# Patient Record
Sex: Male | Born: 1945 | Race: Black or African American | Hispanic: No
Health system: Western US, Academic
[De-identification: ages and names within clinical notes are randomized; demographics above are authoritative.]

## PROBLEM LIST (undated history)

## (undated) DIAGNOSIS — R319 Hematuria, unspecified: Secondary | ICD-10-CM

## (undated) DIAGNOSIS — G935 Compression of brain: Secondary | ICD-10-CM

## (undated) DIAGNOSIS — M19042 Primary osteoarthritis, left hand: Secondary | ICD-10-CM

## (undated) DIAGNOSIS — I10 Essential (primary) hypertension: Secondary | ICD-10-CM

## (undated) DIAGNOSIS — J45909 Unspecified asthma, uncomplicated: Secondary | ICD-10-CM

## (undated) DIAGNOSIS — M19041 Primary osteoarthritis, right hand: Secondary | ICD-10-CM

## (undated) DIAGNOSIS — J301 Allergic rhinitis due to pollen: Secondary | ICD-10-CM

## (undated) HISTORY — PX: PR TYMPANOSTOMY GENERAL ANESTHESIA: 69436

## (undated) HISTORY — DX: Primary osteoarthritis, right hand: M19.041

## (undated) HISTORY — PX: ROTATOR CUFF REPAIR: SHX139

## (undated) HISTORY — DX: Primary osteoarthritis, left hand: M19.042

## (undated) HISTORY — PX: BACK SURGERY: SHX140

## (undated) HISTORY — DX: Allergic rhinitis due to pollen: J30.1

## (undated) HISTORY — PX: SPINE SURGERY: SHX786

## (undated) HISTORY — DX: Compression of brain: G93.5

## (undated) NOTE — *Deleted (*Deleted)
Denies pain assisted to BR x3 with staff and the assistance of a gait belt, unsteady gait noted, Patient verbalize frustration with a system that takes "all his 'dignity away even when he doesn't want them to" His thoughts are confusing and rambling on current and pass occurrences that he experience throughout life,then replies no one will listen to him".Reassurance provided and support. . Patient informed me that he has nor had a bowel movement in 8-9 days and request something for this concern,writer did inform this patient that it is recorded a bowel moment on 04/08/20, but he denies this and po prune juice provided, He was eventually assisted back to bed with the assistance of staff x2( assigned nurse and NT),HOB elevated for his comfort, touch call bell placed on his chest per request, and TV turned on per his request, SR up x3,  0150 Patient assisted x2 back to BR

---

## 2008-09-30 ENCOUNTER — Observation Stay (HOSPITAL_COMMUNITY): Admission: EM | Admit: 2008-09-30 | Discharge: 2008-10-02 | Payer: Self-pay | Admitting: Emergency Medicine

## 2008-09-30 IMAGING — CR DG HAND COMPLETE 3+V*L*
3 series · 3 of 3 positions shown · non-contrast
Comparison: None.

CLINICAL DATA: Injury to hand while using lawnmower.  Fever.

LEFT HAND - COMPLETE 3+ VIEW

[view not recorded (1 of 3)]
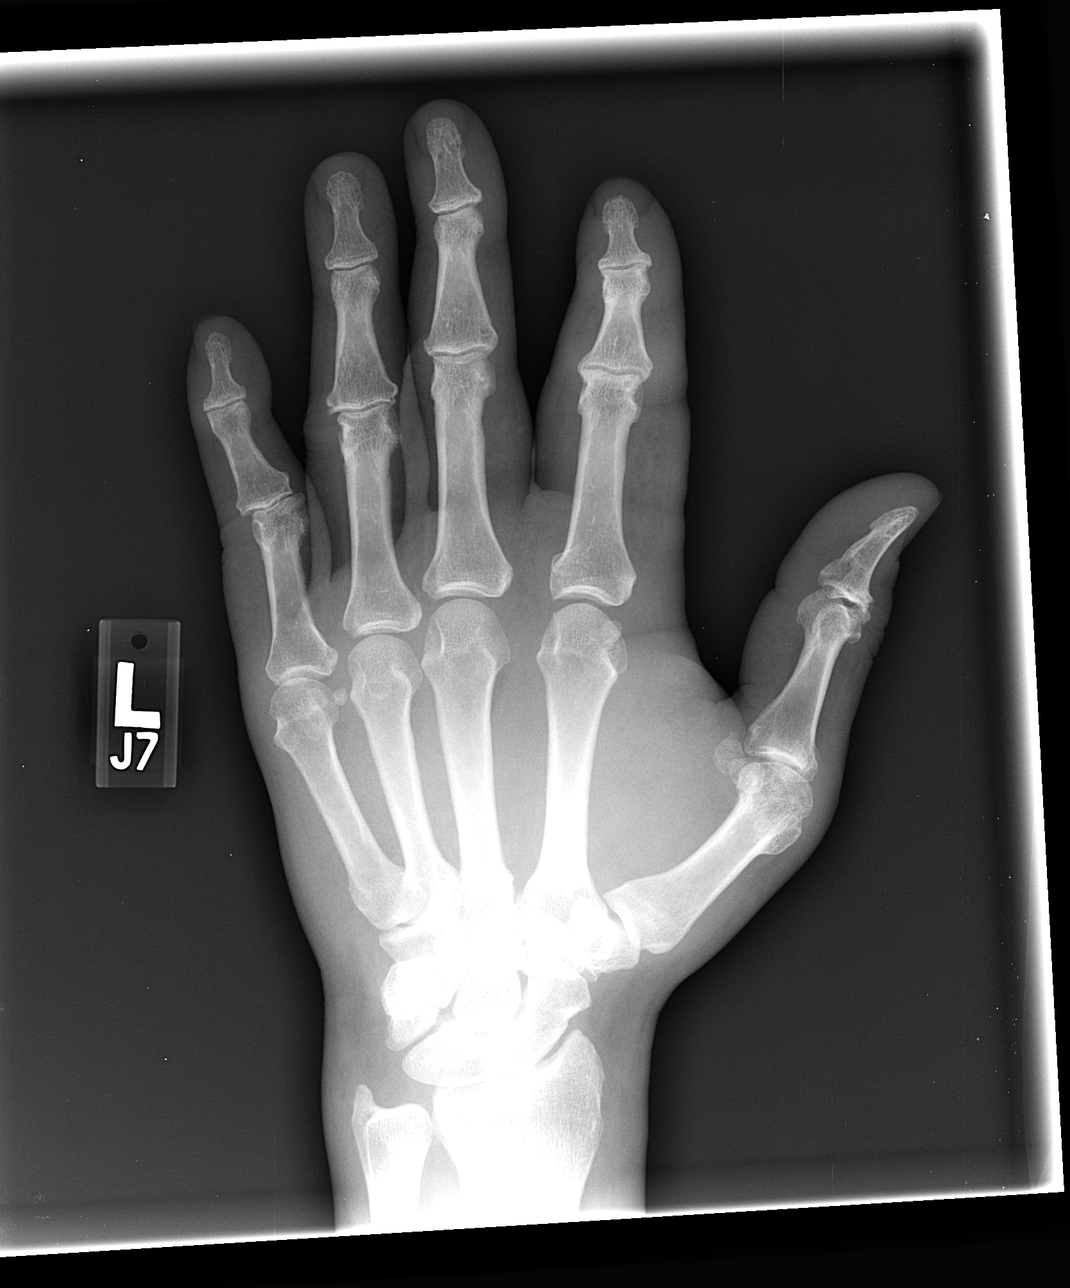

[view not recorded (2 of 3)]
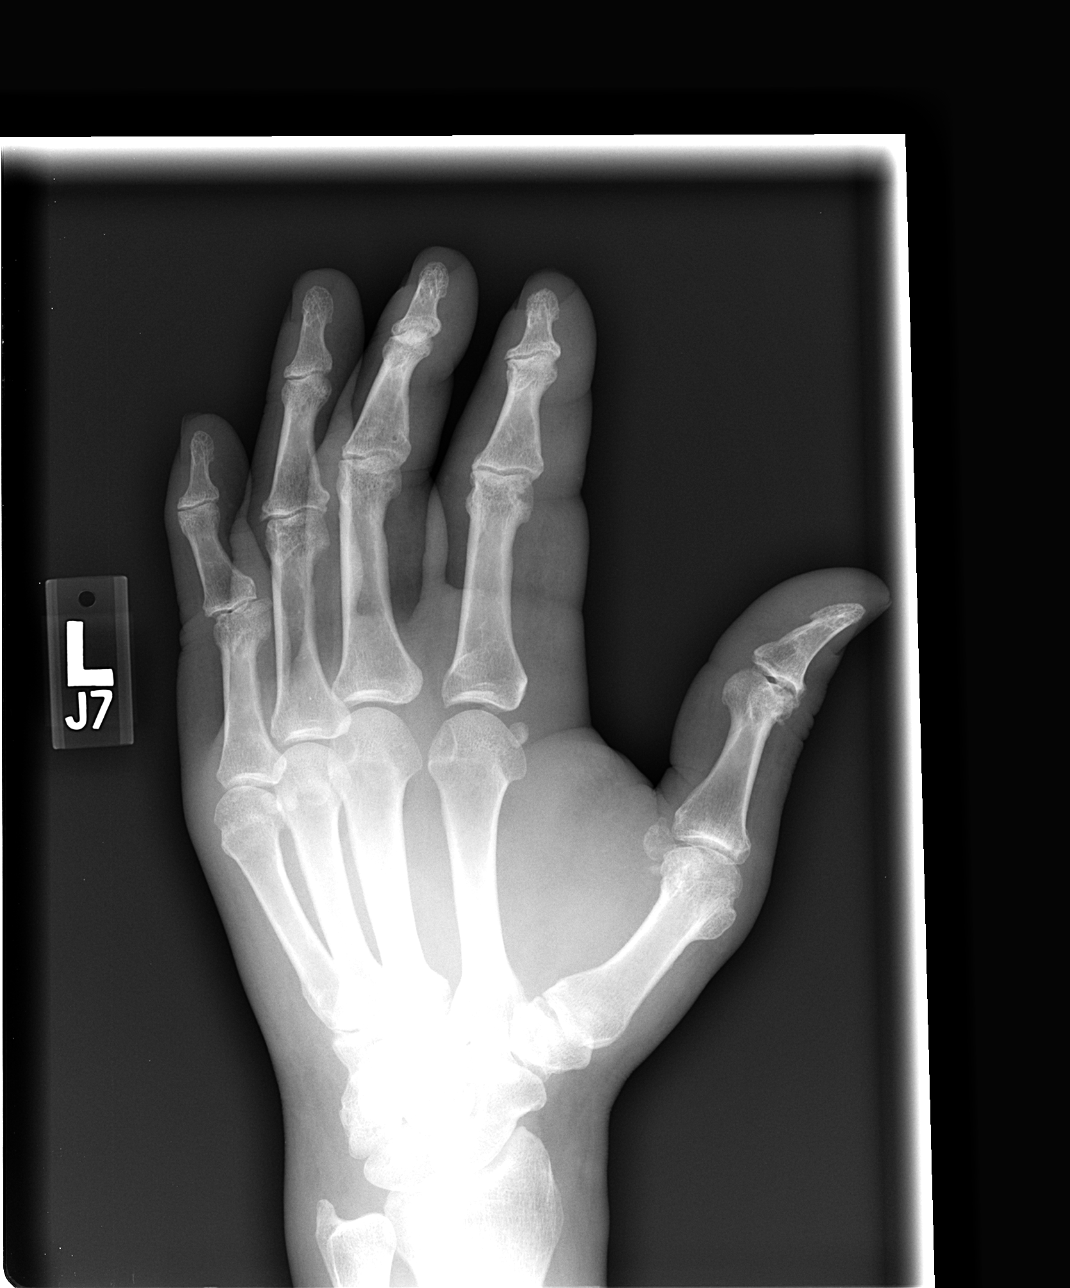

[view not recorded (3 of 3)]
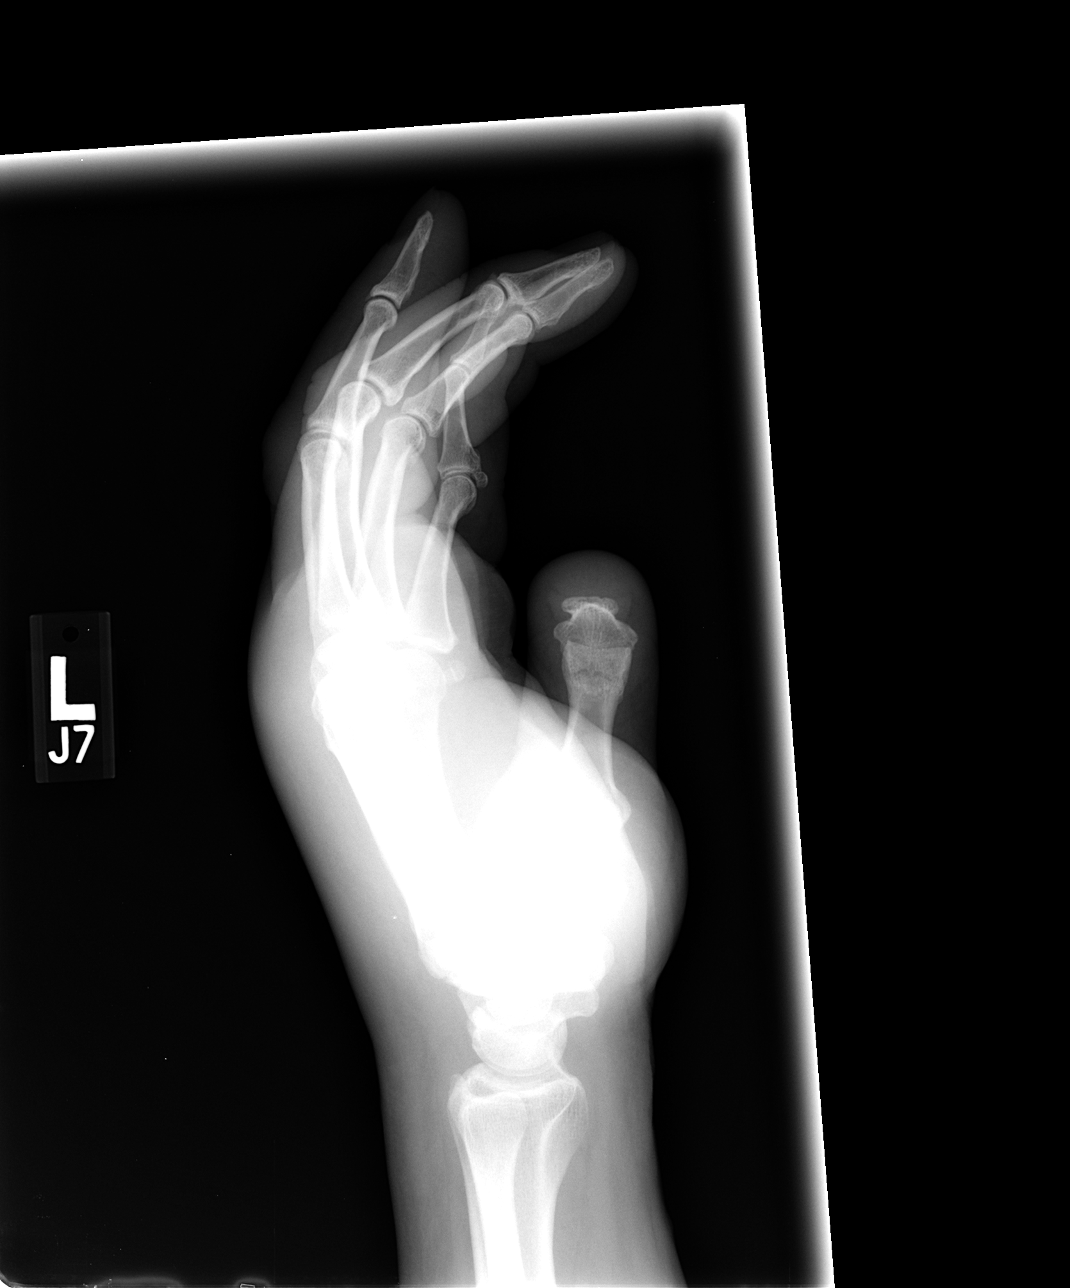

[3 of 3 positions shown; findings below may reference images not displayed]

FINDINGS: Prominent soft tissue swelling dorsum of the hand and
left second digit.  No radiopaque foreign body (radiopaque
structures on various films appear to be artifact and did not
persist on the old films).  Minimal cortical irregularity at the
base of the left second proximal phalanx may represent bony spur
rather than fracture.  If the patient were tender here, a subtle
fracture could not be excluded. Scattered degenerative changes.
IMPRESSION: Prominent soft tissue swelling consistent with cellulitis without
plain film evidence of osteomyelitis.

Subtle cortical irregularity base of the left second proximal
phalanx may represent bony spur rather than fracture.  Question the
patient is tender here?

## 2008-09-30 IMAGING — CR DG CHEST 1V
1 series · 1 of 1 positions shown · non-contrast
Comparison: None.

CLINICAL DATA: Preoperative exam. Hand injury.

CHEST - 1 VIEW

[view not recorded]
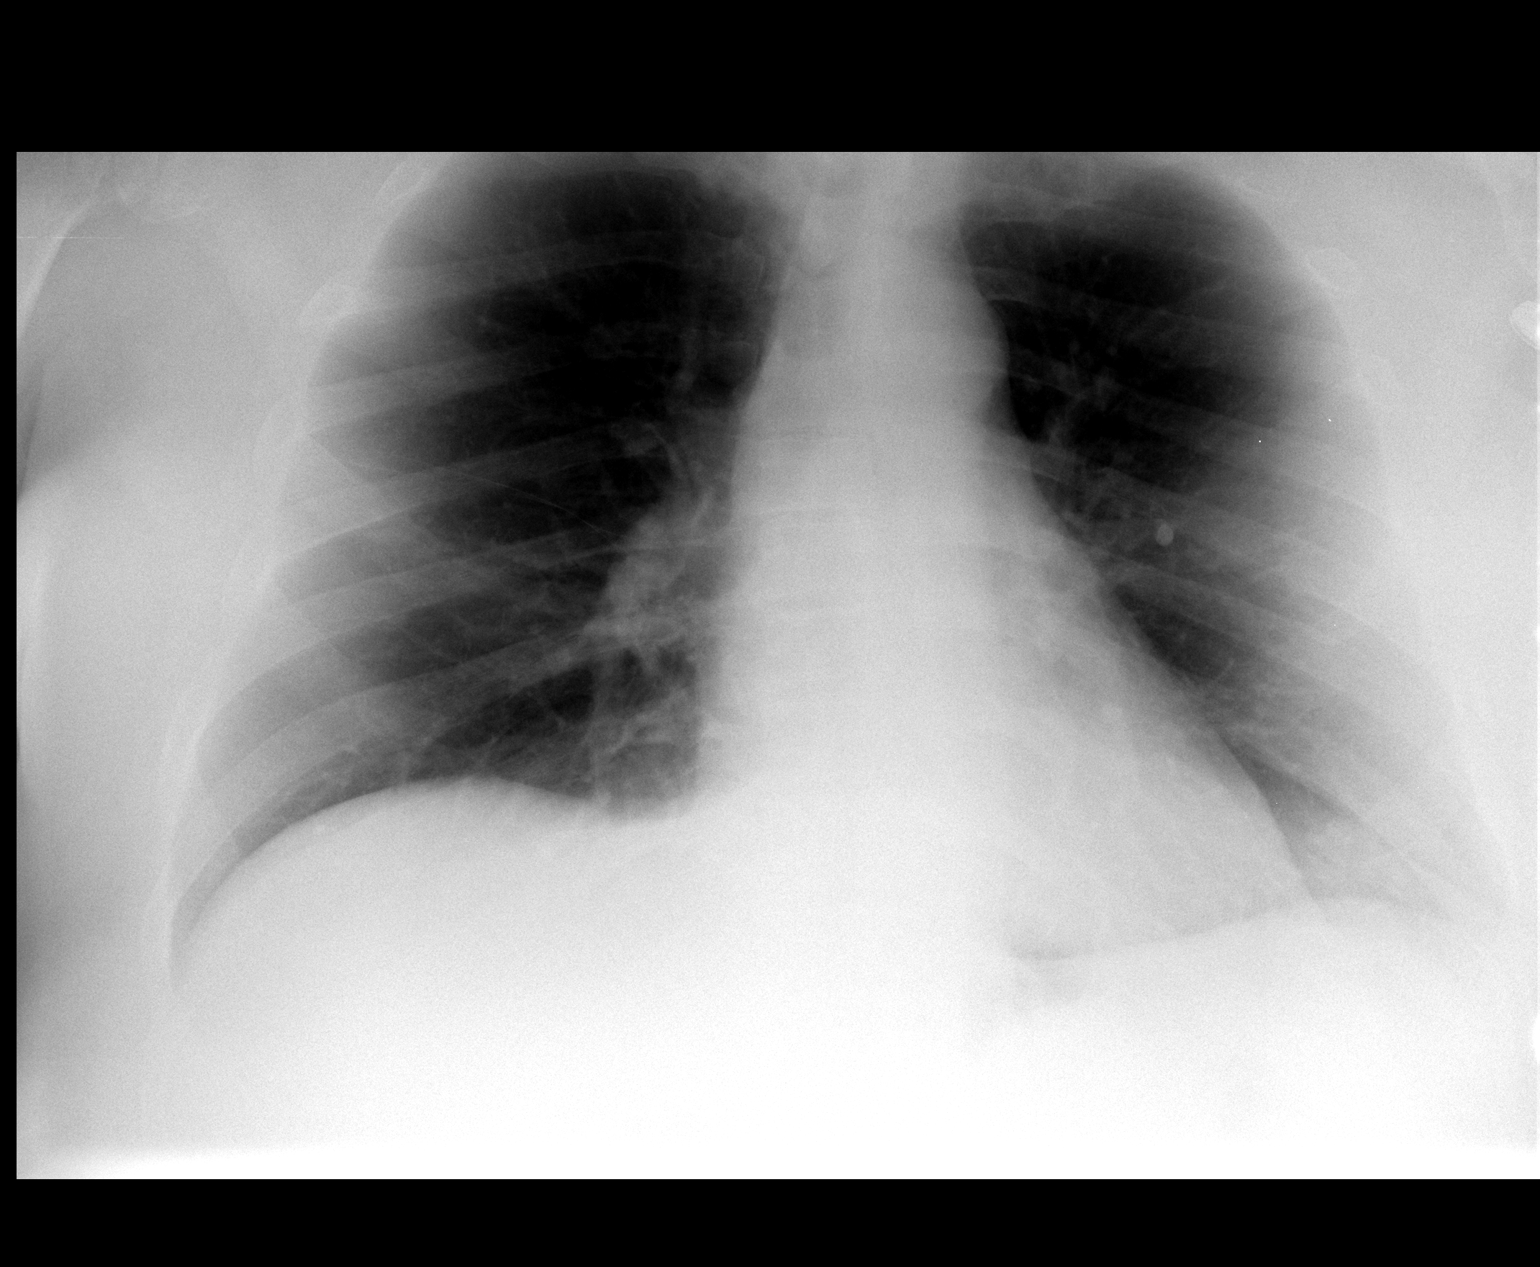

[1 of 1 positions shown; findings below may reference images not displayed]

FINDINGS: Exam limited by portable technique and patient's habitus.
Left base opacity may represent atelectasis or prominent fat pad
and can be evaluated on follow-up to exclude underlying infiltrate
or mass.  Otherwise no infiltrate, congestive heart failure or
pneumothorax.  Heart size within normal limits.
IMPRESSION: Opacity left base.  It is possible this represents confluence of
shadows, atelectasis or prominent fat pad but will require follow-
up to exclude underlying mass or infiltrate.

## 2010-06-24 HISTORY — PX: INCISION AND DRAINAGE: SHX5863

## 2010-10-03 LAB — DIFFERENTIAL
Basophils Absolute: 0 10*3/uL (ref 0.0–0.1)
Basophils Relative: 0 % (ref 0–1)
Eosinophils Absolute: 0 10*3/uL (ref 0.0–0.7)
Eosinophils Relative: 0 % (ref 0–5)
Lymphocytes Relative: 5 % — ABNORMAL LOW (ref 12–46)
Lymphs Abs: 0.7 10*3/uL (ref 0.7–4.0)
Monocytes Absolute: 1.5 10*3/uL — ABNORMAL HIGH (ref 0.1–1.0)
Monocytes Relative: 11 % (ref 3–12)
Neutro Abs: 11.8 10*3/uL — ABNORMAL HIGH (ref 1.7–7.7)
Neutrophils Relative %: 84 % — ABNORMAL HIGH (ref 43–77)

## 2010-10-03 LAB — BASIC METABOLIC PANEL
BUN: 12 mg/dL (ref 6–23)
CO2: 18 mEq/L — ABNORMAL LOW (ref 19–32)
Calcium: 8.6 mg/dL (ref 8.4–10.5)
Chloride: 106 mEq/L (ref 96–112)
Creatinine, Ser: 1.2 mg/dL (ref 0.4–1.5)
GFR calc Af Amer: >60 mL/min (ref >60)
GFR calc non Af Amer: >60 mL/min (ref >60)
Glucose, Bld: 145 mg/dL — ABNORMAL HIGH (ref 70–99)
Potassium: 3.9 mEq/L (ref 3.5–5.1)
Sodium: 134 mEq/L — ABNORMAL LOW (ref 135–145)

## 2010-10-03 LAB — WOUND CULTURE

## 2010-10-03 LAB — GRAM STAIN

## 2010-10-03 LAB — ANAEROBIC CULTURE

## 2010-10-03 LAB — CULTURE, BLOOD (ROUTINE X 2)
Culture: NO GROWTH
Culture: NO GROWTH

## 2010-10-03 LAB — CBC
HCT: 45.1 % (ref 39.0–52.0)
Hemoglobin: 15.4 g/dL (ref 13.0–17.0)
MCHC: 34.1 g/dL (ref 30.0–36.0)
MCV: 83.2 fL (ref 78.0–100.0)
Platelets: 147 10*3/uL — ABNORMAL LOW (ref 150–400)
RBC: 5.43 MIL/uL (ref 4.22–5.81)
RDW: 13.3 % (ref 11.5–15.5)
WBC: 13.9 10*3/uL — ABNORMAL HIGH (ref 4.0–10.5)

## 2010-11-06 NOTE — Discharge Summary (Signed)
Shannon Shannon [name], Shannon [name]                ACCOUNT NO.:  [id number]   MEDICAL RECORD NO.:  [id number]          PATIENT TYPE:  INP   LOCATION:  5118                         FACILITY:  [facility]   PHYSICIAN:  Johnette Abraham, MD    DATE OF BIRTH:  Sep 09, 1945   DATE OF ADMISSION:  09/30/2008  DATE OF DISCHARGE:  10/02/2008                               DISCHARGE SUMMARY   ADMITTING DIAGNOSIS:  Infection of the left index finger and left hand.   TREATMENT:  On September 30, 2008, the patient underwent incision and  drainage of infection of the left index finger and left hand.   DISCHARGE CONDITION:  Stable.   DISCHARGE MEDICATIONS:  Bactrim double strength 1 tablet p.o. b.i.d. for  7 days.   REASON FOR HOSPITALIZATION AND HOSPITAL COURSE:  Shannon Shannon [name] is a  pleasant gentleman who cut his left index finger on the lawn mower on  Wednesday of the current week.  He presented to the emergency department  on Friday with signs and symptoms of severe swollen hand and evidence of  infection of the left index finger and hand.  He was taken to the  operating room where incision and drainage was performed.  Please see  the operative note for details.  Postoperatively, he was admitted to the  floor for IV antibiotics and wound care.  He underwent dressing changes  with irrigation of the wound and packing with iodoform gauze.  On the  first postoperative day, the hand was still quite edematous with  evidence of continued cellulitis and some purulent drainage.  Dressing  changes were continued.  Over the second postoperative day, he remained  afebrile.  His swelling and erythema had markedly regressed with  elevation, IV antibiotics, and wound care.  Of note, he was placed on  Zosyn for broad-spectrum coverage until cultures had been clearly  isolated and identified.  Initial culture result showed Gram-positive  cocci in pairs and clusters.  Subsequent microbiology showed staph  aureus.  At the time of discharge,  it was unclear whether this was  sensitive or resistant staph aureus.  On the day of discharge, he was  afebrile.  His vitals were stable.  He had complained of no pain in the  left hand and index finger.  Upon evaluating the wound, the distal  incision was nearly closed with no evidence of cellulitis.  The incision  over the volar PIP area still showed some signs of drainage and some  erythema.  The more proximal incision overlying the palm showed  regression of erythema and swelling with minimal drainage.  All wounds  were cleansed and irrigated and repacked with iodoform gauze.  The  patient was given explicit directions for wound care to allow him to be  discharged.   DISCHARGE INFORMATION:  The patient will irrigate the wound with normal  saline solution and use half strength hydrogen peroxide at least twice a  day.  He will then pack each wound with a very small amount of iodoform  gauze and cover the wound and keep it covered.  He should also  elevate  the extremity to help with the swelling.  He is to maintain vigilance  when evaluating the wound.  Further erythema, warmth, increase and  drainage should warrant a prompt call to my office.  He is given a  prescription for Bactrim double strength 1 tablet p.o. b.i.d. to take.  He will be discharged after his second dose of IV Zosyn today.  He is to  continue a heart healthy diet.  He states that he has essentially no  hand pain and therefore will continue his Aleve for pain control as he  was on  previously.  He is not currently taking any other medications.  Activity  is as tolerated with elevation of the left upper extremity and wound  care.  I will see him in the office the first of next week.  He is given  instructions to call for a specific time.      Johnette Abraham, MD  Electronically Signed     HCC/MEDQ  D:  10/02/2008  T:  10/03/2008  Job:  [id number]

## 2010-11-06 NOTE — Op Note (Signed)
NAMEJORGELUIS, Shannon [name]                ACCOUNT NO.:  [id number]   MEDICAL RECORD NO.:  [id number]          PATIENT TYPE:  INP   LOCATION:  2550                         FACILITY:  [facility]   PHYSICIAN:  Johnette Abraham, MD    DATE OF BIRTH:  Apr 01, 1946   DATE OF PROCEDURE:  09/30/2008  DATE OF DISCHARGE:                               OPERATIVE REPORT   PREOPERATIVE DIAGNOSIS:  Infection, left index finger and left hand,  flexor tenosynovitis.   POSTOPERATIVE DIAGNOSIS:  Infection, left index finger and left hand,  flexor tenosynovitis.   PROCEDURE:  Incision and drainage of a complex infection of the left  index finger and left palm, packing with iodoform gauze.   ANESTHESIA:  General.   ESTIMATED BLOOD LOSS:  Minimal.   TOURNIQUET TIME:  Approximately 30 minutes.  No acute complications.   INDICATIONS:  [name] is a 65 year old gentleman who lacerated his  hand on a lawn mower on Wednesday.  His hand became aggressively more  painful, swollen.  He presented to the emergency department today with  signs and symptoms of an acute severe infection of his left index finger  and hand.  I was consulted for definitive treatment.  On evaluation, he  had evidence of flexor tenosynovitis with an acutely severely swollen  left index finger extending down the thenar eminence and to the palm and  over the dorsal aspect of the index finger, left hand.  Risks, benefits,  and alternatives of surgery were discussed with the patient including  further infection, loss of digit and extremity, altered sensation, and  altered range of motion.  He agreed to proceed.  Consent was obtained.   PROCEDURE:  The patient was taken to the operating room, placed supine  on the operating room table.  General anesthesia was administered.  The  left hand which was the correct hand was prepped and draped in normal  sterile fashion.  A tourniquet was used.  The arm was elevated for  approximately 2 minutes.   Tourniquet was inflated to 250 mmHg.  A zigzag  incision was made overlying a laceration that was in the PIP flexion  crease, and there was gross purulence prior to incision.  Once this  incision was made, the wound was cultured with aerobic and anaerobic  cultures.  The wound was down to the flexor tendon sheath.  No tendon  laceration was evident; however, there was a degree of purulence.  A  more proximal incision overlying the A1 pulley was then made as well as  a distal incision at the flexor tendon insertion on the distal phalanx  with an angiocatheter of the flexor tendon sheath and subcutaneous  tissues were undermined and irrigated thoroughly.  The carpal tunnel was  milked to see if there was any evidence of extension into the carpal  tunnel and there was no pus with this maneuver.  Further irrigation was  then performed.  There was a small bulla on the dorsal aspect of the  first phalanx of the left index finger.  An incision was made over this  and  this was just a inflamed tissue.  No gross purulence.  Afterwards,  the dorsal lateral incision on the index finger was closed with  interrupted 5-0 nylon sutures.  Iodoform gauze 4-inch was used to pack  both the distal, the  middle, and the proximal incisions.  The tourniquet was released.  The  fingertip returned to nice pink color.  There was still some bruising  along the entire aspect in the middle phalanx proximally.  A sterile  dressing was then placed.  The patient awoke from anesthesia, tolerated  the procedure well.      Johnette Abraham, MD  Electronically Signed     HCC/MEDQ  D:  09/30/2008  T:  10/01/2008  Job:  [id number]

## 2010-11-09 NOTE — Discharge Summary (Signed)
NAME:  Shannon [name], KRONE                ACCOUNT NO.:  [id number]   MEDICAL RECORD NO.:  [id number]          PATIENT TYPE:  OBV   LOCATION:  5118                         FACILITY:  [facility]   PHYSICIAN:  Johnette Abraham, MD    DATE OF BIRTH:  05-26-1946   DATE OF ADMISSION:  09/30/2008  DATE OF DISCHARGE:  10/02/2008                               DISCHARGE SUMMARY   ADDENDUM   FINAL DISCHARGE DIAGNOSIS:  Flexor tenosynovitis of the left index  finger and deep infection of the left hand.      Johnette Abraham, MD  Electronically Signed     HCC/MEDQ  D:  11/08/2008  T:  11/08/2008  Job:  [id number]

## 2012-07-01 ENCOUNTER — Ambulatory Visit: Payer: Medicare Other | Attending: Otolaryngology | Admitting: Otolaryngology

## 2012-07-01 ENCOUNTER — Ambulatory Visit: Payer: Medicare Other | Attending: Otolaryngology | Admitting: Speech-Language Pathologist

## 2012-07-01 DIAGNOSIS — J3801 Paralysis of vocal cords and larynx, unilateral: Secondary | ICD-10-CM | POA: Insufficient documentation

## 2012-07-01 DIAGNOSIS — J384 Edema of larynx: Secondary | ICD-10-CM | POA: Insufficient documentation

## 2012-07-01 DIAGNOSIS — R1313 Dysphagia, pharyngeal phase: Secondary | ICD-10-CM | POA: Insufficient documentation

## 2012-07-01 DIAGNOSIS — R490 Dysphonia: Secondary | ICD-10-CM | POA: Insufficient documentation

## 2012-07-01 DIAGNOSIS — IMO0001 Reserved for inherently not codable concepts without codable children: Secondary | ICD-10-CM | POA: Insufficient documentation

## 2012-07-02 ENCOUNTER — Encounter (HOSPITAL_BASED_OUTPATIENT_CLINIC_OR_DEPARTMENT_OTHER): Payer: TRICARE For Life (TFL) | Admitting: Otolaryngology

## 2012-08-04 ENCOUNTER — Ambulatory Visit: Payer: Medicare Other | Attending: Otolaryngology | Admitting: Speech-Language Pathologist

## 2012-08-04 ENCOUNTER — Ambulatory Visit (HOSPITAL_BASED_OUTPATIENT_CLINIC_OR_DEPARTMENT_OTHER): Payer: Medicare Other | Admitting: Speech-Language Pathologist

## 2012-08-04 ENCOUNTER — Other Ambulatory Visit: Payer: Self-pay | Admitting: Otolaryngology

## 2012-08-04 ENCOUNTER — Ambulatory Visit: Payer: Medicare Other | Attending: Otolaryngology

## 2012-08-04 DIAGNOSIS — R490 Dysphonia: Secondary | ICD-10-CM | POA: Insufficient documentation

## 2012-08-04 DIAGNOSIS — I69991 Dysphagia following unspecified cerebrovascular disease: Secondary | ICD-10-CM | POA: Insufficient documentation

## 2012-08-04 DIAGNOSIS — J384 Edema of larynx: Secondary | ICD-10-CM | POA: Insufficient documentation

## 2012-08-04 DIAGNOSIS — J3801 Paralysis of vocal cords and larynx, unilateral: Secondary | ICD-10-CM | POA: Insufficient documentation

## 2012-08-04 DIAGNOSIS — K219 Gastro-esophageal reflux disease without esophagitis: Secondary | ICD-10-CM | POA: Insufficient documentation

## 2012-08-04 DIAGNOSIS — IMO0001 Reserved for inherently not codable concepts without codable children: Secondary | ICD-10-CM | POA: Insufficient documentation

## 2012-08-04 DIAGNOSIS — R1314 Dysphagia, pharyngoesophageal phase: Secondary | ICD-10-CM | POA: Insufficient documentation

## 2012-08-04 LAB — PR RADIOLOGIC EXAM SWALLOW FUNCTION CONTRAST STUDY

## 2012-08-12 ENCOUNTER — Ambulatory Visit (HOSPITAL_BASED_OUTPATIENT_CLINIC_OR_DEPARTMENT_OTHER): Payer: Medicare Other | Admitting: Speech-Language Pathologist

## 2012-08-25 ENCOUNTER — Ambulatory Visit: Payer: Medicare Other | Attending: Otolaryngology | Admitting: Speech-Language Pathologist

## 2012-08-25 DIAGNOSIS — R490 Dysphonia: Secondary | ICD-10-CM | POA: Insufficient documentation

## 2012-08-25 DIAGNOSIS — IMO0001 Reserved for inherently not codable concepts without codable children: Secondary | ICD-10-CM | POA: Insufficient documentation

## 2012-08-25 DIAGNOSIS — J3801 Paralysis of vocal cords and larynx, unilateral: Secondary | ICD-10-CM | POA: Insufficient documentation

## 2012-08-25 DIAGNOSIS — R1313 Dysphagia, pharyngeal phase: Secondary | ICD-10-CM | POA: Insufficient documentation

## 2012-08-25 DIAGNOSIS — J384 Edema of larynx: Secondary | ICD-10-CM | POA: Insufficient documentation

## 2012-10-15 ENCOUNTER — Ambulatory Visit: Payer: Medicare Other | Attending: Otolaryngology | Admitting: Speech-Language Pathologist

## 2012-10-15 ENCOUNTER — Ambulatory Visit: Payer: Medicare Other | Attending: Otolaryngology | Admitting: Otolaryngology

## 2012-10-15 DIAGNOSIS — J3801 Paralysis of vocal cords and larynx, unilateral: Secondary | ICD-10-CM | POA: Insufficient documentation

## 2012-10-15 DIAGNOSIS — R1314 Dysphagia, pharyngoesophageal phase: Secondary | ICD-10-CM | POA: Insufficient documentation

## 2012-10-15 DIAGNOSIS — J3802 Paralysis of vocal cords and larynx, bilateral: Secondary | ICD-10-CM | POA: Insufficient documentation

## 2012-10-15 DIAGNOSIS — IMO0001 Reserved for inherently not codable concepts without codable children: Secondary | ICD-10-CM | POA: Insufficient documentation

## 2012-10-15 DIAGNOSIS — R490 Dysphonia: Secondary | ICD-10-CM | POA: Insufficient documentation

## 2012-10-15 DIAGNOSIS — J384 Edema of larynx: Secondary | ICD-10-CM | POA: Insufficient documentation

## 2012-10-15 DIAGNOSIS — J383 Other diseases of vocal cords: Secondary | ICD-10-CM | POA: Insufficient documentation

## 2013-01-01 ENCOUNTER — Ambulatory Visit: Payer: Medicare Other | Attending: Otolaryngology | Admitting: Otolaryngology

## 2013-01-01 ENCOUNTER — Encounter (HOSPITAL_BASED_OUTPATIENT_CLINIC_OR_DEPARTMENT_OTHER): Payer: Self-pay | Admitting: Otolaryngology

## 2013-01-01 ENCOUNTER — Encounter (HOSPITAL_BASED_OUTPATIENT_CLINIC_OR_DEPARTMENT_OTHER): Payer: Medicare Other | Admitting: Speech-Language Pathologist

## 2013-01-01 VITALS — BP 135/97 | HR 64 | Ht 73.0 in | Wt 226.0 lb

## 2013-01-01 DIAGNOSIS — J387 Other diseases of larynx: Secondary | ICD-10-CM | POA: Insufficient documentation

## 2013-01-01 DIAGNOSIS — J383 Other diseases of vocal cords: Secondary | ICD-10-CM | POA: Insufficient documentation

## 2013-01-01 NOTE — Progress Notes (Signed)
CHIEF COMPLAINT     Chief Complaint   Patient presents with   . Laser Treatment     [facility] per [facility]       SUBJECTIVE     [name] returns to clinic today for follow-up of the white lesion on his epiglottis as well as his hoarseness.  He reports doing well, with no significant changes since last visit.  He has had not changes to his voice, breathing or swallowing function.  His wife states he has some difficulty eating popcorn.  Otherwise, he has no problems with a regular diet.  He denies any symptoms of heartburn and is not on any acid reflux medication.      OBJECTIVE    VITAL SIGNS: BP 135/97  Pulse 64  Ht 6\' 1"  (1.854 m)  Wt 226 lb (102.513 kg)  BMI 29.82 kg/m2  GENERAL:  Very pleasant, no acute distress.  RESP: no stridor, normal respirations.  VOICE: low, rapsy quality to voice.  No pitch breaks with normal phonation.  Oral cavity negative.  Neck symmetric without lymph node salivary masses.     PROCEDURE     Flexible laryngoscopy with stroboscopy    SURGEON     Joel Gibney, MD  Joel Heckle, MD    FINDINGS     1. 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 Subtle paresis of L TVC with 2mm glottic gap with anteriorly impairing mucosal wave.  No vibratory lesions per se.  3. No lesions in valeculla or pyriform sinuses    INDICATIONS     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     DESCRIPTION OF PROCEDURE     After verbal consent was obtained, the right nare was sprayed with Afrin and lidocaine.  The flexible endoscope was then used for visualization and passed from the nostrils into the nasopharynx and larynx.  The above-stated findings were seen.  He tolerated the procedure well.    ASSESSMENT AND PLAN     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  Today, it appears more flat and  similar in size to his last visit.  While these characteristics are less concerning for malignancy, the lesion warrants a biopsy.      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  This would benefit from vocal cord augmention.    We discussed performing this procedure in-office versus in the operating room.  Given patient comfort and ease of performing procedures, the patient opted to undergo the procedures in the operating room under sedation, or general anesthesia as needed.         [facility] ATTENDING NOTE  I, Joel Parker, saw and evaluated this patient the day of their clinic visit. I have read and reviewed Dr. Merian Parker note and agree.  I was present for the entire endoscopic procedure.    I have personally reviewed the history with the patient as well as going over the working diagnoses and management options.  I have answered the patient's questions to the best of my ability.

## 2013-01-05 ENCOUNTER — Ambulatory Visit (HOSPITAL_BASED_OUTPATIENT_CLINIC_OR_DEPARTMENT_OTHER): Payer: Medicare Other

## 2013-01-05 ENCOUNTER — Ambulatory Visit: Payer: Medicare Other | Attending: Physician Assistant | Admitting: Physician Assistant

## 2013-01-05 VITALS — BP 118/76 | HR 76 | Wt 226.0 lb

## 2013-01-05 DIAGNOSIS — R220 Localized swelling, mass and lump, head: Secondary | ICD-10-CM | POA: Insufficient documentation

## 2013-01-05 DIAGNOSIS — R221 Localized swelling, mass and lump, neck: Secondary | ICD-10-CM

## 2013-01-07 ENCOUNTER — Encounter (HOSPITAL_BASED_OUTPATIENT_CLINIC_OR_DEPARTMENT_OTHER): Payer: Self-pay | Admitting: Physician Assistant

## 2013-01-07 DIAGNOSIS — I639 Cerebral infarction, unspecified: Secondary | ICD-10-CM | POA: Insufficient documentation

## 2013-01-07 NOTE — Progress Notes (Signed)
Patient Referred By: No ref. 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 Erasmo Score clinic note of January 01, 2013.  Per patient's wife he has voice fatigue and it is harder to understand his speech later in the day.    Review of Systems   Constitutional: Negative.    HENT: Positive for voice change.    Eyes: Negative for pain.   Respiratory: Positive for apnea. Negative for shortness of breath.    Cardiovascular: Negative for chest pain.   Gastrointestinal: Negative for abdominal pain.   Endocrine: Negative.    Genitourinary: Negative for dysuria.   Musculoskeletal: Negative.    Skin: Negative.    Neurological: Negative.    Psychiatric/Behavioral: Negative.          Objective:  Physical Exam   Vitals reviewed.  Constitutional: He is oriented to person, place, and time. He appears well-developed and well-nourished. No distress.   HENT:   Head: Normocephalic and atraumatic.   Mouth/Throat: Oropharynx is clear and moist.   Eyes: Conjunctivae and EOM are normal. Pupils are equal, round, and reactive to light.   Neck: Normal range of motion. Neck supple.   Cardiovascular: Normal rate and regular rhythm.    Pulmonary/Chest: Effort normal and breath sounds normal.   Abdominal: Soft. Bowel sounds are normal.   Musculoskeletal: He exhibits no edema.   Neurological: He is alert and oriented to person, place, and time. No cranial nerve deficit.   Skin: Skin is warm and dry.   Psychiatric: He has a normal mood and affect.        Assessment and Plan:  Vocal cord paresis  1) Flexible laryngoscopy, possible direct laryngoscopy with biopsy  2) Laser treatment  3) Bilateral vocal cord augmentation

## 2013-01-08 ENCOUNTER — Ambulatory Visit
Admission: RE | Admit: 2013-01-08 | Discharge: 2013-01-08 | Disposition: A | Discharge status: Home / Self Care | Payer: Medicare Other | Attending: Otolaryngology | Admitting: Otolaryngology

## 2013-01-08 ENCOUNTER — Other Ambulatory Visit (HOSPITAL_BASED_OUTPATIENT_CLINIC_OR_DEPARTMENT_OTHER): Payer: Self-pay | Admitting: Otolaryngology

## 2013-01-08 ENCOUNTER — Telehealth (HOSPITAL_BASED_OUTPATIENT_CLINIC_OR_DEPARTMENT_OTHER): Payer: Self-pay | Admitting: Otolaryngology

## 2013-01-08 ENCOUNTER — Ambulatory Visit (HOSPITAL_BASED_OUTPATIENT_CLINIC_OR_DEPARTMENT_OTHER): Payer: Medicare Other | Admitting: Otolaryngology

## 2013-01-08 DIAGNOSIS — I69928 Other speech and language deficits following unspecified cerebrovascular disease: Secondary | ICD-10-CM | POA: Insufficient documentation

## 2013-01-08 DIAGNOSIS — D141 Benign neoplasm of larynx: Secondary | ICD-10-CM | POA: Insufficient documentation

## 2013-01-08 DIAGNOSIS — J3801 Paralysis of vocal cords and larynx, unilateral: Secondary | ICD-10-CM | POA: Insufficient documentation

## 2013-01-08 NOTE — Telephone Encounter (Signed)
CONFIRMED PHONE NUMBER: [phone]  CALLERS FIRST AND LAST NAME: Raonall Stonerock  FACILITY NAME: n/a TITLE: n/a  CALLERS RELATIONSHIP:ChildSon  RETURN CALL: General message OK     SUBJECT: Letter/Form Request   REASON FOR REQUEST: Needs letter for employer that patient needs to be on voice rest for 1 week     WHAT IS THE LETTER/FORM FOR: Return to work; date out had surgery 7/18  And note needs to be for 7/21-7/28 date back 7/28  WHO SHOULD FILL IT OUT: Dr Uvaldo Bristle  WHERE SHOULD IT BE SENT: Please send to patient's home address [id number] [place] [address]  DATE NEEDED BY: Needs to be sent out today

## 2013-01-08 NOTE — Telephone Encounter (Signed)
I have routed this to [employer] for assistance.

## 2013-01-08 NOTE — Telephone Encounter (Signed)
CONFIRMED PHONE NUMBER: [phone]  CALLERS FIRST AND LAST NAME: Marguerit Katrinka Blazing  FACILITY NAME: na TITLE: na  CALLERS RELATIONSHIP:Spouse  RETURN CALL: Detailed message on voicemail only     SUBJECT: General Message   REASON FOR REQUEST: Wife calling with questions about patient's blood pressure medication. Stopped Valsartan (DIOVAN) 80 MG Oral Tab before the surgery, which was today 7/18. Wants to know if he can start taking that again tomorrow, one day after the surgery, or if he needs to wait 7 days after the surgery to start taking that again. Please call patient or wife to advise. Thank you.     MESSAGE: above.

## 2013-01-08 NOTE — Telephone Encounter (Signed)
I have routed this to Heather RN

## 2013-01-11 NOTE — Telephone Encounter (Signed)
rn called pt re their med concern on 01/08/13.  rn spoke to md weiss who states pt should take his bp w/ his home bp machine, if his bp is under 120/80 then pt should hold med and restart on 01/09/13, but if bp above 120/80 then pt should restart med as sched tonight.  rn relayed this info to pt's wife, Vernona Rieger, who states she understands and will act accordingly.

## 2013-01-12 NOTE — Telephone Encounter (Signed)
Spoke to the pts wife and explained to her that the letter for work was sent to them today in the postal mail.  I also informed her that the bx results are not back as of today and once they are finalized, I would let Dr. Uvaldo Bristle know that they would like a call with the results.  She said that the doctor told her that they would be back by Tuesday (today) and I explained to her that I checked both systems ([facility] and EPIC) and the results were not back from pathology.  As soon as the results come, I told the pts wife that we will have him review them and we will contact them with the results.  She said that would be fine and thanked me for the return call.

## 2013-01-12 NOTE — Telephone Encounter (Signed)
Patients wife is following up on patients letter for work and would like to follow up on patients biopsy results.

## 2013-01-13 ENCOUNTER — Telehealth (HOSPITAL_BASED_OUTPATIENT_CLINIC_OR_DEPARTMENT_OTHER): Payer: Self-pay | Admitting: Otolaryngology

## 2013-01-13 LAB — PATHOLOGY, SURGICAL

## 2013-01-13 NOTE — Telephone Encounter (Signed)
Please review the final results of the bx which are now available in EPIC.  The pt's wife would like a call back regarding the results today and/or tomorrow.  Thank you!

## 2013-01-13 NOTE — Telephone Encounter (Signed)
CONFIRMED PHONE NUMBER: [phone]  CALLERS FIRST AND LAST NAME: Maren Reamer   FACILITY NAME: na TITLE: na  CALLERS RELATIONSHIP:Spouse  RETURN CALL: Detailed message on voicemail only     SUBJECT: Results Request   REASON FOR REQUEST: Patient's wife would like to know the result of the biopsy.  Patient was told it would be ready and would like to receive a call today even if the results are not in to let her know.     WHO ORDERED THE TEST: Dr. Uvaldo Bristle, [place]  TYPE OF TEST? Other Biopsy  WHERE WAS THE TEST DONE: n/a  WHEN WAS THE TEST DONE: n/a

## 2013-01-13 NOTE — Telephone Encounter (Signed)
I checked the EPIC to see if the results are finalized and they were completed today at 1200pm.  I called the pts wife and informed her that we just received the final report and that Dr. Uvaldo Bristle is currently in surgery today.  I will forward her message to him so that he can take a look at the results.  I explained to her that he will have one of the RN's give her a call back with the result information and that he may not be able to review them until tomorrow when he is in clinic.  She thanked me and said she would be awaiting a phone call.

## 2013-01-15 ENCOUNTER — Telehealth (HOSPITAL_BASED_OUTPATIENT_CLINIC_OR_DEPARTMENT_OTHER): Payer: Self-pay

## 2013-01-15 NOTE — Telephone Encounter (Signed)
I have given this to Dr. Uvaldo Bristle for review

## 2013-01-15 NOTE — Telephone Encounter (Signed)
Patient called and given results of his biopsy.

## 2013-01-15 NOTE — Telephone Encounter (Signed)
Patient called back requesting the results. Please return call to [phone]. Detailed message on voicemail ok.     Thank you

## 2013-02-18 ENCOUNTER — Encounter (HOSPITAL_BASED_OUTPATIENT_CLINIC_OR_DEPARTMENT_OTHER): Payer: Medicare Other

## 2013-02-18 ENCOUNTER — Ambulatory Visit: Payer: Medicare Other | Attending: Otolaryngology | Admitting: Otolaryngology

## 2013-02-18 VITALS — BP 123/84 | HR 66 | Ht 73.0 in | Wt 232.0 lb

## 2013-02-18 DIAGNOSIS — R490 Dysphonia: Secondary | ICD-10-CM | POA: Insufficient documentation

## 2013-02-18 DIAGNOSIS — D141 Benign neoplasm of larynx: Secondary | ICD-10-CM | POA: Insufficient documentation

## 2013-02-18 DIAGNOSIS — J3801 Paralysis of vocal cords and larynx, unilateral: Secondary | ICD-10-CM | POA: Insufficient documentation

## 2013-02-18 DIAGNOSIS — J384 Edema of larynx: Secondary | ICD-10-CM | POA: Insufficient documentation

## 2013-02-18 NOTE — Progress Notes (Signed)
See full dictated note.

## 2013-02-19 NOTE — Progress Notes (Signed)
Joel Parker Parker, Joel Parker          [id number]          02/18/2013      OTOLARYNGOLOGY CLINIC NOTE       DATE OF SERVICE     February 18, 2013.    [name] Joel Parker Parker came in today for follow up of these two issues.  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  He is doing well since surgery. At first I thought his voice sounded stronger, but the family felt that indeed his voice was a little bit more raspy after the injection.  Nonetheless, he tolerated the he surgery well with no problems.  He denies any dysphagia, odynophagia, hemoptysis, or weight loss. He has nothing to report to me from a GI, pulmonary, cardiac, or neurologic review of systems.    PHYSICAL EXAMINATION     He is in no acute distress. The voice sounds good when he puts a little breath behind it.  Otherwise, it can sound raspy at times. He has no pain or stridor. On examination, the patient is in no acute distress. Face is symmetric. The patient is alert and oriented x3. Skin tone is good. Cranial nerves II-XII are intact except as noted below. Ears are negative; nose is negative; lips, teeth, and gums are negative. Oral cavity and oropharynx are negative. The neck is symmetric without any lymph node, salivary, or thyroid gland masses.     PROCEDURE     Flexible fiberoptic laryngoscopy was performed with videostroboscopy.  This was done transnasally and following topical nasal anesthesia and decongestion.  The nose was traversed.  The nasopharynx, oropharynx, and hypopharynx are clear.  The larynx was notable for no evidence of any epiglottic papilloma. The area where it was is clear and bare and looks nicely healed.  His cords look fine. He does have a little bit of trouble closing with some thinning in the left cord. I really cannot see much of an injection on the  left side.  The limitation of vibration on closure is modest, but I do not see any epithelial or subepithelial disease on videostroboscopy per se and there are lesions or masses.    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 We will keep an eye on it and check it in about 6 months to see if anything recurs.  From a voice standpoint, I think he would benefit from voice therapy.  I have encouraged him to get back in with Joel Parker Joel Parker Parker on our team who has seen him in the past.  If he does feel like he has had some benefit from the injection, it may show more once some time has passed.  I will see him back at that point and certainly he may benefit from medialization if he feels like the injections did indeed help him. In the meanwhile, thank you again for allowing me to participate in this patient's care. If you have any questions regarding this case, or any others that you would like to discuss, please do not hesitate to contact me here at [facility], [phone].

## 2013-04-23 ENCOUNTER — Encounter (HOSPITAL_BASED_OUTPATIENT_CLINIC_OR_DEPARTMENT_OTHER): Payer: Medicare Other | Admitting: Speech-Language Pathologist

## 2013-04-30 ENCOUNTER — Encounter (HOSPITAL_BASED_OUTPATIENT_CLINIC_OR_DEPARTMENT_OTHER): Payer: Medicare Other | Admitting: Speech-Language Pathologist

## 2013-05-07 ENCOUNTER — Encounter (HOSPITAL_BASED_OUTPATIENT_CLINIC_OR_DEPARTMENT_OTHER): Payer: Medicare Other | Admitting: Speech-Language Pathologist

## 2013-08-19 ENCOUNTER — Ambulatory Visit: Payer: Medicare Other | Attending: Otolaryngology | Admitting: Speech-Language Pathologist

## 2013-08-19 ENCOUNTER — Ambulatory Visit: Payer: Medicare Other | Attending: Otolaryngology | Admitting: Otolaryngology

## 2013-08-19 VITALS — BP 133/83 | HR 63 | Ht 73.0 in | Wt 225.0 lb

## 2013-08-19 DIAGNOSIS — I10 Essential (primary) hypertension: Secondary | ICD-10-CM | POA: Insufficient documentation

## 2013-08-19 DIAGNOSIS — IMO0001 Reserved for inherently not codable concepts without codable children: Secondary | ICD-10-CM | POA: Insufficient documentation

## 2013-08-19 DIAGNOSIS — D141 Benign neoplasm of larynx: Secondary | ICD-10-CM

## 2013-08-19 DIAGNOSIS — R490 Dysphonia: Secondary | ICD-10-CM | POA: Insufficient documentation

## 2013-08-19 NOTE — Progress Notes (Signed)
[facility] NOTE      DATE OF SERVICE    08/19/2013    HPI  Mr. 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  These were both done on January 08, 2013.  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 He is doing well since surgery.     He feels that his voice is stronger and more clear most of the time. He does not have to repeat himself as much. His VHI is 9 today.     He denies any dysphagia, odynophagia or dyspnea. He is eating well and in fact gaining weight. He has nothing to report to from a GI, pulmonary, cardiac, or neurologic review of systems.    PHYSICAL EXAMINATION    He is in no acute distress. The voice sounds good and even stronger when he puts a little breath behind it. He has no pain or stridor. On examination, the patient is in no acute distress. Face is symmetric. The patient is alert and oriented x3. Skin tone is good. Cranial nerves II-XII are intact except as noted below. Ears are negative; nose is negative; lips, teeth, and gums are negative. Oral cavity and oropharynx are negative. The neck is symmetric without any lymph node, salivary, or thyroid gland masses.     PROCEDURE    Flexible fiberoptic laryngoscopy was performed with videostroboscopy. This was done transnasally and following topical nasal anesthesia and decongestion. The nose was traversed. The nasopharynx, oropharynx, and hypopharynx are clear. The larynx was notable for no evidence of any epiglottic papilloma. The area where it was is clear and bare and looks nicely healed. His cords look fine. He does have a little bit of trouble closing with some thinning in the left cord and intermittent anterior slit-like incomplete closure. Vibration is symmetric. There are no lesions or masses.    ASSESSMENT AND PLAN    My  impression is that he has done nicely with his papilloma.  His voice quality is good with a VHI 9 in addition to his own perception of his voice and he does not desire any speech therapy at this time. Given how well he has done we would like to see him back in clinic in 6 months and look forward to his return.     In the meanwhile, thank you again for allowing me to participate in this patient's care. If you have any questions regarding this case, or any others that you would like to discuss, please do not hesitate to contact me here at [facility], [phone].       [facility] ATTENDING NOTE  This was a shared/split visit with Dr Kristine Royal, [place] Fellow in Laryngology.  Darcus Austin, saw and evaluated this patient the day of their clinic visit. I have read and reviewed the note from Dr. Lynelle Smoke, Fellow in Laryngology, and agree.  I personally performed videostroboscopy on the pt during this clinic exam.    I have reviewed the history with the patient as well as going over the working diagnoses and management options.  I have answered the patient's questions to the best of my ability.

## 2013-08-19 NOTE — Progress Notes (Signed)
SPEECH LANGUAGE PATHOLOGY/LARYNGOLOGY EVALUATION   AND INITIAL PLAN OF CARE     The following patient identifiers were confirmed today name and date of birth.    Time in: 9:40  Duration: 45 min   Onset Date: 04/03/2012  Start of Care Date: 08/19/2013  Visits from Start of Care:  1  Referring Provider:  [x]  Dr. Freddi Starr,  []  Dr. Cristina Gong  []   Dr. Bjorn Loser  Treatment/Therapy Diagnosis:   Problem List Items Addressed This Visit     Care involving speech-language therapy - Primary     Dysphonia             PERTINENT MEDICAL/SURGICAL HISTORY:   No past medical history on file.    Past Surgical History   Procedure Laterality Date   . 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     [x]  Laryngeal Surgery   []  MBSS [x]  Dysphagia []  Weight loss [] PNA      []  SOB []  Asthma []  Inhalers  []  Reflux symptoms  []  PPIs    Previous interventions: Prior speech therapy with Deland Pretty, SLP, in 2014 prior to surgery and vocal fold augmentation.    Prior Level of Function: Before the onset of the current condition, the patient was able to speak normally.    SUBJECTIVE:     Patient Statement: Pt. reported improved voice since the injection. He reported stronger voice and no vocal fatigue. He uses his voice quite a bit for work and reported that his voice hold up well. He denied SOB and dysphagia. He consumes a regular diet with thin liquid.                      Barriers to learning: none                   Preferred Learning style: demonstration, written and verbal        Cultural Practices that Influence Care: none      Pain Rating/Action Taken/Fall report:  See Patient Self Assessment of Pain and falls in medical record in OTO section from today.    VHI-10 Score 8/40 EAT-10  Score 3/40   RSI Score 3/45     DI Score -/40 MLCQ Score -/40 SVHI Score -/40       Voice Usage:  []  Undemanding  []  Intermittent  []  Routine  [x]  Extensive  []  Extraordinary      OBJECTIVE:  Pt. was:    [x]   Alert  [x]   Ready to participate  []   Unable to participate  []   Confused []   Lethargic    CURRENT IMPAIRMENT/LEVEL OF FUNCTION:  Observations:   Resonance:    [] hypernasal  [] hyponasal   []  WNL  Focus:  []  forward focus []  posterior focus        Respiration: []  breath holding   [] stridor inspiratory/expiratory  [x] poor coordination of phonation/resp.      [] WNL     Articulation: []  limited orality []  imprecise  []  overarticulation      [x] WNL     Other:  []  pitch breaks []  hard glottal attacks  []  throat clear   []  cough     []   glottal fry    []  tremor    [x]  none     [x]  maximum phonation time 7 sec       Perceptual Rating Of Vocal Quality:  CAPE-V  The following parameters of voice quality will be rated upon completion of the following tasks:  1. Sustained vowels 2. Sentence production 3. Spontaneous speech    Clinician's Perceptual Assessment using Consensus Auditory Perceptual Evaluation of Voice (CAPE-V)    Overall Severity  20/100  [] Consistent [] Intermittent  Roughness   25/100  [] Consistent [] Intermittent  Breathiness   15/100  [] Consistent [] Intermittent  Strain    15/100  [] Consistent [] Intermittent  Pitch    -/100  [] Consistent [] Intermittent   Nature of abnormality:   Loudness   -/100  [] Consistent [] Intermittent   Nature of abnormality:       Swallow Evaluation:  Clinical Observations: -  Murray Secretion Rating Scale: [x]  Normal -0 []  Mild -1  [] Moderate -2   []  Severe -3   Oral Mechanism Examination: -    Pharyngeal Squeeze  [x] Present  []  Absent    Modified Functional Oral Intake Scale:     []  1 nothing by mouth       []  2 occasional attempts at food/liquid  []  3 consistent intake of food/liquid (towards meeting nutritional goals)  []  4 Total oral diet of 1 consistency      []  5 Total oral  diet, but requires modifications, compensations or adaptations  []  6 Total oral diet, but avoids a particular texture due to difficulty swallowing  [x]  7 No restrictions          3 oz water swallow: [] pass   [] fail    [x] not tested         Videostroboscopic Findings:    Patient was then seen with:  []  Dr. Freddi Starr,  []  Dr. Cristina Gong  []   Dr. Bjorn Loser  laryngologist, who performed flexible videostroboscopy which revealed the following:    VELOPHARYNGEAL FUNCTION: During production of pressure consonants, counting and alternating nasal/non-nasal CVCV productions:  [x] Complete [] Incomplete  [] Weak/slow coordination [] "bubbling" : location      MUCOSA: No obvious lesions, tacky mucous which displaced easily  VOCAL FOLD MOVEMENT:  Right:  [x]   normal   []  sluggish  []  decreased   []   immobile in ___ position  Left:  [x]   normal   []  sluggish  []  decreased   []   immobile in ___ position    GLOTTIC CLOSURE:   []   Complete intermittently      [x]   Incomplete:  [x]  anterior slit  [x]   posterior slit []  spindle []  hourglass   []  full length  []  other:   MUCOSAL WAVE:  Right:  [x]   present []   absent []   reduced    Left:   [x]   present    []   absent    []   reduced    Phase   []  symmetrical [x] asymmetrical     Periodicity:  [x]    periodic    []    aperiodic   []    intermittent Other:  Amplitude:  Right:  [x]   normal    []   decreased    []   increased  Left:  [x]   normal    []   decreased    []   increased    MUSCLE TENSION PATTERN:  [x]   Lateral: right/left (Type II) []   Ant/Post (Type III) []   Sphincteric (Type IV)  []   Splinting    []   BOT  recruitment   []  Other:      [x]  mild  []  mod  []  severe   Supraglottis: No epiglottic lesions noted today; bulging from L posterior pharyngeal wall unchanged since last exam  Subglottis:    []   Tremor:    []    Breaks:   Observations with Pitch Change: lengthens   Observations with Changes in Loudness: -    Observations with Treatment Probes: improved closure with better breath support     TREATMENT  AND EDUCATION:    Education:  Plan/recommendations were reviewed with patient/caregiver.  The following were: [x]   taught to patient/caregiver []   given in a written handout.   [x]  Voice  []  reflux  []  swallow   []  breathing/cough management strategies    [x]  Contact information was provided for further questions.    Response: [x]  Patient/caregiver verbalized understanding,   []  demonstrated strategies,      []  may need further instruction,    []  was unable to learn at this time.      Skilled Services Provided:   [x]   Beh. & qualitative analysis of voice & resonance   []   Speech language voice treatment  []   Swallowing evaluation   []   Swallow treatment  []   Acoustic analysis of Voice      ASSESSMENT:  Pt. 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 No lesions were noted on the epiglottis during exam today. Patient is satisfied with his voice at this time. Patient was encouraged to contact this clinic in the future if he would like refresher voice therapy. Will follow up in 6 months.       Rehabilitation potential is: Good  Potential Barriers to achieve rehab goals: none  These goals were discussed and the patient agrees with them.    DISCHARGE GOALS: Functional Discharge Goals are numbered and Short Term Goals are lettered (Time Frame for completion of goals will be determined at first treatment session):   [x]  Evaluation Only  1. Maintain best quality voice for functional communication and participation in community activities.   []  1a. Evaluate status of vocal folds.   []  1b. Patient will participate in education of basic anatomy and physiology of vocal fold vibration.   []  1c. Patient will understand and verbalize    []  1d. Patient will express comprehension of good vocal hygiene.   []  1e. Patient will express comprehension of reflux management.   []  24f. Patient will decrease laryngeal tension  through laryngeal manipulation/massage.   []  1g. During 2 minutes of spontaneous speech without cues, patient will demonstrate:   []  easy onset/flow phonation   []  forward focus   []  coordinated phonation and respiration   []  increased orality   []  Other:   []  1h. Other:    []  2.  Patient will tolerate least restrictive diet with no signs/symptoms of aspiration.   []  2a. Evaluate swallow status with Modified Barium Swallow Study (MBSS)   []  2b. Patient will utilize swallow management techniques with minimal cues.   []  2c. Pt. will carry out swallowing exercise program as developed by treating SLP.   []  Other:  []  3. Patient will use relaxed throat breathing and/or quick release breathing to interrupt incidents of chronic cough or stridor independently at desired level of activity.     Patient/Family Input to Goals: Pt. wishes to:  []  improve swallow  []  improve voice quality  []   decrease SOB/Stridor   [] decrease cough [x]  know status of vocal folds []  decrease pain []  other    PLAN OF CARE:   Frequency/Duration of Treatment:   [x] Evaluation only  [] MBSS  [] Pt. will be seen 1x/week for - weeks for   [] Voice tx    [] Swallowing evaluation with MBSS    [] Swallowing tx   [] Behavioral cough reduction    [] Breathing retraining   [] Other:    This patient will recheck with the referring provider per MD recs.  in 6 months.        The plan outlined above was formulated, reviewed, and agreed upon with the patient and/or family/caregiver.  Patient/caregiver verbalized understanding and restated information in plan.  Lyn Records, SLP    FUNCTIONAL REPORTING (Medicare only):  Reporting on Goal #1; Assessed at level 5 today using:    [x]  NOMS   []  Modified FOIS  []  Modified [place] Cough Questionnaire  []  Dyspnea Index   []  Other:    Current evaluation:     [] Swallow: H4742     [x] Voice G9171     [] other SLP goal G9174 CJ      Goal Status:           [] Swallow: V9563     [x] Voice G9172     [] other SLP goal G9175 CJ                  Discharge Status:  [] Swallow: O7564     [x] Voice G9173     [] other SLP goal G9176 CJ                Referring Physician/Practitioner Certification Statement:    I Certify the need for these services furnished under this plan of treatment and while under my care.

## 2013-11-23 ENCOUNTER — Ambulatory Visit: Payer: Medicare Other | Attending: Nephrology | Admitting: Nephrology

## 2013-11-23 VITALS — BP 121/77 | HR 62 | Temp 97.0°F | Ht 73.0 in | Wt 223.5 lb

## 2013-11-23 DIAGNOSIS — N183 Chronic kidney disease, stage 3 unspecified: Secondary | ICD-10-CM | POA: Insufficient documentation

## 2013-11-23 DIAGNOSIS — N189 Chronic kidney disease, unspecified: Secondary | ICD-10-CM | POA: Insufficient documentation

## 2013-11-23 DIAGNOSIS — I10 Essential (primary) hypertension: Secondary | ICD-10-CM | POA: Insufficient documentation

## 2013-11-23 LAB — URINALYSIS COMPLETE, URN
Bacteria, URN: NONE SEEN
Bilirubin (Qual), URN: NEGATIVE
Cast_Hyaline, URN: <1 /LPF — AB
Epith Cells_Renal/Trans,URN: NEGATIVE /HPF
Epith Cells_Squamous, URN: NEGATIVE /LPF
Glucose Qual, URN: NEGATIVE mg/dL
Ketones, URN: NEGATIVE mg/dL
Leukocyte Esterase, URN: NEGATIVE
Nitrite, URN: NEGATIVE
Occult Blood, URN: NEGATIVE
Protein (Alb Semiquant), URN: NEGATIVE mg/dL
RBC, URN: NEGATIVE /HPF
Specific Gravity, URN: 1.011 g/mL (ref 1.002–1.027)
WBC, URN: NEGATIVE /HPF

## 2013-11-23 NOTE — Patient Instructions (Signed)
1) chronic kidney disease most likely on the basis of underlying hypertension and known vascular disease (stroke). Over-all picture is stable. It will be important to monitor kidney function periodically (every 4 to 6 months) to monitor for progression.    2) blood pressure control is excellent! No changes to current anti-hypertensive regimen    3) continue to moderate salt intake as you are doing

## 2013-11-23 NOTE — Progress Notes (Signed)
CORDAI, LITTERER          [id number]          11/23/2013      RENAL CLINIC       IDENTIFICATION     Joel Parker is a 68 year old, African-American gentleman, with known chronic kidney disease, who is interested in transferring his care to the [facility]  He presents today to establish care and discuss additional workup and management for his underlying renal disorder.    HISTORY OF PRESENT ILLNESS     Joel Parker prior medical history is notable for long-standing hypertension and dyslipidemia.  In October 2013, he came to clinical attention when he had a stroke with resultant partial vocal cord paralysis.  Workup at that time revealed an elevated creatinine (ranging from 1.5 to 1.7), though in hindsight, this had likely been going on for some time.    The patient denies any known prior clinical history of kidney problems through childhood, adolescence, or early adulthood.  Specifically, he has not had any issues with kidney stones or infections of the genitourinary system.  Likewise, he has not had any problems with his prostate per se.  He denies any known history of blood or protein in his urine.  He reports that he is able to void well without difficulty, specifically denying hesitancy, weak stream, or post-void dribbling.  He denies having seen any blood or discoloration of the urine.  He denies any change in the amount of urine he has been producing.  He has occasionally had some lower extremity swelling, though he feels this has been well managed with moderating salt intake, as well as diuretic therapy.    PROBLEM LIST     1. 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  He is followed by Dr. Corliss Parish here at the [facility].  2. Gastroesophageal reflux disease.  3. Dyslipidemia.  4. Hypertension.  Diagnosed 10 years ago, though compliance with medications has been an issue in the past.    PAST MEDICAL HISTORY     1. Chronic  kidney disease, stage III.  2. Obstructive sleep apnea, initiated on CPAP.  3. Questionable history of coronary artery disease.  Pharmacological stress test in 2014 was negative for inducible ischemia.    Of note, the patient does not have any history of diabetes or underlying liver disease.    FAMILY HISTORY     No known family history of kidney disease.  He had a brother who had a heart attack.  There is also a family history of lung and brain cancer.    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  He is married with 2 children and 1 grandchild, all of whom live locally.  Significant smoking history, though he reports that he has essentially stopped since his diagnosis of a stroke.  He does drink alcohol socially (couple of beers weekly).    REVIEW OF SYSTEMS     The patient endorses occasional pain of his right knee ever since the stroke, associated with mild swelling, and tends to resolve with occasional Motrin.  He denies any other focally swollen, tender, or inflamed joints.  He denies any systemic rashes or swollen glands. He does endorse a recent syncopal episode for which he was taken to the emergency room at [facility].  Workup was reportedly unrevealing for any sort of acute  cardiac process.  He denies any history of exertional chest pain or palpitations.  No nausea or vomiting.  No unexplained fevers, chills, sweats, or flu-like symptoms.  His appetite remains robust.  Regarding exercise, he reports walking at work, but otherwise is not engaged in any formal exercise program per se.    The remainder of a complete review of systems is otherwise negative.    PHYSICAL EXAMINATION     GENERAL APPEARANCE:  Pleasant, well-appearing male, sitting comfortably, in no distress.  He is accompanied by his wife.  VITAL SIGNS:  Temperature 36.1 degrees centigrade, blood pressure 121/77, heart rate 62, oxygen saturations 100% on room air.  HEENT:  Eyes - No scleral  icterus is noted.  Pupils are round and reactive to light and accommodation.  Oropharynx notable for moist mucous membranes.  No oral lesions or ulcerations are noted.  CARDIAC:  Regular rate and rhythm.  Normal S1, S2.  No S3 or S4.  No murmurs or rubs.  No jugular venous distention.  No peripheral edema.  LUNGS:  Clear to auscultation bilaterally, without rales, rhonchi, or wheezes.  ABDOMEN:  Soft, nontender, nondistended.  No masses or organomegaly.  The patient is mildly obese.  EXTREMITIES:  No clubbing is noted.  No pitting edema is noted.  SKIN:  Distal extremities are well perfused.  No systemic rashes are identified.  LYMPHOID:  No generalized adenopathy.  MUSCULOSKELETAL:  No focally swollen, tender, or inflamed joints are noted.    LABORATORIES     Labs from December 2014 - Sodium is 142, potassium is 5.0, chloride is 104, bicarbonate is 24, BUN is 44, creatinine is 2.2 (increased from 2.1 in June 2014).  Spot urine protein-to-creatinine ratio is 0.1.  Calcium is 9.2, magnesium is 1.8, phosphorus is 3.7.  Intact PTH is 95.  Ferritin is 585.  Iron is 77, transferrin saturation is 27%.  Cholesterol total is 154 with an LDL of 82, HDL of 68, triglycerides are 56, hemoglobin is 10.4.      IMAGING     A renal ultrasound from February 2014 - Left kidney is 12 cm, right kidney is 11 cm.  Simple cyst is identified.  No evidence of hydronephrosis.    IMPRESSION AND PLAN     A 68 year old, African American male, with known chronic kidney disease in the setting of long-standing hypertension and vascular disease.  Issues as follows:    1. Chronic kidney disease.  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  I think this is most likely and certainly fits with his clustering of risk factors (race, long-standing hypertension, known vascular disease), his temporal course, and reportedly bland urine sediment.  I would like to repeat a urinalysis for completeness, but my suspicion that  he has an occult active glomerulonephritis is quite low.  Otherwise, I do not think further diagnostic workup is warranted at this point, and our focus should be on modifying potential risk factors for disease progression.  2. Hypertension.  It is important that the patient maintains optimal blood pressure control as he is currently doing (goal being less than 140/90 from a renal perspective).  He is on an excellent antihypertensive regimen, including angiotensin receptor blocker as first-line therapy.  I would not advocate any changes to his current regimen.  I encouraged him to continue to monitor his home blood pressure periodically and moderate salt intake.  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  Calcium and phosphate are both within  the normal range, not requiring binder therapy.  Intact PTH is borderline elevated, though in keeping with the current stage of chronic kidney disease.  The patient is actually receiving vitamin D supplementation.  4. Anemia of chronic kidney disease.  Hemoglobin remains greater than 10; therefore not at a range that erythrocyte stimulating agents would be indicated.  I do note that his iron stores are replete.    FOLLOWUP     No further issues addressed at this time.  I anticipate routine followup in Nephrology Clinic in 6 months or sooner should questions or problems arise.  The patient is agreeable with plan as outlined.     ADDENDUM:  U/A today is completely bland with no reported RBC, WBC, or cellular casts on microscopy. Accordingly no further renal workup is indicated at this. Followup plan as outlined above.

## 2014-02-17 ENCOUNTER — Ambulatory Visit: Payer: Medicare Other | Attending: Otolaryngology | Admitting: Otolaryngology

## 2014-02-17 ENCOUNTER — Ambulatory Visit: Payer: Medicare Other | Attending: Otolaryngology | Admitting: Speech-Language Pathologist

## 2014-02-17 VITALS — BP 114/71 | HR 63 | Ht 72.84 in | Wt 227.0 lb

## 2014-02-17 DIAGNOSIS — R490 Dysphonia: Secondary | ICD-10-CM

## 2014-02-17 DIAGNOSIS — J3801 Paralysis of vocal cords and larynx, unilateral: Secondary | ICD-10-CM | POA: Insufficient documentation

## 2014-02-17 DIAGNOSIS — IMO0001 Reserved for inherently not codable concepts without codable children: Secondary | ICD-10-CM | POA: Insufficient documentation

## 2014-02-17 DIAGNOSIS — J384 Edema of larynx: Secondary | ICD-10-CM | POA: Insufficient documentation

## 2014-02-17 NOTE — Progress Notes (Signed)
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 He states that his voice is significantly improved since his last visit and has no complaints. He denies difficulty talking, breathing and swallowing and states that he is doing well overall. He denies heartburn or regurgitation.    PHYSICAL EXAMINATION     VITAL SIGNS: BP 114/71 mmHg  Pulse 63  Ht 6' 0.83" (1.85 m)  Wt 227 lb (102.967 kg)  BMI 30.09 kg/m2  GENERAL: sitting comfortably in the examination chair, in no apparent pain or distress.   VOICE: hoarse but with no strain.  HEAD: normocephalic, no lesions.   FACE: symmetric.   EARS: normal external auditory canals and tympanic membranes.   NOSE: no significant masses, drainage, or major septal deviation.   MOUTH: oral cavity and oropharynx without masses or lesions.   NECK: soft and flat, with no palpable masses or lymphadenopathy. Larynx and trachea are midline and mobile and elevate appropriately with swallowing.   RESPIRATORY: no wheezing sounds or stridor. Moves air well throughout, breathes easily through nose and mouth.   NEUROLOGIC: A&Ox4. CN grossly intact.     PROCEDURE  Flexible fiberoptic laryngoscopy was performed with videostroboscopy. The nasal cavity was anesthetized with 4% lidocaine and decongested with Afrin. Laryngoscope was inserted without complication. The nasopharynx, oropharynx, and hypopharynx were inspected. Vocal fold mobility was assessed. Mucosal waves were generated with sustained phonatory tasking. The scope was removed from the nasal cavity without complications. The patient tolerated the procedure well.     FINDINGS  1. Mucosa: true vocal  cords were nonerythematous, nonedematous, and not thickened. Nasopharynx, oropharynx, base of tongue, and larynx are clear without lesions or masses. There is a slight shadow in the left ventricle.  2. Vocal fold closure: complete at modal pitch and slight gap at high pitch.  3. Vocal fold mobility: abduction/adduction are normal on the right, slight sluggishness is present on the left.  4. Mucosal wave: asymmetric, periodic     ASSESSMENT AND PLAN     Joel Parker is a 68 year old man who presents for vocal fold weakness s/p L vocal fold augmentation in July of 2014. He states that he is doing well overall today and has no complaints. He is very happy with his voice at present. There was no sign on exam today of papilloma and the site of his previous papilloma is clear today when compared to the prior study on January 01, 2013. While he still has some roughness to his voice, Joel Parker is happy currently. Options for further correction, including implant placement, were discussed and agreed that he was happy with his voice and to wait and see. Joel Parker was told to contact the clinic if any changes or appearance of symptoms occurs, otherwise he will follow-up in clinic in 6 months. We will continue to monitor for any signs of recurrence of the papilloma.          Thank you for allowing me to participate in this patient's care. If you have any questions regarding this case, or any others that you would like to  discuss, please do not hesitate to contact [facility] at [phone].          [place] ATTENDING NOTE  I, Ranae Pila, saw and evaluated this patient the day of their clinic visit. I have read and reviewed Dr. Johney Maine' note and agree.  I was present for the entire endoscopic procedure.    I have personally reviewed the history with the patient as well as going over the working diagnoses and management options.  I have answered the patient's questions to the best of my ability.

## 2014-02-17 NOTE — Progress Notes (Deleted)
[facility] FOLLOW-UP NOTE    Patient: Joel Parker    Date of Service: 02/17/2014    CC      HISTORY OF PRESENT ILLNESS     Joel Parker is a 68 year old man who presents today for       REVIEW OF SYSTEMS     A complete review of systems was performed and was negative except as mentioned in HPI. Patient reports no new symptoms from a GI, pulmonary, cardiac, or neurological standpoint.      PHYSICAL EXAMINATION     VITAL SIGNS: BP 114/71 mmHg  Pulse 63  Ht 6' 0.83" (1.85 m)  Wt 227 lb (102.967 kg)  BMI 30.09 kg/m2  GENERAL: sitting comfortably in the examination chair, in no apparent pain or distress.   VOICE: mild hoarseness. No strain  HEAD: normocephalic, no lesions.   FACE: symmetric.   EARS: normal external auditory canals and tympanic membranes.   NOSE: no significant masses, drainage, or major septal deviation.   MOUTH: oral cavity and oropharynx without masses or lesions.   NECK: soft and flat, with no palpable masses or lymphadenopathy. Larynx and trachea are midline and mobile and elevate appropriately with swallowing.   RESPIRATORY: no wheezing sounds or stridor. Moves air well throughout, breathes easily through nose and mouth.   NEUROLOGIC: A&Ox4. CN grossly intact.     PROCEDURE  Flexible fiberoptic laryngoscopy was performed with videostroboscopy. The nasal cavity was anesthetized with 4% lidocaine and decongested with Afrin. Laryngoscope was inserted without complication. The nasopharynx, oropharynx, and hypopharynx were inspected. Vocal fold mobility was assessed. Mucosal waves were generated with sustained phonatory tasking. The scope was removed from the nasal cavity without complications. The patient tolerated the procedure well.     FINDINGS  1. Mucosa: true vocal cords were nonerythematous, nonedematous, and not thickened. Nasopharynx, oropharynx, base of tongue, and larynx are clear without lesions or masses.  2. Vocal fold closure: complete.  3. Vocal fold mobility:  abduction/adduction are normal bilaterally.  4. Mucosal wave: symmetric, periodic, with normal amplitude.     ASSESSMENT AND PLAN     Joel Parker is a 68 year old *** who presents for ***      Thank you for allowing me to participate in this patient's care. If you have any questions regarding this case, or any others that you would like to discuss, please do not hesitate to contact [facility] at [phone].

## 2014-02-17 NOTE — Progress Notes (Signed)
SPEECH LANGUAGE PATHOLOGY/LARYNGOLOGY EVALUATION   AND INITIAL PLAN OF CARE     The following patient identifiers were confirmed today name and date of birth.    Time in: 10:08 AM Duration: 10:32 min   Onset Date: 04/03/2012  Start of Care Date: 07-11-12  Visits from Start of Care:  2  Referring Provider:  [x]  Dr. Freddi Starr,  []  Dr. Cristina Gong  []   Dr. 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    Past Surgical History   Procedure Laterality Date   . Tympanostomy general anesthesia       Mr. Piascik has a history of left vocal cord weakness which was treated in the operating room with vocal fold augmentation as well as having had a biopsy and laser ablation of his laryngeal surface of the epiglottis papilloma on January 08, 2013. 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     [x]  Laryngeal Surgery   []  MBSS [x]  Dysphagia []  Weight loss [] PNA      []  SOB []  Asthma []  Inhalers  []  Reflux symptoms  []  PPIs    Previous interventions: Prior speech therapy with Deland Pretty, SLP, in 2014 prior to surgery and vocal fold augmentation, July 2014.    Prior Level of Function: Before the onset of the current condition, the patient was able to speak normally.    SUBJECTIVE:     Patient Statement: Pt. reported improved voice since the injection. He reported stronger voice and no vocal fatigue. He uses his voice quite a bit for work and reported that his voice hold up well. He denied SOB and dysphagia. He consumes a regular diet with thin liquid.                      Barriers to learning: none                   Preferred Learning style: demonstration, written and verbal        Cultural Practices that Influence Care: none      Pain Rating/Action Taken/Fall report:  See Patient Self Assessment of  Pain and falls in medical record in OTO section from today.    VHI-10 Score 7/40 EAT-10 Score 2/40   RSI Score 2/45       Voice Usage:  []  Undemanding  []  Intermittent  [x]  Routine  []  Extensive  []  Extraordinary      OBJECTIVE:  Pt. was:    [x]   Alert  [x]   Ready to participate  []   Unable to participate  []   Confused []   Lethargic    CURRENT IMPAIRMENT/LEVEL OF FUNCTION:  Observations:   Resonance:    [] hypernasal  [] hyponasal   []  WNL  Focus:  []  forward focus [x]  posterior focus        Respiration: []  breath holding   [] stridor inspiratory/expiratory  [] poor coordination of phonation/resp.      [] WNL     Articulation: []  limited orality []  imprecise  []  overarticulation      [x] WNL     Other:  []  pitch breaks []  hard glottal attacks  []  throat clear   []  cough     []   glottal fry    []  tremor    [x]  none     []  maximum phonation time       Perceptual Rating Of Vocal Quality:  CAPE-V  The following parameters of voice quality will be rated upon completion of the following tasks:  1. Sustained vowels 2. Sentence production 3. Spontaneous speech    Clinician's Perceptual Assessment using Consensus Auditory Perceptual Evaluation of Voice (CAPE-V)    Overall Severity  24/100  [] Consistent [x] Intermittent  Roughness   27/100  [] Consistent [x] Intermittent  Breathiness   12/100  [] Consistent [x] Intermittent  Strain    14/100  [] Consistent [x] Intermittent  Pitch    11/100  [] Consistent [x] Intermittent   Nature of abnormality:   Loudness   15/100  [] Consistent [x] Intermittent   Nature of abnormality:          Videostroboscopic Findings:    Patient was then seen with:  [x]  Dr. Freddi Starr,  []  Dr. Cristina Gong  []   Dr. Bjorn Loser  laryngologist, who performed flexible videostroboscopy which revealed the following:    VELOPHARYNGEAL FUNCTION: During production of pressure consonants, counting and alternating nasal/non-nasal CVCV productions:  [x] Complete [] Incomplete  [] Weak/slow coordination [] "bubbling" : location      MUCOSA: Bilateral  edema  VOCAL FOLD MOVEMENT:  Right:  [x]   normal   []  sluggish  []  decreased   []   immobile in ___ position  Left:  []   normal   [x]  sluggish  []  decreased   []   immobile in ___ position    GLOTTIC CLOSURE:   []   Complete intermittently      [x]   Incomplete:  [x]  anterior slit slight at higher pitch []   posterior slit []  spindle []  hourglass   []  full length  []  other:   MUCOSAL WAVE:  Right:  [x]   present []   absent []   reduced    Left:   [x]   present    []   absent    []   reduced    Phase   []  symmetrical [x] asymmetrical     Periodicity:  [x]    periodic    []    aperiodic   []    intermittent Other:  Amplitude:  Right:  []   normal    []   decreased    [x]   increased  Left:  []   normal    []   decreased    [x]   increased    MUSCLE TENSION PATTERN:  [x]   Lateral: right/left (Type II) []   Ant/Post (Type III) [x]   Sphincteric (Type IV)  []   Splinting    []   BOT recruitment   []  Other:      [x]  mild  [x]  mod  []  severe   Supraglottis: Left posterior ventricle "shadow"     Observations with Pitch Change: lengthens   Observations with Changes in Loudness: -    Observations with Treatment Probes: -    TREATMENT AND EDUCATION:    Education:  Plan/recommendations were reviewed with patient/caregiver.  The following were: [x]   taught to patient/caregiver []   given in a written handout.   [x]  Voice  []  reflux  []  swallow   []  breathing/cough management strategies    [x]  Contact information was provided for further questions.    Response: [x]  Patient/caregiver verbalized understanding,   []  demonstrated strategies,      []  may need further instruction,    []  was unable to learn at this time.      Skilled Services Provided:   [x]   Beh. & qualitative  analysis of voice & resonance   []   Speech language voice treatment  []   Swallowing evaluation   []   Swallow treatment  []   Acoustic analysis of Voice      ASSESSMENT:  Pt. 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 No lesions were noted on the epiglottis during exam today. Patient is satisfied with his voice at this time. Patient was encouraged to contact this clinic in the future if he would like refresher voice therapy.        Rehabilitation potential is: Good  Potential Barriers to achieve rehab goals: none  These goals were discussed and the patient agrees with them.    DISCHARGE GOALS: Functional Discharge Goals are numbered and Short Term Goals are lettered (Time Frame for completion of goals will be determined at first treatment session):   [x]  Evaluation Only  1. Maintain best quality voice for functional communication and participation in community activities.   [x]  1a. Evaluate status of vocal folds.   []  1b. Patient will participate in education of basic anatomy and physiology of vocal fold vibration.   []  1c. Patient will understand and verbalize    []  1d. Patient will express comprehension of good vocal hygiene.   []  1e. Patient will express comprehension of reflux management.   []  97f. Patient will decrease laryngeal tension through laryngeal manipulation/massage.   []  1g. During 2 minutes of spontaneous speech without cues, patient will demonstrate:   []  easy onset/flow phonation   []  forward focus   []  coordinated phonation and respiration   []  increased orality    Patient/Family Input to Goals: Pt. wishes to:  []  improve swallow  []  improve voice quality  [] decrease SOB/Stridor   [] decrease cough [x]  know status of vocal folds []  decrease pain []  other    PLAN OF CARE:   Frequency/Duration of Treatment:   [x] Evaluation only  [] MBSS  [] Pt. will be seen 1x/week for - weeks for   [] Voice tx    [] Swallowing evaluation with MBSS    [] Swallowing tx   [] Behavioral cough reduction    [] Breathing retraining   [] Other:    This patient will recheck with the referring provider per MD recs.          The plan outlined above was formulated, reviewed, and agreed upon with the patient and/or  family/caregiver.  Patient/caregiver verbalized understanding and restated information in plan.  Steffon Gladu E Alicia Ackert, SLP      FUNCTIONAL REPORTING (Medicare only):  Reporting on Goal #1; Assessed at level 5 today using:    [x]  NOMS   []  Modified FOIS  []  Modified [place] Cough Questionnaire  []  Dyspnea Index   []  Other:    Current evaluation:     [] Swallow: G8996     [x] Voice G9171     [] other SLP goal G9174 CJ      Goal Status:           [] Swallow: F0932     [x] Voice G9172     [] other SLP goal G9175 CJ                 Discharge Status:  [] Swallow: T5573     [x] Voice G9173     [] other SLP goal G9176 CJ                Referring Physician/Practitioner Certification Statement:    I Certify the need for these services furnished  under this plan of treatment and while under my care.

## 2014-05-24 ENCOUNTER — Encounter (HOSPITAL_BASED_OUTPATIENT_CLINIC_OR_DEPARTMENT_OTHER): Payer: Medicare Other | Admitting: Nephrology

## 2014-08-22 ENCOUNTER — Ambulatory Visit: Payer: Medicare Other | Attending: Otolaryngology | Admitting: Speech-Language Pathologist

## 2014-08-22 ENCOUNTER — Ambulatory Visit: Payer: Medicare Other | Attending: Otolaryngology | Admitting: Otolaryngology

## 2014-08-22 VITALS — BP 128/88 | HR 62

## 2014-08-22 DIAGNOSIS — J3800 Paralysis of vocal cords and larynx, unspecified: Secondary | ICD-10-CM

## 2014-08-22 DIAGNOSIS — J3801 Paralysis of vocal cords and larynx, unilateral: Secondary | ICD-10-CM

## 2014-08-22 DIAGNOSIS — R490 Dysphonia: Secondary | ICD-10-CM | POA: Insufficient documentation

## 2014-08-22 DIAGNOSIS — J384 Edema of larynx: Secondary | ICD-10-CM | POA: Insufficient documentation

## 2014-08-22 DIAGNOSIS — J387 Other diseases of larynx: Secondary | ICD-10-CM | POA: Insufficient documentation

## 2014-08-22 NOTE — Progress Notes (Signed)
SPEECH LANGUAGE PATHOLOGY/LARYNGOLOGY EVALUATION   AND INITIAL PLAN OF CARE     The following patient identifiers were confirmed today name and date of birth.    Time in: 9:45 Duration: 45   Onset Date: 04/03/2012  Start of Care Date: 07-11-12  Visits from Start of Care:  1  Referring Provider:  [x]  Dr. Freddi Starr,  []  Dr. Cristina Gong  []   Dr. 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    Past Surgical History   Procedure Laterality Date   . 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     [x]  Laryngeal Surgery   []  MBSS [x]  Dysphagia []  Weight loss [] PNA      []  SOB []  Asthma []  Inhalers  []  Reflux symptoms  []  PPIs    Previous interventions: Prior speech therapy with Joel Parker, SLP, in 2014 prior to surgery and vocal fold augmentation, July 2014.    Prior Level of Function: Before the onset of the current condition, the patient was able to speak normally.    SUBJECTIVE:     Patient Statement: Patient reported that his voice has been stable. He believes that the vocal fold injection has worn off by now but he is satisfied with his voice at this time. He does notice vocal fatigue in the evenings after speaking all day. No SOB. Liquids intermittently go the wrong way when he swallows which causes him to cough. He reported this occurs infrequently (less than once a week). No reported symptoms of reflux and does not take reflux medications. He denied recent pneumonia.                       Barriers to  learning: none                   Preferred Learning style: demonstration, written and verbal        Cultural Practices that Influence Care: none      Pain Rating/Action Taken/Fall report:  See Patient Self Assessment of Pain and falls in medical record in OTO section from today.    VHI-10 Score 8/40 EAT-10 Score 5/40   RSI Score 5/45        Voice Usage:  []  Undemanding  []  Intermittent  [x]  Routine  []  Extensive  []  Extraordinary      OBJECTIVE:  Pt. was:    [x]   Alert  [x]   Ready to participate  []   Unable to participate  []   Confused []   Lethargic    CURRENT IMPAIRMENT/LEVEL OF FUNCTION:  Observations:   Resonance:    [] hypernasal  [] hyponasal   []  WNL  Focus:  []  forward focus [x]  posterior focus        Respiration: []  breath holding   [] stridor inspiratory/expiratory  [] poor coordination of phonation/resp.      [] WNL  Articulation: []  limited orality []  imprecise  []  overarticulation      [x] WNL     Other:  []  pitch breaks []  hard glottal attacks  []  throat clear   []  cough     []  glottal fry    []  tremor    [x]  none     [x]  maximum phonation time - 6 sec       Perceptual Rating Of Vocal Quality:  CAPE-V  The following parameters of voice quality will be rated upon completion of the following tasks:  1. Sustained vowels 2. Sentence production 3. Spontaneous speech    Clinician's Perceptual Assessment using Consensus Auditory Perceptual Evaluation of Voice (CAPE-V)    Overall Severity  34/100  [] Consistent [x] Intermittent  Roughness   36/100  [] Consistent [x] Intermittent  Breathiness   26/100  [] Consistent [x] Intermittent  Strain    32/100  [] Consistent [x] Intermittent  Pitch    16/100  [] Consistent [x] Intermittent   Nature of abnormality: low  Loudness   5/100  [] Consistent [x] Intermittent   Nature of abnormality: low          Videostroboscopic Findings:    Patient was then seen with:  [x]  Dr. Freddi Starr,  []  Dr. Cristina Gong  []   Dr. Bjorn Loser  laryngologist, who performed flexible videostroboscopy which revealed the  following:    VELOPHARYNGEAL FUNCTION: During production of pressure consonants, counting and alternating nasal/non-nasal CVCV productions:  [x] Complete [] Incomplete  [] Weak/slow coordination [] "bubbling" : location      MUCOSA: Bilateral edema and irregular edges  VOCAL FOLD MOVEMENT:  Right:  [x]   normal   []  sluggish  []  decreased   []   immobile in ___ position  Left:  []   normal   [x]  ?sluggish  []  decreased   []   immobile in ___ position    GLOTTIC CLOSURE:   []   Complete intermittently      [x]   Incomplete:  [x]  anterior gap  []   posterior slit []  spindle []  hourglass   []  full length  []  other:   MUCOSAL WAVE:  Right:  [x]   present []   absent []   reduced    Left:   [x]   present    []   absent    []   reduced    Phase   []  symmetrical [x] asymmetrical     Periodicity:  [x]    periodic    []    aperiodic   []    intermittent Other:  Amplitude:  Right:  []   normal    []   decreased    [x]   increased  Left:  []   normal    []   decreased    [x]   increased    MUSCLE TENSION PATTERN:  []   Lateral: right/left (Type II) []   Ant/Post (Type III) [x]   Sphincteric (Type IV)  []   Splinting    []   BOT recruitment   []  Other:      [x]  mild  []  mod  []  severe   Supraglottis: B posterior ventricle "shadow" L>R, bulky tissue at posterior pharyngeal wall unchanged since last exam (likely due to osteophyte)   Observations with Pitch Change: lengthens   Observations with Changes in Loudness: -    Observations with Treatment Probes: -    TREATMENT AND EDUCATION:    Education:  Plan/recommendations were reviewed with patient/caregiver.  The following were: [x]   taught to patient/caregiver []   given in a written handout.   [x]  Voice  []  reflux  []  swallow   []  breathing/cough management strategies    [  x] Contact information was provided for further questions.    Response: [x]  Patient/caregiver verbalized understanding,   []  demonstrated strategies,      []  may need further instruction,    []  was unable to learn at this time.      Skilled  Services Provided:   [x]   Beh. & qualitative analysis of voice & resonance   []   Speech language voice treatment  []   Swallowing evaluation   []   Swallow treatment  []   Acoustic analysis of Voice      ASSESSMENT:  Pt. 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 Patient is satisfied with his voice at this time. Patient was encouraged to contact this clinic in the future if he would like refresher voice therapy.        Rehabilitation potential is: Good  Potential Barriers to achieve rehab goals: none  These goals were discussed and the patient agrees with them.    DISCHARGE GOALS: Functional Discharge Goals are numbered and Short Term Goals are lettered (Time Frame for completion of goals will be determined at first treatment session):   [x]  Evaluation Only  1. Maintain best quality voice for functional communication and participation in community activities.   [x]  1a. Evaluate status of vocal folds.   []  1b. Patient will participate in education of basic anatomy and physiology of vocal fold vibration.   []  1c. Patient will understand and verbalize    []  1d. Patient will express comprehension of good vocal hygiene.   []  1e. Patient will express comprehension of reflux management.   []  71f. Patient will decrease laryngeal tension through laryngeal manipulation/massage.   []  1g. During 2 minutes of spontaneous speech without cues, patient will demonstrate:   []  easy onset/flow phonation   []  forward focus   []  coordinated phonation and respiration   []  increased orality    Patient/Family Input to Goals: Pt. wishes to:  []  improve swallow  []  improve voice quality  [] decrease SOB/Stridor   [] decrease cough [x]  know status of vocal folds []  decrease pain []  other    PLAN OF CARE:   Frequency/Duration of Treatment:   [x] Evaluation only  [] MBSS  [] Pt. will be seen 1x/week for - weeks for   [] Voice tx    [] Swallowing  evaluation with MBSS    [] Swallowing tx   [] Behavioral cough reduction    [] Breathing retraining   [] Other:    This patient will recheck with the referring provider per MD recs.          The plan outlined above was formulated, reviewed, and agreed upon with the patient and/or family/caregiver.  Patient/caregiver verbalized understanding and restated information in plan.    Shaquala Broeker A. D'Oyley, MS, [facility]  Speech Pathology  [phone]      FUNCTIONAL REPORTING (Medicare only):  Reporting on Goal #1; Assessed at level 5 today using:    [x]  NOMS   []  Modified FOIS  []  Modified [place] Cough Questionnaire  []  Dyspnea Index   []  Other:    Current evaluation:     [] Swallow: J1884     [x] Voice G9171     [] other SLP goal G9174 CJ      Goal Status:           [] Swallow: Z6606     [x] Voice G9172     [] other SLP goal G9175 CJ  Discharge Status:  [] Swallow: K3276     [x] Voice G9173     [] other SLP goal G9176 CJ                Referring Physician/Practitioner Certification Statement:    I Certify the need for these services furnished under this plan of treatment and while under my care.

## 2014-08-22 NOTE — Progress Notes (Signed)
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 He was last seen in August, 2015, at which point, he was content with his voice and did not show any evidence of papilloma recurrence. Today, he states that his voice is unchanged from prior, but his wife endorses that his voice has gotten "deeper in the last couple of months." This is not constant, and depends if he is talking faster. He experiences voice fatigue at the end of the day. He denies "heartburn", and denies coughing, or clearing secretions.  He states he has intermittent globus sensation. He denies dyspnea, or any stridor. He is tolerating a regular diet. He denies any trouble with solid foods. He is able to tolerate liquids, but intermittently has liquids "go down the wrong way" intermittently. He was recently worked up for syncope in October 2015 for dehydration, which required a 2 day hospitalization. He has not had any further episodes since.  Denies dysphagia, odynphagia, weight loss, or otalgia.       Medications:  Outpatient Prescriptions Prior to Visit   Medication Sig Dispense Refill   . AmLODIPine Besylate 10 MG Oral Tab 10 mg PO Daily     . Aspirin 81 MG Oral Tab clarify dose/directions PO     . Atorvastatin Calcium (LIPITOR) 10 MG Oral Tab 10 mg PO QHS     . Chlorthalidone 25 MG Oral Tab 1 TABLET EVERY MORNING     . Ergocalciferol (VITAMIN D2 OR) Take 50,000 Units by mouth every 7 days.     . Gabapentin, PHN, 300 MG Oral Tab take 2 caps at bedtime     . Ibuprofen 200 MG Oral Cap Take 200 mg by mouth daily as needed.     . Triamcinolone Acetonide 0.1 % External Cream apply to affected area for dermatitis     . Valsartan (DIOVAN) 80 MG Oral Tab take 1  tab po qd       No facility-administered medications prior to visit.         Physical examination:  General: No apparent distress  Voice: hoarse, raspy, strained  HEENT:    Neck: flat, no LAD  OP clear without masses or lesions  Neuro: CN2-12 grossly in tact       PROCEDURE :    Flex Videostroboscopy   INDICATION:   Hoarsenss /dysphonia   OPERATORS:      Drs. Stevenson Clinch  SLP: D'Oyley   FINDINGS:     Nasopharynx clear   Palate elevation and closure:  Complete  Base of tongue, vallecula, epiglottis, and piriform sinuses clear.  Subglottis: clear  Compression/ hyperfunction: None   Motion:Right vocal cord mobile and left vocal cord mobile  On Stroboscopy:   Mucosa: anterior left ventricle shadow that is stable from prior examation; posterior pharyngeal "bulge" that is stable from prior examination    Mucosal Wave: normal   Glottic Closure: posterior glottic gap   DESCRIPTION OF PROCEDURE:   The procedure was explained to the patient who verbally agreed to proceed.  Afrin and 4% lidocaine was used as topical mucosal anesthetic. The flexible endoscope was passed through the R nostril with the findings as described above. The scope was gently withdrawn. The patient  tolerated the procedure well without complications.  The video was reviewed with the patient and their questions were answered.     Questionnaires:  1. VHI-10: 8/40  2. EAT-10: 5/40  3. RSI 5/45       Assessment and plan:  In summary, 68M with prior history of stroke with left vocal paresis s/p injection in July 2014 along with biopsy of epiglottic lesion that returned as papilloma. From a voice perspective, he is still hoarse but is content with his voice. We discussed possible options of in office vocal cord injections, but Mr. Pope is not interested in pursing this option right now. Further, exam today did not show any evidence of papilloma recurrence. There was a bulge located in the posterior pharynx that upon review from prior radiological exam  represents an osteophyte protrusion. He will return to clinic in 6 months for follow-up visit.            [place] ATTENDING NOTE  I, Ranae Pila, saw and evaluated this patient the day of their clinic visit. I have read and reviewed Dr. Juanetta Gosling note and agree.  I was present for the entire endoscopic procedure.    I have personally reviewed the history with the patient as well as going over the working diagnoses and management options.  I have answered the patient's questions to the best of my ability.

## 2015-02-20 ENCOUNTER — Ambulatory Visit: Payer: Medicare Other | Attending: Otolaryngology | Admitting: Speech-Language Pathologist

## 2015-02-20 ENCOUNTER — Ambulatory Visit: Payer: Medicare Other | Attending: Otolaryngology | Admitting: Otolaryngology

## 2015-02-20 VITALS — BP 132/84 | HR 65 | Temp 97.9°F | Ht 72.84 in

## 2015-02-20 DIAGNOSIS — R490 Dysphonia: Secondary | ICD-10-CM | POA: Insufficient documentation

## 2015-02-20 NOTE — Progress Notes (Signed)
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 He was last seen in February 2016 and at that time had no specific complaints. Today, he states that his voice is unchanged from prior and remains somewhat deeper than his baseline voice, at least per his wife. He does experience vocal fatigue at the end of the day. These symptoms are not particularly bothersome to him. Endorses infrequent dysphagia to solids when not paying attention to eating. This results in some mild coughing and is unchanged from his last visit. He maintains a regular diet. He denies dyspnea at rest, with exercise or during sleep. He denies traditional GERD symptoms, pharyngeal regurgitation, sour taste in back of throat, or nocturnal reflux episodes and is not on any acid-suppressing medications. He remains a non-smoker after having quit approximately 1 year ago.       P/E:     Vitals:  BP 132/84 mmHg  Pulse 65  Temp(Src) 97.9 F (36.6 C) (Temporal)  Ht 6' 0.83" (1.85 m)  SpO2 96%    General: Well nourished male, in no acute distress.   Voice: normal volume, mild hoarseness    Head:  NCAT, EOMI, facial motion and sensation symmetric,   Ears:  external auditory canals clear TM with normal bony landmarks.    Nose:  Nares patent, external nose without deformity,   Oral Cavity:  dentition healthy, gingiva healthy, tongue mobile and midline, palate raises symmetrically,  Neuro:  Facial nerve HB Gr I bilaterally. CN 2-12 intact.    Psych: Normal Affect.   Neck:  Neck without LAD, thyroid non-palpable, laryngotracheal structures  midline.  Chest:  Normal work of breathing  CV: Carotid pulses normal, no palpable thrills  Extremities:  extremities warm and without edema or tremor.   Skin: Visual examination of scalp, face and neck, reveals no rashes, ulcerations, nodules or lesions.        PROCEDURE :    Flex Videostroboscopy   INDICATION:   Hoarsenss /dysphonia   OPERATORS:      Dr. Guerry Minors and  Dr. Freddi Starr  SLP: Joel Parker   FINDINGS:     Nasopharynx: prominent lymphoid tissue scattered throughout oropharynx  Palate elevation and closure:  Complete  Base of tongue, vallecula, epiglottis, and piriform sinuses clear. Small area of scarring at site of previous epigottic biopsy  Subglottis: clear  Compression/ hyperfunction: None   Motion: True vocal folds were bilaterally mobile  On Stroboscopy:   Mucosa: cords are thin bilaterally; anterior left ventricle shadow that is stable from last exam; left anterior cord scarring   Mucosal Wave: Normal amplitude, and in phase    Glottic Closure: bowing of cords   DESCRIPTION OF PROCEDURE:   The procedure was explained to the patient who verbally agreed to proceed.  Afrin and 4% lidocaine was used as topical mucosal anesthetic. The flexible endoscope was passed through the RIGHT nostril with the findings as described above. The scope was gently withdrawn. The patient tolerated the procedure well without complications.  The video was  reviewed with the patient and their questions were answered.       Assessment/Plan:  This is a 69 year old with prior history of stroke complicated by left vocal cord paresis s/p injection July 2014 as well as biopsy of epiglottic lesion which returned as a benign papilloma. His exam is stable from prior and without evidence of recurrent papilloma. We discussed options to further bolster the left true vocal cord and improve the mild hoarseness in his voice but he is not interested in this at this time.     Our plan is to see him back in 6 months for routine surveillance.      In the meanwhile, thank you again for allowing me to participate in this patient's care. If you have any questions regarding this case, or any others that you would like to discuss, please do not hesitate to contact me here at [facility], [phone]        Joel Parker, Joel Parker, saw and evaluated this patient the day of their clinic visit. I have read and reviewed Dr. Lucienne Capers note and agree.  I was present for the entire endoscopic procedure.    I have personally reviewed the history with the patient as well as going over the working diagnoses and management options.  I have answered the patient's questions to the best of my ability.

## 2015-02-20 NOTE — Progress Notes (Signed)
SPEECH LANGUAGE PATHOLOGY/LARYNGOLOGY EVALUATION     The following patient identifiers were confirmed today name and date of birth.    Time in: 9:10 Duration: 60   Onset Date: 04/03/2012  Start of Care Date: 07-11-12  Visits from Start of Care:  1   Referring Provider:  [x]  Dr. Freddi Starr,  []  Dr. Cristina Gong  []   Dr. Bjorn Loser  Treatment/Therapy Diagnosis:   Problem List Items Addressed This Visit     Dysphonia - Primary        CLINICAL IMPRESSION: Pt. 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 Patient continues to be satisfied with his voice at this time. Patient was encouraged to contact this clinic in the future should he wish to pursue refresher voice therapy. He will return for f/u with MD in 6 months.     PERTINENT MEDICAL/SURGICAL HISTORY:      Past Surgical History   Procedure Laterality Date   . Tympanostomy general anesthesia       Mr. Holeman has a history of left vocal cord weakness which was treated in the operating room with vocal fold augmentation in the operating room on January 08, 2013. 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     [x]  Laryngeal Surgery    []  MBSS [x]  Dysphagia []  Weight loss [] PNA      []  SOB []  Asthma []  Inhalers  []  Reflux symptoms  []  PPIs    Previous interventions: Prior speech therapy with Deland Pretty, SLP, in 2014 prior to surgery and vocal fold augmentation, July 2014.    Prior Level of Function: Before the onset of the current condition, the patient was able to speak normally.    SUBJECTIVE:     Patient Statement: Patient reported that his voice remains stable since the time of his last exam in February, 2016. He continues to experience vocal fatigue in the evenings after speaking all day but this has not worsened. He is currently not interested in any intervention for his voice;  rather he is here for routine f/u. No SOB. Solid foods intermittently "get stuck" in his throat if he is not paying attention to eating. This will sometimes cause him to cough. Patient takes a liquid wash to manage. Patient denied heartburn, indigestion and sour taste in his mouth. No reflux medication.                      Barriers to learning: none                   Preferred Learning style: demonstration, written and verbal        Cultural Practices that Influence Care: none      Pain Rating/Action Taken/Fall report:  See Patient Self Assessment of Pain and falls in medical record in OTO section from today.    VHI-10 Score 4/40 EAT-10 Score 1/40   RSI Score 4/45        Voice Usage:  []  Undemanding  []  Intermittent  [x]  Routine  []  Extensive  []  Extraordinary      OBJECTIVE:  Pt. was:    [x]   Alert  [x]   Ready to participate  []   Unable to participate  []   Confused []   Lethargic    CURRENT IMPAIRMENT/LEVEL OF FUNCTION:  Observations:   Resonance:    []   hypernasal  [] hyponasal   []  WNL  Focus:  []  forward focus [x]  posterior focus        Respiration: []  breath holding   [] stridor inspiratory/expiratory  [] poor coordination of phonation/resp.      [] WNL     Articulation: []  limited orality []  imprecise  []  overarticulation      [x] WNL     Other:  []  pitch breaks []  hard glottal attacks  []  throat clear   []  cough     []  glottal fry    []  tremor    [x]  none     [x]  maximum phonation time - 6 sec       Perceptual Rating Of Vocal Quality:  CAPE-V  The following parameters of voice quality will be rated upon completion of the following tasks:  1. Sustained vowels 2. Sentence production 3. Spontaneous speech    Clinician's Perceptual Assessment using Consensus Auditory Perceptual Evaluation of Voice (CAPE-V)    Overall Severity  29/100  [] Consistent [x] Intermittent  Roughness   20/100  [] Consistent [x] Intermittent  Breathiness   18/100  [] Consistent [x] Intermittent  Strain    23/100  [] Consistent [x] Intermittent  Pitch     15/100  [] Consistent [x] Intermittent   Nature of abnormality: low  Loudness   15/100  [] Consistent [x] Intermittent   Nature of abnormality: low          Videostroboscopic Findings:    Patient was then seen with:  [x]  Dr. Freddi Starr,  []  Dr. Cristina Gong  []   Dr. Bjorn Loser  laryngologist, who performed flexible videostroboscopy which revealed the following:    VELOPHARYNGEAL FUNCTION: During production of pressure consonants, counting and alternating nasal/non-nasal CVCV productions:  [x] Complete [] Incomplete  [] Weak/slow coordination [] "bubbling" : location      MUCOSA: Bilateral edema / irregular TVF edge; mid-to-anterior L TVF scar    VOCAL FOLD MOVEMENT:  Right:  [x]   normal   []  sluggish  []  decreased   []   immobile in ___ position  Left:  []   normal   [x]  ?sluggish  []  decreased   []   immobile in ___ position    GLOTTIC CLOSURE:   []   Complete intermittently      [x]   Incomplete:  [x]  anterior gap  []   posterior slit []  spindle []  hourglass   []  full length  []  other:     MUCOSAL WAVE:  Right:  [x]   present []   absent []   reduced    Left:   [x]   present    []   absent    []   reduced    Phase   []  symmetrical [x] asymmetrical     Periodicity:  [x]    periodic    []    aperiodic   []    intermittent Other:  Amplitude:  Right:  []   normal    []   decreased    [x]   increased  Left:  []   normal    []   decreased    [x]   increased    MUSCLE TENSION PATTERN:  [x]   Lateral: right/left (Type II) []   Ant/Post (Type III) []   Sphincteric (Type IV)  []   Splinting    []   BOT recruitment   []  Other:      [x]  mild  []  mod  []  severe   Supraglottis: B posterior ventricle "shadow" L>R, bulky tissue at posterior pharyngeal wall unchanged since last exam (likely due to osteophyte)   Observations with Pitch Change: lengthens   Observations with Changes in Loudness: -  Observations with Treatment Probes: -    TREATMENT AND EDUCATION:    Education:  Plan/recommendations were reviewed with patient/caregiver.  The following were: [x]   taught to  patient/caregiver []   given in a written handout.   [x]  Voice  []  reflux  []  swallow   []  breathing/cough management strategies    [x]  Contact information was provided for further questions.    Response: [x]  Patient/caregiver verbalized understanding,   []  demonstrated strategies,      []  may need further instruction,    []  was unable to learn at this time.      Skilled Services Provided:   [x]   Beh. & qualitative analysis of voice & resonance   []   Speech language voice treatment  []   Swallowing evaluation   []   Swallow treatment  []   Acoustic analysis of Voice      ASSESSMENT:  See clinical impression above.       Rehabilitation potential is: Good  Potential Barriers to achieve rehab goals: none  These goals were discussed and the patient agrees with them.    DISCHARGE GOALS: Functional Discharge Goals are numbered and Short Term Goals are lettered (Time Frame for completion of goals will be determined at first treatment session):   [x]  Evaluation Only  1. Maintain best quality voice for functional communication and participation in community activities.   [x]  1a. Evaluate status of vocal folds.   []  1b. Patient will participate in education of basic anatomy and physiology of vocal fold vibration.   []  1c. Patient will understand and verbalize    []  1d. Patient will express comprehension of good vocal hygiene.   []  1e. Patient will express comprehension of reflux management.   []  59f. Patient will decrease laryngeal tension through laryngeal manipulation/massage.   []  1g. During 2 minutes of spontaneous speech without cues, patient will demonstrate:   []  easy onset/flow phonation   []  forward focus   []  coordinated phonation and respiration   []  increased orality    Patient/Family Input to Goals: Pt. wishes to:  []  improve swallow  []  improve voice quality  [] decrease SOB/Stridor   [] decrease cough [x]  know status of vocal folds []  decrease pain []  other    PLAN OF CARE:   Frequency/Duration of Treatment:    [x] Evaluation only  [] MBSS  [] Pt. will be seen 1x/week for - weeks for   [] Voice tx    [] Swallowing evaluation with MBSS    [] Swallowing tx   [] Behavioral cough reduction    [] Breathing retraining   [] Other:    This patient will recheck with the referring provider per MD recs in 6 months.       The plan outlined above was formulated, reviewed, and agreed upon with the patient and/or family/caregiver.  Patient/caregiver verbalized understanding and restated information in plan.    Hilario Quarry, MS, [place] [facility]  Voicemail: [phone]  Pager: [phone]      FUNCTIONAL REPORTING (Medicare only):  Reporting on Goal #1; Assessed at level 5 today using:    [x]  NOMS   []  Modified FOIS  []  Modified [place] Cough Questionnaire  []  Dyspnea Index   []  Other:    Current evaluation:     [] Swallow: G8996     [x] Voice G9171     [] other SLP goal G9174 CJ      Goal Status:           [] Swallow: [place].Bores     [  x]Voice G9172     [] other SLP goal G9175 CJ                 Discharge Status:  [] Swallow: K4401     [x] Voice G9173     [] other SLP goal G9176 CJ                Referring Physician/Practitioner Certification Statement:    I Certify the need for these services furnished under this plan of treatment and while under my care.

## 2015-08-24 ENCOUNTER — Ambulatory Visit: Payer: Medicare Other | Attending: Otolaryngology | Admitting: Speech-Language Pathologist

## 2015-08-24 ENCOUNTER — Ambulatory Visit: Payer: Medicare Other | Attending: Otolaryngology | Admitting: Otolaryngology

## 2015-08-24 VITALS — BP 135/88 | HR 77 | Temp 97.2°F

## 2015-08-24 DIAGNOSIS — R1313 Dysphagia, pharyngeal phase: Secondary | ICD-10-CM | POA: Insufficient documentation

## 2015-08-24 DIAGNOSIS — R490 Dysphonia: Secondary | ICD-10-CM | POA: Insufficient documentation

## 2015-08-24 DIAGNOSIS — J3800 Paralysis of vocal cords and larynx, unspecified: Secondary | ICD-10-CM

## 2015-08-24 DIAGNOSIS — J3801 Paralysis of vocal cords and larynx, unilateral: Secondary | ICD-10-CM | POA: Insufficient documentation

## 2015-08-24 NOTE — Progress Notes (Signed)
[name] - HEAD AND NECK SURGERY   LARYNGOLOGY H&P/CONSULTATION NOTE    August 24, 2015    CHIEF COMPLAINT     Dysphonia, laryngeal lesion, surveillance     HISTORY OF PRESENT ILLNESS     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 He was last evaluated 01/2015 and was doing well at the time. 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   In the interim he reports no new symptoms. He reports that his voice is stable, serviceable, and reliable. His spouse who accompanies him does report that his voice seems somewhat softer, having to ask him to repeat himself. Patient denies vocal limitation socially or professionally. Denies vocal fatigue or throat discomfort.   He reports rare dysphagia to liquids if he is not careful. Otherwise he is on a regular diet. His weight has been stable. He denies traditional GERD symptoms.   He has a long history of smoking and quit 2013 since his stroke.   He denies difficulty breathing.     MEDICATIONS:  Outpatient Prescriptions Prior to Visit   Medication Sig Dispense Refill   . AmLODIPine Besylate 10 MG Oral Tab 10 mg PO Daily     . Aspirin 81 MG Oral Tab clarify dose/directions PO     . Atorvastatin Calcium (LIPITOR) 10 MG Oral Tab 10 mg PO QHS     . Chlorthalidone 25 MG Oral Tab 1 TABLET EVERY MORNING     . CRESTOR 10 MG Oral Tab      . Ergocalciferol (VITAMIN D2 OR) Take 50,000 Units by mouth every 7 days.     . Gabapentin, PHN, 300 MG Oral Tab take 2 caps at bedtime     . Ibuprofen 200 MG Oral Cap Take 200 mg by mouth daily as needed.     . IRON OR      . Triamcinolone Acetonide 0.1 % External Cream apply to affected area for dermatitis     . Valsartan (DIOVAN) 80 MG Oral Tab take 1 tab po qd       No  facility-administered medications prior to visit.       ALLERGIES:  Review of patient's allergies indicates:  Allergies   Allergen Reactions   . Penicillins Rash     TX FROM [facility]CA         PAST MEDICAL HISTORY:  Stroke 2013  HLD  HTN  CKD  OSA    FAMILY HISTORY:  Family history of head and neck cancer? No    SocialHx:   Occupation: [employer]   Smoking history: Quit 2013    ROS:  A complete review of systems was performed and was negative except as mentioned in HPI, as documented in patient intake form.    P/E:     Vitals:  BP 135/88 mmHg  Pulse 77  Temp(Src) 97.2 F (36.2 C) (Temporal)  SpO2 98%    General: Well nourished male, in no acute distress.   Voice: mildly hypophonic, rough and strained  Head:  NCAT, EOMI, facial motion and sensation symmetric,   Ears:  external auditory canals clear TM with normal bony landmarks.    Nose:  Nares patent, external nose without deformity,   Oral Cavity:  dentition  healthy, gingiva healthy, tongue mobile and midline, palate raises symmetrically  Neuro:  Facial nerve HB Gr I bilaterally.  CN 2-12 intact.    Psych: Normal Affect.   Neck:  Neck without LAD, thyroid non-palpable, laryngotracheal structures midline   Chest:  normal chest rise bilaterally, Breathing pattern:normal  CV: Carotid pulses normal, no palpable thrills  Extremities:  extremities warm and without edema or tremor.     PROCEDURE :    Flexible Videostroboscopy   INDICATION:   [x]    Hoarseness /dysphonia  [x]  Dysphagia     OPERATORS:      Dr. Shawna Orleans, Dr. Freddi Starr   SLPs:  Lyn Records     FINDINGS:     Nasopharynx clear   Palate elevation and closure:  Complete  Base of tongue, vallecula, epiglottis clear, no evidence of papilloma. Few scattered secretions pharyngeal wall and piriform sinus   Subglottis: clear   Compression/ hyperfunction: Moderate lateral compression  Motion: the left true cord is sluggish, the right true cord is mobile   On Stroboscopy:   Mucosa: there is bilateral thinning L>R    Mucosal Wave: increased amplitude, asymmetric    Glottic Closure: small anterior gap, closure with increased pitch and hyperfunction   DESCRIPTION OF PROCEDURE:   The procedure was explained to the patient who verbally agreed to proceed.  Afrin and 4% lidocaine was used as topical mucosal anesthetic. The flexible endoscope was passed through the right nostril with the findings as described above. The scope was gently withdrawn. The patient tolerated the procedure well without complications.  The video was reviewed with the patient and their questions were answered.       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 We reviewed examination findings including no evidence of papilloma, left weakness, bilateral thinning L>R, moderate hyperfunction. Overall patient reports that his voice and swallowing is serviceable without limitations. We discussed management including observation, speech therapy, and surgical intervention including possible bilateral thyroplasty. At this time patient elects for continued observation.   He will follow up in one year, sooner with questions or concerns.       In the meanwhile, thank you again for allowing me to participate in this patient's care. If you have any questions regarding this case, or any others that you would like to discuss, please do not hesitate to contact me here at [facility], [phone]        [place], Ranae Pila, saw and evaluated this patient the day of their clinic visit. I have read and reviewed the note from Dr. Shawna Orleans, Fellow in Laryngology, and agree. I personally performed videostroboscopy on the pt during this clinic exam.  I have reviewed the history with the patient as well as going over the working diagnoses and management  options. I have answered the patient's questions to the best of my ability.

## 2015-08-24 NOTE — Progress Notes (Signed)
SPEECH LANGUAGE PATHOLOGY/LARYNGOLOGY EVALUATION     The following patient identifiers were confirmed today name and date of birth.    General Assessment  Certification From: [id number]  Certification To: [id number]  Date of Symptom Onset : 04/03/12  Start of Care Date: 08/24/15    Visits from start of care: 1    Referring Provider:  [x]  Dr. Freddi Starr,  []  Dr. Cristina Gong  []   Dr. 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 Patient continues to be satisfied with his voice at this time. Patient was encouraged to contact this clinic in the future should he wish to pursue refresher voice therapy. He will return for f/u with MD in 12 months.     PERTINENT MEDICAL/SURGICAL HISTORY:      Past Surgical History   Procedure Laterality Date   . Tympanostomy general anesthesia       Joel Parker has a history of left vocal cord weakness which was treated in the operating room with vocal fold augmentation in the operating room on January 08, 2013. 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     [x]  Laryngeal Surgery    []  MBSS [x]  Dysphagia []  Weight loss [] PNA      []  SOB []  Asthma []  Inhalers  []  Reflux symptoms  []  PPIs    Previous interventions: Prior speech therapy with Deland Pretty, SLP, in 2014 prior to surgery and vocal fold augmentation, July 2014.    Prior Level of Function: Before the onset of the current condition, the patient was able to speak normally.    SUBJECTIVE:     Patient Statement: Patient reported "nothing new". He reported no complaints with voice; however, wife thinks that his voice is less clear.  He reported voice is not limiting and he is able to work without issues. He denied vocal fatigue or worsening voice throughout the day. He reported swallowing is "fine" but he will sometimes choke when drinking if he is swallowing too fast and not paying attention. Weight has been stable. Denied reflux. Denied shortness of breath. He was a smoker in the past.                     Barriers to learning: none                   Preferred Learning style: demonstration, written and verbal        Cultural Practices that Influence Care: none      Pain Rating/Action Taken/Fall report:  See Patient Self Assessment of Pain and falls in medical record in OTO section from today.    VHI-10 Score 10/40 EAT-10 Score 4/40          Voice Usage:  []  Undemanding  []  Intermittent  [x]  Routine  []  Extensive  []  Extraordinary      OBJECTIVE:  Pt. was:    [x]   Alert  [x]   Ready to participate  []   Unable to participate  []   Confused []   Lethargic    CURRENT IMPAIRMENT/LEVEL OF  FUNCTION:  Observations:   Resonance:    [] hypernasal  [] hyponasal   []  WNL  Focus:  []  forward focus [x]  posterior focus        Respiration: []  breath holding   [] stridor inspiratory/expiratory  [] poor coordination of phonation/resp.      [] WNL     Articulation: []  limited orality []  imprecise  []  overarticulation      [x] WNL     Other:  []  pitch breaks []  hard glottal attacks  []  throat clear   []  cough     []  glottal fry    []  tremor    [x]  none     [x]  maximum phonation time - 7 sec       Perceptual Rating Of Vocal Quality:  CAPE-V  The following parameters of voice quality will be rated upon completion of the following tasks:  1. Sustained vowels 2. Sentence production 3. Spontaneous speech    Clinician's Perceptual Assessment using Consensus Auditory Perceptual Evaluation of Voice (CAPE-V)    Overall Severity  26/100  [x] Consistent [] Intermittent  Roughness   26/100  [x] Consistent [] Intermittent  Breathiness   9/100  [x] Consistent [] Intermittent  Strain     14/100  [x] Consistent [] Intermittent  Pitch    15/100  [x] Consistent [] Intermittent   Nature of abnormality: low  Loudness   0/100  [x] Consistent [] Intermittent   Nature of abnormality:          Videostroboscopic Findings:    Patient was then seen with:  [x]  Dr. Freddi Starr,  []  Dr. Cristina Gong  []   Dr. Bjorn Loser  laryngologist, who performed flexible videostroboscopy which revealed the following:    VELOPHARYNGEAL FUNCTION: During production of pressure consonants, counting and alternating nasal/non-nasal CVCV productions:  [x] Complete [] Incomplete  [] Weak/slow coordination [] "bubbling" : location      MUCOSA: L>R TVF thinning. Irregular L TVF edge particularly at anterior-mid cord, tacky mucous which displaced during exam    VOCAL FOLD MOVEMENT:  Right:  [x]   normal   []  sluggish  []  decreased   []   immobile in ___ position  Left:  []   normal   [x]  ?sluggish  []  decreased   []   immobile in ___ position    GLOTTIC CLOSURE:   []   Complete intermittently      [x]   Incomplete:  [x]  anterior gap  []   posterior slit []  spindle []  hourglass   []  full length  []  other:     MUCOSAL WAVE:  Right:  [x]   present []   absent []   reduced    Left:   [x]   present    []   absent    []   reduced    Phase   []  symmetrical [x] asymmetrical     Periodicity:  [x]    periodic    []    aperiodic   []    intermittent Other:  Amplitude:  Right:  []   normal    []   decreased    [x]   increased  Left:  []   normal    []   decreased    [x]   increased    MUSCLE TENSION PATTERN:  [x]   Lateral: L>R (Type II) []   Ant/Post (Type III) []   Sphincteric (Type IV)  []   Splinting    []   BOT recruitment   []  Other:      [x]  mild  []  mod  []  severe   Supraglottis: mild pooling secretions   Observations with Pitch Change: lengthens   Observations with Changes in Loudness: -    Observations  with Treatment Probes: -    TREATMENT AND EDUCATION:    Education:  Plan/recommendations were reviewed with patient/caregiver.  The following were: [x]   taught to patient/caregiver []   given  in a written handout.   [x]  Voice  []  reflux  []  swallow   []  breathing/cough management strategies    [x]  Contact information was provided for further questions.    Response: [x]  Patient/caregiver verbalized understanding,   []  demonstrated strategies,      []  may need further instruction,    []  was unable to learn at this time.      Skilled Services Provided:   [x]   Beh. & qualitative analysis of voice & resonance   []   Speech language voice treatment  []   Swallowing evaluation   []   Swallow treatment  []   Acoustic analysis of Voice      ASSESSMENT:  See clinical impression above.       Rehabilitation potential is: Good  Potential Barriers to achieve rehab goals: none  These goals were discussed and the patient agrees with them.    DISCHARGE GOALS: Functional Discharge Goals are numbered and Short Term Goals are lettered (Time Frame for completion of goals will be determined at first treatment session):   [x]  Evaluation Only  1. Maintain best quality voice for functional communication and participation in community activities.   [x]  1a. Evaluate status of vocal folds.   []  1b. Patient will participate in education of basic anatomy and physiology of vocal fold vibration.   []  1c. Patient will understand and verbalize    []  1d. Patient will express comprehension of good vocal hygiene.   []  1e. Patient will express comprehension of reflux management.   []  34f. Patient will decrease laryngeal tension through laryngeal manipulation/massage.   []  1g. During 2 minutes of spontaneous speech without cues, patient will demonstrate:   []  easy onset/flow phonation   []  forward focus   []  coordinated phonation and respiration   []  increased orality    Patient/Family Input to Goals: Pt. wishes to:  []  improve swallow  []  improve voice quality  [] decrease SOB/Stridor   [] decrease cough [x]  know status of vocal folds []  decrease pain []  other    PLAN OF CARE:   Frequency/Duration of Treatment:   [x] Evaluation only  [] MBSS  [] Pt.  will be seen 1x/week for - weeks for   [] Voice tx    [] Swallowing evaluation with MBSS    [] Swallowing tx   [] Behavioral cough reduction    [] Breathing retraining   [] Other:    This patient will recheck with the referring provider per MD recs in 12 months.       The plan outlined above was formulated, reviewed, and agreed upon with the patient and/or family/caregiver.  Patient/caregiver verbalized understanding and restated information in plan.    Shaul Trautman A. D'Oyley, MS, [place] [facility]  Speech Pathology  [phone]      FUNCTIONAL REPORTING (Medicare only):  Reporting on Goal #1; Assessed at level 4 today using:    [x]  NOMS   []  Modified FOIS  []  Modified [place] Cough Questionnaire  []  Dyspnea Index   []  Other:    Current evaluation:     [] Swallow: G8996     [x] Voice G9171     [] other SLP goal G9174 CK      Goal Status:           [] Swallow: [id number]     [x] Voice G9172     []   other SLP goal G9175 CK                 Discharge Status:  [] Swallow: [id number]     [x] Voice [phone]     [] other SLP goal G9176 CK                Referring Physician/Practitioner Certification Statement:    I Certify the need for these services furnished under this plan of treatment and while under my care.

## 2015-10-01 ENCOUNTER — Inpatient Hospital Stay: Payer: Self-pay

## 2016-09-05 ENCOUNTER — Ambulatory Visit: Payer: Medicare Other | Attending: Otolaryngology | Admitting: Otolaryngology

## 2016-09-05 ENCOUNTER — Ambulatory Visit: Payer: Medicare Other | Attending: Otolaryngology | Admitting: Speech-Language Pathologist

## 2016-09-05 VITALS — BP 137/91 | HR 61 | Wt 230.1 lb

## 2016-09-05 DIAGNOSIS — Z6830 Body mass index (BMI) 30.0-30.9, adult: Secondary | ICD-10-CM

## 2016-09-05 DIAGNOSIS — R490 Dysphonia: Secondary | ICD-10-CM

## 2016-09-05 DIAGNOSIS — I69991 Dysphagia following unspecified cerebrovascular disease: Secondary | ICD-10-CM

## 2016-09-05 DIAGNOSIS — J3801 Paralysis of vocal cords and larynx, unilateral: Secondary | ICD-10-CM | POA: Insufficient documentation

## 2016-09-05 NOTE — Progress Notes (Signed)
[facility]   LARYNGOLOGY OUTPATIENT PROGRESS NOTE    September 05, 2016    CHIEF COMPLAINT     Dysphonia, laryngeal lesion, surveillance     HISTORY OF PRESENT ILLNESS     Mr. 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 He was last evaluated in our clinic on 08/2015, at which time he was doing well and continues to do so, denying any new laryngeal symptoms. He reports that his voice is stable,  and reliable without any limitations. His spouse who accompanies him does report that his voice seems somewhat softer but it isn't causing significant issue at home. He recently retired and doesn't need to use his voice as much as previously. He also denies vocal fatigue or throat discomfort. As before, he reports rare dysphagia of liquids, more so than solids; mostly when he is not mindful of his intake. He has no diet restrictions and his weight has been stable.    Despite how well he continues to do from a laryngeal perspective, he was unfortunately recently diagnosed with stage 1 non-small cell lung cancer in January and is planning on beginning radiation therapy in the near future. Per his wife, the plan is treat with radiation as he "performed poorly" on a post-diagnosis PFT and thus was not the most appropriate surgical candidate. As a reminder, he has a long history of smoking and quit in 2013 since his stroke.      P/E:     Vitals:  BP (!) 137/91   Pulse 61   Wt (!) 230 lb 2 oz (104.4 kg)   BMI 30.50 kg/m     General: Well nourished male, in no acute distress.   Voice: Somewhat rough, but otherwise strong; seemingly similar to last year.      Head:  NCAT, EOMI, facial motion and sensation  symmetric,   Nose:  Nares patent, external nose without deformity,   Oral Cavity:  dentition healthy, gingiva healthy, tongue mobile and midline, palate raises symmetrically, symmetric, FOM clear and soft to palpation, tonsillar fossa and BOT soft to palpation.   Neuro: CN 2-12 intact.    Psych: Normal Affect.   Neck: Neck without LAD, thyroid non-palpable, laryngotracheal structures midline.  Chest:  Normal chest rise bilaterally, breathing pattern: regular.  CV: Carotid pulses normal, no palpable thrills.  Extremities:  extremities warm and without edema or tremor.   Skin: Visual examination of scalp, face and neck, reveals no rashes, ulcerations, nodules or lesions.        Procedure:  PROCEDURE :    Flex Videostroboscopy   INDICATION:   Hoarseness /dysphonia, laryngeal lesion   OPERATORS:      Dr. Freddi Starr and Dr. Carollee Leitz  SLP: Deland Pretty   FINDINGS:     Nasopharynx: Clear   Palate elevation and closure: Complete  Base of tongue, vallecula, epiglottis, and piriform sinuses clear.  Subglottis: Cear  Compression/ hyperfunction: None   Motion: Decreased on right, full but mildly sluggish on left  On Stroboscopy:  Mucosa: Bowing: R > L, left posterior ventricular fold lesion.  Mucosal Wave: Amplitude increased on right, normal on left; phase asymmetric  Glottic Closure: Incomplete: mild, moderate spindle and mild  full length intermittently.   DESCRIPTION OF PROCEDURE:   The procedure was explained to the patient who verbally agreed to proceed.  Afrin and 4% lidocaine was used as topical mucosal anesthetic. The flexible endoscope was passed through the right nostril with the findings as described above. The scope was gently withdrawn. The patient tolerated the procedure well without complications. The video was reviewed with the patient and their questions were answered.       Assessment/Plan:  Mr. 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 Despite his unfortunate diagnosis of lung carcinoma, he is in good spirits and doing well all things considered. We reviewed examination findings including no evidence of a new papilloma and stable vocal cord weakness. He continues to be pleased with his voice and swallowing. He is also not reporting any respiratory symptoms. We will plan to continue with observation of his larynx annually, but did reassure him and his wife that we are here for him as they begin this difficult time in their lives and encouraged them to not hesitate to contact us with any issues, whether or not they may be related to his recent cancer diagnosis. We wished them the best.    In the meanwhile, thank you again for allowing me to participate in this patient's care. If you have any questions regarding this case, or any others that you would like to discuss, please do not hesitate to contact me here at [facility], [phone]        [place], Ranae Pila, saw and evaluated this patient the day of their clinic visit. I have read and reviewed Dr. Zenovia Jarred' note and agree.  I was present for the entire endoscopic procedure.    I have personally reviewed the history with the patient as well as going over the working diagnoses and management options.  I have answered the patient's questions to the best of my ability.

## 2016-09-05 NOTE — Progress Notes (Addendum)
SPEECH LANGUAGE PATHOLOGY/LARYNGOLOGY EVALUATION     The following patient identifiers were confirmed today name and date of birth.  Date: 09/05/2016  Time: 14:30  Duration: 15 min    General Assessment  Certification From: 55/73/22  Certification To: 02/54/27  Date of Symptom Onset : 04/03/12  Start of Care Date: 09/05/16    Visits from start of care: 1    Referring Provider: Dr. Freddi Parker  Treatment/Therapy Diagnosis:   Problem List Items Addressed This Visit     Dysphonia - Primary        CLINICAL IMPRESSION: Pt. is a 71 year old male with moderate rough/strained dysphonia but overall stable laryngeal exam since the time of patient's last visit. Patient continues to be satisfied with his voice at this time. Patient was encouraged to contact this clinic in the future should he wish to pursue refresher voice therapy. He will return for f/u with MD in 12 months.     PERTINENT MEDICAL/SURGICAL HISTORY:      Past Surgical History   Procedure Laterality Date   . Tympanostomy general anesthesia       [name] has a history of left vocal cord weakness which was treated in the operating room with vocal fold augmentation in the operating room on January 08, 2013. 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     Previous interventions: Prior speech therapy with Joel Parker, SLP, in 2014 prior to surgery and vocal fold augmentation, July 2014.    Prior Level of Function: Before the onset of the current condition, the patient was able to speak normally.    SUBJECTIVE:     Patient Statement: Patient reported That he has early lung cancer which will be treated soon with radiation.  He has noticed that his voice has returned to low pitched posterior tone focus, however, he is not currently concerned with his voice.  He is mainly here to follow up on epiglottic lesion and ensure that there have been no changes to his laryngeal  exam.                     Barriers to learning: none                   Preferred Learning style: demonstration, written and verbal        Cultural Practices that Influence Care: none      Pain Rating/Action Taken/Fall report:  See Patient Self Assessment of Pain and falls in medical record in OTO section from today.    VHI-10 Score 8/40 EAT-10 Score 4/40   RSI: 7/45  DI: 3/40        Voice Usage:   Routine    OBJECTIVE:  Pt. was:   Alert and Ready to participate     CURRENT IMPAIRMENT/LEVEL OF FUNCTION:  Observations:   Resonance:    WNL  Focus:  posterior focus        Respiration:   poor coordination of phonation/resp.          Articulation: WNL     Other: none      Perceptual Rating Of Vocal Quality:  CAPE-V  The following parameters of voice quality will be rated upon completion of the following tasks:  1. Sustained vowels 2. Sentence production 3. Spontaneous speech    Clinician's Perceptual Assessment using Consensus Auditory Perceptual Evaluation of Voice (CAPE-V)  Overall Severity  37/100  Consistent   Roughness   27/100  Consistent   Breathiness   0/100  Consistent   Strain    18/100  Consistent   Pitch    37/100  Consistent   Nature of abnormality: low  Loudness   10/100  Consistent   Nature of abnormality:decreased          Videostroboscopic Findings:    Patient was then seen with: Dr. Freddi Parker, laryngologist, who performed flexible videostroboscopy which revealed the following:    MUCOSA: Bowing: R > L, left posterior ventricular fold lesion.    VOCAL FOLD MOVEMENT:  Right: decreased  Left: sluggish     GLOTTIC CLOSURE: Incomplete: mild, moderate spindle and mild full length intermittently    VIBRATION:   Periodicity: periodic     Phase: asymmetrical  Mucosal Wave:  Right: present  Left: present   Amplitude:  Right: increased  Left: normal     MUSCLE TENSION PATTERN: Lateral (Type II): bilateral; severe  and Sphincteric (Type IV): moderate  intermittent false cord phonation  Supraglottis:  WNL  Subglottis: Not visualized   Tremor: No  Breaks: No  Observations with Pitch Change: Vocal folds lengthen appropriately with increased pitch.  Observations with Changes in Loudness: -    Observations with Treatment Probes: No probes administered     TREATMENT AND EDUCATION:    Education:  Plan/recommendations were reviewed with patient/caregiver.  The following were: Taught to patient/caregiver; voice    Contact information was provided for further questions.     Response: patient/caregiver verbalized understanding and may need further instruction    Skilled Services Provided: Beh. & qualitative analysis of voice & resonance    ASSESSMENT:  See clinical impression above.     Rehabilitation potential is: Fair  Potential Barriers to achieve rehab goals: Currently awaiting treatment for lung cancer.  These goals were discussed and the patient agrees with them.    DISCHARGE GOALS:  1. Maintain best quality voice for functional communication and participation in community activities:    1a. Evaluate status of vocal folds    Goals to be met by 09/05/2016      Patient/Family Input to Goals: Pt. wishes to: improve voice quality and know status of vocal folds    PLAN OF CARE:   Frequency/Duration of Treatment: Evaluation only     This patient will recheck with the referring provider per MD recs.  in 12 months.    The plan outlined above was formulated, reviewed, and agreed upon with the patient and/or family/caregiver.  Patient/caregiver verbalized understanding and restated information in plan.    Joel Pretty, MS, [place] of [facility]  Voicemail: [phone]  Pager: [phone]      FUNCTIONAL REPORTING (Medicare only):  Reporting on Goal # 1; Assessed at level 4 today using NOMS.   Current evaluation: Voice: G9171 C K  Goal Status: Voice: G9172 C K                  Discharge Status: Voice: [id number] C K              Referring Physician/Practitioner  Certification Statement:    I Certify the need for these services furnished under this plan of treatment and while under my care.

## 2016-09-26 ENCOUNTER — Inpatient Hospital Stay: Payer: Self-pay

## 2017-03-07 ENCOUNTER — Ambulatory Visit: Payer: Medicare Other | Attending: Physician Assistant | Admitting: Physician Assistant

## 2017-03-07 ENCOUNTER — Encounter (INDEPENDENT_AMBULATORY_CARE_PROVIDER_SITE_OTHER): Payer: Self-pay | Admitting: Physician Assistant

## 2017-03-07 VITALS — Wt 241.0 lb

## 2017-03-07 DIAGNOSIS — M25561 Pain in right knee: Secondary | ICD-10-CM | POA: Insufficient documentation

## 2017-03-07 NOTE — Progress Notes (Signed)
Primary Care Physician: Pcp, Outside    Referring Physician: Self referred      Joel Parker is a 71 year old male with a 1 year long history of right knee pain.  The pain has been progressive.  In the past he was able to manage his pain on his own with conservative options.  He no longer feels he is able to do so.  He presents today with his spouse.  He describes the pain as a deep constant ache with sharp pains while weightbearing.  He has been told he has arthritis and presents today to discuss his options.    He is recovering from a left lower lobectomy for cancer.  He is also post CVA for uncontrolled HTN.    The pain began gradually; there was not history of trauma or discrete injury.     The pain is located along the medial joint line. It is worse with getting up from seated positions after prolonged sitting. As well as going up and down stairs or inclines, and relieved by non-weightbearing. It keeps him from walking for exercise.    Joel Parker principal limitation is right knee pain, and so he presents for evaluation.    Functional history reveals that he can walk 2-3 blocks before needing to stop or rest.  Joel Parker can walk without using assistive devices.  Joel Parker does stairs needing a banister, one step at a time, and can sit in any chair, at least 1 hour.  Joel Parker arises from a chair needing arm assistance, and can reach socks and shoes easily.    On a visual-analogue pain scale, he rates the pain as a 0-1 out of 10 in severity at rest, and as a 4-5 out of 10 in severity with weight bearing; he has tried activity modifications, intra-articular corticosteroid injections, synvisc injections and APAP to relieve the pain with limited success.    Joel Parker has the following drug allergies: Penicillins    Joel Parker past medical history is significant for sleep apnea (uses CPAP), lung cancer, COPD, HTN and stroke/TIA    Joel Parker has a current medication list which includes the following prescription(s):  amlodipine besylate, aspirin, atorvastatin calcium, chlorthalidone, crestor, ergocalciferol, gabapentin (once-daily), ibuprofen, iron, rosuvastatin calcium, tiotropium bromide-olodaterol, triamcinolone acetonide, and valsartan.    Joel Parker family history is significant for arthritis, lung and brain cancer and high blood pressure.    Joel Parker past surgical history includes: left lower lobectomy in 2018, throat in 2016 and tonsillectomy in 1959.    Social history: Employment - Retired Copywriter, advertising . Marital status - married. Children - 2. Tobacco - Quit 06/2016.  Has 50 year history. Alcohol - 3 per week. Stairs into house - 3. Stairs to bedroom - 10.    Comprehensive ROS is significant for right knee pain and glasses.  Joel Parker denies shortness of breath, chest pain or difficulty breathing.  The remaining systems out of a total of 14 (see standard form) are negative.    __________________________  (no height taken for this visit)  Wt 241 lb (109.317 kg)  Body mass index is 31.94 kg/m.  __________________________    On physical exam, Joel Parker looks his stated age, and he is oriented to time, place, and person.  He demonstrates no excessive respiratory effort.  His gait is moderately antalgic. The overall limb alignment is varus with weight bearing on the right, and varus on the left.  Exam of the right hip:  No visible atrophy.  No pain with active straight-leg raise, no pain or crepitus with passive rotation. Greater trochanter is non-tender to palpation.  Quad strength 5/5 against resistance.  Full, supple, pain-free passive ROM.     Exam of the left hip:  No visible atrophy.  No pain with active straight-leg raise, no pain or crepitus with passive rotation. Greater trochanter is non-tender to palpation.  Quad strength 5/5 against resistance.  Full, supple, pain-free passive ROM.     Exam of the right knee:  Varus.  Quads are well developed.  Large effusion.  No atrophy.  Patella tracks  centrally.  Passive full ROM, without pain.  Moderater tenderness to palpation of his lateral patellar facets.  Mild tenderness along his medial joint line.  Mild pain with deep forced flexion, quad strength 5/5 against resistance.  Ligaments are stable in the coronal and sagittal planes, without pain.  Patellar grind test is negative.     Exam of the left knee:  Varus.  Quads are well developed.  No effusion or atrophy.  Patella tracks centrally.  Passive full ROM, without pain.  No tenderness to palpation of patellar facets or joint lines.  No pain with deep forced flexion, quad strength 5/5 against resistance.  Ligaments are stable in the coronal and sagittal planes, without pain.  Patellar grind test is negative.     Leg length: Equal      Distal neurocirculatory exam: Pulses intact (2+ PT and DP) bilaterally, sensation intact to light touch bilaterally, EHL motor strength 5/5 bilaterally.    Skin temperature and turgor are normal throughout the lower extremities, without cyanosis, swelling, varicosities, or lymphadenopathy.    __________________________      Independent visualization of the radiographic exam: Bilateral knees how severe medial compartment degenerative changes.    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    We reviewed the natural history and treatment options for the non-operative treatment of degenerative joint disease of the knee. Non-operative options we discussed included activity modification, use of a cane, a walker, an arthritis brace, neutraceuticals, oral analgesics, non-steroidal anti-inflammatory medications, bursal injections with corticosteroids and intra-articular injections (corticosteroids and hylan-based products). We discussed the risks and benefits of each treatment option, and I spent some time answering Joel Parker questions. Following this discussion, Joel Parker decided to try activity modification, weight  loss, a cane, neutraceuticals, acetaminophen and an intra-articular corticosteroid injection. We discussed that, should non-operative treatment fail to provide Joel Parker with lasting relief, surgical options might include knee replacement.    Following our conversation, Joel Parker decided to try an intra-articular corticosteroid injection into his right knee. I confirmed his identity and the site of the injection.  We discussed the risks of this procedure, including the risk of infection (perhaps requiring surgical treatment), increased symptoms (steroid flare), and skin depigmentation. We discussed that the shot may be repeated at a subsequent time, but that excessive frequency of these injections is unlikely to be beneficial. I answered Joel Parker questions, and marked the site before performing the injection with 59ml-lidocaine 1% and 40ml of triamcinolone 40mg /ml. This was performed under sterile conditions with some relief.    We discussed that his medical risk may be higher than average because of his history of CVA, sleep apnea and lung cancer. We discussed that this can increase the risk of a severe or even life-threatening medical complication  after surgery. We had a good talk about this, and he understands.    We discussed that although the literature is not consistent on this, I believe that morbid obesity may be a risk factor for perioperative medical complications, iatrogenic ligament injury, joint infection, and premature failure of the reconstruction.    We discussed that patients who have obstructive sleep apnea are at risk for life-threatening respiratory complications after surgery, and such patients need to use CPAP to try to mitigate this risk.    He is going to try and use a cane and see how much benefit he receives from the injection.  If his relief is only short lived,he will RTC on a day that Dr. Desiree Lucy here as well.    Thank you again for allowing me to participate in the care of this  very pleasant patient. I will keep you posted when he returns for follow-up.    Warmest regards,      Ledora Bottcher, PA-C  Teaching Associate  Adult Joint Reconstruction   [facility] [place], [place]

## 2017-10-27 ENCOUNTER — Encounter (HOSPITAL_BASED_OUTPATIENT_CLINIC_OR_DEPARTMENT_OTHER): Payer: Medicare Other

## 2017-10-27 ENCOUNTER — Ambulatory Visit: Payer: Medicare Other | Attending: Otolaryngology | Admitting: Otolaryngology

## 2017-10-27 VITALS — BP 130/80 | HR 65 | Temp 97.5°F | Ht 73.0 in | Wt 234.4 lb

## 2017-10-27 DIAGNOSIS — J387 Other diseases of larynx: Secondary | ICD-10-CM | POA: Insufficient documentation

## 2017-10-27 DIAGNOSIS — Z87891 Personal history of nicotine dependence: Secondary | ICD-10-CM

## 2017-10-27 DIAGNOSIS — R490 Dysphonia: Secondary | ICD-10-CM | POA: Insufficient documentation

## 2017-10-27 NOTE — Progress Notes (Signed)
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 He was last evaluated in our clinic on 08/2016, at which time he was doing well.    He continues to do well and have no new laryngeal symptoms. Of note, he underwent removal of his left lower lobe for a nonsmall cell lung cancer; he did not require any adjunct chemo or radiation. He continues to follow up with thoracic Parker. He had no changes to his voice, airway or swallowing perioperatively.     His voice remains serviceable and reliable. It is not as loud as it once was and occasionally sounds "gobbly" but he is not bothered by this at all. He denies any throat discomfort. He also denies dyspnea or new swallowing difficulties. He remains careful with thin liquids (does not drink too quickly) and perhaps once a month liquids go down the wrong pipe. He denies any pneumonias. He denies any significant weight loss.        P/E:     Vitals:    10/27/17 1352   BP: 130/80   BP Cuff Size: Large   BP Site: Right Arm   BP Position: Sitting   Pulse: 65   Temp: 97.5 F (36.4 C)   TempSrc: Temporal   SpO2: 100%   Weight: (!) 234 lb 6.4 oz (106.3 kg)   Height: 6\' 1"  (1.854 m)           General: Well nourished male, in no acute distress.   Voice: Rough, breathy  Head:  NCAT, EOMI, facial motion and sensation symmetric,   Nose:  Nares patent, external nose without deformity,   Oral Cavity:  dentition healthy, gingiva healthy, tongue mobile and midline,  palate raises symmetrically, symmetric; upper and lower lip tremor seen intermittently  Neuro: CN 2-12 intact.    Psych: Normal Affect.   Neck: Neck without LAD, thyroid non-palpable, laryngotracheal structures midline. Low cricoid.  Chest:  Normal chest rise bilaterally, breathing pattern: regular.  CV: Carotid pulses normal, no palpable thrills.  Extremities:  extremities warm and without edema or tremor.   Skin: Visual examination of scalp, face and neck, reveals no rashes, ulcerations, nodules or lesions.        Procedure:  PROCEDURE :    Flex Videostroboscopy   INDICATION:   Hoarseness /dysphonia, laryngeal lesion   OPERATORS:      Dr. Donnelly Angelica and Merati  none   FINDINGS:     Nasopharynx: Clear   Palate elevation and closure: Complete  Base of tongue, vallecula, epiglottis, and piriform sinuses clear. No papillomas seen.  Subglottis: Clear  Compression/ hyperfunction: False cord squeeze, L>R  Motion: bilaterally mobile, more sluggish on the left  On Stroboscopy:  Mucosa: Bowing bilaterally, left posterior ventricular fold bulge noted (does not appear papillomatous)  Mucosal Wave: Amplitude increased on right, normal on left; phase asymmetric  Glottic Closure: Incomplete: anterior gap noted    DESCRIPTION OF PROCEDURE:   The procedure was explained to the patient who  verbally agreed to proceed.  Afrin and 4% lidocaine was used as topical mucosal anesthetic. The flexible endoscope was passed through the right nostril with the findings as described above. The scope was gently withdrawn. The patient tolerated the procedure well without complications. The video was reviewed with the patient and their questions were answered.       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 He is stable with respect to his voice symptoms and does not want any further intervention at  this point in time (he knows that injection and more permanent implant remain options for vocal fold bowing/paresis). There is no further evidence of papilloma on exam today. He will continue to follow up with thoracic Parker colleagues regarding his lung cancer.    He will follow up again in 12 months for further surveillance.    In the meanwhile, thank you again for allowing me to participate in this patient's care. If you have any questions regarding this case, or any others that you would like to discuss, pleasedo nothesitateto contact me here at [facility] Otolaryngology, [phone]      [place], Ranae Pila, saw and evaluated this patient the day of their clinic visit. I have read and reviewed the note from Dr. Donnelly Angelica, Fellow in Laryngology, and agree. I personally performed videostroboscopy on the pt during this clinic exam.  I have reviewed the history with the patient as well as going over the working diagnoses and management options. I have answered the patient's questions to the best of my ability.

## 2017-11-10 ENCOUNTER — Encounter (HOSPITAL_BASED_OUTPATIENT_CLINIC_OR_DEPARTMENT_OTHER): Payer: Medicare Other | Admitting: Otolaryngology

## 2018-06-02 ENCOUNTER — Telehealth (HOSPITAL_BASED_OUTPATIENT_CLINIC_OR_DEPARTMENT_OTHER): Payer: Self-pay | Admitting: Otolaryngology

## 2018-06-02 NOTE — Telephone Encounter (Signed)
RETURN CALL: Voicemail - Detailed Message      SUBJECT:  Appointment Request     REASON FOR VISIT: Yearly follow up  PREFERRED DATE/TIME: Any time in May 2020  ADDITIONAL INFORMATION: Cannot schedule in Epic, no times show available.

## 2018-06-03 NOTE — Telephone Encounter (Signed)
I have left the patient a voicemail to schedule with Dr. Freddi Starr, as the patient requested and left a good call back number.

## 2018-10-29 ENCOUNTER — Encounter (HOSPITAL_BASED_OUTPATIENT_CLINIC_OR_DEPARTMENT_OTHER): Payer: Medicare Other | Admitting: Otolaryngology

## 2018-11-09 ENCOUNTER — Ambulatory Visit: Payer: Medicare Other | Attending: Otolaryngology | Admitting: Otolaryngology

## 2018-11-09 ENCOUNTER — Ambulatory Visit: Payer: Medicare Other | Attending: Otolaryngology | Admitting: Speech-Language Pathologist

## 2018-11-09 VITALS — BP 114/74 | HR 71 | Temp 97.5°F | Ht 74.0 in | Wt 242.0 lb

## 2018-11-09 DIAGNOSIS — R490 Dysphonia: Secondary | ICD-10-CM

## 2018-11-09 DIAGNOSIS — I639 Cerebral infarction, unspecified: Secondary | ICD-10-CM

## 2018-11-09 DIAGNOSIS — J3801 Paralysis of vocal cords and larynx, unilateral: Secondary | ICD-10-CM | POA: Insufficient documentation

## 2018-11-09 NOTE — Progress Notes (Signed)
SPEECH LANGUAGE PATHOLOGY/LARYNGOLOGY EVALUATION   AND INITIAL PLAN OF CARE     The following patient identifiers were confirmed today: name and date of birth.    Time in: 1200 Duration: 15 min   General Assessment  Certification From*: 12/45/80  Certification To*: 99/83/38  Date of Symptom Onset*: 11/09/18  Start of Care Date*: 11/09/18    Visits from start of care: 1  Referring Provider: No ref. 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 He is known to this clinic with L TVF paresis and epiglottic papilloma, for which he has undergone Restylane injection and excision, respectively, in 2014. He presents with mild < moderately rough/strained dysphonia in the setting of largely stable laryngeal exam with L TVF hypomobility/volume loss, incomplete glottic closure, and compensatory hyperfunction. Though he would likely benefit from speech pathology intervention to optimize phonatory efficiency, the patient is not interested at this time as he is meeting functional communicative needs. Should he decline functionally, we would be more than happy to re-evaluate.       PERTINENT MEDICAL/SURGICAL HISTORY:   No past medical history on file.    Past Surgical History:   Procedure Laterality Date   . TYMPANOSTOMY GENERAL ANESTHESIA         HISTORY:  The patient is a 73 year old male known to this clinic for management of L TVF paresis 2/2 stroke as well as epiglottic papilloma, for which he has undergone Restylane injection and CO2 laser excision in 12/2012. He does carry a h/o NSCLC for which he has undergone primary LLL lobectomy. He continues to follow up with thoracic surgery.    SUBJECTIVE:   Pt. was  alert and ready to participate     The patient reports stable voice, breathing, and swallowing. He reports functional use of his voice and is without complaints in spite of deterioration of vocal quality with extensive use. He denies dyspnea. He reports stable but persistent postprandial coughing with liquids (once/week) if he is "not careful." This is mitigated with mindful swallowing. He tolerates a regular diet without weight loss or PNA. No reflux.     OBJECTIVE:    Observations:   Resonance: WNL  Focus: posterior tone focus       Respiration: Poor coordination of phonation with respiration    Articulation: WNL    Other: none     Maximum phonation time -     Perceptual Rating Of Vocal Quality:  CAPE-V  The following parameters of voice quality will be rated upon completion of the following tasks:  1. Sustained vowels 2. Sentence production 3. Spontaneous speech    Clinician's Perceptual Assessment using Consensus Auditory Perceptual Evaluation of Voice (CAPE-V)    Overall Severity  25/100  Constant  Roughness   25/100  Constant   Breathiness   11/100  Constant  Strain    25/100  Constant  Pitch    -/100  Constant  Nature of abnormality: -  Loudness   -/100  Constant  Nature of abnormality: -          Videostroboscopic Findings:  Patient was then seen with: Dr. Ranae Pila, laryngologist, who performed flexible videostroboscopy which revealed the following:    MUCOSA: Bowing: Mild, Moderate and L > R    VOCAL FOLD MOVEMENT:  Right: normal  Left: decreased     GLOTTIC CLOSURE: Incomplete: mild, moderate spindle    VIBRATION:   Periodicity: periodic     Phase: asymmetrical  Mucosal Wave:  Right: present  Left: present   Amplitude:  Right: normal   Left: normal     MUSCLE TENSION PATTERN: Lateral (Type II): left; mild and moderate    Supraglottis: papilloma  Subglottis: Not visualized   Tremor: No  Breaks: No  Observations with Pitch Change: Vocal folds lengthen appropriately with increased pitch.  Observations  with Changes in Loudness: -    Observations with Treatment Probes: No probes administered     TREATMENT AND EDUCATION:    Education:  Plan/recommendations were reviewed with patient/caregiver.  The following were: Taught to patient/caregiver; voice    Contact information was provided for further questions.     Response: patient/caregiver verbalized understanding    Skilled Services Provided: Beh. & qualitative analysis of voice & resonance    ASSESSMENT:  See clinical impression above.     Rehabilitation potential is: Good  Potential Barriers to achieve rehab goals: none  Preferred Learning style: verbal  Cultural Practices that Influence Care: none  Patient/Family Input to Goals: Pt. wishes to: know status of vocal folds  These goals were discussed and the patient agrees with them.    DISCHARGE GOALS:  1. Maintain best quality voice for functional communication and participation in community activities:   1a. Evaluate status of vocal folds  1b. Patient will participate in education of basic anatomy and physiology of vocal fold vibration       Goals to be met by today.    PLAN OF CARE:   Frequency/Duration of Treatment: Evaluation only     This patient will recheck with the referring provider per MD recs.    The plan outlined above was formulated, reviewed, and agreed upon with the patient and/or family/caregiver. Patient/caregiver verbalized understanding and restated information in plan.    Referring Physician/Practitioner Certification Statement:     I Certify the need for these services furnished under this plan of treatment and while under my care.    Docia Chuck, MS, CCC-SLP  Speech Pathologist 2  [facility]  Department of Otolaryngology  Speech Pathology  Voicemail: [phone]  Pager: [phone]

## 2018-11-19 NOTE — Progress Notes (Signed)
[facility]   DEPARTMENT OF OTOLARYNGOLOGY - HEAD AND NECK SURGERY   LARYNGOLOGY OUTPATIENT PROGRESS NOTE        CHIEF COMPLAINT   Dysphonia    HISTORY OF PRESENT ILLNESS   Mr. 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  Very little change - no change in his baseline hoarseness.  Swallowing is stable.   He was last evaluated in our clinic on 01-Oct-2017, at which time he was doing well.    He continues to do well and have no new laryngeal symptoms. PLease recall that he also underwent removal of his left lower lobe for a nonsmall cell lung cancer; he did not require any adjunct chemo or radiation. He continues to follow up with thoracic surgery. He had no changes to his voice, airway or swallowing perioperatively.     Marland Kitchen He denies any pneumonias. He denies any significant weight loss.    EXAM    General: Well nourished male, in no acute distress.   Voice: Rough, breathy  Head:  NCAT, EOMI, facial motion and sensation symmetric,   Nose:  Nares patent, external nose without deformity,   Oral Cavity:  dentition healthy, gingiva healthy, tongue mobile and midline, palate raises symmetrically, symmetric;  Psych: Normal Affect.   Neck: Neck without LAD, thyroid non-palpable, laryngotracheal structures midline. Low cricoid.  Chest:  Normal chest rise bilaterally, breathing pattern: regular.  CV: Carotid pulses normal, no palpable thrills.  Extremities:  extremities warm and without edema or tremor.   Skin: Visual examination of scalp, face and neck, reveals no rashes, ulcerations, nodules or lesions.        Procedure:  PROCEDURE :    Flex Videostroboscopy   INDICATION:   Hoarseness /dysphonia, laryngeal lesion   OPERATORS:      Dr. Freddi Starr  none   FINDINGS:     Nasopharynx: Clear   Palate elevation and closure: Complete  Base of tongue, vallecula, epiglottis, and  piriform sinuses clear. No papillomas seen.  Subglottis: Clear  Compression/ hyperfunction: False cord squeeze, L>R - less pronounced than 10-01-2017 exam  Motion: bilaterally mobile, more sluggish on the left  On Stroboscopy:  Mucosa: Bowing bilaterally, left posterior ventricular fold bulge noted (does not appear papillomatous - still no masses.  Mucosal Wave: Amplitude increased on right, normal on left; phase asymmetric  Glottic Closure: Incomplete: anterior gap noted    DESCRIPTION OF PROCEDURE:   The procedure was explained to the patient who verbally agreed to proceed.  Afrin and 4% lidocaine was used as topical mucosal anesthetic. The flexible endoscope was passed through the right nostril with the findings as described above. The scope was gently withdrawn. The patient tolerated the procedure well without complications. The video was reviewed with the patient and their questions were answered.       Assessment/Plan:  Mr. 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 He is stable with respect to his voice symptoms and does not want any further intervention at this point in time (he knows that injection and more permanent implant remain options for vocal fold bowing/paresis). There is no further evidence of papilloma on exam today. He will follow up again in 12 months for further surveillance.    In the meanwhile, thank you  again for allowing me to participate in this patient's care. If you have any questions regarding this case, or any others that you would like to discuss, pleasedo nothesitateto contact me here at [facility] Otolaryngology, [phone]

## 2019-01-26 ENCOUNTER — Inpatient Hospital Stay: Payer: Self-pay

## 2019-09-28 ENCOUNTER — Inpatient Hospital Stay: Payer: Self-pay

## 2019-12-14 ENCOUNTER — Telehealth (HOSPITAL_BASED_OUTPATIENT_CLINIC_OR_DEPARTMENT_OTHER): Payer: Self-pay | Admitting: Otolaryngology

## 2019-12-14 NOTE — Telephone Encounter (Signed)
RETURN CALL: Voicemail - Detailed Message      SUBJECT:  Appointment Request     REASON FOR VISIT: follow up   PREFERRED DATE/TIME: any day / before noon  ADDITIONAL INFORMATION: provider schedule unavailable. Thank you

## 2019-12-15 NOTE — Telephone Encounter (Signed)
Patient returning call he missed from clinic. [facility] attempted to reach clinic but they were busy assisting other patients. Please reach out to patient and assist, thank you.

## 2019-12-15 NOTE — Telephone Encounter (Signed)
I returned call & LVM with the pt to call back/gaa

## 2019-12-15 NOTE — Telephone Encounter (Signed)
Schedule 8/12 Champ Mungo

## 2020-01-09 ENCOUNTER — Other Ambulatory Visit: Payer: Self-pay

## 2020-01-09 ENCOUNTER — Emergency Department (HOSPITAL_BASED_OUTPATIENT_CLINIC_OR_DEPARTMENT_OTHER)
Admission: EM | Admit: 2020-01-09 | Discharge: 2020-01-09 | Disposition: A | Discharge status: Home / Self Care | Payer: Medicare Other | Attending: Emergency Medicine | Admitting: Emergency Medicine

## 2020-01-09 ENCOUNTER — Encounter (HOSPITAL_BASED_OUTPATIENT_CLINIC_OR_DEPARTMENT_OTHER): Payer: Self-pay | Admitting: Emergency Medicine

## 2020-01-09 ENCOUNTER — Emergency Department (HOSPITAL_BASED_OUTPATIENT_CLINIC_OR_DEPARTMENT_OTHER): Payer: Medicare Other

## 2020-01-09 DIAGNOSIS — Z87891 Personal history of nicotine dependence: Secondary | ICD-10-CM | POA: Diagnosis not present

## 2020-01-09 DIAGNOSIS — M79602 Pain in left arm: Secondary | ICD-10-CM | POA: Diagnosis not present

## 2020-01-09 DIAGNOSIS — I10 Essential (primary) hypertension: Secondary | ICD-10-CM | POA: Insufficient documentation

## 2020-01-09 DIAGNOSIS — R0789 Other chest pain: Secondary | ICD-10-CM | POA: Diagnosis present

## 2020-01-09 DIAGNOSIS — M79645 Pain in left finger(s): Secondary | ICD-10-CM | POA: Diagnosis not present

## 2020-01-09 DIAGNOSIS — J45909 Unspecified asthma, uncomplicated: Secondary | ICD-10-CM | POA: Insufficient documentation

## 2020-01-09 HISTORY — DX: Essential (primary) hypertension: I10

## 2020-01-09 HISTORY — DX: Unspecified asthma, uncomplicated: J45.909

## 2020-01-09 LAB — CBC WITH DIFFERENTIAL/PLATELET
Abs Immature Granulocytes: 0.02 10*3/uL (ref 0.00–0.07)
Basophils Absolute: 0.1 10*3/uL (ref 0.0–0.1)
Basophils Relative: 1 %
Eosinophils Absolute: 0.2 10*3/uL (ref 0.0–0.5)
Eosinophils Relative: 4 %
HCT: 45.5 % (ref 39.0–52.0)
Hemoglobin: 14.3 g/dL (ref 13.0–17.0)
Immature Granulocytes: 0 %
Lymphocytes Relative: 32 %
Lymphs Abs: 1.5 10*3/uL (ref 0.7–4.0)
MCH: 27.1 pg (ref 26.0–34.0)
MCHC: 31.4 g/dL (ref 30.0–36.0)
MCV: 86.2 fL (ref 80.0–100.0)
Monocytes Absolute: 0.7 10*3/uL (ref 0.1–1.0)
Monocytes Relative: 15 %
Neutro Abs: 2.3 10*3/uL (ref 1.7–7.7)
Neutrophils Relative %: 48 %
Platelets: 152 10*3/uL (ref 150–400)
RBC: 5.28 MIL/uL (ref 4.22–5.81)
RDW: 13.4 % (ref 11.5–15.5)
WBC: 4.8 10*3/uL (ref 4.0–10.5)
nRBC: 0 % (ref 0.0–0.2)

## 2020-01-09 LAB — BASIC METABOLIC PANEL
Anion gap: 9 (ref 5–15)
BUN: 12 mg/dL (ref 8–23)
CO2: 25 mmol/L (ref 22–32)
Calcium: 8.6 mg/dL — ABNORMAL LOW (ref 8.9–10.3)
Chloride: 106 mmol/L (ref 98–111)
Creatinine, Ser: 1.1 mg/dL (ref 0.61–1.24)
GFR calc Af Amer: >60 mL/min (ref >60)
GFR calc non Af Amer: >60 mL/min (ref >60)
Glucose, Bld: 100 mg/dL — ABNORMAL HIGH (ref 70–99)
Potassium: 3.8 mmol/L (ref 3.5–5.1)
Sodium: 140 mmol/L (ref 135–145)

## 2020-01-09 LAB — TROPONIN I (HIGH SENSITIVITY): Troponin I (High Sensitivity): 8 ng/L (ref <18)

## 2020-01-09 IMAGING — DX DG CHEST 2V
2 series · 2 of 2 positions shown · non-contrast
Comparison: Chest radiograph [DATE]

CLINICAL DATA: Patient states he has had left arm pain X 2 days

EXAM:
CHEST - 2 VIEW

[chest pa]
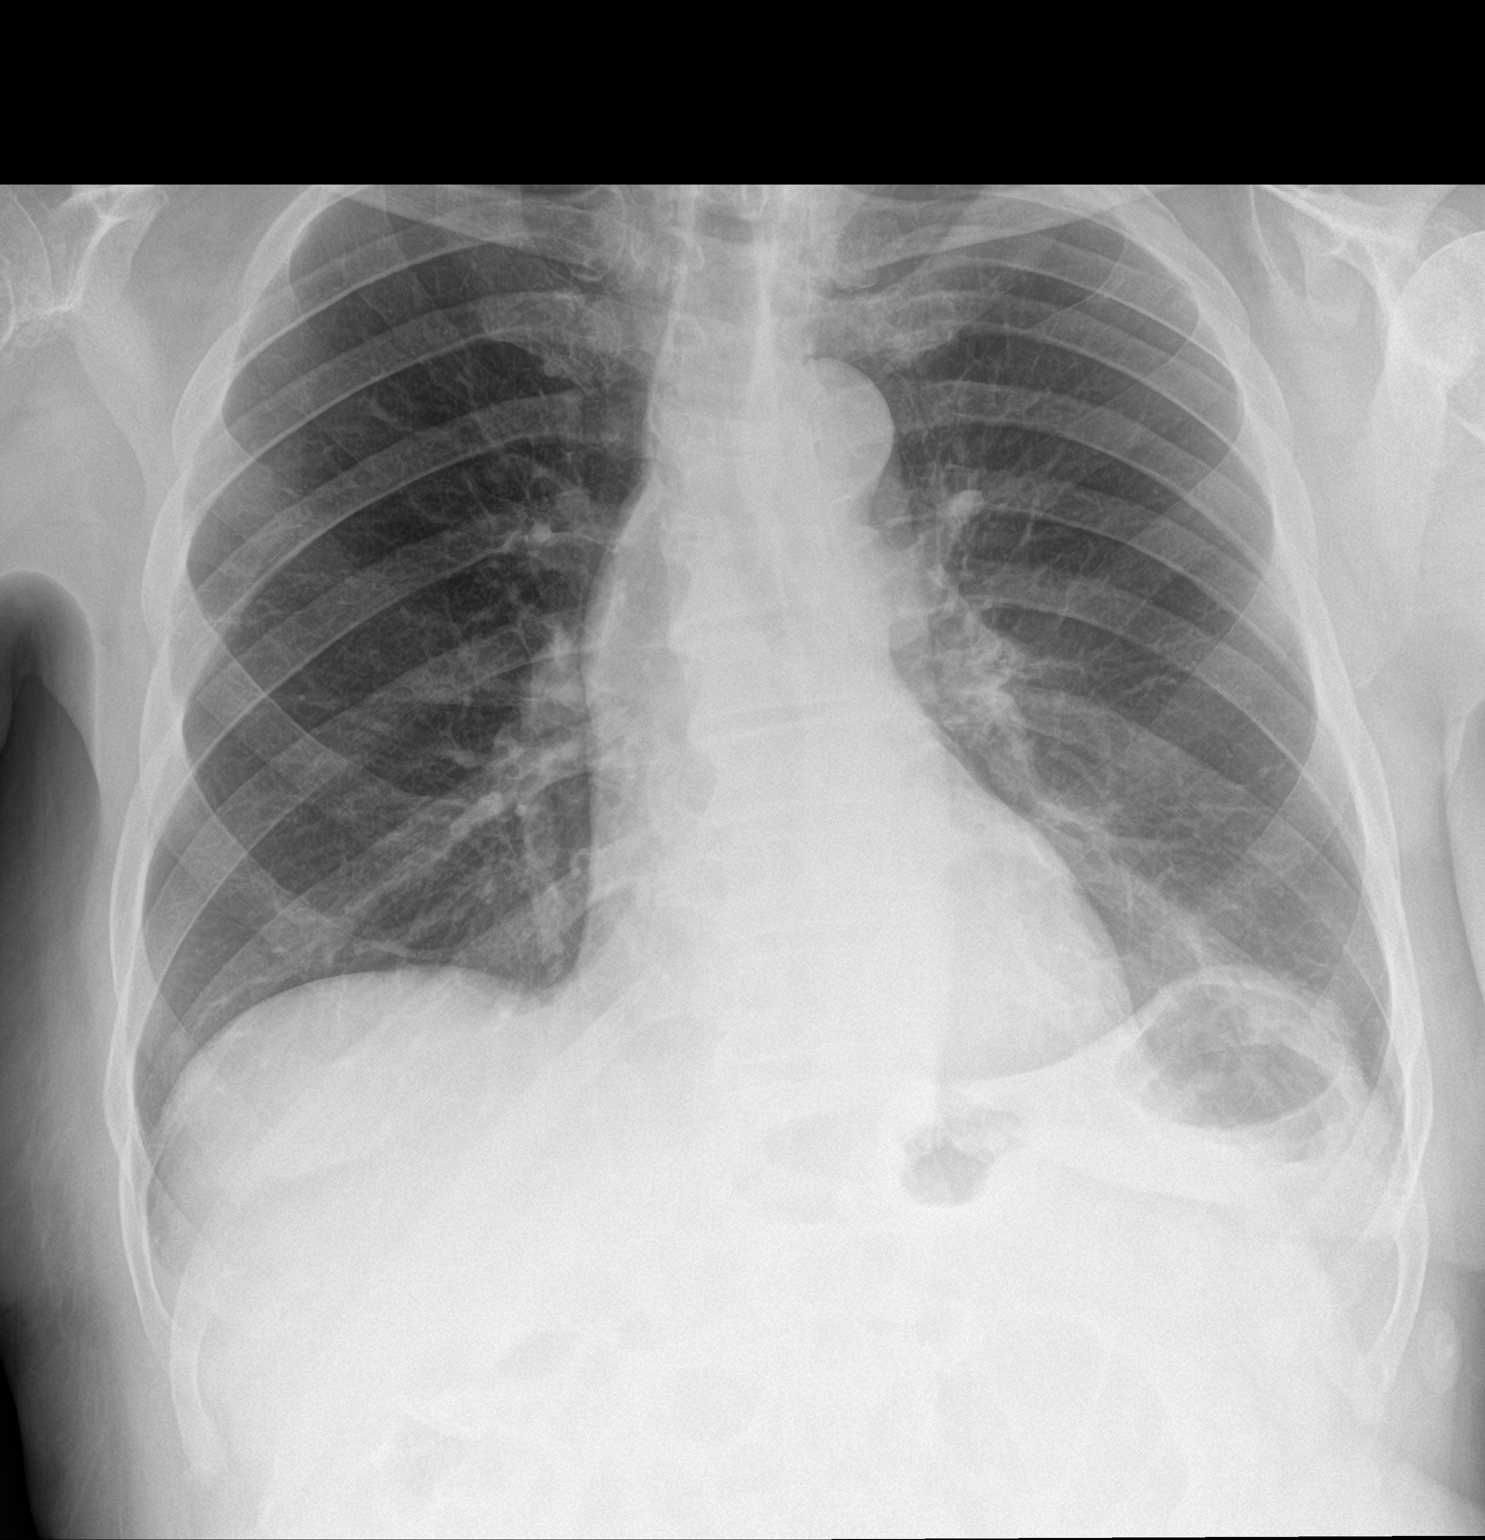

[chest lat]
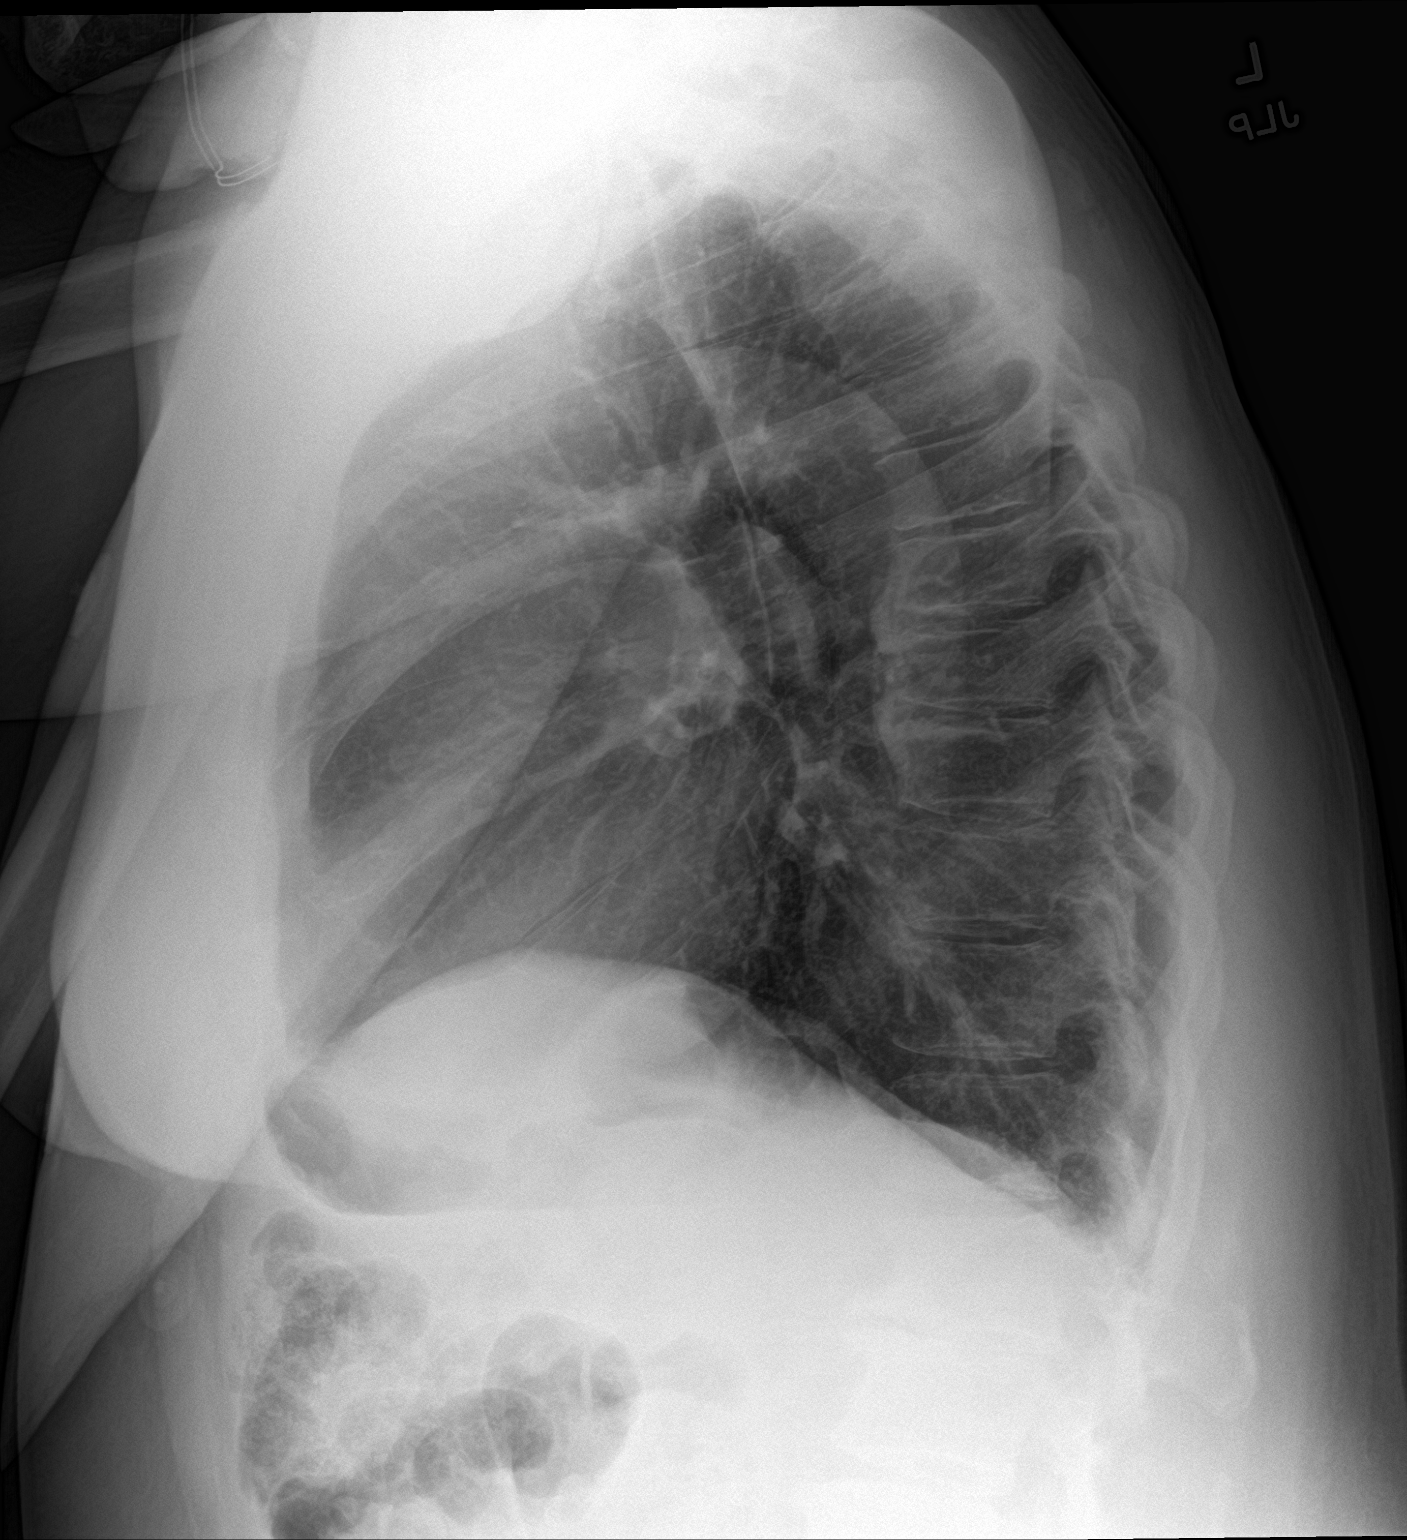

[2 of 2 positions shown; findings below may reference images not displayed]

FINDINGS: The heart size and mediastinal contours are within normal limits.
There are mild bandlike opacities in the bilateral lung bases likely
reflecting atelectasis. No pneumothorax or pleural effusion. No
acute finding in the visualized skeleton.
IMPRESSION: Mild bibasilar atelectasis.

## 2020-01-09 NOTE — ED Provider Notes (Addendum)
[facility] EMERGENCY DEPARTMENT Provider Note   CSN: [id number] Arrival date & time: 01/09/20  1232     History Chief Complaint  Patient presents with  . Arm Pain    Shannon [name] is a 74 y.o. male.  Patient with history of asthma, hypertension who presents the ED with left arm pain.  Patient concerned about possible cardiac process but has been having some left thumb pain and left bicep area pain for the last several days.  Maybe has some brief episode of chest discomfort during that time.  None currently.  No shortness of breath.  No cough, no fever, no sputum production.  History of arthritis in his hands.  Rotator cuff repair on the right.  Denies any true weakness or numbness in his arms chest has difficulty with moving sometimes due to pain.  The history is provided by the patient.  Illness Severity:  Mild Onset quality:  Gradual Timing:  Intermittent Progression:  Waxing and waning Chronicity:  New Associated symptoms: chest pain   Associated symptoms: no abdominal pain, no cough, no diarrhea, no ear pain, no fatigue, no fever, no headaches, no loss of consciousness, no rash, no rhinorrhea, no shortness of breath, no sore throat and no vomiting        Past Medical History:  Diagnosis Date  . Asthma   . Hypertension     There are no problems to display for this patient.   Past Surgical History:  Procedure Laterality Date  . ROTATOR CUFF REPAIR Right   . SPINE SURGERY         No family history on file.  Social History   Tobacco Use  . Smoking status: Former Games developer  . Smokeless tobacco: Never Used  Vaping Use  . Vaping Use: Never used  Substance Use Topics  . Alcohol use: Not Currently  . Drug use: Never    Home Medications Prior to Admission medications   Not on File    Allergies    Iodine and Penicillins  Review of Systems   Review of Systems  Constitutional: Negative for chills, fatigue and fever.  HENT: Negative for ear  pain, rhinorrhea and sore throat.   Eyes: Negative for pain and visual disturbance.  Respiratory: Negative for cough and shortness of breath.   Cardiovascular: Positive for chest pain. Negative for palpitations.  Gastrointestinal: Negative for abdominal pain, diarrhea and vomiting.  Genitourinary: Negative for dysuria and hematuria.  Musculoskeletal: Positive for arthralgias. Negative for back pain.  Skin: Negative for color change and rash.  Neurological: Negative for dizziness, tremors, seizures, loss of consciousness, syncope, facial asymmetry, speech difficulty, weakness, light-headedness, numbness and headaches.  All other systems reviewed and are negative.   Physical Exam Updated Vital Signs BP (!) 156/137 (BP Location: Right Arm)   Pulse 90   Temp 99.7 F (37.6 C) (Oral)   Resp 16   Ht 5\' 11"  (1.803 m)   Wt 102.5 kg   SpO2 99%   BMI 31.52 kg/m   Physical Exam Vitals and nursing note reviewed.  Constitutional:      General: He is not in acute distress.    Appearance: He is well-developed. He is not ill-appearing.  HENT:     Head: Normocephalic and atraumatic.     Nose: Nose normal.     Mouth/Throat:     Mouth: Mucous membranes are moist.  Eyes:     Extraocular Movements: Extraocular movements intact.     Conjunctiva/sclera: Conjunctivae normal.  Pupils: Pupils are equal, round, and reactive to light.  Cardiovascular:     Rate and Rhythm: Normal rate and regular rhythm.     Pulses: Normal pulses.     Heart sounds: Normal heart sounds. No murmur heard.   Pulmonary:     Effort: Pulmonary effort is normal. No respiratory distress.     Breath sounds: Normal breath sounds.  Abdominal:     General: Abdomen is flat.     Palpations: Abdomen is soft.     Tenderness: There is no abdominal tenderness.  Musculoskeletal:        General: Tenderness present. Normal range of motion.     Cervical back: Normal range of motion and neck supple.     Comments: Some tenderness  to palpation within the left bicipital area, left shoulder area  Skin:    General: Skin is warm and dry.     Capillary Refill: Capillary refill takes less than 2 seconds.  Neurological:     General: No focal deficit present.     Mental Status: He is alert.  Psychiatric:        Mood and Affect: Mood normal.     ED Results / Procedures / Treatments   Labs (all labs ordered are listed, but only abnormal results are displayed) Labs Reviewed  BASIC METABOLIC PANEL - Abnormal; Notable for the following components:      Result Value   Glucose, Bld 100 (*)    Calcium 8.6 (*)    All other components within normal limits  CBC WITH DIFFERENTIAL/PLATELET  TROPONIN I (HIGH SENSITIVITY)    EKG EKG Interpretation  Date/Time:  Sunday January 09 2020 12:55:13 EDT Ventricular Rate:  97 PR Interval:  172 QRS Duration: 92 QT Interval:  346 QTC Calculation: 439 R Axis:   -59 Text Interpretation: Normal sinus rhythm Left anterior fascicular block Abnormal ECG Confirmed by Virgina Norfolk [phone]) on 01/09/2020 1:09:06 PM   Radiology DG Chest 2 View  Result Date: 01/09/2020 CLINICAL DATA:  Patient states he has had left arm pain X 2 days that radiates into the chest. Patient denies SOB or cough. EXAM: CHEST - 2 VIEW COMPARISON:  Chest radiograph 09/30/2008 FINDINGS: The heart size and mediastinal contours are within normal limits. There are mild bandlike opacities in the bilateral lung bases likely reflecting atelectasis. No pneumothorax or pleural effusion. No acute finding in the visualized skeleton. IMPRESSION: Mild bibasilar atelectasis. Electronically Signed   By: Emmaline Kluver M.D.   On: 01/09/2020 14:00    Procedures Procedures (including critical care time)  Medications Ordered in ED Medications - No data to display  ED Course  I have reviewed the triage vital signs and the nursing notes.  Pertinent labs & imaging results that were available during my care of the patient were  reviewed by me and considered in my medical decision making (see chart for details).    MDM Rules/Calculators/A&P                          Shannon [name] is a 74 year old male with history of asthma, hypertension who presents to the ED with left arm pain.  Patient with unremarkable vitals.  No fever.  States that he has been having some pain in his left thumb and hand.  Felt some paresthesias.  Has some chronic weakness in the left index finger but may be noticed some in his thumb.  Having some pain when he moves his  left arm up and down at the bicep area and shoulder area.  Denies any trauma.  Patient thought he was having some chest pain with this as well several days ago but none currently.  EKG shows sinus rhythm.  No ischemic changes.  Troponin normal.  Doubt cardiac process.  History and physical not consistent with a stroke or TIA.  Has some reproducible tenderness in the left arm.  Suspect MSK type pain/arthritis.  No significant anemia, electrolyte abnormality, kidney injury.  Chest x-ray shows no signs of infection.  Given reassurance and understands return precautions.  Discharged from the ED in good condition.  This chart was dictated using voice recognition software.  Despite best efforts to proofread,  errors can occur which can change the documentation meaning.     Final Clinical Impression(s) / ED Diagnoses Final diagnoses:  Left arm pain    Rx / DC Orders ED Discharge Orders    None       Virgina Norfolk, DO 01/09/20 1416    Virgina Norfolk, DO 01/09/20 1416

## 2020-01-09 NOTE — ED Triage Notes (Signed)
Pt reports about a week ago had trouble using his left thumb due to numbness and weakness.  Pt reports after that he has been experiencing left arm aches and pains. Pt reports feeling "gassy pain" in his chest as well.

## 2020-01-09 NOTE — Discharge Instructions (Addendum)
Please return to the ED if symptoms worsen.

## 2020-01-09 NOTE — ED Notes (Addendum)
ED Provider at bedside. EKG given to Dr. Lockie Mola to review

## 2020-01-26 ENCOUNTER — Other Ambulatory Visit: Payer: Self-pay

## 2020-01-26 ENCOUNTER — Encounter: Payer: Self-pay | Admitting: Family Medicine

## 2020-01-26 ENCOUNTER — Ambulatory Visit (INDEPENDENT_AMBULATORY_CARE_PROVIDER_SITE_OTHER): Payer: Medicare Other | Admitting: Family Medicine

## 2020-01-26 VITALS — BP 159/90 | HR 92 | Temp 98.5°F | Resp 16 | Ht 70.0 in | Wt 222.6 lb

## 2020-01-26 DIAGNOSIS — R29898 Other symptoms and signs involving the musculoskeletal system: Secondary | ICD-10-CM

## 2020-01-26 DIAGNOSIS — Z9889 Other specified postprocedural states: Secondary | ICD-10-CM | POA: Diagnosis not present

## 2020-01-26 DIAGNOSIS — Z8669 Personal history of other diseases of the nervous system and sense organs: Secondary | ICD-10-CM

## 2020-01-26 MED ORDER — AMLODIPINE BESYLATE 10 MG PO TABS: 10.0000 mg | ORAL_TABLET | Freq: Every day | ORAL | 0 refills | Status: DC

## 2020-01-26 NOTE — Progress Notes (Signed)
See my note in EMR from same date. Signed:  Santiago Bumpers, MD           01/26/2020

## 2020-01-26 NOTE — Progress Notes (Signed)
Office Note 01/26/2020  CC:  Chief Complaint  Patient presents with  . Establish Care    Last PCP was at least 20 years ago   HPI:  Shannon [name] is a 74 y.o. White male who is here to establish care and discuss recent L arm problems. Patient's most recent primary MD: see above. Old records in EPIC/[facility] EMR reviewed prior to or during today's visit.  Pt went to [facility] ED 01/09/20.   I reviewed all records from that encounter today.  Presented with L arm pain, some CP, atypical for cardiac etiology. Reproducible pain upon palpation and arm ROM, some other findings c/w musculoskeletal etiology, cardiac eval including EKG, troponins, and CXR all unremarkable.  Lytes/cr, CBC all normal. BP was up at 161/90, HR 71. No meds were rx'd.  Since the ED visit he still has some L arm pain intermittent from ant left shoulder down flexor aspect of arm to level of mid forearm.  Sounds like chronic. No meds taken.  Denies weakness in legs.  No weakness in R arm or L arm,  but still with intermittent R shoulder pain particularly when lying on it. Says left arm and hand weakness has been progressing for the last couple years.  .    Past Medical History:  Diagnosis Date  . Asthma   . 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 Newell Coral, 1992).  . Hay fever   . Hypertension   . Osteoarthritis of both hands     Past Surgical History:  Procedure Laterality Date  . INCISION AND DRAINAGE Left 2012   L hand infection  . ROTATOR CUFF REPAIR Right    x 3  . SPINE SURGERY      Family History  Problem Relation Age of Onset  . Heart attack Mother   . Heart disease Mother   . High blood pressure Mother   . Alcohol abuse Father   . Cancer Father   . Diabetes Father   . High blood pressure Father     Social History   Socioeconomic History  . Marital status: Divorced    Spouse name: Not on file  . Number  of children: Not on file  . Years of education: Not on file  . Highest education level: Not on file  Occupational History  . Not on file  Tobacco Use  . Smoking status: Former Games developer  . Smokeless tobacco: Never Used  Vaping Use  . Vaping Use: Never used  Substance and Sexual Activity  . Alcohol use: Not Currently  . Drug use: Never  . Sexual activity: Not on file  Other Topics Concern  . Not on file  Social History Narrative   Divorced, one daughter.   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   Veteran: Electronics engineer.  Last went to [place] [facility].   Tob: former.   Alc: no   Social Determinants of Corporate investment banker Strain:   . Difficulty of Paying Living Expenses:   Food Insecurity:   . Worried About Programme researcher, broadcasting/film/video in the Last Year:   . Barista in the Last Year:   Transportation Needs:   . Freight forwarder (Medical):   Marland Kitchen Lack of Transportation (Non-Medical):   Physical Activity:   . Days of Exercise per Week:   . Minutes of Exercise per Session:  Stress:   . Feeling of Stress :   Social Connections:   . Frequency of Communication with Friends and Family:   . Frequency of Social Gatherings with Friends and Family:   . Attends Religious Services:   . Active Member of Clubs or Organizations:   . Attends Banker Meetings:   Marland Kitchen Marital Status:   Intimate Partner Violence:   . Fear of Current or Ex-Partner:   . Emotionally Abused:   Marland Kitchen Physically Abused:   . Sexually Abused:     Outpatient Encounter Medications as of 01/26/2020  Medication Sig  . amLODipine (NORVASC) 10 MG tablet Take 1 tablet (10 mg total) by mouth daily.   No facility-administered encounter medications on file as of 01/26/2020.    Allergies  Allergen Reactions  . Iodine Swelling  . Penicillins     ROS Review of Systems  Constitutional: Negative for appetite change, chills, fatigue and fever.  HENT: Negative for congestion, dental problem, ear pain and sore  throat.   Eyes: Negative for discharge, redness and visual disturbance.  Respiratory: Negative for cough, chest tightness, shortness of breath and wheezing.   Cardiovascular: Negative for chest pain, palpitations and leg swelling.  Gastrointestinal: Negative for abdominal pain, blood in stool, diarrhea, nausea and vomiting.  Genitourinary: Negative for difficulty urinating, dysuria, flank pain, frequency, hematuria and urgency.  Musculoskeletal: Positive for arthralgias (shoulders; generalized joint stiffness). Negative for back pain, joint swelling, myalgias and neck stiffness.  Skin: Negative for pallor and rash.  Neurological: Negative for dizziness, speech difficulty, weakness and headaches.  Hematological: Negative for adenopathy. Does not bruise/bleed easily.  Psychiatric/Behavioral: Negative for confusion and sleep disturbance. The patient is not nervous/anxious.     PE; Vitals with BMI 01/26/2020 01/09/2020 01/09/2020  Height 5\' 10"  - -  Weight 222 lbs 10 oz - -  BMI 31.94 - -  Systolic 159 161 -  Diastolic 90 90 -  Pulse 92 77 84  O2 sat on RA today is 95%  Gen: Alert, well appearing.  Patient is oriented to person, place, time, and situation. Moves slowly, obvious generalized weakness. : no injection, icteris, swelling, or exudate.  EOMI, PERRLA. Mouth: lips without lesion/swelling.  Oral mucosa pink and moist. Oropharynx without erythema, exudate, or swelling.  Neck - No masses or thyromegaly or limitation in range of motion.  Carotids 1+,no bruit. CV: RRR, no m/r/g.   LUNGS: CTA bilat, nonlabored resps, good aeration in all lung fields. ABD: soft, NT, ND, BS normal.  No hepatospenomegaly or mass.  No bruits. EXT: no clubbing or cyanosis.  no edema.  SKIN: no jaundic, rash, or pallor. Hands: contraction deformity of fingers of R>L hand.   Neuro: CN 2-12 intact bilaterally, strength 4/5 in proximal and distal upper extremities and lower extremities bilaterally but  question of slightly more weakness in grip of L hand compared to R. No tremor.  No disdiadochokinesis.  No ataxia.  Upper extremity and lower extremity DTRs symmetric.  No pronator drift. Decreased ROM of both shoulders, particularly aBduction  Pertinent labs:  No results found for: TSH Lab Results  Component Value Date   WBC 4.8 01/09/2020   HGB 14.3 01/09/2020   HCT 45.5 01/09/2020   MCV 86.2 01/09/2020   PLT 152 01/09/2020   Lab Results  Component Value Date   CREATININE 1.10 01/09/2020   BUN 12 01/09/2020   NA 140 01/09/2020   K 3.8 01/09/2020   CL 106 01/09/2020   CO2 25 01/09/2020  ASSESSMENT AND PLAN:   1) HTN; untreated. Start amlodipine 10mg  qd.  2) Left arm/hand weakness: hx of chiari I with syringomyelia. Remote hx of surgery for this problem. Slowly progressive L hand weakness, chronic L arm pain.  Not sure if this has to do with his original problems or not but will ask neurosurgery to see him.  3) Osteoarthritis: hands with a bit of deformity, with significant stiffness and only minimal pain. Shoulders with signif dec ROM.  Hx of RC probs and surgery for same. 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 No w/u or treatment at this time but will address this problem further in near future when we recheck him for his bp.  Spent 45 min with pt today reviewing HPI, reviewing relevant past history, doing exam, reviewing and discussing lab and imaging data, and formulating plans.  An After Visit Summary was printed and given to the patient.  Return in about 2 weeks (around 02/09/2020) for f/u HTN/weakness.  Signed:  02/11/2020, MD           01/26/2020

## 2020-02-03 ENCOUNTER — Ambulatory Visit: Payer: Medicare Other | Attending: Otolaryngology | Admitting: Otolaryngology

## 2020-02-03 ENCOUNTER — Encounter (HOSPITAL_BASED_OUTPATIENT_CLINIC_OR_DEPARTMENT_OTHER): Payer: Medicare Other

## 2020-02-03 VITALS — BP 142/82 | HR 62 | Temp 97.7°F

## 2020-02-03 DIAGNOSIS — J384 Edema of larynx: Secondary | ICD-10-CM

## 2020-02-03 DIAGNOSIS — R490 Dysphonia: Secondary | ICD-10-CM

## 2020-02-03 NOTE — Progress Notes (Signed)
[facility] - PROGRESS NOTE    February 03, 2020    CHIEF COMPLAINT   Dysphonia    INTERVAL HISTORY  Mr. 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     As in previous encounters at our clinic, Mr. Beaulieu remains in good spirits pleasant and optimistic.  Has been a year since his last visit in the intervening time he reports that he has had bilateral hip replacements though no other changes in medical status.  He adds that with respect to voice swallowing and breathing there have been no significant change in his condition.    States that he continues to tolerate solids and pills very well, as well as most liquids.  Over the past year he reports a couple incidents of choking on thinner liquids like water though this is not a regular event and of no concern to him.    States that he does have intermittent postnasal drip though does not cause any coughing spells.    He denies any hospitalizations for pneumonia of the past year.    His only hospitalization for or for bilateral hip replacement.  He reports that both of these procedures were carried out under general anesthesia, and the intubation extubation were uneventful.  He anticipates having to have both knees replaced in the coming years, however he is putting this off as long as possible.      REVIEW OF SYSTEMS     General, pulmonary, ENT, and GI systems were reviewed and were negative except as noted in the HPI and here. Patient denies, fevers, chills, weight loss SOB, hemoptysis, abdominal pain, nausea, vomiting.    PHYSICAL EXAMINATION     VITAL SIGNS: BP (!) 142/82   Pulse 62   Temp 36.5 C (Temporal)   SpO2 95%   GENERAL:  The patient is sitting comfortably in the examination chair, in no apparent pain or distress.  HEAD: normocephalic, no lesions  FACE: symmetric  EARS: normal external auditory canals and  tympanic membranes  NOSE: no significant masses, drainage, or major septal deviation  MOUTH: oral cavity and oropharynx without masses or lesions  NECK:  Soft and flat, with no palpable masses or lymphadenopathy.  Larynx and trachea are midline and mobile and elevate appropriately with swallowing.  Thyroid is not palpable.  RESPIRATORY:  No wheezing sounds or stridor.  Moves air well throughout, breathes easily through nose and mouth.  NEUROLOGIC:  Alert and oriented.  Extraocular muscles intact.  Facial sensation intact bilaterally. Facial motion symmetric. Swallow and tongue protrusion is symmetric.    PROCEDURE :    Flex Videostroboscopy   INDICATION:   Hoarseness Micah Flesher   OPERATORS:      Dr. Rolm Bookbinder and  Dr. Freddi Starr  SLP: None present    FINDINGS:     Nasopharynx clear   Palate elevation and closure:  Complete  Base of tongue, vallecula, epiglottis, with mild mucous pooling in right piriform sinus.  Subglottis: clear  Compression/ hyperfunction: Mild hypercompression.   Motion: Bilaterally mobile  On Stroboscopy:   Mucosa: Modest thinning bilaterally   Mucosal Wave: Normal amplitude, though asymmetric phase   Glottic Closure: Incomplete   DESCRIPTION OF PROCEDURE:   The procedure was explained to the patient who verbally agreed to proceed.  Viscous lidocaine was used as topical mucosal anesthetic. The flexible endoscope  was passed through the right nostril with the findings as described above. The scope was gently withdrawn. The patient tolerated the procedure well without complications.  The video was reviewed with the patient and their questions were answered.         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  Today he presented for 1 year checkup for continued management of his dysphonia.  As is his recent history he continues to be stable with respect to voice, breathing, swallowing.   Stroboscopy exam was significant for modest hyper compression, and stroboscopy was significant for vocal fold thinning though no evidence of any new lesions or masses.      Our plan is to continue with yearly surveillance, patient was scheduled for continuing management visit in 1 year.    Miles Costain, M.D. Ph.D.  Anesthesthiology Intern (R1)   Pager: [phone]      In the meanwhile, thank you again for allowing me to participate in this patient's care. If you have any questions regarding this case, or any others that you would like to discuss, please do not hesitate to contact me here at [facility], [phone]          [place], Ranae Pila, saw and evaluated this patient the day of their clinic visit. I have read and reviewed Dr. Bennye Alm note and agree.  I was present for the entire endoscopic procedure.    I have personally reviewed the history with the patient as well as going over the working diagnoses and management options.  I have answered the patient's questions to the best of my ability.

## 2020-02-11 ENCOUNTER — Other Ambulatory Visit: Payer: Self-pay | Admitting: Neurosurgery

## 2020-02-11 DIAGNOSIS — M62541 Muscle wasting and atrophy, not elsewhere classified, right hand: Secondary | ICD-10-CM

## 2020-02-16 ENCOUNTER — Other Ambulatory Visit: Payer: Self-pay

## 2020-02-16 ENCOUNTER — Ambulatory Visit (INDEPENDENT_AMBULATORY_CARE_PROVIDER_SITE_OTHER): Payer: Medicare Other | Admitting: Family Medicine

## 2020-02-16 ENCOUNTER — Encounter: Payer: Self-pay | Admitting: Family Medicine

## 2020-02-16 VITALS — BP 150/79 | HR 100 | Temp 98.3°F | Resp 16 | Ht 70.0 in | Wt 213.0 lb

## 2020-02-16 DIAGNOSIS — I10 Essential (primary) hypertension: Secondary | ICD-10-CM | POA: Diagnosis not present

## 2020-02-16 MED ORDER — AMLODIPINE BESYLATE 10 MG PO TABS: 10.0000 mg | ORAL_TABLET | Freq: Every day | ORAL | 3 refills | Status: DC

## 2020-02-16 NOTE — Progress Notes (Signed)
OFFICE VISIT  02/16/2020   CC:  Chief Complaint  Patient presents with  . Follow-up    HTN, weakness   HPI:    Patient is a 74 y.o. male who presents for 3 wk f/u HTN. A/P as of last visit: "1) HTN; untreated. Start amlodipine 10mg  qd.  2) Left arm/hand weakness: hx of chiari I with syringomyelia. Remote hx of surgery for this problem. Slowly progressive L hand weakness, chronic L arm pain.  Not sure if this has to do with his original problems or not but will ask neurosurgery to see him.  3) Osteoarthritis: hands with a bit of deformity, with significant stiffness and only minimal pain. Shoulders with signif dec ROM.  Hx of RC probs and surgery for same. 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 No w/u or treatment at this time but will address this problem further in near future when we recheck him for his bp."  INTERIM HX: No new complaints. Home bp check: 1 check after getting on med 120/83.  Subsequent checks have shown "error" message.  Occ episode of feeling mild dizziness upon standing.  NO presyncope, no falls. He saw neurosurg, Dr. 09-20-1997, has MR C spine scheduled for September.   Past Medical History:  Diagnosis Date  . Asthma   . 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 October, 1992).  . Hay fever   . Hypertension   . Osteoarthritis of both hands     Past Surgical History:  Procedure Laterality Date  . INCISION AND DRAINAGE Left 2012   L hand infection  . ROTATOR CUFF REPAIR Right    x 3  . SPINE SURGERY      Outpatient Medications Prior to Visit  Medication Sig Dispense Refill  . amLODipine (NORVASC) 10 MG tablet Take 1 tablet (10 mg total) by mouth daily. 30 tablet 0  . diazepam (VALIUM) 5 MG tablet Take 5 mg by mouth 2 (two) times daily as needed. (Patient not taking: Reported on 02/16/2020)     No facility-administered  medications prior to visit.    Allergies  Allergen Reactions  . Iodine Swelling  . Penicillins    ROS As per HPI  PE: Vitals with BMI 02/16/2020 01/26/2020 01/09/2020  Height 5\' 10"  5\' 10"  -  Weight 213 lbs 222 lbs 10 oz -  BMI 30.56 31.94 -  Systolic 150 159 01/11/2020  Diastolic 79 90 90  Pulse 100 92 77  O2 sat on RA today is 97% Repeat manual bp 120/82.  Gen: Alert, well appearing.  Patient is oriented to person, place, time, and situation. AFFECT: pleasant, lucid thought and speech. CV: Regular rhythm, rate about 90 by me, no m/r/g.   LUNGS: CTA bilat, nonlabored resps, good aeration in all lung fields. EXT: no clubbing or cyanosis.  no edema.    LABS:    Chemistry      Component Value Date/Time   NA 140 01/09/2020 1317   K 3.8 01/09/2020 1317   CL 106 01/09/2020 1317   CO2 25 01/09/2020 1317   BUN 12 01/09/2020 1317   CREATININE 1.10 01/09/2020 1317      Component Value Date/Time   CALCIUM 8.6 (L) 01/09/2020 1317      IMPRESSION AND PLAN:  1) Uncontrolled HTN: question of. BPs good here on his repeat check but initial check with elev systolic. His bp machine  readings are higher than ours but his cuff is signif smaller. Encouraged him to get larger cuff and resume home monitoring and we'll recheck/review them in 1 mo.  2)  Left arm/hand weakness: hx of chiari I with syringomyelia. He has seen neurosurgeon recently , is awaiting MRI to be done mid next mo. He wants to hold off on any imaging of his shoulders (chronic bilat shoulder pain, frozen shoulders, hx of shoulder surgery) until after c spine MRI/neurosurg eval. Consider PT in near future for this prob.  An After Visit Summary was printed and given to the patient.  FOLLOW UP: Return in about 4 weeks (around 03/15/2020) for f/u HTN.  Signed:  Santiago Bumpers, MD           02/16/2020

## 2020-03-06 ENCOUNTER — Other Ambulatory Visit: Payer: Self-pay | Admitting: Neurosurgery

## 2020-03-08 ENCOUNTER — Ambulatory Visit
Admission: RE | Admit: 2020-03-08 | Discharge: 2020-03-08 | Disposition: A | Discharge status: Home / Self Care | Payer: Medicare Other | Source: Ambulatory Visit | Attending: Neurosurgery | Admitting: Neurosurgery

## 2020-03-08 DIAGNOSIS — M62541 Muscle wasting and atrophy, not elsewhere classified, right hand: Secondary | ICD-10-CM

## 2020-03-08 IMAGING — MR MR CERVICAL SPINE W/O CM
5 series · 30 of 48 positions shown · non-contrast
Comparison: None.

CLINICAL DATA: Atrophy of right hand muscles. Right arm and hand
pain and weakness.

EXAM:
MRI CERVICAL SPINE WITHOUT CONTRAST
TECHNIQUE: Multiplanar, multisequence MR imaging of the cervical spine was
performed. No intravenous contrast was administered.

[Series 5: T2 · sagittal · 3.0mm · 0.82mm/px · 6 of 15 slices shown (1 of 2)]
[im 1/15]
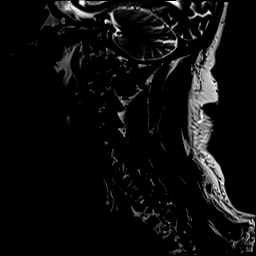
[im 3/15]
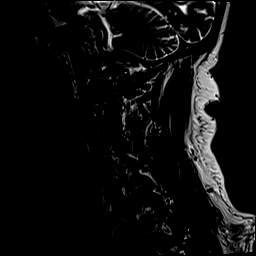
[im 6/15]
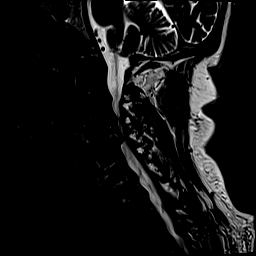
[im 9/15]
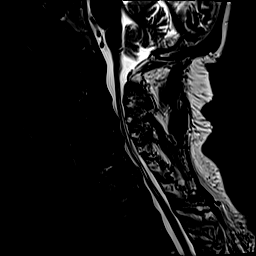
[im 12/15]
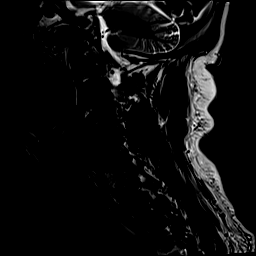
[im 15/15]
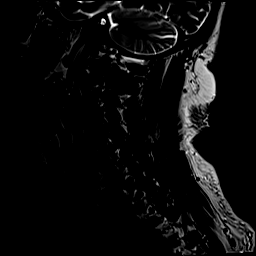

[Series 6: T1 · sagittal · 3.0mm · 0.82mm/px · 7 of 15 slices shown]
[im 1/15]
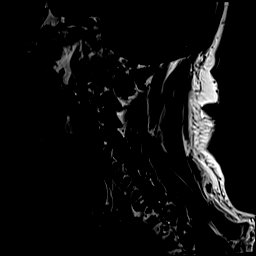
[im 3/15]
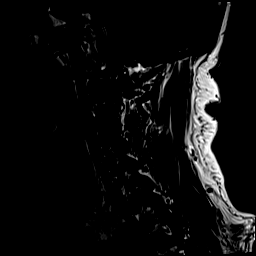
[im 5/15]
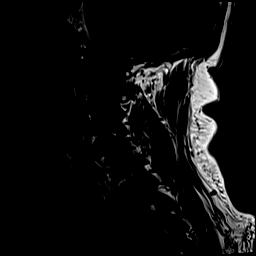
[im 8/15]
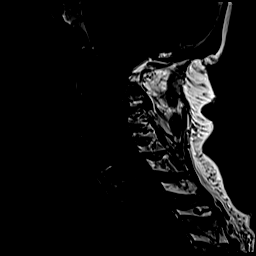
[im 10/15]
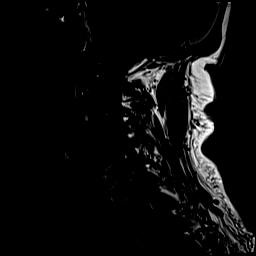
[im 12/15]
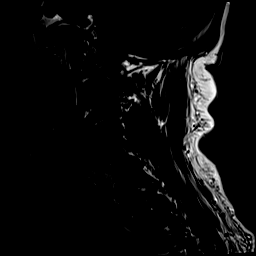
[im 15/15]
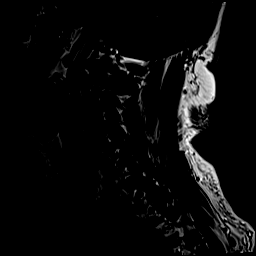

[Series 7: STIR · sagittal · 3.0mm · 0.33mm/px · 7 of 15 slices shown]
[im 1/15]
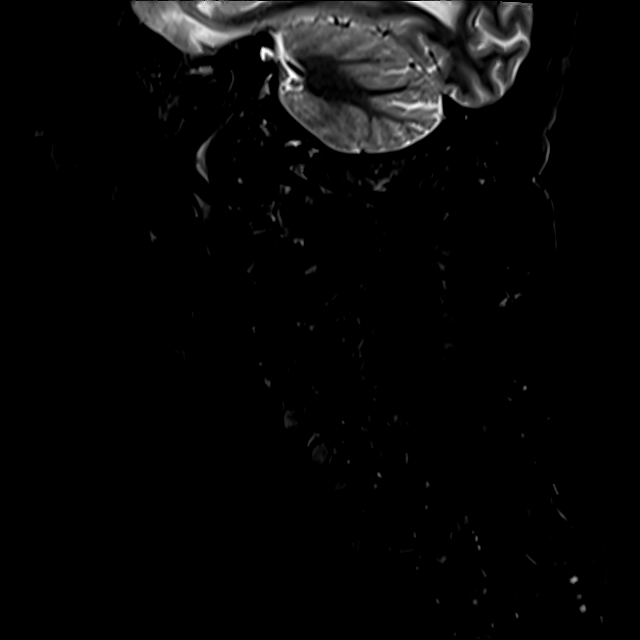
[im 3/15]
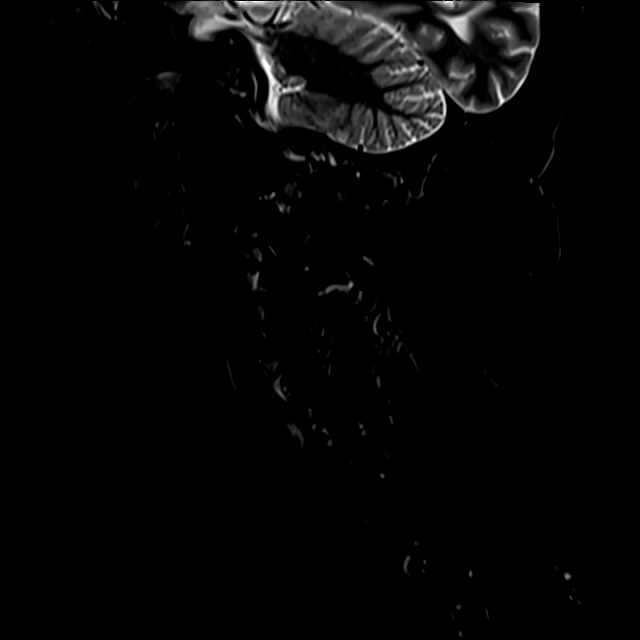
[im 5/15]
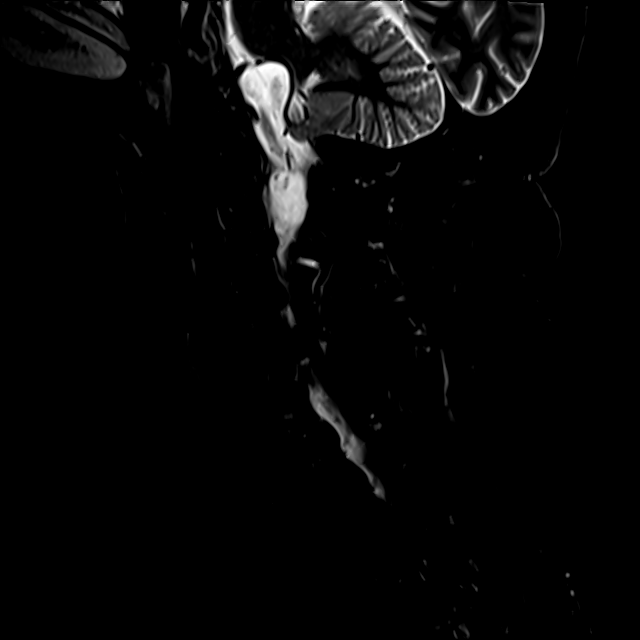
[im 8/15]
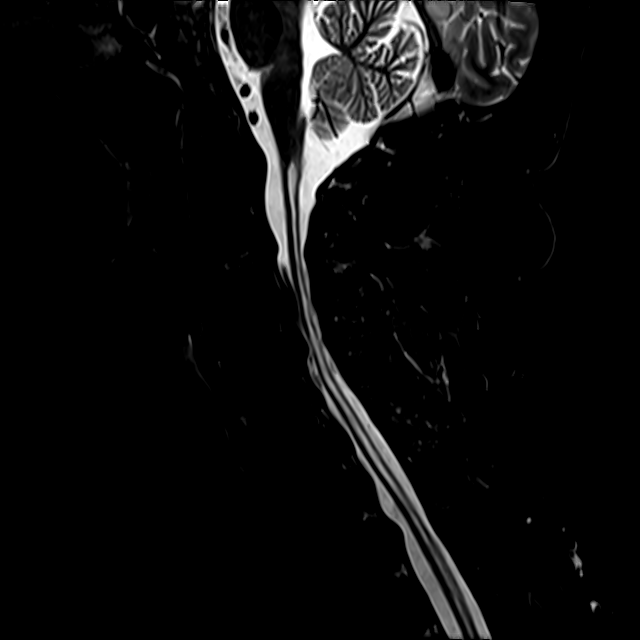
[im 10/15]
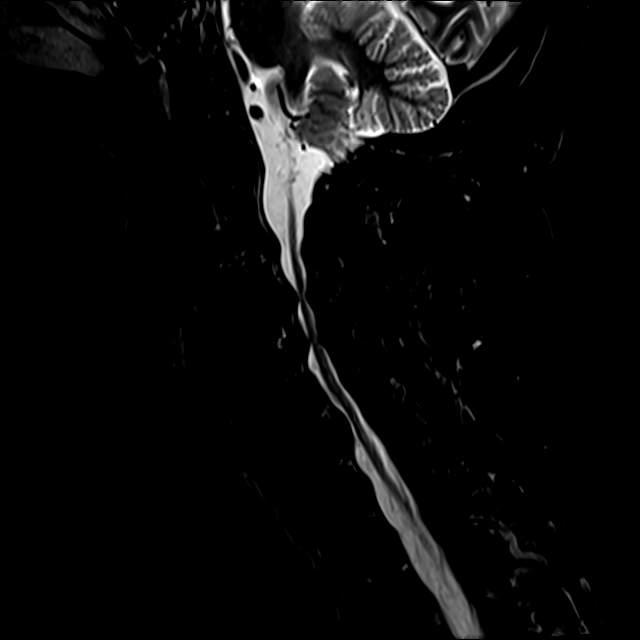
[im 12/15]
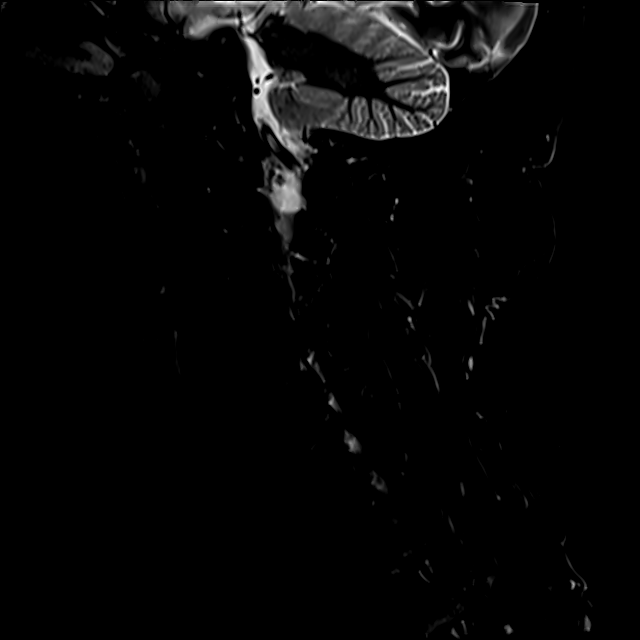
[im 15/15]
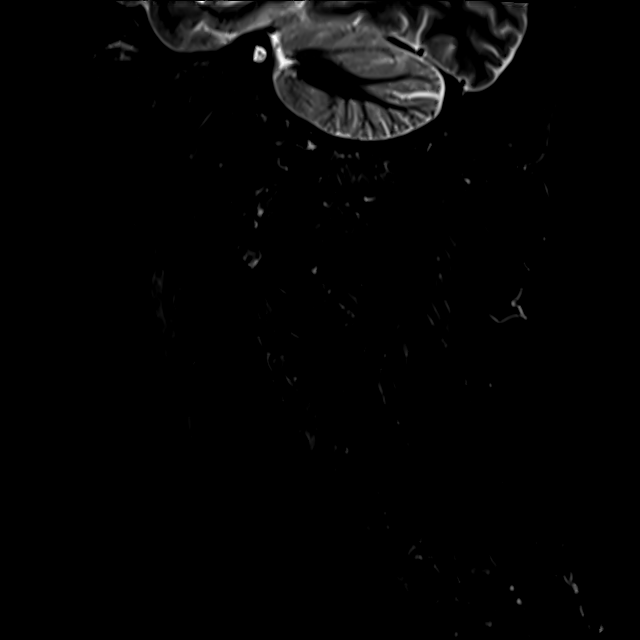

[Series 8: T2 · axial · 3.0mm · 0.50mm/px · z∈[-44,+49]mm · 8 of 30 slices shown (2 of 2)]
[im 1/30]
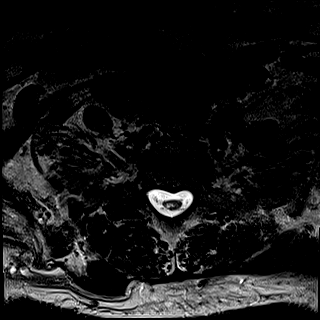
[im 5/30]
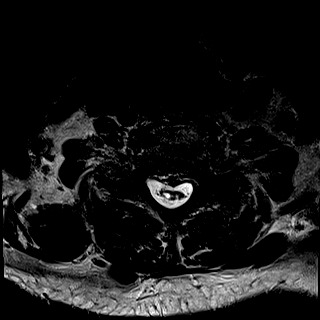
[im 9/30]
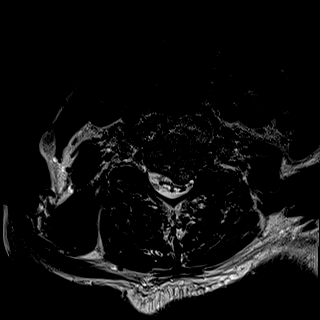
[im 14/30]
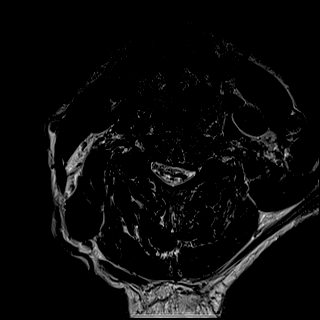
[im 16/30]
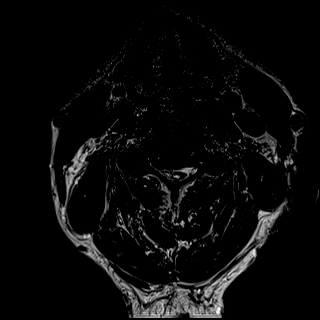
[im 21/30]
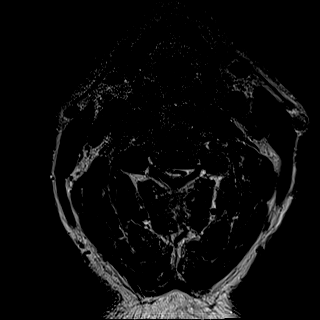
[im 25/30]
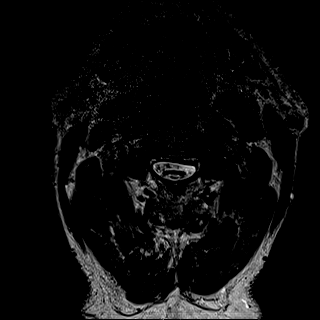
[im 30/30]
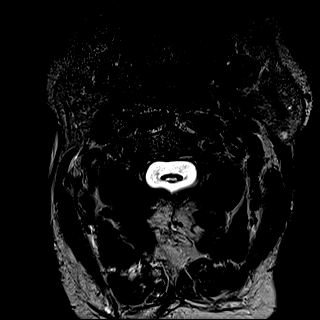

[Series 9: GRE · axial · 3.0mm · 0.42mm/px · z∈[-44,-31]mm · 2 of 30 slices shown]
[im 1/30]
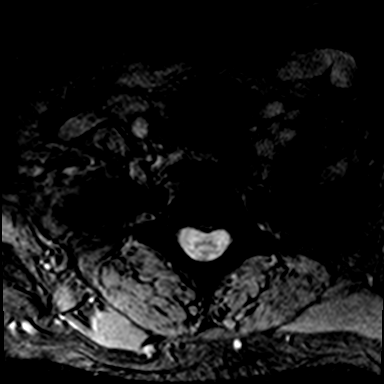
[im 5/30]
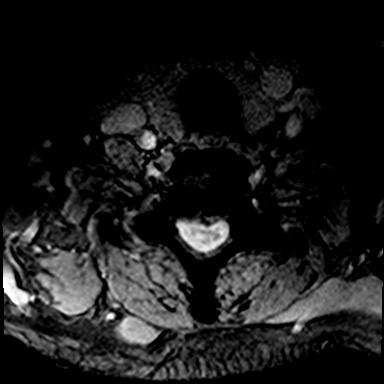

[30 of 48 positions shown; findings below may reference images not displayed]

FINDINGS: Alignment: Mild retrolisthesis C2-3.  Otherwise normal alignment

Vertebrae: Negative for fracture or mass. No evidence of spinal
infection.

Cord: Severe atrophy of the cervical spinal cord with extensive cord
hyperintensity bilaterally beginning at C2 and extending to T1.
Multilevel severe spinal stenosis. No mass lesion in the cord.

Posterior Fossa, vertebral arteries, paraspinal tissues: Negative

Disc levels:

C2-3: Disc degeneration and spondylosis. Moderate foraminal
narrowing bilaterally. Spinal canal adequate in size.

C3-4: Advanced disc degeneration and spondylosis with prominent
diffuse uncinate spurring. Severe spinal stenosis with cord
compression. Severe foraminal encroachment bilaterally.

C4-5: Advanced disc degeneration and spondylosis. Extensive diffuse
uncinate spurring. Severe spinal stenosis with cord compression.
Severe foraminal encroachment bilaterally

C5-6: Disc degeneration and spondylosis left greater than right.
Severe left foraminal encroachment due to spurring. Mild spinal
stenosis. Mild right foraminal stenosis.

C6-7: Disc degeneration with prominent uncinate spurring left
greater than right. Moderate left foraminal encroachment. Cord
flattening due to osteophyte with mild spinal stenosis. Mild right
foraminal narrowing.

C7-T1: Disc degeneration and spondylosis. Mild facet degeneration.
Mild foraminal narrowing bilaterally. Spinal canal adequate in size

T1-2: Central disc protrusion. Sagittal images only through this
level.
IMPRESSION: Extensive multilevel severe cord atrophy and cord hyperintensity due
to chronic compressive cord myelopathy.

Advanced multilevel spondylosis. Severe spinal stenosis at C3-4 and
C4-5. Multilevel severe foraminal encroachment.

## 2020-03-13 ENCOUNTER — Encounter: Payer: Self-pay | Admitting: Family Medicine

## 2020-03-14 ENCOUNTER — Other Ambulatory Visit: Payer: Self-pay

## 2020-03-14 ENCOUNTER — Ambulatory Visit (INDEPENDENT_AMBULATORY_CARE_PROVIDER_SITE_OTHER): Payer: Medicare Other | Admitting: Family Medicine

## 2020-03-14 ENCOUNTER — Encounter: Payer: Self-pay | Admitting: Family Medicine

## 2020-03-14 VITALS — Temp 98.5°F | Resp 17 | Ht 70.0 in | Wt 207.0 lb

## 2020-03-14 DIAGNOSIS — I10 Essential (primary) hypertension: Secondary | ICD-10-CM | POA: Diagnosis not present

## 2020-03-14 DIAGNOSIS — R42 Dizziness and giddiness: Secondary | ICD-10-CM

## 2020-03-14 DIAGNOSIS — R000 Tachycardia, unspecified: Secondary | ICD-10-CM

## 2020-03-14 NOTE — Patient Instructions (Signed)
Your goal fluid intake should be 65 ounces every day. Gradually work your way up to this amount over the next 1 month.

## 2020-03-14 NOTE — Progress Notes (Signed)
OFFICE VISIT  03/14/2020  CC:  Chief Complaint  Patient presents with  . Hypertension    waking up feeling dizzy some days, worse when getting up from lying down  . Constipation    LBM approx 5 days ago    HPI:    Patient is a 74 y.o. Caucasian male who presents for 4 week f/u HTN. A/P as of last visit: "1) Uncontrolled HTN: question of. BPs good here on his repeat check but initial check with elev systolic. His bp machine readings are higher than ours but his cuff is signif smaller. Encouraged him to get larger cuff and resume home monitoring and we'll recheck/review them in 1 mo.  2)  Left arm/hand weakness: hx of chiari I with syringomyelia. He has seen neurosurgeon recently , is awaiting MRI to be done mid next mo. He wants to hold off on any imaging of his shoulders (chronic bilat shoulder pain, frozen shoulders, hx of shoulder surgery) until after c spine MRI/neurosurg eval. Consider PT in near future for this prob."  INTERIM HX: NO home bp readings b/c actually not possible due to dexterity/weakness issues.  Intermittently gets some orthostatic dizziness: after wakes up from a nap or gets up in morning and starts to get moving he has brief feeling of this.  No falls or presyncope.  After this initial feeling resolves he has not further episodes throughout his day.    PO intake not that good, has infrequent BMs.   Drinks approx 16 oz of fluids TOTAL in avg day.  He saw Dr. Lovell Sheehan in neurosurg->see info in [facility] section below.  Past Medical History:  Diagnosis Date  . Asthma   . 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 Newell [place]al, 1992-->surg at [facility].  Summer 2021->Cervicalgia,arm pain, hand atrophy RUE wkness, hyperreflex (Dr. 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  . Hay fever   . Hypertension   . Osteoarthritis of both hands     Past Surgical  History:  Procedure Laterality Date  . INCISION AND DRAINAGE Left 2012   L hand infection  . ROTATOR CUFF REPAIR Right    x 3  . SPINE SURGERY      Outpatient Medications Prior to Visit  Medication Sig Dispense Refill  . amLODipine (NORVASC) 10 MG tablet Take 1 tablet (10 mg total) by mouth daily. 90 tablet 3  . diazepam (VALIUM) 5 MG tablet Take 5 mg by mouth 2 (two) times daily as needed. (Patient not taking: Reported on 02/16/2020)     No facility-administered medications prior to visit.    Allergies  Allergen Reactions  . Iodine Swelling  . Penicillins     ROS As per HPI  PE: Vitals with BMI 03/14/2020 02/16/2020 01/26/2020  Height 5\' 10"  5\' 10"  5\' 10"   Weight 207 lbs 213 lbs 222 lbs 10 oz  BMI 29.7 30.56 31.94  Systolic - 150 159  Diastolic - 79 90  Pulse - 100 92     Gen: Alert, well appearing.  Patient is oriented to person, place, time, and situation. AFFECT: pleasant, lucid thought and speech. CV: Reg, tachy to about 105, no m/r/g.   LUNGS: CTA bilat, nonlabored resps, good aeration in all lung fields.   LABS:  No results found for: TSH Lab Results  Component Value Date   WBC 4.8 01/09/2020   HGB 14.3 01/09/2020   HCT 45.5 01/09/2020  MCV 86.2 01/09/2020   PLT 152 01/09/2020   Lab Results  Component Value Date   CREATININE 1.10 01/09/2020   BUN 12 01/09/2020   NA 140 01/09/2020   K 3.8 01/09/2020   CL 106 01/09/2020   CO2 25 01/09/2020    IMPRESSION AND PLAN:  1) HTN: good control here. He doesn't have the arms/hands strength and dexterity to check bp with cuff at home. No med changes or labs today.  2) Orthostatic dizziness: this has been brought out a bit more since getting bp better controlled. He does not drink NEARLY enough fluids. Instructions: Your goal fluid intake should be 65 ounces every day. Gradually work your way up to this amount over the next 1 month.  An After Visit Summary was printed and given to the patient.  FOLLOW  UP: Return in about 1 month (around 04/13/2020) for fasting f/u HTN and dizziness.  Signed:  Santiago Bumpers, MD           03/14/2020

## 2020-03-24 ENCOUNTER — Other Ambulatory Visit: Payer: Self-pay | Admitting: Neurosurgery

## 2020-03-28 ENCOUNTER — Other Ambulatory Visit: Payer: Self-pay | Admitting: Neurosurgery

## 2020-04-03 NOTE — Progress Notes (Signed)
[id number] - [place], [place] - [address] [place] [address] Phone: [phone] Fax: [phone]      Your procedure is scheduled on 04/05/20.  Report to [facility] Main Entrance "A" at 1:00 P.M., and check in at the Admitting office.  Call this number if you have problems the morning of surgery:  [phone]  Call [phone] if you have any questions prior to your surgery date Monday-Friday 8am-4pm    Remember:  Do not eat or drink after midnight the night before your surgery    Take these medicines the morning of surgery with A SIP OF WATER: amLODipine (NORVASC)   As of today, STOP taking any Aspirin (unless otherwise instructed by your surgeon) Aleve, Naproxen, Ibuprofen, Motrin, Advil, Goody's, BC's, all herbal medications, fish oil, and all vitamins.                      Do not wear jewelry            Do not wear lotions, powders, colognes, or deodorant.            Do not shave 48 hours prior to surgery.  Men may shave face and neck.            Do not bring valuables to the hospital.            [facility] is not responsible for any belongings or valuables.  Do NOT Smoke (Tobacco/Vaping) or drink Alcohol 24 hours prior to your procedure If you use a CPAP at night, you may bring all equipment for your overnight stay.   Contacts, glasses, dentures or bridgework may not be worn into surgery.      For patients admitted to the hospital, discharge time will be determined by your treatment team.   Patients discharged the day of surgery will not be allowed to drive home, and someone needs to stay with them for 24 hours.    Special instructions:   [place]- Preparing For Surgery  Before surgery, you can play an important role. Because skin is not sterile, your skin needs to be as free of germs as possible. You can reduce the number of germs on your skin by washing with CHG (chlorahexidine gluconate) Soap before  surgery.  CHG is an antiseptic cleaner which kills germs and bonds with the skin to continue killing germs even after washing.    Oral Hygiene is also important to reduce your risk of infection.  Remember - BRUSH YOUR TEETH THE MORNING OF SURGERY WITH YOUR REGULAR TOOTHPASTE  Please do not use if you have an allergy to CHG or antibacterial soaps. If your skin becomes reddened/irritated stop using the CHG.  Do not shave (including legs and underarms) for at least 48 hours prior to first CHG shower. It is OK to shave your face.  Please follow these instructions carefully.   1. Shower the NIGHT BEFORE SURGERY and the MORNING OF SURGERY with CHG Soap.   2. If you chose to wash your hair, wash your hair first as usual with your normal shampoo.  3. After you shampoo, rinse your hair and body thoroughly to remove the shampoo.  4. Use CHG as you would any other liquid soap. You can apply CHG directly to the skin and wash gently with a scrungie or a clean washcloth.   5. Apply the CHG Soap to your body ONLY FROM THE NECK  DOWN.  Do not use on open wounds or open sores. Avoid contact with your eyes, ears, mouth and genitals (private parts). Wash Face and genitals (private parts)  with your normal soap.   6. Wash thoroughly, paying special attention to the area where your surgery will be performed.  7. Thoroughly rinse your body with warm water from the neck down.  8. DO NOT shower/wash with your normal soap after using and rinsing off the CHG Soap.  9. Pat yourself dry with a CLEAN TOWEL.  10. Wear CLEAN PAJAMAS to bed the night before surgery  11. Place CLEAN SHEETS on your bed the night of your first shower and DO NOT SLEEP WITH PETS.   Day of Surgery: Wear Clean/Comfortable clothing the morning of surgery Do not apply any deodorants/lotions.   Remember to brush your teeth WITH YOUR REGULAR TOOTHPASTE.   Please read over the following fact sheets that you were given.

## 2020-04-04 ENCOUNTER — Encounter (HOSPITAL_COMMUNITY): Payer: Self-pay

## 2020-04-04 ENCOUNTER — Other Ambulatory Visit (HOSPITAL_COMMUNITY)
Admission: RE | Admit: 2020-04-04 | Discharge: 2020-04-04 | Disposition: A | Discharge status: Home / Self Care | Payer: Medicare Other | Source: Ambulatory Visit | Attending: Neurosurgery | Admitting: Neurosurgery

## 2020-04-04 ENCOUNTER — Other Ambulatory Visit: Payer: Self-pay

## 2020-04-04 ENCOUNTER — Encounter (HOSPITAL_COMMUNITY)
Admission: RE | Admit: 2020-04-04 | Discharge: 2020-04-04 | Disposition: A | Discharge status: Home / Self Care | Payer: Medicare Other | Source: Ambulatory Visit | Attending: Neurosurgery | Admitting: Neurosurgery

## 2020-04-04 ENCOUNTER — Telehealth: Payer: Self-pay

## 2020-04-04 DIAGNOSIS — Z01812 Encounter for preprocedural laboratory examination: Secondary | ICD-10-CM | POA: Insufficient documentation

## 2020-04-04 DIAGNOSIS — Z20822 Contact with and (suspected) exposure to covid-19: Secondary | ICD-10-CM | POA: Insufficient documentation

## 2020-04-04 LAB — BASIC METABOLIC PANEL
Anion gap: 14 (ref 5–15)
BUN: 10 mg/dL (ref 8–23)
CO2: 20 mmol/L — ABNORMAL LOW (ref 22–32)
Calcium: 9.4 mg/dL (ref 8.9–10.3)
Chloride: 107 mmol/L (ref 98–111)
Creatinine, Ser: 1.13 mg/dL (ref 0.61–1.24)
GFR, Estimated: >60 mL/min (ref >60)
Glucose, Bld: 120 mg/dL — ABNORMAL HIGH (ref 70–99)
Potassium: 3.7 mmol/L (ref 3.5–5.1)
Sodium: 141 mmol/L (ref 135–145)

## 2020-04-04 LAB — CBC
HCT: 46.7 % (ref 39.0–52.0)
Hemoglobin: 14.9 g/dL (ref 13.0–17.0)
MCH: 28.3 pg (ref 26.0–34.0)
MCHC: 31.9 g/dL (ref 30.0–36.0)
MCV: 88.6 fL (ref 80.0–100.0)
Platelets: 175 10*3/uL (ref 150–400)
RBC: 5.27 MIL/uL (ref 4.22–5.81)
RDW: 14 % (ref 11.5–15.5)
WBC: 5.7 10*3/uL (ref 4.0–10.5)
nRBC: 0 % (ref 0.0–0.2)

## 2020-04-04 LAB — TYPE AND SCREEN
ABO/RH(D): A POS
Antibody Screen: NEGATIVE

## 2020-04-04 LAB — SURGICAL PCR SCREEN
MRSA, PCR: NEGATIVE
Staphylococcus aureus: POSITIVE — AB

## 2020-04-04 LAB — SARS CORONAVIRUS 2 (TAT 6-24 HRS): SARS Coronavirus 2: NEGATIVE

## 2020-04-04 MED ORDER — CHLORHEXIDINE GLUCONATE CLOTH 2 % EX PADS: 6.0000 | MEDICATED_PAD | Freq: Once | CUTANEOUS | Status: DC

## 2020-04-04 NOTE — Telephone Encounter (Signed)
FYI -   Patient called to say he is having cervical surgery tomorrow - Dr. Lovell Sheehan.  He confirmed appt with Dr. Milinda Cave on 04/12/20.   No returned call needed.

## 2020-04-04 NOTE — Telephone Encounter (Signed)
FYI  Please see below

## 2020-04-04 NOTE — Progress Notes (Signed)
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 PAT was on 04/04/20. He was seen by Dr. Lovell Sheehan on 02/11/20 with weakness, atrophy, hyperreflexia RUE > LUE. MRI showed extensive multi-level severe cord atrophy and cord hypersensitivity due to chronic compressive cord myelopathy and severe spinal stenosis at C3-4 and C4-5 levels.  History includes former smoker, asthma, HTN, right rotator cuff repair, Chari 1 malformation, spinal surgery. Regarding Chair 1 malformation, additional details include: "with assoc syringomyelia. 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   He was recently started on amlodipine in August for HTN (~ 160/90). BP 139/85. HR 107 with PAT vitals. HR 97 bpm with 01/09/20 EKG. He denied chest pain and SOB at PAT RN visit.   04/04/2020 presurgical COVID-19 test is in process.  Anesthesia team to further evaluate on the day of surgery.   VS: BP 139/85   Pulse (!) 107   Temp 36.8 C (Oral)   Resp 18   Ht 5\' 10"  (1.778 m)   Wt 90.9 kg   SpO2 98%   BMI 28.75 kg/m     PROVIDERS: , MD is PCP, established 01/26/20 and referred to neurosurgery following ED visit for left arm pain with left thumb and hand numbness, felt likely related to musculoskeletal etiology.    LABS: Labs reviewed: Acceptable for surgery. (all labs ordered are listed, but only abnormal results are displayed)  Labs Reviewed  SURGICAL PCR SCREEN - Abnormal; Notable for the following components:      Result Value   Staphylococcus aureus POSITIVE (*)     All other components within normal limits  BASIC METABOLIC PANEL - Abnormal; Notable for the following components:   CO2 20 (*)    Glucose, Bld 120 (*)    All other components within normal limits  CBC  TYPE AND SCREEN    IMAGES: MRI C-spine 03/08/20: IMPRESSION: - Extensive multilevel severe cord atrophy and cord hyperintensity due to chronic compressive cord myelopathy. - Advanced multilevel spondylosis. Severe spinal stenosis at C3-4 and C4-5. Multilevel severe foraminal encroachment.  CXR 01/09/20: FINDINGS: The heart size and mediastinal contours are within normal limits. There are mild bandlike opacities in the bilateral lung bases likely reflecting atelectasis. No pneumothorax or pleural effusion. No acute finding in the visualized skeleton. IMPRESSION: Mild bibasilar atelectasis.   EKG: 01/09/20: NSR, LAFB - Currently no comparison EKGs available--none in [facility] or in [place].    CV: N/A   Past Medical History:  Diagnosis Date  . Asthma   . 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 [place], 1992-->surg at [facility].  Summer 2021->Cervicalgia,arm pain, hand atrophy RUE wkness, hyperreflex (Dr. 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  . Hay fever   . Hypertension   . Osteoarthritis of both hands     Past Surgical History:  Procedure Laterality Date  . BACK SURGERY    . INCISION AND DRAINAGE Left 2012   L hand infection  . ROTATOR CUFF REPAIR  Right    x 3  . SPINE SURGERY      MEDICATIONS: . amLODipine (NORVASC) 10 MG tablet   . Chlorhexidine Gluconate Cloth 2 % PADS 6 each   And  . Chlorhexidine Gluconate Cloth 2 % PADS 6 each    Shonna Chock, PA-C Surgical Short Stay/Anesthesiology [facility] Phone [phone] [facility] Phone [phone] 04/04/2020 3:49 PM

## 2020-04-04 NOTE — Anesthesia Preprocedure Evaluation (Addendum)
Anesthesia Evaluation  Patient identified by MRN, date of birth, ID band Patient awake    Reviewed: Allergy & Precautions, NPO status , Patient's Chart, lab work & pertinent test results  Airway Mallampati: II  TM Distance: >3 FB     Dental  (+) Dental Advisory Given   Pulmonary asthma , former smoker,    breath sounds clear to auscultation       Cardiovascular hypertension, Pt. on medications  Rhythm:Regular Rate:Normal     Neuro/Psych Chiari malformation Cervical myelopathy    GI/Hepatic negative GI ROS, Neg liver ROS,   Endo/Other    Renal/GU negative Renal ROS     Musculoskeletal  (+) Arthritis ,   Abdominal   Peds  Hematology negative hematology ROS (+)   Anesthesia Other Findings   Reproductive/Obstetrics                             Anesthesia Physical Anesthesia Plan  ASA: III  Anesthesia Plan: General   Post-op Pain Management:    Induction: Intravenous  PONV Risk Score and Plan: 2 and Dexamethasone, Ondansetron and Treatment may vary due to age or medical condition  Airway Management Planned: Oral ETT and Video Laryngoscope Planned  Additional Equipment: None  Intra-op Plan:   Post-operative Plan: Extubation in OR  Informed Consent: I have reviewed the patients History and Physical, chart, labs and discussed the procedure including the risks, benefits and alternatives for the proposed anesthesia with the patient or authorized representative who has indicated his/her understanding and acceptance.     Dental advisory given  Plan Discussed with: CRNA  Anesthesia Plan Comments: (PAT note written 04/04/2020 by Shonna Chock, PA-C. )       Anesthesia Quick Evaluation

## 2020-04-04 NOTE — Progress Notes (Signed)
PCP - DR Orland Dec      AWARE OF SURGERY   -  Chest x-ray - 7/21 EKG -7/21    Sleep Study - NA CPAP -NA    Aspirin Instructions:TO STOP FOR COVID TEST TODAY  Anesthesia review:  ABN EKG Patient denies shortness of breath, fever, cough and chest pain at PAT appointment   All instructions explained to the patient, with a verbal understanding of the material. Patient agrees to go over the instructions while at home for a better understanding. Patient also instructed to self quarantine after being tested for COVID-19. The opportunity to ask questions was provided.

## 2020-04-05 ENCOUNTER — Inpatient Hospital Stay (HOSPITAL_COMMUNITY)
Admission: RE | Admit: 2020-04-05 | Discharge: 2020-04-07 | DRG: 471 | Disposition: A | Discharge status: Rehabilitation Facility | Payer: Medicare Other | Attending: Neurosurgery | Admitting: Neurosurgery

## 2020-04-05 ENCOUNTER — Ambulatory Visit (HOSPITAL_COMMUNITY): Payer: Medicare Other | Admitting: Vascular Surgery

## 2020-04-05 ENCOUNTER — Ambulatory Visit (HOSPITAL_COMMUNITY): Payer: Medicare Other

## 2020-04-05 ENCOUNTER — Ambulatory Visit (HOSPITAL_COMMUNITY)
Admission: RE | Disposition: A | Discharge status: Rehabilitation Facility | Payer: Self-pay | Source: Home / Self Care | Attending: Neurosurgery

## 2020-04-05 ENCOUNTER — Encounter (HOSPITAL_COMMUNITY): Payer: Self-pay | Admitting: Neurosurgery

## 2020-04-05 DIAGNOSIS — M4712 Other spondylosis with myelopathy, cervical region: Principal | ICD-10-CM | POA: Diagnosis present

## 2020-04-05 DIAGNOSIS — Z419 Encounter for procedure for purposes other than remedying health state, unspecified: Secondary | ICD-10-CM

## 2020-04-05 DIAGNOSIS — Z811 Family history of alcohol abuse and dependence: Secondary | ICD-10-CM

## 2020-04-05 DIAGNOSIS — M4722 Other spondylosis with radiculopathy, cervical region: Secondary | ICD-10-CM | POA: Diagnosis present

## 2020-04-05 DIAGNOSIS — G8918 Other acute postprocedural pain: Secondary | ICD-10-CM

## 2020-04-05 DIAGNOSIS — Z880 Allergy status to penicillin: Secondary | ICD-10-CM

## 2020-04-05 DIAGNOSIS — Z87891 Personal history of nicotine dependence: Secondary | ICD-10-CM

## 2020-04-05 DIAGNOSIS — M19041 Primary osteoarthritis, right hand: Secondary | ICD-10-CM | POA: Diagnosis present

## 2020-04-05 DIAGNOSIS — I10 Essential (primary) hypertension: Secondary | ICD-10-CM

## 2020-04-05 DIAGNOSIS — R490 Dysphonia: Secondary | ICD-10-CM | POA: Diagnosis present

## 2020-04-05 DIAGNOSIS — J45909 Unspecified asthma, uncomplicated: Secondary | ICD-10-CM | POA: Diagnosis present

## 2020-04-05 DIAGNOSIS — R2681 Unsteadiness on feet: Secondary | ICD-10-CM | POA: Diagnosis present

## 2020-04-05 DIAGNOSIS — Z91048 Other nonmedicinal substance allergy status: Secondary | ICD-10-CM

## 2020-04-05 DIAGNOSIS — Z833 Family history of diabetes mellitus: Secondary | ICD-10-CM

## 2020-04-05 DIAGNOSIS — M19042 Primary osteoarthritis, left hand: Secondary | ICD-10-CM | POA: Diagnosis present

## 2020-04-05 DIAGNOSIS — Z79899 Other long term (current) drug therapy: Secondary | ICD-10-CM

## 2020-04-05 DIAGNOSIS — R400 Somnolence: Secondary | ICD-10-CM | POA: Diagnosis present

## 2020-04-05 DIAGNOSIS — G935 Compression of brain: Secondary | ICD-10-CM | POA: Diagnosis present

## 2020-04-05 DIAGNOSIS — M792 Neuralgia and neuritis, unspecified: Secondary | ICD-10-CM

## 2020-04-05 DIAGNOSIS — Z20822 Contact with and (suspected) exposure to covid-19: Secondary | ICD-10-CM | POA: Diagnosis present

## 2020-04-05 DIAGNOSIS — Z8249 Family history of ischemic heart disease and other diseases of the circulatory system: Secondary | ICD-10-CM

## 2020-04-05 DIAGNOSIS — G8250 Quadriplegia, unspecified: Secondary | ICD-10-CM

## 2020-04-05 HISTORY — PX: ANTERIOR CERVICAL DECOMP/DISCECTOMY FUSION: SHX1161

## 2020-04-05 LAB — ABO/RH: ABO/RH(D): A POS

## 2020-04-05 IMAGING — CR DG CERVICAL SPINE 2 OR 3 VIEWS
3 series · 3 of 3 positions shown · non-contrast
Comparison: MRI [DATE]

CLINICAL DATA: Intraoperative localization images of the cervical
spine

EXAM:
CERVICAL SPINE - 2-3 VIEW

[AP (1 of 3)]
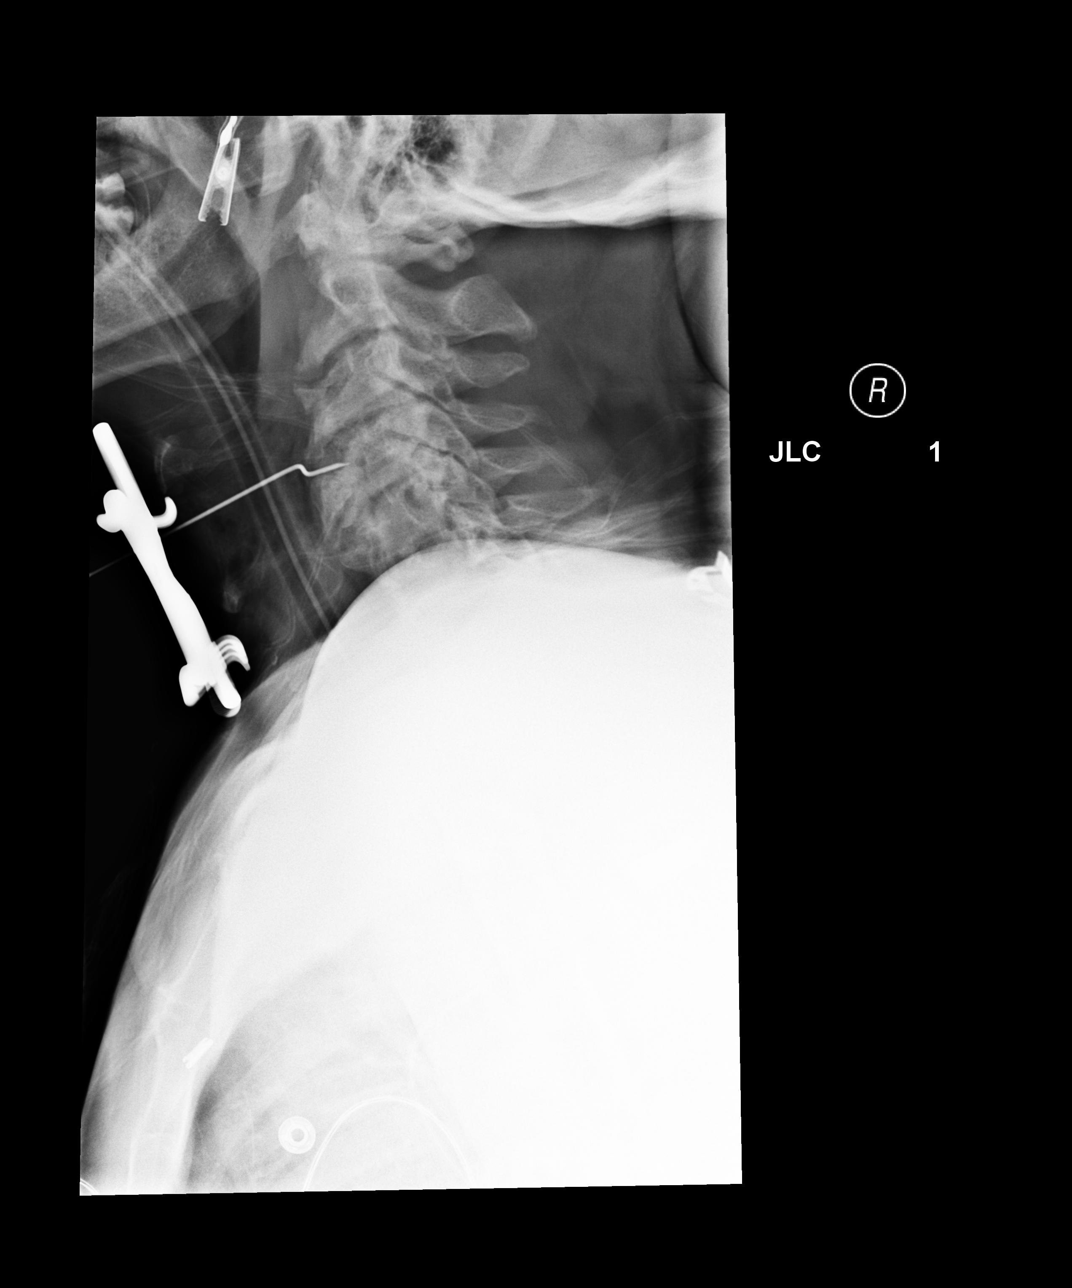

[AP (2 of 3)]
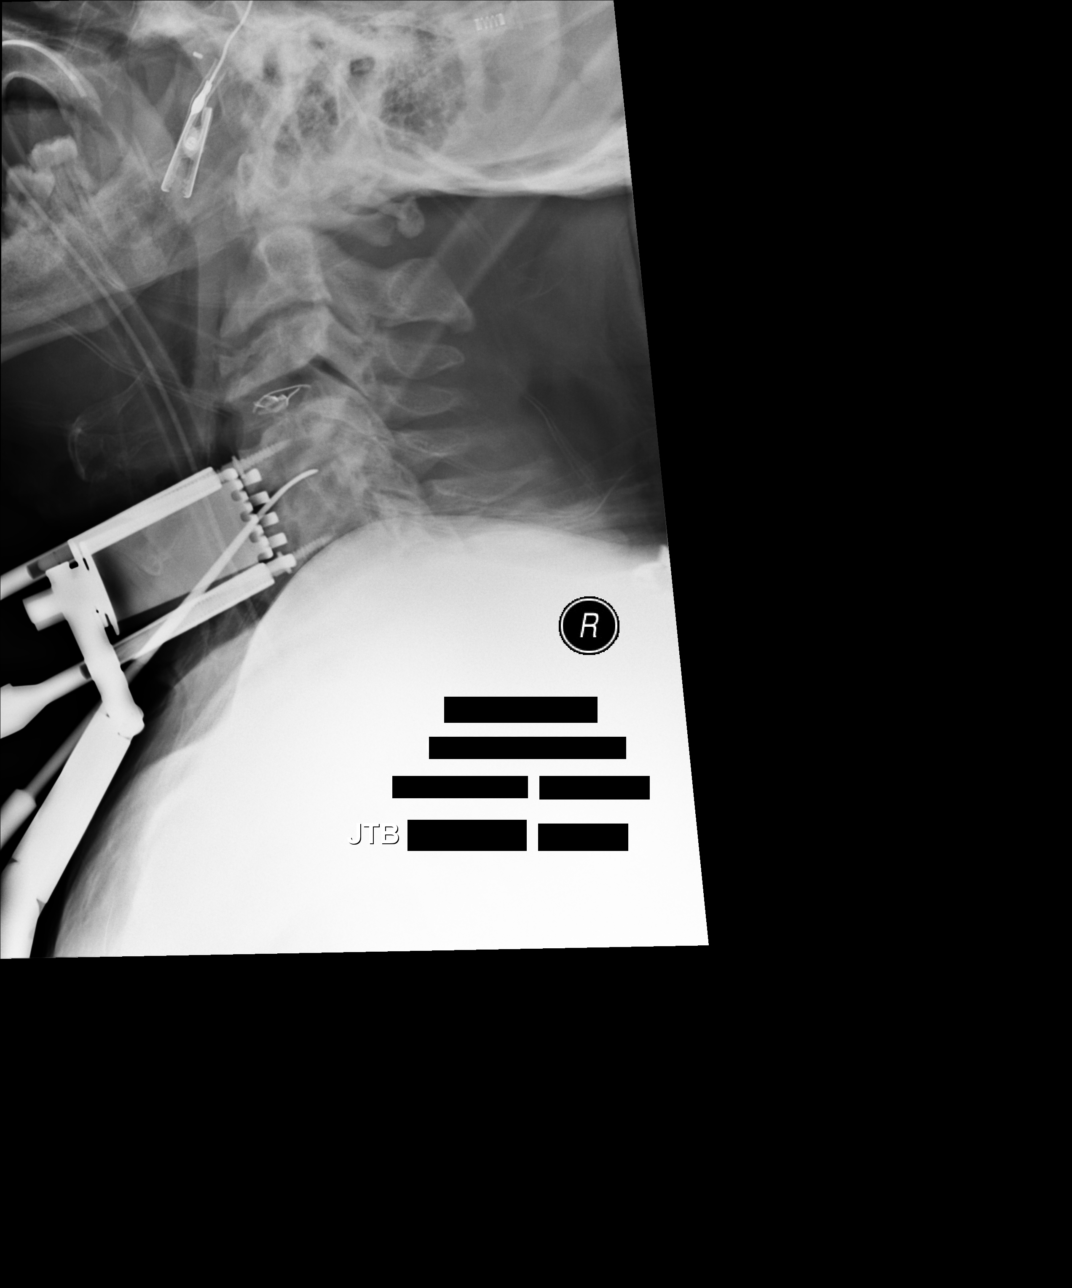

[AP (3 of 3)]
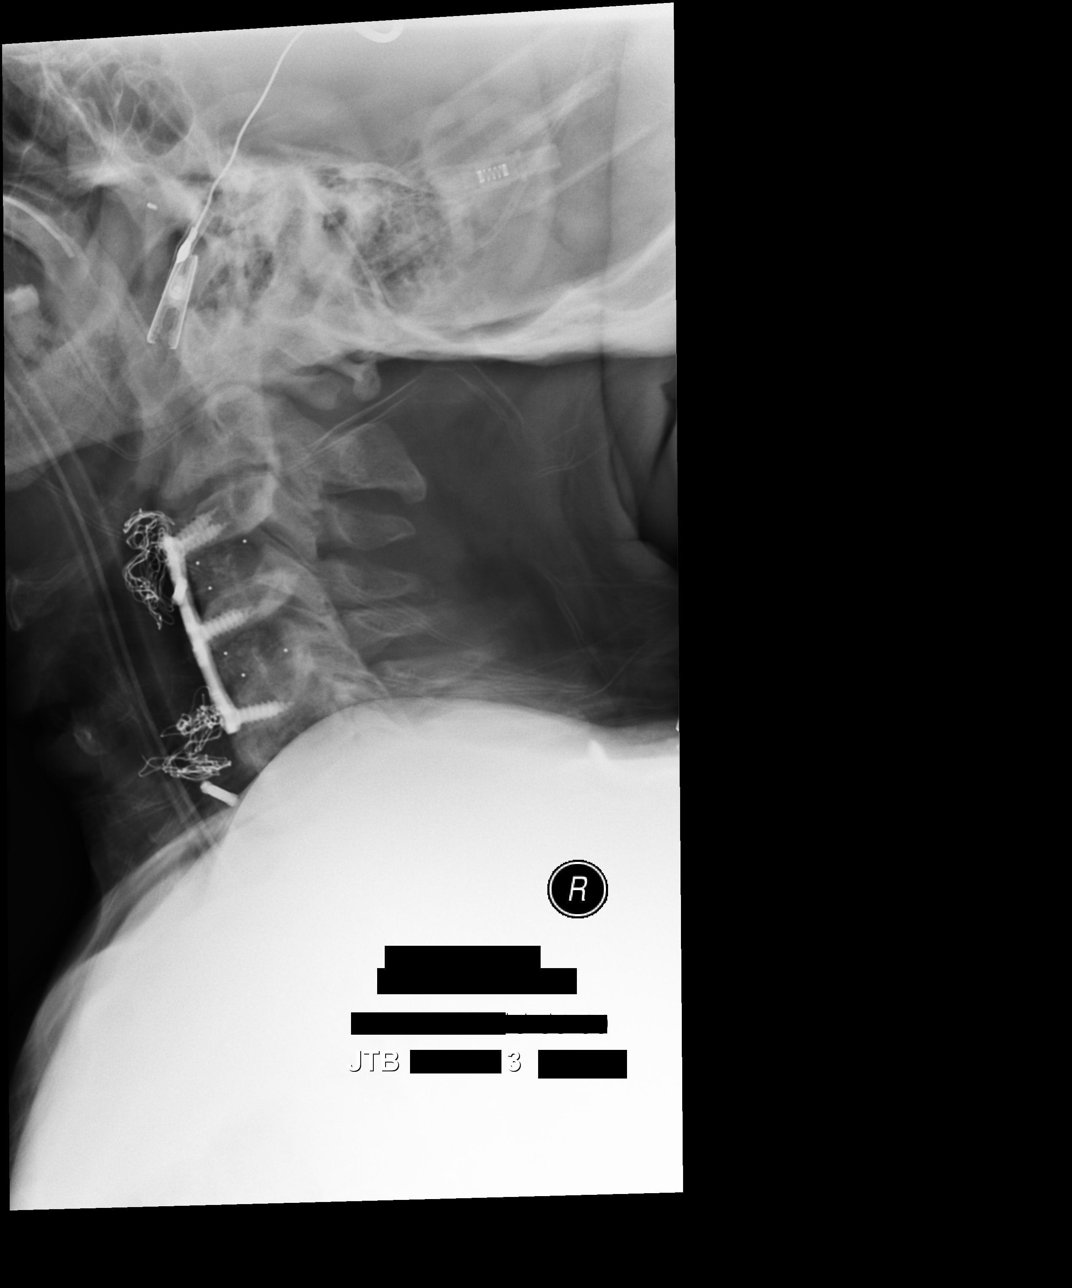

[3 of 3 positions shown; findings below may reference images not displayed]

FINDINGS: Three sequential cross-table lateral images of the cervical spine
were obtained.

[DATE] Radiograph: Demonstrates a pointed surgical implement at the
anterior disc space C3-4 with 3 tractor anteriorly. Intubation.
Electrode grounding lead projecting over the head.

[DATE] Radiograph: Pointed surgical implement positioned at the C4-5
disc space. Surgical retractor anterior to these levels with screws
positioned in the anterior cortices of the C4 and 5 vertebral
bodies. Surgical material seen within the intervertebral disc space
C3-4 as well.

[DATE] Radiograph: Interval placement of interbody spacers at C3-4,
C4-5 with anterior plate and screw fixation construct with bilateral
securing screws at the C3-C5 vertebral bodies anteriorly. Anterior
laparotomy sponge markers present. Intubated. Grounding lead
remains.
IMPRESSION: 1. Intraoperative localization and subsequent anterior cervical
discectomy and fusion at C3-5 as described above.
2. Final image depicts at least 2 small laparotomy sponge markers in
the anterior soft tissue.

## 2020-04-05 SURGERY — ANTERIOR CERVICAL DECOMPRESSION/DISCECTOMY FUSION 2 LEVELS
Anesthesia: General

## 2020-04-05 MED ORDER — FENTANYL CITRATE (PF) 250 MCG/5ML IJ SOLN
INTRAMUSCULAR | Status: AC
  Filled 2020-04-05: qty 5

## 2020-04-05 MED ORDER — DEXAMETHASONE SODIUM PHOSPHATE 10 MG/ML IJ SOLN
INTRAMUSCULAR | Status: DC | PRN
  Administered 2020-04-05: 10 mg via INTRAVENOUS

## 2020-04-05 MED ORDER — CYCLOBENZAPRINE HCL 10 MG PO TABS: 10.0000 mg | ORAL_TABLET | Freq: Three times a day (TID) | ORAL | Status: DC | PRN

## 2020-04-05 MED ORDER — MENTHOL 3 MG MT LOZG: 1.0000 | LOZENGE | OROMUCOSAL | Status: DC | PRN

## 2020-04-05 MED ORDER — DEXAMETHASONE SODIUM PHOSPHATE 4 MG/ML IJ SOLN: 4.0000 mg | Freq: Four times a day (QID) | INTRAMUSCULAR | Status: AC

## 2020-04-05 MED ORDER — THROMBIN 5000 UNITS EX SOLR
OROMUCOSAL | Status: DC | PRN
  Administered 2020-04-05: 5 mL via TOPICAL
  Administered 2020-04-05: 5 mL

## 2020-04-05 MED ORDER — DOCUSATE SODIUM 100 MG PO CAPS
100.0000 mg | ORAL_CAPSULE | Freq: Two times a day (BID) | ORAL | Status: DC
  Administered 2020-04-05 – 2020-04-07 (×4): 100 mg via ORAL
  Filled 2020-04-05 (×4): qty 1

## 2020-04-05 MED ORDER — ONDANSETRON HCL 4 MG PO TABS
4.0000 mg | ORAL_TABLET | Freq: Four times a day (QID) | ORAL | Status: DC | PRN
  Filled 2020-04-05: qty 1

## 2020-04-05 MED ORDER — ORAL CARE MOUTH RINSE: 15.0000 mL | Freq: Once | OROMUCOSAL | Status: AC

## 2020-04-05 MED ORDER — ALUM & MAG HYDROXIDE-SIMETH 200-200-20 MG/5ML PO SUSP: 30.0000 mL | Freq: Four times a day (QID) | ORAL | Status: DC | PRN

## 2020-04-05 MED ORDER — OXYCODONE HCL 5 MG PO TABS
5.0000 mg | ORAL_TABLET | ORAL | Status: DC | PRN
  Filled 2020-04-05: qty 1

## 2020-04-05 MED ORDER — SUGAMMADEX SODIUM 200 MG/2ML IV SOLN
INTRAVENOUS | Status: DC | PRN
  Administered 2020-04-05: 200 mg via INTRAVENOUS

## 2020-04-05 MED ORDER — BUPIVACAINE-EPINEPHRINE 0.5% -1:200000 IJ SOLN
INTRAMUSCULAR | Status: AC
  Filled 2020-04-05: qty 1

## 2020-04-05 MED ORDER — PANTOPRAZOLE SODIUM 40 MG PO TBEC
40.0000 mg | DELAYED_RELEASE_TABLET | Freq: Every day | ORAL | Status: DC
  Administered 2020-04-05 – 2020-04-06 (×2): 40 mg via ORAL
  Filled 2020-04-05 (×2): qty 1

## 2020-04-05 MED ORDER — THROMBIN 5000 UNITS EX SOLR
CUTANEOUS | Status: AC
  Filled 2020-04-05: qty 5000

## 2020-04-05 MED ORDER — ACETAMINOPHEN 325 MG PO TABS
650.0000 mg | ORAL_TABLET | ORAL | Status: DC | PRN
  Filled 2020-04-05: qty 2

## 2020-04-05 MED ORDER — BISACODYL 10 MG RE SUPP: 10.0000 mg | Freq: Every day | RECTAL | Status: DC | PRN

## 2020-04-05 MED ORDER — VANCOMYCIN HCL IN DEXTROSE 1-5 GM/200ML-% IV SOLN
1000.0000 mg | Freq: Two times a day (BID) | INTRAVENOUS | Status: DC
  Administered 2020-04-05 – 2020-04-06 (×3): 1000 mg via INTRAVENOUS
  Filled 2020-04-05 (×4): qty 200

## 2020-04-05 MED ORDER — EPHEDRINE 5 MG/ML INJ
INTRAVENOUS | Status: AC
  Filled 2020-04-05: qty 10

## 2020-04-05 MED ORDER — ROCURONIUM BROMIDE 10 MG/ML (PF) SYRINGE
PREFILLED_SYRINGE | INTRAVENOUS | Status: AC
  Filled 2020-04-05: qty 10

## 2020-04-05 MED ORDER — PHENYLEPHRINE 40 MCG/ML (10ML) SYRINGE FOR IV PUSH (FOR BLOOD PRESSURE SUPPORT)
PREFILLED_SYRINGE | INTRAVENOUS | Status: AC
  Filled 2020-04-05: qty 10

## 2020-04-05 MED ORDER — ACETAMINOPHEN 650 MG RE SUPP: 650.0000 mg | RECTAL | Status: DC | PRN

## 2020-04-05 MED ORDER — BACITRACIN ZINC 500 UNIT/GM EX OINT
TOPICAL_OINTMENT | CUTANEOUS | Status: DC | PRN
  Administered 2020-04-05: 1 via TOPICAL

## 2020-04-05 MED ORDER — ACETAMINOPHEN 500 MG PO TABS
1000.0000 mg | ORAL_TABLET | Freq: Once | ORAL | Status: AC
  Administered 2020-04-05: 1000 mg via ORAL
  Filled 2020-04-05: qty 2

## 2020-04-05 MED ORDER — LIDOCAINE 2% (20 MG/ML) 5 ML SYRINGE
INTRAMUSCULAR | Status: AC
  Filled 2020-04-05: qty 5

## 2020-04-05 MED ORDER — FENTANYL CITRATE (PF) 100 MCG/2ML IJ SOLN: 25.0000 ug | INTRAMUSCULAR | Status: DC | PRN

## 2020-04-05 MED ORDER — BUPIVACAINE-EPINEPHRINE (PF) 0.5% -1:200000 IJ SOLN
INTRAMUSCULAR | Status: DC | PRN
  Administered 2020-04-05: 10 mL via PERINEURAL

## 2020-04-05 MED ORDER — CHLORHEXIDINE GLUCONATE 0.12 % MT SOLN: 15.0000 mL | Freq: Once | OROMUCOSAL | Status: AC

## 2020-04-05 MED ORDER — PHENYLEPHRINE HCL-NACL 10-0.9 MG/250ML-% IV SOLN
INTRAVENOUS | Status: DC | PRN
  Administered 2020-04-05: 40 ug/min via INTRAVENOUS

## 2020-04-05 MED ORDER — SUCCINYLCHOLINE CHLORIDE 200 MG/10ML IV SOSY
PREFILLED_SYRINGE | INTRAVENOUS | Status: AC
  Filled 2020-04-05: qty 10

## 2020-04-05 MED ORDER — ONDANSETRON HCL 4 MG/2ML IJ SOLN: 4.0000 mg | Freq: Four times a day (QID) | INTRAMUSCULAR | Status: DC | PRN

## 2020-04-05 MED ORDER — LACTATED RINGERS IV SOLN: INTRAVENOUS | Status: DC

## 2020-04-05 MED ORDER — ROCURONIUM BROMIDE 100 MG/10ML IV SOLN
INTRAVENOUS | Status: DC | PRN
  Administered 2020-04-05: 20 mg via INTRAVENOUS
  Administered 2020-04-05: 50 mg via INTRAVENOUS
  Administered 2020-04-05: 10 mg via INTRAVENOUS

## 2020-04-05 MED ORDER — DEXAMETHASONE 4 MG PO TABS
4.0000 mg | ORAL_TABLET | Freq: Four times a day (QID) | ORAL | Status: AC
  Administered 2020-04-05 (×2): 4 mg via ORAL
  Filled 2020-04-05 (×2): qty 1

## 2020-04-05 MED ORDER — ONDANSETRON HCL 4 MG/2ML IJ SOLN
INTRAMUSCULAR | Status: AC
  Filled 2020-04-05: qty 2

## 2020-04-05 MED ORDER — PROPOFOL 10 MG/ML IV BOLUS
INTRAVENOUS | Status: DC | PRN
  Administered 2020-04-05: 160 mg via INTRAVENOUS

## 2020-04-05 MED ORDER — PHENOL 1.4 % MT LIQD: 1.0000 | OROMUCOSAL | Status: DC | PRN

## 2020-04-05 MED ORDER — 0.9 % SODIUM CHLORIDE (POUR BTL) OPTIME
TOPICAL | Status: DC | PRN
  Administered 2020-04-05: 1000 mL

## 2020-04-05 MED ORDER — PANTOPRAZOLE SODIUM 40 MG IV SOLR: 40.0000 mg | Freq: Every day | INTRAVENOUS | Status: DC

## 2020-04-05 MED ORDER — BACITRACIN ZINC 500 UNIT/GM EX OINT
TOPICAL_OINTMENT | CUTANEOUS | Status: AC
  Filled 2020-04-05: qty 28.35

## 2020-04-05 MED ORDER — AMLODIPINE BESYLATE 5 MG PO TABS
10.0000 mg | ORAL_TABLET | Freq: Every day | ORAL | Status: DC
  Administered 2020-04-06 – 2020-04-07 (×2): 10 mg via ORAL
  Filled 2020-04-05 (×2): qty 2

## 2020-04-05 MED ORDER — LIDOCAINE HCL (CARDIAC) PF 100 MG/5ML IV SOSY
PREFILLED_SYRINGE | INTRAVENOUS | Status: DC | PRN
  Administered 2020-04-05: 60 mg via INTRAVENOUS

## 2020-04-05 MED ORDER — VANCOMYCIN HCL IN DEXTROSE 1-5 GM/200ML-% IV SOLN
1000.0000 mg | INTRAVENOUS | Status: AC
  Administered 2020-04-05: 1000 mg via INTRAVENOUS
  Filled 2020-04-05: qty 200

## 2020-04-05 MED ORDER — MORPHINE SULFATE (PF) 4 MG/ML IV SOLN: 4.0000 mg | INTRAVENOUS | Status: DC | PRN

## 2020-04-05 MED ORDER — FENTANYL CITRATE (PF) 100 MCG/2ML IJ SOLN
INTRAMUSCULAR | Status: DC | PRN
  Administered 2020-04-05 (×4): 50 ug via INTRAVENOUS

## 2020-04-05 MED ORDER — AMISULPRIDE (ANTIEMETIC) 5 MG/2ML IV SOLN: 10.0000 mg | Freq: Once | INTRAVENOUS | Status: DC | PRN

## 2020-04-05 MED ORDER — CHLORHEXIDINE GLUCONATE 0.12 % MT SOLN
OROMUCOSAL | Status: AC
  Administered 2020-04-05: 15 mL via OROMUCOSAL
  Filled 2020-04-05: qty 15

## 2020-04-05 MED ORDER — DEXAMETHASONE SODIUM PHOSPHATE 10 MG/ML IJ SOLN
INTRAMUSCULAR | Status: AC
  Filled 2020-04-05: qty 1

## 2020-04-05 MED ORDER — OXYCODONE HCL 5 MG PO TABS: 10.0000 mg | ORAL_TABLET | ORAL | Status: DC | PRN

## 2020-04-05 MED ORDER — ACETAMINOPHEN 500 MG PO TABS
1000.0000 mg | ORAL_TABLET | Freq: Four times a day (QID) | ORAL | Status: AC
  Administered 2020-04-05 – 2020-04-06 (×3): 1000 mg via ORAL
  Filled 2020-04-05 (×3): qty 2

## 2020-04-05 SURGICAL SUPPLY — 67 items
APL SKNCLS STERI-STRIP NONHPOA (GAUZE/BANDAGES/DRESSINGS) ×1
BAND INSRT 18 STRL LF DISP RB (MISCELLANEOUS)
BAND RUBBER #18 3X1/16 STRL (MISCELLANEOUS) IMPLANT
BENZOIN TINCTURE PRP APPL 2/3 (GAUZE/BANDAGES/DRESSINGS) ×3 IMPLANT
BIT DRILL NEURO 2X3.1 SFT TUCH (MISCELLANEOUS) ×1 IMPLANT
BLADE SURG 15 STRL LF DISP TIS (BLADE) ×1 IMPLANT
BLADE SURG 15 STRL SS (BLADE) ×2
BLADE ULTRA TIP 2M (BLADE) ×2 IMPLANT
BUR BARREL STRAIGHT FLUTE 4.0 (BURR) ×2 IMPLANT
BUR MATCHSTICK NEURO 3.0 LAGG (BURR) ×3 IMPLANT
CANISTER SUCT 3000ML PPV (MISCELLANEOUS) ×2 IMPLANT
CARTRIDGE OIL MAESTRO DRILL (MISCELLANEOUS) ×1 IMPLANT
COVER MAYO STAND STRL (DRAPES) ×2 IMPLANT
COVER WAND RF STERILE (DRAPES) ×2 IMPLANT
DECANTER SPIKE VIAL GLASS SM (MISCELLANEOUS) ×2 IMPLANT
DIFFUSER DRILL AIR PNEUMATIC (MISCELLANEOUS) ×2 IMPLANT
DRAIN JACKSON PRATT 10MM FLAT (MISCELLANEOUS) ×1 IMPLANT
DRAPE LAPAROTOMY 100X72 PEDS (DRAPES) ×2 IMPLANT
DRAPE MICROSCOPE LEICA (MISCELLANEOUS) IMPLANT
DRAPE SURG 17X23 STRL (DRAPES) ×4 IMPLANT
DRILL NEURO 2X3.1 SOFT TOUCH (MISCELLANEOUS) ×2
DRSG OPSITE POSTOP 3X4 (GAUZE/BANDAGES/DRESSINGS) ×2 IMPLANT
DRSG OPSITE POSTOP 4X6 (GAUZE/BANDAGES/DRESSINGS) ×1 IMPLANT
ELECT BLADE 4.0 EZ CLEAN MEGAD (MISCELLANEOUS) ×2
ELECT REM PT RETURN 9FT ADLT (ELECTROSURGICAL) ×2
ELECTRODE BLDE 4.0 EZ CLN MEGD (MISCELLANEOUS) IMPLANT
ELECTRODE REM PT RTRN 9FT ADLT (ELECTROSURGICAL) ×1 IMPLANT
EVACUATOR SILICONE 100CC (DRAIN) ×1 IMPLANT
GAUZE 4X4 16PLY RFD (DISPOSABLE) IMPLANT
GLOVE BIO SURGEON STRL SZ8 (GLOVE) ×2 IMPLANT
GLOVE BIO SURGEON STRL SZ8.5 (GLOVE) ×2 IMPLANT
GLOVE BIOGEL PI IND STRL 7.5 (GLOVE) IMPLANT
GLOVE BIOGEL PI INDICATOR 7.5 (GLOVE) ×2
GLOVE EXAM NITRILE XL STR (GLOVE) IMPLANT
GLOVE SS BIOGEL STRL SZ 7 (GLOVE) IMPLANT
GLOVE SUPERSENSE BIOGEL SZ 7 (GLOVE) ×2
GOWN STRL REUS W/ TWL LRG LVL3 (GOWN DISPOSABLE) IMPLANT
GOWN STRL REUS W/ TWL XL LVL3 (GOWN DISPOSABLE) IMPLANT
GOWN STRL REUS W/TWL LRG LVL3 (GOWN DISPOSABLE) ×6
GOWN STRL REUS W/TWL XL LVL3 (GOWN DISPOSABLE) ×2
HEMOSTAT POWDER KIT SURGIFOAM (HEMOSTASIS) ×3 IMPLANT
KIT BASIN OR (CUSTOM PROCEDURE TRAY) ×2 IMPLANT
KIT TURNOVER KIT B (KITS) ×2 IMPLANT
MARKER SKIN DUAL TIP RULER LAB (MISCELLANEOUS) ×2 IMPLANT
NDL SPNL 18GX3.5 QUINCKE PK (NEEDLE) ×1 IMPLANT
NEEDLE HYPO 22GX1.5 SAFETY (NEEDLE) ×2 IMPLANT
NEEDLE SPNL 18GX3.5 QUINCKE PK (NEEDLE) ×2 IMPLANT
NS IRRIG 1000ML POUR BTL (IV SOLUTION) ×2 IMPLANT
OIL CARTRIDGE MAESTRO DRILL (MISCELLANEOUS) ×2
PACK LAMINECTOMY NEURO (CUSTOM PROCEDURE TRAY) ×2 IMPLANT
PATTIES SURGICAL .5 X.5 (GAUZE/BANDAGES/DRESSINGS) ×1 IMPLANT
PATTIES SURGICAL 1X1 (DISPOSABLE) ×1 IMPLANT
PEEK OPTIMA 14X14X10 (Peek) ×2 IMPLANT
PENCIL BUTTON HOLSTER BLD 10FT (ELECTRODE) ×1 IMPLANT
PIN DISTRACTION 14MM (PIN) ×4 IMPLANT
PLATE ANT CERV XTEND 2 LV 40 (Plate) ×1 IMPLANT
PUTTY DBM 2CC CALC GRAN (Putty) ×2 IMPLANT
SCREW VAR 4.2 XD SELF DRILL 14 (Screw) ×6 IMPLANT
SPONGE INTESTINAL PEANUT (DISPOSABLE) ×3 IMPLANT
SPONGE SURGIFOAM ABS GEL SZ50 (HEMOSTASIS) IMPLANT
STRIP CLOSURE SKIN 1/2X4 (GAUZE/BANDAGES/DRESSINGS) ×2 IMPLANT
SUT VIC AB 0 CT1 27 (SUTURE) ×2
SUT VIC AB 0 CT1 27XBRD ANTBC (SUTURE) ×1 IMPLANT
SUT VIC AB 3-0 SH 8-18 (SUTURE) ×2 IMPLANT
TOWEL GREEN STERILE (TOWEL DISPOSABLE) ×2 IMPLANT
TOWEL GREEN STERILE FF (TOWEL DISPOSABLE) ×2 IMPLANT
WATER STERILE IRR 1000ML POUR (IV SOLUTION) ×2 IMPLANT

## 2020-04-05 NOTE — Anesthesia Procedure Notes (Signed)
Procedure Name: Intubation Date/Time: 04/05/2020 12:40 PM Performed by: Marena Chancy, CRNA Pre-anesthesia Checklist: Patient identified, Emergency Drugs available, Suction available and Patient being monitored Patient Re-evaluated:Patient Re-evaluated prior to induction Oxygen Delivery Method: Circle System Utilized and Circle system utilized Preoxygenation: Pre-oxygenation with 100% oxygen Induction Type: IV induction Ventilation: Mask ventilation without difficulty Laryngoscope Size: Glidescope and 4 Grade View: Grade I Tube type: Oral Tube size: 7.5 mm Number of attempts: 1 Airway Equipment and Method: Oral airway,  Rigid stylet and Video-laryngoscopy Placement Confirmation: ETT inserted through vocal cords under direct vision,  positive ETCO2 and breath sounds checked- equal and bilateral Secured at: 24 cm Tube secured with: Tape Dental Injury: Teeth and Oropharynx as per pre-operative assessment  Comments: ETT inserted by Leonard Downing SRNA

## 2020-04-05 NOTE — Progress Notes (Signed)
Orthopedic Tech Progress Note Patient Details:  Shannon [name] [birth year] [id number] Called in order to [facility] for an [facility] CERVICAL COLLAR Patient ID: [name], male   DOB: January 08, 1946, 74 y.o.   MRN: [id number]   [name] 04/05/2020, 4:43 PM

## 2020-04-05 NOTE — Op Note (Signed)
Brief history: The patient is a 74 year old black male with a history of Chiari I malformation and syringomyelia who presented with progressive neck pain, Quadraparesis, hand weakness, unsteady gait, etc. 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  I discussed the various treatment options with him including surgery.  He has weighed the risk, benefits and alternatives and decided proceed with a C3-4 and C4-5 anterior cervicectomy, fusion and plating.  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 Delma Officer  Asst.: Dr. Coletta Memos  Anesthesia: Gen. endotracheal  Estimated blood loss: 200 cc  Drains: 1 flat Jackson-Pratt drain in the prevertebral space  Complications: None  Description of procedure: The patient was brought to the operating room by the anesthesia team. General endotracheal anesthesia was induced. A roll was placed under the patient's shoulders to keep the neck in the neutral position. The patient's anterior cervical region was then prepared with Betadine scrub and Betadine solution. Sterile drapes were applied.  The area to be incised was then injected with Marcaine with epinephrine solution. I then used a scalpel to make a transverse incision in the patient's left anterior neck. I used the Metzenbaum scissors to divide the platysmal muscle and then to dissect medial to the sternocleidomastoid muscle, jugular vein, and carotid artery. I carefully dissected down towards the anterior  cervical spine identifying the esophagus and retracting it medially. Then using Kitner swabs to clear soft tissue from the anterior cervical spine. We then inserted a bent spinal needle into the upper exposed intervertebral disc space. We then obtained intraoperative radiographs confirm our location.  I then used electrocautery to detach the medial border of the longus colli muscle bilaterally from the C3-4 and C4-5 intervertebral disc spaces. I then inserted the Caspar self-retaining retractor underneath the longus colli muscle bilaterally to provide exposure.  The patient had huge ventral osteophytes which required quite a bit of drilling just to get down to the cervical spine.  We then incised the intervertebral disc at C3-4. We then performed a partial intervertebral discectomy with a pituitary forceps and the Karlin curettes. I then inserted distraction screws into the vertebral bodies at C3 and C4. We then distracted the interspace. We then used the high-speed drill to decorticate the vertebral endplates at C3-4, to drill away the remainder of the intervertebral disc, to drill away some posterior spondylosis, and to thin out the posterior longitudinal ligament. I then incised ligament with the arachnoid knife. We then removed the ligament with a Kerrison punches undercutting the vertebral endplates and decompressing the thecal sac. We then performed foraminotomies about the bilateral C4 nerve roots. This completed the decompression at this level.  We then repeated this procedure in an analogous fashion at C4-5 decompressing the thecal sac and the bilateral C5 nerve roots.  We now turned our to attention to the interbody fusion. We used the trial spacers to determine the appropriate size for the interbody prosthesis. We then pre-filled prosthesis with a combination of local morcellized autograft bone that we obtained during decompression as well as Zimmer DBM. We then inserted the prosthesis into the  distracted interspace at C3-4  and C4-5. We then removed the distraction screws. There was a good snug fit of the prosthesis in the interspace.  Having completed the fusion we now turned attention to the anterior spinal instrumentation. We used the high-speed drill to drill away some anterior spondylosis at the disc spaces so that the plate lay down flat. We selected the appropriate length titanium anterior cervical plate. We laid it along the anterior aspect of the vertebral bodies from C3-C5. We then drilled 14 mm holes at C3, C4 and C5. We then secured the plate to the vertebral bodies by placing two 14 mm self-drilling screws at C3, C4 and C5. We then obtained intraoperative radiograph. The demonstrating good position of the instrumentation. We therefore secured the screws the plate the locking each cam. This completed the instrumentation.  We then obtained hemostasis using bipolar electrocautery. We irrigated the wound out with bacitracin solution. We then removed the retractor. We inspected the esophagus for any damage. There was none apparent.  We then placed a 10 mm flat Jackson-Pratt drain in the prevertebral space and tunneled it out through a separate stab wound.  We then reapproximated patient's platysmal muscle with interrupted 3-0 Vicryl suture. We then reapproximated the subcutaneous tissue with interrupted 3-0 Vicryl suture. The skin was reapproximated with Steri-Strips and benzoin. The wound was then covered with bacitracin ointment. A sterile dressing was applied. The drapes were removed.  All sponge instrument and needle counts were reportedly correct at the end of this case.

## 2020-04-05 NOTE — H&P (Signed)
Subjective: The patient is a 74 year old black male with a complicated history.  He has a history of Chiari malformation, syringomyelia, and surgery.  He has complained of new left arm pain and numbness chronic right arm pain numbness tingling weakness, unsteady gait, difficulty using his hands, etc.  He was worked up with a cervical MRI which demonstrated significant stenosis at C3-4 and C4-5 as well was diffuse cervical spinal cord atrophy.  I discussed the various treatment options with him.  He has decided to proceed with surgery.  Past Medical History:  Diagnosis Date  . Asthma   . 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 Newell Coral, 1992-->surg at [facility].  Summer 2021->Cervicalgia,arm pain, hand atrophy RUE wkness, hyperreflex (Dr. 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 Hay fever   . Hypertension   . Osteoarthritis of both hands     Past Surgical History:  Procedure Laterality Date  . BACK SURGERY    . INCISION AND DRAINAGE Left 2012   L hand infection  . ROTATOR CUFF REPAIR Right    x 3  . SPINE SURGERY      Allergies  Allergen Reactions  . Iodine Swelling  . Penicillins Swelling    Facial swelling, itchy throat    Social History   Tobacco Use  . Smoking status: Former Games developer  . Smokeless tobacco: Never Used  Substance Use Topics  . Alcohol use: Not Currently    Family History  Problem Relation Age of Onset  . Heart attack Mother   . Heart disease Mother   . High blood pressure Mother   . Alcohol abuse Father   . Cancer Father   . Diabetes Father   . High blood pressure Father    Prior to Admission medications   Medication Sig Start Date End Date Taking? Authorizing Provider  amLODipine (NORVASC) 10 MG tablet Take 1 tablet (10 mg total) by mouth daily. 02/16/20  Yes McGowen, Maryjean Morn, MD     Review of Systems  Positive  ROS: As above  All other systems have been reviewed and were otherwise negative with the exception of those mentioned in the HPI and as above.  Objective: Vital signs in last 24 hours: Temp:  [99.4 F (37.4 C)] 99.4 F (37.4 C) (10/13 1011) Pulse Rate:  [86-103] 86 (10/13 1043) Resp:  [18] 18 (10/13 1011) BP: (140)/(74) 140/74 (10/13 1011) SpO2:  [98 %] 98 % (10/13 1011) Weight:  [90.9 kg] 90.9 kg (10/13 1011) Estimated body mass index is 28.75 kg/m as calculated from the following:   Height as of this encounter: 5\' 10"  (1.778 m).   Weight as of this encounter: 90.9 kg.   General Appearance: Alert Head: Normocephalic, without obvious abnormality, atraumatic Eyes: PERRL, conjunctiva/corneas clear, EOM's intact,    Ears: Normal  Throat: Normal  Neck: Supple, Back: unremarkable Lungs: Clear to auscultation bilaterally, respirations unlabored Heart: Regular rate and rhythm, no murmur, rub or gallop Abdomen: Soft, non-tender Extremities: Extremities normal, atraumatic, no cyanosis or edema Skin: unremarkable  NEUROLOGIC:   Mental status: alert and oriented,Motor Exam -the patient has 0/5 right deltoid strength, 4/5 right bicep and tricep strength, 2/5 right wrist extensor strength and 4-/5 left wrist extensor strength.  He has weakened grips bilaterally. Sensory Exam - grossly normal Reflexes: Increased Coordination - grossly normal Gait -unsteady Balance - grossly normal Cranial Nerves: I: smell  Not tested  II: visual acuity  OS: Normal  OD: Normal   II: visual fields Full to confrontation  II: pupils Equal, round, reactive to light  III,VII: ptosis None  III,IV,VI: extraocular muscles  Full ROM  V: mastication Normal  V: facial light touch sensation  Normal  V,VII: corneal reflex  Present  VII: facial muscle function - upper  Normal  VII: facial muscle function - lower Normal  VIII: hearing Not tested  IX: soft palate elevation  Normal  IX,X: gag reflex Present   XI: trapezius strength  5/5  XI: sternocleidomastoid strength 5/5  XI: neck flexion strength  5/5  XII: tongue strength  Normal    Data Review Lab Results  Component Value Date   WBC 5.7 04/04/2020   HGB 14.9 04/04/2020   HCT 46.7 04/04/2020   MCV 88.6 04/04/2020   PLT 175 04/04/2020   Lab Results  Component Value Date   NA 141 04/04/2020   K 3.7 04/04/2020   CL 107 04/04/2020   CO2 20 (L) 04/04/2020   BUN 10 04/04/2020   CREATININE 1.13 04/04/2020   GLUCOSE 120 (H) 04/04/2020   No results found for: INR, PROTIME  Assessment/Plan: 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  I reviewed his imaging studies with him and pointed out the abnormalities.  We have discussed the various treatment options including surgery.  I have described the surgical treatment option of a C3-4 and C4-5 anterior cervical discectomy, fusion and plating to decompress the spinal cord.  I have shown him surgical models.  We have discussed the risk, benefits, alternatives, expected postoperative course, and likelihood of achieving our goals with surgery.  I have answered all his questions.  He has decided to proceed with surgery.   Cristi Loron 04/05/2020 11:50 AM

## 2020-04-05 NOTE — Progress Notes (Signed)
Pharmacy Antibiotic Note  Shannon [name] is a 74 y.o. 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 Pt is S/P cervical discectomy/decompression and insertion of interbody prosthesis this afternoon. Pharmacy has been consulted for vancomycin dosing for post-operative surgical prophylaxis.  WBC 5.7, afebrile, Scr 1.13, CrCl 66 ml/min (renal function stable over past yr, per Scr values in Epic)  Pt rec'd vancomycin 1 gm IV X 1 pre op at 11:29 AM today. Per surgeon note, pt has drain in place post op.  Plan: Vancomycin 1 gram IV Q 12 hrs, per [place] vancomycin protocol, first dose at 1930 PM this evening (goal vancomycin trough: 10-15 mg/L for surgical prophylaxis) Monitor WBC, temp, clinical improvement, renal function  Height: 5\' 10"  (177.8 cm) Weight: 90.9 kg (200 lb 6.4 oz) IBW/kg (Calculated) : 73  Temp (24hrs), Avg:98.6 F (37 C), Min:97.7 F (36.5 C), Max:99.4 F (37.4 C)  Recent Labs  Lab 04/04/20 1037  WBC 5.7  CREATININE 1.13    Estimated Creatinine Clearance: 66 mL/min (by C-G formula based on SCr of 1.13 mg/dL).    Allergies  Allergen Reactions  . Iodine Swelling  . Penicillins Swelling    Facial swelling, itchy throat    Microbiology results: 10/12 MRSA PCR: positive 10/12 COVID: negative  Thank you for allowing pharmacy to be a part of this patient's care.  12/12, PharmD, BCPS, [facility] Clinical Pharmacist 04/05/2020 6:28 PM

## 2020-04-05 NOTE — Progress Notes (Signed)
Subjective: The patient is somnolent but arousable.  He is in no apparent distress.  Objective: Vital signs in last 24 hours: Temp:  [97.7 F (36.5 C)-99.4 F (37.4 C)] 97.7 F (36.5 C) (10/13 1630) Pulse Rate:  [86-103] 86 (10/13 1043) Resp:  [18] 18 (10/13 1011) BP: (140)/(74) 140/74 (10/13 1011) SpO2:  [98 %] 98 % (10/13 1011) Weight:  [90.9 kg] 90.9 kg (10/13 1011) Estimated body mass index is 28.75 kg/m as calculated from the following:   Height as of this encounter: 5\' 10"  (1.778 m).   Weight as of this encounter: 90.9 kg.   Intake/Output from previous day: No intake/output data recorded. Intake/Output this shift: Total I/O In: 2000 [I.V.:2000] Out: 400 [Blood:400]  Physical exam the patient is somnolent but arousable.  He is moving all 4 extremities without change from his preoperative status.  He has weakness predominantly in his right arm.  His dressing is clean and dry.  There is no hematoma or shift.  Lab Results: Recent Labs    04/04/20 1037  WBC 5.7  HGB 14.9  HCT 46.7  PLT 175   BMET Recent Labs    04/04/20 1037  NA 141  K 3.7  CL 107  CO2 20*  GLUCOSE 120*  BUN 10  CREATININE 1.13  CALCIUM 9.4    Studies/Results: No results found.  Assessment/Plan: The patient is doing well.  I spoke with his daughter.  LOS: 0 days     06/04/20 04/05/2020, 4:44 PM

## 2020-04-05 NOTE — Transfer of Care (Signed)
Immediate Anesthesia Transfer of Care Note  Patient: Shannon [name]  Procedure(s) Performed: ANTERIOR CERVICAL DECOMPRESSION/DISCECTOMY FUSION, INTERBODY PROSTHESIS, PLATE/SCREWS CERVICAL THREE-CERVICAL FOUR, CERVICAL FOUR- CERVICAL FIVE (N/A )  Patient Location: PACU  Anesthesia Type:General  Level of Consciousness: awake and alert   Airway & Oxygen Therapy: Patient Spontanous Breathing  Post-op Assessment: Report given to RN and Post -op Vital signs reviewed and stable  Post vital signs: Reviewed and stable  Last Vitals:  Vitals Value Taken Time  BP 110/64 04/05/20 1630  Temp    Pulse 84 04/05/20 1630  Resp 16 04/05/20 1630  SpO2 99 % 04/05/20 1630  Vitals shown include unvalidated device data.  Last Pain:  Vitals:   04/05/20 1043  TempSrc:   PainSc: 3       Patients Stated Pain Goal: 3 (04/05/20 1043)  Complications: No complications documented.

## 2020-04-06 DIAGNOSIS — M25511 Pain in right shoulder: Secondary | ICD-10-CM | POA: Diagnosis present

## 2020-04-06 DIAGNOSIS — G629 Polyneuropathy, unspecified: Secondary | ICD-10-CM | POA: Diagnosis present

## 2020-04-06 DIAGNOSIS — G935 Compression of brain: Secondary | ICD-10-CM | POA: Diagnosis present

## 2020-04-06 DIAGNOSIS — S14129D Central cord syndrome at unspecified level of cervical spinal cord, subsequent encounter: Secondary | ICD-10-CM | POA: Diagnosis not present

## 2020-04-06 DIAGNOSIS — R400 Somnolence: Secondary | ICD-10-CM | POA: Diagnosis present

## 2020-04-06 DIAGNOSIS — M792 Neuralgia and neuritis, unspecified: Secondary | ICD-10-CM | POA: Diagnosis not present

## 2020-04-06 DIAGNOSIS — K5901 Slow transit constipation: Secondary | ICD-10-CM | POA: Diagnosis present

## 2020-04-06 DIAGNOSIS — Z91048 Other nonmedicinal substance allergy status: Secondary | ICD-10-CM | POA: Diagnosis not present

## 2020-04-06 DIAGNOSIS — Z811 Family history of alcohol abuse and dependence: Secondary | ICD-10-CM | POA: Diagnosis not present

## 2020-04-06 DIAGNOSIS — M4722 Other spondylosis with radiculopathy, cervical region: Secondary | ICD-10-CM | POA: Diagnosis present

## 2020-04-06 DIAGNOSIS — Z20822 Contact with and (suspected) exposure to covid-19: Secondary | ICD-10-CM | POA: Diagnosis present

## 2020-04-06 DIAGNOSIS — Z833 Family history of diabetes mellitus: Secondary | ICD-10-CM | POA: Diagnosis not present

## 2020-04-06 DIAGNOSIS — M19042 Primary osteoarthritis, left hand: Secondary | ICD-10-CM | POA: Diagnosis present

## 2020-04-06 DIAGNOSIS — G950 Syringomyelia and syringobulbia: Secondary | ICD-10-CM | POA: Diagnosis present

## 2020-04-06 DIAGNOSIS — Z981 Arthrodesis status: Secondary | ICD-10-CM | POA: Diagnosis not present

## 2020-04-06 DIAGNOSIS — D62 Acute posthemorrhagic anemia: Secondary | ICD-10-CM | POA: Diagnosis present

## 2020-04-06 DIAGNOSIS — G959 Disease of spinal cord, unspecified: Secondary | ICD-10-CM | POA: Diagnosis present

## 2020-04-06 DIAGNOSIS — G8250 Quadriplegia, unspecified: Secondary | ICD-10-CM | POA: Diagnosis present

## 2020-04-06 DIAGNOSIS — Z888 Allergy status to other drugs, medicaments and biological substances status: Secondary | ICD-10-CM | POA: Diagnosis not present

## 2020-04-06 DIAGNOSIS — Z4789 Encounter for other orthopedic aftercare: Secondary | ICD-10-CM | POA: Diagnosis present

## 2020-04-06 DIAGNOSIS — M4712 Other spondylosis with myelopathy, cervical region: Secondary | ICD-10-CM | POA: Diagnosis present

## 2020-04-06 DIAGNOSIS — M5412 Radiculopathy, cervical region: Secondary | ICD-10-CM | POA: Diagnosis present

## 2020-04-06 DIAGNOSIS — G8929 Other chronic pain: Secondary | ICD-10-CM | POA: Diagnosis present

## 2020-04-06 DIAGNOSIS — M19041 Primary osteoarthritis, right hand: Secondary | ICD-10-CM | POA: Diagnosis present

## 2020-04-06 DIAGNOSIS — W06XXXA Fall from bed, initial encounter: Secondary | ICD-10-CM | POA: Diagnosis not present

## 2020-04-06 DIAGNOSIS — G8918 Other acute postprocedural pain: Secondary | ICD-10-CM | POA: Diagnosis not present

## 2020-04-06 DIAGNOSIS — Z87891 Personal history of nicotine dependence: Secondary | ICD-10-CM | POA: Diagnosis not present

## 2020-04-06 DIAGNOSIS — J45909 Unspecified asthma, uncomplicated: Secondary | ICD-10-CM | POA: Diagnosis present

## 2020-04-06 DIAGNOSIS — R2681 Unsteadiness on feet: Secondary | ICD-10-CM | POA: Diagnosis present

## 2020-04-06 DIAGNOSIS — Z79899 Other long term (current) drug therapy: Secondary | ICD-10-CM | POA: Diagnosis not present

## 2020-04-06 DIAGNOSIS — R490 Dysphonia: Secondary | ICD-10-CM | POA: Diagnosis present

## 2020-04-06 DIAGNOSIS — X58XXXD Exposure to other specified factors, subsequent encounter: Secondary | ICD-10-CM | POA: Diagnosis present

## 2020-04-06 DIAGNOSIS — Z8249 Family history of ischemic heart disease and other diseases of the circulatory system: Secondary | ICD-10-CM | POA: Diagnosis not present

## 2020-04-06 DIAGNOSIS — Z880 Allergy status to penicillin: Secondary | ICD-10-CM | POA: Diagnosis not present

## 2020-04-06 DIAGNOSIS — I10 Essential (primary) hypertension: Secondary | ICD-10-CM | POA: Diagnosis present

## 2020-04-06 MED FILL — Thrombin For Soln 5000 Unit: CUTANEOUS | Qty: 5000 | Status: AC

## 2020-04-06 NOTE — Progress Notes (Signed)
Patient ID: Shannon [name], male   DOB: 1945-09-08, 74 y.o.   MRN: [id number] BP 124/69 (BP Location: Left Arm)   Pulse 86   Temp 98.4 F (36.9 C) (Oral)   Resp 18   Ht 5\' 10"  (1.778 m)   Wt 90.9 kg   SpO2 94%   BMI 28.75 kg/m  Alert and oriented x 4 3/5 upper extremities Wound is clean,dry, no signs of infection Will remove drain tomorrow Change hospital status to admission Continue pt/ot

## 2020-04-06 NOTE — Evaluation (Addendum)
Physical Therapy Evaluation Patient Details Name: Shannon [name] MRN: [id number] DOB: [birth year] Today's Date: 04/06/2020   History of Present Illness  Pt is 74 y.o. male presenting with progressive neck pain, quadraparesis, hand weakness, and unsteady gait. 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 Patient s/p anterior cervical discectomy/decompression, interbody arthrodesis, and plating on 10/13 by Dr. Lovell Sheehan. PMHx significatn for Chiari I malformation, syringomyelia, asthma, HNT, and OA.  Clinical Impression   Patient is s/p C3-4 and C4-5 ACDF due to stenosis with progressive UE weakness.  Pt presents with decreased safety awareness, balance, mobility, and UE strength/ROM/coordination. With ambulation pt had multiple LOB requiring assist to correct, also required cues for RW use.  Pt had difficulty maintaining grip on RW due to UE weakness, but without RW had increased instability and decreased support of L UE (fear of subluxation as arm would just dangle). Pt had difficulty with transfers due to decreased strength in bil UE and significant difficulties with ADLs (PT had to assist with washing hands, turning on/off water, etc).  Pt lives alone but daughter is able to assist but for limited time (initially planned on Sunday, but states could do more if needed).  However, with significant UE weakness and decreased safety recommend further rehab prior to returning home.  Patient will benefit from skilled PT to increase their independence and safety with mobility (while adhering to their precautions) to allow discharge to the venue listed below.  Do question if there may be insurance barriers for further rehab due to outpatient surgery status - may need further assessment if insurance denies coverage.      Follow Up Recommendations CIR    Equipment Recommendations  Rolling walker with 5" wheels    Recommendations for Other Services Rehab consult     Precautions / Restrictions  Precautions Precautions: Cervical Precaution Booklet Issued: Yes (comment) Required Braces or Orthoses: Cervical Brace Cervical Brace: Hard collar;Other (comment) (when OOB - applied in sitting) Restrictions Weight Bearing Restrictions: No      Mobility  Bed Mobility Overal bed mobility: Needs Assistance Bed Mobility: Rolling;Sidelying to Sit;Sit to Sidelying Rolling: Min assist Sidelying to sit: Min assist   Sit to supine: Min assist Sit to sidelying: Min assist General bed mobility comments: Performed all above x 2; cues for safety and log roll technique  Transfers Overall transfer level: Needs assistance Equipment used: 1 person hand held assist;Rolling walker (2 wheeled) Transfers: Sit to/from Stand Sit to Stand: Min guard;Min assist         General transfer comment: Performed STS from elevated bed (min G) and from toilet (min A).  Cues for safety and increased time and effort.  Performed x 3  Ambulation/Gait Ambulation/Gait assistance: Min assist Gait Distance (Feet): 200 Feet Assistive device: Rolling walker (2 wheeled);1 person hand held assist Gait Pattern/deviations: Step-through pattern;Narrow base of support;Scissoring Gait velocity: decreased   General Gait Details: Pt with occasional scissoring and frequent narrow BOS.  He had 3 LOB requiring min A to recover.  Started with RW - required assist to place hands on walker, frequent cues for proximity, and how to use in turns and assistance to turn.  Then tried HHA as pt having difficulty holding RW, provided HHA on L (to support shoulder), continued to need cues for safety  Stairs            Wheelchair Mobility    Modified Rankin (Stroke Patients Only)       Balance Overall balance assessment: Needs assistance  Sitting-balance support: No upper extremity supported Sitting balance-Leahy Scale: Fair     Standing balance support: Single extremity supported;Bilateral upper extremity supported;No upper  extremity supported Standing balance-Leahy Scale: Poor Standing balance comment: Required at least single UE support or leaning on counter                             Pertinent Vitals/Pain Pain Assessment: 0-10 Pain Score: 5  Pain Location: Neck and R UE Pain Descriptors / Indicators: Dull;Aching Pain Intervention(s): Limited activity within patient's tolerance;Monitored during session;Repositioned;Relaxation;Other (comment) (noted pt declining meds per his discussion with nurse)    Home Living Family/patient expects to be discharged to:: Private residence Living Arrangements: Alone Available Help at Discharge: Family;Friend(s);Available PRN/intermittently Type of Home: House Home Access: Stairs to enter Entrance Stairs-Rails: Right Entrance Stairs-Number of Steps: 5 Home Layout: One level Home Equipment: None      Prior Function Level of Independence: Independent   Gait / Transfers Assistance Needed: Patient reports ambulating without use of AD. Questionable safety given patients deficits prior to and after surgery.   ADL's / Homemaking Assistance Needed: Patient reports being able to don clothing with increased time (noting 20 minutes to don footwear alone).  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 Pt with weakness in L UE that began around 7/21.  He went to ED thinking possible cardiac issues but determined just weakness, eventually saw neurosurgeon and now had surgery.  Reports L UE weakness was gradually worsening, but reports seems worse after this surgery.     Hand Dominance   Dominant Hand: Right    Extremity/Trunk Assessment   Upper Extremity Assessment Upper Extremity Assessment: Defer to OT evaluation (Defer to OT for details but demonstrating significant weaknesses in L UE (particularly shoulder and elbow); also R UE weakness but not as severe) RUE Deficits / Details: Activation of scapular  musculature. Weak shoulder protraction/retraction. Patient also able to make weak fist. Patient is unable to actively flex shoulder, elbow, or wrist.  RUE Sensation: decreased light touch RUE Coordination: decreased fine motor;decreased gross motor LUE Deficits / Details: Activation of scapular musculature. Weak shoulder protraction/retraction. Patient also able to make weak fist. Patient is unable to actively flex shoulder, elbow, or wrist.  LUE Sensation: decreased light touch LUE Coordination: decreased fine motor;decreased gross motor    Lower Extremity Assessment Lower Extremity Assessment: LLE deficits/detail;RLE deficits/detail RLE Deficits / Details: ROM WFL; MMT 5/5 RLE Sensation: WNL LLE Deficits / Details: ROM WFL; MMT 5/5 LLE Sensation: WNL    Cervical / Trunk Assessment Cervical / Trunk Assessment: Other exceptions Cervical / Trunk Exceptions: s/p cervical surgery  Communication   Communication: No difficulties  Cognition Arousal/Alertness: Awake/alert Behavior During Therapy: WFL for tasks assessed/performed Overall Cognitive Status: Within Functional Limits for tasks assessed                                 General Comments: Pt follows commands and is oriented.  He often required redirecting as would go on tangents.  But also demonstrates decreased safety awareness and awareness of deficits.  Pt reports he was caught going to the bathroom independently earlier by staff and did not understand why it was a problem.  States "If you believe you can do it then you will be able to"  General Comments General comments (skin integrity, edema, etc.):   Pt's daughter present throughout session.  Educated on cervical precautions and brace -when to wear and how to don/doff.   Also, discussed therapy recommendations for further rehab at discharge due to decreased safety and ability to care for self.  Daughter reports could provide support for  a while but unsure  how long.  Discussed unable to determine how long it would take pt to recover - so recommend rehab to progress.  Daughter asked how long before we know if he can go to rehab - notified would put in notes and case managers would be in touch to determine availability and insurance coverage    Exercises     Assessment/Plan    PT Assessment Patient needs continued PT services  PT Problem List Decreased strength;Decreased mobility;Decreased safety awareness;Decreased range of motion;Decreased coordination;Decreased knowledge of precautions;Decreased activity tolerance;Decreased balance;Decreased knowledge of use of DME       PT Treatment Interventions DME instruction;Therapeutic activities;Gait training;Therapeutic exercise;Patient/family education;Modalities;Balance training;Functional mobility training;Stair training;Neuromuscular re-education    PT Goals (Current goals can be found in the Care Plan section)  Acute Rehab PT Goals Patient Stated Goal: To be independent again PT Goal Formulation: With patient/family Time For Goal Achievement: 04/20/20 Potential to Achieve Goals: Good    Frequency Min 5X/week   Barriers to discharge Decreased caregiver support      Co-evaluation               AM-PAC PT "6 Clicks" Mobility  Outcome Measure Help needed turning from your back to your side while in a flat bed without using bedrails?: A Little Help needed moving from lying on your back to sitting on the side of a flat bed without using bedrails?: A Little Help needed moving to and from a bed to a chair (including a wheelchair)?: A Little Help needed standing up from a chair using your arms (e.g., wheelchair or bedside chair)?: A Little Help needed to walk in hospital room?: A Little Help needed climbing 3-5 steps with a railing? : A Lot 6 Click Score: 17    End of Session Equipment Utilized During Treatment: Gait belt Activity Tolerance: Patient tolerated treatment well Patient  left: in bed;with call bell/phone within reach;with bed alarm set;with family/visitor present Nurse Communication: Mobility status PT Visit Diagnosis: Unsteadiness on feet (R26.81);Muscle weakness (generalized) (M62.81)    Time: 0737-1062 PT Time Calculation (min) (ACUTE ONLY): 49 min   Charges:   PT Evaluation $PT Eval Moderate Complexity: 1 Mod PT Treatments $Gait Training: 8-22 mins $Therapeutic Activity: 8-22 mins        Anise Salvo, PT Acute Rehab Services Pager [phone] [facility] [phone]    Rayetta Humphrey 04/06/2020, 1:52 PM

## 2020-04-06 NOTE — Anesthesia Postprocedure Evaluation (Signed)
Anesthesia Post Note  Patient: Shannon [name]  Procedure(s) Performed: ANTERIOR CERVICAL DECOMPRESSION/DISCECTOMY FUSION, INTERBODY PROSTHESIS, PLATE/SCREWS CERVICAL THREE-CERVICAL FOUR, CERVICAL FOUR- CERVICAL FIVE (N/A )     Patient location during evaluation: PACU Anesthesia Type: General Level of consciousness: awake and alert Pain management: pain level controlled Vital Signs Assessment: post-procedure vital signs reviewed and stable Respiratory status: spontaneous breathing, nonlabored ventilation, respiratory function stable and patient connected to nasal cannula oxygen Cardiovascular status: blood pressure returned to baseline and stable Postop Assessment: no apparent nausea or vomiting Anesthetic complications: no   No complications documented.  Last Vitals:  Vitals:   04/06/20 0416 04/06/20 0758  BP: (!) 143/71 (!) 134/91  Pulse: 79 (!) 108  Resp: 20 18  Temp: 36.9 C 36.8 C  SpO2: 98% 100%    Last Pain:  Vitals:   04/06/20 0758  TempSrc: Oral  PainSc:                  [name]

## 2020-04-06 NOTE — Progress Notes (Signed)
Occupational Therapy Evaluation Patient Details Name: Shannon [name] MRN: [id number] DOB: 02-07-1946 Today's Date: 04/06/2020    History of Present Illness 74 y.o. male presenting with progressive neck pain, quadraparesis, hand weakness, and unsteady gait. 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 Patient s/p anterior cervical discectomy/decompression, interbody arthrodesis, and plating on 10/13 by Dr. Arnoldo Morale. PMHx significatn for Chiari I malformation, syringomyelia, asthma, HNT, and OA.   Clinical Impression   PTA patient was living alone in a single-level private residence with 5 STE and R rail ascending. Patient reports recent increased difficulty with BADLs including LB bathing/dressing requiring 20 minutes to don socks, self-feeding and IADLs including meal prep. Patient notes weakness greater in RUE than in LUE prior to surgical intervention. Patient currently presents below baseline level of function requiring hand over hand assist to wash hands, manage faucet on sink and doff/don footwear seated EOB. Patient able to complete toilet transfer with Min guard without functional use of BUE but would likely require Max A for hygiene/clothing management 2/2 BUE deficits. Patient would benefit from intensive therapy in the setting of CIR to maximize safety and independence with self-care tasks in prep for safe return home.     Follow Up Recommendations  CIR    Equipment Recommendations  Other (comment) (Defer to next level of care)    Recommendations for Other Services Rehab consult     Precautions / Restrictions Precautions Precautions: Cervical Precaution Booklet Issued: Yes (comment) Required Braces or Orthoses: Cervical Brace Cervical Brace: Hard collar Restrictions Weight Bearing Restrictions: No      Mobility Bed Mobility Overal bed mobility: Needs Assistance Bed Mobility: Sit to Supine       Sit to supine: Min assist   General bed mobility comments: Min A for  sit to supine to assist BLE from EOB to bed level.   Transfers Overall transfer level: Needs assistance Equipment used: None Transfers: Sit to/from Stand Sit to Stand: Min guard;Min assist         General transfer comment: Min guard to Min A for STS transfers. Patient unable to use BUE to assist with boost.     Balance Overall balance assessment: Mild deficits observed, not formally tested                                         ADL either performed or assessed with clinical judgement   ADL Overall ADL's : Needs assistance/impaired Eating/Feeding: Maximal assistance Eating/Feeding Details (indicate cue type and reason): Hand over hand assist to bring cup to mouth 2/2 deficits.  Grooming: Maximal assistance Grooming Details (indicate cue type and reason): Patient required Max A to wash hands standing at sink level with need for hand over hand assist to manage faucet and lather soap between hands. Patient also required assist to grab paper towels to dry hands              Lower Body Dressing: Maximal assistance;Sitting/lateral leans;Sit to/from stand Lower Body Dressing Details (indicate cue type and reason): Max A to doff/don footwear seated EOB 2/2 BUE deficits. Patient notes prior to surgery it took him ~20 minutes to don footwear.  Toilet Transfer: Loss adjuster, chartered Details (indicate cue type and reason): Patient met seated on commode. Min guard for sit to stand from low commode. Patient unable to functionally use BUE to assist. Dependent on rocking technique to boost  up from sitting.   Toileting- Clothing Manipulation and Hygiene: Maximal assistance Toileting - Clothing Manipulation Details (indicate cue type and reason): Patient would likely require Max A for hygiene/clothing management.      Functional mobility during ADLs: Min guard (Hand held assist for safety)       Vision   Vision Assessment?: No apparent visual deficits      Perception     Praxis      Pertinent Vitals/Pain Pain Assessment: 0-10 Pain Score: 5  Pain Location: R shoulder and low back Pain Descriptors / Indicators: Dull;Sharp Pain Intervention(s): Monitored during session;Repositioned     Hand Dominance Right   Extremity/Trunk Assessment Upper Extremity Assessment Upper Extremity Assessment: RUE deficits/detail;LUE deficits/detail RUE Deficits / Details: Activation of scapular musculature. Weak shoulder protraction/retraction. Patient also able to make weak fist. Patient is unable to actively flex shoulder, elbow, or wrist.  RUE Sensation: decreased light touch RUE Coordination: decreased fine motor;decreased gross motor LUE Deficits / Details: Activation of scapular musculature. Weak shoulder protraction/retraction. Patient also able to make weak fist. Patient is unable to actively flex shoulder, elbow, or wrist.  LUE Sensation: decreased light touch LUE Coordination: decreased fine motor;decreased gross motor   Lower Extremity Assessment Lower Extremity Assessment: Defer to PT evaluation   Cervical / Trunk Assessment Cervical / Trunk Assessment: Other exceptions Cervical / Trunk Exceptions: s/p cervical surgery   Communication Communication Communication: No difficulties   Cognition Arousal/Alertness: Awake/alert Behavior During Therapy: WFL for tasks assessed/performed Overall Cognitive Status: Within Functional Limits for tasks assessed                                     General Comments       Exercises     Shoulder Instructions      Home Living Family/patient expects to be discharged to:: Private residence Living Arrangements: Alone Available Help at Discharge: Family;Friend(s);Available PRN/intermittently Type of Home: House Home Access: Stairs to enter [employer] of Steps: 5 Entrance Stairs-Rails: Right Home Layout: One level     Bathroom Shower/Tub: Engineer, site: Standard                Prior Functioning/Environment Level of Independence: Independent  Gait / Transfers Assistance Needed: Patient reports ambulating without use of AD. Questionable safety given patients deficits prior to and after surgery.  ADL's / Homemaking Assistance Needed: Patient repots being able to don clothing with more than reasonable amount of time noting 20 minutes to don footwear alone.             OT Problem List: Decreased strength;Decreased range of motion;Impaired balance (sitting and/or standing);Decreased coordination;Decreased safety awareness;Decreased knowledge of use of DME or AE;Decreased knowledge of precautions;Impaired sensation;Impaired UE functional use      OT Treatment/Interventions: Self-care/ADL training;Therapeutic exercise;Neuromuscular education;Energy conservation;DME and/or AE instruction;Splinting;Therapeutic activities;Patient/family education;Balance training    OT Goals(Current goals can be found in the care plan section) Acute Rehab OT Goals Patient Stated Goal: To be independent again OT Goal Formulation: With patient Time For Goal Achievement: 04/20/20 Potential to Achieve Goals: Fair ADL Goals Pt Will Perform Eating: with modified independence;with adaptive utensils Pt Will Perform Grooming: with modified independence;with adaptive equipment;standing;sitting Pt Will Perform Upper Body Dressing: sitting;with modified independence Pt Will Perform Lower Body Dressing: sitting/lateral leans;sit to/from stand;with modified independence Pt Will Transfer to Toilet: with modified independence;regular height toilet Pt Will Perform Toileting -  Clothing Manipulation and hygiene: with modified independence;with adaptive equipment;sitting/lateral leans;sit to/from stand Additional ADL Goal #1: Patient will recall and demonstrate adhrence to cervical precautions in prep for completion of ADLs.  OT Frequency: Min 2X/week   Barriers to  D/C: Inaccessible home environment;Decreased caregiver support  Patient lives alone       Co-evaluation              AM-PAC OT "6 Clicks" Daily Activity     Outcome Measure Help from another person eating meals?: A Lot Help from another person taking care of personal grooming?: A Lot Help from another person toileting, which includes using toliet, bedpan, or urinal?: A Lot Help from another person bathing (including washing, rinsing, drying)?: A Lot Help from another person to put on and taking off regular upper body clothing?: A Lot Help from another person to put on and taking off regular lower body clothing?: A Lot 6 Click Score: 12   End of Session Equipment Utilized During Treatment: Gait belt  Activity Tolerance: Patient tolerated treatment well Patient left: in bed;with call bell/phone within reach  OT Visit Diagnosis: Unsteadiness on feet (R26.81);Muscle weakness (generalized) (M62.81);Other symptoms and signs involving the nervous system (R29.898)                Time: 6285-4965 OT Time Calculation (min): 33 min Charges:  OT General Charges $OT Visit: 1 Visit OT Evaluation $OT Eval Moderate Complexity: 1 Mod OT Treatments $Therapeutic Activity: 8-22 mins  Rosalyn Archambault H. OTR/L Supplemental OT, Department of rehab services [phone]  Samer Dutton R H. 04/06/2020, 12:00 PM

## 2020-04-06 NOTE — Progress Notes (Signed)
Inpatient Rehab Admissions Coordinator Note:   Per therapy recommendations, pt was screened for CIR candidacy by Megan Salon, MS CCC-SLP. At this time, Pt. Appears to have functional decline; however, he does not demonstrate medical necessity for an inpatient stay. SNF may be a more appropriate disposition for Pt. If he is unable to return home safely.  Please contact me with questions.   Megan Salon, MS, CCC-SLP Rehab Admissions Coordinator  [phone] (celll) [phone] (office)

## 2020-04-07 ENCOUNTER — Other Ambulatory Visit: Payer: Self-pay

## 2020-04-07 ENCOUNTER — Inpatient Hospital Stay (HOSPITAL_COMMUNITY)
Admission: RE | Admit: 2020-04-07 | Discharge: 2020-04-19 | DRG: 559 | Disposition: A | Discharge status: Home Health | Payer: Medicare Other | Source: Intra-hospital | Attending: Physical Medicine and Rehabilitation | Admitting: Physical Medicine and Rehabilitation

## 2020-04-07 ENCOUNTER — Encounter (HOSPITAL_COMMUNITY): Payer: Self-pay | Admitting: Physical Medicine and Rehabilitation

## 2020-04-07 DIAGNOSIS — G8250 Quadriplegia, unspecified: Secondary | ICD-10-CM

## 2020-04-07 DIAGNOSIS — Z4789 Encounter for other orthopedic aftercare: Secondary | ICD-10-CM | POA: Diagnosis present

## 2020-04-07 DIAGNOSIS — G629 Polyneuropathy, unspecified: Secondary | ICD-10-CM | POA: Diagnosis present

## 2020-04-07 DIAGNOSIS — M19041 Primary osteoarthritis, right hand: Secondary | ICD-10-CM | POA: Diagnosis present

## 2020-04-07 DIAGNOSIS — X58XXXD Exposure to other specified factors, subsequent encounter: Secondary | ICD-10-CM | POA: Diagnosis present

## 2020-04-07 DIAGNOSIS — M19042 Primary osteoarthritis, left hand: Secondary | ICD-10-CM | POA: Diagnosis present

## 2020-04-07 DIAGNOSIS — Z8249 Family history of ischemic heart disease and other diseases of the circulatory system: Secondary | ICD-10-CM | POA: Diagnosis not present

## 2020-04-07 DIAGNOSIS — K5901 Slow transit constipation: Secondary | ICD-10-CM

## 2020-04-07 DIAGNOSIS — M792 Neuralgia and neuritis, unspecified: Secondary | ICD-10-CM

## 2020-04-07 DIAGNOSIS — M5412 Radiculopathy, cervical region: Secondary | ICD-10-CM | POA: Diagnosis present

## 2020-04-07 DIAGNOSIS — Z79899 Other long term (current) drug therapy: Secondary | ICD-10-CM

## 2020-04-07 DIAGNOSIS — G959 Disease of spinal cord, unspecified: Secondary | ICD-10-CM | POA: Diagnosis present

## 2020-04-07 DIAGNOSIS — M4722 Other spondylosis with radiculopathy, cervical region: Secondary | ICD-10-CM

## 2020-04-07 DIAGNOSIS — Z87891 Personal history of nicotine dependence: Secondary | ICD-10-CM | POA: Diagnosis not present

## 2020-04-07 DIAGNOSIS — G950 Syringomyelia and syringobulbia: Secondary | ICD-10-CM | POA: Diagnosis present

## 2020-04-07 DIAGNOSIS — Z811 Family history of alcohol abuse and dependence: Secondary | ICD-10-CM | POA: Diagnosis not present

## 2020-04-07 DIAGNOSIS — Z888 Allergy status to other drugs, medicaments and biological substances status: Secondary | ICD-10-CM

## 2020-04-07 DIAGNOSIS — G8918 Other acute postprocedural pain: Secondary | ICD-10-CM

## 2020-04-07 DIAGNOSIS — Z981 Arthrodesis status: Secondary | ICD-10-CM

## 2020-04-07 DIAGNOSIS — W06XXXA Fall from bed, initial encounter: Secondary | ICD-10-CM | POA: Diagnosis not present

## 2020-04-07 DIAGNOSIS — Z833 Family history of diabetes mellitus: Secondary | ICD-10-CM

## 2020-04-07 DIAGNOSIS — J45909 Unspecified asthma, uncomplicated: Secondary | ICD-10-CM | POA: Diagnosis present

## 2020-04-07 DIAGNOSIS — I10 Essential (primary) hypertension: Secondary | ICD-10-CM | POA: Diagnosis present

## 2020-04-07 DIAGNOSIS — M25511 Pain in right shoulder: Secondary | ICD-10-CM | POA: Diagnosis present

## 2020-04-07 DIAGNOSIS — Z880 Allergy status to penicillin: Secondary | ICD-10-CM

## 2020-04-07 DIAGNOSIS — G8929 Other chronic pain: Secondary | ICD-10-CM | POA: Diagnosis present

## 2020-04-07 DIAGNOSIS — G935 Compression of brain: Secondary | ICD-10-CM | POA: Diagnosis present

## 2020-04-07 DIAGNOSIS — D62 Acute posthemorrhagic anemia: Secondary | ICD-10-CM | POA: Diagnosis present

## 2020-04-07 DIAGNOSIS — M4712 Other spondylosis with myelopathy, cervical region: Principal | ICD-10-CM

## 2020-04-07 DIAGNOSIS — S14129D Central cord syndrome at unspecified level of cervical spinal cord, subsequent encounter: Secondary | ICD-10-CM

## 2020-04-07 MED ORDER — PANTOPRAZOLE SODIUM 40 MG PO TBEC
40.0000 mg | DELAYED_RELEASE_TABLET | Freq: Every day | ORAL | Status: DC
  Administered 2020-04-07 – 2020-04-18 (×12): 40 mg via ORAL
  Filled 2020-04-07 (×12): qty 1

## 2020-04-07 MED ORDER — ONDANSETRON HCL 4 MG/2ML IJ SOLN: 4.0000 mg | Freq: Four times a day (QID) | INTRAMUSCULAR | Status: DC | PRN

## 2020-04-07 MED ORDER — OXYCODONE HCL 5 MG PO TABS: 5.0000 mg | ORAL_TABLET | ORAL | Status: DC | PRN

## 2020-04-07 MED ORDER — ONDANSETRON HCL 4 MG PO TABS: 4.0000 mg | ORAL_TABLET | Freq: Four times a day (QID) | ORAL | Status: DC | PRN

## 2020-04-07 MED ORDER — AMLODIPINE BESYLATE 10 MG PO TABS
10.0000 mg | ORAL_TABLET | Freq: Every day | ORAL | Status: DC
  Administered 2020-04-08 – 2020-04-19 (×12): 10 mg via ORAL
  Filled 2020-04-07 (×13): qty 1

## 2020-04-07 MED ORDER — CYCLOBENZAPRINE HCL 10 MG PO TABS: 10.0000 mg | ORAL_TABLET | Freq: Three times a day (TID) | ORAL | Status: DC | PRN

## 2020-04-07 MED ORDER — OXYCODONE HCL 5 MG PO TABS: 10.0000 mg | ORAL_TABLET | ORAL | Status: DC | PRN

## 2020-04-07 MED ORDER — DOCUSATE SODIUM 100 MG PO CAPS
100.0000 mg | ORAL_CAPSULE | Freq: Two times a day (BID) | ORAL | Status: DC
  Administered 2020-04-07 – 2020-04-19 (×23): 100 mg via ORAL
  Filled 2020-04-07 (×25): qty 1

## 2020-04-07 NOTE — Progress Notes (Signed)
Patient discharged  from room 3C11 and transferred to unit [id number] at this time. Alert and in stable condition. Report given to receiving nurse Pamala Duffel, RN with all questions answered.Transfered via wheelchair with all belongings at side.

## 2020-04-07 NOTE — H&P (Signed)
Physical Medicine and Rehabilitation Admission H&P     HPI: Shannon [name] is a 74 year old right-handed male with history of hypertension, asthma as well as Chiari I malformation,syringomyelia.  History taken from chart review, patient, and daughter. Patient lives alone.  1 level home 5 steps to entry.  Reports ambulating without assistive device.  He presented on 04/05/20 with complaints of new left arm pain, numbness as well as chronic arm pain on the right with numbness tingling weakness as well as unsteady gait difficulty using his hands with decreased coordination.  Admission chemistries unremarkable except glucose of 120, hemoglobin 14.9.  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  Underwent C3-4 C4-5 anterior cervical discectomy decompression interbody arthrodesis on 04/05/20 per Dr. Newman Pies.  Hospital course further complicated by post operative pain. Cervical brace as directed.  Tolerating a regular diet.  Therapy evaluations completed and patient was admitted for a comprehensive rehab program. Prolonged discussion with patient and daughter regarding grievances and process for inpatient rehab. Please see preadmission assessment from earlier today as well.   Review of Systems  Constitutional: Negative for chills and fever.  HENT: Negative for hearing loss.   Eyes: Negative for blurred vision and double vision.  Respiratory: Negative for cough and shortness of breath.   Cardiovascular: Negative for chest pain and leg swelling.  Gastrointestinal: Positive for constipation. Negative for heartburn, nausea and vomiting.  Genitourinary: Negative for dysuria, flank pain and hematuria.  Musculoskeletal: Positive for myalgias and neck pain.  Skin: Negative for rash.  Neurological: Positive for sensory change, focal weakness and weakness. Negative for speech change.       Decreased coordination with gait instability   All other systems reviewed and are negative.  Past Medical History:  Diagnosis Date  . Asthma   . 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 Sherwood Gambler, 1992-->surg at [facility].  Summer 2021->Cervicalgia,arm pain, hand atrophy RUE wkness, hyperreflex (Dr. 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 Hay fever   . Hypertension   . Osteoarthritis of both hands    Past Surgical History:  Procedure Laterality Date  . BACK SURGERY    . INCISION AND DRAINAGE Left 2012   L hand infection  . ROTATOR CUFF REPAIR Right    x 3  . SPINE SURGERY     Family History  Problem Relation Age of Onset  . Heart attack Mother   . Heart disease Mother   . High blood pressure Mother   . Alcohol abuse Father   . Cancer Father   . Diabetes Father   . High blood pressure Father    Social History:  reports that he has quit smoking. He has never used smokeless tobacco. He reports previous alcohol use. He reports that he does not use drugs. Allergies:  Allergies  Allergen Reactions  . Iodine Swelling  . Penicillins Swelling    Facial swelling, itchy throat   Medications Prior to Admission  Medication Sig Dispense Refill  . amLODipine (NORVASC) 10 MG tablet Take 1 tablet (10 mg total) by mouth daily. 90 tablet 3    Drug Regimen Review  Drug regimen was reviewed and remains appropriate with no significant issues identified  Home: Home Living Family/patient expects to be discharged to:: Private residence Living Arrangements: Alone Available Help at Discharge: Family, Friend(s), Available PRN/intermittently Type of  Home: House Home Access: Stairs to enter [employer] of Steps: 5 Entrance Stairs-Rails: Right Home Layout: One level Bathroom Shower/Tub: Chiropodist: Standard Home Equipment: None   Functional History: Prior  Function Level of Independence: Independent Gait / Transfers Assistance Needed: Patient reports ambulating without use of AD. Questionable safety given patients deficits prior to and after surgery.  ADL's / Homemaking Assistance Needed: Patient reports being able to don clothing with increased time (noting 20 minutes to don footwear alone). 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 Pt with weakness in L UE that began around 7/21.  He went to ED thinking possible cardiac issues but determined just weakness, eventually saw neurosurgeon and now had surgery.  Reports L UE weakness was gradually worsening, but reports seems worse after this surgery.  Functional Status:  Mobility: Bed Mobility Overal bed mobility: Needs Assistance Bed Mobility: Rolling, Sidelying to Sit, Sit to Sidelying Rolling: Min assist Sidelying to sit: Min assist Sit to supine: Min assist Sit to sidelying: Min assist General bed mobility comments: Performed all above x 2; cues for safety and log roll technique Transfers Overall transfer level: Needs assistance Equipment used: 1 person hand held assist, Rolling walker (2 wheeled) Transfers: Sit to/from Stand Sit to Stand: Min guard, Min assist General transfer comment: Performed STS from elevated bed (min G) and from toilet (min A).  Cues for safety and increased time and effort.  Performed x 3 Ambulation/Gait Ambulation/Gait assistance: Min assist Gait Distance (Feet): 200 Feet Assistive device: Rolling walker (2 wheeled), 1 person hand held assist Gait Pattern/deviations: Step-through pattern, Narrow base of support, Scissoring General Gait Details: Pt with occasional scissoring and frequent narrow BOS.  He had 3 LOB requiring min A to recover.  Started with RW - required assist to place hands on walker, frequent cues for proximity, and how to use in turns and assistance to turn.  Then tried HHA as pt having  difficulty holding RW, provided HHA on L (to support shoulder), continued to need cues for safety Gait velocity: decreased    ADL: ADL Overall ADL's : Needs assistance/impaired Eating/Feeding: Maximal assistance Eating/Feeding Details (indicate cue type and reason): Hand over hand assist to bring cup to mouth 2/2 deficits.  Grooming: Maximal assistance Grooming Details (indicate cue type and reason): Patient required Max A to wash hands standing at sink level with need for hand over hand assist to manage faucet and lather soap between hands. Patient also required assist to grab paper towels to dry hands  Lower Body Dressing: Maximal assistance, Sitting/lateral leans, Sit to/from stand Lower Body Dressing Details (indicate cue type and reason): Max A to doff/don footwear seated EOB 2/2 BUE deficits. Patient notes prior to surgery it took him ~20 minutes to don footwear.  Toilet Transfer: Min guard, Regular Glass blower/designer Details (indicate cue type and reason): Patient met seated on commode. Min guard for sit to stand from low commode. Patient unable to functionally use BUE to assist. Dependent on rocking technique to boost up from sitting.   Toileting- Clothing Manipulation and Hygiene: Maximal assistance Toileting - Clothing Manipulation Details (indicate cue type and reason): Patient would likely require Max A for hygiene/clothing management.  Functional mobility during ADLs: Min guard (Hand held assist for safety)  Cognition: Cognition Overall Cognitive Status: Within Functional Limits for tasks assessed Orientation Level: Oriented X4 Cognition Arousal/Alertness: Awake/alert Behavior During Therapy: WFL for tasks assessed/performed Overall Cognitive  Status: Within Functional Limits for tasks assessed General Comments: Pt follows commands and is oriented.  He often required redirecting as would go on tangents.  But also demonstrates decreased safety awareness and awareness of  deficits.  Pt reports he was caught going to the bathroom independently earlier by staff and did not understand why it was a problem.  States "If you believe you can do it then you will be able to"  Physical Exam: Blood pressure 116/68, pulse 79, temperature 98.1 F (36.7 C), temperature source Oral, resp. rate 18, height _0  (1.778 m), weight 90.9 kg, SpO2 98 %. Physical Exam Vitals reviewed.  Constitutional:      General: He is not in acute distress.    Appearance: He is normal weight. He is not ill-appearing.  HENT:     Head: Normocephalic and atraumatic.     Right Ear: External ear normal.     Left Ear: External ear normal.     Nose: Nose normal.  Eyes:     General:        Right eye: No discharge.        Left eye: No discharge.     Extraocular Movements: Extraocular movements intact.  Neck:     Comments: +C- Collar in place Cardiovascular:     Rate and Rhythm: Normal rate and regular rhythm.  Pulmonary:     Effort: Pulmonary effort is normal. No respiratory distress.     Breath sounds: Normal breath sounds. No stridor.  Abdominal:     General: Abdomen is flat. Bowel sounds are normal. There is no distension.  Musculoskeletal:     Comments: UE edema  Skin:    General: Skin is warm and dry.     Comments: Neck incision with dressing CDI  Neurological:     Mental Status: He is alert.     Comments: Alert Oriented x3 Follows commands. Motor: RUE: shoulder abduction 2/5, elbow flex/ext 3+/5, hand grip 4+/5 LUE: Shoulder abduction: 3/5, elbow flex/ect 3-/5, hand grip 3/5 B/l LE: 4-4+/5 proximal to distal B/l UE ataxia  Psychiatric:        Speech: Speech normal.     Comments: Agitated about recent events    Results for orders placed or performed during the hospital encounter of 04/05/20 (from the past 48 hour(s))  ABO/Rh     Status: None   Collection Time: 04/05/20 10:30 AM  Result Value Ref Range   ABO/RH(D)      A POS Performed at [facility], [place]  [address] [place], [place] [address]    DG Cervical Spine 2-3 Views  Result Date: 04/05/2020 CLINICAL DATA:  Intraoperative localization images of the cervical spine EXAM: CERVICAL SPINE - 2-3 VIEW COMPARISON:  MRI 03/08/2020 FINDINGS: Three sequential cross-table lateral images of the cervical spine were obtained. 13:14 Radiograph: Demonstrates a pointed surgical implement at the anterior disc space C3-4 with 3 tractor anteriorly. Intubation. Electrode grounding lead projecting over the head. 14:41 Radiograph: Pointed surgical implement positioned at the C4-5 disc space. Surgical retractor anterior to these levels with screws positioned in the anterior cortices of the C4 and 5 vertebral bodies. Surgical material seen within the intervertebral disc space C3-4 as well. 15:58 Radiograph: Interval placement of interbody spacers at C3-4, C4-5 with anterior plate and screw fixation construct with bilateral securing screws at the C3-C5 vertebral bodies anteriorly. Anterior laparotomy sponge markers present. Intubated. Grounding lead remains. IMPRESSION: 1. Intraoperative localization and subsequent anterior cervical discectomy and fusion at  C3-5 as described above. 2. Final image depicts at least 2 small laparotomy sponge markers in the anterior soft tissue. Electronically Signed   By: Lovena Le M.D.   On: 04/05/2020 18:00       Medical Problem List and Plan: 1.  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  Antithrombotics: -DVT/anticoagulation:  SCDs  -antiplatelet therapy: N/A 3. Pain Management: Flexeril and oxycodone as needed  Monitor for post-op and neuropathic pain with increased exertion 4. Mood: Provide emotional support  -antipsychotic agents: N/A 5. Neuropsych: This  patient is capable of making decisions on his own behalf. 6. Skin/Wound Care: Routine skin checks 7. Fluids/Electrolytes/Nutrition: Routine in and outs. CMP ordered. 8.  Hypertension.  Norvasc 10 mg daily.    Monitor with increased mobility 9. Asthma  Monitor respiratory states with activity  Cathlyn Parsons, PA-C 04/07/2020  I have personally performed a face to face diagnostic evaluation, including, but not limited to relevant history and physical exam findings, of this patient and developed relevant assessment and plan.  Additionally, I have reviewed and concur with the physician assistant's documentation above.  Delice Lesch, MD, ABPMR

## 2020-04-07 NOTE — Discharge Summary (Signed)
Physician Discharge Summary  Patient ID: Shannon [name] MRN: [id number] DOB/AGE: 02/25/1946 74 y.o.  Admit date: 04/05/2020 Discharge date: 04/07/2020  Admission Diagnoses:Cervical spondylosis with myelopathy  Discharge Diagnoses: C3-4 and C4-5 disc degeneration, spondylosis, stenosis, cervical myelopathy, cervical radiculopathy, cervicalgia Active Problems:   Cervical spondylosis with myelopathy and radiculopathy   Discharged Condition: fair  Hospital Course: Mr. Giammarino was admitted and taken to the operating room for an uncomplicated cervical decompression and arthrodesis at C3/4, and 4/5. He was seen and evaluated by physical therapy and occupational therapy and felt to be a candidate for CIR. His wound is clean, dry, and without signs of infection. Speaking voice is hoarse, moving extremities, known weakness in the upper extremities, poor balance and coordination. This is his base line neurological examination.   Treatments: surgery: C3-4 and C4-5 anterior cervical discectomy/decompression; C3-4 and C4-5 interbody arthrodesis with local morcellized autograft bone and Zimmer DBM; insertion of interbody prosthesis at C3-4 and C4-5 (Zimmer peek interbody prosthesis); anterior cervical plating from C3-C5 with globus titanium plate  Discharge Exam: Blood pressure 116/68, pulse 79, temperature 98.1 F (36.7 C), temperature source Oral, resp. rate 18, height 5\' 10"  (1.778 m), weight 90.9 kg, SpO2 98 %. General appearance: alert, cooperative, appears stated age, fatigued and mild distress  Disposition: Discharge disposition: [facility]-Another [facility] Care Institution Not Defined      CERVICAL SPONDYLOSIS WITH MYELOPATHY AND RADICULOPATHY  Allergies as of 04/07/2020      Reactions   Iodine Swelling   Penicillins Swelling   Facial swelling, itchy throat      Medication List    TAKE these medications   amLODipine 10 MG tablet Commonly known as: NORVASC Take 1 tablet (10 mg total) by  mouth daily.       Follow-up Information    04/09/2020, MD. Call today.   Specialty: Neurosurgery Why: please call the office to make a follow up appointment Contact information: [address] [place] [phone]               Signed: [phone] 04/07/2020, 2:00 PM

## 2020-04-07 NOTE — PMR Pre-admission (Signed)
PMR Admission Coordinator Pre-Admission Assessment  Patient: Shannon [name] is an 74 y.o., male MRN: [id number] DOB: Oct 26, 1945 Height: 5\' 10"  (177.8 cm) Weight: 90.9 kg  Insurance Information HMO:    PPO:      PCP:      IPA:      80/20:      OTHER:  PRIMARY: Medicare A and B      Policy#: [id number]      Subscriber: patient CM Name:        Phone#:       Fax#:   Pre-Cert#:        Employer: Retired from post office Benefits:  Phone #:       Name:  Checked in Dentist and called secondary insurance to verify [place]. Date: A=03/25/11 and B=06/25/11     Deduct: $1484      Out of Pocket Max: None      Life Max: N/A CIR: 100%      SNF: 100 days Outpatient: 80%     Co-Pay: 20% Home Health: 100%      Co-Pay: none DME: 80%     Co-Pay: 20% Providers: patient's choice  SECONDARY: GEHA      Policy#: [id number] geha     Phone#: [phone]  Financial Counselor:        Phone#:    The "Data Collection Information Summary" for patients in Inpatient Rehabilitation Facilities with attached "Privacy Act [place] Records" was provided and verbally reviewed with: Patient and Family  Emergency Contact Information Contact Information    Name Relation Home Work Mobile   [name],Shannon Daughter   [phone]   Shannon [name] [phone]  [phone]   Shannon [name] Niece   [phone]      Current Medical History  Patient Admitting Diagnosis: Cervical myelopathy  History of Present Illness: a 74 year old right-handed male with history of hypertension, asthma as well as Chiari I malformation,syringomyelia.  Per chart review patient lives alone.  1 level home 5 steps to entry.  Reports ambulating without assistive device.  Presented 04/05/2020 with complaints of new left arm pain and numbness chronic arm pain on the right with numbness tingling weakness as well as unsteady gait difficulty using his hands with decreased coordination.  Admission chemistries unremarkable except glucose of 120,  hemoglobin 14.9.  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  Underwent C3-4 C4-5 anterior cervical discectomy decompression interbody arthrodesis 04/05/2020 per Dr. Newman Pies.  Cervical brace as directed.  Tolerating a regular diet.  Therapy evaluations completed and patient was admitted for a comprehensive rehab program.  Patient's medical record from [facility] has been reviewed by the rehabilitation admission coordinator and physician.  Past Medical History  Past Medical History:  Diagnosis Date  . Asthma   . 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 Sherwood Gambler, 1992-->surg at [facility].  Summer 2021->Cervicalgia,arm pain, hand atrophy RUE wkness, hyperreflex (Dr. 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 Hay fever   . Hypertension   . Osteoarthritis of both hands     Family History   family history includes Alcohol abuse in his father; Cancer in his father; Diabetes in his father; Heart attack in his mother; Heart disease in his mother; High blood pressure in his father and mother.  Prior Rehab/Hospitalizations Has the patient had prior rehab or hospitalizations prior to admission? No  Has  the patient had major surgery during 100 days prior to admission? Yes   Current Medications  Current Facility-Administered Medications:  .  acetaminophen (TYLENOL) tablet 650 mg, 650 mg, Oral, Q4H PRN **OR** acetaminophen (TYLENOL) suppository 650 mg, 650 mg, Rectal, Q4H PRN, Newman Pies, MD .  alum & mag hydroxide-simeth (MAALOX/MYLANTA) 200-200-20 MG/5ML suspension 30 mL, 30 mL, Oral, Q6H PRN, Newman Pies, MD .  amLODipine (NORVASC) tablet 10 mg, 10 mg, Oral, Daily, Newman Pies, MD, 10 mg at 04/07/20 1054 .  bisacodyl (DULCOLAX) suppository 10 mg, 10 mg, Rectal, Daily PRN,  Newman Pies, MD .  cyclobenzaprine (FLEXERIL) tablet 10 mg, 10 mg, Oral, TID PRN, Newman Pies, MD .  docusate sodium (COLACE) capsule 100 mg, 100 mg, Oral, BID, Newman Pies, MD, 100 mg at 04/07/20 1054 .  lactated ringers infusion, , Intravenous, Continuous, Newman Pies, MD .  menthol-cetylpyridinium (CEPACOL) lozenge 3 mg, 1 lozenge, Oral, PRN **OR** phenol (CHLORASEPTIC) mouth spray 1 spray, 1 spray, Mouth/Throat, PRN, Newman Pies, MD .  morphine 4 MG/ML injection 4 mg, 4 mg, Intravenous, Q2H PRN, Newman Pies, MD .  ondansetron [facility]) tablet 4 mg, 4 mg, Oral, Q6H PRN **OR** ondansetron (ZOFRAN) injection 4 mg, 4 mg, Intravenous, Q6H PRN, Newman Pies, MD .  oxyCODONE (Oxy IR/ROXICODONE) immediate release tablet 10 mg, 10 mg, Oral, Q3H PRN, Newman Pies, MD .  oxyCODONE (Oxy IR/ROXICODONE) immediate release tablet 5 mg, 5 mg, Oral, Q3H PRN, Newman Pies, MD .  pantoprazole (PROTONIX) EC tablet 40 mg, 40 mg, Oral, QHS, Newman Pies, MD, 40 mg at 04/06/20 2013  Patients Current Diet:  Diet Order            Diet regular Room service appropriate? Yes; Fluid consistency: Thin  Diet effective now                 Precautions / Restrictions Precautions Precautions: Cervical Precaution Booklet Issued: Yes (comment) Cervical Brace: Hard collar, Other (comment) Restrictions Weight Bearing Restrictions: No   Has the patient had 2 or more falls or a fall with injury in the past year? Yes  Prior Activity Level Community (5-7x/wk): Went out daily, was independent and driving.  Prior Functional Level Self Care: Did the patient need help bathing, dressing, using the toilet or eating? Independent  Indoor Mobility: Did the patient need assistance with walking from room to room (with or without device)? Independent  Stairs: Did the patient need assistance with internal or external stairs (with or without device)? Independent  Functional Cognition:  Did the patient need help planning regular tasks such as shopping or remembering to take medications? Independent  Home Assistive Devices / Equipment Home Assistive Devices/Equipment: None, Cane (specify quad or straight) Home Equipment: None  Prior Device Use: Indicate devices/aids used by the patient prior to current illness, exacerbation or injury? None of the above  Current Functional Level Cognition  Overall Cognitive Status: Impaired/Different from baseline Orientation Level: Oriented X4 Safety/Judgement: Decreased awareness of safety, Decreased awareness of deficits General Comments: Pt discussing his deficits as they were prior to surgery and does not seem to be fully understanding his current level of function.     Extremity Assessment (includes Sensation/Coordination)  Upper Extremity Assessment: Defer to OT evaluation (Defer to OT for details but demonstrating significant weaknesses in L UE (particularly shoulder and elbow); also R UE weakness but not as severe) RUE Deficits / Details: Activation of scapular musculature. Weak shoulder protraction/retraction. Patient also able to make weak fist. Patient is unable to  actively flex shoulder, elbow, or wrist.  RUE Sensation: decreased light touch RUE Coordination: decreased fine motor, decreased gross motor LUE Deficits / Details: Activation of scapular musculature. Weak shoulder protraction/retraction. Patient also able to make weak fist. Patient is unable to actively flex shoulder, elbow, or wrist.  LUE Sensation: decreased light touch LUE Coordination: decreased fine motor, decreased gross motor  Lower Extremity Assessment: LLE deficits/detail, RLE deficits/detail RLE Deficits / Details: ROM WFL; MMT 5/5 RLE Sensation: WNL LLE Deficits / Details: ROM WFL; MMT 5/5 LLE Sensation: WNL    ADLs  Overall ADL's : Needs assistance/impaired Eating/Feeding: Maximal assistance Eating/Feeding Details (indicate cue type and reason):  reports the foam over utensil handles has been very helpful for grasp however, rom to bring ue to mouth remains a great challenge Grooming: Maximal assistance Grooming Details (indicate cue type and reason): Patient required Max A to wash hands standing at sink level with need for hand over hand assist to manage faucet and lather soap between hands. Patient also required assist to grab paper towels to dry hands  Lower Body Dressing: Maximal assistance, Sitting/lateral leans, Sit to/from stand Lower Body Dressing Details (indicate cue type and reason): Max A to doff/don socks. reports he uses pliers at home to aide in pulling due to grasp issues Toilet Transfer: Min guard, Regular Glass blower/designer Details (indicate cue type and reason): Patient met seated on commode. Min guard for sit to stand from low commode. Patient unable to functionally use BUE to assist. Dependent on rocking technique to boost up from sitting.   Toileting- Clothing Manipulation and Hygiene: Maximal assistance Toileting - Clothing Manipulation Details (indicate cue type and reason): Patient would likely require Max A for hygiene/clothing management.  Functional mobility during ADLs: Min guard General ADL Comments: pt. is typically R hand dominant but now relies on use of L hand.  states he can not bring lue to mouth or head.  had to hold the phone for him when someone called him.  at home states he utilizes speaker function because he can not hold the phone.    Mobility  Overal bed mobility: Needs Assistance Bed Mobility: Rolling, Sidelying to Sit, Sit to Sidelying Rolling: Min assist Sidelying to sit: Min assist Sit to supine: Min assist Sit to sidelying: Min assist General bed mobility comments: sitting in chair and beginning and end of session    Transfers  Overall transfer level: Needs assistance Equipment used: 1 person hand held assist Transfers: Sit to/from Stand, Stand Pivot Transfers Sit to Stand: Min  guard, Min assist General transfer comment: cues to maintain precautions. pt. verbalizes understanding but had difficulty transitioniong from sit/stand. reviewed with him PT reported concerns with speed and gait, fall risk. he states it wasnt something with him it was that the socks were too big and moving around and he was over cautious about tripping. also states he has trouble with "what do you call that..oh yeah multi taksing". cued not to talk during ambulation or during tasks so he can focus    Ambulation / Gait / Stairs / Wheelchair Mobility  Ambulation/Gait Ambulation/Gait assistance: Herbalist (Feet): 200 Feet Assistive device: 1 person hand held assist, None Gait Pattern/deviations: Step-through pattern, Narrow base of support, Scissoring, Decreased step length - right General Gait Details: Pt with occasional scissoring and frequent narrow BOS. Pt declined RW use as he states it is hard to grip and maneuver. Forward momentum noted throughout gait training and quick gait, concerning for anterior  LOB. Occasional assist required throughout for unsteadiness and lateral LOB.  Gait velocity: decreased Gait velocity interpretation: 1.31 - 2.62 ft/sec, indicative of limited community ambulator Stairs: Yes Stairs assistance: Min assist Stair Management: One rail Right, Step to pattern, Forwards Number of Stairs: 6 General stair comments: Pt held to R railing with both hands - LUE supinated and holding to rail from underneath. Pt having difficulty gripping the rail for descent as he had to hold railing in a pronated position. negotiation appeared more unsteady in descent due to this.     Posture / Balance Balance Overall balance assessment: Needs assistance Sitting-balance support: No upper extremity supported Sitting balance-Leahy Scale: Fair Standing balance support: Single extremity supported, Bilateral upper extremity supported, No upper extremity supported Standing  balance-Leahy Scale: Poor Standing balance comment: Required at least single UE support or leaning on counter    Special needs/care consideration Wound Care: Neck dressing in place and C-collar in place and Designated visitor Daughter today   Previous Home Environment (from acute therapy documentation) Living Arrangements: Alone Available Help at Discharge: Family, Friend(s), Available PRN/intermittently Type of Home: House Home Layout: One level Home Access: Stairs to enter Entrance Stairs-Rails: Right Entrance Stairs-Number of Steps: 5 Bathroom Shower/Tub: Chiropodist: [place]: No  Discharge Living Setting Plans for Discharge Living Setting: Patient's home, Alone, House (Dtr plans to stay with patient after DC up to 21 days) Type of Home at Discharge: House Discharge Home Layout: One level, Able to live on main level with bedroom/bathroom Discharge Home Access: Stairs to enter Entrance Stairs-Rails: Right Entrance Stairs-Number of Steps: 5-6 Discharge Bathroom Shower/Tub: Tub/shower unit, Curtain Discharge Bathroom Toilet: Handicapped height Discharge Bathroom Accessibility: Yes How Accessible: Accessible via walker Does the patient have any problems obtaining your medications?: No  Social/Family/Support Systems Patient Roles: Parent (Has a daughter and friends.) Contact Information: Derec Mozingo - daughter - [phone] Anticipated Caregiver: Daughter Ability/Limitations of Caregiver: Dtr works but can take time off as needed up to a total of 21 days Caregiver Availability: 24/7 Discharge Plan Discussed with Primary Caregiver: Yes (Met with dtr and patient) Is Caregiver In Agreement with Plan?: Yes Does Caregiver/Family have Issues with Lodging/Transportation while Pt is in Rehab?: No  Goals Patient/Family Goal for Rehab: PT/OT mod I and S goals Expected length of stay: 5-7 days Cultural Considerations: None Pt/Family Agrees to  Admission and willing to participate: Yes Program Orientation Provided & Reviewed with Pt/Caregiver Including Roles  & Responsibilities: Yes  Decrease burden of Care through IP rehab admission: N/A  Possible need for SNF placement upon discharge: Not anticipated  Patient Condition: I have reviewed medical records from [facility], spoken with assigned RN on unit and CM, and patient and daughter. I met with patient at the bedside for inpatient rehabilitation assessment.  Patient will benefit from ongoing PT and OT, can actively participate in 3 hours of therapy a day 5 days of the week, and can make measurable gains during the admission.  Patient will also benefit from the coordinated team approach during an Inpatient Acute Rehabilitation admission.  The patient will receive intensive therapy as well as Rehabilitation physician, nursing, social worker, and care management interventions.  Due to bladder management, bowel management, safety, skin/wound care, disease management, medication administration, pain management and patient education the patient requires 24 hour a day rehabilitation nursing.  The patient is currently Min assist with mobility and max assist with basic ADLs.  Discharge setting and therapy post discharge at home  with home health is anticipated.  Patient has agreed to participate in the Acute Inpatient Rehabilitation Program and will admit today.  Preadmission Screen Completed By:  Retta Diones, 04/07/2020 12:19 PM ______________________________________________________________________   Discussed status with Dr. Posey Pronto and Dr. Ranell Patrick on 04/07/20 at 10:00 and received approval for admission today.  Admission Coordinator:  Retta Diones, RN, time 12:32/Date 04/07/20   Assessment/Plan: Diagnosis: Cervical myelopathy with quadraparesis  1. Does the need for close, 24 hr/day Medical supervision in concert with the patient's rehab needs make it unreasonable for this patient to be  served in a less intensive setting? Yes 2. Co-Morbidities requiring supervision/potential complications: HTN (monitor and provide prns in accordance with increased physical exertion and pain), asthma, Chiari I malformation, syringomyelia, post-op pain (Biofeedback training with therapies to help reduce reliance on opiate pain medications, monitor pain control during therapies, and sedation at rest and titrate to maximum efficacy to ensure participation and gains in therapies) 3. Due to bladder management, bowel management, safety, skin/wound care, disease management, pain management and patient education, does the patient require 24 hr/day rehab nursing? Yes 4. Does the patient require coordinated care of a physician, rehab nurse, PT, OT to address physical and functional deficits in the context of the above medical diagnosis(es)? Yes Addressing deficits in the following areas: balance, endurance, locomotion, strength, transferring, bathing, dressing, grooming, toileting and psychosocial support 5. Can the patient actively participate in an intensive therapy program of at least 3 hrs of therapy 5 days a week? Yes 6. The potential for patient to make measurable gains while on inpatient rehab is excellent 7. Anticipated functional outcomes upon discharge from inpatient rehab: modified independent and supervision PT, modified independent and supervision OT, n/a SLP 8. Estimated rehab length of stay to reach the above functional goals is: 12-16 days. 9. Anticipated discharge destination: Home 10. Overall Rehab/Functional Prognosis: excellent   MD Signature: Delice Lesch, MD, ABPMR

## 2020-04-07 NOTE — Progress Notes (Signed)
Patient arrived on unit, oriented to unit. Reviewed medications, therapy schedule, rehab routine and plan of care. States an understanding of information reviewed. No complications noted at this time. Patient reports no pain and is AX4 Shannon [name] [name]  

## 2020-04-07 NOTE — Progress Notes (Signed)
Physical Therapy Treatment Patient Details Name: Shannon [name] MRN: [id number] DOB: 02-Dec-1945 Today's Date: 04/07/2020    History of Present Illness Pt is 74 y.o. male presenting with progressive neck pain, quadraparesis, hand weakness, and unsteady gait. 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 Patient s/p anterior cervical discectomy/decompression, interbody arthrodesis, and plating on 10/13 by Dr. Lovell Sheehan. PMHx significatn for Chiari I malformation, syringomyelia, asthma, HNT, and OA.    PT Comments    Pt progressing with post-op mobility. Overall he continues to demonstrate a functional decline concerning for safe management of mobility without 24 hour support at home. He is understandably concerned about the risk of COVID transmission at a SNF, and would prefer CIR level rehab if at all possible. I do feel this pt would benefit from the increased intensity of multidisciplinary rehab available at [facility] as this functional decline has been since surgery. This session, he was able to demonstrate transfers and ambulation with gross min assist and no AD, as his decreased grip and UE function makes it difficult to manage a RW safely. Education was reinforced on precautions, brace application/wearing schedule, appropriate activity progression, and car transfer. Will continue to follow.      Follow Up Recommendations  CIR     Equipment Recommendations  None recommended by PT    Recommendations for Other Services Rehab consult     Precautions / Restrictions Precautions Precautions: Cervical Precaution Booklet Issued: Yes (comment) Required Braces or Orthoses: Cervical Brace Cervical Brace: Hard collar;Other (comment) (when OOB - applied in sitting) Restrictions Weight Bearing Restrictions: No    Mobility  Bed Mobility               General bed mobility comments: Pt was received sitting up EOB.   Transfers Overall transfer level: Needs assistance Equipment used: 1  person hand held assist Transfers: Sit to/from Stand Sit to Stand: Min guard;Min assist         General transfer comment: Hands on guarding/assist for power-up to full standing position. Initially requiring min assist and then progressed to min guard by end of session. Pt declining use of RW as he states he does better without it.   Ambulation/Gait Ambulation/Gait assistance: Min assist Gait Distance (Feet): 200 Feet Assistive device: 1 person hand held assist;None Gait Pattern/deviations: Step-through pattern;Narrow base of support;Scissoring;Decreased step length - right Gait velocity: decreased Gait velocity interpretation: 1.31 - 2.62 ft/sec, indicative of limited community ambulator General Gait Details: Pt with occasional scissoring and frequent narrow BOS. Pt declined RW use as he states it is hard to grip and maneuver. Forward momentum noted throughout gait training and quick gait, concerning for anterior LOB. Occasional assist required throughout for unsteadiness and lateral LOB.    Stairs Stairs: Yes Stairs assistance: Min assist Stair Management: One rail Right;Step to pattern;Forwards Number of Stairs: 6 General stair comments: Pt held to R railing with both hands - LUE supinated and holding to rail from underneath. Pt having difficulty gripping the rail for descent as he had to hold railing in a pronated position. negotiation appeared more unsteady in descent due to this.    Wheelchair Mobility    Modified Rankin (Stroke Patients Only)       Balance Overall balance assessment: Needs assistance Sitting-balance support: No upper extremity supported Sitting balance-Leahy Scale: Fair     Standing balance support: Single extremity supported;Bilateral upper extremity supported;No upper extremity supported Standing balance-Leahy Scale: Poor Standing balance comment: Required at least single UE support or  leaning on counter                             Cognition Arousal/Alertness: Awake/alert Behavior During Therapy: WFL for tasks assessed/performed Overall Cognitive Status: Impaired/Different from baseline Area of Impairment: Safety/judgement                         Safety/Judgement: Decreased awareness of safety;Decreased awareness of deficits     General Comments: Pt discussing his deficits as they were prior to surgery and does not seem to be fully understanding his current level of function.       Exercises      General Comments        Pertinent Vitals/Pain Pain Assessment: Faces Faces Pain Scale: Hurts little more Pain Location: Neck and R UE Pain Descriptors / Indicators: Dull;Aching Pain Intervention(s): Limited activity within patient's tolerance;Monitored during session;Repositioned    Home Living                      Prior Function            PT Goals (current goals can now be found in the care plan section) Acute Rehab PT Goals Patient Stated Goal: Avoid a SNF placement due to covid concerns in SNF at this time.  PT Goal Formulation: With patient/family Time For Goal Achievement: 04/20/20 Potential to Achieve Goals: Good Progress towards PT goals: Progressing toward goals    Frequency    Min 5X/week      PT Plan Current plan remains appropriate    Co-evaluation              AM-PAC PT "6 Clicks" Mobility   Outcome Measure  Help needed turning from your back to your side while in a flat bed without using bedrails?: A Little Help needed moving from lying on your back to sitting on the side of a flat bed without using bedrails?: A Little Help needed moving to and from a bed to a chair (including a wheelchair)?: A Little Help needed standing up from a chair using your arms (e.g., wheelchair or bedside chair)?: A Little Help needed to walk in hospital room?: A Little Help needed climbing 3-5 steps with a railing? : A Little 6 Click Score: 18    End of Session Equipment  Utilized During Treatment: Gait belt Activity Tolerance: Patient tolerated treatment well Patient left: in bed;with call bell/phone within reach;with bed alarm set;with family/visitor present Nurse Communication: Mobility status PT Visit Diagnosis: Unsteadiness on feet (R26.81);Muscle weakness (generalized) (M62.81)     Time: 2694-8546 PT Time Calculation (min) (ACUTE ONLY): 24 min  Charges:  $Gait Training: 23-37 mins                     Conni Slipper, PT, DPT Acute Rehabilitation Services Pager: [phone] Office: [phone]    Marylynn Pearson 04/07/2020, 10:17 AM

## 2020-04-07 NOTE — Progress Notes (Signed)
Inpatient Rehabilitation Medication Review by a Pharmacist  A complete drug regimen review was completed for this patient to identify any potential clinically significant medication issues.  Clinically significant medication issues were identified:  no  Check AMION for pharmacist assigned to patient if future medication questions/issues arise during this admission.  Pharmacist comments:   Time spent performing this drug regimen review (minutes):  5   Shannon [name] 04/07/2020 7:46 PM

## 2020-04-07 NOTE — H&P (Signed)
Physical Medicine and Rehabilitation Admission H&P     HPI: Shannon [name] is a 74 year old right-handed male with history of hypertension, asthma as well as Chiari I malformation,syringomyelia.  History taken from chart review, patient, and daughter. Patient lives alone.  1 level home 5 steps to entry.  Reports ambulating without assistive device.  He presented on 04/05/20 with complaints of new left arm pain, numbness as well as chronic arm pain on the right with numbness tingling weakness as well as unsteady gait difficulty using his hands with decreased coordination.  Admission chemistries unremarkable except glucose of 120, hemoglobin 14.9.  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  Underwent C3-4 C4-5 anterior cervical discectomy decompression interbody arthrodesis on 04/05/20 per Dr. Newman Pies.  Hospital course further complicated by post operative pain. Cervical brace as directed.  Tolerating a regular diet.  Therapy evaluations completed and patient was admitted for a comprehensive rehab program. Prolonged discussion with patient and daughter regarding grievances and process for inpatient rehab. Please see preadmission assessment from earlier today as well.   Review of Systems  Constitutional: Negative for chills and fever.  HENT: Negative for hearing loss.   Eyes: Negative for blurred vision and double vision.  Respiratory: Negative for cough and shortness of breath.   Cardiovascular: Negative for chest pain and leg swelling.  Gastrointestinal: Positive for constipation. Negative for heartburn, nausea and vomiting.  Genitourinary: Negative for dysuria, flank pain and hematuria.  Musculoskeletal: Positive for myalgias and neck pain.  Skin: Negative for rash.  Neurological: Positive for sensory change, focal weakness and weakness. Negative for speech change.       Decreased coordination with gait instability   All other systems reviewed and are negative.  Past Medical History:  Diagnosis Date  . Asthma   . 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 Sherwood Gambler, 1992-->surg at [facility].  Summer 2021->Cervicalgia,arm pain, hand atrophy RUE wkness, hyperreflex (Dr. 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 Hay fever   . Hypertension   . Osteoarthritis of both hands    Past Surgical History:  Procedure Laterality Date  . BACK SURGERY    . INCISION AND DRAINAGE Left 2012   L hand infection  . ROTATOR CUFF REPAIR Right    x 3  . SPINE SURGERY     Family History  Problem Relation Age of Onset  . Heart attack Mother   . Heart disease Mother   . High blood pressure Mother   . Alcohol abuse Father   . Cancer Father   . Diabetes Father   . High blood pressure Father    Social History:  reports that he has quit smoking. He has never used smokeless tobacco. He reports previous alcohol use. He reports that he does not use drugs. Allergies:  Allergies  Allergen Reactions  . Iodine Swelling  . Penicillins Swelling    Facial swelling, itchy throat   Medications Prior to Admission  Medication Sig Dispense Refill  . amLODipine (NORVASC) 10 MG tablet Take 1 tablet (10 mg total) by mouth daily. 90 tablet 3    Drug Regimen Review  Drug regimen was reviewed and remains appropriate with no significant issues identified  Home: Home Living Family/patient expects to be discharged to:: Private residence Living Arrangements: Alone Available Help at Discharge: Family, Friend(s), Available PRN/intermittently Type of  Home: House Home Access: Stairs to enter [employer] of Steps: 5 Entrance Stairs-Rails: Right Home Layout: One level Bathroom Shower/Tub: Chiropodist: Standard Home Equipment: None   Functional History: Prior  Function Level of Independence: Independent Gait / Transfers Assistance Needed: Patient reports ambulating without use of AD. Questionable safety given patients deficits prior to and after surgery.  ADL's / Homemaking Assistance Needed: Patient reports being able to don clothing with increased time (noting 20 minutes to don footwear alone). 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 Pt with weakness in L UE that began around 7/21.  He went to ED thinking possible cardiac issues but determined just weakness, eventually saw neurosurgeon and now had surgery.  Reports L UE weakness was gradually worsening, but reports seems worse after this surgery.  Functional Status:  Mobility: Bed Mobility Overal bed mobility: Needs Assistance Bed Mobility: Rolling, Sidelying to Sit, Sit to Sidelying Rolling: Min assist Sidelying to sit: Min assist Sit to supine: Min assist Sit to sidelying: Min assist General bed mobility comments: Performed all above x 2; cues for safety and log roll technique Transfers Overall transfer level: Needs assistance Equipment used: 1 person hand held assist, Rolling walker (2 wheeled) Transfers: Sit to/from Stand Sit to Stand: Min guard, Min assist General transfer comment: Performed STS from elevated bed (min G) and from toilet (min A).  Cues for safety and increased time and effort.  Performed x 3 Ambulation/Gait Ambulation/Gait assistance: Min assist Gait Distance (Feet): 200 Feet Assistive device: Rolling walker (2 wheeled), 1 person hand held assist Gait Pattern/deviations: Step-through pattern, Narrow base of support, Scissoring General Gait Details: Pt with occasional scissoring and frequent narrow BOS.  He had 3 LOB requiring min A to recover.  Started with RW - required assist to place hands on walker, frequent cues for proximity, and how to use in turns and assistance to turn.  Then tried HHA as pt having  difficulty holding RW, provided HHA on L (to support shoulder), continued to need cues for safety Gait velocity: decreased    ADL: ADL Overall ADL's : Needs assistance/impaired Eating/Feeding: Maximal assistance Eating/Feeding Details (indicate cue type and reason): Hand over hand assist to bring cup to mouth 2/2 deficits.  Grooming: Maximal assistance Grooming Details (indicate cue type and reason): Patient required Max A to wash hands standing at sink level with need for hand over hand assist to manage faucet and lather soap between hands. Patient also required assist to grab paper towels to dry hands  Lower Body Dressing: Maximal assistance, Sitting/lateral leans, Sit to/from stand Lower Body Dressing Details (indicate cue type and reason): Max A to doff/don footwear seated EOB 2/2 BUE deficits. Patient notes prior to surgery it took him ~20 minutes to don footwear.  Toilet Transfer: Min guard, Regular Glass blower/designer Details (indicate cue type and reason): Patient met seated on commode. Min guard for sit to stand from low commode. Patient unable to functionally use BUE to assist. Dependent on rocking technique to boost up from sitting.   Toileting- Clothing Manipulation and Hygiene: Maximal assistance Toileting - Clothing Manipulation Details (indicate cue type and reason): Patient would likely require Max A for hygiene/clothing management.  Functional mobility during ADLs: Min guard (Hand held assist for safety)  Cognition: Cognition Overall Cognitive Status: Within Functional Limits for tasks assessed Orientation Level: Oriented X4 Cognition Arousal/Alertness: Awake/alert Behavior During Therapy: WFL for tasks assessed/performed Overall Cognitive  Status: Within Functional Limits for tasks assessed General Comments: Pt follows commands and is oriented.  He often required redirecting as would go on tangents.  But also demonstrates decreased safety awareness and awareness of  deficits.  Pt reports he was caught going to the bathroom independently earlier by staff and did not understand why it was a problem.  States "If you believe you can do it then you will be able to"  Physical Exam: Blood pressure 116/68, pulse 79, temperature 98.1 F (36.7 C), temperature source Oral, resp. rate 18, height _0  (1.778 m), weight 90.9 kg, SpO2 98 %. Physical Exam Vitals reviewed.  Constitutional:      General: He is not in acute distress.    Appearance: He is normal weight. He is not ill-appearing.  HENT:     Head: Normocephalic and atraumatic.     Right Ear: External ear normal.     Left Ear: External ear normal.     Nose: Nose normal.  Eyes:     General:        Right eye: No discharge.        Left eye: No discharge.     Extraocular Movements: Extraocular movements intact.  Neck:     Comments: +C- Collar in place Cardiovascular:     Rate and Rhythm: Normal rate and regular rhythm.  Pulmonary:     Effort: Pulmonary effort is normal. No respiratory distress.     Breath sounds: Normal breath sounds. No stridor.  Abdominal:     General: Abdomen is flat. Bowel sounds are normal. There is no distension.  Musculoskeletal:     Comments: UE edema  Skin:    General: Skin is warm and dry.     Comments: Neck incision with dressing CDI  Neurological:     Mental Status: He is alert.     Comments: Alert Oriented x3 Follows commands. Motor: RUE: shoulder abduction 2/5, elbow flex/ext 3+/5, hand grip 4+/5 LUE: Shoulder abduction: 3/5, elbow flex/ect 3-/5, hand grip 3/5 B/l LE: 4-4+/5 proximal to distal B/l UE ataxia  Psychiatric:        Speech: Speech normal.     Comments: Agitated about recent events    Results for orders placed or performed during the hospital encounter of 04/05/20 (from the past 48 hour(s))  ABO/Rh     Status: None   Collection Time: 04/05/20 10:30 AM  Result Value Ref Range   ABO/RH(D)      A POS Performed at [facility], [place]  [address] [place], [place] [address]    DG Cervical Spine 2-3 Views  Result Date: 04/05/2020 CLINICAL DATA:  Intraoperative localization images of the cervical spine EXAM: CERVICAL SPINE - 2-3 VIEW COMPARISON:  MRI 03/08/2020 FINDINGS: Three sequential cross-table lateral images of the cervical spine were obtained. 13:14 Radiograph: Demonstrates a pointed surgical implement at the anterior disc space C3-4 with 3 tractor anteriorly. Intubation. Electrode grounding lead projecting over the head. 14:41 Radiograph: Pointed surgical implement positioned at the C4-5 disc space. Surgical retractor anterior to these levels with screws positioned in the anterior cortices of the C4 and 5 vertebral bodies. Surgical material seen within the intervertebral disc space C3-4 as well. 15:58 Radiograph: Interval placement of interbody spacers at C3-4, C4-5 with anterior plate and screw fixation construct with bilateral securing screws at the C3-C5 vertebral bodies anteriorly. Anterior laparotomy sponge markers present. Intubated. Grounding lead remains. IMPRESSION: 1. Intraoperative localization and subsequent anterior cervical discectomy and fusion at  C3-5 as described above. 2. Final image depicts at least 2 small laparotomy sponge markers in the anterior soft tissue. Electronically Signed   By: Lovena Le M.D.   On: 04/05/2020 18:00       Medical Problem List and Plan: 1.  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  Antithrombotics: -DVT/anticoagulation:  SCDs  -antiplatelet therapy: N/A 3. Pain Management: Flexeril and oxycodone as needed  Monitor for post-op and neuropathic pain with increased exertion 4. Mood: Provide emotional support  -antipsychotic agents: N/A 5. Neuropsych: This  patient is capable of making decisions on his own behalf. 6. Skin/Wound Care: Routine skin checks 7. Fluids/Electrolytes/Nutrition: Routine in and outs. CMP ordered. 8.  Hypertension.  Norvasc 10 mg daily.    Monitor with increased mobility 9. Asthma  Monitor respiratory states with activity  Cathlyn Parsons, PA-C 04/07/2020  I have personally performed a face to face diagnostic evaluation, including, but not limited to relevant history and physical exam findings, of this patient and developed relevant assessment and plan.  Additionally, I have reviewed and concur with the physician assistant's documentation above.  Delice Lesch, MD, ABPMR  The patient's status has not changed. Any changes from the pre-admission screening or documentation from the acute chart are noted above.   Delice Lesch, MD, ABPMR

## 2020-04-07 NOTE — Progress Notes (Signed)
Occupational Therapy Treatment Patient Details Name: Shannon [name] MRN: [id number] DOB: 05-16-1946 Today's Date: 04/07/2020    History of present illness Pt is 74 y.o. male presenting with progressive neck pain, quadraparesis, hand weakness, and unsteady gait. 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 Patient s/p anterior cervical discectomy/decompression, interbody arthrodesis, and plating on 10/13 by Dr. Lovell Sheehan. PMHx significatn for Chiari I malformation, syringomyelia, asthma, HNT, and OA.   OT comments  Pt. Seen for skilled OT treatment session.  Requires max a for all grooming and adl tasks secondary to limited grasp and rom for functional use of BUES.  Remains an excellent candidate for CIR level therapies to continue with strengthening and integration of modifications for increasing safety and independence with adls and functional mobilty.   Follow Up Recommendations  CIR    Equipment Recommendations       Recommendations for Other Services Rehab consult    Precautions / Restrictions Precautions Precautions: Cervical Precaution Booklet Issued: Yes (comment) Required Braces or Orthoses: Cervical Brace Cervical Brace: Hard collar;Other (comment) Restrictions Weight Bearing Restrictions: No       Mobility Bed Mobility               General bed mobility comments: sitting in chair and beginning and end of session  Transfers Overall transfer level: Needs assistance Equipment used: 1 person hand held assist Transfers: Sit to/from Stand;Stand Pivot Transfers Sit to Stand: Min guard;Min assist         General transfer comment: cues to maintain precautions. pt. verbalizes understanding but had difficulty transitioniong from sit/stand. reviewed with him PT reported concerns with speed and gait, fall risk. he states it wasnt something with him it was that the socks were too big and moving around and he was over cautious about tripping. also states he has trouble  with "what do you call that..oh yeah multi taksing". cued not to talk during ambulation or during tasks so he can focus    Balance Overall balance assessment: Needs assistance Sitting-balance support: No upper extremity supported Sitting balance-Leahy Scale: Fair     Standing balance support: Single extremity supported;Bilateral upper extremity supported;No upper extremity supported Standing balance-Leahy Scale: Poor Standing balance comment: Required at least single UE support or leaning on counter                           ADL either performed or assessed with clinical judgement   ADL Overall ADL's : Needs assistance/impaired   Eating/Feeding Details (indicate cue type and reason): reports the foam over utensil handles has been very helpful for grasp however, rom to bring ue to mouth remains a great challenge                   Lower Body Dressing Details (indicate cue type and reason): Max A to doff/don socks. reports he uses pliers at home to aide in pulling due to grasp issues             Functional mobility during ADLs: Min guard General ADL Comments: pt. is typically R hand dominant but now relies on use of L hand.  states he can not bring lue to mouth or head.  had to hold the phone for him when someone called him.  at home states he utilizes speaker function because he can not hold the phone.     Vision       Perception     Praxis  Cognition Arousal/Alertness: Awake/alert Behavior During Therapy: WFL for tasks assessed/performed Overall Cognitive Status: Impaired/Different from baseline Area of Impairment: Safety/judgement                         Safety/Judgement: Decreased awareness of safety;Decreased awareness of deficits     General Comments: Pt discussing his deficits as they were prior to surgery and does not seem to be fully understanding his current level of function.         Exercises     Shoulder Instructions        General Comments      Pertinent Vitals/ Pain       Pain Assessment: Faces Faces Pain Scale: Hurts a little bit Pain Location: Neck and R UE Pain Descriptors / Indicators: Dull;Aching Pain Intervention(s): Limited activity within patient's tolerance;Monitored during session;Repositioned  Home Living                                          Prior Functioning/Environment              Frequency  Min 2X/week        Progress Toward Goals  OT Goals(current goals can now be found in the care plan section)  Progress towards OT goals: Progressing toward goals  Acute Rehab OT Goals Patient Stated Goal: Avoid a SNF placement due to covid concerns in SNF at this time.   Plan      Co-evaluation                 AM-PAC OT "6 Clicks" Daily Activity     Outcome Measure   Help from another person eating meals?: A Lot Help from another person taking care of personal grooming?: A Lot Help from another person toileting, which includes using toliet, bedpan, or urinal?: A Lot Help from another person bathing (including washing, rinsing, drying)?: A Lot Help from another person to put on and taking off regular upper body clothing?: A Lot Help from another person to put on and taking off regular lower body clothing?: A Lot 6 Click Score: 12    End of Session Equipment Utilized During Treatment: Gait belt;Cervical collar  OT Visit Diagnosis: Unsteadiness on feet (R26.81);Muscle weakness (generalized) (M62.81);Other symptoms and signs involving the nervous system (R29.898)   Activity Tolerance Patient tolerated treatment well   Patient Left in chair;with call bell/phone within reach   Nurse Communication          Time: 9233-0076 OT Time Calculation (min): 15 min  Charges: OT General Charges $OT Visit: 1 Visit OT Treatments $Self Care/Home Management : 8-22 mins  Boneta Lucks, COTA/L Acute Rehabilitation [phone]   Robet Leu 04/07/2020, 12:22 PM

## 2020-04-07 NOTE — Discharge Instructions (Addendum)

## 2020-04-08 ENCOUNTER — Inpatient Hospital Stay (HOSPITAL_COMMUNITY): Payer: Medicare Other | Admitting: Occupational Therapy

## 2020-04-08 ENCOUNTER — Inpatient Hospital Stay (HOSPITAL_COMMUNITY): Payer: Medicare Other | Admitting: Physical Therapy

## 2020-04-08 DIAGNOSIS — G959 Disease of spinal cord, unspecified: Secondary | ICD-10-CM

## 2020-04-08 NOTE — Progress Notes (Signed)
Occupational Therapy Session Note  Patient Details  Name: Shannon [name] MRN: [id number] Date of Birth: 11-Oct-1945  Today's Date: 04/08/2020 OT Individual Time: 6712-4580 OT Individual Time Calculation (min): 60 min    Short Term Goals: Week 1:  OT Short Term Goal 1 (Week 1): STGs = LTGs  Skilled Therapeutic Interventions/Progress Updates:   Pt seen for 2nd session today to problem solve how to facilitate self feeding as pt has very limited proximal movement of L shoulder, limited dexterity of L hand.  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  Obtained a mobile arm support (MAS) and set up in pt's room and place chair in front of it.  Pt transferred from bed to chair with min A and therapist adjusted his L arm in MAS.  Pt worked on a/arom using MAS for horizontal abd/add and elb flex/ext.  Needed to use 7 weights to enable pt to lift arm enough.  Pt had not eaten his lunch as he stated he was not hungry but he was willing to try some food to practice self feeding with use of the support.   He was able to grasp his french fries and sandwich to bring to mouth with extra effort. Eating the pudding was very challenging even with the built up spoon due to limited dexterity and wrist control.  At this time a curved spoon was not available but was able to adapt and extend spoon to angle to the right and added foam handle with coban for increased grip traction.  Pt needs A to scoop food onto spoon but then he can bring to his mouth.    Demonstrated this to his NT and informed his RN.  Encouraged pt to practice this at every meal.  Pt returned to sitting EOB and talking with NT at end of the session.   Therapy Documentation Precautions:  Precautions Precautions: Cervical Precaution Booklet Issued: Yes (comment) Required Braces or Orthoses: Cervical Brace Cervical Brace: Hard collar, Other (comment) Restrictions Weight Bearing Restrictions: No   Pain: Pain  Assessment Pain Scale: 0-10 (reports baseline R shoulder pain that is present 24/7 from prior RTC repair) Pain Score: 0-No pain Pain Type: Surgical pain ADL: ADL Eating: Dependent Grooming: Dependent Upper Body Bathing: Moderate assistance Where Assessed-Upper Body Bathing: Edge of bed Lower Body Bathing: Moderate assistance Where Assessed-Lower Body Bathing: Edge of bed Upper Body Dressing: Moderate assistance Where Assessed-Upper Body Dressing: Edge of bed Lower Body Dressing: Maximal assistance Where Assessed-Lower Body Dressing: Edge of bed Toileting: Maximal assistance Where Assessed-Toileting: Teacher, adult education: Curator Method: Proofreader: Raised toilet seat   Therapy/Group: Individual Therapy  Ronold Hardgrove 04/08/2020, 1:12 PM

## 2020-04-08 NOTE — Evaluation (Signed)
Occupational Therapy Assessment and Plan  Patient Details  Name: Shannon [name] MRN: [id number] Date of Birth: 1945/07/09  OT Diagnosis: abnormal posture, muscular wasting and disuse atrophy, muscle weakness (generalized) and partial paralysis of LUE Rehab Potential: Rehab Potential (ACUTE ONLY): Good ELOS: 7-9 days   Today's Date: 04/08/2020 OT Individual Time: 0623-7628 OT Individual Time Calculation (min): 60 min     Hospital Problem: Principal Problem:   Cervical myelopathy ([place])   Past Medical History:  Past Medical History:  Diagnosis Date  . Asthma   . 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 Sherwood Gambler, 1992-->surg at [facility].  Summer 2021->Cervicalgia,arm pain, hand atrophy RUE wkness, hyperreflex (Dr. 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 Hay fever   . Hypertension   . Osteoarthritis of both hands    Past Surgical History:  Past Surgical History:  Procedure Laterality Date  . ANTERIOR CERVICAL DECOMP/DISCECTOMY FUSION N/A 04/05/2020   Procedure: ANTERIOR CERVICAL DECOMPRESSION/DISCECTOMY FUSION, INTERBODY PROSTHESIS, PLATE/SCREWS CERVICAL THREE-CERVICAL FOUR, CERVICAL FOUR- CERVICAL FIVE;  Surgeon: Newman Pies, MD;  Location: [facility];  Service: Neurosurgery;  Laterality: N/A;  . BACK SURGERY    . INCISION AND DRAINAGE Left 2012   L hand infection  . ROTATOR CUFF REPAIR Right    x 3  . SPINE SURGERY      Assessment & Plan Clinical Impression: Shannon [name] is a 74 year old right-handed male with history of hypertension, asthma as well as Chiari I malformation,syringomyelia.  History taken from chart review, patient, and daughter. Patient lives alone.  1 level home 5 steps to entry.  Reports ambulating without assistive device.  He presented on 04/05/20 with complaints of new left arm pain, numbness as well as chronic arm  pain on the right with numbness tingling weakness as well as unsteady gait difficulty using his hands with decreased coordination.  Admission chemistries unremarkable except glucose of 120, hemoglobin 14.9.  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  Underwent C3-4 C4-5 anterior cervical discectomy decompression interbody arthrodesis on 04/05/20 per Dr. Newman Pies.  Hospital course further complicated by post operative pain. Cervical brace as directed.  Tolerating a regular diet.  Therapy evaluations completed and patient was admitted for a comprehensive rehab program. Prolonged discussion with patient and daughter regarding grievances and process for inpatient rehab. Please see preadmission assessment from earlier today as well.    Patient transferred to CIR on 04/07/2020 .    Patient currently requires max with basic self-care skills secondary to muscle weakness and muscle paralysis, decreased cardiorespiratoy endurance, decreased coordination and decreased sitting balance, decreased standing balance, decreased postural control and decreased balance strategies.  Prior to hospitalization, patient could complete self care with modified independent .  Patient will benefit from skilled intervention to increase independence with basic self-care skills prior to discharge home with care partner.  Anticipate patient will require moderate physical assistance with his self care and S with his mobility and follow up home health.  OT - End of Session Activity Tolerance: Tolerates 10 - 20 min activity with multiple rests Endurance Deficit: Yes OT Assessment Rehab Potential (ACUTE ONLY): Good OT Barriers to Discharge: Decreased caregiver support OT Patient demonstrates impairments in the following area(s): Balance;Endurance;Motor OT Basic ADL's Functional Problem(s): Eating;Grooming;Bathing;Dressing;Toileting OT Transfers Functional  Problem(s): Toilet OT Additional Impairment(s): Fuctional Use of Upper Extremity OT Plan OT Intensity:  Minimum of 1-2 x/day, 45 to 90 minutes OT Frequency: 5 out of 7 days OT Duration/Estimated Length of Stay: 7-9 days OT Treatment/Interventions: Balance/vestibular training;Discharge planning;Functional mobility training;DME/adaptive equipment instruction;Neuromuscular re-education;Patient/family education;Psychosocial support;Therapeutic Activities;Self Care/advanced ADL retraining;Therapeutic Exercise;UE/LE Strength taining/ROM;UE/LE Coordination activities OT Self Feeding Anticipated Outcome(s): mod A OT Basic Self-Care Anticipated Outcome(s): mod A OT Toileting Anticipated Outcome(s): min A OT Bathroom Transfers Anticipated Outcome(s): supervision to toilet OT Recommendation Patient destination: Home Follow Up Recommendations: Home health OT Equipment Recommended: Tub/shower seat;3 in 1 bedside comode   OT Evaluation Precautions/Restrictions  Precautions Precautions: Cervical Precaution Booklet Issued: Yes (comment) Required Braces or Orthoses: Cervical Brace Cervical Brace: Hard collar;Other (comment) Restrictions Weight Bearing Restrictions: No  Pain Pain Assessment Pain Scale: 0-10 (reports baseline R shoulder pain that is present 24/7 from prior RTC repair) Pain Score: 0-No pain Pain Type: Surgical pain Home Living/Prior Functioning Home Living Family/patient expects to be discharged to:: Private residence Living Arrangements: Alone Available Help at Discharge: Family, Friend(s), Available PRN/intermittently Type of Home: House Home Access: Stairs to enter Technical brewer of Steps: 5-6 Entrance Stairs-Rails: Right Home Layout: One level Bathroom Shower/Tub: Tub/shower unit, Architectural technologist: Standard  Lives With: Alone Prior Function Level of Independence: Independent with gait, Independent with transfers, Independent with homemaking with ambulation  (states "I didn't look steady but I did it")  Able to Take Stairs?: Yes Driving: Yes Vocation: Retired Comments: Pt reports deficits in R UE baseline due to arthritis in hand and hx of rotator cuff sx. Pt with weakness in L UE that began around 7/21.  He went to ED thinking possible cardiac issues but determined just weakness, eventually saw neurosurgeon and now had surgery.  Reports L UE weakness was gradually worsening, but reports seems worse after this surgery. Reports only fall in last 6 months was last night. Vision  [facility] - wears glasses for reading only Perception  Perception: Within Functional Limits Praxis Praxis: Intact Cognition Overall Cognitive Status: Within Functional Limits for tasks assessed Arousal/Alertness: Awake/alert Orientation Level: Person;Place;Situation Person: Oriented Place: Oriented Situation: Oriented Year: 2021 Month: October Day of Week: Correct Memory: Appears intact Immediate Memory Recall: Sock;Blue;Bed Memory Recall Sock: Without Cue Memory Recall Blue: Without Cue Memory Recall Bed: Without Cue Attention: Focused;Sustained Focused Attention: Appears intact Sustained Attention: Appears intact Safety/Judgment: Impaired Sensation Sensation Light Touch: Impaired Detail Peripheral sensation comments: decreased distal sensation Light Touch Impaired Details: Impaired RLE;Impaired LLE Hot/Cold: Not tested Proprioception: Impaired Detail Stereognosis: Impaired by gross assessment Coordination Gross Motor Movements are Fluid and Coordinated: No Coordination and Movement Description: severely limited in BUE Finger Nose Finger Test: unable to reach hands forward or reach towards nose Motor  Motor Motor - Skilled Clinical Observations: severely limited mobility in B UE (R due to prior condition, L from nerve impingement); His BUE are fairly non functional for self feeding and grooming unless his arm is supported proximally  Trunk/Postural  Assessment  Cervical Assessment Cervical Assessment: Exceptions to [facility] (in cervical collar) Thoracic Assessment Thoracic Assessment: Exceptions to [facility] (very rounded shoulders even with standing, kyphosis) Lumbar Assessment Lumbar Assessment: Exceptions to [facility] (posterior pelvic tilt) Postural Control Postural Control: Deficits on evaluation Trunk Control: scoliosis impacts postural control  Balance Standardized Balance Assessment Standardized Balance Assessment: Berg Balance Test Berg Balance Test Sit to Stand: Needs minimal aid to stand or to stabilize Standing Unsupported: Able to stand 2 minutes with supervision Sitting with Back Unsupported but Feet Supported on Floor or Stool: Able to sit 2 minutes under supervision  Stand to Sit: Uses backs of legs against chair to control descent Transfers: Able to transfer with verbal cueing and /or supervision Standing Unsupported with Eyes Closed: Able to stand 3 seconds (has R posterior LOB at 10 seconds requiring assist to maintain balance) Standing Ubsupported with Feet Together: Able to place feet together independently and stand for 1 minute with supervision From Standing, Reach Forward with Outstretched Arm: Reaches forward but needs supervision (unable to perform due to B UE weakness) From Standing Position, Pick up Object from Floor: Unable to try/needs assist to keep balance (does not stand to pick up items prior - sits down) From Standing Position, Turn to Look Behind Over each Shoulder: Needs supervision when turning (demos limited hip rotation during this task) Turn 360 Degrees: Needs close supervision or verbal cueing Standing Unsupported, Alternately Place Feet on Step/Stool: Needs assistance to keep from falling or unable to try (requires heavy min assist - pt unable to complete due to fear of falling and declining to continue participating) Standing Unsupported, One Foot in Front: Needs help to step but can hold 15 seconds Standing  on One Leg: Unable to try or needs assist to prevent fall (declines participation due to fear of falling) Total Score: 20 Static Sitting Balance Static Sitting - Level of Assistance: 5: Stand by assistance Dynamic Sitting Balance Dynamic Sitting - Level of Assistance: 4: Min assist Static Standing Balance Static Standing - Level of Assistance: 5: Stand by assistance Dynamic Standing Balance Dynamic Standing - Level of Assistance: 3: Mod assist Extremity/Trunk Assessment   LUE Assessment LUE Assessment: Exceptions to [facility] Passive Range of Motion (PROM) Comments: sh flexion to 90, elbow WFL, Active Range of Motion (AROM) Comments: 10 degrees sh flexion, 60 degrees elbow flexion, full finger and wrist extension General Strength Comments: 4/ 5 grasp strength  Care Tool Care Tool Self Care Eating   Eating Assist Level: Total Assistance - Patient < 25%    Oral Care    Oral Care Assist Level: Total assistance - Patient < 25%    Bathing   Body parts bathed by patient: Chest;Abdomen;Front perineal area;Right upper leg;Left upper leg Body parts bathed by helper: Right lower leg;Left lower leg;Buttocks;Right arm;Left arm   Assist Level: Moderate Assistance - Patient 50 - 74%    Upper Body Dressing(including orthotics)   What is the patient wearing?: Pull over shirt   Assist Level: Moderate Assistance - Patient 50 - 74%    Lower Body Dressing (excluding footwear)   What is the patient wearing?: Underwear/pull up;Pants Assist for lower body dressing: Maximal Assistance - Patient 25 - 49%    Putting on/Taking off footwear   What is the patient wearing?: Non-skid slipper socks Assist for footwear: Maximal Assistance - Patient 25 - 49%       Care Tool Toileting Toileting activity    max A     Care Tool Bed Mobility Roll left and right activity    S    Sit to lying activity    min A    Lying to sitting edge of bed activity   Lying to sitting edge of bed assist level:  Supervision/Verbal cueing     Care Tool Transfers Sit to stand transfer   Sit to stand assist level: Minimal Assistance - Patient > 75%    Chair/bed transfer   Chair/bed transfer assist level: Minimal Assistance - Patient > 75%     Toilet transfer   Assist Level: Minimal Assistance - Patient > 75%  Care Tool Cognition Expression of Ideas and Wants Expression of Ideas and Wants: Without difficulty (complex and basic) - expresses complex messages without difficulty and with speech that is clear and easy to understand   Understanding Verbal and Non-Verbal Content Understanding Verbal and Non-Verbal Content: Understands (complex and basic) - clear comprehension without cues or repetitions   Memory/Recall Ability *first 3 days only Memory/Recall Ability *first 3 days only: Current season;Location of own room;Staff names and faces;That he or she is in a hospital/hospital unit    Refer to Care Plan for [place] 1 OT Short Term Goal 1 (Week 1): STGs = LTGs  Recommendations for other services: None    Skilled Therapeutic Intervention ADL ADL Eating: Dependent Grooming: Dependent Upper Body Bathing: Moderate assistance Where Assessed-Upper Body Bathing: Edge of bed Lower Body Bathing: Moderate assistance Where Assessed-Lower Body Bathing: Edge of bed Upper Body Dressing: Moderate assistance Where Assessed-Upper Body Dressing: Edge of bed Lower Body Dressing: Maximal assistance Where Assessed-Lower Body Dressing: Edge of bed Toileting: Maximal assistance Where Assessed-Toileting: Glass blower/designer: Psychiatric nurse Method: Counselling psychologist: Raised toilet seat Mobility  Transfers Sit to Stand: Contact Guard/Touching assist Stand to Sit: Contact Guard/Touching assist   Pt seen for initial evaluation and ADL training.  Discussed pt's PLOF, current status and his goals for therapy.  Pt is quite adamant  that he only wants to be here 1 week. Suggested 2 based on his function this AM, but pt feels that will be too long.  He has had VERY limited mobility of BUE for considerable time but also stated that he was able to do all of his self care and lived alone.  He did state that he needs a great deal of time to get things accomplished and it does things in a modified way.    Pt sat to EOB to engage in B/d needing mod - max A due to limited arm function. His legs are strong enough to allow him to sit to stand and ambulate with CGA to min A.  Discussed focusing treatment on adaptive approaches as it may take numerous weeks to regain shoulder/elbow strength.   Pt needed to toilet at end of session, ambulated to bathroom with min A, max to adjust clothing. Pt sat on elevated toilet seat.  Pt with call pull in his hand, RN and NT aware of pt in bathroom.     Discharge Criteria: Patient will be discharged from OT if patient refuses treatment 3 consecutive times without medical reason, if treatment goals not met, if there is a change in medical status, if patient makes no progress towards goals or if patient is discharged from hospital.  The above assessment, treatment plan, treatment alternatives and goals were discussed and mutually agreed upon: by patient  [facility] 04/08/2020, 12:51 PM

## 2020-04-08 NOTE — Evaluation (Signed)
Physical Therapy Assessment and Plan  Patient Details  Name: Shannon [name] MRN: [id number] Date of Birth: April 11, 1946  PT Diagnosis: Abnormal posture, Abnormality of gait, Difficulty walking, Hypotonia, Impaired sensation, Muscle weakness, Paralysis and Quadriplegia Rehab Potential: Good ELOS: 7-10 days   Today's Date: 04/08/2020 PT Individual Time: 0802-2336 PT Individual Time Calculation (min): 54 min    Hospital Problem: Principal Problem:   Cervical myelopathy ([place])   Past Medical History:  Past Medical History:  Diagnosis Date  . Asthma   . 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 Sherwood Gambler, 1992-->surg at [facility].  Summer 2021->Cervicalgia,arm pain, hand atrophy RUE wkness, hyperreflex (Dr. 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 Hay fever   . Hypertension   . Osteoarthritis of both hands    Past Surgical History:  Past Surgical History:  Procedure Laterality Date  . ANTERIOR CERVICAL DECOMP/DISCECTOMY FUSION N/A 04/05/2020   Procedure: ANTERIOR CERVICAL DECOMPRESSION/DISCECTOMY FUSION, INTERBODY PROSTHESIS, PLATE/SCREWS CERVICAL THREE-CERVICAL FOUR, CERVICAL FOUR- CERVICAL FIVE;  Surgeon: Newman Pies, MD;  Location: [facility];  Service: Neurosurgery;  Laterality: N/A;  . BACK SURGERY    . INCISION AND DRAINAGE Left 2012   L hand infection  . ROTATOR CUFF REPAIR Right    x 3  . SPINE SURGERY      Assessment & Plan Clinical Impression: Patient is a 74 y.o. year old right-handed male with history of hypertension, asthma as well as Chiari I malformation,syringomyelia.  History taken from chart review, patient, and daughter. Patient lives alone.  1 level home 5 steps to entry.  Reports ambulating without assistive device.  He presented on 04/05/20 with complaints of new left arm pain, numbness as well as chronic arm pain on the right  with numbness tingling weakness as well as unsteady gait difficulty using his hands with decreased coordination.  Admission chemistries unremarkable except glucose of 120, hemoglobin 14.9.  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  Underwent C3-4 C4-5 anterior cervical discectomy decompression interbody arthrodesis on 04/05/20 per Dr. Newman Pies.  Hospital course further complicated by post operative pain. Cervical brace as directed.  Tolerating a regular diet.  Therapy evaluations completed and patient was admitted for a comprehensive rehab program. Prolonged discussion with patient and daughter regarding grievances and process for inpatient rehab. Patient transferred to CIR on 04/07/2020 .   Patient currently requires min assist with mobility secondary to muscle weakness, muscle joint tightness and muscle paralysis, decreased cardiorespiratoy endurance, impaired timing and sequencing, abnormal tone, unbalanced muscle activation and decreased coordination and decreased standing balance, decreased postural control, decreased balance strategies and quadriparesis.  Prior to hospitalization, patient was modified independent  with mobility and lived with Alone in a House home.  Home access is 5-6Stairs to enter.  Patient will benefit from skilled PT intervention to maximize safe functional mobility, minimize fall risk and decrease caregiver burden for planned discharge home with 24 hour supervision.  Anticipate patient will benefit from follow up [place] at discharge.  PT - End of Session Activity Tolerance: Tolerates 30+ min activity with multiple rests Endurance Deficit: Yes PT Assessment Rehab Potential (ACUTE/IP ONLY): Good PT Barriers to Discharge: [place] home environment;Decreased caregiver support;Home environment access/layout;Lack of/limited family support PT Patient demonstrates impairments in the following area(s):  Balance;Perception;Behavior;Safety;Edema;Sensory;Endurance;Skin Integrity;Motor;Nutrition;Pain PT Transfers Functional Problem(s): Bed Mobility;Bed to Chair;Car;Furniture;Floor PT Locomotion Functional Problem(s): Ambulation;Stairs PT Plan  PT Intensity: Minimum of 1-2 x/day ,45 to 90 minutes PT Frequency: 5 out of 7 days PT Duration Estimated Length of Stay: 7-10 days PT Treatment/Interventions: Ambulation/gait training;Community reintegration;DME/adaptive equipment instruction;Psychosocial support;Neuromuscular re-education;Stair training;UE/LE Strength taining/ROM;Balance/vestibular training;Discharge planning;Functional electrical stimulation;Pain management;Skin care/wound management;Therapeutic Activities;UE/LE Coordination activities;Cognitive remediation/compensation;Disease management/prevention;Functional mobility training;Patient/family education;Splinting/orthotics;Therapeutic Exercise;Visual/perceptual remediation/compensation PT Transfers Anticipated Outcome(s): supervision PT Locomotion Anticipated Outcome(s): supervision PT Recommendation Follow Up Recommendations: Home health PT;24 hour supervision/assistance Patient destination: Home Equipment Recommended: To be determined   PT Evaluation Precautions/Restrictions Precautions Precautions: Cervical Precaution Booklet Issued: Yes (comment) Required Braces or Orthoses: Cervical Brace Cervical Brace: Hard collar;Other (comment) (don in sitting) Restrictions Weight Bearing Restrictions: No Pain Pain Assessment Pain Scale: 0-10 (reports baseline R shoulder pain that is present 24/7 from prior RTC repair) Pain Score: 0-No pain Pain Type: Surgical pain Home Living/Prior Functioning Home Living Available Help at Discharge: Family;Friend(s);Available PRN/intermittently Type of Home: House Home Access: Stairs to enter [employer] of Steps: 5-6 Entrance Stairs-Rails: Right Home Layout: One level Bathroom  Shower/Tub: Tub/shower unit;Curtain Bathroom Toilet: Standard  Lives With: Alone Prior Function Level of Independence: Independent with gait;Independent with transfers;Independent with homemaking with ambulation (states "I didn't look steady but I did it")  Able to Take Stairs?: Yes Driving: Yes Vocation: Retired Comments: Pt reports deficits in R UE baseline due to arthritis in hand and hx of rotator cuff sx. Pt with weakness in L UE that began around 7/21.  He went to ED thinking possible cardiac issues but determined just weakness, eventually saw neurosurgeon and now had surgery.  Reports L UE weakness was gradually worsening, but reports seems worse after this surgery. Reports only fall in last 6 months was last night. Perception  Perception Perception: Within Functional Limits Praxis Praxis: Intact  Cognition Overall Cognitive Status: Within Functional Limits for tasks assessed Arousal/Alertness: Awake/alert Orientation Level: Oriented X4 Attention: Focused;Sustained Focused Attention: Appears intact Sustained Attention: Appears intact Safety/Judgment: Impaired  Sensation Sensation Light Touch: Impaired Detail Peripheral sensation comments: decreased distal sensation bilaterally but more impaired R LE sensation Light Touch Impaired Details: Impaired RLE;Impaired LLE Hot/Cold: Not tested Proprioception: Impaired Detail Proprioception Impaired Details: Impaired RLE;Impaired LLE Stereognosis: Not tested Coordination Gross Motor Movements are Fluid and Coordinated: No Coordination and Movement Description: impaired due to quadriparesis Finger Nose Finger Test: unable to reach hands forward or reach towards nose Motor  Motor Motor: Abnormal postural alignment and control;Tetraplegia;Abnormal tone Motor - Skilled Clinical Observations: severely limited mobility in B UE (R due to prior condition, L from nerve impingement); His BUEs are fairly non functional for self feeding and  grooming unless his arm is supported proximally   Trunk/Postural Assessment  Cervical Assessment Cervical Assessment: Exceptions to [facility] (in hard cervical collar) Thoracic Assessment Thoracic Assessment: Exceptions to [facility] (rounded shoulders with kyphosis and likely scoliotic curvature noted) Lumbar Assessment Lumbar Assessment: Exceptions to [facility] (significant posterior pelvic tilt in sitting) Postural Control Postural Control: Deficits on evaluation Trunk Control: scoliosis impacts postural control  Balance Balance Balance Assessed: Yes Standardized Balance Assessment Standardized Balance Assessment: Berg Balance Test Berg Balance Test Sit to Stand: Needs minimal aid to stand or to stabilize Standing Unsupported: Able to stand 2 minutes with supervision Sitting with Back Unsupported but Feet Supported on Floor or Stool: Able to sit 2 minutes under supervision Stand to Sit: Uses backs of legs against chair to control descent Transfers: Able to transfer with verbal cueing and /or supervision Standing Unsupported with Eyes Closed: Able to stand 3 seconds (has R posterior  LOB at 10 seconds requiring assist to maintain balance) Standing Ubsupported with Feet Together: Able to place feet together independently and stand for 1 minute with supervision From Standing, Reach Forward with Outstretched Arm: Reaches forward but needs supervision (unable to perform due to B UE weakness) From Standing Position, Pick up Object from Floor: Unable to try/needs assist to keep balance (does not stand to pick up items prior - sits down) From Standing Position, Turn to Look Behind Over each Shoulder: Needs supervision when turning (demos limited hip rotation during this task) Turn 360 Degrees: Needs close supervision or verbal cueing Standing Unsupported, Alternately Place Feet on Step/Stool: Needs assistance to keep from falling or unable to try (requires heavy min assist - pt unable to complete due to fear of  falling and declining to continue participating) Standing Unsupported, One Foot in Front: Needs help to step but can hold 15 seconds Standing on One Leg: Unable to try or needs assist to prevent fall (declines participation due to fear of falling) Total Score: 20 Static Sitting Balance Static Sitting - Balance Support: Feet supported Static Sitting - Level of Assistance: 5: Stand by assistance Dynamic Sitting Balance Dynamic Sitting - Level of Assistance: 4: Min assist Static Standing Balance Static Standing - Level of Assistance: 5: Stand by assistance Dynamic Standing Balance Dynamic Standing - Balance Support: During functional activity Dynamic Standing - Level of Assistance: 4: Min assist;3: Mod assist Extremity Assessment      RLE Assessment RLE Assessment: Exceptions to [facility] RLE Strength Right Hip Flexion: 3+/5 Right Knee Flexion: 3-/5 Right Knee Extension: 4-/5 Right Ankle Dorsiflexion: 4/5 Right Ankle Plantar Flexion: 4/5 LLE Assessment LLE Assessment: Exceptions to [facility] LLE Strength Left Hip Flexion: 4-/5 Left Knee Flexion: 4-/5 Left Knee Extension: 4/5 Left Ankle Dorsiflexion: 4/5 Left Ankle Plantar Flexion: 4/5  Care Tool Care Tool Bed Mobility Roll left and right activity   Roll left and right assist level: Supervision/Verbal cueing    Sit to lying activity   Sit to lying assist level: Moderate Assistance - Patient 50 - 74%    Lying to sitting edge of bed activity   Lying to sitting edge of bed assist level: Supervision/Verbal cueing     Care Tool Transfers Sit to stand transfer   Sit to stand assist level: Minimal Assistance - Patient > 75%    Chair/bed transfer   Chair/bed transfer assist level: Minimal Assistance - Patient > 75%     Physiological scientist transfer assist level: Minimal Assistance - Patient > 75%      Care Tool Locomotion Ambulation   Assist level: Minimal Assistance - Patient > 75% Assistive device: No  Device Max distance: 236f  Walk 10 feet activity   Assist level: Minimal Assistance - Patient > 75% Assistive device: No Device   Walk 50 feet with 2 turns activity   Assist level: Minimal Assistance - Patient > 75% Assistive device: No Device  Walk 150 feet activity   Assist level: Minimal Assistance - Patient > 75% Assistive device: No Device  Walk 10 feet on uneven surfaces activity   Assist level: Minimal Assistance - Patient > 75% Assistive device: Other (comment) (none)  Stairs   Assist level: Minimal Assistance - Patient > 75% Stairs assistive device: 1 hand rail Max number of stairs: 8  Walk up/down 1 step activity   Walk up/down 1 step (curb) assist level: Minimal Assistance - Patient > 75%  Walk up/down 1 step or curb assistive device: 1 hand rail    Walk up/down 4 steps activity Walk up/down 4 steps assist level: Minimal Assistance - Patient > 75% Walk up/down 4 steps assistive device: 1 hand rail  Walk up/down 12 steps activity Walk up/down 12 steps activity did not occur: Safety/medical concerns      Pick up small objects from floor Pick up small object from the floor (from standing position) activity did not occur: Safety/medical concerns (at baseline tries to avoid this task & if he has to he sits down and uses a device)      Wheelchair            Wheel 50 feet with 2 turns activity      Wheel 150 feet activity        Refer to Care Plan for Long Term Goals  SHORT TERM GOAL WEEK 1 PT Short Term Goal 1 (Week 1): Pt will perform supine<>sit with CGA PT Short Term Goal 2 (Week 1): Pt will perform sit<>stands with CGA PT Short Term Goal 3 (Week 1): Pt will perforrm bed<>chair transfers with CGA PT Short Term Goal 4 (Week 1): Pt will ambulate at least 164f using LRAD with CGA PT Short Term Goal 5 (Week 1): Pt will ascend/descend 8 steps using R HR with CGA   Recommendations for other services: None   Skilled Therapeutic Intervention Evaluation completed  (see details above) with patient education regarding purpose of PT evaluation, PT POC and goals, therapy schedule, weekly team meetings, and other CIR information including safety plan and fall risk safety. Pt received sitting EOB with his daughter present and pt agreeable to therapy session. Pt wearing hard cervical collar throughout session. Sit<>stands with varying CGA/min assist for steadying/balance due to posterior lean when coming to stand with pt leaning backs of legs onto seat. Gait training ~1524f no AD, with min assist for steadying/balance - did not use AD at this time due to pt's significant B UE paresis with impaired ability to manage AD - pt demos R LE adducted with decreased hip/knee flexion and poor foot clearance during swing, increased R/L lateral trunk lean during stance phases and decreased R stance time with pt stating he has a "hip hop" walk that has been present for years - intermittently will have minor R anterior LOB. Ascended/descended 8 steps using UE support on R HR via step-to pattern - pt leads with L LE on descent reporting that is what he does at home despite some increased stability noted leading with that LE compared to the R. Simulated car transfer (sedan height) with min assist for balance and for lifting into standing from lower seat height. Ambulated ~1542fp/down ramp x2 with min assist for balance - ambulating up the ramp made his impaired R LE swing phase impairments more prominent causing R foot drag. Pt ambulated to/from therapy gyms and back to his room all without AD with min assist and demoing above gait impairments. [facility] demonstrates increased fall risk as noted by score of   20/56.  (<36= high risk for falls, close to 100%; 37-45 significant >80%; 46-51 moderate >50%; 52-55 lower >25%). Pt reports that baseline he would sit in a chair in order to pick an object up from the floor and due to pt's B UE paresis unable to  participate in the reaching forward task. Once returned to room pt requests to lie down performing sit>supine with mod assist  for B LE management into the bed - reinforced prior education regarding logroll technique with pt demoing minimal carryover. Pt left supine in bed with needs in reach, hard collar on, telesitter in place, and bed alarm on.  Mobility Bed Mobility Bed Mobility: Supine to Sit;Sit to Supine Supine to Sit: Supervision/Verbal cueing Sit to Supine: Moderate Assistance - Patient 50-74%;Minimal Assistance - Patient > 75% Transfers Transfers: Sit to Stand;Stand to Sit;Stand Pivot Transfers Sit to Stand: Minimal Assistance - Patient > 75% (posterior lean) Stand to Sit: Minimal Assistance - Patient > 75% Stand Pivot Transfers: Minimal Assistance - Patient > 75% Stand Pivot Transfer Details: Tactile cues for initiation;Tactile cues for sequencing;Tactile cues for posture;Tactile cues for weight shifting;Verbal cues for technique;Verbal cues for sequencing;Manual facilitation for weight shifting Transfer (Assistive device): None Locomotion  Gait Ambulation: Yes Gait Assistance: Minimal Assistance - Patient > 75% Gait Distance (Feet): 200 Feet Assistive device: None Gait Assistance Details: Tactile cues for sequencing;Tactile cues for posture;Tactile cues for weight shifting;Verbal cues for gait pattern;Verbal cues for technique;Verbal cues for precautions/safety;Verbal cues for sequencing Gait Gait: Yes Gait Pattern: Impaired Gait Pattern: Step-through pattern;Decreased stance time - right;Decreased step length - left;Decreased step length - right;Decreased stride length;Decreased hip/knee flexion - right;Decreased dorsiflexion - right;Poor foot clearance - right Gait velocity: decreased Stairs / Additional Locomotion Stairs: Yes Stairs Assistance: Minimal Assistance - Patient > 75% Stair Management Technique: One rail Right Number of Stairs: 8 Height of Stairs: 6 Ramp:  Minimal Assistance - Patient >75% Curb: Moderate Assistance - Patient 50 - 74% Wheelchair Mobility Wheelchair Mobility: No   Discharge Criteria: Patient will be discharged from PT if patient refuses treatment 3 consecutive times without medical reason, if treatment goals not met, if there is a change in medical status, if patient makes no progress towards goals or if patient is discharged from hospital.  The above assessment, treatment plan, treatment alternatives and goals were discussed and mutually agreed upon: by patient  Tawana Scale , PT, DPT, CSRS  04/08/2020, 8:00 AM

## 2020-04-08 NOTE — Progress Notes (Signed)
Pt stated he was trying to get out of bed and slid from bed to floor landing on his buttocks. Pt stated "I didn't hit my head, just fell trying to get to bathroom, I'm not experiencing any pain."

## 2020-04-08 NOTE — Progress Notes (Signed)
   04/08/20 0302  What Happened  Was fall witnessed? No  Was patient injured? No  Patient found on floor  Found by Staff-comment Irving Burton, NTA)  Stated prior activity ambulating-unassisted  Follow Up  MD notified Dr.Kirsteins  Time MD notified [phone]  Family notified Yes - comment Oswell Say)  Time family notified [phone]  Additional tests No  Progress note created (see row info) Yes  Adult Fall Risk Assessment  Risk Factor Category (scoring not indicated) Not Applicable  Age 74  Fall History: Fall within 6 months prior to admission 0  Elimination; Bowel and/or Urine Incontinence 2  Elimination; Bowel and/or Urine Urgency/Frequency 2  Medications: includes PCA/Opiates, Anti-convulsants, Anti-hypertensives, Diuretics, Hypnotics, Laxatives, Sedatives, and Psychotropics 5  Patient Care Equipment 2  Mobility-Assistance 2  Mobility-Gait 2  Mobility-Sensory Deficit 0  Altered awareness of immediate physical environment 0  Impulsiveness 2  Lack of understanding of one's physical/cognitive limitations 4  Total Score 23  Patient Fall Risk Level High fall risk  Adult Fall Risk Interventions  Required Bundle Interventions *See Row Information* High fall risk - low, moderate, and high requirements implemented  Additional Interventions Room near nurses station;Use of appropriate toileting equipment (bedpan, BSC, etc.)  Screening for Fall Injury Risk (To be completed on HIGH fall risk patients) - Assessing Need for Low Bed  Risk For Fall Injury- Low Bed Criteria Previous fall this admission  Screening for Fall Injury Risk (To be completed on HIGH fall risk patients who do not meet crieteria for Low Bed) - Assessing Need for Floor Mats Only  Risk For Fall Injury- Criteria for Floor Mats Noncompliant with safety precautions  Will Implement Floor Mats Yes  Vitals  Temp 98.3 F (36.8 C)  BP 132/83  MAP (mmHg) 96  BP Location Right Arm  BP Method Automatic  Patient Position (if appropriate)  Sitting  Pulse Rate 97  Resp 16  Oxygen Therapy  SpO2 96 %  O2 Device Room Air  Pain Assessment  Pain Scale 0-10  Pain Score 0

## 2020-04-08 NOTE — Progress Notes (Signed)
PHYSICAL MEDICINE & REHABILITATION PROGRESS NOTE   Subjective/Complaints:  No issues overnite, except fall on to buttocks states he was reaching for call bell at EOB and lost balance .  No pains or increased weakness  ROS- neg CP, SOB, N/V/D, + phlegm in throat post op    Objective:   No results found. No results for input(s): WBC, HGB, HCT, PLT in the last 72 hours. No results for input(s): NA, K, CL, CO2, GLUCOSE, BUN, CREATININE, CALCIUM in the last 72 hours. No intake or output data in the 24 hours ending 04/08/20 0626      Physical Exam: Vital Signs Blood pressure 114/67, pulse 94, temperature 98.6 F (37 C), temperature source Oral, resp. rate 16, height 5\' 10"  (1.778 m), weight 91 kg, SpO2 94 %.  General: No acute distress Mood and affect are appropriate Heart: Regular rate and rhythm no rubs murmurs or extra sounds Lungs: Clear to auscultation, breathing unlabored, no rales or wheezes Abdomen: Positive bowel sounds, soft nontender to palpation, nondistended Extremities: No clubbing, cyanosis, or edema Skin: No evidence of breakdown, no evidence of rash Neurologic: motor strength is 3-/5 in bilateral deltoid,4- bicep, 3- tricep,3- grip,4- hip flexor, 4-knee extensors, 3-ankle dorsiflexor and plantar flexor, + ataxia in LEs  Musculoskeletal: no pain with ROM  range of motion in all 4 extremities. No joint swelling    Assessment/Plan: 1. 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  Physiatrist is providing close team supervision and 24 hour management of active medical problems listed below.  Physiatrist and rehab team continue to assess barriers to discharge/monitor patient progress toward functional and medical goals  Care Tool:  Bathing              Bathing assist       Upper Body Dressing/Undressing Upper body dressing    What is the patient wearing?: Hospital gown only    Upper body assist Assist Level: Minimal Assistance - Patient > 75%    Lower Body Dressing/Undressing Lower body dressing      What is the patient wearing?: Hospital gown only     Lower body assist Assist for lower body dressing: Minimal Assistance - Patient > 75%     Toileting Toileting    Toileting assist Assist for toileting: Contact Guard/Touching assist     Transfers Chair/bed transfer  Transfers assist     Chair/bed transfer assist level: Contact Guard/Touching assist     Locomotion Ambulation   Ambulation assist              Walk 10 feet activity   Assist           Walk 50 feet activity   Assist           Walk 150 feet activity   Assist           Walk 10 feet on uneven surface  activity   Assist           Wheelchair     Assist               Wheelchair 50 feet with 2 turns activity    Assist            Wheelchair 150 feet activity     Assist          Blood pressure 114/67, pulse 94, temperature 98.6 F (37 C), temperature source  Oral, resp. rate 16, height 5\' 10"  (1.778 m), weight 91 kg, SpO2 94 %. Medical Problem List and Plan: 1.  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  Antithrombotics: -DVT/anticoagulation:  SCDs             -antiplatelet therapy: N/A  3. Pain Management: Flexeril and oxycodone as needed             Monitor for post-op and neuropathic pain with increased exertion 4. Mood: Provide emotional support             -antipsychotic agents: N/A 5. Neuropsych: This patient is capable of making decisions on his own behalf. 6. Skin/Wound Care: Routine skin  checks 7. Fluids/Electrolytes/Nutrition: Routine in and outs. CMP ordered. 8.  Hypertension.  Norvasc 10 mg daily.               Monitor with increased mobility 9. Asthma             Monitor respiratory states with activity 10.  Safety- pt without confusion, is not aware of current limitations, should improve with PT, OT  Will use telesitter x 24-48h then reassess    LOS: 1 days A FACE TO FACE EVALUATION WAS PERFORMED  04/07/2020 04/08/2020, 6:26 AM

## 2020-04-09 DIAGNOSIS — G959 Disease of spinal cord, unspecified: Secondary | ICD-10-CM | POA: Diagnosis not present

## 2020-04-09 NOTE — Progress Notes (Signed)
[facility] PHYSICAL MEDICINE & REHABILITATION PROGRESS NOTE   Subjective/Complaints:  No further falls , per RN still got up without ringing call bell  ROS- neg CP, SOB, N/V/D,     Objective:   No results found. No results for input(s): WBC, HGB, HCT, PLT in the last 72 hours. No results for input(s): NA, K, CL, CO2, GLUCOSE, BUN, CREATININE, CALCIUM in the last 72 hours.  Intake/Output Summary (Last 24 hours) at 04/09/2020 0649 Last data filed at 04/08/2020 2006 Gross per 24 hour  Intake 120 ml  Output --  Net 120 ml        Physical Exam: Vital Signs Blood pressure 133/85, pulse 97, temperature 98.3 F (36.8 C), temperature source Oral, resp. rate 18, height 5\' 10"  (1.778 m), weight 91 kg, SpO2 93 %.  General: No acute distress Mood and affect are appropriate Heart: Regular rate and rhythm no rubs murmurs or extra sounds Lungs: Clear to auscultation, breathing unlabored, no rales or wheezes Abdomen: Positive bowel sounds, soft nontender to palpation, nondistended Extremities: No clubbing, cyanosis, or edema Skin: No evidence of breakdown, no evidence of rash  Neurologic: motor strength is 3-/5 in bilateral deltoid,4- bicep, 3- tricep,3- grip,4- hip flexor, 4-knee extensors, 3-ankle dorsiflexor and plantar flexor, + ataxia in LEs  Musculoskeletal: no pain with ROM  range of motion in all 4 extremities. No joint swelling    Assessment/Plan: 1. 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  Physiatrist is providing close team supervision and 24 hour management of active medical problems listed below.  Physiatrist and rehab team continue to assess barriers to discharge/monitor patient progress toward functional and medical goals  Care Tool:  Bathing    Body parts bathed by patient: Chest, Abdomen, Front perineal area, Right upper leg, Left upper leg    Body parts bathed by helper: Right lower leg, Left lower leg, Buttocks, Right arm, Left arm     Bathing assist Assist Level: Moderate Assistance - Patient 50 - 74%     Upper Body Dressing/Undressing Upper body dressing   What is the patient wearing?: Pull over shirt    Upper body assist Assist Level: Moderate Assistance - Patient 50 - 74%    Lower Body Dressing/Undressing Lower body dressing      What is the patient wearing?: Pants, Underwear/pull up     Lower body assist Assist for lower body dressing: Total Assistance - Patient < 25%     Toileting Toileting    Toileting assist Assist for toileting: Maximal Assistance - Patient 25 - 49%     Transfers Chair/bed transfer  Transfers assist     Chair/bed transfer assist level: Minimal Assistance - Patient > 75%     Locomotion Ambulation   Ambulation assist      Assist level: Minimal Assistance - Patient > 75% Assistive device: No Device Max distance: 252ft   Walk 10 feet activity   Assist     Assist level: Minimal Assistance - Patient > 75% Assistive device: No Device   Walk 50 feet activity   Assist    Assist level: Minimal Assistance - Patient > 75% Assistive device: No Device    Walk 150 feet activity   Assist    Assist level: Minimal Assistance - Patient > 75% Assistive device: No Device    Walk 10 feet on uneven surface  activity   Assist  Assist level: Minimal Assistance - Patient > 75% Assistive device: Other (comment) (none)   Wheelchair     Assist Will patient use wheelchair at discharge?: No             Wheelchair 50 feet with 2 turns activity    Assist            Wheelchair 150 feet activity     Assist          Blood pressure 133/85, pulse 97, temperature 98.3 F (36.8 C), temperature source Oral, resp. rate 18, height 5\' 10"  (1.778 m), weight 91 kg, SpO2 93 %. Medical Problem List and Plan: 1.  Quadriparesis secondary to  cervical radiculopathy with myelopathy as well as history of Chiari malformation status post C3-4 4-5 anterior cervical discectomy and decompression 04/05/2020             -patient may not shower             -ELOS/Goals: 12-16 days/Supervision/Mod I             CIR PT, OT, evals 2.  Antithrombotics: -DVT/anticoagulation:  SCDs             -antiplatelet therapy: N/A  3. Pain Management: Flexeril and oxycodone as needed             Monitor for post-op and neuropathic pain with increased exertion 4. Mood: Provide emotional support             -antipsychotic agents: N/A 5. Neuropsych: This patient is capable of making decisions on his own behalf. 6. Skin/Wound Care: Routine skin checks 7. Fluids/Electrolytes/Nutrition: Routine in and outs. CMP ordered. 8.  Hypertension.  Norvasc 10 mg daily.                Vitals:   04/08/20 1909 04/09/20 0601  BP: 126/82 133/85  Pulse: 91 97  Resp: 17 18  Temp: 98.8 F (37.1 C) 98.3 F (36.8 C)  SpO2: 99% 93%  controlled 10/16 9. Asthma             Monitor respiratory states with activity 10.  Safety- pt without confusion, is not aware of current limitations, should improve with PT, OT  Will use telesitter until pt using call bell consistently, now has "pancake"    LOS: 2 days A FACE TO FACE EVALUATION WAS PERFORMED  11/16 04/09/2020, 6:49 AM

## 2020-04-09 NOTE — Progress Notes (Signed)
No complaints or issues today. Pt noted with occasional attempts to get up unassisted when needing to go to bathroom. Able to be quickly redirected. No prn meds given today.

## 2020-04-09 NOTE — Plan of Care (Signed)
  Problem: Consults Goal: RH SPINAL CORD INJURY PATIENT EDUCATION Description:  See Patient Education module for education specifics.  Outcome: Progressing   Problem: RH SKIN INTEGRITY Goal: RH STG SKIN FREE OF INFECTION/BREAKDOWN Description: Maintain incision site free of infection with mod I asssist Outcome: Progressing   Problem: RH SAFETY Goal: RH STG ADHERE TO SAFETY PRECAUTIONS W/ASSISTANCE/DEVICE Description: STG Adhere to Safety Precautions With mod I assist. Outcome: Progressing

## 2020-04-10 ENCOUNTER — Inpatient Hospital Stay (HOSPITAL_COMMUNITY): Payer: Medicare Other | Admitting: Occupational Therapy

## 2020-04-10 ENCOUNTER — Inpatient Hospital Stay (HOSPITAL_COMMUNITY): Payer: Medicare Other

## 2020-04-10 DIAGNOSIS — G959 Disease of spinal cord, unspecified: Secondary | ICD-10-CM | POA: Diagnosis not present

## 2020-04-10 LAB — COMPREHENSIVE METABOLIC PANEL
ALT: 19 U/L (ref 0–44)
AST: 29 U/L (ref 15–41)
Albumin: 3.3 g/dL — ABNORMAL LOW (ref 3.5–5.0)
Alkaline Phosphatase: 57 U/L (ref 38–126)
Anion gap: 11 (ref 5–15)
BUN: 5 mg/dL — ABNORMAL LOW (ref 8–23)
CO2: 28 mmol/L (ref 22–32)
Calcium: 9.2 mg/dL (ref 8.9–10.3)
Chloride: 103 mmol/L (ref 98–111)
Creatinine, Ser: 0.94 mg/dL (ref 0.61–1.24)
GFR, Estimated: >60 mL/min (ref >60)
Glucose, Bld: 109 mg/dL — ABNORMAL HIGH (ref 70–99)
Potassium: 3.6 mmol/L (ref 3.5–5.1)
Sodium: 142 mmol/L (ref 135–145)
Total Bilirubin: 1.7 mg/dL — ABNORMAL HIGH (ref 0.3–1.2)
Total Protein: 7.1 g/dL (ref 6.5–8.1)

## 2020-04-10 LAB — CBC WITH DIFFERENTIAL/PLATELET
Abs Immature Granulocytes: 0.02 10*3/uL (ref 0.00–0.07)
Basophils Absolute: 0.1 10*3/uL (ref 0.0–0.1)
Basophils Relative: 1 %
Eosinophils Absolute: 0.4 10*3/uL (ref 0.0–0.5)
Eosinophils Relative: 6 %
HCT: 40 % (ref 39.0–52.0)
Hemoglobin: 12.9 g/dL — ABNORMAL LOW (ref 13.0–17.0)
Immature Granulocytes: 0 %
Lymphocytes Relative: 31 %
Lymphs Abs: 1.9 10*3/uL (ref 0.7–4.0)
MCH: 27.9 pg (ref 26.0–34.0)
MCHC: 32.3 g/dL (ref 30.0–36.0)
MCV: 86.6 fL (ref 80.0–100.0)
Monocytes Absolute: 0.7 10*3/uL (ref 0.1–1.0)
Monocytes Relative: 12 %
Neutro Abs: 3.1 10*3/uL (ref 1.7–7.7)
Neutrophils Relative %: 50 %
Platelets: 166 10*3/uL (ref 150–400)
RBC: 4.62 MIL/uL (ref 4.22–5.81)
RDW: 13.9 % (ref 11.5–15.5)
WBC: 6.1 10*3/uL (ref 4.0–10.5)
nRBC: 0 % (ref 0.0–0.2)

## 2020-04-10 MED ORDER — POLYETHYLENE GLYCOL 3350 17 G PO PACK
17.0000 g | PACK | Freq: Every day | ORAL | Status: DC
  Administered 2020-04-10 – 2020-04-13 (×4): 17 g via ORAL
  Filled 2020-04-10 (×9): qty 1

## 2020-04-10 NOTE — Progress Notes (Addendum)
PHYSICAL MEDICINE & REHABILITATION PROGRESS NOTE   Subjective/Complaints: Was told he had a BM over the weekend but did not feel he did. Nursing students reported good BM to RN. [birth year] today! Labs stable this morning  ROS- denies CP, SOB, N/V/D,     Objective:   No results found. Recent Labs    04/10/20 0709  WBC 6.1  HGB 12.9*  HCT 40.0  PLT 166   Recent Labs    04/10/20 0709  NA 142  K 3.6  CL 103  CO2 28  GLUCOSE 109*  BUN 5*  CREATININE 0.94  CALCIUM 9.2    Intake/Output Summary (Last 24 hours) at 04/10/2020 0854 Last data filed at 04/09/2020 2000 Gross per 24 hour  Intake 460 ml  Output --  Net 460 ml        Physical Exam: Vital Signs Blood pressure 139/68, pulse 89, temperature 98.3 F (36.8 C), resp. rate 17, height 5\' 10"  (1.778 m), weight 91 kg, SpO2 93 %. General: Alert and oriented x 3, No apparent distress HEENT: Head is normocephalic, atraumatic, PERRLA, EOMI, sclera anicteric, oral mucosa pink and moist, dentition intact, ext ear canals clear,  Neck: Supple without JVD or lymphadenopathy Heart: Reg rate and rhythm. No murmurs rubs or gallops Chest: CTA bilaterally without wheezes, rales, or rhonchi; no distress Abdomen: Soft, non-tender, non-distended, bowel sounds positive. Extremities: No clubbing, cyanosis, or edema. Pulses are 2+ Skin: Clean and intact without signs of breakdown Neurologic: motor strength is 3-/5 in bilateral deltoid,4- bicep, 3- tricep,3- grip,4- hip flexor, 4-knee extensors, 3-ankle dorsiflexor and plantar flexor, + ataxia in LEs  Musculoskeletal: no pain with ROM  range of motion in all 4 extremities. No joint swelling     Assessment/Plan: 1. 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  Physiatrist is providing close team supervision and 24 hour management of active medical  problems listed below.  Physiatrist and rehab team continue to assess barriers to discharge/monitor patient progress toward functional and medical goals  Care Tool:  Bathing    Body parts bathed by patient: Chest, Abdomen, Front perineal area, Right upper leg, Left upper leg   Body parts bathed by helper: Right lower leg, Left lower leg, Buttocks, Right arm, Left arm     Bathing assist Assist Level: Moderate Assistance - Patient 50 - 74%     Upper Body Dressing/Undressing Upper body dressing   What is the patient wearing?: Pull over shirt    Upper body assist Assist Level: Moderate Assistance - Patient 50 - 74%    Lower Body Dressing/Undressing Lower body dressing      What is the patient wearing?: Underwear/pull up, Pants     Lower body assist Assist for lower body dressing: Total Assistance - Patient < 25%     Toileting Toileting    Toileting assist Assist for toileting: Moderate Assistance - Patient 50 - 74%     Transfers Chair/bed transfer  Transfers assist     Chair/bed transfer assist level: Minimal Assistance - Patient > 75%     Locomotion Ambulation   Ambulation assist      Assist level: Moderate Assistance - Patient 50 - 74% Assistive device: Other (comment) (gait velt) Max distance: 262ft   Walk 10 feet activity   Assist     Assist level: 2 helpers Assistive device: No Device   Walk 50 feet  activity   Assist    Assist level: Minimal Assistance - Patient > 75% Assistive device: No Device    Walk 150 feet activity   Assist    Assist level: Minimal Assistance - Patient > 75% Assistive device: No Device    Walk 10 feet on uneven surface  activity   Assist     Assist level: Minimal Assistance - Patient > 75% Assistive device: Other (comment) (none)   Wheelchair     Assist Will patient use wheelchair at discharge?: No             Wheelchair 50 feet with 2 turns activity    Assist             Wheelchair 150 feet activity     Assist          Blood pressure 139/68, pulse 89, temperature 98.3 F (36.8 C), resp. rate 17, height 5\' 10"  (1.778 m), weight 91 kg, SpO2 93 %. Medical Problem List and Plan: 1.  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  2.  Antithrombotics: -DVT/anticoagulation:  SCDs             -antiplatelet therapy: N/A  3. Pain Management: Flexeril and oxycodone as needed             Monitor for post-op and neuropathic pain with increased exertion 4. Mood: Provide emotional support             -antipsychotic agents: N/A 5. Neuropsych: This patient is capable of making decisions on his own behalf. 6. Skin/Wound Care: Routine skin checks 7. Fluids/Electrolytes/Nutrition: Routine in and outs. CMP ordered. 8.  Hypertension.  Norvasc 10 mg daily.                Vitals:   04/09/20 1922 04/10/20 0412  BP: 117/72 139/68  Pulse: 91 89  Resp: 16 17  Temp: 98.3 F (36.8 C) 98.3 F (36.8 C)  SpO2: 94% 93%  controlled 10/18 9. Asthma             Monitor respiratory states with activity 10.  Safety- pt without confusion, is not aware of current limitations, should improve with PT, OT  Will use telesitter until pt using call bell consistently, now has "pancake" 11. Constipation: started daily Miralax 12. Acute blood loss anemia: Hgb 12.9 on 10/18 13. Disposition: lives alone. Daughter will be staying with him a little when he leaves here.   >35 minutes spent in discussion of autonomic dysreflexia, acute blood loss anemia, labs, disposition, blood pressure, prior rotator cuff surgery.     LOS: 3 days A  FACE TO FACE EVALUATION WAS PERFORMED  11/18 Phyllicia Dudek 04/10/2020, 8:54 AM

## 2020-04-10 NOTE — Progress Notes (Signed)
Physical Therapy Session Note  Patient Details  Name: Shannon [name] MRN: [id number] Date of Birth: Oct 05, 1945  Today's Date: 04/10/2020 PT Individual Time:Session1: 1015-1040; Session2: 1340-1400 PT Individual Time Calculation (min): 25 min   Short Term Goals: Week 1:  PT Short Term Goal 1 (Week 1): Pt will perform supine<>sit with CGA PT Short Term Goal 2 (Week 1): Pt will perform sit<>stands with CGA PT Short Term Goal 3 (Week 1): Pt will perforrm bed<>chair transfers with CGA PT Short Term Goal 4 (Week 1): Pt will ambulate at least 138ft using LRAD with CGA PT Short Term Goal 5 (Week 1): Pt will ascend/descend 8 steps using R HR with CGA  Skilled Therapeutic Interventions/Progress Updates:    Session1:  Patient in straight back chair by the bedside reports needed to don t-shirt so assisted with mod A to don t-shirt and put on deodorant.  Patient sit to stand with min A and ambulated to sink with min to mod A and stood to complete oral care with min A for L UE held up from shoulder.  Pt unable to hold cup for rinsing so assisted for rinsing.  Combing hair with max A.  Patient assisted to sit on EOB to change to recliner, but would not fit under set up to allow for self feeding so replaced in straight back chair with alarm belt and all needs in reach.  Session2:  Patient in chair in room. Donned shoes max A.  Ambulated in hallway 120' with mod A due to L foot catching on floor and max cues for keeping upright posture and forward gaze.  Patient performed sit to supine on mat min A for technique and lifting one leg.  Supine therex including bridging, lateral trunk rotation, hip abduction with green t-band, and marching in hooklying.  Handoff to another PT in gym end of session.   Therapy Documentation Precautions:  Precautions Precautions: Cervical Precaution Booklet Issued: Yes (comment) Required Braces or Orthoses: Cervical Brace Cervical Brace: Hard collar, Other (comment) (don in  sitting) Restrictions Weight Bearing Restrictions: No Pain: Pain Assessment Pain Score: 4  Pain Type: Chronic pain Pain Location: Shoulder Pain Orientation: Right Pain Descriptors / Indicators: Guarding Pain Onset: On-going Pain Intervention(s): Repositioned       Therapy/Group: Individual Therapy  Elray Mcgregor  Sheran Lawless, PT 04/10/2020, 4:33 PM

## 2020-04-10 NOTE — Progress Notes (Signed)
Patient Details  Name: Shannon [name] MRN: [id number] Date of Birth: 02/10/1946  Today's Date: 04/10/2020  Hospital Problems: Principal Problem:   Cervical myelopathy [facility])  Past Medical History:  Past Medical History:  Diagnosis Date  . Asthma   . 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 Newell Coral, 1992-->surg at [facility].  Summer 2021->Cervicalgia,arm pain, hand atrophy RUE wkness, hyperreflex (Dr. 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 Hay fever   . Hypertension   . Osteoarthritis of both hands    Past Surgical History:  Past Surgical History:  Procedure Laterality Date  . ANTERIOR CERVICAL DECOMP/DISCECTOMY FUSION N/A 04/05/2020   Procedure: ANTERIOR CERVICAL DECOMPRESSION/DISCECTOMY FUSION, INTERBODY PROSTHESIS, PLATE/SCREWS CERVICAL THREE-CERVICAL FOUR, CERVICAL FOUR- CERVICAL FIVE;  Surgeon: Tressie Stalker, MD;  Location: [facility] OR;  Service: Neurosurgery;  Laterality: N/A;  . BACK SURGERY    . INCISION AND DRAINAGE Left 2012   L hand infection  . ROTATOR CUFF REPAIR Right    x 3  . SPINE SURGERY     Social History:  reports that he has quit smoking. He has never used smokeless tobacco. He reports previous alcohol use. He reports that he does not use drugs.  Family / Support Systems Marital Status: Divorced Patient Roles: Parent Children: [name]-daughter [phone] Other Supports: Shannon-friend [phone] Anticipated Caregiver: Daughter Ability/Limitations of Caregiver: Daughter has taken off work to be there with Dad to [employer] by Louann Sjogren can take off as much time is needed Caregiver Availability: 24/7 Family Dynamics: Close knit with his daughter and his two grandhildren-18 & 34 yo. Daughter lives in [place] with her children. Pt has friends who are local and will check on him but will not provide physical  care  Social History Preferred language: English Religion: Non-Denominational Cultural Background: No issues Education: HS Read: Yes Write: Yes Employment Status: Retired Date Retired/Disabled/Unemployed: Magazine features editor Issues: No issues Guardian/Conservator: None-according to MD pt is capable of making his own decisions while here   Abuse/Neglect Abuse/Neglect Assessment Can Be Completed: Yes Physical Abuse: Denies Verbal Abuse: Denies Sexual Abuse: Denies Exploitation of patient/patient's resources: Denies Self-Neglect: Denies  Emotional Status Pt's affect, behavior and adjustment status: Pt is motivated to do well and improve. he has a great attitude and is starting where he is now after surgery. He had hoped he would be better after surgery. But states: " It is what it is and will go from here." Recent Psychosocial Issues: other health issues-balance issues Psychiatric History: No issues/history deferred depression screen due to coping appropriately but do feel would benefit from seeing neuro-psych while here. Substance Abuse History: No issues  Patient / Family Perceptions, Expectations & Goals Pt/Family understanding of illness & functional limitations: Pt and daughte rhave a good understanding of his surgery and deficits from the surgery. He is glad to be here on CIR and hopes he gets better. He talks with the MD daily and feels he has a good understanding of his treatment plan going forwrd. 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, children's, grandfather, retiree, friend, church member, etc Anticipated changes in roles/activities/participation: resume Pt/family expectations/goals: Pt states: " I will hope for the best but will be realistic too."  Daughter states: " I hope he can get his movement back he was so independent before this."  [employer]: None Premorbid Home Care/DME Agencies: None Transportation available at discharge:  Self and now  will rely on daughter and freinds Resource referrals recommended: Neuropsychology  Discharge Planning Living Arrangements: Alone Support Systems: Children, Other relatives, Friends/neighbors, Church/faith community Type of Residence: Private residence Insurance Resources: [employer], Media planner (specify) Psychologist, clinical) Financial Resources: Social Security, Other (Comment) (pension) Financial Screen Referred: No Living Expenses: Own Money Management: Patient Does the patient have any problems obtaining your medications?: No Home Management: Patient will need assistance now until able to recover and do again Patient/Family Preliminary Plans: Return home with daughter assisting for a short time. Both are hopeful he will improve and get his movement back. Pt is willing to adapt and make adjustments to be independent again. Awar eof the process and team conference tomorrrow. Will work on safe discharge plan. Care Coordinator Anticipated Follow Up Needs: HH/OP  Clinical Impression Very pleasant gentleman who is motivated and ready to work hard in therapies. His daughter has taken time off work to be able to assist him in his care. Will work on discharge needs for pt and provide support. Neuro-psych to see while here.  Lucy Chris 04/10/2020, 9:51 AM

## 2020-04-10 NOTE — Progress Notes (Signed)
Jamse Arn, MD  Physician  Physical Medicine and Rehabilitation  PMR Pre-admission      Signed  Date of Service:  04/07/2020 12:19 PM      Related encounter: Admission (Discharged) from 04/05/2020 in [place]      Signed       Show:Clear all _0 Manual_1 Template_2 Copied  Added by: _3 Retta Diones, RN_4 Jamse Arn, MD  _5 Hover for details PMR Admission Coordinator Pre-Admission Assessment   Patient: Shannon [name] is an 74 y.o., male MRN: [id number] DOB: [birth year] Height: _6  (177.8 cm) Weight: 90.9 kg   Insurance Information HMO:    PPO:      PCP:      IPA:      80/20:      OTHER:  PRIMARY: Medicare A and B      Policy#: [id number]      Subscriber: patient CM Name:        Phone#:       Fax#:   Pre-Cert#:        Employer: Retired from post office Benefits:  Phone #:       Name:  Checked in Dentist and called secondary insurance to verify [place]. Date: A=03/25/11 and B=06/25/11     Deduct: $1484      Out of Pocket Max: None      Life Max: N/A CIR: 100%      SNF: 100 days Outpatient: 80%     Co-Pay: 20% Home Health: 100%      Co-Pay: none DME: 80%     Co-Pay: 20% Providers: patient's choice  SECONDARY: GEHA      Policy#: [id number] geha     Phone#: [phone]   Financial Counselor:        Phone#:     The "Data Collection Information Summary" for patients in Inpatient Rehabilitation Facilities with attached "Privacy Act [place] Records" was provided and verbally reviewed with: Patient and Family   Emergency Contact Information         Contact Information     Name Relation Home Work Mobile    Darden,[name] Daughter     [phone]    [name] [phone]   [phone]    [name] Niece     [phone]         Current Medical History  Patient Admitting Diagnosis: Cervical myelopathy   History of Present Illness: a 74 year old right-handed male with history of hypertension,  asthma as well as Chiari I malformation,syringomyelia.  Per chart review patient lives alone.  1 level home 5 steps to entry.  Reports ambulating without assistive device.  Presented 04/05/2020 with complaints of new left arm pain and numbness chronic arm pain on the right with numbness tingling weakness as well as unsteady gait difficulty using his hands with decreased coordination.  Admission chemistries unremarkable except glucose of 120, hemoglobin 14.9.  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  Underwent C3-4 C4-5 anterior cervical discectomy decompression interbody arthrodesis 04/05/2020 per Dr. Newman Pies.  Cervical brace as directed.  Tolerating a regular diet.  Therapy evaluations completed and patient was admitted for a comprehensive rehab program.   Patient's medical record from [facility] has been reviewed by the rehabilitation admission coordinator and physician.   Past Medical History      Past Medical History:  Diagnosis Date  . Asthma    . Chiari I malformation ([place])      with assoc syringomyelia.  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 Sherwood Gambler, 1992-->[facility].  Summer 2021->Cervicalgia,arm pain, hand atrophy RUE wkness, hyperreflex (Dr. 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 Hay fever    . Hypertension    . Osteoarthritis of both hands        Family History   family history includes Alcohol abuse in his father; Cancer in his father; Diabetes in his father; Heart attack in his mother; Heart disease in his mother; High blood pressure in his father and mother.   Prior Rehab/Hospitalizations Has the patient had prior rehab or hospitalizations prior to admission? No   Has the patient had major surgery during 100 days prior to admission? Yes              Current Medications   Current Facility-Administered  Medications:  .  acetaminophen (TYLENOL) tablet 650 mg, 650 mg, Oral, Q4H PRN **OR** acetaminophen (TYLENOL) suppository 650 mg, 650 mg, Rectal, Q4H PRN, Newman Pies, MD .  alum & mag hydroxide-simeth (MAALOX/MYLANTA) 200-200-20 MG/5ML suspension 30 mL, 30 mL, Oral, Q6H PRN, Newman Pies, MD .  amLODipine (NORVASC) tablet 10 mg, 10 mg, Oral, Daily, Newman Pies, MD, 10 mg at 04/07/20 1054 .  bisacodyl (DULCOLAX) suppository 10 mg, 10 mg, Rectal, Daily PRN, Newman Pies, MD .  cyclobenzaprine (FLEXERIL) tablet 10 mg, 10 mg, Oral, TID PRN, Newman Pies, MD .  docusate sodium (COLACE) capsule 100 mg, 100 mg, Oral, BID, Newman Pies, MD, 100 mg at 04/07/20 1054 .  lactated ringers infusion, , Intravenous, Continuous, Newman Pies, MD .  menthol-cetylpyridinium (CEPACOL) lozenge 3 mg, 1 lozenge, Oral, PRN **OR** phenol (CHLORASEPTIC) mouth spray 1 spray, 1 spray, Mouth/Throat, PRN, Newman Pies, MD .  morphine 4 MG/ML injection 4 mg, 4 mg, Intravenous, Q2H PRN, Newman Pies, MD .  ondansetron [facility]) tablet 4 mg, 4 mg, Oral, Q6H PRN **OR** ondansetron (ZOFRAN) injection 4 mg, 4 mg, Intravenous, Q6H PRN, Newman Pies, MD .  oxyCODONE (Oxy IR/ROXICODONE) immediate release tablet 10 mg, 10 mg, Oral, Q3H PRN, Newman Pies, MD .  oxyCODONE (Oxy IR/ROXICODONE) immediate release tablet 5 mg, 5 mg, Oral, Q3H PRN, Newman Pies, MD .  pantoprazole (PROTONIX) EC tablet 40 mg, 40 mg, Oral, QHS, Newman Pies, MD, 40 mg at 04/06/20 2013   Patients Current Diet:     Diet Order                      Diet regular Room service appropriate? Yes; Fluid consistency: Thin  Diet effective now                      Precautions / Restrictions Precautions Precautions: Cervical Precaution Booklet Issued: Yes (comment) Cervical Brace: Hard collar, Other (comment) Restrictions Weight Bearing Restrictions: No    Has the patient had 2 or more falls or a fall with  injury in the past year? Yes   Prior Activity Level Community (5-7x/wk): Went out daily, was independent and driving.   Prior Functional Level Self Care: Did the patient need help bathing, dressing, using the toilet or eating? Independent   Indoor Mobility: Did the patient need assistance with walking from room to room (with or without device)? Independent   Stairs: Did the patient need assistance with internal or external stairs (with or without device)? Independent   Functional Cognition: Did the patient need help planning regular tasks such as shopping or remembering  to take medications? Independent   Home Assistive Devices / Equipment Home Assistive Devices/Equipment: None, Cane (specify quad or straight) Home Equipment: None   Prior Device Use: Indicate devices/aids used by the patient prior to current illness, exacerbation or injury? None of the above   Current Functional Level Cognition   Overall Cognitive Status: Impaired/Different from baseline Orientation Level: Oriented X4 Safety/Judgement: Decreased awareness of safety, Decreased awareness of deficits General Comments: Pt discussing his deficits as they were prior to surgery and does not seem to be fully understanding his current level of function.     Extremity Assessment (includes Sensation/Coordination)   Upper Extremity Assessment: Defer to OT evaluation (Defer to OT for details but demonstrating significant weaknesses in L UE (particularly shoulder and elbow); also R UE weakness but not as severe) RUE Deficits / Details: Activation of scapular musculature. Weak shoulder protraction/retraction. Patient also able to make weak fist. Patient is unable to actively flex shoulder, elbow, or wrist.  RUE Sensation: decreased light touch RUE Coordination: decreased fine motor, decreased gross motor LUE Deficits / Details: Activation of scapular musculature. Weak shoulder protraction/retraction. Patient also able to make weak  fist. Patient is unable to actively flex shoulder, elbow, or wrist.  LUE Sensation: decreased light touch LUE Coordination: decreased fine motor, decreased gross motor  Lower Extremity Assessment: LLE deficits/detail, RLE deficits/detail RLE Deficits / Details: ROM WFL; MMT 5/5 RLE Sensation: WNL LLE Deficits / Details: ROM WFL; MMT 5/5 LLE Sensation: WNL     ADLs   Overall ADL's : Needs assistance/impaired Eating/Feeding: Maximal assistance Eating/Feeding Details (indicate cue type and reason): reports the foam over utensil handles has been very helpful for grasp however, rom to bring ue to mouth remains a great challenge Grooming: Maximal assistance Grooming Details (indicate cue type and reason): Patient required Max A to wash hands standing at sink level with need for hand over hand assist to manage faucet and lather soap between hands. Patient also required assist to grab paper towels to dry hands  Lower Body Dressing: Maximal assistance, Sitting/lateral leans, Sit to/from stand Lower Body Dressing Details (indicate cue type and reason): Max A to doff/don socks. reports he uses pliers at home to aide in pulling due to grasp issues Toilet Transfer: Min guard, Regular Glass blower/designer Details (indicate cue type and reason): Patient met seated on commode. Min guard for sit to stand from low commode. Patient unable to functionally use BUE to assist. Dependent on rocking technique to boost up from sitting.   Toileting- Clothing Manipulation and Hygiene: Maximal assistance Toileting - Clothing Manipulation Details (indicate cue type and reason): Patient would likely require Max A for hygiene/clothing management.  Functional mobility during ADLs: Min guard General ADL Comments: pt. is typically R hand dominant but now relies on use of L hand.  states he can not bring lue to mouth or head.  had to hold the phone for him when someone called him.  at home states he utilizes speaker function  because he can not hold the phone.     Mobility   Overal bed mobility: Needs Assistance Bed Mobility: Rolling, Sidelying to Sit, Sit to Sidelying Rolling: Min assist Sidelying to sit: Min assist Sit to supine: Min assist Sit to sidelying: Min assist General bed mobility comments: sitting in chair and beginning and end of session     Transfers   Overall transfer level: Needs assistance Equipment used: 1 person hand held assist Transfers: Sit to/from Stand, Stand Pivot Transfers Sit to  Stand: Min guard, Min assist General transfer comment: cues to maintain precautions. pt. verbalizes understanding but had difficulty transitioniong from sit/stand. reviewed with him PT reported concerns with speed and gait, fall risk. he states it wasnt something with him it was that the socks were too big and moving around and he was over cautious about tripping. also states he has trouble with "what do you call that..oh yeah multi taksing". cued not to talk during ambulation or during tasks so he can focus     Ambulation / Gait / Stairs / Wheelchair Mobility   Ambulation/Gait Ambulation/Gait assistance: Herbalist (Feet): 200 Feet Assistive device: 1 person hand held assist, None Gait Pattern/deviations: Step-through pattern, Narrow base of support, Scissoring, Decreased step length - right General Gait Details: Pt with occasional scissoring and frequent narrow BOS. Pt declined RW use as he states it is hard to grip and maneuver. Forward momentum noted throughout gait training and quick gait, concerning for anterior LOB. Occasional assist required throughout for unsteadiness and lateral LOB.  Gait velocity: decreased Gait velocity interpretation: 1.31 - 2.62 ft/sec, indicative of limited community ambulator Stairs: Yes Stairs assistance: Min assist Stair Management: One rail Right, Step to pattern, Forwards Number of Stairs: 6 General stair comments: Pt held to R railing with both hands -  LUE supinated and holding to rail from underneath. Pt having difficulty gripping the rail for descent as he had to hold railing in a pronated position. negotiation appeared more unsteady in descent due to this.      Posture / Balance Balance Overall balance assessment: Needs assistance Sitting-balance support: No upper extremity supported Sitting balance-Leahy Scale: Fair Standing balance support: Single extremity supported, Bilateral upper extremity supported, No upper extremity supported Standing balance-Leahy Scale: Poor Standing balance comment: Required at least single UE support or leaning on counter     Special needs/care consideration Wound Care: Neck dressing in place and C-collar in place and Designated visitor Daughter today    Previous Home Environment (from acute therapy documentation) Living Arrangements: Alone Available Help at Discharge: Family, Friend(s), Available PRN/intermittently Type of Home: House Home Layout: One level Home Access: Stairs to enter Entrance Stairs-Rails: Right Entrance Stairs-Number of Steps: 5 Bathroom Shower/Tub: Chiropodist: [place]: No   Discharge Living Setting Plans for Discharge Living Setting: Patient's home, Alone, House (Dtr plans to stay with patient after DC up to 21 days) Type of Home at Discharge: House Discharge Home Layout: One level, Able to live on main level with bedroom/bathroom Discharge Home Access: Stairs to enter Entrance Stairs-Rails: Right Entrance Stairs-Number of Steps: 5-6 Discharge Bathroom Shower/Tub: Tub/shower unit, Curtain Discharge Bathroom Toilet: Handicapped height Discharge Bathroom Accessibility: Yes How Accessible: Accessible via walker Does the patient have any problems obtaining your medications?: No   Social/Family/Support Systems Patient Roles: Parent (Has a daughter and friends.) Contact Information: Zeb Rawl - daughter - [phone] Anticipated  Caregiver: Daughter Ability/Limitations of Caregiver: Dtr works but can take time off as needed up to a total of 21 days Caregiver Availability: 24/7 Discharge Plan Discussed with Primary Caregiver: Yes (Met with dtr and patient) Is Caregiver In Agreement with Plan?: Yes Does Caregiver/Family have Issues with Lodging/Transportation while Pt is in Rehab?: No   Goals Patient/Family Goal for Rehab: PT/OT mod I and S goals Expected length of stay: 5-7 days Cultural Considerations: None Pt/Family Agrees to Admission and willing to participate: Yes Program Orientation Provided & Reviewed with Pt/Caregiver Including Roles  & Responsibilities:  Yes   Decrease burden of Care through IP rehab admission: N/A   Possible need for SNF placement upon discharge: Not anticipated   Patient Condition: I have reviewed medical records from [facility], spoken with assigned RN on unit and CM, and patient and daughter. I met with patient at the bedside for inpatient rehabilitation assessment.  Patient will benefit from ongoing PT and OT, can actively participate in 3 hours of therapy a day 5 days of the week, and can make measurable gains during the admission.  Patient will also benefit from the coordinated team approach during an Inpatient Acute Rehabilitation admission.  The patient will receive intensive therapy as well as Rehabilitation physician, nursing, social worker, and care management interventions.  Due to bladder management, bowel management, safety, skin/wound care, disease management, medication administration, pain management and patient education the patient requires 24 hour a day rehabilitation nursing.  The patient is currently Min assist with mobility and max assist with basic ADLs.  Discharge setting and therapy post discharge at home with home health is anticipated.  Patient has agreed to participate in the Acute Inpatient Rehabilitation Program and will admit today.   Preadmission Screen Completed  By:  Retta Diones, 04/07/2020 12:19 PM ______________________________________________________________________   Discussed status with Dr. Posey Pronto and Dr. Ranell Patrick on 04/07/20 at 10:00 and received approval for admission today.   Admission Coordinator:  Retta Diones, RN, time 12:32/Date 04/07/20    Assessment/Plan: Diagnosis: Cervical myelopathy with quadraparesis   1. Does the need for close, 24 hr/day Medical supervision in concert with the patient's rehab needs make it unreasonable for this patient to be served in a less intensive setting? Yes 2. Co-Morbidities requiring supervision/potential complications: HTN (monitor and provide prns in accordance with increased physical exertion and pain), asthma, Chiari I malformation, syringomyelia, post-op pain (Biofeedback training with therapies to help reduce reliance on opiate pain medications, monitor pain control during therapies, and sedation at rest and titrate to maximum efficacy to ensure participation and gains in therapies) 3. Due to bladder management, bowel management, safety, skin/wound care, disease management, pain management and patient education, does the patient require 24 hr/day rehab nursing? Yes 4. Does the patient require coordinated care of a physician, rehab nurse, PT, OT to address physical and functional deficits in the context of the above medical diagnosis(es)? Yes Addressing deficits in the following areas: balance, endurance, locomotion, strength, transferring, bathing, dressing, grooming, toileting and psychosocial support 5. Can the patient actively participate in an intensive therapy program of at least 3 hrs of therapy 5 days a week? Yes 6. The potential for patient to make measurable gains while on inpatient rehab is excellent 7. Anticipated functional outcomes upon discharge from inpatient rehab: modified independent and supervision PT, modified independent and supervision OT, n/a SLP 8. Estimated rehab length of  stay to reach the above functional goals is: 12-16 days. 9. Anticipated discharge destination: Home 10. Overall Rehab/Functional Prognosis: excellent     MD Signature: Delice Lesch, MD, ABPMR        Revision History                     Note Details  Author Jamse Arn, MD File Time 04/07/2020 12:51 PM  Author Type Physician Status Signed  Last Editor Jamse Arn, MD Service Physical Medicine and [place] # [id number] Admit Date 04/07/2020

## 2020-04-10 NOTE — Progress Notes (Signed)
Physical Therapy Session Note  Patient Details  Name: Shannon [name] MRN: [id number] Date of Birth: [birth year]  Today's Date: 04/10/2020 PT Individual Time: 8372-9021 PT Individual Time Calculation (min): 40 min   Short Term Goals: Week 1:  PT Short Term Goal 1 (Week 1): Pt will perform supine<>sit with CGA PT Short Term Goal 2 (Week 1): Pt will perform sit<>stands with CGA PT Short Term Goal 3 (Week 1): Pt will perforrm bed<>chair transfers with CGA PT Short Term Goal 4 (Week 1): Pt will ambulate at least 12ft using LRAD with CGA PT Short Term Goal 5 (Week 1): Pt will ascend/descend 8 steps using R HR with CGA  Skilled Therapeutic Interventions/Progress Updates:    PAIn 5/10 mid thoracic region,  Rest as needed  Pt initially supine on mat, handed off from last PT.  Pt wearing Aspen collar.   Supine to sit w/supervision, additional time.  staic sit w/supervision.  STS w/CGA. Static stand w/cga, mild ant lean which pt can correct w/cues but difficulty maintaining upright posture. Static stand w/feet together cga and progressively increased flexion, ant l lean equiring min assist.  Used mirror for feedback, verbal cues To work on Scientist, product/process development stand w/feet in semitandem, progressive ant/L lean, cues to correct, min assist Sidestepping L/R length of mat x 2 w/cga to min assist, cues for upright Step up to foam mat w/mod assist, pt w/increased L/ant lean, cga initially but quickly progressed to min/light mod.  Backing off of foam max assist due to post LOB, assisted safely to sitting on mat.   Partial sqats x 8 for LE strengthening  SPT mat to wc w/cga wc propulsion w/bilat LEs x 168ft for general strengthening.  Pt left oob in wc w/alarm belt set and needs in reach  Therapy Documentation Precautions:  Precautions Precautions: Cervical Precaution Booklet Issued: Yes (comment) Required Braces or Orthoses: Cervical Brace Cervical Brace: Hard collar, Other  (comment) (don in sitting) Restrictions Weight Bearing Restrictions: No    Therapy/Group: Individual Therapy  Rada Hay, PT   [name] 04/10/2020, 3:04 PM

## 2020-04-10 NOTE — IPOC Note (Signed)
Overall Plan of Care [facility]) Patient Details Name: Shannon [name] MRN: [id number] DOB: Sep 18, 1945  Admitting Diagnosis: Cervical myelopathy [facility])  Hospital Problems: Principal Problem:   Cervical myelopathy (HCC)     Functional Problem List: Nursing Motor, Endurance, Skin Integrity, Safety  PT Balance, Perception, Behavior, Safety, Edema, Sensory, Endurance, Skin Integrity, Motor, Nutrition, Pain  OT Balance, Endurance, Motor  SLP    TR         Basic ADL's: OT Eating, Grooming, Bathing, Dressing, Toileting     Advanced  ADL's: OT       Transfers: PT Bed Mobility, Bed to Chair, Car, [employer], Floor  OT Toilet     Locomotion: PT Ambulation, Stairs     Additional Impairments: OT Fuctional Use of Upper Extremity  SLP        TR      Anticipated Outcomes Item Anticipated Outcome  Self Feeding mod A  Swallowing      Basic self-care  mod A  Toileting  min A   Bathroom Transfers supervision to toilet  Bowel/Bladder  remain continent of bowel and bladder with mod i assist  Transfers  supervision  Locomotion  supervision  Communication     Cognition     Pain  remain free of pain  Safety/Judgment  remain free of injury, prevent falls with mod i assist   Therapy Plan: PT Intensity: Minimum of 1-2 x/day ,45 to 90 minutes PT Frequency: 5 out of 7 days PT Duration Estimated Length of Stay: 7-10 days OT Intensity: Minimum of 1-2 x/day, 45 to 90 minutes OT Frequency: 5 out of 7 days OT Duration/Estimated Length of Stay: 7-9 days     Due to the current state of emergency, patients may not be receiving their 3-hours of Medicare-mandated therapy.   Team Interventions: Nursing Interventions Patient/Family Education, Skin Care/Wound Management, Discharge Planning, Pain Management, Medication Management, Psychosocial Support, Disease Management/Prevention  PT interventions Ambulation/gait training, Community reintegration, DME/adaptive equipment instruction,  Psychosocial support, Neuromuscular re-education, Stair training, UE/LE Strength taining/ROM, Warden/ranger, Discharge planning, Functional electrical stimulation, Pain management, Skin care/wound management, Therapeutic Activities, UE/LE Coordination activities, Cognitive remediation/compensation, Disease management/prevention, Functional mobility training, Patient/family education, Splinting/orthotics, Therapeutic Exercise, Visual/perceptual remediation/compensation  OT Interventions Balance/vestibular training, Discharge planning, Functional mobility training, DME/adaptive equipment instruction, Neuromuscular re-education, Patient/family education, Psychosocial support, Therapeutic Activities, Self Care/advanced ADL retraining, Therapeutic Exercise, UE/LE Strength taining/ROM, UE/LE Coordination activities  SLP Interventions    TR Interventions    SW/CM Interventions Discharge Planning, Psychosocial Support, Patient/Family Education   Barriers to Discharge MD  Medical stability  Nursing      PT Inaccessible home environment, Decreased caregiver support, Home environment access/layout, Lack of/limited family support    OT Decreased caregiver support    SLP      SW       Team Discharge Planning: Destination: PT-Home ,OT- Home , SLP-  Projected Follow-up: PT-Home health PT, 24 hour supervision/assistance, OT-  Home health OT, SLP-  Projected Equipment Needs: PT-To be determined, OT- Tub/shower seat, 3 in 1 bedside comode, SLP-  Equipment Details: PT- , OT-  Patient/family involved in discharge planning: PT- Patient,  OT-Patient, SLP-   MD ELOS: 12-16 days S-ModI Medical Rehab Prognosis:  Excellent Assessment: [name] is a 74 year old man who is admitted to CIR with quadriparesis secondary to cervical radiculopathy with myelopathy as well as history of Chiari malformation status post C3-4 4-5 anterior cervical discectomy and decompression 04/05/2020. Pain has been well  controlled with Oxycodone and  Flexeril prn. BP has been well controlled on Norvasc. 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 He is on a bowel program for constipation. Labs are being monitored weekly given acute blood loss anemia.     See Team Conference Notes for weekly updates to the plan of care

## 2020-04-10 NOTE — Progress Notes (Signed)
Inpatient Rehabilitation Center Individual Statement of Services  Patient Name:  Shannon [name]  Date:  04/10/2020  Welcome to the Inpatient Rehabilitation Center.  Our goal is to provide you with an individualized program based on your diagnosis and situation, designed to meet your specific needs.  With this comprehensive rehabilitation program, you will be expected to participate in at least 3 hours of rehabilitation therapies Monday-Friday, with modified therapy programming on the weekends.  Your rehabilitation program will include the following services:  Physical Therapy (PT), Occupational Therapy (OT), 24 hour per day rehabilitation nursing, Neuropsychology, Care Coordinator, Rehabilitation Medicine, Nutrition Services and Pharmacy Services  Weekly team conferences will be held on Tuesday to discuss your progress.  Your Inpatient Rehabilitation Care Coordinator will talk with you frequently to get your input and to update you on team discussions.  Team conferences with you and your family in attendance may also be held.  Expected length of stay: 7-9 days  Overall anticipated outcome: Min-Mod-OT and Supervision-PT  Depending on your progress and recovery, your program may change. Your Inpatient Rehabilitation Care Coordinator will coordinate services and will keep you informed of any changes. Your Inpatient Rehabilitation Care Coordinator's name and contact numbers are listed  below.  The following services may also be recommended but are not provided by the Inpatient Rehabilitation Center:   Driving Evaluations  Home Health Rehabiltiation Services  Outpatient Rehabilitation Services    Arrangements will be made to provide these services after discharge if needed.  Arrangements include referral to agencies that provide these services.  Your insurance has been verified to be:  Medicare & GEHA Your primary doctor is:  Earley Favor  Pertinent information will be shared with your  doctor and your insurance company.  Inpatient Rehabilitation Care Coordinator:  Dossie Der, [place] [phone] or [phone]  Information discussed with and copy given to patient by: Lucy Chris, 04/10/2020, 9:30 AM

## 2020-04-10 NOTE — Progress Notes (Signed)
Inpatient Rehabilitation  Patient information reviewed and entered into eRehab system by Aamari Strawderman M. Markeise Mathews, M.A., CCC/SLP, PPS Coordinator.  Information including medical coding, functional ability and quality indicators will be reviewed and updated through discharge.    

## 2020-04-10 NOTE — Progress Notes (Addendum)
Occupational Therapy Session Note  Patient Details  Name: Shannon [name] MRN: [id number] Date of Birth: 1946-06-22  Today's Date: 04/10/2020 OT Individual Time: 1100-1201 and 1450-1533 OT Individual Time Calculation (min): 61 min and 43 min  Short Term Goals: Week 1:  OT Short Term Goal 1 (Week 1): STGs = LTGs  Skilled Therapeutic Interventions/Progress Updates:    Pt greeted in the recliner with c/o chronic pain however he reported this was manageable for tx without additional interventions. Since ADL needs were met, started session with NMR for the UEs. He exhibits proximal>distal weakness. Worked on weightbearing through palms when standing at elevated bed, weight shifting forward and returning to neutral x15 reps 3 sets. Transitioning from elbows to palms Lt side first and also modified towel slides using the chuck pad with mod facilitation. CGA for standing balance. Pt then requested to sit down to rest. Continued working on UE NMR with focus on the Lt UE via peg board task. Pt requiring mod facilitation at elbow and wrist. Increased time to meet FM demands of task, pt requiring 4 rest breaks in order to complete 2 puzzle designs. At end of session pt remained sitting in the recliner with all needs within reach and safety belt fastened.   2nd Session 1:1 tx (43 min) Pt greeted via PT handoff. ADL needs met, agreeable to go outside for his birthday treat. Pt escorted via w/c to the outdoor patio for some sunshine. He then completed a short distance ambulatory transfer without AD and Min A to a bench. Worked on A/AAROM of the UEs while sitting, gentle external rotation stretches completed bilaterally. He completed another ambulatory transfer to an adjacent bench ~25 ft away in the manner as written above, and then he returned to his w/c, placed ~25 ft away beside the first bench. When he was escorted back to the room, pt ambulated to EOB and then reported he needed to urinate. Ambulatory transfer  to toilet completed without AD and CGA-Min A. Pt left in care of NT to resume void.   Therapy Documentation Precautions:  Precautions Precautions: Cervical Precaution Booklet Issued: Yes (comment) Required Braces or Orthoses: Cervical Brace Cervical Brace: Hard collar, Other (comment) (don in sitting) Restrictions Weight Bearing Restrictions: No ADL: ADL Eating: Dependent Grooming: Dependent Upper Body Bathing: Moderate assistance Where Assessed-Upper Body Bathing: Edge of bed Lower Body Bathing: Moderate assistance Where Assessed-Lower Body Bathing: Edge of bed Upper Body Dressing: Moderate assistance Where Assessed-Upper Body Dressing: Edge of bed Lower Body Dressing: Maximal assistance Where Assessed-Lower Body Dressing: Edge of bed Toileting: Maximal assistance Where Assessed-Toileting: Glass blower/designer: Psychiatric nurse Method: Counselling psychologist: Raised toilet seat      Therapy/Group: Individual Therapy  Ramal Eckhardt A Anyssa Sharpless 04/10/2020, 12:19 PM

## 2020-04-11 ENCOUNTER — Inpatient Hospital Stay (HOSPITAL_COMMUNITY): Payer: Medicare Other | Admitting: Occupational Therapy

## 2020-04-11 ENCOUNTER — Inpatient Hospital Stay (HOSPITAL_COMMUNITY): Payer: Medicare Other

## 2020-04-11 ENCOUNTER — Encounter (HOSPITAL_COMMUNITY): Payer: Medicare Other | Admitting: Psychology

## 2020-04-11 DIAGNOSIS — G959 Disease of spinal cord, unspecified: Secondary | ICD-10-CM | POA: Diagnosis not present

## 2020-04-11 NOTE — Progress Notes (Signed)
Patel, Ankit Anil, MD  Physician  Physical Medicine and Rehabilitation  PMR Pre-admission      Signed  Date of Service:  04/07/2020 12:19 PM      Related encounter: Admission (Discharged) from 04/05/2020 in [facility]      Signed       Show:Clear all [x]Manual[x]Template[x]Copied  Added by: [x]Rejoice Heatwole M, RN[x]Patel, Ankit Anil, MD  []Hover for details PMR Admission Coordinator Pre-Admission Assessment   Patient: Shannon [name] is an 73 y.o., male MRN: [id number] DOB: 06/16/1946 Height: 5' 10" (177.8 cm) Weight: 90.9 kg   Insurance Information HMO:    PPO:      PCP:      IPA:      80/20:      OTHER:  PRIMARY: Medicare A and B      Policy#: [id number]      Subscriber: patient CM Name:        Phone#:       Fax#:   Pre-Cert#:        Employer: Retired from post office Benefits:  Phone #:       Name:  Checked in Epic and called secondary insurance to verify Eff. Date: A=03/25/11 and B=06/25/11     Deduct: $1484      Out of Pocket Max: None      Life Max: N/A CIR: 100%      SNF: 100 days Outpatient: 80%     Co-Pay: 20% Home Health: 100%      Co-Pay: none DME: 80%     Co-Pay: 20% Providers: patient's choice  SECONDARY: GEHA      Policy#: [id number]     Phone#: [phone]   Financial Counselor:        Phone#:     The "Data Collection Information Summary" for patients in Inpatient Rehabilitation Facilities with attached "Privacy Act Statement-Health Care Records" was provided and verbally reviewed with: Patient and Family   Emergency Contact Information         Contact Information     Name Relation Home Work Mobile    Werts,[name] Daughter     [phone]    Neal,Shannon Friend [phone]   [phone]    agard, gloria Niece     [phone]         Current Medical History  Patient Admitting Diagnosis: Cervical myelopathy   History of Present Illness: a 73-year-old right-handed male with history of hypertension,  asthma as well as Chiari I malformation,syringomyelia.  Per chart review patient lives alone.  1 level home 5 steps to entry.  Reports ambulating without assistive device.  Presented 04/05/2020 with complaints of new left arm pain and numbness chronic arm pain on the right with numbness tingling weakness as well as unsteady gait difficulty using his hands with decreased coordination.  Admission chemistries unremarkable except glucose of 120, hemoglobin 14.9.  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  Underwent C3-4 C4-5 anterior cervical discectomy decompression interbody arthrodesis 04/05/2020 per Dr. Jeffrey Jenkins.  Cervical brace as directed.  Tolerating a regular diet.  Therapy evaluations completed and patient was admitted for a comprehensive rehab program.   Patient's medical record from [place] has been reviewed by the rehabilitation admission coordinator and physician.   Past Medical History      Past Medical History:  Diagnosis Date  . Asthma    . 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 Nudelman, 1992-->[facility].  Summer 2021->Cervicalgia,arm pain, hand atrophy RUE wkness, hyperreflex (Dr. Jenkins)-cerv MRI: extensive cord atrophy and spinal and foraminal stenosis->to get surgery 03/2020  . Hay fever    . Hypertension    . Osteoarthritis of both hands        Family History   family history includes Alcohol abuse in his father; Cancer in his father; Diabetes in his father; Heart attack in his mother; Heart disease in his mother; High blood pressure in his father and mother.   Prior Rehab/Hospitalizations Has the patient had prior rehab or hospitalizations prior to admission? No   Has the patient had major surgery during 100 days prior to admission? Yes              Current Medications   Current Facility-Administered  Medications:  .  acetaminophen (TYLENOL) tablet 650 mg, 650 mg, Oral, Q4H PRN **OR** acetaminophen (TYLENOL) suppository 650 mg, 650 mg, Rectal, Q4H PRN, Jenkins, Jeffrey, MD .  alum & mag hydroxide-simeth (MAALOX/MYLANTA) 200-200-20 MG/5ML suspension 30 mL, 30 mL, Oral, Q6H PRN, Jenkins, Jeffrey, MD .  amLODipine (NORVASC) tablet 10 mg, 10 mg, Oral, Daily, Jenkins, Jeffrey, MD, 10 mg at 04/07/20 1054 .  bisacodyl (DULCOLAX) suppository 10 mg, 10 mg, Rectal, Daily PRN, Jenkins, Jeffrey, MD .  cyclobenzaprine (FLEXERIL) tablet 10 mg, 10 mg, Oral, TID PRN, Jenkins, Jeffrey, MD .  docusate sodium (COLACE) capsule 100 mg, 100 mg, Oral, BID, Jenkins, Jeffrey, MD, 100 mg at 04/07/20 1054 .  lactated ringers infusion, , Intravenous, Continuous, Jenkins, Jeffrey, MD .  menthol-cetylpyridinium (CEPACOL) lozenge 3 mg, 1 lozenge, Oral, PRN **OR** phenol (CHLORASEPTIC) mouth spray 1 spray, 1 spray, Mouth/Throat, PRN, Jenkins, Jeffrey, MD .  morphine 4 MG/ML injection 4 mg, 4 mg, Intravenous, Q2H PRN, Jenkins, Jeffrey, MD .  ondansetron (ZOFRAN) tablet 4 mg, 4 mg, Oral, Q6H PRN **OR** ondansetron (ZOFRAN) injection 4 mg, 4 mg, Intravenous, Q6H PRN, Jenkins, Jeffrey, MD .  oxyCODONE (Oxy IR/ROXICODONE) immediate release tablet 10 mg, 10 mg, Oral, Q3H PRN, Jenkins, Jeffrey, MD .  oxyCODONE (Oxy IR/ROXICODONE) immediate release tablet 5 mg, 5 mg, Oral, Q3H PRN, Jenkins, Jeffrey, MD .  pantoprazole (PROTONIX) EC tablet 40 mg, 40 mg, Oral, QHS, Jenkins, Jeffrey, MD, 40 mg at 04/06/20 2013   Patients Current Diet:     Diet Order                      Diet regular Room service appropriate? Yes; Fluid consistency: Thin  Diet effective now                      Precautions / Restrictions Precautions Precautions: Cervical Precaution Booklet Issued: Yes (comment) Cervical Brace: Hard collar, Other (comment) Restrictions Weight Bearing Restrictions: No    Has the patient had 2 or more falls or a fall with  injury in the past year? Yes   Prior Activity Level Community (5-7x/wk): Went out daily, was independent and driving.   Prior Functional Level Self Care: Did the patient need help bathing, dressing, using the toilet or eating? Independent   Indoor Mobility: Did the patient need assistance with walking from room to room (with or without device)? Independent   Stairs: Did the patient need assistance with internal or external stairs (with or without device)? Independent   Functional Cognition: Did the patient need help planning regular tasks such as shopping or remembering   to take medications? Independent   Home Assistive Devices / Equipment Home Assistive Devices/Equipment: None, Cane (specify quad or straight) Home Equipment: None   Prior Device Use: Indicate devices/aids used by the patient prior to current illness, exacerbation or injury? None of the above   Current Functional Level Cognition   Overall Cognitive Status: Impaired/Different from baseline Orientation Level: Oriented X4 Safety/Judgement: Decreased awareness of safety, Decreased awareness of deficits General Comments: Pt discussing his deficits as they were prior to surgery and does not seem to be fully understanding his current level of function.     Extremity Assessment (includes Sensation/Coordination)   Upper Extremity Assessment: Defer to OT evaluation (Defer to OT for details but demonstrating significant weaknesses in L UE (particularly shoulder and elbow); also R UE weakness but not as severe) RUE Deficits / Details: Activation of scapular musculature. Weak shoulder protraction/retraction. Patient also able to make weak fist. Patient is unable to actively flex shoulder, elbow, or wrist.  RUE Sensation: decreased light touch RUE Coordination: decreased fine motor, decreased gross motor LUE Deficits / Details: Activation of scapular musculature. Weak shoulder protraction/retraction. Patient also able to make weak  fist. Patient is unable to actively flex shoulder, elbow, or wrist.  LUE Sensation: decreased light touch LUE Coordination: decreased fine motor, decreased gross motor  Lower Extremity Assessment: LLE deficits/detail, RLE deficits/detail RLE Deficits / Details: ROM WFL; MMT 5/5 RLE Sensation: WNL LLE Deficits / Details: ROM WFL; MMT 5/5 LLE Sensation: WNL     ADLs   Overall ADL's : Needs assistance/impaired Eating/Feeding: Maximal assistance Eating/Feeding Details (indicate cue type and reason): reports the foam over utensil handles has been very helpful for grasp however, rom to bring ue to mouth remains a great challenge Grooming: Maximal assistance Grooming Details (indicate cue type and reason): Patient required Max A to wash hands standing at sink level with need for hand over hand assist to manage faucet and lather soap between hands. Patient also required assist to grab paper towels to dry hands  Lower Body Dressing: Maximal assistance, Sitting/lateral leans, Sit to/from stand Lower Body Dressing Details (indicate cue type and reason): Max A to doff/don socks. reports he uses pliers at home to aide in pulling due to grasp issues Toilet Transfer: Min guard, Regular Toilet Toilet Transfer Details (indicate cue type and reason): Patient met seated on commode. Min guard for sit to stand from low commode. Patient unable to functionally use BUE to assist. Dependent on rocking technique to boost up from sitting.   Toileting- Clothing Manipulation and Hygiene: Maximal assistance Toileting - Clothing Manipulation Details (indicate cue type and reason): Patient would likely require Max A for hygiene/clothing management.  Functional mobility during ADLs: Min guard General ADL Comments: pt. is typically R hand dominant but now relies on use of L hand.  states he can not bring lue to mouth or head.  had to hold the phone for him when someone called him.  at home states he utilizes speaker function  because he can not hold the phone.     Mobility   Overal bed mobility: Needs Assistance Bed Mobility: Rolling, Sidelying to Sit, Sit to Sidelying Rolling: Min assist Sidelying to sit: Min assist Sit to supine: Min assist Sit to sidelying: Min assist General bed mobility comments: sitting in chair and beginning and end of session     Transfers   Overall transfer level: Needs assistance Equipment used: 1 person hand held assist Transfers: Sit to/from Stand, Stand Pivot Transfers Sit to   Stand: Min guard, Min assist General transfer comment: cues to maintain precautions. pt. verbalizes understanding but had difficulty transitioniong from sit/stand. reviewed with him PT reported concerns with speed and gait, fall risk. he states it wasnt something with him it was that the socks were too big and moving around and he was over cautious about tripping. also states he has trouble with "what do you call that..oh yeah multi taksing". cued not to talk during ambulation or during tasks so he can focus     Ambulation / Gait / Stairs / Wheelchair Mobility   Ambulation/Gait Ambulation/Gait assistance: Min assist Gait Distance (Feet): 200 Feet Assistive device: 1 person hand held assist, None Gait Pattern/deviations: Step-through pattern, Narrow base of support, Scissoring, Decreased step length - right General Gait Details: Pt with occasional scissoring and frequent narrow BOS. Pt declined RW use as he states it is hard to grip and maneuver. Forward momentum noted throughout gait training and quick gait, concerning for anterior LOB. Occasional assist required throughout for unsteadiness and lateral LOB.  Gait velocity: decreased Gait velocity interpretation: 1.31 - 2.62 ft/sec, indicative of limited community ambulator Stairs: Yes Stairs assistance: Min assist Stair Management: One rail Right, Step to pattern, Forwards Number of Stairs: 6 General stair comments: Pt held to R railing with both hands -  LUE supinated and holding to rail from underneath. Pt having difficulty gripping the rail for descent as he had to hold railing in a pronated position. negotiation appeared more unsteady in descent due to this.      Posture / Balance Balance Overall balance assessment: Needs assistance Sitting-balance support: No upper extremity supported Sitting balance-Leahy Scale: Fair Standing balance support: Single extremity supported, Bilateral upper extremity supported, No upper extremity supported Standing balance-Leahy Scale: Poor Standing balance comment: Required at least single UE support or leaning on counter     Special needs/care consideration Wound Care: Neck dressing in place and C-collar in place and Designated visitor Daughter today    Previous Home Environment (from acute therapy documentation) Living Arrangements: Alone Available Help at Discharge: Family, Friend(s), Available PRN/intermittently Type of Home: House Home Layout: One level Home Access: Stairs to enter Entrance Stairs-Rails: Right Entrance Stairs-Number of Steps: 5 Bathroom Shower/Tub: Tub/shower unit Bathroom Toilet: Standard Home Care Services: No   Discharge Living Setting Plans for Discharge Living Setting: Patient's home, Alone, House (Dtr plans to stay with patient after DC up to 21 days) Type of Home at Discharge: House Discharge Home Layout: One level, Able to live on main level with bedroom/bathroom Discharge Home Access: Stairs to enter Entrance Stairs-Rails: Right Entrance Stairs-Number of Steps: 5-6 Discharge Bathroom Shower/Tub: Tub/shower unit, Curtain Discharge Bathroom Toilet: Handicapped height Discharge Bathroom Accessibility: Yes How Accessible: Accessible via walker Does the patient have any problems obtaining your medications?: No   Social/Family/Support Systems Patient Roles: Parent (Has a daughter and friends.) Contact Information: [name] Harmening - daughter - [phone] Anticipated  Caregiver: Daughter Ability/Limitations of Caregiver: Dtr works but can take time off as needed up to a total of 21 days Caregiver Availability: 24/7 Discharge Plan Discussed with Primary Caregiver: Yes (Met with dtr and patient) Is Caregiver In Agreement with Plan?: Yes Does Caregiver/Family have Issues with Lodging/Transportation while Pt is in Rehab?: No   Goals Patient/Family Goal for Rehab: PT/OT mod I and S goals Expected length of stay: 5-7 days Cultural Considerations: None Pt/Family Agrees to Admission and willing to participate: Yes Program Orientation Provided & Reviewed with Pt/Caregiver Including Roles  & Responsibilities:   Yes   Decrease burden of Care through IP rehab admission: N/A   Possible need for SNF placement upon discharge: Not anticipated   Patient Condition: I have reviewed medical records from [place], spoken with assigned RN on unit and CM, and patient and daughter. I met with patient at the bedside for inpatient rehabilitation assessment.  Patient will benefit from ongoing PT and OT, can actively participate in 3 hours of therapy a day 5 days of the week, and can make measurable gains during the admission.  Patient will also benefit from the coordinated team approach during an Inpatient Acute Rehabilitation admission.  The patient will receive intensive therapy as well as Rehabilitation physician, nursing, social worker, and care management interventions.  Due to bladder management, bowel management, safety, skin/wound care, disease management, medication administration, pain management and patient education the patient requires 24 hour a day rehabilitation nursing.  The patient is currently Min assist with mobility and max assist with basic ADLs.  Discharge setting and therapy post discharge at home with home health is anticipated.  Patient has agreed to participate in the Acute Inpatient Rehabilitation Program and will admit today.   Preadmission Screen Completed  By:  Vyla Pint M Marshaun Lortie, 04/07/2020 12:19 PM ______________________________________________________________________   Discussed status with Dr. Patel and Dr. Raulkar on 04/07/20 at 10:00 and received approval for admission today.   Admission Coordinator:  Ermon Sagan M Kylena Mole, RN, time 12:32/Date 04/07/20    Assessment/Plan: Diagnosis: Cervical myelopathy with quadraparesis   1. Does the need for close, 24 hr/day Medical supervision in concert with the patient's rehab needs make it unreasonable for this patient to be served in a less intensive setting? Yes 2. Co-Morbidities requiring supervision/potential complications: HTN (monitor and provide prns in accordance with increased physical exertion and pain), asthma, Chiari I malformation, syringomyelia, post-op pain (Biofeedback training with therapies to help reduce reliance on opiate pain medications, monitor pain control during therapies, and sedation at rest and titrate to maximum efficacy to ensure participation and gains in therapies) 3. Due to bladder management, bowel management, safety, skin/wound care, disease management, pain management and patient education, does the patient require 24 hr/day rehab nursing? Yes 4. Does the patient require coordinated care of a physician, rehab nurse, PT, OT to address physical and functional deficits in the context of the above medical diagnosis(es)? Yes Addressing deficits in the following areas: balance, endurance, locomotion, strength, transferring, bathing, dressing, grooming, toileting and psychosocial support 5. Can the patient actively participate in an intensive therapy program of at least 3 hrs of therapy 5 days a week? Yes 6. The potential for patient to make measurable gains while on inpatient rehab is excellent 7. Anticipated functional outcomes upon discharge from inpatient rehab: modified independent and supervision PT, modified independent and supervision OT, n/a SLP 8. Estimated rehab length of  stay to reach the above functional goals is: 12-16 days. 9. Anticipated discharge destination: Home 10. Overall Rehab/Functional Prognosis: excellent     MD Signature: Ankit Patel, MD, ABPMR        Revision History                     Note Details  Author Patel, Ankit Anil, MD File Time 04/07/2020 12:51 PM  Author Type Physician Status Signed  Last Editor Patel, Ankit Anil, MD Service Physical Medicine and Rehabilitation  Hospital Acct # [id number] Admit Date 04/07/2020    

## 2020-04-11 NOTE — Progress Notes (Addendum)
Patient ID: Shannon [name], male   DOB: Jan 30, 1946, 74 y.o.   MRN: [id number]  Met with pt to discuss team conference goals supervision-mobility and mod assist for ADLs'-bathing dressing tolieting, eating. [employer] discharge date is 10/27. Discussed with pt the amount of care he will require and if daughter can do this and if he is comfortable with her doing this. He feels fine with it and aware will need daughter in for education next week-Monday and Tuesday to see what he requires. Have left a message for daughter awaiting return call. She is not coming in today to allow his friend to come instead. Await daughter's return call.  2:30 PM Spoke with daughter who called back to discuss team conference goals and the amount of care pt will need and if she is comfortable with this. She voiced: " I am taking him home and will do what is needed." Unsure if daughter realizes the care pt requires and for how long he will need this. She states: " How long does he need someone with him?" Discussed however long he requires care. Informed until he can do for himself or will need to hire assist to do what he needs once she goes back to work and [employer]. Will see on Monday and make sure daughter does hands on care with therapy team.

## 2020-04-11 NOTE — Consult Note (Signed)
Neuropsychological Consultation   Patient:   Shannon [name]   DOB:   1946/03/06  MR Number:  [id number]  Location:  [facility] [address] CENTER B [address] [id number] [place] [address] Dept: [phone] Loc: [phone]           Date of Service:   04/11/2020  Start Time:   2 PM End Time:   3 PM  Provider/Observer:  Arley Phenix, Psy.D.       Clinical Neuropsychologist       Billing Code/Service: 74081  Chief Complaint:    Shannon [name] is a 74 year old male with history of hypertension, asthma as well as Chiari malformation, syringomyelia.  Patient presented on 04/05/2020 with complaints of new left arm pain, numbness as well as chronic arm pain on right with numbness, tingling, and weakness as well as unsteady gait difficulty using his hands with decreased coordination.  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  Patient underwent C3-4 C4-5 anterior cervical discectomy decompression surgery.  Hospital course was complicated by postoperative pain.  Therapy evaluations were done to assess appropriateness and the patient was admitted to the comprehensive rehabilitation program.  The patient had a lot of grievances and issues with the process of being transferred from outpatient to an inpatient facility and has continued to struggle with intrusive thinking and frustration/grievance overlying his recovery from recent cervical surgery.  Reason for Service:  Patient was referred for neuropsychological consultation due to adjustment issues and frustration with the patient prior to and during his rehabilitative efforts.  Below is the HPI for the current admission.  HPI: Shannon [name] is a 74 year old right-handed male with history of hypertension, asthma as well as Chiari I malformation,syringomyelia.  History taken from chart review, patient, and  daughter. Patient lives alone.  1 level home 5 steps to entry.  Reports ambulating without assistive device.  He presented on 04/05/20 with complaints of new left arm pain, numbness as well as chronic arm pain on the right with numbness tingling weakness as well as unsteady gait difficulty using his hands with decreased coordination.  Admission chemistries unremarkable except glucose of 120, hemoglobin 14.9.  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  Underwent C3-4 C4-5 anterior cervical discectomy decompression interbody arthrodesis on 04/05/20 per Dr. Tressie Stalker.  Hospital course further complicated by post operative pain. Cervical brace as directed.  Tolerating a regular diet.  Therapy evaluations completed and patient was admitted for a comprehensive rehab program. Prolonged discussion with patient and daughter regarding grievances and process for inpatient rehab. Please see preadmission assessment from earlier today as well.   Current Status:  The patient was sitting in his chair with safety straps in place as well as video monitoring in place.  Patient admitted to falling out of his bed on first night on the unit after his legs became tangled in his gown and he was frustrated and agitated.  Patient continues to be hyper focused on past perceived grievances and has an underlying personality style likely being exacerbated by his current medical status.  The patient reports that he is understanding the need for safety and compliance to avoid any secondary problems while he is working on recovering from his recent cervical surgery but also frustrated by limitations and constraints.  Patient perceives that he has not always been given all of the information available and is hypervigilant.  This is  likely a longstanding style that given the circumstances are likely being highlighted by his current stay.  The patient reports that he  understands the need for therapies and is seeing improvement in his overall status.  He tends to think in a very contrary way to current lines of discussion and it is difficult to redirect him back into focus discussions around his recovery.  Patient denies any psychosis related to hallucinations or delusions and his hypervigilance does not rise to the status of being paranoid per se.  The patient is just in a situation that he has little control over and his coping strategies in general are not particularly effective for his current situation and status.  Behavioral Observation: Shannon [name]  presents as a 74 y.o.-year-old Right African American Male who appeared his stated age. his dress was Appropriate and he was Well Groomed and his manners were Appropriate, inappropriate to the situation.  his participation was indicative of Intrusive and Monopolizing behaviors.  There were any physical disabilities noted.  he displayed an inappropriate level of cooperation and motivation.     Interactions:    Active Monopolizing  Attention:   abnormal and patient was distracted by internal preoccupations for much of the visit.  Memory:   within normal limits; recent and remote memory intact  Visuo-spatial:  not examined  Speech (Volume):  normal  Speech:   normal; normal  Thought Process:  Coherent and Tangential  Though Content:  Rumination; not suicidal and not homicidal  Orientation:   person, place, time/date and situation  Judgment:   Fair  Planning:   Fair  Affect:    Angry, Excited and Irritable  Mood:    Dysphoric and Irritable  Insight:   Fair  Intelligence:   normal  Medical History:   Past Medical History:  Diagnosis Date  . Asthma   . 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 Newell Coral, 1992-->surg at [facility].  Summer 2021->Cervicalgia,arm pain, hand atrophy RUE wkness, hyperreflex (Dr.  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 Hay fever   . Hypertension   . Osteoarthritis of both hands    Psychiatric History:  No prior psychiatric history  Family Med/Psych History:  Family History  Problem Relation Age of Onset  . Heart attack Mother   . Heart disease Mother   . High blood pressure Mother   . Alcohol abuse Father   . Cancer Father   . Diabetes Father   . High blood pressure Father     Risk of Suicide/Violence: low patient denies any suicidal or homicidal ideation and made no impression or indications that he held such views or motivations  Impression/DX:  Shannon [name] is a 74 year old male with history of hypertension, asthma as well as Chiari malformation, syringomyelia.  Patient presented on 04/05/2020 with complaints of new left arm pain, numbness as well as chronic arm pain on right with numbness, tingling, and weakness as well as unsteady gait difficulty using his hands with decreased coordination.  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  Patient underwent C3-4 C4-5 anterior cervical discectomy decompression surgery.  Hospital course was complicated by postoperative pain.  Therapy evaluations were done to assess appropriateness and the patient was admitted to the comprehensive rehabilitation program.  The patient had a lot of grievances and issues with the process of being transferred  from outpatient to an inpatient facility and has continued to struggle with intrusive thinking and frustration/grievance overlying his recovery from recent cervical surgery.  The patient was sitting in his chair with safety straps in place as well as video monitoring in place.  Patient admitted to falling out of his bed on first night on the unit after his legs became tangled in his gown and he was frustrated and agitated.  Patient continues to be hyper  focused on past perceived grievances and has an underlying personality style likely being exacerbated by his current medical status.  The patient reports that he is understanding the need for safety and compliance to avoid any secondary problems while he is working on recovering from his recent cervical surgery but also frustrated by limitations and constraints.  Patient perceives that he has not always been given all of the information available and is hypervigilant.  This is likely a longstanding style that given the circumstances are likely being highlighted by his current stay.  The patient reports that he understands the need for therapies and is seeing improvement in his overall status.  He tends to think in a very contrary way to current lines of discussion and it is difficult to redirect him back into focus discussions around his recovery.  Patient denies any psychosis related to hallucinations or delusions and his hypervigilance does not rise to the status of being paranoid per se.  The patient is just in a situation that he has little control over and his coping strategies in general are not particularly effective for his current situation and status.  Disposition/Plan:  I will follow up with the patient later in his stay to continue to assess his frustration and hope to provide further guidance about focusing on his efforts for recovery post surgery with a view of his status post discharge.  Diagnosis:    Cervical myelopathy (HCC) - Plan: Ambulatory referral to Physical Medicine Rehab         Electronically Signed   _______________________ Arley Phenix, Psy.D.

## 2020-04-11 NOTE — Progress Notes (Signed)
[place] PHYSICAL MEDICINE & REHABILITATION PROGRESS NOTE   Subjective/Complaints: Ascending and descending stairs well with therapy! Makes good eye contact Sleeping well at night.  Has some pain in right shoulder but prefers no medications  ROS- denies CP, SOB, N/V/D,     Objective:   No results found. Recent Labs    04/10/20 0709  WBC 6.1  HGB 12.9*  HCT 40.0  PLT 166   Recent Labs    04/10/20 0709  NA 142  K 3.6  CL 103  CO2 28  GLUCOSE 109*  BUN 5*  CREATININE 0.94  CALCIUM 9.2    Intake/Output Summary (Last 24 hours) at 04/11/2020 1058 Last data filed at 04/11/2020 0800 Gross per 24 hour  Intake 440 ml  Output --  Net 440 ml        Physical Exam: Vital Signs Blood pressure (!) 141/74, pulse 91, temperature 98.6 F (37 C), resp. rate 17, height 5\' 10"  (1.778 m), weight 91 kg, SpO2 93 %.  General: Alert and oriented x 3, No apparent distress HEENT: Head is normocephalic, atraumatic, PERRLA, EOMI, sclera anicteric, oral mucosa pink and moist, dentition intact, ext ear canals clear,  Neck: Supple without JVD or lymphadenopathy Heart: Reg rate and rhythm. No murmurs rubs or gallops Chest: CTA bilaterally without wheezes, rales, or rhonchi; no distress Abdomen: Soft, non-tender, non-distended, bowel sounds positive.  Extremities: No clubbing, cyanosis, or edema. Pulses are 2+ Skin: Clean and intact without signs of breakdown Neurologic: motor strength is 3-/5 in bilateral deltoid,4- bicep, 3- tricep,3- grip,4- hip flexor, 4-knee extensors, 3-ankle dorsiflexor and plantar flexor, + ataxia in LEs  Musculoskeletal: no pain with ROM  range of motion in all 4 extremities. No joint swelling     Assessment/Plan: 1. 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  Physiatrist is providing close team supervision and 24 hour management of  active medical problems listed below.  Physiatrist and rehab team continue to assess barriers to discharge/monitor patient progress toward functional and medical goals  Care Tool:  Bathing    Body parts bathed by patient: Chest, Abdomen, Front perineal area, Right upper leg, Left upper leg   Body parts bathed by helper: Right lower leg, Left lower leg, Buttocks, Right arm, Left arm     Bathing assist Assist Level: Moderate Assistance - Patient 50 - 74%     Upper Body Dressing/Undressing Upper body dressing   What is the patient wearing?: Pull over shirt    Upper body assist Assist Level: Maximal Assistance - Patient 25 - 49%    Lower Body Dressing/Undressing Lower body dressing      What is the patient wearing?: Underwear/pull up, Pants     Lower body assist Assist for lower body dressing: Total Assistance - Patient < 25%     Toileting Toileting    Toileting assist Assist for toileting: Moderate Assistance - Patient 50 - 74%     Transfers Chair/bed transfer  Transfers assist     Chair/bed transfer assist level: Minimal Assistance - Patient > 75%     Locomotion Ambulation   Ambulation assist      Assist level: Moderate Assistance - Patient 50 - 74% Assistive device: Other (comment) (gait velt) Max distance: 140   Walk 10 feet activity   Assist     Assist level: Minimal Assistance - Patient > 75% Assistive device: No Device   Walk  50 feet activity   Assist    Assist level: Moderate Assistance - Patient - 50 - 74% Assistive device: No Device    Walk 150 feet activity   Assist    Assist level: Moderate Assistance - Patient - 50 - 74% Assistive device: No Device    Walk 10 feet on uneven surface  activity   Assist     Assist level: Minimal Assistance - Patient > 75% Assistive device: Other (comment) (none)   Wheelchair     Assist Will patient use wheelchair at discharge?: No             Wheelchair 50 feet with 2  turns activity    Assist            Wheelchair 150 feet activity     Assist          Blood pressure (!) 141/74, pulse 91, temperature 98.6 F (37 C), resp. rate 17, height 5\' 10"  (1.778 m), weight 91 kg, SpO2 93 %. Medical Problem List and Plan: 1.  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  2.  Antithrombotics: -DVT/anticoagulation:  SCDs             -antiplatelet therapy: N/A  3. Pain Management: Flexeril and oxycodone as needed- not using, will discontinue.              Monitor for post-op and neuropathic pain with increased exertion. Has some chronic right shoulder pain- prefers no pain medications.  4. Mood: Provide emotional support             -antipsychotic agents: N/A 5. Neuropsych: This patient is capable of making decisions on his own behalf. 6. Skin/Wound Care: Routine skin checks 7. Fluids/Electrolytes/Nutrition: Routine in and outs. CMP ordered. 8.  Hypertension.  Norvasc 10 mg daily.                Vitals:   04/10/20 1948 04/11/20 0441  BP: 121/73 (!) 141/74  Pulse: 93 91  Resp: 17 17  Temp: 98.5 F (36.9 C) 98.6 F (37 C)  SpO2: 95% 93%  controlled 10/19 9. Asthma             Monitor respiratory states with activity 10.  Safety- pt without confusion, is not aware of current limitations, should improve with PT, OT  Will use telesitter until pt using call bell consistently, now has "pancake" 11. Constipation: started daily Miralax- has not yet helped. Advised to let 11/19 know if she wants more stool softeners.  12. Acute blood loss anemia: Hgb 12.9 on 10/18 13. Disposition: lives alone. Daughter  will be staying with him a little when he leaves here.      LOS: 4 days A FACE TO FACE EVALUATION WAS PERFORMED  11/18 [name] 04/11/2020, 10:58 AM

## 2020-04-11 NOTE — Progress Notes (Signed)
Physical Therapy Session Note  Patient Details  Name: Shannon [name] MRN: [id number] Date of Birth: Feb 15, 1946  Today's Date: 04/11/2020 PT Individual Time: 1030-1130 and 800-900 PT Individual Time Calculation (min): 60 min and 60 min  Short Term Goals: Week 1:  PT Short Term Goal 1 (Week 1): Pt will perform supine<>sit with CGA PT Short Term Goal 2 (Week 1): Pt will perform sit<>stands with CGA PT Short Term Goal 3 (Week 1): Pt will perforrm bed<>chair transfers with CGA PT Short Term Goal 4 (Week 1): Pt will ambulate at least 130ft using LRAD with CGA PT Short Term Goal 5 (Week 1): Pt will ascend/descend 8 steps using R HR with CGA Week 2:    Week 3:     Skilled Therapeutic Interventions/Progress Updates:    PAIN R shoulder 4/5, rest and repositioning as needed.    Pt initially oob in chair and requesting to use BR.  UE removed from mobile arm support by therapist, pt directs therapist in process. STS from armchair w/cga.  Gait 15ft around bed to bathroom including negotiating threshold w/min assist, cues for posture.   Turn/sit to commode w/min assist.  Pt requires mod to max assist for donning clean underwear in shorts while seated on commode, dependent for teds and socks.  Pt not able to void or pass BM STS and gait to wc w/min assist.   Pt transported to gym for continued session. Standing therex:   Sidestepping w/single UE support on bars 82ft x 6 w/cga , cues for upright posture and clearance Marching in place w/limited ROM x 10 w/min assist, single ue support, wobbles on stance knee Trunk flexion/extension w/bilat UE support x 10 reps w/cues to return to full upright  Stairs: Ascended/descended 4 steps w/single rail on R w/min assist.  Gait 127ft w/min assist, cues for posture, cues for step length/increasing clearance/increasing BOS. SPT to armchair w/min assist. Pt left oob in chair w/alarm belt set and needs in reach    SECOND SESSION PAIN 4/10 shoulder, support  provided w/mobility, rest as needed  Pt initially oob in chair as left prior session. SPT to wc w/min assist, cues for safety. Transported to gym for energy efficiency.  STEP UPS ON 6INSTEP USING TWO RAILS w/assist for hand placement, cga and cues for upright posture x 20reps  Seated hip abd w/green TB resistance 2 x15 Seated ankle DF x 15 Supine bridge x 15 Bridge w/ball squeeze adduction x 15 Clamshells w/focus on coordinated movement x 20 Resisted clamshells w/green TB resistance x 15 Rolling theraball under feet for HS activation x 20 w/focus on maintaining midline/controlling    Gait/Retrogait Gait x 23ft w/min assist and cues for base of support/clearance/upright posture followed by Retrogait using single handrail and min assist, cues for upright posture x 86feet  Repeated x 2, significant trunk flexion tendency w/retrogait w/fatigue.  Transported to room at end of session.  SPT to armchair w/min assist/mild post tendency due to decreased control of transition from sit to stand.  Pt left oob in armchair w/alarm belt set and needs in reach   Pt w/significant variability w/gait/dynamic balance w/fatigue.  Moves w/cga at time, but also has experienced balance loss requiring max assist w/fatigue.    Therapy Documentation Precautions:  Precautions Precautions: Cervical Precaution Booklet Issued: Yes (comment) Required Braces or Orthoses: Cervical Brace Cervical Brace: Hard collar, Other (comment) (don in sitting) Restrictions Weight Bearing Restrictions: No    Therapy/Group: Individual Therapy  Rada Hay, PT   Britta Mccreedy  Ardeth Perfect 04/11/2020, 12:40 PM

## 2020-04-11 NOTE — Patient Care Conference (Signed)
Inpatient RehabilitationTeam Conference and Plan of Care Update Date: 04/11/2020   Time: 11:45 AM    Patient Name: Shannon [name]      Medical Record Number: [id number]  Date of Birth: Dec 24, 1945 Sex: Male         Room/Bed: 4M01C/4M01C-01 Payor Info: Payor: MEDICARE / Plan: MEDICARE PART A AND B / Product Type: *No Product type* /    Admit Date/Time:  04/07/2020  4:57 PM  Primary Diagnosis:  Cervical myelopathy [facility])  Hospital Problems: Principal Problem:   Cervical myelopathy [facility])    Expected Discharge Date: Expected Discharge Date: 04/19/20  Team Members Present: Physician leading conference: Dr. Sula Soda Care Coodinator Present: Kennyth Arnold, RN, BSN, CRRN;Becky Dupree, LCSW Nurse Present: Other (comment) Delene Loll, RN) PT Present: Casimiro Needle, PT OT Present: Earleen Newport, OT PPS Coordinator present : Fae Pippin, [place], PT     Current Status/Progress Goal Weekly Team Focus  Bowel/Bladder             Swallow/Nutrition/ Hydration             ADL's   total A for eating/grooming, Mod-Max bathing, Max toileting, very limited use of BUEs but Min for transfers  Mod bathe/dress/toilet/eating  AE for self feeding, BUE NMR, ADL retraining   Mobility   min to mod gait no AD/cannot use due to UE weakness.  min to cga transfers but lower surfaces mod assist/decreased safety, min stairs w/single rail x 4.  Focus on balance, safety w/transfers and gait, gait pattern, improving clearance  S level ambulation, stairs and transfers  upright posture, core stability, gait/postural endurance   Communication             Safety/Cognition/ Behavioral Observations            Pain             Skin               Discharge Planning:  HOme with daughter assisting for a short time-on leave from work. Pt is very motivated to do well and make progress here   Team Discussion: Continent B/B, makes needs known. OT total assist for feeding, max assist for  bathing and dressing. Mod assist goals BUE has very limited function, min assist for transfers. PT can do 4 steps to enter home with supervision goals. Daughter plans to take some time from work to be with patient at time of discharge. Patient on target to meet rehab goals: yes  *See Care Plan and progress notes for long and short-term goals.   Revisions to Treatment Plan:  None at this time.  Teaching Needs: Continue family education with daughter.  Current Barriers to Discharge: Neurogenic bowel and bladder and pain and BP control.  Possible Resolutions to Barriers: Continue bowel program, Continue current medication regimen for pain and BP management.     Medical Summary Current Status: pain is well controlled, constipation, BP has been well controlled  Barriers to Discharge: Medical stability  Barriers to Discharge Comments: chronic right shoulder pain, constipation, HTN Possible Resolutions to [employer] Focus: discontinue pain medications as he does not like them, continue bowel program, monitor BP TID and continue amlodipine   Continued Need for Acute Rehabilitation Level of Care: The patient requires daily medical management by a physician with specialized training in physical medicine and rehabilitation for the following reasons: Direction of a multidisciplinary physical rehabilitation program to maximize functional independence : Yes Medical management of patient stability for increased  activity during participation in an intensive rehabilitation regime.: Yes Analysis of laboratory values and/or radiology reports with any subsequent need for medication adjustment and/or medical intervention. : Yes   I attest that I was present, lead the team conference, and concur with the assessment and plan of the team.   Tennis Must 04/11/2020, 5:38 PM

## 2020-04-11 NOTE — Progress Notes (Signed)
Occupational Therapy Session Note  Patient Details  Name: Shannon [name] MRN: [id number] Date of Birth: [birth year]  Today's Date: 04/11/2020 OT Individual Time: 1258-1411 OT Individual Time Calculation (min): 73 min    Short Term Goals: Week 1:  OT Short Term Goal 1 (Week 1): STGs = LTGs   Skilled Therapeutic Interventions/Progress Updates:    Pt greeted at time of session sitting up in chair with lunch in front of him, appearing that he barely ate and when prompted to eat more lunch, pt stating he was not hungry multiple times and declining his lunch. MAS present in room, briefly used with pt to increase AROM and functional use of LUE. Pt very hyperverbal and discussed at length his frustrations with his condition, situation regarding going home, etc. And required frequent and firm verbal cues to redirect to tasks, with little success. Emotional support provided as needed. Sit to stand from chair with Min A with mild retropulsion noted initially, ambulated to/from bathroom with Min A, no AD, became frustrated when asked to take larger less shuffling steps. SPT to commode with 3-in-1 over toilet with Min A, Max for clothing management and total A for hygiene with pt able to have some AROM in BUEs with L>R for gripping top of pants/underwear. LB bathing while seated on commode with hand over hand assist to guide LUE to wash thighs. Pt ambualted back to chair Min A, set up with soft call bell in reach and alarm on, all needs met. Discussed at length his need for care at home for all ADLs including bathing, dressing, toileting, and mobility d/t decreased safety and decreased functional use of his BUEs.   Therapy Documentation Precautions:  Precautions Precautions: Cervical Precaution Booklet Issued: Yes (comment) Required Braces or Orthoses: Cervical Brace Cervical Brace: Hard collar, Other (comment) (don in sitting) Restrictions Weight Bearing Restrictions: No     Therapy/Group:  Individual Therapy  Viona Gilmore 04/11/2020, 3:56 PM

## 2020-04-12 ENCOUNTER — Inpatient Hospital Stay (HOSPITAL_COMMUNITY): Payer: Medicare Other

## 2020-04-12 ENCOUNTER — Inpatient Hospital Stay (HOSPITAL_COMMUNITY): Payer: Medicare Other | Admitting: Occupational Therapy

## 2020-04-12 ENCOUNTER — Ambulatory Visit: Payer: Medicare Other | Admitting: Family Medicine

## 2020-04-12 DIAGNOSIS — G959 Disease of spinal cord, unspecified: Secondary | ICD-10-CM | POA: Diagnosis not present

## 2020-04-12 MED ORDER — MAGNESIUM CITRATE PO SOLN
1.0000 | Freq: Once | ORAL | Status: AC
  Administered 2020-04-12: 1 via ORAL
  Filled 2020-04-12: qty 296

## 2020-04-12 NOTE — Progress Notes (Signed)
Physical Therapy Session Note  Patient Details  Name: RAMONE GANDER MRN: [id number] Date of Birth: [birth year]  Today's Date: 04/12/2020 PT Individual Time: 0830-0915 PT Individual Time Calculation (min): 45 min   Short Term Goals: Week 1:  PT Short Term Goal 1 (Week 1): Pt will perform supine<>sit with CGA PT Short Term Goal 2 (Week 1): Pt will perform sit<>stands with CGA PT Short Term Goal 3 (Week 1): Pt will perforrm bed<>chair transfers with CGA PT Short Term Goal 4 (Week 1): Pt will ambulate at least 162ft using LRAD with CGA PT Short Term Goal 5 (Week 1): Pt will ascend/descend 8 steps using R HR with CGA  Skilled Therapeutic Interventions/Progress Updates:    Patient in chair at bedside wearing device to help with self feeding.  Reports called 25 minutes ago to go to bathroom.  Assisted out of chair min A with use of LE's bracing on edge of chair and ambulated to bathroom with min A flexed posture, toileted with mod A for clothing and assisted to chair to don TED's and shoes with total A.  Noted dressing on neck coming off so removed and RN aware.  Patient ambulated x 200' with min A wearing shoes, slow pace, min cues for head up and shoulders back, increased time on turns.  Patient performed sit<>stand x 5 mod cues and min A for increased anterior weight shift to limit bracing of legs on chair, two times legs bracing on chair with chair hitting the wall behind.  Education on safety and need for mechanics for safety.  Discussed use of RW vs PFRW and pt reports feels it is too restrictive for him as his feet may hit the walker and he cannot adjust for that.  Agreed PFRW more restrictive, but pt willing to try it to see.  Patient transferred to bed min A and sit to supine with min A effortful and across the bed so mod cues and A for repositioning.  Educated on rolling for bed mobility and side to sit with min A.  Patient transferred to chair and belt alarm reapplied left with needs in reach.    Therapy Documentation Precautions:  Precautions Precautions: Cervical Precaution Booklet Issued: Yes (comment) Required Braces or Orthoses: Cervical Brace Cervical Brace: Hard collar, Other (comment) (don in sitting) Restrictions Weight Bearing Restrictions: No Pain: Pain Assessment Pain Score: 4  Pain Type: Chronic pain Pain Location: Shoulder Pain Orientation: Right Pain Descriptors / Indicators: Guarding Pain Onset: On-going Pain Intervention(s): Repositioned    Therapy/Group: Individual Therapy  Elray Mcgregor  Sheran Lawless, PT 04/12/2020, 8:38 AM

## 2020-04-12 NOTE — Progress Notes (Signed)
Physical Therapy Session Note  Patient Details  Name: Shannon [name] MRN: [id number] Date of Birth: 1945-11-03  Today's Date: 04/12/2020 PT Individual Time: 1100-1200 and 1445-1530 PT Individual Time Calculation (min): 60 min and 45 min  Short Term Goals: Week 1:  PT Short Term Goal 1 (Week 1): Pt will perform supine<>sit with CGA PT Short Term Goal 2 (Week 1): Pt will perform sit<>stands with CGA PT Short Term Goal 3 (Week 1): Pt will perforrm bed<>chair transfers with CGA PT Short Term Goal 4 (Week 1): Pt will ambulate at least 121ft using LRAD with CGA PT Short Term Goal 5 (Week 1): Pt will ascend/descend 8 steps using R HR with CGA Week 2:     Skilled Therapeutic Interventions/Progress Updates:    AM SESSION PAIN  Rates overall as 4/10, gives lengthy explanation of premorbid conditions and pain levels associated w/this.  Also describes areas without pain at this time.  Treatment to tolerance, support of UEs provided to decrease shoulder strain/discomfort.  Pt seen for treatment session w/emphasis on gait endurance and gait pattern. SPT armchair to wc w/min assist due to post tendency w/transition from STS. Pt transported to dayroom and educated re use of Litegait/procedure. STS from wc w/cga to Litegait. Pt stands w/UE support and supervision for several min while  Therapist donned harness and adjusted Litegait.  Gait in Litegait harness x 316ft w/bilat hands on grips of litegait, minimal bodywt support. W/cues for increased step length on r, equalizing step length.  Pt w/wobbles bilat knees w/loading but no buckling. Much improved cadence, posture and RLE clearance and bilat step length in Litegait w/vey minimal support.  Pt rested in sitting x 5-6 min, discussed posture, gait deviations. Repeated gait as above x 249ft , w/fatigue pt demonstrates decreased RLE clearance, increased ant lean, increased wobbles, decreased step length.  Pt stands w/supervision for removal of  harness. transferred to wc w/cues, cga.  Pt transported back to room.  SPT wc to armchair w/cga. UEs supported using pillows. Pt left oob in armchair w/alarm belt set and needs in reach   PM SESSION PAIN 3-4/10 R shoulder, rest as needed, treatment to tolerance, support provided w/mobility  Pt initially oob in armchair.  SPT to wc w/min assist. Transported to gym for general strengthening and balance.  Step ups on 6in step using bilat rails leading L x 20, R x 10  Stairs:  Ascends/descends 4 steps w/2 rails w/min assist, cues for safe sequencing.  wc propulsion w/bilat LEs x 27ft for HS strengthening.  STS w/min assist to cga from wc. Gait/Retrogait x 30ft each direction w/min assist forwards, cues for posture, decreased step length RLE, single lob to R w/mod assist to stabilize. Uses rail on R w/retrogait, cues for posture, cga  Repeated x 2. Transported to room at end of session SPT wc to armchair w/min assist. Repositioned w/UE support. Pt left oob in chair w/alarm belt set and needs in reach.     Therapy Documentation Precautions:  Precautions Precautions: Cervical Precaution Booklet Issued: Yes (comment) Required Braces or Orthoses: Cervical Brace Cervical Brace: Hard collar, Other (comment) (don in sitting) Restrictions Weight Bearing Restrictions: No    Therapy/Group: Individual Therapy  Rada Hay, PT   [name] 04/12/2020, 12:24 PM

## 2020-04-12 NOTE — Progress Notes (Signed)
Occupational Therapy Session Note  Patient Details  Name: Shannon [name] MRN: [id number] Date of Birth: 05-08-1946  Today's Date: 04/12/2020 OT Individual Time: 1304-1402 OT Individual Time Calculation (min): 58 min    Short Term Goals: Week 1:  OT Short Term Goal 1 (Week 1): STGs = LTGs  Skilled Therapeutic Interventions/Progress Updates:    Treatment session with focus on functional use of BUE during bathing and dressing.  Pt received upright in chair, reporting some difficulty with eating but that he "did what he could".  Mobile arm support set up behind pt, but pt did not provide direct answer when asked if he used it to assist with feeding.  Pt completed sit > stand and stand pivot transfers without session with CGA.  Engaged in bathing and dressing at sink with focus on increased functional use of BUE and setup and AE to increase active participation.  Therapist assisted with pulling shirt over head, pt then able to pull off arms and remove shirt.  Engaged in UB bathing with pt able to wash stomach and distal RUE, therapist provided support at elbow to facilitate further reach to allow for pt to wash Rt underarm.  Pt with decreased use of RUE despite therapist assist at elbow as with LUE, therefore requiring increased assistance when washing LUE.  Pt able to doff shorts and wash perineal area with increased time but required assistance to wash buttocks.  Pt unable to don underwear despite multiple attempts.  Therapist educated pt on use of reacher for LB dressing, with pt requiring setup assist and assistance to advance clothing over heel.  Pt reports not liking reacher, desire to continue attempts without AE during future sessions.  Pt able to pull pants over thighs but required assistance over hips and buttocks.  Engaged in UB dressing with pt able to participate in threading sleeves over arms, but ultimately requiring max-total assist to don shirt.  Pt able to complete oral care with min  assist with BUE propped on counter, requiring setup to apply toothpaste and then to bring cup to mouth to allow pt to rinse.  Pt ambulated back to upright chair and left upright with seat belt alarm on, call bell in reach, and pillows under BUE for support and increased positioning.  Therapy Documentation Precautions:  Precautions Precautions: Cervical Precaution Booklet Issued: Yes (comment) Required Braces or Orthoses: Cervical Brace Cervical Brace: Hard collar, Other (comment) (don in sitting) Restrictions Weight Bearing Restrictions: No General:   Vital Signs: Therapy Vitals Temp: 98 F (36.7 C) Pulse Rate: 99 Resp: 16 BP: 131/83 Patient Position (if appropriate): Sitting Oxygen Therapy SpO2: 100 % O2 Device: Room Air Pain:  Pt with no c/o pain   Therapy/Group: Individual Therapy  Rosalio Loud 04/12/2020, 2:11 PM

## 2020-04-12 NOTE — Progress Notes (Signed)
[place] PHYSICAL MEDICINE & REHABILITATION PROGRESS NOTE   Subjective/Complaints: Therapy notes reviewed.  He is very agreeable to therapy Not eating much Hard cervical collar in place  ROS- denies CP, SOB, N/V/D,     Objective:   No results found. Recent Labs    04/10/20 0709  WBC 6.1  HGB 12.9*  HCT 40.0  PLT 166   Recent Labs    04/10/20 0709  NA 142  K 3.6  CL 103  CO2 28  GLUCOSE 109*  BUN 5*  CREATININE 0.94  CALCIUM 9.2    Intake/Output Summary (Last 24 hours) at 04/12/2020 0828 Last data filed at 04/11/2020 1833 Gross per 24 hour  Intake 360 ml  Output --  Net 360 ml        Physical Exam: Vital Signs Blood pressure 138/84, pulse 97, temperature 98.3 F (36.8 C), resp. rate 16, height 5\' 10"  (1.778 m), weight 91 kg, SpO2 97 %.  General: Alert and oriented x 3, No apparent distress HEENT: Head is normocephalic, atraumatic, PERRLA, EOMI, sclera anicteric, oral mucosa pink and moist, dentition intact, ext ear canals clear,  Neck: Supple without JVD or lymphadenopathy Heart: Reg rate and rhythm. No murmurs rubs or gallops Chest: CTA bilaterally without wheezes, rales, or rhonchi; no distress Abdomen: Soft, non-tender, non-distended, bowel sounds positive. Extremities: No clubbing, cyanosis, or edema. Pulses are 2+ Skin: Clean and intact without signs of breakdown Neurologic: motor strength is 3-/5 in bilateral deltoid,4- bicep, 3- tricep,3- grip,4- hip flexor, 4-knee extensors, 3-ankle dorsiflexor and plantar flexor, + ataxia in LEs  Musculoskeletal: no pain with ROM  range of motion in all 4 extremities. No joint swelling      Assessment/Plan: 1. 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  Physiatrist is providing close team supervision and 24 hour management of active medical problems listed below.  Physiatrist and rehab  team continue to assess barriers to discharge/monitor patient progress toward functional and medical goals  Care Tool:  Bathing    Body parts bathed by patient: Chest, Abdomen, Front perineal area, Right upper leg, Left upper leg   Body parts bathed by helper: Right lower leg, Left lower leg, Buttocks, Right arm, Left arm     Bathing assist Assist Level: Moderate Assistance - Patient 50 - 74%     Upper Body Dressing/Undressing Upper body dressing   What is the patient wearing?: Pull over shirt    Upper body assist Assist Level: Maximal Assistance - Patient 25 - 49%    Lower Body Dressing/Undressing Lower body dressing      What is the patient wearing?: Underwear/pull up, Pants     Lower body assist Assist for lower body dressing: Total Assistance - Patient < 25%     Toileting Toileting    Toileting assist Assist for toileting: Maximal Assistance - Patient 25 - 49%     Transfers Chair/bed transfer  Transfers assist     Chair/bed transfer assist level: Minimal Assistance - Patient > 75%     Locomotion Ambulation   Ambulation assist      Assist level: Moderate Assistance - Patient 50 - 74% Assistive device: Other (comment) (gait velt) Max distance: 140   Walk 10 feet activity   Assist     Assist level: Minimal Assistance - Patient > 75% Assistive device: No Device   Walk 50 feet activity   Assist  Assist level: Moderate Assistance - Patient - 50 - 74% Assistive device: No Device    Walk 150 feet activity   Assist    Assist level: Moderate Assistance - Patient - 50 - 74% Assistive device: No Device    Walk 10 feet on uneven surface  activity   Assist     Assist level: Minimal Assistance - Patient > 75% Assistive device: Other (comment) (none)   Wheelchair     Assist Will patient use wheelchair at discharge?: No             Wheelchair 50 feet with 2 turns activity    Assist            Wheelchair 150  feet activity     Assist          Blood pressure 138/84, pulse 97, temperature 98.3 F (36.8 C), resp. rate 16, height 5\' 10"  (1.778 m), weight 91 kg, SpO2 97 %. Medical Problem List and Plan: 1.  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  2.  Antithrombotics: -DVT/anticoagulation:  SCDs             -antiplatelet therapy: N/A  3. Pain Management: Flexeril and oxycodone as needed- not using, will discontinue.              Monitor for post-op and neuropathic pain with increased exertion. Has some chronic right shoulder pain- prefers no pain medications.  4. Mood: Provide emotional support             -antipsychotic agents: N/A 5. Neuropsych: This patient is capable of making decisions on his own behalf. 6. Skin/Wound Care: Routine skin checks 7. Fluids/Electrolytes/Nutrition: Routine in and outs. CMP ordered. 8.  Hypertension.  Norvasc 10 mg daily.                Vitals:   04/11/20 1949 04/12/20 0352  BP: 120/74 138/84  Pulse: 89 97  Resp: 16 16  Temp: 98 F (36.7 C) 98.3 F (36.8 C)  SpO2: 94% 97%  controlled 10/20 9. Asthma             Monitor respiratory states with activity 10.  Safety- pt without confusion, is not aware of current limitations, should improve with PT, OT  Will use telesitter until pt using call bell consistently, now has "pancake" 11. Constipation: started daily Miralax- has not yet helped. Advised to let 11/20 know if she wants more stool softeners.   10/20: magnesium citrate at 4pm today.  12. Acute blood loss anemia: Hgb 12.9 on 10/18 13. Disposition: lives alone. Daughter will be staying with him a  little when he leaves here. She called on 10/20 to discuss plan with her father. She is coming for caregiver training on Monday and Tuesday.      LOS: 5 days A FACE TO FACE EVALUATION WAS PERFORMED  Monday Shannon [name] 04/12/2020, 8:28 AM

## 2020-04-13 ENCOUNTER — Inpatient Hospital Stay (HOSPITAL_COMMUNITY): Payer: Medicare Other | Admitting: Occupational Therapy

## 2020-04-13 ENCOUNTER — Inpatient Hospital Stay (HOSPITAL_COMMUNITY): Payer: Medicare Other

## 2020-04-13 DIAGNOSIS — G959 Disease of spinal cord, unspecified: Secondary | ICD-10-CM | POA: Diagnosis not present

## 2020-04-13 NOTE — Progress Notes (Signed)
Physical Therapy Session Note  Patient Details  Name: Shannon [name] MRN: [id number] Date of Birth: 1945/12/14  Today's Date: 04/13/2020 PT Individual Time: 1045-1200 PT Individual Time Calculation (min): 75 min   Short Term Goals: Week 1:  PT Short Term Goal 1 (Week 1): Pt will perform supine<>sit with CGA PT Short Term Goal 2 (Week 1): Pt will perform sit<>stands with CGA PT Short Term Goal 3 (Week 1): Pt will perforrm bed<>chair transfers with CGA PT Short Term Goal 4 (Week 1): Pt will ambulate at least 153ft using LRAD with CGA PT Short Term Goal 5 (Week 1): Pt will ascend/descend 8 steps using R HR with CGA Week 2:     Skilled Therapeutic Interventions/Progress Updates:    PAIN  States 3/5 R shoulder, denies intervention, support of UEs provided w/mobility/gait.  Pt initially oob in armchair. SPT>3-4 min while harness donned, device adjustments made.  Assist required for UE positioning and occasionally for repositioning of RUE.  Gait Litegait 520ft x 2 w/seated rest x between efforts w/minimal bodywt support, occasional decreased R clarance, corrects w/cues, improved upright posture maintained today, overall much improved gait pattern/efficiency, decreased wobbles at knees.  Discussed significant improvements in gait stability/pattern/cadence/and efficiency in Litegait and encouraged trial w/RW. Pt argues (calmly and matter of factly) w/therapist that there is no difference in these factors either with or without device despite clear reality that there is.  Attempts to educate pt are entirely ineffective basically falling on deaf ears.  Balance:  Floor clock performed standing no AD 1/4 clock w/each LE, mult passes, mild lob posteriorly w/wbing on L  Reverse step/semilunge performed bialt no AD, min assist for balance, mild balance loss w/activity.   Gait without AD x 174ft w/min assist, decreased clearance RLE thru swing, decreased step length, mild L balance loss  w/min assist to recover, mild increased trunk flexion, decreased cadence.   Spt to bedside armchair w/cga. Pt left oob in armchair w/alarm belt set and needs in reach    Therapy Documentation Precautions:  Precautions Precautions: Cervical Precaution Booklet Issued: Yes (comment) Required Braces or Orthoses: Cervical Brace Cervical Brace: Hard collar, Other (comment) (don in sitting) Restrictions Weight Bearing Restrictions: No    Therapy/Group: Individual Therapy  Rada Hay, PT   [name] 04/13/2020, 12:34 PM

## 2020-04-13 NOTE — Progress Notes (Signed)
Patient ID: Shannon [name], male   DOB: 07/12/1945, 74 y.o.   MRN: [id number] Met with pt and daughter who was in his room to give her the private duty list. Discussed preference regarding HH agency he had none. Will see how family education goes on Monday with daughter, encouraged daughter to attend therapy with Dad this afternoon since she is here.  

## 2020-04-13 NOTE — Progress Notes (Signed)
[facility] PHYSICAL MEDICINE & REHABILITATION PROGRESS NOTE   Subjective/Complaints: Therapy notes reviewed Still has not had a BM but does not want additional laxatives. Strength is improving Does not want Tylenol- d/ced  ROS- denies CP, SOB, N/V/D,     Objective:   No results found. No results for input(s): WBC, HGB, HCT, PLT in the last 72 hours. No results for input(s): NA, K, CL, CO2, GLUCOSE, BUN, CREATININE, CALCIUM in the last 72 hours.  Intake/Output Summary (Last 24 hours) at 04/13/2020 0823 Last data filed at 04/13/2020 0700 Gross per 24 hour  Intake 680 ml  Output --  Net 680 ml        Physical Exam: Vital Signs Blood pressure 123/79, pulse 98, temperature 98.8 F (37.1 C), resp. rate 18, height 5\' 10"  (1.778 m), weight 91 kg, SpO2 94 %.   General: Alert and oriented x 3, No apparent distress HEENT: Head is normocephalic, atraumatic, PERRLA, EOMI, sclera anicteric, oral mucosa pink and moist, dentition intact, ext ear canals clear,  Neck: Supple without JVD or lymphadenopathy Heart: Reg rate and rhythm. No murmurs rubs or gallops Chest: CTA bilaterally without wheezes, rales, or rhonchi; no distress Abdomen: Soft, non-tender, non-distended, bowel sounds positive. Extremities: No clubbing, cyanosis, or edema. Pulses are 2+ Skin: Clean and intact without signs of breakdown Neurologic: motor strength is 3-/5 in bilateral deltoid,4- bicep, 3- tricep,3- grip,4- hip flexor, 4-knee extensors, 3-ankle dorsiflexor and plantar flexor, + ataxia in LEs  Musculoskeletal: no pain with ROM  range of motion in all 4 extremities. No joint swelling      Assessment/Plan: 1. 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  Physiatrist is providing close team supervision and 24 hour management of active medical problems listed below.  Physiatrist and rehab  team continue to assess barriers to discharge/monitor patient progress toward functional and medical goals  Care Tool:  Bathing    Body parts bathed by patient: Chest, Abdomen, Front perineal area, Right upper leg, Left upper leg   Body parts bathed by helper: Right lower leg, Left lower leg, Buttocks, Right arm, Left arm     Bathing assist Assist Level: Moderate Assistance - Patient 50 - 74%     Upper Body Dressing/Undressing Upper body dressing   What is the patient wearing?: Pull over shirt    Upper body assist Assist Level: Maximal Assistance - Patient 25 - 49%    Lower Body Dressing/Undressing Lower body dressing      What is the patient wearing?: Underwear/pull up, Pants     Lower body assist Assist for lower body dressing: Maximal Assistance - Patient 25 - 49%     Toileting Toileting    Toileting assist Assist for toileting: Maximal Assistance - Patient 25 - 49%     Transfers Chair/bed transfer  Transfers assist     Chair/bed transfer assist level: Minimal Assistance - Patient > 75%     Locomotion Ambulation   Ambulation assist      Assist level: Minimal Assistance - Patient > 75% Assistive device: No Device Max distance: 200   Walk 10 feet activity   Assist     Assist level: Minimal Assistance - Patient > 75% Assistive device: No Device   Walk 50 feet activity   Assist    Assist level: Minimal Assistance - Patient > 75% Assistive device: No Device    Walk 150 feet activity  Assist    Assist level: Minimal Assistance - Patient > 75% Assistive device: No Device    Walk 10 feet on uneven surface  activity   Assist     Assist level: Minimal Assistance - Patient > 75% Assistive device: Other (comment) (none)   Wheelchair     Assist Will patient use wheelchair at discharge?: No             Wheelchair 50 feet with 2 turns activity    Assist            Wheelchair 150 feet activity     Assist           Blood pressure 123/79, pulse 98, temperature 98.8 F (37.1 C), resp. rate 18, height 5\' 10"  (1.778 m), weight 91 kg, SpO2 94 %. Medical Problem List and Plan: 1.  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  2.  Antithrombotics: -DVT/anticoagulation:  SCDs             -antiplatelet therapy: N/A  3. Pain Management: Flexeril and oxycodone as needed- not using, will discontinue.              Monitor for post-op and neuropathic pain with increased exertion. Has some chronic right shoulder pain- prefers no pain medications. Well controlled without medications 4. Mood: Provide emotional support             -antipsychotic agents: N/A 5. Neuropsych: This patient is capable of making decisions on his own behalf. 6. Skin/Wound Care: Routine skin checks 7. Fluids/Electrolytes/Nutrition: Routine in and outs. CMP ordered. 8.  Hypertension.  Norvasc 10 mg daily.                Vitals:   04/12/20 1908 04/13/20 0318  BP: 125/89 123/79  Pulse: 90 98  Resp: 16 18  Temp: 98.5 F (36.9 C) 98.8 F (37.1 C)  SpO2: 99% 94%  controlled 10/21. 9. Asthma             Monitor respiratory states with activity 10.  Safety- pt without confusion, is not aware of current limitations, should improve with PT, OT  Will use telesitter until pt using call bell consistently, now has "pancake" 11. Constipation: started daily Miralax- has not yet helped. Advised to let 11/21 know if she wants more stool softeners.   10/21: still with no BM. He prefers no additional laxatives.  12. Acute blood loss anemia: Hgb 12.9 on 10/18 13. Disposition: lives alone. Daughter  will be staying with him a little when he leaves here. She called on 10/20 to discuss plan with her father. She is coming for caregiver training on Monday and Tuesday.      LOS: 6 days A FACE TO FACE EVALUATION WAS PERFORMED  [name] 04/13/2020, 8:23 AM

## 2020-04-13 NOTE — Progress Notes (Signed)
Occupational Therapy Weekly Progress Note  Patient Details  Name: [facility] [name] MRN: [id number] Date of Birth: [birth year]  Beginning of progress report period: April 08, 2020 End of progress report period: April 13, 2020  Today's Date: 04/13/2020 OT Individual Time: 4403-4742 and 1421-1502 OT Individual Time Calculation (min): 72 min and 41 min   The pt is now staying longer than originally expected, as the pt  at time of OT evaluation stated that he would not be staying at [name] for longer than a week, despite his deficits. The pt had no STGs. However, d/t his functional limitations and decreased functional use of his BUEs, the pt is now expected to have a longer stay to maximize independence, increase functional use of BUEs, and decrease caregiver burden through compensatory techniques and adaptations.   Patient continues to demonstrate the following deficits: muscle weakness, decreased cardiorespiratoy endurance, impaired timing and sequencing, unbalanced muscle activation, decreased coordination and decreased motor planning and decreased standing balance and decreased balance strategies and therefore will continue to benefit from skilled OT intervention to enhance overall performance with BADL and Reduce care partner burden.  Patient progressing toward long term goals..  Continue plan of care.  OT Short Term Goals Week 1:  OT Short Term Goal 1 (Week 1): STGs = LTGs OT Short Term Goal 1 - Progress (Week 1): Progressing toward goal Week 2:  OT Short Term Goal 1 (Week 2): STGs = LTGs d/t ELOS  Skilled Therapeutic Interventions/Progress Updates:    Pt greeted at time of session sitting up in chair agreeable to OT session, no c/o pain. Pt with new swivel spoon in room, therapist provided built up handle and coban wrapping for additional grip to compensate for decreased strength in L hand and increase I with self feeding. NMR activities for LUE including shoulder flexion/extension,  horizontal abd/add, elbow flexion/extension, forearm supination/pronation, and wrist flexion/extension using mobile arm support for shoulder AROM for 2x15 all planes/directions. Rest breaks provided as needed. Weight bearing through BUEs with push/pulls for 2x15. Note pt declined ADL, stating he washed up and changed clothes yesterday. Pt very hyperverbal and required frequent redirecting throughout session. Pt left up in chair with alarm on, call bell in reach.   Session 2: Pt greeted at time of session sitting up in chair with daughter present, remained throughout session. Discussed with daughter amount of assist the pt will need at home for hygiene during toileting, bathing, dressing, grooming, and eating given his current functional limitations in his BUEs. Unclear daughter's level of acceptance. Pt ambulated to/from bathroom with Min A and performed toilet transfer in the same manner, demonstrated clothing management for daughter to see, pt required Max A for donning doffing LB clothing. Pt transported via wheelchair to tub/shower room and therapist demonstrated TTB transfer for pt and daughter to see, unclear if pt/daughter do want one in the future, mild resistance at this time. Pt taken to gym via wheelchair and performed [facility] activities for LUE placing pegs in board. Once back in room, ambulatory transfer with Min A to bed, sit to supine Supervision. Positioned for comfort and call bell in reach, alarm on.   Therapy Documentation Precautions:  Precautions Precautions: Cervical Precaution Booklet Issued: Yes (comment) Required Braces or Orthoses: Cervical Brace Cervical Brace: Hard collar, Other (comment) (don in sitting) Restrictions Weight Bearing Restrictions: No     Therapy/Group: Individual Therapy  Erasmo Score 04/13/2020, 10:14 AM

## 2020-04-14 ENCOUNTER — Inpatient Hospital Stay (HOSPITAL_COMMUNITY): Payer: Medicare Other

## 2020-04-14 ENCOUNTER — Inpatient Hospital Stay (HOSPITAL_COMMUNITY): Payer: Medicare Other | Admitting: Occupational Therapy

## 2020-04-14 DIAGNOSIS — G959 Disease of spinal cord, unspecified: Secondary | ICD-10-CM | POA: Diagnosis not present

## 2020-04-14 NOTE — Progress Notes (Signed)
Occupational Therapy Session Note  Patient Details  Name: [name] MRN: [id number] Date of Birth: 1945/10/25  Today's Date: 04/14/2020 OT Individual Time: 1000-1102 OT Individual Time Calculation (min): 62 min   Short Term Goals: Week 2:  OT Short Term Goal 1 (Week 2): STGs = LTGs d/t ELOS  Skilled Therapeutic Interventions/Progress Updates:    Pt greeted sitting up, reporting that he hasn't had a BM in a few days, says that he isn't hungry so has low PO intake. OT suggested having crackers and peanut butter in the room and asking staff to assist as needed, as he reports he liked this snack PTA, also suggested having dtr bring in food that is more appetizing for him. Pt reported these interventions would not help him, also declined for OT to set him up with prune juice during session for promoting BM. He wanted to instead engage in bathing/dressing tasks sit<stand at the sink, declining shower. CGA for ambulatory transfer to w/c placed in front of sink without AD. Pt required Mod HOH to wash UB, used the sink counter for proximal support so he could wash face with setup and also wash most of Rt forearm unassisted. Max A for donning over head shirt and also managing his c-collar. Educated pt to wear button up shirts at home however he declined. CGA for sit<stands for LB bathing/dressing tasks. Pt able to wash upper thighs bilaterally with Mod A, Total A to wash feet, thread pants, and don Teds + shoes. Pt was able to pull underwear and pants up from lower to upper thighs with cuing while seated. Able to minimally assist with elevating pants in standing with CGA due to proximal weakness. At end of session pt ambulated back to the chair at beside with CGA and no AD. Left him with safety belt fastened and all needs within reach.   Pt very verbose throughout session, requiring cues for redirection to therapeutic tasks, minimally receptive to OT education.   Therapy Documentation Precautions:   Precautions Precautions: Cervical Precaution Booklet Issued: Yes (comment) Required Braces or Orthoses: Cervical Brace Cervical Brace: Hard collar, Other (comment) (don in sitting) Restrictions Weight Bearing Restrictions: No Pain: Pain Assessment Pain Score: 3  Pain Type: Chronic pain Pain Location: Shoulder Pain Orientation: Left Pain Descriptors / Indicators: Aching Pain Onset: On-going Pain Intervention(s): Rest;Repositioned ADL: ADL Eating: Dependent Grooming: Dependent Upper Body Bathing: Moderate assistance Where Assessed-Upper Body Bathing: Edge of bed Lower Body Bathing: Moderate assistance Where Assessed-Lower Body Bathing: Edge of bed Upper Body Dressing: Moderate assistance Where Assessed-Upper Body Dressing: Edge of bed Lower Body Dressing: Maximal assistance Where Assessed-Lower Body Dressing: Edge of bed Toileting: Maximal assistance Where Assessed-Toileting: Teacher, adult education: Curator Method: Proofreader: Raised toilet seat      Therapy/Group: Individual Therapy  Shannon [name] A Shannon [name] 04/14/2020, 12:36 PM

## 2020-04-14 NOTE — Progress Notes (Signed)
Patient refused miralax; explained importance of moving bowel regularly but still declined laxative.

## 2020-04-14 NOTE — Progress Notes (Signed)
Physical Therapy Session Note  Patient Details  Name: [facility] [name] MRN: [id number] Date of Birth: 1946/01/26  Today's Date: 04/14/2020 PT Individual Time: 1500-1525 PT Individual Time Calculation (min): 25 min   Short Term Goals: Week 1:  PT Short Term Goal 1 (Week 1): Pt will perform supine<>sit with CGA PT Short Term Goal 2 (Week 1): Pt will perform sit<>stands with CGA PT Short Term Goal 3 (Week 1): Pt will perforrm bed<>chair transfers with CGA PT Short Term Goal 4 (Week 1): Pt will ambulate at least 181ft using LRAD with CGA PT Short Term Goal 5 (Week 1): Pt will ascend/descend 8 steps using R HR with CGA  Skilled Therapeutic Interventions/Progress Updates:    Pt received sitting in chair, agreeable to PT session without c/o pain. Stand<>pivot transfer with CGA and no AD from chair to manual w/c. WC transport for time management from his room to dayroom gym. Stand<>pivot with CGA to mat table. Sit<>stand with CGA and performed standing balance tasks with BUE involvement, focusing on reaching with LUE to grasp and place cones. Pt deferring to trial with RUE 2/2 chronic rotator cuff injury(?). After seated rest, performed unsupported standing toe taps on 4 inch block requiring minA guard for balance, x2 LOB to the L while performing this. He then ambulated ~142ft with minA and no AD from day room gym back to his room. During this ambulation, x1 LOB forward due to R toe drag requiring modA for correction. Once he sat in chair, asked if he realized his LOB and pt reported he would likely not have fallen even if therapist wasn't there to catch him. Also reports increased unsteadiness with distractions. Question insight into deficits. He remained seated in chair with safety belt alarm on, needs in reach, soft call in reach.  Therapy Documentation Precautions:  Precautions Precautions: Cervical Precaution Booklet Issued: Yes (comment) Required Braces or Orthoses: Cervical Brace Cervical  Brace: Hard collar, Other (comment) (don in sitting) Restrictions Weight Bearing Restrictions: No  Therapy/Group: Individual Therapy  Kristyana Notte P Cordera Stineman PT 04/14/2020, 3:27 PM

## 2020-04-14 NOTE — Progress Notes (Signed)
[facility] PHYSICAL MEDICINE & REHABILITATION PROGRESS NOTE   Subjective/Complaints: No complaints this morning. Pain is stable- prefers no interventions Still without BM, but prefers no interventions Working well with therapy  ROS- denies CP, SOB, N/V/D,     Objective:   No results found. No results for input(s): WBC, HGB, HCT, PLT in the last 72 hours. No results for input(s): NA, K, CL, CO2, GLUCOSE, BUN, CREATININE, CALCIUM in the last 72 hours.  Intake/Output Summary (Last 24 hours) at 04/14/2020 1357 Last data filed at 04/14/2020 0900 Gross per 24 hour  Intake 240 ml  Output --  Net 240 ml        Physical Exam: Vital Signs Blood pressure 131/70, pulse (!) 102, temperature 98.5 F (36.9 C), temperature source Oral, resp. rate 18, height 5\' 10"  (1.778 m), weight 91 kg, SpO2 94 %. General: Alert and oriented x 3, No apparent distress HEENT: Head is normocephalic, atraumatic, PERRLA, EOMI, sclera anicteric, oral mucosa pink and moist, dentition intact, ext ear canals clear,  Neck: Supple without JVD or lymphadenopathy Heart: Tachycardic. No murmurs rubs or gallops Chest: CTA bilaterally without wheezes, rales, or rhonchi; no distress Abdomen: Soft, non-tender, non-distended, bowel sounds positive. Extremities: No clubbing, cyanosis, or edema. Pulses are 2+ Skin: Clean and intact without signs of breakdown  Neurologic: motor strength is 3-/5 in bilateral deltoid,4- bicep, 3- tricep,3- grip,4- hip flexor, 4-knee extensors, 3-ankle dorsiflexor and plantar flexor, + ataxia in LEs  Musculoskeletal: no pain with ROM  range of motion in all 4 extremities. No joint swelling      Assessment/Plan: 1. 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  Physiatrist is providing close team supervision and 24 hour management of active medical problems listed  below.  Physiatrist and rehab team continue to assess barriers to discharge/monitor patient progress toward functional and medical goals  Care Tool:  Bathing    Body parts bathed by patient: Chest, Abdomen, Front perineal area, Face, Right upper leg, Left upper leg   Body parts bathed by helper: Right lower leg, Left lower leg, Buttocks, Right arm, Left arm     Bathing assist Assist Level: Moderate Assistance - Patient 50 - 74%     Upper Body Dressing/Undressing Upper body dressing   What is the patient wearing?: Pull over shirt, Orthosis (c-collar) Orthosis activity level: Performed by patient  Upper body assist Assist Level: Maximal Assistance - Patient 25 - 49%    Lower Body Dressing/Undressing Lower body dressing      What is the patient wearing?: Underwear/pull up, Pants     Lower body assist Assist for lower body dressing: Maximal Assistance - Patient 25 - 49%     Toileting Toileting    Toileting assist Assist for toileting: Maximal Assistance - Patient 25 - 49%     Transfers Chair/bed transfer  Transfers assist     Chair/bed transfer assist level: Minimal Assistance - Patient > 75%     Locomotion Ambulation   Ambulation assist      Assist level: Minimal Assistance - Patient > 75% Assistive device: No Device Max distance: 100   Walk 10 feet activity   Assist     Assist level: Minimal Assistance - Patient > 75% Assistive device: No Device   Walk 50 feet activity   Assist    Assist level: Minimal Assistance - Patient > 75% Assistive device: No Device  Walk 150 feet activity   Assist    Assist level: Minimal Assistance - Patient > 75% Assistive device: No Device    Walk 10 feet on uneven surface  activity   Assist     Assist level: Minimal Assistance - Patient > 75% Assistive device: Other (comment) (none)   Wheelchair     Assist Will patient use wheelchair at discharge?: No             Wheelchair 50  feet with 2 turns activity    Assist            Wheelchair 150 feet activity     Assist          Blood pressure 131/70, pulse (!) 102, temperature 98.5 F (36.9 C), temperature source Oral, resp. rate 18, height 5\' 10"  (1.778 m), weight 91 kg, SpO2 94 %. Medical Problem List and Plan: 1.  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  2.  Antithrombotics: -DVT/anticoagulation:  SCDs             -antiplatelet therapy: N/A  3. Pain Management: Flexeril and oxycodone as needed- not using, will discontinue.              Monitor for post-op and neuropathic pain with increased exertion. Has some chronic right shoulder pain- prefers no pain medications. Stable without medications.  4. Mood: Provide emotional support             -antipsychotic agents: N/A 5. Neuropsych: This patient is capable of making decisions on his own behalf. 6. Skin/Wound Care: Routine skin checks 7. Fluids/Electrolytes/Nutrition: Routine in and outs. CMP ordered. 8.  Hypertension.  Norvasc 10 mg daily.                Vitals:   04/13/20 1926 04/14/20 0315  BP: 111/70 131/70  Pulse: 87 (!) 102  Resp: 18 18  Temp: 98.5 F (36.9 C) 98.5 F (36.9 C)  SpO2: 96% 94%  Controlled 10/22.  9. Asthma             Monitor respiratory states with activity 10.  Safety- pt without confusion, is not aware of current limitations, should improve with PT, OT  Will use telesitter until pt using call bell consistently, now has "pancake" 11. Constipation: started daily Miralax- has not yet helped. Advised to let 11/22 know if she wants more stool softeners.   10/22: still  with no BM. He prefers no additional laxatives.  12. Acute blood loss anemia: Hgb 12.9 on 10/18 13. Disposition: lives alone. Daughter will be staying with him a little when he leaves here. She called on 10/20 to discuss plan with her father. She is coming for caregiver training on Monday and Tuesday.      LOS: 7 days A FACE TO FACE EVALUATION WAS PERFORMED  Thursday Shaka Zech 04/14/2020, 1:57 PM

## 2020-04-14 NOTE — Progress Notes (Signed)
Physical Therapy Session Note  Patient Details  Name: Shannon [name] MRN: [id number] Date of Birth: March 03, 1946  Today's Date: 04/14/2020 PT Individual Time: 0830-0930 PT Individual Time Calculation (min): 60 min   Short Term Goals: Week 1:  PT Short Term Goal 1 (Week 1): Pt will perform supine<>sit with CGA PT Short Term Goal 2 (Week 1): Pt will perform sit<>stands with CGA PT Short Term Goal 3 (Week 1): Pt will perforrm bed<>chair transfers with CGA PT Short Term Goal 4 (Week 1): Pt will ambulate at least 17ft using LRAD with CGA PT Short Term Goal 5 (Week 1): Pt will ascend/descend 8 steps using R HR with CGA  Skilled Therapeutic Interventions/Progress Updates:    Patient in bathroom initially, NT assisted for hygiene and pt ambulated to chair in room with min A.  Assist to don TED's, shorts and shoes.  Patient sit to stand CGA pt bracing with legs against the chair as coming up to stand.  Patient ambulated to ortho gym x 100' with min A.  Performed car transfer with min A.  Negotiated ramp with min A curb with rail min A with one UE for ascent and two UE's for descent.  Patient sit to sidelying min A with assist for positioning for safety performed clamshell hip abduction 10x 2 sets, in supine bridging x 10 w/ 3 sec hold x 2 sets and clamshell on other side.  Performed supine to sit min A.  Sit <>stand x 10 slight elevation to seat for hips above knees with CGA cues for anterior weight shift and controlled lowering to sit.  Patient standing balance alternating taps to 4" step x 10 with min A and assist to stabilize step x 2 sets.  Ambulated to room with CGA to min A no device one LOB catching L foot min A recovery.  Left in chair in room with seat belt alarm activated and needs in reach.  Therapy Documentation Precautions:  Precautions Precautions: Cervical Precaution Booklet Issued: Yes (comment) Required Braces or Orthoses: Cervical Brace Cervical Brace: Hard collar, Other (comment)  (don in sitting) Restrictions Weight Bearing Restrictions: No Pain: Pain Assessment Pain Score: 3  Pain Type: Chronic pain Pain Location: Shoulder Pain Orientation: Left Pain Descriptors / Indicators: Aching Pain Onset: On-going Pain Intervention(s): Rest;Repositioned    Therapy/Group: Individual Therapy  Elray Mcgregor  Hardy, PT 04/14/2020, 8:23 AM

## 2020-04-15 ENCOUNTER — Inpatient Hospital Stay (HOSPITAL_COMMUNITY): Payer: Medicare Other | Admitting: Occupational Therapy

## 2020-04-15 DIAGNOSIS — I10 Essential (primary) hypertension: Secondary | ICD-10-CM

## 2020-04-15 DIAGNOSIS — K5901 Slow transit constipation: Secondary | ICD-10-CM

## 2020-04-15 DIAGNOSIS — D62 Acute posthemorrhagic anemia: Secondary | ICD-10-CM

## 2020-04-15 DIAGNOSIS — G8250 Quadriplegia, unspecified: Secondary | ICD-10-CM

## 2020-04-15 NOTE — Plan of Care (Signed)
  Problem: RH Dressing Goal: LTG Patient will perform lower body dressing w/assist (OT) Description: LTG: Patient will perform lower body dressing with assist, with/without cues in positioning using equipment (OT) Flowsheets (Taken 04/15/2020 1620) LTG: Pt will perform lower body dressing with assistance level of: Maximal Assistance - Patient 25 - 49% Note: Downgraded due to slow progress   Problem: RH Toileting Goal: LTG Patient will perform toileting task (3/3 steps) with assistance level (OT) Description: LTG: Patient will perform toileting task (3/3 steps) with assistance level (OT)  Flowsheets (Taken 04/15/2020 1620) LTG: Pt will perform toileting task (3/3 steps) with assistance level: Maximal Assistance - Patient 25 - 49% Note: Downgraded due to slow progress

## 2020-04-15 NOTE — Progress Notes (Signed)
[place] PHYSICAL MEDICINE & REHABILITATION PROGRESS NOTE   Subjective/Complaints: Patient seen sitting up in a chair in breakfast this morning.  He states he slept well overnight.  He denies complaints.  ROS: Denies CP, SOB, N/V/D,   Objective:   No results found. No results for input(s): WBC, HGB, HCT, PLT in the last 72 hours. No results for input(s): NA, K, CL, CO2, GLUCOSE, BUN, CREATININE, CALCIUM in the last 72 hours.  Intake/Output Summary (Last 24 hours) at 04/15/2020 1503 Last data filed at 04/15/2020 1355 Gross per 24 hour  Intake 600 ml  Output --  Net 600 ml        Physical Exam: Vital Signs Blood pressure 123/82, pulse 99, temperature 98.4 F (36.9 C), resp. rate 17, height 5\' 10"  (1.778 m), weight 91 kg, SpO2 94 %. Constitutional: No distress . Vital signs reviewed. HENT: Normocephalic.  Atraumatic. Eyes: EOMI. No discharge. Cardiovascular: No JVD.  RRR. Respiratory: Normal effort.  No stridor.  Bilateral clear to auscultation. GI: Non-distended.  BS +. Skin: Warm and dry.  Intact. Psych: Normal mood.  Normal behavior. Musc: No edema in extremities.  No tenderness in extremities. Neurologic: Alert RUE: Shoulder abduction 2/5, elbow flexion/extension 3+/5, handgrip 4+/5 LUE: Shoulder abduction 3/5, elbow flexion/extension 3 -/5, handgrip 3/5, some improvement Bilateral lower extremity: Hip flexion, knee extension 4/5, ankle dorsiflexion 4 -/5   Assessment/Plan: 1. 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  Physiatrist is providing close team supervision and 24 hour management of active medical problems listed below.  Physiatrist and rehab team continue to assess barriers to discharge/monitor patient progress toward functional and medical goals  Care Tool:  Bathing    Body parts bathed by patient: Chest, Abdomen, Front perineal  area, Face, Right upper leg, Left upper leg   Body parts bathed by helper: Right lower leg, Left lower leg, Buttocks, Right arm, Left arm     Bathing assist Assist Level: Moderate Assistance - Patient 50 - 74%     Upper Body Dressing/Undressing Upper body dressing   What is the patient wearing?: Pull over shirt, Orthosis (c-collar) Orthosis activity level: Performed by patient  Upper body assist Assist Level: Maximal Assistance - Patient 25 - 49%    Lower Body Dressing/Undressing Lower body dressing      What is the patient wearing?: Underwear/pull up, Pants     Lower body assist Assist for lower body dressing: Maximal Assistance - Patient 25 - 49%     Toileting Toileting    Toileting assist Assist for toileting: Maximal Assistance - Patient 25 - 49%     Transfers Chair/bed transfer  Transfers assist     Chair/bed transfer assist level: Minimal Assistance - Patient > 75%     Locomotion Ambulation   Ambulation assist      Assist level: Minimal Assistance - Patient > 75% Assistive device: No Device Max distance: 100   Walk 10 feet activity   Assist     Assist level: Minimal Assistance - Patient > 75% Assistive device: No Device   Walk 50 feet activity   Assist    Assist level: Minimal Assistance - Patient > 75% Assistive device: No Device    Walk 150 feet activity   Assist    Assist level: Minimal Assistance - Patient > 75% Assistive device: No Device    Walk 10 feet on uneven surface  activity  Assist     Assist level: Minimal Assistance - Patient > 75% Assistive device: Other (comment) (none)   Wheelchair     Assist Will patient use wheelchair at discharge?: No             Wheelchair 50 feet with 2 turns activity    Assist            Wheelchair 150 feet activity     Assist          Medical Problem List and Plan: 1.  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  Antithrombotics: -DVT/anticoagulation:  SCDs             -antiplatelet therapy: N/A  3. Pain Management: Flexeril and oxycodone DC'd             Monitor for post-op and neuropathic pain with increased exertion. Has some chronic right shoulder pain- prefers no pain medications.   Controlled on 10/23 4. Mood: Provide emotional support             -antipsychotic agents: N/A 5. Neuropsych: This patient is capable of making decisions on his own behalf. 6. Skin/Wound Care: Routine skin checks 7. Fluids/Electrolytes/Nutrition: Routine in and outs.  8.  Hypertension.  Norvasc 10 mg daily.                Vitals:   04/14/20 1935 04/15/20 0353  BP: 123/86 123/82  Pulse: 98 99  Resp: 15 17  Temp: 98.3 F (36.8 C) 98.4 F (36.9 C)  SpO2: 97% 94%   Controlled on 10/23 9. Asthma             Monitor respiratory states with activity  Controlled on 10/23 10.  Safety- pt without confusion, is not aware of current limitations, should improve with PT, OT  Will use telesitter until pt using call bell consistently, now has "pancake" 11. Constipation: started daily Miralax, does not want the medication adjustment  Improving 12.  Mild acute blood loss anemia:   Hemoglobin 12.9 on 10/18 13. Disposition: lives alone. Daughter will be staying with him a little when he leaves here. She called on 10/20 to discuss plan with her father. She is coming for caregiver training on Monday and Tuesday.      LOS: 8 days A FACE TO FACE EVALUATION WAS PERFORMED  Puanani Gene Karis Juba 04/15/2020, 3:03 PM

## 2020-04-15 NOTE — Progress Notes (Signed)
Occupational Therapy Session Note  Patient Details  Name: Shannon [name] MRN: [id number] Date of Birth: [birth year]  Today's Date: 04/15/2020 OT Individual Time: 1449-1531 OT Individual Time Calculation (min): 42 min   Short Term Goals: Week 1:  OT Short Term Goal 1 (Week 1): STGs = LTGs OT Short Term Goal 1 - Progress (Week 1): Progressing toward goal  Skilled Therapeutic Interventions/Progress Updates:    Pt greeted in bed with no c/o pain, agreeable to session and ADL needs met. Supine<sit from flat bed without bedrails completed with close supervision assistance as pt used momentum to anterior weight shift with trunk. He reports his bed has this setup at home. Pt wanted to try using the RW today, CGA for ambulatory transfer into the bathroom using device and then pt transferred to the toilet, vcs for decreasing his speed for safety. While sitting on the elevated toilet, pt reported that he didn't like using the RW in the past because he perceived the cues he was receiving from therapists to be increasing his overall fall risk. OT advised him to just "walk in a way that feels safe," and for remainder of session pt ambulated with close supervision using the RW, able to lift each extremity up onto the handles as needed. We discussed benefits of using RW for increasing proximal strength. He transferred to the w/c parked beside sink and we problem solved oral care. Pt able to dispense toothpaste into his mouth by leaning trunk forward and then lift each UE onto the sink to use the toothbrush while he bent forward to brush/spit. OT assisted him with managing faucet levers/rinse cup/mouth wash, requiring overall Mod A to meet demands of task. To rinse mouth after, OT placed a damp cloth on the sink counter and pt bent forward to wipe mouth. We discussed having pt direct his own care in regards to placement of ADL items to increase his functional independence at home. Pt then used the RW with supervision  assist to transfer back into bed, height of bed elevated quite a bit to simulate his bed at home. Pt able to transfer into bed with setup assistance after we doffed in c-collar. Pt remained in bed at close of session with all needs within reach and bed alarm set.   Therapy Documentation Precautions:  Precautions Precautions: Cervical Precaution Booklet Issued: Yes (comment) Required Braces or Orthoses: Cervical Brace Cervical Brace: Hard collar, Other (comment) (don in sitting) Restrictions Weight Bearing Restrictions: No Vital Signs: Therapy Vitals Temp: 100 F (37.8 C) Temp Source: Oral Pulse Rate: 87 Resp: 14 BP: 110/70 Patient Position (if appropriate): Lying Oxygen Therapy SpO2: 98 % O2 Device: Room Air ADL: ADL Eating: Dependent Grooming: Dependent Upper Body Bathing: Moderate assistance Where Assessed-Upper Body Bathing: Edge of bed Lower Body Bathing: Moderate assistance Where Assessed-Lower Body Bathing: Edge of bed Upper Body Dressing: Moderate assistance Where Assessed-Upper Body Dressing: Edge of bed Lower Body Dressing: Maximal assistance Where Assessed-Lower Body Dressing: Edge of bed Toileting: Maximal assistance Where Assessed-Toileting: Glass blower/designer: Psychiatric nurse Method: Counselling psychologist: Raised toilet seat   Therapy/Group: Individual Therapy  Anaijah Augsburger A Sherin Murdoch 04/15/2020, 4:09 PM

## 2020-04-16 ENCOUNTER — Inpatient Hospital Stay (HOSPITAL_COMMUNITY): Payer: Medicare Other | Admitting: Physical Therapy

## 2020-04-16 DIAGNOSIS — J45909 Unspecified asthma, uncomplicated: Secondary | ICD-10-CM

## 2020-04-16 DIAGNOSIS — G8918 Other acute postprocedural pain: Secondary | ICD-10-CM

## 2020-04-16 NOTE — Plan of Care (Signed)
  Problem: RH SKIN INTEGRITY Goal: RH STG SKIN FREE OF INFECTION/BREAKDOWN Description: Maintain incision site free of infection with mod I asssist Outcome: Progressing   Problem: RH SAFETY Goal: RH STG ADHERE TO SAFETY PRECAUTIONS W/ASSISTANCE/DEVICE Description: STG Adhere to Safety Precautions With mod I assist. Outcome: Progressing

## 2020-04-16 NOTE — Progress Notes (Addendum)
[place] PHYSICAL MEDICINE & REHABILITATION PROGRESS NOTE   Subjective/Complaints: Patient seen sitting up this AM.  He states he slept well overnight.  He denies complaints. He states that he was told that he would have the day off on Sunday, explained that therapies would be less than the weekday, but has a session scheduled.  ROS: Denies CP, SOB, N/V/D,   Objective:   No results found. No results for input(s): WBC, HGB, HCT, PLT in the last 72 hours. No results for input(s): NA, K, CL, CO2, GLUCOSE, BUN, CREATININE, CALCIUM in the last 72 hours.  Intake/Output Summary (Last 24 hours) at 04/16/2020 1301 Last data filed at 04/16/2020 0700 Gross per 24 hour  Intake 541 ml  Output --  Net 541 ml       Physical Exam: Vital Signs Blood pressure 119/82, pulse 93, temperature 98 F (36.7 C), resp. rate 16, height 5\' 10"  (1.778 m), weight 91 kg, SpO2 95 %. Constitutional: No distress . Vital signs reviewed. HENT: Normocephalic.  Atraumatic. Eyes: EOMI. No discharge. Cardiovascular: No JVD.  RRR. Respiratory: Normal effort.  No stridor.  Bilateral clear to auscultation. GI: Non-distended.  BS +. Skin: Warm and dry.  Intact. Psych: Normal mood.  Normal behavior. Musc: No edema in extremities.  No tenderness in extremities. Neurologic: Alert RUE: Shoulder abduction 2/5, elbow flexion/extension 3+/5, handgrip 4+/5, appears unchanged LUE: Shoulder abduction 3/5, elbow flexion/extension 3 -/5, handgrip 3/5, some improvement Bilateral lower extremity: Hip flexion, knee extension 4/5, ankle dorsiflexion 4 -/5   Assessment/Plan: 1. 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  Physiatrist is providing close team supervision and 24 hour management of active medical problems listed below.  Physiatrist and rehab team continue to assess barriers to discharge/monitor patient  progress toward functional and medical goals  Care Tool:  Bathing    Body parts bathed by patient: Chest, Abdomen, Front perineal area, Face, Right upper leg, Left upper leg   Body parts bathed by helper: Right lower leg, Left lower leg, Buttocks, Right arm, Left arm     Bathing assist Assist Level: Moderate Assistance - Patient 50 - 74%     Upper Body Dressing/Undressing Upper body dressing   What is the patient wearing?: Pull over shirt, Orthosis (c-collar) Orthosis activity level: Performed by patient  Upper body assist Assist Level: Maximal Assistance - Patient 25 - 49%    Lower Body Dressing/Undressing Lower body dressing      What is the patient wearing?: Underwear/pull up, Pants     Lower body assist Assist for lower body dressing: Maximal Assistance - Patient 25 - 49%     Toileting Toileting    Toileting assist Assist for toileting: Maximal Assistance - Patient 25 - 49%     Transfers Chair/bed transfer  Transfers assist     Chair/bed transfer assist level: Minimal Assistance - Patient > 75%     Locomotion Ambulation   Ambulation assist      Assist level: Contact Guard/Touching assist Assistive device: No Device Max distance: 150   Walk 10 feet activity   Assist     Assist level: Contact Guard/Touching assist Assistive device: No Device   Walk 50 feet activity   Assist    Assist level: Contact Guard/Touching assist Assistive device: No Device    Walk 150 feet activity   Assist    Assist level: Contact Guard/Touching assist Assistive device: No Device  Walk 10 feet on uneven surface  activity   Assist     Assist level: Minimal Assistance - Patient > 75% Assistive device: Other (comment) (none)   Wheelchair     Assist Will patient use wheelchair at discharge?: No             Wheelchair 50 feet with 2 turns activity    Assist            Wheelchair 150 feet activity     Assist           Medical Problem List and Plan: 1.  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  Antithrombotics: -DVT/anticoagulation:  SCDs             -antiplatelet therapy: N/A  3. Pain Management: Flexeril and oxycodone DC'd             Monitor for post-op and neuropathic pain with increased exertion. Has some chronic right shoulder pain- prefers no pain medications.   Controlled on 10/24 4. Mood: Provide emotional support             -antipsychotic agents: N/A 5. Neuropsych: This patient is capable of making decisions on his own behalf. 6. Skin/Wound Care: Routine skin checks 7. Fluids/Electrolytes/Nutrition: Routine in and outs.  8.  Hypertension.  Norvasc 10 mg daily.                Vitals:   04/15/20 1908 04/16/20 0307  BP: 116/80 119/82  Pulse: 98 93  Resp: 17 16  Temp: 99.5 F (37.5 C) 98 F (36.7 C)  SpO2: 95% 95%   Controlled on 10/24 9. Asthma             Monitor respiratory states with activity  Controlled on 10/24 10.  Safety- pt without confusion, is not aware of current limitations, should improve with PT, OT  Will use telesitter until pt using call bell consistently, now has "pancake" 11. Constipation: started daily Miralax, does not want the medication adjustment  Improving 12.  Mild acute blood loss anemia:   Hemoglobin 12.9 on 10/18 13. Disposition: lives alone. Daughter will be staying with him a little when he leaves here. She called on 10/20 to discuss plan with her father. She is coming for caregiver training on Monday and Tuesday.      LOS: 9 days A FACE TO FACE EVALUATION WAS PERFORMED  Shannon [name] Karis Juba 04/16/2020, 1:01 PM

## 2020-04-16 NOTE — Progress Notes (Signed)
Physical Therapy Session Note  Patient Details  Name: Shannon [name] MRN: [id number] Date of Birth: February 28, 1946  Today's Date: 04/16/2020 PT Individual Time: 0946-1100 PT Individual Time Calculation (min): 74 min   Short Term Goals: Week 1:  PT Short Term Goal 1 (Week 1): Pt will perform supine<>sit with CGA PT Short Term Goal 2 (Week 1): Pt will perform sit<>stands with CGA PT Short Term Goal 3 (Week 1): Pt will perforrm bed<>chair transfers with CGA PT Short Term Goal 4 (Week 1): Pt will ambulate at least 182ft using LRAD with CGA PT Short Term Goal 5 (Week 1): Pt will ascend/descend 8 steps using R HR with CGA  Skilled Therapeutic Interventions/Progress Updates:  Pt was seen bedside in the am. Pt transferred supine to edge of bed with min A and verbal cues. Donned collar at edge of bed. Pt performed stand pivot to w/c with min A and verbal cues. Pt transported to rehab gym. Pt performed multiple sit to stand transfers with c/g and stand pivot transfers with c/g to min A. Pt ambulated 150 feet without assistive device and c/g with verbal cues. Pt ambulated 125 and 25 with st cane and c/g to min A. Pt demonstrated improved base of support with cane. Pt returned to room following treatment. Pt transferred w/c to edge of bed with min A and verbal cues. Doffed collar on edge of bed. Pt transferred edge of bed to supine with min A. Pt left sitting up in bed with bed alarm and all needs within reach.   Therapy Documentation Precautions:  Precautions Precautions: Cervical Precaution Booklet Issued: Yes (comment) Required Braces or Orthoses: Cervical Brace Cervical Brace: Hard collar, Other (comment) (don in sitting) Restrictions Weight Bearing Restrictions: No General:   Pain: Pain Assessment Pain Scale: 0-10 Pain Score: 0-No pain  Therapy/Group: Individual Therapy  Rayford Halsted 04/16/2020, 12:35 PM

## 2020-04-17 ENCOUNTER — Encounter (HOSPITAL_COMMUNITY): Payer: Medicare Other | Admitting: Occupational Therapy

## 2020-04-17 ENCOUNTER — Inpatient Hospital Stay (HOSPITAL_COMMUNITY): Payer: Medicare Other | Admitting: Occupational Therapy

## 2020-04-17 ENCOUNTER — Ambulatory Visit (HOSPITAL_COMMUNITY): Payer: Medicare Other

## 2020-04-17 MED ORDER — POLYETHYLENE GLYCOL 3350 17 G PO PACK: 17.0000 g | PACK | Freq: Every day | ORAL | Status: DC | PRN

## 2020-04-17 NOTE — Progress Notes (Signed)
Patient ID: Shannon [name], male   DOB: 21-Mar-1946, 74 y.o.   MRN: [id number]  Met with pt and daughter who is here for education, both expressed it is going well and he is doing better today. Discussed getting a rollator rolling walker for home and both were in agreement with this. They wanted to wait on a tub bench undecided about this. OT will make sure has information on this if decide to get at a later date. Have set up [facility] for home health services. Daughter to come back tomorrow for more education prior to discharge home

## 2020-04-17 NOTE — Progress Notes (Signed)
Occupational Therapy Session Note  Patient Details  Name: Shannon [name] MRN: [id number] Date of Birth: Jul 13, 1945  Today's Date: 04/17/2020 OT Individual Time: 1300-1413 OT Individual Time Calculation (min): 73 min   Short Term Goals: Week 2:  OT Short Term Goal 1 (Week 2): STGs = LTGs d/t ELOS  Skilled Therapeutic Interventions/Progress Updates:    Pt greeted sitting up in the chair, reporting pain to be manageable for tx without additional interventions. He stated he was done eating, appeared he ate just a few bites at most. Per pt, he still has no appetite. OT encouraged him to increase PO intake however he refused at this time. OT then donned c-collar prior to mobility. Started with reviewing his UE HEP that had just been printed. Pt engaged in pendulum, modified isometric, and A/AROM exercises with guided instruction, ambulating in room as needed without AD with Min A. Discussed ways he could engage in HEP exercises at home (I.e. standing near elevated counter in kitchen for pendulums, or using a supportive dresser or windowsill for isometric downward pushes). Pt somewhat receptive to education, states that he has a boflex at home and other pieces of exercise equipment that he plans to use, modifying exercises using equipment as he sees appropriate. He then reported needing to use the restroom. Ambulatory transfer to toilet completed using rollator with CGA-CS, vcs for locking brakes at appropriate times. Max A for toileting tasks with pt having continent void of bowels, pt able to assist a little with clothing mgt. Pt then transferred to bed and was left with all needs within reach and bed alarm set.  Therapy Documentation Precautions:  Precautions Precautions: Cervical Precaution Booklet Issued: Yes (comment) Required Braces or Orthoses: Cervical Brace Cervical Brace: Hard collar, Other (comment) (don in sitting) Restrictions Weight Bearing Restrictions: No Vital Signs: Therapy  Vitals Temp: 98.3 F (36.8 C) Pulse Rate: 90 Resp: 17 BP: 116/68 Patient Position (if appropriate): Lying Oxygen Therapy SpO2: 94 % ADL:      Therapy/Group: Individual Therapy  Margues Filippini A Aureliano Oshields 04/17/2020, 3:46 PM

## 2020-04-17 NOTE — Progress Notes (Signed)
[place] PHYSICAL MEDICINE & REHABILITATION PROGRESS NOTE   Subjective/Complaints:  Pt reports pain controlled- now using bathroom, having BMs- wants laxative prn- has been refusing them; taking stool softeners- voiding well/ using deltoid aid to feed self.   Sitting up in bedside chair   ROS:  Pt denies SOB, abd pain, CP, N/V/C/D, and vision changes   Objective:   No results found. No results for input(s): WBC, HGB, HCT, PLT in the last 72 hours. No results for input(s): NA, K, CL, CO2, GLUCOSE, BUN, CREATININE, CALCIUM in the last 72 hours.  Intake/Output Summary (Last 24 hours) at 04/17/2020 0848 Last data filed at 04/17/2020 0815 Gross per 24 hour  Intake 600 ml  Output -  Net 600 ml       Physical Exam: Vital Signs Blood pressure 127/73, pulse 96, temperature 98.3 F (36.8 C), resp. rate 16, height 5\' 10"  (1.778 m), weight 91 kg, SpO2 96 %. Constitutional: sitting up in bedside chair, appropriate, using deltoid aid to feed self with good use, NAD HENT: Normocephalic.  Atraumatic. Eyes: EOMI. No discharge. Cardiovascular: RRR Respiratory: CTA B/L- no W/R/R- good air movement GI: Soft, NT, ND, (+)BS  Skin: Warm and dry.  Intact. Psych: appropriate Musc: No edema in extremities.  No tenderness in extremities. Neurologic: Alert RUE: Shoulder abduction 2/5, elbow flexion/extension 3+/5, handgrip 4+/5, appears unchanged LUE: Shoulder abduction 3/5, elbow flexion/extension 3 -/5, handgrip 3/5, some improvement Bilateral lower extremity: Hip flexion, knee extension 4/5, ankle dorsiflexion 4 -/5   Assessment/Plan: 1. 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  Physiatrist is providing close team supervision and 24 hour management of active medical problems listed below.  Physiatrist and rehab team continue to assess barriers to discharge/monitor  patient progress toward functional and medical goals  Care Tool:  Bathing    Body parts bathed by patient: Chest, Abdomen, Front perineal area, Face, Right upper leg, Left upper leg   Body parts bathed by helper: Right lower leg, Left lower leg, Buttocks, Right arm, Left arm     Bathing assist Assist Level: Moderate Assistance - Patient 50 - 74%     Upper Body Dressing/Undressing Upper body dressing   What is the patient wearing?: Pull over shirt, Orthosis (c-collar) Orthosis activity level: Performed by patient  Upper body assist Assist Level: Maximal Assistance - Patient 25 - 49%    Lower Body Dressing/Undressing Lower body dressing      What is the patient wearing?: Underwear/pull up, Pants     Lower body assist Assist for lower body dressing: Maximal Assistance - Patient 25 - 49%     Toileting Toileting    Toileting assist Assist for toileting: Maximal Assistance - Patient 25 - 49%     Transfers Chair/bed transfer  Transfers assist     Chair/bed transfer assist level: Minimal Assistance - Patient > 75%     Locomotion Ambulation   Ambulation assist      Assist level: Contact Guard/Touching assist Assistive device: No Device Max distance: 150   Walk 10 feet activity   Assist     Assist level: Contact Guard/Touching assist Assistive device: No Device   Walk 50 feet activity   Assist    Assist level: Contact Guard/Touching assist Assistive device: No Device    Walk 150 feet activity   Assist    Assist level: Contact Guard/Touching assist Assistive device: No Device  Walk 10 feet on uneven surface  activity   Assist     Assist level: Minimal Assistance - Patient > 75% Assistive device: Other (comment) (none)   Wheelchair     Assist Will patient use wheelchair at discharge?: No             Wheelchair 50 feet with 2 turns activity    Assist            Wheelchair 150 feet activity     Assist           Medical Problem List and Plan: 1.  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  Antithrombotics: -DVT/anticoagulation:  SCDs             -antiplatelet therapy: N/A  3. Pain Management: Flexeril and oxycodone DC'd             Monitor for post-op and neuropathic pain with increased exertion. Has some chronic right shoulder pain- prefers no pain medications.   10/25- controlled with no meds- con't regimen 4. Mood: Provide emotional support             -antipsychotic agents: N/A 5. Neuropsych: This patient is capable of making decisions on his own behalf. 6. Skin/Wound Care: Routine skin checks 7. Fluids/Electrolytes/Nutrition: Routine in and outs.  8.  Hypertension.  Norvasc 10 mg daily.                Vitals:   04/16/20 1939 04/17/20 0450  BP: (!) 106/58 127/73  Pulse: 91 96  Resp: 18 16  Temp: 98.6 F (37 C) 98.3 F (36.8 C)  SpO2: 99% 96%   10/25- BP controlled- con't regimen 9. Asthma             Monitor respiratory states with activity  Controlled on 10/24 10.  Safety- pt without confusion, is not aware of current limitations, should improve with PT, OT  Will use telesitter until pt using call bell consistently, now has "pancake" 11. Constipation: started daily Miralax, does not want the medication adjustment  Improving  10/25- will con't stool softeners- refusing laxatives-  12.  Mild acute blood loss anemia:   Hemoglobin 12.9 on 10/18 13. Disposition: lives alone. Daughter will be staying with him a little when he leaves here. She called on 10/20 to discuss plan with her father. She is coming for caregiver training on Monday and Tuesday.      LOS: 10 days A FACE TO FACE EVALUATION WAS PERFORMED  Diyana Starrett 04/17/2020, 8:48 AM

## 2020-04-17 NOTE — Progress Notes (Signed)
Occupational Therapy Session Note  Patient Details  Name: Shannon [name] MRN: [id number] Date of Birth: 1945-09-11  Today's Date: 04/17/2020 OT Individual Time: 6270-3500 OT Individual Time Calculation (min): 61 min    Short Term Goals: Week 2:  OT Short Term Goal 1 (Week 2): STGs = LTGs d/t ELOS   Skilled Therapeutic Interventions/Progress Updates:    Pt greeted at time of session reclined in bed, waking up and agreeable to OT session. Daughter Shannon [name] not present at beginning of session but did join later on. Supine to sit EOB Min A with HOB elevated, donned collar dependently as pt has limited shoulder ROM. Stand pivot to wheelchair with RW CGA, set up at sink level and doffed clothes with assist to maneuver around collar. UB bathing Mod A, pt able to wash with adaptive technique using washcloth like a glove wrapped around hand, LB bathing Mod A as well pt able to wash upper thigh and periarea in standing, assit for buttocks and past knee level, also assist for thoroughness. Donned underwear/brief with Max A, pt able to have some grip with digits to help pull up to thigh level. Daughter entered at this time, trained to don/doff c collar which she performed with supervision, also reviewed how to wash and replace pads. Daughter assisted with donning shirt, max A. LB dressing with daughter assist also Max A in same manner as brief. Pt performed oral care with adaptive technique of propping LUE on counter surface, Min-Mod. Pt up in wheelchair with alarm on, call bell in reach and daughter present.   Therapy Documentation Precautions:  Precautions Precautions: Cervical Precaution Booklet Issued: Yes (comment) Required Braces or Orthoses: Cervical Brace Cervical Brace: Hard collar, Other (comment) (don in sitting) Restrictions Weight Bearing Restrictions: No    Therapy/Group: Individual Therapy  Erasmo Score 04/17/2020, 10:34 AM

## 2020-04-17 NOTE — Progress Notes (Signed)
Physical Therapy Session Note  Patient Details  Name: Shannon [name] MRN: [id number] Date of Birth: 1946-02-12  Today's Date: 04/17/2020 PT Individual Time: 1015-1115 PT Individual Time Calculation (min): 60 min   Short Term Goals: Week 1:  PT Short Term Goal 1 (Week 1): Pt will perform supine<>sit with CGA PT Short Term Goal 2 (Week 1): Pt will perform sit<>stands with CGA PT Short Term Goal 3 (Week 1): Pt will perforrm bed<>chair transfers with CGA PT Short Term Goal 4 (Week 1): Pt will ambulate at least 140ft using LRAD with CGA PT Short Term Goal 5 (Week 1): Pt will ascend/descend 8 steps using R HR with CGA  Skilled Therapeutic Interventions/Progress Updates:    Patient seen with daughter present for caregiver education with mobility skills.  Discussed options for walking device and need for assistance anyway due to UE weakness and that pt had preferred not to use a device due to it felt too restrictive.  Initiated conversation about trialing rollator since less restrictive and has seat if needed with fatigue.  Patient willing and brought to room.  Assist to don TEDs and shoes seated edge of chair with total assist, discussed with daughter getting a shoe horn for ease of helping with shoes.  Set up and education for locking brakes, pt sit to stand min A cues for foot placement, anterior weight shift.  Patient ambulated to ortho gym x 100' with rollator min A cues for safety.  Demonstrated car transfer and ramp and daughter able to return demonstrate appropriately, though noted increased level of assistance than needed.  Discussed okay to give too much right now, but may back off some as pt may not need or allow her to help so much.  Patient ambulated with rollator to general gym with daughter supporting and providing better amount of assistance.  Demonstrated to daughter method for stair negotiation and pt performed with daughter assistance with cues for hand placement on hip for ascending and  descending with side method one handrail on R.  Pt negotiated 8 steps cues for keeping feet wide.  Patient ambulated to room x 150' with min A with rollator.  Practiced sit<>supine with min A for lifting trunk from supine and mod A for lifting feet to supine.  Educated daughter to assist with legs while pt lying down not to lift from "dead lift" position. Assisted pt to bathroom no device min A and max A for clothing management.  Left in bed with needs in reach and daughter in the room.   Therapy Documentation Precautions:  Precautions Precautions: Cervical Precaution Booklet Issued: Yes (comment) Required Braces or Orthoses: Cervical Brace Cervical Brace: Hard collar, Other (comment) (don in sitting) Restrictions Weight Bearing Restrictions: No Pain: Pain Assessment Pain Score: 4  Pain Type: Chronic pain Pain Location: Shoulder Pain Orientation: Left Pain Descriptors / Indicators: Aching Pain Onset: On-going    Therapy/Group: Individual Therapy  Elray Mcgregor  Sheran Lawless, PT 04/17/2020, 4:57 PM

## 2020-04-18 ENCOUNTER — Encounter (HOSPITAL_COMMUNITY): Payer: Medicare Other | Admitting: Occupational Therapy

## 2020-04-18 ENCOUNTER — Inpatient Hospital Stay (HOSPITAL_COMMUNITY): Payer: Medicare Other

## 2020-04-18 ENCOUNTER — Inpatient Hospital Stay (HOSPITAL_COMMUNITY): Payer: Medicare Other | Admitting: Occupational Therapy

## 2020-04-18 ENCOUNTER — Ambulatory Visit (HOSPITAL_COMMUNITY): Payer: Medicare Other

## 2020-04-18 LAB — CBC WITH DIFFERENTIAL/PLATELET
Abs Immature Granulocytes: 0.02 10*3/uL (ref 0.00–0.07)
Basophils Absolute: 0.1 10*3/uL (ref 0.0–0.1)
Basophils Relative: 1 %
Eosinophils Absolute: 0.3 10*3/uL (ref 0.0–0.5)
Eosinophils Relative: 5 %
HCT: 43.5 % (ref 39.0–52.0)
Hemoglobin: 13.9 g/dL (ref 13.0–17.0)
Immature Granulocytes: 0 %
Lymphocytes Relative: 27 %
Lymphs Abs: 1.6 10*3/uL (ref 0.7–4.0)
MCH: 28.3 pg (ref 26.0–34.0)
MCHC: 32 g/dL (ref 30.0–36.0)
MCV: 88.4 fL (ref 80.0–100.0)
Monocytes Absolute: 0.7 10*3/uL (ref 0.1–1.0)
Monocytes Relative: 12 %
Neutro Abs: 3.3 10*3/uL (ref 1.7–7.7)
Neutrophils Relative %: 55 %
Platelets: 211 10*3/uL (ref 150–400)
RBC: 4.92 MIL/uL (ref 4.22–5.81)
RDW: 14.4 % (ref 11.5–15.5)
WBC: 6.1 10*3/uL (ref 4.0–10.5)
nRBC: 0 % (ref 0.0–0.2)

## 2020-04-18 LAB — BASIC METABOLIC PANEL
Anion gap: 9 (ref 5–15)
BUN: <5 mg/dL — ABNORMAL LOW (ref 8–23)
CO2: 27 mmol/L (ref 22–32)
Calcium: 9.5 mg/dL (ref 8.9–10.3)
Chloride: 100 mmol/L (ref 98–111)
Creatinine, Ser: 1.08 mg/dL (ref 0.61–1.24)
GFR, Estimated: >60 mL/min (ref >60)
Glucose, Bld: 120 mg/dL — ABNORMAL HIGH (ref 70–99)
Potassium: 4 mmol/L (ref 3.5–5.1)
Sodium: 136 mmol/L (ref 135–145)

## 2020-04-18 MED ORDER — ENOXAPARIN SODIUM 40 MG/0.4ML ~~LOC~~ SOLN: 40.0000 mg | SUBCUTANEOUS | Status: DC

## 2020-04-18 MED ORDER — APIXABAN 2.5 MG PO TABS
2.5000 mg | ORAL_TABLET | Freq: Two times a day (BID) | ORAL | Status: DC
  Administered 2020-04-18 – 2020-04-19 (×2): 2.5 mg via ORAL
  Filled 2020-04-18 (×2): qty 1

## 2020-04-18 MED ORDER — BACITRACIN ZINC 500 UNIT/GM EX OINT
TOPICAL_OINTMENT | CUTANEOUS | Status: AC
  Filled 2020-04-18: qty 28.35

## 2020-04-18 NOTE — Progress Notes (Signed)
Physical Therapy Discharge Summary  Patient Details  Name: Shannon [name] MRN: [id number] Date of Birth: 1945/12/21  Today's Date: 04/18/2020  Patient has met 0 of 10 long term goals due to UE/LE weakness and decreased postural control as well as patient limiting LOS on inpatient rehab.  Patient to discharge at an ambulatory level [place].   Patient's care partner is independent to provide the necessary physical assistance at discharge.  Reasons goals not met: Patient with decreased postural control and ongoing UE/LE weakness.   Recommendation:  Patient will benefit from ongoing skilled PT services in home health setting to continue to advance safe functional mobility, address ongoing impairments in strength, balance, coordination and safety, and minimize fall risk.  Equipment: rollator walker  Reasons for discharge: discharge from hospital  Patient/family agrees with progress made and goals achieved: Yes  PT Discharge Precautions/Restrictions Precautions Precautions: Cervical Required Braces or Orthoses: Cervical Brace Cervical Brace: Hard collar;Other (comment) (when sitting up and OOB) Restrictions Weight Bearing Restrictions: No Vital Signs Therapy Vitals Pulse Rate: 99 BP: 103/77 Pain Pain Assessment Pain Scale: 0-10 Pain Score: 0-No pain Vision/Perception  Perception Perception: Within Functional Limits Praxis Praxis: Intact  Cognition Overall Cognitive Status: Within Functional Limits for tasks assessed Arousal/Alertness: Awake/alert Orientation Level: Oriented X4 Attention: Selective Safety/Judgment: Impaired Sensation Sensation Light Touch: Impaired Detail Proprioception: Impaired Detail Coordination Gross Motor Movements are Fluid and Coordinated: No Fine Motor Movements are Fluid and Coordinated: No Coordination and Movement Description: impaired, limited BUE AROM with more movement distal > proximal Motor  Motor Motor: Abnormal postural  alignment and control;Tetraplegia;Abnormal tone Motor - Skilled Clinical Observations: severely limited mobility in BUE, more movement distal > proximal Motor - Discharge Observations: Rounded shoulders, forward head, arms hanging and low tone, Post pelvic tilt and hip flexion  Mobility Bed Mobility Bed Mobility: Rolling Left;Sit to Sidelying Left;Left Sidelying to Sit Rolling Left: Minimal Assistance - Patient > 75% Left Sidelying to Sit: Minimal Assistance - Patient >75% Supine to Sit: Supervision/Verbal cueing Sit to Sidelying Left: Minimal Assistance - Patient > 75% Transfers Transfers: Sit to Stand;Stand to Sit Sit to Stand: Minimal Assistance - Patient > 75% Stand to Sit: Contact Guard/Touching assist Stand Pivot Transfers: Minimal Assistance - Patient > 75% Stand Pivot Transfer Details: Verbal cues for technique;Verbal cues for sequencing;Verbal cues for precautions/safety;Tactile cues for posture Transfer (Assistive device): None Locomotion  Gait Ambulation: Yes Gait Assistance:Contact Guard/Touching assist Gait Distance (Feet): 200 Feet Assistive device: 4-wheeled walker Gait Assistance Details:  in home environment no device with min A with flexed posture, forwad head, anterior bias Gait Gait: Yes Gait Pattern: Impaired Gait Pattern: Step-through pattern;Decreased stride length;Decreased dorsiflexion - right;Decreased dorsiflexion - left;Decreased hip/knee flexion - left;Decreased hip/knee flexion - right;Trunk flexed;Lateral hip instability;Poor foot clearance - right;Poor foot clearance - left Stairs / Additional Locomotion Stairs: Yes Stairs Assistance: Minimal Assistance - Patient > 75% Stair Management Technique: Two rails;Step to pattern;Forwards Number of Stairs: 12 Ramp: Minimal Assistance - Patient >75% Wheelchair Mobility Wheelchair Mobility: No  Trunk/Postural Assessment  Cervical Assessment Cervical Assessment: Exceptions to [facility] (C-collar in  place) Thoracic Assessment Thoracic Assessment: Exceptions to [facility] (rounded shoulders in sitting/standing) Lumbar Assessment Lumbar Assessment: Exceptions to [facility] (poseterior pelvic tilt in sitting) Postural Control Postural Control: Within Functional Limits  Balance Balance Balance Assessed: Yes Standardized Balance Assessment Standardized Balance Assessment: Berg Balance Test Berg Balance Test Sit to Stand: Needs minimal aid to stand or to stabilize Standing Unsupported: Able to stand 2 minutes with supervision Sitting  with Back Unsupported but Feet Supported on Floor or Stool: Able to sit safely and securely 2 minutes Stand to Sit: Uses backs of legs against chair to control descent Transfers: Needs one person to assist Standing Unsupported with Eyes Closed: Able to stand 10 seconds with supervision Standing Ubsupported with Feet Together: Able to place feet together independently and stand for 1 minute with supervision From Standing, Reach Forward with Outstretched Arm: Reaches forward but needs supervision From Standing Position, Pick up Object from Floor: Unable to try/needs assist to keep balance From Standing Position, Turn to Look Behind Over each Shoulder: Needs supervision when turning Turn 360 Degrees: Needs assistance while turning Standing Unsupported, Alternately Place Feet on Step/Stool: Needs assistance to keep from falling or unable to try Standing Unsupported, One Foot in Front: Needs help to step but can hold 15 seconds Standing on One Leg: Unable to try or needs assist to prevent fall Total Score: 20 Static Sitting Balance Static Sitting - Balance Support: Feet supported Static Sitting - Level of Assistance: 6: Modified independent (Device/Increase time) Dynamic Sitting Balance Dynamic Sitting - Balance Support: Feet supported Dynamic Sitting - Level of Assistance: 5: Stand by assistance Dynamic Sitting - Balance Activities: Lateral lean/weight shifting;Forward  lean/weight shifting Sitting balance - Comments: dynamic sitting during seated ADL tasks Static Standing Balance Static Standing - Balance Support: No upper extremity supported Static Standing - Level of Assistance: 5: Stand by assistance Dynamic Standing Balance Dynamic Standing - Balance Support: During functional activity Dynamic Standing - Level of Assistance: 4: Min assist Dynamic Standing - Balance Activities: Forward lean/weight shifting;Lateral lean/weight shifting Extremity Assessment  RUE Assessment RUE Assessment: Exceptions to [facility] Active Range of Motion (AROM) Comments: limited mobility for years from RTC injury/ surgery - unable to functionally lift arm,  limited finger extension and functional use General Strength Comments: 4/5 grasp strength (only functional movement on R is grasp) LUE Assessment LUE Assessment: Exceptions to [facility] Passive Range of Motion (PROM) Comments: PROM shoulder flexion 90, elbow WFL, 1st digit does not flex Active Range of Motion (AROM) Comments: 10-20 AROM shoulder flexion, functional elbow flexion with distal assist, functional grip in L hand but limited by elbow/shoulder General Strength Comments: 4/ 5 grasp strength RLE Assessment RLE Assessment: Exceptions to [facility] Active Range of Motion (AROM) Comments: WFL RLE Strength Right Hip Flexion: 3+/5 Right Knee Flexion: 3/5 Right Knee Extension: 4/5 Right Ankle Dorsiflexion: 4+/5 LLE Assessment LLE Assessment: Exceptions to [facility] Active Range of Motion (AROM) Comments: WFL LLE Strength Left Hip Flexion: 4-/5 Left Knee Flexion: 4-/5 Left Knee Extension: 4+/5 Left Ankle Dorsiflexion: 4+/5    Reginia Naas  Magda Kiel, PT 04/18/2020, 11:31 AM

## 2020-04-18 NOTE — Progress Notes (Signed)
Physical Therapy Session Note  Patient Details  Name: Shannon [name] MRN: [id number] Date of Birth: 1945-12-10  Today's Date: 04/18/2020 PT Individual Time: 1030-1130 PT Individual Time Calculation (min): 60 min   Short Term Goals: Week 1:  PT Short Term Goal 1 (Week 1): Pt will perform supine<>sit with CGA PT Short Term Goal 2 (Week 1): Pt will perform sit<>stands with CGA PT Short Term Goal 3 (Week 1): Pt will perforrm bed<>chair transfers with CGA PT Short Term Goal 4 (Week 1): Pt will ambulate at least 124ft using LRAD with CGA PT Short Term Goal 5 (Week 1): Pt will ascend/descend 8 steps using R HR with CGA  Skilled Therapeutic Interventions/Progress Updates: Patient seated at bedside with daughter present.  Discussed caregiver education items PT wanted to review and asked daughter if there were other items she felt she needed to revisit.  Patient performed sit to stand and stand pivot to bed with CGA.  Sit to sidelying with cues and min A for 1 LE assist onto bed.  Educated daughter in technique for improved patient independence and safety.  Rolling L with min A to place pt's hand on his trunk and then S, side to sit with min A for trunk support.  Patient sit to stand with daughter assist and ambulated into general gym with rollator x 150.  Seated for rest prior to review of stairs with daughter.  Patient negotiated 4 steps with R railing both hands to rail with daughter assist and cues for feet apart with improved foot positioning.  Daughter excused to continue preparations for d/c.  Patient negotiated 12 steps with bilateral rails and min A.  Patient ambulated to ortho gym with rollator and CGA and negotiated ramp with min A.  Patient performed standing balance tasks reaching to pick up item from floor min A, standing turning to look over shoulder CGA, turning in circle min A and static standing 2 minutes with S.  Patient c/o fatigue when attempting step taps to 6" step.  Ambulated to room x  120' with CGA with rollator.  Left up in chair with belt alarm active and needs in reach.       Therapy Documentation Precautions:  Precautions Precautions: Cervical Precaution Booklet Issued: Yes (comment) Required Braces or Orthoses: Cervical Brace Cervical Brace: Hard collar, Other (comment) (when sitting up and OOB) Restrictions Weight Bearing Restrictions: No  Pain: Pain Assessment Pain Score: 0-No pain    Therapy/Group: Individual Therapy  Elray Mcgregor  [name], [place] 04/18/2020, 5:24 PM

## 2020-04-18 NOTE — Progress Notes (Signed)
Inpatient Rehabilitation Care Coordinator  Discharge Note  The overall goal for the admission was met for:   Discharge location: [place] TO RETURN BACK TO WORK  Length of Stay: Yes-12 DAYS  Discharge activity level: Yes-MIN-MOD-ADL'S AND SUPERVISION-CUES-PT  Home/community participation: Yes  Services provided included: MD, RD, PT, OT, RN, CM, TR, Pharmacy, Neuropsych and SW  Financial Services: Medicare and Private Insurance: [employer]  Follow-up services arranged: Home Health: [facility]-PT,OT, AIDE and Patient/Family has no preference for HH/DME agencies  Comments (or additional information):DAUGHTER WAS HERE FOR TWO DAYS PRIOR TO DISCHARGE AND DID HANDS ON CARE AND BOTH COMFORTABLE WITH HIS CARE. PRIVATE DUTY LIST GIVEN TO DAUGHTER TO FOLLOW UP WITH ONCE NEEDS TO GO BACK TO WORK  Patient/Family verbalized understanding of follow-up arrangements: Yes  Individual responsible for coordination of the follow-up plan: SELF [phone] OR [name]-DAUGHTER [phone]  Confirmed correct DME delivered: Elease Hashimoto 04/18/2020    Elease Hashimoto

## 2020-04-18 NOTE — Progress Notes (Signed)
Physical Therapy Session Note  Patient Details  Name: Shannon [name] MRN: [id number] Date of Birth: 1946-02-23  Today's Date: 04/18/2020 PT Individual Time: 1349-1414 PT Individual Time Calculation (min): 25 min   Short Term Goals: Week 1:  PT Short Term Goal 1 (Week 1): Pt will perform supine<>sit with CGA PT Short Term Goal 2 (Week 1): Pt will perform sit<>stands with CGA PT Short Term Goal 3 (Week 1): Pt will perforrm bed<>chair transfers with CGA PT Short Term Goal 4 (Week 1): Pt will ambulate at least 159ft using LRAD with CGA PT Short Term Goal 5 (Week 1): Pt will ascend/descend 8 steps using R HR with CGA  Skilled Therapeutic Interventions/Progress Updates:     Pt received seated in hard backed chair and agrees to therapy. No complaint of pain. Stand pivot transfer to [facility] with close supervision. WC transport to therapy gym for time management. Pt performs multiple bouts of ambulation with PT providing CGA. Sit to stand with CGA and pt ambulates 40' x5 with seated rest breaks. PT provides cuing for spontaneous stopping to challenge pt's dynamic balance, and pt also performs backward ambulation for dynamic gait challenge. Pt performs stand pivot transfer back to chair with CGA. Left seated with alarm intact and all needs within reach.  Therapy Documentation Precautions:  Precautions Precautions: Cervical Precaution Booklet Issued: Yes (comment) Required Braces or Orthoses: Cervical Brace Cervical Brace: Hard collar, Other (comment) (when sitting up and OOB) Restrictions Weight Bearing Restrictions: No  Therapy/Group: Individual Therapy  Beau Fanny, PT, DPT 04/18/2020, 4:16 PM

## 2020-04-18 NOTE — Plan of Care (Signed)
Goals unmet, see below.  Problem: RH Balance Goal: LTG Patient will maintain dynamic sitting balance (PT) Description: LTG:  Patient will maintain dynamic sitting balance with assistance during mobility activities (PT) 04/18/2020 1659 by Max Sane, PT Outcome: Not Met (add Reason) Note: UE weakness, decreased postural control   Problem: RH Balance Goal: LTG Patient will maintain dynamic standing balance (PT) Description: LTG:  Patient will maintain dynamic standing balance with assistance during mobility activities (PT) 04/18/2020 1659 by Max Sane, PT Outcome: Not Met (add Reason) Note: CGA for safety due to UE/trunk weakness  Problem: Sit to Stand Goal: LTG:  Patient will perform sit to stand with assistance level (PT) Description: LTG:  Patient will perform sit to stand with assistance level (PT) Outcome: Not Met (add Reason) Note: Decreased postural control, needs CGA to min A   Problem: RH Bed Mobility Goal: LTG Patient will perform bed mobility with assist (PT) Description: LTG: Patient will perform bed mobility with assistance, with/without cues (PT). Outcome: Not Met (add Reason) Note: Min A needed due to trunk weakness   Problem: RH Bed to Chair Transfers Goal: LTG Patient will perform bed/chair transfers w/assist (PT) Description: LTG: Patient will perform bed to chair transfers with assistance (PT). Outcome: Not Met (add Reason) Note: Needs min A for safety due to decreased postural control   Problem: RH Car Transfers Goal: LTG Patient will perform car transfers with assist (PT) Description: LTG: Patient will perform car transfers with assistance (PT). Outcome: Not Met (add Reason) Note: Min A needed due to weakness   Problem: RH Furniture Transfers Goal: LTG Patient will perform furniture transfers w/assist (OT/PT) Description: LTG: Patient will perform furniture transfers  with assistance (OT/PT). Outcome: Not Met (add Reason) Note: Inconsistent, at  times needs min A   Problem: RH Ambulation Goal: LTG Patient will ambulate in controlled environment (PT) Description: LTG: Patient will ambulate in a controlled environment, # of feet with assistance (PT). Outcome: Not Met (add Reason) Note: Needs min A for safety due to decreased postural control   Problem: RH Ambulation Goal: LTG Patient will ambulate in home environment (PT) Description: LTG: Patient will ambulate in home environment, # of feet with assistance (PT). Outcome: Not Applicable Note: No assistance goal set   Problem: RH Stairs Goal: LTG Patient will ambulate up and down stairs w/assist (PT) Description: LTG: Patient will ambulate up and down # of stairs with assistance (PT) Outcome: Not Met (add Reason) Note: Min A for safety due to decreased postural control and LE weakness  Magda Kiel, PT 04/18/2020

## 2020-04-18 NOTE — Patient Care Conference (Signed)
Inpatient RehabilitationTeam Conference and Plan of Care Update Date: 04/18/2020   Time: 11:35 AM    Patient Name: Shannon [name]      Medical Record Number: [id number]  Date of Birth: 1945-12-16 Sex: Male         Room/Bed: 4M01C/4M01C-01 Payor Info: Payor: MEDICARE / Plan: MEDICARE PART A AND B / Product Type: *No Product type* /    Admit Date/Time:  04/07/2020  4:57 PM  Primary Diagnosis:  Cervical myelopathy [facility])  Hospital Problems: Principal Problem:   Cervical myelopathy (HCC) Active Problems:   Quadriplegia and quadriparesis (HCC)   Acute blood loss anemia   Slow transit constipation   Essential hypertension    Expected Discharge Date: Expected Discharge Date: 04/19/20  Team Members Present: Physician leading conference: Dr. Genice Rouge Care Coodinator Present: Dossie Der, LCSW;Kaevion Sinclair Marlyne Beards, RN, BSN, CRRN Nurse Present: Despina Hidden, RN PT Present: Sheran Lawless, PT OT Present: Earleen Newport, OT PPS Coordinator present : Edson Snowball, PT     Current Status/Progress Goal Weekly Team Focus  Bowel/Bladder   pt continent of b and b lbm-04/16/20  remain cont of b and b  Assess q shift and prn   Swallow/Nutrition/ Hydration             ADL's   Max for eating with MAS, total without. Mod UB bathe/Max LB bathe, Max UB/LB dressing, Min toilet transfers  Mod-Max bathe/dress/toilet/eating, goals downgraded  adaptive techniques, family ed with daughter, BUE NMR, ADL transfers/retraining   Mobility   min A with rollator vs no device, CGA transfers, min A stairs single rail, min to mod A bed mobility  S level ambulation, stairs and transfers  caregiver education   Communication             Safety/Cognition/ Behavioral Observations            Pain   pt has no current complaints of pain  remain pain free  Assess q shift and prn   Skin   pt has incion antier part on neck with steri strips  remain free of skin breakdown and infection  assess q shift and prn      Discharge Planning:  On-going family education with daughter yesterday and today. Both still somewhat unrealistic regarding his progress and recovery. Have private duty list to hire assist once daughter goes back to work   Team Discussion: Moderate pain but refuses pain medication, continent B/B, incision looks good. OT reports patient has not made much progress with arms, daughter will care for him. PT reports patient will not meet his goals, using rollator for community up to 200' but fatigues. MD reports patient does not want any chemicals in his body so he will not take any unnecessary medications. SW reports the patient does not want to give out his SS# to insurance so he will buy any equipment needed on his own.  Patient on target to meet rehab goals: no, but he will have the assistance of his daughter at discharge.  *See Care Plan and progress notes for long and short-term goals.   Revisions to Treatment Plan:  None at this time.  Teaching Needs: Family education complete.  Current Barriers to Discharge: Home enviroment access/layout, Wound care, Weight and Medication compliance  Possible Resolutions to Barriers: Educate daughter on wound care and dressing changes, educate daughter on medication management and compliance.     Medical Summary Current Status: doesn't want "chemicals" in body- no meds if possible- pain 2-3/10- refuses  all meds; continent- taking colace, not laxative; incision good- no skin issues  Barriers to Discharge: Home enviroment access/layout;Wound care;Weight;Medication compliance  Barriers to Discharge Comments: Mod A UB (due to UE weakness); Max A LB; HHA for gait; Possible Resolutions to [employer] Focus: see if can get pt to take Lovenox?- min A-CGA- PT- using rollator-212ft; refuses all meds except colace and BP meds- d/c tomorrow   Continued Need for Acute Rehabilitation Level of Care: The patient requires daily medical management by a  physician with specialized training in physical medicine and rehabilitation for the following reasons: Direction of a multidisciplinary physical rehabilitation program to maximize functional independence : Yes Medical management of patient stability for increased activity during participation in an intensive rehabilitation regime.: Yes Analysis of laboratory values and/or radiology reports with any subsequent need for medication adjustment and/or medical intervention. : Yes   I attest that I was present, lead the team conference, and concur with the assessment and plan of the team.   Tennis Must 04/18/2020, 5:38 PM

## 2020-04-18 NOTE — Progress Notes (Signed)
Occupational Therapy Discharge Summary  Patient Details  Name: Shannon [name] MRN: [id number] Date of Birth: 18-May-1946    Patient has met 9 of 12 long term goals due to improved activity tolerance, improved balance, postural control, ability to compensate for deficits, functional use of  LEFT upper extremity and improved awareness.  Patient to discharge at [facility] Max Assist level.  Patient's care partner is independent to provide the necessary physical assistance at discharge.  The pt is at an overall mod - max assist level with ADL tasks including grooming, dressing, bathing, toileting, and self feeding d/t severe functional limitations in his BUEs with L having more function than R. The pt has been educated on numerous adaptive and compensatory techniques to maximize his independence and decrease caregiver burden for his daughter who will be his caregiver, family ed completed 10/25-10/26. However, pt is able to perform functional mobility and transfers with Min - CGA with and without rollator.   Reasons goals not met: Pt is Max A for self feeding, CGA for furniture transfers, and Max A for grooming d/t lack of functional use in [place].   Recommendation:  Patient will benefit from ongoing skilled OT services in home health setting to continue to advance functional skills in the area of BADL and Reduce care partner burden.  Equipment: No equipment provided Pt already purchased TTB.   Reasons for discharge: discharge from hospital and pt is at max potential d/t functional limitations in BUEs - family/caregiver education completed  Patient/family agrees with progress made and goals achieved: Yes  OT Discharge Precautions/Restrictions  Precautions Precautions: Cervical Required Braces or Orthoses: Cervical Brace Cervical Brace: Hard collar;Other (comment) (when sitting up and OOB) Restrictions Weight Bearing Restrictions: No Vital Signs Therapy Vitals Pulse Rate: 99 BP:  103/77 Pain Pain Assessment Pain Scale: 0-10 Pain Score: 0-No pain ADL ADL Eating: Maximal assistance Grooming: Dependent Upper Body Bathing: Moderate assistance Where Assessed-Upper Body Bathing: Edge of bed Lower Body Bathing: Moderate assistance Where Assessed-Lower Body Bathing: Edge of bed Upper Body Dressing: Moderate assistance Where Assessed-Upper Body Dressing: Edge of bed Lower Body Dressing: Maximal assistance Where Assessed-Lower Body Dressing: Edge of bed Toileting: Maximal assistance Where Assessed-Toileting: Glass blower/designer: Psychiatric nurse Method: Counselling psychologist: Raised toilet seat Tub/Shower Transfer: Minimal assistance Vision Baseline Vision/History: Wears glasses Wears Glasses: Reading only Patient Visual Report: No change from baseline Perception  Perception: Within Functional Limits Praxis Praxis: Intact Cognition Overall Cognitive Status: Within Functional Limits for tasks assessed Arousal/Alertness: Awake/alert Orientation Level: Oriented X4 Attention: Selective Safety/Judgment: Impaired Sensation Sensation Light Touch: Impaired Detail Proprioception: Impaired Detail Coordination Gross Motor Movements are Fluid and Coordinated: No Fine Motor Movements are Fluid and Coordinated: No Coordination and Movement Description: impaired, limited BUE AROM with more movement distal > proximal Motor  Motor Motor: Abnormal postural alignment and control;Tetraplegia;Abnormal tone Motor - Skilled Clinical Observations: severely limited mobility in BUE, more movement distal > proximal Motor - Discharge Observations: Rounded shoulders, forward head, arms hanging and low tone, Post pelvic tilt and hip flexion Mobility  Bed Mobility Bed Mobility: Rolling Left;Sit to Sidelying Left;Left Sidelying to Sit Rolling Left: Minimal Assistance - Patient > 75% Left Sidelying to Sit: Minimal Assistance - Patient  >75% Supine to Sit: Supervision/Verbal cueing Sit to Sidelying Left: Minimal Assistance - Patient > 75% Transfers Sit to Stand: Minimal Assistance - Patient > 75% Stand to Sit: Contact Guard/Touching assist  Trunk/Postural Assessment  Cervical Assessment Cervical Assessment: Exceptions to [facility] (C-collar in place)  Thoracic Assessment Thoracic Assessment: Exceptions to [facility] (rounded shoulders in sitting/standing) Lumbar Assessment Lumbar Assessment: Exceptions to [facility] (poseterior pelvic tilt in sitting) Postural Control Postural Control: Within Functional Limits  Balance Balance Balance Assessed: Yes Static Sitting Balance Static Sitting - Balance Support: Feet supported Static Sitting - Level of Assistance: 6: Modified independent (Device/Increase time) Dynamic Sitting Balance Dynamic Sitting - Balance Support: Feet supported Dynamic Sitting - Level of Assistance: 5: Stand by assistance Dynamic Sitting - Balance Activities: Lateral lean/weight shifting;Forward lean/weight shifting Sitting balance - Comments: dynamic sitting during seated ADL tasks Static Standing Balance Static Standing - Balance Support: No upper extremity supported Static Standing - Level of Assistance: 5: Stand by assistance Dynamic Standing Balance Dynamic Standing - Balance Support: During functional activity Dynamic Standing - Level of Assistance: 4: Min assist Dynamic Standing - Balance Activities: Forward lean/weight shifting;Lateral lean/weight shifting Extremity/Trunk Assessment RUE Assessment RUE Assessment: Exceptions to [facility] Active Range of Motion (AROM) Comments: limited mobility for years from RTC injury/ surgery - unable to functionally lift arm,  limited finger extension and functional use General Strength Comments: 4/5 grasp strength (only functional movement on R is grasp) LUE Assessment LUE Assessment: Exceptions to [facility] Passive Range of Motion (PROM) Comments: PROM shoulder flexion 90, elbow  WFL, 1st digit does not flex Active Range of Motion (AROM) Comments: 10-20 AROM shoulder flexion, functional elbow flexion with distal assist, functional grip in L hand but limited by elbow/shoulder General Strength Comments: 4/ 5 grasp strength   Viona Gilmore 04/18/2020, 11:26 AM

## 2020-04-18 NOTE — Discharge Summary (Signed)
Physician Discharge Summary  Patient ID: Shannon [name] MRN: [id number] DOB/AGE: 1946-04-08 74 y.o.  Admit date: 04/07/2020 Discharge date: 04/19/2020  Discharge Diagnoses:  Principal Problem:   Cervical myelopathy (HCC) Active Problems:   Quadriplegia and quadriparesis (HCC)   Acute blood loss anemia   Slow transit constipation   Essential hypertension Pain management Asthma  Discharged Condition: Stable  Significant Diagnostic Studies: DG Cervical Spine 2-3 Views  Result Date: 04/05/2020 CLINICAL DATA:  Intraoperative localization images of the cervical spine EXAM: CERVICAL SPINE - 2-3 VIEW COMPARISON:  MRI 03/08/2020 FINDINGS: Three sequential cross-table lateral images of the cervical spine were obtained. 13:14 Radiograph: Demonstrates a pointed surgical implement at the anterior disc space C3-4 with 3 tractor anteriorly. Intubation. Electrode grounding lead projecting over the head. 14:41 Radiograph: Pointed surgical implement positioned at the C4-5 disc space. Surgical retractor anterior to these levels with screws positioned in the anterior cortices of the C4 and 5 vertebral bodies. Surgical material seen within the intervertebral disc space C3-4 as well. 15:58 Radiograph: Interval placement of interbody spacers at C3-4, C4-5 with anterior plate and screw fixation construct with bilateral securing screws at the C3-C5 vertebral bodies anteriorly. Anterior laparotomy sponge markers present. Intubated. Grounding lead remains. IMPRESSION: 1. Intraoperative localization and subsequent anterior cervical discectomy and fusion at C3-5 as described above. 2. Final image depicts at least 2 small laparotomy sponge markers in the anterior soft tissue. Electronically Signed   By: Kreg Shropshire M.D.   On: 04/05/2020 18:00    Labs:  Basic Metabolic Panel: Recent Labs  Lab 04/18/20 1001  NA 136  K 4.0  CL 100  CO2 27  GLUCOSE 120*  BUN <5*  CREATININE 1.08  CALCIUM 9.5    CBC:  Recent Labs  Lab 04/18/20 1001  WBC 6.1  NEUTROABS 3.3  HGB 13.9  HCT 43.5  MCV 88.4  PLT 211    CBG: No results for input(s): GLUCAP in the last 168 hours.  Family history.  Mother with myocardial infarction as well as CAD and hypertension.  Father with diabetes and hypertension.  Denies any colon cancer esophageal cancer or rectal cancer  Brief HPI:   Shannon [name] is a 74 y.o. right-handed male with history of hypertension asthma as well as Chiari malformation,ssyringomyelia.  Patient lives alone 1 level home 5 steps to entry.  Ambulating without assistive device.  Presented 04/05/2020 with complaints of new left arm pain numbness as well as chronic arm pain on the right with numbness tingling weakness as well as unsteady gait difficulty using his hands with decreased coordination.  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  Underwent C3-4 4-5 anterior cervical discectomy decompression interbody arthrodesis 04/05/2020 per Dr. Tressie Stalker.  Cervical brace as directed.  Tolerating a regular diet.  Therapy evaluations completed and patient was admitted for a comprehensive rehab program.   Hospital Course: Shannon [name] was admitted to rehab 04/07/2020 for inpatient therapies to consist of PT, ST and OT at least three hours five days a week. Past admission physiatrist, therapy team and rehab RN have worked together to provide customized collaborative inpatient rehab.  Pertaining to patient's cervical radiculopathy with myelopathy as well as Chiari malformation status post C3-4 4-5 anterior cervical discectomy and decompression 04/05/2020.  Surgical site healing nicely patient would follow-up with neurosurgery.  Cervical collar as directed.  Pain management initially on Flexeril and oxycodone has been  discontinued.  Monitor for any neuropathic pain.   SCDs for DVT prophylaxis as well as the addition of Eliquis.  Blood pressure controlled with Norvasc.  Bouts of constipation maintained on MiraLAX.   Blood pressures were monitored on TID basis and controlled     Rehab course: During patient's stay in rehab weekly team conferences were held to monitor patient's progress, set goals and discuss barriers to discharge. At admission, patient required minimal assist 200 feet rolling walker minimal assist sit to stand.  Max assist lower body dressing max assist grooming minimal guard toilet transfers  Physical exam.  Blood pressure 116/68 pulse 79 temperature 98.1 respirations 18 oxygen saturation 90% room air Constitutional.  No acute distress HEENT Head.  Normocephalic and atraumatic Neck.  Cervical collar in place Eyes.  Pupils round and reactive to light no discharge.nystagmus Cardiac regular rate rhythm without any extra sounds or murmur heard Abdomen.  Soft nontender positive bowel sounds without rebound Respiratory effort normal no respiratory distress without wheeze  Neurologic.  Alert oriented follows commands Motor.  Right upper extremity shoulder abduction 2/5 elbow flexion extension 3+/5 handgrip 4+/5 Left upper extremity shoulder abduction 3/5 elbow flexion extension 3 -/5 handgrip 3/5 About lower extremities 4 - 4+/5 proximal distal   He/  has had improvement in activity tolerance, balance, postural control as well as ability to compensate for deficits. He/ has had improvement in functional use RUE/LUE  and RLE/LLE as well as improvement in awareness.  Stand pivot transfer to wheelchair with close supervision.  Perform multiple bouts of ambulation with PT providing contact-guard assist.  Perform stand pivot transfers back to chair with contact-guard assist.  Max assist level for ADLs with full family teaching completed to assist with ADLs.  The patient is an overall mod max assist level with ADL task including grooming dressing  bathing toileting and self-feeding.  Full family teaching completed plan discharge to home       Disposition: Discharged to home    Diet: Regular  Special Instructions: No driving smoking or alcohol  Cervical collar as directed  Medications at discharge 1.  Norvasc 10 mg p.o. daily 2.  Eliquis 2.5 mg p.o. twice daily 3.  Colace 100 mg p.o. twice daily 4.  Protonix 40 mg p.o. nightly 5.  MiraLAX daily as needed mild constipation  30-35 minutes were spent completing discharge summary discharge planning  Discharge Instructions    Ambulatory referral to Physical Medicine Rehab   Complete by: As directed    Moderate complexity follow-up 1 to 2 weeks cervical myelopathy       Follow-up Information    Lovorn, Aundra Millet, MD Follow up.   Specialty: Physical Medicine and Rehabilitation Why: Office to call for appointment Contact information: [address] [place] [address] [phone]        Tressie Stalker, MD Follow up.   Specialty: Neurosurgery Why: Call for appointment Contact information: [address] [place] [address] [phone]        [name], MD. Call.   Specialty: Family Medicine Why: for post hospital follow up Contact information: [address] [place] [address] [phone]               Signed: Charlton Amor 04/18/2020, 5:20 PM

## 2020-04-18 NOTE — Progress Notes (Signed)
Occupational Therapy Session Note  Patient Details  Name: Shannon [name] MRN: [id number] Date of Birth: February 08, 1946  Today's Date: 04/18/2020 OT Individual Time: 3557-3220  And 2542-7062 OT Individual Time Calculation (min): 75 min and 39 min   Short Term Goals: Week 1:  OT Short Term Goal 1 (Week 1): STGs = LTGs OT Short Term Goal 1 - Progress (Week 1): Progressing toward goal Week 2:  OT Short Term Goal 1 (Week 2): STGs = LTGs d/t ELOS   Skilled Therapeutic Interventions/Progress Updates:    Pt greeted at time of session sitting up in chair with no brace, agreeable to OT session, no c/o pain, daughter Shannon [name] present for family ed. Daughter donned brace with supervision, good carryover from previous session. Pt ambulated with Min A with daughter to sink level, daughter assisted with doffing clothing Max-total A managing around brace. UB bathing Mod A with family ed for daughter for compensatory techniques, stabilizing UEs on surfaces to decrease caregiver burden, and technique for wrapping washcloth around his hand for the pt to wash as much as possible including periarea in standing. LB bathing Max A for BLEs past knee level, pt able to wash thighs and periarea with technique, assist for washing buttocks performed by daughter. Oral hygiene Min-Mod after set up, propping LUE on surface and placement of toothepaste, pt able to brush teeth. UB dress via daughter with Max A d/t circle neck shirt  And not V-neck as the pt did better with this in previous sessions. Also recommended button up shirts if able, daughter to assist with buttons. LB dress Max to thread, pt able to assist minimally with pulling up over hips. Ambulated with rollator to ADL apartment, TTB transfer Min A with daughter assisting. Ambulated back to room Min/CGA with rollator, set up in chair with alarm on call bell in reach.   Session 2: Pt greeted at time of session sitting up in chair agreeable to OT session, no c/o pain.  Ambulated to/from room <> gym with Min A for power up and Min-CGA for ambulation with rollator occassionally pt would need cues for feet clearance. Stand pivot to mat Min guard. NMR for BUEs with weight bearing through large therapy ball in sets of 20, forward weight shift/lean into therapy ball with BUE stretch with brief hold at end range in sets of 15. Discussion with the pt regarding DC home, use of rollator for community mobility, and having daughter assist with ADLs, pt verbalized understanding saying "you do what you have to do." After ambulating back to room with rollator min guard, stand pivot chair > bed CGA without AD, sit to supine Min A for BLE management, collar removed, alarm on and call bell in reach.   Therapy Documentation Precautions:  Precautions Precautions: Cervical Precaution Booklet Issued: Yes (comment) Required Braces or Orthoses: Cervical Brace Cervical Brace: Hard collar, Other (comment) (when up OOB) Restrictions Weight Bearing Restrictions: No    Therapy/Group: Individual Therapy  Erasmo Score 04/18/2020, 11:07 AM

## 2020-04-18 NOTE — Progress Notes (Addendum)
Patient ID: Shannon [name], male   DOB: [birth year], 74 y.o.   MRN: [id number] Daughter here and family education going well, daughter is getting in there and assisting with tolieting and bathing. Issue with obtaining rollator due to Medicare number is either wrong or birthday wrong. Pt has his medicare card here and was not willing to give [employer] rep is SSN. He voiced will order on [place] if feels needed due to feel will be using outside of the home and not inside. Daughter was present the whole time and in agreement with Dad. If he changes his mind will pursue rollator. Both feel comfortable with discharge tomorrow.  12:57 PM Met with pt to confirm after team conference all feel ready for discharge tomorrow. He is ready but wished he had more movement in his arms but feels it is what it is. He will focus on the positive. He can see small progress in his hands and will continue to hope for the best.  2:13 pm MD reports spoke with pt and he told her his daughter ws not going to be staying with him at discharge. Have left message for daughter regarding this issue. She did do well with education today.

## 2020-04-18 NOTE — Progress Notes (Signed)
[place] PHYSICAL MEDICINE & REHABILITATION PROGRESS NOTE   Subjective/Complaints:  Pt sitting in bedside chair.  Reports he's gassy after eating his meals, however doesn't want any medicine, even Gas-x. Doesn't want chemical sin body.   Discussed using big bore straws or less gulping or less carbonation as the only other treatments.    ROS:   Pt denies SOB, abd pain, CP, N/V/C/D, and vision changes   Objective:   No results found. No results for input(s): WBC, HGB, HCT, PLT in the last 72 hours. No results for input(s): NA, K, CL, CO2, GLUCOSE, BUN, CREATININE, CALCIUM in the last 72 hours.  Intake/Output Summary (Last 24 hours) at 04/18/2020 0859 Last data filed at 04/17/2020 2024 Gross per 24 hour  Intake 240 ml  Output --  Net 240 ml       Physical Exam: Vital Signs Blood pressure 103/77, pulse 99, temperature 99.5 F (37.5 C), temperature source Oral, resp. rate 17, height 5\' 10"  (1.778 m), weight 91 kg, SpO2 93 %. Constitutional: sitting in bedside chair, appropriate, NAD- done with breakfast HENT: Normocephalic.  Atraumatic. Eyes: EOMI. No discharge. Cardiovascular: RRR Respiratory: CTA B/L- no W/R/R- good air movement GI: Soft, NT, ND, (+)BS  Skin: Warm and dry.  Intact. Psych: appropriate Musc: No edema in extremities.  No tenderness in extremities. Neurologic: Alert RUE: Shoulder abduction 2/5, elbow flexion/extension 3+/5, handgrip 4+/5, no change on exam this AM LUE: Shoulder abduction 3/5, elbow flexion/extension 3 -/5, handgrip 3/5, no change this AM Bilateral lower extremity: Hip flexion, knee extension 4/5, ankle dorsiflexion 4 -/5   Assessment/Plan: 1. 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  Physiatrist is providing close team supervision and 24 hour management of active medical problems listed below.  Physiatrist and rehab  team continue to assess barriers to discharge/monitor patient progress toward functional and medical goals  Care Tool:  Bathing    Body parts bathed by patient: Chest, Abdomen, Front perineal area, Face, Right upper leg, Left upper leg   Body parts bathed by helper: Right lower leg, Left lower leg, Buttocks, Right arm, Left arm     Bathing assist Assist Level: Moderate Assistance - Patient 50 - 74%     Upper Body Dressing/Undressing Upper body dressing   What is the patient wearing?: Pull over shirt, Orthosis Orthosis activity level: Performed by helper  Upper body assist Assist Level: Maximal Assistance - Patient 25 - 49%    Lower Body Dressing/Undressing Lower body dressing      What is the patient wearing?: Underwear/pull up, Pants     Lower body assist Assist for lower body dressing: Maximal Assistance - Patient 25 - 49%     Toileting Toileting    Toileting assist Assist for toileting: Maximal Assistance - Patient 25 - 49%     Transfers Chair/bed transfer  Transfers assist     Chair/bed transfer assist level: Minimal Assistance - Patient > 75%     Locomotion Ambulation   Ambulation assist      Assist level: Contact Guard/Touching assist Assistive device: Walker-rolling (4 wheel walker/rollator) Max distance: 150   Walk 10 feet activity   Assist     Assist level: Contact Guard/Touching assist Assistive device: Walker-rolling (rollator)   Walk 50 feet activity   Assist    Assist level: Contact Guard/Touching assist Assistive device: Walker-rolling (rollator)    Walk 150 feet activity   Assist  Assist level: Contact Guard/Touching assist Assistive device: Walker-rolling (rollator)    Walk 10 feet on uneven surface  activity   Assist     Assist level: Minimal Assistance - Patient > 75% Assistive device: Walker-rolling (rollator)   Wheelchair     Assist Will patient use wheelchair at discharge?: No              Wheelchair 50 feet with 2 turns activity    Assist            Wheelchair 150 feet activity     Assist          Medical Problem List and Plan: 1.  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  Antithrombotics: -DVT/anticoagulation:  SCDs 10/26- will see if [facility] will allow [place] to start DVT prophylaxis- pt doesn't like meds so might refuse, but don't want pt to get DVT/PE.              -antiplatelet therapy: N/A  3. Pain Management: Flexeril and oxycodone DC'd             Monitor for post-op and neuropathic pain with increased exertion. Has some chronic right shoulder pain- prefers no pain medications.   10/25- controlled with no meds- con't regimen 4. Mood: Provide emotional support             -antipsychotic agents: N/A 5. Neuropsych: This patient is capable of making decisions on his own behalf. 6. Skin/Wound Care: Routine skin checks 7. Fluids/Electrolytes/Nutrition: Routine in and outs.  8.  Hypertension.  Norvasc 10 mg daily.                Vitals:   04/18/20 0450 04/18/20 0821  BP: 123/68 103/77  Pulse: 96 99  Resp: 17   Temp: 99.5 F (37.5 C)   SpO2: 93%    10/26- BP controlled- con't regimen 9. Asthma             Monitor respiratory states with activity  Controlled on 10/24 10.  Safety- pt without confusion, is not aware of current limitations, should improve with PT, OT  Will use telesitter until pt using call bell consistently, now has "pancake" 11. Constipation: started daily Miralax, does not want the medication adjustment  Improving  10/26- made Miralax prn- pt refusing "chemicals in body".   12.  Mild acute blood loss anemia:   Hemoglobin 12.9 on 10/18 13. Disposition: lives alone. Daughter will be staying with him a little when he leaves here. She called on 10/20 to discuss plan with her  father. She is coming for caregiver training on Monday and Tuesday.      LOS: 11 days A FACE TO FACE EVALUATION WAS PERFORMED  Madeline Bebout 04/18/2020, 8:59 AM

## 2020-04-19 ENCOUNTER — Telehealth: Payer: Self-pay | Admitting: Physical Medicine and Rehabilitation

## 2020-04-19 MED ORDER — PANTOPRAZOLE SODIUM 40 MG PO TBEC: 40.0000 mg | DELAYED_RELEASE_TABLET | Freq: Every day | ORAL | Status: DC

## 2020-04-19 MED ORDER — DOCUSATE SODIUM 100 MG PO CAPS: 100.0000 mg | ORAL_CAPSULE | Freq: Two times a day (BID) | ORAL | 0 refills | Status: DC

## 2020-04-19 MED ORDER — APIXABAN 2.5 MG PO TABS: 2.5000 mg | ORAL_TABLET | Freq: Two times a day (BID) | ORAL | Status: DC

## 2020-04-19 NOTE — Progress Notes (Signed)
Patient d/c home with wife. A&O x4. Denied pain or discomfort at the time of d/c. All personal belongings were taken home by wife. Patient transported to the lobby by nursing staff. All questions and concerns addressed.

## 2020-04-19 NOTE — Progress Notes (Signed)
Patient and family left without d/c papers. This RN contacted daughter and she agreed to come and pick the d/c papers up. This RN have called the daughter twice  to come and pick the d/c papers but she is not answering her phone voice messages were left with both phone calls.

## 2020-04-19 NOTE — Telephone Encounter (Signed)
Patient and family left without D/c papers or Rx. Daughter contacted by RN and recommended sending Rx to [facility] and she will be by to pick up discharge papers

## 2020-04-19 NOTE — Discharge Instructions (Signed)
Inpatient Rehab Discharge Instructions  TRINDON DORTON Discharge date and time:  04/19/20  Activities/Precautions/ Functional Status: Activity: Cervical collar as directed Diet: regular diet Wound Care: Routine skin checks   Functional status:  ___ No restrictions     ___ Walk up steps independently _X__ 24/7 supervision/assistance   ___ Walk up steps with assistance ___ Intermittent supervision/assistance  ___ Bathe/dress independently ___ Walk with walker     _x__ Bathe/dress with assistance ___ Walk Independently    ___ Shower independently ___ Walk with assistance    ___ Shower with assistance _X__ No alcohol     ___ Return to work/school ________   Special Instructions: 1. No driving, smoking or alcohol use. 2. Hard collar has to be on when at edge of bed or out of bed.    COMMUNITY REFERRALS UPON DISCHARGE:    Home Health:   PT, OT, AIDE                 Agency:[facility] Phone:[phone]   Medical Equipment/Items Ordered: PATIENT TO GET ROLLATOR ROLLING WALKER ON HIS OWN-DAUGHTER AWARE                                                 Agency/Supplier:   GAVE PRIVATE DUTY LIST TO PATIENT AND DAUGHTER TO FOLLOW UP WITH   My questions have been answered and I understand these instructions. I will adhere to these goals and the provided educational materials after my discharge from the hospital.  Patient/Caregiver Signature _______________________________ Date __________  Clinician Signature _______________________________________ Date __________  Please bring this form and your medication list with you to all your follow-up doctor's appointments.

## 2020-04-20 ENCOUNTER — Telehealth: Payer: Self-pay

## 2020-04-20 NOTE — Telephone Encounter (Signed)
2nd TCM call attempt. Left message for pt to call back

## 2020-04-21 ENCOUNTER — Emergency Department (HOSPITAL_COMMUNITY): Payer: Medicare Other

## 2020-04-21 ENCOUNTER — Telehealth: Payer: Self-pay | Admitting: Registered Nurse

## 2020-04-21 ENCOUNTER — Telehealth: Payer: Self-pay

## 2020-04-21 ENCOUNTER — Inpatient Hospital Stay (HOSPITAL_COMMUNITY)
Admission: EM | Admit: 2020-04-21 | Discharge: 2020-04-28 | DRG: 028 | Disposition: A | Discharge status: Skilled Nursing Facility | Payer: Medicare Other | Attending: Neurosurgery | Admitting: Neurosurgery

## 2020-04-21 DIAGNOSIS — G935 Compression of brain: Secondary | ICD-10-CM | POA: Diagnosis present

## 2020-04-21 DIAGNOSIS — T84226A Displacement of internal fixation device of vertebrae, initial encounter: Secondary | ICD-10-CM | POA: Diagnosis present

## 2020-04-21 DIAGNOSIS — Z8249 Family history of ischemic heart disease and other diseases of the circulatory system: Secondary | ICD-10-CM

## 2020-04-21 DIAGNOSIS — S14125A Central cord syndrome at C5 level of cervical spinal cord, initial encounter: Principal | ICD-10-CM | POA: Diagnosis present

## 2020-04-21 DIAGNOSIS — Z880 Allergy status to penicillin: Secondary | ICD-10-CM

## 2020-04-21 DIAGNOSIS — W19XXXA Unspecified fall, initial encounter: Secondary | ICD-10-CM | POA: Diagnosis present

## 2020-04-21 DIAGNOSIS — Z79899 Other long term (current) drug therapy: Secondary | ICD-10-CM

## 2020-04-21 DIAGNOSIS — M19041 Primary osteoarthritis, right hand: Secondary | ICD-10-CM | POA: Diagnosis present

## 2020-04-21 DIAGNOSIS — S129XXA Fracture of neck, unspecified, initial encounter: Secondary | ICD-10-CM | POA: Diagnosis present

## 2020-04-21 DIAGNOSIS — F0390 Unspecified dementia without behavioral disturbance: Secondary | ICD-10-CM | POA: Diagnosis present

## 2020-04-21 DIAGNOSIS — G8250 Quadriplegia, unspecified: Secondary | ICD-10-CM | POA: Diagnosis present

## 2020-04-21 DIAGNOSIS — R531 Weakness: Secondary | ICD-10-CM | POA: Diagnosis not present

## 2020-04-21 DIAGNOSIS — Z87891 Personal history of nicotine dependence: Secondary | ICD-10-CM

## 2020-04-21 DIAGNOSIS — M19042 Primary osteoarthritis, left hand: Secondary | ICD-10-CM | POA: Diagnosis present

## 2020-04-21 DIAGNOSIS — S14104A Unspecified injury at C4 level of cervical spinal cord, initial encounter: Secondary | ICD-10-CM | POA: Diagnosis present

## 2020-04-21 DIAGNOSIS — J45909 Unspecified asthma, uncomplicated: Secondary | ICD-10-CM | POA: Diagnosis present

## 2020-04-21 DIAGNOSIS — I10 Essential (primary) hypertension: Secondary | ICD-10-CM | POA: Diagnosis present

## 2020-04-21 DIAGNOSIS — M4802 Spinal stenosis, cervical region: Secondary | ICD-10-CM | POA: Diagnosis present

## 2020-04-21 DIAGNOSIS — Z419 Encounter for procedure for purposes other than remedying health state, unspecified: Secondary | ICD-10-CM

## 2020-04-21 DIAGNOSIS — Z888 Allergy status to other drugs, medicaments and biological substances status: Secondary | ICD-10-CM

## 2020-04-21 DIAGNOSIS — Z981 Arthrodesis status: Secondary | ICD-10-CM

## 2020-04-21 DIAGNOSIS — Z7901 Long term (current) use of anticoagulants: Secondary | ICD-10-CM

## 2020-04-21 DIAGNOSIS — Z20822 Contact with and (suspected) exposure to covid-19: Secondary | ICD-10-CM | POA: Diagnosis present

## 2020-04-21 DIAGNOSIS — G959 Disease of spinal cord, unspecified: Secondary | ICD-10-CM | POA: Diagnosis present

## 2020-04-21 LAB — CBC WITH DIFFERENTIAL/PLATELET
Abs Immature Granulocytes: 0.04 10*3/uL (ref 0.00–0.07)
Basophils Absolute: 0.1 10*3/uL (ref 0.0–0.1)
Basophils Relative: 1 %
Eosinophils Absolute: 0 10*3/uL (ref 0.0–0.5)
Eosinophils Relative: 0 %
HCT: 47.4 % (ref 39.0–52.0)
Hemoglobin: 14.9 g/dL (ref 13.0–17.0)
Immature Granulocytes: 0 %
Lymphocytes Relative: 7 %
Lymphs Abs: 0.8 10*3/uL (ref 0.7–4.0)
MCH: 28.3 pg (ref 26.0–34.0)
MCHC: 31.4 g/dL (ref 30.0–36.0)
MCV: 89.9 fL (ref 80.0–100.0)
Monocytes Absolute: 0.7 10*3/uL (ref 0.1–1.0)
Monocytes Relative: 7 %
Neutro Abs: 8.8 10*3/uL — ABNORMAL HIGH (ref 1.7–7.7)
Neutrophils Relative %: 85 %
Platelets: 209 10*3/uL (ref 150–400)
RBC: 5.27 MIL/uL (ref 4.22–5.81)
RDW: 14.2 % (ref 11.5–15.5)
WBC: 10.3 10*3/uL (ref 4.0–10.5)
nRBC: 0 % (ref 0.0–0.2)

## 2020-04-21 LAB — BASIC METABOLIC PANEL
Anion gap: 16 — ABNORMAL HIGH (ref 5–15)
BUN: 7 mg/dL — ABNORMAL LOW (ref 8–23)
CO2: 18 mmol/L — ABNORMAL LOW (ref 22–32)
Calcium: 9.5 mg/dL (ref 8.9–10.3)
Chloride: 104 mmol/L (ref 98–111)
Creatinine, Ser: 1.16 mg/dL (ref 0.61–1.24)
GFR, Estimated: >60 mL/min (ref >60)
Glucose, Bld: 141 mg/dL — ABNORMAL HIGH (ref 70–99)
Potassium: 4.3 mmol/L (ref 3.5–5.1)
Sodium: 138 mmol/L (ref 135–145)

## 2020-04-21 LAB — URINALYSIS, ROUTINE W REFLEX MICROSCOPIC
Bilirubin Urine: NEGATIVE
Glucose, UA: NEGATIVE mg/dL
Ketones, ur: 20 mg/dL — AB
Nitrite: NEGATIVE
Protein, ur: NEGATIVE mg/dL
Specific Gravity, Urine: 1.011 (ref 1.005–1.030)
pH: 5 (ref 5.0–8.0)

## 2020-04-21 IMAGING — CT CT HEAD W/O CM
3 of 4 series · 13 of 47 positions shown, 15 images · non-contrast
Comparison: Cervical spine MRI dated [DATE] and radiograph
dated [DATE].

CLINICAL DATA: 74-year-old male with head trauma.

EXAM:
CT HEAD WITHOUT CONTRAST
CT CERVICAL SPINE WITHOUT CONTRAST
TECHNIQUE: Multidetector CT imaging of the head and cervical spine was
performed following the standard protocol without intravenous
contrast. Multiplanar CT image reconstructions of the cervical spine
were also generated.

[Series 5: head without cor · coronal · non-contrast · 0.32mm/px · 3 of 67 slices shown]
[im 23/67  brain]
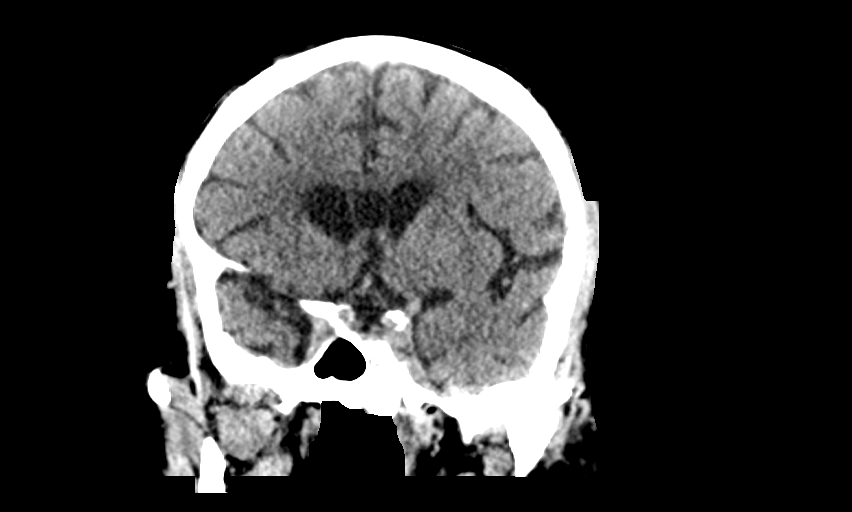
[im 30/67  brain]
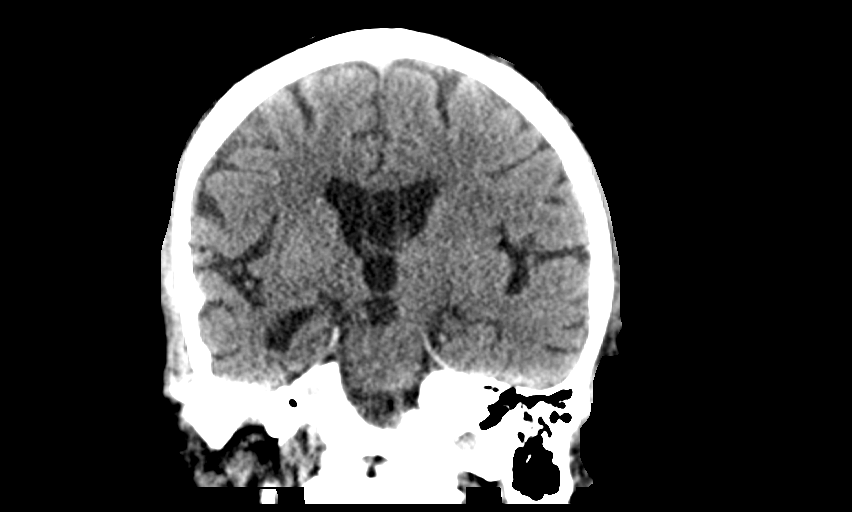
[im 37/67  brain]
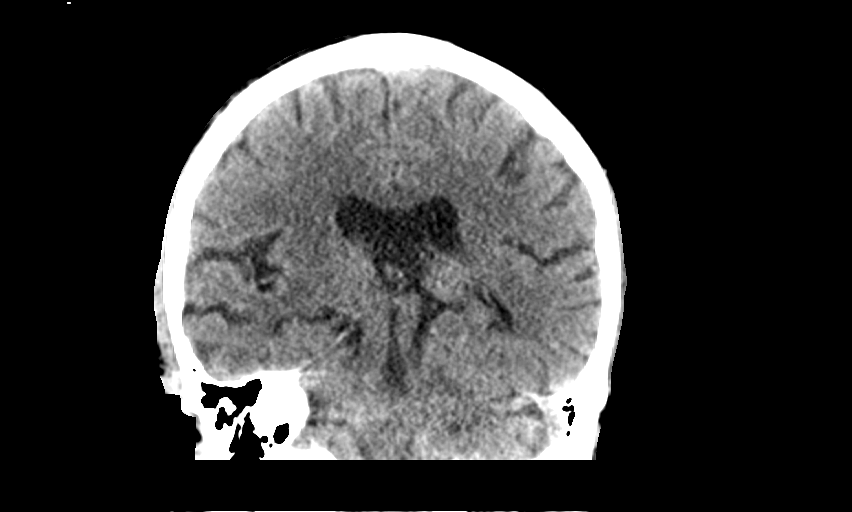

[Series 8: head without · axial · non-contrast · 0.43mm/px · z∈[-128,-8]mm · 7 of 33 slices shown, 9 images]
[im 5/33  brain]
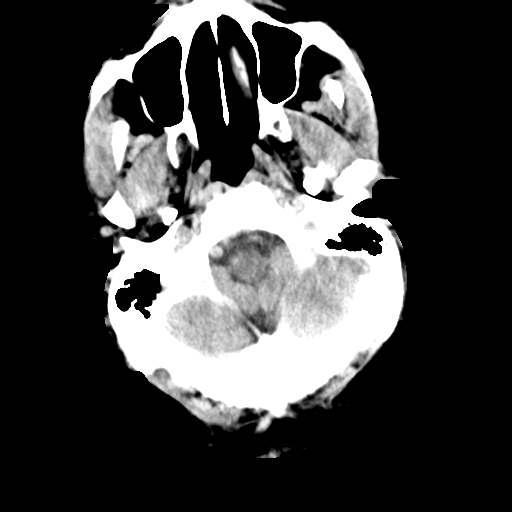
[im 5/33  bone]
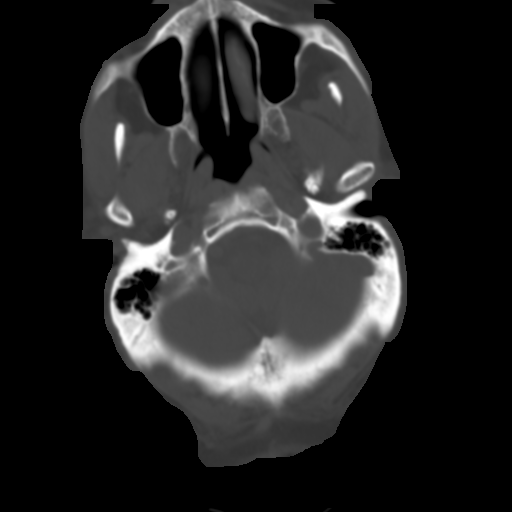
[im 9/33  brain]
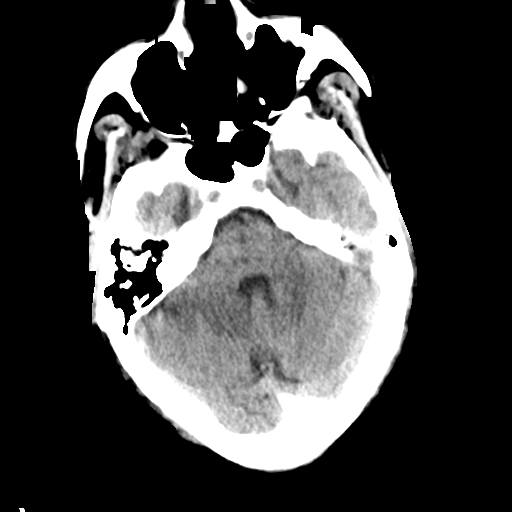
[im 13/33  brain]
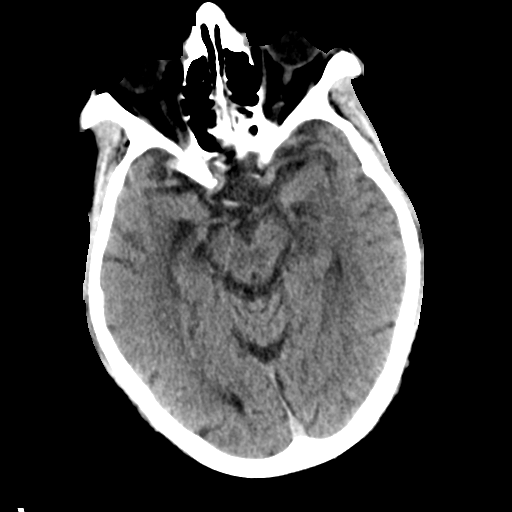
[im 17/33  brain]
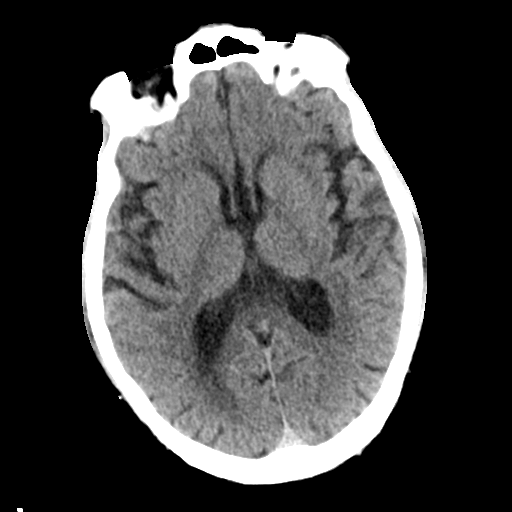
[im 21/33  brain]
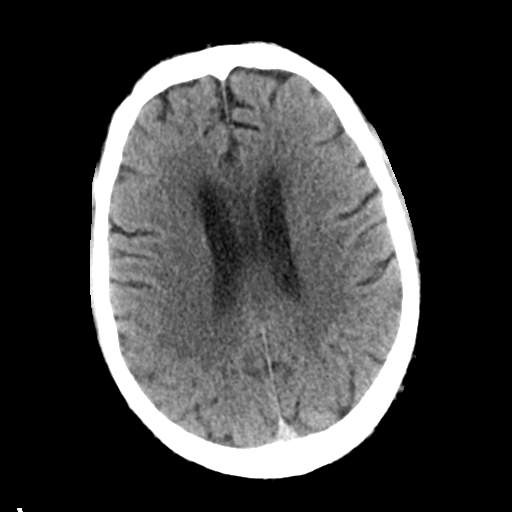
[im 21/33  bone]
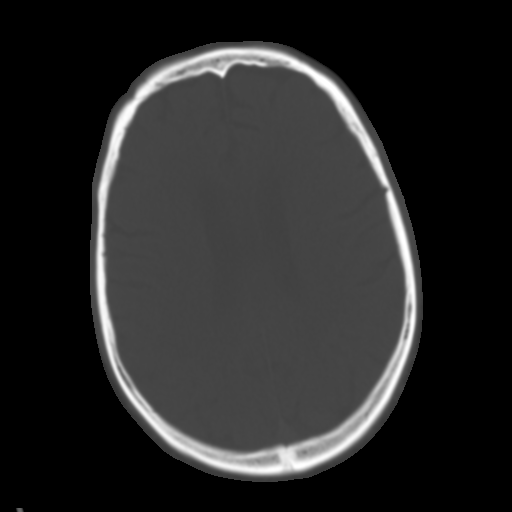
[im 25/33  brain]
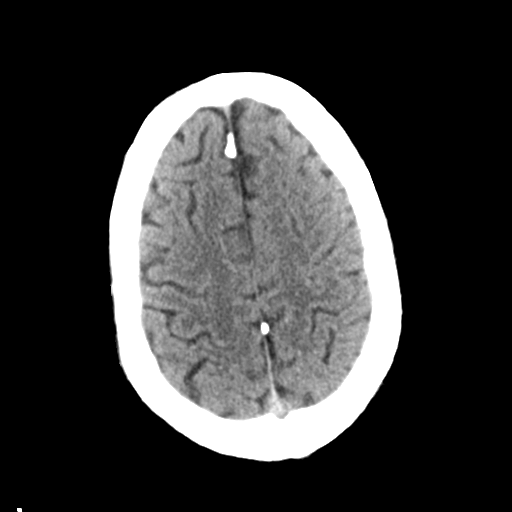
[im 29/33  brain]
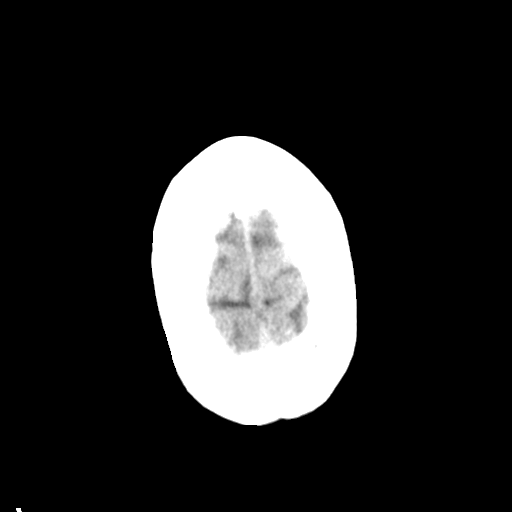

[Series 9: head without sag · sagittal · non-contrast · 0.32mm/px · 3 of 66 slices shown]
[im 22/66  brain]
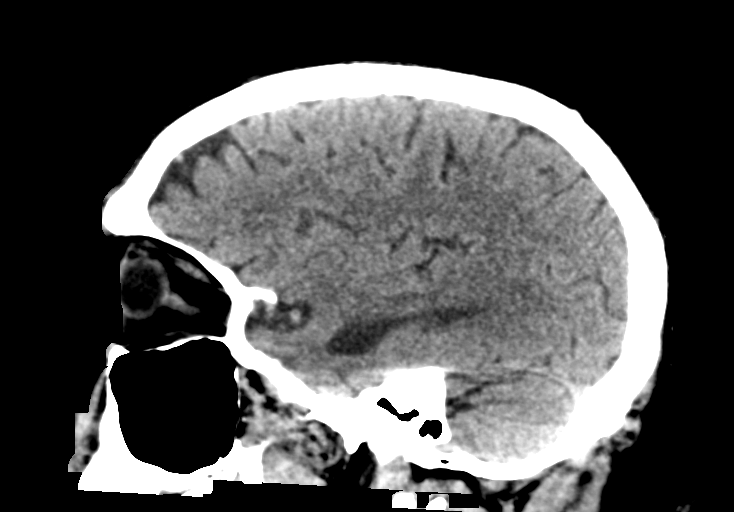
[im 33/66  brain]
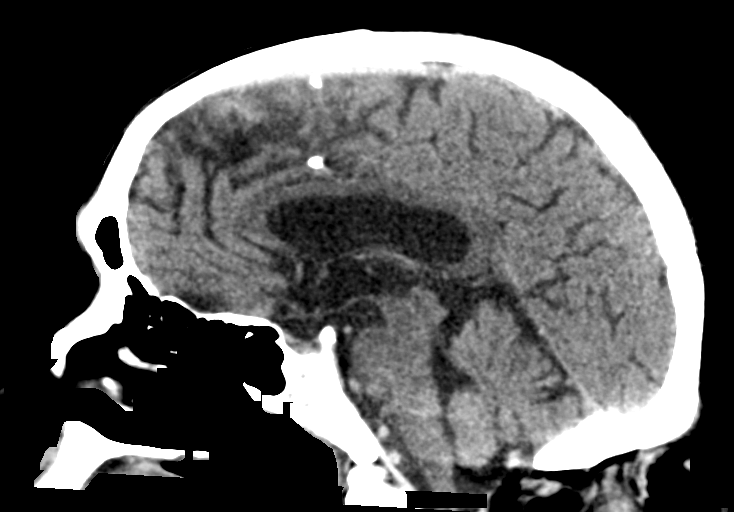
[im 44/66  brain]
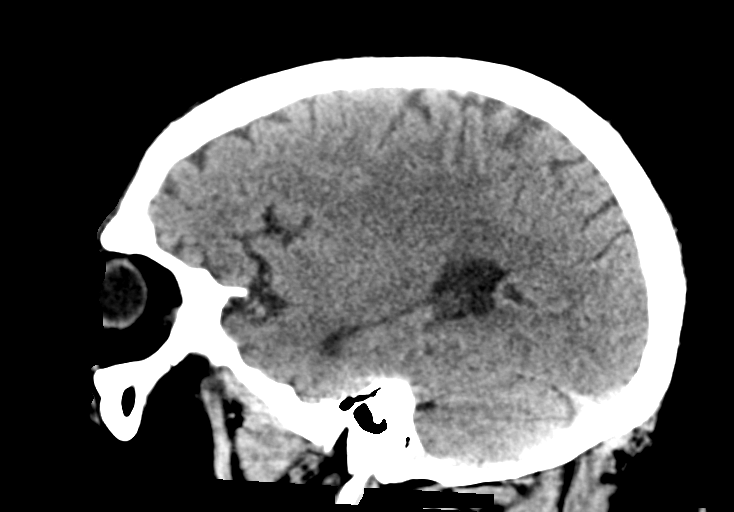

[13 of 47 positions shown; findings below may reference images not displayed]

FINDINGS: CT HEAD FINDINGS

Brain: There is mild age-related atrophy and chronic microvascular
ischemic changes. There is no acute intracranial hemorrhage. No mass
effect or midline shift. No extra-axial fluid collection.

Vascular: No hyperdense vessel or unexpected calcification.

Skull: No acute calvarial pathology. Postsurgical changes of the
suboccipital decompression.

Sinuses/Orbits: No acute finding.

Other: None

CT CERVICAL SPINE FINDINGS

Alignment: No acute subluxation.

Skull base and vertebrae: No acute fracture. Postsurgical changes
versus incomplete bony fusion of the posterior ring of C1.

Soft tissues and spinal canal: No prevertebral fluid or swelling. No
visible canal hematoma.

Disc levels: Postsurgical changes of C3-C5 ACDF. The fusion hardware
is intact. There are multilevel degenerative changes with
osteophyte.

Upper chest: Negative.

Other: None
IMPRESSION: 1. No acute intracranial pathology. Mild age-related atrophy and
chronic microvascular ischemic changes.
2. No acute/traumatic cervical spine pathology.
3. Postsurgical changes of C3-C5 ACDF.

## 2020-04-21 IMAGING — CT CT CERVICAL SPINE W/O CM
3 of 4 series · 12 of 33 positions shown, 14 images · non-contrast
Comparison: Cervical spine MRI dated [DATE] and radiograph
dated [DATE].

CLINICAL DATA: 74-year-old male with head trauma.

EXAM:
CT HEAD WITHOUT CONTRAST
CT CERVICAL SPINE WITHOUT CONTRAST
TECHNIQUE: Multidetector CT imaging of the head and cervical spine was
performed following the standard protocol without intravenous
contrast. Multiplanar CT image reconstructions of the cervical spine
were also generated.

[Series 6: c_spine 2.0 st · axial · 0.39mm/px · z∈[-280,-152]mm · 4 of 96 slices shown, 5 images]
[im 16/96  soft-tissue]
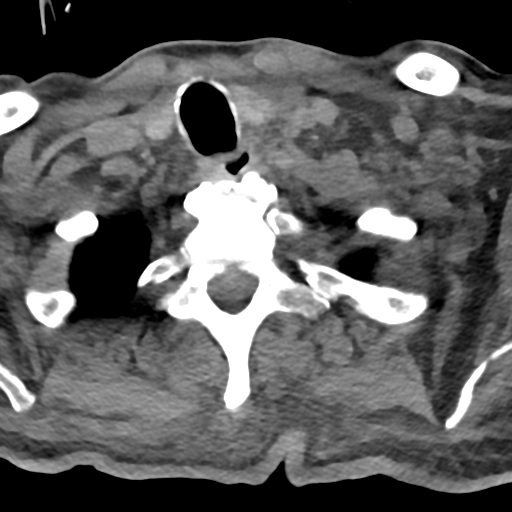
[im 16/96  bone]
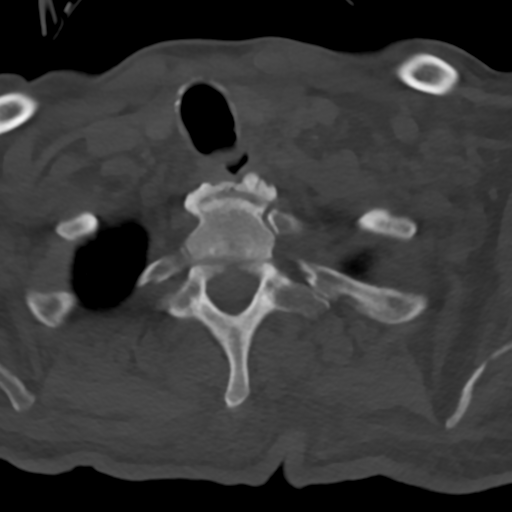
[im 32/96  bone]
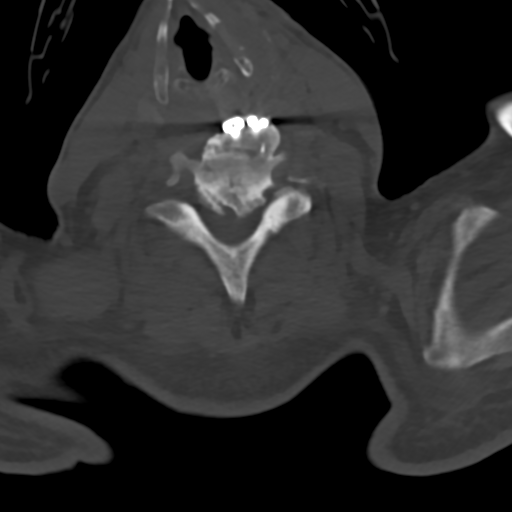
[im 64/96  bone]
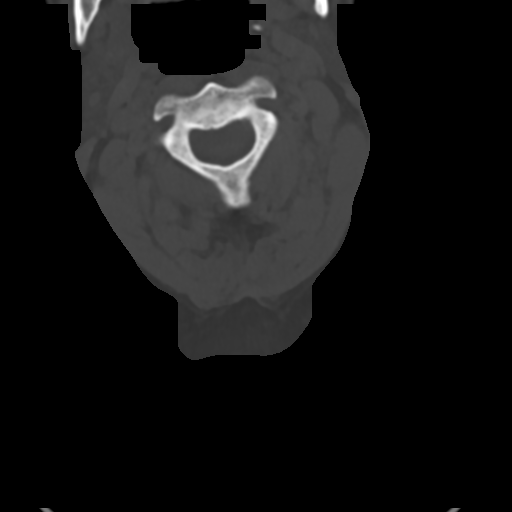
[im 80/96  bone]
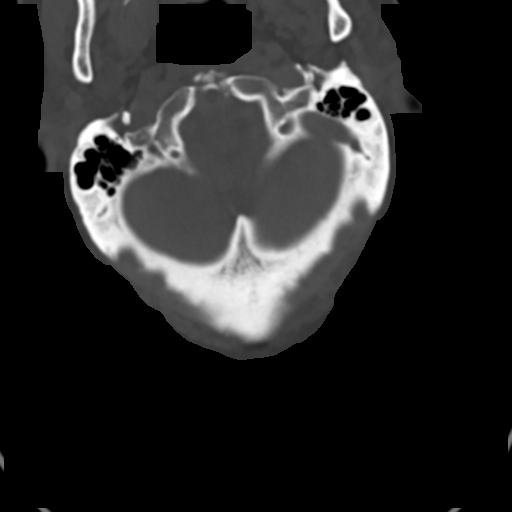

[Series 12: c_spine 2.0 sag bone · sagittal · 0.29mm/px · 5 of 61 slices shown, 6 images]
[im 21/61  bone]
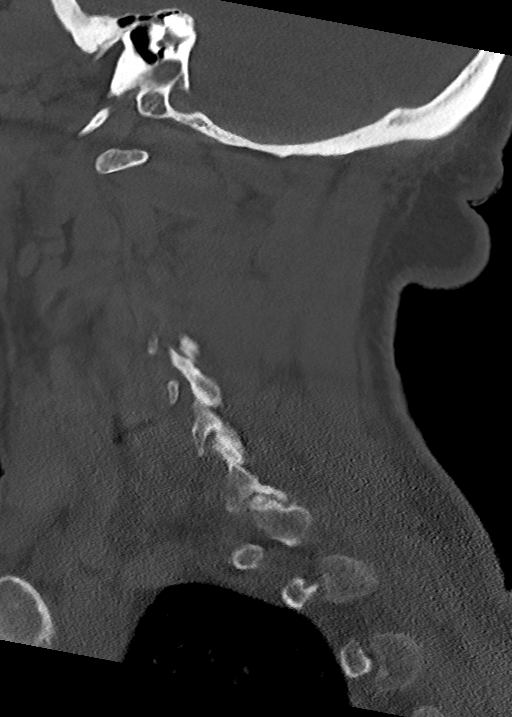
[im 26/61  bone]
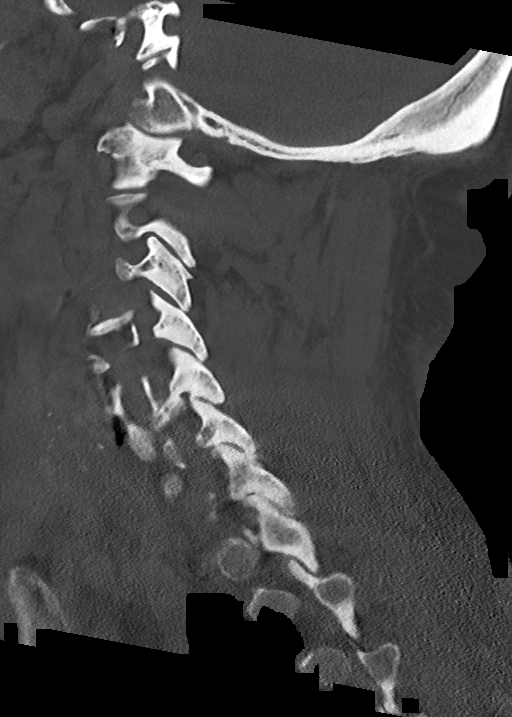
[im 31/61  soft-tissue]
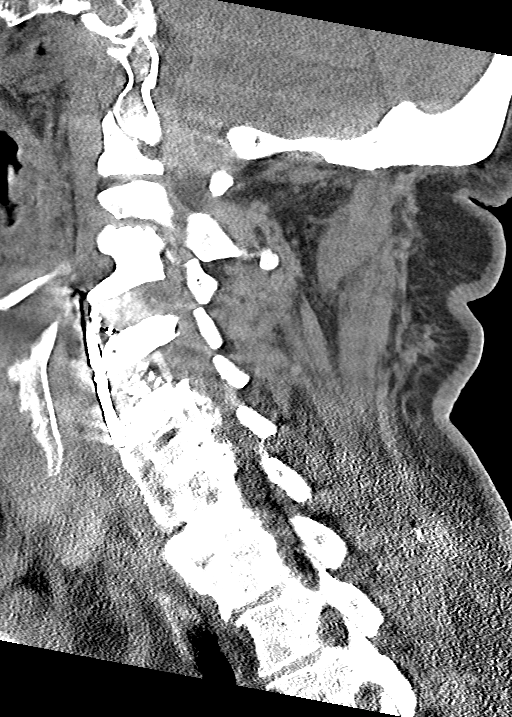
[im 31/61  bone]
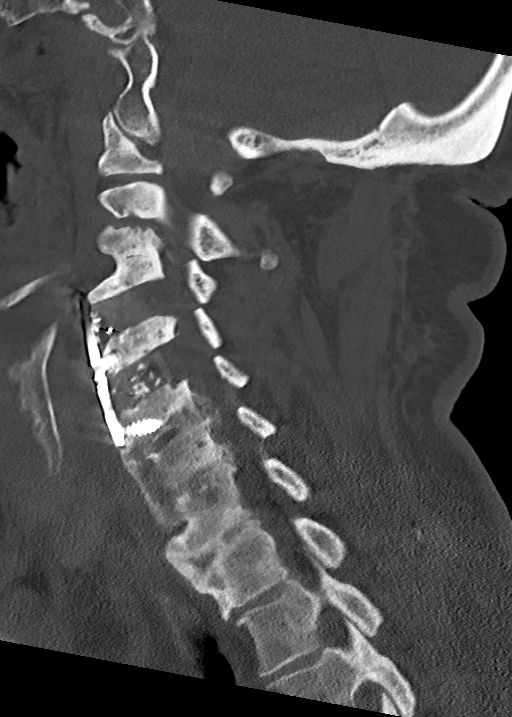
[im 36/61  bone]
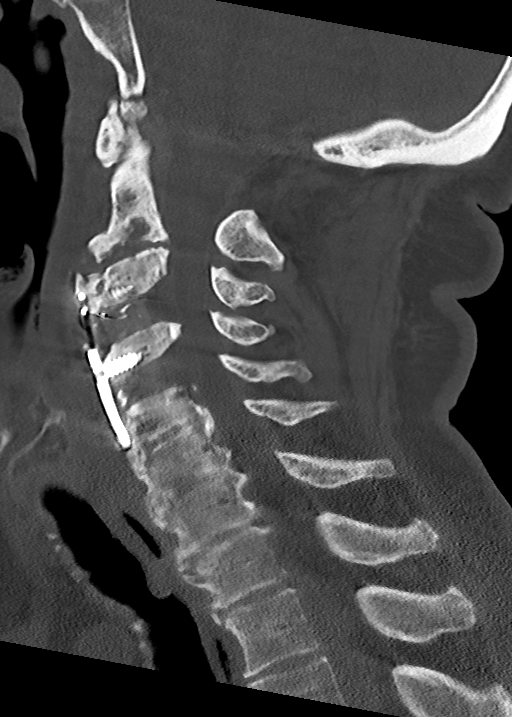
[im 41/61  bone]
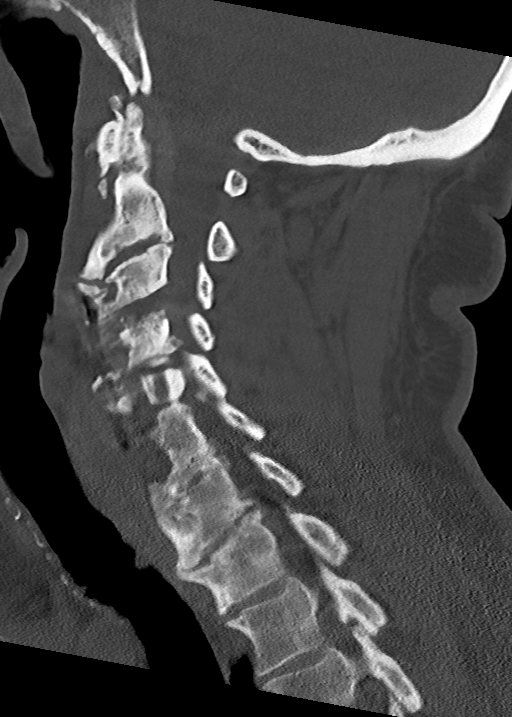

[Series 13: c_spine 2.0 cor bone · coronal · 0.27mm/px · 3 of 61 slices shown]
[im 13/61  bone]
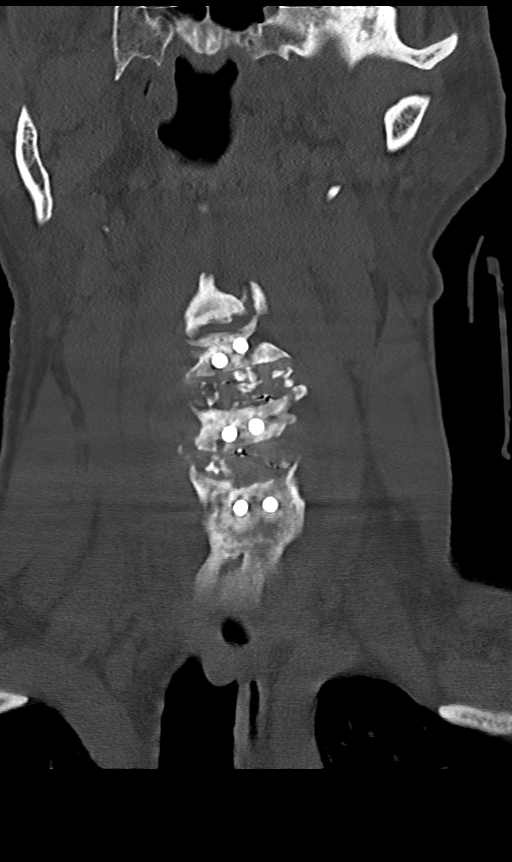
[im 25/61  bone]
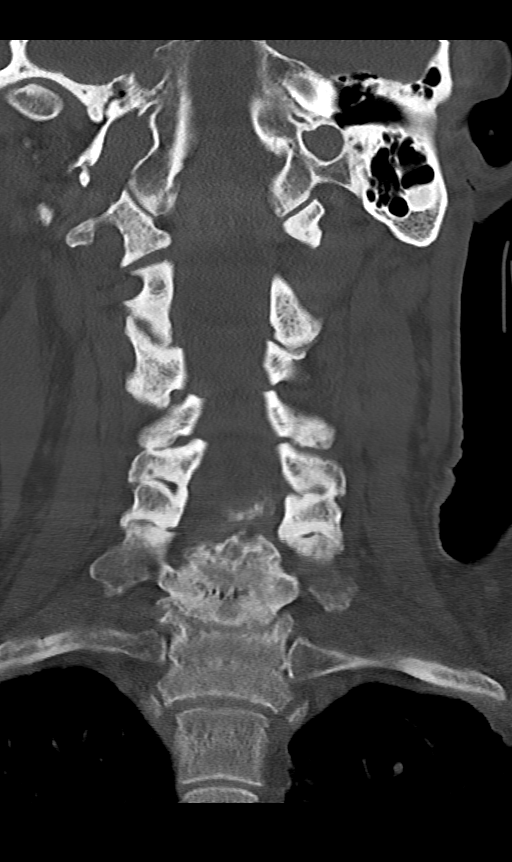
[im 37/61  bone]
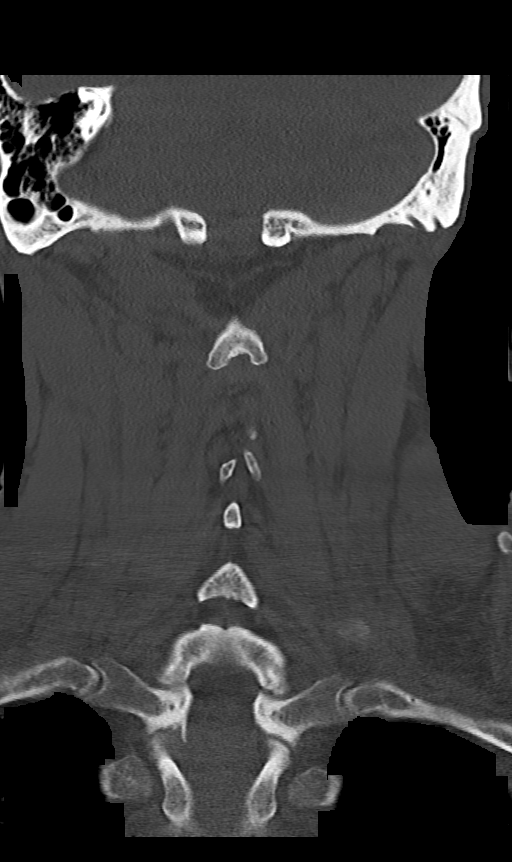

[12 of 33 positions shown; findings below may reference images not displayed]

FINDINGS: CT HEAD FINDINGS

Brain: There is mild age-related atrophy and chronic microvascular
ischemic changes. There is no acute intracranial hemorrhage. No mass
effect or midline shift. No extra-axial fluid collection.

Vascular: No hyperdense vessel or unexpected calcification.

Skull: No acute calvarial pathology. Postsurgical changes of the
suboccipital decompression.

Sinuses/Orbits: No acute finding.

Other: None

CT CERVICAL SPINE FINDINGS

Alignment: No acute subluxation.

Skull base and vertebrae: No acute fracture. Postsurgical changes
versus incomplete bony fusion of the posterior ring of C1.

Soft tissues and spinal canal: No prevertebral fluid or swelling. No
visible canal hematoma.

Disc levels: Postsurgical changes of C3-C5 ACDF. The fusion hardware
is intact. There are multilevel degenerative changes with
osteophyte.

Upper chest: Negative.

Other: None
IMPRESSION: 1. No acute intracranial pathology. Mild age-related atrophy and
chronic microvascular ischemic changes.
2. No acute/traumatic cervical spine pathology.
3. Postsurgical changes of C3-C5 ACDF.

## 2020-04-21 IMAGING — MR MR CERVICAL SPINE WO/W CM
6 of 8 series · 29 of 48 positions shown · IV contrast (gadavist)
Comparison: [DATE] CT cervical spine. [DATE] MRI cervical
spine.

CLINICAL DATA: Spinal cord injury, follow-up.

EXAM:
MRI CERVICAL SPINE WITHOUT AND WITH CONTRAST
TECHNIQUE: Multiplanar and multiecho pulse sequences of the cervical spine, to
include the craniocervical junction and cervicothoracic junction,
were obtained without and with intravenous contrast.
CONTRAST:  9mL GADAVIST GADOBUTROL 1 MMOL/ML IV SOLN

[Series 5: T2 · sagittal · 3.0mm · 0.69mm/px · 3 of 15 slices shown (1 of 2)]
[im 1/15]
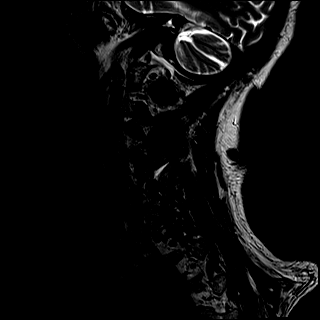
[im 8/15]
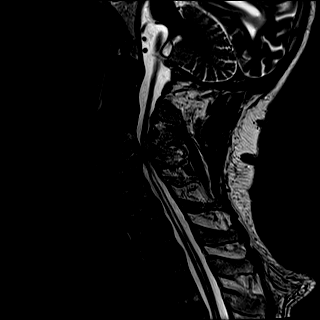
[im 15/15]
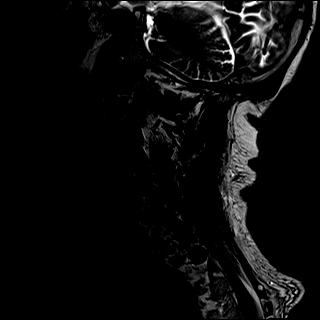

[Series 6: T1 · sagittal · 3.0mm · 0.69mm/px · 3 of 15 slices shown (1 of 2)]
[im 1/15]
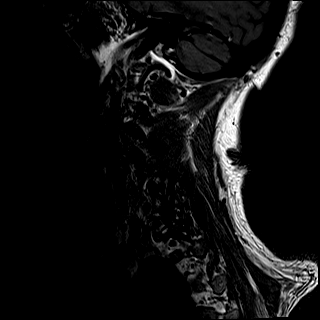
[im 8/15]
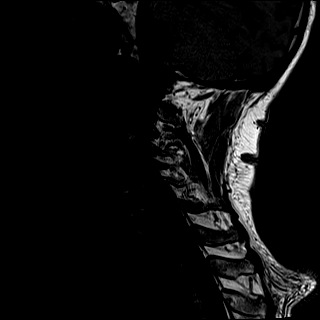
[im 15/15]
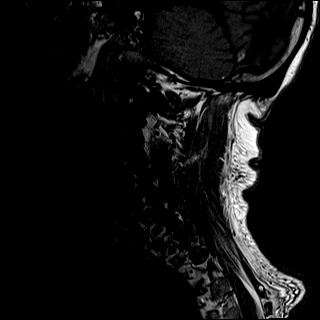

[Series 7: STIR · sagittal · 3.0mm · 0.86mm/px · 2 of 15 slices shown]
[im 1/15]
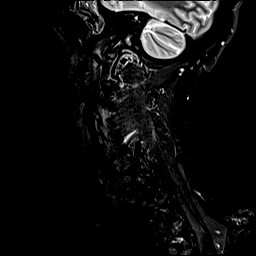
[im 8/15]
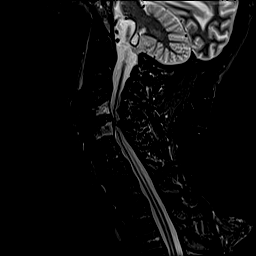

[Series 8: T2 · axial · 3.0mm · 0.66mm/px · z∈[-161,-47]mm · 9 of 40 slices shown (2 of 2)]
[im 1/40]
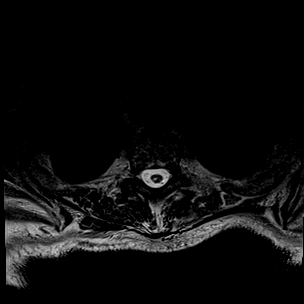
[im 5/40]
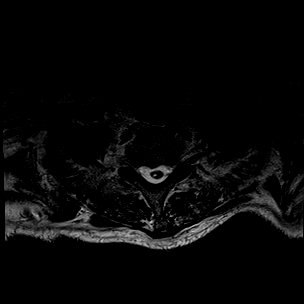
[im 10/40]
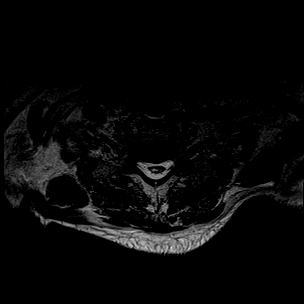
[im 15/40]
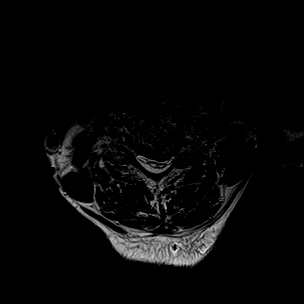
[im 20/40]
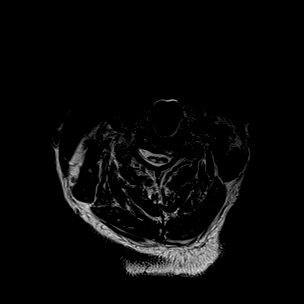
[im 25/40]
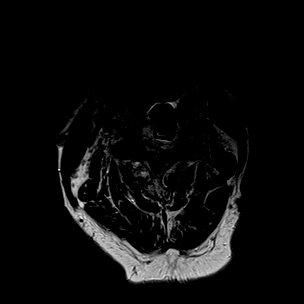
[im 30/40]
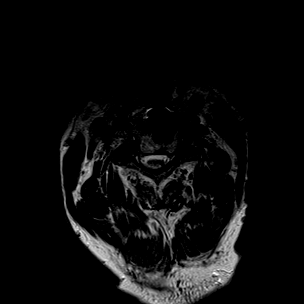
[im 35/40]
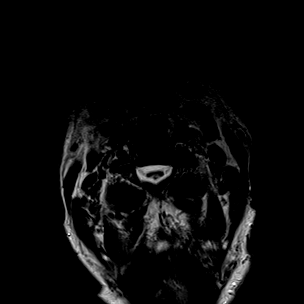
[im 40/40]
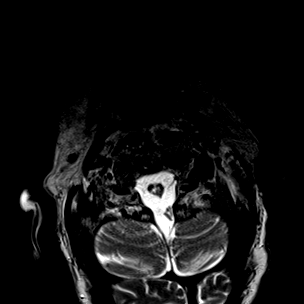

[Series 10: T1 · axial · 3.0mm · 0.39mm/px · z∈[-161,-47]mm · 9 of 40 slices shown (2 of 2)]
[im 1/40]
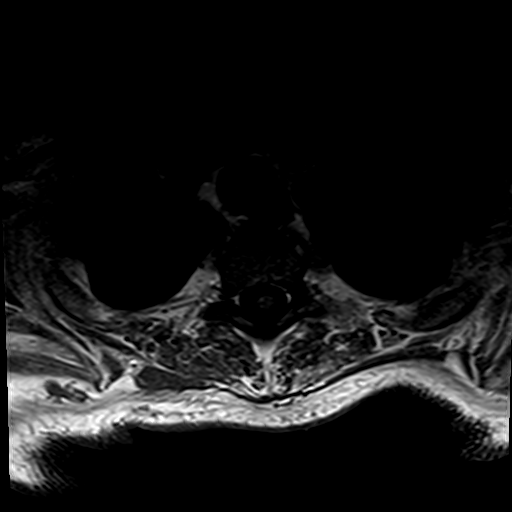
[im 5/40]
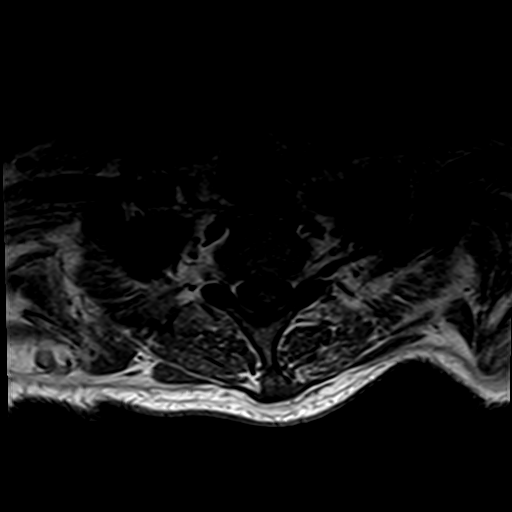
[im 10/40]
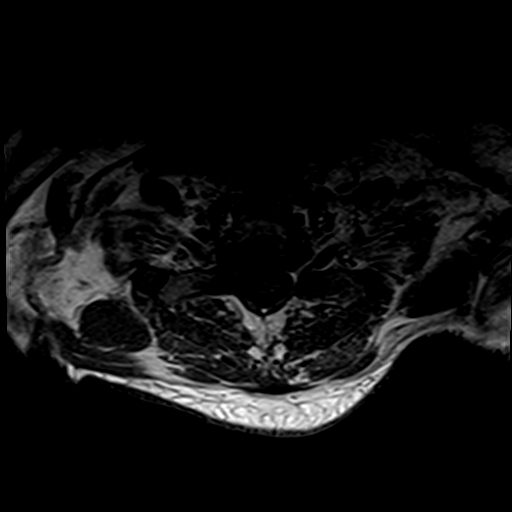
[im 15/40]
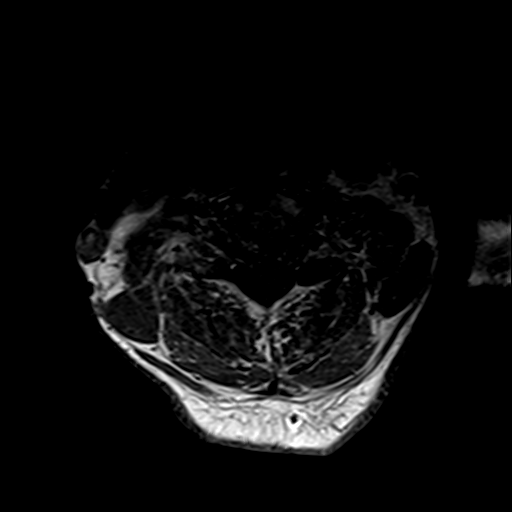
[im 20/40]
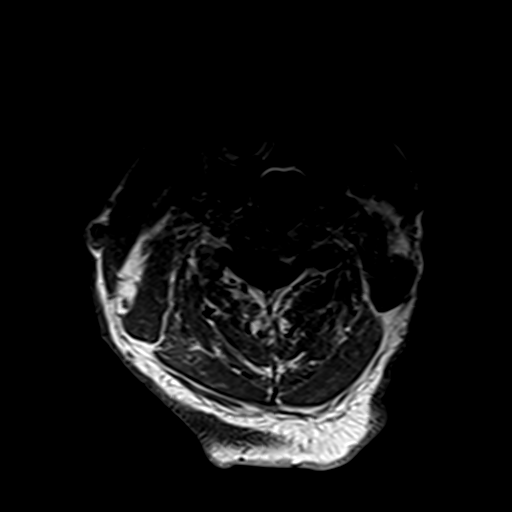
[im 25/40]
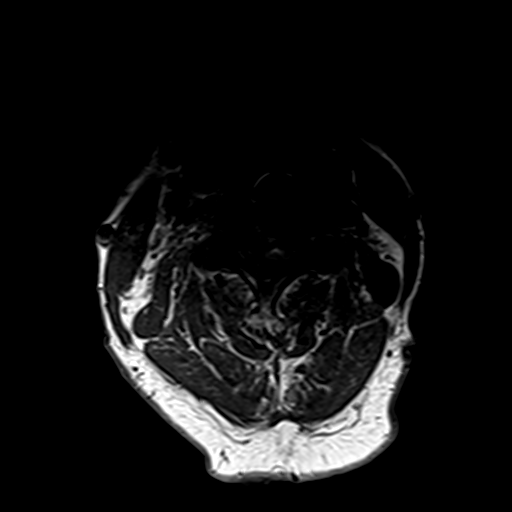
[im 30/40]
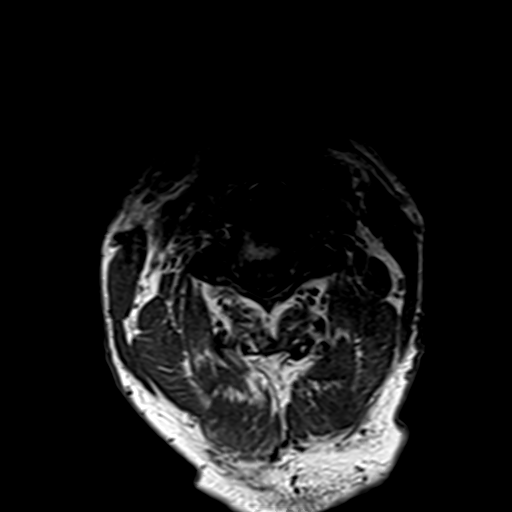
[im 35/40]
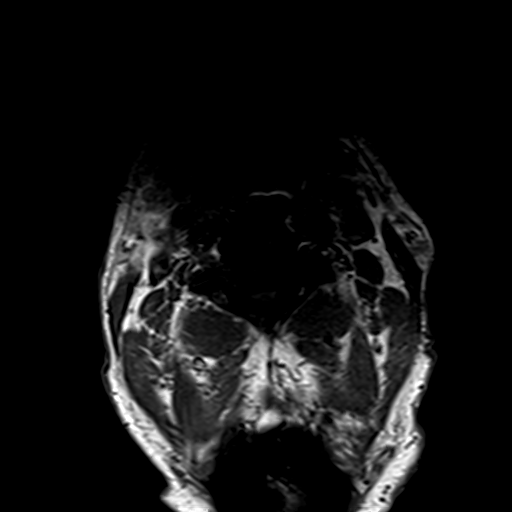
[im 40/40]
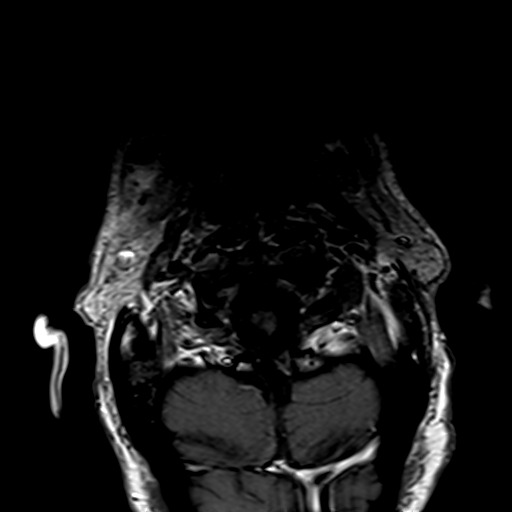

[Series 11: T1 fat-sat post-contrast · sagittal · 3.0mm · 0.43mm/px · 3 of 15 slices shown]
[im 1/15]
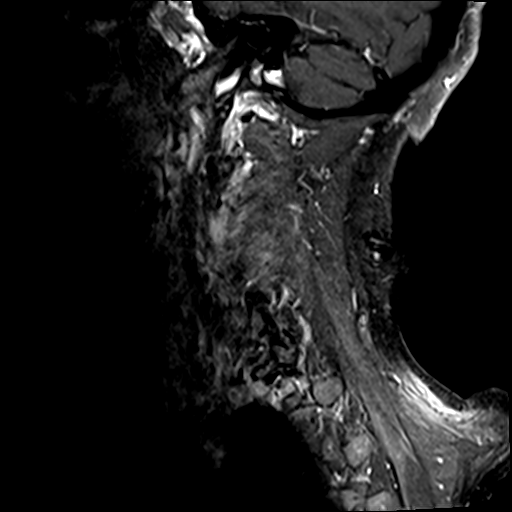
[im 8/15]
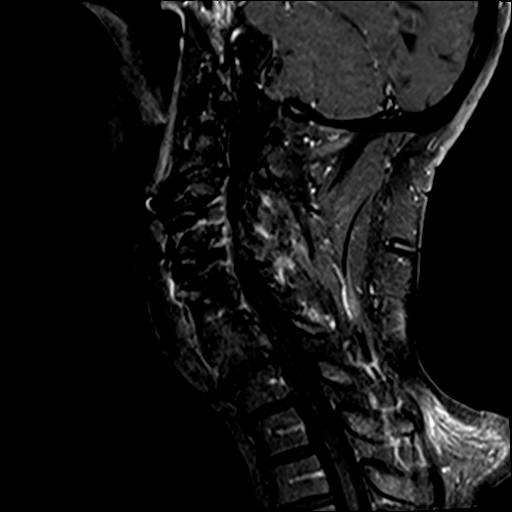
[im 15/15]
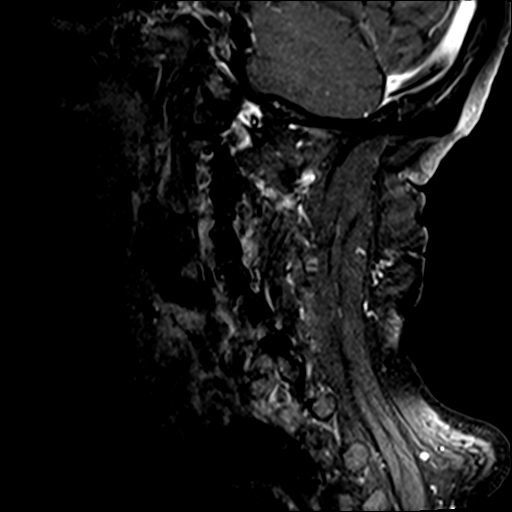

[29 of 48 positions shown; findings below may reference images not displayed]

FINDINGS: Alignment: Straightening of lordosis. Sequela of interval C3-5 ACDF.
Grade 1 C2-3 retrolisthesis.

Vertebrae: Diffuse bone marrow heterogeneity. No fracture or focal
osseous lesion.

Cord: Severe cord atrophy and T2 hyperintensity spanning the C2-T1
levels is unchanged. No abnormal cord enhancement.

Posterior Fossa, vertebral arteries: No acute finding.

Disc levels: Interbody spacers at the C3-5 levels. Multilevel
desiccation and disc space loss at the remaining cervical levels.

C2-3: Grade 1 retrolisthesis. Disc osteophyte complex partially
effacing the ventral CSF containing spaces. Uncovertebral and facet
hypertrophy. Patent spinal canal. Moderate to severe bilateral
neural foraminal narrowing.

C3-4: Sequela of fusion. Moderate to severe spinal canal narrowing.
Mild-to-moderate bilateral neural foraminal narrowing.

C4-5: Sequela of fusion. Mild spinal canal narrowing. Moderate left
and mild right neural foraminal narrowing.

C5-6: Disc osteophyte complex with prominent left paracentral
osteophyte abutting the ventral cord. Uncovertebral and facet
hypertrophy. Mild spinal canal and right neural foraminal narrowing.
Severe left neural foraminal narrowing.

C6-7: Disc osteophyte complex with uncovertebral and facet
hypertrophy. Dorsal osseous prominence partially effacing the
ventral CSF containing spaces. Mild spinal canal and moderate to
severe bilateral neural foraminal narrowing.

C7-T1: Disc osteophyte complex with right predominant uncovertebral
and facet hypertrophy. Central protrusion partially effacing the
ventral CSF containing spaces. Bilateral facet hypertrophy. Mild
spinal canal, severe right and mild-to-moderate left neural
foraminal narrowing.

Paraspinal tissues: Postsurgical appearance of the paraspinal soft
tissues. No focal fluid collection.
IMPRESSION: Sequela of C3-5 ACDF.  No acute fracture.

Advanced multilevel spondylosis. Moderate to severe spinal canal
narrowing most prominent at the C3-4 level is increased since prior
exam.

Cord atrophy and myelomalacia, unchanged. No focal cord edema or
fluid collection.

## 2020-04-21 MED ORDER — GADOBUTROL 1 MMOL/ML IV SOLN
9.0000 mL | Freq: Once | INTRAVENOUS | Status: AC | PRN
  Administered 2020-04-21: 9 mL via INTRAVENOUS

## 2020-04-21 MED ORDER — DEXAMETHASONE SODIUM PHOSPHATE 10 MG/ML IJ SOLN
10.0000 mg | Freq: Once | INTRAMUSCULAR | Status: AC
  Administered 2020-04-21: 10 mg via INTRAVENOUS
  Filled 2020-04-21: qty 1

## 2020-04-21 MED ORDER — DEXTROSE 50 % IV SOLN: 50.0000 mL | Freq: Once | INTRAVENOUS | Status: DC

## 2020-04-21 NOTE — Telephone Encounter (Signed)
Transition Care Management Unsuccessful Follow-up Telephone Call  Date of discharge and from where:  04/19/2020-[facility]  Attempts:  3rd Attempt  Reason for unsuccessful TCM follow-up call:  Left voice message

## 2020-04-21 NOTE — Telephone Encounter (Signed)
Noted: nurse unable to make phone contact with patient for TCM despite 3 attempts.

## 2020-04-21 NOTE — ED Notes (Signed)
 via Bladder scan

## 2020-04-21 NOTE — ED Notes (Signed)
Dr Rubin Payor made aware of post void residual

## 2020-04-21 NOTE — ED Notes (Signed)
Pt placed on bedpan

## 2020-04-21 NOTE — ED Triage Notes (Signed)
Pt bib ems from home with reports of increasing weakness since going home after spinal surgery earlier this month. Home aid states pt declining. Pt alert and oriented. Arrives in [place] collar.

## 2020-04-21 NOTE — Telephone Encounter (Signed)
Transitional Care call Daughter [name] daughter answered the transitional questions.   Patient name: Shannon [name]  DOB: 29-Mar-1946  1. Are you/is patient experiencing any problems since coming home? No a. Are there any questions regarding any aspect of care? No 2. Are there any questions regarding medications administration/dosing? No a. Are meds being taken as prescribed? Yes b. "Patient should review meds with caller to confirm" Medication List Reviewed. 3. Have there been any falls? Yes, he slipped out of bed this morning, daughter called EMS and he was evaluated. When the physical Therapist came to the home today, he encouraged family to have him evaluated at the hospital. At this time Mr. Cuartas is at [facility] ED.  4. Has Home Health been to the house and/or have they contacted you? Yes: [facility] a. If not, have you tried to contact them? NA b. Can we help you contact them? NA 5. Are bowels and bladder emptying properly? (                        ) a. Are there any unexpected incontinence issues? (                                  ) b. If applicable, is patient following bowel/bladder programs? (                          ) 6. Any fevers, problems with breathing, unexpected pain? No 7. Are there any skin problems or new areas of breakdown? No 8. Has the patient/family member arranged specialty MD follow up (ie cardiology/neurology/renal/surgical/etc.)?  Ms. [name] is aware of scheduling HFU appointments she states.  a. Can we help arrange? No 9. Does the patient need any other services or support that we can help arrange? NA/At the hospital at this time. 10. Are caregivers following through as expected in assisting the patient? Yes 11. Has the patient quit smoking, drinking alcohol, or using drugs as recommended? (                        )  Appointment date/time 05/02/2020  arrival time 10:40 for 11:00 Jones Bales  and who it is with here (                                        ) [phone]

## 2020-04-21 NOTE — ED Provider Notes (Signed)
[name] EMERGENCY DEPARTMENT Provider Note   CSN: [id number] Arrival date & time: 04/21/20  1448     History Chief Complaint  Patient presents with  . Weakness    Shannon [name] is a 74 y.o. male.  The history is provided by the patient.  Fall This is a new problem. The current episode started 6 to 12 hours ago. The problem has not changed since onset.Pertinent negatives include no chest pain, no abdominal pain, no headaches and no shortness of breath.  Extremity Weakness This is a recurrent problem. The current episode started 2 days ago (since dc home from rehab on 10/27). The problem occurs constantly. The problem has been gradually worsening. Pertinent negatives include no chest pain, no abdominal pain, no headaches and no shortness of breath. He has tried nothing for the symptoms.       Past Medical History:  Diagnosis Date  . Asthma   . 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 Newell Coral, 1992-->surg at [facility].  Summer 2021->Cervicalgia,arm pain, hand atrophy RUE wkness, hyperreflex (Dr. 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 Hay fever   . Hypertension   . Osteoarthritis of both hands     Patient Active Problem List   Diagnosis Date Noted  . Quadriplegia and quadriparesis (HCC)   . Acute blood loss anemia   . Slow transit constipation   . Essential hypertension   . Cervical myelopathy (HCC) 04/07/2020  . Quadriparesis (HCC)   . Benign essential HTN   . Post-operative pain   . Neuropathic pain   . Asthma   . 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 ANTERIOR CERVICAL DECOMP/DISCECTOMY FUSION N/A 04/05/2020   Procedure: ANTERIOR CERVICAL DECOMPRESSION/DISCECTOMY FUSION, INTERBODY PROSTHESIS, PLATE/SCREWS CERVICAL  THREE-CERVICAL FOUR, CERVICAL FOUR- CERVICAL FIVE;  Surgeon: Tressie Stalker, MD;  Location: [facility] OR;  Service: Neurosurgery;  Laterality: N/A;  . BACK SURGERY    . INCISION AND DRAINAGE Left 2012   L hand infection  . ROTATOR CUFF REPAIR Right    x 3  . SPINE SURGERY         Family History  Problem Relation Age of Onset  . Heart attack Mother   . Heart disease Mother   . High blood pressure Mother   . Alcohol abuse Father   . Cancer Father   . Diabetes Father   . High blood pressure Father     Social History   Tobacco Use  . Smoking status: Former Games developer  . Smokeless tobacco: Never Used  Vaping Use  . Vaping Use: Never used  Substance Use Topics  . Alcohol use: Not Currently  . Drug use: Never    Home Medications Prior to Admission medications   Medication Sig Start Date End Date Taking? Authorizing Provider  amLODipine (NORVASC) 10 MG tablet Take 1 tablet (10 mg total) by mouth daily. 02/16/20   McGowen, Maryjean Morn, MD  apixaban (ELIQUIS) 2.5 MG TABS tablet Take 1 tablet (2.5 mg total) by mouth 2 (two) times daily. 04/19/20   Love, [name], PA-C  docusate sodium (COLACE) 100 MG capsule Take 1 capsule (100 mg total) by mouth 2 (two) times daily. 04/19/20   Love, [name], PA-C  pantoprazole (PROTONIX) 40 MG tablet Take 1 tablet (40 mg total) by mouth at  bedtime. 04/19/20   Love, [name], PA-C    Allergies    Iodine and Penicillins  Review of Systems   Review of Systems  Constitutional: Negative for chills and fever.  HENT: Negative for congestion and sore throat.   Respiratory: Negative for cough and shortness of breath.   Cardiovascular: Negative for chest pain.  Gastrointestinal: Negative for abdominal pain and vomiting.  Genitourinary: Positive for decreased urine volume and difficulty urinating.  Musculoskeletal: Positive for extremity weakness. Negative for neck pain.  Neurological: Positive for weakness. Negative for numbness and headaches.  All other  systems reviewed and are negative.   Physical Exam Updated Vital Signs BP 119/69   Pulse (!) 111   Temp 98.6 F (37 C)   Resp 19   SpO2 95%   Physical Exam Vitals reviewed.  Constitutional:      General: He is not in acute distress.    Appearance: Normal appearance.  HENT:     Head: Normocephalic and atraumatic.     Nose: Nose normal.     Mouth/Throat:     Mouth: Mucous membranes are moist.     Pharynx: Oropharynx is clear.  Eyes:     Conjunctiva/sclera: Conjunctivae normal.  Neck:     Comments: c-collar in place Cardiovascular:     Rate and Rhythm: Normal rate.     Heart sounds: Normal heart sounds.  Pulmonary:     Effort: Pulmonary effort is normal.     Breath sounds: Normal breath sounds.  Abdominal:     General: Abdomen is flat.     Palpations: Abdomen is soft.     Tenderness: There is abdominal tenderness.     Comments: Suprapubic tenderness  Musculoskeletal:     Right lower leg: No edema.     Left lower leg: No edema.  Skin:    General: Skin is warm and dry.  Neurological:     Mental Status: He is alert.     Motor: Weakness present.     Comments: Cannot lift R or L arm against gravity. Grip strength L 2/5 and 3/5 R hand. Cannot lift either leg against gravity, ankle extension and flexion 4/5 bilaterally      ED Results / Procedures / Treatments   Labs (all labs ordered are listed, but only abnormal results are displayed) Labs Reviewed  CBC WITH DIFFERENTIAL/PLATELET - Abnormal; Notable for the following components:      Result Value   Neutro Abs 8.8 (*)    All other components within normal limits  BASIC METABOLIC PANEL - Abnormal; Notable for the following components:   CO2 18 (*)    Glucose, Bld 141 (*)    BUN 7 (*)    Anion gap 16 (*)    All other components within normal limits  URINALYSIS, ROUTINE W REFLEX MICROSCOPIC - Abnormal; Notable for the following components:   APPearance HAZY (*)    Hgb urine dipstick SMALL (*)    Ketones, ur 20  (*)    Leukocytes,Ua LARGE (*)    Bacteria, UA RARE (*)    All other components within normal limits  RESPIRATORY PANEL BY RT PCR (FLU A&B, COVID)    EKG EKG Interpretation  Date/Time:  Friday April 21 2020 16:13:23 EDT Ventricular Rate:  117 PR Interval:    QRS Duration: 96 QT Interval:  327 QTC Calculation: 457 R Axis:   -49 Text Interpretation: Sinus tachycardia Left anterior fascicular block Low voltage, extremity leads Abnormal R-wave progression, late transition  Confirmed by Benjiman Core [phone]) on 04/21/2020 4:25:20 PM   Radiology CT Head Wo Contrast  Result Date: 04/21/2020 CLINICAL DATA:  74 year old male with head trauma. EXAM: CT HEAD WITHOUT CONTRAST CT CERVICAL SPINE WITHOUT CONTRAST TECHNIQUE: Multidetector CT imaging of the head and cervical spine was performed following the standard protocol without intravenous contrast. Multiplanar CT image reconstructions of the cervical spine were also generated. COMPARISON:  Cervical spine MRI dated 03/08/2020 and radiograph dated 04/05/2020. FINDINGS: CT HEAD FINDINGS Brain: There is mild age-related atrophy and chronic microvascular ischemic changes. There is no acute intracranial hemorrhage. No mass effect or midline shift. No extra-axial fluid collection. Vascular: No hyperdense vessel or unexpected calcification. Skull: No acute calvarial pathology. Postsurgical changes of the suboccipital decompression. Sinuses/Orbits: No acute finding. Other: None CT CERVICAL SPINE FINDINGS Alignment: No acute subluxation. Skull base and vertebrae: No acute fracture. Postsurgical changes versus incomplete bony fusion of the posterior ring of C1. Soft tissues and spinal canal: No prevertebral fluid or swelling. No visible canal hematoma. Disc levels: Postsurgical changes of C3-C5 ACDF. The fusion hardware is intact. There are multilevel degenerative changes with osteophyte. Upper chest: Negative. Other: None IMPRESSION: 1. No acute  intracranial pathology. Mild age-related atrophy and chronic microvascular ischemic changes. 2. No acute/traumatic cervical spine pathology. 3. Postsurgical changes of C3-C5 ACDF. Electronically Signed   By: Elgie Collard M.D.   On: 04/21/2020 18:17   CT Cervical Spine Wo Contrast  Result Date: 04/21/2020 CLINICAL DATA:  74 year old male with head trauma. EXAM: CT HEAD WITHOUT CONTRAST CT CERVICAL SPINE WITHOUT CONTRAST TECHNIQUE: Multidetector CT imaging of the head and cervical spine was performed following the standard protocol without intravenous contrast. Multiplanar CT image reconstructions of the cervical spine were also generated. COMPARISON:  Cervical spine MRI dated 03/08/2020 and radiograph dated 04/05/2020. FINDINGS: CT HEAD FINDINGS Brain: There is mild age-related atrophy and chronic microvascular ischemic changes. There is no acute intracranial hemorrhage. No mass effect or midline shift. No extra-axial fluid collection. Vascular: No hyperdense vessel or unexpected calcification. Skull: No acute calvarial pathology. Postsurgical changes of the suboccipital decompression. Sinuses/Orbits: No acute finding. Other: None CT CERVICAL SPINE FINDINGS Alignment: No acute subluxation. Skull base and vertebrae: No acute fracture. Postsurgical changes versus incomplete bony fusion of the posterior ring of C1. Soft tissues and spinal canal: No prevertebral fluid or swelling. No visible canal hematoma. Disc levels: Postsurgical changes of C3-C5 ACDF. The fusion hardware is intact. There are multilevel degenerative changes with osteophyte. Upper chest: Negative. Other: None IMPRESSION: 1. No acute intracranial pathology. Mild age-related atrophy and chronic microvascular ischemic changes. 2. No acute/traumatic cervical spine pathology. 3. Postsurgical changes of C3-C5 ACDF. Electronically Signed   By: Elgie Collard M.D.   On: 04/21/2020 18:17   MR Cervical Spine W or Wo Contrast  Result Date:  04/21/2020 CLINICAL DATA:  Spinal cord injury, follow-up. EXAM: MRI CERVICAL SPINE WITHOUT AND WITH CONTRAST TECHNIQUE: Multiplanar and multiecho pulse sequences of the cervical spine, to include the craniocervical junction and cervicothoracic junction, were obtained without and with intravenous contrast. CONTRAST:  11mL GADAVIST GADOBUTROL 1 MMOL/ML IV SOLN COMPARISON:  04/21/2020 CT cervical spine. 03/08/2020 MRI cervical spine. FINDINGS: Alignment: Straightening of lordosis. Sequela of interval C3-5 ACDF. Grade 1 C2-3 retrolisthesis. Vertebrae: Diffuse bone marrow heterogeneity. No fracture or focal osseous lesion. Cord: Severe cord atrophy and T2 hyperintensity spanning the C2-T1 levels is unchanged. No abnormal cord enhancement. Posterior Fossa, vertebral arteries: No acute finding. Disc levels: Interbody spacers at the C3-5 levels.  Multilevel desiccation and disc space loss at the remaining cervical levels. C2-3: Grade 1 retrolisthesis. Disc osteophyte complex partially effacing the ventral CSF containing spaces. Uncovertebral and facet hypertrophy. Patent spinal canal. Moderate to severe bilateral neural foraminal narrowing. C3-4: Sequela of fusion. Moderate to severe spinal canal narrowing. Mild-to-moderate bilateral neural foraminal narrowing. C4-5: Sequela of fusion. Mild spinal canal narrowing. Moderate left and mild right neural foraminal narrowing. C5-6: Disc osteophyte complex with prominent left paracentral osteophyte abutting the ventral cord. Uncovertebral and facet hypertrophy. Mild spinal canal and right neural foraminal narrowing. Severe left neural foraminal narrowing. C6-7: Disc osteophyte complex with uncovertebral and facet hypertrophy. Dorsal osseous prominence partially effacing the ventral CSF containing spaces. Mild spinal canal and moderate to severe bilateral neural foraminal narrowing. C7-T1: Disc osteophyte complex with right predominant uncovertebral and facet hypertrophy. Central  protrusion partially effacing the ventral CSF containing spaces. Bilateral facet hypertrophy. Mild spinal canal, severe right and mild-to-moderate left neural foraminal narrowing. Paraspinal tissues: Postsurgical appearance of the paraspinal soft tissues. No focal fluid collection. IMPRESSION: Sequela of C3-5 ACDF.  No acute fracture. Advanced multilevel spondylosis. Moderate to severe spinal canal narrowing most prominent at the C3-4 level is increased since prior exam. Cord atrophy and myelomalacia, unchanged. No focal cord edema or fluid collection. Electronically Signed   By: Stana Bunting M.D.   On: 04/21/2020 21:37    Procedures Procedures (including critical care time)  Medications Ordered in ED Medications  dexamethasone (DECADRON) injection 10 mg (has no administration in time range)  gadobutrol (GADAVIST) 1 MMOL/ML injection 9 mL (9 mLs Intravenous Contrast Given 04/21/20 2059)    ED Course  I have reviewed the triage vital signs and the nursing notes.  Pertinent labs & imaging results that were available during my care of the patient were reviewed by me and considered in my medical decision making (see chart for details).    MDM Rules/Calculators/A&P                           Medical Decision Making: Shannon [name] is a 74 y.o. male who presented to the ED today with worsening bilateral upper and lower extremity weakness. Pt reports sx worsening since 10/27. Pt reports two fall sin last 24 hours, occurring at home, while trying to get to and from bathroom, pt denies LOC or head trauma.   Past medical history significant for asthma, htn, chiari malformation (w syringomyelia, quadraparesis L>R)  Recent surgery 04/05/20- ANTERIOR CERVICAL DECOMPRESSION/DISCECTOMY FUSION, INTERBODY PROSTHESIS, PLATE/SCREWS Z3-A0, C4- C5  Pt was d/c home with daughter on 04/18/20.  Reviewed and confirmed nursing documentation for past medical history, family history, social history.  On my  initial exam, the pt was in NAD, in C-collar, tachycardic, suprapubic tenderness, weak in bilateral UE and LE proximally   Consults: Neurosurgery  EKG shows sinus tachycardia, 117 bpm, abnormal R wave progression, with left anterior fascicular block. No acute ischemic changes, no STEMI.   Bladder scan 700, foley placed.   MRI shows advanced multilevel spondylosis. Moderate to severe spinal canal  narrowing most prominent at the C3-4 level is increased since prior  exam.   All radiology and laboratory studies reviewed independently and with my attending physician, agree with reading provided by radiologist unless otherwise noted.   [facility] planning to take pt to OR. Pt given decadron.    Based on the above findings, I believe patient requires admission.   PLAN; to OR   The above  care was discussed with and agreed upon by my attending physician.     Emergency Department Medication Summary:  Medications  dexamethasone (DECADRON) injection 10 mg (has no administration in time range)  gadobutrol (GADAVIST) 1 MMOL/ML injection 9 mL (9 mLs Intravenous Contrast Given 04/21/20 2059)       Final Clinical Impression(s) / ED Diagnoses Final diagnoses:  Weakness    Rx / DC Orders ED Discharge Orders    None       Brantley FlingLyons, Carlie Corpus, MD 04/21/20 2322    Benjiman CorePickering, Nathan, MD 04/21/20 2357

## 2020-04-21 NOTE — H&P (Signed)
Shannon [name] is an 74 y.o. male.   Chief Complaint: Weakness HPI: 74 year old underwent two-level ACDF 2 weeks ago went to rehab has history of MS.  Presented to ER with weakness of unknown duration of progression patient is unable to tell me how long this has been going on or when it really got bad form however now he feels numb and weak in his arms and legs.  Past Medical History:  Diagnosis Date  . Asthma   . 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 Newell Coral, 1992-->surg at [facility].  Summer 2021->Cervicalgia,arm pain, hand atrophy RUE wkness, hyperreflex (Dr. 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 Hay fever   . Hypertension   . Osteoarthritis of both hands     Past Surgical History:  Procedure Laterality Date  . ANTERIOR CERVICAL DECOMP/DISCECTOMY FUSION N/A 04/05/2020   Procedure: ANTERIOR CERVICAL DECOMPRESSION/DISCECTOMY FUSION, INTERBODY PROSTHESIS, PLATE/SCREWS CERVICAL THREE-CERVICAL FOUR, CERVICAL FOUR- CERVICAL FIVE;  Surgeon: Tressie Stalker, MD;  Location: [facility] OR;  Service: Neurosurgery;  Laterality: N/A;  . BACK SURGERY    . INCISION AND DRAINAGE Left 2012   L hand infection  . ROTATOR CUFF REPAIR Right    x 3  . SPINE SURGERY      Family History  Problem Relation Age of Onset  . Heart attack Mother   . Heart disease Mother   . High blood pressure Mother   . Alcohol abuse Father   . Cancer Father   . Diabetes Father   . High blood pressure Father    Social History:  reports that he has quit smoking. He has never used smokeless tobacco. He reports previous alcohol use. He reports that he does not use drugs.  Allergies:  Allergies  Allergen Reactions  . Iodine Swelling  . Penicillins Swelling    Facial swelling, itchy throat    (Not in a hospital admission)   Results for orders placed or performed during the  hospital encounter of 04/21/20 (from the past 48 hour(s))  CBC with Differential     Status: Abnormal   Collection Time: 04/21/20  3:23 PM  Result Value Ref Range   WBC 10.3 4.0 - 10.5 K/uL   RBC 5.27 4.22 - 5.81 MIL/uL   Hemoglobin 14.9 13.0 - 17.0 g/dL   HCT 40.3 39 - 52 %   MCV 89.9 80.0 - 100.0 fL   MCH 28.3 26.0 - 34.0 pg   MCHC 31.4 30.0 - 36.0 g/dL   RDW 47.4 25.9 - 56.3 %   Platelets 209 150 - 400 K/uL   nRBC 0.0 0.0 - 0.2 %   Neutrophils Relative % 85 %   Neutro Abs 8.8 (H) 1.7 - 7.7 K/uL   Lymphocytes Relative 7 %   Lymphs Abs 0.8 0.7 - 4.0 K/uL   Monocytes Relative 7 %   Monocytes Absolute 0.7 0.1 - 1.0 K/uL   Eosinophils Relative 0 %   Eosinophils Absolute 0.0 0.0 - 0.5 K/uL   Basophils Relative 1 %   Basophils Absolute 0.1 0.0 - 0.1 K/uL   Immature Granulocytes 0 %   Abs Immature Granulocytes 0.04 0.00 - 0.07 K/uL    Comment: Performed at [facility], [address], [place], [place] [address]  Basic metabolic panel     Status: Abnormal   Collection Time: 04/21/20  3:23 PM  Result Value  Ref Range   Sodium 138 135 - 145 mmol/L   Potassium 4.3 3.5 - 5.1 mmol/L   Chloride 104 98 - 111 mmol/L   CO2 18 (L) 22 - 32 mmol/L   Glucose, Bld 141 (H) 70 - 99 mg/dL    Comment: Glucose reference range applies only to samples taken after fasting for at least 8 hours.   BUN 7 (L) 8 - 23 mg/dL   Creatinine, Ser 1.611.16 0.61 - 1.24 mg/dL   Calcium 9.5 8.9 - 09.610.3 mg/dL   GFR, Estimated >04>60 >54>60 mL/min    Comment: (NOTE) Calculated using the CKD-EPI Creatinine Equation (2021)    Anion gap 16 (H) 5 - 15    Comment: Performed at [facility], [address] [place], [place] [address]  Urinalysis, Routine w reflex microscopic     Status: Abnormal   Collection Time: 04/21/20  3:23 PM  Result Value Ref Range   Color, Urine YELLOW YELLOW   APPearance HAZY (A) CLEAR   Specific Gravity, Urine 1.011 1.005 - 1.030   pH 5.0 5.0 - 8.0   Glucose, UA NEGATIVE NEGATIVE mg/dL    Hgb urine dipstick SMALL (A) NEGATIVE   Bilirubin Urine NEGATIVE NEGATIVE   Ketones, ur 20 (A) NEGATIVE mg/dL   Protein, ur NEGATIVE NEGATIVE mg/dL   Nitrite NEGATIVE NEGATIVE   Leukocytes,Ua LARGE (A) NEGATIVE   RBC / HPF 0-5 0 - 5 RBC/hpf   WBC, UA 21-50 0 - 5 WBC/hpf   Bacteria, UA RARE (A) NONE SEEN   Squamous Epithelial / LPF 0-5 0 - 5   Mucus PRESENT     Comment: Performed at [facility], [address] [place], [place] [address]   CT Head Wo Contrast  Result Date: 04/21/2020 CLINICAL DATA:  74 year old male with head trauma. EXAM: CT HEAD WITHOUT CONTRAST CT CERVICAL SPINE WITHOUT CONTRAST TECHNIQUE: Multidetector CT imaging of the head and cervical spine was performed following the standard protocol without intravenous contrast. Multiplanar CT image reconstructions of the cervical spine were also generated. COMPARISON:  Cervical spine MRI dated 03/08/2020 and radiograph dated 04/05/2020. FINDINGS: CT HEAD FINDINGS Brain: There is mild age-related atrophy and chronic microvascular ischemic changes. There is no acute intracranial hemorrhage. No mass effect or midline shift. No extra-axial fluid collection. Vascular: No hyperdense vessel or unexpected calcification. Skull: No acute calvarial pathology. Postsurgical changes of the suboccipital decompression. Sinuses/Orbits: No acute finding. Other: None CT CERVICAL SPINE FINDINGS Alignment: No acute subluxation. Skull base and vertebrae: No acute fracture. Postsurgical changes versus incomplete bony fusion of the posterior ring of C1. Soft tissues and spinal canal: No prevertebral fluid or swelling. No visible canal hematoma. Disc levels: Postsurgical changes of C3-C5 ACDF. The fusion hardware is intact. There are multilevel degenerative changes with osteophyte. Upper chest: Negative. Other: None IMPRESSION: 1. No acute intracranial pathology. Mild age-related atrophy and chronic microvascular ischemic changes. 2. No acute/traumatic  cervical spine pathology. 3. Postsurgical changes of C3-C5 ACDF. Electronically Signed   By: Elgie CollardArash  Radparvar M.D.   On: 04/21/2020 18:17   CT Cervical Spine Wo Contrast  Result Date: 04/21/2020 CLINICAL DATA:  74 year old male with head trauma. EXAM: CT HEAD WITHOUT CONTRAST CT CERVICAL SPINE WITHOUT CONTRAST TECHNIQUE: Multidetector CT imaging of the head and cervical spine was performed following the standard protocol without intravenous contrast. Multiplanar CT image reconstructions of the cervical spine were also generated. COMPARISON:  Cervical spine MRI dated 03/08/2020 and radiograph dated 04/05/2020. FINDINGS: CT HEAD FINDINGS  Brain: There is mild age-related atrophy and chronic microvascular ischemic changes. There is no acute intracranial hemorrhage. No mass effect or midline shift. No extra-axial fluid collection. Vascular: No hyperdense vessel or unexpected calcification. Skull: No acute calvarial pathology. Postsurgical changes of the suboccipital decompression. Sinuses/Orbits: No acute finding. Other: None CT CERVICAL SPINE FINDINGS Alignment: No acute subluxation. Skull base and vertebrae: No acute fracture. Postsurgical changes versus incomplete bony fusion of the posterior ring of C1. Soft tissues and spinal canal: No prevertebral fluid or swelling. No visible canal hematoma. Disc levels: Postsurgical changes of C3-C5 ACDF. The fusion hardware is intact. There are multilevel degenerative changes with osteophyte. Upper chest: Negative. Other: None IMPRESSION: 1. No acute intracranial pathology. Mild age-related atrophy and chronic microvascular ischemic changes. 2. No acute/traumatic cervical spine pathology. 3. Postsurgical changes of C3-C5 ACDF. Electronically Signed   By: Elgie Collard M.D.   On: 04/21/2020 18:17   MR Cervical Spine W or Wo Contrast  Result Date: 04/21/2020 CLINICAL DATA:  Spinal cord injury, follow-up. EXAM: MRI CERVICAL SPINE WITHOUT AND WITH CONTRAST TECHNIQUE:  Multiplanar and multiecho pulse sequences of the cervical spine, to include the craniocervical junction and cervicothoracic junction, were obtained without and with intravenous contrast. CONTRAST:  40mL GADAVIST GADOBUTROL 1 MMOL/ML IV SOLN COMPARISON:  04/21/2020 CT cervical spine. 03/08/2020 MRI cervical spine. FINDINGS: Alignment: Straightening of lordosis. Sequela of interval C3-5 ACDF. Grade 1 C2-3 retrolisthesis. Vertebrae: Diffuse bone marrow heterogeneity. No fracture or focal osseous lesion. Cord: Severe cord atrophy and T2 hyperintensity spanning the C2-T1 levels is unchanged. No abnormal cord enhancement. Posterior Fossa, vertebral arteries: No acute finding. Disc levels: Interbody spacers at the C3-5 levels. Multilevel desiccation and disc space loss at the remaining cervical levels. C2-3: Grade 1 retrolisthesis. Disc osteophyte complex partially effacing the ventral CSF containing spaces. Uncovertebral and facet hypertrophy. Patent spinal canal. Moderate to severe bilateral neural foraminal narrowing. C3-4: Sequela of fusion. Moderate to severe spinal canal narrowing. Mild-to-moderate bilateral neural foraminal narrowing. C4-5: Sequela of fusion. Mild spinal canal narrowing. Moderate left and mild right neural foraminal narrowing. C5-6: Disc osteophyte complex with prominent left paracentral osteophyte abutting the ventral cord. Uncovertebral and facet hypertrophy. Mild spinal canal and right neural foraminal narrowing. Severe left neural foraminal narrowing. C6-7: Disc osteophyte complex with uncovertebral and facet hypertrophy. Dorsal osseous prominence partially effacing the ventral CSF containing spaces. Mild spinal canal and moderate to severe bilateral neural foraminal narrowing. C7-T1: Disc osteophyte complex with right predominant uncovertebral and facet hypertrophy. Central protrusion partially effacing the ventral CSF containing spaces. Bilateral facet hypertrophy. Mild spinal canal, severe  right and mild-to-moderate left neural foraminal narrowing. Paraspinal tissues: Postsurgical appearance of the paraspinal soft tissues. No focal fluid collection. IMPRESSION: Sequela of C3-5 ACDF.  No acute fracture. Advanced multilevel spondylosis. Moderate to severe spinal canal narrowing most prominent at the C3-4 level is increased since prior exam. Cord atrophy and myelomalacia, unchanged. No focal cord edema or fluid collection. Electronically Signed   By: Stana Bunting M.D.   On: 04/21/2020 21:37    Review of Systems  Unable to perform ROS: Dementia    Blood pressure 119/69, pulse (!) 111, temperature 98.6 F (37 C), resp. rate 19, SpO2 95 %. Physical Exam HENT:     Head: Normocephalic.     Nose: Nose normal.  Eyes:     Pupils: Pupils are equal, round, and reactive to light.  Cardiovascular:     Rate and Rhythm: Normal rate.  Pulmonary:     Effort:  Pulmonary effort is normal.  Abdominal:     General: Abdomen is flat.  Musculoskeletal:        General: Normal range of motion.  Skin:    General: Skin is warm.  Neurological:     Mental Status: He is alert.     Comments: Dense quadriparesis has 1-2 out of 5 grip strength left and right hand has toe flexors bilaterally has 0-1 bicep tricep bilaterally and 0-1 iliopsoas quads hamstrings      Assessment/Plan 74 year old acute onset dense quadriparesis MRI scan CT scan looks like more than likely epidural hematoma behind the C4-5 interbody implant we have recommended reexploration of fusion removal of hardware removal of the 4 5 implant evacuation hematoma in room revision.  I explained this to the patient as well as his daughter and they have agreed to proceed forward.  Gone over perioperative course expectations of outcome alternatives to surgery.  Mariam Dollar, MD 04/21/2020, 11:33 PM

## 2020-04-22 ENCOUNTER — Encounter (HOSPITAL_COMMUNITY): Payer: Self-pay | Admitting: Neurosurgery

## 2020-04-22 ENCOUNTER — Inpatient Hospital Stay (HOSPITAL_COMMUNITY): Payer: Medicare Other | Admitting: Anesthesiology

## 2020-04-22 ENCOUNTER — Encounter (HOSPITAL_COMMUNITY)
Admission: EM | Disposition: A | Discharge status: Skilled Nursing Facility | Payer: Self-pay | Source: Home / Self Care | Attending: Neurosurgery

## 2020-04-22 ENCOUNTER — Inpatient Hospital Stay (HOSPITAL_COMMUNITY): Payer: Medicare Other

## 2020-04-22 ENCOUNTER — Other Ambulatory Visit: Payer: Self-pay

## 2020-04-22 DIAGNOSIS — R531 Weakness: Secondary | ICD-10-CM | POA: Diagnosis present

## 2020-04-22 DIAGNOSIS — Z8249 Family history of ischemic heart disease and other diseases of the circulatory system: Secondary | ICD-10-CM | POA: Diagnosis not present

## 2020-04-22 DIAGNOSIS — Z79899 Other long term (current) drug therapy: Secondary | ICD-10-CM | POA: Diagnosis not present

## 2020-04-22 DIAGNOSIS — G935 Compression of brain: Secondary | ICD-10-CM | POA: Diagnosis present

## 2020-04-22 DIAGNOSIS — S129XXA Fracture of neck, unspecified, initial encounter: Secondary | ICD-10-CM | POA: Diagnosis present

## 2020-04-22 DIAGNOSIS — Z87891 Personal history of nicotine dependence: Secondary | ICD-10-CM | POA: Diagnosis not present

## 2020-04-22 DIAGNOSIS — M4802 Spinal stenosis, cervical region: Secondary | ICD-10-CM | POA: Diagnosis present

## 2020-04-22 DIAGNOSIS — S14125A Central cord syndrome at C5 level of cervical spinal cord, initial encounter: Secondary | ICD-10-CM | POA: Diagnosis present

## 2020-04-22 DIAGNOSIS — J45909 Unspecified asthma, uncomplicated: Secondary | ICD-10-CM | POA: Diagnosis present

## 2020-04-22 DIAGNOSIS — Z7901 Long term (current) use of anticoagulants: Secondary | ICD-10-CM | POA: Diagnosis not present

## 2020-04-22 DIAGNOSIS — M19042 Primary osteoarthritis, left hand: Secondary | ICD-10-CM | POA: Diagnosis present

## 2020-04-22 DIAGNOSIS — W19XXXA Unspecified fall, initial encounter: Secondary | ICD-10-CM | POA: Diagnosis present

## 2020-04-22 DIAGNOSIS — S14104A Unspecified injury at C4 level of cervical spinal cord, initial encounter: Secondary | ICD-10-CM | POA: Diagnosis present

## 2020-04-22 DIAGNOSIS — Z981 Arthrodesis status: Secondary | ICD-10-CM | POA: Diagnosis not present

## 2020-04-22 DIAGNOSIS — Z20822 Contact with and (suspected) exposure to covid-19: Secondary | ICD-10-CM | POA: Diagnosis present

## 2020-04-22 DIAGNOSIS — M19041 Primary osteoarthritis, right hand: Secondary | ICD-10-CM | POA: Diagnosis present

## 2020-04-22 DIAGNOSIS — F0390 Unspecified dementia without behavioral disturbance: Secondary | ICD-10-CM | POA: Diagnosis present

## 2020-04-22 DIAGNOSIS — G8250 Quadriplegia, unspecified: Secondary | ICD-10-CM | POA: Diagnosis present

## 2020-04-22 DIAGNOSIS — Z880 Allergy status to penicillin: Secondary | ICD-10-CM | POA: Diagnosis not present

## 2020-04-22 DIAGNOSIS — G959 Disease of spinal cord, unspecified: Secondary | ICD-10-CM | POA: Diagnosis present

## 2020-04-22 DIAGNOSIS — Z888 Allergy status to other drugs, medicaments and biological substances status: Secondary | ICD-10-CM | POA: Diagnosis not present

## 2020-04-22 DIAGNOSIS — T84226A Displacement of internal fixation device of vertebrae, initial encounter: Secondary | ICD-10-CM | POA: Diagnosis present

## 2020-04-22 DIAGNOSIS — I10 Essential (primary) hypertension: Secondary | ICD-10-CM | POA: Diagnosis present

## 2020-04-22 HISTORY — PX: ANTERIOR CERVICAL DECOMP/DISCECTOMY FUSION: SHX1161

## 2020-04-22 LAB — MRSA PCR SCREENING: MRSA by PCR: NEGATIVE

## 2020-04-22 LAB — RESPIRATORY PANEL BY RT PCR (FLU A&B, COVID)
Influenza A by PCR: NEGATIVE
Influenza B by PCR: NEGATIVE
SARS Coronavirus 2 by RT PCR: NEGATIVE

## 2020-04-22 IMAGING — CR DG CERVICAL SPINE 1V
1 series · 1 of 1 positions shown · non-contrast
Comparison: [DATE]

CLINICAL DATA: Epidural hematoma, hardware revision

EXAM:
DG CERVICAL SPINE - 1 VIEW

[xtable]
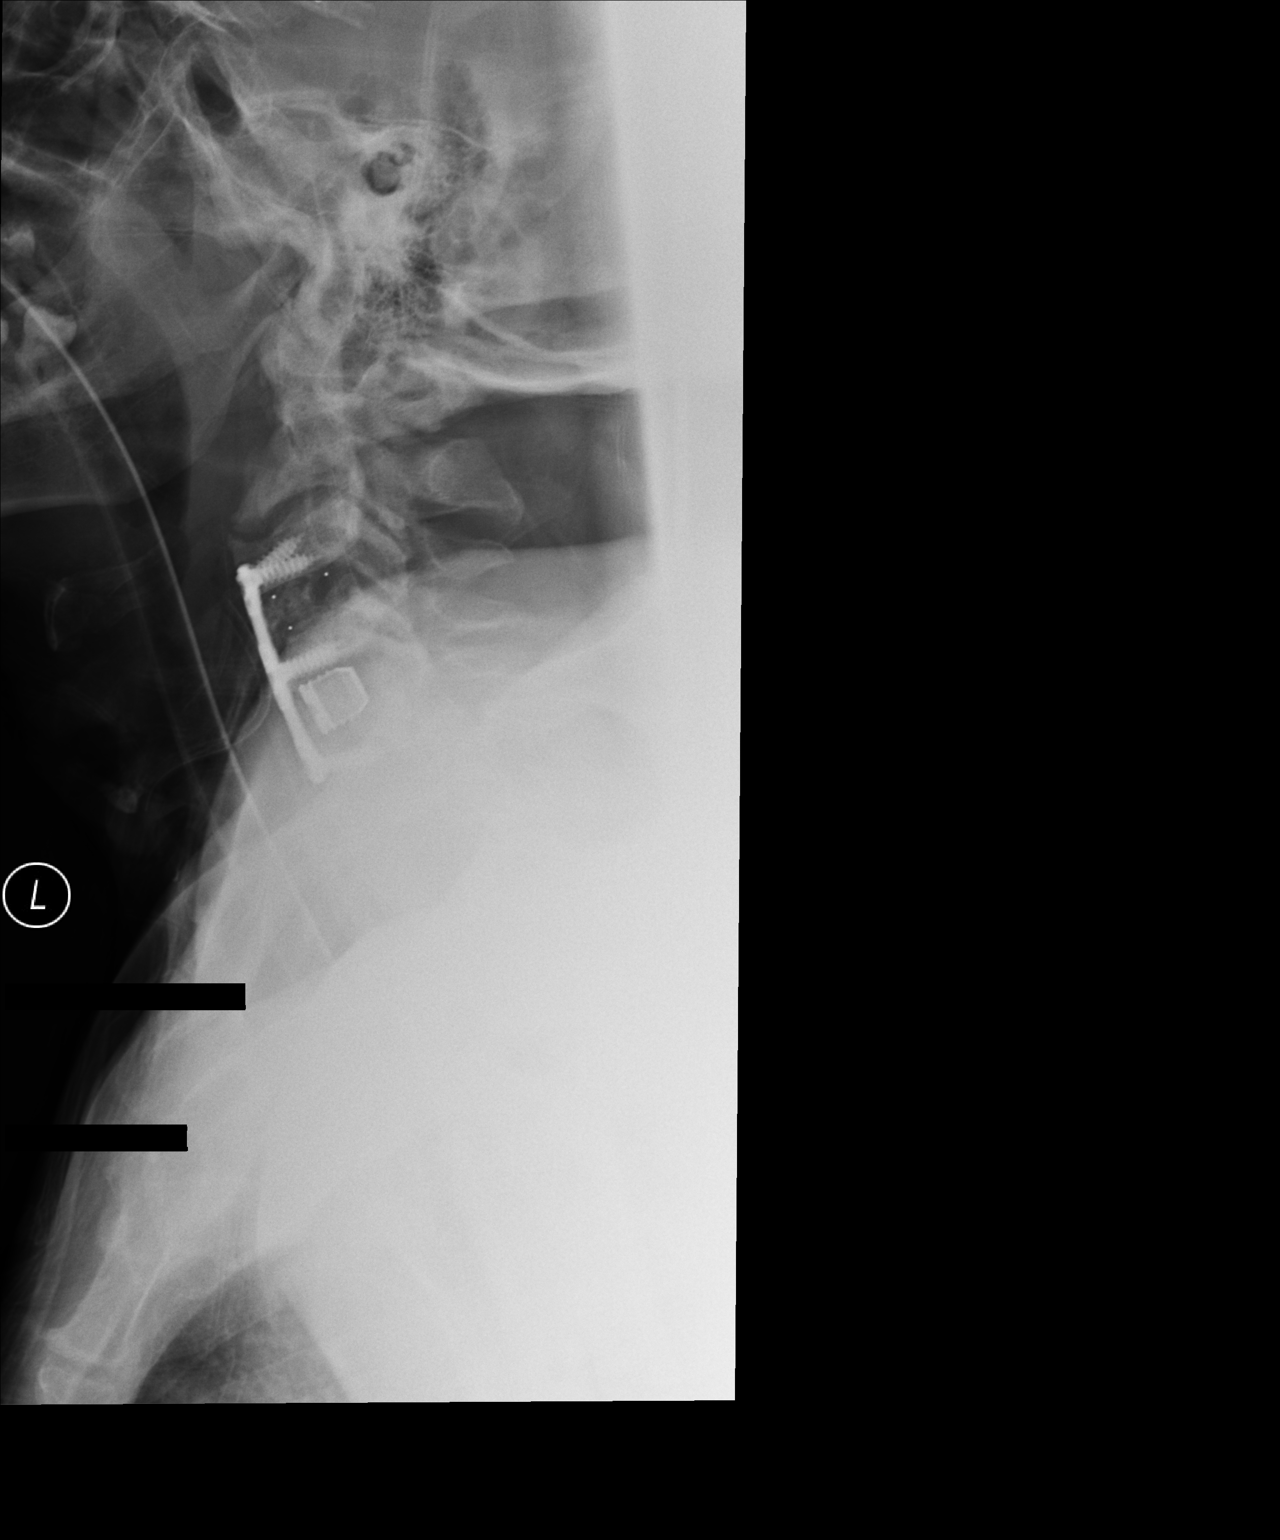

[1 of 1 positions shown; findings below may reference images not displayed]

FINDINGS: Lateral intraoperative view again demonstrates anterior fusion at
C3-C5 with plate screw fixation. The C4-C5 implant has been
replaced.
IMPRESSION: Intraoperative view of anterior fusion. C4-C5 interbody implant has
been replaced.

## 2020-04-22 SURGERY — ANTERIOR CERVICAL DECOMPRESSION/DISCECTOMY FUSION 1 LEVEL/HARDWARE REMOVAL
Anesthesia: General | Site: Neck

## 2020-04-22 MED ORDER — DEXAMETHASONE SODIUM PHOSPHATE 10 MG/ML IJ SOLN
INTRAMUSCULAR | Status: DC | PRN
  Administered 2020-04-22: 10 mg via INTRAVENOUS

## 2020-04-22 MED ORDER — MEPERIDINE HCL 25 MG/ML IJ SOLN: 6.2500 mg | INTRAMUSCULAR | Status: DC | PRN

## 2020-04-22 MED ORDER — ACETAMINOPHEN 650 MG RE SUPP: 650.0000 mg | RECTAL | Status: DC | PRN

## 2020-04-22 MED ORDER — ROCURONIUM BROMIDE 100 MG/10ML IV SOLN
INTRAVENOUS | Status: DC | PRN
  Administered 2020-04-22: 50 mg via INTRAVENOUS

## 2020-04-22 MED ORDER — SODIUM CHLORIDE 0.9% FLUSH: 3.0000 mL | INTRAVENOUS | Status: DC | PRN

## 2020-04-22 MED ORDER — 0.9 % SODIUM CHLORIDE (POUR BTL) OPTIME
TOPICAL | Status: DC | PRN
  Administered 2020-04-22: 1000 mL

## 2020-04-22 MED ORDER — SODIUM CHLORIDE 0.9 % IV SOLN: 250.0000 mL | INTRAVENOUS | Status: DC

## 2020-04-22 MED ORDER — PANTOPRAZOLE SODIUM 40 MG IV SOLR: 40.0000 mg | Freq: Every day | INTRAVENOUS | Status: DC

## 2020-04-22 MED ORDER — PANTOPRAZOLE SODIUM 40 MG PO TBEC: 40.0000 mg | DELAYED_RELEASE_TABLET | Freq: Every day | ORAL | Status: DC

## 2020-04-22 MED ORDER — PROPOFOL 10 MG/ML IV BOLUS
INTRAVENOUS | Status: DC | PRN
  Administered 2020-04-22: 120 mg via INTRAVENOUS

## 2020-04-22 MED ORDER — PROPOFOL 10 MG/ML IV BOLUS
INTRAVENOUS | Status: AC
  Filled 2020-04-22: qty 20

## 2020-04-22 MED ORDER — ACETAMINOPHEN 325 MG PO TABS
650.0000 mg | ORAL_TABLET | ORAL | Status: DC | PRN
  Administered 2020-04-27 – 2020-04-28 (×2): 650 mg via ORAL
  Filled 2020-04-22 (×2): qty 2

## 2020-04-22 MED ORDER — THROMBIN 5000 UNITS EX SOLR
CUTANEOUS | Status: AC
  Filled 2020-04-22: qty 5000

## 2020-04-22 MED ORDER — FENTANYL CITRATE (PF) 250 MCG/5ML IJ SOLN
INTRAMUSCULAR | Status: AC
  Filled 2020-04-22: qty 5

## 2020-04-22 MED ORDER — THROMBIN 20000 UNITS EX SOLR
OROMUCOSAL | Status: DC | PRN
  Administered 2020-04-22: 20 mL via TOPICAL

## 2020-04-22 MED ORDER — DEXAMETHASONE SODIUM PHOSPHATE 10 MG/ML IJ SOLN
10.0000 mg | Freq: Four times a day (QID) | INTRAMUSCULAR | Status: DC
  Administered 2020-04-22 – 2020-04-28 (×25): 10 mg via INTRAVENOUS
  Filled 2020-04-22 (×26): qty 1

## 2020-04-22 MED ORDER — ONDANSETRON HCL 4 MG/2ML IJ SOLN: 4.0000 mg | Freq: Four times a day (QID) | INTRAMUSCULAR | Status: DC | PRN

## 2020-04-22 MED ORDER — VANCOMYCIN HCL IN DEXTROSE 1-5 GM/200ML-% IV SOLN
1000.0000 mg | Freq: Two times a day (BID) | INTRAVENOUS | Status: DC
  Administered 2020-04-22 – 2020-04-24 (×5): 1000 mg via INTRAVENOUS
  Filled 2020-04-22 (×4): qty 200

## 2020-04-22 MED ORDER — CEFAZOLIN SODIUM 1 G IJ SOLR
INTRAMUSCULAR | Status: AC
  Filled 2020-04-22: qty 30

## 2020-04-22 MED ORDER — CHLORHEXIDINE GLUCONATE CLOTH 2 % EX PADS
6.0000 | MEDICATED_PAD | Freq: Every day | CUTANEOUS | Status: DC
  Administered 2020-04-22 – 2020-04-28 (×7): 6 via TOPICAL

## 2020-04-22 MED ORDER — PHENYLEPHRINE HCL-NACL 10-0.9 MG/250ML-% IV SOLN
INTRAVENOUS | Status: DC | PRN
  Administered 2020-04-22: 50 ug/min via INTRAVENOUS

## 2020-04-22 MED ORDER — ONDANSETRON HCL 4 MG/2ML IJ SOLN: 4.0000 mg | Freq: Once | INTRAMUSCULAR | Status: DC | PRN

## 2020-04-22 MED ORDER — SUCCINYLCHOLINE CHLORIDE 200 MG/10ML IV SOSY
PREFILLED_SYRINGE | INTRAVENOUS | Status: AC
  Filled 2020-04-22: qty 10

## 2020-04-22 MED ORDER — ALUM & MAG HYDROXIDE-SIMETH 200-200-20 MG/5ML PO SUSP: 30.0000 mL | Freq: Four times a day (QID) | ORAL | Status: DC | PRN

## 2020-04-22 MED ORDER — PHENYLEPHRINE HCL (PRESSORS) 10 MG/ML IV SOLN
INTRAVENOUS | Status: DC | PRN
  Administered 2020-04-22: 80 ug via INTRAVENOUS

## 2020-04-22 MED ORDER — MIDAZOLAM HCL 2 MG/2ML IJ SOLN
INTRAMUSCULAR | Status: AC
  Filled 2020-04-22: qty 2

## 2020-04-22 MED ORDER — CYCLOBENZAPRINE HCL 10 MG PO TABS: 10.0000 mg | ORAL_TABLET | Freq: Three times a day (TID) | ORAL | Status: DC | PRN

## 2020-04-22 MED ORDER — DOCUSATE SODIUM 100 MG PO CAPS
100.0000 mg | ORAL_CAPSULE | Freq: Two times a day (BID) | ORAL | Status: DC
  Administered 2020-04-22 – 2020-04-28 (×12): 100 mg via ORAL
  Filled 2020-04-22 (×14): qty 1

## 2020-04-22 MED ORDER — VANCOMYCIN HCL IN DEXTROSE 1-5 GM/200ML-% IV SOLN
INTRAVENOUS | Status: AC
  Filled 2020-04-22: qty 200

## 2020-04-22 MED ORDER — ONDANSETRON HCL 4 MG/2ML IJ SOLN
INTRAMUSCULAR | Status: DC | PRN
  Administered 2020-04-22: 4 mg via INTRAVENOUS

## 2020-04-22 MED ORDER — THROMBIN (RECOMBINANT) 5000 UNITS EX SOLR
CUTANEOUS | Status: AC
  Filled 2020-04-22: qty 10000

## 2020-04-22 MED ORDER — OXYCODONE HCL 5 MG PO TABS
10.0000 mg | ORAL_TABLET | ORAL | Status: DC | PRN
  Administered 2020-04-28: 10 mg via ORAL
  Filled 2020-04-22 (×2): qty 2

## 2020-04-22 MED ORDER — FENTANYL CITRATE (PF) 250 MCG/5ML IJ SOLN
INTRAMUSCULAR | Status: DC | PRN
  Administered 2020-04-22 (×2): 50 ug via INTRAVENOUS
  Administered 2020-04-22: 100 ug via INTRAVENOUS
  Administered 2020-04-22: 50 ug via INTRAVENOUS

## 2020-04-22 MED ORDER — AMLODIPINE BESYLATE 10 MG PO TABS
10.0000 mg | ORAL_TABLET | Freq: Every day | ORAL | Status: DC
  Administered 2020-04-22 – 2020-04-28 (×7): 10 mg via ORAL
  Filled 2020-04-22 (×7): qty 1

## 2020-04-22 MED ORDER — VANCOMYCIN HCL 1000 MG IV SOLR
INTRAVENOUS | Status: DC | PRN
  Administered 2020-04-22: 1000 mg via INTRAVENOUS

## 2020-04-22 MED ORDER — HYDROMORPHONE HCL 1 MG/ML IJ SOLN: 0.5000 mg | INTRAMUSCULAR | Status: DC | PRN

## 2020-04-22 MED ORDER — SUCCINYLCHOLINE CHLORIDE 20 MG/ML IJ SOLN
INTRAMUSCULAR | Status: DC | PRN
  Administered 2020-04-22: 100 mg via INTRAVENOUS

## 2020-04-22 MED ORDER — PHENYLEPHRINE 40 MCG/ML (10ML) SYRINGE FOR IV PUSH (FOR BLOOD PRESSURE SUPPORT)
PREFILLED_SYRINGE | INTRAVENOUS | Status: AC
  Filled 2020-04-22: qty 10

## 2020-04-22 MED ORDER — SODIUM CHLORIDE 0.9 % IV SOLN: INTRAVENOUS | Status: DC | PRN

## 2020-04-22 MED ORDER — WHITE PETROLATUM EX OINT
TOPICAL_OINTMENT | CUTANEOUS | Status: AC
  Administered 2020-04-22: 0.2
  Filled 2020-04-22: qty 28.35

## 2020-04-22 MED ORDER — PANTOPRAZOLE SODIUM 40 MG PO TBEC
40.0000 mg | DELAYED_RELEASE_TABLET | Freq: Two times a day (BID) | ORAL | Status: DC
  Administered 2020-04-22 – 2020-04-28 (×13): 40 mg via ORAL
  Filled 2020-04-22 (×13): qty 1

## 2020-04-22 MED ORDER — THROMBIN 20000 UNITS EX SOLR
CUTANEOUS | Status: AC
  Filled 2020-04-22: qty 20000

## 2020-04-22 MED ORDER — PHENOL 1.4 % MT LIQD: 1.0000 | OROMUCOSAL | Status: DC | PRN

## 2020-04-22 MED ORDER — LIDOCAINE 2% (20 MG/ML) 5 ML SYRINGE
INTRAMUSCULAR | Status: AC
  Filled 2020-04-22: qty 5

## 2020-04-22 MED ORDER — HYDROMORPHONE HCL 1 MG/ML IJ SOLN: 0.2500 mg | INTRAMUSCULAR | Status: DC | PRN

## 2020-04-22 MED ORDER — LIDOCAINE HCL (CARDIAC) PF 100 MG/5ML IV SOSY
PREFILLED_SYRINGE | INTRAVENOUS | Status: DC | PRN
  Administered 2020-04-22: 100 mg via INTRATRACHEAL

## 2020-04-22 MED ORDER — ONDANSETRON HCL 4 MG PO TABS: 4.0000 mg | ORAL_TABLET | Freq: Four times a day (QID) | ORAL | Status: DC | PRN

## 2020-04-22 MED ORDER — MENTHOL 3 MG MT LOZG: 1.0000 | LOZENGE | OROMUCOSAL | Status: DC | PRN

## 2020-04-22 MED ORDER — SODIUM CHLORIDE 0.9% FLUSH
3.0000 mL | Freq: Two times a day (BID) | INTRAVENOUS | Status: DC
  Administered 2020-04-22 – 2020-04-28 (×12): 3 mL via INTRAVENOUS

## 2020-04-22 MED ORDER — THROMBIN 5000 UNITS EX SOLR
OROMUCOSAL | Status: DC | PRN
  Administered 2020-04-22: 5 mL via TOPICAL

## 2020-04-22 MED ORDER — DEXAMETHASONE SODIUM PHOSPHATE 10 MG/ML IJ SOLN
INTRAMUSCULAR | Status: AC
  Filled 2020-04-22: qty 2

## 2020-04-22 SURGICAL SUPPLY — 67 items
ADH SKN CLS APL DERMABOND .7 (GAUZE/BANDAGES/DRESSINGS) ×1
APL SKNCLS STERI-STRIP NONHPOA (GAUZE/BANDAGES/DRESSINGS) ×1
BAND INSRT 18 STRL LF DISP RB (MISCELLANEOUS) ×2
BAND RUBBER #18 3X1/16 STRL (MISCELLANEOUS) ×4 IMPLANT
BASKET BONE COLLECTION (BASKET) ×2 IMPLANT
BENZOIN TINCTURE PRP APPL 2/3 (GAUZE/BANDAGES/DRESSINGS) ×2 IMPLANT
BIT DRILL NEURO 2X3.1 SFT TUCH (MISCELLANEOUS) ×1 IMPLANT
BONE VIVIGEN FORMABLE 1.3CC (Bone Implant) ×2 IMPLANT
BUR MATCHSTICK NEURO 3.0 LAGG (BURR) ×2 IMPLANT
CANISTER SUCT 3000ML PPV (MISCELLANEOUS) ×2 IMPLANT
CARTRIDGE OIL MAESTRO DRILL (MISCELLANEOUS) ×1 IMPLANT
CLSR STERI-STRIP ANTIMIC 1/2X4 (GAUZE/BANDAGES/DRESSINGS) ×1 IMPLANT
COVER WAND RF STERILE (DRAPES) ×2 IMPLANT
DECANTER SPIKE VIAL GLASS SM (MISCELLANEOUS) ×2 IMPLANT
DERMABOND ADVANCED (GAUZE/BANDAGES/DRESSINGS) ×1
DERMABOND ADVANCED .7 DNX12 (GAUZE/BANDAGES/DRESSINGS) IMPLANT
DIFFUSER DRILL AIR PNEUMATIC (MISCELLANEOUS) ×2 IMPLANT
DRAPE C-ARM 42X72 X-RAY (DRAPES) ×4 IMPLANT
DRAPE LAPAROTOMY 100X72 PEDS (DRAPES) ×2 IMPLANT
DRAPE MICROSCOPE LEICA (MISCELLANEOUS) ×2 IMPLANT
DRILL NEURO 2X3.1 SOFT TOUCH (MISCELLANEOUS) ×2
DRSG OPSITE POSTOP 4X6 (GAUZE/BANDAGES/DRESSINGS) ×1 IMPLANT
DURAPREP 6ML APPLICATOR 50/CS (WOUND CARE) ×2 IMPLANT
ELECT COATED BLADE 2.86 ST (ELECTRODE) ×2 IMPLANT
ELECT REM PT RETURN 9FT ADLT (ELECTROSURGICAL) ×2
ELECTRODE REM PT RTRN 9FT ADLT (ELECTROSURGICAL) ×1 IMPLANT
GAUZE 4X4 16PLY RFD (DISPOSABLE) IMPLANT
GAUZE SPONGE 4X4 12PLY STRL (GAUZE/BANDAGES/DRESSINGS) ×2 IMPLANT
GLOVE BIO SURGEON STRL SZ7 (GLOVE) ×1 IMPLANT
GLOVE BIO SURGEON STRL SZ8 (GLOVE) ×2 IMPLANT
GLOVE BIOGEL PI IND STRL 7.0 (GLOVE) IMPLANT
GLOVE BIOGEL PI IND STRL 7.5 (GLOVE) IMPLANT
GLOVE BIOGEL PI INDICATOR 7.0 (GLOVE)
GLOVE BIOGEL PI INDICATOR 7.5 (GLOVE) ×1
GLOVE ECLIPSE 8.5 STRL (GLOVE) ×1 IMPLANT
GLOVE EXAM NITRILE XL STR (GLOVE) IMPLANT
GLOVE INDICATOR 8.5 STRL (GLOVE) ×2 IMPLANT
GOWN STRL REUS W/ TWL LRG LVL3 (GOWN DISPOSABLE) ×1 IMPLANT
GOWN STRL REUS W/ TWL XL LVL3 (GOWN DISPOSABLE) ×1 IMPLANT
GOWN STRL REUS W/TWL 2XL LVL3 (GOWN DISPOSABLE) ×2 IMPLANT
GOWN STRL REUS W/TWL LRG LVL3 (GOWN DISPOSABLE) ×2
GOWN STRL REUS W/TWL XL LVL3 (GOWN DISPOSABLE) ×2
GRAFT BNE MATRIX VG FRMBL SM 1 (Bone Implant) IMPLANT
HALTER HD/CHIN CERV TRACTION D (MISCELLANEOUS) ×2 IMPLANT
HEMOSTAT POWDER KIT SURGIFOAM (HEMOSTASIS) ×2 IMPLANT
KIT BASIN OR (CUSTOM PROCEDURE TRAY) ×2 IMPLANT
KIT TURNOVER KIT B (KITS) ×2 IMPLANT
NDL HYPO 18GX1.5 BLUNT FILL (NEEDLE) ×1 IMPLANT
NDL SPNL 20GX3.5 QUINCKE YW (NEEDLE) ×1 IMPLANT
NEEDLE HYPO 18GX1.5 BLUNT FILL (NEEDLE) ×2 IMPLANT
NEEDLE SPNL 20GX3.5 QUINCKE YW (NEEDLE) ×2 IMPLANT
NS IRRIG 1000ML POUR BTL (IV SOLUTION) ×2 IMPLANT
OIL CARTRIDGE MAESTRO DRILL (MISCELLANEOUS) ×2
PACK LAMINECTOMY NEURO (CUSTOM PROCEDURE TRAY) ×2 IMPLANT
PAD ARMBOARD 7.5X6 YLW CONV (MISCELLANEOUS) ×6 IMPLANT
PIN DISTRACTION 14MM (PIN) IMPLANT
SCREW XTEND SELF DRILL 4.6X16 (Screw) ×6 IMPLANT
SPACER HEDRON C 12X14X11 0D (Spacer) ×1 IMPLANT
SPONGE INTESTINAL PEANUT (DISPOSABLE) ×2 IMPLANT
SPONGE SURGIFOAM ABS GEL SZ50 (HEMOSTASIS) ×2 IMPLANT
STRIP CLOSURE SKIN 1/2X4 (GAUZE/BANDAGES/DRESSINGS) ×2 IMPLANT
SUT VIC AB 3-0 SH 8-18 (SUTURE) ×2 IMPLANT
SUT VICRYL 4-0 PS2 18IN ABS (SUTURE) ×2 IMPLANT
TAPE CLOTH 4X10 WHT NS (GAUZE/BANDAGES/DRESSINGS) IMPLANT
TOWEL GREEN STERILE (TOWEL DISPOSABLE) ×2 IMPLANT
TOWEL GREEN STERILE FF (TOWEL DISPOSABLE) ×2 IMPLANT
WATER STERILE IRR 1000ML POUR (IV SOLUTION) ×2 IMPLANT

## 2020-04-22 NOTE — Progress Notes (Signed)
Pharmacy Antibiotic Note  Shannon [name] is a 74 y.o. male admitted on 04/21/2020 with cervical myelopathy and progressive quadriparesis s/p fusion hardware removal, evacuation epidural hematoma.  Pharmacy has been consulted for vancomycin dosing - hemovac drain in place. Vancomycin 1000 mg IV pre-op given ~05:30. SCr 1.16 on 10/26 and around baseline. Surgical PCR from 10/12 was (+) Staph aureus and (-) MRSA.  Plan: Vancomycin 1000 mg IV every 12 hours.  Goal trough 10-15 mcg/mL.  Monitor renal function, clinical progress F/U LOT post-op Vancomycin levels as clinically indicated  Height: 5\' 10"  (177.8 cm) Weight: 91 kg (200 lb 9.9 oz) IBW/kg (Calculated) : 73  Temp (24hrs), Avg:98.4 F (36.9 C), Min:98.1 F (36.7 C), Max:98.6 F (37 C)  Recent Labs  Lab 04/18/20 1001 04/21/20 1523  WBC 6.1 10.3  CREATININE 1.08 1.16    Estimated Creatinine Clearance: 63.4 mL/min (by C-G formula based on SCr of 1.16 mg/dL).    Allergies  Allergen Reactions  . Iodine Swelling  . Penicillins Swelling    Facial swelling, itchy throat     Thank you for involving pharmacy in this patient's care.  04/23/20, PharmD, BCPS Clinical Pharmacist Clinical phone for 04/22/2020 until 3p is 04/24/2020 04/22/2020 10:03 AM  **Pharmacist phone directory can be found on [email or web address] listed under [facility]**

## 2020-04-22 NOTE — Op Note (Signed)
Preoperative diagnosis: Cervical myelopathy and progressive quadriparesis status post anterior cervical discectomies and fusion at C3-4 and C4-5  Postoperative diagnosis: Same from epidural hematoma behind the interbody implant at C4-5  Procedure reexploration of anterior cervical wound with removal of plate hardware and removal of C4-5 cage evacuation of epidural hematoma at C4-5 then placement of new globus Hebron titanium cage packed with vision and locally harvested autograft and then replacement of globus plate utilizing six 50mm variable angle screws  Surgeon: Jillyn Hidden Aracelly Tencza  Assistant: Julien Girt  Anesthesia: General  EBL: Minimal  HPI: 74 year old gentleman who presented the ER with inability to move his arms and legs unclear how long this been going on or how rapidly it progressed patient had undergone a two-level previous anterior cervical about 2 weeks ago.  Work-up revealed what looks like hematoma behind the implant at C4-5 with increased stenosis.  Due to patient progression of clinical syndrome imaging findings failed conservative treatment we recommended reexploration of anterior cervical removal of hardware removal of C4-5 implant and evacuation hematoma.  I extensively went over the risks with the patient as well as his daughter as well as perioperative course expectations of outcome and alternatives of surgery and they understood and agreed to proceed forward.  Operative procedure: Patient brought into the OR was due to general anesthesia positioned supine the neck in slight extension 5 pounds halter traction his old incision was identified neck was prepped and draped in routine sterile fashion his old incision was opened up and working through the scar tissue a plane was developed down to the plate removed the screws they were noted to be loose and the plate was loose I did remove the 4 5 implant which was also noted to be loose and there was a large epidural hematoma behind the  implant.  Evacuate all the hematoma identified the C5 nerve roots bilaterally took a nerve hook and removed some additional hematoma from behind the C4 vertebral body and behind the C5 vertebral body meticulous hemostasis was maintained at this point I selected a bigger cage I went from a 10 mm to an 11 mm Hebron titanium cage mixed it with vivigen and some of the remainder of the graft had been packed in the first cage.  I then utilized the same plate but placed screws that were longer I placed 35mm variable angle screws to try to get bicortical purchase.  All screws seem to have excellent purchase lock mechanisms were engaged then meticulous hemostasis was maintained I did place a drain and closed the wound in layers with Vicryl running 4 subcuticular Dermabond benzoin Steri-Strips and a sterile dressing was applied patient recovery in stable condition.  At the end the case all needle count sponge counts were correct.

## 2020-04-22 NOTE — Evaluation (Signed)
Physical Therapy Evaluation Patient Details Name: Shannon [name] MRN: [id number] DOB: 1945-08-04 Today's Date: 04/22/2020   History of Present Illness  74 y.o. 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  Pt in hard collar post op.  Pt with significant PMH of admission to hospital for cervical fusion from 10/13-10/15/21 and then d/c to inpatient rehab from 10/15-10/27/21.  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    Clinical Impression  Pt with significant weakness in all 4 extremities as well as sensation changes.  He is unable to lift either leg against gravity and is unable to localize to LT, especially in his feet.  He refused to let me reposition him from his current R side lying.  He will need significant help to mobilize EOB and will likely need a lift for OOB to chair or WC.  He will need significant post acute therapy.   PT to follow acutely for deficits listed below.    Follow Up Recommendations SNF    Equipment Recommendations  Wheelchair (measurements PT);Wheelchair cushion (measurements PT);Hospital bed;Other (comment) (hoyer lift)    Recommendations for Other Services       Precautions / Restrictions Precautions Precautions: Fall;Cervical Required Braces or Orthoses: Cervical Brace Cervical Brace: Hard collar;At all times      Mobility  Bed Mobility               General bed mobility comments: Pt refused repositioning in bed despite his reports he thinks he has been on his right side over 2 hours.  I educated re: the importance of turning for pressure relief.  "My bottom doesn't hurt".     Transfers                 General transfer comment: It will take at least two people to mobilize to EOB and he will likely be initial  lift OOB to chair or WC.   Ambulation/Gait                Stairs            Wheelchair Mobility    Modified Rankin (Stroke Patients Only)       Balance                                             Pertinent Vitals/Pain Pain Assessment: No/denies pain    Home Living Family/patient expects to be discharged to:: Private residence Living Arrangements: Alone Available Help at Discharge: Family;Available 24 hours/day (daughter was living with him post CIR) Type of Home: House Home Access: Stairs to enter Entrance Stairs-Rails: Right Entrance Stairs-Number of Steps: 5-6 Home Layout: One level Home Equipment: Walker - 4 wheels      Prior Function Level of Independence: Needs assistance   Gait / Transfers Assistance Needed: per rehab d/c notes he was walking with a 4 wheeled RW 200', not sure how he was doing at home, but had two falls since returning home (per RN report)  ADL's / Homemaking Assistance Needed: had an aide ordered after rehab.         Hand Dominance   Dominant Hand: Right    Extremity/Trunk Assessment  Upper Extremity Assessment Upper Extremity Assessment: Defer to OT evaluation    Lower Extremity Assessment Lower Extremity Assessment: RLE deficits/detail;LLE deficits/detail RLE Deficits / Details: bil LE with deminised sensation, worst distally to LT than proximally, trace ankle DF, knee flexion ext, hip flexion bil.  Seems similar Left to right from bed level assessment.  RLE Sensation: decreased light touch LLE Deficits / Details: bil LE with deminised sensation, worst distally to LT than proximally, trace ankle DF, knee flexion ext, hip flexion bil.  Seems similar Left to right from bed level assessment.  LLE Sensation: decreased light touch    Cervical / Trunk Assessment Cervical / Trunk Exceptions: s/p cervical surgery x2  Communication   Communication: No difficulties  Cognition Arousal/Alertness: Lethargic  (eyes closed throughout, but conversant) Behavior During Therapy: WFL for tasks assessed/performed Overall Cognitive Status: No family/caregiver present to determine baseline cognitive functioning Area of Impairment: Safety/judgement;Problem solving                         Safety/Judgement: Decreased awareness of safety;Decreased awareness of deficits   Problem Solving:  (difficulty with discussions IO:MBTDHR and strength) General Comments: Pt generally able to report his situation, home set up, living with daughter, etc.  When his food tray came in he started talking about how he cannot eat because he cannot exercise.  If he eats and doesn't exercise he will die.  He went around in circles about eating and exercising and even when I moved onto asking about repositioning for pressure relief he was still thinking I wanted him to eat.        General Comments General comments (skin integrity, edema, etc.): Pt VSS on RA     Exercises     Assessment/Plan    PT Assessment Patient needs continued PT services  PT Problem List Decreased strength;Decreased activity tolerance;Decreased balance;Decreased mobility;Decreased cognition;Decreased knowledge of use of DME;Decreased safety awareness;Decreased knowledge of precautions;Impaired sensation;Impaired tone;Pain       PT Treatment Interventions DME instruction;Functional mobility training;Therapeutic exercise;Therapeutic activities;Balance training;Neuromuscular re-education;Cognitive remediation;Patient/family education;Wheelchair mobility training;Stair training;Gait training    PT Goals (Current goals can be found in the Care Plan section)  Acute Rehab PT Goals Patient Stated Goal: Pt did not state, other than he did not want to eat Time For Goal Achievement: 05/06/20 Potential to Achieve Goals: Fair    Frequency Min 3X/week   Barriers to discharge        Co-evaluation               AM-PAC PT "6 Clicks" Mobility   Outcome Measure Help needed turning from your back to your side while in a flat bed without using bedrails?: Total Help needed moving from lying on your back to sitting on the side of a flat bed without using bedrails?: Total Help needed moving to and from a bed to a chair (including a wheelchair)?: Total Help needed standing up from a chair using your arms (e.g., wheelchair or bedside chair)?: Total Help needed to walk in hospital room?: Total Help needed climbing 3-5 steps with a railing? : Total 6 Click Score: 6    End of Session   Activity Tolerance: Patient limited by fatigue Patient left: in bed;with call bell/phone within reach Nurse Communication: Mobility status PT Visit Diagnosis: Muscle weakness (generalized) (M62.81);Difficulty in walking, not elsewhere classified (R26.2);Other symptoms and signs involving the nervous system (R29.898);Other (comment) (quadriparesis)    Time: 4163-8453 PT Time Calculation (min) (ACUTE ONLY):  26 min   Charges:   PT Evaluation $PT Eval Moderate Complexity: 1 Mod         Corinna Capra, PT, DPT  Acute Rehabilitation [phone] pager [phone]) [phone] office

## 2020-04-22 NOTE — Transfer of Care (Signed)
Immediate Anesthesia Transfer of Care Note  Patient: Shannon [name]  Procedure(s) Performed: Reexploration of anterior cervical wound for epidural hematoma (N/A Neck)  Patient Location: PACU  Anesthesia Type:General  Level of Consciousness: drowsy and patient cooperative  Airway & Oxygen Therapy: Patient Spontanous Breathing and Patient connected to face mask oxygen  Post-op Assessment: Report given to RN and Post -op Vital signs reviewed and stable  Post vital signs: Reviewed and stable  Last Vitals:  Vitals Value Taken Time  BP 129/74 04/22/20 0725  Temp    Pulse 100 04/22/20 0729  Resp 16 04/22/20 0729  SpO2 100 % 04/22/20 0729  Vitals shown include unvalidated device data.  Last Pain:  Vitals:   04/21/20 1802  PainSc: 3          Complications: No complications documented.

## 2020-04-22 NOTE — Anesthesia Preprocedure Evaluation (Signed)
Anesthesia Evaluation  Patient identified by MRN, date of birth, ID band Patient awake    Reviewed: Allergy & Precautions, NPO status , Patient's Chart, lab work & pertinent test results  Airway Mallampati: I  TM Distance: >3 FB Neck ROM: Full    Dental   Pulmonary former smoker,    Pulmonary exam normal        Cardiovascular hypertension, Pt. on medications Normal cardiovascular exam     Neuro/Psych Quadraparesis post ACDF 2 weeks ago    GI/Hepatic   Endo/Other    Renal/GU      Musculoskeletal   Abdominal   Peds  Hematology   Anesthesia Other Findings   Reproductive/Obstetrics                             Anesthesia Physical Anesthesia Plan  ASA: III and emergent  Anesthesia Plan: General   Post-op Pain Management:    Induction: Intravenous, Cricoid pressure planned and Rapid sequence  PONV Risk Score and Plan: 2 and Ondansetron and Midazolam  Airway Management Planned: Oral ETT  Additional Equipment:   Intra-op Plan:   Post-operative Plan: Extubation in OR  Informed Consent: I have reviewed the patients History and Physical, chart, labs and discussed the procedure including the risks, benefits and alternatives for the proposed anesthesia with the patient or authorized representative who has indicated his/her understanding and acceptance.       Plan Discussed with: CRNA and Surgeon  Anesthesia Plan Comments:         Anesthesia Quick Evaluation

## 2020-04-22 NOTE — Anesthesia Postprocedure Evaluation (Signed)
Anesthesia Post Note  Patient: Shannon [name]  Procedure(s) Performed: Reexploration of anterior cervical wound for epidural hematoma (N/A Neck)     Patient location during evaluation: PACU Anesthesia Type: General Level of consciousness: awake Pain management: pain level controlled Vital Signs Assessment: post-procedure vital signs reviewed and stable Respiratory status: spontaneous breathing, nonlabored ventilation, respiratory function stable and patient connected to nasal cannula oxygen Cardiovascular status: blood pressure returned to baseline and stable Postop Assessment: no apparent nausea or vomiting Anesthetic complications: no   No complications documented.  Last Vitals:  Vitals:   04/22/20 0838 04/22/20 1128  BP: 132/72   Pulse: 92 (!) 103  Resp: 16 15  Temp: 36.9 C 36.8 C  SpO2: 94% 93%    Last Pain:  Vitals:   04/22/20 1128  TempSrc: Oral  PainSc: 0-No pain                 George Haggart P Shelina Luo

## 2020-04-22 NOTE — Progress Notes (Signed)
Patient is a new admit s/p surgery. Upon arrival this am at [phone] patient was alert and oriented x 4 and continues to be oriented x 4 but has been noted throughout the shift to "reach for specific words" and refused assessment by PT. Patient refused pm meal and is resting quietly. Patient continues to deny pain. No family has called or visited this shift. Patient has spoken to RN a lot today about his faith in God and his beliefs of recovery but also about his feelings of inadequacy to be able to care for himself. Patient states he knows he has to get better so he can go home and care for himself.

## 2020-04-22 NOTE — Anesthesia Procedure Notes (Signed)
Procedure Name: Intubation Date/Time: 04/22/2020 5:37 AM Performed by: Claudina Lick, CRNA Pre-anesthesia Checklist: Patient identified, Emergency Drugs available, Suction available, Patient being monitored and Timeout performed Patient Re-evaluated:Patient Re-evaluated prior to induction Oxygen Delivery Method: Circle system utilized Preoxygenation: Pre-oxygenation with 100% oxygen Induction Type: IV induction, Rapid sequence and Cricoid Pressure applied Laryngoscope Size: Glidescope Grade View: Grade I Tube type: Oral Tube size: 7.5 mm Number of attempts: 1 Airway Equipment and Method: Stylet and Video-laryngoscopy Placement Confirmation: ETT inserted through vocal cords under direct vision,  positive ETCO2 and breath sounds checked- equal and bilateral Secured at: 23 cm Tube secured with: Tape Dental Injury: Teeth and Oropharynx as per pre-operative assessment

## 2020-04-23 LAB — BASIC METABOLIC PANEL
Anion gap: 11 (ref 5–15)
BUN: 14 mg/dL (ref 8–23)
CO2: 23 mmol/L (ref 22–32)
Calcium: 9 mg/dL (ref 8.9–10.3)
Chloride: 106 mmol/L (ref 98–111)
Creatinine, Ser: 1.08 mg/dL (ref 0.61–1.24)
GFR, Estimated: >60 mL/min (ref >60)
Glucose, Bld: 190 mg/dL — ABNORMAL HIGH (ref 70–99)
Potassium: 4 mmol/L (ref 3.5–5.1)
Sodium: 140 mmol/L (ref 135–145)

## 2020-04-23 NOTE — Progress Notes (Signed)
RN has attempted multiple times to discuss why patient will not eat and encourage him to eat. Patient states that God told him not to eat and therefore he will not eat. He also stated that he will eat AFTER he gets to the gym to work out and NOT until. RN was able to get the patient to drink water and drink one ensure. Patient stated "Satan didn't want me to drink that but I did." RN attempted to educate the patient on the need for food for energy, protein, and sugar but patient continues to refuse.

## 2020-04-23 NOTE — Evaluation (Signed)
Clinical/Bedside Swallow Evaluation Patient Details  Name: Shannon [name] MRN: [id number] Date of Birth: 08-Sep-1945  Today's Date: 04/23/2020 Time: SLP Start Time (ACUTE ONLY): 1525 SLP Stop Time (ACUTE ONLY): 1540 SLP Time Calculation (min) (ACUTE ONLY): 15 min  Past Medical History:  Past Medical History:  Diagnosis Date  . Asthma   . 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 Newell Coral, 1992-->surg at [facility].  Summer 2021->Cervicalgia,arm pain, hand atrophy RUE wkness, hyperreflex (Dr. 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 Hay fever   . Hypertension   . Osteoarthritis of both hands    Past Surgical History:  Past Surgical History:  Procedure Laterality Date  . ANTERIOR CERVICAL DECOMP/DISCECTOMY FUSION N/A 04/05/2020   Procedure: ANTERIOR CERVICAL DECOMPRESSION/DISCECTOMY FUSION, INTERBODY PROSTHESIS, PLATE/SCREWS CERVICAL THREE-CERVICAL FOUR, CERVICAL FOUR- CERVICAL FIVE;  Surgeon: Tressie Stalker, MD;  Location: [facility] OR;  Service: Neurosurgery;  Laterality: N/A;  . BACK SURGERY    . INCISION AND DRAINAGE Left 2012   L hand infection  . ROTATOR CUFF REPAIR Right    x 3  . SPINE SURGERY     HPI:  Pt is a 74 y.o. 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 Pt s/p evacuation of hemoatoma, removal of hardware and re-implantation of titanium cage packed with autograft bone on 04/22/20.  Pt in hard collar post op. CT head 10/29 negative for acute changes.    Assessment / Plan / Recommendation Clinical Impression  Pt was seen for bedside swallow evaluation. He reported that he must  shake liquids in his mouth prior to swallowing to "stop it from going into my throat and getting to the point when I get strangled". He also stated that he does this to "stop it from spilling over my tongue and choking me." Per the pt, he is asymptomatic of aspiration when he uses this strategy. The evaluation was limited to ice chips and water due to the pt's refusal of other consistencies. Pt stated that his goal is to get better so that he can go the gym and exercise to get back to baseline. He acknowledged that staff have told him that he has to eat to improve, but stated that he can only go back to eating after he starts exercising again because it is the exercise, and not the food, that will result in his improvement. Pt tolerated ice chips without overt s/sx of aspiration and mastication thereof was functional. Pt shook all liquids in his mouth prior to deglutition. He inconsistently exhibited throat clearing and a cough with thin liquids via cup and straw, suggesting aspiration. It is recommended that the current diet be continued and SLP will follow pt for further assessment of consistencies and further intervention as clinically indicated.  SLP Visit Diagnosis: Dysphagia, unspecified (R13.10)    Aspiration Risk  Mild aspiration risk    Diet Recommendation Thin liquid (Continue current diet)   Liquid Administration via: Cup;Straw Medication Administration: Whole meds with liquid Supervision: Comment (Staff to feed pt) Compensations: Slow rate;Small sips/bites Postural Changes: Seated upright at 90 degrees    Other  Recommendations Oral Care Recommendations: Oral care BID   Follow up  Recommendations Other (comment) (TBD)      Frequency and Duration min 2x/week  1 week       Prognosis Prognosis for Safe Diet Advancement: Good Barriers to Reach Goals: Cognitive deficits      Swallow Study   General Date of Onset: 04/22/20 HPI: Pt is a 74 y.o. male with significant PMH of OA in  bilateral hands, HTN, Chiari malformation admission to hospital for ACDF from 10/13-10/15/21 and then d/c to inpatient rehab from 10/15-10/27/21. 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 Pt s/p evacuation of hemoatoma, removal of hardware and re-implantation of titanium cage packed with autograft bone on 04/22/20.  Pt in hard collar post op. CT head 10/29 negative for acute changes.  Type of Study: Bedside Swallow Evaluation Previous Swallow Assessment: None Diet Prior to this Study: Regular;Thin liquids Temperature Spikes Noted: No Respiratory Status: Nasal cannula History of Recent Intubation: No Behavior/Cognition: Alert;Cooperative;Pleasant mood Oral Cavity Assessment: Within Functional Limits Oral Care Completed by SLP: No Oral Cavity - Dentition: Adequate natural dentition Vision: Functional for self-feeding Self-Feeding Abilities: Total assist Patient Positioning: Upright in bed;Postural control adequate for testing Baseline Vocal Quality: Normal Volitional Cough: Strong Volitional Swallow: Able to elicit    Oral/Motor/Sensory Function Overall Oral Motor/Sensory Function: Within functional limits   Ice Chips Ice chips: Within functional limits Presentation: Spoon   Thin Liquid Thin Liquid: Impaired Pharyngeal  Phase Impairments: Throat Clearing - Delayed    Nectar Thick Nectar Thick Liquid: Not tested   Honey Thick Honey Thick Liquid: Not tested   Puree Puree: Not tested ()Pt refused)   Solid     Solid: Not tested ()Pt refused)     Shannon [name] I. Vear Clock, MS, CCC-SLP Acute Rehabilitation Services Office number [phone] Pager [phone]  Scheryl Marten 04/23/2020,3:59 PM

## 2020-04-23 NOTE — TOC Progression Note (Signed)
Transition of Care [facility]) - Progression Note    Patient Details  Name: Shannon [name] MRN: [id number] Date of Birth: Oct 23, 1945  [facility]) CM/SW Contact  Vergie Living, LCSW Phone Number: 04/23/2020, 1:58 PM  Clinical Narrative:   CSW met with Pt at bedside to discuss SNF placement. Pt verbalized agreement with necessity of SNF placement.  CSW completed TOC initial assessment, obtained PASRR and FL2. CSW attempted to contact Pt's daughter Shannon [name] @ [phone] update no option to leave voicemail.     Expected Discharge Plan: [place] Barriers to Discharge: Continued Medical Work up  Expected Discharge Plan and Services Expected Discharge Plan: [place] arrangements for the past 2 months: Single Family Home                                       Social Determinants of Health (SDOH) Interventions    Readmission Risk Interventions No flowsheet data found.

## 2020-04-23 NOTE — NC FL2 (Signed)
[place] MEDICAID FL2 LEVEL OF CARE SCREENING TOOL     IDENTIFICATION  Patient Name: Shannon [name] Birthdate: [birth year] Sex: male Admission Date (Current Location): 04/21/2020  [facility] [place] Number:  Producer, television/film/video and Address:  [facility], [address] [place], [place] [address]      Provider Number: [id number]  Attending Physician Name and Address:  Tressie Stalker, MD  Relative Name and Phone Number:  [place],[name] Daughter   [phone]    Current Level of Care: Hospital Recommended Level of Care: Skilled Nursing Facility Prior Approval Number:    Date Approved/Denied:   PASRR Number: [id number]  Discharge Plan: SNF    Current Diagnoses: Patient Active Problem List   Diagnosis Date Noted  . 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 Myelopathy (HCC) 04/22/2020  . Quadriplegia and quadriparesis (HCC)   . Acute blood loss anemia   . Slow transit constipation   . Essential hypertension   . Cervical myelopathy (HCC) 04/07/2020  . Quadriparesis (HCC)   . Benign essential HTN   . Post-operative pain   . Neuropathic pain   . Asthma   . 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    Orientation RESPIRATION BLADDER Height & Weight     Self, Time, Situation, Place  O2 (nasal canula) Incontinent, External catheter Weight: 200 lb 9.9 oz (91 kg) Height:  5\' 10"  (177.8 cm)  BEHAVIORAL SYMPTOMS/MOOD NEUROLOGICAL BOWEL NUTRITION STATUS      Incontinent Diet (regular)  AMBULATORY STATUS COMMUNICATION OF NEEDS Skin   Extensive Assist Verbally Normal                       Personal Care Assistance Level of Assistance  Bathing, Feeding, Dressing Bathing Assistance: Maximum assistance Feeding assistance: Maximum assistance Dressing Assistance: Maximum assistance     Functional Limitations Info  Sight, Hearing, Speech Sight Info: Adequate Hearing Info: Adequate Speech Info: Adequate     SPECIAL CARE FACTORS FREQUENCY  PT (By licensed PT), OT (By licensed OT)     PT Frequency: 5x weekly OT Frequency: 5x weekly            Contractures Contractures Info: Not present    Additional Factors Info  Code Status, Allergies Code Status Info: Full Allergies Info: Iodine,Penicillins           Current Medications (04/23/2020):  This is the current hospital active medication list Current Facility-Administered Medications  Medication Dose Route Frequency Provider Last Rate Last Admin  . 0.9 %  sodium chloride infusion  250 mL Intravenous Continuous 04/25/2020, MD      . acetaminophen (TYLENOL) tablet 650 mg  650 mg Oral Q4H PRN Donalee Citrin, MD       Or  . acetaminophen (TYLENOL) suppository 650 mg  650 mg Rectal Q4H PRN Donalee Citrin, MD      . alum & mag hydroxide-simeth (MAALOX/MYLANTA) 200-200-20 MG/5ML suspension 30 mL  30 mL Oral Q6H PRN 12-31-2000, MD      . amLODipine (NORVASC) tablet 10 mg  10 mg Oral Daily Donalee Citrin, MD   10 mg at 04/23/20 1046  . Chlorhexidine Gluconate Cloth 2 % PADS 6 each  6 each Topical Daily 04/25/20, MD   6 each at 04/23/20 1046  . cyclobenzaprine (FLEXERIL) tablet 10 mg  10 mg Oral TID PRN 04/25/20, MD      . dexamethasone (DECADRON) injection 10 mg  10 mg  Intravenous Q6H Donalee Citrin, MD   10 mg at 04/23/20 1345  . docusate sodium (COLACE) capsule 100 mg  100 mg Oral BID Donalee Citrin, MD   100 mg at 04/23/20 1046  . HYDROmorphone (DILAUDID) injection 0.5 mg  0.5 mg Intravenous Q2H PRN Donalee Citrin, MD      . menthol-cetylpyridinium (CEPACOL) lozenge 3 mg  1 lozenge Oral PRN Donalee Citrin, MD       Or  . phenol (CHLORASEPTIC) mouth spray 1 spray  1 spray Mouth/Throat PRN Donalee Citrin, MD      . ondansetron [facility]) tablet 4 mg  4 mg Oral Q6H PRN Donalee Citrin, MD       Or  . ondansetron [facility]) injection 4 mg  4 mg Intravenous Q6H PRN Donalee Citrin, MD      . oxyCODONE (Oxy IR/ROXICODONE) immediate release tablet 10 mg  10 mg Oral Q3H PRN Donalee Citrin, MD      . pantoprazole (PROTONIX) EC tablet 40 mg  40 mg Oral BID Donalee Citrin, MD   40 mg at 04/23/20 1046  . sodium chloride flush (NS) 0.9 % injection 3 mL  3 mL Intravenous Q12H Donalee Citrin, MD   3 mL at 04/22/20 2143  . sodium chloride flush (NS) 0.9 % injection 3 mL  3 mL Intravenous PRN Donalee Citrin, MD      . vancomycin (VANCOCIN) IVPB 1000 mg/200 mL premix  1,000 mg Intravenous Q12H Ilda Basset, [place] 200 mL/hr at 04/23/20 0544 1,000 mg at 04/23/20 0544     Discharge Medications: Please see discharge summary for a list of discharge medications.  Relevant Imaging Results:  Relevant Lab Results:   Additional Information SSN#[id number]  Shella Spearing, LCSW

## 2020-04-23 NOTE — Evaluation (Signed)
Occupational Therapy Evaluation Patient Details Name: Shannon [name] MRN: [id number] DOB: October 03, 1945 Today's Date: 04/23/2020    History of Present Illness 74 y.o. 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  Pt in hard collar post op.  Pt with significant PMH of admission to hospital for cervical fusion from 10/13-10/15/21 and then d/c to inpatient rehab from 10/15-10/27/21.  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     Clinical Impression   PTA pt living alone with intermittent assist from aides and daughter for ADL/IADL. Pt had recently been d/c from CIR 10/27 and apparently had a fall that resulted in current presentation. At time of eval, pt is total A to reposition himself in bed. Pt with diffuse weakness in all extremities. Pt LUE appears stronger than RUE, while RLE appears weaker then LLE. Pt not currently able to perform functional hand <> mouth pattern with either extremity at this time (see below UE section for full assessment). He currently presents with bizarre cognitive presentation, talking about being in the ocean and sharks being virtual and not able to eat him. Pt required max redirection to stay on task. Overall has very poor awareness of current deficits. Given current status, recommend SNF at d/c for continued progression of ADL safety prior to return home. Will continue to follow per POC listed below.    Follow Up Recommendations  SNF;Supervision/Assistance - 24 hour    Equipment Recommendations  None recommended by OT    Recommendations for Other Services       Precautions / Restrictions Precautions Precautions: Fall;Cervical Precaution Comments: reviewed precautions throughout session Required Braces or Orthoses: Cervical  Brace Cervical Brace: Hard collar;At all times Restrictions Weight Bearing Restrictions: No      Mobility Bed Mobility Overal bed mobility: Needs Assistance Bed Mobility: Rolling Rolling: Total assist         General bed mobility comments: total A to reposition in bed. Had pt attempt to pull to sitting in long sitting position, requires total A and "hook" formulation between OT and pts arms due to hand weakness    Transfers                      Balance                                           ADL either performed or assessed with clinical judgement   ADL Overall ADL's : Needs assistance/impaired                                       General ADL Comments: Pt currently requires total A for all ADLs at this time. he not able to actively use UEs for ADL tasks.     Vision Patient Visual Report: No change from baseline       Perception     Praxis      Pertinent Vitals/Pain Pain Assessment: No/denies pain     Hand Dominance Right   Extremity/Trunk Assessment Upper Extremity Assessment Upper Extremity Assessment: RUE deficits/detail;LUE deficits/detail RUE Deficits / Details: activation of scapular musculature. Trace  movement in deltoid and bicep. Able to partially flex wrist in graavity eliminated plane. Very weak ability to move fingers. Intrinsic minus positon forming in hand. Unable to use extremity functionally. RUE Coordination: decreased fine motor;decreased gross motor LUE Deficits / Details: activation of scapula musculature. Trace contraction at deltoid and bicep. Able to flex wrist anti gravity, but weaker compared to the other side. Not able to use UE functionally. LUE Coordination: decreased fine motor;decreased gross motor   Lower Extremity Assessment Lower Extremity Assessment: Defer to PT evaluation       Communication Communication Communication: No difficulties   Cognition Arousal/Alertness:  Awake/alert Behavior During Therapy: WFL for tasks assessed/performed Overall Cognitive Status: No family/caregiver present to determine baseline cognitive functioning Area of Impairment: Attention;Memory;Following commands;Safety/judgement;Problem solving                   Current Attention Level: Sustained Memory: Decreased recall of precautions Following Commands: Follows one step commands inconsistently;Follows one step commands with increased time Safety/Judgement: Decreased awareness of safety;Decreased awareness of deficits   Problem Solving: Slow processing;Requires verbal cues;Requires tactile cues General Comments: Pt presenting with bizarre behavior and conversations, stating how "this is all virtual" and "we are in the ocean and cannot drink all the water, but the sharks cannot get [place] because they are virtual". Max cues needed to redirect. Pt having difficulty being realizstic about recovery process. Also refusing to eat due to eat unless he exercises   General Comments       Exercises     Shoulder Instructions      Home Living Family/patient expects to be discharged to:: Private residence Living Arrangements: Alone Available Help at Discharge: Family;Available 24 hours/day Type of Home: House Home Access: Stairs to enter [employer] of Steps: 5-6 Entrance Stairs-Rails: Right Home Layout: One level     Bathroom Shower/Tub: Tub/shower unit;Curtain   Firefighter: Standard     Home Equipment: Environmental consultant - 4 wheels          Prior Functioning/Environment Level of Independence: Needs assistance  Gait / Transfers Assistance Needed: per rehab d/c notes he was walking with a 4 wheeled RW 200', not sure how he was doing at home, but had two falls since returning home (per RN report) ADL's / Homemaking Assistance Needed: had an aide ordered after rehab, daughter was also staying with pt to assist as needed            OT Problem List: Decreased  strength;Decreased range of motion;Impaired balance (sitting and/or standing);Decreased coordination;Decreased safety awareness;Decreased knowledge of use of DME or AE;Decreased knowledge of precautions;Impaired sensation;Impaired UE functional use;Decreased activity tolerance      OT Treatment/Interventions: Self-care/ADL training;Therapeutic exercise;Neuromuscular education;Energy conservation;DME and/or AE instruction;Splinting;Therapeutic activities;Patient/family education;Balance training;Cognitive remediation/compensation    OT Goals(Current goals can be found in the care plan section) Acute Rehab OT Goals Patient Stated Goal: to go back home independent OT Goal Formulation: With patient Time For Goal Achievement: 05/07/20 Potential to Achieve Goals: Fair  OT Frequency: Min 2X/week   Barriers to D/C:            Co-evaluation              AM-PAC OT "6 Clicks" Daily Activity     Outcome Measure Help from another person eating meals?: Total Help from another person taking care of personal grooming?: Total Help from another person toileting, which includes using toliet, bedpan, or urinal?: Total Help from another person bathing (including washing, rinsing, drying)?: Total Help  from another person to put on and taking off regular upper body clothing?: Total Help from another person to put on and taking off regular lower body clothing?: Total 6 Click Score: 6   End of Session Equipment Utilized During Treatment: Cervical collar Nurse Communication: Mobility status;Need for lift equipment;Precautions  Activity Tolerance: Patient tolerated treatment well Patient left: with call bell/phone within reach;in bed  OT Visit Diagnosis: Unsteadiness on feet (R26.81);Muscle weakness (generalized) (M62.81);Other symptoms and signs involving the nervous system (R29.898)                Time: 5208-0223 OT Time Calculation (min): 23 min Charges:  OT General Charges $OT Visit: 1  Visit OT Evaluation $OT Eval Moderate Complexity: 1 Mod OT Treatments $Self Care/Home Management : 8-22 mins  Dalphine Handing, MSOT, OTR/L Acute Rehabilitation Services [facility] Office Number: [phone] Pager: [phone]  Dalphine Handing 04/23/2020, 2:12 PM

## 2020-04-23 NOTE — Progress Notes (Signed)
Patient is oriented to person,place,time, and situation but has been noted by PT,OT,CNA, and RNs to at times have bizarre conversations that do not fit the situation or topic at hand. Patient continues to deny pain, refuse to eat, and has unrealistic expectations about his current mobility. Patient states he will not eat until he is exercising so he will not gain weight. No other changes neurologically during assessments.

## 2020-04-23 NOTE — Progress Notes (Signed)
Orthopedic Tech Progress Note Patient Details:  Shannon [name] 02/09/1946 [id number] Confirmed with RN that pt is wearing cervical collar Patient ID: Shannon [name], male   DOB: [birth year], 74 y.o.   MRN: [id number]   Gerald Stabs 04/23/2020, 12:58 PM

## 2020-04-23 NOTE — Progress Notes (Signed)
NEUROSURGERY PROGRESS NOTE  Doing ok this morning, strength this morning is unchanged since preop, no effort against gravity in all 4 extremities, can wiggle fingers and toes.   Temp:  [97.8 F (36.6 C)-98.5 F (36.9 C)] 98.5 F (36.9 C) (10/31 0806) Pulse Rate:  [89-123] 92 (10/31 0806) Resp:  [15-22] 22 (10/31 0806) BP: (117-133)/(69-87) 129/72 (10/31 0806) SpO2:  [89 %-96 %] 96 % (10/31 0806)  Plan: Continue pain management, therapy today. Drain was not holding suction and was pulled halfway out so dc drain.  Sherryl Manges, NP 04/23/2020 9:20 AM

## 2020-04-24 ENCOUNTER — Encounter (HOSPITAL_COMMUNITY): Payer: Self-pay | Admitting: Neurosurgery

## 2020-04-24 NOTE — Progress Notes (Signed)
  Speech Language Pathology Treatment: Dysphagia  Patient Details Name: Shannon [name] MRN: [id number] DOB: July 02, 1945 Today's Date: 04/24/2020 Time: 4580-9983 SLP Time Calculation (min) (ACUTE ONLY): 13 min  Assessment / Plan / Recommendation Clinical Impression  Pt was seen for skilled ST targeting diagnostic treatment.  RN reported that pt continued to refuse all solid foods this morning and that he had intermittent difficulty with taking whole pills with thin liquid (he would not allow RN to crush them in puree).  Pt was encountered awake/alert and he was agreeable to one trial each of puree and regular solids (cracker) in addition to thin liquid via straw sip.  He tolerated the puree without difficulty, but mastication of regular solids was moderately prolonged, with oral residue noted.  Pt was sensate to residue and requested water as a liquid wash.  Cough was observed x2 when thin liquid was used in this context.  Pt reported that he has been having intermittent coughing with thin liquid in the absence of solids as well.  No additional s/sx of aspiration were observed on this date.  Recommend diet change to Dysphagia 1 (puree) solids and thin liquids with medications administered crushed in puree.  Additionally recommend an instrumental swallow study to further evaluate swallow function given intermittent clinical s/sx of aspiration with thin liquids.  Pt was agreeable to participate in a MBS tomorrow (11/2).  SLP will f/u per POC.     HPI HPI: Pt is a 74 y.o. 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 Pt s/p evacuation of hemoatoma, removal of hardware and re-implantation of titanium cage packed with autograft bone on 04/22/20.  Pt in  hard collar post op. CT head 10/29 negative for acute changes.       SLP Plan  MBS       Recommendations  Diet recommendations: Thin liquid;Dysphagia 1 (puree) Liquids provided via: Straw;Cup Medication Administration: Crushed with puree Supervision: Staff to assist with self feeding Compensations: Slow rate;Small sips/bites Postural Changes and/or Swallow Maneuvers: Seated upright 90 degrees                Oral Care Recommendations: Oral care BID Follow up Recommendations: Other (comment) (TBD) SLP Visit Diagnosis: Dysphagia, unspecified (R13.10) Plan: MBS       GO               Shannon [name]., M.S., CCC-SLP Acute Rehabilitation Services Office: [phone]  Shannon [name] [facility] 04/24/2020, 11:27 AM

## 2020-04-24 NOTE — Progress Notes (Signed)
Subjective: The patient is alert and pleasant.  He is in no apparent distress.  He denies pain.  Objective: Vital signs in last 24 hours: Temp:  [97.4 F (36.3 C)-98.2 F (36.8 C)] 98.2 F (36.8 C) (11/01 1110) Pulse Rate:  [76-95] 83 (11/01 1110) Resp:  [17-22] 20 (11/01 1110) BP: (98-125)/(70-79) 98/75 (11/01 1110) SpO2:  [94 %-99 %] 98 % (11/01 1110) Estimated body mass index is 28.79 kg/m as calculated from the following:   Height as of this encounter: 5\' 10"  (1.778 m).   Weight as of this encounter: 91 kg.   Intake/Output from previous day: 10/31 0701 - 11/01 0700 In: 2441.4 [P.O.:1860; IV Piggyback:581.4] Out: 1925 [Urine:1925] Intake/Output this shift: Total I/O In: 600 [P.O.:600] Out: 200 [Urine:200]  Physical exam the patient is alert and oriented.  His right grip and tricep strength is approximately 4-/5.  His left hand grip strength is approximately 2/5.  He has good distal lower extremity strength at at least 4+ or 5 as below gastrocnemius and dorsiflexors.  His dressing is clean and dry.  There is no hematoma or shift.  Lab Results: Recent Labs    04/21/20 1523  WBC 10.3  HGB 14.9  HCT 47.4  PLT 209   BMET Recent Labs    04/21/20 1523 04/23/20 0741  NA 138 140  K 4.3 4.0  CL 104 106  CO2 18* 23  GLUCOSE 141* 190*  BUN 7* 14  CREATININE 1.16 1.08  CALCIUM 9.5 9.0    Studies/Results: No results found.  Assessment/Plan: Postop day #2: The patient has improved neurologically status post his fall/fracture/hematoma and subsequent surgery.  Hopefully he will continue to improve.  We will get x-rays in a few days.  He may need posterior instrumentation and fusion as well.  I encouraged him to eat to aid in his recovery.  He states that God will provide what he needs.   e LOS: 2 days     04/25/20 04/24/2020, 12:55 PM

## 2020-04-25 ENCOUNTER — Inpatient Hospital Stay (HOSPITAL_COMMUNITY): Payer: Medicare Other

## 2020-04-25 NOTE — NC FL2 (Signed)
[place] MEDICAID FL2 LEVEL OF CARE SCREENING TOOL     IDENTIFICATION  Patient Name: Shannon [name] Birthdate: 05-31-1946 Sex: male Admission Date (Current Location): 04/21/2020  [facility] [place] and [place] Number:  Producer, television/film/video and Address:  [facility], [address], [place], [place] [address]      Provider Number: [id number]  Attending Physician Name and Address:  Tressie Stalker, MD  Relative Name and Phone Number:  Bruski,[name] Daughter   [phone]    Current Level of Care: Hospital Recommended Level of Care: Skilled Nursing Facility Prior Approval Number:    Date Approved/Denied:   PASRR Number: [id number]  Discharge Plan: SNF    Current Diagnoses: Patient Active Problem List   Diagnosis Date Noted  . 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 Myelopathy (HCC) 04/22/2020  . Quadriplegia and quadriparesis (HCC)   . Acute blood loss anemia   . Slow transit constipation   . Essential hypertension   . Cervical myelopathy (HCC) 04/07/2020  . Quadriparesis (HCC)   . Benign essential HTN   . Post-operative pain   . Neuropathic pain   . Asthma   . 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    Orientation RESPIRATION BLADDER Height & Weight     Self, Time, Situation, Place  O2 (nasal canula) Incontinent, External catheter Weight: 200 lb 9.9 oz (91 kg) Height:  5\' 10"  (177.8 cm)  BEHAVIORAL SYMPTOMS/MOOD NEUROLOGICAL BOWEL NUTRITION STATUS      Incontinent Diet (regular)  AMBULATORY STATUS COMMUNICATION OF NEEDS Skin   Extensive Assist Verbally Normal                       Personal Care Assistance Level of Assistance  Bathing, Feeding, Dressing Bathing Assistance: Maximum assistance Feeding assistance: Maximum assistance Dressing Assistance: Maximum assistance     Functional Limitations Info  Sight, Hearing, Speech Sight Info: Adequate Hearing Info: Adequate Speech Info: Adequate     SPECIAL CARE FACTORS FREQUENCY  PT (By licensed PT), OT (By licensed OT)     PT Frequency: 5x weekly OT Frequency: 5x weekly            Contractures Contractures Info: Not present    Additional Factors Info  Code Status, Allergies Code Status Info: Full Allergies Info: Iodine,Penicillins           Current Medications (04/25/2020):  This is the current hospital active medication list Current Facility-Administered Medications  Medication Dose Route Frequency Provider Last Rate Last Admin  . 0.9 %  sodium chloride infusion  250 mL Intravenous Continuous 13/07/2019, MD      . acetaminophen (TYLENOL) tablet 650 mg  650 mg Oral Q4H PRN Donalee Citrin, MD       Or  . acetaminophen (TYLENOL) suppository 650 mg  650 mg Rectal Q4H PRN Donalee Citrin, MD      . alum & mag hydroxide-simeth (MAALOX/MYLANTA) 200-200-20 MG/5ML suspension 30 mL  30 mL Oral Q6H PRN 12-31-2000, MD      . amLODipine (NORVASC) tablet 10 mg  10 mg Oral Daily Donalee Citrin, MD   10 mg at 04/25/20 1004  . Chlorhexidine Gluconate Cloth 2 % PADS 6 each  6 each Topical Daily 13/02/21, MD   6 each at 04/25/20 1004  . cyclobenzaprine (FLEXERIL) tablet 10 mg  10 mg Oral TID PRN 13/02/21, MD      . dexamethasone (DECADRON) injection 10 mg  10 mg  Intravenous Q6H Donalee Citrin, MD   10 mg at 04/25/20 0539  . docusate sodium (COLACE) capsule 100 mg  100 mg Oral BID Donalee Citrin, MD   100 mg at 04/25/20 1004  . HYDROmorphone (DILAUDID) injection 0.5 mg  0.5 mg Intravenous Q2H PRN Donalee Citrin, MD      . menthol-cetylpyridinium (CEPACOL) lozenge 3 mg  1 lozenge Oral PRN Donalee Citrin, MD       Or  . phenol (CHLORASEPTIC) mouth spray 1 spray  1 spray Mouth/Throat PRN Donalee Citrin, MD      . ondansetron [facility]) tablet 4 mg  4 mg Oral Q6H PRN Donalee Citrin, MD       Or  . ondansetron [facility]) injection 4 mg  4 mg Intravenous Q6H PRN Donalee Citrin, MD      . oxyCODONE (Oxy IR/ROXICODONE) immediate release tablet 10 mg  10 mg Oral Q3H PRN Donalee Citrin, MD      . pantoprazole (PROTONIX) EC tablet 40 mg  40 mg Oral BID Donalee Citrin, MD   40 mg at 04/25/20 1004  . sodium chloride flush (NS) 0.9 % injection 3 mL  3 mL Intravenous Q12H Donalee Citrin, MD   3 mL at 04/25/20 1012  . sodium chloride flush (NS) 0.9 % injection 3 mL  3 mL Intravenous PRN Donalee Citrin, MD         Discharge Medications: Please see discharge summary for a list of discharge medications.  Relevant Imaging Results:  Relevant Lab Results:   Additional Information SSN#[id number]  Eduard Roux, LCSWA

## 2020-04-25 NOTE — Progress Notes (Signed)
Physical Therapy Treatment Patient Details Name: Shannon [name] MRN: [id number] DOB: 1946-03-20 Today's Date: 04/25/2020    History of Present Illness 74 y.o. 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  Pt in hard collar post op.  Pt with significant PMH of admission to hospital for cervical fusion from 10/13-10/15/21 and then d/c to inpatient rehab from 10/15-10/27/21.  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      PT Comments    Pt with flat affect, but is cooperative and agreeable during mobility. Pt requires max-total +2 for bed mobility tasks this session, and max assist to maintain upright sitting balance. Pt with poor trunk control at EOB, along with significant extremity weakness. PT to continue to progress pt mobility as able.     Follow Up Recommendations  SNF     Equipment Recommendations  Wheelchair (measurements PT);Wheelchair cushion (measurements PT);Hospital bed;Other (comment) (hoyer lift)    Recommendations for Other Services       Precautions / Restrictions Precautions Precautions: Fall;Cervical Precaution Comments: log roll in and out of bed Required Braces or Orthoses: Cervical Brace Cervical Brace: Hard collar;At all times    Mobility  Bed Mobility Overal bed mobility: Needs Assistance Bed Mobility: Rolling;Sidelying to Sit;Sit to Sidelying Rolling: Max assist Sidelying to sit: +2 for physical assistance;+2 for safety/equipment;Total assist     Sit to sidelying: +2 for physical assistance;+2 for safety/equipment;Total assist;HOB elevated General bed mobility comments: max assist for roll to R for trunk and LE management, PT facilitating LUE reaching across body to reach for handrail. total +2 for sidelying<>sit for  trunk and LE management, scooting to and from EOB.  Transfers                 General transfer comment: unable  Ambulation/Gait             General Gait Details: unable   Stairs             Wheelchair Mobility    Modified Rankin (Stroke Patients Only)       Balance Overall balance assessment: Needs assistance Sitting-balance support: Feet supported;Bilateral upper extremity supported Sitting balance-Leahy Scale: Zero Sitting balance - Comments: max posterior assist to maintain upright sitting Postural control: Posterior lean     Standing balance comment: unable to attempt this day                            Cognition Arousal/Alertness: Awake/alert Behavior During Therapy: WFL for tasks assessed/performed;Flat affect Overall Cognitive Status: No family/caregiver present to determine baseline cognitive functioning Area of Impairment: Attention;Memory;Following commands;Safety/judgement;Problem solving                   Current Attention Level: Selective Memory: Decreased recall of precautions Following Commands: Follows one step commands with increased time Safety/Judgement: Decreased awareness of safety;Decreased awareness of deficits   Problem Solving: Slow processing;Requires verbal cues;Requires tactile cues General Comments: Flat affect and minimal verbalizations today, when asked why he isn't speaking he whispers "I don't feel like speaking much today". Follows commands with increased time and physical assist to complete.      Exercises General Exercises - Lower Extremity Long Arc Quad: AAROM;Both;5 reps;Seated Other Exercises Other Exercises: Lateral propping in sitting, each direction,  x5 sec hold, max assist to right balance    General Comments        Pertinent Vitals/Pain Pain Assessment: Faces Faces Pain Scale: Hurts a little bit Pain Location: neck, generalized Pain Descriptors / Indicators:  Sore;Discomfort Pain Intervention(s): Limited activity within patient's tolerance;Monitored during session;Repositioned    Home Living                      Prior Function            PT Goals (current goals can now be found in the care plan section) Acute Rehab PT Goals PT Goal Formulation: With patient Time For Goal Achievement: 05/06/20 Potential to Achieve Goals: Fair Progress towards PT goals: Progressing toward goals    Frequency    Min 3X/week      PT Plan Current plan remains appropriate    Co-evaluation              AM-PAC PT "6 Clicks" Mobility   Outcome Measure  Help needed turning from your back to your side while in a flat bed without using bedrails?: Total Help needed moving from lying on your back to sitting on the side of a flat bed without using bedrails?: Total Help needed moving to and from a bed to a chair (including a wheelchair)?: Total Help needed standing up from a chair using your arms (e.g., wheelchair or bedside chair)?: Total Help needed to walk in hospital room?: Total Help needed climbing 3-5 steps with a railing? : Total 6 Click Score: 6    End of Session Equipment Utilized During Treatment: Cervical collar Activity Tolerance: Patient limited by fatigue Patient left: in bed;with call bell/phone within reach Nurse Communication: Mobility status PT Visit Diagnosis: Muscle weakness (generalized) (M62.81);Difficulty in walking, not elsewhere classified (R26.2);Other symptoms and signs involving the nervous system (R29.898);Other (comment) (quadriparesis)     Time: 7893-8101 PT Time Calculation (min) (ACUTE ONLY): 28 min  Charges:  $Therapeutic Activity: 8-22 mins                     Abaigeal Moomaw E, PT Acute Rehabilitation Services Pager [phone]  Office [phone]     Tyrone Apple D Despina Hidden 04/25/2020, 5:27 PM

## 2020-04-25 NOTE — Progress Notes (Signed)
Modified Barium Swallow Progress Note  Patient Details  Name: Shannon [name] MRN: [id number] Date of Birth: [birth year]  Today's Date: 04/25/2020  Modified Barium Swallow completed.  Full report located under Chart Review in the Imaging Section.  Brief recommendations include the following:  Clinical Impression   Pt presents with a mild oropharyngeal swallow but with no aspiration or penetration observed. He has reduced posterior propulsion orally with mildly slow mastication and lingual rocking with solids. Lingual residue c/b a mild coating on the tongue is also noted with solids. His base of tongue retraction is mildly reduced, leading to mild vallecular residue with solids. His UES is opening also appears to be mildly reduced, which could be impacted by location of cervical hardware. Recommend continuing current diet with SLP f/u for advancement to more solid textures, which can be done clinically.    Swallow Evaluation Recommendations       SLP Diet Recommendations: Dysphagia 1 (Puree) solids;Thin liquid   Liquid Administration via: Cup;Straw   Medication Administration: Whole meds with puree   Supervision: Staff to assist with self feeding   Compensations: Slow rate;Small sips/bites   Postural Changes: Seated upright at 90 degrees   Oral Care Recommendations: Oral care BID        Mahala Menghini., M.A. CCC-SLP Acute Rehabilitation Services Pager [phone] Office [phone]  04/25/2020,4:04 PM

## 2020-04-25 NOTE — Progress Notes (Signed)
Subjective:  The patient is alert and pleasant.  He has no complaints.  He is in no apparent distress.  Objective: Vital signs in last 24 hours: Temp:  [97.8 F (36.6 C)-98.6 F (37 C)] 98.2 F (36.8 C) (11/02 0706) Pulse Rate:  [73-83] 73 (11/02 0706) Resp:  [15-20] 20 (11/02 0706) BP: (98-127)/(71-79) 127/73 (11/02 0706) SpO2:  [91 %-98 %] 96 % (11/02 0706) Estimated body mass index is 28.79 kg/m as calculated from the following:   Height as of this encounter: 5\' 10"  (1.778 m).   Weight as of this encounter: 91 kg.   Intake/Output from previous day: 11/01 0701 - 11/02 0700 In: 1047 [P.O.:987; I.V.:60] Out: 1750 [Urine:1750] Intake/Output this shift: Total I/O In: 240 [P.O.:240] Out: 400 [Urine:400]  Physical exam   The patient is alert and oriented.  His cervical incision is healing well.  There is no hematoma or shift.  His strength is unchanged from yesterday.  Lab Results: No results for input(s): WBC, HGB, HCT, PLT in the last 72 hours. BMET Recent Labs   04/23/20 0741 NA 140 K 4.0 CL 106 CO2 23 GLUCOSE 190* BUN 14 CREATININE 1.08 CALCIUM 9.0   Studies/Results: No results found.  Assessment/Plan:   Postop day 3.:  The patient is slowly progressing.  It looks like he will need rehab versus skilled nursing facility.  LOS: 3 days     04/25/20 04/25/2020, 9:17 AM

## 2020-04-25 NOTE — Progress Notes (Signed)
Pt was desating into the high 80s (87-89) on 2L [place] while sleeping due to patient being a mouth breather. This nurse turned oxygen up to 4L patient is sating at 93.

## 2020-04-25 NOTE — Care Management Important Message (Signed)
Important Message  Patient Details  Name: Shannon [name] MRN: [id number] Date of Birth: 04-Mar-1946   Medicare Important Message Given:  Yes     Dorena Bodo 04/25/2020, 3:53 PM

## 2020-04-25 NOTE — Progress Notes (Signed)
Occupational Therapy Treatment Patient Details Name: Shannon [name] MRN: [id number] DOB: November 29, 1945 Today's Date: 04/25/2020    History of present illness 74 y.o. 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  Pt in hard collar post op.  Pt with significant PMH of admission to hospital for cervical fusion from 10/13-10/15/21 and then d/c to inpatient rehab from 10/15-10/27/21.  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     OT comments  Pt making gradual progress towards OT goals, presents supine in bed agreeable to therapy session. Pt with increased muscle activation in bil UE and LEs, though he continues to have limitations due to significant weakness throughout. Pt tolerating EOB approx 10 min during session, requiring two person assist for bed mobility and up to maxA for static balance EOB. He continues to require overall totalAf for ADL, though did discuss having his daughter bring in self-feeding AE from previous hospital stay to increase independence with ADL task. VSS throughout. Will continue per POC at this time.    Follow Up Recommendations  SNF;Supervision/Assistance - 24 hour    Equipment Recommendations  None recommended by OT          Precautions / Restrictions Precautions Precautions: Fall;Cervical Precaution Comments: log roll in and out of bed Required Braces or Orthoses: Cervical Brace Cervical Brace: Hard collar;At all times Restrictions Weight Bearing Restrictions: No       Mobility Bed Mobility Overal bed mobility: Needs Assistance Bed Mobility: Rolling;Sidelying to Sit;Sit to Sidelying Rolling: Max assist Sidelying to sit: +2 for physical assistance;+2 for safety/equipment;Total assist     Sit to sidelying: +2 for physical  assistance;+2 for safety/equipment;Total assist;HOB elevated General bed mobility comments: max assist for roll to R for trunk and LE management, PT facilitating LUE reaching across body to reach for handrail. total +2 for sidelying<>sit for trunk and LE management, scooting to and from EOB.  Transfers                 General transfer comment: unable    Balance Overall balance assessment: Needs assistance Sitting-balance support: Feet supported;Bilateral upper extremity supported Sitting balance-Leahy Scale: Zero Sitting balance - Comments: max posterior assist to maintain upright sitting Postural control: Posterior lean     Standing balance comment: unable to attempt this day                           ADL either performed or assessed with clinical judgement   ADL Overall ADL's : Needs assistance/impaired Eating/Feeding: Total assistance Eating/Feeding Details (indicate cue type and reason): discussed having dtr bring in pt's AE from CIR stay to promote further independence wtih ADL - pt currently totalA  Grooming: Maximal assistance;Total assistance;Sitting;Wash/dry face Grooming Details (indicate cue type and reason): hand over hand assist for completion                              Functional mobility during ADLs: Total assistance;+2 for physical assistance;+2 for safety/equipment (bed mobility)                         Cognition Arousal/Alertness: Awake/alert Behavior During Therapy: WFL for tasks assessed/performed;Flat affect Overall Cognitive Status: No family/caregiver  present to determine baseline cognitive functioning Area of Impairment: Attention;Memory;Following commands;Safety/judgement;Problem solving                   Current Attention Level: Selective Memory: Decreased recall of precautions Following Commands: Follows one step commands with increased time Safety/Judgement: Decreased awareness of safety;Decreased  awareness of deficits   Problem Solving: Slow processing;Requires verbal cues;Requires tactile cues General Comments: Flat affect and minimal verbalizations today, when asked why he isn't speaking he whispers "I don't feel like speaking much today". Follows commands with increased time and physical assist to complete.        Exercises General Exercises - Lower Extremity Long Arc Quad: AAROM;Both;5 reps;Seated Other Exercises Other Exercises: Lateral propping in sitting, each direction, x5 sec hold, max assist to right balance   Shoulder Instructions       General Comments      Pertinent Vitals/ Pain       Pain Assessment: Faces Faces Pain Scale: Hurts a little bit Pain Location: neck, generalized Pain Descriptors / Indicators: Sore;Discomfort Pain Intervention(s): Limited activity within patient's tolerance;Monitored during session;Repositioned  Home Living                                          Prior Functioning/Environment              Frequency  Min 2X/week        Progress Toward Goals  OT Goals(current goals can now be found in the care plan section)  Progress towards OT goals: Progressing toward goals  Acute Rehab OT Goals Patient Stated Goal: to go back home independent OT Goal Formulation: With patient Time For Goal Achievement: 05/07/20 Potential to Achieve Goals: Fair ADL Goals Pt Will Perform Eating: with max assist;with adaptive utensils;with assist to don/doff brace/orthosis Pt Will Perform Grooming: with max assist;with adaptive equipment;sitting Additional ADL Goal #1: Pt will tolerate sitting OOB for 1 hour to progress ADL tolerance Additional ADL Goal #2: Pt will use UEs with hooking motion to assist in rolling for peri care at max A  Plan Discharge plan remains appropriate    Co-evaluation    PT/OT/SLP Co-Evaluation/Treatment: Yes Reason for Co-Treatment: Complexity of the patient's impairments (multi-system  involvement);For patient/therapist safety;To address functional/ADL transfers   OT goals addressed during session: ADL's and self-care      AM-PAC OT "6 Clicks" Daily Activity     Outcome Measure   Help from another person eating meals?: Total Help from another person taking care of personal grooming?: Total Help from another person toileting, which includes using toliet, bedpan, or urinal?: Total Help from another person bathing (including washing, rinsing, drying)?: Total Help from another person to put on and taking off regular upper body clothing?: Total Help from another person to put on and taking off regular lower body clothing?: Total 6 Click Score: 6    End of Session Equipment Utilized During Treatment: Cervical collar  OT Visit Diagnosis: Unsteadiness on feet (R26.81);Muscle weakness (generalized) (M62.81);Other symptoms and signs involving the nervous system (R29.898)   Activity Tolerance Patient tolerated treatment well   Patient Left in bed;with call bell/phone within reach   Nurse Communication Mobility status        Time: 0626-9485 OT Time Calculation (min): 26 min  Charges: OT General Charges $OT Visit: 1 Visit OT Treatments $Self Care/Home Management : 8-22 mins  Marcy Siren, OT Acute  Rehabilitation Services Pager [phone] Office [phone]    Orlando Penner 04/25/2020, 5:47 PM

## 2020-04-26 ENCOUNTER — Inpatient Hospital Stay (HOSPITAL_COMMUNITY): Payer: Medicare Other

## 2020-04-26 IMAGING — DX DG CERVICAL SPINE 1V
1 series · 1 of 1 positions shown · non-contrast
Comparison: Radiographs of the cervical spine [DATE].

CLINICAL DATA: Status post surgical spinal fusion.

EXAM:
DG CERVICAL SPINE - 1 VIEW

[c-spine lat]
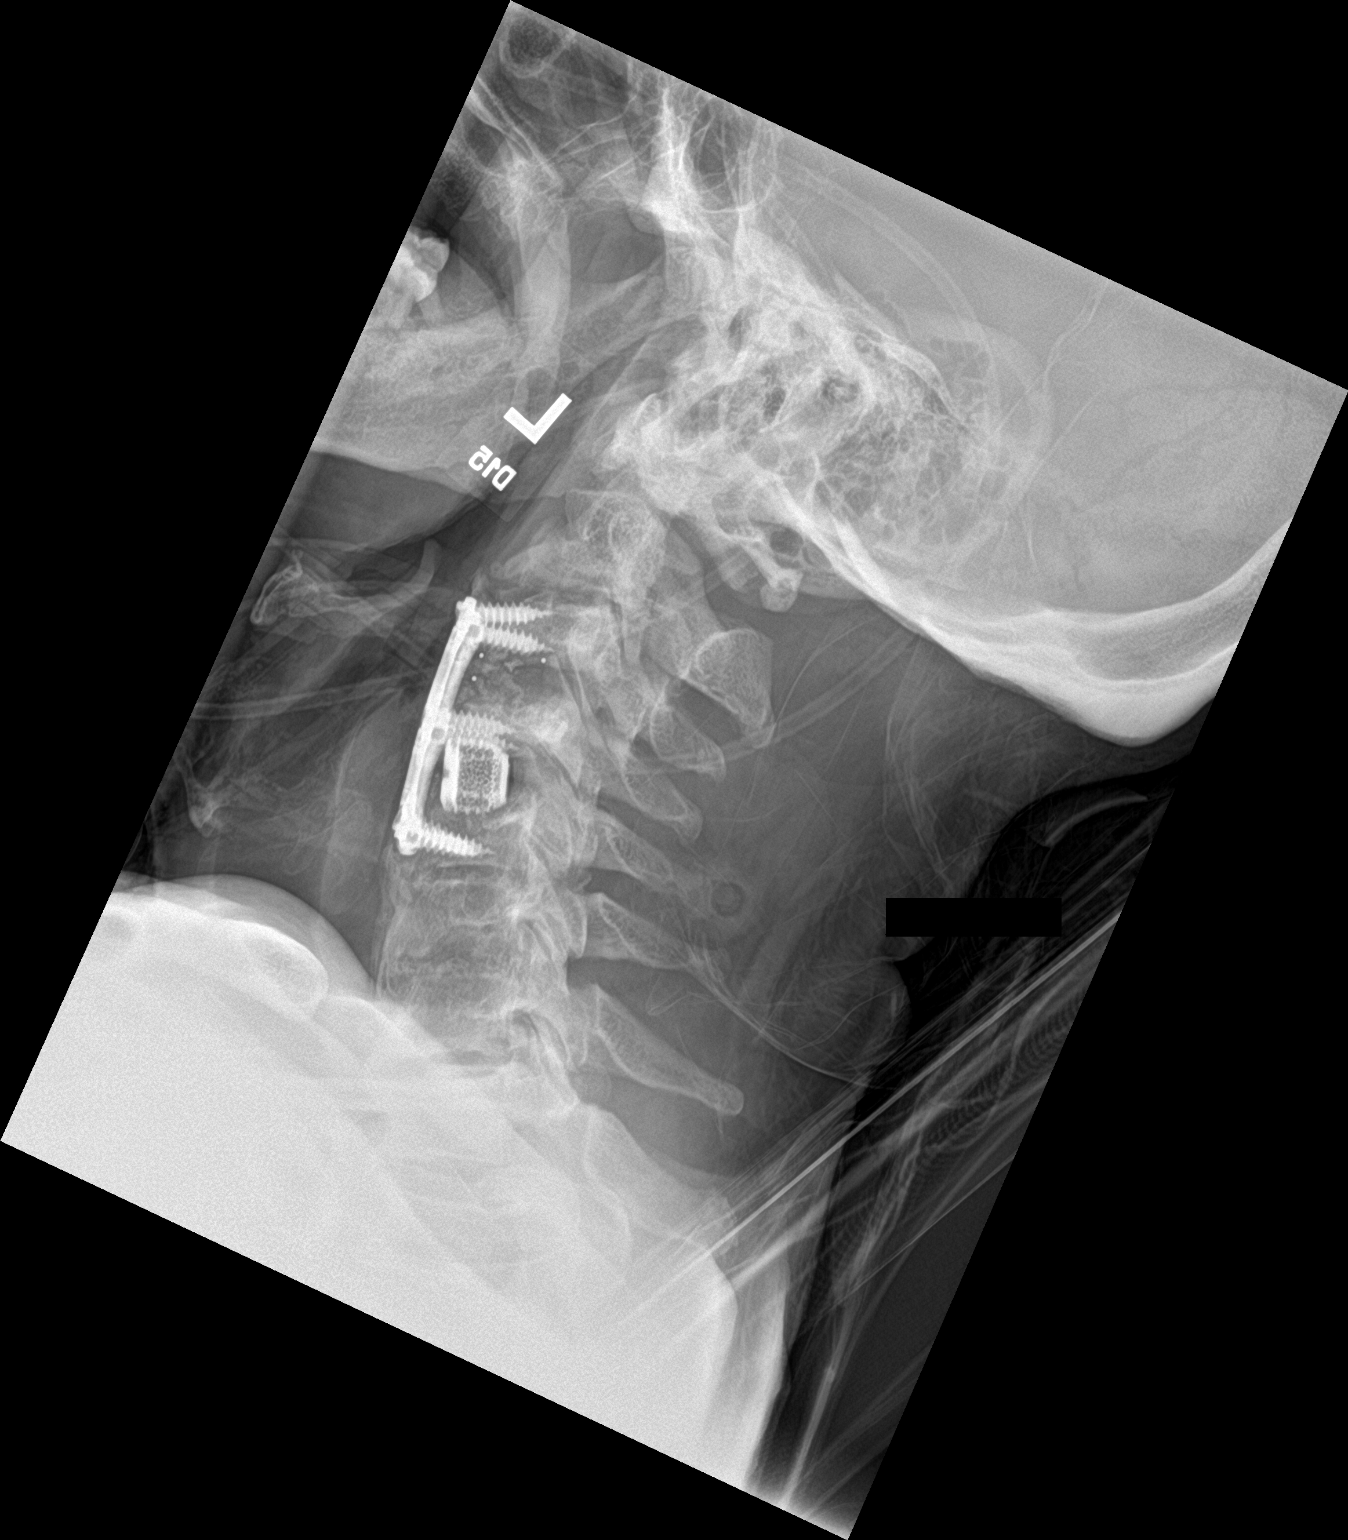

[1 of 1 positions shown; findings below may reference images not displayed]

FINDINGS: A single lateral view radiograph of the cervical spine is submitted.
Redemonstrated sequela of prior C3-C5 ACDF with ventral plate and
screw fixation. Redemonstrated interbody device at the C4-C5 level.
IMPRESSION: Single lateral view radiograph of the cervical spine as above.

## 2020-04-26 NOTE — Progress Notes (Addendum)
Providing Compassionate, Quality Care - Together   Subjective: Patient's niece is at the bedside during assessment. Patient reports weakness in his upper and lower extremities. He is inconsistent in describing his symptoms. Initially, he told me he was experiencing paresthesia in his upper extremities, but when reassessed, he denied numbness, tingling, or burning.  Objective: Vital signs in last 24 hours: Temp:  [97.5 F (36.4 C)-98.5 F (36.9 C)] 97.8 F (36.6 C) (11/03 1114) Pulse Rate:  [66-83] 66 (11/03 1114) Resp:  [2-19] 14 (11/03 1114) BP: (118-127)/(65-89) 124/65 (11/03 1114) SpO2:  [94 %-99 %] 97 % (11/03 1114)  Intake/Output from previous day: 11/02 0701 - 11/03 0700 In: 700 [P.O.:700] Out: 1925 [Urine:1925] Intake/Output this shift: Total I/O In: 635 [P.O.:635] Out: 400 [Urine:400]  Patient is alert. Appears to be oriented to person, place, and situation. PERRLA Right grip and tricep strength is approximately 4-/5. Left hand grip strength is approximately 2/5 Bilateral hip flexors 2/5, bilateral plantar flexion 4/5, bilateral dorsiflexion 4/5 Surgical wound covered with steri strips. Small amount of dried sanguinous drainage at surgical site   Lab Results: No results for input(s): WBC, HGB, HCT, PLT in the last 72 hours. BMET No results for input(s): NA, K, CL, CO2, GLUCOSE, BUN, CREATININE, CALCIUM in the last 72 hours.  Studies/Results: DG Swallowing Func-Speech Pathology  Result Date: 04/25/2020 Objective Swallowing Evaluation: Type of Study: MBS-Modified Barium Swallow Study  Patient Details Name: Shannon [name] MRN: [id number] Date of Birth: 1945/10/30 Today's Date: 04/25/2020 Time: SLP Start Time (ACUTE ONLY): 1043 -SLP Stop Time (ACUTE ONLY): 1057 SLP Time Calculation (min) (ACUTE ONLY): 14 min Past Medical History: Past Medical History: Diagnosis Date . Asthma  . 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  Quadraparesis, L>R, w/ cape-like  sensory deficit to pin prick (Dr. Newell Coral, 1992-->surg at [facility].  Summer 2021->Cervicalgia,arm pain, hand atrophy RUE wkness, hyperreflex (Dr. 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 Hay fever  . Hypertension  . Osteoarthritis of both hands  Past Surgical History: Past Surgical History: Procedure Laterality Date . ANTERIOR CERVICAL DECOMP/DISCECTOMY FUSION N/A 04/05/2020  Procedure: ANTERIOR CERVICAL DECOMPRESSION/DISCECTOMY FUSION, INTERBODY PROSTHESIS, PLATE/SCREWS CERVICAL THREE-CERVICAL FOUR, CERVICAL FOUR- CERVICAL FIVE;  Surgeon: Tressie Stalker, MD;  Location: [facility] OR;  Service: Neurosurgery;  Laterality: N/A; . ANTERIOR CERVICAL DECOMP/DISCECTOMY FUSION N/A 04/22/2020  Procedure: Reexploration of anterior cervical wound for epidural hematoma;  Surgeon: Donalee Citrin, MD;  Location: [facility] OR;  Service: Neurosurgery;  Laterality: N/A; . BACK SURGERY   . INCISION AND DRAINAGE Left 2012  L hand infection . ROTATOR CUFF REPAIR Right   x 3 . SPINE SURGERY   HPI: Pt is a 74 y.o. 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 Pt s/p evacuation of hemoatoma, removal of hardware and re-implantation of titanium cage packed with autograft bone on 04/22/20.  Pt in hard collar post op. CT head 10/29 negative for acute changes.  Subjective: alert, cooperative Assessment / Plan / Recommendation CHL IP CLINICAL IMPRESSIONS 04/25/2020 Clinical Impression Pt presents with a mild oropharyngeal swallow but with no aspiration or penetration observed. He has reduced posterior propulsion orally with mildly slow mastication and lingual rocking with solids. Lingual residue c/b a mild coating on the tongue  is also noted with  solids. His base of tongue retraction is mildly reduced, leading to mild vallecular residue with solids. His UES is opening also appears to be mildly reduced, which could be impacted by location of cervical hardware. Recommend continuing current diet with SLP f/u for advancement to more solid textures, which can be done clinically.  SLP Visit Diagnosis Dysphagia, oropharyngeal phase (R13.12) Attention and concentration deficit following -- Frontal lobe and executive function deficit following -- Impact on safety and function Mild aspiration risk   CHL IP TREATMENT RECOMMENDATION 04/25/2020 Treatment Recommendations Therapy as outlined in treatment plan below   Prognosis 04/25/2020 Prognosis for Safe Diet Advancement Good Barriers to Reach Goals Cognitive deficits Barriers/Prognosis Comment -- CHL IP DIET RECOMMENDATION 04/25/2020 SLP Diet Recommendations Dysphagia 1 (Puree) solids;Thin liquid Liquid Administration via Cup;Straw Medication Administration Whole meds with puree Compensations Slow rate;Small sips/bites Postural Changes Seated upright at 90 degrees   CHL IP OTHER RECOMMENDATIONS 04/25/2020 Recommended Consults -- Oral Care Recommendations Oral care BID Other Recommendations --   CHL IP FOLLOW UP RECOMMENDATIONS 04/25/2020 Follow up Recommendations Skilled Nursing facility   [facility] IP FREQUENCY AND DURATION 04/25/2020 Speech Therapy Frequency (ACUTE ONLY) min 2x/week Treatment Duration 2 weeks      CHL IP ORAL PHASE 04/25/2020 Oral Phase Impaired Oral - Pudding Teaspoon -- Oral - Pudding Cup -- Oral - Honey Teaspoon -- Oral - Honey Cup -- Oral - Nectar Teaspoon -- Oral - Nectar Cup -- Oral - Nectar Straw -- Oral - Thin Teaspoon -- Oral - Thin Cup Reduced posterior propulsion Oral - Thin Straw Reduced posterior propulsion Oral - Puree Reduced posterior propulsion;Delayed oral transit;Lingual/palatal residue Oral - Mech Soft Reduced posterior propulsion;Delayed oral transit;Lingual/palatal residue;Impaired  mastication Oral - Regular -- Oral - Multi-Consistency -- Oral - Pill Reduced posterior propulsion Oral Phase - Comment --  CHL IP PHARYNGEAL PHASE 04/25/2020 Pharyngeal Phase Impaired Pharyngeal- Pudding Teaspoon -- Pharyngeal -- Pharyngeal- Pudding Cup -- Pharyngeal -- Pharyngeal- Honey Teaspoon -- Pharyngeal -- Pharyngeal- Honey Cup -- Pharyngeal -- Pharyngeal- Nectar Teaspoon -- Pharyngeal -- Pharyngeal- Nectar Cup -- Pharyngeal -- Pharyngeal- Nectar Straw -- Pharyngeal -- Pharyngeal- Thin Teaspoon -- Pharyngeal -- Pharyngeal- Thin Cup Reduced tongue base retraction;Pharyngeal residue - valleculae Pharyngeal -- Pharyngeal- Thin Straw Reduced tongue base retraction;Pharyngeal residue - valleculae Pharyngeal -- Pharyngeal- Puree Reduced tongue base retraction;Pharyngeal residue - valleculae Pharyngeal -- Pharyngeal- Mechanical Soft Reduced tongue base retraction;Pharyngeal residue - valleculae Pharyngeal -- Pharyngeal- Regular -- Pharyngeal -- Pharyngeal- Multi-consistency -- Pharyngeal -- Pharyngeal- Pill Reduced tongue base retraction;Pharyngeal residue - valleculae Pharyngeal -- Pharyngeal Comment --  CHL IP CERVICAL ESOPHAGEAL PHASE 04/25/2020 Cervical Esophageal Phase Impaired Pudding Teaspoon -- Pudding Cup -- Honey Teaspoon -- Honey Cup -- Nectar Teaspoon -- Nectar Cup -- Nectar Straw -- Thin Teaspoon -- Thin Cup Reduced cricopharyngeal relaxation Thin Straw Reduced cricopharyngeal relaxation Puree Reduced cricopharyngeal relaxation Mechanical Soft Reduced cricopharyngeal relaxation Regular -- Multi-consistency -- Pill Reduced cricopharyngeal relaxation Cervical Esophageal Comment -- Mahala Menghini., M.A. CCC-SLP Acute Rehabilitation Services Pager [phone] Office [phone] 04/25/2020, 4:13 PM               Assessment/Plan: Patient is four days status post exploration of anterior cervical wound with hardware removal and new hardware placement due to damage caused by a fall. Patient's neurologic exam  appears to be slowly improving. Patient's situation is complicated due to dementia.   LOS: 4 days    -Continue therapies -Will get new lateral c-spine x-ray -I answered patient's niece's  questions while at the bedside   Val Eagle, DNP, AGNP-C Nurse Practitioner  [facility] [address], [address], [place], [place] [address] P: [phone]    F: [phone]  04/26/2020, 12:49 PM

## 2020-04-26 NOTE — TOC Progression Note (Signed)
Transition of Care [facility]) - Progression Note    Patient Details  Name: Shannon [name] MRN: [id number] Date of Birth: 03-05-1946  [facility]) CM/SW Contact  Eduard Roux, [place] Phone Number: 04/26/2020, 11:57 AM  Clinical Narrative:     CSW spoke with patient's daughter, [name],- informed of bed offers - she will call CSW back with choice.  CSW will continue to follow and assist with discharge planning.  Antony Blackbird, MSW, LCSWA Clinical Social Worker   Expected Discharge Plan: Skilled Nursing Facility Barriers to Discharge: Continued Medical Work up  Expected Discharge Plan and Services Expected Discharge Plan: Skilled Nursing Facility       Living arrangements for the past 2 months: Single Family Home                                       Social Determinants of Health (SDOH) Interventions    Readmission Risk Interventions No flowsheet data found.

## 2020-04-27 LAB — SARS CORONAVIRUS 2 BY RT PCR (HOSPITAL ORDER, PERFORMED IN ~~LOC~~ HOSPITAL LAB): SARS Coronavirus 2: NEGATIVE

## 2020-04-27 MED ORDER — TAMSULOSIN HCL 0.4 MG PO CAPS
0.4000 mg | ORAL_CAPSULE | Freq: Every day | ORAL | Status: DC
  Administered 2020-04-27 – 2020-04-28 (×2): 0.4 mg via ORAL
  Filled 2020-04-27 (×2): qty 1

## 2020-04-27 NOTE — TOC Progression Note (Signed)
Transition of Care [facility]) - Progression Note    Patient Details  Name: Shannon [name] MRN: [id number] Date of Birth: 1945/09/28  Transition of Care [facility]) CM/SW Contact  Eduard Roux, [place] Phone Number: 04/27/2020, 12:38 PM  Clinical Narrative:     [facility] confirmed bed offer and availability- SNF can admit patient tomorrow if medically stable.   RN updated and covid test requested.  Antony Blackbird, MSW, LCSWA Clinical Social Worker   Expected Discharge Plan: Skilled Nursing Facility Barriers to Discharge: Continued Medical Work up  Expected Discharge Plan and Services Expected Discharge Plan: Skilled Nursing Facility       Living arrangements for the past 2 months: Single Family Home                                       Social Determinants of Health (SDOH) Interventions    Readmission Risk Interventions No flowsheet data found.

## 2020-04-27 NOTE — Progress Notes (Signed)
Physical Therapy Treatment Patient Details Name: Shannon [name] MRN: [id number] DOB: 01/01/1946 Today's Date: 04/27/2020    History of Present Illness 74 y.o. 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  Pt in hard collar post op.  Pt with significant PMH of admission to hospital for cervical fusion from 10/13-10/15/21 and then d/c to inpatient rehab from 10/15-10/27/21.  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      PT Comments    The pt is progressing well with mobility tolerance and PT goals at this time. He was able to tolerate sitting EOB for ~20 min and completed LE exercises during that time to improve seated core stability and LE strength. The pt does continue to require maxA to maintain seated balance despite propping of UE for support at this time. The pt will continue to benefit from skilled PT to progress OOB tolerance and continued LE and core strengthening.     Follow Up Recommendations  SNF     Equipment Recommendations  Wheelchair (measurements PT);Wheelchair cushion (measurements PT);Hospital bed;Other (comment) (hoyer lift)    Recommendations for Other Services       Precautions / Restrictions Precautions Precautions: Fall;Cervical Precaution Booklet Issued: Yes (comment) Precaution Comments: log roll in and out of bed Required Braces or Orthoses: Cervical Brace Cervical Brace: Hard collar;At all times Restrictions Weight Bearing Restrictions: No    Mobility  Bed Mobility Overal bed mobility: Needs Assistance Bed Mobility: Rolling;Sidelying to Sit;Sit to Sidelying Rolling: Max assist Sidelying to sit: Total assist;+2 for safety/equipment;HOB elevated   Sit to supine: Total assist;+2 for safety/equipment;+2 for physical  assistance   General bed mobility comments: max- total A +2 for bed mobility  Transfers                 General transfer comment: unable      Balance Overall balance assessment: Needs assistance Sitting-balance support: Feet supported;Bilateral upper extremity supported Sitting balance-Leahy Scale: Zero Sitting balance - Comments: max posterior assist to maintain upright sitting despite placement of BUE on bed to facilitate UE support Postural control: Posterior lean                                  Cognition Arousal/Alertness: Awake/alert (initially sleepy, pt answering questions) Behavior During Therapy: WFL for tasks assessed/performed;Flat affect Overall Cognitive Status: No family/caregiver present to determine baseline cognitive functioning Area of Impairment: Attention;Memory;Following commands;Safety/judgement;Problem solving                   Current Attention Level: Selective Memory: Decreased recall of precautions;Decreased short-term memory Following Commands: Follows one step commands with increased time Safety/Judgement: Decreased awareness of deficits;Decreased awareness of safety   Problem Solving: Slow processing;Requires verbal cues;Requires tactile cues General Comments: Flat affect and minimal verbalizations today, pt friend present and stating this lack of talking is abnormal for the pt. Pt remained pleasant and cooperative through sesion      Exercises General Exercises - Lower Extremity Ankle Circles/Pumps: AROM;Both;10 reps;Seated Long Arc Quad: AAROM;Both;5 reps;Seated Heel Raises: AROM;Both;5 reps;Supine    General Comments General comments (skin integrity, edema, etc.): VSS on RA, pt reports mild dizziness with no change in BP      Pertinent Vitals/Pain Pain Assessment:  Faces Faces Pain Scale: Hurts little more Pain Location: neck, generalized at end of session Pain Descriptors / Indicators: Sore;Discomfort Pain  Intervention(s): Limited activity within patient's tolerance;Monitored during session;Patient requesting pain meds-RN notified           PT Goals (current goals can now be found in the care plan section) Acute Rehab PT Goals Patient Stated Goal: not stated this session PT Goal Formulation: With patient Time For Goal Achievement: 05/06/20 Potential to Achieve Goals: Fair Progress towards PT goals: Progressing toward goals (slowly)    Frequency    Min 3X/week      PT Plan Current plan remains appropriate    Co-evaluation PT/OT/SLP Co-Evaluation/Treatment: Yes Reason for Co-Treatment: Complexity of the patient's impairments (multi-system involvement);Necessary to address cognition/behavior during functional activity;For patient/therapist safety;To address functional/ADL transfers PT goals addressed during session: Mobility/safety with mobility;Balance;Strengthening/ROM OT goals addressed during session: ADL's and self-care      AM-PAC PT "6 Clicks" Mobility   Outcome Measure  Help needed turning from your back to your side while in a flat bed without using bedrails?: Total Help needed moving from lying on your back to sitting on the side of a flat bed without using bedrails?: Total Help needed moving to and from a bed to a chair (including a wheelchair)?: Total Help needed standing up from a chair using your arms (e.g., wheelchair or bedside chair)?: Total Help needed to walk in hospital room?: Total Help needed climbing 3-5 steps with a railing? : Total 6 Click Score: 6    End of Session Equipment Utilized During Treatment: Cervical collar Activity Tolerance: Patient limited by fatigue Patient left: in bed;with call bell/phone within reach;with family/visitor present Nurse Communication: Mobility status PT Visit Diagnosis: Muscle weakness (generalized) (M62.81);Difficulty in walking, not elsewhere classified (R26.2);Other symptoms and signs involving the nervous system  (R29.898);Other (comment)     Time: 8469-6295 PT Time Calculation (min) (ACUTE ONLY): 34 min  Charges:  $Therapeutic Exercise: 8-22 mins                     Rolm Baptise, PT, DPT   Acute Rehabilitation Department Pager #: [phone]   Gaetana Michaelis 04/27/2020, 3:34 PM

## 2020-04-27 NOTE — Progress Notes (Signed)
Occupational Therapy Treatment Patient Details Name: Shannon [name] MRN: [id number] DOB: 01-02-1946 Today's Date: 04/27/2020    History of present illness 74 y.o. 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  Pt in hard collar post op.  Pt with significant PMH of admission to hospital for cervical fusion from 10/13-10/15/21 and then d/c to inpatient rehab from 10/15-10/27/21.  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     OT comments  Pt progressing slow but steady towards acute OT goals. Motivated to work with therapy. Tolerated sitting EOB with total assist for around 10 minutes with LB exercises incorporated. Pt left with bed in chair position, bed alarm on, and friend present. D/c plan remains appropriate.    Follow Up Recommendations  SNF    Equipment Recommendations  Other (comment) (defer to next venue)    Recommendations for Other Services      Precautions / Restrictions Precautions Precautions: Fall;Cervical Precaution Booklet Issued: Yes (comment) Required Braces or Orthoses: Cervical Brace Cervical Brace: Hard collar;At all times Restrictions Weight Bearing Restrictions: No       Mobility Bed Mobility Overal bed mobility: Needs Assistance             General bed mobility comments: max- total A +2 for bed mobility  Transfers                 General transfer comment: unable    Balance Overall balance assessment: Needs assistance Sitting-balance support: Feet supported;Bilateral upper extremity supported Sitting balance-Leahy Scale: Zero                                     ADL either performed or assessed with clinical judgement   ADL Overall ADL's : Needs assistance/impaired                                        General ADL Comments: Appears to have better muscle strength/range in BLE than BUE though remains at a level of needing max - total A with all ADLs. Zero sitting balance but able to tolerate sitting with total A EOB for 5+ minutes with BLE excercises incoporated.     Vision       Perception     Praxis      Cognition Arousal/Alertness: Awake/alert (a little sleepy, eyes closed but answering questions in bed) Behavior During Therapy: WFL for tasks assessed/performed;Flat affect Overall Cognitive Status: No family/caregiver present to determine baseline cognitive functioning Area of Impairment: Attention;Memory;Following commands;Safety/judgement;Problem solving                   Current Attention Level: Selective Memory: Decreased recall of precautions;Decreased short-term memory Following Commands: Follows one step commands with increased time Safety/Judgement: Decreased awareness of deficits;Decreased awareness of safety   Problem Solving: Slow processing;Requires verbal cues;Requires tactile cues          Exercises     Shoulder Instructions       General Comments Friend present throughout session and providing encouragement to pt.     Pertinent Vitals/ Pain       Pain Assessment: Faces Faces Pain Scale:  Hurts even more Pain Location: neck, generalized at end of session Pain Descriptors / Indicators: Sore;Discomfort Pain Intervention(s): Patient requesting pain meds-RN notified;Repositioned;Monitored during session;Limited activity within patient's tolerance  Home Living                                          Prior Functioning/Environment              Frequency  Min 2X/week        Progress Toward Goals  OT Goals(current goals can now be found in the care plan section)  Progress towards OT goals: Progressing toward goals  Acute Rehab OT Goals Patient Stated Goal: not stated this session OT  Goal Formulation: With patient Time For Goal Achievement: 05/07/20 Potential to Achieve Goals: Fair ADL Goals Pt Will Perform Eating: with max assist;with adaptive utensils;with assist to don/doff brace/orthosis Pt Will Perform Grooming: with max assist;with adaptive equipment;sitting Pt Will Perform Upper Body Dressing: sitting;with modified independence Pt Will Perform Lower Body Dressing: sitting/lateral leans;sit to/from stand;with modified independence Pt Will Transfer to Toilet: with modified independence;regular height toilet Pt Will Perform Toileting - Clothing Manipulation and hygiene: with modified independence;with adaptive equipment;sitting/lateral leans;sit to/from stand Additional ADL Goal #1: Pt will tolerate sitting OOB for 1 hour to progress ADL tolerance Additional ADL Goal #2: Pt will use UEs with hooking motion to assist in rolling for peri care at max A  Plan Discharge plan remains appropriate    Co-evaluation    PT/OT/SLP Co-Evaluation/Treatment: Yes Reason for Co-Treatment: Complexity of the patient's impairments (multi-system involvement);Necessary to address cognition/behavior during functional activity;For patient/therapist safety;To address functional/ADL transfers   OT goals addressed during session: ADL's and self-care      AM-PAC OT "6 Clicks" Daily Activity     Outcome Measure   Help from another person eating meals?: Total Help from another person taking care of personal grooming?: Total Help from another person toileting, which includes using toliet, bedpan, or urinal?: Total Help from another person bathing (including washing, rinsing, drying)?: Total Help from another person to put on and taking off regular upper body clothing?: Total Help from another person to put on and taking off regular lower body clothing?: Total 6 Click Score: 6    End of Session Equipment Utilized During Treatment: Cervical collar  OT Visit Diagnosis: Unsteadiness on  feet (R26.81);Muscle weakness (generalized) (M62.81);Other symptoms and signs involving the nervous system (R29.898)   Activity Tolerance Patient tolerated treatment well   Patient Left in bed;with call bell/phone within reach   Nurse Communication Patient requests pain meds        Time: 8299-3716 OT Time Calculation (min): 27 min  Charges: OT General Charges $OT Visit: 1 Visit OT Treatments $Self Care/Home Management : 8-22 mins  Raynald Kemp, OT Acute Rehabilitation Services Pager: [phone] Office: [phone]   Pilar Grammes 04/27/2020, 2:23 PM

## 2020-04-27 NOTE — TOC Progression Note (Signed)
Transition of Care [facility]) - Progression Note    Patient Details  Name: Shannon [name] MRN: [id number] Date of Birth: May 29, 1946  Transition of Care [facility]) CM/SW Contact  Eduard Roux, [place] Phone Number: 04/27/2020, 11:16 AM  Clinical Narrative:     CSW spoke with patient's daughter,[name]- she selected [facility].   CSW contacted [facility] to confirm bed offer and availability-CSW waiting on response.  Antony Blackbird, MSW, LCSWA Clinical Social Worker   Expected Discharge Plan: Skilled Nursing Facility Barriers to Discharge: Continued Medical Work up  Expected Discharge Plan and Services Expected Discharge Plan: Skilled Nursing Facility       Living arrangements for the past 2 months: Single Family Home                                       Social Determinants of Health (SDOH) Interventions    Readmission Risk Interventions No flowsheet data found.

## 2020-04-27 NOTE — Progress Notes (Signed)
Providing Compassionate, Quality Care - Together   Subjective: Patient reports no issues. He denies pain. He appears to be resting comfortably.   Objective: Vital signs in last 24 hours: Temp:  [97.5 F (36.4 C)-98.2 F (36.8 C)] 97.5 F (36.4 C) (11/04 0730) Pulse Rate:  [66-93] 71 (11/04 0730) Resp:  [14-22] 22 (11/04 0730) BP: (123-134)/(65-79) 134/79 (11/04 0730) SpO2:  [92 %-97 %] 94 % (11/04 0730)  Intake/Output from previous day: 11/03 0701 - 11/04 0700 In: 835 [P.O.:835] Out: 1650 [Urine:1650] Intake/Output this shift: No intake/output data recorded.  Patient is alert. Appears to be oriented to person, place, and situation. PERRLA Right grip and tricep strength is approximately 4-/5. Left hand grip strength is approximately 2/5 Bilateral hip flexors 2/5, bilateral plantar flexion 4/5, bilateral dorsiflexion 4/5 Surgical wound covered with steri strips. Small amount of dried sanguinous drainage at surgical site  Lab Results: No results for input(s): WBC, HGB, HCT, PLT in the last 72 hours. BMET No results for input(s): NA, K, CL, CO2, GLUCOSE, BUN, CREATININE, CALCIUM in the last 72 hours.  Studies/Results: DG Cervical Spine 1 View  Result Date: 04/26/2020 CLINICAL DATA:  Status post surgical spinal fusion. EXAM: DG CERVICAL SPINE - 1 VIEW COMPARISON:  Radiographs of the cervical spine 04/22/2020. FINDINGS: A single lateral view radiograph of the cervical spine is submitted. Redemonstrated sequela of prior C3-C5 ACDF with ventral plate and screw fixation. Redemonstrated interbody device at the C4-C5 level. IMPRESSION: Single lateral view radiograph of the cervical spine as above. Electronically Signed   By: Jackey Loge DO   On: 04/26/2020 14:58   DG Swallowing Func-Speech Pathology  Result Date: 04/25/2020 Objective Swallowing Evaluation: Type of Study: MBS-Modified Barium Swallow Study  Patient Details Name: LUISFERNANDO BRIGHTWELL MRN: [id number] Date of Birth:  1945/08/23 Today's Date: 04/25/2020 Time: SLP Start Time (ACUTE ONLY): 1043 -SLP Stop Time (ACUTE ONLY): 1057 SLP Time Calculation (min) (ACUTE ONLY): 14 min Past Medical History: Past Medical History: Diagnosis Date . Asthma  . 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 Newell Coral, 1992-->surg at [facility].  Summer 2021->Cervicalgia,arm pain, hand atrophy RUE wkness, hyperreflex (Dr. 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 Hay fever  . Hypertension  . Osteoarthritis of both hands  Past Surgical History: Past Surgical History: Procedure Laterality Date . ANTERIOR CERVICAL DECOMP/DISCECTOMY FUSION N/A 04/05/2020  Procedure: ANTERIOR CERVICAL DECOMPRESSION/DISCECTOMY FUSION, INTERBODY PROSTHESIS, PLATE/SCREWS CERVICAL THREE-CERVICAL FOUR, CERVICAL FOUR- CERVICAL FIVE;  Surgeon: Tressie Stalker, MD;  Location: [facility] OR;  Service: Neurosurgery;  Laterality: N/A; . ANTERIOR CERVICAL DECOMP/DISCECTOMY FUSION N/A 04/22/2020  Procedure: Reexploration of anterior cervical wound for epidural hematoma;  Surgeon: Donalee Citrin, MD;  Location: [facility] OR;  Service: Neurosurgery;  Laterality: N/A; . BACK SURGERY   . INCISION AND DRAINAGE Left 2012  L hand infection . ROTATOR CUFF REPAIR Right   x 3 . SPINE SURGERY   HPI: Pt is a 74 y.o. 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 Pt s/p evacuation of hemoatoma, removal of hardware and re-implantation of titanium cage packed with autograft bone on 04/22/20.  Pt in hard collar post op. CT head 10/29 negative for acute changes.  Subjective: alert, cooperative Assessment / Plan / Recommendation CHL IP  CLINICAL IMPRESSIONS 04/25/2020 Clinical Impression Pt presents with a mild oropharyngeal swallow but with no aspiration or penetration observed. He has reduced posterior propulsion orally with mildly slow mastication and lingual rocking with solids. Lingual residue c/b a mild coating on the tongue is also noted with solids. His base of tongue retraction is mildly reduced, leading to mild vallecular residue with solids. His UES is opening also appears to be mildly reduced, which could be impacted by location of cervical hardware. Recommend continuing current diet with SLP f/u for advancement to more solid textures, which can be done clinically.  SLP Visit Diagnosis Dysphagia, oropharyngeal phase (R13.12) Attention and concentration deficit following -- Frontal lobe and executive function deficit following -- Impact on safety and function Mild aspiration risk   CHL IP TREATMENT RECOMMENDATION 04/25/2020 Treatment Recommendations Therapy as outlined in treatment plan below   Prognosis 04/25/2020 Prognosis for Safe Diet Advancement Good Barriers to Reach Goals Cognitive deficits Barriers/Prognosis Comment -- CHL IP DIET RECOMMENDATION 04/25/2020 SLP Diet Recommendations Dysphagia 1 (Puree) solids;Thin liquid Liquid Administration via Cup;Straw Medication Administration Whole meds with puree Compensations Slow rate;Small sips/bites Postural Changes Seated upright at 90 degrees   CHL IP OTHER RECOMMENDATIONS 04/25/2020 Recommended Consults -- Oral Care Recommendations Oral care BID Other Recommendations --   CHL IP FOLLOW UP RECOMMENDATIONS 04/25/2020 Follow up Recommendations Skilled Nursing facility   [facility] IP FREQUENCY AND DURATION 04/25/2020 Speech Therapy Frequency (ACUTE ONLY) min 2x/week Treatment Duration 2 weeks      CHL IP ORAL PHASE 04/25/2020 Oral Phase Impaired Oral - Pudding Teaspoon -- Oral - Pudding Cup -- Oral - Honey Teaspoon -- Oral - Honey Cup -- Oral - Nectar Teaspoon -- Oral - Nectar Cup -- Oral - Nectar  Straw -- Oral - Thin Teaspoon -- Oral - Thin Cup Reduced posterior propulsion Oral - Thin Straw Reduced posterior propulsion Oral - Puree Reduced posterior propulsion;Delayed oral transit;Lingual/palatal residue Oral - Mech Soft Reduced posterior propulsion;Delayed oral transit;Lingual/palatal residue;Impaired mastication Oral - Regular -- Oral - Multi-Consistency -- Oral - Pill Reduced posterior propulsion Oral Phase - Comment --  CHL IP PHARYNGEAL PHASE 04/25/2020 Pharyngeal Phase Impaired Pharyngeal- Pudding Teaspoon -- Pharyngeal -- Pharyngeal- Pudding Cup -- Pharyngeal -- Pharyngeal- Honey Teaspoon -- Pharyngeal -- Pharyngeal- Honey Cup -- Pharyngeal -- Pharyngeal- Nectar Teaspoon -- Pharyngeal -- Pharyngeal- Nectar Cup -- Pharyngeal -- Pharyngeal- Nectar Straw -- Pharyngeal -- Pharyngeal- Thin Teaspoon -- Pharyngeal -- Pharyngeal- Thin Cup Reduced tongue base retraction;Pharyngeal residue - valleculae Pharyngeal -- Pharyngeal- Thin Straw Reduced tongue base retraction;Pharyngeal residue - valleculae Pharyngeal -- Pharyngeal- Puree Reduced tongue base retraction;Pharyngeal residue - valleculae Pharyngeal -- Pharyngeal- Mechanical Soft Reduced tongue base retraction;Pharyngeal residue - valleculae Pharyngeal -- Pharyngeal- Regular -- Pharyngeal -- Pharyngeal- Multi-consistency -- Pharyngeal -- Pharyngeal- Pill Reduced tongue base retraction;Pharyngeal residue - valleculae Pharyngeal -- Pharyngeal Comment --  CHL IP CERVICAL ESOPHAGEAL PHASE 04/25/2020 Cervical Esophageal Phase Impaired Pudding Teaspoon -- Pudding Cup -- Honey Teaspoon -- Honey Cup -- Nectar Teaspoon -- Nectar Cup -- Nectar Straw -- Thin Teaspoon -- Thin Cup Reduced cricopharyngeal relaxation Thin Straw Reduced cricopharyngeal relaxation Puree Reduced cricopharyngeal relaxation Mechanical Soft Reduced cricopharyngeal relaxation Regular -- Multi-consistency -- Pill Reduced cricopharyngeal relaxation Cervical Esophageal Comment -- Mahala Menghini., M.A.  CCC-SLP Acute Rehabilitation Services Pager [phone] Office [phone] 04/25/2020, 4:13 PM               Assessment/Plan: Patient is four days status post exploration  of anterior cervical wound with hardware removal and new hardware placement due to damage caused by a fall. Patient's neurologic exam appears to be slowly improving. Patient's situation is complicated due to dementia. Lateral c-spine x-ray completed on 04/26/2020 shows stable appearance of C3-5 ACDF. Will continue cervical hard collar. No posterior fixation planned at this time. Continue to mobilize with therapies. Plan is for SNF at discharge.   LOS: 5 days     Val Eagle, DNP, AGNP-C Nurse Practitioner  [facility] [address], [place], [place] [address] P: [phone]    F: [phone]  04/27/2020, 9:57 AM

## 2020-04-27 NOTE — Progress Notes (Signed)
  Speech Language Pathology Treatment: Dysphagia  Patient Details Name: Shannon [name] MRN: [id number] DOB: 10/03/1945 Today's Date: 04/27/2020 Time: 0921-0936 SLP Time Calculation (min) (ACUTE ONLY): 15 min  Assessment / Plan / Recommendation Clinical Impression  Pt shows no overt s/s of aspiration or dysphagia with current diet level, but is still not eating much of his meals. He seems to drink more than he eats, drinking an entire Boost fairly quickly. SLP provided advanced trials of regular solids as pt requested graham crackers wtih peanut butter. SLP provided small bites at a time and although pt took extra time to [facility], he was able to clear his mouth appropriately and he initiated requests for liquid washes PRN. It may take some time for him to get through meals, but he also may have more foods that he will eat if we advance his diet. Will upgrade to Dys 3 solids and thin liquids, still with full assist during meals.   HPI HPI: Pt is a 74 y.o. 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 Pt s/p evacuation of hemoatoma, removal of hardware and re-implantation of titanium cage packed with autograft bone on 04/22/20.  Pt in hard collar post op. CT head 10/29 negative for acute changes.       SLP Plan  Continue with current plan of care       Recommendations  Diet recommendations: Dysphagia 3 (mechanical soft);Thin liquid Liquids provided via: Straw;Cup Medication Administration: Crushed with puree Supervision: Staff to assist with self feeding Compensations: Slow rate;Small sips/bites Postural Changes and/or Swallow Maneuvers: Seated upright 90 degrees                Oral Care Recommendations: Oral care BID Follow  up Recommendations: Skilled Nursing facility SLP Visit Diagnosis: Dysphagia, oropharyngeal phase (R13.12) Plan: Continue with current plan of care       GO                Shannon [name]., M.A. CCC-SLP Acute Rehabilitation Services Pager [phone] Office [phone]  04/27/2020, 9:36 AM

## 2020-04-28 MED ORDER — TAMSULOSIN HCL 0.4 MG PO CAPS: 0.4000 mg | ORAL_CAPSULE | Freq: Every day | ORAL | 1 refills | Status: AC

## 2020-04-28 NOTE — Progress Notes (Signed)
PTAR at bedside, IV taken out of pt, AVS and medication instructions in packet for facility, pt belongings on stretcher

## 2020-04-28 NOTE — TOC Transition Note (Signed)
Transition of Care [facility]) - CM/SW Discharge Note   Patient Details  Name: Shannon [name] MRN: [id number] Date of Birth: [birth year]  [facility] of [facility]) CM/SW Contact:  Shannon [name], LCSWA Phone Number: 04/28/2020, 11:00 AM   Clinical Narrative:     Patient will DC to: [facility] DC Date: 04/28/2020 Family Notified:[name],daughter Transport By: Sharin Mons   Per MD patient is ready for discharge. RN, patient, and facility notified of DC. Discharge Summary sent to facility. RN given number for [phone],room [phone]. Ambulance transport requested for patient.   Clinical Social Worker signing off.  Antony Blackbird, MSW, LCSWA Clinical Social Worker   Final next level of care: Skilled Nursing Facility Barriers to Discharge: Barriers Resolved   Patient Goals and CMS Choice Patient states their goals for this hospitalization and ongoing recovery are:: To regain ability to live/care for self CMS Medicare.gov Compare Post Acute Care list provided to:: Patient Choice offered to / list presented to : Patient, Adult Children (Belli,[name] Daughter   [phone])  Discharge Placement              Patient chooses bed at: [facility] Patient to be transferred to facility by: PTAR Name of family member notified: [name],daughter Patient and family notified of of transfer: 04/28/20  Discharge Plan and Services                                     Social Determinants of Health (SDOH) Interventions     Readmission Risk Interventions No flowsheet data found.

## 2020-04-28 NOTE — Progress Notes (Signed)
Report called to Francesca Jewett at [facility]

## 2020-04-28 NOTE — Discharge Summary (Signed)
Physician Discharge Summary  Patient ID: Shannon [name] MRN: [id number] DOB/AGE: 1945-08-11 74 y.o.  Admit date: 04/21/2020 Discharge date: 04/28/2020  Admission Diagnoses: Cervical fracture, cervical epidural hematoma, Quadraparesis, central cord syndrome  Discharge Diagnoses: The same Active Problems:   Spinal stenosis in cervical region   Myelopathy [facility])   Discharged Condition: fair  Hospital Course: The patient was readmitted 13 days after a two-level anterior cervical discectomy fusion and plating with dense Quadraparesis.  He is on Eliquis and took a fall suffering a cervical fracture and cervical epidural hematoma.  Dr. Wynetta Emery performed a revision of his fusion and evacuation of epidural hematoma.  The patient's postoperative course was relatively unremarkable.  He has worked with PT and OT.  He has slowly regained strength in his legs.  His arms remain weak consistent with a central cord syndrome.  Arrangements were made for skilled nursing facility placement.  He was transferred on 04/28/2020.  He is to continue PT and OT and likely have a voiding trial at the skilled nursing facility.  He should follow-up with me in 3 weeks if possible.  Consults: PT, OT, care management Significant Diagnostic Studies: Cervical CT, cervical MRI Treatments: Evacuation of cervical hematoma, revision of anterior cervical fusion Discharge Exam: Blood pressure 120/66, pulse 91, temperature 97.9 F (36.6 C), temperature source Oral, resp. rate 14, height 5\' 10"  (1.778 m), weight 91 kg, SpO2 96 %. The patient is alert and pleasant.  He denies pain.  His lower extremity strength is approximately 4/5 throughout.  His hand grip strength is approximately 2-3 over 5.  His wound is healing well without signs of infection, hematoma, shift, etc.   Disposition: Skilled nursing facility  Discharge Instructions    Call MD for:  difficulty breathing, headache or visual disturbances   Complete by: As directed     Call MD for:  extreme fatigue   Complete by: As directed    Call MD for:  hives   Complete by: As directed    Call MD for:  persistant dizziness or light-headedness   Complete by: As directed    Call MD for:  persistant nausea and vomiting   Complete by: As directed    Call MD for:  redness, tenderness, or signs of infection (pain, swelling, redness, odor or green/yellow discharge around incision site)   Complete by: As directed    Call MD for:  severe uncontrolled pain   Complete by: As directed    Call MD for:  temperature >100.4   Complete by: As directed    Diet - low sodium heart healthy   Complete by: As directed    Discharge instructions   Complete by: As directed    Call [phone] for a followup appointment. Take a stool softener while you are using pain medications.   Driving Restrictions   Complete by: As directed    Do not drive for 2 weeks.   Increase activity slowly   Complete by: As directed    Keep the cervical hard collar on at all times until I see him back in the office.  Continue PT and OT.   Lifting restrictions   Complete by: As directed    Do not lift more than 5 pounds. No excessive bending or twisting.   May shower / Bathe   Complete by: As directed    Remove the dressing for 3 days after surgery.  You may shower, but leave the incision alone.   No dressing needed  Complete by: As directed      Allergies as of 04/28/2020      Reactions   Iodine Swelling   Penicillins Swelling   Facial swelling, itchy throat      Medication List    TAKE these medications   amLODipine 10 MG tablet Commonly known as: NORVASC Take 1 tablet (10 mg total) by mouth daily.   apixaban 2.5 MG Tabs tablet Commonly known as: ELIQUIS Take 1 tablet (2.5 mg total) by mouth 2 (two) times daily.   docusate sodium 100 MG capsule Commonly known as: COLACE Take 1 capsule (100 mg total) by mouth 2 (two) times daily.   pantoprazole 40 MG tablet Commonly known as:  PROTONIX Take 1 tablet (40 mg total) by mouth at bedtime.   tamsulosin 0.4 MG Caps capsule Commonly known as: FLOMAX Take 1 capsule (0.4 mg total) by mouth daily.            Discharge Care Instructions  (From admission, onward)         Start     Ordered   04/28/20 0000  No dressing needed        04/28/20 5176           Signed: Cristi Loron 04/28/2020, 7:30 AM

## 2020-04-28 NOTE — Progress Notes (Signed)
Attempted to call [facility] 3 times with no answer. This RN left voicemail with name and phone number, will try to call report again

## 2020-05-02 ENCOUNTER — Encounter: Payer: Medicare Other | Admitting: Registered Nurse

## 2020-05-03 ENCOUNTER — Inpatient Hospital Stay (HOSPITAL_COMMUNITY)
Admission: EM | Admit: 2020-05-03 | Discharge: 2020-05-11 | DRG: 698 | Disposition: A | Discharge status: Skilled Nursing Facility | Payer: Medicare Other | Source: Skilled Nursing Facility | Attending: Internal Medicine | Admitting: Internal Medicine

## 2020-05-03 ENCOUNTER — Inpatient Hospital Stay (HOSPITAL_COMMUNITY): Payer: Medicare Other

## 2020-05-03 ENCOUNTER — Emergency Department (HOSPITAL_COMMUNITY): Payer: Medicare Other

## 2020-05-03 ENCOUNTER — Other Ambulatory Visit: Payer: Self-pay

## 2020-05-03 ENCOUNTER — Encounter (HOSPITAL_COMMUNITY): Payer: Self-pay

## 2020-05-03 DIAGNOSIS — Z6825 Body mass index (BMI) 25.0-25.9, adult: Secondary | ICD-10-CM

## 2020-05-03 DIAGNOSIS — N400 Enlarged prostate without lower urinary tract symptoms: Secondary | ICD-10-CM | POA: Diagnosis present

## 2020-05-03 DIAGNOSIS — W19XXXS Unspecified fall, sequela: Secondary | ICD-10-CM | POA: Diagnosis present

## 2020-05-03 DIAGNOSIS — M19042 Primary osteoarthritis, left hand: Secondary | ICD-10-CM | POA: Diagnosis present

## 2020-05-03 DIAGNOSIS — J690 Pneumonitis due to inhalation of food and vomit: Secondary | ICD-10-CM | POA: Insufficient documentation

## 2020-05-03 DIAGNOSIS — E870 Hyperosmolality and hypernatremia: Secondary | ICD-10-CM | POA: Diagnosis present

## 2020-05-03 DIAGNOSIS — E43 Unspecified severe protein-calorie malnutrition: Secondary | ICD-10-CM | POA: Diagnosis present

## 2020-05-03 DIAGNOSIS — Z7901 Long term (current) use of anticoagulants: Secondary | ICD-10-CM

## 2020-05-03 DIAGNOSIS — J45909 Unspecified asthma, uncomplicated: Secondary | ICD-10-CM | POA: Diagnosis present

## 2020-05-03 DIAGNOSIS — E860 Dehydration: Secondary | ICD-10-CM | POA: Diagnosis present

## 2020-05-03 DIAGNOSIS — Z880 Allergy status to penicillin: Secondary | ICD-10-CM

## 2020-05-03 DIAGNOSIS — R6520 Severe sepsis without septic shock: Secondary | ICD-10-CM | POA: Diagnosis present

## 2020-05-03 DIAGNOSIS — J189 Pneumonia, unspecified organism: Secondary | ICD-10-CM | POA: Insufficient documentation

## 2020-05-03 DIAGNOSIS — Z811 Family history of alcohol abuse and dependence: Secondary | ICD-10-CM

## 2020-05-03 DIAGNOSIS — D6959 Other secondary thrombocytopenia: Secondary | ICD-10-CM | POA: Diagnosis present

## 2020-05-03 DIAGNOSIS — M4722 Other spondylosis with radiculopathy, cervical region: Secondary | ICD-10-CM | POA: Diagnosis not present

## 2020-05-03 DIAGNOSIS — M19041 Primary osteoarthritis, right hand: Secondary | ICD-10-CM | POA: Diagnosis present

## 2020-05-03 DIAGNOSIS — G8250 Quadriplegia, unspecified: Secondary | ICD-10-CM | POA: Diagnosis present

## 2020-05-03 DIAGNOSIS — Z981 Arthrodesis status: Secondary | ICD-10-CM

## 2020-05-03 DIAGNOSIS — R7989 Other specified abnormal findings of blood chemistry: Secondary | ICD-10-CM

## 2020-05-03 DIAGNOSIS — Z20822 Contact with and (suspected) exposure to covid-19: Secondary | ICD-10-CM | POA: Diagnosis present

## 2020-05-03 DIAGNOSIS — G935 Compression of brain: Secondary | ICD-10-CM | POA: Diagnosis present

## 2020-05-03 DIAGNOSIS — M4712 Other spondylosis with myelopathy, cervical region: Secondary | ICD-10-CM | POA: Diagnosis not present

## 2020-05-03 DIAGNOSIS — L89152 Pressure ulcer of sacral region, stage 2: Secondary | ICD-10-CM | POA: Diagnosis present

## 2020-05-03 DIAGNOSIS — R0603 Acute respiratory distress: Secondary | ICD-10-CM | POA: Diagnosis not present

## 2020-05-03 DIAGNOSIS — Z978 Presence of other specified devices: Secondary | ICD-10-CM | POA: Diagnosis not present

## 2020-05-03 DIAGNOSIS — A4159 Other Gram-negative sepsis: Secondary | ICD-10-CM | POA: Diagnosis present

## 2020-05-03 DIAGNOSIS — G959 Disease of spinal cord, unspecified: Secondary | ICD-10-CM | POA: Diagnosis present

## 2020-05-03 DIAGNOSIS — T17500A Unspecified foreign body in bronchus causing asphyxiation, initial encounter: Secondary | ICD-10-CM

## 2020-05-03 DIAGNOSIS — Z833 Family history of diabetes mellitus: Secondary | ICD-10-CM

## 2020-05-03 DIAGNOSIS — Z79899 Other long term (current) drug therapy: Secondary | ICD-10-CM

## 2020-05-03 DIAGNOSIS — A419 Sepsis, unspecified organism: Secondary | ICD-10-CM | POA: Diagnosis present

## 2020-05-03 DIAGNOSIS — R0602 Shortness of breath: Secondary | ICD-10-CM | POA: Diagnosis present

## 2020-05-03 DIAGNOSIS — Z87891 Personal history of nicotine dependence: Secondary | ICD-10-CM

## 2020-05-03 DIAGNOSIS — Z888 Allergy status to other drugs, medicaments and biological substances status: Secondary | ICD-10-CM

## 2020-05-03 DIAGNOSIS — M1990 Unspecified osteoarthritis, unspecified site: Secondary | ICD-10-CM | POA: Diagnosis present

## 2020-05-03 DIAGNOSIS — Z8249 Family history of ischemic heart disease and other diseases of the circulatory system: Secondary | ICD-10-CM

## 2020-05-03 DIAGNOSIS — J9601 Acute respiratory failure with hypoxia: Secondary | ICD-10-CM | POA: Diagnosis present

## 2020-05-03 DIAGNOSIS — N390 Urinary tract infection, site not specified: Secondary | ICD-10-CM

## 2020-05-03 DIAGNOSIS — I10 Essential (primary) hypertension: Secondary | ICD-10-CM | POA: Diagnosis present

## 2020-05-03 DIAGNOSIS — R339 Retention of urine, unspecified: Secondary | ICD-10-CM

## 2020-05-03 DIAGNOSIS — L8990 Pressure ulcer of unspecified site, unspecified stage: Secondary | ICD-10-CM | POA: Insufficient documentation

## 2020-05-03 DIAGNOSIS — Z809 Family history of malignant neoplasm, unspecified: Secondary | ICD-10-CM

## 2020-05-03 DIAGNOSIS — S14129S Central cord syndrome at unspecified level of cervical spinal cord, sequela: Secondary | ICD-10-CM

## 2020-05-03 DIAGNOSIS — T83518A Infection and inflammatory reaction due to other urinary catheter, initial encounter: Principal | ICD-10-CM | POA: Diagnosis present

## 2020-05-03 DIAGNOSIS — R069 Unspecified abnormalities of breathing: Secondary | ICD-10-CM

## 2020-05-03 LAB — URINALYSIS, ROUTINE W REFLEX MICROSCOPIC
Bilirubin Urine: NEGATIVE
Glucose, UA: NEGATIVE mg/dL
Ketones, ur: NEGATIVE mg/dL
Nitrite: NEGATIVE
Protein, ur: 30 mg/dL — AB
Specific Gravity, Urine: 1.018 (ref 1.005–1.030)
WBC, UA: >50 WBC/hpf — ABNORMAL HIGH (ref 0–5)
pH: 6 (ref 5.0–8.0)

## 2020-05-03 LAB — CBC WITH DIFFERENTIAL/PLATELET
Abs Immature Granulocytes: 0.28 K/uL — ABNORMAL HIGH (ref 0.00–0.07)
Basophils Absolute: 0 K/uL (ref 0.0–0.1)
Basophils Relative: 0 %
Eosinophils Absolute: 0 K/uL (ref 0.0–0.5)
Eosinophils Relative: 0 %
HCT: 47.4 % (ref 39.0–52.0)
Hemoglobin: 14.8 g/dL (ref 13.0–17.0)
Immature Granulocytes: 1 %
Lymphocytes Relative: 5 %
Lymphs Abs: 0.9 K/uL (ref 0.7–4.0)
MCH: 28.8 pg (ref 26.0–34.0)
MCHC: 31.2 g/dL (ref 30.0–36.0)
MCV: 92.2 fL (ref 80.0–100.0)
Monocytes Absolute: 1.4 K/uL — ABNORMAL HIGH (ref 0.1–1.0)
Monocytes Relative: 7 %
Neutro Abs: 16.8 K/uL — ABNORMAL HIGH (ref 1.7–7.7)
Neutrophils Relative %: 87 %
Platelets: 133 K/uL — ABNORMAL LOW (ref 150–400)
RBC: 5.14 MIL/uL (ref 4.22–5.81)
RDW: 13.9 % (ref 11.5–15.5)
WBC: 19.4 K/uL — ABNORMAL HIGH (ref 4.0–10.5)
nRBC: 0 % (ref 0.0–0.2)

## 2020-05-03 LAB — I-STAT VENOUS BLOOD GAS, ED
Acid-Base Excess: 6 mmol/L — ABNORMAL HIGH (ref 0.0–2.0)
Bicarbonate: 32.1 mmol/L — ABNORMAL HIGH (ref 20.0–28.0)
Calcium, Ion: 1.11 mmol/L — ABNORMAL LOW (ref 1.15–1.40)
HCT: 65 % — ABNORMAL HIGH (ref 39.0–52.0)
Hemoglobin: 22.1 g/dL (ref 13.0–17.0)
O2 Saturation: 71 %
Potassium: 4.3 mmol/L (ref 3.5–5.1)
Sodium: 150 mmol/L — ABNORMAL HIGH (ref 135–145)
TCO2: 33 mmol/L — ABNORMAL HIGH (ref 22–32)
pCO2, Ven: 46.3 mmHg (ref 44.0–60.0)
pH, Ven: 7.449 — ABNORMAL HIGH (ref 7.250–7.430)
pO2, Ven: 36 mmHg (ref 32.0–45.0)

## 2020-05-03 LAB — COMPREHENSIVE METABOLIC PANEL WITH GFR
ALT: 157 U/L — ABNORMAL HIGH (ref 0–44)
AST: 81 U/L — ABNORMAL HIGH (ref 15–41)
Albumin: 2.2 g/dL — ABNORMAL LOW (ref 3.5–5.0)
Alkaline Phosphatase: 90 U/L (ref 38–126)
Anion gap: 9 (ref 5–15)
BUN: 20 mg/dL (ref 8–23)
CO2: 27 mmol/L (ref 22–32)
Calcium: 8.5 mg/dL — ABNORMAL LOW (ref 8.9–10.3)
Chloride: 111 mmol/L (ref 98–111)
Creatinine, Ser: 1.15 mg/dL (ref 0.61–1.24)
GFR, Estimated: >60 mL/min
Glucose, Bld: 158 mg/dL — ABNORMAL HIGH (ref 70–99)
Potassium: 4.2 mmol/L (ref 3.5–5.1)
Sodium: 147 mmol/L — ABNORMAL HIGH (ref 135–145)
Total Bilirubin: 1.7 mg/dL — ABNORMAL HIGH (ref 0.3–1.2)
Total Protein: 6.2 g/dL — ABNORMAL LOW (ref 6.5–8.1)

## 2020-05-03 LAB — BLOOD GAS, ARTERIAL
Acid-Base Excess: 3.7 mmol/L — ABNORMAL HIGH (ref 0.0–2.0)
Bicarbonate: 26.9 mmol/L (ref 20.0–28.0)
Drawn by: 59101
FIO2: 40
O2 Saturation: 94.3 %
Patient temperature: 36.5
pCO2 arterial: 34.2 mmHg (ref 32.0–48.0)
pH, Arterial: 7.504 — ABNORMAL HIGH (ref 7.350–7.450)
pO2, Arterial: 66.5 mmHg — ABNORMAL LOW (ref 83.0–108.0)

## 2020-05-03 LAB — APTT: aPTT: 32 s (ref 24–36)

## 2020-05-03 LAB — PROTIME-INR
INR: 1.7 — ABNORMAL HIGH (ref 0.8–1.2)
Prothrombin Time: 19.1 s — ABNORMAL HIGH (ref 11.4–15.2)

## 2020-05-03 LAB — LACTIC ACID, PLASMA
Lactic Acid, Venous: 2.7 mmol/L (ref 0.5–1.9)
Lactic Acid, Venous: 2.5 mmol/L (ref 0.5–1.9)

## 2020-05-03 IMAGING — DX DG CHEST 1V PORT
1 series · 1 of 1 positions shown · non-contrast
Comparison: [DATE]

CLINICAL DATA: Respiratory distress

EXAM:
PORTABLE CHEST 1 VIEW

[chest ap]
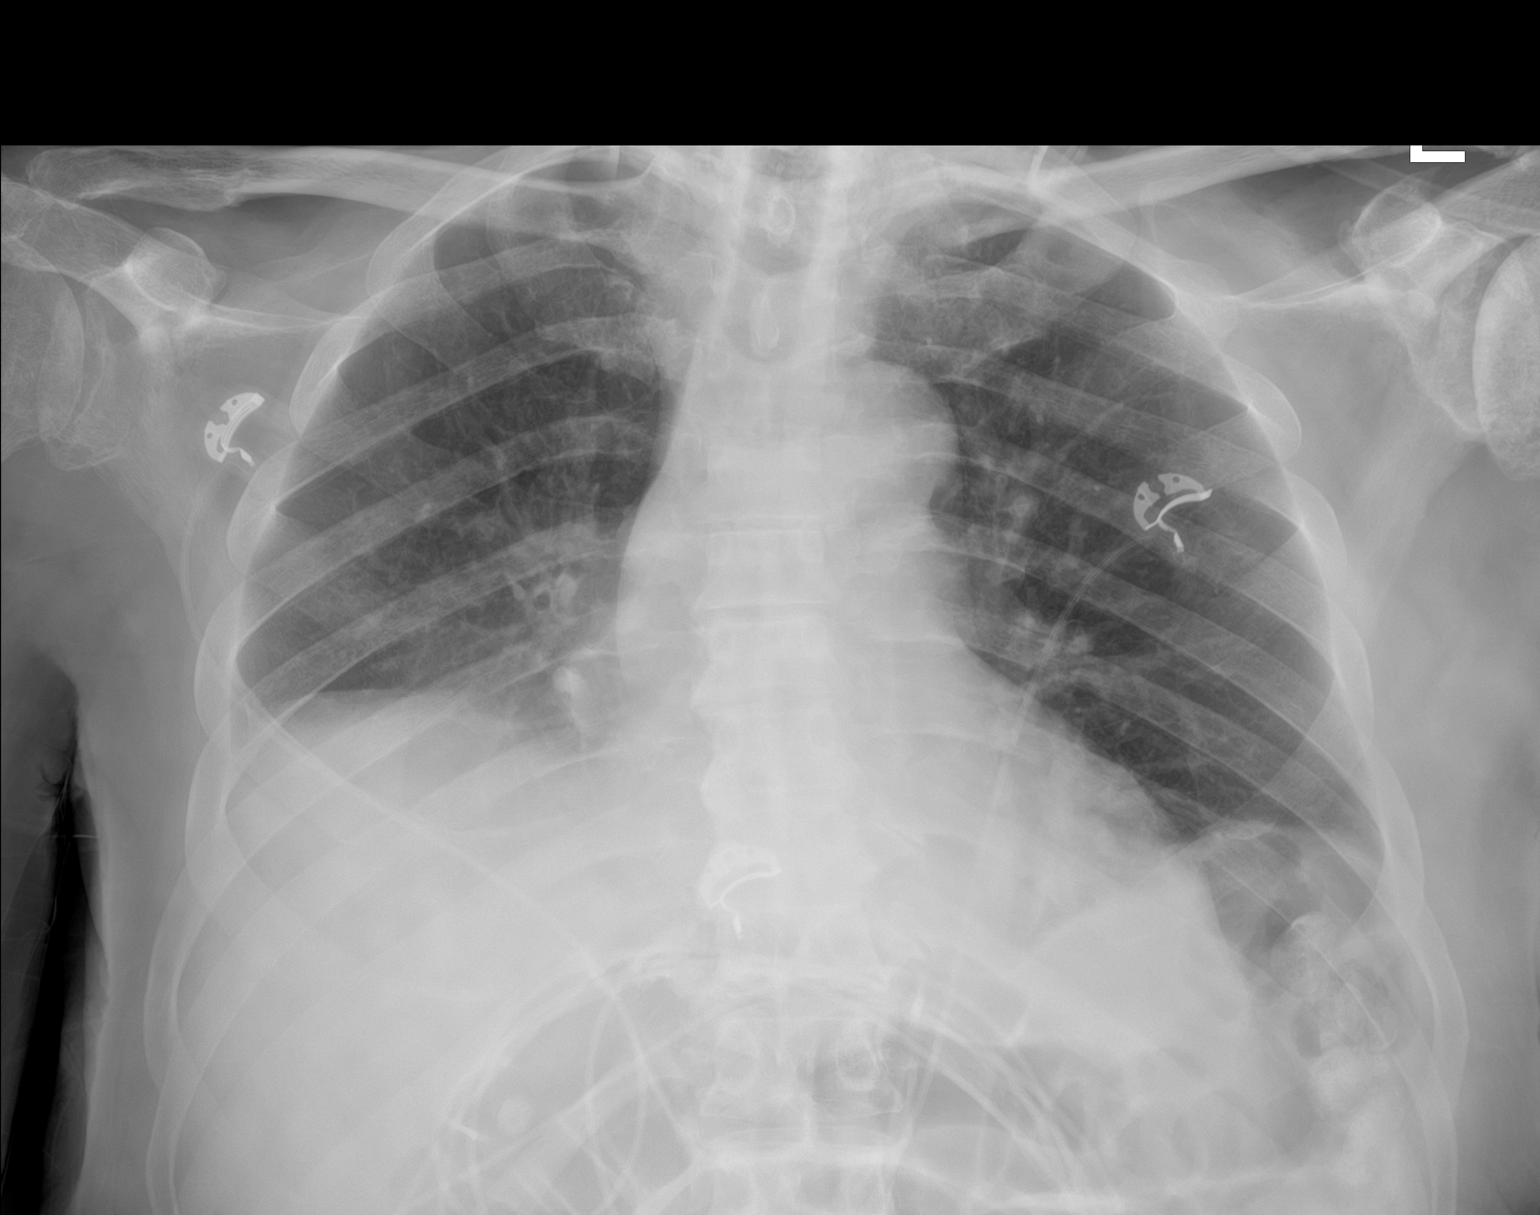

[1 of 1 positions shown; findings below may reference images not displayed]

FINDINGS: Unchanged right basilar consolidation. Cardiomediastinal contours
and pleural spaces are normal. Left lung is clear.
IMPRESSION: Unchanged right basilar consolidation.

## 2020-05-03 IMAGING — CT CT HEAD W/O CM
3 of 4 series · 15 of 47 positions shown, 18 images · non-contrast
Comparison: [DATE].

CLINICAL DATA: Facial trauma after multiple falls.

EXAM:
CT HEAD WITHOUT CONTRAST
CT CERVICAL SPINE WITHOUT CONTRAST
TECHNIQUE: Multidetector CT imaging of the head and cervical spine was
performed following the standard protocol without intravenous
contrast. Multiplanar CT image reconstructions of the cervical spine
were also generated.

[Series 3: head 5.0 h30s · axial · 0.43mm/px · z∈[-95,+50]mm · 9 of 35 slices shown, 12 images]
[im 3/35  brain]
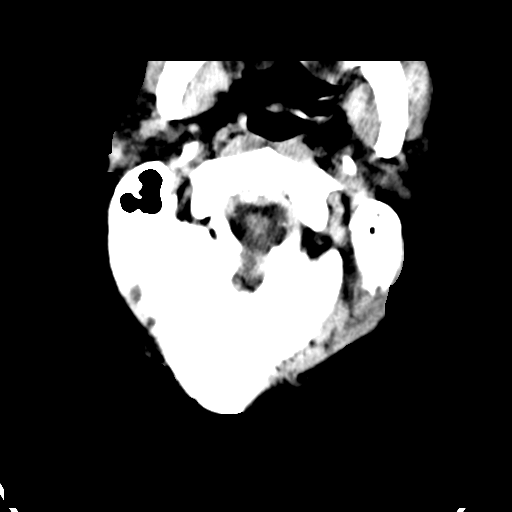
[im 3/35  bone]
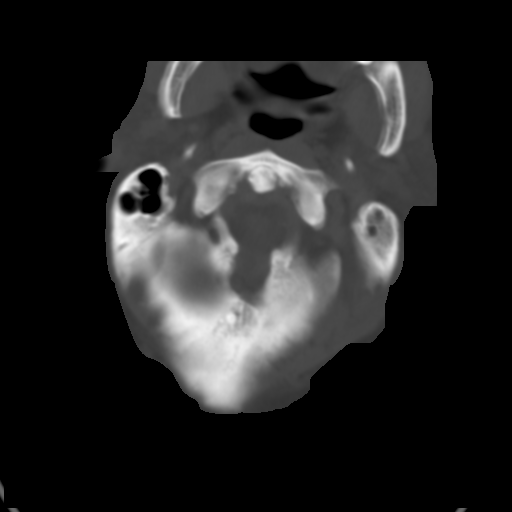
[im 8/35  brain]
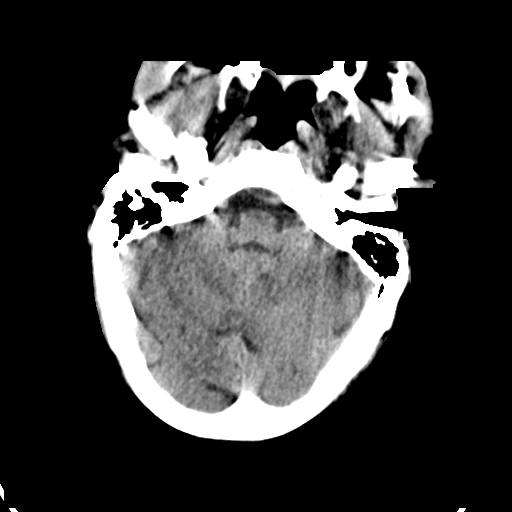
[im 10/35  brain]
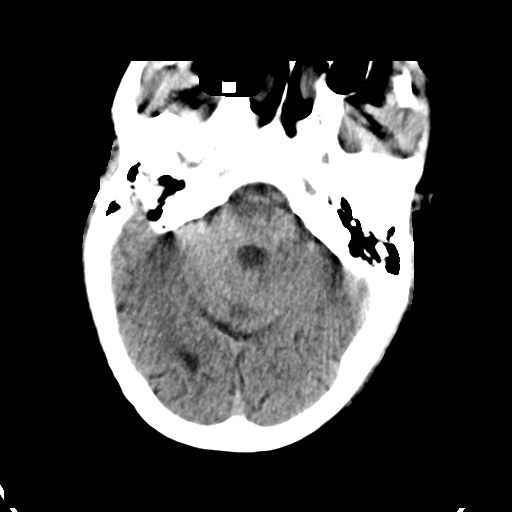
[im 15/35  brain]
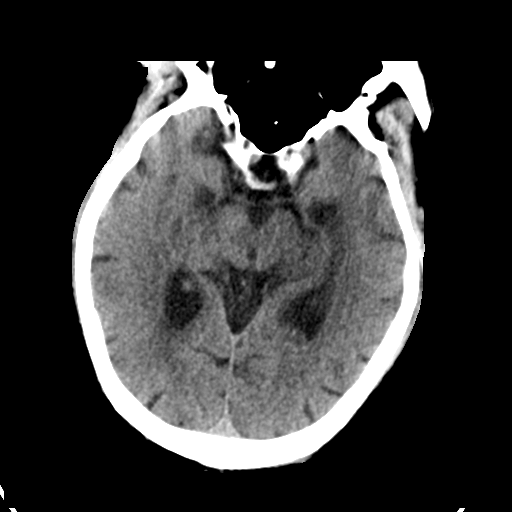
[im 18/35  brain]
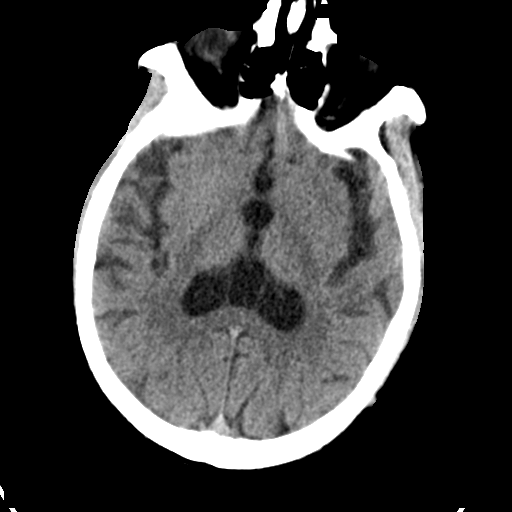
[im 18/35  bone]
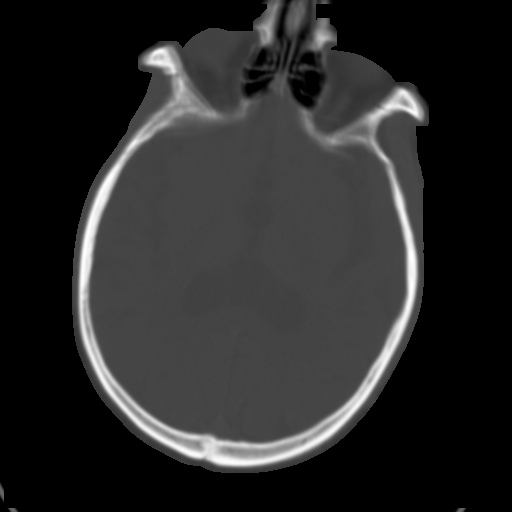
[im 20/35  brain]
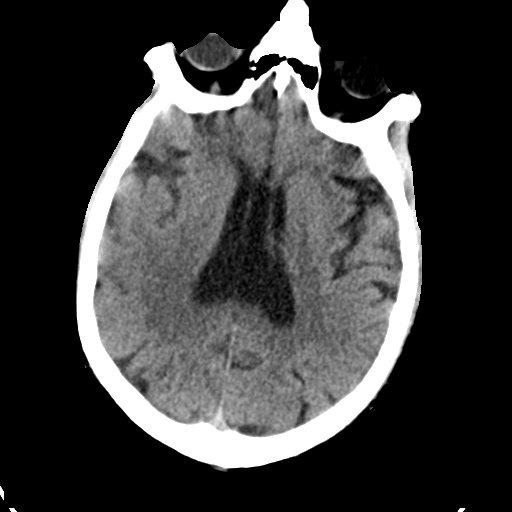
[im 25/35  brain]
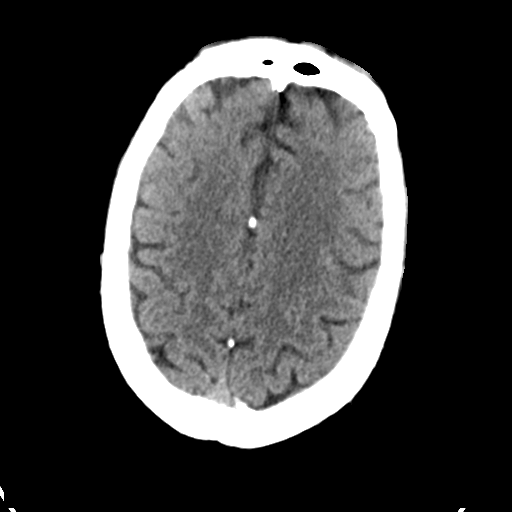
[im 27/35  brain]
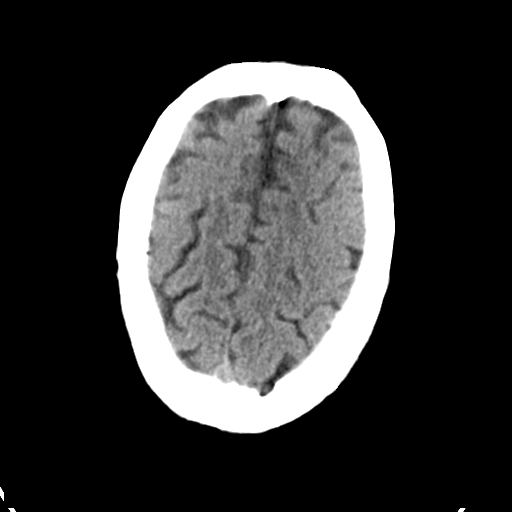
[im 32/35  brain]
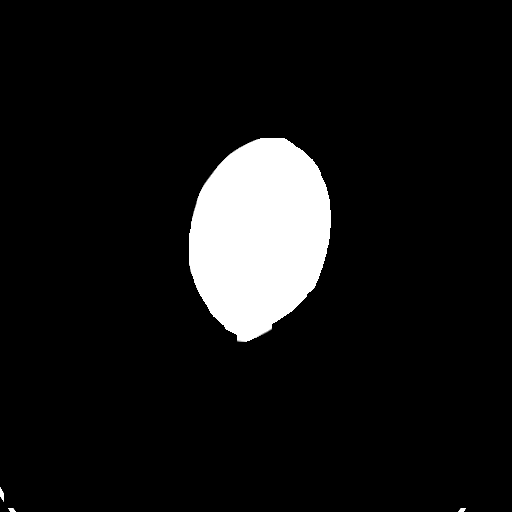
[im 32/35  bone]
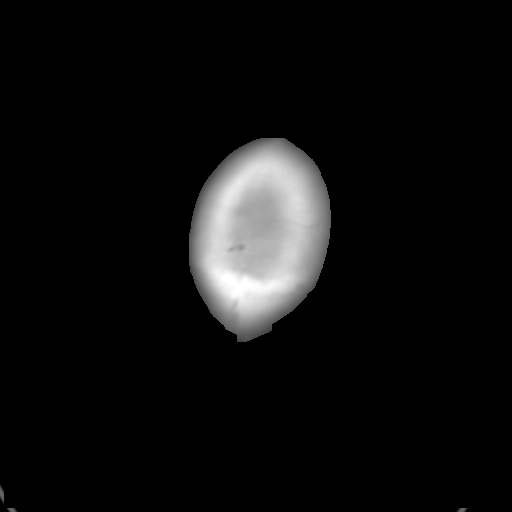

[Series 7: head 3.0 mpr cor · coronal · 0.33mm/px · 3 of 74 slices shown]
[im 29/74  brain]
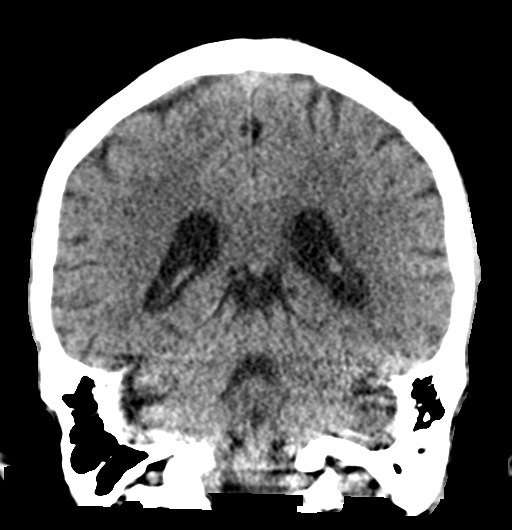
[im 34/74  brain]
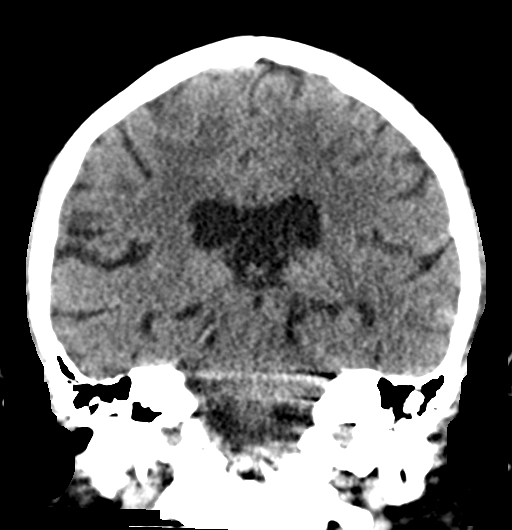
[im 40/74  brain]
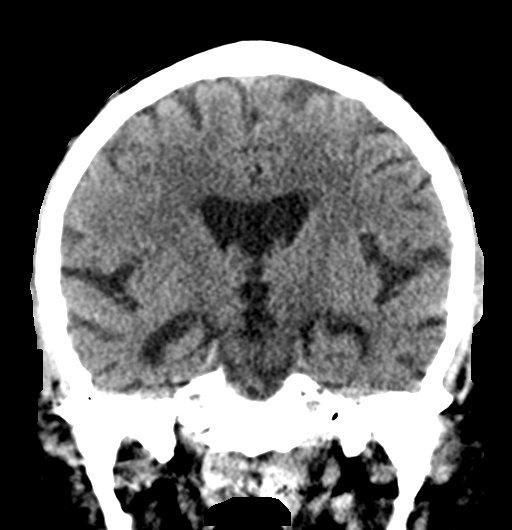

[Series 8: head 3.0 mpr sag · sagittal · 0.34mm/px · 3 of 56 slices shown]
[im 19/56  brain]
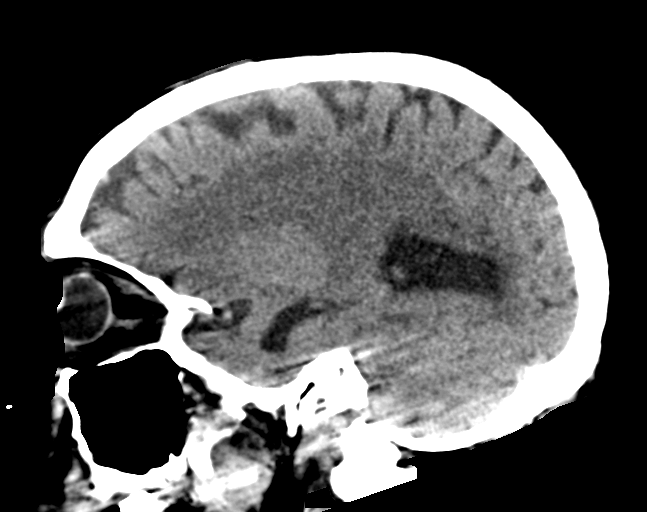
[im 28/56  brain]
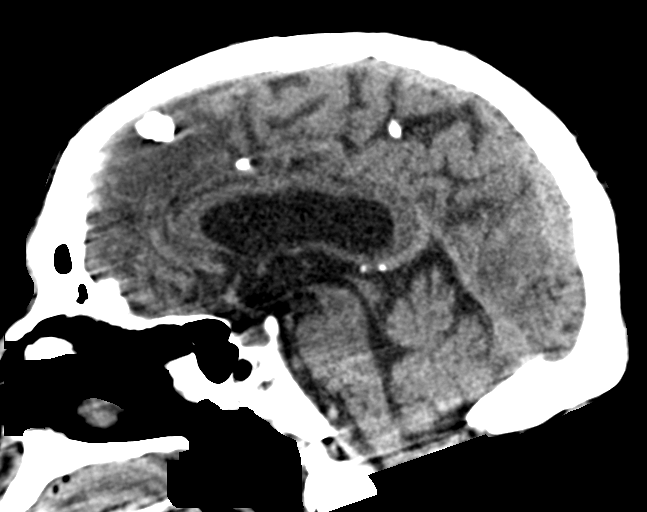
[im 37/56  brain]
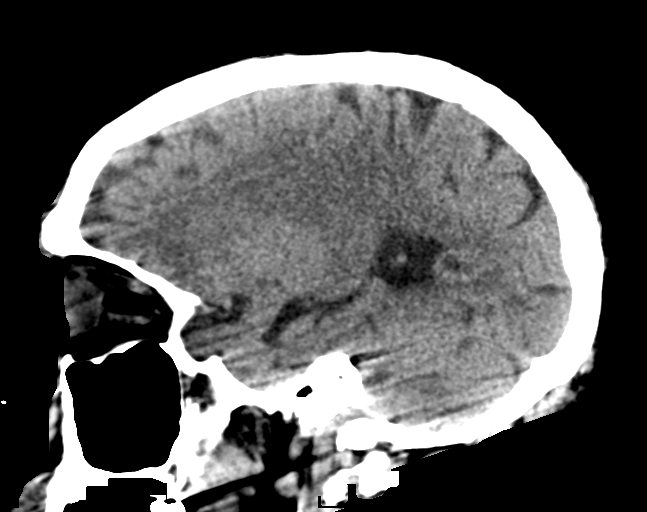

[15 of 47 positions shown; findings below may reference images not displayed]

FINDINGS: CT HEAD FINDINGS

Brain: Mild chronic ischemic white matter disease is noted. No mass
effect or midline shift is noted. Ventricular size is within normal
limits. There is no evidence of mass lesion, hemorrhage or acute
infarction.

Vascular: No hyperdense vessel or unexpected calcification.

Skull: Normal. Negative for fracture or focal lesion.

Sinuses/Orbits: No acute finding.

Other: None.

CT CERVICAL SPINE FINDINGS

Alignment: Normal.

Skull base and vertebrae: No acute fracture is noted. Postsurgical
changes are seen involving the C3, C4 and C5 vertebral bodies.

Soft tissues and spinal canal: No prevertebral fluid or swelling. No
visible canal hematoma.

Disc levels: Status post surgical anterior fusion of C3-4 and C4-5.
There is fusion of the C5-6 and C6-7 disc spaces secondary to
degenerative change.

Upper chest: Negative.

Other: None.
IMPRESSION: 1. Mild chronic ischemic white matter disease. No acute intracranial
abnormality seen.
2. Postsurgical and degenerative changes as described above. No
acute abnormality seen in the cervical spine.

## 2020-05-03 IMAGING — DX DG CHEST 1V PORT
1 series · 1 of 1 positions shown · non-contrast
Comparison: [DATE].

CLINICAL DATA: Questionable sepsis.

EXAM:
PORTABLE CHEST 1 VIEW

[chest ap]
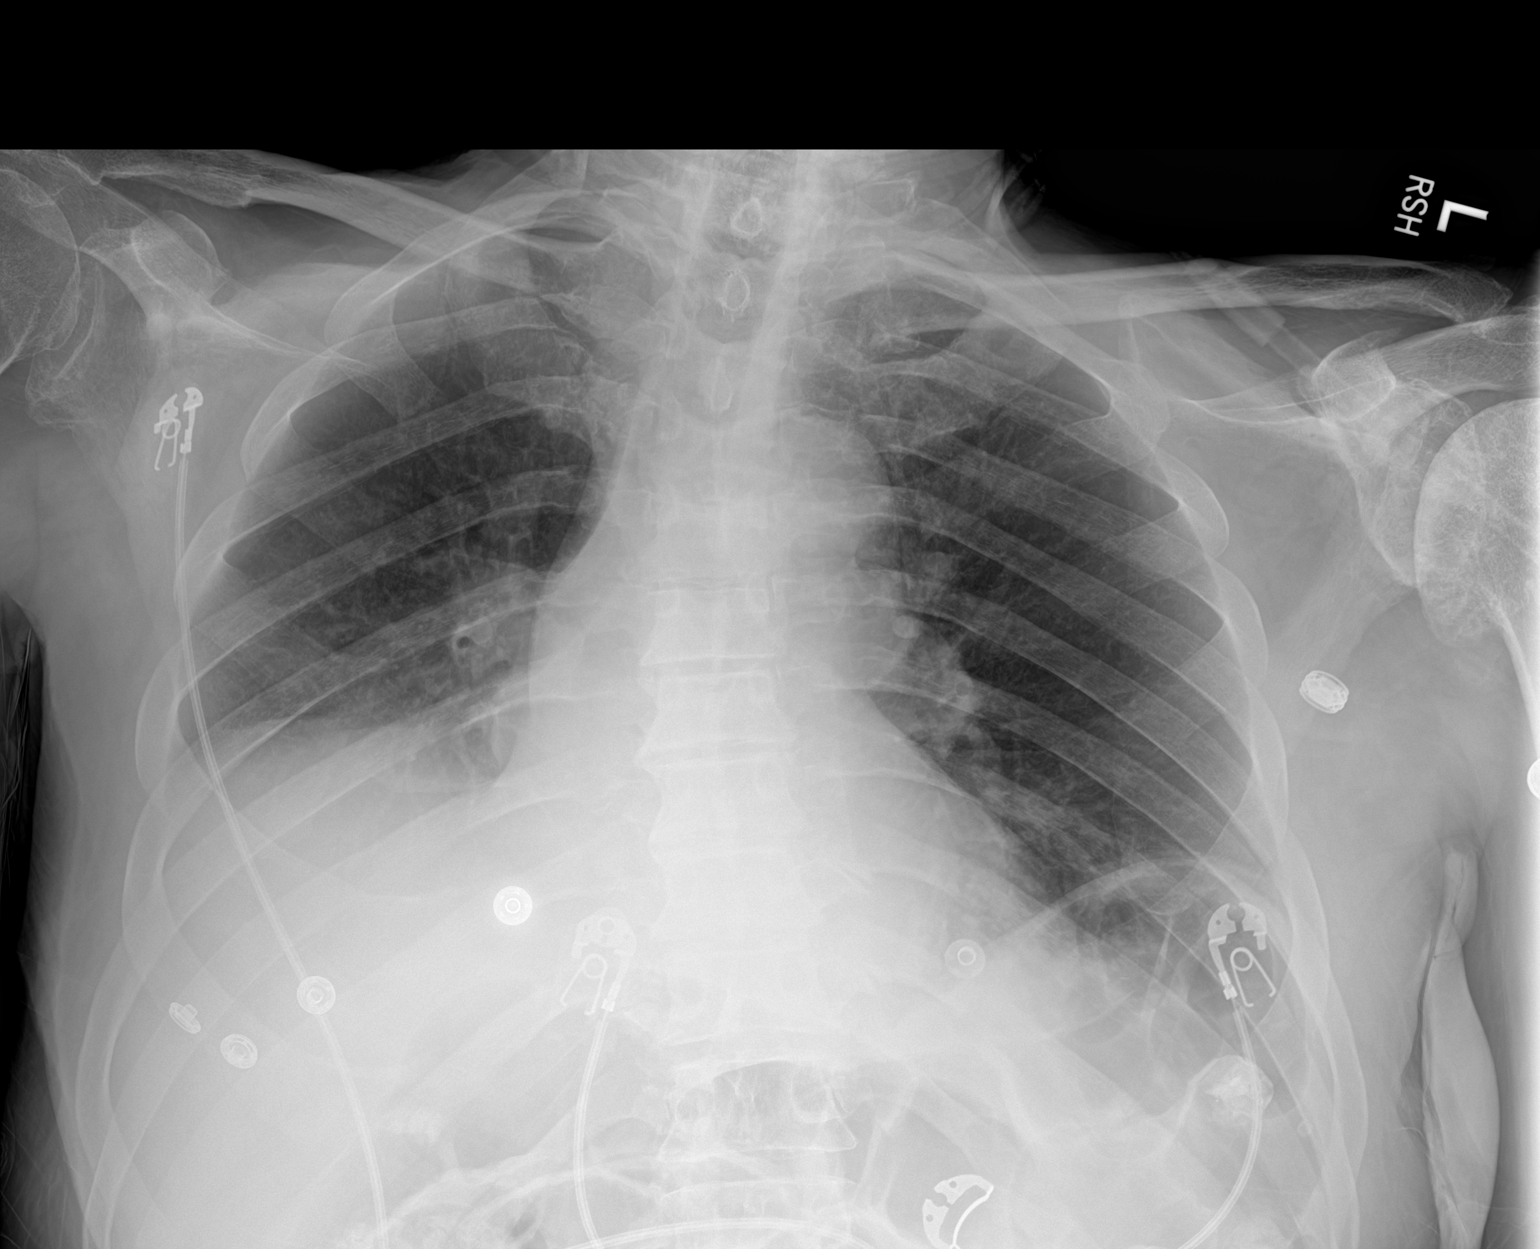

[1 of 1 positions shown; findings below may reference images not displayed]

FINDINGS: The heart size and mediastinal contours are within normal limits.
Low lung volumes. Left basilar opacities. No visible pleural
effusions or pneumothorax. Elevated right hemidiaphragm. No acute
osseous abnormality. Bilateral shoulder degenerative change.
Partially imaged cervical ACDF.
IMPRESSION: Low lung volumes with left basilar opacities, which may represent
atelectasis, aspiration and/or pneumonia. Dedicated PA and lateral
radiographs could better characterize if clinically indicated.

## 2020-05-03 IMAGING — CT CT CHEST W/O CM
2 of 4 series · 13 of 36 positions shown, 16 images · non-contrast
Comparison: [DATE]

CLINICAL DATA: Short of breath, fever, sepsis, multiple falls

EXAM:
CT CHEST WITHOUT CONTRAST
TECHNIQUE: Multidetector CT imaging of the chest was performed following the
standard protocol without IV contrast.

[Series 3: chest wo · axial · 0.91mm/px · z∈[+285,+511]mm · 10 of 135 slices shown, 13 images]
[im 11/135  mediastinal]
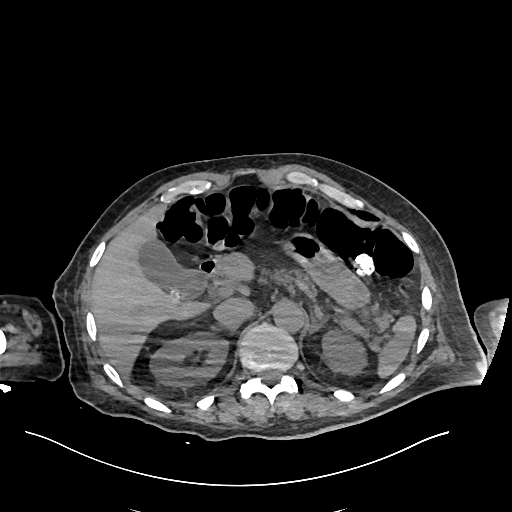
[im 11/135  lung]
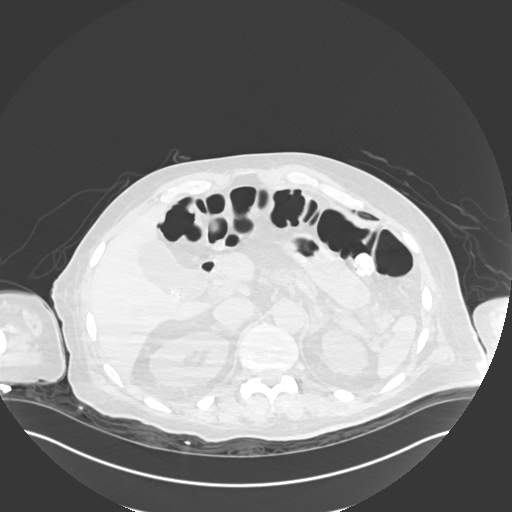
[im 21/135  lung]
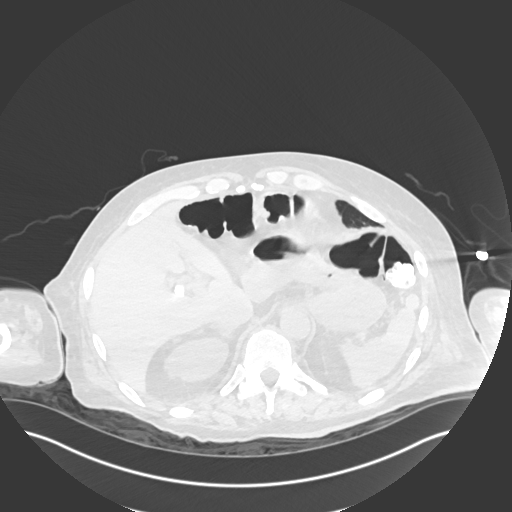
[im 42/135  lung]
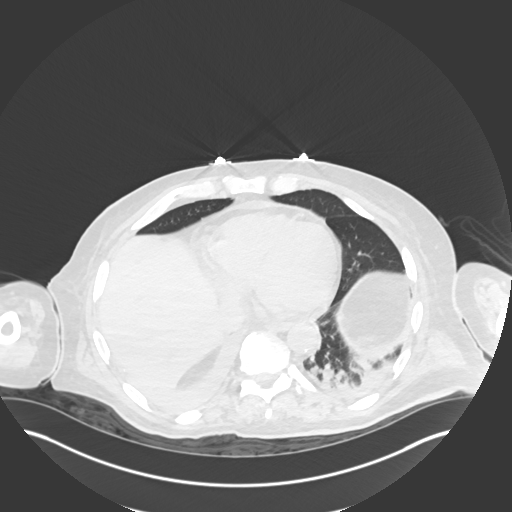
[im 52/135  lung]
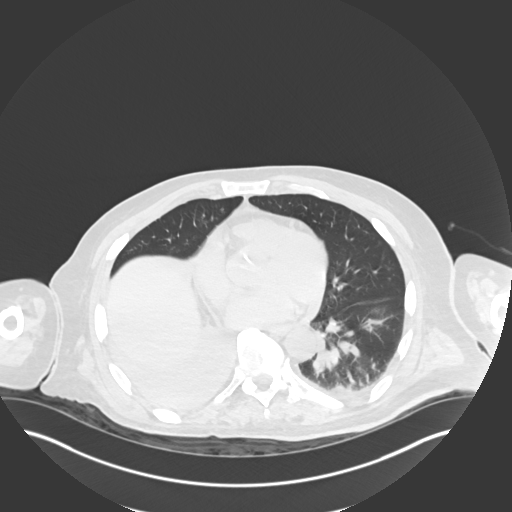
[im 62/135  mediastinal]
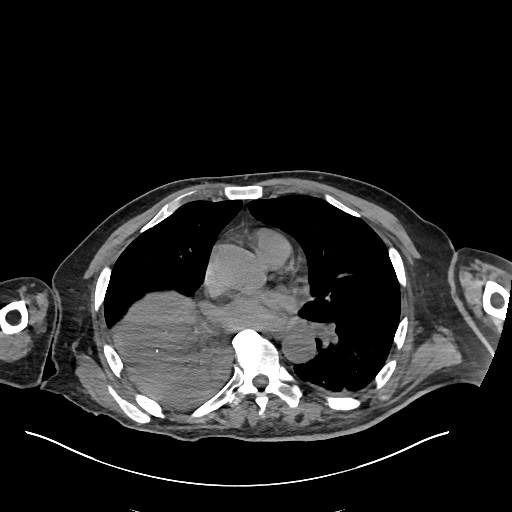
[im 62/135  lung]
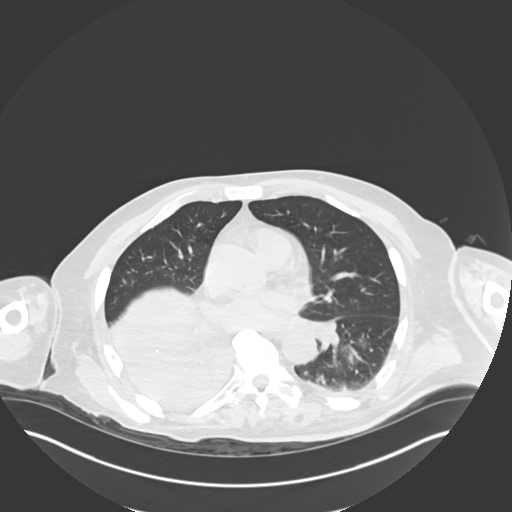
[im 73/135  lung]
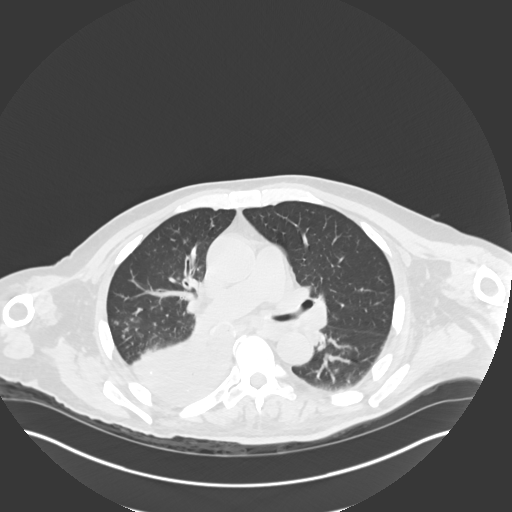
[im 83/135  lung]
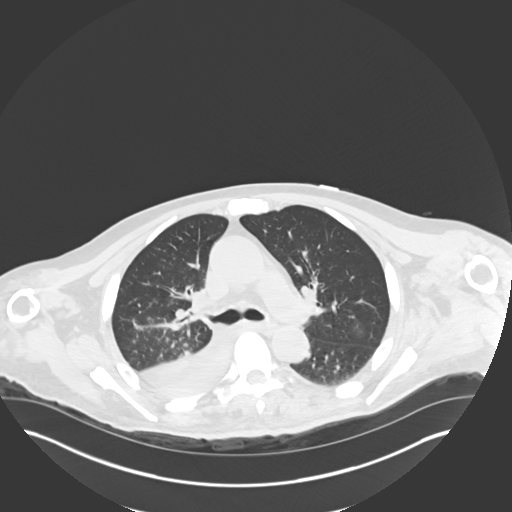
[im 104/135  lung]
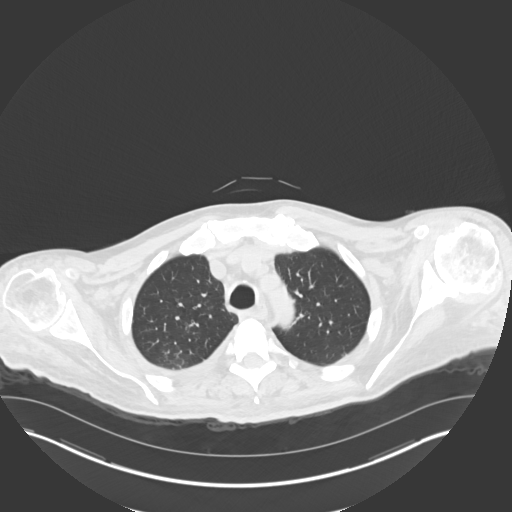
[im 114/135  mediastinal]
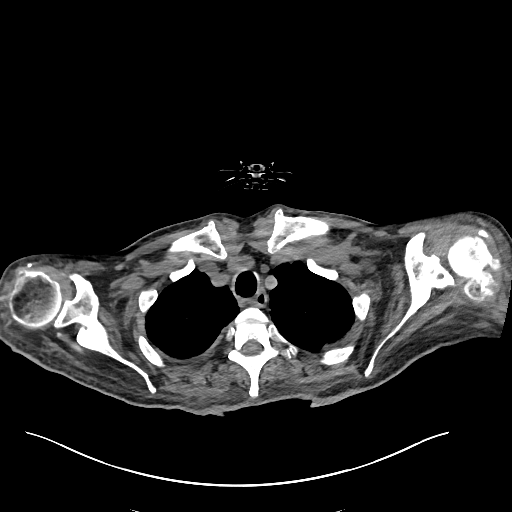
[im 114/135  lung]
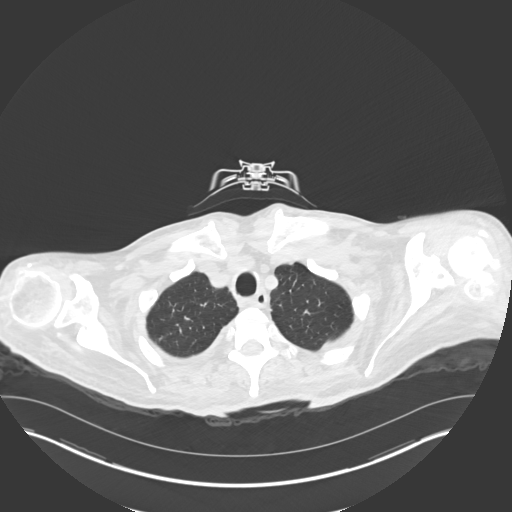
[im 124/135  lung]
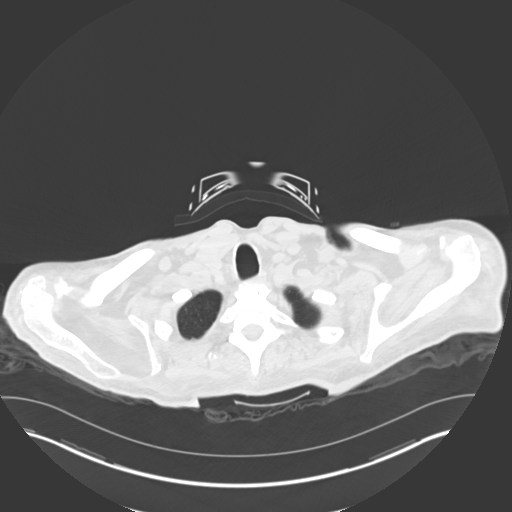

[Series 6: cor · coronal · 0.55mm/px · 3 of 133 slices shown]
[im 27/133  lung]
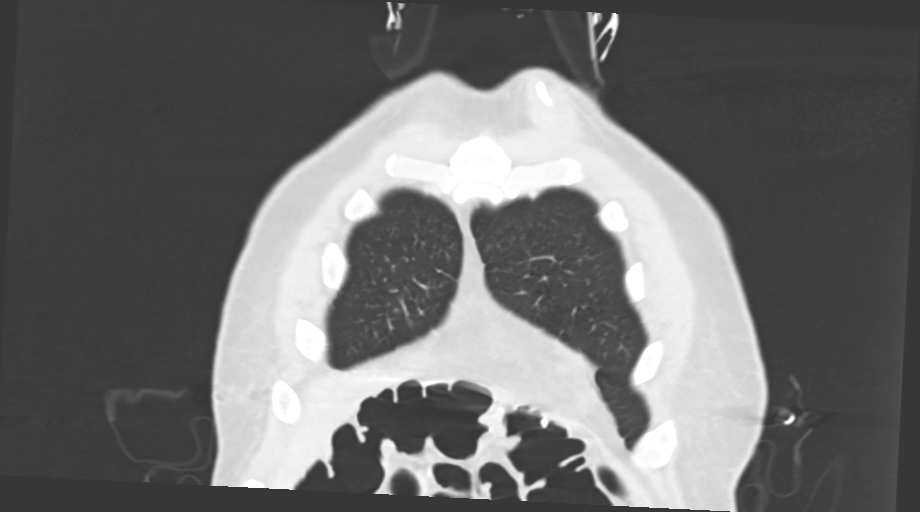
[im 53/133  lung]
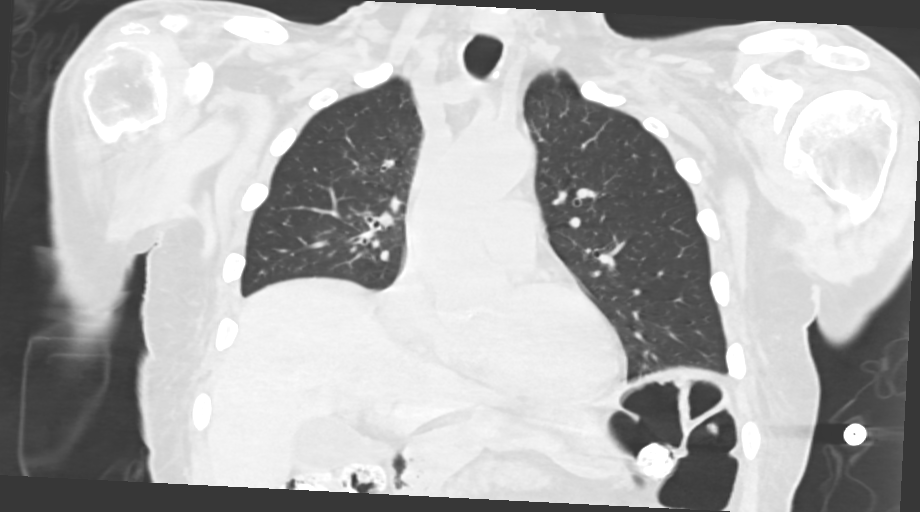
[im 80/133  lung]
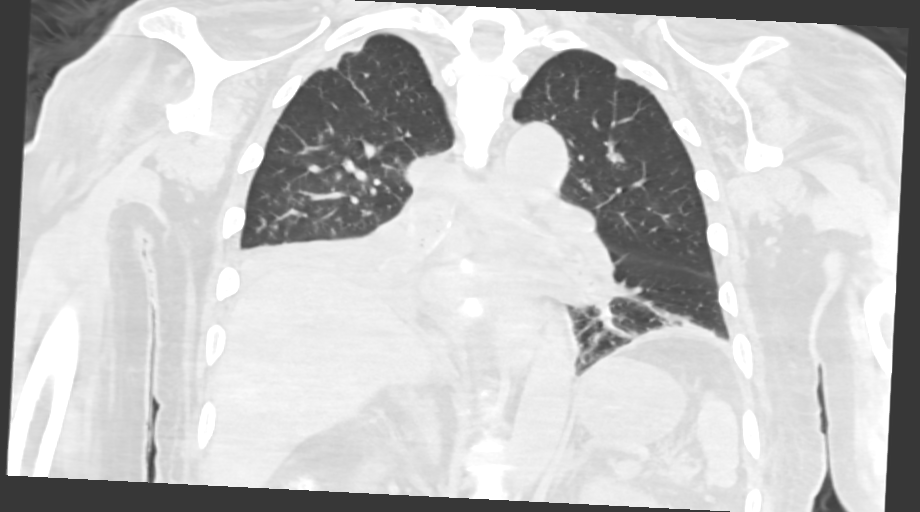

[13 of 36 positions shown; findings below may reference images not displayed]

FINDINGS: Cardiovascular: Unenhanced imaging of the heart and great vessels
demonstrates trace pericardial fluid. The heart is not enlarged.
Aneurysmal dilatation of the ascending thoracic aorta measuring up
to 4.4 cm. Evaluation of the vascular lumen is limited without
intravenous contrast.

Mediastinum/Nodes: No enlarged mediastinal or axillary lymph nodes.
Thyroid gland, trachea, and esophagus demonstrate no significant
findings.

Lungs/Pleura: There is diffuse bronchial wall thickening. Complete
opacification of the bronchus intermedius and right middle and right
lower lobe bronchi are seen, along with partial opacification of the
left lower lobe bronchus. There is patchy airspace disease within
the right upper and left lower lobes. Complete consolidation of the
right middle and right lower lobes with associated volume loss
consistent with atelectasis.

No effusion or pneumothorax.

Upper Abdomen: Calcified gallstone is identified. The remainder of
the upper abdomen is unremarkable.

Musculoskeletal: Severe left shoulder osteoarthritis. No acute or
destructive bony lesions. Reconstructed images demonstrate no
additional findings.
IMPRESSION: 1. Diffuse bronchial wall thickening, with patchy airspace disease
of the right upper and left lower lobes, consistent with
bronchopneumonia.
2. Opacification of the right middle, right lower, and left lower
lobe bronchi, with complete collapse of the right middle and right
lower lobes. This may be related to mucous plugging or underlying
infection.
3. Trace pericardial fluid.
4. Cholelithiasis without cholecystitis.
5. Aneurysmal dilatation of the ascending thoracic aorta measuring
4.4 cm. Recommend annual imaging followup by CTA or MRA. This
recommendation follows [1C]
ACCF/AHA/AATS/ACR/ASA/SCA/EDIT/EDIT/EDIT/EDIT Guidelines for the
Diagnosis and Management of Patients with Thoracic Aortic Disease.
Circulation. [1C]; 121: E266-e369. Aortic aneurysm NOS ([1C]-[1C])

## 2020-05-03 IMAGING — CT CT CERVICAL SPINE W/O CM
3 of 4 series · 13 of 33 positions shown, 16 images · non-contrast
Comparison: [DATE].

CLINICAL DATA: Facial trauma after multiple falls.

EXAM:
CT HEAD WITHOUT CONTRAST
CT CERVICAL SPINE WITHOUT CONTRAST
TECHNIQUE: Multidetector CT imaging of the head and cervical spine was
performed following the standard protocol without intravenous
contrast. Multiplanar CT image reconstructions of the cervical spine
were also generated.

[Series 5: c_spine 2.0 st · axial · 0.33mm/px · z∈[-226,-108]mm · 5 of 89 slices shown, 7 images]
[im 15/89  soft-tissue]
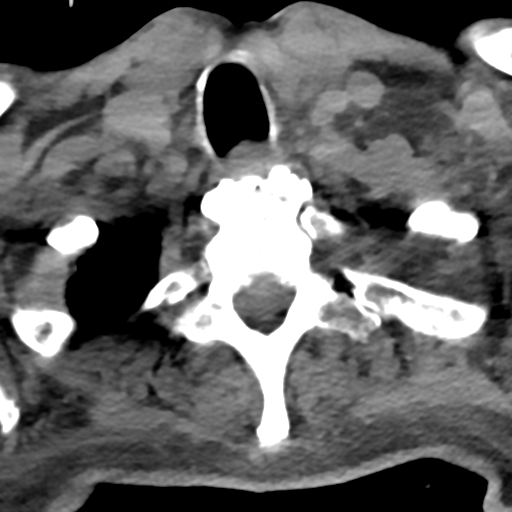
[im 15/89  bone]
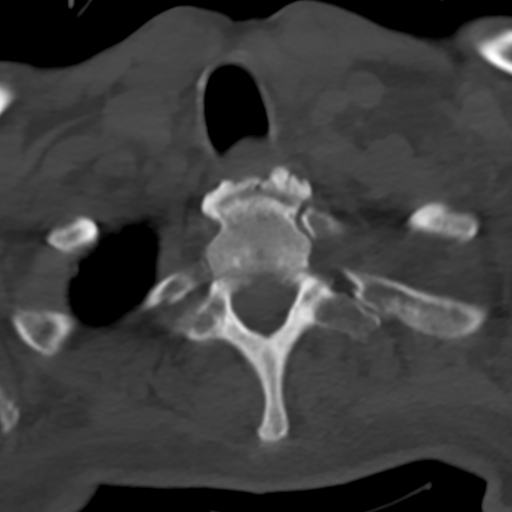
[im 30/89  bone]
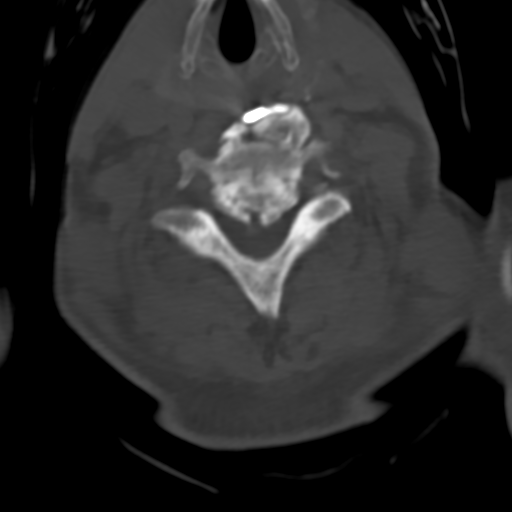
[im 45/89  bone]
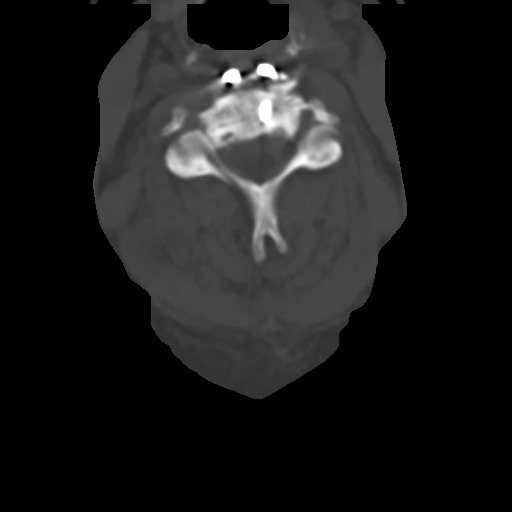
[im 59/89  bone]
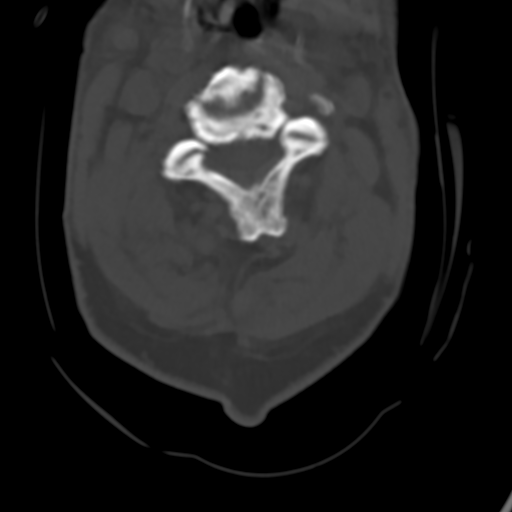
[im 74/89  soft-tissue]
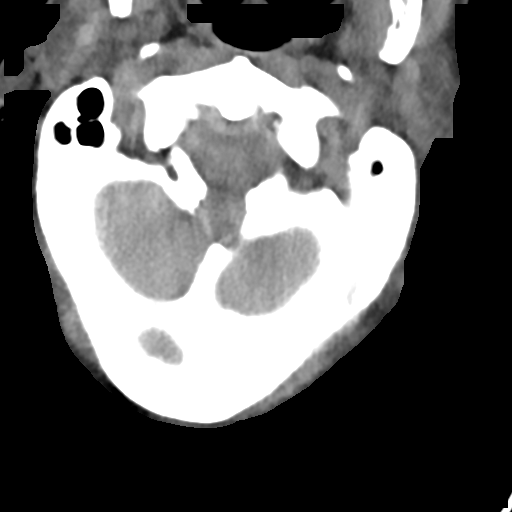
[im 74/89  bone]
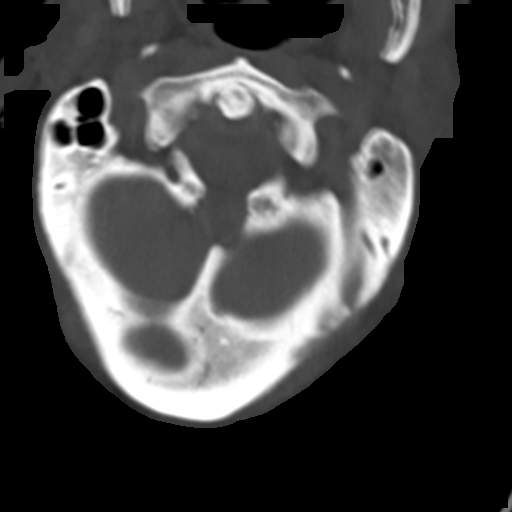

[Series 6: coronal bone · coronal · 0.26mm/px · 3 of 68 slices shown]
[im 14/68  bone]
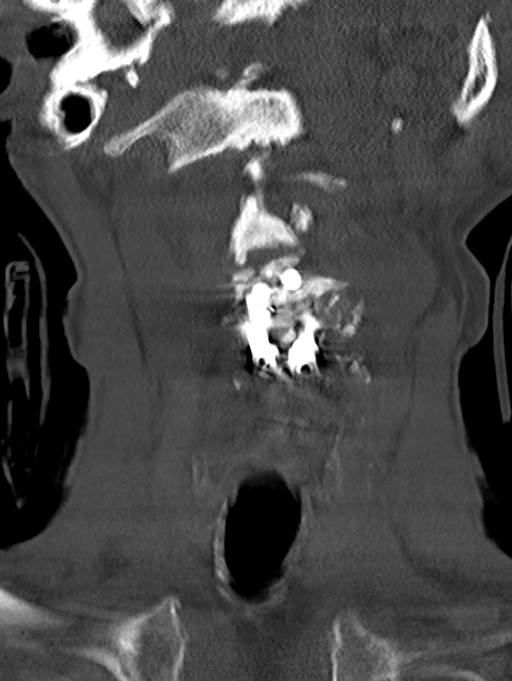
[im 27/68  bone]
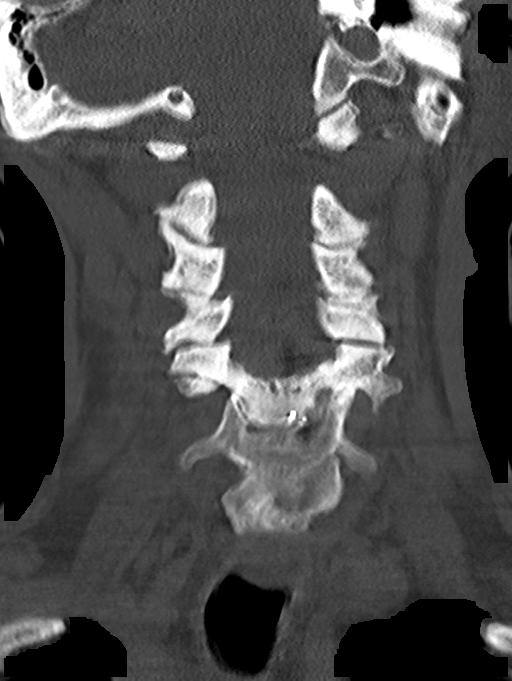
[im 41/68  bone]
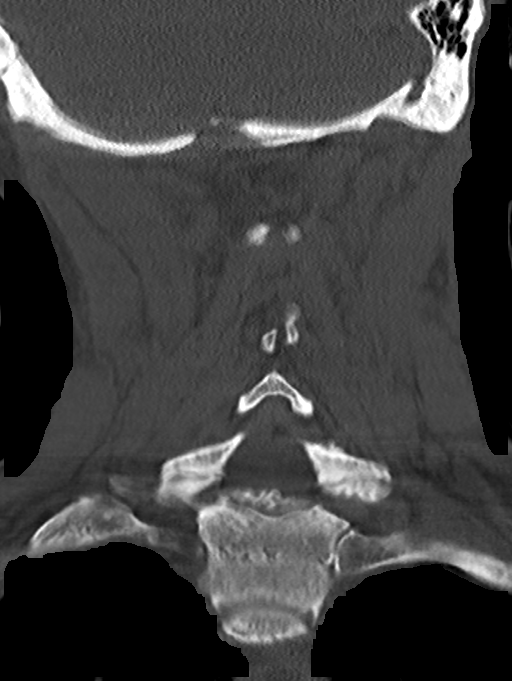

[Series 8: sagittal bone · sagittal · 0.31mm/px · 5 of 61 slices shown, 6 images]
[im 21/61  bone]
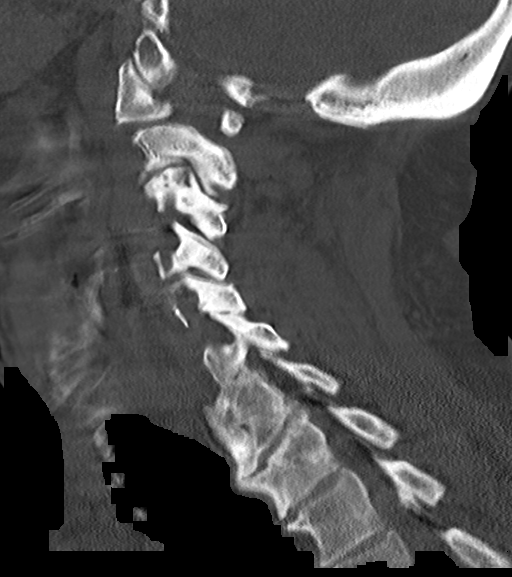
[im 26/61  bone]
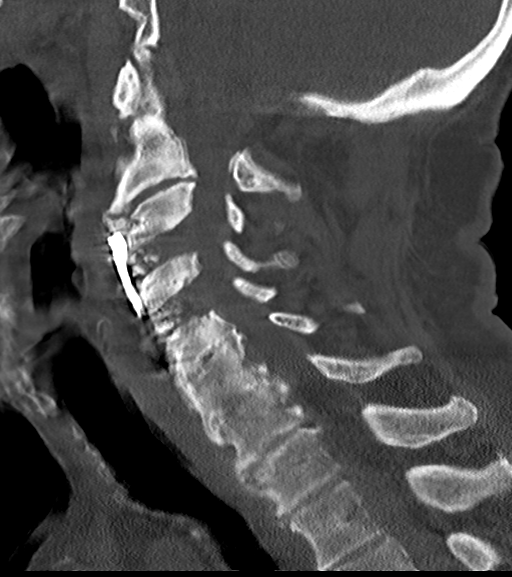
[im 31/61  soft-tissue]
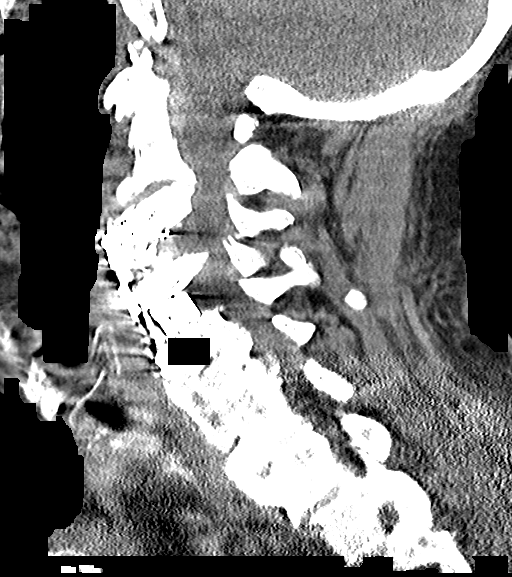
[im 31/61  bone]
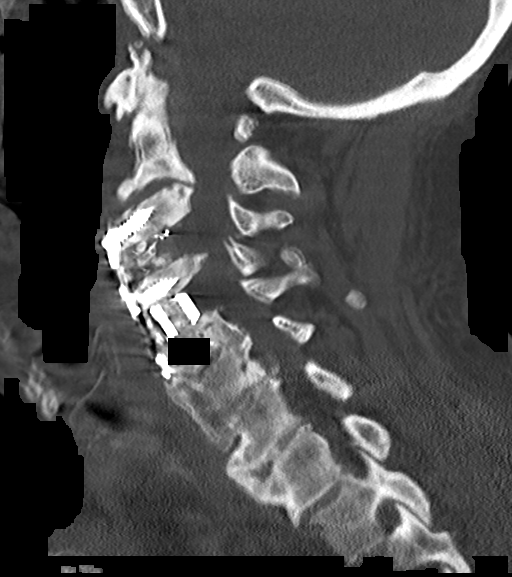
[im 36/61  bone]
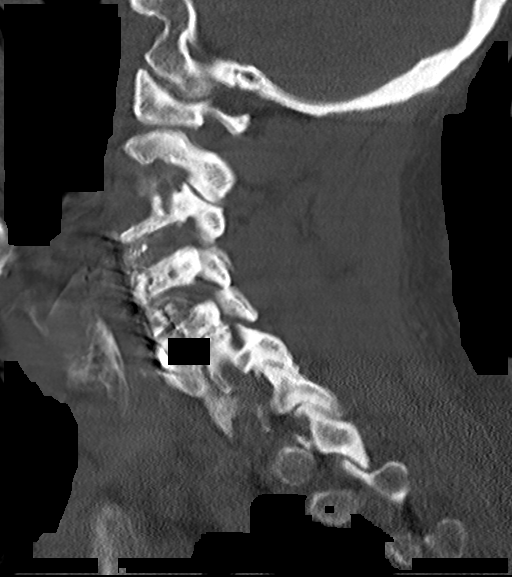
[im 41/61  bone]
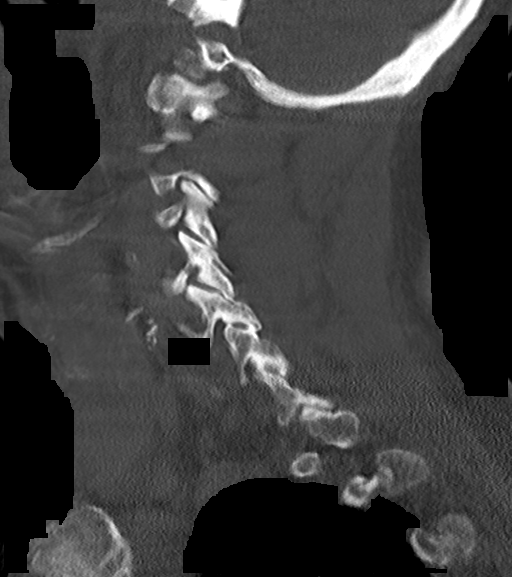

[13 of 33 positions shown; findings below may reference images not displayed]

FINDINGS: CT HEAD FINDINGS

Brain: Mild chronic ischemic white matter disease is noted. No mass
effect or midline shift is noted. Ventricular size is within normal
limits. There is no evidence of mass lesion, hemorrhage or acute
infarction.

Vascular: No hyperdense vessel or unexpected calcification.

Skull: Normal. Negative for fracture or focal lesion.

Sinuses/Orbits: No acute finding.

Other: None.

CT CERVICAL SPINE FINDINGS

Alignment: Normal.

Skull base and vertebrae: No acute fracture is noted. Postsurgical
changes are seen involving the C3, C4 and C5 vertebral bodies.

Soft tissues and spinal canal: No prevertebral fluid or swelling. No
visible canal hematoma.

Disc levels: Status post surgical anterior fusion of C3-4 and C4-5.
There is fusion of the C5-6 and C6-7 disc spaces secondary to
degenerative change.

Upper chest: Negative.

Other: None.
IMPRESSION: 1. Mild chronic ischemic white matter disease. No acute intracranial
abnormality seen.
2. Postsurgical and degenerative changes as described above. No
acute abnormality seen in the cervical spine.

## 2020-05-03 MED ORDER — LACTATED RINGERS IV BOLUS (SEPSIS)
1000.0000 mL | Freq: Once | INTRAVENOUS | Status: AC
  Administered 2020-05-03: 1000 mL via INTRAVENOUS

## 2020-05-03 MED ORDER — ONDANSETRON HCL 4 MG/2ML IJ SOLN: 4.0000 mg | Freq: Four times a day (QID) | INTRAMUSCULAR | Status: DC | PRN

## 2020-05-03 MED ORDER — APIXABAN 2.5 MG PO TABS
2.5000 mg | ORAL_TABLET | Freq: Two times a day (BID) | ORAL | Status: DC
  Filled 2020-05-03: qty 1

## 2020-05-03 MED ORDER — ACETAMINOPHEN 325 MG PO TABS
650.0000 mg | ORAL_TABLET | Freq: Four times a day (QID) | ORAL | Status: DC | PRN
  Administered 2020-05-04: 650 mg via ORAL
  Filled 2020-05-03: qty 2

## 2020-05-03 MED ORDER — POLYETHYLENE GLYCOL 3350 17 G PO PACK
17.0000 g | PACK | Freq: Every day | ORAL | Status: DC
  Administered 2020-05-05 – 2020-05-10 (×5): 17 g via ORAL
  Filled 2020-05-03 (×6): qty 1

## 2020-05-03 MED ORDER — SODIUM CHLORIDE 0.9 % IV SOLN
1.0000 g | INTRAVENOUS | Status: DC
  Administered 2020-05-03: 1 g via INTRAVENOUS
  Filled 2020-05-03: qty 10

## 2020-05-03 MED ORDER — ACETAMINOPHEN 650 MG RE SUPP: 650.0000 mg | Freq: Four times a day (QID) | RECTAL | Status: DC | PRN

## 2020-05-03 MED ORDER — ONDANSETRON HCL 4 MG PO TABS: 4.0000 mg | ORAL_TABLET | Freq: Four times a day (QID) | ORAL | Status: DC | PRN

## 2020-05-03 MED ORDER — LEVOFLOXACIN IN D5W 750 MG/150ML IV SOLN
750.0000 mg | Freq: Once | INTRAVENOUS | Status: DC
  Filled 2020-05-03: qty 150

## 2020-05-03 MED ORDER — SODIUM CHLORIDE 0.9 % IV SOLN
500.0000 mg | INTRAVENOUS | Status: DC
  Administered 2020-05-03: 500 mg via INTRAVENOUS
  Filled 2020-05-03: qty 500

## 2020-05-03 MED ORDER — PANTOPRAZOLE SODIUM 40 MG PO TBEC
40.0000 mg | DELAYED_RELEASE_TABLET | Freq: Every day | ORAL | Status: DC
  Administered 2020-05-03: 40 mg via ORAL
  Filled 2020-05-03: qty 1

## 2020-05-03 MED ORDER — DEXTROSE-NACL 5-0.45 % IV SOLN: INTRAVENOUS | Status: DC

## 2020-05-03 MED ORDER — TAMSULOSIN HCL 0.4 MG PO CAPS
0.4000 mg | ORAL_CAPSULE | Freq: Every day | ORAL | Status: DC
  Administered 2020-05-03 – 2020-05-11 (×9): 0.4 mg via ORAL
  Filled 2020-05-03 (×10): qty 1

## 2020-05-03 MED ORDER — SODIUM CHLORIDE 0.9 % IV SOLN
500.0000 mg | INTRAVENOUS | Status: DC
  Administered 2020-05-04 – 2020-05-07 (×3): 500 mg via INTRAVENOUS
  Filled 2020-05-03 (×6): qty 500

## 2020-05-03 MED ORDER — SODIUM CHLORIDE 0.9 % IV SOLN
2.0000 g | INTRAVENOUS | Status: DC
  Filled 2020-05-03: qty 20

## 2020-05-03 MED ORDER — LACTATED RINGERS IV SOLN: INTRAVENOUS | Status: DC

## 2020-05-03 NOTE — Plan of Care (Signed)

## 2020-05-03 NOTE — Sepsis Progress Note (Signed)
Sepsis protocol being followed by [facility] 

## 2020-05-03 NOTE — Progress Notes (Signed)
Patient was admitted this afternoon for sepsis due to UTI, related to chronic indwelling Foley catheter.  Chest x-ray done at the time of admission showing left basilar opacities concerning for atelectasis, aspiration, and/or pneumonia.  He was given IV ceftriaxone and azithromycin in the ED.  Given 3 L IV fluid boluses.  Patient has continued to be tachycardic and tachypneic since the time of admission.  Not hypotensive.  Arrived the unit on 3 L supplemental oxygen and was satting 87%, oxygen saturation improved to 93% with 5 L supplemental oxygen via nasal cannula.    Patient was seen and examined at bedside.  Somnolent but easily arousable.  He had no complaints.  Denied chest pain or shortness of breath.  Tachycardic with heart rate in the 110s.  Tachypneic with respiratory rate in the 30s.  Decreased breath sounds appreciated at the bases.  No wheezing.  No lower extremity edema appreciated.  CT chest done this afternoon showing diffuse bronchial wall thickening with patchy airspace disease of the right upper and left lower lobes consistent with bronchopneumonia.  Opacification of the right middle, right lower, and left lower lobe bronchi with complete collapse of the right middle and right lower lobes.  Findings may be related to mucous plugging or underlying infection.   He had a negative SARS-CoV-2 PCR test on 04/27/2020 but no repeat test done at the time of admission today.   -SARS-CoV-2 PCR test ordered, airborne and contact precautions at this time -Hold maintenance fluid at this time, check BNP -Repeat chest x-ray showing unchanged right basilar consolidation -Continue ceftriaxone and Azithromycin for CAP coverage -ABG showing pH 7.50, PCO2 34, and PO2 66 -Discussed with on-call PCCM MD who recommended holding off mucolytic agent and instead recommended trying CPAP. -Will continue to monitor very closely, low threshold to call PCCM again if no improvement in work of breathing with CPAP.

## 2020-05-03 NOTE — Progress Notes (Signed)
Called rapid response nurse for consult to see patient. Paged Provider with no response yet. Patient tachypneic with respiratory rate up to 35. Noted abdominal accessory muscle use. Lungs diminished with rhochi. Patient has very wet/gurgly cough. Patient denies any production of sputum with cough. Patient does not appear to be in distress, is resting with eyes closed. Chest CT done in emergency department with abnormal results. Unsure if provider aware of findings. Patient arrived to unit on 3L nasal cannula. Spo2 87%. Increased oxygen to 5L, with Spo2 now 93%.

## 2020-05-03 NOTE — Progress Notes (Signed)
   05/03/20 2009  Provider Notification  Provider Name/Title Mauricio  Date Provider Notified 05/03/20  Time Provider Notified 2009  Notification Type  (secure message)  Notification Reason Other (Comment)  Patient having very labored breathing with abdominal accessory muscle use. Respiratory rate 35. Spo2 87% on 3L. Increased oxygen via nasal canula to 5L and now Spo2 93%. lung sounds diminished with rhonchi. very weak, wet/gurgly  cough. chest CT abnormal.

## 2020-05-03 NOTE — ED Provider Notes (Addendum)
[facility] EMERGENCY DEPARTMENT Provider Note   CSN: [id number] Arrival date & time: 05/03/20  1220     History Chief Complaint  Patient presents with  . Shortness of Breath  . Fever    Shannon [name] is a 74 y.o. male.  HPI 74 year old male with a history of asthma, Chiari malformation, hypertension, quadriplegia secondary to fall, presents to the ER as a code sepsis via EMS.  Patient presents via from [facility] and rehab, has had cough since Fridays, overall feeling unwell.  No reported fevers.  Patient able to provide history.  He has had a nonproductive cough, oxygen saturations one-point dropped to 84% on 4LNC. Placed on 10L and was still satting high 80s.  He does not have any oxygen requirements at home.  Currently 93% on room air.  He has a history of tracheostomy, he does not report pain to the site of the incision.  He was admitted to [facility] on Friday as per discussion with staff, has been evaluated by speech therapy as he has poor swallowing function.  He was also noted to have some swelling to his upper extremities and was evaluated for DVTs which was negative.  It was reported by EMS that the patient had a fall approximately 3 days ago, arrived with c-collar in place.  Past Medical History:  Diagnosis Date  . Asthma   . 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 Newell Coral, 1992-->surg at [facility].  Summer 2021->Cervicalgia,arm pain, hand atrophy RUE wkness, hyperreflex (Dr. 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 Hay fever   . Hypertension   . Osteoarthritis of both hands     Patient Active Problem List   Diagnosis Date Noted  . Pneumonia 05/03/2020  . 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 Myelopathy (HCC) 04/22/2020  . Quadriplegia and quadriparesis (HCC)   . Acute blood loss anemia   .  Slow transit constipation   . Essential hypertension   . Cervical myelopathy (HCC) 04/07/2020  . Quadriparesis (HCC)   . Benign essential HTN   . Post-operative pain   . Neuropathic pain   . Asthma   . 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 ANTERIOR CERVICAL DECOMP/DISCECTOMY FUSION N/A 04/05/2020   Procedure: ANTERIOR CERVICAL DECOMPRESSION/DISCECTOMY FUSION, INTERBODY PROSTHESIS, PLATE/SCREWS CERVICAL THREE-CERVICAL FOUR, CERVICAL FOUR- CERVICAL FIVE;  Surgeon: Tressie Stalker, MD;  Location: [facility] OR;  Service: Neurosurgery;  Laterality: N/A;  . ANTERIOR CERVICAL DECOMP/DISCECTOMY FUSION N/A 04/22/2020   Procedure: Reexploration of anterior cervical wound for epidural hematoma;  Surgeon: Donalee Citrin, MD;  Location: [facility] OR;  Service: Neurosurgery;  Laterality: N/A;  . BACK SURGERY    . INCISION AND DRAINAGE Left 2012   L hand infection  . ROTATOR CUFF REPAIR Right    x 3  . SPINE SURGERY         Family History  Problem Relation Age of Onset  . Heart attack Mother   . Heart disease Mother   . High blood pressure Mother   . Alcohol abuse Father   . Cancer Father   . Diabetes Father   . High blood pressure Father     Social History   Tobacco Use  . Smoking status: Former Games developer  . Smokeless tobacco: Never Used  Vaping Use  .  Vaping Use: Never used  Substance Use Topics  . Alcohol use: Not Currently  . Drug use: Never    Home Medications Prior to Admission medications   Medication Sig Start Date End Date Taking? Authorizing Provider  acetaminophen (TYLENOL) 500 MG tablet Take 1,000 mg by mouth in the morning, at noon, and at bedtime.   Yes [provider]  amLODipine (NORVASC) 10 MG tablet Take 1 tablet (10 mg total) by mouth daily. 02/16/20  Yes McGowen, Maryjean Morn, MD  apixaban (ELIQUIS) 2.5 MG TABS tablet Take 1 tablet (2.5 mg total) by mouth 2 (two) times daily. 04/19/20  Yes  Love, [name], PA-C  docusate sodium (COLACE) 100 MG capsule Take 1 capsule (100 mg total) by mouth 2 (two) times daily. 04/19/20  Yes Love, [name], PA-C  oxyCODONE (OXY IR/ROXICODONE) 5 MG immediate release tablet Take 5 mg by mouth daily as needed for severe pain.   Yes [provider]  pantoprazole (PROTONIX) 40 MG tablet Take 1 tablet (40 mg total) by mouth at bedtime. 04/19/20  Yes Love, [name], PA-C  polyethylene glycol (MIRALAX / GLYCOLAX) 17 g packet Take 17 g by mouth daily.   Yes [provider]  tamsulosin (FLOMAX) 0.4 MG CAPS capsule Take 1 capsule (0.4 mg total) by mouth daily. 04/28/20  Yes Tressie Stalker, MD    Allergies    Iodine and Penicillins  Review of Systems   Review of Systems  Constitutional: Positive for chills, fatigue and fever.  HENT: Positive for trouble swallowing. Negative for ear pain and sore throat.   Eyes: Negative for pain and visual disturbance.  Respiratory: Positive for cough and shortness of breath.   Cardiovascular: Negative for chest pain and palpitations.  Gastrointestinal: Negative for abdominal pain and vomiting.  Genitourinary: Negative for dysuria and hematuria.  Musculoskeletal: Negative for arthralgias and back pain.  Skin: Negative for color change and rash.  Neurological: Negative for seizures and syncope.  All other systems reviewed and are negative.   Physical Exam Updated Vital Signs BP 125/87   Pulse (!) 119   Temp 100.3 F (37.9 C) (Rectal)   Resp (!) 21   SpO2 95%   Physical Exam Vitals and nursing note reviewed.  Constitutional:      Appearance: He is well-developed. He is ill-appearing and toxic-appearing. He is not diaphoretic.     Comments: Chronically ill appearing   HENT:     Head: Normocephalic and atraumatic.     Mouth/Throat:     Mouth: Mucous membranes are moist.     Pharynx: Oropharynx is clear.  Eyes:     Conjunctiva/sclera: Conjunctivae normal.     Pupils: Pupils are equal,  round, and reactive to light.  Neck:     Comments: C-collar in place, from the limited exam, no midline tenderness Cardiovascular:     Rate and Rhythm: Normal rate and regular rhythm.     Heart sounds: No murmur heard.   Pulmonary:     Effort: Pulmonary effort is normal. Tachypnea present. No respiratory distress.     Breath sounds: Decreased breath sounds, rhonchi and rales present.  Chest:     Chest wall: No tenderness.  Abdominal:     Palpations: Abdomen is soft.     Tenderness: There is no abdominal tenderness.  Musculoskeletal:     Cervical back: Normal range of motion and neck supple.     Right lower leg: No tenderness. No edema.     Left lower leg:  No tenderness. No edema.     Comments: Mild-moderate nonpitting UE edema  Skin:    General: Skin is warm and dry.     Findings: No ecchymosis or erythema.     Comments: No clear evidence of infection to the tracheostomy wound from the limited exam  Neurological:     General: No focal deficit present.     Comments: Quadriplegic      ED Results / Procedures / Treatments   Labs (all labs ordered are listed, but only abnormal results are displayed) Labs Reviewed  LACTIC ACID, PLASMA - Abnormal; Notable for the following components:      Result Value   Lactic Acid, Venous 2.7 (*)    All other components within normal limits  COMPREHENSIVE METABOLIC PANEL - Abnormal; Notable for the following components:   Sodium 147 (*)    Glucose, Bld 158 (*)    Calcium 8.5 (*)    Total Protein 6.2 (*)    Albumin 2.2 (*)    AST 81 (*)    ALT 157 (*)    Total Bilirubin 1.7 (*)    All other components within normal limits  CBC WITH DIFFERENTIAL/PLATELET - Abnormal; Notable for the following components:   WBC 19.4 (*)    Platelets 133 (*)    Neutro Abs 16.8 (*)    Monocytes Absolute 1.4 (*)    Abs Immature Granulocytes 0.28 (*)    All other components within normal limits  PROTIME-INR - Abnormal; Notable for the following components:    Prothrombin Time 19.1 (*)    INR 1.7 (*)    All other components within normal limits  URINALYSIS, ROUTINE W REFLEX MICROSCOPIC - Abnormal; Notable for the following components:   Color, Urine AMBER (*)    APPearance CLOUDY (*)    Hgb urine dipstick MODERATE (*)    Protein, ur 30 (*)    Leukocytes,Ua LARGE (*)    WBC, UA >50 (*)    Bacteria, UA MANY (*)    All other components within normal limits  I-STAT VENOUS BLOOD GAS, ED - Abnormal; Notable for the following components:   pH, Ven 7.449 (*)    Bicarbonate 32.1 (*)    TCO2 33 (*)    Acid-Base Excess 6.0 (*)    Sodium 150 (*)    Calcium, Ion 1.11 (*)    HCT 65.0 (*)    Hemoglobin 22.1 (*)    All other components within normal limits  CULTURE, BLOOD (ROUTINE X 2)  CULTURE, BLOOD (ROUTINE X 2)  URINE CULTURE  APTT  LACTIC ACID, PLASMA    EKG EKG Interpretation  Date/Time:  Wednesday May 03 2020 13:06:29 EST Ventricular Rate:  124 PR Interval:    QRS Duration: 88 QT Interval:  310 QTC Calculation: 446 R Axis:   136 Text Interpretation: Sinus tachycardia Multiple premature complexes, vent & supraven Consider right atrial enlargement Right axis deviation No significant change since prior 10/21 Confirmed by Meridee Score [phone]) on 05/03/2020 1:12:30 PM   Radiology CT Head Wo Contrast  Result Date: 05/03/2020 CLINICAL DATA:  Facial trauma after multiple falls. EXAM: CT HEAD WITHOUT CONTRAST CT CERVICAL SPINE WITHOUT CONTRAST TECHNIQUE: Multidetector CT imaging of the head and cervical spine was performed following the standard protocol without intravenous contrast. Multiplanar CT image reconstructions of the cervical spine were also generated. COMPARISON:  April 21, 2020. FINDINGS: CT HEAD FINDINGS Brain: Mild chronic ischemic white matter disease is noted. No mass effect or midline shift is  noted. Ventricular size is within normal limits. There is no evidence of mass lesion, hemorrhage or acute infarction.  Vascular: No hyperdense vessel or unexpected calcification. Skull: Normal. Negative for fracture or focal lesion. Sinuses/Orbits: No acute finding. Other: None. CT CERVICAL SPINE FINDINGS Alignment: Normal. Skull base and vertebrae: No acute fracture is noted. Postsurgical changes are seen involving the C3, C4 and C5 vertebral bodies. Soft tissues and spinal canal: No prevertebral fluid or swelling. No visible canal hematoma. Disc levels: Status post surgical anterior fusion of C3-4 and C4-5. There is fusion of the C5-6 and C6-7 disc spaces secondary to degenerative change. Upper chest: Negative. Other: None. IMPRESSION: 1. Mild chronic ischemic white matter disease. No acute intracranial abnormality seen. 2. Postsurgical and degenerative changes as described above. No acute abnormality seen in the cervical spine. Electronically Signed   By: Lupita Raider M.D.   On: 05/03/2020 14:41   CT Cervical Spine Wo Contrast  Result Date: 05/03/2020 CLINICAL DATA:  Facial trauma after multiple falls. EXAM: CT HEAD WITHOUT CONTRAST CT CERVICAL SPINE WITHOUT CONTRAST TECHNIQUE: Multidetector CT imaging of the head and cervical spine was performed following the standard protocol without intravenous contrast. Multiplanar CT image reconstructions of the cervical spine were also generated. COMPARISON:  April 21, 2020. FINDINGS: CT HEAD FINDINGS Brain: Mild chronic ischemic white matter disease is noted. No mass effect or midline shift is noted. Ventricular size is within normal limits. There is no evidence of mass lesion, hemorrhage or acute infarction. Vascular: No hyperdense vessel or unexpected calcification. Skull: Normal. Negative for fracture or focal lesion. Sinuses/Orbits: No acute finding. Other: None. CT CERVICAL SPINE FINDINGS Alignment: Normal. Skull base and vertebrae: No acute fracture is noted. Postsurgical changes are seen involving the C3, C4 and C5 vertebral bodies. Soft tissues and spinal canal: No  prevertebral fluid or swelling. No visible canal hematoma. Disc levels: Status post surgical anterior fusion of C3-4 and C4-5. There is fusion of the C5-6 and C6-7 disc spaces secondary to degenerative change. Upper chest: Negative. Other: None. IMPRESSION: 1. Mild chronic ischemic white matter disease. No acute intracranial abnormality seen. 2. Postsurgical and degenerative changes as described above. No acute abnormality seen in the cervical spine. Electronically Signed   By: Lupita Raider M.D.   On: 05/03/2020 14:41   DG Chest Port 1 View  Result Date: 05/03/2020 CLINICAL DATA:  Questionable sepsis. EXAM: PORTABLE CHEST 1 VIEW COMPARISON:  01/09/2020. FINDINGS: The heart size and mediastinal contours are within normal limits. Low lung volumes. Left basilar opacities. No visible pleural effusions or pneumothorax. Elevated right hemidiaphragm. No acute osseous abnormality. Bilateral shoulder degenerative change. Partially imaged cervical ACDF. IMPRESSION: Low lung volumes with left basilar opacities, which may represent atelectasis, aspiration and/or pneumonia. Dedicated PA and lateral radiographs could better characterize if clinically indicated. Electronically Signed   By: Feliberto Harts MD   On: 05/03/2020 13:12    Procedures .Critical Care Performed by: Mare Ferrari, PA-C Authorized by: Mare Ferrari, PA-C   Critical care provider statement:    Critical care time (minutes):  45   Critical care was necessary to treat or prevent imminent or life-threatening deterioration of the following conditions:  Respiratory failure and sepsis   Critical care was time spent personally by me on the following activities:  Discussions with consultants, evaluation of patient's response to treatment, examination of patient, ordering and performing treatments and interventions, ordering and review of laboratory studies, ordering and review of radiographic studies, pulse oximetry, re-evaluation  of patient's  condition, obtaining history from patient or surrogate and review of old charts   (including critical care time)  Medications Ordered in ED Medications  lactated ringers infusion ( Intravenous New Bag/Given 05/03/20 1305)  cefTRIAXone (ROCEPHIN) 1 g in sodium chloride 0.9 % 100 mL IVPB (0 g Intravenous Stopped 05/03/20 1347)  azithromycin (ZITHROMAX) 500 mg in sodium chloride 0.9 % 250 mL IVPB (0 mg Intravenous Stopped 05/03/20 1507)  pantoprazole (PROTONIX) EC tablet 40 mg (has no administration in time range)  polyethylene glycol (MIRALAX / GLYCOLAX) packet 17 g (has no administration in time range)  tamsulosin (FLOMAX) capsule 0.4 mg (has no administration in time range)  acetaminophen (TYLENOL) tablet 650 mg (has no administration in time range)    Or  acetaminophen (TYLENOL) suppository 650 mg (has no administration in time range)  ondansetron (ZOFRAN) tablet 4 mg (has no administration in time range)    Or  ondansetron (ZOFRAN) injection 4 mg (has no administration in time range)  lactated ringers bolus 1,000 mL (0 mLs Intravenous Stopped 05/03/20 1507)    And  lactated ringers bolus 1,000 mL (0 mLs Intravenous Stopped 05/03/20 1507)    And  lactated ringers bolus 1,000 mL (1,000 mLs Intravenous New Bag/Given 05/03/20 1442)    ED Course  I have reviewed the triage vital signs and the nursing notes.  Pertinent labs & imaging results that were available during my care of the patient were reviewed by me and considered in my medical decision making (see chart for details).  Clinical Course as of May 03 1516  Wed May 03, 2020  [id number] 74 year old transfer from rehab center for pneumonia by chest x-ray that they had performed.  History of paraplegia secondary to fall and neck injury.  He is tachypneic and requiring oxygen.  Getting labs IV fluids antibiotics.  Will need admission.   [MB]    Clinical Course User Index [MB] Terrilee FilesButler, Michael C, MD   MDM Rules/Calculators/A&P                          74 year old male who presents with a septic presentation.  He arrived tachypneic, borderline febrile at rectal temp of 100.3, tachycardic but not hypotensive.  His lungs sound rhonchorous.  He is a quadriplegic.  Suspect respiratory source as per reported by facility  Labs reviewed and interpreted by me His CBC shows a white count of 19.4, platelet count of 133.  His PT and INR are elevated.  CMP with mild hyponatremia of 147, glucose of 158, transaminitis of a AST of 81 and ALT of 157.  Normal renal function.  Normal anion gap.  His initial lactic was 2.7.  Venous blood gas shows a pH of 7.449, his hemoglobin of 22.1.  Imaging ordered, reviewed and interpreted by me CT of the head and C-spine were normal given the recent fall.  Chest x-ray with left lobar basilar opacity which may represent atelectasis, aspiration or pneumonia.  Will order CT of the chest, patient is allergic to iodine and as per CT cannot receive contrast.  This is still pending   MDM: Patient given tachycardia, hypoxia, fever and known source of infection, will a code sepsis with order set was initiated.  Fluids, respiratory to biotics started.  Patient will need admission, Dr. Ella JubileeArrien with the hospitalist team will, and evaluate the patient as per discussion.  He will follow up on the CT of the chest without contrast.  Patient was  seen and evaluated by Dr. Aubery Lapping who is agreeable to the above plan and disposition.  Final Clinical Impression(s) / ED Diagnoses Final diagnoses:  Sepsis with acute hypoxic respiratory failure without septic shock, due to unspecified organism [facility])  Elevated LFTs    Rx / DC Orders ED Discharge Orders    None       Leone Brand 05/03/20 1518    Terrilee Files, MD 05/03/20 1918    Mare Ferrari, PA-C 05/04/20 1247    Terrilee Files, MD 05/04/20 1546

## 2020-05-03 NOTE — Consult Note (Addendum)
WOC Nurse Consult Note: Reason for Consult: POA pressure injury, DTPI in evolution with discoloration between the gluteal cleft over coccygeal prominence. See photo taken by ED provider and uploaded into the EMR. 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  Wound type:Pressure, moisture (perspiration) Pressure Injury POA: Yes Measurement: To be obtained by bedside RN today prior to application of first dressing per order Wound EHO:[id number] tissues (more deeply pigmented) with small area of tissue sloughing at gluteal cleft. Dry. Drainage (amount, consistency, odor) None Periwound: intact, moist from perspiration Dressing procedure/placement/frequency: I will provide guidance for Nursing for placement of a mattress replacement with low air loss feature to mitigate pressure and moisture risk factors. Turning and repositioning per house protocol is in place and will continue. We will add bilateral pressure redistribution heel boots for floatation of heels and prevention of pressure injuries. Topical care will be with a single layer of xeroform gauze topped with dry gauze and covered with a silicone foam dressing for the sacrum. Time in the supine position is to be minimized and the [facility] at or below a 30 degree angle as tolerated for respiratory status.  WOC nursing team will not follow, but will remain available to this patient, the nursing and medical teams.  Please re-consult if needed. Thanks, Ladona Mow, MSN, RN, GNP, [place]  Pager# [phone]

## 2020-05-03 NOTE — ED Notes (Signed)
Patient transported to CT 

## 2020-05-03 NOTE — H&P (Addendum)
History and Physical    Shannon [name] IDP:[id number] DOB: 1946/05/02 DOA: 05/03/2020  PCP: [name], MD   Patient coming from: SNF   Chief Complaint: dyspnea.   HPI: Shannon [name] is a 74 y.o. 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  At the rehab patient sustained a mechanical fall, suffering cervical fracture and cervical epidural hematoma, he was admitted on 10/29 to 04/28/2020 with a working diagnosis of cervical fracture with surgical epidural hematoma, quadriparesis and central cord syndrome.  He underwent revision of his fusion and evacuation of epidural hematoma.  For the last 4 days patient has been not feeling well, with a generalized malaise, cough and congestion.  Per his niece at the bedside he had difficulty swallowing, no recurrent falls.  He does have a pressure wound at his sacrum which has been treated with local wound care. He does have a chronic indwelling Foley catheter.  Today patient was noted to have significant increased work of breathing and tachycardia, no associated pain, no improving or worsening factors.  Due to persistent symptoms he was referred to the hospital for further evaluation.   ED Course:   Patient was ill looking appearing, he had positive leukocytosis and possible pneumonia.  He received 3 L of lactated Ringer's intravenously, antibiotic therapy with ceftriaxone and azithromycin.  He was referred for further hospitalization.  Review of Systems:  1. General: subjective fevers and chills, no weight gain or weight loss 2. ENT: No runny nose or sore throat, no hearing disturbances 3. Pulmonary: No dyspnea, cough, wheezing, or hemoptysis 4. Cardiovascular: No angina, claudication, lower extremity edema, pnd or orthopnea 5. Gastrointestinal: No nausea or vomiting, no  diarrhea or constipation 6. Hematology: No easy bruisability or frequent infections 7. Urology: No dysuria, hematuria or increased urinary frequency. Foley catheter in place.  8. Dermatology: No rashes. 9. Neurology: no seizures, positive quadriparesis.  10. Musculoskeletal: No joint pain or deformities  Past Medical History:  Diagnosis Date  . Asthma   . 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 Newell Coral, 1992-->surg at [facility].  Summer 2021->Cervicalgia,arm pain, hand atrophy RUE wkness, hyperreflex (Dr. 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 Hay fever   . Hypertension   . Osteoarthritis of both hands     Past Surgical History:  Procedure Laterality Date  . ANTERIOR CERVICAL DECOMP/DISCECTOMY FUSION N/A 04/05/2020   Procedure: ANTERIOR CERVICAL DECOMPRESSION/DISCECTOMY FUSION, INTERBODY PROSTHESIS, PLATE/SCREWS CERVICAL THREE-CERVICAL FOUR, CERVICAL FOUR- CERVICAL FIVE;  Surgeon: Tressie Stalker, MD;  Location: [facility] OR;  Service: Neurosurgery;  Laterality: N/A;  . ANTERIOR CERVICAL DECOMP/DISCECTOMY FUSION N/A 04/22/2020   Procedure: Reexploration of anterior cervical wound for epidural hematoma;  Surgeon: Donalee Citrin, MD;  Location: [facility] OR;  Service: Neurosurgery;  Laterality: N/A;  . BACK SURGERY    . INCISION AND DRAINAGE Left 2012   L hand infection  . ROTATOR CUFF REPAIR Right    x 3  . SPINE SURGERY       reports that he has quit smoking. He has never used smokeless tobacco. He reports previous alcohol use. He reports that he does not use drugs.  Allergies  Allergen Reactions  . Iodine Swelling  . Penicillins Swelling    Facial swelling, itchy throat  Family History  Problem Relation Age of Onset  . Heart attack Mother   . Heart disease Mother   . High blood pressure Mother   . Alcohol abuse Father   . Cancer Father   .  Diabetes Father   . High blood pressure Father      Prior to Admission medications   Medication Sig Start Date End Date Taking? Authorizing Provider  acetaminophen (TYLENOL) 500 MG tablet Take 1,000 mg by mouth in the morning, at noon, and at bedtime.   Yes [provider]  amLODipine (NORVASC) 10 MG tablet Take 1 tablet (10 mg total) by mouth daily. 02/16/20  Yes McGowen, Maryjean Morn, MD  apixaban (ELIQUIS) 2.5 MG TABS tablet Take 1 tablet (2.5 mg total) by mouth 2 (two) times daily. 04/19/20  Yes Love, [name], PA-C  docusate sodium (COLACE) 100 MG capsule Take 1 capsule (100 mg total) by mouth 2 (two) times daily. 04/19/20  Yes Love, [name], PA-C  oxyCODONE (OXY IR/ROXICODONE) 5 MG immediate release tablet Take 5 mg by mouth daily as needed for severe pain.   Yes [provider]  pantoprazole (PROTONIX) 40 MG tablet Take 1 tablet (40 mg total) by mouth at bedtime. 04/19/20  Yes Love, [name], PA-C  polyethylene glycol (MIRALAX / GLYCOLAX) 17 g packet Take 17 g by mouth daily.   Yes [provider]  tamsulosin (FLOMAX) 0.4 MG CAPS capsule Take 1 capsule (0.4 mg total) by mouth daily. 04/28/20  Yes Tressie Stalker, MD    Physical Exam: Vitals:   05/03/20 1315 05/03/20 1330 05/03/20 1345 05/03/20 1400  BP: 118/80 118/79 115/79 125/87  Pulse: (!) 122 (!) 119 (!) 120 (!) 119  Resp: (!) 29 (!) 28 (!) 28 (!) 21  Temp:      TempSrc:      SpO2: 94% 94% 95% 95%    Vitals:   05/03/20 1315 05/03/20 1330 05/03/20 1345 05/03/20 1400  BP: 118/80 118/79 115/79 125/87  Pulse: (!) 122 (!) 119 (!) 120 (!) 119  Resp: (!) 29 (!) 28 (!) 28 (!) 21  Temp:      TempSrc:      SpO2: 94% 94% 95% 95%   General: deconditioned and ill looking appearing  Neurology: Awake and alert, non focal Head and Neck. Rigid cervical collar.  Neck supple with no adenopathy or thyromegaly.   E ENT: mild pallor, no icterus, oral mucosa moist Cardiovascular: No JVD. S1-S2 present, rhythmic, no  gallops, rubs, or murmurs. No lower extremity edema. Pulmonary: positive breath sounds bilaterally, positive rhonchi bilaterally, with scattered rales, no wheezing. Gastrointestinal. Abdomen soft and non tender Skin. Stage 1-2 linear 3 cm pressure ulcer, no purulence or erythema.  Musculoskeletal: no joint deformities Foley catheter in place with dark yellow urine, no sediment.        Labs on Admission: I have personally reviewed following labs and imaging studies  CBC: Recent Labs  Lab 05/03/20 1243 05/03/20 1321  WBC 19.4*  --   NEUTROABS 16.8*  --   HGB 14.8 22.1*  HCT 47.4 65.0*  MCV 92.2  --   PLT 133*  --    Basic Metabolic Panel: Recent Labs  Lab 05/03/20 1243 05/03/20 1321  NA 147* 150*  K 4.2 4.3  CL 111  --   CO2 27  --   GLUCOSE 158*  --   BUN 20  --   CREATININE 1.15  --   CALCIUM 8.5*  --  GFR: Estimated Creatinine Clearance: 63.9 mL/min (by C-G formula based on SCr of 1.15 mg/dL). Liver Function Tests: Recent Labs  Lab 05/03/20 1243  AST 81*  ALT 157*  ALKPHOS 90  BILITOT 1.7*  PROT 6.2*  ALBUMIN 2.2*   No results for input(s): LIPASE, AMYLASE in the last 168 hours. No results for input(s): AMMONIA in the last 168 hours. Coagulation Profile: Recent Labs  Lab 05/03/20 1243  INR 1.7*   Cardiac Enzymes: No results for input(s): CKTOTAL, CKMB, CKMBINDEX, TROPONINI in the last 168 hours. BNP (last 3 results) No results for input(s): PROBNP in the last 8760 hours. HbA1C: No results for input(s): HGBA1C in the last 72 hours. CBG: No results for input(s): GLUCAP in the last 168 hours. Lipid Profile: No results for input(s): CHOL, HDL, LDLCALC, TRIG, CHOLHDL, LDLDIRECT in the last 72 hours. Thyroid Function Tests: No results for input(s): TSH, T4TOTAL, FREET4, T3FREE, THYROIDAB in the last 72 hours. Anemia Panel: No results for input(s): VITAMINB12, FOLATE, FERRITIN, TIBC, IRON, RETICCTPCT in the last 72 hours. Urine analysis:     Component Value Date/Time   COLORURINE YELLOW 04/21/2020 1523   APPEARANCEUR HAZY (A) 04/21/2020 1523   LABSPEC 1.011 04/21/2020 1523   PHURINE 5.0 04/21/2020 1523   GLUCOSEU NEGATIVE 04/21/2020 1523   HGBUR SMALL (A) 04/21/2020 1523   BILIRUBINUR NEGATIVE 04/21/2020 1523   KETONESUR 20 (A) 04/21/2020 1523   PROTEINUR NEGATIVE 04/21/2020 1523   NITRITE NEGATIVE 04/21/2020 1523   LEUKOCYTESUR LARGE (A) 04/21/2020 1523    Radiological Exams on Admission: CT Head Wo Contrast  Result Date: 05/03/2020 CLINICAL DATA:  Facial trauma after multiple falls. EXAM: CT HEAD WITHOUT CONTRAST CT CERVICAL SPINE WITHOUT CONTRAST TECHNIQUE: Multidetector CT imaging of the head and cervical spine was performed following the standard protocol without intravenous contrast. Multiplanar CT image reconstructions of the cervical spine were also generated. COMPARISON:  April 21, 2020. FINDINGS: CT HEAD FINDINGS Brain: Mild chronic ischemic white matter disease is noted. No mass effect or midline shift is noted. Ventricular size is within normal limits. There is no evidence of mass lesion, hemorrhage or acute infarction. Vascular: No hyperdense vessel or unexpected calcification. Skull: Normal. Negative for fracture or focal lesion. Sinuses/Orbits: No acute finding. Other: None. CT CERVICAL SPINE FINDINGS Alignment: Normal. Skull base and vertebrae: No acute fracture is noted. Postsurgical changes are seen involving the C3, C4 and C5 vertebral bodies. Soft tissues and spinal canal: No prevertebral fluid or swelling. No visible canal hematoma. Disc levels: Status post surgical anterior fusion of C3-4 and C4-5. There is fusion of the C5-6 and C6-7 disc spaces secondary to degenerative change. Upper chest: Negative. Other: None. IMPRESSION: 1. Mild chronic ischemic white matter disease. No acute intracranial abnormality seen. 2. Postsurgical and degenerative changes as described above. No acute abnormality seen in the  cervical spine. Electronically Signed   By: Lupita RaiderJames  Green Jr M.D.   On: 05/03/2020 14:41   CT Cervical Spine Wo Contrast  Result Date: 05/03/2020 CLINICAL DATA:  Facial trauma after multiple falls. EXAM: CT HEAD WITHOUT CONTRAST CT CERVICAL SPINE WITHOUT CONTRAST TECHNIQUE: Multidetector CT imaging of the head and cervical spine was performed following the standard protocol without intravenous contrast. Multiplanar CT image reconstructions of the cervical spine were also generated. COMPARISON:  April 21, 2020. FINDINGS: CT HEAD FINDINGS Brain: Mild chronic ischemic white matter disease is noted. No mass effect or midline shift is noted. Ventricular size is within normal limits. There is no evidence of mass lesion,  hemorrhage or acute infarction. Vascular: No hyperdense vessel or unexpected calcification. Skull: Normal. Negative for fracture or focal lesion. Sinuses/Orbits: No acute finding. Other: None. CT CERVICAL SPINE FINDINGS Alignment: Normal. Skull base and vertebrae: No acute fracture is noted. Postsurgical changes are seen involving the C3, C4 and C5 vertebral bodies. Soft tissues and spinal canal: No prevertebral fluid or swelling. No visible canal hematoma. Disc levels: Status post surgical anterior fusion of C3-4 and C4-5. There is fusion of the C5-6 and C6-7 disc spaces secondary to degenerative change. Upper chest: Negative. Other: None. IMPRESSION: 1. Mild chronic ischemic white matter disease. No acute intracranial abnormality seen. 2. Postsurgical and degenerative changes as described above. No acute abnormality seen in the cervical spine. Electronically Signed   By: Lupita Raider M.D.   On: 05/03/2020 14:41   DG Chest Port 1 View  Result Date: 05/03/2020 CLINICAL DATA:  Questionable sepsis. EXAM: PORTABLE CHEST 1 VIEW COMPARISON:  01/09/2020. FINDINGS: The heart size and mediastinal contours are within normal limits. Low lung volumes. Left basilar opacities. No visible pleural effusions  or pneumothorax. Elevated right hemidiaphragm. No acute osseous abnormality. Bilateral shoulder degenerative change. Partially imaged cervical ACDF. IMPRESSION: Low lung volumes with left basilar opacities, which may represent atelectasis, aspiration and/or pneumonia. Dedicated PA and lateral radiographs could better characterize if clinically indicated. Electronically Signed   By: Feliberto Harts MD   On: 05/03/2020 13:12    EKG: Independently reviewed.  124 bpm, rightward axis, normal intervals, sinus rhythm with PACs and PVCs, no ST segment or T wave changes.  Assessment/Plan Principal Problem:   Sepsis secondary to UTI [facility]) Active Problems:   Cervical spondylosis with myelopathy and radiculopathy   Quadriparesis (HCC)   Benign essential HTN   Cervical myelopathy (HCC)   Urinary retention   Chronic indwelling Foley catheter   75 year old male with cervical injury s/p discectomy, decompression and hematoma evacuation over the last month.  Reports with 4 days of not feeling well, along with cough and congestion.  He has shown difficulty swallowing.  Today he was found in respiratory distress and transferred to the hospital.  On his initial physical examination his blood pressure 115/75, temperature 38.1 C, respiratory rate 25, heart rate 123, oxygen saturation 92% on room air.  He has a rigid cervical collar in place, his lungs had rhonchi bilaterally, heart S1-S2, present, tachycardic, soft abdomen, no lower extremity edema. Sodium 147, potassium 4.2, chloride 111, bicarb 27, glucose 158, BUN 20, creatinine 1.15, AST 81, ALT 157, lactic acid 2.7, white count 19.4, hemoglobin 14.8, hematocrit 47.8, platelets 133.  SARS COVID-19 pending.  Urinalysis more than 50 white cells, 21-50 red cells, specific gravity 1.018, 30 protein. Chest radiograph with right hemidiaphragm elevation, bibasilar atelectasis.  Head and cervical CT scan no acute changes.  Shannon [name] will be hospitalized with a  working diagnosis of sepsis due to urinary tract infection (tachycardia, fever and leukocytosis), related to chronic indwelling Foley catheter, present on admission.  1.  Sepsis due to urinary tract infection, present on admission, related to chronic indwelling Foley catheter.  Patient will be admitted to the medical telemetry ward, continue supportive medical therapy with intravenous fluids and broad-spectrum antibiotic therapy with IV ceftriaxone.  Follow-up on cultures, cell count and temperature curve.   2.  Hypernatremia, elevated liver enzymes due to dehydration/hypoperfusion.  Continue hydration with half normal saline with dextrose at 100 mL/h, close follow-up of urine output, kidney function and electrolytes.  3.  Quadriplegia.  Continue aspiration  and fall precautions.  Consult speech therapy, physical therapy, occupational therapy and nutrition.  4.  Stage I-II sacrum decubitus ulcer.  Present on admission, continue local wound care. Consult wound care team.   5. HTN. Hold on amlodipine due to risk of hypotension, continue close blood pressure monitoring.   6. BPH. Continue with tamsulosin. Foley in place.   Status is: Inpatient  Remains inpatient appropriate because:IV treatments appropriate due to intensity of illness or inability to take PO   Dispo: The patient is from: SNF              Anticipated d/c is to: SNF              Anticipated d/c date is: > 3 days              Patient currently is not medically stable to d/c.    DVT prophylaxis: Enoxaparin   Code Status:   full  Family Communication:  I spoke with patient's nice at the bedside, we talked in detail about patient's condition, plan of care and prognosis and all questions were addressed.     Consults called:  None   Admission status:   inpatient    Dawsen Krieger Annett Gula MD [facility]   05/03/2020, 3:06 PM

## 2020-05-03 NOTE — ED Notes (Signed)
This Rn notified MD of lactic 2.5 if any further treatments were needed

## 2020-05-03 NOTE — ED Triage Notes (Addendum)
Pt BIB EMS from [facility]. Pt has recurrent falls.Per EMS facility reports increase work of breathing and increase heart rate. Pt is A&ox4. . Pt has foley at baseline. Pt is tachy on arrive with productive cough.Pt reports ob on arrival. Pt is quadriplegic at baseline.

## 2020-05-04 ENCOUNTER — Inpatient Hospital Stay (HOSPITAL_COMMUNITY): Payer: Medicare Other

## 2020-05-04 DIAGNOSIS — L8990 Pressure ulcer of unspecified site, unspecified stage: Secondary | ICD-10-CM | POA: Insufficient documentation

## 2020-05-04 DIAGNOSIS — A419 Sepsis, unspecified organism: Secondary | ICD-10-CM

## 2020-05-04 DIAGNOSIS — N390 Urinary tract infection, site not specified: Secondary | ICD-10-CM | POA: Diagnosis not present

## 2020-05-04 LAB — BASIC METABOLIC PANEL
Anion gap: 7 (ref 5–15)
BUN: 19 mg/dL (ref 8–23)
CO2: 25 mmol/L (ref 22–32)
Calcium: 8 mg/dL — ABNORMAL LOW (ref 8.9–10.3)
Chloride: 113 mmol/L — ABNORMAL HIGH (ref 98–111)
Creatinine, Ser: 0.82 mg/dL (ref 0.61–1.24)
GFR, Estimated: >60 mL/min (ref >60)
Glucose, Bld: 167 mg/dL — ABNORMAL HIGH (ref 70–99)
Potassium: 3.8 mmol/L (ref 3.5–5.1)
Sodium: 145 mmol/L (ref 135–145)

## 2020-05-04 LAB — RESPIRATORY PANEL BY RT PCR (FLU A&B, COVID)
Influenza A by PCR: NEGATIVE
Influenza B by PCR: NEGATIVE
SARS Coronavirus 2 by RT PCR: NEGATIVE

## 2020-05-04 LAB — CBC
HCT: 40.5 % (ref 39.0–52.0)
Hemoglobin: 12.9 g/dL — ABNORMAL LOW (ref 13.0–17.0)
MCH: 28.4 pg (ref 26.0–34.0)
MCHC: 31.9 g/dL (ref 30.0–36.0)
MCV: 89 fL (ref 80.0–100.0)
Platelets: 117 10*3/uL — ABNORMAL LOW (ref 150–400)
RBC: 4.55 MIL/uL (ref 4.22–5.81)
RDW: 13.8 % (ref 11.5–15.5)
WBC: 20.5 10*3/uL — ABNORMAL HIGH (ref 4.0–10.5)
nRBC: 0 % (ref 0.0–0.2)

## 2020-05-04 LAB — GLUCOSE, CAPILLARY: Glucose-Capillary: 115 mg/dL — ABNORMAL HIGH (ref 70–99)

## 2020-05-04 LAB — LACTIC ACID, PLASMA
Lactic Acid, Venous: 1.4 mmol/L (ref 0.5–1.9)
Lactic Acid, Venous: 2.1 mmol/L (ref 0.5–1.9)

## 2020-05-04 LAB — BRAIN NATRIURETIC PEPTIDE: B Natriuretic Peptide: 345.1 pg/mL — ABNORMAL HIGH (ref 0.0–100.0)

## 2020-05-04 MED ORDER — PANTOPRAZOLE SODIUM 40 MG IV SOLR
40.0000 mg | INTRAVENOUS | Status: DC
  Administered 2020-05-04 – 2020-05-09 (×6): 40 mg via INTRAVENOUS
  Filled 2020-05-04 (×6): qty 40

## 2020-05-04 MED ORDER — SODIUM CHLORIDE 0.9 % IV SOLN
2.0000 g | Freq: Three times a day (TID) | INTRAVENOUS | Status: AC
  Administered 2020-05-04 – 2020-05-09 (×17): 2 g via INTRAVENOUS
  Filled 2020-05-04 (×17): qty 2

## 2020-05-04 MED ORDER — CHLORHEXIDINE GLUCONATE CLOTH 2 % EX PADS
6.0000 | MEDICATED_PAD | Freq: Every day | CUTANEOUS | Status: DC
  Administered 2020-05-04 – 2020-05-11 (×8): 6 via TOPICAL

## 2020-05-04 MED ORDER — METRONIDAZOLE 500 MG PO TABS
500.0000 mg | ORAL_TABLET | Freq: Three times a day (TID) | ORAL | Status: AC
  Administered 2020-05-04 – 2020-05-09 (×16): 500 mg via ORAL
  Filled 2020-05-04 (×19): qty 1

## 2020-05-04 NOTE — Evaluation (Signed)
Physical Therapy Evaluation Patient Details Name: Shannon [name] MRN: [id number] DOB: 17-Jan-1946 Today's Date: 05/04/2020   History of Present Illness  74 yo male with onset of increased numbness and weakness on extremities was admitted and per neurosurgeon, has new hematoma on spine after having anterior cervical decompression and discectomy fusion with interbody prosthesis and hardware C3-C4-C5.  PMHx:  quadriparesis, Chiari malformation with syringomyelia, RUE weakness, hay fever, HTN, OA hands, MS, rotator cuff repair,   Clinical Impression  Pt was seen for bed level eval due to lethargic appearance, limited communication and dense weakness.  Pt is willing to work, following commands for LE and UE movement but rapidly fatigues and loses feeling of the movement.  Will work toward his increased independence to move to sit up, to stand and to get up in a chair.  Pt was apparently in the past walking and will see if he can tolerate any of this if strength evolves with his stay.  SNF stay is appropriate for return to rehab, since he is not going to be able to do three hours of PT and OT for a stay in CIR.  If this changes, will reconsider DC recommendations.    Follow Up Recommendations SNF    Equipment Recommendations  Wheelchair (measurements PT);Wheelchair cushion (measurements PT);Hospital bed;Other (comment)    Recommendations for Other Services       Precautions / Restrictions Precautions Precautions: Fall;Cervical Precaution Booklet Issued: No Precaution Comments: log roll in and out of bed Required Braces or Orthoses: Cervical Brace Cervical Brace: Hard collar;At all times Restrictions Weight Bearing Restrictions: No      Mobility  Bed Mobility Overal bed mobility: Needs Assistance Bed Mobility: Rolling Rolling: Total assist Sidelying to sit: Total assist;+2 for physical assistance;+2 for safety/equipment       General bed mobility comments: pt requires two person  assist to sit up    Transfers                    Ambulation/Gait             General Gait Details: unable to attempt  Stairs            Wheelchair Mobility    Modified Rankin (Stroke Patients Only)       Balance                                             Pertinent Vitals/Pain Pain Assessment: Faces Faces Pain Scale: No hurt    Home Living Family/patient expects to be discharged to:: Skilled nursing facility Living Arrangements: Alone Available Help at Discharge: Family;Available 24 hours/day Type of Home: House Home Access: Stairs to enter Entrance Stairs-Rails: Right Entrance Stairs-Number of Steps: 5-6 Home Layout: One level Home Equipment: Walker - 4 wheels      Prior Function Level of Independence: Needs assistance   Gait / Transfers Assistance Needed: per prev notes was walking on RW which pt nods yes to question from PT  ADL's / Homemaking Assistance Needed: family and rehab aide        Hand Dominance   Dominant Hand: Right    Extremity/Trunk Assessment   Upper Extremity Assessment Upper Extremity Assessment: RUE deficits/detail RUE Deficits / Details: grossly grasps all fingers, has IR and shoulder ext, triceps LUE Deficits / Details: gross weaker grasp of long and ring finger, weak  triceps, IR shoulder and light wrist flexion    Lower Extremity Assessment Lower Extremity Assessment: RLE deficits/detail;LLE deficits/detail RLE Deficits / Details: weak trace hip ext, knee ext, flexion knee, and DF very light LLE Deficits / Details: active DF, active hip ext, active knee ext and flexion, mild hip adduction    Cervical / Trunk Assessment Cervical / Trunk Assessment: Other exceptions (open skin from recent revision of ACDf) Cervical / Trunk Exceptions: s/p cervical surgery x2  Communication   Communication: No difficulties  Cognition Arousal/Alertness: Lethargic Behavior During Therapy: Flat  affect Overall Cognitive Status: No family/caregiver present to determine baseline cognitive functioning Area of Impairment: Following commands;Awareness;Problem solving                   Current Attention Level: Selective   Following Commands: Follows one step commands with increased time;Follows one step commands inconsistently Safety/Judgement: Decreased awareness of deficits Awareness: Intellectual Problem Solving: Slow processing;Requires verbal cues;Requires tactile cues General Comments: pt is not verbal with PT but instead nods yes and no      General Comments General comments (skin integrity, edema, etc.): pt is lethargic and weak, unable to keep his eyes open to try to sit up on side of bed.  fully dependent to roll and move on bed    Exercises     Assessment/Plan    PT Assessment Patient needs continued PT services  PT Problem List Decreased strength;Decreased range of motion;Decreased activity tolerance;Decreased balance;Decreased mobility;Decreased coordination;Cardiopulmonary status limiting activity;Decreased skin integrity       PT Treatment Interventions DME instruction;Gait training;Functional mobility training;Therapeutic activities;Therapeutic exercise;Balance training;Neuromuscular re-education;Patient/family education    PT Goals (Current goals can be found in the Care Plan section)  Acute Rehab PT Goals Patient Stated Goal: not stated this session PT Goal Formulation: Patient unable to participate in goal setting Time For Goal Achievement: 05/18/20 Potential to Achieve Goals: Fair    Frequency Min 3X/week   Barriers to discharge Inaccessible home environment;Decreased caregiver support home with limited assistance    Co-evaluation               AM-PAC PT "6 Clicks" Mobility  Outcome Measure Help needed turning from your back to your side while in a flat bed without using bedrails?: Total Help needed moving from lying on your back to  sitting on the side of a flat bed without using bedrails?: Total Help needed moving to and from a bed to a chair (including a wheelchair)?: Total Help needed standing up from a chair using your arms (e.g., wheelchair or bedside chair)?: Total Help needed to walk in hospital room?: Total Help needed climbing 3-5 steps with a railing? : Total 6 Click Score: 6    End of Session Equipment Utilized During Treatment: Cervical collar Activity Tolerance: Patient limited by fatigue Patient left: in bed;with call bell/phone within reach;with family/visitor present Nurse Communication: Mobility status PT Visit Diagnosis: Muscle weakness (generalized) (M62.81);Difficulty in walking, not elsewhere classified (R26.2);Other symptoms and signs involving the nervous system (R29.898);Other (comment) (quadriparesis)    Time: 1130-1157 PT Time Calculation (min) (ACUTE ONLY): 27 min   Charges:   PT Evaluation $PT Eval Moderate Complexity: 1 Mod PT Treatments $Therapeutic Exercise: 8-22 mins       Ivar Drape 05/04/2020, 1:12 PM  Samul Dada, PT MS Acute Rehab Dept. Number: [facility] [phone] and [facility] [phone]

## 2020-05-04 NOTE — Therapy (Signed)
No cpap unit currently in room pt unable to wear at this time will reassess daily

## 2020-05-04 NOTE — Progress Notes (Signed)
Modified Barium Swallow Progress Note  Patient Details  Name: ZEBULUN DEMAN MRN: [id number] Date of Birth: 01/24/1946  Today's Date: 05/04/2020  Modified Barium Swallow completed.  Full report located under Chart Review in the Imaging Section.  Brief recommendations include the following:  Clinical Impression  Pt presents with moderate oropharyngeal dysphagia c/b decreased base of tongue retraction, delayed swallow inititation, incomplete laryngeal closure, reduced pharyngeal perstalsis, decreased UES opening, and diminished sensation. These deficits resulted in silent aspiration of thin liquid and nectar thick liquid by straw prior to the swallow. With nectar thick liquid by cup, only transient penetration was seen during swallow. There was significant pharyngeal residue with puree and solid consistencies which was reduced but not fully cleared with liquid wash. There is noticeable prevertebral edema in pharynx which appears to be impacting swallow function, but pt is able to acheive adequate epiglottic deflection. This study represents a marked decline in swallow function from MBSS on 04/25/20; however, from review of images in both studies, amount of edema appears comparable to naked eye without any accurate measurement, or comparing level of magnification during each study. Because of recent ACDF (11/13) and revision (10/29), and presence of cervical collar, SLP intervention options are limited outside of modification of diet. Pt cannot trial compensatory strategies requiring change to head/neck positioning, and many swallow exercises cannot be executed in this position. Pt may benefit from some tongue strengthening exercises to improve swallow drive and decrease vallecula residue. SLP to follow to address dysphagia; recommend follow up MBSS when indicated given that pt is not consistently sensate to penetration/aspiration.  Recommend initiating full liquid diet with nectar thick liquids, by cup  sip only. NO STRAWS. Please crush medications if permissible.   Called neice, [name], and shared results of today's study which she will pass on to pt's daughter who will be coming to town tomorrow.   Swallow Evaluation Recommendations       SLP Diet Recommendations: Nectar thick liquid   Liquid Administration via: No straw       Supervision: Staff to assist with self feeding   Compensations: Slow rate;Small sips/bites;Follow solids with liquid   Postural Changes: Seated upright at 90 degrees;Remain semi-upright after after feeds/meals (Comment)   Oral Care Recommendations: Oral care BID        Kerrie Pleasure, MA, CCC-SLP Acute Rehabilitation Services Office: [phone] 05/04/2020,11:56 AM

## 2020-05-04 NOTE — Evaluation (Signed)
Occupational Therapy Evaluation Patient Details Name: Shannon [name] MRN: [id number] DOB: 07-13-1945 Today's Date: 05/04/2020    History of Present Illness 74 year old white male who presented with right lower lobe pneumonia and known history of  Quadriparesis.  He originally had a C3-4 [place] 4-5 anterior cervical disc asked to me with decompression, C3 3-4 and C4-5 interbody arthrodesis with local autograft bone, anterior cervical plating of C3-C5 on 04/05/2020.  He then went to rehab and was discharged.  On 04/21/2020 the patient presented to the emergency room with progressive bilateral quadriplegia.  Patient went to the operating room and had decompression of a vertebral hematoma.  And been discharged on 11 10/2019 and readmitted on 05/03/2020 for increasing shortness of breath and productive cough.  Chest x-ray demonstrated a new right lower lobe infiltrate with compressive atelectasis.   Clinical Impression   PTA pt at [facility] place for rehab- was recently admitted for above with progressive quadriparesis and additional cervical sx. At time of eval, pt able to complete bed mobility with total A +2 rolling for peri care after being incontinent with BM. Pt very lethargic, limited verbalization and engagement despite max multimodal cueing. Pt mostly only nodding "yes" to all questions. Pt familiar to this writer from previous admission, and pt was usually very verbal and animated. Performed BUE exercise as listed below. Pt is currently total A for all ADLs. Continue to note no functional usage of BUEs. Difficult to fully assess due to level of arousal. Recommend he return to SNF for continued rehab. Will continue to follow as acutely admitted.    Follow Up Recommendations  SNF;Supervision/Assistance - 24 hour    Equipment Recommendations  None recommended by OT    Recommendations for Other Services       Precautions / Restrictions Precautions Precautions: Fall;Cervical Precaution Booklet  Issued: No Precaution Comments: pt not cognitively able to recieve instructions at this time. Practiced log roll Required Braces or Orthoses: Cervical Brace Cervical Brace: Hard collar;At all times Restrictions Weight Bearing Restrictions: No      Mobility Bed Mobility Overal bed mobility: Needs Assistance Bed Mobility: Rolling Rolling: Total assist;+2 for physical assistance;+2 for safety/equipment         General bed mobility comments: total A +2 for bil rolling in bed for peri care with OT and RN. Noted no automatic initiation of BUEs during rolling despite max multimodal cues    Transfers                 General transfer comment: unable    Balance                                           ADL either performed or assessed with clinical judgement   ADL                                         General ADL Comments: Pt is currently total A for all ADLs at this time. No functional use of UEs noted. he required total A +2 for rolling for peri care after incontinent of BM in bed. Limited alertness noted     Vision Patient Visual Report: No change from baseline       Perception     Praxis      Pertinent  Vitals/Pain Pain Assessment: Faces Faces Pain Scale: Hurts little more Pain Location: generalized with rolling Pain Descriptors / Indicators: Grimacing Pain Intervention(s): Limited activity within patient's tolerance;Monitored during session;Repositioned     Hand Dominance Right   Extremity/Trunk Assessment Upper Extremity Assessment Upper Extremity Assessment: RUE deficits/detail;Difficult to assess due to impaired cognition RUE Deficits / Details: weak grasp that is not functional for picking up items. Limited arousal to test all muscle groups this session. LUE Deficits / Details: decreased weakness compared to right. Minimal movement of fingers/wrist. Difficult to fully assess due to decreased alertness.   Lower  Extremity Assessment Lower Extremity Assessment: Defer to PT evaluation       Communication Communication Communication: No difficulties   Cognition Arousal/Alertness: Lethargic Behavior During Therapy: Flat affect Overall Cognitive Status: No family/caregiver present to determine baseline cognitive functioning                                 General Comments: Pt minimally verbal throughout session, mostly only nodding "yes" to all questions. Had only stated two questions verbally with max cueing to do so. Will continue to assess cognition as alertness improves   General Comments       Exercises General Exercises - Upper Extremity Shoulder Flexion: AAROM;Supine;5 reps;Both Elbow Flexion: AAROM;Both;5 reps;Supine Elbow Extension: AAROM;Both;5 reps;Supine Wrist Flexion: PROM;Both;5 reps;Supine Wrist Extension: AROM;Both;5 reps;Supine Digit Composite Flexion: AROM;Both;5 reps;Supine   Shoulder Instructions      Home Living Family/patient expects to be discharged to:: Skilled nursing facility                                 Additional Comments: Pt has been at [facility] place for rehab since last admission. Prior to that he was d/c from CIR and home a short period of time with falls      Prior Functioning/Environment Level of Independence: Needs assistance  Gait / Transfers Assistance Needed: Requiring max A from SNF staff PTA. Was walking after d/c from CIR, but many falls ADL's / Homemaking Assistance Needed: assist from SNF staff. pt still not able to functionally feed self            OT Problem List: Decreased strength;Decreased range of motion;Impaired balance (sitting and/or standing);Decreased coordination;Decreased safety awareness;Decreased knowledge of use of DME or AE;Decreased knowledge of precautions;Impaired sensation;Impaired UE functional use;Decreased activity tolerance      OT Treatment/Interventions: Self-care/ADL  training;Therapeutic exercise;Neuromuscular education;Energy conservation;DME and/or AE instruction;Splinting;Therapeutic activities;Patient/family education;Balance training;Cognitive remediation/compensation    OT Goals(Current goals can be found in the care plan section) Acute Rehab OT Goals Patient Stated Goal: not stated this session OT Goal Formulation: With patient Time For Goal Achievement: 05/18/20 Potential to Achieve Goals: Fair  OT Frequency: Min 2X/week   Barriers to D/C:            Co-evaluation              AM-PAC OT "6 Clicks" Daily Activity     Outcome Measure Help from another person eating meals?: Total Help from another person taking care of personal grooming?: Total Help from another person toileting, which includes using toliet, bedpan, or urinal?: Total Help from another person bathing (including washing, rinsing, drying)?: Total Help from another person to put on and taking off regular upper body clothing?: Total Help from another person to put on and taking off regular lower body  clothing?: Total 6 Click Score: 6   End of Session Equipment Utilized During Treatment: Cervical collar Nurse Communication: Mobility status  Activity Tolerance: Patient limited by fatigue;Patient limited by lethargy Patient left: in bed;with call bell/phone within reach  OT Visit Diagnosis: Unsteadiness on feet (R26.81);Muscle weakness (generalized) (M62.81);Other symptoms and signs involving the nervous system (R29.898)                Time: 5329-9242 OT Time Calculation (min): 39 min Charges:  OT General Charges $OT Visit: 1 Visit OT Evaluation $OT Eval Moderate Complexity: 1 Mod OT Treatments $Self Care/Home Management : 23-37 mins  Dalphine Handing, MSOT, OTR/L Acute Rehabilitation Services [facility] Office Number: [phone] Pager: [phone]  Dalphine Handing 05/04/2020, 6:43 PM

## 2020-05-04 NOTE — Evaluation (Signed)
Clinical/Bedside Swallow Evaluation Patient Details  Name: Shannon [name] MRN: [id number] Date of Birth: [birth year]  Today's Date: 05/04/2020 Time: SLP Start Time (ACUTE ONLY): 0914 SLP Stop Time (ACUTE ONLY): 0929 SLP Time Calculation (min) (ACUTE ONLY): 15 min  Past Medical History:  Past Medical History:  Diagnosis Date  . Asthma   . 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 Newell Coral, 1992-->surg at [facility].  Summer 2021->Cervicalgia,arm pain, hand atrophy RUE wkness, hyperreflex (Dr. 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 Hay fever   . Hypertension   . Osteoarthritis of both hands    Past Surgical History:  Past Surgical History:  Procedure Laterality Date  . ANTERIOR CERVICAL DECOMP/DISCECTOMY FUSION N/A 04/05/2020   Procedure: ANTERIOR CERVICAL DECOMPRESSION/DISCECTOMY FUSION, INTERBODY PROSTHESIS, PLATE/SCREWS CERVICAL THREE-CERVICAL FOUR, CERVICAL FOUR- CERVICAL FIVE;  Surgeon: Tressie Stalker, MD;  Location: [facility] OR;  Service: Neurosurgery;  Laterality: N/A;  . ANTERIOR CERVICAL DECOMP/DISCECTOMY FUSION N/A 04/22/2020   Procedure: Reexploration of anterior cervical wound for epidural hematoma;  Surgeon: Donalee Citrin, MD;  Location: [facility] OR;  Service: Neurosurgery;  Laterality: N/A;  . BACK SURGERY    . INCISION AND DRAINAGE Left 2012   L hand infection  . ROTATOR CUFF REPAIR Right    x 3  . SPINE SURGERY     HPI:  74 year old white male who presented with right lower lobe pneumonia and known history of  Quadriparesis.  He originally had a C3-4 [place] 4-5 anterior cervical disc asked to me with decompression, C3 3-4 and C4-5 interbody arthrodesis with local autograft bone, anterior cervical plating of C3-C5 on 04/05/2020.  He then went to rehab and was discharged.  On 04/21/2020 the patient presented to the emergency room with progressive  bilateral quadriplegia.  Patient went to the operating room and had decompression of a vertebral hematoma.  And been discharged on 11 10/2019 and readmitted on 05/03/2020 for increasing shortness of breath and productive cough.  Chest x-ray demonstrated a new right lower lobe infiltrate with compressive atelectasis, concerning for aspiration pneumonia.  Pt had MBSS during most recent admission on 04/25/2020 with recommendations for puree and thin liquids, advanced to Dys3 prior to discharge   Assessment / Plan / Recommendation Clinical Impression  Pt presents with clinical indictors of pharyngeal dysphagia.  There was immediate cough with all trials of thin liquid by cup and pt required 3 to 4 swallows per bolus.  With puree there was no cough and pt required 2 swallows per bolus.  Pt's niece reports increased coughing at facility.  Pt now has new pneumonia as well.  Recommend MBSS prior to initiation of PO diet, scheduled for later this date.  Pt may have priority medications crushed in puree in the interim. SLP Visit Diagnosis: Dysphagia, oropharyngeal phase (R13.12)    Aspiration Risk  Moderate aspiration risk    Diet Recommendation NPO   Medication Administration: Crushed with puree    Other  Recommendations     Follow up Recommendations  (TBD)      Frequency and Duration  (TBD)          Prognosis   Good     Swallow Study   General HPI: 74 year old white male who presented with right lower lobe pneumonia and known history of  Quadriparesis.  He originally had a C3-4 [place] 4-5 anterior cervical disc asked to me with decompression, C3  3-4 and C4-5 interbody arthrodesis with local autograft bone, anterior cervical plating of C3-C5 on 04/05/2020.  He then went to rehab and was discharged.  On 04/21/2020 the patient presented to the emergency room with progressive bilateral quadriplegia.  Patient went to the operating room and had decompression of a vertebral hematoma.  And been discharged on  11 10/2019 and readmitted on 05/03/2020 for increasing shortness of breath and productive cough.  Chest x-ray demonstrated a new right lower lobe infiltrate with compressive atelectasis, concerning for aspiration pneumonia.  Pt had MBSS during most recent admission on 04/25/2020 with recommendations for puree and thin liquids, advanced to Dys3 prior to discharge Type of Study: Bedside Swallow Evaluation Previous Swallow Assessment: MBSS 11/2 Diet Prior to this Study: NPO Temperature Spikes Noted: No Respiratory Status: Nasal cannula History of Recent Intubation: No Behavior/Cognition: Alert;Cooperative;Pleasant mood Oral Cavity Assessment: Dried secretions Oral Care Completed by SLP: Yes Oral Cavity - Dentition: Missing dentition;Dentures, not available Self-Feeding Abilities: Total assist Patient Positioning: Upright in bed Baseline Vocal Quality: Breathy;Low vocal intensity Volitional Cough: Weak Volitional Swallow: Able to elicit    Oral/Motor/Sensory Function Overall Oral Motor/Sensory Function: Mild impairment Facial ROM: Reduced right;Reduced left Facial Symmetry: Within Functional Limits Lingual ROM: Reduced right;Reduced left Lingual Symmetry: Within Functional Limits Lingual Strength: Reduced Velum: Within Functional Limits Mandible: Within Functional Limits   Ice Chips Ice chips: Not tested   Thin Liquid Thin Liquid: Impaired Pharyngeal  Phase Impairments: Cough - Immediate;Multiple swallows    Nectar Thick Nectar Thick Liquid: Not tested   Honey Thick Honey Thick Liquid: Not tested   Puree Puree: Impaired Pharyngeal Phase Impairments: Multiple swallows   Solid     Solid: Not tested      Kerrie Pleasure, MA, CCC-SLP Acute Rehabilitation Services Office: [phone]  05/04/2020,9:39 AM

## 2020-05-04 NOTE — Plan of Care (Signed)

## 2020-05-04 NOTE — Progress Notes (Signed)
Initial Nutrition Assessment  DOCUMENTATION CODES:   Severe malnutrition in context of chronic illness  INTERVENTION:  - Recommend Palliative Medicine consult for GOC discussions regarding artificial nutrition. If aggressive care is pursued, recommend considering Cortrak placement and initiation of enteral nutrition.  - Vital Cuisine Shake TID with meals, each supplement provides 520 kcal and 22 grams of protein  - Magic Cup TID with meals, each supplement provides 290 kcal and 9 grams of protein  NUTRITION DIAGNOSIS:   Severe Malnutrition related to chronic illness as evidenced by moderate fat depletion, severe muscle depletion, percent weight loss (17.4% weight loss in less than 4 months).  GOAL:   Patient will meet greater than or equal to 90% of their needs  MONITOR:   PO intake, Supplement acceptance, Diet advancement, Labs, Weight trends, Skin  REASON FOR ASSESSMENT:   Consult Assessment of nutrition requirement/status  ASSESSMENT:   74 year old male who presented to the ED from SNF on 11/10 with dyspnea. 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 Pt admitted with sepsis due to UTI, right lower lobe pneumonia.   11/11 - MBS, diet advanced to nectar-thick full liquids  Per CCM note, pt is at high risk for endotracheal intubation.  Attempted to speak with pt at bedside. Pt communicated by nodding but kept eyes closed during encounter. Pt nods his head when asked if he is hungry. He shakes his head "no" when asked if he has had anything to eat. Pt unable to provide diet and weight history at this time.  Spoke with RN who reports pt had some thickened water after MBS and aspirated. Pt asked RN not to give him any more water. RN reports pt has had some applesauce with meds but RN has not yet attempted to feed pt a meal. Noted untouched lunch meal tray at bedside (nectar-thick full liquid) that included  vanilla pudding, thickened tea, and thickened milk. RD to order appropriately thickened oral nutrition supplements to come with meals.  Reviewed weight history in chart. Pt with a 17.8 kg weight loss since 01/09/20. This is a 17.4% weight loss in less than 4 months which is severe and significant for timeframe. Pt meets criteria for severe malnutrition.  Medications reviewed and include: protonix, miralax, IV abx  Labs reviewed. CBGs: 115  UOP: 1000 ml x 24 hours  NUTRITION - FOCUSED PHYSICAL EXAM:    Most Recent Value  Orbital Region Moderate depletion  Upper Arm Region Moderate depletion  Thoracic and Lumbar Region Moderate depletion  Buccal Region Moderate depletion  Temple Region Severe depletion  Clavicle Bone Region Severe depletion  Clavicle and Acromion Bone Region Severe depletion  Scapular Bone Region Unable to assess  Dorsal Hand Moderate depletion  Patellar Region Moderate depletion  Anterior Thigh Region Moderate depletion  Posterior Calf Region Moderate depletion  Edema (RD Assessment) Mild  [BUE]  Hair Reviewed  Eyes Reviewed  Mouth Reviewed  Skin Reviewed  Nails Reviewed       Diet Order:   Diet Order            Diet full liquid Room service appropriate? Yes; Fluid consistency: Nectar Thick  Diet effective now                 EDUCATION NEEDS:   Not appropriate for education at this time  Skin:  Skin Assessment: Skin Integrity Issues: Stage II: bilateral sacrum Incisions: neck  Last BM:  05/03/20  Height:   Ht  Readings from Last 1 Encounters:  05/03/20 6' (1.829 m)    Weight:   Wt Readings from Last 1 Encounters:  05/03/20 84.7 kg    BMI:  Body mass index is 25.33 kg/m.  Estimated Nutritional Needs:   Kcal:  2000-2200  Protein:  110-130 grams  Fluid:  >/= 2.0 L    Earma Reading, MS, RD, LDN Inpatient Clinical Dietitian Please see AMiON for contact information.

## 2020-05-04 NOTE — Progress Notes (Signed)
PROGRESS NOTE    Shannon [name]  KZS:[id number] DOB: 1946-05-19 DOA: 05/03/2020 PCP: [name], MD   Brief Narrative:  HPI: Shannon [name] is a 74 y.o. 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  At the rehab patient sustained a mechanical fall, suffering cervical fracture and cervical epidural hematoma, he was admitted on 10/29 to 04/28/2020 with a working diagnosis of cervical fracture with surgical epidural hematoma, quadriparesis and central cord syndrome.  He underwent revision of his fusion and evacuation of epidural hematoma.  For the last 4 days patient has been not feeling well, with a generalized malaise, cough and congestion.  Per his niece at the bedside he had difficulty swallowing, no recurrent falls.  He does have a pressure wound at his sacrum which has been treated with local wound care. He does have a chronic indwelling Foley catheter.  Today patient was noted to have significant increased work of breathing and tachycardia, no associated pain, no improving or worsening factors.  Due to persistent symptoms he was referred to the hospital for further evaluation.   ED Course:   Patient was ill looking appearing, he had positive leukocytosis and possible pneumonia.  He received 3 L of lactated Ringer's intravenously, antibiotic therapy with ceftriaxone and azithromycin.  He was referred for further hospitalization  Assessment & Plan:   Principal Problem:   Sepsis secondary to UTI [facility]) Active Problems:   Cervical spondylosis with myelopathy and radiculopathy   Quadriparesis (HCC)   Benign essential HTN   Cervical myelopathy (HCC)   Urinary retention   Chronic indwelling Foley catheter   Pressure injury of skin   1. Severe sepsis due to urinary tract infection/aspiration pneumonia,  present on admission, related to chronic indwelling Foley catheter: Patient meets sepsis criteria due to fever, tachycardia, tachypnea and leukocytosis and severe sepsis due to hypoxia and lactic acid of 2.7. Currently requiring about 5 L of oxygen. PCCM on board. Aspiration pneumonia secondary to dysphagia due to recent cervical vertebrae surgery. Seen by SLP. Moderate aspiration risk, remains on n.p.o. further recommendations. SLP to assess every day. Lactic acidosis resolved. Meets criteria for healthcare associated pneumonia, will switch antibiotics to cefepime, Flagyl and azithromycin. Pulmonary toilet. We'll follow urine and blood culture.  2.  Hypernatremia: Resolved.  3.  Quadriplegia due to recent cervical vertebrae fracture.  Continue aspiration and fall precautions. PT/OT/SLP on board.  4.  Stage I-II sacrum decubitus ulcer.  Present on admission, continue local wound care. Consult wound care team.   5. HTN. Hold on amlodipine due to risk of hypotension, continue close blood pressure monitoring.   6. BPH. Continue with tamsulosin. Foley in place.   7. Thrombocytopenia: Likely sepsis induced. No signs of bleeding. Monitor. Hopefully will improve with treatment of sepsis.  DVT prophylaxis: SCDs Start: 05/03/20 1507   Code Status: Full Code  Family Communication: Niece present at bedside. Plan discussed.  Status is: Inpatient  Remains inpatient appropriate because:Hemodynamically unstable   Dispo: The patient is from: SNF              Anticipated d/c is to: SNF              Anticipated d/c date is: > 3 days              Patient currently is not medically stable to d/c.  Estimated body mass index is 25.33 kg/m as calculated from the following:   Height as of this encounter: 6' (1.829 m).   Weight as of this encounter: 84.7 kg.  Pressure Injury 05/03/20 Sacrum Right;Left;Medial Stage 2 -  Partial thickness loss of dermis presenting as a shallow open injury  with a red, pink wound bed without slough. (Active)  05/03/20 2023  Location: Sacrum  Location Orientation: Right;Left;Medial  Staging: Stage 2 -  Partial thickness loss of dermis presenting as a shallow open injury with a red, pink wound bed without slough.  Wound Description (Comments):   Present on Admission: Yes     Nutritional status:               Consultants:   PCCM  Procedures:   None  Antimicrobials:  Anti-infectives (From admission, onward)   Start     Dose/Rate Route Frequency Ordered Stop   05/04/20 1800  cefTRIAXone (ROCEPHIN) 2 g in sodium chloride 0.9 % 100 mL IVPB  Status:  Discontinued        2 g 200 mL/hr over 30 Minutes Intravenous Every 24 hours 05/03/20 1905 05/04/20 0814   05/04/20 1300  azithromycin (ZITHROMAX) 500 mg in sodium chloride 0.9 % 250 mL IVPB        500 mg 250 mL/hr over 60 Minutes Intravenous Every 24 hours 05/03/20 2348     05/04/20 1000  ceFEPIme (MAXIPIME) 2 g in sodium chloride 0.9 % 100 mL IVPB        2 g 200 mL/hr over 30 Minutes Intravenous Every 8 hours 05/04/20 0832     05/04/20 1000  metroNIDAZOLE (FLAGYL) tablet 500 mg        500 mg Oral Every 8 hours 05/04/20 0832     05/03/20 1800  cefTRIAXone (ROCEPHIN) 1 g in sodium chloride 0.9 % 100 mL IVPB  Status:  Discontinued        1 g 200 mL/hr over 30 Minutes Intravenous Every 24 hours 05/03/20 1754 05/03/20 1905   05/03/20 1300  cefTRIAXone (ROCEPHIN) 1 g in sodium chloride 0.9 % 100 mL IVPB  Status:  Discontinued        1 g 200 mL/hr over 30 Minutes Intravenous Every 24 hours 05/03/20 1250 05/03/20 1905   05/03/20 1300  azithromycin (ZITHROMAX) 500 mg in sodium chloride 0.9 % 250 mL IVPB  Status:  Discontinued        500 mg 250 mL/hr over 60 Minutes Intravenous Every 24 hours 05/03/20 1250 05/03/20 1754   05/03/20 1245  levofloxacin (LEVAQUIN) IVPB 750 mg  Status:  Discontinued        750 mg 100 mL/hr over 90 Minutes Intravenous  Once 05/03/20 1243 05/03/20 1250          Subjective: Patient seen and examined. Niece at the bedside. Patient has difficulty talking and he is communicating by nodding. Denies any shortness of breath or any other complaint. Looks comfortable.  Objective: Vitals:   05/03/20 2120 05/04/20 0349 05/04/20 0711 05/04/20 0759  BP: 115/83 108/70  124/74  Pulse: (!) 120 (!) 104  (!) 109  Resp: (!) 35 20  20  Temp: 97.7 F (36.5 C) 98.9 F (37.2 C)  98.7 F (37.1 C)  TempSrc: Oral Axillary    SpO2: 93% 95% 95% 98%  Weight: 84.7 kg     Height: 6' (1.829 m)       Intake/Output Summary (Last 24 hours) at 05/04/2020 1047 Last data filed at 05/04/2020 [phone]  Gross per 24 hour  Intake 508.46 ml  Output 1000 ml  Net -491.54 ml   Filed Weights   05/03/20 2120  Weight: 84.7 kg    Examination:  General exam: Appears calm and comfortable  Respiratory system: Diminished breath sounds. Respiratory effort normal. Cardiovascular system: S1 & S2 heard, RRR. No JVD, murmurs, rubs, gallops or clicks. No pedal edema. Gastrointestinal system: Abdomen is nondistended, soft and minimal generalized tenderness. No organomegaly or masses felt. Normal bowel sounds heard. Central nervous system: Alert and oriented. No focal neurological deficits. Extremities: Symmetric 5 x 5 power. Skin: No rashes, lesions or ulcers  Data Reviewed: I have personally reviewed following labs and imaging studies  CBC: Recent Labs  Lab 05/03/20 1243 05/03/20 1321 05/04/20 0001  WBC 19.4*  --  20.5*  NEUTROABS 16.8*  --   --   HGB 14.8 22.1* 12.9*  HCT 47.4 65.0* 40.5  MCV 92.2  --  89.0  PLT 133*  --  117*   Basic Metabolic Panel: Recent Labs  Lab 05/03/20 1243 05/03/20 1321 05/04/20 0001  NA 147* 150* 145  K 4.2 4.3 3.8  CL 111  --  113*  CO2 27  --  25  GLUCOSE 158*  --  167*  BUN 20  --  19  CREATININE 1.15  --  0.82  CALCIUM 8.5*  --  8.0*   GFR: Estimated Creatinine Clearance: 86.7 mL/min (by C-G formula based on SCr of 0.82  mg/dL). Liver Function Tests: Recent Labs  Lab 05/03/20 1243  AST 81*  ALT 157*  ALKPHOS 90  BILITOT 1.7*  PROT 6.2*  ALBUMIN 2.2*   No results for input(s): LIPASE, AMYLASE in the last 168 hours. No results for input(s): AMMONIA in the last 168 hours. Coagulation Profile: Recent Labs  Lab 05/03/20 1243  INR 1.7*   Cardiac Enzymes: No results for input(s): CKTOTAL, CKMB, CKMBINDEX, TROPONINI in the last 168 hours. BNP (last 3 results) No results for input(s): PROBNP in the last 8760 hours. HbA1C: No results for input(s): HGBA1C in the last 72 hours. CBG: Recent Labs  Lab 05/04/20 0755  GLUCAP 115*   Lipid Profile: No results for input(s): CHOL, HDL, LDLCALC, TRIG, CHOLHDL, LDLDIRECT in the last 72 hours. Thyroid Function Tests: No results for input(s): TSH, T4TOTAL, FREET4, T3FREE, THYROIDAB in the last 72 hours. Anemia Panel: No results for input(s): VITAMINB12, FOLATE, FERRITIN, TIBC, IRON, RETICCTPCT in the last 72 hours. Sepsis Labs: Recent Labs  Lab 05/03/20 1301 05/03/20 1538 05/04/20 0821  LATICACIDVEN 2.7* 2.5* 1.4    Recent Results (from the past 240 hour(s))  SARS Coronavirus 2 by RT PCR (hospital order, performed in [facility] hospital lab) Nasopharyngeal Nasopharyngeal Swab     Status: None   Collection Time: 04/27/20  2:35 PM   Specimen: Nasopharyngeal Swab  Result Value Ref Range Status   SARS Coronavirus 2 NEGATIVE NEGATIVE Final    Comment: (NOTE) SARS-CoV-2 target nucleic acids are NOT DETECTED.  The SARS-CoV-2 RNA is generally detectable in upper and lower respiratory specimens during the acute phase of infection. The lowest concentration of SARS-CoV-2 viral copies this assay can detect is 250 copies / mL. A negative result does not preclude SARS-CoV-2 infection and should not be used as the sole basis for treatment or other patient management decisions.  A negative result may occur with improper specimen collection / handling,  submission of specimen other than nasopharyngeal swab, presence of viral mutation(s) within the areas targeted by this  assay, and inadequate number of viral copies (<250 copies / mL). A negative result must be combined with clinical observations, patient history, and epidemiological information.  Fact Sheet for Patients:   BoilerBrush.com.cy  Fact Sheet for Healthcare Providers: https://pope.com/  This test is not yet approved or  cleared by the [place] FDA and has been authorized for detection and/or diagnosis of SARS-CoV-2 by FDA under an Emergency Use Authorization (EUA).  This EUA will remain in effect (meaning this test can be used) for the duration of the COVID-19 declaration under Section 564(b)(1) of the Act, 21 U.S.C. section 360bbb-3(b)(1), unless the authorization is terminated or revoked sooner.  Performed at [facility], [address], [place], [place] [address]   Blood Culture (routine x 2)     Status: None (Preliminary result)   Collection Time: 05/03/20  1:00 PM   Specimen: BLOOD  Result Value Ref Range Status   Specimen Description BLOOD SITE NOT SPECIFIED  Final   Special Requests   Final    BOTTLES DRAWN AEROBIC AND ANAEROBIC Blood Culture results may not be optimal due to an inadequate volume of blood received in culture bottles   Culture   Final    NO GROWTH < 24 HOURS Performed at [facility], [address], [place], [place] [address]    Report Status PENDING  Incomplete  Blood Culture (routine x 2)     Status: None (Preliminary result)   Collection Time: 05/03/20  1:16 PM   Specimen: BLOOD  Result Value Ref Range Status   Specimen Description BLOOD SITE NOT SPECIFIED  Final   Special Requests   Final    BOTTLES DRAWN AEROBIC AND ANAEROBIC Blood Culture results may not be optimal due to an inadequate volume of blood received in culture bottles   Culture   Final    NO GROWTH < 24  HOURS Performed at [facility], [address] [place], [place] [address]    Report Status PENDING  Incomplete  Respiratory Panel by RT PCR (Flu A&B, Covid) - Nasopharyngeal Swab     Status: None   Collection Time: 05/03/20 11:38 PM   Specimen: Nasopharyngeal Swab  Result Value Ref Range Status   SARS Coronavirus 2 by RT PCR NEGATIVE NEGATIVE Final    Comment: (NOTE) SARS-CoV-2 target nucleic acids are NOT DETECTED.  The SARS-CoV-2 RNA is generally detectable in upper respiratoy specimens during the acute phase of infection. The lowest concentration of SARS-CoV-2 viral copies this assay can detect is 131 copies/mL. A negative result does not preclude SARS-Cov-2 infection and should not be used as the sole basis for treatment or other patient management decisions. A negative result may occur with  improper specimen collection/handling, submission of specimen other than nasopharyngeal swab, presence of viral mutation(s) within the areas targeted by this assay, and inadequate number of viral copies (<131 copies/mL). A negative result must be combined with clinical observations, patient history, and epidemiological information. The expected result is Negative.  Fact Sheet for Patients:  https://www.moore.com/  Fact Sheet for Healthcare Providers:  https://www.young.biz/  This test is no t yet approved or cleared by the [place] FDA and  has been authorized for detection and/or diagnosis of SARS-CoV-2 by FDA under an Emergency Use Authorization (EUA). This EUA will remain  in effect (meaning this test can be used) for the duration of the COVID-19 declaration under Section 564(b)(1) of the Act, 21 U.S.C. section 360bbb-3(b)(1), unless the authorization is terminated  or revoked sooner.     Influenza A by PCR NEGATIVE NEGATIVE Final   Influenza B by PCR NEGATIVE NEGATIVE Final    Comment: (NOTE) The Xpert Xpress SARS-CoV-2/FLU/RSV  assay is intended as an aid in  the diagnosis of influenza from Nasopharyngeal swab specimens and  should not be used as a sole basis for treatment. Nasal washings and  aspirates are unacceptable for Xpert Xpress SARS-CoV-2/FLU/RSV  testing.  Fact Sheet for Patients: https://www.moore.com/  Fact Sheet for Healthcare Providers: https://www.young.biz/  This test is not yet approved or cleared by the [place] FDA and  has been authorized for detection and/or diagnosis of SARS-CoV-2 by  FDA under an Emergency Use Authorization (EUA). This EUA will remain  in effect (meaning this test can be used) for the duration of the  Covid-19 declaration under Section 564(b)(1) of the Act, 21  U.S.C. section 360bbb-3(b)(1), unless the authorization is  terminated or revoked. Performed at [facility], [address], [place], [place] [address]       Radiology Studies: CT Head Wo Contrast  Result Date: 05/03/2020 CLINICAL DATA:  Facial trauma after multiple falls. EXAM: CT HEAD WITHOUT CONTRAST CT CERVICAL SPINE WITHOUT CONTRAST TECHNIQUE: Multidetector CT imaging of the head and cervical spine was performed following the standard protocol without intravenous contrast. Multiplanar CT image reconstructions of the cervical spine were also generated. COMPARISON:  April 21, 2020. FINDINGS: CT HEAD FINDINGS Brain: Mild chronic ischemic white matter disease is noted. No mass effect or midline shift is noted. Ventricular size is within normal limits. There is no evidence of mass lesion, hemorrhage or acute infarction. Vascular: No hyperdense vessel or unexpected calcification. Skull: Normal. Negative for fracture or focal lesion. Sinuses/Orbits: No acute finding. Other: None. CT CERVICAL SPINE FINDINGS Alignment: Normal. Skull base and vertebrae: No acute fracture is noted. Postsurgical changes are seen involving the C3, C4 and C5 vertebral bodies. Soft tissues  and spinal canal: No prevertebral fluid or swelling. No visible canal hematoma. Disc levels: Status post surgical anterior fusion of C3-4 and C4-5. There is fusion of the C5-6 and C6-7 disc spaces secondary to degenerative change. Upper chest: Negative. Other: None. IMPRESSION: 1. Mild chronic ischemic white matter disease. No acute intracranial abnormality seen. 2. Postsurgical and degenerative changes as described above. No acute abnormality seen in the cervical spine. Electronically Signed   By: Lupita Raider M.D.   On: 05/03/2020 14:41   CT Chest Wo Contrast  Result Date: 05/03/2020 CLINICAL DATA:  Short of breath, fever, sepsis, multiple falls EXAM: CT CHEST WITHOUT CONTRAST TECHNIQUE: Multidetector CT imaging of the chest was performed following the standard protocol without IV contrast. COMPARISON:  05/03/2020 FINDINGS: Cardiovascular: Unenhanced imaging of the heart and great vessels demonstrates trace pericardial fluid. The heart is not enlarged. Aneurysmal dilatation of the ascending thoracic aorta measuring up to 4.4 cm. Evaluation of the vascular lumen is limited without intravenous contrast. Mediastinum/Nodes: No enlarged mediastinal or axillary lymph nodes. Thyroid gland, trachea, and esophagus demonstrate no significant findings. Lungs/Pleura: There is diffuse bronchial wall thickening. Complete opacification of the bronchus intermedius and right middle and right lower lobe bronchi are seen, along with partial opacification of the left lower lobe bronchus. There is patchy airspace disease within the right upper and left lower lobes. Complete consolidation of the right middle and right lower lobes with associated volume loss consistent with atelectasis. No effusion or pneumothorax. Upper Abdomen: Calcified gallstone is identified. The remainder of the upper abdomen is  unremarkable. Musculoskeletal: Severe left shoulder osteoarthritis. No acute or destructive bony lesions. Reconstructed images  demonstrate no additional findings. IMPRESSION: 1. Diffuse bronchial wall thickening, with patchy airspace disease of the right upper and left lower lobes, consistent with bronchopneumonia. 2. Opacification of the right middle, right lower, and left lower lobe bronchi, with complete collapse of the right middle and right lower lobes. This may be related to mucous plugging or underlying infection. 3. Trace pericardial fluid. 4. Cholelithiasis without cholecystitis. 5. 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 Recommend annual imaging followup by CTA or MRA. This recommendation follows 2010 ACCF/AHA/AATS/ACR/ASA/SCA/SCAI/SIR/STS/SVM Guidelines for the Diagnosis and Management of Patients with Thoracic Aortic Disease. Circulation. 2010; 121: Q469-G295: E266-e369. Aortic aneurysm NOS (ICD10-I71.9) Electronically Signed   By: Sharlet SalinaMichael  Brown M.D.   On: 05/03/2020 16:31   CT Cervical Spine Wo Contrast  Result Date: 05/03/2020 CLINICAL DATA:  Facial trauma after multiple falls. EXAM: CT HEAD WITHOUT CONTRAST CT CERVICAL SPINE WITHOUT CONTRAST TECHNIQUE: Multidetector CT imaging of the head and cervical spine was performed following the standard protocol without intravenous contrast. Multiplanar CT image reconstructions of the cervical spine were also generated. COMPARISON:  April 21, 2020. FINDINGS: CT HEAD FINDINGS Brain: Mild chronic ischemic white matter disease is noted. No mass effect or midline shift is noted. Ventricular size is within normal limits. There is no evidence of mass lesion, hemorrhage or acute infarction. Vascular: No hyperdense vessel or unexpected calcification. Skull: Normal. Negative for fracture or focal lesion. Sinuses/Orbits: No acute finding. Other: None. CT CERVICAL SPINE FINDINGS Alignment: Normal. Skull base and vertebrae: No acute fracture is noted. Postsurgical changes are seen involving the C3, C4 and C5 vertebral bodies. Soft tissues and spinal canal: No  prevertebral fluid or swelling. No visible canal hematoma. Disc levels: Status post surgical anterior fusion of C3-4 and C4-5. There is fusion of the C5-6 and C6-7 disc spaces secondary to degenerative change. Upper chest: Negative. Other: None. IMPRESSION: 1. Mild chronic ischemic white matter disease. No acute intracranial abnormality seen. 2. Postsurgical and degenerative changes as described above. No acute abnormality seen in the cervical spine. Electronically Signed   By: Lupita RaiderJames  Green Jr M.D.   On: 05/03/2020 14:41   DG CHEST PORT 1 VIEW  Result Date: 05/04/2020 CLINICAL DATA:  Respiratory distress EXAM: PORTABLE CHEST 1 VIEW COMPARISON:  05/03/2020 FINDINGS: Unchanged right basilar consolidation. Cardiomediastinal contours and pleural spaces are normal. Left lung is clear. IMPRESSION: Unchanged right basilar consolidation. Electronically Signed   By: Deatra RobinsonKevin  Herman M.D.   On: 05/04/2020 00:07   DG Chest Port 1 View  Result Date: 05/03/2020 CLINICAL DATA:  Questionable sepsis. EXAM: PORTABLE CHEST 1 VIEW COMPARISON:  01/09/2020. FINDINGS: The heart size and mediastinal contours are within normal limits. Low lung volumes. Left basilar opacities. No visible pleural effusions or pneumothorax. Elevated right hemidiaphragm. No acute osseous abnormality. Bilateral shoulder degenerative change. Partially imaged cervical ACDF. IMPRESSION: Low lung volumes with left basilar opacities, which may represent atelectasis, aspiration and/or pneumonia. Dedicated PA and lateral radiographs could better characterize if clinically indicated. Electronically Signed   By: Feliberto HartsFrederick S Jones MD   On: 05/03/2020 13:12    Scheduled Meds: . Chlorhexidine Gluconate Cloth  6 each Topical Daily  . metroNIDAZOLE  500 mg Oral Q8H  . pantoprazole  40 mg Oral QHS  . polyethylene glycol  17 g Oral Daily  . tamsulosin  0.4 mg Oral Daily   Continuous Infusions: . azithromycin    . ceFEPime (MAXIPIME) IV  LOS: 1 day    Time spent: 37 minutes   Hughie Closs, MD [facility]  05/04/2020, 10:47 AM   To contact the attending provider between 7A-7P or the covering provider during after hours 7P-7A, please log into the web site [email or web address].

## 2020-05-04 NOTE — Progress Notes (Addendum)
Pt from [place] per notes.  SNF recommended by PT, CSW LM with [facility] to confirm pt can return. Daleen Squibb, MSW, LCSW 11/11/20212:43 PM   [facility] confirms pt can return when ready for DC. Daleen Squibb, MSW, LCSW 11/11/20213:05 PM

## 2020-05-04 NOTE — Progress Notes (Signed)
RT called to place patient on CPAP.

## 2020-05-04 NOTE — Consult Note (Signed)
NAME:  Shannon [name], MRN:  [id number], DOB:  [birth year], LOS: 1 ADMISSION DATE:  05/03/2020, CONSULTATION DATE: 05/04/2020 REFERRING MD: Dr Loney Loh, CHIEF COMPLAINT:  SOB Called for consult at 03:22 Pt evaluated at 03:28  Brief History   74 year old white male with new right lower lobe pneumonia  History of present illness   74 year old white male who presented with right lower lobe pneumonia and known history of  Quadriparesis.  He originally had a C3-4 Aurora 4-5 anterior cervical disc asked to me with decompression, C3 3-4 and C4-5 interbody arthrodesis with local autograft bone, anterior cervical plating of C3-C5 on 04/05/2020.  He then went to rehab and was discharged.  On 04/21/2020 the patient presented to the emergency room with progressive bilateral quadriplegia.  Patient went to the operating room and had decompression of a vertebral hematoma.  And been discharged on 11 10/2019 and readmitted on 05/03/2020 for increasing shortness of breath and productive cough.  Chest x-ray demonstrated a new right lower lobe infiltrate with compressive atelectasis.  Critical care medicine was consulted because of increasing oxygen demand and concern for respiratory pattern.  Currently the patient will nod appropriately to questions has a better than expected cough on demand.  Does not currently appear to be in any distress.  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 rate (!) 35, height 6' (1.829 m), weight 84.7 kg, SpO2 93 %.         Intake/Output Summary (Last 24 hours) at 05/04/2020 0345 Last data filed at 05/04/2020 0043 Gross per 24 hour  Intake 508.46 ml  Output 1000 ml  Net -491.54 ml   Filed Weights   05/03/20 2120  Weight: 84.7 kg    Examination: General: No acute distress HENT: Atraumatic/normocephalic cervical spine collar in place mucous membranes are moist Lungs: Generally clear with scattered rhonchi at the right base.  No significant wheezing or rales. Cardiovascular: Regular rate no murmur rub or gallop appreciated Abdomen: Soft nondistended positive bowel sounds Extremities: Distal pulse intact x4. Neuro: Sleeping but quickly arouses to voice nods appropriately to questions    Assessment & Plan:  Right lower lobe HCAP Right lower lobe compressive atelectasis UTI Recent development of quadriplegia Recent cervical spine surgery as noted in HPI  Plan: Currently the patient appears to be comfortable.  He is on 5 L nasal cannula. He has a strong cough but may benefit from addition of quad cough exercises. At this time the patient does not require transfer to the intensive care unit but will continue to monitor Agree that BiPAP may not be the best device for this patient Antibiotics to be initiated Cultures are pending. Discussed the case with patient's nurse at bedside. Labs   CBC: Recent Labs  Lab 05/03/20 1243 05/03/20 1321 05/04/20 0001  WBC 19.4*  --  20.5*  NEUTROABS 16.8*  --   --   HGB 14.8 22.1* 12.9*  HCT 47.4 65.0* 40.5  MCV 92.2  --  89.0  PLT 133*  --  117*    Basic Metabolic Panel: Recent Labs  Lab 05/03/20 1243 05/03/20  1321 05/04/20 0001  NA 147* 150* 145  K 4.2 4.3 3.8  CL 111  --  113*  CO2 27  --  25  GLUCOSE 158*  --  167*  BUN 20  --  19  CREATININE 1.15  --  0.82  CALCIUM 8.5*  --  8.0*   GFR: Estimated Creatinine Clearance: 86.7 mL/min (by C-G formula based on SCr of 0.82 mg/dL). Recent Labs  Lab 05/03/20 1243 05/03/20 1301  05/03/20 1538 05/04/20 0001  WBC 19.4*  --   --  20.5*  LATICACIDVEN  --  2.7* 2.5*  --     Liver Function Tests: Recent Labs  Lab 05/03/20 1243  AST 81*  ALT 157*  ALKPHOS 90  BILITOT 1.7*  PROT 6.2*  ALBUMIN 2.2*   No results for input(s): LIPASE, AMYLASE in the last 168 hours. No results for input(s): AMMONIA in the last 168 hours.  ABG    Component Value Date/Time   PHART 7.504 (H) 05/03/2020 2303   PCO2ART 34.2 05/03/2020 2303   PO2ART 66.5 (L) 05/03/2020 2303   HCO3 26.9 05/03/2020 2303   TCO2 33 (H) 05/03/2020 1321   O2SAT 94.3 05/03/2020 2303     Coagulation Profile: Recent Labs  Lab 05/03/20 1243  INR 1.7*    Cardiac Enzymes: No results for input(s): CKTOTAL, CKMB, CKMBINDEX, TROPONINI in the last 168 hours.  HbA1C: No results found for: HGBA1C  CBG: No results for input(s): GLUCAP in the last 168 hours.  Review of Systems:   Limited by neurologic status  Past Medical History  He,  has a past medical history of Asthma, Chiari I malformation (HCC), Hay fever, Hypertension, and Osteoarthritis of both hands.   Surgical History    Past Surgical History:  Procedure Laterality Date  . ANTERIOR CERVICAL DECOMP/DISCECTOMY FUSION N/A 04/05/2020   Procedure: ANTERIOR CERVICAL DECOMPRESSION/DISCECTOMY FUSION, INTERBODY PROSTHESIS, PLATE/SCREWS CERVICAL THREE-CERVICAL FOUR, CERVICAL FOUR- CERVICAL FIVE;  Surgeon: Tressie Stalker, MD;  Location: [facility] OR;  Service: Neurosurgery;  Laterality: N/A;  . ANTERIOR CERVICAL DECOMP/DISCECTOMY FUSION N/A 04/22/2020   Procedure: Reexploration of anterior cervical wound for epidural hematoma;  Surgeon: Donalee Citrin, MD;  Location: [facility] OR;  Service: Neurosurgery;  Laterality: N/A;  . BACK SURGERY    . INCISION AND DRAINAGE Left 2012   L hand infection  . ROTATOR CUFF REPAIR Right    x 3  . SPINE SURGERY       Social History   reports that he has quit smoking. He has never used smokeless tobacco. He reports previous  alcohol use. He reports that he does not use drugs.   Family History   His family history includes Alcohol abuse in his father; Cancer in his father; Diabetes in his father; Heart attack in his mother; Heart disease in his mother; High blood pressure in his father and mother.   Allergies Allergies  Allergen Reactions  . Iodine Swelling  . Penicillins Swelling    Facial swelling, itchy throat     Home Medications  Prior to Admission medications   Medication Sig Start Date End Date Taking? Authorizing Provider  acetaminophen (TYLENOL) 500 MG tablet Take 1,000 mg by mouth in the morning, at noon, and at bedtime.   Yes [provider]  amLODipine (NORVASC) 10 MG tablet Take 1 tablet (10 mg total) by mouth daily. 02/16/20  Yes McGowen, Maryjean Morn, MD  apixaban (ELIQUIS) 2.5 MG TABS tablet Take 1 tablet (2.5 mg total) by mouth 2 (two) times  daily. 04/19/20  Yes Love, [name], PA-C  docusate sodium (COLACE) 100 MG capsule Take 1 capsule (100 mg total) by mouth 2 (two) times daily. 04/19/20  Yes Love, [name], PA-C  oxyCODONE (OXY IR/ROXICODONE) 5 MG immediate release tablet Take 5 mg by mouth daily as needed for severe pain.   Yes [provider]  pantoprazole (PROTONIX) 40 MG tablet Take 1 tablet (40 mg total) by mouth at bedtime. 04/19/20  Yes Love, [name], PA-C  polyethylene glycol (MIRALAX / GLYCOLAX) 17 g packet Take 17 g by mouth daily.   Yes [provider]  tamsulosin (FLOMAX) 0.4 MG CAPS capsule Take 1 capsule (0.4 mg total) by mouth daily. 04/28/20  Yes Tressie Stalker, MD     Critical care time: 

## 2020-05-04 NOTE — Progress Notes (Signed)
Provider at bedside to assess patient. Chart reviewed. Provider consulted with critical care. Ordered ABG, COVID-19 test panel, discontinued IV fluids, repeat chest x-ray, scheduled Zithromax IVPB, discontinued diet orders and consulted dietician, ordered serum BNP. Aspiration, airborne, and contact precautions initiated.

## 2020-05-04 NOTE — Progress Notes (Addendum)
NAME:  Shannon [name], MRN:  [id number], DOB:  [birth year], LOS: 1 ADMISSION DATE:  05/03/2020, CONSULTATION DATE: 05/04/2020 REFERRING MD: Dr Loney Loh, CHIEF COMPLAINT:  SOB Called for consult at 03:22 Pt evaluated at 03:28  Brief History   74 year old white male with new right lower lobe pneumonia  History of present illness   74 year old white male who presented with right lower lobe pneumonia and known history of  Quadriparesis.  He originally had a C3-4 Roe 4-5 anterior cervical disc asked to me with decompression, C3 3-4 and C4-5 interbody arthrodesis with local autograft bone, anterior cervical plating of C3-C5 on 04/05/2020.  He then went to rehab and was discharged.  On 04/21/2020 the patient presented to the emergency room with progressive bilateral quadriplegia.  Patient went to the operating room and had decompression of a vertebral hematoma.  And been discharged on 11 10/2019 and readmitted on 05/03/2020 for increasing shortness of breath and productive cough.  Chest x-ray demonstrated a new right lower lobe infiltrate with compressive atelectasis.  Critical care medicine was consulted because of increasing oxygen demand and concern for respiratory pattern.  Currently the patient will nod appropriately to questions has a better than expected cough on demand.  Does not currently appear to be in any distress.  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 rate 20, height 6' (1.829 m), weight 84.7 kg, SpO2 98 %.        Intake/Output Summary (Last  24 hours) at 05/04/2020 0952 Last data filed at 05/04/2020 0043 Gross per 24 hour  Intake 508.46 ml  Output 1000 ml  Net -491.54 ml   Filed Weights   05/03/20 2120  Weight: 84.7 kg    Examination: General: Elderly male who is able to communicate in cough frequently HEENT: Aspen cervical collar is in place.  Cervical surgery sites are intact Neuro: Noted to be quadriparetic weak movements of the arms and lower extremities.  Adequate cough CV: Sinus tach PULM: Decreased air movement use of accessory muscles Currently on supplemental oxygen  GI: soft, bsx4 active  GU: Indwelling Foley catheter Extremities: warm/dry, 2+ edema  Skin: no rashes or lesions     Assessment & Plan:   Respiratory distress in the setting of recent surgical surgeries x2 with a history of fall with epidural hematoma now with quadriparesis and suspected pneumonia with questionable aspiration component  O2 supplementation Pulmonary toilet Day 1 Zithromax Maxipime and Flagyl Continue careful monitoring in stepdown unit Would not use BiPAP for fear of worsening his aspiration  Quadriparesis in the setting of surgical interventions x2 per   Sacral decubitus  Per primary         Labs   CBC: Recent Labs  Lab 05/03/20 1243 05/03/20 1321 05/04/20 0001  WBC 19.4*  --  20.5*  NEUTROABS 16.8*  --   --   HGB 14.8 22.1* 12.9*  HCT 47.4 65.0* 40.5  MCV 92.2  --  89.0  PLT 133*  --  117*    Basic Metabolic Panel: Recent Labs  Lab 05/03/20 1243 05/03/20 1321 05/04/20 0001  NA 147* 150* 145  K 4.2 4.3 3.8  CL 111  --  113*  CO2 27  --  25  GLUCOSE 158*  --  167*  BUN 20  --  19  CREATININE 1.15  --  0.82  CALCIUM 8.5*  --  8.0*   GFR: Estimated Creatinine Clearance: 86.7 mL/min (by C-G formula based on SCr of 0.82 mg/dL). Recent Labs  Lab 05/03/20 1243 05/03/20 1301 05/03/20 1538 05/04/20 0001 05/04/20 0821  WBC 19.4*  --   --  20.5*  --   LATICACIDVEN  --  2.7* 2.5*  --   1.4    Liver Function Tests: Recent Labs  Lab 05/03/20 1243  AST 81*  ALT 157*  ALKPHOS 90  BILITOT 1.7*  PROT 6.2*  ALBUMIN 2.2*   No results for input(s): LIPASE, AMYLASE in the last 168 hours. No results for input(s): AMMONIA in the last 168 hours.  ABG    Component Value Date/Time   PHART 7.504 (H) 05/03/2020 2303   PCO2ART 34.2 05/03/2020 2303   PO2ART 66.5 (L) 05/03/2020 2303   HCO3 26.9 05/03/2020 2303   TCO2 33 (H) 05/03/2020 1321   O2SAT 94.3 05/03/2020 2303     Coagulation Profile: Recent Labs  Lab 05/03/20 1243  INR 1.7*    Cardiac Enzymes: No results for input(s): CKTOTAL, CKMB, CKMBINDEX, TROPONINI in the last 168 hours.  HbA1C: No results found for: HGBA1C  CBG: Recent Labs  Lab 05/04/20 0755  GLUCAP 115Brett Canales Alaura Schippers ACNP Acute Care Nurse Practitioner [facility] Pulmonary/Critical Care Please consult Amion 05/04/2020, 9:52 AM

## 2020-05-04 NOTE — Progress Notes (Signed)
RT note. Pt. Not being placed on CPAP at this time in regards to safety issues. Concerns for  if pt. Needs to take CPAP mask off pt. Is not capable at this time. RT will continue to monitor.

## 2020-05-04 NOTE — Progress Notes (Addendum)
   05/04/20 0259  Provider Notification  Provider Name/Title [place]  Date Provider Notified 05/04/20  Time Provider Notified 0300  Notification Type Page  Notification Reason Other (Comment)  RT states that they are unable to place pt on CPAP due to safety concerns. Patient is not able to take mask off.  0305: Provider recieved page and called back. Updated on patient status. Patient still tachypneic with accessory muscle use. Patient drowsy, but responds to voice and answering by nodding or simple verbal responses. Provider to consult with critical care team.   [phone]: Provider call back to inform that critical care team en route to assess patient at bedside.

## 2020-05-05 ENCOUNTER — Encounter (HOSPITAL_COMMUNITY)
Admission: EM | Disposition: A | Discharge status: Skilled Nursing Facility | Payer: Self-pay | Source: Skilled Nursing Facility | Attending: Family Medicine

## 2020-05-05 ENCOUNTER — Encounter (HOSPITAL_COMMUNITY): Payer: Self-pay | Admitting: Internal Medicine

## 2020-05-05 ENCOUNTER — Inpatient Hospital Stay (HOSPITAL_COMMUNITY): Payer: Medicare Other

## 2020-05-05 DIAGNOSIS — A419 Sepsis, unspecified organism: Secondary | ICD-10-CM

## 2020-05-05 DIAGNOSIS — E43 Unspecified severe protein-calorie malnutrition: Secondary | ICD-10-CM | POA: Insufficient documentation

## 2020-05-05 DIAGNOSIS — N390 Urinary tract infection, site not specified: Secondary | ICD-10-CM

## 2020-05-05 LAB — COMPREHENSIVE METABOLIC PANEL
ALT: 89 U/L — ABNORMAL HIGH (ref 0–44)
AST: 50 U/L — ABNORMAL HIGH (ref 15–41)
Albumin: 1.6 g/dL — ABNORMAL LOW (ref 3.5–5.0)
Alkaline Phosphatase: 81 U/L (ref 38–126)
Anion gap: 7 (ref 5–15)
BUN: 19 mg/dL (ref 8–23)
CO2: 26 mmol/L (ref 22–32)
Calcium: 8.2 mg/dL — ABNORMAL LOW (ref 8.9–10.3)
Chloride: 117 mmol/L — ABNORMAL HIGH (ref 98–111)
Creatinine, Ser: 0.96 mg/dL (ref 0.61–1.24)
GFR, Estimated: >60 mL/min (ref >60)
Glucose, Bld: 127 mg/dL — ABNORMAL HIGH (ref 70–99)
Potassium: 3.7 mmol/L (ref 3.5–5.1)
Sodium: 150 mmol/L — ABNORMAL HIGH (ref 135–145)
Total Bilirubin: 1 mg/dL (ref 0.3–1.2)
Total Protein: 5.3 g/dL — ABNORMAL LOW (ref 6.5–8.1)

## 2020-05-05 LAB — CBC WITH DIFFERENTIAL/PLATELET
Abs Immature Granulocytes: 0.16 10*3/uL — ABNORMAL HIGH (ref 0.00–0.07)
Basophils Absolute: 0 10*3/uL (ref 0.0–0.1)
Basophils Relative: 0 %
Eosinophils Absolute: 0 10*3/uL (ref 0.0–0.5)
Eosinophils Relative: 0 %
HCT: 39.9 % (ref 39.0–52.0)
Hemoglobin: 12.6 g/dL — ABNORMAL LOW (ref 13.0–17.0)
Immature Granulocytes: 1 %
Lymphocytes Relative: 5 %
Lymphs Abs: 0.9 10*3/uL (ref 0.7–4.0)
MCH: 28.8 pg (ref 26.0–34.0)
MCHC: 31.6 g/dL (ref 30.0–36.0)
MCV: 91.3 fL (ref 80.0–100.0)
Monocytes Absolute: 0.7 10*3/uL (ref 0.1–1.0)
Monocytes Relative: 4 %
Neutro Abs: 14.9 10*3/uL — ABNORMAL HIGH (ref 1.7–7.7)
Neutrophils Relative %: 90 %
Platelets: 127 10*3/uL — ABNORMAL LOW (ref 150–400)
RBC: 4.37 MIL/uL (ref 4.22–5.81)
RDW: 14.1 % (ref 11.5–15.5)
WBC: 16.6 10*3/uL — ABNORMAL HIGH (ref 4.0–10.5)
nRBC: 0 % (ref 0.0–0.2)

## 2020-05-05 LAB — MAGNESIUM: Magnesium: 2.1 mg/dL (ref 1.7–2.4)

## 2020-05-05 LAB — LACTIC ACID, PLASMA: Lactic Acid, Venous: 1.8 mmol/L (ref 0.5–1.9)

## 2020-05-05 IMAGING — DX DG CHEST 1V PORT
1 series · 1 of 1 positions shown · non-contrast
Comparison: [DATE]

CLINICAL DATA: Respiratory distress.

EXAM:
PORTABLE CHEST 1 VIEW

[chest ap]
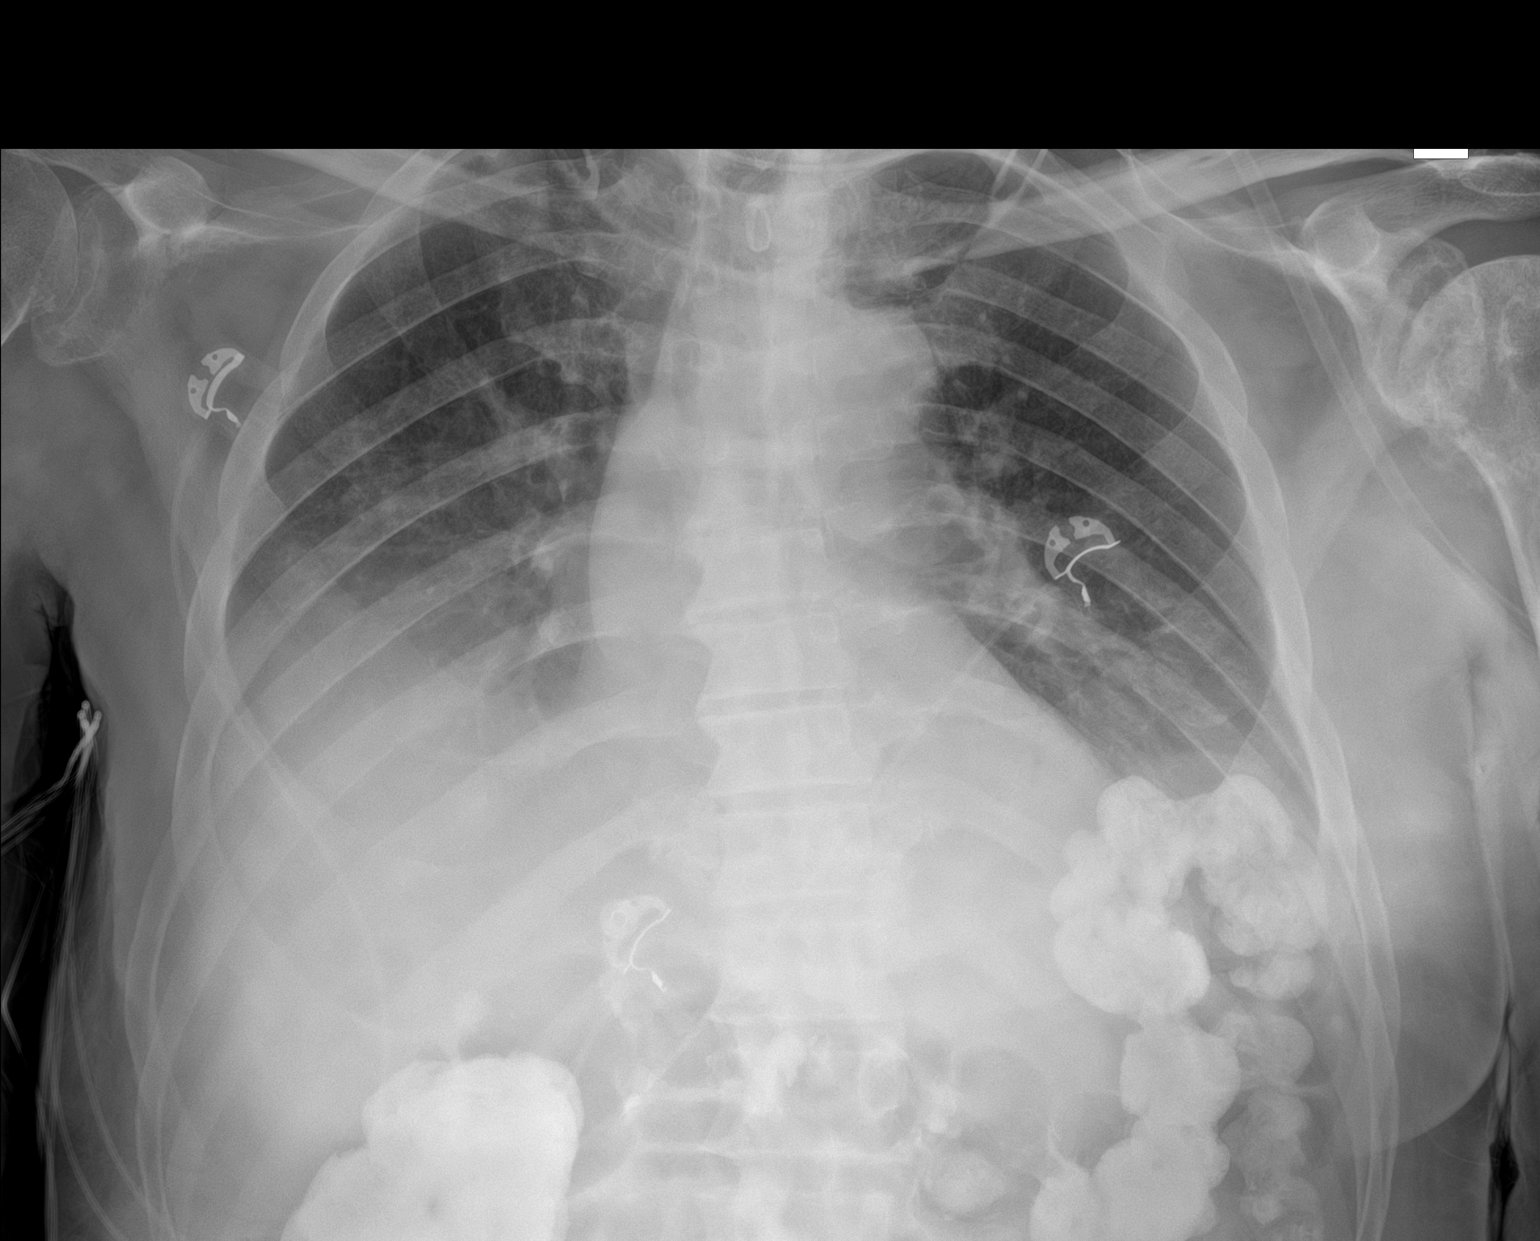

[1 of 1 positions shown; findings below may reference images not displayed]

FINDINGS: The cardiac silhouette, mediastinal and hilar contours are within
normal limits and stable.

Moderate-sized right pleural effusion with overlying atelectasis or
infiltrate.

Patchy left basilar infiltrate.
IMPRESSION: Moderate-sized right pleural effusion with overlying atelectasis or
infiltrate.

Patchy left basilar infiltrate.

## 2020-05-05 SURGERY — CANCELLED PROCEDURE

## 2020-05-05 MED ORDER — FUROSEMIDE 10 MG/ML IJ SOLN
40.0000 mg | Freq: Four times a day (QID) | INTRAMUSCULAR | Status: AC
  Administered 2020-05-05 (×2): 40 mg via INTRAVENOUS
  Filled 2020-05-05 (×2): qty 4

## 2020-05-05 MED ORDER — LIDOCAINE HCL (PF) 1 % IJ SOLN
INTRAMUSCULAR | Status: AC
  Filled 2020-05-05: qty 30

## 2020-05-05 NOTE — Care Management Important Message (Signed)
Important Message  Patient Details  Name: Shannon [name] MRN: [id number] Date of Birth: Dec 23, 1945   Medicare Important Message Given:  Yes     Dorena Bodo 05/05/2020, 3:12 PM

## 2020-05-05 NOTE — Progress Notes (Addendum)
PROGRESS NOTE    Shannon [name]  ZOX:[id number] DOB: 05-02-1946 DOA: 05/03/2020 PCP: [name], MD   Brief Narrative:  HPI: Shannon [name] is a 74 y.o. 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  At the rehab patient sustained a mechanical fall, suffering cervical fracture and cervical epidural hematoma, he was admitted on 10/29 to 04/28/2020 with a working diagnosis of cervical fracture with surgical epidural hematoma, quadriparesis and central cord syndrome.  He underwent revision of his fusion and evacuation of epidural hematoma.  For the last 4 days patient has been not feeling well, with a generalized malaise, cough and congestion.  Per his niece at the bedside he had difficulty swallowing, no recurrent falls.  He does have a pressure wound at his sacrum which has been treated with local wound care. He does have a chronic indwelling Foley catheter.  Today patient was noted to have significant increased work of breathing and tachycardia, no associated pain, no improving or worsening factors.  Due to persistent symptoms he was referred to the hospital for further evaluation.   ED Course:   Patient was ill looking appearing, he had positive leukocytosis and possible pneumonia.  He received 3 L of lactated Ringer's intravenously, antibiotic therapy with ceftriaxone and azithromycin.  He was referred for further hospitalization  Assessment & Plan:   Principal Problem:   Sepsis secondary to UTI [facility]) Active Problems:   Cervical spondylosis with myelopathy and radiculopathy   Quadriparesis (HCC)   Benign essential HTN   Cervical myelopathy (HCC)   Urinary retention   Chronic indwelling Foley catheter   Pressure injury of skin   Protein-calorie malnutrition, severe   1. Severe sepsis due to urinary  tract infection/aspiration pneumonia, present on admission, related to chronic indwelling Foley catheter: Patient meets sepsis criteria due to fever, tachycardia, tachypnea and leukocytosis and severe sepsis due to hypoxia and lactic acid of 2.7. Currently requiring about 5 L of oxygen. PCCM on board. Aspiration pneumonia secondary to dysphagia due to recent cervical vertebrae surgery. Seen by SLP. Moderate aspiration risk, remains n.p.o. until further recommendations. SLP to assess every day.  Mild lactic acidosis.  Repeat lactic acid.  Continue cefepime, Flagyl and azithromycin. Pulmonary toilet. We'll follow urine and blood culture which are negative thus far.  PCCM to come [name] for thoracentesis but he seemed to have consolidated/atelectatic lung.  Patient remains at very high risk of intubation.  He cannot control his secretions.  His daughter was approached by Dr. Merrily Pew of PCCM yesterday who explained all this to the daughter but daughter wishes to continue to keep him full code.  2.  Hypernatremia: Mild.  Monitor.  3.  Quadriplegia due to recent cervical vertebrae fracture.  Continue aspiration and fall precautions. PT/OT/SLP on board.  4.  Stage I-II sacrum decubitus ulcer.  Present on admission, continue local wound care. Consult wound care team.   5. HTN. Hold on amlodipine due to risk of hypotension as blood pressure remains on low normal side, continue close blood pressure monitoring.   6. BPH. Continue with tamsulosin. Foley in place.   7. Thrombocytopenia: Likely sepsis induced. No signs of bleeding. Monitor. Hopefully will improve with treatment of sepsis.  DVT prophylaxis: SCDs Start: 05/03/20 1507   Code Status: Full Code  Family Communication: None at bedside.  Tried calling daughter and left voicemail.  It appears that  PCCM had updated the daughter as well.  Status is: Inpatient  Remains inpatient appropriate because:Hemodynamically unstable   Dispo: The patient is  from: SNF              Anticipated d/c is to: SNF              Anticipated d/c date is: > 3 days              Patient currently is not medically stable to d/c.        Estimated body mass index is 25.33 kg/m as calculated from the following:   Height as of this encounter: 6' (1.829 m).   Weight as of this encounter: 84.7 kg.  Pressure Injury 05/03/20 Sacrum Right;Left;Medial Stage 2 -  Partial thickness loss of dermis presenting as a shallow open injury with a red, pink wound bed without slough. (Active)  05/03/20 2023  Location: Sacrum  Location Orientation: Right;Left;Medial  Staging: Stage 2 -  Partial thickness loss of dermis presenting as a shallow open injury with a red, pink wound bed without slough.  Wound Description (Comments):   Present on Admission: Yes     Nutritional status:  Nutrition Problem: Severe Malnutrition Etiology: chronic illness   Signs/Symptoms: moderate fat depletion, severe muscle depletion, percent weight loss (17.4% weight loss in less than 4 months) Percent weight loss: 17.4 %   Interventions: Magic cup, Hormel Shake    Consultants:   PCCM  Procedures:   None  Antimicrobials:  Anti-infectives (From admission, onward)   Start     Dose/Rate Route Frequency Ordered Stop   05/04/20 1800  cefTRIAXone (ROCEPHIN) 2 g in sodium chloride 0.9 % 100 mL IVPB  Status:  Discontinued        2 g 200 mL/hr over 30 Minutes Intravenous Every 24 hours 05/03/20 1905 05/04/20 0814   05/04/20 1300  azithromycin (ZITHROMAX) 500 mg in sodium chloride 0.9 % 250 mL IVPB        500 mg 250 mL/hr over 60 Minutes Intravenous Every 24 hours 05/03/20 2348     05/04/20 1000  ceFEPIme (MAXIPIME) 2 g in sodium chloride 0.9 % 100 mL IVPB        2 g 200 mL/hr over 30 Minutes Intravenous Every 8 hours 05/04/20 0832     05/04/20 1000  metroNIDAZOLE (FLAGYL) tablet 500 mg        500 mg Oral Every 8 hours 05/04/20 0832     05/03/20 1800  cefTRIAXone (ROCEPHIN) 1 g in  sodium chloride 0.9 % 100 mL IVPB  Status:  Discontinued        1 g 200 mL/hr over 30 Minutes Intravenous Every 24 hours 05/03/20 1754 05/03/20 1905   05/03/20 1300  cefTRIAXone (ROCEPHIN) 1 g in sodium chloride 0.9 % 100 mL IVPB  Status:  Discontinued        1 g 200 mL/hr over 30 Minutes Intravenous Every 24 hours 05/03/20 1250 05/03/20 1905   05/03/20 1300  azithromycin (ZITHROMAX) 500 mg in sodium chloride 0.9 % 250 mL IVPB  Status:  Discontinued        500 mg 250 mL/hr over 60 Minutes Intravenous Every 24 hours 05/03/20 1250 05/03/20 1754   05/03/20 1245  levofloxacin (LEVAQUIN) IVPB 750 mg  Status:  Discontinued        750 mg 100 mL/hr over 90 Minutes Intravenous  Once 05/03/20 1243 05/03/20 1250         Subjective: Patient seen and  examined this morning.  He was comfortable but was unable to speak anything.  Was able to open eyes with verbal command.  Objective: Vitals:   05/05/20 1335 05/05/20 1340 05/05/20 1345 05/05/20 1350  BP: 118/68 107/70  108/62  Pulse: (!) 115 (!) 115 (!) 111 (!) 108  Resp: (!) 32 (!) 29 (!) 26 18  Temp:      TempSrc:      SpO2: 100% 94% 95% 95%  Weight:      Height:        Intake/Output Summary (Last 24 hours) at 05/05/2020 1423 Last data filed at 05/05/2020 0824 Gross per 24 hour  Intake 1301.18 ml  Output 2400 ml  Net -1098.82 ml   Filed Weights   05/03/20 2120  Weight: 84.7 kg    Examination:  General exam: Appears calm and comfortable  Respiratory system: Diminished breath sounds bilaterally. Respiratory effort normal. Cardiovascular system: S1 & S2 heard, RRR. No JVD, murmurs, rubs, gallops or clicks. No pedal edema. Gastrointestinal system: Abdomen is nondistended, soft and nontender. No organomegaly or masses felt. Normal bowel sounds heard. Central nervous system: Awake.  Unable to assess orientation.  Quadriplegia Extremities: Symmetric 5 x 5 power.  Data Reviewed: I have personally reviewed following labs and imaging  studies  CBC: Recent Labs  Lab 05/03/20 1243 05/03/20 1321 05/04/20 0001 05/05/20 0158  WBC 19.4*  --  20.5* 16.6*  NEUTROABS 16.8*  --   --  14.9*  HGB 14.8 22.1* 12.9* 12.6*  HCT 47.4 65.0* 40.5 39.9  MCV 92.2  --  89.0 91.3  PLT 133*  --  117* 127*   Basic Metabolic Panel: Recent Labs  Lab 05/03/20 1243 05/03/20 1321 05/04/20 0001 05/05/20 0158  NA 147* 150* 145 150*  K 4.2 4.3 3.8 3.7  CL 111  --  113* 117*  CO2 27  --  25 26  GLUCOSE 158*  --  167* 127*  BUN 20  --  19 19  CREATININE 1.15  --  0.82 0.96  CALCIUM 8.5*  --  8.0* 8.2*  MG  --   --   --  2.1   GFR: Estimated Creatinine Clearance: 74.1 mL/min (by C-G formula based on SCr of 0.96 mg/dL). Liver Function Tests: Recent Labs  Lab 05/03/20 1243 05/05/20 0158  AST 81* 50*  ALT 157* 89*  ALKPHOS 90 81  BILITOT 1.7* 1.0  PROT 6.2* 5.3*  ALBUMIN 2.2* 1.6*   No results for input(s): LIPASE, AMYLASE in the last 168 hours. No results for input(s): AMMONIA in the last 168 hours. Coagulation Profile: Recent Labs  Lab 05/03/20 1243  INR 1.7*   Cardiac Enzymes: No results for input(s): CKTOTAL, CKMB, CKMBINDEX, TROPONINI in the last 168 hours. BNP (last 3 results) No results for input(s): PROBNP in the last 8760 hours. HbA1C: No results for input(s): HGBA1C in the last 72 hours. CBG: Recent Labs  Lab 05/04/20 0755  GLUCAP 115*   Lipid Profile: No results for input(s): CHOL, HDL, LDLCALC, TRIG, CHOLHDL, LDLDIRECT in the last 72 hours. Thyroid Function Tests: No results for input(s): TSH, T4TOTAL, FREET4, T3FREE, THYROIDAB in the last 72 hours. Anemia Panel: No results for input(s): VITAMINB12, FOLATE, FERRITIN, TIBC, IRON, RETICCTPCT in the last 72 hours. Sepsis Labs: Recent Labs  Lab 05/03/20 1301 05/03/20 1538 05/04/20 0821 05/04/20 1117  LATICACIDVEN 2.7* 2.5* 1.4 2.1*    Recent Results (from the past 240 hour(s))  SARS Coronavirus 2 by RT PCR (hospital order, performed  in [facility] hospital lab) Nasopharyngeal Nasopharyngeal Swab     Status: None   Collection Time: 04/27/20  2:35 PM   Specimen: Nasopharyngeal Swab  Result Value Ref Range Status   SARS Coronavirus 2 NEGATIVE NEGATIVE Final    Comment: (NOTE) SARS-CoV-2 target nucleic acids are NOT DETECTED.  The SARS-CoV-2 RNA is generally detectable in upper and lower respiratory specimens during the acute phase of infection. The lowest concentration of SARS-CoV-2 viral copies this assay can detect is 250 copies / mL. A negative result does not preclude SARS-CoV-2 infection and should not be used as the sole basis for treatment or other patient management decisions.  A negative result may occur with improper specimen collection / handling, submission of specimen other than nasopharyngeal swab, presence of viral mutation(s) within the areas targeted by this assay, and inadequate number of viral copies (<250 copies / mL). A negative result must be combined with clinical observations, patient history, and epidemiological information.  Fact Sheet for Patients:   BoilerBrush.com.cy  Fact Sheet for Healthcare Providers: https://pope.com/  This test is not yet approved or  cleared by the [place] FDA and has been authorized for detection and/or diagnosis of SARS-CoV-2 by FDA under an Emergency Use Authorization (EUA).  This EUA will remain in effect (meaning this test can be used) for the duration of the COVID-19 declaration under Section 564(b)(1) of the Act, 21 U.S.C. section 360bbb-3(b)(1), unless the authorization is terminated or revoked sooner.  Performed at [facility], [address] [place], [place] [address]   Urine culture     Status: Abnormal (Preliminary result)   Collection Time: 05/03/20 12:43 PM   Specimen: In/Out Cath Urine  Result Value Ref Range Status   Specimen Description IN/OUT CATH URINE  Final   Special Requests NONE   Final   Culture (A)  Final    >=100,000 COLONIES/mL KLEBSIELLA PNEUMONIAE >=100,000 COLONIES/mL ESCHERICHIA COLI SUSCEPTIBILITIES TO FOLLOW Performed at [facility], [address] [place], [place] [address]    Report Status PENDING  Incomplete  Blood Culture (routine x 2)     Status: None (Preliminary result)   Collection Time: 05/03/20  1:00 PM   Specimen: BLOOD  Result Value Ref Range Status   Specimen Description BLOOD SITE NOT SPECIFIED  Final   Special Requests   Final    BOTTLES DRAWN AEROBIC AND ANAEROBIC Blood Culture results may not be optimal due to an inadequate volume of blood received in culture bottles   Culture   Final    NO GROWTH 2 DAYS Performed at [facility], [address], [place], [place] [address]    Report Status PENDING  Incomplete  Blood Culture (routine x 2)     Status: None (Preliminary result)   Collection Time: 05/03/20  1:16 PM   Specimen: BLOOD  Result Value Ref Range Status   Specimen Description BLOOD SITE NOT SPECIFIED  Final   Special Requests   Final    BOTTLES DRAWN AEROBIC AND ANAEROBIC Blood Culture results may not be optimal due to an inadequate volume of blood received in culture bottles   Culture   Final    NO GROWTH 2 DAYS Performed at [facility], [address], [place], [place] [address]    Report Status PENDING  Incomplete  Respiratory Panel by RT PCR (Flu A&B, Covid) - Nasopharyngeal Swab     Status: None   Collection Time: 05/03/20 11:38 PM  Specimen: Nasopharyngeal Swab  Result Value Ref Range Status   SARS Coronavirus 2 by RT PCR NEGATIVE NEGATIVE Final    Comment: (NOTE) SARS-CoV-2 target nucleic acids are NOT DETECTED.  The SARS-CoV-2 RNA is generally detectable in upper respiratoy specimens during the acute phase of infection. The lowest concentration of SARS-CoV-2 viral copies this assay can detect is 131 copies/mL. A negative result does not preclude SARS-Cov-2 infection and should not be  used as the sole basis for treatment or other patient management decisions. A negative result may occur with  improper specimen collection/handling, submission of specimen other than nasopharyngeal swab, presence of viral mutation(s) within the areas targeted by this assay, and inadequate number of viral copies (<131 copies/mL). A negative result must be combined with clinical observations, patient history, and epidemiological information. The expected result is Negative.  Fact Sheet for Patients:  https://www.moore.com/https://www.fda.gov/media/142436/download  Fact Sheet for Healthcare Providers:  https://www.young.biz/https://www.fda.gov/media/142435/download  This test is no t yet approved or cleared by the [place] FDA and  has been authorized for detection and/or diagnosis of SARS-CoV-2 by FDA under an Emergency Use Authorization (EUA). This EUA will remain  in effect (meaning this test can be used) for the duration of the COVID-19 declaration under Section 564(b)(1) of the Act, 21 U.S.C. section 360bbb-3(b)(1), unless the authorization is terminated or revoked sooner.     Influenza A by PCR NEGATIVE NEGATIVE Final   Influenza B by PCR NEGATIVE NEGATIVE Final    Comment: (NOTE) The Xpert Xpress SARS-CoV-2/FLU/RSV assay is intended as an aid in  the diagnosis of influenza from Nasopharyngeal swab specimens and  should not be used as a sole basis for treatment. Nasal washings and  aspirates are unacceptable for Xpert Xpress SARS-CoV-2/FLU/RSV  testing.  Fact Sheet for Patients: https://www.moore.com/https://www.fda.gov/media/142436/download  Fact Sheet for Healthcare Providers: https://www.young.biz/https://www.fda.gov/media/142435/download  This test is not yet approved or cleared by the [place] FDA and  has been authorized for detection and/or diagnosis of SARS-CoV-2 by  FDA under an Emergency Use Authorization (EUA). This EUA will remain  in effect (meaning this test can be used) for the duration of the  Covid-19 declaration under Section  564(b)(1) of the Act, 21  U.S.C. section 360bbb-3(b)(1), unless the authorization is  terminated or revoked. Performed at [facility], [address] [place], [place] [id number]       Radiology Studies: CT Head Wo Contrast  Result Date: 05/03/2020 CLINICAL DATA:  Facial trauma after multiple falls. EXAM: CT HEAD WITHOUT CONTRAST CT CERVICAL SPINE WITHOUT CONTRAST TECHNIQUE: Multidetector CT imaging of the head and cervical spine was performed following the standard protocol without intravenous contrast. Multiplanar CT image reconstructions of the cervical spine were also generated. COMPARISON:  April 21, 2020. FINDINGS: CT HEAD FINDINGS Brain: Mild chronic ischemic white matter disease is noted. No mass effect or midline shift is noted. Ventricular size is within normal limits. There is no evidence of mass lesion, hemorrhage or acute infarction. Vascular: No hyperdense vessel or unexpected calcification. Skull: Normal. Negative for fracture or focal lesion. Sinuses/Orbits: No acute finding. Other: None. CT CERVICAL SPINE FINDINGS Alignment: Normal. Skull base and vertebrae: No acute fracture is noted. Postsurgical changes are seen involving the C3, C4 and C5 vertebral bodies. Soft tissues and spinal canal: No prevertebral fluid or swelling. No visible canal hematoma. Disc levels: Status post surgical anterior fusion of C3-4 and C4-5. There is fusion of the C5-6 and C6-7 disc spaces secondary to degenerative change. Upper chest: Negative. Other: None.  IMPRESSION: 1. Mild chronic ischemic white matter disease. No acute intracranial abnormality seen. 2. Postsurgical and degenerative changes as described above. No acute abnormality seen in the cervical spine. Electronically Signed   By: Lupita Raider M.D.   On: 05/03/2020 14:41   CT Chest Wo Contrast  Result Date: 05/03/2020 CLINICAL DATA:  Short of breath, fever, sepsis, multiple falls EXAM: CT CHEST WITHOUT CONTRAST TECHNIQUE:  Multidetector CT imaging of the chest was performed following the standard protocol without IV contrast. COMPARISON:  05/03/2020 FINDINGS: Cardiovascular: Unenhanced imaging of the heart and great vessels demonstrates trace pericardial fluid. The heart is not enlarged. Aneurysmal dilatation of the ascending thoracic aorta measuring up to 4.4 cm. Evaluation of the vascular lumen is limited without intravenous contrast. Mediastinum/Nodes: No enlarged mediastinal or axillary lymph nodes. Thyroid gland, trachea, and esophagus demonstrate no significant findings. Lungs/Pleura: There is diffuse bronchial wall thickening. Complete opacification of the bronchus intermedius and right middle and right lower lobe bronchi are seen, along with partial opacification of the left lower lobe bronchus. There is patchy airspace disease within the right upper and left lower lobes. Complete consolidation of the right middle and right lower lobes with associated volume loss consistent with atelectasis. No effusion or pneumothorax. Upper Abdomen: Calcified gallstone is identified. The remainder of the upper abdomen is unremarkable. Musculoskeletal: Severe left shoulder osteoarthritis. No acute or destructive bony lesions. Reconstructed images demonstrate no additional findings. IMPRESSION: 1. Diffuse bronchial wall thickening, with patchy airspace disease of the right upper and left lower lobes, consistent with bronchopneumonia. 2. Opacification of the right middle, right lower, and left lower lobe bronchi, with complete collapse of the right middle and right lower lobes. This may be related to mucous plugging or underlying infection. 3. Trace pericardial fluid. 4. Cholelithiasis without cholecystitis. 5. 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 Recommend annual imaging followup by CTA or MRA. This recommendation follows 2010 ACCF/AHA/AATS/ACR/ASA/SCA/SCAI/SIR/STS/SVM Guidelines for the Diagnosis and  Management of Patients with Thoracic Aortic Disease. Circulation. 2010; 121: Z610-R604. Aortic aneurysm NOS (ICD10-I71.9) Electronically Signed   By: Sharlet Salina M.D.   On: 05/03/2020 16:31   CT Cervical Spine Wo Contrast  Result Date: 05/03/2020 CLINICAL DATA:  Facial trauma after multiple falls. EXAM: CT HEAD WITHOUT CONTRAST CT CERVICAL SPINE WITHOUT CONTRAST TECHNIQUE: Multidetector CT imaging of the head and cervical spine was performed following the standard protocol without intravenous contrast. Multiplanar CT image reconstructions of the cervical spine were also generated. COMPARISON:  April 21, 2020. FINDINGS: CT HEAD FINDINGS Brain: Mild chronic ischemic white matter disease is noted. No mass effect or midline shift is noted. Ventricular size is within normal limits. There is no evidence of mass lesion, hemorrhage or acute infarction. Vascular: No hyperdense vessel or unexpected calcification. Skull: Normal. Negative for fracture or focal lesion. Sinuses/Orbits: No acute finding. Other: None. CT CERVICAL SPINE FINDINGS Alignment: Normal. Skull base and vertebrae: No acute fracture is noted. Postsurgical changes are seen involving the C3, C4 and C5 vertebral bodies. Soft tissues and spinal canal: No prevertebral fluid or swelling. No visible canal hematoma. Disc levels: Status post surgical anterior fusion of C3-4 and C4-5. There is fusion of the C5-6 and C6-7 disc spaces secondary to degenerative change. Upper chest: Negative. Other: None. IMPRESSION: 1. Mild chronic ischemic white matter disease. No acute intracranial abnormality seen. 2. Postsurgical and degenerative changes as described above. No acute abnormality seen in the cervical spine. Electronically Signed   By: Zenda Alpers.D.  On: 05/03/2020 14:41   DG Chest Port 1 View  Result Date: 05/05/2020 CLINICAL DATA:  Respiratory distress. EXAM: PORTABLE CHEST 1 VIEW COMPARISON:  05/03/2020 FINDINGS: The cardiac silhouette,  mediastinal and hilar contours are within normal limits and stable. Moderate-sized right pleural effusion with overlying atelectasis or infiltrate. Patchy left basilar infiltrate. IMPRESSION: Moderate-sized right pleural effusion with overlying atelectasis or infiltrate. Patchy left basilar infiltrate. Electronically Signed   By: Rudie Meyer M.D.   On: 05/05/2020 07:15   DG CHEST PORT 1 VIEW  Result Date: 05/04/2020 CLINICAL DATA:  Respiratory distress EXAM: PORTABLE CHEST 1 VIEW COMPARISON:  05/03/2020 FINDINGS: Unchanged right basilar consolidation. Cardiomediastinal contours and pleural spaces are normal. Left lung is clear. IMPRESSION: Unchanged right basilar consolidation. Electronically Signed   By: Deatra Robinson M.D.   On: 05/04/2020 00:07   DG Swallowing Func-Speech Pathology  Result Date: 05/04/2020 Objective Swallowing Evaluation: Type of Study: Bedside Swallow Evaluation  Patient Details Name: Shannon [name] MRN: [id number] Date of Birth: 01-Aug-1945 Today's Date: 05/04/2020 Time: SLP Start Time (ACUTE ONLY): 1044 -SLP Stop Time (ACUTE ONLY): 1103 SLP Time Calculation (min) (ACUTE ONLY): 19 min Past Medical History: Past Medical History: Diagnosis Date . Asthma  . 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 Newell Coral, 1992-->surg at [facility].  Summer 2021->Cervicalgia,arm pain, hand atrophy RUE wkness, hyperreflex (Dr. 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 Hay fever  . Hypertension  . Osteoarthritis of both hands  Past Surgical History: Past Surgical History: Procedure Laterality Date . ANTERIOR CERVICAL DECOMP/DISCECTOMY FUSION N/A 04/05/2020  Procedure: ANTERIOR CERVICAL DECOMPRESSION/DISCECTOMY FUSION, INTERBODY PROSTHESIS, PLATE/SCREWS CERVICAL THREE-CERVICAL FOUR, CERVICAL FOUR- CERVICAL FIVE;  Surgeon: Tressie Stalker, MD;  Location[facility] OR;  Service:  Neurosurgery;  Laterality: N/A; . ANTERIOR CERVICAL DECOMP/DISCECTOMY FUSION N/A 04/22/2020  Procedure: Reexploration of anterior cervical wound for epidural hematoma;  Surgeon: Donalee Citrin, MD;  Location: [facility] OR;  Service: Neurosurgery;  Laterality: N/A; . BACK SURGERY   . INCISION AND DRAINAGE Left 2012  L hand infection . ROTATOR CUFF REPAIR Right   x 3 . SPINE SURGERY   HPI: 74 year old white male who presented with right lower lobe pneumonia and known history of  Quadriparesis.  He originally had a C3-4 [place] 4-5 anterior cervical disc asked to me with decompression, C3 3-4 and C4-5 interbody arthrodesis with local autograft bone, anterior cervical plating of C3-C5 on 04/05/2020.  He then went to rehab and was discharged.  On 04/21/2020 the patient presented to the emergency room with progressive bilateral quadriplegia.  Patient went to the operating room and had decompression of a vertebral hematoma.  And been discharged on 11 10/2019 and readmitted on 05/03/2020 for increasing shortness of breath and productive cough.  Chest x-ray demonstrated a new right lower lobe infiltrate with compressive atelectasis, concerning for aspiration pneumonia.  Pt had MBSS during most recent admission on 04/25/2020 with recommendations for puree and thin liquids, advanced to Dys3 prior to discharge  Subjective: alert, cooperative, pleasant, participative. Assessment / Plan / Recommendation CHL IP CLINICAL IMPRESSIONS 05/04/2020 Clinical Impression Pt presents with moderate oropharyngeal dysphagia c/b decreased base of tongue retraction, delayed swallow inititation, incomplete laryngeal closure, reduced pharyngeal perstalsis, decreased UES opening, and diminished sensation. These deficits resulted in silent aspiration of thin liquid and nectar thick liquid by straw prior to the swallow. With nectar thick liquid by cup, only transient penetration was seen.  There was significant pharyngeal residue with puree and solid consistencies  which was reduced but not fully cleared with liquid wash. There is noticeable prevertebral edema in pharynx which appears to be impacting swallow function, but pt is able to acheive adequate epiglottic deflection. This study represents a marked decline in swallow function from MBSS on 04/25/20; however, from review of images in both studies, amount of edema appears comparable to naked eye without any accurate measurement, or comparing level of magnification during each study. Because of recent ACDF (11/13) and revision (10/29), and presence of cervical collar, SLP intervention options are limited outside of modification of diet. Pt cannot trial compensatory strategies requiring change to head/neck positioning, and many swallow exercises cannot be executed in this position. Pt may benefit from some tongue strengthening exercises to improve swallow drive and decrease vallecula residue. Recommend initiating full liquid diet with nectar thick liquids, by cup sip only. NO STRAWS. Please crush medications if permissible. Called neice, [name], and shared results of today's study which she will pass on to pt's daughter who will be coming to town tomorrow. SLP Visit Diagnosis -- Attention and concentration deficit following -- Frontal lobe and executive function deficit following -- Impact on safety and function --   CHL IP TREATMENT RECOMMENDATION 05/04/2020 Treatment Recommendations Therapy as outlined in treatment plan below   Prognosis 05/04/2020 Prognosis for Safe Diet Advancement Fair Barriers to Reach Goals Cognitive deficits Barriers/Prognosis Comment -- CHL IP DIET RECOMMENDATION 05/04/2020 SLP Diet Recommendations Nectar thick liquid Liquid Administration via No straw Medication Administration -- Compensations Slow rate;Small sips/bites;Follow solids with liquid Postural Changes Seated upright at 90 degrees;Remain semi-upright after after feeds/meals (Comment)   CHL IP OTHER RECOMMENDATIONS 05/04/2020 Recommended  Consults -- Oral Care Recommendations Oral care BID Other Recommendations --   CHL IP FOLLOW UP RECOMMENDATIONS 05/04/2020 Follow up Recommendations (No Data)   CHL IP FREQUENCY AND DURATION 05/04/2020 Speech Therapy Frequency (ACUTE ONLY) min 2x/week Treatment Duration 2 weeks      CHL IP ORAL PHASE 05/04/2020 Oral Phase -- Oral - Pudding Teaspoon -- Oral - Pudding Cup -- Oral - Honey Teaspoon -- Oral - Honey Cup -- Oral - Nectar Teaspoon -- Oral - Nectar Cup WFL Oral - Nectar Straw Premature spillage;Decreased bolus cohesion Oral - Thin Teaspoon -- Oral - Thin Cup Premature spillage Oral - Thin Straw Premature spillage Oral - Puree WFL Oral - Mech Soft WFL Oral - Regular -- Oral - Multi-Consistency -- Oral - Pill WFL Oral Phase - Comment --  CHL IP PHARYNGEAL PHASE 05/04/2020 Pharyngeal Phase Impaired Pharyngeal- Pudding Teaspoon -- Pharyngeal -- Pharyngeal- Pudding Cup -- Pharyngeal -- Pharyngeal- Honey Teaspoon -- Pharyngeal -- Pharyngeal- Honey Cup -- Pharyngeal -- Pharyngeal- Nectar Teaspoon -- Pharyngeal -- Pharyngeal- Nectar Cup -- Pharyngeal -- Pharyngeal- Nectar Straw -- Pharyngeal -- Pharyngeal- Thin Teaspoon -- Pharyngeal -- Pharyngeal- Thin Cup -- Pharyngeal -- Pharyngeal- Thin Straw -- Pharyngeal -- Pharyngeal- Puree -- Pharyngeal -- Pharyngeal- Mechanical Soft -- Pharyngeal -- Pharyngeal- Regular -- Pharyngeal -- Pharyngeal- Multi-consistency -- Pharyngeal -- Pharyngeal- Pill -- Pharyngeal -- Pharyngeal Comment --  CHL IP CERVICAL ESOPHAGEAL PHASE 05/04/2020 Cervical Esophageal Phase Impaired Pudding Teaspoon -- Pudding Cup -- Honey Teaspoon -- Honey Cup -- Nectar Teaspoon -- Nectar Cup Reduced cricopharyngeal relaxation Nectar Straw Reduced cricopharyngeal relaxation Thin Teaspoon -- Thin Cup Reduced cricopharyngeal relaxation Thin Straw Reduced cricopharyngeal relaxation Puree Reduced cricopharyngeal relaxation Mechanical Soft Reduced cricopharyngeal relaxation Regular -- Multi-consistency -- Pill  Reduced cricopharyngeal relaxation Cervical Esophageal Comment trace  esophageal retention of contrast Kerrie Pleasure, MA, CCC-SLP Acute Rehabilitation Services Office: [phone] 05/04/2020, 11:59 AM               Scheduled Meds: . Chlorhexidine Gluconate Cloth  6 each Topical Daily  . furosemide  40 mg Intravenous Q6H  . metroNIDAZOLE  500 mg Oral Q8H  . pantoprazole (PROTONIX) IV  40 mg Intravenous Q24H  . polyethylene glycol  17 g Oral Daily  . tamsulosin  0.4 mg Oral Daily   Continuous Infusions: . azithromycin 20 mL/hr at 05/04/20 1635  . ceFEPime (MAXIPIME) IV 2 g (05/05/20 0824)     LOS: 2 days   Time spent: 30 minutes  Hughie Closs, MD [facility]  05/05/2020, 2:23 PM   To contact the attending provider between 7A-7P or the covering provider during after hours 7P-7A, please log into the web site [email or web address].  Addendum: I was able to speak to his daughter.  I did explain to her the poor prognosis that her father has.  I reconfirmed with her and she wants to keep him full code and would like for him to be intubated if need be.

## 2020-05-05 NOTE — Progress Notes (Signed)
Dr Kendrick Fries and NP at bedside, thoracentesis cancelled, fluid in lung not in pleural space, Ultrasound completed, safety maintained, see flowsheet for VS

## 2020-05-05 NOTE — Progress Notes (Signed)
NAME:  Shannon [name], MRN:  [id number], DOB:  1945/10/23, LOS: 2 ADMISSION DATE:  05/03/2020, CONSULTATION DATE: 05/04/2020 REFERRING MD: Dr Loney Loh, CHIEF COMPLAINT:  SOB Called for consult at 03:22 Pt evaluated at 03:28  Brief History   74 year old white male with new right lower lobe pneumonia  History of present illness   74 year old white male who presented with right lower lobe pneumonia and known history of  Quadriparesis.  He originally had a C3-4 [place] 4-5 anterior cervical disc asked to me with decompression, C3 3-4 and C4-5 interbody arthrodesis with local autograft bone, anterior cervical plating of C3-C5 on 04/05/2020.  He then went to rehab and was discharged.  On 04/21/2020 the patient presented to the emergency room with progressive bilateral quadriplegia.  Patient went to the operating room and had decompression of a vertebral hematoma.  And been discharged on 11 10/2019 and readmitted on 05/03/2020 for increasing shortness of breath and productive cough.  Chest x-ray demonstrated a new right lower lobe infiltrate with compressive atelectasis.  Critical care medicine was consulted because of increasing oxygen demand and concern for respiratory pattern.  Currently the patient will nod appropriately to questions has a better than expected cough on demand.  Does not currently appear to be in any distress.  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 rate 18, height 6' (1.829 m), weight 84.7 kg, SpO2  91 %.    FiO2 (%):  [36 %] 36 %   Intake/Output Summary (Last 24 hours) at 05/05/2020 1038 Last data filed at 05/04/2020 2314 Gross per 24 hour  Intake 350 ml  Output 2400 ml  Net -2050 ml   Filed Weights   05/03/20 2120  Weight: 84.7 kg    Examination: General: Frail elderly male who is quadriparetic HEENT: MM pink/moist Aspen collar is in place surgical dressing is in place neck Neuro: Quadriparetic with some movement in feet and hands CV: Heart sounds are regular PULM: Abdominal chest wall paradoxus with poor air movement   GI: soft, bsx4 active  GU: Amber urine Extremities: warm/dry, 2+ edema documented sacral decubitus Skin: no rashes or lesions      Assessment & Plan:   Respiratory distress in the setting of recent surgical surgeries x2 with a history of fall with epidural hematoma now with quadriparesis and suspected pneumonia with questionable aspiration component  O2 supplementation Major concern for requirement for intubation Thoracentesis for moderate right pleural effusion   Pulmonary toilet Day 2 Zithromax, Maxipime Dysphagia diet I suspect he will end up intubated and transferred to the intensive care unit    Quadriparesis in the setting of surgical interventions x2 per   Consider NS to examine neck wound  Sacral decubitus  Per primary         Labs   CBC: Recent Labs  Lab 05/03/20 1243 05/03/20 1321 05/04/20 0001 05/05/20 0158  WBC 19.4*  --  20.5* 16.6*  NEUTROABS 16.8*  --   --  14.9*  HGB 14.8 22.1* 12.9* 12.6*  HCT 47.4 65.0* 40.5 39.9  MCV 92.2  --  89.0 91.3  PLT 133*  --  117* 127*    Basic Metabolic Panel: Recent Labs  Lab 05/03/20 1243 05/03/20 1321 05/04/20 0001 05/05/20 0158  NA 147* 150* 145 150*  K 4.2 4.3 3.8 3.7  CL 111  --  113* 117*  CO2 27  --  25 26  GLUCOSE 158*  --  167* 127*  BUN 20  --  19 19  CREATININE 1.15  --  0.82 0.96  CALCIUM 8.5*  --  8.0* 8.2*  MG  --   --   --  2.1    GFR: Estimated Creatinine Clearance: 74.1 mL/min (by C-G formula based on SCr of 0.96 mg/dL). Recent Labs  Lab 05/03/20 1243 05/03/20 1301 05/03/20 1538 05/04/20 0001 05/04/20 0821 05/04/20 1117 05/05/20 0158  WBC 19.4*  --   --  20.5*  --   --  16.6*  LATICACIDVEN  --  2.7* 2.5*  --  1.4 2.1*  --     Liver Function Tests: Recent Labs  Lab 05/03/20 1243 05/05/20 0158  AST 81* 50*  ALT 157* 89*  ALKPHOS 90 81  BILITOT 1.7* 1.0  PROT 6.2* 5.3*  ALBUMIN 2.2* 1.6*   No results for input(s): LIPASE, AMYLASE in the last 168 hours. No results for input(s): AMMONIA in the last 168 hours.  ABG    Component Value Date/Time   PHART 7.504 (H) 05/03/2020 2303   PCO2ART 34.2 05/03/2020 2303   PO2ART 66.5 (L) 05/03/2020 2303   HCO3 26.9 05/03/2020 2303   TCO2 33 (H) 05/03/2020 1321   O2SAT 94.3 05/03/2020 2303     Coagulation Profile: Recent Labs  Lab 05/03/20 1243  INR 1.7*    Cardiac Enzymes: No results for input(s): CKTOTAL, CKMB, CKMBINDEX, TROPONINI in the last 168 hours.  HbA1C: No results found for: HGBA1C  CBG: Recent Labs  Lab 05/04/20 0755  GLUCAP 115Brett Canales Kamrynn Melott ACNP Acute Care Nurse Practitioner [facility] Pul[facility]/Critical Care Please consult Amion 05/05/2020, 10:38 AM

## 2020-05-05 NOTE — Progress Notes (Signed)
[facility] PCCM  We brought Mr. Feggins down to the endoscopy suite for a thoracentesis but bedside ultrasound revealed no drainable fluid.  He had consolidated/atelectatic lung.    Plan  Lasix Close monitoring  Daughter updated  [name] [place]bury, MD [facility] Pager: [phone] Cell: [phone] If no response, call [phone]

## 2020-05-05 NOTE — Plan of Care (Signed)

## 2020-05-05 NOTE — Progress Notes (Signed)
SLP Cancellation Note  Patient Details Name: Shannon [name] MRN: [id number] DOB: 09-Jun-1946   Cancelled treatment:       Reason Eval/Treat Not Completed: Patient at procedure or test/unavailable   Yamile Roedl, Riley Nearing 05/05/2020, 2:07 PM

## 2020-05-05 NOTE — Progress Notes (Signed)
[facility] PCCM  Repeat exam/evening rounds  Breathing comfortably, no accessory muscle use, no paradoxical abdominal movement, oxygenation  He says he is breathing better Has been making lots of urine since receiving lasix Repeat lasix again over night as ordered  [name] Pontoosuc, MD  PCCM Pager: [phone] Cell: [phone] If no response, call [phone]

## 2020-05-06 ENCOUNTER — Inpatient Hospital Stay (HOSPITAL_COMMUNITY): Payer: Medicare Other

## 2020-05-06 DIAGNOSIS — J9601 Acute respiratory failure with hypoxia: Secondary | ICD-10-CM | POA: Diagnosis not present

## 2020-05-06 DIAGNOSIS — A419 Sepsis, unspecified organism: Secondary | ICD-10-CM | POA: Diagnosis not present

## 2020-05-06 DIAGNOSIS — J690 Pneumonitis due to inhalation of food and vomit: Secondary | ICD-10-CM

## 2020-05-06 DIAGNOSIS — N390 Urinary tract infection, site not specified: Secondary | ICD-10-CM | POA: Diagnosis not present

## 2020-05-06 LAB — CBC WITH DIFFERENTIAL/PLATELET
Abs Immature Granulocytes: 0.14 10*3/uL — ABNORMAL HIGH (ref 0.00–0.07)
Basophils Absolute: 0 10*3/uL (ref 0.0–0.1)
Basophils Relative: 0 %
Eosinophils Absolute: 0 10*3/uL (ref 0.0–0.5)
Eosinophils Relative: 0 %
HCT: 42.3 % (ref 39.0–52.0)
Hemoglobin: 13.3 g/dL (ref 13.0–17.0)
Immature Granulocytes: 1 %
Lymphocytes Relative: 5 %
Lymphs Abs: 0.9 10*3/uL (ref 0.7–4.0)
MCH: 28.4 pg (ref 26.0–34.0)
MCHC: 31.4 g/dL (ref 30.0–36.0)
MCV: 90.2 fL (ref 80.0–100.0)
Monocytes Absolute: 0.7 10*3/uL (ref 0.1–1.0)
Monocytes Relative: 4 %
Neutro Abs: 15.7 10*3/uL — ABNORMAL HIGH (ref 1.7–7.7)
Neutrophils Relative %: 90 %
Platelets: 152 10*3/uL (ref 150–400)
RBC: 4.69 MIL/uL (ref 4.22–5.81)
RDW: 14.3 % (ref 11.5–15.5)
WBC: 17.6 10*3/uL — ABNORMAL HIGH (ref 4.0–10.5)
nRBC: 0 % (ref 0.0–0.2)

## 2020-05-06 LAB — GLUCOSE, CAPILLARY: Glucose-Capillary: 128 mg/dL — ABNORMAL HIGH (ref 70–99)

## 2020-05-06 LAB — URINE CULTURE: Culture: >=100000 — AB

## 2020-05-06 LAB — BASIC METABOLIC PANEL
Anion gap: 10 (ref 5–15)
BUN: 22 mg/dL (ref 8–23)
CO2: 27 mmol/L (ref 22–32)
Calcium: 8.2 mg/dL — ABNORMAL LOW (ref 8.9–10.3)
Chloride: 114 mmol/L — ABNORMAL HIGH (ref 98–111)
Creatinine, Ser: 1.06 mg/dL (ref 0.61–1.24)
GFR, Estimated: >60 mL/min (ref >60)
Glucose, Bld: 144 mg/dL — ABNORMAL HIGH (ref 70–99)
Potassium: 3.5 mmol/L (ref 3.5–5.1)
Sodium: 151 mmol/L — ABNORMAL HIGH (ref 135–145)

## 2020-05-06 LAB — MAGNESIUM: Magnesium: 2.1 mg/dL (ref 1.7–2.4)

## 2020-05-06 LAB — PHOSPHORUS: Phosphorus: 3.3 mg/dL (ref 2.5–4.6)

## 2020-05-06 IMAGING — DX DG CHEST 1V PORT
1 series · 1 of 1 positions shown · non-contrast
Comparison: [DATE]

CLINICAL DATA: Shortness of breath

EXAM:
PORTABLE CHEST 1 VIEW

[chest ap]
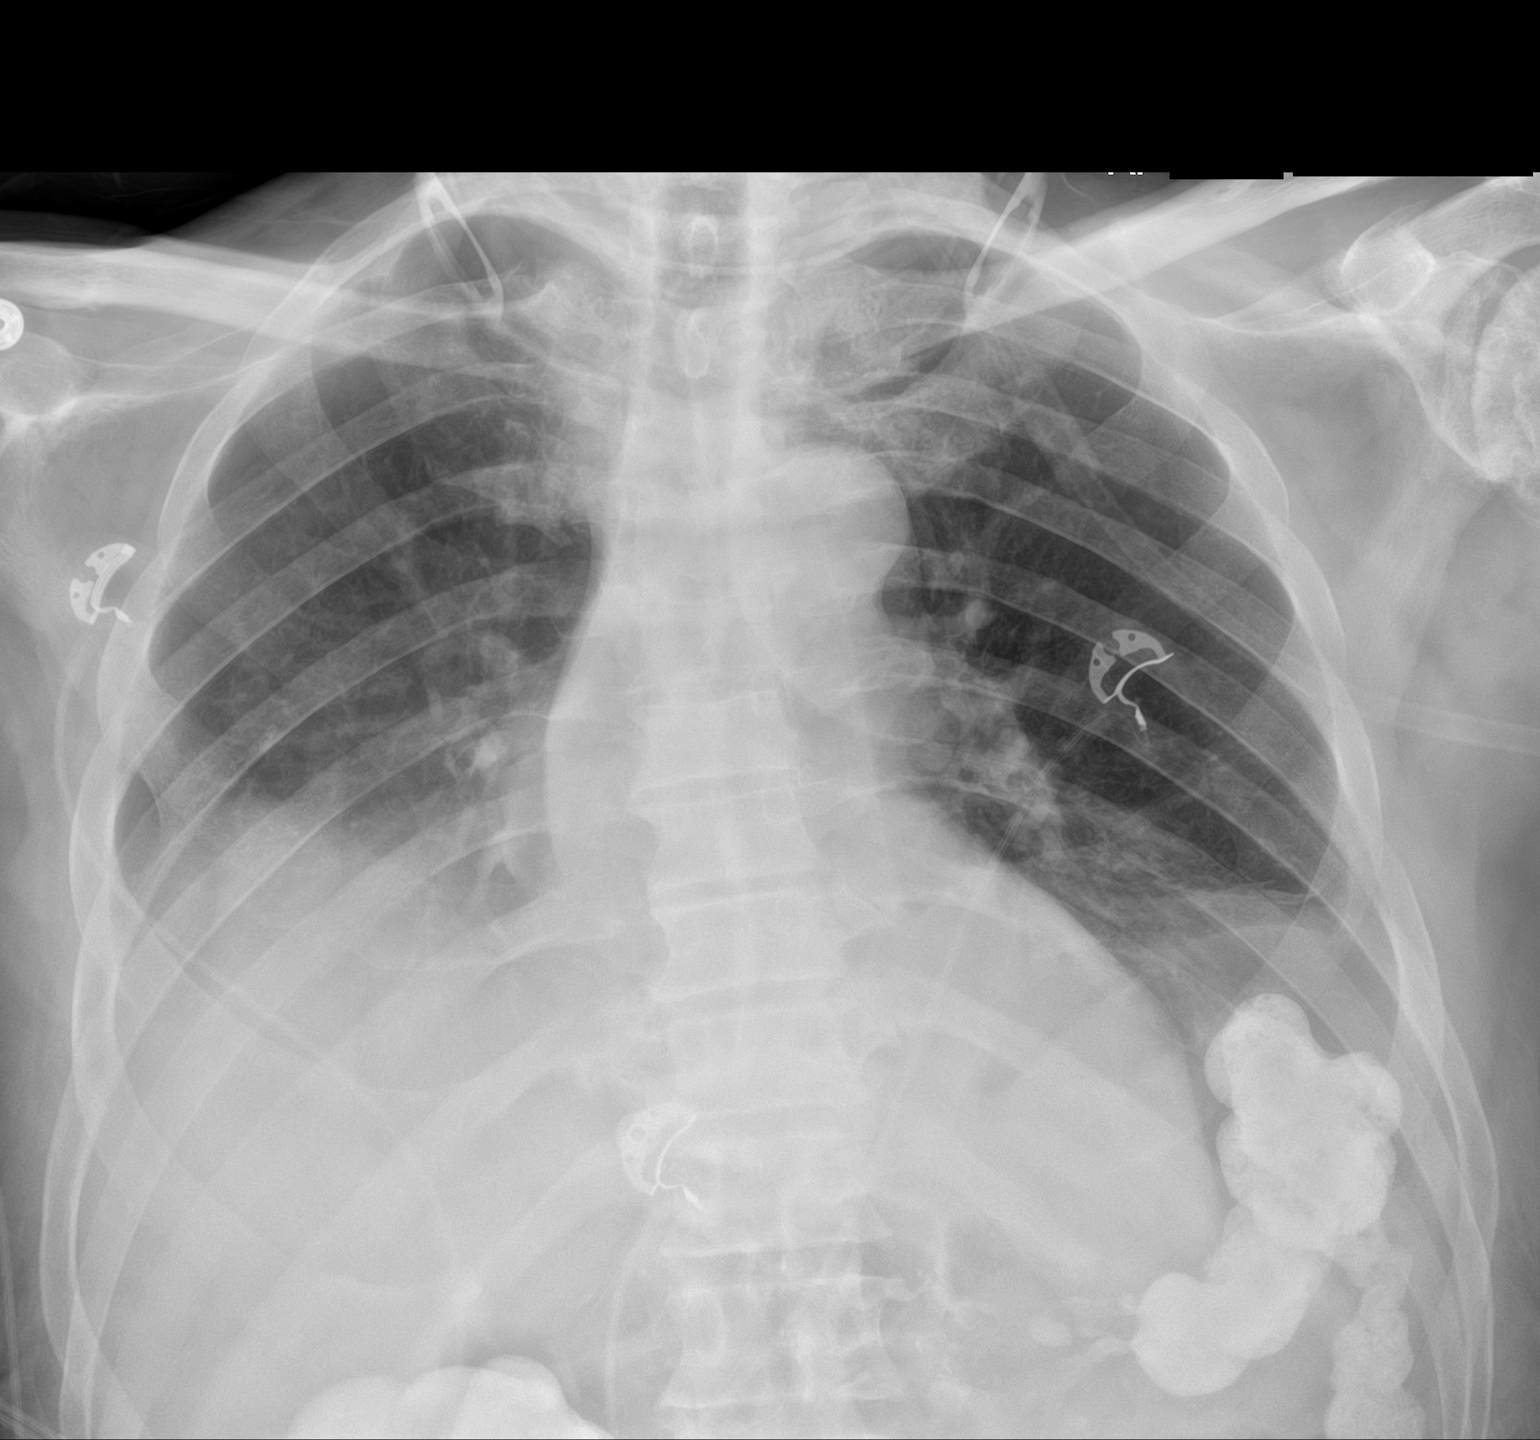

[1 of 1 positions shown; findings below may reference images not displayed]

FINDINGS: Persistent right pleural effusion with areas of atelectatic change
in the lung bases noted. Heart size and pulmonary vascularity are
normal. No adenopathy. No bone lesions.
IMPRESSION: Stable right pleural effusion with bibasilar atelectasis. A degree
of superimposed infiltrate in the right base cannot be excluded.
Stable cardiac silhouette.

## 2020-05-06 MED ORDER — ALBUTEROL SULFATE (2.5 MG/3ML) 0.083% IN NEBU
2.5000 mg | INHALATION_SOLUTION | Freq: Two times a day (BID) | RESPIRATORY_TRACT | Status: DC
  Administered 2020-05-06 – 2020-05-08 (×5): 2.5 mg via RESPIRATORY_TRACT
  Filled 2020-05-06 (×5): qty 3

## 2020-05-06 MED ORDER — IBUPROFEN 600 MG PO TABS
600.0000 mg | ORAL_TABLET | Freq: Four times a day (QID) | ORAL | Status: DC | PRN
  Administered 2020-05-07: 600 mg via ORAL
  Filled 2020-05-06 (×3): qty 1

## 2020-05-06 MED ORDER — ACETYLCYSTEINE 20 % IN SOLN
4.0000 mL | Freq: Two times a day (BID) | RESPIRATORY_TRACT | Status: DC
  Administered 2020-05-07 – 2020-05-08 (×3): 4 mL via RESPIRATORY_TRACT
  Filled 2020-05-06 (×5): qty 4

## 2020-05-06 MED ORDER — GUAIFENESIN ER 600 MG PO TB12
1200.0000 mg | ORAL_TABLET | Freq: Two times a day (BID) | ORAL | Status: DC
  Administered 2020-05-06 – 2020-05-07 (×3): 1200 mg via ORAL
  Filled 2020-05-06 (×3): qty 2

## 2020-05-06 NOTE — Progress Notes (Signed)
Pt here for a continuous EEG. No distress noted at this time. Will continue to monitor and will use bipap as needed.

## 2020-05-06 NOTE — Progress Notes (Signed)
NAME:  Shannon [name], MRN:  [id number], DOB:  [birth year], LOS: 3 ADMISSION DATE:  05/03/2020, CONSULTATION DATE: 05/04/2020 REFERRING MD: Dr Loney Loh, CHIEF COMPLAINT:  SOB Called for consult at 03:22 Pt evaluated at 03:28  Brief History   74 year old white male with new right lower lobe pneumonia  History of present illness   74 year old white male who presented with right lower lobe pneumonia and known history of  Quadriparesis.  He originally had a C3-4 [place] 4-5 anterior cervical discectomy with decompression, C3 3-4 and C4-5 interbody arthrodesis with local autograft bone, anterior cervical plating of C3-C5 on 04/05/2020.  He then went to rehab and was discharged.  On 04/21/2020 the patient presented to the emergency room with progressive bilateral quadriplegia.  Patient went to the operating room and had decompression of a vertebral hematoma.  And been discharged on 11 10/2019 and readmitted on 05/03/2020 for increasing shortness of breath and productive cough.  Chest x-ray demonstrated a new right lower lobe infiltrate with compressive atelectasis.  Critical care medicine was consulted because of increasing oxygen demand and concern for respiratory pattern.  Currently the patient will nod appropriately to questions has a better than expected cough on demand.  Does not currently appear to be in any distress.  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 rate 19, height 6' (1.829 m), weight 84.7 kg, SpO2 95 %.         Intake/Output Summary (Last 24 hours) at 05/06/2020 0746 Last data filed at 05/06/2020 0344 Gross per 24 hour  Intake 1051.18 ml  Output 2900 ml  Net -1848.82 ml   Filed Weights   05/03/20 2120  Weight: 84.7 kg    Examination: General: Chronically ill appearing frail elderly male lying in bed in NAD HEENT: [place]/AT, MM pink/moist, PERRL, [place] in place  Neuro: Will open eyes to verbal stimuli, attempts to answer question but unable to phonate due to weakness CV: s1s2 regular rate and rhythm, no murmur, rubs, or gallops,  PULM: Tachypnea with shallow respirations but no accessory muscle use seen, no added breath sounds, oxygen saturations 94-95% on 4L [place] GI: soft, bowel sounds active in all 4 quadrants, non-tender, non-distended Extremities: warm/dry, no edema  Skin:decubitus ulcer as below   Assessment & Plan:   Acute respiratory insufficiency  -In the setting quadriplegia post cervical discectomy and epidural hematoma.  Both venous and arterial ABG with no overt hypoxia or hypercapnia High suspicion for aspiration pneumonia -Given dysphagia secondary to quadriplegia Right pleural effusion -Seen on chest x-ray on admission, ultrasound performed in anticipation for thoracentesis with no identified fluid pocket effusion likely consolidated/atelectatic lung P: Has diuresed well with low dose lasix yesterday, hold on any further for now  Encourage frequent and adequate pulmonary hygiene May need CoughAssist to help clear secretions Mobilize as able, encouraged upright position as much as possible Continue empiric antibiotics, de-escalate as able per primary  SLP eval to determine safe diet Close monitoring of respiratory effort and potential respiratory fatigue, he remains at high risk for needing respiratory support including ventilation.  Daughter  indicated 11/12 that patient would want all aggressive measures available.  Sepsis -Secondary to urinary tract infection and  aspiration pneumonia P: Sepsis physiology appears to be resolving Management per primary  Quadriparesis in the setting of surgical interventions x2 per  P: Management per primary Consider neurosurgery consult for reevaluation Supportive care   Rest of acute and chronic conditions managed by primary team  Labs   CBC: Recent Labs  Lab 05/03/20 1243 05/03/20 1321 05/04/20 0001 05/05/20 0158 05/06/20 0444  WBC 19.4*  --  20.5* 16.6* 17.6*  NEUTROABS 16.8*  --   --  14.9* 15.7*  HGB 14.8 22.1* 12.9* 12.6* 13.3  HCT 47.4 65.0* 40.5 39.9 42.3  MCV 92.2  --  89.0 91.3 90.2  PLT 133*  --  117* 127* 152    Basic Metabolic Panel: Recent Labs  Lab 05/03/20 1243 05/03/20 1321 05/04/20 0001 05/05/20 0158 05/06/20 0444  NA 147* 150* 145 150* 151*  K 4.2 4.3 3.8 3.7 3.5  CL 111  --  113* 117* 114*  CO2 27  --  25 26 27   GLUCOSE 158*  --  167* 127* 144*  BUN 20  --  19 19 22   CREATININE 1.15  --  0.82 0.96 1.06  CALCIUM 8.5*  --  8.0* 8.2* 8.2*  MG  --   --   --  2.1 2.1  PHOS  --   --   --   --  3.3   GFR: Estimated Creatinine Clearance: 67.1 mL/min (by C-G formula based on SCr of 1.06 mg/dL). Recent Labs  Lab 05/03/20 1243 05/03/20 1301 05/03/20 1538 05/04/20 0001 05/04/20 0821 05/04/20 1117 05/05/20 0158 05/05/20 1442 05/06/20 0444  WBC 19.4*  --   --  20.5*  --   --  16.6*  --  17.6*  LATICACIDVEN  --    < > 2.5*  --  1.4 2.1*  --  1.8  --    < > = values in this interval not displayed.    Liver Function Tests: Recent Labs  Lab 05/03/20 1243 05/05/20 0158  AST 81* 50*  ALT 157* 89*  ALKPHOS 90 81  BILITOT 1.7* 1.0  PROT 6.2* 5.3*  ALBUMIN 2.2* 1.6*   No results for input(s): LIPASE, AMYLASE in the last 168 hours. No results for input(s): AMMONIA in the last 168 hours.  ABG    Component Value Date/Time   PHART 7.504 (H) 05/03/2020 2303   PCO2ART 34.2 05/03/2020 2303   PO2ART 66.5 (L) 05/03/2020 2303   HCO3 26.9 05/03/2020 2303   TCO2 33 (H)  05/03/2020 1321   O2SAT 94.3 05/03/2020 2303     Coagulation Profile: Recent Labs  Lab 05/03/20 1243  INR 1.7*    Cardiac Enzymes: No results for input(s): CKTOTAL, CKMB, CKMBINDEX, TROPONINI in the last 168 hours.  HbA1C: No results found for: HGBA1C  CBG: Recent Labs  Lab 05/04/20 0755  GLUCAP 115*    Signature 13/10/21, NP-C [facility] Contact / Pager information can be found on Amion  05/06/2020, 7:57 AM  \  .

## 2020-05-06 NOTE — Progress Notes (Signed)
Received patient for care around 1900 from off-going nurse. MAR, chart, and labs reviewed. Patient drowsy, arouses to verbal stimuli, and oriented x3. Re-oriented to time. Patient's oxygen saturation remained above 92% on 4L nasal cannula. Respirations easy with noted accessory muscle use. Skin assessed with no new skin issues noted. Patient turned and repositioned every 2 hours and as needed with 1 assist. Patient incontinent x2. Foley catheter in place and patent. Vitals stable this shift. Call light in reach, bed in low position, wheels locked. No acute events this shift. Will continue to monitor for signs and symptoms of deterioration and report as needed.

## 2020-05-06 NOTE — Progress Notes (Signed)
PROGRESS NOTE    CHOICE KLEINSASSER  WNU:[id number] DOB: 1945/07/22 DOA: 05/03/2020 PCP: [name], MD   Brief Narrative:  HPI: Shannon [name] is a 74 y.o. 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  At the rehab patient sustained a mechanical fall, suffering cervical fracture and cervical epidural hematoma, he was admitted on 10/29 to 04/28/2020 with a working diagnosis of cervical fracture with surgical epidural hematoma, quadriparesis and central cord syndrome.  He underwent revision of his fusion and evacuation of epidural hematoma.  For the last 4 days patient has been not feeling well, with a generalized malaise, cough and congestion.  Per his niece at the bedside he had difficulty swallowing, no recurrent falls.  He does have a pressure wound at his sacrum which has been treated with local wound care. He does have a chronic indwelling Foley catheter.  Today patient was noted to have significant increased work of breathing and tachycardia, no associated pain, no improving or worsening factors.  Due to persistent symptoms he was referred to the hospital for further evaluation.   ED Course:   Patient was ill looking appearing, he had positive leukocytosis and possible pneumonia.  He received 3 L of lactated Ringer's intravenously, antibiotic therapy with ceftriaxone and azithromycin.  He was referred for further hospitalization  Assessment & Plan:   Principal Problem:   Sepsis secondary to UTI [facility]) Active Problems:   Cervical spondylosis with myelopathy and radiculopathy   Quadriparesis (HCC)   Benign essential HTN   Cervical myelopathy (HCC)   Urinary retention   Chronic indwelling Foley catheter   Pressure injury of skin   Protein-calorie malnutrition, severe   1. Severe sepsis due to urinary  tract infection/aspiration pneumonia, present on admission, related to chronic indwelling Foley catheter: Patient meets sepsis criteria due to fever, tachycardia, tachypnea and leukocytosis and severe sepsis due to hypoxia and lactic acid of 2.7. Currently requiring about 5 L of oxygen. PCCM on board. Aspiration pneumonia secondary to dysphagia due to recent cervical vertebrae surgery. Seen by SLP. Moderate aspiration risk, remains on clear liquids. until further recommendations. SLP to assess every day.  Lactic acidosis resolved.  Continue cefepime, Flagyl and azithromycin. Pulmonary toilet. PCCM tried thoracentesis on 05/05/2020 but he had consolidated/atelectatic lung and no fluid.  Patient remains at very high risk of intubation.  He cannot control his secretions.  Discussed with daughter by myself and PCCM and she prefers to continue full code.  Urine culture growing Klebsiella pneumonia, mostly sensitive.  Continue cefepime, Flagyl and azithromycin.  Continues to require 4 L of oxygen.  2.  Hypernatremia: Mild.  Monitor.  3.  Quadriplegia due to recent cervical vertebrae fracture.  Continue aspiration and fall precautions. PT/OT/SLP on board.  4.  Stage I-II sacrum decubitus ulcer.  Present on admission, continue local wound care. Consult wound care team.   5. HTN. Hold on amlodipine due to risk of hypotension as blood pressure remains on low normal side, continue close blood pressure monitoring.   6. BPH. Continue with tamsulosin. Foley in place.   7. Thrombocytopenia: Likely sepsis induced.  Resolved.  8.  Severe protein calorie malnutrition: We will consult dietitian.  DVT prophylaxis: SCDs Start: 05/03/20 1507   Code Status: Full Code  Family Communication: None at bedside.  Discussed with daughter yesterday.  Status is: Inpatient  Remains inpatient appropriate because:Hemodynamically unstable  Dispo: The patient is from: SNF              Anticipated d/c is to: SNF               Anticipated d/c date is: > 3 days              Patient currently is not medically stable to d/c.        Estimated body mass index is 25.33 kg/m as calculated from the following:   Height as of this encounter: 6' (1.829 m).   Weight as of this encounter: 84.7 kg.  Pressure Injury 05/03/20 Sacrum Right;Left;Medial Stage 2 -  Partial thickness loss of dermis presenting as a shallow open injury with a red, pink wound bed without slough. (Active)  05/03/20 2023  Location: Sacrum  Location Orientation: Right;Left;Medial  Staging: Stage 2 -  Partial thickness loss of dermis presenting as a shallow open injury with a red, pink wound bed without slough.  Wound Description (Comments):   Present on Admission: Yes     Nutritional status:  Nutrition Problem: Severe Malnutrition Etiology: chronic illness   Signs/Symptoms: moderate fat depletion, severe muscle depletion, percent weight loss (17.4% weight loss in less than 4 months) Percent weight loss: 17.4 %   Interventions: Magic cup, Hormel Shake    Consultants:   PCCM  Procedures:   None  Antimicrobials:  Anti-infectives (From admission, onward)   Start     Dose/Rate Route Frequency Ordered Stop   05/04/20 1800  cefTRIAXone (ROCEPHIN) 2 g in sodium chloride 0.9 % 100 mL IVPB  Status:  Discontinued        2 g 200 mL/hr over 30 Minutes Intravenous Every 24 hours 05/03/20 1905 05/04/20 0814   05/04/20 1300  azithromycin (ZITHROMAX) 500 mg in sodium chloride 0.9 % 250 mL IVPB        500 mg 250 mL/hr over 60 Minutes Intravenous Every 24 hours 05/03/20 2348     05/04/20 1000  ceFEPIme (MAXIPIME) 2 g in sodium chloride 0.9 % 100 mL IVPB        2 g 200 mL/hr over 30 Minutes Intravenous Every 8 hours 05/04/20 0832     05/04/20 1000  metroNIDAZOLE (FLAGYL) tablet 500 mg        500 mg Oral Every 8 hours 05/04/20 0832     05/03/20 1800  cefTRIAXone (ROCEPHIN) 1 g in sodium chloride 0.9 % 100 mL IVPB  Status:  Discontinued         1 g 200 mL/hr over 30 Minutes Intravenous Every 24 hours 05/03/20 1754 05/03/20 1905   05/03/20 1300  cefTRIAXone (ROCEPHIN) 1 g in sodium chloride 0.9 % 100 mL IVPB  Status:  Discontinued        1 g 200 mL/hr over 30 Minutes Intravenous Every 24 hours 05/03/20 1250 05/03/20 1905   05/03/20 1300  azithromycin (ZITHROMAX) 500 mg in sodium chloride 0.9 % 250 mL IVPB  Status:  Discontinued        500 mg 250 mL/hr over 60 Minutes Intravenous Every 24 hours 05/03/20 1250 05/03/20 1754   05/03/20 1245  levofloxacin (LEVAQUIN) IVPB 750 mg  Status:  Discontinued        750 mg 100 mL/hr over 90 Minutes Intravenous  Once 05/03/20 1243 05/03/20 1250         Subjective: Seen and examined.  Able to speak little bit with husky voice.  Denies having any pain or shortness of breath or  any other complaint.  Objective: Vitals:   05/05/20 1953 05/05/20 2345 05/06/20 0341 05/06/20 0745  BP: 107/61 103/74 116/68 127/75  Pulse: (!) 115 (!) 109 (!) 106 (!) 107  Resp: Temp: 100.2 F (37.9 C) 99.3 F (37.4 C) 99.2 F (37.3 C) 100 F (37.8 C)  TempSrc: Oral Axillary Axillary   SpO2: 94% 95% 94% 95%  Weight:      Height:        Intake/Output Summary (Last 24 hours) at 05/06/2020 1042 Last data filed at 05/06/2020 0344 Gross per 24 hour  Intake --  Output 2900 ml  Net -2900 ml   Filed Weights   05/03/20 2120  Weight: 84.7 kg    Examination:  General exam: Appears calm and comfortable, neck collar in place Respiratory system: Diminished breath sounds bilaterally. Respiratory effort normal. Cardiovascular system: S1 & S2 heard, RRR. No JVD, murmurs, rubs, gallops or clicks. No pedal edema. Gastrointestinal system: Abdomen is nondistended, soft and nontender. No organomegaly or masses felt. Normal bowel sounds heard. Central nervous system: Slight lethargic and oriented x3.  1/5 power in all 4 extremities. Extremities: Symmetric 5 x 5 power. Skin: No rashes, lesions or  ulcers.    Data Reviewed: I have personally reviewed following labs and imaging studies  CBC: Recent Labs  Lab 05/03/20 1243 05/03/20 1321 05/04/20 0001 05/05/20 0158 05/06/20 0444  WBC 19.4*  --  20.5* 16.6* 17.6*  NEUTROABS 16.8*  --   --  14.9* 15.7*  HGB 14.8 22.1* 12.9* 12.6* 13.3  HCT 47.4 65.0* 40.5 39.9 42.3  MCV 92.2  --  89.0 91.3 90.2  PLT 133*  --  117* 127* 152   Basic Metabolic Panel: Recent Labs  Lab 05/03/20 1243 05/03/20 1321 05/04/20 0001 05/05/20 0158 05/06/20 0444  NA 147* 150* 145 150* 151*  K 4.2 4.3 3.8 3.7 3.5  CL 111  --  113* 117* 114*  CO2 27  --  GLUCOSE 158*  --  167* 127* 144*  BUN 20  --  CREATININE 1.15  --  0.82 0.96 1.06  CALCIUM 8.5*  --  8.0* 8.2* 8.2*  MG  --   --   --  2.1 2.1  PHOS  --   --   --   --  3.3   GFR: Estimated Creatinine Clearance: 67.1 mL/min (by C-G formula based on SCr of 1.06 mg/dL). Liver Function Tests: Recent Labs  Lab 05/03/20 1243 05/05/20 0158  AST 81* 50*  ALT 157* 89*  ALKPHOS 90 81  BILITOT 1.7* 1.0  PROT 6.2* 5.3*  ALBUMIN 2.2* 1.6*   No results for input(s): LIPASE, AMYLASE in the last 168 hours. No results for input(s): AMMONIA in the last 168 hours. Coagulation Profile: Recent Labs  Lab 05/03/20 1243  INR 1.7*   Cardiac Enzymes: No results for input(s): CKTOTAL, CKMB, CKMBINDEX, TROPONINI in the last 168 hours. BNP (last 3 results) No results for input(s): PROBNP in the last 8760 hours. HbA1C: No results for input(s): HGBA1C in the last 72 hours. CBG: Recent Labs  Lab 05/04/20 0755  GLUCAP 115*   Lipid Profile: No results for input(s): CHOL, HDL, LDLCALC, TRIG, CHOLHDL, LDLDIRECT in the last 72 hours. Thyroid Function Tests: No results for input(s): TSH, T4TOTAL, FREET4, T3FREE, THYROIDAB in the last 72 hours. Anemia Panel: No results for input(s): VITAMINB12, FOLATE, FERRITIN, TIBC, IRON, RETICCTPCT in the last 72 hours. Sepsis Labs:  Recent Labs   Lab 05/03/20 1538 05/04/20 0821 05/04/20 1117 05/05/20 1442  LATICACIDVEN 2.5* 1.4 2.1* 1.8    Recent Results (from the past 240 hour(s))  SARS Coronavirus 2 by RT PCR (hospital order, performed in [facility] hospital lab) Nasopharyngeal Nasopharyngeal Swab     Status: None   Collection Time: 04/27/20  2:35 PM   Specimen: Nasopharyngeal Swab  Result Value Ref Range Status   SARS Coronavirus 2 NEGATIVE NEGATIVE Final    Comment: (NOTE) SARS-CoV-2 target nucleic acids are NOT DETECTED.  The SARS-CoV-2 RNA is generally detectable in upper and lower respiratory specimens during the acute phase of infection. The lowest concentration of SARS-CoV-2 viral copies this assay can detect is 250 copies / mL. A negative result does not preclude SARS-CoV-2 infection and should not be used as the sole basis for treatment or other patient management decisions.  A negative result may occur with improper specimen collection / handling, submission of specimen other than nasopharyngeal swab, presence of viral mutation(s) within the areas targeted by this assay, and inadequate number of viral copies (<250 copies / mL). A negative result must be combined with clinical observations, patient history, and epidemiological information.  Fact Sheet for Patients:   BoilerBrush.com.cy  Fact Sheet for Healthcare Providers: https://pope.com/  This test is not yet approved or  cleared by the [place] FDA and has been authorized for detection and/or diagnosis of SARS-CoV-2 by FDA under an Emergency Use Authorization (EUA).  This EUA will remain in effect (meaning this test can be used) for the duration of the COVID-19 declaration under Section 564(b)(1) of the Act, 21 U.S.C. section 360bbb-3(b)(1), unless the authorization is terminated or revoked sooner.  Performed at [facility], [address] [place], [place] [address]   Urine culture      Status: Abnormal   Collection Time: 05/03/20 12:43 PM   Specimen: In/Out Cath Urine  Result Value Ref Range Status   Specimen Description IN/OUT CATH URINE  Final   Special Requests   Final    NONE Performed at [facility], [address], [place], [place] [address]    Culture (A)  Final    >=100,000 COLONIES/mL KLEBSIELLA PNEUMONIAE >=100,000 COLONIES/mL ESCHERICHIA COLI    Report Status 05/06/2020 FINAL  Final   Organism ID, Bacteria KLEBSIELLA PNEUMONIAE (A)  Final   Organism ID, Bacteria ESCHERICHIA COLI (A)  Final      Susceptibility   Escherichia coli - MIC*    AMPICILLIN <=2 SENSITIVE Sensitive     CEFAZOLIN <=4 SENSITIVE Sensitive     CEFEPIME <=0.12 SENSITIVE Sensitive     CEFTRIAXONE <=0.25 SENSITIVE Sensitive     CIPROFLOXACIN <=0.25 SENSITIVE Sensitive     GENTAMICIN <=1 SENSITIVE Sensitive     IMIPENEM <=0.25 SENSITIVE Sensitive     NITROFURANTOIN <=16 SENSITIVE Sensitive     TRIMETH/SULFA <=20 SENSITIVE Sensitive     AMPICILLIN/SULBACTAM <=2 SENSITIVE Sensitive     PIP/TAZO <=4 SENSITIVE Sensitive     * >=100,000 COLONIES/mL ESCHERICHIA COLI   Klebsiella pneumoniae - MIC*    AMPICILLIN >=32 RESISTANT Resistant     CEFAZOLIN <=4 SENSITIVE Sensitive     CEFEPIME <=0.12 SENSITIVE Sensitive     CEFTRIAXONE <=0.25 SENSITIVE Sensitive     CIPROFLOXACIN <=0.25 SENSITIVE Sensitive     GENTAMICIN <=1 SENSITIVE Sensitive     IMIPENEM <=0.25 SENSITIVE Sensitive     NITROFURANTOIN 64 INTERMEDIATE Intermediate     TRIMETH/SULFA <=20 SENSITIVE Sensitive  AMPICILLIN/SULBACTAM 4 SENSITIVE Sensitive     PIP/TAZO <=4 SENSITIVE Sensitive     * >=100,000 COLONIES/mL KLEBSIELLA PNEUMONIAE  Blood Culture (routine x 2)     Status: None (Preliminary result)   Collection Time: 05/03/20  1:00 PM   Specimen: BLOOD  Result Value Ref Range Status   Specimen Description BLOOD SITE NOT SPECIFIED  Final   Special Requests   Final    BOTTLES DRAWN AEROBIC AND ANAEROBIC Blood  Culture results may not be optimal due to an inadequate volume of blood received in culture bottles   Culture   Final    NO GROWTH 3 DAYS Performed at [facility], [address], [place], [place] [address]    Report Status PENDING  Incomplete  Blood Culture (routine x 2)     Status: None (Preliminary result)   Collection Time: 05/03/20  1:16 PM   Specimen: BLOOD  Result Value Ref Range Status   Specimen Description BLOOD SITE NOT SPECIFIED  Final   Special Requests   Final    BOTTLES DRAWN AEROBIC AND ANAEROBIC Blood Culture results may not be optimal due to an inadequate volume of blood received in culture bottles   Culture   Final    NO GROWTH 3 DAYS Performed at [facility], [address], [place], [place] [address]    Report Status PENDING  Incomplete  Respiratory Panel by RT PCR (Flu A&B, Covid) - Nasopharyngeal Swab     Status: None   Collection Time: 05/03/20 11:38 PM   Specimen: Nasopharyngeal Swab  Result Value Ref Range Status   SARS Coronavirus 2 by RT PCR NEGATIVE NEGATIVE Final    Comment: (NOTE) SARS-CoV-2 target nucleic acids are NOT DETECTED.  The SARS-CoV-2 RNA is generally detectable in upper respiratoy specimens during the acute phase of infection. The lowest concentration of SARS-CoV-2 viral copies this assay can detect is 131 copies/mL. A negative result does not preclude SARS-Cov-2 infection and should not be used as the sole basis for treatment or other patient management decisions. A negative result may occur with  improper specimen collection/handling, submission of specimen other than nasopharyngeal swab, presence of viral mutation(s) within the areas targeted by this assay, and inadequate number of viral copies (<131 copies/mL). A negative result must be combined with clinical observations, patient history, and epidemiological information. The expected result is Negative.  Fact Sheet for Patients:   https://www.moore.com/  Fact Sheet for Healthcare Providers:  https://www.young.biz/  This test is no t yet approved or cleared by the [place] FDA and  has been authorized for detection and/or diagnosis of SARS-CoV-2 by FDA under an Emergency Use Authorization (EUA). This EUA will remain  in effect (meaning this test can be used) for the duration of the COVID-19 declaration under Section 564(b)(1) of the Act, 21 U.S.C. section 360bbb-3(b)(1), unless the authorization is terminated or revoked sooner.     Influenza A by PCR NEGATIVE NEGATIVE Final   Influenza B by PCR NEGATIVE NEGATIVE Final    Comment: (NOTE) The Xpert Xpress SARS-CoV-2/FLU/RSV assay is intended as an aid in  the diagnosis of influenza from Nasopharyngeal swab specimens and  should not be used as a sole basis for treatment. Nasal washings and  aspirates are unacceptable for Xpert Xpress SARS-CoV-2/FLU/RSV  testing.  Fact Sheet for Patients: https://www.moore.com/  Fact Sheet for Healthcare Providers: https://www.young.biz/  This test is not yet approved or cleared by the [place] and  has been authorized for  detection and/or diagnosis of SARS-CoV-2 by  FDA under an Emergency Use Authorization (EUA). This EUA will remain  in effect (meaning this test can be used) for the duration of the  Covid-19 declaration under Section 564(b)(1) of the Act, 21  U.S.C. section 360bbb-3(b)(1), unless the authorization is  terminated or revoked. Performed at [facility], [address] [place], [place] [address]       Radiology Studies: DG Chest Port 1 View  Result Date: 05/06/2020 CLINICAL DATA:  Shortness of breath EXAM: PORTABLE CHEST 1 VIEW COMPARISON:  May 05, 2020 FINDINGS: Persistent right pleural effusion with areas of atelectatic change in the lung bases noted. Heart size and pulmonary vascularity are normal.  No adenopathy. No bone lesions. IMPRESSION: Stable right pleural effusion with bibasilar atelectasis. A degree of superimposed infiltrate in the right base cannot be excluded. Stable cardiac silhouette. Electronically Signed   By: Bretta Bang III M.D.   On: 05/06/2020 08:29   DG Chest Port 1 View  Result Date: 05/05/2020 CLINICAL DATA:  Respiratory distress. EXAM: PORTABLE CHEST 1 VIEW COMPARISON:  05/03/2020 FINDINGS: The cardiac silhouette, mediastinal and hilar contours are within normal limits and stable. Moderate-sized right pleural effusion with overlying atelectasis or infiltrate. Patchy left basilar infiltrate. IMPRESSION: Moderate-sized right pleural effusion with overlying atelectasis or infiltrate. Patchy left basilar infiltrate. Electronically Signed   By: Rudie Meyer M.D.   On: 05/05/2020 07:15   DG Swallowing Func-Speech Pathology  Result Date: 05/04/2020 Objective Swallowing Evaluation: Type of Study: Bedside Swallow Evaluation  Patient Details Name: CAIGE ALMEDA MRN: [id number] Date of Birth: 01-29-1946 Today's Date: 05/04/2020 Time: SLP Start Time (ACUTE ONLY): 1044 -SLP Stop Time (ACUTE ONLY): 1103 SLP Time Calculation (min) (ACUTE ONLY): 19 min Past Medical History: Past Medical History: Diagnosis Date . Asthma  . 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 Newell Coral, 1992-->surg at [facility].  Summer 2021->Cervicalgia,arm pain, hand atrophy RUE wkness, hyperreflex (Dr. 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 Hay fever  . Hypertension  . Osteoarthritis of both hands  Past Surgical History: Past Surgical History: Procedure Laterality Date . ANTERIOR CERVICAL DECOMP/DISCECTOMY FUSION N/A 04/05/2020  Procedure: ANTERIOR CERVICAL DECOMPRESSION/DISCECTOMY FUSION, INTERBODY PROSTHESIS, PLATE/SCREWS CERVICAL THREE-CERVICAL FOUR, CERVICAL FOUR- CERVICAL  FIVE;  Surgeon: Tressie Stalker, MD;  Location: [name] OR;  Service: Neurosurgery;  Laterality: N/A; . ANTERIOR CERVICAL DECOMP/DISCECTOMY FUSION N/A 04/22/2020  Procedure: Reexploration of anterior cervical wound for epidural hematoma;  Surgeon: Donalee Citrin, MD;  Location: [facility] OR;  Service: Neurosurgery;  Laterality: N/A; . BACK SURGERY   . INCISION AND DRAINAGE Left 2012  L hand infection . ROTATOR CUFF REPAIR Right   x 3 . SPINE SURGERY   HPI: 74 year old white male who presented with right lower lobe pneumonia and known history of  Quadriparesis.  He originally had a C3-4 [place] 4-5 anterior cervical disc asked to me with decompression, C3 3-4 and C4-5 interbody arthrodesis with local autograft bone, anterior cervical plating of C3-C5 on 04/05/2020.  He then went to rehab and was discharged.  On 04/21/2020 the patient presented to the emergency room with progressive bilateral quadriplegia.  Patient went to the operating room and had decompression of a vertebral hematoma.  And been discharged on 11 10/2019 and readmitted on 05/03/2020 for increasing shortness of breath and productive cough.  Chest x-ray demonstrated a new right lower lobe infiltrate with compressive  atelectasis, concerning for aspiration pneumonia.  Pt had MBSS during most recent admission on 04/25/2020 with recommendations for puree and thin liquids, advanced to Dys3 prior to discharge  Subjective: alert, cooperative, pleasant, participative. Assessment / Plan / Recommendation CHL IP CLINICAL IMPRESSIONS 05/04/2020 Clinical Impression Pt presents with moderate oropharyngeal dysphagia c/b decreased base of tongue retraction, delayed swallow inititation, incomplete laryngeal closure, reduced pharyngeal perstalsis, decreased UES opening, and diminished sensation. These deficits resulted in silent aspiration of thin liquid and nectar thick liquid by straw prior to the swallow. With nectar thick liquid by cup, only transient penetration was seen. There was  significant pharyngeal residue with puree and solid consistencies which was reduced but not fully cleared with liquid wash. There is noticeable prevertebral edema in pharynx which appears to be impacting swallow function, but pt is able to acheive adequate epiglottic deflection. This study represents a marked decline in swallow function from MBSS on 04/25/20; however, from review of images in both studies, amount of edema appears comparable to naked eye without any accurate measurement, or comparing level of magnification during each study. Because of recent ACDF (11/13) and revision (10/29), and presence of cervical collar, SLP intervention options are limited outside of modification of diet. Pt cannot trial compensatory strategies requiring change to head/neck positioning, and many swallow exercises cannot be executed in this position. Pt may benefit from some tongue strengthening exercises to improve swallow drive and decrease vallecula residue. Recommend initiating full liquid diet with nectar thick liquids, by cup sip only. NO STRAWS. Please crush medications if permissible. Called neice, [name], and shared results of today's study which she will pass on to pt's daughter who will be coming to town tomorrow. SLP Visit Diagnosis -- Attention and concentration deficit following -- Frontal lobe and executive function deficit following -- Impact on safety and function --   CHL IP TREATMENT RECOMMENDATION 05/04/2020 Treatment Recommendations Therapy as outlined in treatment plan below   Prognosis 05/04/2020 Prognosis for Safe Diet Advancement Fair Barriers to Reach Goals Cognitive deficits Barriers/Prognosis Comment -- CHL IP DIET RECOMMENDATION 05/04/2020 SLP Diet Recommendations Nectar thick liquid Liquid Administration via No straw Medication Administration -- Compensations Slow rate;Small sips/bites;Follow solids with liquid Postural Changes Seated upright at 90 degrees;Remain semi-upright after after feeds/meals  (Comment)   CHL IP OTHER RECOMMENDATIONS 05/04/2020 Recommended Consults -- Oral Care Recommendations Oral care BID Other Recommendations --   CHL IP FOLLOW UP RECOMMENDATIONS 05/04/2020 Follow up Recommendations (No Data)   CHL IP FREQUENCY AND DURATION 05/04/2020 Speech Therapy Frequency (ACUTE ONLY) min 2x/week Treatment Duration 2 weeks      CHL IP ORAL PHASE 05/04/2020 Oral Phase -- Oral - Pudding Teaspoon -- Oral - Pudding Cup -- Oral - Honey Teaspoon -- Oral - Honey Cup -- Oral - Nectar Teaspoon -- Oral - Nectar Cup WFL Oral - Nectar Straw Premature spillage;Decreased bolus cohesion Oral - Thin Teaspoon -- Oral - Thin Cup Premature spillage Oral - Thin Straw Premature spillage Oral - Puree WFL Oral - Mech Soft WFL Oral - Regular -- Oral - Multi-Consistency -- Oral - Pill WFL Oral Phase - Comment --  CHL IP PHARYNGEAL PHASE 05/04/2020 Pharyngeal Phase Impaired Pharyngeal- Pudding Teaspoon -- Pharyngeal -- Pharyngeal- Pudding Cup -- Pharyngeal -- Pharyngeal- Honey Teaspoon -- Pharyngeal -- Pharyngeal- Honey Cup -- Pharyngeal -- Pharyngeal- Nectar Teaspoon -- Pharyngeal -- Pharyngeal- Nectar Cup -- Pharyngeal -- Pharyngeal- Nectar Straw -- Pharyngeal -- Pharyngeal- Thin Teaspoon -- Pharyngeal -- Pharyngeal- Thin Cup -- Pharyngeal -- Pharyngeal- Thin Straw --  Pharyngeal -- Pharyngeal- Puree -- Pharyngeal -- Pharyngeal- Mechanical Soft -- Pharyngeal -- Pharyngeal- Regular -- Pharyngeal -- Pharyngeal- Multi-consistency -- Pharyngeal -- Pharyngeal- Pill -- Pharyngeal -- Pharyngeal Comment --  CHL IP CERVICAL ESOPHAGEAL PHASE 05/04/2020 Cervical Esophageal Phase Impaired Pudding Teaspoon -- Pudding Cup -- Honey Teaspoon -- Honey Cup -- Nectar Teaspoon -- Nectar Cup Reduced cricopharyngeal relaxation Nectar Straw Reduced cricopharyngeal relaxation Thin Teaspoon -- Thin Cup Reduced cricopharyngeal relaxation Thin Straw Reduced cricopharyngeal relaxation Puree Reduced cricopharyngeal relaxation Mechanical Soft Reduced  cricopharyngeal relaxation Regular -- Multi-consistency -- Pill Reduced cricopharyngeal relaxation Cervical Esophageal Comment trace esophageal retention of contrast Kerrie Pleasure, MA, CCC-SLP Acute Rehabilitation Services Office: [phone] 05/04/2020, 11:59 AM               Scheduled Meds: . acetylcysteine  4 mL Nebulization BID  . albuterol  2.5 mg Nebulization BID  . Chlorhexidine Gluconate Cloth  6 each Topical Daily  . guaiFENesin  1,200 mg Oral BID  . metroNIDAZOLE  500 mg Oral Q8H  . pantoprazole (PROTONIX) IV  40 mg Intravenous Q24H  . polyethylene glycol  17 g Oral Daily  . tamsulosin  0.4 mg Oral Daily   Continuous Infusions: . azithromycin 20 mL/hr at 05/04/20 1635  . ceFEPime (MAXIPIME) IV 2 g (05/06/20 0920)     LOS: 3 days   Time spent: 28 minutes  Hughie Closs, MD [facility]  05/06/2020, 10:42 AM   To contact the attending provider between 7A-7P or the covering provider during after hours 7P-7A, please log into the web site [email or web address].  Addendum: I was able to speak to his daughter.  I did explain to her the poor prognosis that her father has.  I reconfirmed with her and she wants to keep him full code and would like for him to be intubated if need be.

## 2020-05-07 DIAGNOSIS — A419 Sepsis, unspecified organism: Secondary | ICD-10-CM | POA: Diagnosis not present

## 2020-05-07 DIAGNOSIS — N390 Urinary tract infection, site not specified: Secondary | ICD-10-CM | POA: Diagnosis not present

## 2020-05-07 LAB — CBC WITH DIFFERENTIAL/PLATELET
Abs Immature Granulocytes: 0.14 10*3/uL — ABNORMAL HIGH (ref 0.00–0.07)
Basophils Absolute: 0 10*3/uL (ref 0.0–0.1)
Basophils Relative: 0 %
Eosinophils Absolute: 0 10*3/uL (ref 0.0–0.5)
Eosinophils Relative: 0 %
HCT: 42 % (ref 39.0–52.0)
Hemoglobin: 13.1 g/dL (ref 13.0–17.0)
Immature Granulocytes: 1 %
Lymphocytes Relative: 5 %
Lymphs Abs: 0.9 10*3/uL (ref 0.7–4.0)
MCH: 28.5 pg (ref 26.0–34.0)
MCHC: 31.2 g/dL (ref 30.0–36.0)
MCV: 91.5 fL (ref 80.0–100.0)
Monocytes Absolute: 0.6 10*3/uL (ref 0.1–1.0)
Monocytes Relative: 4 %
Neutro Abs: 14.4 10*3/uL — ABNORMAL HIGH (ref 1.7–7.7)
Neutrophils Relative %: 90 %
Platelets: 146 10*3/uL — ABNORMAL LOW (ref 150–400)
RBC: 4.59 MIL/uL (ref 4.22–5.81)
RDW: 14.5 % (ref 11.5–15.5)
WBC: 16 10*3/uL — ABNORMAL HIGH (ref 4.0–10.5)
nRBC: 0 % (ref 0.0–0.2)

## 2020-05-07 LAB — BASIC METABOLIC PANEL
Anion gap: 13 (ref 5–15)
BUN: 27 mg/dL — ABNORMAL HIGH (ref 8–23)
CO2: 28 mmol/L (ref 22–32)
Calcium: 8.5 mg/dL — ABNORMAL LOW (ref 8.9–10.3)
Chloride: 117 mmol/L — ABNORMAL HIGH (ref 98–111)
Creatinine, Ser: 1.1 mg/dL (ref 0.61–1.24)
GFR, Estimated: >60 mL/min (ref >60)
Glucose, Bld: 139 mg/dL — ABNORMAL HIGH (ref 70–99)
Potassium: 3.7 mmol/L (ref 3.5–5.1)
Sodium: 158 mmol/L — ABNORMAL HIGH (ref 135–145)

## 2020-05-07 LAB — SODIUM
Sodium: 156 mmol/L — ABNORMAL HIGH (ref 135–145)
Sodium: 150 mmol/L — ABNORMAL HIGH (ref 135–145)

## 2020-05-07 LAB — MAGNESIUM: Magnesium: 2.2 mg/dL (ref 1.7–2.4)

## 2020-05-07 MED ORDER — POTASSIUM CL IN DEXTROSE 5% 20 MEQ/L IV SOLN
20.0000 meq | INTRAVENOUS | Status: DC
  Administered 2020-05-07 (×2): 20 meq via INTRAVENOUS
  Filled 2020-05-07 (×3): qty 1000

## 2020-05-07 MED ORDER — GUAIFENESIN 100 MG/5ML PO SOLN
20.0000 mL | Freq: Four times a day (QID) | ORAL | Status: DC
  Administered 2020-05-07 – 2020-05-11 (×12): 400 mg via ORAL
  Filled 2020-05-07 (×2): qty 20
  Filled 2020-05-07 (×3): qty 5
  Filled 2020-05-07: qty 15
  Filled 2020-05-07: qty 5
  Filled 2020-05-07 (×4): qty 20
  Filled 2020-05-07: qty 5
  Filled 2020-05-07: qty 20

## 2020-05-07 MED ORDER — FOOD THICKENER (SIMPLYTHICK)
2.0000 | ORAL | Status: DC | PRN
  Filled 2020-05-07 (×2): qty 2

## 2020-05-07 NOTE — Progress Notes (Signed)
Nutrition Follow-up  DOCUMENTATION CODES:   Severe malnutrition in context of chronic illness  INTERVENTION:  -Continue Vital Cuisine Shake TID with meals, each supplement provides 520 kcal and 22 grams of protein  -Continue Magic Cup TID with meals, each supplement provides 290 kcal and 9 grams of protein   NUTRITION DIAGNOSIS:   Severe Malnutrition related to chronic illness as evidenced by moderate fat depletion, severe muscle depletion, percent weight loss (17.4% weight loss in less than 4 months).  ongoing  GOAL:   Patient will meet greater than or equal to 90% of their needs  progressing  MONITOR:   PO intake, Supplement acceptance, Diet advancement, Labs, Weight trends, Skin  REASON FOR ASSESSMENT:   Consult Assessment of nutrition requirement/status  ASSESSMENT:   74 year old male who presented to the ED from SNF on 11/10 with dyspnea. 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 Pt admitted with sepsis due to UTI, right lower lobe pneumonia.  11/11 - MBS, diet advanced to nectar-thick full liquids  RD consulted to assess pt's nutrition requirement/status. Note pt was already assessed on 11/11 and diagnosed with severe malnutrition. Pt was started on oral nutrition supplements at that time (Vital Cuisine shake and Magic Cup). RN unsure how pt has done with these supplements, but does state pt is doing well and consumed ~7oz of nectar thick Ensure this morning. Will continue with current nutrition plan of care as Ensure is difficult to thicken, so pre-thickened supplements are preferred. SLP is following pt. If pt's diet is unable to be advanced, recommend consideration of Cortrak placement to supplement po intake.   PO Intake: 55% x 1 recorded meal  UOP: documented x24 hours  Labs: Na 158 (H, trending up) Medications: protonix, miralax IVF: D5 w/ KCl 69mEq/L @ 49ml/hr  Diet Order:    Diet Order            Diet full liquid Room service appropriate? Yes; Fluid consistency: Nectar Thick  Diet effective now                 EDUCATION NEEDS:   Not appropriate for education at this time  Skin:  Skin Assessment: Skin Integrity Issues: Skin Integrity Issues:: Stage II, Incisions Stage II: bilateral sacrum Incisions: neck  Last BM:  11/12  Height:   Ht Readings from Last 1 Encounters:  05/03/20 6' (1.829 m)    Weight:   Wt Readings from Last 1 Encounters:  05/03/20 84.7 kg    BMI:  Body mass index is 25.33 kg/m.  Estimated Nutritional Needs:   Kcal:  2000-2200  Protein:  110-130 grams  Fluid:  >/= 2.0 L    Eugene Gavia, MS, RD, LDN RD pager number and weekend/on-call pager number located in [place].

## 2020-05-07 NOTE — Progress Notes (Signed)
PROGRESS NOTE    Shannon [name]  ZOX:[id number] DOB: 1946-03-13 DOA: 05/03/2020 PCP: Jeoffrey MassedMcGowen, Philip H, MD   Brief Narrative:  Shannon [name] is a 74 y.o. 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  At the rehab patient sustained a mechanical fall, suffering cervical fracture and cervical epidural hematoma, he was admitted on 10/29 to 04/28/2020 with a working diagnosis of cervical fracture with surgical epidural hematoma, quadriparesis and central cord syndrome.  He underwent revision of his fusion and evacuation of epidural hematoma.  Patient was readmitted with generalized malaise, cough and congestion and was diagnosed with severe sepsis and acute hypoxic respiratory failure secondary to aspiration pneumonia as well as UTI.  PCCM on board.  Assessment & Plan:   Principal Problem:   Sepsis secondary to UTI [facility](HCC) Active Problems:   Cervical spondylosis with myelopathy and radiculopathy   Quadriparesis (HCC)   Benign essential HTN   Cervical myelopathy (HCC)   Urinary retention   Chronic indwelling Foley catheter   Pressure injury of skin   Protein-calorie malnutrition, severe   1. Severe sepsis due to urinary tract infection/aspiration pneumonia, present on admission, related to chronic indwelling Foley catheter: Patient meets sepsis criteria due to fever, tachycardia, tachypnea and leukocytosis and severe sepsis due to hypoxia and lactic acid of 2.7. Currently requiring about 5 L of oxygen. PCCM on board. Aspiration pneumonia secondary to dysphagia due to recent cervical vertebrae surgery. Seen by SLP. Moderate aspiration risk, remains on clear liquids. until further recommendations.  Lactic acidosis resolved. PCCM tried thoracentesis on 05/05/2020 but he had consolidated/atelectatic lung and no fluid.  Patient  remains at very high risk of intubation.  He cannot control his secretions.  Discussed with daughter by myself and PCCM and she prefers to continue full code.  Urine culture growing Klebsiella pneumonia, mostly sensitive.  Continue cefepime, Flagyl and azithromycin.  He is much more alert today but still requiring 4 L oxygen.  SLP has not seen patient for last 3 days.  Sent a message to the nurse to reach out to SLP to assess patient so we can advance his diet.  2.  Hypernatremia: Jumped to 158.  Will start on dextrose water and check sodium every 8 hours.  3.  Quadriplegia due to recent cervical vertebrae fracture.  Continue aspiration and fall precautions. PT/OT/SLP on board.  4.  Stage I-II sacrum decubitus ulcer.  Present on admission, continue local wound care. Consult wound care team.   5. HTN. Hold on amlodipine due to risk of hypotension as blood pressure remains on low normal side, continue close blood pressure monitoring.   6. BPH. Continue with tamsulosin. Foley in place.   7. Thrombocytopenia: Likely sepsis induced.  Resolved.  8.  Severe protein calorie malnutrition: Dietitian on board.  DVT prophylaxis: SCDs Start: 05/03/20 1507   Code Status: Full Code  Family Communication: None at bedside.  Discussed with daughter day before yesterday.  Status is: Inpatient  Remains inpatient appropriate because:Hemodynamically unstable   Dispo: The patient is from: SNF              Anticipated d/c is to: SNF              Anticipated d/c date is: > 3 days              Patient currently is not medically stable to d/c.  Estimated body mass index is 25.33 kg/m as calculated from the following:   Height as of this encounter: 6' (1.829 m).   Weight as of this encounter: 84.7 kg.  Pressure Injury 05/03/20 Sacrum Right;Left;Medial Stage 2 -  Partial thickness loss of dermis presenting as a shallow open injury with a red, pink wound bed without slough. (Active)  05/03/20  2023  Location: Sacrum  Location Orientation: Right;Left;Medial  Staging: Stage 2 -  Partial thickness loss of dermis presenting as a shallow open injury with a red, pink wound bed without slough.  Wound Description (Comments):   Present on Admission: Yes     Nutritional status:  Nutrition Problem: Severe Malnutrition Etiology: chronic illness   Signs/Symptoms: moderate fat depletion, severe muscle depletion, percent weight loss (17.4% weight loss in less than 4 months) Percent weight loss: 17.4 %   Interventions: Magic cup, Hormel Shake    Consultants:   PCCM  Procedures:   None  Antimicrobials:  Anti-infectives (From admission, onward)   Start     Dose/Rate Route Frequency Ordered Stop   05/04/20 1800  cefTRIAXone (ROCEPHIN) 2 g in sodium chloride 0.9 % 100 mL IVPB  Status:  Discontinued        2 g 200 mL/hr over 30 Minutes Intravenous Every 24 hours 05/03/20 1905 05/04/20 0814   05/04/20 1300  azithromycin (ZITHROMAX) 500 mg in sodium chloride 0.9 % 250 mL IVPB        500 mg 250 mL/hr over 60 Minutes Intravenous Every 24 hours 05/03/20 2348     05/04/20 1000  ceFEPIme (MAXIPIME) 2 g in sodium chloride 0.9 % 100 mL IVPB        2 g 200 mL/hr over 30 Minutes Intravenous Every 8 hours 05/04/20 0832     05/04/20 1000  metroNIDAZOLE (FLAGYL) tablet 500 mg        500 mg Oral Every 8 hours 05/04/20 0832     05/03/20 1800  cefTRIAXone (ROCEPHIN) 1 g in sodium chloride 0.9 % 100 mL IVPB  Status:  Discontinued        1 g 200 mL/hr over 30 Minutes Intravenous Every 24 hours 05/03/20 1754 05/03/20 1905   05/03/20 1300  cefTRIAXone (ROCEPHIN) 1 g in sodium chloride 0.9 % 100 mL IVPB  Status:  Discontinued        1 g 200 mL/hr over 30 Minutes Intravenous Every 24 hours 05/03/20 1250 05/03/20 1905   05/03/20 1300  azithromycin (ZITHROMAX) 500 mg in sodium chloride 0.9 % 250 mL IVPB  Status:  Discontinued        500 mg 250 mL/hr over 60 Minutes Intravenous Every 24 hours  05/03/20 1250 05/03/20 1754   05/03/20 1245  levofloxacin (LEVAQUIN) IVPB 750 mg  Status:  Discontinued        750 mg 100 mL/hr over 90 Minutes Intravenous  Once 05/03/20 1243 05/03/20 1250         Subjective: Seen and examined.  Patient much more alert and seems to be oriented.  Able to talk with husky voice.  Denies any pain or shortness of breath.  Objective: Vitals:   05/06/20 2344 05/07/20 0415 05/07/20 0737 05/07/20 0742  BP: 111/70 121/71  126/70  Pulse: 85 (!) 107  (!) 108  Resp: 18 19  18   Temp: 98.5 F (36.9 C) 98.3 F (36.8 C)  99.4 F (37.4 C)  TempSrc: Axillary Axillary    SpO2: 94% 94% 95% 93%  Weight:  Height:        Intake/Output Summary (Last 24 hours) at 05/07/2020 1121 Last data filed at 05/07/2020 0400 Gross per 24 hour  Intake --  Output 400 ml  Net -400 ml   Filed Weights   05/03/20 2120  Weight: 84.7 kg    Examination:  General exam: Appears calm and comfortable  Respiratory system: Rhonchi bilaterally. Respiratory effort normal. Cardiovascular system: S1 & S2 heard, RRR. No JVD, murmurs, rubs, gallops or clicks. No pedal edema. Gastrointestinal system: Abdomen is nondistended, soft and nontender. No organomegaly or masses felt. Normal bowel sounds heard. Central nervous system: Alert and oriented.  Quadriparesis with 1/5 power in all 4 extremities. Psychiatry: Judgement and insight appear normal. Mood & affect appropriate.    Data Reviewed: I have personally reviewed following labs and imaging studies  CBC: Recent Labs  Lab 05/03/20 1243 05/03/20 1243 05/03/20 1321 05/04/20 0001 05/05/20 0158 05/06/20 0444 05/07/20 0101  WBC 19.4*  --   --  20.5* 16.6* 17.6* 16.0*  NEUTROABS 16.8*  --   --   --  14.9* 15.7* 14.4*  HGB 14.8   < > 22.1* 12.9* 12.6* 13.3 13.1  HCT 47.4   < > 65.0* 40.5 39.9 42.3 42.0  MCV 92.2  --   --  89.0 91.3 90.2 91.5  PLT 133*  --   --  117* 127* 152 146*   < > = values in this interval not displayed.    Basic Metabolic Panel: Recent Labs  Lab 05/03/20 1243 05/03/20 1243 05/03/20 1321 05/04/20 0001 05/05/20 0158 05/06/20 0444 05/07/20 0101  NA 147*   < > 150* 145 150* 151* 158*  K 4.2   < > 4.3 3.8 3.7 3.5 3.7  CL 111  --   --  113* 117* 114* 117*  CO2 27  --   --  GLUCOSE 158*  --   --  167* 127* 144* 139*  BUN 20  --   --  27*  CREATININE 1.15  --   --  0.82 0.96 1.06 1.10  CALCIUM 8.5*  --   --  8.0* 8.2* 8.2* 8.5*  MG  --   --   --   --  2.1 2.1 2.2  PHOS  --   --   --   --   --  3.3  --    < > = values in this interval not displayed.   GFR: Estimated Creatinine Clearance: 64.7 mL/min (by C-G formula based on SCr of 1.1 mg/dL). Liver Function Tests: Recent Labs  Lab 05/03/20 1243 05/05/20 0158  AST 81* 50*  ALT 157* 89*  ALKPHOS 90 81  BILITOT 1.7* 1.0  PROT 6.2* 5.3*  ALBUMIN 2.2* 1.6*   No results for input(s): LIPASE, AMYLASE in the last 168 hours. No results for input(s): AMMONIA in the last 168 hours. Coagulation Profile: Recent Labs  Lab 05/03/20 1243  INR 1.7*   Cardiac Enzymes: No results for input(s): CKTOTAL, CKMB, CKMBINDEX, TROPONINI in the last 168 hours. BNP (last 3 results) No results for input(s): PROBNP in the last 8760 hours. HbA1C: No results for input(s): HGBA1C in the last 72 hours. CBG: Recent Labs  Lab 05/04/20 0755 05/06/20 2000  GLUCAP 115* 128*   Lipid Profile: No results for input(s): CHOL, HDL, LDLCALC, TRIG, CHOLHDL, LDLDIRECT in the last 72 hours. Thyroid Function Tests: No results for input(s): TSH, T4TOTAL, FREET4, T3FREE, THYROIDAB in the last  72 hours. Anemia Panel: No results for input(s): VITAMINB12, FOLATE, FERRITIN, TIBC, IRON, RETICCTPCT in the last 72 hours. Sepsis Labs: Recent Labs  Lab 05/03/20 1538 05/04/20 0821 05/04/20 1117 05/05/20 1442  LATICACIDVEN 2.5* 1.4 2.1* 1.8    Recent Results (from the past 240 hour(s))  SARS Coronavirus 2 by RT PCR (hospital order, performed  in [facility] hospital lab) Nasopharyngeal Nasopharyngeal Swab     Status: None   Collection Time: 04/27/20  2:35 PM   Specimen: Nasopharyngeal Swab  Result Value Ref Range Status   SARS Coronavirus 2 NEGATIVE NEGATIVE Final    Comment: (NOTE) SARS-CoV-2 target nucleic acids are NOT DETECTED.  The SARS-CoV-2 RNA is generally detectable in upper and lower respiratory specimens during the acute phase of infection. The lowest concentration of SARS-CoV-2 viral copies this assay can detect is 250 copies / mL. A negative result does not preclude SARS-CoV-2 infection and should not be used as the sole basis for treatment or other patient management decisions.  A negative result may occur with improper specimen collection / handling, submission of specimen other than nasopharyngeal swab, presence of viral mutation(s) within the areas targeted by this assay, and inadequate number of viral copies (<250 copies / mL). A negative result must be combined with clinical observations, patient history, and epidemiological information.  Fact Sheet for Patients:   BoilerBrush.com.cy  Fact Sheet for Healthcare Providers: https://pope.com/  This test is not yet approved or  cleared by the [place] FDA and has been authorized for detection and/or diagnosis of SARS-CoV-2 by FDA under an Emergency Use Authorization (EUA).  This EUA will remain in effect (meaning this test can be used) for the duration of the COVID-19 declaration under Section 564(b)(1) of the Act, 21 U.S.C. section 360bbb-3(b)(1), unless the authorization is terminated or revoked sooner.  Performed at [facility], [address] [place], [place] [address]   Urine culture     Status: Abnormal   Collection Time: 05/03/20 12:43 PM   Specimen: In/Out Cath Urine  Result Value Ref Range Status   Specimen Description IN/OUT CATH URINE  Final   Special Requests   Final     NONE Performed at [facility], [address], [place], [place] [address]    Culture (A)  Final    >=100,000 COLONIES/mL KLEBSIELLA PNEUMONIAE >=100,000 COLONIES/mL ESCHERICHIA COLI    Report Status 05/06/2020 FINAL  Final   Organism ID, Bacteria KLEBSIELLA PNEUMONIAE (A)  Final   Organism ID, Bacteria ESCHERICHIA COLI (A)  Final      Susceptibility   Escherichia coli - MIC*    AMPICILLIN <=2 SENSITIVE Sensitive     CEFAZOLIN <=4 SENSITIVE Sensitive     CEFEPIME <=0.12 SENSITIVE Sensitive     CEFTRIAXONE <=0.25 SENSITIVE Sensitive     CIPROFLOXACIN <=0.25 SENSITIVE Sensitive     GENTAMICIN <=1 SENSITIVE Sensitive     IMIPENEM <=0.25 SENSITIVE Sensitive     NITROFURANTOIN <=16 SENSITIVE Sensitive     TRIMETH/SULFA <=20 SENSITIVE Sensitive     AMPICILLIN/SULBACTAM <=2 SENSITIVE Sensitive     PIP/TAZO <=4 SENSITIVE Sensitive     * >=100,000 COLONIES/mL ESCHERICHIA COLI   Klebsiella pneumoniae - MIC*    AMPICILLIN >=32 RESISTANT Resistant     CEFAZOLIN <=4 SENSITIVE Sensitive     CEFEPIME <=0.12 SENSITIVE Sensitive     CEFTRIAXONE <=0.25 SENSITIVE Sensitive     CIPROFLOXACIN <=0.25 SENSITIVE Sensitive     GENTAMICIN <=1 SENSITIVE Sensitive  IMIPENEM <=0.25 SENSITIVE Sensitive     NITROFURANTOIN 64 INTERMEDIATE Intermediate     TRIMETH/SULFA <=20 SENSITIVE Sensitive     AMPICILLIN/SULBACTAM 4 SENSITIVE Sensitive     PIP/TAZO <=4 SENSITIVE Sensitive     * >=100,000 COLONIES/mL KLEBSIELLA PNEUMONIAE  Blood Culture (routine x 2)     Status: None (Preliminary result)   Collection Time: 05/03/20  1:00 PM   Specimen: BLOOD  Result Value Ref Range Status   Specimen Description BLOOD SITE NOT SPECIFIED  Final   Special Requests   Final    BOTTLES DRAWN AEROBIC AND ANAEROBIC Blood Culture results may not be optimal due to an inadequate volume of blood received in culture bottles   Culture   Final    NO GROWTH 4 DAYS Performed at [facility], [address],  [place], [place] [address]    Report Status PENDING  Incomplete  Blood Culture (routine x 2)     Status: None (Preliminary result)   Collection Time: 05/03/20  1:16 PM   Specimen: BLOOD  Result Value Ref Range Status   Specimen Description BLOOD SITE NOT SPECIFIED  Final   Special Requests   Final    BOTTLES DRAWN AEROBIC AND ANAEROBIC Blood Culture results may not be optimal due to an inadequate volume of blood received in culture bottles   Culture   Final    NO GROWTH 4 DAYS Performed at [facility], [address] [place], [place] [address]    Report Status PENDING  Incomplete  Respiratory Panel by RT PCR (Flu A&B, Covid) - Nasopharyngeal Swab     Status: None   Collection Time: 05/03/20 11:38 PM   Specimen: Nasopharyngeal Swab  Result Value Ref Range Status   SARS Coronavirus 2 by RT PCR NEGATIVE NEGATIVE Final    Comment: (NOTE) SARS-CoV-2 target nucleic acids are NOT DETECTED.  The SARS-CoV-2 RNA is generally detectable in upper respiratoy specimens during the acute phase of infection. The lowest concentration of SARS-CoV-2 viral copies this assay can detect is 131 copies/mL. A negative result does not preclude SARS-Cov-2 infection and should not be used as the sole basis for treatment or other patient management decisions. A negative result may occur with  improper specimen collection/handling, submission of specimen other than nasopharyngeal swab, presence of viral mutation(s) within the areas targeted by this assay, and inadequate number of viral copies (<131 copies/mL). A negative result must be combined with clinical observations, patient history, and epidemiological information. The expected result is Negative.  Fact Sheet for Patients:  https://www.moore.com/  Fact Sheet for Healthcare Providers:  https://www.young.biz/  This test is no t yet approved or cleared by the [place] FDA and  has been authorized for  detection and/or diagnosis of SARS-CoV-2 by FDA under an Emergency Use Authorization (EUA). This EUA will remain  in effect (meaning this test can be used) for the duration of the COVID-19 declaration under Section 564(b)(1) of the Act, 21 U.S.C. section 360bbb-3(b)(1), unless the authorization is terminated or revoked sooner.     Influenza A by PCR NEGATIVE NEGATIVE Final   Influenza B by PCR NEGATIVE NEGATIVE Final    Comment: (NOTE) The Xpert Xpress SARS-CoV-2/FLU/RSV assay is intended as an aid in  the diagnosis of influenza from Nasopharyngeal swab specimens and  should not be used as a sole basis for treatment. Nasal washings and  aspirates are unacceptable for Xpert Xpress SARS-CoV-2/FLU/RSV  testing.  Fact Sheet for Patients: https://www.moore.com/  Fact Sheet  for Healthcare Providers: https://www.young.biz/  This test is not yet approved or cleared by the [place] and  has been authorized for detection and/or diagnosis of SARS-CoV-2 by  FDA under an Emergency Use Authorization (EUA). This EUA will remain  in effect (meaning this test can be used) for the duration of the  Covid-19 declaration under Section 564(b)(1) of the Act, 21  U.S.C. section 360bbb-3(b)(1), unless the authorization is  terminated or revoked. Performed at [facility], [address], [place], [place] [address]       Radiology Studies: DG Chest Port 1 View  Result Date: 05/06/2020 CLINICAL DATA:  Shortness of breath EXAM: PORTABLE CHEST 1 VIEW COMPARISON:  May 05, 2020 FINDINGS: Persistent right pleural effusion with areas of atelectatic change in the lung bases noted. Heart size and pulmonary vascularity are normal. No adenopathy. No bone lesions. IMPRESSION: Stable right pleural effusion with bibasilar atelectasis. A degree of superimposed infiltrate in the right base cannot be excluded. Stable cardiac silhouette. Electronically Signed    By: Bretta Bang III M.D.   On: 05/06/2020 08:29    Scheduled Meds: . acetylcysteine  4 mL Nebulization BID  . albuterol  2.5 mg Nebulization BID  . Chlorhexidine Gluconate Cloth  6 each Topical Daily  . guaiFENesin  1,200 mg Oral BID  . metroNIDAZOLE  500 mg Oral Q8H  . pantoprazole (PROTONIX) IV  40 mg Intravenous Q24H  . polyethylene glycol  17 g Oral Daily  . tamsulosin  0.4 mg Oral Daily   Continuous Infusions: . azithromycin 500 mg (05/06/20 1118)  . ceFEPime (MAXIPIME) IV 2 g (05/07/20 0820)  . dextrose 5 % with KCl 20 mEq / L 20 mEq (05/07/20 0933)     LOS: 4 days   Time spent: 27 minutes  Hughie Closs, MD [facility]  05/07/2020, 11:21 AM   To contact the attending provider between 7A-7P or the covering provider during after hours 7P-7A, please log into the web site [email or web address].  Addendum: I was able to speak to his daughter.  I did explain to her the poor prognosis that her father has.  I reconfirmed with her and she wants to keep him full code and would like for him to be intubated if need be.

## 2020-05-07 NOTE — Progress Notes (Signed)
Pt doing very well this morning, was able to drink [employer] Apple juice & 7 oz of Nectar Ensure,

## 2020-05-08 ENCOUNTER — Inpatient Hospital Stay (HOSPITAL_COMMUNITY): Payer: Medicare Other

## 2020-05-08 DIAGNOSIS — N390 Urinary tract infection, site not specified: Secondary | ICD-10-CM | POA: Diagnosis not present

## 2020-05-08 DIAGNOSIS — R069 Unspecified abnormalities of breathing: Secondary | ICD-10-CM

## 2020-05-08 DIAGNOSIS — A419 Sepsis, unspecified organism: Secondary | ICD-10-CM | POA: Diagnosis not present

## 2020-05-08 DIAGNOSIS — R0603 Acute respiratory distress: Secondary | ICD-10-CM | POA: Diagnosis not present

## 2020-05-08 LAB — CBC WITH DIFFERENTIAL/PLATELET
Abs Immature Granulocytes: 0.12 10*3/uL — ABNORMAL HIGH (ref 0.00–0.07)
Basophils Absolute: 0 10*3/uL (ref 0.0–0.1)
Basophils Relative: 0 %
Eosinophils Absolute: 0.1 10*3/uL (ref 0.0–0.5)
Eosinophils Relative: 0 %
HCT: 40.5 % (ref 39.0–52.0)
Hemoglobin: 12.5 g/dL — ABNORMAL LOW (ref 13.0–17.0)
Immature Granulocytes: 1 %
Lymphocytes Relative: 8 %
Lymphs Abs: 1.1 10*3/uL (ref 0.7–4.0)
MCH: 28.8 pg (ref 26.0–34.0)
MCHC: 30.9 g/dL (ref 30.0–36.0)
MCV: 93.3 fL (ref 80.0–100.0)
Monocytes Absolute: 0.6 10*3/uL (ref 0.1–1.0)
Monocytes Relative: 5 %
Neutro Abs: 11.7 10*3/uL — ABNORMAL HIGH (ref 1.7–7.7)
Neutrophils Relative %: 86 %
Platelets: 130 10*3/uL — ABNORMAL LOW (ref 150–400)
RBC: 4.34 MIL/uL (ref 4.22–5.81)
RDW: 14.6 % (ref 11.5–15.5)
WBC: 13.6 10*3/uL — ABNORMAL HIGH (ref 4.0–10.5)
nRBC: 0 % (ref 0.0–0.2)

## 2020-05-08 LAB — CULTURE, BLOOD (ROUTINE X 2)
Culture: NO GROWTH
Culture: NO GROWTH

## 2020-05-08 LAB — BASIC METABOLIC PANEL
Anion gap: 6 (ref 5–15)
BUN: 22 mg/dL (ref 8–23)
CO2: 27 mmol/L (ref 22–32)
Calcium: 8.4 mg/dL — ABNORMAL LOW (ref 8.9–10.3)
Chloride: 116 mmol/L — ABNORMAL HIGH (ref 98–111)
Creatinine, Ser: 1.06 mg/dL (ref 0.61–1.24)
GFR, Estimated: >60 mL/min (ref >60)
Glucose, Bld: 210 mg/dL — ABNORMAL HIGH (ref 70–99)
Potassium: 2.9 mmol/L — ABNORMAL LOW (ref 3.5–5.1)
Sodium: 149 mmol/L — ABNORMAL HIGH (ref 135–145)

## 2020-05-08 IMAGING — DX DG CHEST 1V PORT
1 series · 1 of 1 positions shown · non-contrast
Comparison: Two days ago

CLINICAL DATA: Mucous plugging

EXAM:
PORTABLE CHEST 1 VIEW

[chest ap]
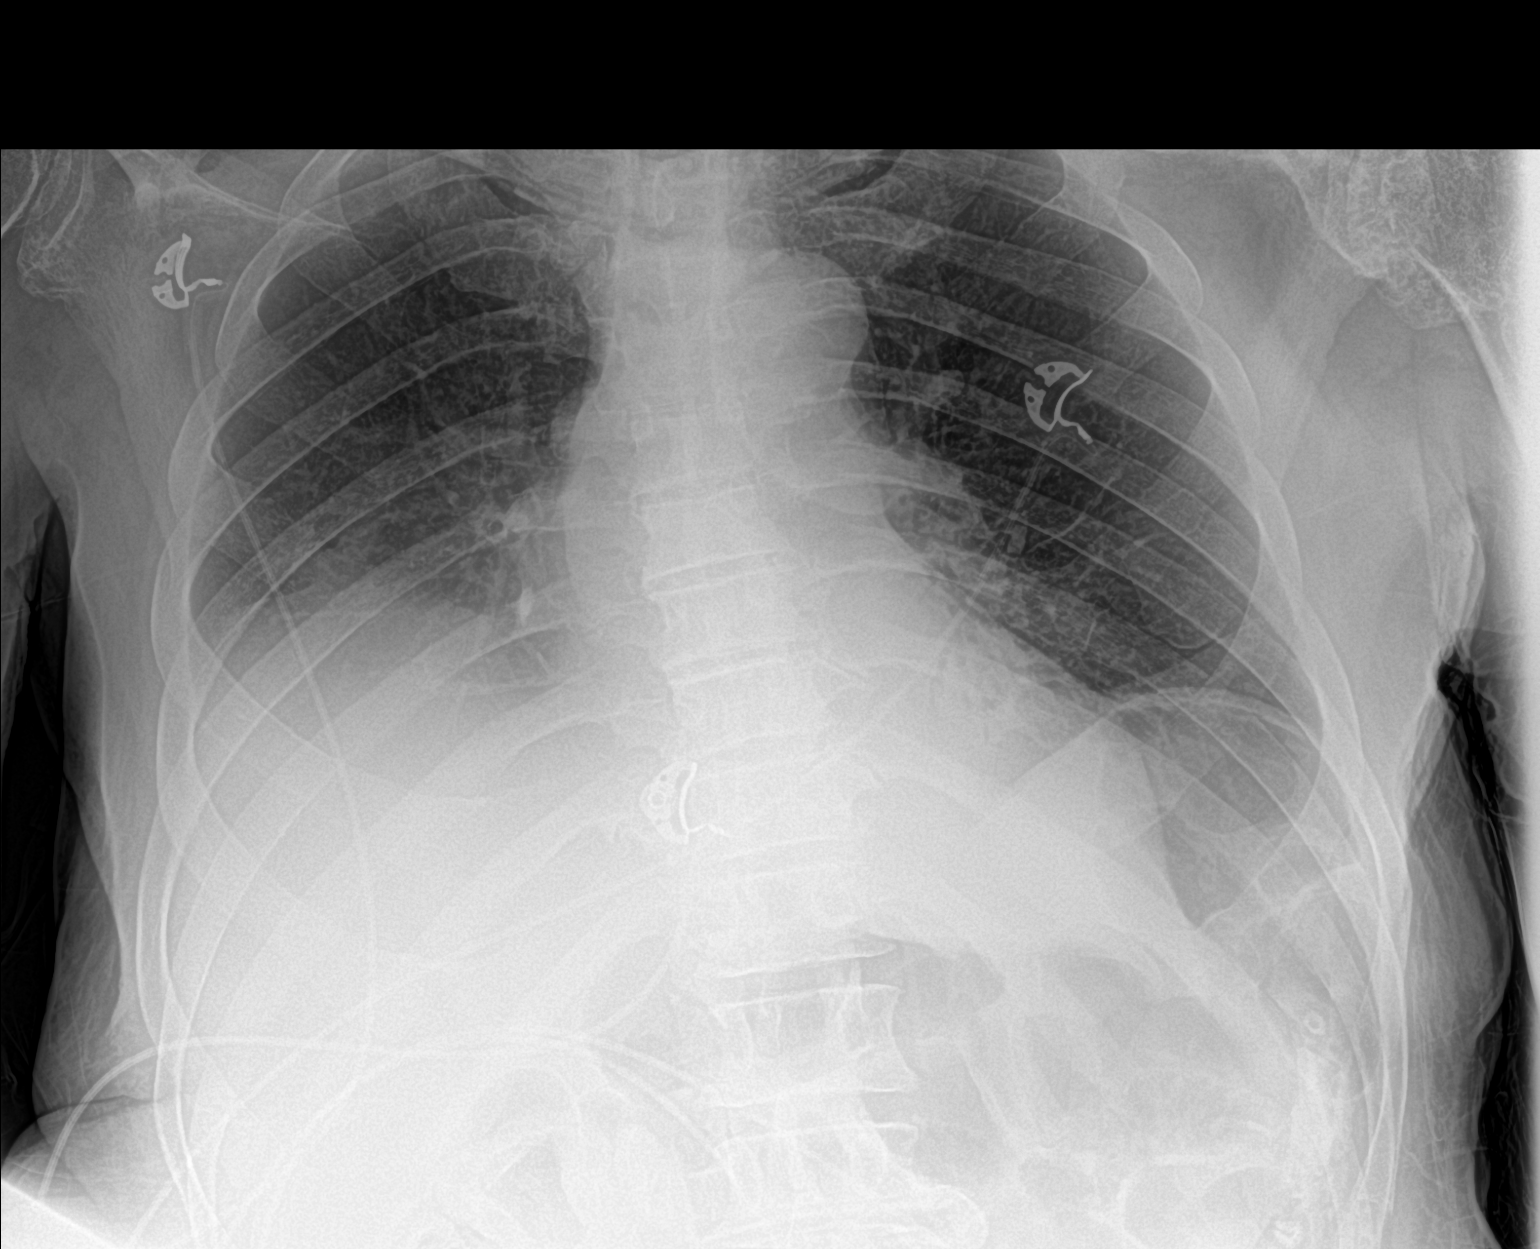

[1 of 1 positions shown; findings below may reference images not displayed]

FINDINGS: Low volume chest with opacity at both lung bases. Air bronchograms
seen on the left. No edema, effusion, or pneumothorax. Normal heart
size
IMPRESSION: Continued volume loss with extensive atelectasis at the bases.

## 2020-05-08 MED ORDER — POTASSIUM CHLORIDE CRYS ER 20 MEQ PO TBCR
40.0000 meq | EXTENDED_RELEASE_TABLET | ORAL | Status: AC
  Administered 2020-05-08 (×2): 40 meq via ORAL
  Filled 2020-05-08 (×2): qty 2

## 2020-05-08 NOTE — Plan of Care (Signed)

## 2020-05-08 NOTE — TOC Initial Note (Addendum)
Transition of Care [facility]) - Initial/Assessment Note    Patient Details  Name: Shannon [name] MRN: [id number] Date of Birth: Sep 22, 1945  Transition of Care [facility]) CM/SW Contact:    Lorri Frederick, LCSW Phone Number: 05/08/2020, 11:09 AM  Clinical Narrative:   CSW spoke with pt regarding discharge plan.  Pt went to [facility] for rehab on 11/5, prior to that was living home alone.  Pt aware that PT is still recommending rehab and is in agreement with that plan, does want to return to [facility].  Pt is vaccinated for covid.  Permission given to speak with daughter Marjorie Smolder and with [employer].    1100: LM for pt daughter, [name].              Expected Discharge Plan: Skilled Nursing Facility Barriers to Discharge: Continued Medical Work up   Patient Goals and CMS Choice Patient states their goals for this hospitalization and ongoing recovery are:: "get back to normal life" CMS Medicare.gov Compare Post Acute Care list provided to::  (Pt is from [place] [facility] and wants to return there)    Expected Discharge Plan and Services Expected Discharge Plan: Skilled Nursing Facility     Post Acute Care Choice: Resumption of Svcs/PTA Provider, Skilled Nursing Facility [facility]) Living arrangements for the past 2 months: Skilled Nursing Facility, Single Family Home                                      Prior Living Arrangements/Services Living arrangements for the past 2 months: Skilled Nursing Facility, Single Family Home Lives with:: Facility Resident Patient language and need for interpreter reviewed:: Yes Do you feel safe going back to the place where you live?: Yes      Need for Family Participation in Patient Care: Yes (Comment) Care giver support system in place?: Yes (comment) Current home services: Other (comment) (none) Criminal Activity/Legal Involvement Pertinent to Current Situation/Hospitalization: No - Comment as needed  Activities of Daily  Living Home Assistive Devices/Equipment: C-collar ADL Screening (condition at time of admission) Patient's cognitive ability adequate to safely complete daily activities?: Yes Is the patient deaf or have difficulty hearing?: No Does the patient have difficulty seeing, even when wearing glasses/contacts?: No Does the patient have difficulty concentrating, remembering, or making decisions?: No Patient able to express need for assistance with ADLs?: Yes Does the patient have difficulty dressing or bathing?: Yes Independently performs ADLs?: No Communication: Independent Dressing (OT): Dependent Is this a change from baseline?: Pre-admission baseline Grooming: Dependent Is this a change from baseline?: Pre-admission baseline Bathing: Dependent Is this a change from baseline?: Pre-admission baseline Toileting: Dependent Is this a change from baseline?: Pre-admission baseline In/Out Bed: Dependent Is this a change from baseline?: Pre-admission baseline Walks in Home: Dependent Is this a change from baseline?: Pre-admission baseline Does the patient have difficulty walking or climbing stairs?: Yes Weakness of Legs: Both Weakness of Arms/Hands: Both  Permission Sought/Granted Permission sought to share information with : Facility Medical sales representative, Family Supports Permission granted to share information with : Yes, Verbal Permission Granted  Share Information with NAME: daughter, [name]  Permission granted to share info w AGENCY: [facility]        Emotional Assessment Appearance:: Appears stated age Attitude/Demeanor/Rapport: Engaged Affect (typically observed): Appropriate, Pleasant Orientation: : Oriented to Self, Oriented to Place, Oriented to Situation Alcohol / Substance Use: Not Applicable Psych Involvement:  No (comment)  Admission diagnosis:  Pneumonia [J18.9] Elevated LFTs [R79.89] Sepsis with acute hypoxic respiratory failure without septic shock, due to  unspecified organism (HCC) [A41.9, R65.20, J96.01] Patient Active Problem List   Diagnosis Date Noted  . Protein-calorie malnutrition, severe 05/05/2020  . Pressure injury of skin 05/04/2020  . Pneumonia 05/03/2020  . Sepsis secondary to UTI (HCC) 05/03/2020  . Urinary retention 05/03/2020  . Chronic indwelling Foley catheter 05/03/2020  . 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 Myelopathy (HCC) 04/22/2020  . Quadriplegia and quadriparesis (HCC)   . Acute blood loss anemia   . Slow transit constipation   . Essential hypertension   . Cervical myelopathy (HCC) 04/07/2020  . Quadriparesis (HCC)   . Benign essential HTN   . Post-operative pain   . Neuropathic pain   . Asthma   . 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   PCP:  [name], MD Pharmacy:   [facility] [phone] - [place], [place] - [address] [place] [address] Phone: [phone] Fax: [phone]     Social Determinants of Health (SDOH) Interventions    Readmission Risk Interventions No flowsheet data found.

## 2020-05-08 NOTE — Progress Notes (Signed)
Ok to stop azithromycin and cont cefepime and flagyl for one more day to complete 7d of therapy per Dr. Jacqulyn Bath.  Ulyses Southward, PharmD, BCIDP, AAHIVP, CPP Infectious Disease Pharmacist 05/08/2020 10:51 AM

## 2020-05-08 NOTE — NC FL2 (Signed)
[place] MEDICAID FL2 LEVEL OF CARE SCREENING TOOL     IDENTIFICATION  Patient Name: Shannon [name] Birthdate: 02/26/1946 Sex: male Admission Date (Current Location): 05/03/2020  [facility] and [place] Number:  Producer, television/film/video and Address:  [facility], [address], [place], [place] [address]      Provider Number: [id number]  Attending Physician Name and Address:  Hughie Closs, MD  Relative Name and Phone Number:  Weisinger,[name] Daughter   [phone]    Current Level of Care: Hospital Recommended Level of Care: Skilled Nursing Facility Prior Approval Number:    Date Approved/Denied:   PASRR Number: [id number]  Discharge Plan: SNF    Current Diagnoses: Patient Active Problem List   Diagnosis Date Noted  . Protein-calorie malnutrition, severe 05/05/2020  . Pressure injury of skin 05/04/2020  . Pneumonia 05/03/2020  . Sepsis secondary to UTI (HCC) 05/03/2020  . Urinary retention 05/03/2020  . Chronic indwelling Foley catheter 05/03/2020  . 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 Myelopathy (HCC) 04/22/2020  . Quadriplegia and quadriparesis (HCC)   . Acute blood loss anemia   . Slow transit constipation   . Essential hypertension   . Cervical myelopathy (HCC) 04/07/2020  . Quadriparesis (HCC)   . Benign essential HTN   . Post-operative pain   . Neuropathic pain   . Asthma   . 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    Orientation RESPIRATION BLADDER Height & Weight     Self, Situation, Place  O2 Incontinent Weight: 186 lb 11.7 oz (84.7 kg) Height:  6' (182.9 cm)  BEHAVIORAL SYMPTOMS/MOOD NEUROLOGICAL BOWEL NUTRITION STATUS      Incontinent Diet (full liquid, see discharge summary)  AMBULATORY STATUS COMMUNICATION OF NEEDS Skin   Total Care Verbally Other (Comment) (scratches)                       Personal Care Assistance Level of Assistance    Bathing Assistance: Maximum  assistance Feeding assistance: Maximum assistance Dressing Assistance: Maximum assistance     Functional Limitations Info    Sight Info: Adequate Hearing Info: Adequate Speech Info: Adequate    SPECIAL CARE FACTORS FREQUENCY        PT Frequency: 5x week OT Frequency: 5x week            Contractures Contractures Info: Not present    Additional Factors Info  Code Status, Allergies Code Status Info: full Allergies Info: Iodine, Penicillings           Current Medications (05/08/2020):  This is the current hospital active medication list Current Facility-Administered Medications  Medication Dose Route Frequency Provider Last Rate Last Admin  . acetaminophen (TYLENOL) tablet 650 mg  650 mg Oral Q6H PRN Arrien, York Ram, MD   650 mg at 05/04/20 1834   Or  . acetaminophen (TYLENOL) suppository 650 mg  650 mg Rectal Q6H PRN Arrien, York Ram, MD      . ceFEPIme (MAXIPIME) 2 g in sodium chloride 0.9 % 100 mL IVPB  2 g Intravenous Q8H Pham, Minh Q, RPH-CPP 200 mL/hr at 05/08/20 0900 2 g at 05/08/20 0900  . Chlorhexidine Gluconate Cloth 2 % PADS 6 each  6 each Topical Daily Hughie Closs, MD   6 each at 05/08/20 1039  . food thickener (SIMPLYTHICK) packet 2 packet  2 packet Oral PRN Hughie Closs, MD      . guaiFENesin (ROBITUSSIN) 100 MG/5ML solution 400 mg  20 mL Oral Q6H Hughie Closs, MD   400 mg at 05/08/20 0536  . ibuprofen (ADVIL) tablet 600 mg  600 mg Oral Q6H PRN Hughie Closs, MD   600 mg at 05/07/20 1926  . metroNIDAZOLE (FLAGYL) tablet 500 mg  500 mg Oral Q8H Pham, Minh Q, RPH-CPP   500 mg at 05/08/20 0900  . ondansetron (ZOFRAN) tablet 4 mg  4 mg Oral Q6H PRN Arrien, York Ram, MD       Or  . ondansetron [facility]) injection 4 mg  4 mg Intravenous Q6H PRN Arrien, York Ram, MD      . pantoprazole (PROTONIX) injection 40 mg  40 mg Intravenous Q24H John Giovanni, MD   40 mg at 05/07/20 2331  . polyethylene glycol (MIRALAX / GLYCOLAX) packet  17 g  17 g Oral Daily Coralie Keens, MD   17 g at 05/07/20 0820  . potassium chloride SA (KLOR-CON) CR tablet 40 mEq  40 mEq Oral Q4H Hughie Closs, MD   40 mEq at 05/08/20 1038  . tamsulosin (FLOMAX) capsule 0.4 mg  0.4 mg Oral Daily Arrien, York Ram, MD   0.4 mg at 05/08/20 0900     Discharge Medications: Please see discharge summary for a list of discharge medications.  Relevant Imaging Results:  Relevant Lab Results:   Additional Information SSN#[id number]  Lorri Frederick, LCSW

## 2020-05-08 NOTE — Progress Notes (Signed)
Occupational Therapy Treatment Patient Details Name: Shannon [name] MRN: [id number] DOB: Dec 13, 1945 Today's Date: 05/08/2020    History of present illness 74 year old white male who presented 11/10 with RLL PNA and known history of  Quadriparesis.  He originally had a C3-4, C 4-5 ACDF on 04/05/2020.  He then went to CIR and was discharged.  Readmitted 04/21/2020 with progressive bilateral quadriplegia due to hematoma with subsequent decompression with D/c to [facility] 04/28/20   OT comments  Pt seen in conjunction with PT to optimize participation.  Patient initially opening eyes when asked but fades quickly to squeezing eyes closed when asked to open, briefly opens at EOB once sitting.  Perseverating on "I love you", "you all know I love you" when asked any question, and not following any functional commands other than light grasp with B hands.  +2 total assist for bed mobility and washing face at EOB.  Will follow acutely to progress to optimize participation and decrease burden of care.      Follow Up Recommendations  SNF;Supervision/Assistance - 24 hour    Equipment Recommendations  None recommended by OT    Recommendations for Other Services Other (comment) (palliative care )    Precautions / Restrictions Precautions Precautions: Fall;Cervical Precaution Booklet Issued: No Precaution Comments: pt not cognitively able to recieve instructions at this time. Practiced log roll Required Braces or Orthoses: Cervical Brace Cervical Brace: Hard collar;At all times Restrictions Weight Bearing Restrictions: No Other Position/Activity Restrictions: collar too short and pt moving head freely on arrival with collar adjusted       Mobility Bed Mobility Overal bed mobility: Needs Assistance   Rolling: Total assist;+2 for physical assistance;+2 for safety/equipment Sidelying to sit: Total assist;+2 for physical assistance;+2 for safety/equipment   Sit to supine: Total assist;+2 for  safety/equipment;+2 for physical assistance   General bed mobility comments: total +2 for all bed mobility rolling bil , side to sit and return to supine. pt able to sit EOB 11 min with total assist no righting reactions  Transfers                 General transfer comment: unable    Balance Overall balance assessment: Needs assistance Sitting-balance support: Feet supported;No upper extremity supported Sitting balance-Leahy Scale: Zero                                     ADL either performed or assessed with clinical judgement   ADL Overall ADL's : Needs assistance/impaired     Grooming: Total assistance;Sitting;Wash/dry face Grooming Details (indicate cue type and reason): hand over hand for completion at EOB with 2nd person total for sitting balance                              Functional mobility during ADLs: Total assistance;+2 for physical assistance;+2 for safety/equipment (EOB only )       Vision   Additional Comments: further assessment required   Perception     Praxis      Cognition Arousal/Alertness: Lethargic Behavior During Therapy: Flat affect Overall Cognitive Status: No family/caregiver present to determine baseline cognitive functioning Area of Impairment: Following commands;Orientation;Problem solving;Awareness;Attention;Memory;Safety/judgement                 Orientation Level: Disoriented to;Time;Situation;Place Current Attention Level: Focused Memory: Decreased short-term memory Following Commands: Follows one step commands inconsistently;Follows  one step commands with increased time Safety/Judgement: Decreased awareness of safety;Decreased awareness of deficits Awareness: Intellectual Problem Solving: Slow processing;Requires verbal cues;Requires tactile cues General Comments: patient maintaining eyes closed during session, initally opening with increased time x 2 but fading to squeezing eyes shut when  asked to open; able to squeeze hands on command, but otherwise not following.  Pt perseverating on "i love you" and "red" color hair for therapists.          Exercises Exercises: Other exercises General Exercises - Lower Extremity Ankle Circles/Pumps: PROM;Seated;10 reps Long Arc Quad: PROM;Both;Seated;10 reps Other Exercises Other Exercises: PROM on BUEs   Shoulder Instructions       General Comments      Pertinent Vitals/ Pain       Pain Assessment: Faces Faces Pain Scale: Hurts little more Pain Location: generalized with rolling Pain Descriptors / Indicators: Grimacing Pain Intervention(s): Limited activity within patient's tolerance;Monitored during session;Repositioned  Home Living                                          Prior Functioning/Environment              Frequency  Min 2X/week        Progress Toward Goals  OT Goals(current goals can now be found in the care plan section)  Progress towards OT goals: Not progressing toward goals - comment (limited engagement in session)  Acute Rehab OT Goals Patient Stated Goal: not stated this session OT Goal Formulation: With patient  Plan Discharge plan remains appropriate;Frequency remains appropriate    Co-evaluation    PT/OT/SLP Co-Evaluation/Treatment: Yes Reason for Co-Treatment: Complexity of the patient's impairments (multi-system involvement);For patient/therapist safety;To address functional/ADL transfers;Necessary to address cognition/behavior during functional activity PT goals addressed during session: Mobility/safety with mobility;Strengthening/ROM OT goals addressed during session: ADL's and self-care;Strengthening/ROM      AM-PAC OT "6 Clicks" Daily Activity     Outcome Measure   Help from another person eating meals?: Total Help from another person taking care of personal grooming?: Total Help from another person toileting, which includes using toliet, bedpan, or  urinal?: Total Help from another person bathing (including washing, rinsing, drying)?: Total Help from another person to put on and taking off regular upper body clothing?: Total Help from another person to put on and taking off regular lower body clothing?: Total 6 Click Score: 6    End of Session Equipment Utilized During Treatment: Cervical collar  OT Visit Diagnosis: Unsteadiness on feet (R26.81);Muscle weakness (generalized) (M62.81);Other symptoms and signs involving the nervous system (R29.898)   Activity Tolerance Patient limited by fatigue   Patient Left in bed;with call bell/phone within reach;with bed alarm set;with nursing/sitter in room;with SCD's reapplied   Nurse Communication Mobility status;Need for lift equipment        Time: 0935-1007 OT Time Calculation (min): 32 min  Charges: OT General Charges $OT Visit: 1 Visit OT Treatments $Self Care/Home Management : 8-22 mins  Barry Brunner, OT Acute Rehabilitation Services Pager [phone] Office [phone]    Chancy Milroy 05/08/2020, 11:13 AM

## 2020-05-08 NOTE — Progress Notes (Signed)
Physical Therapy Treatment Patient Details Name: Shannon [name] MRN: [id number] DOB: January 29, 1946 Today's Date: 05/08/2020    History of Present Illness 74 year old white male who presented 11/10 with RLL PNA and known history of  Quadriparesis.  He originally had a C3-4, C 4-5 ACDF on 04/05/2020.  He then went to CIR and was discharged.  Readmitted 04/21/2020 with progressive bilateral quadriplegia due to hematoma with subsequent decompression with D/c to SNF 04/28/20    PT Comments    Pt seen in conjunction with OT maintaining eyes closed throughout and responding to all color questions as "red" 100%inaccurate and stating "I love you". Pt moving head despite collar with collar adjusted on arrival. Pt only demonstrating weak bil grip and right wrist movement as well as facial expressions but no other active movement during session. Will follow for acute trial as pt able to follow commands and engage in session.    Follow Up Recommendations  SNF     Equipment Recommendations  Hospital bed;None recommended by PT    Recommendations for Other Services       Precautions / Restrictions Precautions Precautions: Fall;Cervical Precaution Comments: pt not cognitively able to recieve instructions at this time. Practiced log roll Required Braces or Orthoses: Cervical Brace Cervical Brace: Hard collar;At all times Restrictions Other Position/Activity Restrictions: collar too short and pt moving head freely on arrival with collar adjusted    Mobility  Bed Mobility Overal bed mobility: Needs Assistance   Rolling: Total assist;+2 for physical assistance;+2 for safety/equipment Sidelying to sit: Total assist;+2 for physical assistance;+2 for safety/equipment   Sit to supine: Total assist;+2 for safety/equipment;+2 for physical assistance   General bed mobility comments: total +2 for all bed mobility rolling bil , side to sit and return to supine. pt able to sit EOB 11 min with total assist no  righting reactions  Transfers                 General transfer comment: unable  Ambulation/Gait                 Stairs             Wheelchair Mobility    Modified Rankin (Stroke Patients Only)       Balance Overall balance assessment: Needs assistance Sitting-balance support: Feet supported;No upper extremity supported Sitting balance-Leahy Scale: Zero                                      Cognition Arousal/Alertness: Lethargic Behavior During Therapy: Flat affect Overall Cognitive Status: No family/caregiver present to determine baseline cognitive functioning Area of Impairment: Following commands;Orientation                 Orientation Level: Disoriented to;Time;Situation;Place Current Attention Level: Focused Memory: Decreased short-term memory Following Commands: Follows one step commands inconsistently       General Comments: pt maintaining eyes closed throughout session. pt would respond to squeezing hands and able to move right wrist. no further command following. Pt responding to "" "2019", "red hair (for all therapists)"      Exercises General Exercises - Lower Extremity Ankle Circles/Pumps: PROM;Seated;10 reps Long Arc Quad: PROM;Both;Seated;10 reps    General Comments        Pertinent Vitals/Pain      Home Living  Prior Function            PT Goals (current goals can now be found in the care plan section) Progress towards PT goals: Not progressing toward goals - comment    Frequency    Min 2X/week      PT Plan Current plan remains appropriate;Frequency needs to be updated    Co-evaluation PT/OT/SLP Co-Evaluation/Treatment: Yes Reason for Co-Treatment: Complexity of the patient's impairments (multi-system involvement);Necessary to address cognition/behavior during functional activity;For patient/therapist safety PT goals addressed during session:  Mobility/safety with mobility;Strengthening/ROM        AM-PAC PT "6 Clicks" Mobility   Outcome Measure  Help needed turning from your back to your side while in a flat bed without using bedrails?: Total Help needed moving from lying on your back to sitting on the side of a flat bed without using bedrails?: Total Help needed moving to and from a bed to a chair (including a wheelchair)?: Total Help needed standing up from a chair using your arms (e.g., wheelchair or bedside chair)?: Total Help needed to walk in hospital room?: Total Help needed climbing 3-5 steps with a railing? : Total 6 Click Score: 6    End of Session Equipment Utilized During Treatment: Cervical collar Activity Tolerance: Patient tolerated treatment well Patient left: in bed;with call bell/phone within reach;with bed alarm set (pillow under left hip in partial chair position) Nurse Communication: Mobility status PT Visit Diagnosis: Muscle weakness (generalized) (M62.81);Difficulty in walking, not elsewhere classified (R26.2);Other symptoms and signs involving the nervous system (R29.898);Other (comment)     Time: 1497-0263 PT Time Calculation (min) (ACUTE ONLY): 34 min  Charges:  $Therapeutic Activity: 8-22 mins                     Florabel Faulks P, PT Acute Rehabilitation Services Pager: [phone] Office: [phone]    Korion Cuevas B Elazar Argabright 05/08/2020, 10:17 AM

## 2020-05-08 NOTE — Progress Notes (Signed)
Patient unable to wear and take off CPAP mask due to c collar. Patient is resting at this time with no respiratory distress noted. RT will continue to monitor patient as needed.

## 2020-05-08 NOTE — Progress Notes (Addendum)
  Speech Language Pathology Treatment: Dysphagia  Patient Details Name: Shannon [name] MRN: [id number] DOB: 09/10/1945 Today's Date: 05/08/2020 Time: 7209-4709 SLP Time Calculation (min) (ACUTE ONLY): 12 min  Assessment / Plan / Recommendation Clinical Impression  Pt cooperative during session but confused mistaking therapist for another person. He performed exercise to increase tongue base ROM with verbal and visual cues in 3 of 6 trials ([place]). Suspect swallow improvements will likely occur when pharyngeal edema decreases. Will introduce laryngeal elevation exercises. Trials nectar thick juice consumed with multiple swallows likely in attempts to clear pharyngeal residue. No cough/throat clear. He is able to achieve audible phonation when adequate inhalation initiated. Quadraplegia impairs efficiency of reflexive and volitional cough. Continue nectar thick full liquid texture.    HPI HPI: 74 year old white male who presented with right lower lobe pneumonia and known history of  Quadriparesis.  He originally had a C3-4 [place] 4-5 anterior cervical disc asked to me with decompression, C3 3-4 and C4-5 interbody arthrodesis with local autograft bone, anterior cervical plating of C3-C5 on 04/05/2020.  He then went to rehab and was discharged.  On 04/21/2020 the patient presented to the emergency room with progressive bilateral quadriplegia.  Patient went to the operating room and had decompression of a vertebral hematoma.  And been discharged on 11 10/2019 and readmitted on 05/03/2020 for increasing shortness of breath and productive cough.  Chest x-ray demonstrated a new right lower lobe infiltrate with compressive atelectasis, concerning for aspiration pneumonia.  Pt had MBSS during most recent admission on 04/25/2020 with recommendations for puree and thin liquids, advanced to Dys3 prior to discharge      SLP Plan  Continue with current plan of care       Recommendations  Diet recommendations:  Nectar-thick liquid;Other(comment) (full liquids) Liquids provided via: Cup;No straw Medication Administration: Crushed with puree Supervision: Staff to assist with self feeding Compensations: Slow rate;Small sips/bites;Follow solids with liquid Postural Changes and/or Swallow Maneuvers: Seated upright 90 degrees                Oral Care Recommendations: Oral care BID Follow up Recommendations: Skilled Nursing facility SLP Visit Diagnosis: Dysphagia, oropharyngeal phase (R13.12) Plan: Continue with current plan of care                      Royce Macadamia 05/08/2020, 11:18 AM   Breck Coons Lonell Face.Ed Nurse, children's [phone] Office [phone]

## 2020-05-08 NOTE — Progress Notes (Signed)
NAME:  Shannon [name], MRN:  [id number], DOB:  1945-11-14, LOS: 5 ADMISSION DATE:  05/03/2020, CONSULTATION DATE: 05/04/2020 REFERRING MD: Dr Loney Loh, CHIEF COMPLAINT:  SOB Called for consult at 03:22 Pt evaluated at 03:28  Brief History   74 year old white male with new right lower lobe pneumonia  History of present illness   74 year old white male who presented with right lower lobe pneumonia and known history of  Quadriparesis.  He originally had a C3-4 [place] 4-5 anterior cervical discectomy with decompression, C3 3-4 and C4-5 interbody arthrodesis with local autograft bone, anterior cervical plating of C3-C5 on 04/05/2020.  He then went to rehab and was discharged.  On 04/21/2020 the patient presented to the emergency room with progressive bilateral quadriplegia.  Patient went to the operating room and had decompression of a vertebral hematoma.  And been discharged on 11 10/2019 and readmitted on 05/03/2020 for increasing shortness of breath and productive cough.  Chest x-ray demonstrated a new right lower lobe infiltrate with compressive atelectasis.  Critical care medicine was consulted because of increasing oxygen demand and concern for respiratory pattern.  Currently the patient will nod appropriately to questions has a better than expected cough on demand.  Does not currently appear to be in any distress.  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  States he feels well.  Denies any pain  Objective   Blood pressure  98/62, pulse 97, temperature 98.2 F (36.8 C), temperature source Oral, resp. rate 20, height 6' (1.829 m), weight 84.7 kg, SpO2 95 %.        Intake/Output Summary (Last 24 hours) at 05/08/2020 0742 Last data filed at 05/08/2020 0541 Gross per 24 hour  Intake 3043.82 ml  Output 650 ml  Net 2393.82 ml   Filed Weights   05/03/20 2120  Weight: 84.7 kg    Examination: General: Chronically ill appearing frail elderly male lying in bed in no acute distress HEENT: ETT, MM pink/moist, PERRL, c-collar in place  neuro: Alert and interactive this morning much improved compared to my last evaluation CV: s1s2 regular rate and rhythm, no murmur, rubs, or gallops,  PULM: Clear to auscultation bilaterally, shallow respirations, no accessory muscle use seen.  Oxygen saturations 97% on 4 L nasal cannula therefore I wean the supplemental oxygen to 3 L GI: soft, bowel sounds active in all 4 quadrants, non-tender, non-distended Extremities: warm/dry, no edema  Skin: See images of decubitus ulcer below   Assessment & Plan:   Acute respiratory insufficiency  -In the setting quadriplegia post cervical discectomy and epidural hematoma.  Both venous and arterial ABG with no overt hypoxia or hypercapnia High suspicion for aspiration pneumonia -Given dysphagia secondary to quadriplegia Right pleural effusion -Seen on chest x-ray on admission, ultrasound performed in anticipation for thoracentesis with no identified fluid pocket effusion likely consolidated/atelectatic lung P: Daily reassessment of need for diuresing appears euvolemic on exam Continue supplemental oxygen, wean to 3 L nasal cannula this a.m. Encourage frequent and adequate pulmonary hygiene CoughAssist as needed As needed bronchodilators Mobilize as able, encourage upright position as much as  possible Remains on empiric antibiotics, de-escalate per primary Frequent reassessment of swallow per SLP Given quadriplegia and respiratory  weakness patient remains at high risk for intubation  Rest of acute and chronic conditions managed by primary team  Pulmonary medicine will be available as needed, please call for any further needs  Labs   CBC: Recent Labs  Lab 05/03/20 1243 05/03/20 1321 05/04/20 0001 05/05/20 0158 05/06/20 0444 05/07/20 0101 05/08/20 0318  WBC 19.4*   < > 20.5* 16.6* 17.6* 16.0* 13.6*  NEUTROABS 16.8*  --   --  14.9* 15.7* 14.4* 11.7*  HGB 14.8   < > 12.9* 12.6* 13.3 13.1 12.5*  HCT 47.4   < > 40.5 39.9 42.3 42.0 40.5  MCV 92.2   < > 89.0 91.3 90.2 91.5 93.3  PLT 133*   < > 117* 127* 152 146* 130*   < > = values in this interval not displayed.    Basic Metabolic Panel: Recent Labs  Lab 05/04/20 0001 05/04/20 0001 05/05/20 0158 05/05/20 0158 05/06/20 0444 05/07/20 0101 05/07/20 1157 05/07/20 1959 05/08/20 0318  NA 145   < > 150*   < > 151* 158* 156* 150* 149*  K 3.8  --  3.7  --  3.5 3.7  --   --  2.9*  CL 113*  --  117*  --  114* 117*  --   --  116*  CO2 25  --  26  --  27 28  --   --  27  GLUCOSE 167*  --  127*  --  144* 139*  --   --  210*  BUN 19  --  19  --  22 27*  --   --  22  CREATININE 0.82  --  0.96  --  1.06 1.10  --   --  1.06  CALCIUM 8.0*  --  8.2*  --  8.2* 8.5*  --   --  8.4*  MG  --   --  2.1  --  2.1 2.2  --   --   --   PHOS  --   --   --   --  3.3  --   --   --   --    < > = values in this interval not displayed.   GFR: Estimated Creatinine Clearance: 67.1 mL/min (by C-G formula based on SCr of 1.06 mg/dL). Recent Labs  Lab 05/03/20 1538 05/04/20 0001 05/04/20 0821 05/04/20 1117 05/05/20 0158 05/05/20 1442 05/06/20 0444 05/07/20 0101 05/08/20 0318  WBC  --    < >  --   --  16.6*  --  17.6* 16.0* 13.6*  LATICACIDVEN 2.5*  --  1.4 2.1*  --  1.8  --   --   --    < > = values in this interval not displayed.    Liver Function Tests: Recent Labs  Lab 05/03/20 1243 05/05/20 0158  AST 81* 50*  ALT 157* 89*  ALKPHOS 90 81  BILITOT 1.7* 1.0  PROT  6.2* 5.3*  ALBUMIN 2.2* 1.6*   No results for input(s): LIPASE, AMYLASE in the last 168 hours. No results for input(s): AMMONIA in the last 168 hours.  ABG    Component Value Date/Time   PHART 7.504 (H) 05/03/2020 2303   PCO2ART 34.2 05/03/2020 2303   PO2ART 66.5 (L) 05/03/2020 2303   HCO3 26.9 05/03/2020 2303   TCO2 33 (H) 05/03/2020 1321   O2SAT 94.3 05/03/2020 2303  Coagulation Profile: Recent Labs  Lab 05/03/20 1243  INR 1.7*    Cardiac Enzymes: No results for input(s): CKTOTAL, CKMB, CKMBINDEX, TROPONINI in the last 168 hours.  HbA1C: No results found for: HGBA1C  CBG: Recent Labs  Lab 05/04/20 0755 05/06/20 2000  GLUCAP 115* 128*    Signature Delfin Gant, NP-C [facility] Contact / Pager information can be found on Amion  05/08/2020, 7:42 AM

## 2020-05-08 NOTE — Progress Notes (Signed)
PROGRESS NOTE    Shannon [name]  WGN:[id number] DOB: 08/01/1945 DOA: 05/03/2020 PCP: Shannon [name], Philip H, MD   Brief Narrative:  Shannon [name] is a 74 y.o. 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  At the rehab patient sustained a mechanical fall, suffering cervical fracture and cervical epidural hematoma, he was admitted on 10/29 to 04/28/2020 with a working diagnosis of cervical fracture with surgical epidural hematoma, quadriparesis and central cord syndrome.  He underwent revision of his fusion and evacuation of epidural hematoma.  Patient was readmitted with generalized malaise, cough and congestion and was diagnosed with severe sepsis and acute hypoxic respiratory failure secondary to aspiration pneumonia as well as UTI.  PCCM on board.  Assessment & Plan:   Principal Problem:   Sepsis secondary to UTI [facility](HCC) Active Problems:   Cervical spondylosis with myelopathy and radiculopathy   Quadriparesis (HCC)   Benign essential HTN   Cervical myelopathy (HCC)   Urinary retention   Chronic indwelling Foley catheter   Pressure injury of skin   Protein-calorie malnutrition, severe   Respiration abnormal   Respiratory distress   1. Severe sepsis due to urinary tract infection/aspiration pneumonia, present on admission, related to chronic indwelling Foley catheter: Patient meets sepsis criteria due to fever, tachycardia, tachypnea and leukocytosis and severe sepsis due to hypoxia and lactic acid of 2.7. Currently requiring about 5 L of oxygen. PCCM on board. Aspiration pneumonia secondary to dysphagia due to recent cervical vertebrae surgery. Seen by SLP. Moderate aspiration risk, remains on clear liquids. until further recommendations.  Lactic acidosis resolved. PCCM tried thoracentesis on 05/05/2020 but he had  consolidated/atelectatic lung and no fluid.  Patient remains at very high risk of intubation.  He cannot control his secretions.  Discussed with daughter by myself and PCCM and she prefers to continue full code.  Urine culture growing Klebsiella pneumonia, mostly sensitive.   He is much more alert today but still requiring 3 L oxygen.  SLP has advanced his diet to full liquid diet.  Patient is much more alert and oriented today.  He is little more talkative.  Denied any pain or shortness of breath.  Oxygen demand is coming down as well.  We will continue to try to wean oxygen.  Completed 5 days of azithromycin.  Continue cefepime and Flagyl for total 7 days.  2.  Hypernatremia: Significant improvement.  Down to 149 sodium today.  Will stop IV dextrose fluids.  3.  Quadriplegia due to recent cervical vertebrae fracture.  Continue aspiration and fall precautions. PT/OT/SLP on board.  4.  Stage I-II sacrum decubitus ulcer.  Present on admission, continue local wound care. Consult wound care team.   5. HTN.  Blood pressure on the lower side.  Continue to hold amlodipine.  6. BPH. Continue with tamsulosin. Foley in place.   7. Thrombocytopenia: Likely sepsis induced.  Resolved.  8.  Severe protein calorie malnutrition: Dietitian on board.  DVT prophylaxis: SCDs Start: 05/03/20 1507   Code Status: Full Code  Family Communication: None at bedside.  Discussed with daughter day before yesterday.  We will call her later today.  Status is: Inpatient  Remains inpatient appropriate because:Hemodynamically unstable   Dispo: The patient is from: SNF              Anticipated d/c is to: SNF  Anticipated d/c date is: 1 to 2 days.              Patient currently is not medically stable to d/c.        Estimated body mass index is 25.33 kg/m as calculated from the following:   Height as of this encounter: 6' (1.829 m).   Weight as of this encounter: 84.7 kg.  Pressure Injury  05/03/20 Sacrum Right;Left;Medial Stage 2 -  Partial thickness loss of dermis presenting as a shallow open injury with a red, pink wound bed without slough. (Active)  05/03/20 2023  Location: Sacrum  Location Orientation: Right;Left;Medial  Staging: Stage 2 -  Partial thickness loss of dermis presenting as a shallow open injury with a red, pink wound bed without slough.  Wound Description (Comments):   Present on Admission: Yes     Nutritional status:  Nutrition Problem: Severe Malnutrition Etiology: chronic illness   Signs/Symptoms: moderate fat depletion, severe muscle depletion, percent weight loss (17.4% weight loss in less than 4 months) Percent weight loss: 17.4 %   Interventions: Magic cup, Hormel Shake    Consultants:   PCCM  Procedures:   None  Antimicrobials:  Anti-infectives (From admission, onward)   Start     Dose/Rate Route Frequency Ordered Stop   05/04/20 1800  cefTRIAXone (ROCEPHIN) 2 g in sodium chloride 0.9 % 100 mL IVPB  Status:  Discontinued        2 g 200 mL/hr over 30 Minutes Intravenous Every 24 hours 05/03/20 1905 05/04/20 0814   05/04/20 1300  azithromycin (ZITHROMAX) 500 mg in sodium chloride 0.9 % 250 mL IVPB  Status:  Discontinued        500 mg 250 mL/hr over 60 Minutes Intravenous Every 24 hours 05/03/20 2348 05/08/20 1048   05/04/20 1000  ceFEPIme (MAXIPIME) 2 g in sodium chloride 0.9 % 100 mL IVPB        2 g 200 mL/hr over 30 Minutes Intravenous Every 8 hours 05/04/20 0832 05/09/20 2359   05/04/20 1000  metroNIDAZOLE (FLAGYL) tablet 500 mg        500 mg Oral Every 8 hours 05/04/20 0832 05/09/20 2359   05/03/20 1800  cefTRIAXone (ROCEPHIN) 1 g in sodium chloride 0.9 % 100 mL IVPB  Status:  Discontinued        1 g 200 mL/hr over 30 Minutes Intravenous Every 24 hours 05/03/20 1754 05/03/20 1905   05/03/20 1300  cefTRIAXone (ROCEPHIN) 1 g in sodium chloride 0.9 % 100 mL IVPB  Status:  Discontinued        1 g 200 mL/hr over 30 Minutes  Intravenous Every 24 hours 05/03/20 1250 05/03/20 1905   05/03/20 1300  azithromycin (ZITHROMAX) 500 mg in sodium chloride 0.9 % 250 mL IVPB  Status:  Discontinued        500 mg 250 mL/hr over 60 Minutes Intravenous Every 24 hours 05/03/20 1250 05/03/20 1754   05/03/20 1245  levofloxacin (LEVAQUIN) IVPB 750 mg  Status:  Discontinued        750 mg 100 mL/hr over 90 Minutes Intravenous  Once 05/03/20 1243 05/03/20 1250         Subjective: Seen and examined.  Definitely much more alert and able to talk today.  Denied any pain or shortness of breath.  Looks very comfortable.  Objective: Vitals:   05/08/20 0900 05/08/20 0942 05/08/20 0945 05/08/20 1200  BP:   (!) 139/99 (!) 99/58  Pulse: 95 (!) 101 (!) 103  Resp: (!) Temp:    97.9 F (36.6 C)  TempSrc:    Axillary  SpO2: 94% 94% 95% 96%  Weight:      Height:        Intake/Output Summary (Last 24 hours) at 05/08/2020 1502 Last data filed at 05/08/2020 1400 Gross per 24 hour  Intake 3517.53 ml  Output 650 ml  Net 2867.53 ml   Filed Weights   05/03/20 2120  Weight: 84.7 kg    Examination: General exam: Appears calm and comfortable  Respiratory system: Clear to auscultation with some diminished breath sounds. Respiratory effort normal. Cardiovascular system: S1 & S2 heard, RRR. No JVD, murmurs, rubs, gallops or clicks. No pedal edema. Gastrointestinal system: Abdomen is nondistended, soft and nontender. No organomegaly or masses felt. Normal bowel sounds heard. Central nervous system: Alert and oriented.  1/5 power in all 4 extremities. Extremities: Symmetric 5 x 5 power. Skin: No rashes, lesions or ulcers.  Psychiatry: Judgement and insight appear normal. Mood & affect appropriate.    Data Reviewed: I have personally reviewed following labs and imaging studies  CBC: Recent Labs  Lab 05/03/20 1243 05/03/20 1321 05/04/20 0001 05/05/20 0158 05/06/20 0444 05/07/20 0101 05/08/20 0318  WBC 19.4*   < >  20.5* 16.6* 17.6* 16.0* 13.6*  NEUTROABS 16.8*  --   --  14.9* 15.7* 14.4* 11.7*  HGB 14.8   < > 12.9* 12.6* 13.3 13.1 12.5*  HCT 47.4   < > 40.5 39.9 42.3 42.0 40.5  MCV 92.2   < > 89.0 91.3 90.2 91.5 93.3  PLT 133*   < > 117* 127* 152 146* 130*   < > = values in this interval not displayed.   Basic Metabolic Panel: Recent Labs  Lab 05/04/20 0001 05/04/20 0001 05/05/20 0158 05/05/20 0158 05/06/20 0444 05/07/20 0101 05/07/20 1157 05/07/20 1959 05/08/20 0318  NA 145   < > 150*   < > 151* 158* 156* 150* 149*  K 3.8  --  3.7  --  3.5 3.7  --   --  2.9*  CL 113*  --  117*  --  114* 117*  --   --  116*  CO2 25  --  26  --  27 28  --   --  27  GLUCOSE 167*  --  127*  --  144* 139*  --   --  210*  BUN 19  --  19  --  22 27*  --   --  22  CREATININE 0.82  --  0.96  --  1.06 1.10  --   --  1.06  CALCIUM 8.0*  --  8.2*  --  8.2* 8.5*  --   --  8.4*  MG  --   --  2.1  --  2.1 2.2  --   --   --   PHOS  --   --   --   --  3.3  --   --   --   --    < > = values in this interval not displayed.   GFR: Estimated Creatinine Clearance: 67.1 mL/min (by C-G formula based on SCr of 1.06 mg/dL). Liver Function Tests: Recent Labs  Lab 05/03/20 1243 05/05/20 0158  AST 81* 50*  ALT 157* 89*  ALKPHOS 90 81  BILITOT 1.7* 1.0  PROT 6.2* 5.3*  ALBUMIN 2.2* 1.6*   No results for input(s): LIPASE, AMYLASE in the last 168 hours.  No results for input(s): AMMONIA in the last 168 hours. Coagulation Profile: Recent Labs  Lab 05/03/20 1243  INR 1.7*   Cardiac Enzymes: No results for input(s): CKTOTAL, CKMB, CKMBINDEX, TROPONINI in the last 168 hours. BNP (last 3 results) No results for input(s): PROBNP in the last 8760 hours. HbA1C: No results for input(s): HGBA1C in the last 72 hours. CBG: Recent Labs  Lab 05/04/20 0755 05/06/20 2000  GLUCAP 115* 128*   Lipid Profile: No results for input(s): CHOL, HDL, LDLCALC, TRIG, CHOLHDL, LDLDIRECT in the last 72 hours. Thyroid Function  Tests: No results for input(s): TSH, T4TOTAL, FREET4, T3FREE, THYROIDAB in the last 72 hours. Anemia Panel: No results for input(s): VITAMINB12, FOLATE, FERRITIN, TIBC, IRON, RETICCTPCT in the last 72 hours. Sepsis Labs: Recent Labs  Lab 05/03/20 1538 05/04/20 0821 05/04/20 1117 05/05/20 1442  LATICACIDVEN 2.5* 1.4 2.1* 1.8    Recent Results (from the past 240 hour(s))  Urine culture     Status: Abnormal   Collection Time: 05/03/20 12:43 PM   Specimen: In/Out Cath Urine  Result Value Ref Range Status   Specimen Description IN/OUT CATH URINE  Final   Special Requests   Final    NONE Performed at [facility], [address], [place], [place] [address]    Culture (A)  Final    >=100,000 COLONIES/mL KLEBSIELLA PNEUMONIAE >=100,000 COLONIES/mL ESCHERICHIA COLI    Report Status 05/06/2020 FINAL  Final   Organism ID, Bacteria KLEBSIELLA PNEUMONIAE (A)  Final   Organism ID, Bacteria ESCHERICHIA COLI (A)  Final      Susceptibility   Escherichia coli - MIC*    AMPICILLIN <=2 SENSITIVE Sensitive     CEFAZOLIN <=4 SENSITIVE Sensitive     CEFEPIME <=0.12 SENSITIVE Sensitive     CEFTRIAXONE <=0.25 SENSITIVE Sensitive     CIPROFLOXACIN <=0.25 SENSITIVE Sensitive     GENTAMICIN <=1 SENSITIVE Sensitive     IMIPENEM <=0.25 SENSITIVE Sensitive     NITROFURANTOIN <=16 SENSITIVE Sensitive     TRIMETH/SULFA <=20 SENSITIVE Sensitive     AMPICILLIN/SULBACTAM <=2 SENSITIVE Sensitive     PIP/TAZO <=4 SENSITIVE Sensitive     * >=100,000 COLONIES/mL ESCHERICHIA COLI   Klebsiella pneumoniae - MIC*    AMPICILLIN >=32 RESISTANT Resistant     CEFAZOLIN <=4 SENSITIVE Sensitive     CEFEPIME <=0.12 SENSITIVE Sensitive     CEFTRIAXONE <=0.25 SENSITIVE Sensitive     CIPROFLOXACIN <=0.25 SENSITIVE Sensitive     GENTAMICIN <=1 SENSITIVE Sensitive     IMIPENEM <=0.25 SENSITIVE Sensitive     NITROFURANTOIN 64 INTERMEDIATE Intermediate     TRIMETH/SULFA <=20 SENSITIVE Sensitive      AMPICILLIN/SULBACTAM 4 SENSITIVE Sensitive     PIP/TAZO <=4 SENSITIVE Sensitive     * >=100,000 COLONIES/mL KLEBSIELLA PNEUMONIAE  Blood Culture (routine x 2)     Status: None   Collection Time: 05/03/20  1:00 PM   Specimen: BLOOD  Result Value Ref Range Status   Specimen Description BLOOD SITE NOT SPECIFIED  Final   Special Requests   Final    BOTTLES DRAWN AEROBIC AND ANAEROBIC Blood Culture results may not be optimal due to an inadequate volume of blood received in culture bottles   Culture   Final    NO GROWTH 5 DAYS Performed at [facility], [address], [place], [place] [address]    Report Status 05/08/2020 FINAL  Final  Blood Culture (routine x 2)     Status: None  Collection Time: 05/03/20  1:16 PM   Specimen: BLOOD  Result Value Ref Range Status   Specimen Description BLOOD SITE NOT SPECIFIED  Final   Special Requests   Final    BOTTLES DRAWN AEROBIC AND ANAEROBIC Blood Culture results may not be optimal due to an inadequate volume of blood received in culture bottles   Culture   Final    NO GROWTH 5 DAYS Performed at [facility], [address], [place], [place] [address]    Report Status 05/08/2020 FINAL  Final  Respiratory Panel by RT PCR (Flu A&B, Covid) - Nasopharyngeal Swab     Status: None   Collection Time: 05/03/20 11:38 PM   Specimen: Nasopharyngeal Swab  Result Value Ref Range Status   SARS Coronavirus 2 by RT PCR NEGATIVE NEGATIVE Final    Comment: (NOTE) SARS-CoV-2 target nucleic acids are NOT DETECTED.  The SARS-CoV-2 RNA is generally detectable in upper respiratoy specimens during the acute phase of infection. The lowest concentration of SARS-CoV-2 viral copies this assay can detect is 131 copies/mL. A negative result does not preclude SARS-Cov-2 infection and should not be used as the sole basis for treatment or other patient management decisions. A negative result may occur with  improper specimen collection/handling, submission of  specimen other than nasopharyngeal swab, presence of viral mutation(s) within the areas targeted by this assay, and inadequate number of viral copies (<131 copies/mL). A negative result must be combined with clinical observations, patient history, and epidemiological information. The expected result is Negative.  Fact Sheet for Patients:  https://www.moore.com/  Fact Sheet for Healthcare Providers:  https://www.young.biz/  This test is no t yet approved or cleared by the [place] FDA and  has been authorized for detection and/or diagnosis of SARS-CoV-2 by FDA under an Emergency Use Authorization (EUA). This EUA will remain  in effect (meaning this test can be used) for the duration of the COVID-19 declaration under Section 564(b)(1) of the Act, 21 U.S.C. section 360bbb-3(b)(1), unless the authorization is terminated or revoked sooner.     Influenza A by PCR NEGATIVE NEGATIVE Final   Influenza B by PCR NEGATIVE NEGATIVE Final    Comment: (NOTE) The Xpert Xpress SARS-CoV-2/FLU/RSV assay is intended as an aid in  the diagnosis of influenza from Nasopharyngeal swab specimens and  should not be used as a sole basis for treatment. Nasal washings and  aspirates are unacceptable for Xpert Xpress SARS-CoV-2/FLU/RSV  testing.  Fact Sheet for Patients: https://www.moore.com/  Fact Sheet for Healthcare Providers: https://www.young.biz/  This test is not yet approved or cleared by the [place] FDA and  has been authorized for detection and/or diagnosis of SARS-CoV-2 by  FDA under an Emergency Use Authorization (EUA). This EUA will remain  in effect (meaning this test can be used) for the duration of the  Covid-19 declaration under Section 564(b)(1) of the Act, 21  U.S.C. section 360bbb-3(b)(1), unless the authorization is  terminated or revoked. Performed at [facility], [address],  [place], [place] [address]       Radiology Studies: DG Chest Port 1 View  Result Date: 05/08/2020 CLINICAL DATA:  Mucous plugging EXAM: PORTABLE CHEST 1 VIEW COMPARISON:  Two days ago FINDINGS: Low volume chest with opacity at both lung bases. Air bronchograms seen on the left. No edema, effusion, or pneumothorax. Normal heart size IMPRESSION: Continued volume loss with extensive atelectasis at the bases. Electronically Signed   By: Marnee Spring M.D.   On:  05/08/2020 06:46    Scheduled Meds: . Chlorhexidine Gluconate Cloth  6 each Topical Daily  . guaiFENesin  20 mL Oral Q6H  . metroNIDAZOLE  500 mg Oral Q8H  . pantoprazole (PROTONIX) IV  40 mg Intravenous Q24H  . polyethylene glycol  17 g Oral Daily  . potassium chloride  40 mEq Oral Q4H  . tamsulosin  0.4 mg Oral Daily   Continuous Infusions: . ceFEPime (MAXIPIME) IV 2 g (05/08/20 0900)     LOS: 5 days   Time spent: 28 minutes  Hughie Closs, MD [facility]  05/08/2020, 3:02 PM   To contact the attending provider between 7A-7P or the covering provider during after hours 7P-7A, please log into the web site [email or web address].

## 2020-05-08 NOTE — Progress Notes (Signed)
Patient has been diarrhea more than three times (large amount of watery brown stool). Sacrum dressing has been changed multiple times. Dr. Loney Loh notified and rectal tube insert order received.   Patient received bath and was tolerated well with rectal tube. New dressing changed on sacrum. Will continue to assess.

## 2020-05-09 DIAGNOSIS — N390 Urinary tract infection, site not specified: Secondary | ICD-10-CM | POA: Diagnosis not present

## 2020-05-09 DIAGNOSIS — A419 Sepsis, unspecified organism: Secondary | ICD-10-CM | POA: Diagnosis not present

## 2020-05-09 LAB — CBC WITH DIFFERENTIAL/PLATELET
Abs Immature Granulocytes: 0.09 10*3/uL — ABNORMAL HIGH (ref 0.00–0.07)
Basophils Absolute: 0 10*3/uL (ref 0.0–0.1)
Basophils Relative: 0 %
Eosinophils Absolute: 0.3 10*3/uL (ref 0.0–0.5)
Eosinophils Relative: 3 %
HCT: 38.9 % — ABNORMAL LOW (ref 39.0–52.0)
Hemoglobin: 11.8 g/dL — ABNORMAL LOW (ref 13.0–17.0)
Immature Granulocytes: 1 %
Lymphocytes Relative: 11 %
Lymphs Abs: 1.1 10*3/uL (ref 0.7–4.0)
MCH: 28.2 pg (ref 26.0–34.0)
MCHC: 30.3 g/dL (ref 30.0–36.0)
MCV: 93.1 fL (ref 80.0–100.0)
Monocytes Absolute: 0.5 10*3/uL (ref 0.1–1.0)
Monocytes Relative: 5 %
Neutro Abs: 8.3 10*3/uL — ABNORMAL HIGH (ref 1.7–7.7)
Neutrophils Relative %: 80 %
Platelets: 123 10*3/uL — ABNORMAL LOW (ref 150–400)
RBC: 4.18 MIL/uL — ABNORMAL LOW (ref 4.22–5.81)
RDW: 14.7 % (ref 11.5–15.5)
WBC: 10.3 10*3/uL (ref 4.0–10.5)
nRBC: 0 % (ref 0.0–0.2)

## 2020-05-09 LAB — SODIUM: Sodium: 152 mmol/L — ABNORMAL HIGH (ref 135–145)

## 2020-05-09 LAB — RESPIRATORY PANEL BY RT PCR (FLU A&B, COVID)
Influenza A by PCR: NEGATIVE
Influenza B by PCR: NEGATIVE
SARS Coronavirus 2 by RT PCR: NEGATIVE

## 2020-05-09 LAB — BASIC METABOLIC PANEL
Anion gap: 7 (ref 5–15)
BUN: 18 mg/dL (ref 8–23)
CO2: 26 mmol/L (ref 22–32)
Calcium: 8.4 mg/dL — ABNORMAL LOW (ref 8.9–10.3)
Chloride: 120 mmol/L — ABNORMAL HIGH (ref 98–111)
Creatinine, Ser: 1.05 mg/dL (ref 0.61–1.24)
GFR, Estimated: >60 mL/min (ref >60)
Glucose, Bld: 135 mg/dL — ABNORMAL HIGH (ref 70–99)
Potassium: 3.9 mmol/L (ref 3.5–5.1)
Sodium: 153 mmol/L — ABNORMAL HIGH (ref 135–145)

## 2020-05-09 MED ORDER — POTASSIUM CL IN DEXTROSE 5% 20 MEQ/L IV SOLN
20.0000 meq | INTRAVENOUS | Status: DC
  Administered 2020-05-09: 20 meq via INTRAVENOUS
  Filled 2020-05-09 (×3): qty 1000

## 2020-05-09 NOTE — Plan of Care (Signed)

## 2020-05-09 NOTE — Progress Notes (Signed)
PROGRESS NOTE    Shannon [name]  UUV:[id number] DOB: 1945-09-06 DOA: 05/03/2020 PCP: [name], MD   Brief Narrative:  Shannon [name] is a 74 y.o. 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  At the rehab patient sustained a mechanical fall, suffering cervical fracture and cervical epidural hematoma, he was admitted on 10/29 to 04/28/2020 with a working diagnosis of cervical fracture with surgical epidural hematoma, quadriparesis and central cord syndrome.  He underwent revision of his fusion and evacuation of epidural hematoma.  Patient was readmitted with generalized malaise, cough and congestion and was diagnosed with severe sepsis and acute hypoxic respiratory failure secondary to aspiration pneumonia as well as UTI.  PCCM on board seeing intermittently.  Assessment & Plan:   Principal Problem:   Sepsis secondary to UTI [facility]) Active Problems:   Cervical spondylosis with myelopathy and radiculopathy   Quadriparesis (HCC)   Benign essential HTN   Cervical myelopathy (HCC)   Urinary retention   Chronic indwelling Foley catheter   Pressure injury of skin   Protein-calorie malnutrition, severe   Respiration abnormal   Respiratory distress   1. Severe sepsis due to urinary tract infection/aspiration pneumonia, present on admission, related to chronic indwelling Foley catheter: Patient meets sepsis criteria due to fever, tachycardia, tachypnea and leukocytosis and severe sepsis due to hypoxia and lactic acid of 2.7. Currently requiring about 5 L of oxygen. PCCM on board. Aspiration pneumonia secondary to dysphagia due to recent cervical vertebrae surgery. Seen by SLP. Moderate aspiration risk, remains on clear liquids. until further recommendations.  Lactic acidosis resolved. PCCM tried thoracentesis on  05/05/2020 but he had consolidated/atelectatic lung and no fluid.  Patient remains at very high risk of intubation.  He cannot control his secretions.  Discussed with daughter by myself and PCCM and she prefers to continue full code.  Urine culture growing Klebsiella pneumonia, mostly sensitive. SLP has advanced his diet to full liquid diet.  Patient is more alert and oriented today.  More talkative.  Denies any complaints.  Requiring only 3 L of oxygen.  Completed 5 days of azithromycin.  Continue cefepime and Flagyl for total 7 days.  2.  Hypernatremia: Improved yesterday to 149 but went up to 153 again today.  We will restart him on dextrose fluids at 50 cc/h.  Monitor sodium closely.  3.  Quadriplegia due to recent cervical vertebrae fracture.  Continue aspiration and fall precautions. PT/OT/SLP on board.  4.  Stage I-II sacrum decubitus ulcer.  Present on admission, continue local wound care. Consult wound care team.   5. HTN.  Blood pressure on the lower side.  Continue to hold amlodipine.  6. BPH. Continue with tamsulosin. Foley in place.   7. Thrombocytopenia: Likely sepsis induced.  Resolved.  8.  Severe protein calorie malnutrition: Dietitian on board.  DVT prophylaxis: SCDs Start: 05/03/20 1507   Code Status: Full Code  Family Communication: None at bedside.  Discussed with the daughter.  I did explain to her that the patient might be ready for discharge tomorrow.  She is agreeable with that plan.  She was happy to hear that.  Status is: Inpatient  Remains inpatient appropriate because:Hemodynamically unstable   Dispo: The patient is from: SNF              Anticipated d/c is to: SNF  Anticipated d/c date is: 05/10/2020              Patient currently is not medically stable to d/c.        Estimated body mass index is 25.33 kg/m as calculated from the following:   Height as of this encounter: 6' (1.829 m).   Weight as of this encounter: 84.7  kg.  Pressure Injury 05/03/20 Sacrum Right;Left;Medial Stage 2 -  Partial thickness loss of dermis presenting as a shallow open injury with a red, pink wound bed without slough. (Active)  05/03/20 2023  Location: Sacrum  Location Orientation: Right;Left;Medial  Staging: Stage 2 -  Partial thickness loss of dermis presenting as a shallow open injury with a red, pink wound bed without slough.  Wound Description (Comments):   Present on Admission: Yes     Nutritional status:  Nutrition Problem: Severe Malnutrition Etiology: chronic illness   Signs/Symptoms: moderate fat depletion, severe muscle depletion, percent weight loss (17.4% weight loss in less than 4 months) Percent weight loss: 17.4 %   Interventions: Magic cup, Hormel Shake    Consultants:   PCCM  Procedures:   None  Antimicrobials:  Anti-infectives (From admission, onward)   Start     Dose/Rate Route Frequency Ordered Stop   05/04/20 1800  cefTRIAXone (ROCEPHIN) 2 g in sodium chloride 0.9 % 100 mL IVPB  Status:  Discontinued        2 g 200 mL/hr over 30 Minutes Intravenous Every 24 hours 05/03/20 1905 05/04/20 0814   05/04/20 1300  azithromycin (ZITHROMAX) 500 mg in sodium chloride 0.9 % 250 mL IVPB  Status:  Discontinued        500 mg 250 mL/hr over 60 Minutes Intravenous Every 24 hours 05/03/20 2348 05/08/20 1048   05/04/20 1000  ceFEPIme (MAXIPIME) 2 g in sodium chloride 0.9 % 100 mL IVPB        2 g 200 mL/hr over 30 Minutes Intravenous Every 8 hours 05/04/20 0832 05/09/20 2359   05/04/20 1000  metroNIDAZOLE (FLAGYL) tablet 500 mg        500 mg Oral Every 8 hours 05/04/20 0832 05/09/20 2359   05/03/20 1800  cefTRIAXone (ROCEPHIN) 1 g in sodium chloride 0.9 % 100 mL IVPB  Status:  Discontinued        1 g 200 mL/hr over 30 Minutes Intravenous Every 24 hours 05/03/20 1754 05/03/20 1905   05/03/20 1300  cefTRIAXone (ROCEPHIN) 1 g in sodium chloride 0.9 % 100 mL IVPB  Status:  Discontinued        1 g 200  mL/hr over 30 Minutes Intravenous Every 24 hours 05/03/20 1250 05/03/20 1905   05/03/20 1300  azithromycin (ZITHROMAX) 500 mg in sodium chloride 0.9 % 250 mL IVPB  Status:  Discontinued        500 mg 250 mL/hr over 60 Minutes Intravenous Every 24 hours 05/03/20 1250 05/03/20 1754   05/03/20 1245  levofloxacin (LEVAQUIN) IVPB 750 mg  Status:  Discontinued        750 mg 100 mL/hr over 90 Minutes Intravenous  Once 05/03/20 1243 05/03/20 1250         Subjective: Seen and examined.  Patient alert and oriented.  Denied any pain or complaints or any shortness of breath.  Objective: Vitals:   05/08/20 1928 05/08/20 2312 05/09/20 0323 05/09/20 0754  BP: 91/63 104/65 107/65 109/66  Pulse: 87 (!) 101 99 95  Resp: Temp: 97.8 F (36.6 C)  97.9 F (36.6 C) 97.8 F (36.6 C) 98.2 F (36.8 C)  TempSrc: Oral Oral Oral   SpO2: 93% 94% 94% 95%  Weight:      Height:        Intake/Output Summary (Last 24 hours) at 05/09/2020 1345 Last data filed at 05/09/2020 0325 Gross per 24 hour  Intake 220 ml  Output 700 ml  Net -480 ml   Filed Weights   05/03/20 2120  Weight: 84.7 kg    Examination:  General exam: Appears calm and comfortable with neck collar in place. Respiratory system: Diminished breath sounds bilaterally. Respiratory effort normal. Cardiovascular system: S1 & S2 heard, RRR. No JVD, murmurs, rubs, gallops or clicks. No pedal edema. Gastrointestinal system: Abdomen is nondistended, soft and nontender. No organomegaly or masses felt. Normal bowel sounds heard. Central nervous system: Alert and oriented.  1/5 power in all 4 extremities.  Husky voice. Extremities: Symmetric 5 x 5 power. Skin: No rashes, lesions or ulcers.  Psychiatry: Judgement and insight appear normal. Mood & affect appropriate.   Data Reviewed: I have personally reviewed following labs and imaging studies  CBC: Recent Labs  Lab 05/05/20 0158 05/06/20 0444 05/07/20 0101 05/08/20 0318  05/09/20 0336  WBC 16.6* 17.6* 16.0* 13.6* 10.3  NEUTROABS 14.9* 15.7* 14.4* 11.7* 8.3*  HGB 12.6* 13.3 13.1 12.5* 11.8*  HCT 39.9 42.3 42.0 40.5 38.9*  MCV 91.3 90.2 91.5 93.3 93.1  PLT 127* 152 146* 130* 123*   Basic Metabolic Panel: Recent Labs  Lab 05/05/20 0158 05/05/20 0158 05/06/20 0444 05/06/20 0444 05/07/20 0101 05/07/20 1157 05/07/20 1959 05/08/20 0318 05/09/20 0336  NA 150*   < > 151*   < > 158* 156* 150* 149* 153*  K 3.7  --  3.5  --  3.7  --   --  2.9* 3.9  CL 117*  --  114*  --  117*  --   --  116* 120*  CO2 26  --  27  --  28  --   --  27 26  GLUCOSE 127*  --  144*  --  139*  --   --  210* 135*  BUN 19  --  22  --  27*  --   --  22 18  CREATININE 0.96  --  1.06  --  1.10  --   --  1.06 1.05  CALCIUM 8.2*  --  8.2*  --  8.5*  --   --  8.4* 8.4*  MG 2.1  --  2.1  --  2.2  --   --   --   --   PHOS  --   --  3.3  --   --   --   --   --   --    < > = values in this interval not displayed.   GFR: Estimated Creatinine Clearance: 67.7 mL/min (by C-G formula based on SCr of 1.05 mg/dL). Liver Function Tests: Recent Labs  Lab 05/03/20 1243 05/05/20 0158  AST 81* 50*  ALT 157* 89*  ALKPHOS 90 81  BILITOT 1.7* 1.0  PROT 6.2* 5.3*  ALBUMIN 2.2* 1.6*   No results for input(s): LIPASE, AMYLASE in the last 168 hours. No results for input(s): AMMONIA in the last 168 hours. Coagulation Profile: Recent Labs  Lab 05/03/20 1243  INR 1.7*   Cardiac Enzymes: No results for input(s): CKTOTAL, CKMB, CKMBINDEX, TROPONINI in the last 168 hours. BNP (last 3 results) No results for input(s):  PROBNP in the last 8760 hours. HbA1C: No results for input(s): HGBA1C in the last 72 hours. CBG: Recent Labs  Lab 05/04/20 0755 05/06/20 2000  GLUCAP 115* 128*   Lipid Profile: No results for input(s): CHOL, HDL, LDLCALC, TRIG, CHOLHDL, LDLDIRECT in the last 72 hours. Thyroid Function Tests: No results for input(s): TSH, T4TOTAL, FREET4, T3FREE, THYROIDAB in the last 72  hours. Anemia Panel: No results for input(s): VITAMINB12, FOLATE, FERRITIN, TIBC, IRON, RETICCTPCT in the last 72 hours. Sepsis Labs: Recent Labs  Lab 05/03/20 1538 05/04/20 0821 05/04/20 1117 05/05/20 1442  LATICACIDVEN 2.5* 1.4 2.1* 1.8    Recent Results (from the past 240 hour(s))  Urine culture     Status: Abnormal   Collection Time: 05/03/20 12:43 PM   Specimen: In/Out Cath Urine  Result Value Ref Range Status   Specimen Description IN/OUT CATH URINE  Final   Special Requests   Final    NONE Performed at [facility], [address], [place], [place] [address]    Culture (A)  Final    >=100,000 COLONIES/mL KLEBSIELLA PNEUMONIAE >=100,000 COLONIES/mL ESCHERICHIA COLI    Report Status 05/06/2020 FINAL  Final   Organism ID, Bacteria KLEBSIELLA PNEUMONIAE (A)  Final   Organism ID, Bacteria ESCHERICHIA COLI (A)  Final      Susceptibility   Escherichia coli - MIC*    AMPICILLIN <=2 SENSITIVE Sensitive     CEFAZOLIN <=4 SENSITIVE Sensitive     CEFEPIME <=0.12 SENSITIVE Sensitive     CEFTRIAXONE <=0.25 SENSITIVE Sensitive     CIPROFLOXACIN <=0.25 SENSITIVE Sensitive     GENTAMICIN <=1 SENSITIVE Sensitive     IMIPENEM <=0.25 SENSITIVE Sensitive     NITROFURANTOIN <=16 SENSITIVE Sensitive     TRIMETH/SULFA <=20 SENSITIVE Sensitive     AMPICILLIN/SULBACTAM <=2 SENSITIVE Sensitive     PIP/TAZO <=4 SENSITIVE Sensitive     * >=100,000 COLONIES/mL ESCHERICHIA COLI   Klebsiella pneumoniae - MIC*    AMPICILLIN >=32 RESISTANT Resistant     CEFAZOLIN <=4 SENSITIVE Sensitive     CEFEPIME <=0.12 SENSITIVE Sensitive     CEFTRIAXONE <=0.25 SENSITIVE Sensitive     CIPROFLOXACIN <=0.25 SENSITIVE Sensitive     GENTAMICIN <=1 SENSITIVE Sensitive     IMIPENEM <=0.25 SENSITIVE Sensitive     NITROFURANTOIN 64 INTERMEDIATE Intermediate     TRIMETH/SULFA <=20 SENSITIVE Sensitive     AMPICILLIN/SULBACTAM 4 SENSITIVE Sensitive     PIP/TAZO <=4 SENSITIVE Sensitive     * >=100,000  COLONIES/mL KLEBSIELLA PNEUMONIAE  Blood Culture (routine x 2)     Status: None   Collection Time: 05/03/20  1:00 PM   Specimen: BLOOD  Result Value Ref Range Status   Specimen Description BLOOD SITE NOT SPECIFIED  Final   Special Requests   Final    BOTTLES DRAWN AEROBIC AND ANAEROBIC Blood Culture results may not be optimal due to an inadequate volume of blood received in culture bottles   Culture   Final    NO GROWTH 5 DAYS Performed at [facility], [address], [place], [place] [address]    Report Status 05/08/2020 FINAL  Final  Blood Culture (routine x 2)     Status: None   Collection Time: 05/03/20  1:16 PM   Specimen: BLOOD  Result Value Ref Range Status   Specimen Description BLOOD SITE NOT SPECIFIED  Final   Special Requests   Final    BOTTLES DRAWN AEROBIC AND ANAEROBIC Blood Culture results may  not be optimal due to an inadequate volume of blood received in culture bottles   Culture   Final    NO GROWTH 5 DAYS Performed at [facility], [address] [place], [place] [address]    Report Status 05/08/2020 FINAL  Final  Respiratory Panel by RT PCR (Flu A&B, Covid) - Nasopharyngeal Swab     Status: None   Collection Time: 05/03/20 11:38 PM   Specimen: Nasopharyngeal Swab  Result Value Ref Range Status   SARS Coronavirus 2 by RT PCR NEGATIVE NEGATIVE Final    Comment: (NOTE) SARS-CoV-2 target nucleic acids are NOT DETECTED.  The SARS-CoV-2 RNA is generally detectable in upper respiratoy specimens during the acute phase of infection. The lowest concentration of SARS-CoV-2 viral copies this assay can detect is 131 copies/mL. A negative result does not preclude SARS-Cov-2 infection and should not be used as the sole basis for treatment or other patient management decisions. A negative result may occur with  improper specimen collection/handling, submission of specimen other than nasopharyngeal swab, presence of viral mutation(s) within the areas targeted  by this assay, and inadequate number of viral copies (<131 copies/mL). A negative result must be combined with clinical observations, patient history, and epidemiological information. The expected result is Negative.  Fact Sheet for Patients:  https://www.moore.com/  Fact Sheet for Healthcare Providers:  https://www.young.biz/  This test is no t yet approved or cleared by the [place] FDA and  has been authorized for detection and/or diagnosis of SARS-CoV-2 by FDA under an Emergency Use Authorization (EUA). This EUA will remain  in effect (meaning this test can be used) for the duration of the COVID-19 declaration under Section 564(b)(1) of the Act, 21 U.S.C. section 360bbb-3(b)(1), unless the authorization is terminated or revoked sooner.     Influenza A by PCR NEGATIVE NEGATIVE Final   Influenza B by PCR NEGATIVE NEGATIVE Final    Comment: (NOTE) The Xpert Xpress SARS-CoV-2/FLU/RSV assay is intended as an aid in  the diagnosis of influenza from Nasopharyngeal swab specimens and  should not be used as a sole basis for treatment. Nasal washings and  aspirates are unacceptable for Xpert Xpress SARS-CoV-2/FLU/RSV  testing.  Fact Sheet for Patients: https://www.moore.com/  Fact Sheet for Healthcare Providers: https://www.young.biz/  This test is not yet approved or cleared by the [place] FDA and  has been authorized for detection and/or diagnosis of SARS-CoV-2 by  FDA under an Emergency Use Authorization (EUA). This EUA will remain  in effect (meaning this test can be used) for the duration of the  Covid-19 declaration under Section 564(b)(1) of the Act, 21  U.S.C. section 360bbb-3(b)(1), unless the authorization is  terminated or revoked. Performed at [facility], [address], [place], [place] [address]       Radiology Studies: DG Chest Port 1 View  Result Date:  05/08/2020 CLINICAL DATA:  Mucous plugging EXAM: PORTABLE CHEST 1 VIEW COMPARISON:  Two days ago FINDINGS: Low volume chest with opacity at both lung bases. Air bronchograms seen on the left. No edema, effusion, or pneumothorax. Normal heart size IMPRESSION: Continued volume loss with extensive atelectasis at the bases. Electronically Signed   By: Marnee Spring M.D.   On: 05/08/2020 06:46    Scheduled Meds: . Chlorhexidine Gluconate Cloth  6 each Topical Daily  . guaiFENesin  20 mL Oral Q6H  . metroNIDAZOLE  500 mg Oral Q8H  . pantoprazole (PROTONIX) IV  40 mg Intravenous Q24H  . polyethylene  glycol  17 g Oral Daily  . tamsulosin  0.4 mg Oral Daily   Continuous Infusions: . ceFEPime (MAXIPIME) IV 2 g (05/09/20 0915)     LOS: 6 days   Time spent: 32 minutes  Hughie Closs, MD [facility]  05/09/2020, 1:45 PM   To contact the attending provider between 7A-7P or the covering provider during after hours 7P-7A, please log into the web site [email or web address].

## 2020-05-09 NOTE — TOC Progression Note (Signed)
Transition of Care [facility]) - Progression Note    Patient Details  Name: Shannon [name] MRN: [id number] Date of Birth: 1946-03-20  Transition of Care [facility] CM/SW Contact  Lorri Frederick, LCSW Phone Number: 05/09/2020, 1:22 PM  Clinical Narrative:   CSW confirmed with [facility] that pt scheduled for DC tomorrow--they are able to receive pt tomorrow.    Expected Discharge Plan: Skilled Nursing Facility Barriers to Discharge: Continued Medical Work up  Expected Discharge Plan and Services Expected Discharge Plan: Skilled Nursing Facility     Post Acute Care Choice: Resumption of Paediatric nurse, Skilled Nursing Facility ([facility]) Living arrangements for the past 2 months: Skilled Nursing Facility, Single Family Home                                       Social Determinants of Health (SDOH) Interventions    Readmission Risk Interventions No flowsheet data found.

## 2020-05-10 ENCOUNTER — Encounter: Payer: Medicare Other | Admitting: Registered Nurse

## 2020-05-10 DIAGNOSIS — M4712 Other spondylosis with myelopathy, cervical region: Secondary | ICD-10-CM | POA: Diagnosis not present

## 2020-05-10 DIAGNOSIS — E43 Unspecified severe protein-calorie malnutrition: Secondary | ICD-10-CM

## 2020-05-10 DIAGNOSIS — A419 Sepsis, unspecified organism: Secondary | ICD-10-CM | POA: Diagnosis not present

## 2020-05-10 DIAGNOSIS — I10 Essential (primary) hypertension: Secondary | ICD-10-CM | POA: Diagnosis not present

## 2020-05-10 DIAGNOSIS — L89302 Pressure ulcer of unspecified buttock, stage 2: Secondary | ICD-10-CM

## 2020-05-10 DIAGNOSIS — G959 Disease of spinal cord, unspecified: Secondary | ICD-10-CM | POA: Diagnosis not present

## 2020-05-10 LAB — CBC WITH DIFFERENTIAL/PLATELET
Abs Immature Granulocytes: 0.07 10*3/uL (ref 0.00–0.07)
Basophils Absolute: 0 10*3/uL (ref 0.0–0.1)
Basophils Relative: 0 %
Eosinophils Absolute: 0.3 10*3/uL (ref 0.0–0.5)
Eosinophils Relative: 4 %
HCT: 36.9 % — ABNORMAL LOW (ref 39.0–52.0)
Hemoglobin: 11.2 g/dL — ABNORMAL LOW (ref 13.0–17.0)
Immature Granulocytes: 1 %
Lymphocytes Relative: 15 %
Lymphs Abs: 1.3 10*3/uL (ref 0.7–4.0)
MCH: 28.3 pg (ref 26.0–34.0)
MCHC: 30.4 g/dL (ref 30.0–36.0)
MCV: 93.2 fL (ref 80.0–100.0)
Monocytes Absolute: 0.5 10*3/uL (ref 0.1–1.0)
Monocytes Relative: 5 %
Neutro Abs: 6.6 10*3/uL (ref 1.7–7.7)
Neutrophils Relative %: 75 %
Platelets: 122 10*3/uL — ABNORMAL LOW (ref 150–400)
RBC: 3.96 MIL/uL — ABNORMAL LOW (ref 4.22–5.81)
RDW: 14.8 % (ref 11.5–15.5)
WBC: 8.8 10*3/uL (ref 4.0–10.5)
nRBC: 0 % (ref 0.0–0.2)

## 2020-05-10 LAB — BASIC METABOLIC PANEL
Anion gap: 5 (ref 5–15)
BUN: 18 mg/dL (ref 8–23)
CO2: 26 mmol/L (ref 22–32)
Calcium: 8.2 mg/dL — ABNORMAL LOW (ref 8.9–10.3)
Chloride: 121 mmol/L — ABNORMAL HIGH (ref 98–111)
Creatinine, Ser: 0.94 mg/dL (ref 0.61–1.24)
GFR, Estimated: >60 mL/min (ref >60)
Glucose, Bld: 145 mg/dL — ABNORMAL HIGH (ref 70–99)
Potassium: 3.8 mmol/L (ref 3.5–5.1)
Sodium: 152 mmol/L — ABNORMAL HIGH (ref 135–145)

## 2020-05-10 MED ORDER — CEPHALEXIN 500 MG PO CAPS
500.0000 mg | ORAL_CAPSULE | Freq: Three times a day (TID) | ORAL | Status: DC
  Administered 2020-05-10 – 2020-05-11 (×4): 500 mg via ORAL
  Filled 2020-05-10 (×4): qty 1

## 2020-05-10 MED ORDER — CEPHALEXIN 500 MG PO CAPS
500.0000 mg | ORAL_CAPSULE | Freq: Three times a day (TID) | ORAL | Status: DC
  Administered 2020-05-10: 500 mg via ORAL
  Filled 2020-05-10: qty 1

## 2020-05-10 MED ORDER — DEXTROSE 5 % IV SOLN: INTRAVENOUS | Status: DC

## 2020-05-10 MED ORDER — PANTOPRAZOLE SODIUM 40 MG PO TBEC
40.0000 mg | DELAYED_RELEASE_TABLET | Freq: Every day | ORAL | Status: DC
  Administered 2020-05-10 – 2020-05-11 (×2): 40 mg via ORAL
  Filled 2020-05-10 (×2): qty 1

## 2020-05-10 NOTE — Progress Notes (Signed)
Occupational Therapy Treatment Patient Details Name: Shannon [name] MRN: [id number] DOB: May 13, 1946 Today's Date: 05/10/2020    History of present illness 74 year old white male who presented 11/10 with RLL PNA and known history of  Quadriparesis.  He originally had a C3-4, C 4-5 ACDF on 04/05/2020.  He then went to CIR and was discharged.  Readmitted 04/21/2020 with progressive bilateral quadriplegia due to hematoma with subsequent decompression with D/c to [facility] 04/28/20   OT comments  Patient supine in bed and agreeable to OT session. Repositioned in chair position, engaged in BUE exercises and grooming tasks.  Patient requires total assist for hand over hand to wash face with R UE, wash hands. BUE exercises, completed unilaterally. Remains with increased strength on R side compared to L side, AAROM for full ROM; PROM for shoulder flexion to 90.  Patient following commands with increased consistency today, fades with fatigue requiring multimodal cueing during exercises.  Monitor hands for splinting needs, therapist elevated UEs on pillows to support edema reduction.  Will follow acutely, SNF remains appropriate.    Follow Up Recommendations  SNF;Supervision/Assistance - 24 hour    Equipment Recommendations  None recommended by OT    Recommendations for Other Services Other (comment) (palliative care)    Precautions / Restrictions Precautions Precautions: Fall;Cervical Precaution Booklet Issued: No Precaution Comments: pt not able to recieve instructions at this time  Required Braces or Orthoses: Cervical Brace Cervical Brace: Hard collar;At all times Restrictions Weight Bearing Restrictions: No       Mobility Bed Mobility               General bed mobility comments: repositoned in chair position  Transfers                 General transfer comment: unable    Balance                                           ADL either performed or assessed  with clinical judgement   ADL Overall ADL's : Needs assistance/impaired     Grooming: Wash/dry face;Wash/dry hands;Bed level;Total assistance Grooming Details (indicate cue type and reason): hand over hand assist for completion, sitting in chair position in bed                               General ADL Comments: limited to bed level, chair position      Vision   Additional Comments: able to open eyes consistently during session, able to look at NT and say "thats not [name]" (who is his niece), scans to L and R to find therapist    Perception     Praxis      Cognition Arousal/Alertness: Lethargic Behavior During Therapy: Flat affect Overall Cognitive Status: No family/caregiver present to determine baseline cognitive functioning Area of Impairment: Following commands;Problem solving;Awareness;Attention;Memory;Safety/judgement                   Current Attention Level: Focused Memory: Decreased short-term memory Following Commands: Follows one step commands inconsistently;Follows one step commands with increased time Safety/Judgement: Decreased awareness of safety;Decreased awareness of deficits Awareness: Intellectual Problem Solving: Slow processing;Requires verbal cues;Requires tactile cues;Decreased initiation;Difficulty sequencing General Comments: pt able to open eyes to command consistently today, following simple commands for UB exercises initally but fatigues quickly  Exercises Exercises: General Upper Extremity;Hand exercises General Exercises - Upper Extremity Shoulder Flexion: AAROM;Supine;5 reps;Both Elbow Flexion: AAROM;Both;5 reps;Supine Elbow Extension: AAROM;Both;5 reps;Supine Wrist Flexion: Both;5 reps;Supine;AAROM Wrist Extension: Both;5 reps;Supine;AAROM Digit Composite Flexion: Both;5 reps;Supine;AAROM Hand Exercises Forearm Supination: AAROM;Both;5 reps;Supine Forearm Pronation: AAROM;Both;5 reps;Supine   Shoulder  Instructions       General Comments      Pertinent Vitals/ Pain       Pain Assessment: Faces Faces Pain Scale: No hurt  Home Living                                          Prior Functioning/Environment              Frequency  Min 2X/week        Progress Toward Goals  OT Goals(current goals can now be found in the care plan section)  Progress towards OT goals: Progressing toward goals  Acute Rehab OT Goals Patient Stated Goal: not stated this session  Plan Discharge plan remains appropriate;Frequency remains appropriate    Co-evaluation                 AM-PAC OT "6 Clicks" Daily Activity     Outcome Measure   Help from another person eating meals?: Total Help from another person taking care of personal grooming?: Total Help from another person toileting, which includes using toliet, bedpan, or urinal?: Total Help from another person bathing (including washing, rinsing, drying)?: Total Help from another person to put on and taking off regular upper body clothing?: Total Help from another person to put on and taking off regular lower body clothing?: Total 6 Click Score: 6    End of Session Equipment Utilized During Treatment: Cervical collar  OT Visit Diagnosis: Unsteadiness on feet (R26.81);Muscle weakness (generalized) (M62.81);Other symptoms and signs involving the nervous system (R29.898)   Activity Tolerance Patient tolerated treatment well   Patient Left in bed;with call bell/phone within reach;with bed alarm set   Nurse Communication Mobility status        Time: 6195-0932 OT Time Calculation (min): 18 min  Charges: OT General Charges $OT Visit: 1 Visit OT Treatments $Self Care/Home Management : 8-22 mins  Barry Brunner, OT Acute Rehabilitation Services Pager [phone] Office [phone]    Chancy Milroy 05/10/2020, 3:25 PM

## 2020-05-10 NOTE — Plan of Care (Signed)
  Problem: Fluid Volume: Goal: Hemodynamic stability will improve Outcome: Not Progressing   Problem: Clinical Measurements: Goal: Diagnostic test results will improve Outcome: Not Progressing Goal: Signs and symptoms of infection will decrease Outcome: Not Progressing   Problem: Respiratory: Goal: Ability to maintain adequate ventilation will improve Outcome: Not Progressing   Problem: Education: Goal: Knowledge of General Education information will improve Description: Including pain rating scale, medication(s)/side effects and non-pharmacologic comfort measures Outcome: Not Progressing   Problem: Health Behavior/Discharge Planning: Goal: Ability to manage health-related needs will improve Outcome: Not Progressing   Problem: Clinical Measurements: Goal: Ability to maintain clinical measurements within normal limits will improve Outcome: Not Progressing Goal: Will remain free from infection Outcome: Not Progressing Goal: Diagnostic test results will improve Outcome: Not Progressing Goal: Respiratory complications will improve Outcome: Not Progressing Goal: Cardiovascular complication will be avoided Outcome: Not Progressing   

## 2020-05-10 NOTE — Discharge Summary (Signed)
Physician Discharge Summary  Shannon [name] WGN:[id number] DOB: 12-Nov-1945 DOA: 05/03/2020  PCP: Shannon [name], Shannon H, MD  Admit date: 05/03/2020 Discharge date: 05/11/2020  Admitted From: SNF  Disposition:  SNF   Recommendations for Outpatient Follow-up and new medication changes:  1. Follow up with Dr. Milinda CaveMcGowen in 7 days.  2. 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  3. Attempt voiding trial as outpatient.  4. Continue with mechanical compression devices for DVT prophylaxis.   Home Health: na   Equipment/Devices: na    Discharge Condition: stable  CODE STATUS: full  Diet recommendation:   Brief/Interim Summary: [name] will be hospitalized with a working diagnosis of sepsis due to urinary tract infection (tachycardia, fever and leukocytosis), related to chronic indwelling Foley catheter, present on admission.  74 year old male with cervical injury s/p discectomy, decompression and hematoma evacuation over the last month.  Reports with 4 days of not feeling well, along with cough and congestion.  He has shown difficulty swallowing. On the day of admission he was found in respiratory distress and transferred to the hospital.  On his initial physical examination his blood pressure 115/75, temperature 38.1 C, respiratory rate 25, heart rate 123, oxygen saturation 92% on room air.  He had a rigid cervical collar in place, his lungs had rhonchi bilaterally, heart S1-S2, present, tachycardic, soft abdomen, no lower extremity edema. Sodium 147, potassium 4.2, chloride 111, bicarb 27, glucose 158, BUN 20, creatinine 1.15, AST 81, ALT 157, lactic acid 2.7, white count 19.4, hemoglobin 14.8, hematocrit 47.8, platelets 133.  SARS COVID-19 negative.  Urinalysis more than 50 white cells, 21-50 red cells, specific gravity 1.018, 30 protein. Chest radiograph with right hemidiaphragm elevation, bibasilar atelectasis.  Head and cervical CT scan no acute changes.  CT chest  with opacification of the right middle, right lower and left lower lobe bronchi, with complete collapse of the right middle and right lower lobes, likely mucous plugging.   Patient had swallow evaluation and placed on aspiration precautions. Urine culture positive for Klebsiella pneumonia and E. coli.   1.  Sepsis due to urine tract infection, present on admission.  Related to chronic indwelling Foley catheter.  He was admitted to the progressive care unit, he received broad-spectrum antibiotic therapy with cefepime and metronidazole.  His urine culture was positive for Klebsiella and E. coli, both sensitive to cephalosporins.  Patient will complete therapy with cephalexin for 4 more days.  Continue Foley catheter care, exchange every 4 weeks. Attempt voiding trial as outpatient.   2.  Right middle lobe, right lower lobe collapse, mucous plugging, possible aspiration pneumonia.  He was placed on aspiration precautions, speech and swallow evaluation recommended nectar thick liquids. Bedside ultrasonography no significant effusion on the right side.  It was recommended aggressive pulmonary hygiene, and further aspiration precautions.  Oxygenation at discharge 06% on 3 L/min per [place].   3.  Hypernatremia.  Patient developed hypernatremia, peak 153, ADH received D5 water per IV. Recommend close follow-up on electrolytes.  Continue to encourage p.o. intake. At discharge Na is 148 with K at 3,6 and bicarbonate at 28, cr 0,86 and bun 15.   4.  Quadriplegia.  Patient will return to skilled nursing facility, continue physical therapy/Occupational Therapy.  5.  Stage I-II sacral decubitus ulcer.  Continue local skin care, encourage mobility.  6.  BPH.  Continue Foley catheter care, exchange every 4 weeks.  7.  Hypertension.  Systolic pressure is well controlled, continue holding antihypertensive agents.  8.  Thrombocytopenia.  Likely reactive.  9.  Severe protein calorie malnutrition.  Continue  nutritional supplements.  Discharge Diagnoses:  Principal Problem:   Sepsis secondary to UTI Shannon [name]) Active Problems:   Cervical spondylosis with myelopathy and radiculopathy   Quadriparesis (HCC)   Benign essential HTN   Cervical myelopathy (HCC)   Urinary retention   Chronic indwelling Foley catheter   Pressure injury of skin   Protein-calorie malnutrition, severe   Respiration abnormal   Respiratory distress    Discharge Instructions   Allergies as of 05/11/2020      Reactions   Iodine Swelling   Penicillins Swelling   Facial swelling, itchy throat      Medication List    STOP taking these medications   amLODipine 10 MG tablet Commonly known as: NORVASC   apixaban 2.5 MG Tabs tablet Commonly known as: ELIQUIS   docusate sodium 100 MG capsule Commonly known as: COLACE   oxyCODONE 5 MG immediate release tablet Commonly known as: Oxy IR/ROXICODONE     TAKE these medications   acetaminophen 500 MG tablet Commonly known as: TYLENOL Take 1,000 mg by mouth in the morning, at noon, and at bedtime.   cephALEXin 500 MG capsule Commonly known as: KEFLEX Take 1 capsule (500 mg total) by mouth every 8 (eight) hours for 3 days. Treat through 11/20   food thickener Powd Commonly known as: SIMPLYTHICK Take 2 packets by mouth as needed.   ibuprofen 600 MG tablet Commonly known as: ADVIL Take 1 tablet (600 mg total) by mouth every 6 (six) hours as needed for fever.   pantoprazole 40 MG tablet Commonly known as: PROTONIX Take 1 tablet (40 mg total) by mouth at bedtime.   polyethylene glycol 17 g packet Commonly known as: MIRALAX / GLYCOLAX Take 17 g by mouth daily.   tamsulosin 0.4 MG Caps capsule Commonly known as: FLOMAX Take 1 capsule (0.4 mg total) by mouth daily.            Discharge Care Instructions  (From admission, onward)         Start     Ordered   05/11/20 0000  Discharge wound care:       Comments: Wound care to coccygeal area DTPI:  Cleanse with NS pat dry. Cover affected area with single layer of xeroform gauze [place] [phone]), Top with dry gauze and cover with silicone foam for sacrum. Position so that "point' is orientated toward head and away from anus so the maximum coverage of buttock attained. Turn patient to the side and minimize time in the supine position. Keep HOB at or below 30 degree angle. Peel back silicone ofam tiwice daily to assess area and replace xeroform gauze.   05/11/20 0949          Allergies  Allergen Reactions  . Iodine Swelling  . Penicillins Swelling    Facial swelling, itchy throat    Consultations:  Pulmonary    Procedures/Studies: DG Cervical Spine 1 View  Result Date: 04/26/2020 CLINICAL DATA:  Status post surgical spinal fusion. EXAM: DG CERVICAL SPINE - 1 VIEW COMPARISON:  Radiographs of the cervical spine 04/22/2020. FINDINGS: A single lateral view radiograph of the cervical spine is submitted. Redemonstrated sequela of prior C3-C5 ACDF with ventral plate and screw fixation. Redemonstrated interbody device at the C4-C5 level. IMPRESSION: Single lateral view radiograph of the cervical spine as above. Electronically Signed   By: Jackey Loge DO   On: 04/26/2020 14:58   DG Cervical  Spine 1 View  Result Date: 04/22/2020 CLINICAL DATA:  Epidural hematoma, hardware revision EXAM: DG CERVICAL SPINE - 1 VIEW COMPARISON:  04/05/2020 FINDINGS: Lateral intraoperative view again demonstrates anterior fusion at C3-C5 with plate screw fixation. The C4-C5 implant has been replaced. IMPRESSION: Intraoperative view of anterior fusion. C4-C5 interbody implant has been replaced. Electronically Signed   By: Guadlupe Spanish M.D.   On: 04/22/2020 08:15   CT Head Wo Contrast  Result Date: 05/03/2020 CLINICAL DATA:  Facial trauma after multiple falls. EXAM: CT HEAD WITHOUT CONTRAST CT CERVICAL SPINE WITHOUT CONTRAST TECHNIQUE: Multidetector CT imaging of the head and cervical spine was performed following  the standard protocol without intravenous contrast. Multiplanar CT image reconstructions of the cervical spine were also generated. COMPARISON:  April 21, 2020. FINDINGS: CT HEAD FINDINGS Brain: Mild chronic ischemic white matter disease is noted. No mass effect or midline shift is noted. Ventricular size is within normal limits. There is no evidence of mass lesion, hemorrhage or acute infarction. Vascular: No hyperdense vessel or unexpected calcification. Skull: Normal. Negative for fracture or focal lesion. Sinuses/Orbits: No acute finding. Other: None. CT CERVICAL SPINE FINDINGS Alignment: Normal. Skull base and vertebrae: No acute fracture is noted. Postsurgical changes are seen involving the C3, C4 and C5 vertebral bodies. Soft tissues and spinal canal: No prevertebral fluid or swelling. No visible canal hematoma. Disc levels: Status post surgical anterior fusion of C3-4 and C4-5. There is fusion of the C5-6 and C6-7 disc spaces secondary to degenerative change. Upper chest: Negative. Other: None. IMPRESSION: 1. Mild chronic ischemic white matter disease. No acute intracranial abnormality seen. 2. Postsurgical and degenerative changes as described above. No acute abnormality seen in the cervical spine. Electronically Signed   By: Lupita Raider M.D.   On: 05/03/2020 14:41   CT Head Wo Contrast  Result Date: 04/21/2020 CLINICAL DATA:  74 year old male with head trauma. EXAM: CT HEAD WITHOUT CONTRAST CT CERVICAL SPINE WITHOUT CONTRAST TECHNIQUE: Multidetector CT imaging of the head and cervical spine was performed following the standard protocol without intravenous contrast. Multiplanar CT image reconstructions of the cervical spine were also generated. COMPARISON:  Cervical spine MRI dated 03/08/2020 and radiograph dated 04/05/2020. FINDINGS: CT HEAD FINDINGS Brain: There is mild age-related atrophy and chronic microvascular ischemic changes. There is no acute intracranial hemorrhage. No mass effect or  midline shift. No extra-axial fluid collection. Vascular: No hyperdense vessel or unexpected calcification. Skull: No acute calvarial pathology. Postsurgical changes of the suboccipital decompression. Sinuses/Orbits: No acute finding. Other: None CT CERVICAL SPINE FINDINGS Alignment: No acute subluxation. Skull base and vertebrae: No acute fracture. Postsurgical changes versus incomplete bony fusion of the posterior ring of C1. Soft tissues and spinal canal: No prevertebral fluid or swelling. No visible canal hematoma. Disc levels: Postsurgical changes of C3-C5 ACDF. The fusion hardware is intact. There are multilevel degenerative changes with osteophyte. Upper chest: Negative. Other: None IMPRESSION: 1. No acute intracranial pathology. Mild age-related atrophy and chronic microvascular ischemic changes. 2. No acute/traumatic cervical spine pathology. 3. Postsurgical changes of C3-C5 ACDF. Electronically Signed   By: Elgie Collard M.D.   On: 04/21/2020 18:17   CT Chest Wo Contrast  Result Date: 05/03/2020 CLINICAL DATA:  Short of breath, fever, sepsis, multiple falls EXAM: CT CHEST WITHOUT CONTRAST TECHNIQUE: Multidetector CT imaging of the chest was performed following the standard protocol without IV contrast. COMPARISON:  05/03/2020 FINDINGS: Cardiovascular: Unenhanced imaging of the heart and great vessels demonstrates trace pericardial fluid. The heart is not  enlarged. Aneurysmal dilatation of the ascending thoracic aorta measuring up to 4.4 cm. Evaluation of the vascular lumen is limited without intravenous contrast. Mediastinum/Nodes: No enlarged mediastinal or axillary lymph nodes. Thyroid gland, trachea, and esophagus demonstrate no significant findings. Lungs/Pleura: There is diffuse bronchial wall thickening. Complete opacification of the bronchus intermedius and right middle and right lower lobe bronchi are seen, along with partial opacification of the left lower lobe bronchus. There is patchy  airspace disease within the right upper and left lower lobes. Complete consolidation of the right middle and right lower lobes with associated volume loss consistent with atelectasis. No effusion or pneumothorax. Upper Abdomen: Calcified gallstone is identified. The remainder of the upper abdomen is unremarkable. Musculoskeletal: Severe left shoulder osteoarthritis. No acute or destructive bony lesions. Reconstructed images demonstrate no additional findings. IMPRESSION: 1. Diffuse bronchial wall thickening, with patchy airspace disease of the right upper and left lower lobes, consistent with bronchopneumonia. 2. Opacification of the right middle, right lower, and left lower lobe bronchi, with complete collapse of the right middle and right lower lobes. This may be related to mucous plugging or underlying infection. 3. Trace pericardial fluid. 4. Cholelithiasis without cholecystitis. 5. 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 Recommend annual imaging followup by CTA or MRA. This recommendation follows 2010 ACCF/AHA/AATS/ACR/ASA/SCA/SCAI/SIR/STS/SVM Guidelines for the Diagnosis and Management of Patients with Thoracic Aortic Disease. Circulation. 2010; 121: Z610-R604. Aortic aneurysm NOS (ICD10-I71.9) Electronically Signed   By: Sharlet Salina M.D.   On: 05/03/2020 16:31   CT Cervical Spine Wo Contrast  Result Date: 05/03/2020 CLINICAL DATA:  Facial trauma after multiple falls. EXAM: CT HEAD WITHOUT CONTRAST CT CERVICAL SPINE WITHOUT CONTRAST TECHNIQUE: Multidetector CT imaging of the head and cervical spine was performed following the standard protocol without intravenous contrast. Multiplanar CT image reconstructions of the cervical spine were also generated. COMPARISON:  April 21, 2020. FINDINGS: CT HEAD FINDINGS Brain: Mild chronic ischemic white matter disease is noted. No mass effect or midline shift is noted. Ventricular size is within normal limits. There is no evidence  of mass lesion, hemorrhage or acute infarction. Vascular: No hyperdense vessel or unexpected calcification. Skull: Normal. Negative for fracture or focal lesion. Sinuses/Orbits: No acute finding. Other: None. CT CERVICAL SPINE FINDINGS Alignment: Normal. Skull base and vertebrae: No acute fracture is noted. Postsurgical changes are seen involving the C3, C4 and C5 vertebral bodies. Soft tissues and spinal canal: No prevertebral fluid or swelling. No visible canal hematoma. Disc levels: Status post surgical anterior fusion of C3-4 and C4-5. There is fusion of the C5-6 and C6-7 disc spaces secondary to degenerative change. Upper chest: Negative. Other: None. IMPRESSION: 1. Mild chronic ischemic white matter disease. No acute intracranial abnormality seen. 2. Postsurgical and degenerative changes as described above. No acute abnormality seen in the cervical spine. Electronically Signed   By: Lupita Raider M.D.   On: 05/03/2020 14:41   CT Cervical Spine Wo Contrast  Result Date: 04/21/2020 CLINICAL DATA:  74 year old male with head trauma. EXAM: CT HEAD WITHOUT CONTRAST CT CERVICAL SPINE WITHOUT CONTRAST TECHNIQUE: Multidetector CT imaging of the head and cervical spine was performed following the standard protocol without intravenous contrast. Multiplanar CT image reconstructions of the cervical spine were also generated. COMPARISON:  Cervical spine MRI dated 03/08/2020 and radiograph dated 04/05/2020. FINDINGS: CT HEAD FINDINGS Brain: There is mild age-related atrophy and chronic microvascular ischemic changes. There is no acute intracranial hemorrhage. No mass effect or midline shift. No extra-axial fluid collection.  Vascular: No hyperdense vessel or unexpected calcification. Skull: No acute calvarial pathology. Postsurgical changes of the suboccipital decompression. Sinuses/Orbits: No acute finding. Other: None CT CERVICAL SPINE FINDINGS Alignment: No acute subluxation. Skull base and vertebrae: No acute  fracture. Postsurgical changes versus incomplete bony fusion of the posterior ring of C1. Soft tissues and spinal canal: No prevertebral fluid or swelling. No visible canal hematoma. Disc levels: Postsurgical changes of C3-C5 ACDF. The fusion hardware is intact. There are multilevel degenerative changes with osteophyte. Upper chest: Negative. Other: None IMPRESSION: 1. No acute intracranial pathology. Mild age-related atrophy and chronic microvascular ischemic changes. 2. No acute/traumatic cervical spine pathology. 3. Postsurgical changes of C3-C5 ACDF. Electronically Signed   By: Elgie Collard M.D.   On: 04/21/2020 18:17   MR Cervical Spine W or Wo Contrast  Result Date: 04/21/2020 CLINICAL DATA:  Spinal cord injury, follow-up. EXAM: MRI CERVICAL SPINE WITHOUT AND WITH CONTRAST TECHNIQUE: Multiplanar and multiecho pulse sequences of the cervical spine, to include the craniocervical junction and cervicothoracic junction, were obtained without and with intravenous contrast. CONTRAST:  9mL GADAVIST GADOBUTROL 1 MMOL/ML IV SOLN COMPARISON:  04/21/2020 CT cervical spine. 03/08/2020 MRI cervical spine. FINDINGS: Alignment: Straightening of lordosis. Sequela of interval C3-5 ACDF. Grade 1 C2-3 retrolisthesis. Vertebrae: Diffuse bone marrow heterogeneity. No fracture or focal osseous lesion. Cord: Severe cord atrophy and T2 hyperintensity spanning the C2-T1 levels is unchanged. No abnormal cord enhancement. Posterior Fossa, vertebral arteries: No acute finding. Disc levels: Interbody spacers at the C3-5 levels. Multilevel desiccation and disc space loss at the remaining cervical levels. C2-3: Grade 1 retrolisthesis. Disc osteophyte complex partially effacing the ventral CSF containing spaces. Uncovertebral and facet hypertrophy. Patent spinal canal. Moderate to severe bilateral neural foraminal narrowing. C3-4: Sequela of fusion. Moderate to severe spinal canal narrowing. Mild-to-moderate bilateral neural  foraminal narrowing. C4-5: Sequela of fusion. Mild spinal canal narrowing. Moderate left and mild right neural foraminal narrowing. C5-6: Disc osteophyte complex with prominent left paracentral osteophyte abutting the ventral cord. Uncovertebral and facet hypertrophy. Mild spinal canal and right neural foraminal narrowing. Severe left neural foraminal narrowing. C6-7: Disc osteophyte complex with uncovertebral and facet hypertrophy. Dorsal osseous prominence partially effacing the ventral CSF containing spaces. Mild spinal canal and moderate to severe bilateral neural foraminal narrowing. C7-T1: Disc osteophyte complex with right predominant uncovertebral and facet hypertrophy. Central protrusion partially effacing the ventral CSF containing spaces. Bilateral facet hypertrophy. Mild spinal canal, severe right and mild-to-moderate left neural foraminal narrowing. Paraspinal tissues: Postsurgical appearance of the paraspinal soft tissues. No focal fluid collection. IMPRESSION: Sequela of C3-5 ACDF.  No acute fracture. Advanced multilevel spondylosis. Moderate to severe spinal canal narrowing most prominent at the C3-4 level is increased since prior exam. Cord atrophy and myelomalacia, unchanged. No focal cord edema or fluid collection. Electronically Signed   By: Stana Bunting M.D.   On: 04/21/2020 21:37   DG Chest Port 1 View  Result Date: 05/08/2020 CLINICAL DATA:  Mucous plugging EXAM: PORTABLE CHEST 1 VIEW COMPARISON:  Two days ago FINDINGS: Low volume chest with opacity at both lung bases. Air bronchograms seen on the left. No edema, effusion, or pneumothorax. Normal heart size IMPRESSION: Continued volume loss with extensive atelectasis at the bases. Electronically Signed   By: Marnee Spring M.D.   On: 05/08/2020 06:46   DG Chest Port 1 View  Result Date: 05/06/2020 CLINICAL DATA:  Shortness of breath EXAM: PORTABLE CHEST 1 VIEW COMPARISON:  May 05, 2020 FINDINGS: Persistent right pleural  effusion with  areas of atelectatic change in the lung bases noted. Heart size and pulmonary vascularity are normal. No adenopathy. No bone lesions. IMPRESSION: Stable right pleural effusion with bibasilar atelectasis. A degree of superimposed infiltrate in the right base cannot be excluded. Stable cardiac silhouette. Electronically Signed   By: Bretta Bang III M.D.   On: 05/06/2020 08:29   DG Chest Port 1 View  Result Date: 05/05/2020 CLINICAL DATA:  Respiratory distress. EXAM: PORTABLE CHEST 1 VIEW COMPARISON:  05/03/2020 FINDINGS: The cardiac silhouette, mediastinal and hilar contours are within normal limits and stable. Moderate-sized right pleural effusion with overlying atelectasis or infiltrate. Patchy left basilar infiltrate. IMPRESSION: Moderate-sized right pleural effusion with overlying atelectasis or infiltrate. Patchy left basilar infiltrate. Electronically Signed   By: Rudie Meyer M.D.   On: 05/05/2020 07:15   DG CHEST PORT 1 VIEW  Result Date: 05/04/2020 CLINICAL DATA:  Respiratory distress EXAM: PORTABLE CHEST 1 VIEW COMPARISON:  05/03/2020 FINDINGS: Unchanged right basilar consolidation. Cardiomediastinal contours and pleural spaces are normal. Left lung is clear. IMPRESSION: Unchanged right basilar consolidation. Electronically Signed   By: Deatra Robinson M.D.   On: 05/04/2020 00:07   DG Chest Port 1 View  Result Date: 05/03/2020 CLINICAL DATA:  Questionable sepsis. EXAM: PORTABLE CHEST 1 VIEW COMPARISON:  01/09/2020. FINDINGS: The heart size and mediastinal contours are within normal limits. Low lung volumes. Left basilar opacities. No visible pleural effusions or pneumothorax. Elevated right hemidiaphragm. No acute osseous abnormality. Bilateral shoulder degenerative change. Partially imaged cervical ACDF. IMPRESSION: Low lung volumes with left basilar opacities, which may represent atelectasis, aspiration and/or pneumonia. Dedicated PA and lateral radiographs could better  characterize if clinically indicated. Electronically Signed   By: Feliberto Harts MD   On: 05/03/2020 13:12   DG Swallowing Func-Speech Pathology  Result Date: 05/04/2020 Objective Swallowing Evaluation: Type of Study: Bedside Swallow Evaluation  Patient Details Name: ELVYN KROHN MRN: [id number] Date of Birth: December 27, 1945 Today's Date: 05/04/2020 Time: SLP Start Time (ACUTE ONLY): 1044 -SLP Stop Time (ACUTE ONLY): 1103 SLP Time Calculation (min) (ACUTE ONLY): 19 min Past Medical History: Past Medical History: Diagnosis Date . Asthma  . 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 Newell Coral, 1992-->surg at [facility].  Summer 2021->Cervicalgia,arm pain, hand atrophy RUE wkness, hyperreflex (Dr. 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 Hay fever  . Hypertension  . Osteoarthritis of both hands  Past Surgical History: Past Surgical History: Procedure Laterality Date . ANTERIOR CERVICAL DECOMP/DISCECTOMY FUSION N/A 04/05/2020  Procedure: ANTERIOR CERVICAL DECOMPRESSION/DISCECTOMY FUSION, INTERBODY PROSTHESIS, PLATE/SCREWS CERVICAL THREE-CERVICAL FOUR, CERVICAL FOUR- CERVICAL FIVE;  Surgeon: Tressie Stalker, MD;  Location: [facility] OR;  Service: Neurosurgery;  Laterality: N/A; . ANTERIOR CERVICAL DECOMP/DISCECTOMY FUSION N/A 04/22/2020  Procedure: Reexploration of anterior cervical wound for epidural hematoma;  Surgeon: Donalee Citrin, MD;  Location: [facility] OR;  Service: Neurosurgery;  Laterality: N/A; . BACK SURGERY   . INCISION AND DRAINAGE Left 2012  L hand infection . ROTATOR CUFF REPAIR Right   x 3 . SPINE SURGERY   HPI: 74 year old white male who presented with right lower lobe pneumonia and known history of  Quadriparesis.  He originally had a C3-4 Media 4-5 anterior cervical disc asked to me with decompression, C3 3-4 and C4-5 interbody arthrodesis with local autograft bone, anterior cervical  plating of C3-C5 on 04/05/2020.  He then went to rehab and was discharged.  On 04/21/2020 the  patient presented to the emergency room with progressive bilateral quadriplegia.  Patient went to the operating room and had decompression of a vertebral hematoma.  And been discharged on 11 10/2019 and readmitted on 05/03/2020 for increasing shortness of breath and productive cough.  Chest x-ray demonstrated a new right lower lobe infiltrate with compressive atelectasis, concerning for aspiration pneumonia.  Pt had MBSS during most recent admission on 04/25/2020 with recommendations for puree and thin liquids, advanced to Dys3 prior to discharge  Subjective: alert, cooperative, pleasant, participative. Assessment / Plan / Recommendation CHL IP CLINICAL IMPRESSIONS 05/04/2020 Clinical Impression Pt presents with moderate oropharyngeal dysphagia c/b decreased base of tongue retraction, delayed swallow inititation, incomplete laryngeal closure, reduced pharyngeal perstalsis, decreased UES opening, and diminished sensation. These deficits resulted in silent aspiration of thin liquid and nectar thick liquid by straw prior to the swallow. With nectar thick liquid by cup, only transient penetration was seen. There was significant pharyngeal residue with puree and solid consistencies which was reduced but not fully cleared with liquid wash. There is noticeable prevertebral edema in pharynx which appears to be impacting swallow function, but pt is able to acheive adequate epiglottic deflection. This study represents a marked decline in swallow function from MBSS on 04/25/20; however, from review of images in both studies, amount of edema appears comparable to naked eye without any accurate measurement, or comparing level of magnification during each study. Because of recent ACDF (11/13) and revision (10/29), and presence of cervical collar, SLP intervention options are limited outside of modification of diet. Pt cannot trial  compensatory strategies requiring change to head/neck positioning, and many swallow exercises cannot be executed in this position. Pt may benefit from some tongue strengthening exercises to improve swallow drive and decrease vallecula residue. Recommend initiating full liquid diet with nectar thick liquids, by cup sip only. NO STRAWS. Please crush medications if permissible. Called neice, [name], and shared results of today's study which she will pass on to pt's daughter who will be coming to town tomorrow. SLP Visit Diagnosis -- Attention and concentration deficit following -- Frontal lobe and executive function deficit following -- Impact on safety and function --   CHL IP TREATMENT RECOMMENDATION 05/04/2020 Treatment Recommendations Therapy as outlined in treatment plan below   Prognosis 05/04/2020 Prognosis for Safe Diet Advancement Fair Barriers to Reach Goals Cognitive deficits Barriers/Prognosis Comment -- CHL IP DIET RECOMMENDATION 05/04/2020 SLP Diet Recommendations Nectar thick liquid Liquid Administration via No straw Medication Administration -- Compensations Slow rate;Small sips/bites;Follow solids with liquid Postural Changes Seated upright at 90 degrees;Remain semi-upright after after feeds/meals (Comment)   CHL IP OTHER RECOMMENDATIONS 05/04/2020 Recommended Consults -- Oral Care Recommendations Oral care BID Other Recommendations --   CHL IP FOLLOW UP RECOMMENDATIONS 05/04/2020 Follow up Recommendations (No Data)   CHL IP FREQUENCY AND DURATION 05/04/2020 Speech Therapy Frequency (ACUTE ONLY) min 2x/week Treatment Duration 2 weeks      CHL IP ORAL PHASE 05/04/2020 Oral Phase -- Oral - Pudding Teaspoon -- Oral - Pudding Cup -- Oral - Honey Teaspoon -- Oral - Honey Cup -- Oral - Nectar Teaspoon -- Oral - Nectar Cup WFL Oral - Nectar Straw Premature spillage;Decreased bolus cohesion Oral - Thin Teaspoon -- Oral - Thin Cup Premature spillage Oral - Thin Straw Premature spillage Oral - Puree WFL Oral -  Mech Soft WFL Oral - Regular -- Oral - Multi-Consistency -- Oral - Pill WFL Oral Phase - Comment --  CHL IP PHARYNGEAL PHASE 05/04/2020 Pharyngeal Phase Impaired Pharyngeal- Pudding  Teaspoon -- Pharyngeal -- Pharyngeal- Pudding Cup -- Pharyngeal -- Pharyngeal- Honey Teaspoon -- Pharyngeal -- Pharyngeal- Honey Cup -- Pharyngeal -- Pharyngeal- Nectar Teaspoon -- Pharyngeal -- Pharyngeal- Nectar Cup -- Pharyngeal -- Pharyngeal- Nectar Straw -- Pharyngeal -- Pharyngeal- Thin Teaspoon -- Pharyngeal -- Pharyngeal- Thin Cup -- Pharyngeal -- Pharyngeal- Thin Straw -- Pharyngeal -- Pharyngeal- Puree -- Pharyngeal -- Pharyngeal- Mechanical Soft -- Pharyngeal -- Pharyngeal- Regular -- Pharyngeal -- Pharyngeal- Multi-consistency -- Pharyngeal -- Pharyngeal- Pill -- Pharyngeal -- Pharyngeal Comment --  CHL IP CERVICAL ESOPHAGEAL PHASE 05/04/2020 Cervical Esophageal Phase Impaired Pudding Teaspoon -- Pudding Cup -- Honey Teaspoon -- Honey Cup -- Nectar Teaspoon -- Nectar Cup Reduced cricopharyngeal relaxation Nectar Straw Reduced cricopharyngeal relaxation Thin Teaspoon -- Thin Cup Reduced cricopharyngeal relaxation Thin Straw Reduced cricopharyngeal relaxation Puree Reduced cricopharyngeal relaxation Mechanical Soft Reduced cricopharyngeal relaxation Regular -- Multi-consistency -- Pill Reduced cricopharyngeal relaxation Cervical Esophageal Comment trace esophageal retention of contrast Kerrie Pleasure, MA, CCC-SLP Acute Rehabilitation Services Office: [phone] 05/04/2020, 11:59 AM              DG Swallowing Func-Speech Pathology  Result Date: 04/25/2020 Objective Swallowing Evaluation: Type of Study: MBS-Modified Barium Swallow Study  Patient Details Name: ANDRAS GRUNEWALD MRN: [id number] Date of Birth: 10-May-1946 Today's Date: 04/25/2020 Time: SLP Start Time (ACUTE ONLY): 1043 -SLP Stop Time (ACUTE ONLY): 1057 SLP Time Calculation (min) (ACUTE ONLY): 14 min Past Medical History: Past Medical History: Diagnosis Date . Asthma   . 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 Newell Coral, 1992-->surg at [facility].  Summer 2021->Cervicalgia,arm pain, hand atrophy RUE wkness, hyperreflex (Dr. 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 Hay fever  . Hypertension  . Osteoarthritis of both hands  Past Surgical History: Past Surgical History: Procedure Laterality Date . ANTERIOR CERVICAL DECOMP/DISCECTOMY FUSION N/A 04/05/2020  Procedure: ANTERIOR CERVICAL DECOMPRESSION/DISCECTOMY FUSION, INTERBODY PROSTHESIS, PLATE/SCREWS CERVICAL THREE-CERVICAL FOUR, CERVICAL FOUR- CERVICAL FIVE;  Surgeon: Tressie Stalker, MD;  Location: [facility] OR;  Service: Neurosurgery;  Laterality: N/A; . ANTERIOR CERVICAL DECOMP/DISCECTOMY FUSION N/A 04/22/2020  Procedure: Reexploration of anterior cervical wound for epidural hematoma;  Surgeon: Donalee Citrin, MD;  Location: [facility] OR;  Service: Neurosurgery;  Laterality: N/A; . BACK SURGERY   . INCISION AND DRAINAGE Left 2012  L hand infection . ROTATOR CUFF REPAIR Right   x 3 . SPINE SURGERY   HPI: Pt is a 74 y.o. 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 Pt s/p evacuation of hemoatoma, removal of hardware and re-implantation of titanium cage packed with autograft bone on 04/22/20.  Pt in hard collar post op. CT head 10/29 negative for acute changes.  Subjective: alert, cooperative Assessment / Plan / Recommendation CHL IP CLINICAL IMPRESSIONS 04/25/2020 Clinical Impression Pt presents with a mild oropharyngeal swallow but with no aspiration or penetration observed. He has reduced posterior propulsion orally with mildly slow mastication and  lingual rocking with solids. Lingual residue c/b a mild coating on the tongue is also noted with solids. His base of tongue retraction is mildly reduced, leading to mild vallecular residue with solids. His UES is opening also appears to be mildly reduced, which could be impacted by location  of cervical hardware. Recommend continuing current diet with SLP f/u for advancement to more solid textures, which can be done clinically.  SLP Visit Diagnosis Dysphagia, oropharyngeal phase (R13.12) Attention and concentration deficit following -- Frontal lobe and executive function deficit following -- Impact on safety and function Mild aspiration risk   CHL IP TREATMENT RECOMMENDATION 04/25/2020 Treatment Recommendations Therapy as outlined in treatment plan below   Prognosis 04/25/2020 Prognosis for Safe Diet Advancement Good Barriers to Reach Goals Cognitive deficits Barriers/Prognosis Comment -- CHL IP DIET RECOMMENDATION 04/25/2020 SLP Diet Recommendations Dysphagia 1 (Puree) solids;Thin liquid Liquid Administration via Cup;Straw Medication Administration Whole meds with puree Compensations Slow rate;Small sips/bites Postural Changes Seated upright at 90 degrees   CHL IP OTHER RECOMMENDATIONS 04/25/2020 Recommended Consults -- Oral Care Recommendations Oral care BID Other Recommendations --   CHL IP FOLLOW UP RECOMMENDATIONS 04/25/2020 Follow up Recommendations Skilled Nursing facility   [facility] IP FREQUENCY AND DURATION 04/25/2020 Speech Therapy Frequency (ACUTE ONLY) min 2x/week Treatment Duration 2 weeks      CHL IP ORAL PHASE 04/25/2020 Oral Phase Impaired Oral - Pudding Teaspoon -- Oral - Pudding Cup -- Oral - Honey Teaspoon -- Oral - Honey Cup -- Oral - Nectar Teaspoon -- Oral - Nectar Cup -- Oral - Nectar Straw -- Oral - Thin Teaspoon -- Oral - Thin Cup Reduced posterior propulsion Oral - Thin Straw Reduced posterior propulsion Oral - Puree Reduced posterior propulsion;Delayed oral transit;Lingual/palatal residue Oral - Mech  Soft Reduced posterior propulsion;Delayed oral transit;Lingual/palatal residue;Impaired mastication Oral - Regular -- Oral - Multi-Consistency -- Oral - Pill Reduced posterior propulsion Oral Phase - Comment --  CHL IP PHARYNGEAL PHASE 04/25/2020 Pharyngeal Phase Impaired Pharyngeal- Pudding Teaspoon -- Pharyngeal -- Pharyngeal- Pudding Cup -- Pharyngeal -- Pharyngeal- Honey Teaspoon -- Pharyngeal -- Pharyngeal- Honey Cup -- Pharyngeal -- Pharyngeal- Nectar Teaspoon -- Pharyngeal -- Pharyngeal- Nectar Cup -- Pharyngeal -- Pharyngeal- Nectar Straw -- Pharyngeal -- Pharyngeal- Thin Teaspoon -- Pharyngeal -- Pharyngeal- Thin Cup Reduced tongue base retraction;Pharyngeal residue - valleculae Pharyngeal -- Pharyngeal- Thin Straw Reduced tongue base retraction;Pharyngeal residue - valleculae Pharyngeal -- Pharyngeal- Puree Reduced tongue base retraction;Pharyngeal residue - valleculae Pharyngeal -- Pharyngeal- Mechanical Soft Reduced tongue base retraction;Pharyngeal residue - valleculae Pharyngeal -- Pharyngeal- Regular -- Pharyngeal -- Pharyngeal- Multi-consistency -- Pharyngeal -- Pharyngeal- Pill Reduced tongue base retraction;Pharyngeal residue - valleculae Pharyngeal -- Pharyngeal Comment --  CHL IP CERVICAL ESOPHAGEAL PHASE 04/25/2020 Cervical Esophageal Phase Impaired Pudding Teaspoon -- Pudding Cup -- Honey Teaspoon -- Honey Cup -- Nectar Teaspoon -- Nectar Cup -- Nectar Straw -- Thin Teaspoon -- Thin Cup Reduced cricopharyngeal relaxation Thin Straw Reduced cricopharyngeal relaxation Puree Reduced cricopharyngeal relaxation Mechanical Soft Reduced cricopharyngeal relaxation Regular -- Multi-consistency -- Pill Reduced cricopharyngeal relaxation Cervical Esophageal Comment -- Mahala Menghini., M.A. CCC-SLP Acute Rehabilitation Services Pager [phone] Office [phone] 04/25/2020, 4:13 PM                 Subjective: Patient is more reactive today, his po intake has improved, no nausea or vomiting. No chest  pain. Tolerating well PT   Discharge Exam: Vitals:   05/10/20 0011 05/10/20 0401  BP: 105/65 110/65  Pulse: (!) 104 100  Resp: 18 19  Temp: 98.9 F (37.2 C) 99 F (37.2 C)  SpO2: 96% 96%   Vitals:   05/09/20 1653 05/09/20 2031 05/10/20 0011 05/10/20 0401  BP: (!) 115/59 105/62 105/65 110/65  Pulse: (!) 108 (!) 111 (!) 104 100  Resp: 15 17 18 19   Temp:  98.1 F (36.7 C) 98.2 F (36.8 C) 98.9 F (37.2 C) 99 F (37.2 C)  TempSrc:  Axillary Axillary Axillary  SpO2: 96% 95% 96% 96%  Weight:      Height:        General: Not in pain or dyspnea,  Neurology: Awake and alert, responds to simple questions and follows simple commands.  E ENT: no pallor, no icterus, oral mucosa moist Cardiovascular: No JVD. S1-S2 present, rhythmic, no gallops, rubs, or murmurs. No lower extremity edema. Pulmonary: positive breath sounds bilaterally, adequate air movement, no wheezing, rhonchi or rales. Gastrointestinal. Abdomen soft and non tender Skin. Know to have pressure ulcers.  Musculoskeletal: no joint deformities   The results of significant diagnostics from this hospitalization (including imaging, microbiology, ancillary and laboratory) are listed below for reference.     Microbiology: Recent Results (from the past 240 hour(s))  Urine culture     Status: Abnormal   Collection Time: 05/03/20 12:43 PM   Specimen: In/Out Cath Urine  Result Value Ref Range Status   Specimen Description IN/OUT CATH URINE  Final   Special Requests   Final    NONE Performed at [facility], [address] [place], [place] [address]    Culture (A)  Final    >=100,000 COLONIES/mL KLEBSIELLA PNEUMONIAE >=100,000 COLONIES/mL ESCHERICHIA COLI    Report Status 05/06/2020 FINAL  Final   Organism ID, Bacteria KLEBSIELLA PNEUMONIAE (A)  Final   Organism ID, Bacteria ESCHERICHIA COLI (A)  Final      Susceptibility   Escherichia coli - MIC*    AMPICILLIN <=2 SENSITIVE Sensitive     CEFAZOLIN <=4  SENSITIVE Sensitive     CEFEPIME <=0.12 SENSITIVE Sensitive     CEFTRIAXONE <=0.25 SENSITIVE Sensitive     CIPROFLOXACIN <=0.25 SENSITIVE Sensitive     GENTAMICIN <=1 SENSITIVE Sensitive     IMIPENEM <=0.25 SENSITIVE Sensitive     NITROFURANTOIN <=16 SENSITIVE Sensitive     TRIMETH/SULFA <=20 SENSITIVE Sensitive     AMPICILLIN/SULBACTAM <=2 SENSITIVE Sensitive     PIP/TAZO <=4 SENSITIVE Sensitive     * >=100,000 COLONIES/mL ESCHERICHIA COLI   Klebsiella pneumoniae - MIC*    AMPICILLIN >=32 RESISTANT Resistant     CEFAZOLIN <=4 SENSITIVE Sensitive     CEFEPIME <=0.12 SENSITIVE Sensitive     CEFTRIAXONE <=0.25 SENSITIVE Sensitive     CIPROFLOXACIN <=0.25 SENSITIVE Sensitive     GENTAMICIN <=1 SENSITIVE Sensitive     IMIPENEM <=0.25 SENSITIVE Sensitive     NITROFURANTOIN 64 INTERMEDIATE Intermediate     TRIMETH/SULFA <=20 SENSITIVE Sensitive     AMPICILLIN/SULBACTAM 4 SENSITIVE Sensitive     PIP/TAZO <=4 SENSITIVE Sensitive     * >=100,000 COLONIES/mL KLEBSIELLA PNEUMONIAE  Blood Culture (routine x 2)     Status: None   Collection Time: 05/03/20  1:00 PM   Specimen: BLOOD  Result Value Ref Range Status   Specimen Description BLOOD SITE NOT SPECIFIED  Final   Special Requests   Final    BOTTLES DRAWN AEROBIC AND ANAEROBIC Blood Culture results may not be optimal due to an inadequate volume of blood received in culture bottles   Culture   Final    NO GROWTH 5 DAYS Performed at [facility], [address] [place], [place] [address]    Report Status 05/08/2020 FINAL  Final  Blood Culture (routine x 2)     Status: None   Collection Time: 05/03/20  1:16 PM   Specimen:  BLOOD  Result Value Ref Range Status   Specimen Description BLOOD SITE NOT SPECIFIED  Final   Special Requests   Final    BOTTLES DRAWN AEROBIC AND ANAEROBIC Blood Culture results may not be optimal due to an inadequate volume of blood received in culture bottles   Culture   Final    NO GROWTH 5  DAYS Performed at [facility], [address], [place], [place] [address]    Report Status 05/08/2020 FINAL  Final  Respiratory Panel by RT PCR (Flu A&B, Covid) - Nasopharyngeal Swab     Status: None   Collection Time: 05/03/20 11:38 PM   Specimen: Nasopharyngeal Swab  Result Value Ref Range Status   SARS Coronavirus 2 by RT PCR NEGATIVE NEGATIVE Final    Comment: (NOTE) SARS-CoV-2 target nucleic acids are NOT DETECTED.  The SARS-CoV-2 RNA is generally detectable in upper respiratoy specimens during the acute phase of infection. The lowest concentration of SARS-CoV-2 viral copies this assay can detect is 131 copies/mL. A negative result does not preclude SARS-Cov-2 infection and should not be used as the sole basis for treatment or other patient management decisions. A negative result may occur with  improper specimen collection/handling, submission of specimen other than nasopharyngeal swab, presence of viral mutation(s) within the areas targeted by this assay, and inadequate number of viral copies (<131 copies/mL). A negative result must be combined with clinical observations, patient history, and epidemiological information. The expected result is Negative.  Fact Sheet for Patients:  https://www.moore.com/  Fact Sheet for Healthcare Providers:  https://www.young.biz/  This test is no t yet approved or cleared by the [place] FDA and  has been authorized for detection and/or diagnosis of SARS-CoV-2 by FDA under an Emergency Use Authorization (EUA). This EUA will remain  in effect (meaning this test can be used) for the duration of the COVID-19 declaration under Section 564(b)(1) of the Act, 21 U.S.C. section 360bbb-3(b)(1), unless the authorization is terminated or revoked sooner.     Influenza A by PCR NEGATIVE NEGATIVE Final   Influenza B by PCR NEGATIVE NEGATIVE Final    Comment: (NOTE) The Xpert Xpress  SARS-CoV-2/FLU/RSV assay is intended as an aid in  the diagnosis of influenza from Nasopharyngeal swab specimens and  should not be used as a sole basis for treatment. Nasal washings and  aspirates are unacceptable for Xpert Xpress SARS-CoV-2/FLU/RSV  testing.  Fact Sheet for Patients: https://www.moore.com/  Fact Sheet for Healthcare Providers: https://www.young.biz/  This test is not yet approved or cleared by the [place] FDA and  has been authorized for detection and/or diagnosis of SARS-CoV-2 by  FDA under an Emergency Use Authorization (EUA). This EUA will remain  in effect (meaning this test can be used) for the duration of the  Covid-19 declaration under Section 564(b)(1) of the Act, 21  U.S.C. section 360bbb-3(b)(1), unless the authorization is  terminated or revoked. Performed at [facility], [address], [place], [place] [address]   Respiratory Panel by RT PCR (Flu A&B, Covid) - Nasopharyngeal Swab     Status: None   Collection Time: 05/09/20  2:48 PM   Specimen: Nasopharyngeal Swab  Result Value Ref Range Status   SARS Coronavirus 2 by RT PCR NEGATIVE NEGATIVE Final    Comment: (NOTE) SARS-CoV-2 target nucleic acids are NOT DETECTED.  The SARS-CoV-2 RNA is generally detectable in upper respiratoy specimens during the acute phase of infection. The lowest concentration of SARS-CoV-2 viral copies this assay  can detect is 131 copies/mL. A negative result does not preclude SARS-Cov-2 infection and should not be used as the sole basis for treatment or other patient management decisions. A negative result may occur with  improper specimen collection/handling, submission of specimen other than nasopharyngeal swab, presence of viral mutation(s) within the areas targeted by this assay, and inadequate number of viral copies (<131 copies/mL). A negative result must be combined with clinical observations, patient history, and  epidemiological information. The expected result is Negative.  Fact Sheet for Patients:  https://www.moore.com/  Fact Sheet for Healthcare Providers:  https://www.young.biz/  This test is no t yet approved or cleared by the [place] FDA and  has been authorized for detection and/or diagnosis of SARS-CoV-2 by FDA under an Emergency Use Authorization (EUA). This EUA will remain  in effect (meaning this test can be used) for the duration of the COVID-19 declaration under Section 564(b)(1) of the Act, 21 U.S.C. section 360bbb-3(b)(1), unless the authorization is terminated or revoked sooner.     Influenza A by PCR NEGATIVE NEGATIVE Final   Influenza B by PCR NEGATIVE NEGATIVE Final    Comment: (NOTE) The Xpert Xpress SARS-CoV-2/FLU/RSV assay is intended as an aid in  the diagnosis of influenza from Nasopharyngeal swab specimens and  should not be used as a sole basis for treatment. Nasal washings and  aspirates are unacceptable for Xpert Xpress SARS-CoV-2/FLU/RSV  testing.  Fact Sheet for Patients: https://www.moore.com/  Fact Sheet for Healthcare Providers: https://www.young.biz/  This test is not yet approved or cleared by the [place] FDA and  has been authorized for detection and/or diagnosis of SARS-CoV-2 by  FDA under an Emergency Use Authorization (EUA). This EUA will remain  in effect (meaning this test can be used) for the duration of the  Covid-19 declaration under Section 564(b)(1) of the Act, 21  U.S.C. section 360bbb-3(b)(1), unless the authorization is  terminated or revoked. Performed at [facility], [address] [place], [place] [address]      Labs: BNP (last 3 results) Recent Labs    05/04/20 0001  BNP 345.1*   Basic Metabolic Panel: Recent Labs  Lab 05/05/20 0158 05/05/20 0158 05/06/20 0444 05/06/20 0444 05/07/20 0101 05/07/20 1157 05/07/20 1959  05/08/20 0318 05/09/20 0336 05/09/20 2014 05/10/20 0229  NA 150*   < > 151*   < > 158*   < > 150* 149* 153* 152* 152*  K 3.7   < > 3.5  --  3.7  --   --  2.9* 3.9  --  3.8  CL 117*   < > 114*  --  117*  --   --  116* 120*  --  121*  CO2 26   < > 27  --  28  --   --  27 26  --  26  GLUCOSE 127*   < > 144*  --  139*  --   --  210* 135*  --  145*  BUN 19   < > 22  --  27*  --   --  22 18  --  18  CREATININE 0.96   < > 1.06  --  1.10  --   --  1.06 1.05  --  0.94  CALCIUM 8.2*   < > 8.2*  --  8.5*  --   --  8.4* 8.4*  --  8.2*  MG 2.1  --  2.1  --  2.2  --   --   --   --   --   --  PHOS  --   --  3.3  --   --   --   --   --   --   --   --    < > = values in this interval not displayed.   Liver Function Tests: Recent Labs  Lab 05/03/20 1243 05/05/20 0158  AST 81* 50*  ALT 157* 89*  ALKPHOS 90 81  BILITOT 1.7* 1.0  PROT 6.2* 5.3*  ALBUMIN 2.2* 1.6*   No results for input(s): LIPASE, AMYLASE in the last 168 hours. No results for input(s): AMMONIA in the last 168 hours. CBC: Recent Labs  Lab 05/06/20 0444 05/07/20 0101 05/08/20 0318 05/09/20 0336 05/10/20 0229  WBC 17.6* 16.0* 13.6* 10.3 8.8  NEUTROABS 15.7* 14.4* 11.7* 8.3* 6.6  HGB 13.3 13.1 12.5* 11.8* 11.2*  HCT 42.3 42.0 40.5 38.9* 36.9*  MCV 90.2 91.5 93.3 93.1 93.2  PLT 152 146* 130* 123* 122*   Cardiac Enzymes: No results for input(s): CKTOTAL, CKMB, CKMBINDEX, TROPONINI in the last 168 hours. BNP: Invalid input(s): POCBNP CBG: Recent Labs  Lab 05/04/20 0755 05/06/20 2000  GLUCAP 115* 128*   D-Dimer No results for input(s): DDIMER in the last 72 hours. Hgb A1c No results for input(s): HGBA1C in the last 72 hours. Lipid Profile No results for input(s): CHOL, HDL, LDLCALC, TRIG, CHOLHDL, LDLDIRECT in the last 72 hours. Thyroid function studies No results for input(s): TSH, T4TOTAL, T3FREE, THYROIDAB in the last 72 hours.  Invalid input(s): FREET3 Anemia work up No results for input(s): VITAMINB12,  FOLATE, FERRITIN, TIBC, IRON, RETICCTPCT in the last 72 hours. Urinalysis    Component Value Date/Time   COLORURINE AMBER (A) 05/03/2020 1243   APPEARANCEUR CLOUDY (A) 05/03/2020 1243   LABSPEC 1.018 05/03/2020 1243   PHURINE 6.0 05/03/2020 1243   GLUCOSEU NEGATIVE 05/03/2020 1243   HGBUR MODERATE (A) 05/03/2020 1243   BILIRUBINUR NEGATIVE 05/03/2020 1243   KETONESUR NEGATIVE 05/03/2020 1243   PROTEINUR 30 (A) 05/03/2020 1243   NITRITE NEGATIVE 05/03/2020 1243   LEUKOCYTESUR LARGE (A) 05/03/2020 1243   Sepsis Labs Invalid input(s): PROCALCITONIN,  WBC,  LACTICIDVEN Microbiology Recent Results (from the past 240 hour(s))  Urine culture     Status: Abnormal   Collection Time: 05/03/20 12:43 PM   Specimen: In/Out Cath Urine  Result Value Ref Range Status   Specimen Description IN/OUT CATH URINE  Final   Special Requests   Final    NONE Performed at [facility], [address] [place], [place] [address]    Culture (A)  Final    >=100,000 COLONIES/mL KLEBSIELLA PNEUMONIAE >=100,000 COLONIES/mL ESCHERICHIA COLI    Report Status 05/06/2020 FINAL  Final   Organism ID, Bacteria KLEBSIELLA PNEUMONIAE (A)  Final   Organism ID, Bacteria ESCHERICHIA COLI (A)  Final      Susceptibility   Escherichia coli - MIC*    AMPICILLIN <=2 SENSITIVE Sensitive     CEFAZOLIN <=4 SENSITIVE Sensitive     CEFEPIME <=0.12 SENSITIVE Sensitive     CEFTRIAXONE <=0.25 SENSITIVE Sensitive     CIPROFLOXACIN <=0.25 SENSITIVE Sensitive     GENTAMICIN <=1 SENSITIVE Sensitive     IMIPENEM <=0.25 SENSITIVE Sensitive     NITROFURANTOIN <=16 SENSITIVE Sensitive     TRIMETH/SULFA <=20 SENSITIVE Sensitive     AMPICILLIN/SULBACTAM <=2 SENSITIVE Sensitive     PIP/TAZO <=4 SENSITIVE Sensitive     * >=100,000 COLONIES/mL ESCHERICHIA COLI   Klebsiella pneumoniae - MIC*    AMPICILLIN >=32 RESISTANT  Resistant     CEFAZOLIN <=4 SENSITIVE Sensitive     CEFEPIME <=0.12 SENSITIVE Sensitive     CEFTRIAXONE  <=0.25 SENSITIVE Sensitive     CIPROFLOXACIN <=0.25 SENSITIVE Sensitive     GENTAMICIN <=1 SENSITIVE Sensitive     IMIPENEM <=0.25 SENSITIVE Sensitive     NITROFURANTOIN 64 INTERMEDIATE Intermediate     TRIMETH/SULFA <=20 SENSITIVE Sensitive     AMPICILLIN/SULBACTAM 4 SENSITIVE Sensitive     PIP/TAZO <=4 SENSITIVE Sensitive     * >=100,000 COLONIES/mL KLEBSIELLA PNEUMONIAE  Blood Culture (routine x 2)     Status: None   Collection Time: 05/03/20  1:00 PM   Specimen: BLOOD  Result Value Ref Range Status   Specimen Description BLOOD SITE NOT SPECIFIED  Final   Special Requests   Final    BOTTLES DRAWN AEROBIC AND ANAEROBIC Blood Culture results may not be optimal due to an inadequate volume of blood received in culture bottles   Culture   Final    NO GROWTH 5 DAYS Performed at [facility], [address] [place], [place] [address]    Report Status 05/08/2020 FINAL  Final  Blood Culture (routine x 2)     Status: None   Collection Time: 05/03/20  1:16 PM   Specimen: BLOOD  Result Value Ref Range Status   Specimen Description BLOOD SITE NOT SPECIFIED  Final   Special Requests   Final    BOTTLES DRAWN AEROBIC AND ANAEROBIC Blood Culture results may not be optimal due to an inadequate volume of blood received in culture bottles   Culture   Final    NO GROWTH 5 DAYS Performed at [facility], [address], [place], [place] [address]    Report Status 05/08/2020 FINAL  Final  Respiratory Panel by RT PCR (Flu A&B, Covid) - Nasopharyngeal Swab     Status: None   Collection Time: 05/03/20 11:38 PM   Specimen: Nasopharyngeal Swab  Result Value Ref Range Status   SARS Coronavirus 2 by RT PCR NEGATIVE NEGATIVE Final    Comment: (NOTE) SARS-CoV-2 target nucleic acids are NOT DETECTED.  The SARS-CoV-2 RNA is generally detectable in upper respiratoy specimens during the acute phase of infection. The lowest concentration of SARS-CoV-2 viral copies this assay can detect is 131  copies/mL. A negative result does not preclude SARS-Cov-2 infection and should not be used as the sole basis for treatment or other patient management decisions. A negative result may occur with  improper specimen collection/handling, submission of specimen other than nasopharyngeal swab, presence of viral mutation(s) within the areas targeted by this assay, and inadequate number of viral copies (<131 copies/mL). A negative result must be combined with clinical observations, patient history, and epidemiological information. The expected result is Negative.  Fact Sheet for Patients:  https://www.moore.com/  Fact Sheet for Healthcare Providers:  https://www.young.biz/  This test is no t yet approved or cleared by the [place] FDA and  has been authorized for detection and/or diagnosis of SARS-CoV-2 by FDA under an Emergency Use Authorization (EUA). This EUA will remain  in effect (meaning this test can be used) for the duration of the COVID-19 declaration under Section 564(b)(1) of the Act, 21 U.S.C. section 360bbb-3(b)(1), unless the authorization is terminated or revoked sooner.     Influenza A by PCR NEGATIVE NEGATIVE Final   Influenza B by PCR NEGATIVE NEGATIVE Final    Comment: (NOTE) The Xpert Xpress SARS-CoV-2/FLU/RSV assay is intended as an aid in  the diagnosis of influenza from Nasopharyngeal swab specimens and  should not be used as a sole basis for treatment. Nasal washings and  aspirates are unacceptable for Xpert Xpress SARS-CoV-2/FLU/RSV  testing.  Fact Sheet for Patients: https://www.moore.com/  Fact Sheet for Healthcare Providers: https://www.young.biz/  This test is not yet approved or cleared by the [place] FDA and  has been authorized for detection and/or diagnosis of SARS-CoV-2 by  FDA under an Emergency Use Authorization (EUA). This EUA will remain  in effect (meaning  this test can be used) for the duration of the  Covid-19 declaration under Section 564(b)(1) of the Act, 21  U.S.C. section 360bbb-3(b)(1), unless the authorization is  terminated or revoked. Performed at [facility], [address] [place], [place] [address]   Respiratory Panel by RT PCR (Flu A&B, Covid) - Nasopharyngeal Swab     Status: None   Collection Time: 05/09/20  2:48 PM   Specimen: Nasopharyngeal Swab  Result Value Ref Range Status   SARS Coronavirus 2 by RT PCR NEGATIVE NEGATIVE Final    Comment: (NOTE) SARS-CoV-2 target nucleic acids are NOT DETECTED.  The SARS-CoV-2 RNA is generally detectable in upper respiratoy specimens during the acute phase of infection. The lowest concentration of SARS-CoV-2 viral copies this assay can detect is 131 copies/mL. A negative result does not preclude SARS-Cov-2 infection and should not be used as the sole basis for treatment or other patient management decisions. A negative result may occur with  improper specimen collection/handling, submission of specimen other than nasopharyngeal swab, presence of viral mutation(s) within the areas targeted by this assay, and inadequate number of viral copies (<131 copies/mL). A negative result must be combined with clinical observations, patient history, and epidemiological information. The expected result is Negative.  Fact Sheet for Patients:  https://www.moore.com/  Fact Sheet for Healthcare Providers:  https://www.young.biz/  This test is no t yet approved or cleared by the [place] FDA and  has been authorized for detection and/or diagnosis of SARS-CoV-2 by FDA under an Emergency Use Authorization (EUA). This EUA will remain  in effect (meaning this test can be used) for the duration of the COVID-19 declaration under Section 564(b)(1) of the Act, 21 U.S.C. section 360bbb-3(b)(1), unless the authorization is terminated or revoked  sooner.     Influenza A by PCR NEGATIVE NEGATIVE Final   Influenza B by PCR NEGATIVE NEGATIVE Final    Comment: (NOTE) The Xpert Xpress SARS-CoV-2/FLU/RSV assay is intended as an aid in  the diagnosis of influenza from Nasopharyngeal swab specimens and  should not be used as a sole basis for treatment. Nasal washings and  aspirates are unacceptable for Xpert Xpress SARS-CoV-2/FLU/RSV  testing.  Fact Sheet for Patients: https://www.moore.com/  Fact Sheet for Healthcare Providers: https://www.young.biz/  This test is not yet approved or cleared by the [place] FDA and  has been authorized for detection and/or diagnosis of SARS-CoV-2 by  FDA under an Emergency Use Authorization (EUA). This EUA will remain  in effect (meaning this test can be used) for the duration of the  Covid-19 declaration under Section 564(b)(1) of the Act, 21  U.S.C. section 360bbb-3(b)(1), unless the authorization is  terminated or revoked. Performed at [facility], [address] [place], [place] [address]      Time coordinating discharge: 45 minutes  SIGNED:   Coralie Keens, MD  [facility] 05/10/2020, 8:37 AM

## 2020-05-10 NOTE — Progress Notes (Signed)
PROGRESS NOTE    CHUE BERKOVICH  TKP:[id number] DOB: 1946-04-07 DOA: 05/03/2020 PCP: [name], MD    Brief Narrative:  Mr. Farver will be hospitalized with a working diagnosis of sepsis due to urinary tract infection(tachycardia, fever and leukocytosis),related to chronic indwelling Foley catheter, present on admission.  74 year old male with cervical injury s/p discectomy, decompression and hematoma evacuation over the last month. Reports with 4 days of not feeling well, along with cough and congestion. He has shown difficulty swallowing. On the day of admission he was found in respiratory distress and transferred to the hospital. On his initial physical examination his blood pressure 115/75, temperature 38.1 C, respiratory rate 25, heart rate 123, oxygen saturation 92% on room air. He had a rigid cervical collar in place, his lungs had rhonchi bilaterally, heart S1-S2, present, tachycardic, soft abdomen, no lower extremity edema. Sodium 147, potassium 4.2, chloride 111, bicarb 27, glucose 158, BUN 20, creatinine 1.15, AST 81, ALT 157, lactic acid 2.7, white count 19.4, hemoglobin 14.8, hematocrit 47.8, platelets 133. SARS COVID-19 negative.Urinalysis more than 50 white cells, 21-50 red cells, specific gravity 1.018, 30 protein. Chest radiograph with right hemidiaphragm elevation, bibasilar atelectasis.  Head and cervical CT scan no acute changes.  CT chest with opacification of the right middle, right lower and left lower lobe bronchi, with complete collapse of the right middle and right lower lobes, likely mucous plugging.   Patient had swallow evaluation and placed on aspiration precautions. Urine culture positive for Klebsiella pneumonia and E. coli.    Assessment & Plan:   Principal Problem:   Sepsis secondary to UTI [facility]) Active Problems:   Cervical spondylosis with myelopathy and radiculopathy   Quadriparesis (HCC)   Benign essential HTN   Cervical  myelopathy (HCC)   Urinary retention   Chronic indwelling Foley catheter   Pressure injury of skin   Protein-calorie malnutrition, severe   Respiration abnormal   Respiratory distress   1.  Sepsis due to urine tract infection, present on admission.  Related to chronic indwelling Foley catheter.  He was admitted to the progressive care unit, he received broad-spectrum antibiotic therapy with cefepime and metronidazole.  His urine culture was positive for Klebsiella and E. coli, both sensitive to cephalosporins.  Patient will complete therapy with cephalexin for 4 more days. Today's wbc is 8,8.   Continue Foley catheter care, exchange every 4 weeks.  2.  Right middle lobe, right lower lobe collapse, mucous plugging, possible aspiration pneumonia.  He was placed on aspiration precautions, speech and swallow evaluation recommended nectar thick liquids. Bedside ultrasonography no significant effusion on the right side.  It was recommended aggressive pulmonary hygiene, and further aspiration precautions.  Oxygenation at discharge 96% on 3 L/min per [place].   3.  Hypernatremia.  Patient developed hypernatremia, peak 153, ADH received D5 water per IV. Recommend close follow-up on electrolytes.  Continue to encourage p.o. intake. Today is Na is 152 with K at 3,8 and bicarbonate at 26, cr 0,94 and bun 18.  Clinically very dehydrated, he is refusing to eat this morning.   Increase IV fluids to D5W at 100 ml per H and follow up on renal function in am.   4.  Quadriplegia.  Patient will return to skilled nursing facility, continue physical therapy/Occupational Therapy.  5.  Stage I-II sacral decubitus ulcer.  Continue local skin care, encourage mobility.  6.  BPH.  Continue Foley catheter care, exchange every 4 weeks.  7.  Hypertension.  Systolic pressure  is well controlled, continue holding antihypertensive agents.  8.  Thrombocytopenia.  Likely reactive.  9.  Severe protein calorie  malnutrition.  Continue nutritional supplements   Patient continue to be at high risk for worsening hypernatremia.   Status is: Inpatient  Remains inpatient appropriate because:IV treatments appropriate due to intensity of illness or inability to take PO   Dispo: The patient is from: SNF              Anticipated d/c is to: SNF              Anticipated d/c date is: 1 day              Patient currently is not medically stable to d/c.   DVT prophylaxis: Enoxaparin   Code Status:   full  Family Communication:  I spoke with patient's sister at the bedside, we talked in detail about patient's condition, plan of care and prognosis and all questions were addressed.        Nutrition Status: Nutrition Problem: Severe Malnutrition Etiology: chronic illness Signs/Symptoms: moderate fat depletion, severe muscle depletion, percent weight loss (17.4% weight loss in less than 4 months) Percent weight loss: 17.4 % Interventions: Magic cup, Hormel Shake     Skin Documentation: Pressure Injury 05/03/20 Sacrum Right;Left;Medial Stage 2 -  Partial thickness loss of dermis presenting as a shallow open injury with a red, pink wound bed without slough. (Active)  05/03/20 2023  Location: Sacrum  Location Orientation: Right;Left;Medial  Staging: Stage 2 -  Partial thickness loss of dermis presenting as a shallow open injury with a red, pink wound bed without slough.  Wound Description (Comments):   Present on Admission: Yes     Consultants:   Pulmonary   Antimicrobials:   Cefepime and metronidazole  Cephalexin     Subjective: Patient this am with eyes closed, declines po intake, no nausea or vomiting, continue to be very weak and deconditioned   Objective: Vitals:   05/09/20 1653 05/09/20 2031 05/10/20 0011 05/10/20 0401  BP: (!) 115/59 105/62 105/65 110/65  Pulse: (!) 108 (!) 111 (!) 104 100  Resp: 15 17 18 19   Temp: 98.1 F (36.7 C) 98.2 F (36.8 C) 98.9 F (37.2 C) 99 F  (37.2 C)  TempSrc:  Axillary Axillary Axillary  SpO2: 96% 95% 96% 96%  Weight:      Height:        Intake/Output Summary (Last 24 hours) at 05/10/2020 0908 Last data filed at 05/10/2020 05/12/2020 Gross per 24 hour  Intake 457.36 ml  Output 700 ml  Net -242.64 ml   Filed Weights   05/03/20 2120  Weight: 84.7 kg    Examination:   General: deconditioned  Neurology: eyes closes, responds to simple questions, declines to eat.  E ENT: no pallor, no icterus, oral mucosa moist Cardiovascular: No JVD. S1-S2 present, rhythmic, no gallops, rubs, or murmurs. Positive pedal edema at lower extremity edema. Pulmonary: positive breath sounds bilaterally, poor air movement, with no wheezing, rhonchi or rales. Decreased breath sounds at bases.  Gastrointestinal. Abdomen soft and non tender Skin. No rashes Musculoskeletal: no joint deformities     Data Reviewed: I have personally reviewed following labs and imaging studies  CBC: Recent Labs  Lab 05/06/20 0444 05/07/20 0101 05/08/20 0318 05/09/20 0336 05/10/20 0229  WBC 17.6* 16.0* 13.6* 10.3 8.8  NEUTROABS 15.7* 14.4* 11.7* 8.3* 6.6  HGB 13.3 13.1 12.5* 11.8* 11.2*  HCT 42.3 42.0 40.5 38.9* 36.9*  MCV 90.2  91.5 93.3 93.1 93.2  PLT 152 146* 130* 123* 122*   Basic Metabolic Panel: Recent Labs  Lab 05/05/20 0158 05/05/20 0158 05/06/20 0444 05/06/20 0444 05/07/20 0101 05/07/20 1157 05/07/20 1959 05/08/20 0318 05/09/20 0336 05/09/20 2014 05/10/20 0229  NA 150*   < > 151*   < > 158*   < > 150* 149* 153* 152* 152*  K 3.7   < > 3.5  --  3.7  --   --  2.9* 3.9  --  3.8  CL 117*   < > 114*  --  117*  --   --  116* 120*  --  121*  CO2 26   < > 27  --  28  --   --  27 26  --  26  GLUCOSE 127*   < > 144*  --  139*  --   --  210* 135*  --  145*  BUN 19   < > 22  --  27*  --   --  22 18  --  18  CREATININE 0.96   < > 1.06  --  1.10  --   --  1.06 1.05  --  0.94  CALCIUM 8.2*   < > 8.2*  --  8.5*  --   --  8.4* 8.4*  --  8.2*  MG 2.1   --  2.1  --  2.2  --   --   --   --   --   --   PHOS  --   --  3.3  --   --   --   --   --   --   --   --    < > = values in this interval not displayed.   GFR: Estimated Creatinine Clearance: 75.7 mL/min (by C-G formula based on SCr of 0.94 mg/dL). Liver Function Tests: Recent Labs  Lab 05/03/20 1243 05/05/20 0158  AST 81* 50*  ALT 157* 89*  ALKPHOS 90 81  BILITOT 1.7* 1.0  PROT 6.2* 5.3*  ALBUMIN 2.2* 1.6*   No results for input(s): LIPASE, AMYLASE in the last 168 hours. No results for input(s): AMMONIA in the last 168 hours. Coagulation Profile: Recent Labs  Lab 05/03/20 1243  INR 1.7*   Cardiac Enzymes: No results for input(s): CKTOTAL, CKMB, CKMBINDEX, TROPONINI in the last 168 hours. BNP (last 3 results) No results for input(s): PROBNP in the last 8760 hours. HbA1C: No results for input(s): HGBA1C in the last 72 hours. CBG: Recent Labs  Lab 05/04/20 0755 05/06/20 2000  GLUCAP 115* 128*   Lipid Profile: No results for input(s): CHOL, HDL, LDLCALC, TRIG, CHOLHDL, LDLDIRECT in the last 72 hours. Thyroid Function Tests: No results for input(s): TSH, T4TOTAL, FREET4, T3FREE, THYROIDAB in the last 72 hours. Anemia Panel: No results for input(s): VITAMINB12, FOLATE, FERRITIN, TIBC, IRON, RETICCTPCT in the last 72 hours.    Radiology Studies: I have reviewed all of the imaging during this hospital visit personally     Scheduled Meds: . cephALEXin  500 mg Oral Q8H  . Chlorhexidine Gluconate Cloth  6 each Topical Daily  . guaiFENesin  20 mL Oral Q6H  . pantoprazole (PROTONIX) IV  40 mg Intravenous Q24H  . polyethylene glycol  17 g Oral Daily  . tamsulosin  0.4 mg Oral Daily   Continuous Infusions: . dextrose 5 % with KCl 20 mEq / L 20 mEq (05/09/20 1446)     LOS: 7 days  Lilibeth Opie Gerome Apley, MD

## 2020-05-11 LAB — BASIC METABOLIC PANEL
Anion gap: 5 (ref 5–15)
BUN: 15 mg/dL (ref 8–23)
CO2: 28 mmol/L (ref 22–32)
Calcium: 8.2 mg/dL — ABNORMAL LOW (ref 8.9–10.3)
Chloride: 115 mmol/L — ABNORMAL HIGH (ref 98–111)
Creatinine, Ser: 0.86 mg/dL (ref 0.61–1.24)
GFR, Estimated: >60 mL/min (ref >60)
Glucose, Bld: 155 mg/dL — ABNORMAL HIGH (ref 70–99)
Potassium: 3.6 mmol/L (ref 3.5–5.1)
Sodium: 148 mmol/L — ABNORMAL HIGH (ref 135–145)

## 2020-05-11 MED ORDER — IBUPROFEN 600 MG PO TABS: 600.0000 mg | ORAL_TABLET | Freq: Four times a day (QID) | ORAL | 0 refills | Status: DC | PRN

## 2020-05-11 MED ORDER — FOOD THICKENER (SIMPLYTHICK)
2.0000 | ORAL | 0 refills | Status: DC | PRN
Start: 2020-05-11 — End: 2020-06-12

## 2020-05-11 MED ORDER — CEPHALEXIN 500 MG PO CAPS: 500.0000 mg | ORAL_CAPSULE | Freq: Three times a day (TID) | ORAL | 0 refills | Status: AC

## 2020-05-11 NOTE — TOC Transition Note (Signed)
Transition of Care [facility]) - CM/SW Discharge Note   Patient Details  Name: Shannon [name] MRN: [id number] Date of Birth: 02/13/1946  [facility] CM/SW Contact:  Lorri Frederick, LCSW Phone Number: 05/11/2020, 10:41 AM   Clinical Narrative:   Pt discharging to [address], Room 603P.  RN call report to [phone].    Final next level of care: Skilled Nursing Facility Barriers to Discharge: Barriers Resolved   Patient Goals and CMS Choice Patient states their goals for this hospitalization and ongoing recovery are:: "get back to normal life" CMS Medicare.gov Compare Post Acute Care list provided to::  (Pt is from [place] [facility] and wants to return there)    Discharge Placement              Patient chooses bed at: [facility] Patient to be transferred to facility by: [facility] Name of family member notified: [name], daughter Patient and family notified of of transfer: 05/11/20  Discharge Plan and Services     Post Acute Care Choice: Resumption of Svcs/PTA Provider, Skilled Nursing Facility [facility])                               Social Determinants of Health (SDOH) Interventions     Readmission Risk Interventions No flowsheet data found.

## 2020-05-11 NOTE — Progress Notes (Addendum)
  Speech Language Pathology Treatment: Dysphagia  Patient Details Name: Shannon [name] MRN: [id number] DOB: [birth year] Today's Date: 05/11/2020 Time: 7062-3762 SLP Time Calculation (min) (ACUTE ONLY): 17 min  Assessment / Plan / Recommendation Clinical Impression  Niece at bedside and provided update for swallowing assessments and intervention thus far. He did not discharge yesterday due to hypernatremia and dehydration per niece. He was accepting of magic cup and nectar thick juice and treatment encompassed implementation and demonstration of strategies with breakfast for pt and education with niece. Audible swallow, immediate throat clear after vocalization and given MBS results, this is not unexpected. As his electrolytes stabilize and rehydrated intake may increase. He will initially decline po's but if repositioned and cup/spoon placed to lips will usually accept. Encouraged niece to attempt even if he says no. Pharyngeal exercises not performed due to pt presently accepting po's and supporting nutritional status. It is recommended he continue full liquids to nectar thick and therapist can trial a ground texture however suspect increase fatigue with mastication may result in lower intake.     HPI HPI: 74 year old white male who presented with right lower lobe pneumonia and known history of  Quadriparesis.  He originally had a C3-4 [place] 4-5 anterior cervical disc asked to me with decompression, C3 3-4 and C4-5 interbody arthrodesis with local autograft bone, anterior cervical plating of C3-C5 on 04/05/2020.  He then went to rehab and was discharged.  On 04/21/2020 the patient presented to the emergency room with progressive bilateral quadriplegia.  Patient went to the operating room and had decompression of a vertebral hematoma.  And been discharged on 11 10/2019 and readmitted on 05/03/2020 for increasing shortness of breath and productive cough.  Chest x-ray demonstrated a new right lower lobe  infiltrate with compressive atelectasis, concerning for aspiration pneumonia.  Pt had MBSS during most recent admission on 04/25/2020 with recommendations for puree and thin liquids, advanced to Dys3 prior to discharge      SLP Plan  Continue with current plan of care       Recommendations  Diet recommendations: Nectar-thick liquid;Other(comment) (full liquids) Liquids provided via: Cup;No straw Medication Administration: Crushed with puree Supervision: Full supervision/cueing for compensatory strategies;Staff to assist with self feeding Compensations: Slow rate;Small sips/bites;Follow solids with liquid Postural Changes and/or Swallow Maneuvers: Seated upright 90 degrees                Oral Care Recommendations: Oral care BID Follow up Recommendations: Skilled Nursing facility SLP Visit Diagnosis: Dysphagia, oropharyngeal phase (R13.12) Plan: Continue with current plan of care       GO                Royce Macadamia 05/11/2020, 9:10 AM

## 2020-05-11 NOTE — Progress Notes (Signed)
Attempted to call report to [facility], was transferred to voicemail. Left general message for call to be returned.

## 2020-05-11 NOTE — Progress Notes (Signed)
Physical Therapy Treatment Patient Details Name: Shannon [name] MRN: [id number] DOB: [birth year] Today's Date: 05/11/2020    History of Present Illness 74 year old white male who presented 11/10 with RLL PNA and known history of  Quadriparesis.  He originally had a C3-4, C 4-5 ACDF on 04/05/2020.  He then went to CIR and was discharged.  Readmitted 04/21/2020 with progressive bilateral quadriplegia due to hematoma with subsequent decompression with D/c to SNF 04/28/20    PT Comments    Pt presents with increased attention today, but still decreased cognition limiting his ability to participate in therapy. PT focused on bed mobility in hopes to get more active participation from the pt. Pt was able to hold himself on L side with no assist, but no active participation with any other bed mobility. Pt would benefit from acute PT in order to increase strength and bed mobility in order to gain more independence when cognition is intact.     Follow Up Recommendations  SNF     Equipment Recommendations  Hospital bed;None recommended by PT    Recommendations for Other Services       Precautions / Restrictions Precautions Precautions: Fall;Cervical Required Braces or Orthoses: Cervical Brace Cervical Brace: Hard collar;At all times    Mobility  Bed Mobility Overal bed mobility: Needs Assistance Bed Mobility: Rolling Rolling: Total assist         General bed mobility comments: total assist with pad to roll bil with cues and assist to reach arm across body and bend knee. In left sidelying pt able to hold without assist on side and assist with initiating rolling back into supine from side. Pt would not open eyes or attempt to view target with cues  Transfers                 General transfer comment: unable  Ambulation/Gait                 Stairs             Wheelchair Mobility    Modified Rankin (Stroke Patients Only)       Balance       Sitting  balance - Comments: not attempted       Standing balance comment: unable to attempt this day                            Cognition Arousal/Alertness: Awake/alert Behavior During Therapy: Flat affect Overall Cognitive Status: No family/caregiver present to determine baseline cognitive functioning Area of Impairment: Following commands;Problem solving;Awareness;Attention;Memory;Safety/judgement;Orientation                 Orientation Level: Disoriented to;Time;Situation Current Attention Level: Focused Memory: Decreased short-term memory Following Commands: Follows one step commands inconsistently;Follows one step commands with increased time Safety/Judgement: Decreased awareness of safety;Decreased awareness of deficits Awareness: Intellectual Problem Solving: Slow processing;Requires verbal cues;Requires tactile cues;Decreased initiation;Difficulty sequencing General Comments: pt opening eyes on command grossly 50% of session today, unable to read board but did read digital clock. Pt with impaired awareness of function and mobility stating "I am moving" when no extremity movement, unable to discern right from left at times. year as "1921" and month ranged from october to feb      Exercises General Exercises - Lower Extremity Heel Slides: Both;10 reps;Supine;PROM Hip ABduction/ADduction: PROM;Both;10 reps;Supine Heel Raises: PROM;Both;10 reps;Supine (pt had slight muscle activation on the L with heel slides)    General Comments  Pertinent Vitals/Pain Pain Assessment: Faces Faces Pain Scale: Hurts a little bit Pain Location: grimace and "ow" with movement at times particularly full knee flexion Pain Descriptors / Indicators: Grimacing Pain Intervention(s): Limited activity within patient's tolerance;Monitored during session;Repositioned    Home Living                      Prior Function            PT Goals (current goals can now be found in  the care plan section) Progress towards PT goals: Progressing toward goals    Frequency    Min 2X/week      PT Plan Current plan remains appropriate    Co-evaluation              AM-PAC PT "6 Clicks" Mobility   Outcome Measure  Help needed turning from your back to your side while in a flat bed without using bedrails?: Total Help needed moving from lying on your back to sitting on the side of a flat bed without using bedrails?: Total Help needed moving to and from a bed to a chair (including a wheelchair)?: Total Help needed standing up from a chair using your arms (e.g., wheelchair or bedside chair)?: Total Help needed to walk in hospital room?: Total Help needed climbing 3-5 steps with a railing? : Total 6 Click Score: 6    End of Session Equipment Utilized During Treatment: Cervical collar Activity Tolerance: Patient tolerated treatment well Patient left: in bed;with call bell/phone within reach;with bed alarm set Nurse Communication: Mobility status PT Visit Diagnosis: Muscle weakness (generalized) (M62.81);Difficulty in walking, not elsewhere classified (R26.2);Other symptoms and signs involving the nervous system (R29.898);Other (comment)     Time: 3976-7341 PT Time Calculation (min) (ACUTE ONLY): 28 min  Charges:  $Therapeutic Exercise: 8-22 mins $Therapeutic Activity: 8-22 mins                     Jeri Cos, SPT [id number]   [place] 05/11/2020, 2:08 PM

## 2020-05-12 ENCOUNTER — Other Ambulatory Visit: Payer: Self-pay | Admitting: Physical Medicine and Rehabilitation

## 2020-05-21 ENCOUNTER — Encounter (HOSPITAL_COMMUNITY): Payer: Self-pay | Admitting: Emergency Medicine

## 2020-05-21 ENCOUNTER — Other Ambulatory Visit: Payer: Self-pay

## 2020-05-21 ENCOUNTER — Inpatient Hospital Stay (HOSPITAL_COMMUNITY)
Admission: EM | Admit: 2020-05-21 | Discharge: 2020-06-12 | DRG: 698 | Disposition: A | Discharge status: Skilled Nursing Facility | Payer: Medicare Other | Attending: Internal Medicine | Admitting: Internal Medicine

## 2020-05-21 ENCOUNTER — Emergency Department (HOSPITAL_COMMUNITY): Payer: Medicare Other

## 2020-05-21 DIAGNOSIS — S3730XA Unspecified injury of urethra, initial encounter: Secondary | ICD-10-CM | POA: Diagnosis present

## 2020-05-21 DIAGNOSIS — L89150 Pressure ulcer of sacral region, unstageable: Secondary | ICD-10-CM | POA: Diagnosis present

## 2020-05-21 DIAGNOSIS — R627 Adult failure to thrive: Secondary | ICD-10-CM | POA: Diagnosis present

## 2020-05-21 DIAGNOSIS — D509 Iron deficiency anemia, unspecified: Secondary | ICD-10-CM | POA: Diagnosis present

## 2020-05-21 DIAGNOSIS — Z23 Encounter for immunization: Secondary | ICD-10-CM

## 2020-05-21 DIAGNOSIS — Z515 Encounter for palliative care: Secondary | ICD-10-CM

## 2020-05-21 DIAGNOSIS — R0902 Hypoxemia: Secondary | ICD-10-CM

## 2020-05-21 DIAGNOSIS — E43 Unspecified severe protein-calorie malnutrition: Secondary | ICD-10-CM | POA: Diagnosis present

## 2020-05-21 DIAGNOSIS — N390 Urinary tract infection, site not specified: Secondary | ICD-10-CM | POA: Diagnosis present

## 2020-05-21 DIAGNOSIS — N319 Neuromuscular dysfunction of bladder, unspecified: Secondary | ICD-10-CM | POA: Diagnosis present

## 2020-05-21 DIAGNOSIS — L8990 Pressure ulcer of unspecified site, unspecified stage: Secondary | ICD-10-CM | POA: Diagnosis present

## 2020-05-21 DIAGNOSIS — R64 Cachexia: Secondary | ICD-10-CM | POA: Diagnosis present

## 2020-05-21 DIAGNOSIS — J9811 Atelectasis: Secondary | ICD-10-CM | POA: Diagnosis not present

## 2020-05-21 DIAGNOSIS — Z811 Family history of alcohol abuse and dependence: Secondary | ICD-10-CM

## 2020-05-21 DIAGNOSIS — Z87728 Personal history of other specified (corrected) congenital malformations of nervous system and sense organs: Secondary | ICD-10-CM

## 2020-05-21 DIAGNOSIS — Z833 Family history of diabetes mellitus: Secondary | ICD-10-CM | POA: Diagnosis not present

## 2020-05-21 DIAGNOSIS — G8250 Quadriplegia, unspecified: Secondary | ICD-10-CM | POA: Diagnosis present

## 2020-05-21 DIAGNOSIS — Z880 Allergy status to penicillin: Secondary | ICD-10-CM

## 2020-05-21 DIAGNOSIS — E538 Deficiency of other specified B group vitamins: Secondary | ICD-10-CM | POA: Diagnosis present

## 2020-05-21 DIAGNOSIS — U071 COVID-19: Secondary | ICD-10-CM | POA: Diagnosis not present

## 2020-05-21 DIAGNOSIS — Z981 Arthrodesis status: Secondary | ICD-10-CM | POA: Diagnosis not present

## 2020-05-21 DIAGNOSIS — N368 Other specified disorders of urethra: Secondary | ICD-10-CM | POA: Diagnosis present

## 2020-05-21 DIAGNOSIS — D638 Anemia in other chronic diseases classified elsewhere: Secondary | ICD-10-CM | POA: Diagnosis present

## 2020-05-21 DIAGNOSIS — Z7401 Bed confinement status: Secondary | ICD-10-CM

## 2020-05-21 DIAGNOSIS — X58XXXA Exposure to other specified factors, initial encounter: Secondary | ICD-10-CM | POA: Diagnosis present

## 2020-05-21 DIAGNOSIS — E669 Obesity, unspecified: Secondary | ICD-10-CM | POA: Diagnosis present

## 2020-05-21 DIAGNOSIS — E860 Dehydration: Secondary | ICD-10-CM | POA: Diagnosis present

## 2020-05-21 DIAGNOSIS — G959 Disease of spinal cord, unspecified: Secondary | ICD-10-CM | POA: Diagnosis present

## 2020-05-21 DIAGNOSIS — E876 Hypokalemia: Secondary | ICD-10-CM | POA: Diagnosis not present

## 2020-05-21 DIAGNOSIS — I10 Essential (primary) hypertension: Secondary | ICD-10-CM | POA: Diagnosis not present

## 2020-05-21 DIAGNOSIS — Y846 Urinary catheterization as the cause of abnormal reaction of the patient, or of later complication, without mention of misadventure at the time of the procedure: Secondary | ICD-10-CM | POA: Diagnosis present

## 2020-05-21 DIAGNOSIS — G9341 Metabolic encephalopathy: Secondary | ICD-10-CM | POA: Diagnosis present

## 2020-05-21 DIAGNOSIS — D62 Acute posthemorrhagic anemia: Secondary | ICD-10-CM | POA: Diagnosis present

## 2020-05-21 DIAGNOSIS — Z79899 Other long term (current) drug therapy: Secondary | ICD-10-CM

## 2020-05-21 DIAGNOSIS — R6521 Severe sepsis with septic shock: Secondary | ICD-10-CM | POA: Diagnosis present

## 2020-05-21 DIAGNOSIS — Z7189 Other specified counseling: Secondary | ICD-10-CM | POA: Diagnosis not present

## 2020-05-21 DIAGNOSIS — R509 Fever, unspecified: Secondary | ICD-10-CM

## 2020-05-21 DIAGNOSIS — N029 Recurrent and persistent hematuria with unspecified morphologic changes: Secondary | ICD-10-CM | POA: Diagnosis present

## 2020-05-21 DIAGNOSIS — Z7901 Long term (current) use of anticoagulants: Secondary | ICD-10-CM

## 2020-05-21 DIAGNOSIS — N400 Enlarged prostate without lower urinary tract symptoms: Secondary | ICD-10-CM | POA: Diagnosis present

## 2020-05-21 DIAGNOSIS — N179 Acute kidney failure, unspecified: Secondary | ICD-10-CM | POA: Diagnosis present

## 2020-05-21 DIAGNOSIS — R1310 Dysphagia, unspecified: Secondary | ICD-10-CM | POA: Diagnosis present

## 2020-05-21 DIAGNOSIS — Z8249 Family history of ischemic heart disease and other diseases of the circulatory system: Secondary | ICD-10-CM | POA: Diagnosis not present

## 2020-05-21 DIAGNOSIS — Z87891 Personal history of nicotine dependence: Secondary | ICD-10-CM

## 2020-05-21 DIAGNOSIS — N3289 Other specified disorders of bladder: Secondary | ICD-10-CM | POA: Diagnosis present

## 2020-05-21 DIAGNOSIS — Z6836 Body mass index (BMI) 36.0-36.9, adult: Secondary | ICD-10-CM

## 2020-05-21 DIAGNOSIS — A419 Sepsis, unspecified organism: Secondary | ICD-10-CM | POA: Diagnosis present

## 2020-05-21 DIAGNOSIS — Z809 Family history of malignant neoplasm, unspecified: Secondary | ICD-10-CM

## 2020-05-21 DIAGNOSIS — L89893 Pressure ulcer of other site, stage 3: Secondary | ICD-10-CM | POA: Diagnosis not present

## 2020-05-21 DIAGNOSIS — R5381 Other malaise: Secondary | ICD-10-CM | POA: Diagnosis present

## 2020-05-21 DIAGNOSIS — J690 Pneumonitis due to inhalation of food and vomit: Secondary | ICD-10-CM | POA: Diagnosis not present

## 2020-05-21 DIAGNOSIS — T83511A Infection and inflammatory reaction due to indwelling urethral catheter, initial encounter: Secondary | ICD-10-CM | POA: Diagnosis present

## 2020-05-21 DIAGNOSIS — L89892 Pressure ulcer of other site, stage 2: Secondary | ICD-10-CM | POA: Diagnosis present

## 2020-05-21 DIAGNOSIS — Z978 Presence of other specified devices: Secondary | ICD-10-CM

## 2020-05-21 LAB — I-STAT VENOUS BLOOD GAS, ED
Acid-Base Excess: 5 mmol/L — ABNORMAL HIGH (ref 0.0–2.0)
Bicarbonate: 31.4 mmol/L — ABNORMAL HIGH (ref 20.0–28.0)
Calcium, Ion: 1.12 mmol/L — ABNORMAL LOW (ref 1.15–1.40)
HCT: 30 % — ABNORMAL LOW (ref 39.0–52.0)
Hemoglobin: 10.2 g/dL — ABNORMAL LOW (ref 13.0–17.0)
O2 Saturation: 79 %
Potassium: 4 mmol/L (ref 3.5–5.1)
Sodium: 142 mmol/L (ref 135–145)
TCO2: 33 mmol/L — ABNORMAL HIGH (ref 22–32)
pCO2, Ven: 52.9 mmHg (ref 44.0–60.0)
pH, Ven: 7.382 (ref 7.250–7.430)
pO2, Ven: 45 mmHg (ref 32.0–45.0)

## 2020-05-21 LAB — I-STAT CHEM 8, ED
BUN: 30 mg/dL — ABNORMAL HIGH (ref 8–23)
Calcium, Ion: 1.09 mmol/L — ABNORMAL LOW (ref 1.15–1.40)
Chloride: 100 mmol/L (ref 98–111)
Creatinine, Ser: 2 mg/dL — ABNORMAL HIGH (ref 0.61–1.24)
Glucose, Bld: 171 mg/dL — ABNORMAL HIGH (ref 70–99)
HCT: 29 % — ABNORMAL LOW (ref 39.0–52.0)
Hemoglobin: 9.9 g/dL — ABNORMAL LOW (ref 13.0–17.0)
Potassium: 4 mmol/L (ref 3.5–5.1)
Sodium: 143 mmol/L (ref 135–145)
TCO2: 29 mmol/L (ref 22–32)

## 2020-05-21 LAB — RESP PANEL BY RT-PCR (FLU A&B, COVID) ARPGX2
Influenza A by PCR: NEGATIVE
Influenza B by PCR: NEGATIVE
SARS Coronavirus 2 by RT PCR: NEGATIVE

## 2020-05-21 LAB — COMPREHENSIVE METABOLIC PANEL
ALT: 32 U/L (ref 0–44)
AST: 42 U/L — ABNORMAL HIGH (ref 15–41)
Albumin: 1.8 g/dL — ABNORMAL LOW (ref 3.5–5.0)
Alkaline Phosphatase: 83 U/L (ref 38–126)
Anion gap: 10 (ref 5–15)
BUN: 25 mg/dL — ABNORMAL HIGH (ref 8–23)
CO2: 28 mmol/L (ref 22–32)
Calcium: 8 mg/dL — ABNORMAL LOW (ref 8.9–10.3)
Chloride: 102 mmol/L (ref 98–111)
Creatinine, Ser: 2.06 mg/dL — ABNORMAL HIGH (ref 0.61–1.24)
GFR, Estimated: 33 mL/min — ABNORMAL LOW (ref >60)
Glucose, Bld: 174 mg/dL — ABNORMAL HIGH (ref 70–99)
Potassium: 4.1 mmol/L (ref 3.5–5.1)
Sodium: 140 mmol/L (ref 135–145)
Total Bilirubin: 0.4 mg/dL (ref 0.3–1.2)
Total Protein: 5.9 g/dL — ABNORMAL LOW (ref 6.5–8.1)

## 2020-05-21 LAB — URINALYSIS, MICROSCOPIC (REFLEX)
RBC / HPF: >50 RBC/hpf (ref 0–5)
Squamous Epithelial / HPF: NONE SEEN (ref 0–5)
WBC, UA: >50 WBC/hpf (ref 0–5)

## 2020-05-21 LAB — URINALYSIS, ROUTINE W REFLEX MICROSCOPIC
Bilirubin Urine: NEGATIVE
Glucose, UA: NEGATIVE mg/dL
Ketones, ur: 15 mg/dL — AB
Nitrite: POSITIVE — AB
Protein, ur: >300 mg/dL — AB
Specific Gravity, Urine: 1.02 (ref 1.005–1.030)
pH: 7 (ref 5.0–8.0)

## 2020-05-21 LAB — CBC WITH DIFFERENTIAL/PLATELET
Abs Immature Granulocytes: 0.24 10*3/uL — ABNORMAL HIGH (ref 0.00–0.07)
Basophils Absolute: 0 10*3/uL (ref 0.0–0.1)
Basophils Relative: 0 %
Eosinophils Absolute: 0 10*3/uL (ref 0.0–0.5)
Eosinophils Relative: 0 %
HCT: 30.9 % — ABNORMAL LOW (ref 39.0–52.0)
Hemoglobin: 9.4 g/dL — ABNORMAL LOW (ref 13.0–17.0)
Immature Granulocytes: 2 %
Lymphocytes Relative: 9 %
Lymphs Abs: 1.1 10*3/uL (ref 0.7–4.0)
MCH: 28.7 pg (ref 26.0–34.0)
MCHC: 30.4 g/dL (ref 30.0–36.0)
MCV: 94.2 fL (ref 80.0–100.0)
Monocytes Absolute: 1.1 10*3/uL — ABNORMAL HIGH (ref 0.1–1.0)
Monocytes Relative: 9 %
Neutro Abs: 9.9 10*3/uL — ABNORMAL HIGH (ref 1.7–7.7)
Neutrophils Relative %: 80 %
Platelets: 187 10*3/uL (ref 150–400)
RBC: 3.28 MIL/uL — ABNORMAL LOW (ref 4.22–5.81)
RDW: 15.7 % — ABNORMAL HIGH (ref 11.5–15.5)
WBC: 12.4 10*3/uL — ABNORMAL HIGH (ref 4.0–10.5)
nRBC: 0 % (ref 0.0–0.2)

## 2020-05-21 LAB — AMMONIA: Ammonia: 21 umol/L (ref 9–35)

## 2020-05-21 LAB — PROTIME-INR
INR: 1.5 — ABNORMAL HIGH (ref 0.8–1.2)
Prothrombin Time: 17.7 seconds — ABNORMAL HIGH (ref 11.4–15.2)

## 2020-05-21 LAB — LACTIC ACID, PLASMA
Lactic Acid, Venous: 3.4 mmol/L (ref 0.5–1.9)
Lactic Acid, Venous: 2.7 mmol/L (ref 0.5–1.9)
Lactic Acid, Venous: 2.2 mmol/L (ref 0.5–1.9)

## 2020-05-21 LAB — T4, FREE: Free T4: 1.1 ng/dL (ref 0.61–1.12)

## 2020-05-21 LAB — TSH: TSH: 1.839 u[IU]/mL (ref 0.350–4.500)

## 2020-05-21 LAB — APTT: aPTT: 31 seconds (ref 24–36)

## 2020-05-21 IMAGING — DX DG CHEST 1V PORT
1 series · 1 of 1 positions shown · non-contrast
Comparison: [DATE]

CLINICAL DATA: Found unresponsive.

EXAM:
PORTABLE CHEST 1 VIEW

[chest]
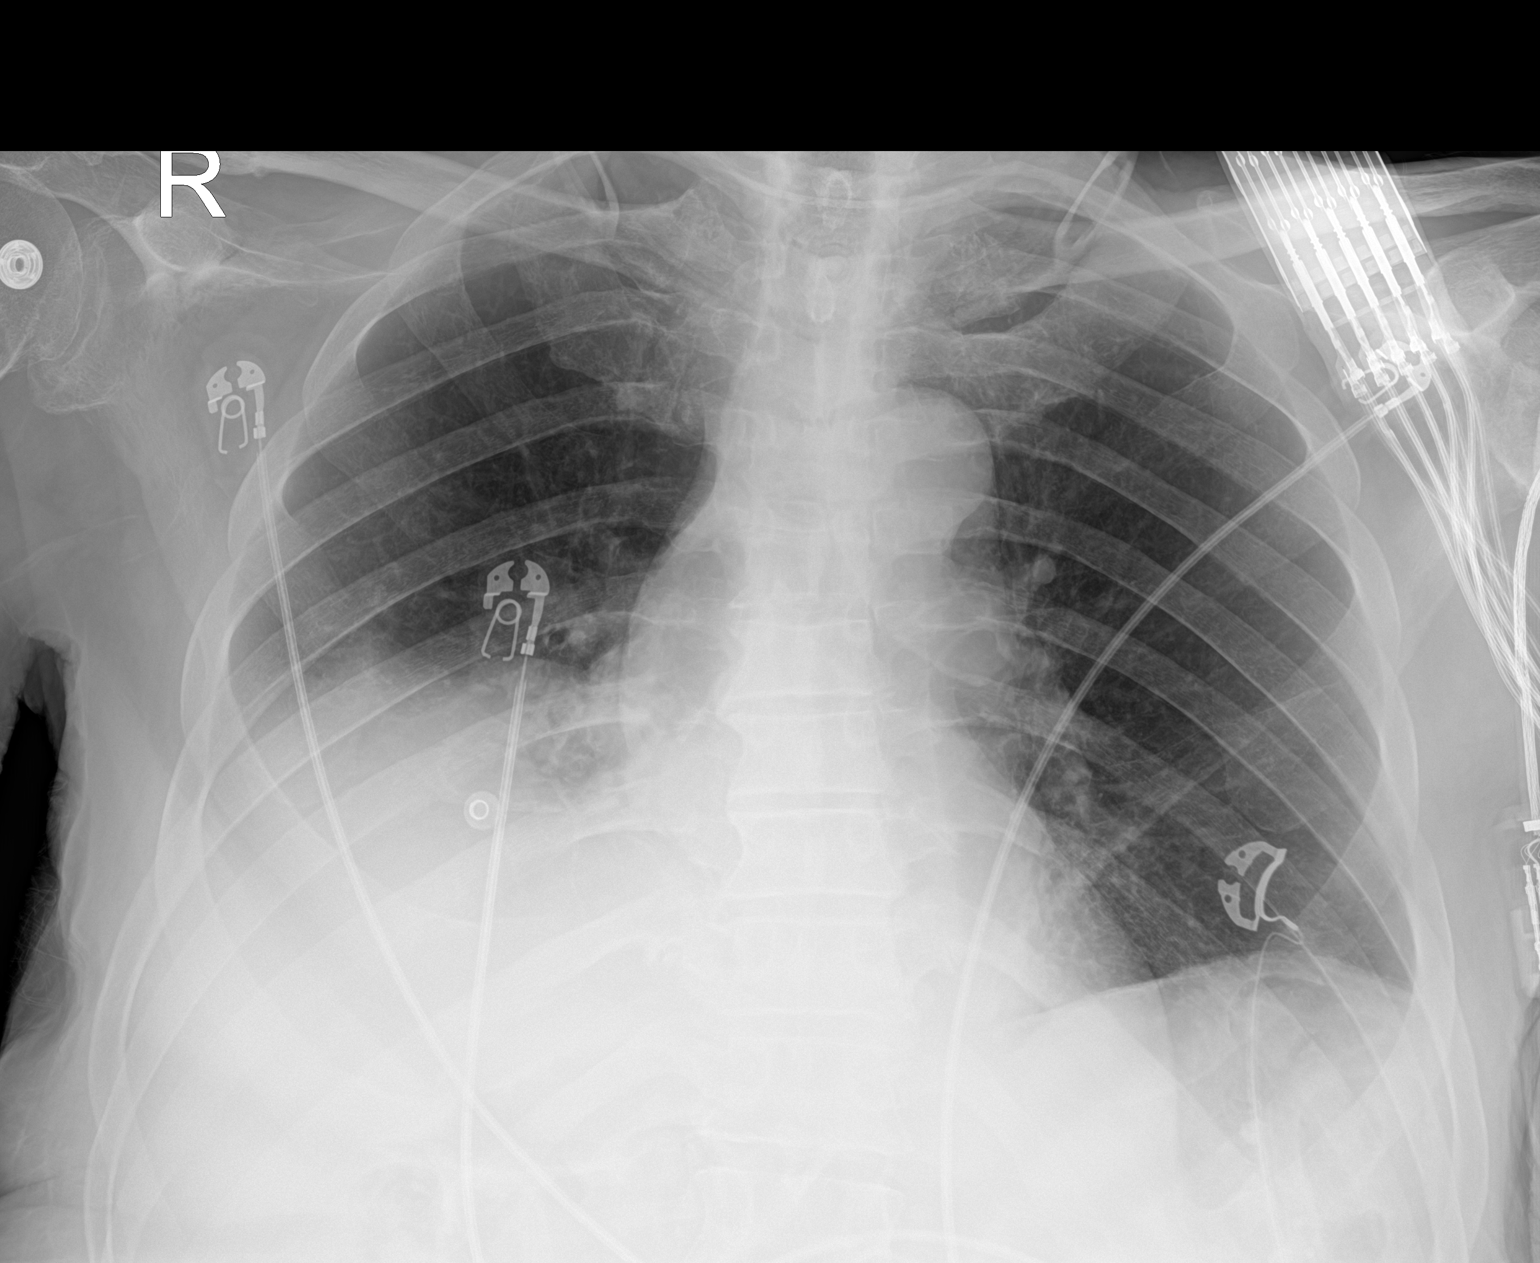

[1 of 1 positions shown; findings below may reference images not displayed]

FINDINGS: There is stable elevation of the right hemidiaphragm. Mild bilateral
infrahilar atelectasis is seen. There is no evidence of a pleural
effusion or pneumothorax. The heart size and mediastinal contours
are within normal limits. The visualized skeletal structures are
unremarkable.
IMPRESSION: Mild bilateral infrahilar atelectasis.

## 2020-05-21 IMAGING — CT CT HEAD W/O CM
4 series · 17 of 47 positions shown, 19 images · non-contrast
Comparison: [DATE]

CLINICAL DATA: Mental status change.

EXAM:
CT HEAD WITHOUT CONTRAST
TECHNIQUE: Contiguous axial images were obtained from the base of the skull
through the vertex without intravenous contrast.

[Series 3: head wo · axial · 0.59mm/px · z∈[-138,-8]mm · 7 of 36 slices shown, 9 images]
[im 5/36  brain]
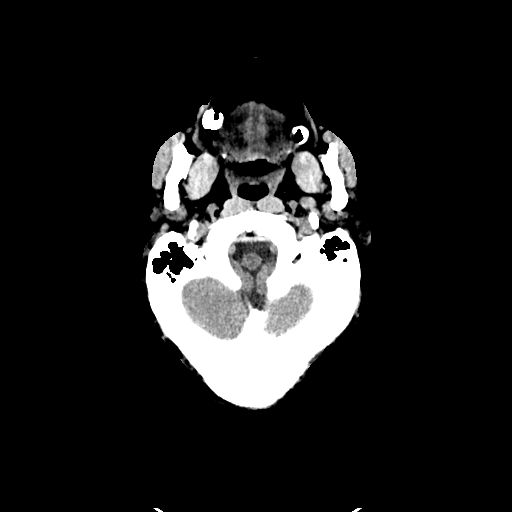
[im 5/36  bone]
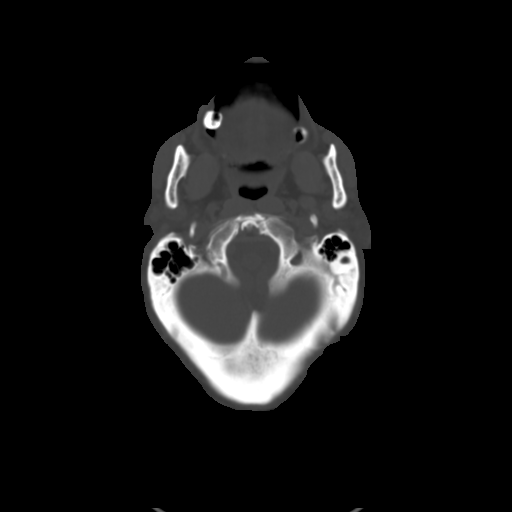
[im 9/36  brain]
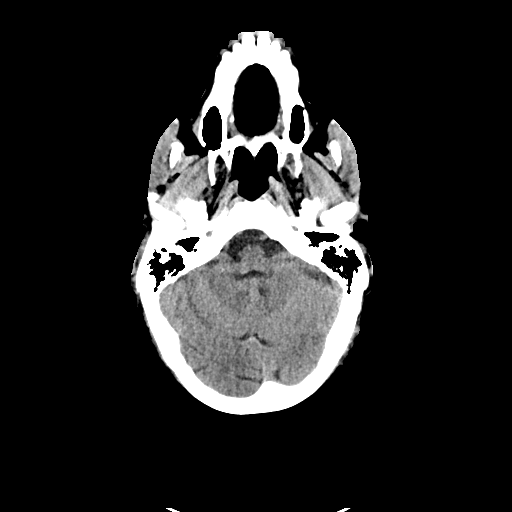
[im 14/36  brain]
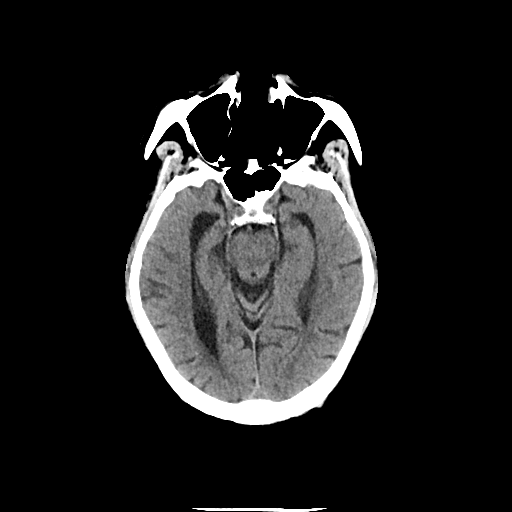
[im 18/36  brain]
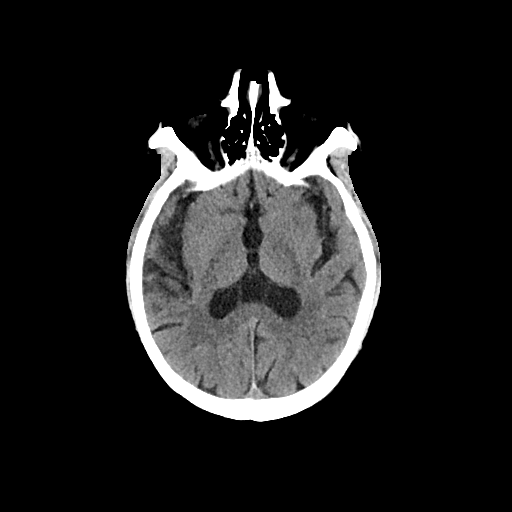
[im 22/36  brain]
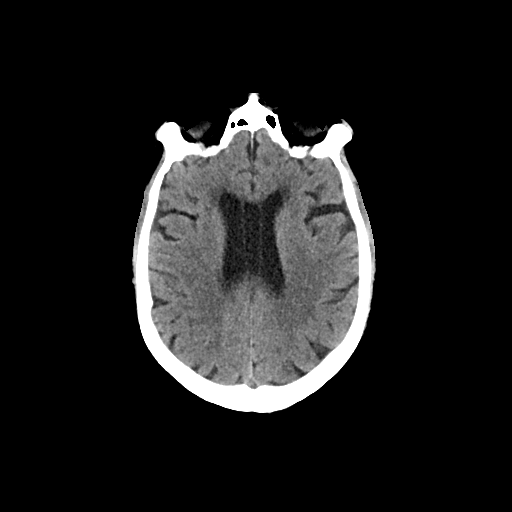
[im 22/36  bone]
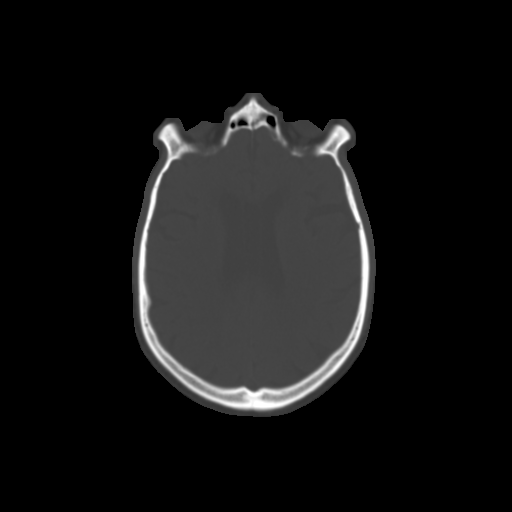
[im 27/36  brain]
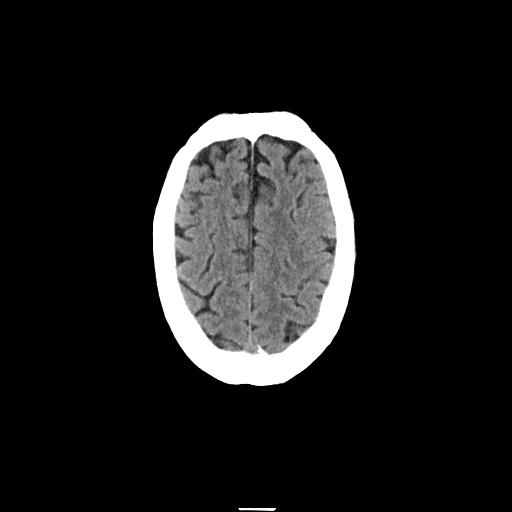
[im 31/36  brain]
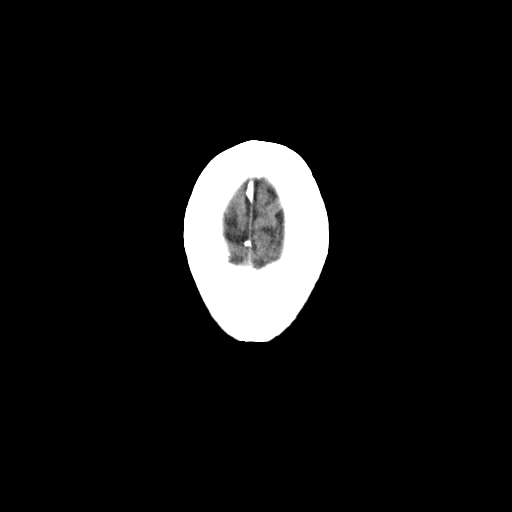

[Series 4: head bone · axial · 0.59mm/px · z∈[-142,-80]mm · 4 of 88 slices shown]
[im 9/88  bone]
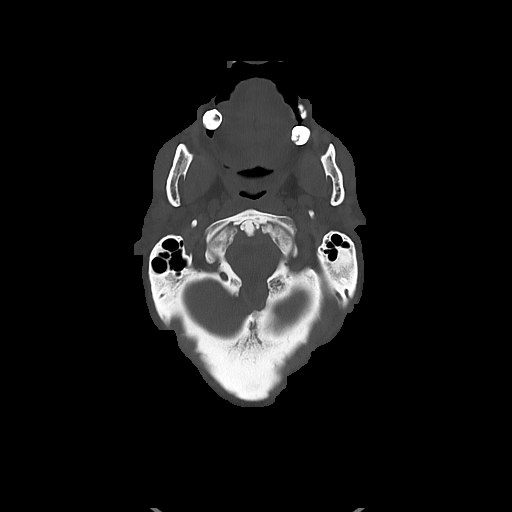
[im 18/88  bone]
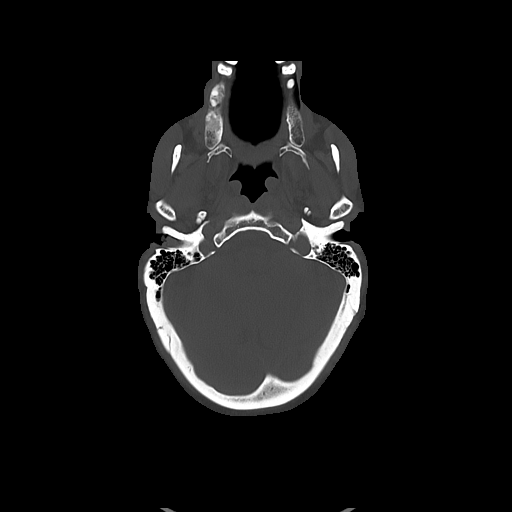
[im 27/88  bone]
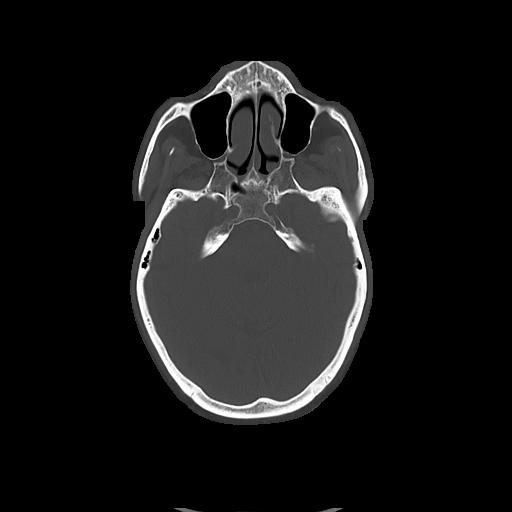
[im 40/88  bone]
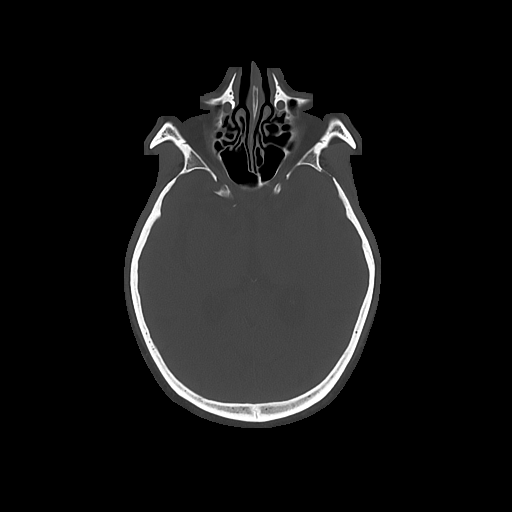

[Series 5: cor soft · coronal · 0.36mm/px · 3 of 77 slices shown]
[im 29/77  brain]
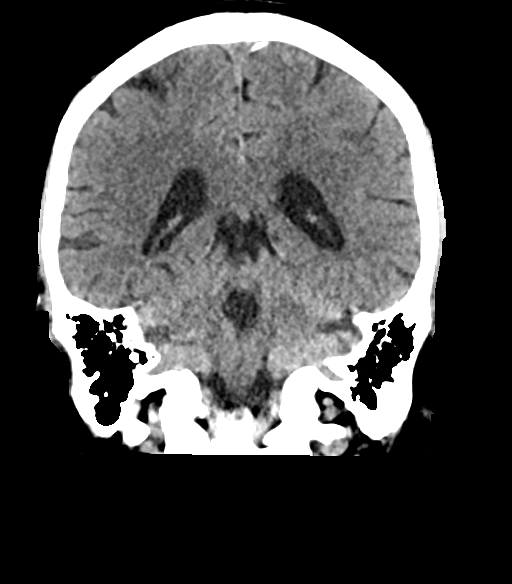
[im 35/77  brain]
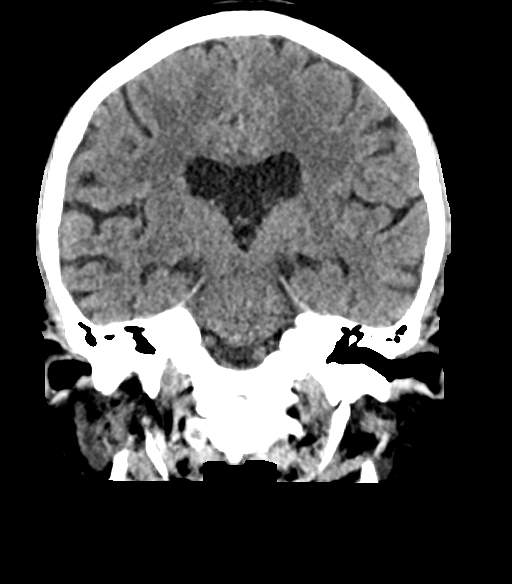
[im 42/77  brain]
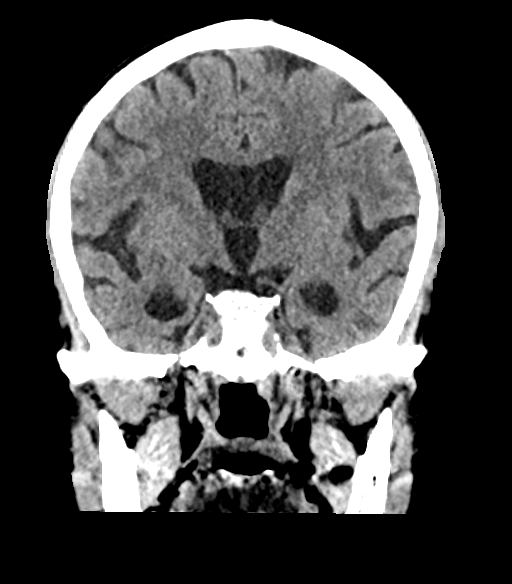

[Series 6: sag soft · sagittal · 0.41mm/px · 3 of 57 slices shown]
[im 19/57  brain]
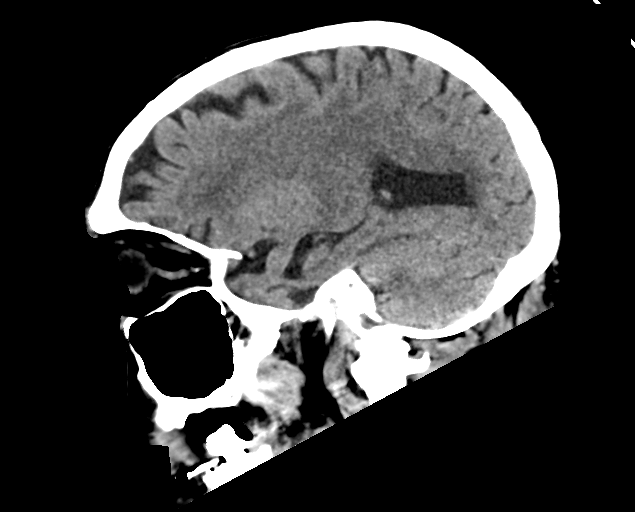
[im 29/57  brain]
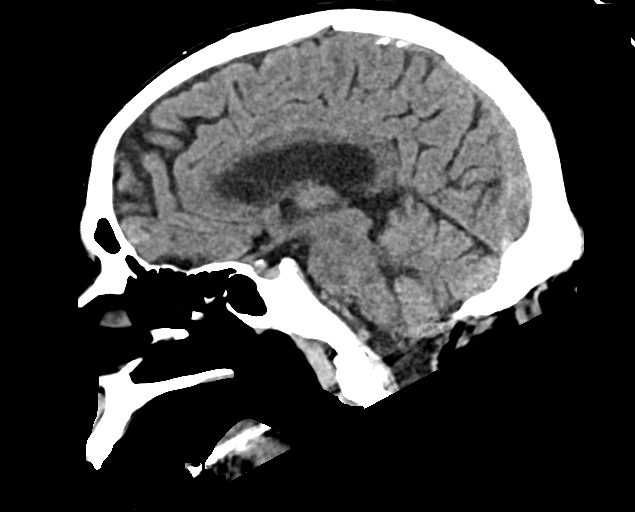
[im 38/57  brain]
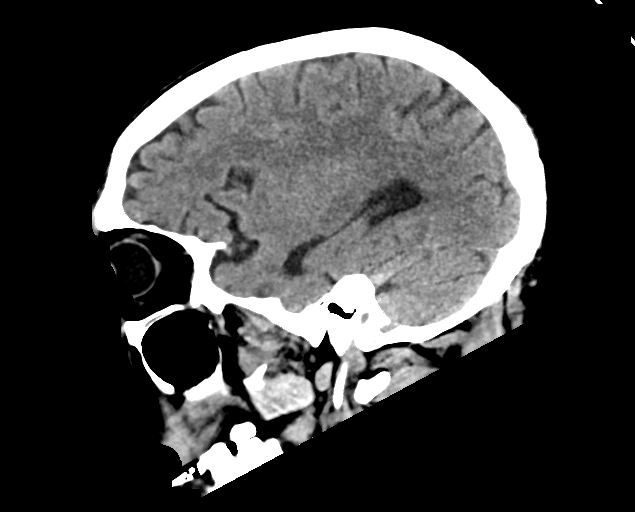

[17 of 47 positions shown; findings below may reference images not displayed]

FINDINGS: Brain: No evidence of acute infarction, hemorrhage, hydrocephalus,
extra-axial collection or mass lesion/mass effect. Mild brain
parenchymal volume loss and deep white matter microangiopathy.

Vascular: No hyperdense vessel or unexpected calcification.

Skull: Normal. Negative for fracture or focal lesion.

Sinuses/Orbits: No acute finding.

Other: None.
IMPRESSION: 1. No acute intracranial abnormality.
2. Mild brain parenchymal atrophy and chronic microvascular disease.

## 2020-05-21 MED ORDER — HEPARIN SODIUM (PORCINE) 5000 UNIT/ML IJ SOLN
5000.0000 [IU] | Freq: Three times a day (TID) | INTRAMUSCULAR | Status: DC
  Administered 2020-05-22 (×2): 5000 [IU] via SUBCUTANEOUS
  Filled 2020-05-21 (×5): qty 1

## 2020-05-21 MED ORDER — LACTATED RINGERS IV BOLUS (SEPSIS)
1000.0000 mL | Freq: Once | INTRAVENOUS | Status: AC
  Administered 2020-05-21: 1000 mL via INTRAVENOUS

## 2020-05-21 MED ORDER — ONDANSETRON HCL 4 MG PO TABS: 4.0000 mg | ORAL_TABLET | Freq: Four times a day (QID) | ORAL | Status: DC | PRN

## 2020-05-21 MED ORDER — VANCOMYCIN HCL 1750 MG/350ML IV SOLN
1750.0000 mg | Freq: Once | INTRAVENOUS | Status: AC
  Administered 2020-05-21: 1750 mg via INTRAVENOUS
  Filled 2020-05-21 (×2): qty 350

## 2020-05-21 MED ORDER — ONDANSETRON HCL 4 MG/2ML IJ SOLN
4.0000 mg | Freq: Four times a day (QID) | INTRAMUSCULAR | Status: DC | PRN
  Administered 2020-06-10: 4 mg via INTRAVENOUS
  Filled 2020-05-21: qty 2

## 2020-05-21 MED ORDER — LACTATED RINGERS IV SOLN: INTRAVENOUS | Status: DC

## 2020-05-21 MED ORDER — KETOROLAC TROMETHAMINE 15 MG/ML IJ SOLN
15.0000 mg | Freq: Four times a day (QID) | INTRAMUSCULAR | Status: DC | PRN
  Administered 2020-05-22 (×2): 15 mg via INTRAVENOUS
  Filled 2020-05-21 (×2): qty 1

## 2020-05-21 MED ORDER — SODIUM CHLORIDE 0.9 % IV SOLN
2.0000 g | Freq: Two times a day (BID) | INTRAVENOUS | Status: DC
  Administered 2020-05-22 – 2020-05-23 (×3): 2 g via INTRAVENOUS
  Filled 2020-05-21 (×4): qty 2

## 2020-05-21 MED ORDER — SODIUM CHLORIDE 0.9 % IV SOLN
2.0000 g | Freq: Once | INTRAVENOUS | Status: AC
  Administered 2020-05-21: 2 g via INTRAVENOUS
  Filled 2020-05-21: qty 2

## 2020-05-21 MED ORDER — LACTATED RINGERS IV BOLUS (SEPSIS)
500.0000 mL | Freq: Once | INTRAVENOUS | Status: AC
  Administered 2020-05-21: 500 mL via INTRAVENOUS

## 2020-05-21 NOTE — ED Notes (Signed)
Patient transported to CT 

## 2020-05-21 NOTE — ED Triage Notes (Signed)
Pt BIB EMS from [facility]. Per EMS pt found by tech at SNF unresponsive. Per facility LSW on Thursday. Pt initial BP in 60s. Pt protecting airway. 98% RA.

## 2020-05-21 NOTE — ED Notes (Signed)
Daughter requests any/all updates be called to her first, then niece & friend. Numbers of all mentioned are updated in chart

## 2020-05-21 NOTE — ED Notes (Signed)
Pt turned to right side.

## 2020-05-21 NOTE — Progress Notes (Signed)
Pharmacy Antibiotic Note  Shannon [name] is a 74 y.o. male admitted on 05/21/2020 with sepsis and UTI.  Pharmacy has been consulted for cefepime dosing. Pt is afebrile but WBC is elevated at 12.4. SCr is elevated at 2.06. Recently treated for a klebsiella and Ecoli UTI during his last admission.   Plan: Cefepime 2gm IV Q12H F/u renal fxn, C&S, clinical status     Temp (24hrs), Avg:97.8 F (36.6 C), Min:97.8 F (36.6 C), Max:97.8 F (36.6 C)  Recent Labs  Lab 05/21/20 1533 05/21/20 1541  WBC 12.4*  --   CREATININE 2.06* 2.00*    CrCl cannot be calculated (Unknown ideal weight.).    Allergies  Allergen Reactions  . Iodine Swelling  . Penicillins Swelling    Facial swelling, itchy throat    Antimicrobials this admission: Cefepime 11/28>>  Dose adjustments this admission: N/A  Microbiology results: Pending  Thank you for allowing pharmacy to be a part of this patient's care.  Sherran Margolis, Drake Leach 05/21/2020 3:45 PM

## 2020-05-21 NOTE — H&P (Signed)
History and Physical   Shannon [name] HWE:[id number] DOB: September 07, 1945 DOA: 05/21/2020  Referring MD/NP/PA: Dr. Bernette Mayers  PCP: [name], MD   Outpatient Specialists: Dr. Tressie Stalker, neurosurgery  Patient coming from: Skilled nursing facility  Chief Complaint: Unresponsive  HPI: Shannon [name] is a 74 y.o. 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  Patient had anterior approach for the repair.  Since then he has been in a nursing home with quadriplegia.  He has literally been bedbound.  Was scheduled to have appointment tomorrow with Dr. Lovell Sheehan.  Has history of hypertension hayfever and osteoarthritis.  Patient's daughter was visiting from [place] on Thanksgiving and noted that he was doing fine until yesterday.  On Friday he started becoming a little confused.  She was worried about his urine.  He has an indwelling catheter due to his quadriplegia.  Today however she went to visit when she saw that he was being transported to the hospital due to altered mental status.  Patient was seen here in the ER with altered mental status initially but very profoundly hypotensive.  He appeared to be in shock initially thought to be hypovolemic but there is evidence of sepsis with UTI.  He finally responded to multiple boluses of fluid and blood pressure now is stable around 100 systolic.  Patient still has sepsis due to UTI.  He still hemodynamically cautious Slee optimistic at this point so we will admit him to the hospitalist service for further treatment and evaluation.  He is now fully awake and communicating daughter at bedside..  ED Course: Temperature 96.2 blood pressure 65/55 pulse pulse 128 respiratory 27 oxygen sat 93% room air.  White count 12.4 hemoglobin 10.2 and platelets 187.  Sodium 142 potassium 4.2 chloride 100 CO2 28 BUN 30 creatinine 2.1 calcium 8.0.  Lactic acid 2.7.   Urinalysis showed cloudy urine with red positive nitrite many bacteria WBC more than 50 proteinuria among other things.  Glucose was notably 171.  Patient being admitted to the hospital with sepsis secondary to UTI.  Review of Systems: As per HPI otherwise 10 point review of systems negative.    Past Medical History:  Diagnosis Date  . Asthma   . 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 Newell Coral, 1992-->surg at [facility]  Summer 2021->Cervicalgia,arm pain, hand atrophy RUE wkness, hyperreflex (Dr. 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 Hay fever   . Hypertension   . Osteoarthritis of both hands     Past Surgical History:  Procedure Laterality Date  . ANTERIOR CERVICAL DECOMP/DISCECTOMY FUSION N/A 04/05/2020   Procedure: ANTERIOR CERVICAL DECOMPRESSION/DISCECTOMY FUSION, INTERBODY PROSTHESIS, PLATE/SCREWS CERVICAL THREE-CERVICAL FOUR, CERVICAL FOUR- CERVICAL FIVE;  Surgeon: Tressie Stalker, MD;  Location: [facility] OR;  Service: Neurosurgery;  Laterality: N/A;  . ANTERIOR CERVICAL DECOMP/DISCECTOMY FUSION N/A 04/22/2020   Procedure: Reexploration of anterior cervical wound for epidural hematoma;  Surgeon: Donalee Citrin, MD;  Location: [facility] OR;  Service: Neurosurgery;  Laterality: N/A;  . BACK SURGERY    . INCISION AND DRAINAGE Left 2012   L hand infection  . ROTATOR CUFF REPAIR Right    x 3  . SPINE SURGERY       reports that he has quit smoking. He has never used smokeless tobacco. He reports previous alcohol use. He reports that  he does not use drugs.  Allergies  Allergen Reactions  . Iodine Swelling  . Penicillins Swelling    Facial swelling, itchy throat    Family History  Problem Relation Age of Onset  . Heart attack Mother   . Heart disease Mother   . High blood pressure Mother   . Alcohol abuse Father   . Cancer Father   . Diabetes  Father   . High blood pressure Father      Prior to Admission medications   Medication Sig Start Date End Date Taking? Authorizing Provider  acetaminophen (TYLENOL) 500 MG tablet Take 1,000 mg by mouth in the morning, at noon, and at bedtime.    [provider]  amLODipine (NORVASC) 10 MG tablet Take 10 mg by mouth daily. 05/18/20   [provider]  ELIQUIS 2.5 MG TABS tablet Take 2.5 mg by mouth 2 (two) times daily. 05/19/20   [provider]  food thickener (SIMPLYTHICK) POWD Take 2 packets by mouth as needed. 05/11/20 06/10/20  Arrien, York Ram, MD  ibuprofen (ADVIL) 600 MG tablet Take 1 tablet (600 mg total) by mouth every 6 (six) hours as needed for fever. 05/11/20   Arrien, York Ram, MD  pantoprazole (PROTONIX) 40 MG tablet Take 1 tablet (40 mg total) by mouth at bedtime. 04/19/20   Love, [name], PA-C  polyethylene glycol (MIRALAX / GLYCOLAX) 17 g packet Take 17 g by mouth daily.    [provider]  tamsulosin (FLOMAX) 0.4 MG CAPS capsule Take 1 capsule (0.4 mg total) by mouth daily. 04/28/20   Tressie Stalker, MD    Physical Exam: Vitals:   05/21/20 1940 05/21/20 1945 05/21/20 1950 05/21/20 1955  BP: 116/67 (!) 98/59 (!) 85/62 (!) 89/61  Pulse: 91 85 (!) 101 (!) 103  Resp: Temp: 97.7 F (36.5 C) 97.7 F (36.5 C) 97.7 F (36.5 C) 97.7 F (36.5 C)  TempSrc:      SpO2: 97% 98% 99% 97%  Weight:      Height:          Constitutional: Awake communicative laying in bed, quadriparetic Vitals:   05/21/20 1940 05/21/20 1945 05/21/20 1950 05/21/20 1955  BP: 116/67 (!) 98/59 (!) 85/62 (!) 89/61  Pulse: 91 85 (!) 101 (!) 103  Resp: Temp: 97.7 F (36.5 C) 97.7 F (36.5 C) 97.7 F (36.5 C) 97.7 F (36.5 C)  TempSrc:      SpO2: 97% 98% 99% 97%  Weight:      Height:       Eyes: PERRL, lids and conjunctivae normal ENMT: Mucous membranes are dry. Posterior pharynx clear of any exudate or  lesions.Normal dentition.  Neck: Neck collar in place, no thyromegaly  Respiratory: clear to auscultation bilaterally, no wheezing, no crackles. Normal respiratory effort. No accessory muscle use.  Cardiovascular: Regular rate and rhythm, no murmurs / rubs / gallops. No extremity edema. 2+ pedal pulses. No carotid bruits.  Abdomen: no tenderness, no masses palpated. No hepatosplenomegaly. Bowel sounds positive.  Musculoskeletal: no clubbing / cyanosis.  Flaccid muscles with some mild skin breakdown  skin: no rashes, l stage II mild sacral decubitus ulcer Neurologic: Quadriplegic and quadriparetic with pollen 0 out of 5 in all 4 limbs Psychiatric: Normal judgment and insight. Alert and oriented x 3.  Depressed mood.     Labs on Admission: I have personally reviewed following labs and imaging studies  CBC: Recent Labs  Lab  05/21/20 1533 05/21/20 1541 05/21/20 1544  WBC 12.4*  --   --   NEUTROABS 9.9*  --   --   HGB 9.4* 9.9* 10.2*  HCT 30.9* 29.0* 30.0*  MCV 94.2  --   --   PLT 187  --   --    Basic Metabolic Panel: Recent Labs  Lab 05/21/20 1533 05/21/20 1541 05/21/20 1544  NA 140 143 142  K 4.1 4.0 4.0  CL 102 100  --   CO2 28  --   --   GLUCOSE 174* 171*  --   BUN 25* 30*  --   CREATININE 2.06* 2.00*  --   CALCIUM 8.0*  --   --    GFR: Estimated Creatinine Clearance: 35.6 mL/min (A) (by C-G formula based on SCr of 2 mg/dL (H)). Liver Function Tests: Recent Labs  Lab 05/21/20 1533  AST 42*  ALT 32  ALKPHOS 83  BILITOT 0.4  PROT 5.9*  ALBUMIN 1.8*   No results for input(s): LIPASE, AMYLASE in the last 168 hours. Recent Labs  Lab 05/21/20 1546  AMMONIA 21   Coagulation Profile: Recent Labs  Lab 05/21/20 1533  INR 1.5*   Cardiac Enzymes: No results for input(s): CKTOTAL, CKMB, CKMBINDEX, TROPONINI in the last 168 hours. BNP (last 3 results) No results for input(s): PROBNP in the last 8760 hours. HbA1C: No results for input(s): HGBA1C in the last 72  hours. CBG: No results for input(s): GLUCAP in the last 168 hours. Lipid Profile: No results for input(s): CHOL, HDL, LDLCALC, TRIG, CHOLHDL, LDLDIRECT in the last 72 hours. Thyroid Function Tests: Recent Labs    05/21/20 1640  TSH 1.839  FREET4 1.10   Anemia Panel: No results for input(s): VITAMINB12, FOLATE, FERRITIN, TIBC, IRON, RETICCTPCT in the last 72 hours. Urine analysis:    Component Value Date/Time   COLORURINE RED (A) 05/21/2020 1722   APPEARANCEUR CLOUDY (A) 05/21/2020 1722   LABSPEC 1.020 05/21/2020 1722   PHURINE 7.0 05/21/2020 1722   GLUCOSEU NEGATIVE 05/21/2020 1722   HGBUR LARGE (A) 05/21/2020 1722   BILIRUBINUR NEGATIVE 05/21/2020 1722   KETONESUR 15 (A) 05/21/2020 1722   PROTEINUR >300 (A) 05/21/2020 1722   NITRITE POSITIVE (A) 05/21/2020 1722   LEUKOCYTESUR MODERATE (A) 05/21/2020 1722   Sepsis Labs: @LABRCNTIP (procalcitonin:4,lacticidven:4) ) Recent Results (from the past 240 hour(s))  Resp Panel by RT-PCR (Flu A&B, Covid) Nasopharyngeal Swab     Status: None   Collection Time: 05/21/20  3:35 PM   Specimen: Nasopharyngeal Swab; Nasopharyngeal(NP) swabs in vial transport medium  Result Value Ref Range Status   SARS Coronavirus 2 by RT PCR NEGATIVE NEGATIVE Final    Comment: (NOTE) SARS-CoV-2 target nucleic acids are NOT DETECTED.  The SARS-CoV-2 RNA is generally detectable in upper respiratory specimens during the acute phase of infection. The lowest concentration of SARS-CoV-2 viral copies this assay can detect is 138 copies/mL. A negative result does not preclude SARS-Cov-2 infection and should not be used as the sole basis for treatment or other patient management decisions. A negative result may occur with  improper specimen collection/handling, submission of specimen other than nasopharyngeal swab, presence of viral mutation(s) within the areas targeted by this assay, and inadequate number of viral copies(<138 copies/mL). A negative result  must be combined with clinical observations, patient history, and epidemiological information. The expected result is Negative.  Fact Sheet for Patients:  05/23/20  Fact Sheet for Healthcare Providers:  BloggerCourse.com  This test is no t yet  approved or cleared by the [place] and  has been authorized for detection and/or diagnosis of SARS-CoV-2 by FDA under an Emergency Use Authorization (EUA). This EUA will remain  in effect (meaning this test can be used) for the duration of the COVID-19 declaration under Section 564(b)(1) of the Act, 21 U.S.C.section 360bbb-3(b)(1), unless the authorization is terminated  or revoked sooner.       Influenza A by PCR NEGATIVE NEGATIVE Final   Influenza B by PCR NEGATIVE NEGATIVE Final    Comment: (NOTE) The Xpert Xpress SARS-CoV-2/FLU/RSV plus assay is intended as an aid in the diagnosis of influenza from Nasopharyngeal swab specimens and should not be used as a sole basis for treatment. Nasal washings and aspirates are unacceptable for Xpert Xpress SARS-CoV-2/FLU/RSV testing.  Fact Sheet for Patients: BloggerCourse.com  Fact Sheet for Healthcare Providers: SeriousBroker.it  This test is not yet approved or cleared by the [place] FDA and has been authorized for detection and/or diagnosis of SARS-CoV-2 by FDA under an Emergency Use Authorization (EUA). This EUA will remain in effect (meaning this test can be used) for the duration of the COVID-19 declaration under Section 564(b)(1) of the Act, 21 U.S.C. section 360bbb-3(b)(1), unless the authorization is terminated or revoked.  Performed at [facility], [address], [place], [place] [address]      Radiological Exams on Admission: CT Head Wo Contrast  Result Date: 05/21/2020 CLINICAL DATA:  Mental status change. EXAM: CT HEAD WITHOUT CONTRAST  TECHNIQUE: Contiguous axial images were obtained from the base of the skull through the vertex without intravenous contrast. COMPARISON:  May 03, 2020 FINDINGS: Brain: No evidence of acute infarction, hemorrhage, hydrocephalus, extra-axial collection or mass lesion/mass effect. Mild brain parenchymal volume loss and deep white matter microangiopathy. Vascular: No hyperdense vessel or unexpected calcification. Skull: Normal. Negative for fracture or focal lesion. Sinuses/Orbits: No acute finding. Other: None. IMPRESSION: 1. No acute intracranial abnormality. 2. 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 Electronically Signed   By: Ted Mcalpine M.D.   On: 05/21/2020 17:53   DG Chest Port 1 View  Result Date: 05/21/2020 CLINICAL DATA:  Found unresponsive. EXAM: PORTABLE CHEST 1 VIEW COMPARISON:  May 08, 2020 FINDINGS: There is stable elevation of the right hemidiaphragm. Mild bilateral infrahilar atelectasis is seen. There is no evidence of a pleural effusion or pneumothorax. The heart size and mediastinal contours are within normal limits. The visualized skeletal structures are unremarkable. IMPRESSION: Mild bilateral infrahilar atelectasis. Electronically Signed   By: Aram Candela M.D.   On: 05/21/2020 16:34      Assessment/Plan Principal Problem:   Sepsis secondary to UTI [facility]) Active Problems:   Benign essential HTN   Quadriplegia and quadriparesis (HCC)   Essential hypertension   Myelopathy (HCC)   Chronic indwelling Foley catheter   Pressure injury of skin     #1 severe sepsis due to UTI: Based on the criteria of sepsis and increased lactic acid appears to have severe sepsis at the outset.  Most likely due to indwelling catheter.  Patient will be admitted and initiated on IV antibiotics.  Has penicillin allergies.  Initiate aztreonam.  Urine culture sensitivities as well as blood cultures obtained.  Will follow patient's resolution of symptoms.   If and when cultures are back we will narrow down his antibiotic coverage.  #2 quadriplegia and quadriparesis: Patient scheduled to have follow-up tomorrow with his general surgeon Dr. Tressie Stalker.  We will attempt to consult them in  the morning to see patient in house.  Recommendations from neurosurgery will be followed.  #3 essential hypertension: Continue home regimen.  #4 sacral decubitus ulcer stage II: Wound care will continue.  #5 chronic indwelling catheter: Needs to be changed monthly.  Patient unfortunately has bladder dysfunction.   DVT prophylaxis: Lovenox Code Status: Full code Family Communication: Daughter at bedside Disposition Plan: Back to skilled facility Consults called: None but please consult Dr. Tressie StalkerJeffrey Jenkins in the morning Admission status: Inpatient  Severity of Illness: The appropriate patient status for this patient is INPATIENT. Inpatient status is judged to be reasonable and necessary in order to provide the required intensity of service to ensure the patient's safety. The patient's presenting symptoms, physical exam findings, and initial radiographic and laboratory data in the context of their chronic comorbidities is felt to place them at high risk for further clinical deterioration. Furthermore, it is not anticipated that the patient will be medically stable for discharge from the hospital within 2 midnights of admission. The following factors support the patient status of inpatient.   " The patient's presenting symptoms include altered mental status. " The worrisome physical exam findings include quadriparesis. " The initial radiographic and laboratory data are worrisome because of evidence of sepsis with UTI. " The chronic co-morbidities include quadriplegia.   * I certify that at the point of admission it is my clinical judgment that the patient will require inpatient hospital care spanning beyond 2 midnights from the point of admission due to high  intensity of service, high risk for further deterioration and high frequency of surveillance required.Lonia Blood*    Clarity Ciszek,LAWAL MD [facility] Pager [phone]  If 7PM-7AM, please contact night-coverage [email or web address] Password [email or web address]  05/21/2020, 8:03 PM

## 2020-05-21 NOTE — ED Provider Notes (Addendum)
[facility] Provider Note   CSN: [id number] Arrival date & time: 05/21/20  1521     History Chief Complaint  Patient presents with  . Unresponsive    Shannon [name] is a 74 y.o. male with chronic indwelling Foley, asthma, Chiari I malformation, and hypertension who presents from SNF with altered mental status.  Per EMS, patient was last seen normal about 3 days ago (Thursday) by family.  History was limited from staff at facility who reported calling EMS due to hematuria and hypotension.  Daughter arrived at bedside and stated that she last saw her father normal 3 days ago and he was little more confused than normal 2 days ago.  She arrived to the facility today to see him being put in an ambulance but was not informed of this.  Per daughter, patient is normally awake, alert, able to communicate well.  On arrival, patient lethargic but opens eyes to voice, follows simple commands.  Airway intact.  The history is provided by the EMS personnel. The history is limited by the condition of the patient.  Altered Mental Status Presenting symptoms: unresponsiveness   Severity:  Severe Most recent episode:  Today Episode history:  Continuous Timing:  Constant Progression:  Unable to specify Chronicity:  New Context: nursing home resident        Past Medical History:  Diagnosis Date  . Asthma   . 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 Newell CoralNudelman, 1992-->[facility]  Summer 2021->Cervicalgia,arm pain, hand atrophy RUE wkness, hyperreflex (Dr. Sharyn CreamerJenkins)-cerv MRI: extensive cord atrophy and spinal and foraminal stenosis->to get surgery 03/2020  . Hay fever   . Hypertension   . Osteoarthritis of both hands     Patient Active Problem List   Diagnosis Date Noted  . Respiration abnormal   . Respiratory distress   . Protein-calorie malnutrition, severe 05/05/2020  .  Pressure injury of skin 05/04/2020  . Pneumonia 05/03/2020  . Sepsis secondary to UTI (HCC) 05/03/2020  . Urinary retention 05/03/2020  . Chronic indwelling Foley catheter 05/03/2020  . 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 Myelopathy (HCC) 04/22/2020  . Quadriplegia and quadriparesis (HCC)   . Acute blood loss anemia   . Slow transit constipation   . Essential hypertension   . Cervical myelopathy (HCC) 04/07/2020  . Quadriparesis (HCC)   . Benign essential HTN   . Post-operative pain   . Neuropathic pain   . Asthma   . 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 ANTERIOR CERVICAL DECOMP/DISCECTOMY FUSION N/A 04/05/2020   Procedure: ANTERIOR CERVICAL DECOMPRESSION/DISCECTOMY FUSION, INTERBODY PROSTHESIS, PLATE/SCREWS CERVICAL THREE-CERVICAL FOUR, CERVICAL FOUR- CERVICAL FIVE;  Surgeon: Tressie StalkerJenkins, Jeffrey, MD;  [facility];  Service: Neurosurgery;  Laterality: N/A;  . ANTERIOR CERVICAL DECOMP/DISCECTOMY FUSION N/A 04/22/2020   Procedure: Reexploration of anterior cervical wound for epidural hematoma;  Surgeon: Donalee Citrinram, Gary, MD;  [facility];  Service: Neurosurgery;  Laterality: N/A;  . BACK SURGERY    . INCISION AND DRAINAGE Left 2012   L hand infection  . ROTATOR CUFF REPAIR Right    x 3  . SPINE SURGERY         Family History  Problem Relation Age of Onset  . Heart attack Mother   . Heart disease Mother   . High blood pressure Mother   .  Alcohol abuse Father   . Cancer Father   . Diabetes Father   . High blood pressure Father     Social History   Tobacco Use  . Smoking status: Former Games developer  . Smokeless tobacco: Never Used  Vaping Use  . Vaping Use: Never used  Substance Use Topics  . Alcohol use: Not Currently  . Drug use: Never    Home Medications Prior to Admission medications   Medication Sig Start Date End Date Taking? Authorizing Provider  acetaminophen  (TYLENOL) 500 MG tablet Take 1,000 mg by mouth in the morning, at noon, and at bedtime.    [provider]  amLODipine (NORVASC) 10 MG tablet Take 10 mg by mouth daily. 05/18/20   [provider]  ELIQUIS 2.5 MG TABS tablet Take 2.5 mg by mouth 2 (two) times daily. 05/19/20   [provider]  food thickener (SIMPLYTHICK) POWD Take 2 packets by mouth as needed. 05/11/20 06/10/20  Arrien, York Ram, MD  ibuprofen (ADVIL) 600 MG tablet Take 1 tablet (600 mg total) by mouth every 6 (six) hours as needed for fever. 05/11/20   Arrien, York Ram, MD  pantoprazole (PROTONIX) 40 MG tablet Take 1 tablet (40 mg total) by mouth at bedtime. 04/19/20   Love, [name], PA-C  polyethylene glycol (MIRALAX / GLYCOLAX) 17 g packet Take 17 g by mouth daily.    [provider]  tamsulosin (FLOMAX) 0.4 MG CAPS capsule Take 1 capsule (0.4 mg total) by mouth daily. 04/28/20   Tressie Stalker, MD    Allergies    Iodine and Penicillins  Review of Systems   Review of Systems  Unable to perform ROS: Mental status change    Physical Exam Updated Vital Signs BP 107/88   Pulse (!) 108   Temp 97.9 F (36.6 C)   Resp 15   Ht 6' (1.829 m)   Wt 120.7 kg   SpO2 95%   BMI 36.10 kg/m   Physical Exam Vitals and nursing note reviewed.  Constitutional:      General: He is in acute distress.     Appearance: He is well-developed. He is ill-appearing. He is not toxic-appearing or diaphoretic.  HENT:     Head: Normocephalic and atraumatic.     Comments: In Aspen collar    Mouth/Throat:     Mouth: Mucous membranes are dry.  Eyes:     Conjunctiva/sclera: Conjunctivae normal.  Cardiovascular:     Rate and Rhythm: Normal rate and regular rhythm.     Pulses: Normal pulses.     Heart sounds: No murmur heard.   Pulmonary:     Effort: Pulmonary effort is normal. No respiratory distress.     Breath sounds: Rhonchi present.  Abdominal:     General: There is no distension.      Palpations: Abdomen is soft.     Tenderness: There is no abdominal tenderness.  Genitourinary:    Comments: Gross hematuria from foley Musculoskeletal:        General: No swelling or tenderness.     Cervical back: Neck supple.  Skin:    General: Skin is warm and dry.     Findings: Wound present.     Comments: Sacral ulcer that appears infected. See picture in EMR.  Neurological:     Mental Status: He is alert.     GCS: GCS eye subscore is 3. GCS verbal subscore is 2. GCS motor subscore is 6.     Comments: Follows  commands, can say his name, opens eyes to name. Lethargic.     ED Results / Procedures / Treatments   Labs (all labs ordered are listed, but only abnormal results are displayed) Labs Reviewed  LACTIC ACID, PLASMA - Abnormal; Notable for the following components:      Result Value   Lactic Acid, Venous 3.4 (*)    All other components within normal limits  LACTIC ACID, PLASMA - Abnormal; Notable for the following components:   Lactic Acid, Venous 2.2 (*)    All other components within normal limits  COMPREHENSIVE METABOLIC PANEL - Abnormal; Notable for the following components:   Glucose, Bld 174 (*)    BUN 25 (*)    Creatinine, Ser 2.06 (*)    Calcium 8.0 (*)    Total Protein 5.9 (*)    Albumin 1.8 (*)    AST 42 (*)    GFR, Estimated 33 (*)    All other components within normal limits  CBC WITH DIFFERENTIAL/PLATELET - Abnormal; Notable for the following components:   WBC 12.4 (*)    RBC 3.28 (*)    Hemoglobin 9.4 (*)    HCT 30.9 (*)    RDW 15.7 (*)    Neutro Abs 9.9 (*)    Monocytes Absolute 1.1 (*)    Abs Immature Granulocytes 0.24 (*)    All other components within normal limits  PROTIME-INR - Abnormal; Notable for the following components:   Prothrombin Time 17.7 (*)    INR 1.5 (*)    All other components within normal limits  URINALYSIS, ROUTINE W REFLEX MICROSCOPIC - Abnormal; Notable for the following components:   Color, Urine RED (*)     APPearance CLOUDY (*)    Hgb urine dipstick LARGE (*)    Ketones, ur 15 (*)    Protein, ur >300 (*)    Nitrite POSITIVE (*)    Leukocytes,Ua MODERATE (*)    All other components within normal limits  LACTIC ACID, PLASMA - Abnormal; Notable for the following components:   Lactic Acid, Venous 2.7 (*)    All other components within normal limits  URINALYSIS, MICROSCOPIC (REFLEX) - Abnormal; Notable for the following components:   Bacteria, UA MANY (*)    All other components within normal limits  I-STAT CHEM 8, ED - Abnormal; Notable for the following components:   BUN 30 (*)    Creatinine, Ser 2.00 (*)    Glucose, Bld 171 (*)    Calcium, Ion 1.09 (*)    Hemoglobin 9.9 (*)    HCT 29.0 (*)    All other components within normal limits  I-STAT VENOUS BLOOD GAS, ED - Abnormal; Notable for the following components:   Bicarbonate 31.4 (*)    TCO2 33 (*)    Acid-Base Excess 5.0 (*)    Calcium, Ion 1.12 (*)    HCT 30.0 (*)    Hemoglobin 10.2 (*)    All other components within normal limits  RESP PANEL BY RT-PCR (FLU A&B, COVID) ARPGX2  CULTURE, BLOOD (ROUTINE X 2)  CULTURE, BLOOD (ROUTINE X 2)  URINE CULTURE  APTT  AMMONIA  TSH  T4, FREE  PROTIME-INR  CORTISOL-AM, BLOOD  PROCALCITONIN  CBC  COMPREHENSIVE METABOLIC PANEL  TYPE AND SCREEN    EKG EKG Interpretation  Date/Time:  Sunday May 21 2020 15:29:43 EST Ventricular Rate:  90 PR Interval:    QRS Duration: 98 QT Interval:  359 QTC Calculation: 440 R Axis:   16 Text Interpretation:  Sinus rhythm Borderline low voltage, extremity leads Minimal ST elevation, anterior leads Since last tracing Rate slower Confirmed by Susy Frizzle [phone]) on 05/21/2020 3:35:36 PM   Radiology CT Head Wo Contrast  Result Date: 05/21/2020 CLINICAL DATA:  Mental status change. EXAM: CT HEAD WITHOUT CONTRAST TECHNIQUE: Contiguous axial images were obtained from the base of the skull through the vertex without intravenous contrast.  COMPARISON:  May 03, 2020 FINDINGS: Brain: No evidence of acute infarction, hemorrhage, hydrocephalus, extra-axial collection or mass lesion/mass effect. Mild brain parenchymal volume loss and deep white matter microangiopathy. Vascular: No hyperdense vessel or unexpected calcification. Skull: Normal. Negative for fracture or focal lesion. Sinuses/Orbits: No acute finding. Other: None. IMPRESSION: 1. No acute intracranial abnormality. 2. 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 Electronically Signed   By: Ted Mcalpine M.D.   On: 05/21/2020 17:53   DG Chest Port 1 View  Result Date: 05/21/2020 CLINICAL DATA:  Found unresponsive. EXAM: PORTABLE CHEST 1 VIEW COMPARISON:  May 08, 2020 FINDINGS: There is stable elevation of the right hemidiaphragm. Mild bilateral infrahilar atelectasis is seen. There is no evidence of a pleural effusion or pneumothorax. The heart size and mediastinal contours are within normal limits. The visualized skeletal structures are unremarkable. IMPRESSION: Mild bilateral infrahilar atelectasis. Electronically Signed   By: Aram Candela M.D.   On: 05/21/2020 16:34    Procedures Procedures (including critical care time)  Medications Ordered in ED Medications  lactated ringers infusion ( Intravenous New Bag/Given 05/21/20 1709)  ceFEPIme (MAXIPIME) 2 g in sodium chloride 0.9 % 100 mL IVPB (has no administration in time range)  heparin injection 5,000 Units (5,000 Units Subcutaneous Given 05/22/20 0001)  lactated ringers infusion ( Intravenous Not Given 05/21/20 2155)  ketorolac (TORADOL) 15 MG/ML injection 15 mg (has no administration in time range)  ondansetron (ZOFRAN) tablet 4 mg (has no administration in time range)    Or  ondansetron (ZOFRAN) injection 4 mg (has no administration in time range)  lactated ringers bolus 1,000 mL (0 mLs Intravenous Stopped 05/21/20 1711)    And  lactated ringers bolus 1,000 mL (0 mLs Intravenous  Stopped 05/21/20 1927)    And  lactated ringers bolus 500 mL (0 mLs Intravenous Stopped 05/21/20 1804)  ceFEPIme (MAXIPIME) 2 g in sodium chloride 0.9 % 100 mL IVPB (0 g Intravenous Stopped 05/21/20 1704)  vancomycin (VANCOREADY) IVPB 1750 mg/350 mL (0 mg Intravenous Stopped 05/21/20 2148)  lactated ringers bolus 1,000 mL (0 mLs Intravenous Stopped 05/21/20 2055)    And  lactated ringers bolus 1,000 mL (0 mLs Intravenous Stopped 05/21/20 2148)    And  lactated ringers bolus 1,000 mL (0 mLs Intravenous Stopped 05/21/20 2325)  aztreonam (AZACTAM) 2 g in sodium chloride 0.9 % 100 mL IVPB (0 g Intravenous Stopped 05/21/20 2230)    ED Course  I have reviewed the triage vital signs and the nursing notes.  Pertinent labs & imaging results that were available during my care of the patient were reviewed by me and considered in my medical decision making (see chart for details).    MDM Rules/Calculators/A&P                         MDM: Michaelanthony Kempton is a 74 y.o. male who presents with altered mental status as per above. I have reviewed the nursing documentation for past medical history, family history, and social history. Pertinent previous records reviewed. He is awake, alert.  Borderline hypotensive.  Temperature 97.2 F. Physical exam is most notable for airway intact, obese simple commands, opens eyes to voice.  Gross hematuria from Foley.  Overall ill-appearing.  Labs: Lactic 3.4 which improved to 2.7.  UA from new Foley catheter with moderate leukocytes, positive nitrates, proteinuria, and large blood.  AKI with creatinine of 2.  Hemoglobin 9.4 (1.8 drop). EKG: NSR, Nonspecific ST changes, low voltage. QTc, PR, and QRS within appropriate limits. No signs of acute ischemia, infarct, or significant electrical abnormalities. No STEMI, ST depressions, or significant T wave inversions. No evidence of a High-Grade Conduction Block, WPW, Brugada Sign, ARVC, DeWinters T Waves, or Wellens Waves. Imaging:  CXR demonstrating mild bilateral atelectasis.  CT head with no acute intracranial normality. Consults: none Tx: 30 cc/kg LR bolus, Vancomycin, cefepime  Differential Dx: I am most concerned for septic shock secondary to urinary source.  Bacteremia and/or pneumonia possible. Given history, physical exam, and work-up, I do not think he has meningitis/encephalitis, subclinical seizures, intracranial hemorrhage, drug intoxication, CO2 narcosis, or trauma.  MDM: Linkon Siverson is a 74 y.o. male presents from nursing facility with altered mental status for the past 2 to 3 days.  Patient with gross hematuria likely related to patient's UTI, AKI, and Eliquis (home med).  Pressures improved with LR/fluids.  Given broad-spectrum antibiotics given concern for sepsis related to his indwelling Foley.  On reassessment, patient maintaining his airway, following simple commands, opening eyes to voice.  Daughter at bedside was updated on work-up thus far and plan for admission.  Hospitalist consulted for admission, handoff given.  Stable for admission.  The plan for this patient was discussed with Dr. Bernette Mayers, who voiced agreement and who oversaw evaluation and treatment of this patient.   Final Clinical Impression(s) / ED Diagnoses Final diagnoses:  None    Rx / DC Orders ED Discharge Orders    None       Gershon Mussel, MD 05/22/20 0350    Gershon Mussel, MD 05/22/20 0057    Pollyann Savoy, MD 05/22/20 1101

## 2020-05-22 ENCOUNTER — Encounter (HOSPITAL_COMMUNITY): Payer: Self-pay | Admitting: Internal Medicine

## 2020-05-22 DIAGNOSIS — N390 Urinary tract infection, site not specified: Secondary | ICD-10-CM | POA: Diagnosis not present

## 2020-05-22 DIAGNOSIS — A419 Sepsis, unspecified organism: Secondary | ICD-10-CM | POA: Diagnosis not present

## 2020-05-22 LAB — COMPREHENSIVE METABOLIC PANEL
ALT: 27 U/L (ref 0–44)
AST: 34 U/L (ref 15–41)
Albumin: 1.8 g/dL — ABNORMAL LOW (ref 3.5–5.0)
Alkaline Phosphatase: 71 U/L (ref 38–126)
Anion gap: 9 (ref 5–15)
BUN: 24 mg/dL — ABNORMAL HIGH (ref 8–23)
CO2: 25 mmol/L (ref 22–32)
Calcium: 7.9 mg/dL — ABNORMAL LOW (ref 8.9–10.3)
Chloride: 103 mmol/L (ref 98–111)
Creatinine, Ser: 1.5 mg/dL — ABNORMAL HIGH (ref 0.61–1.24)
GFR, Estimated: 49 mL/min — ABNORMAL LOW (ref >60)
Glucose, Bld: 124 mg/dL — ABNORMAL HIGH (ref 70–99)
Potassium: 3.7 mmol/L (ref 3.5–5.1)
Sodium: 137 mmol/L (ref 135–145)
Total Bilirubin: 0.6 mg/dL (ref 0.3–1.2)
Total Protein: 5.1 g/dL — ABNORMAL LOW (ref 6.5–8.1)

## 2020-05-22 LAB — RETICULOCYTES
Immature Retic Fract: 30.5 % — ABNORMAL HIGH (ref 2.3–15.9)
RBC.: 2.68 MIL/uL — ABNORMAL LOW (ref 4.22–5.81)
Retic Count, Absolute: 103.4 10*3/uL (ref 19.0–186.0)
Retic Ct Pct: 3.9 % — ABNORMAL HIGH (ref 0.4–3.1)

## 2020-05-22 LAB — PROTIME-INR
INR: 1.5 — ABNORMAL HIGH (ref 0.8–1.2)
Prothrombin Time: 17.9 seconds — ABNORMAL HIGH (ref 11.4–15.2)

## 2020-05-22 LAB — CORTISOL: Cortisol, Plasma: 20.9 ug/dL

## 2020-05-22 LAB — IRON AND TIBC
Iron: 25 ug/dL — ABNORMAL LOW (ref 45–182)
Saturation Ratios: 17 % — ABNORMAL LOW (ref 17.9–39.5)
TIBC: 150 ug/dL — ABNORMAL LOW (ref 250–450)
UIBC: 125 ug/dL

## 2020-05-22 LAB — FERRITIN: Ferritin: 89 ng/mL (ref 24–336)

## 2020-05-22 LAB — PROCALCITONIN: Procalcitonin: 0.12 ng/mL

## 2020-05-22 LAB — HEMOGLOBIN AND HEMATOCRIT, BLOOD
HCT: 25.1 % — ABNORMAL LOW (ref 39.0–52.0)
Hemoglobin: 7.7 g/dL — ABNORMAL LOW (ref 13.0–17.0)

## 2020-05-22 LAB — CBC
HCT: 25.9 % — ABNORMAL LOW (ref 39.0–52.0)
Hemoglobin: 8.1 g/dL — ABNORMAL LOW (ref 13.0–17.0)
MCH: 29.2 pg (ref 26.0–34.0)
MCHC: 31.3 g/dL (ref 30.0–36.0)
MCV: 93.5 fL (ref 80.0–100.0)
Platelets: 179 10*3/uL (ref 150–400)
RBC: 2.77 MIL/uL — ABNORMAL LOW (ref 4.22–5.81)
RDW: 15.8 % — ABNORMAL HIGH (ref 11.5–15.5)
WBC: 14.1 10*3/uL — ABNORMAL HIGH (ref 4.0–10.5)
nRBC: 0 % (ref 0.0–0.2)

## 2020-05-22 LAB — URINE CULTURE: Culture: NO GROWTH

## 2020-05-22 LAB — VITAMIN B12: Vitamin B-12: 160 pg/mL — ABNORMAL LOW (ref 180–914)

## 2020-05-22 LAB — GLUCOSE, CAPILLARY: Glucose-Capillary: 100 mg/dL — ABNORMAL HIGH (ref 70–99)

## 2020-05-22 LAB — CORTISOL-AM, BLOOD: Cortisol - AM: 30 ug/dL — ABNORMAL HIGH (ref 6.7–22.6)

## 2020-05-22 LAB — C-REACTIVE PROTEIN: CRP: 0.7 mg/dL (ref <1.0)

## 2020-05-22 LAB — FOLATE: Folate: 5.9 ng/mL — ABNORMAL LOW (ref >5.9)

## 2020-05-22 MED ORDER — PANTOPRAZOLE SODIUM 40 MG PO TBEC
40.0000 mg | DELAYED_RELEASE_TABLET | Freq: Every day | ORAL | Status: DC
  Administered 2020-05-22 – 2020-06-11 (×21): 40 mg via ORAL
  Filled 2020-05-22 (×21): qty 1

## 2020-05-22 MED ORDER — COLLAGENASE 250 UNIT/GM EX OINT
TOPICAL_OINTMENT | Freq: Every day | CUTANEOUS | Status: DC
  Administered 2020-05-31: 1 via TOPICAL
  Filled 2020-05-22 (×4): qty 30

## 2020-05-22 MED ORDER — LACTATED RINGERS IV SOLN: INTRAVENOUS | Status: DC

## 2020-05-22 MED ORDER — TAMSULOSIN HCL 0.4 MG PO CAPS
0.4000 mg | ORAL_CAPSULE | Freq: Every day | ORAL | Status: DC
  Administered 2020-05-23 – 2020-06-12 (×21): 0.4 mg via ORAL
  Filled 2020-05-22 (×21): qty 1

## 2020-05-22 MED ORDER — RESOURCE THICKENUP CLEAR PO POWD
ORAL | Status: DC | PRN
  Filled 2020-05-22: qty 125

## 2020-05-22 MED ORDER — CHLORHEXIDINE GLUCONATE CLOTH 2 % EX PADS
6.0000 | MEDICATED_PAD | Freq: Every day | CUTANEOUS | Status: DC
  Administered 2020-05-22 – 2020-06-11 (×21): 6 via TOPICAL

## 2020-05-22 NOTE — Consult Note (Signed)
[facility] Surgery Consult Note  Shannon [name] [birth year]  [id number].   Requesting MD: Susa Raring, MD Chief Complaint/Reason for Consult: sacral decubitus ulcer   HPI:  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 He has been in a nursing home with chronic indwelling foley since that time. He has developed decubitus ulcers as a result of his immobility and also had a recent admission 05/03/20 for sepsis due to UTI. He was sent to the ED yesterday from SNF due to hematuria and hypotension. General surgery has been asked to evaluate sacral decubitus ulcers. Per chart review he was taking Eliquis at the SNF for DVT ppx. This has currently been held.  ROS: Review of Systems  Constitutional: Negative for chills and fever.  Eyes: Negative.   Respiratory: Negative.   Cardiovascular: Negative.   Gastrointestinal: Negative for abdominal pain, nausea and vomiting.  Genitourinary: Positive for hematuria.       Reports his sacrum/buttock hurts but not more than usual  Musculoskeletal: Positive for back pain.  Neurological: Positive for focal weakness. Negative for seizures and loss of consciousness.  Psychiatric/Behavioral: Negative.     Family History  Problem Relation Age of Onset  . Heart attack Mother   . Heart disease Mother   . High blood pressure Mother   . Alcohol abuse Father   . Cancer Father   . Diabetes Father   . High blood pressure Father     Past Medical History:  Diagnosis Date  . Asthma   . 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 Newell Coral, 1992-->surg at [facility].  Summer 2021->Cervicalgia,arm pain, hand atrophy RUE wkness, hyperreflex (Dr. 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 Hay fever   . Hypertension   .  Osteoarthritis of both hands     Past Surgical History:  Procedure Laterality Date  . ANTERIOR CERVICAL DECOMP/DISCECTOMY FUSION N/A 04/05/2020   Procedure: ANTERIOR CERVICAL DECOMPRESSION/DISCECTOMY FUSION, INTERBODY PROSTHESIS, PLATE/SCREWS CERVICAL THREE-CERVICAL FOUR, CERVICAL FOUR- CERVICAL FIVE;  Surgeon: Tressie Stalker, MD;  Location: [facility] OR;  Service: Neurosurgery;  Laterality: N/A;  . ANTERIOR CERVICAL DECOMP/DISCECTOMY FUSION N/A 04/22/2020   Procedure: Reexploration of anterior cervical wound for epidural hematoma;  Surgeon: Donalee Citrin, MD;  Location: [facility] OR;  Service: Neurosurgery;  Laterality: N/A;  . BACK SURGERY    . INCISION AND DRAINAGE Left 2012   L hand infection  . ROTATOR CUFF REPAIR Right    x 3  . SPINE SURGERY      Social History:  reports that he has quit smoking. He has never used smokeless tobacco. He reports previous alcohol use. He reports that he does not use drugs.  Allergies:  Allergies  Allergen Reactions  . Iodine Swelling  . Penicillins Swelling    Facial swelling, itchy throat    (Not in a hospital admission)   Blood pressure 103/60, pulse 78, temperature 97.9 F (36.6 C), resp. rate 16, height 6' (1.829 m), weight 120.7 kg, SpO2 94 %. Physical Exam: Constitutional: NAD; chronically ill appearing male  Eyes: Moist conjunctiva; no lid lag; anicteric; PERRL Neck: c-collar  Lungs: Normal respiratory effort; no tactile fremitus CV: RRR; no palpable thrills; no pitting edema GI: Abd soft, nontender no palpable hepatosplenomegaly Sacrum: decubitus ulcer as below with  80% overlying eschar, there is no drainage or odor, no surrounding cellulitis       MSK: extremities symmetrical; no clubbing/cyanosis Psychiatric: Appropriate affect; oriented to person and place, not time Lymphatic: No palpable cervical or axillary lymphadenopathy Neuro: unable to move bilateral upper or lower extremities   Results for orders placed or performed during the  hospital encounter of 05/21/20 (from the past 48 hour(s))  Lactic acid, plasma     Status: Abnormal   Collection Time: 05/21/20  3:33 PM  Result Value Ref Range   Lactic Acid, Venous 3.4 (HH) 0.5 - 1.9 mmol/L    Comment: CRITICAL RESULT CALLED TO, READ BACK BY AND VERIFIED WITH: RYAN HARDY [email or web address]  ON 11.28.21 BY TCALDWELL MT. Performed at [facility] Lab, [address], [place], [place] [address]   Comprehensive metabolic panel     Status: Abnormal   Collection Time: 05/21/20  3:33 PM  Result Value Ref Range   Sodium 140 135 - 145 mmol/L   Potassium 4.1 3.5 - 5.1 mmol/L   Chloride 102 98 - 111 mmol/L   CO2 28 22 - 32 mmol/L   Glucose, Bld 174 (H) 70 - 99 mg/dL    Comment: Glucose reference range applies only to samples taken after fasting for at least 8 hours.   BUN 25 (H) 8 - 23 mg/dL   Creatinine, Ser 1.70 (H) 0.61 - 1.24 mg/dL   Calcium 8.0 (L) 8.9 - 10.3 mg/dL   Total Protein 5.9 (L) 6.5 - 8.1 g/dL   Albumin 1.8 (L) 3.5 - 5.0 g/dL   AST 42 (H) 15 - 41 U/L   ALT 32 0 - 44 U/L   Alkaline Phosphatase 83 38 - 126 U/L   Total Bilirubin 0.4 0.3 - 1.2 mg/dL   GFR, Estimated 33 (L) >60 mL/min    Comment: (NOTE) Calculated using the CKD-EPI Creatinine Equation (2021)    Anion gap 10 5 - 15    Comment: Performed at [facility], [address] [place], [place] [address]  CBC WITH DIFFERENTIAL     Status: Abnormal   Collection Time: 05/21/20  3:33 PM  Result Value Ref Range   WBC 12.4 (H) 4.0 - 10.5 K/uL   RBC 3.28 (L) 4.22 - 5.81 MIL/uL   Hemoglobin 9.4 (L) 13.0 - 17.0 g/dL   HCT 44.9 (L) 39 - 52 %   MCV 94.2 80.0 - 100.0 fL   MCH 28.7 26.0 - 34.0 pg   MCHC 30.4 30.0 - 36.0 g/dL   RDW 67.5 (H) 91.6 - 38.4 %   Platelets 187 150 - 400 K/uL   nRBC 0.0 0.0 - 0.2 %   Neutrophils Relative % 80 %   Neutro Abs 9.9 (H) 1.7 - 7.7 K/uL   Lymphocytes Relative 9 %   Lymphs Abs 1.1 0.7 - 4.0 K/uL   Monocytes Relative 9 %   Monocytes Absolute 1.1 (H) 0.1 - 1.0 K/uL    Eosinophils Relative 0 %   Eosinophils Absolute 0.0 0.0 - 0.5 K/uL   Basophils Relative 0 %   Basophils Absolute 0.0 0.0 - 0.1 K/uL   Immature Granulocytes 2 %   Abs Immature Granulocytes 0.24 (H) 0.00 - 0.07 K/uL    Comment: Performed at [facility] Lab, [address], [place], [place] [address]  Protime-INR     Status: Abnormal   Collection Time: 05/21/20  3:33 PM  Result Value Ref Range   Prothrombin Time 17.7 (H) 11.4 -  15.2 seconds   INR 1.5 (H) 0.8 - 1.2    Comment: (NOTE) INR goal varies based on device and disease states. Performed at [facility] Lab, [address] [place], [place] [address]   APTT     Status: None   Collection Time: 05/21/20  3:33 PM  Result Value Ref Range   aPTT 31 24 - 36 seconds    Comment: Performed at [facility], [address] [place], [place] [address]  Type and screen [facility]     Status: None   Collection Time: 05/21/20  3:33 PM  Result Value Ref Range   ABO/RH(D) A POS    Antibody Screen NEG    Sample Expiration      05/24/2020,2359 Performed at [facility] Lab, [address], [place], [place] [address]   Resp Panel by RT-PCR (Flu A&B, Covid) Nasopharyngeal Swab     Status: None   Collection Time: 05/21/20  3:35 PM   Specimen: Nasopharyngeal Swab; Nasopharyngeal(NP) swabs in vial transport medium  Result Value Ref Range   SARS Coronavirus 2 by RT PCR NEGATIVE NEGATIVE    Comment: (NOTE) SARS-CoV-2 target nucleic acids are NOT DETECTED.  The SARS-CoV-2 RNA is generally detectable in upper respiratory specimens during the acute phase of infection. The lowest concentration of SARS-CoV-2 viral copies this assay can detect is 138 copies/mL. A negative result does not preclude SARS-Cov-2 infection and should not be used as the sole basis for treatment or other patient management decisions. A negative result may occur with  improper specimen collection/handling, submission of specimen other than  nasopharyngeal swab, presence of viral mutation(s) within the areas targeted by this assay, and inadequate number of viral copies(<138 copies/mL). A negative result must be combined with clinical observations, patient history, and epidemiological information. The expected result is Negative.  Fact Sheet for Patients:  BloggerCourse.com  Fact Sheet for Healthcare Providers:  SeriousBroker.it  This test is no t yet approved or cleared by the [place] FDA and  has been authorized for detection and/or diagnosis of SARS-CoV-2 by FDA under an Emergency Use Authorization (EUA). This EUA will remain  in effect (meaning this test can be used) for the duration of the COVID-19 declaration under Section 564(b)(1) of the Act, 21 U.S.C.section 360bbb-3(b)(1), unless the authorization is terminated  or revoked sooner.       Influenza A by PCR NEGATIVE NEGATIVE   Influenza B by PCR NEGATIVE NEGATIVE    Comment: (NOTE) The Xpert Xpress SARS-CoV-2/FLU/RSV plus assay is intended as an aid in the diagnosis of influenza from Nasopharyngeal swab specimens and should not be used as a sole basis for treatment. Nasal washings and aspirates are unacceptable for Xpert Xpress SARS-CoV-2/FLU/RSV testing.  Fact Sheet for Patients: BloggerCourse.com  Fact Sheet for Healthcare Providers: SeriousBroker.it  This test is not yet approved or cleared by the [place] FDA and has been authorized for detection and/or diagnosis of SARS-CoV-2 by FDA under an Emergency Use Authorization (EUA). This EUA will remain in effect (meaning this test can be used) for the duration of the COVID-19 declaration under Section 564(b)(1) of the Act, 21 U.S.C. section 360bbb-3(b)(1), unless the authorization is terminated or revoked.  Performed at [facility], [address] [place], [place] [address]   I-Stat  Chem 8, ED     Status: Abnormal   Collection Time: 05/21/20  3:41 PM  Result Value Ref Range   Sodium 143 135 -  145 mmol/L   Potassium 4.0 3.5 - 5.1 mmol/L   Chloride 100 98 - 111 mmol/L   BUN 30 (H) 8 - 23 mg/dL   Creatinine, Ser 7.82 (H) 0.61 - 1.24 mg/dL   Glucose, Bld 956 (H) 70 - 99 mg/dL    Comment: Glucose reference range applies only to samples taken after fasting for at least 8 hours.   Calcium, Ion 1.09 (L) 1.15 - 1.40 mmol/L   TCO2 29 22 - 32 mmol/L   Hemoglobin 9.9 (L) 13.0 - 17.0 g/dL   HCT 21.3 (L) 39 - 52 %  I-Stat venous blood gas, ED     Status: Abnormal   Collection Time: 05/21/20  3:44 PM  Result Value Ref Range   pH, Ven 7.382 7.25 - 7.43   pCO2, Ven 52.9 44 - 60 mmHg   pO2, Ven 45.0 32 - 45 mmHg   Bicarbonate 31.4 (H) 20.0 - 28.0 mmol/L   TCO2 33 (H) 22 - 32 mmol/L   O2 Saturation 79.0 %   Acid-Base Excess 5.0 (H) 0.0 - 2.0 mmol/L   Sodium 142 135 - 145 mmol/L   Potassium 4.0 3.5 - 5.1 mmol/L   Calcium, Ion 1.12 (L) 1.15 - 1.40 mmol/L   HCT 30.0 (L) 39 - 52 %   Hemoglobin 10.2 (L) 13.0 - 17.0 g/dL   Sample type VENOUS   Ammonia     Status: None   Collection Time: 05/21/20  3:46 PM  Result Value Ref Range   Ammonia 21 9 - 35 umol/L    Comment: Performed at [facility], [address], [place], [place] [address]  Blood Culture (routine x 2)     Status: None (Preliminary result)   Collection Time: 05/21/20  4:40 PM   Specimen: BLOOD  Result Value Ref Range   Specimen Description BLOOD SITE NOT SPECIFIED    Special Requests      BOTTLES DRAWN AEROBIC ONLY Blood Culture results may not be optimal due to an inadequate volume of blood received in culture bottles Performed at [facility], [address] [place], [place] [address]    Culture PENDING    Report Status PENDING   Lactic acid, plasma     Status: Abnormal   Collection Time: 05/21/20  4:40 PM  Result Value Ref Range   Lactic Acid, Venous 2.7 (HH) 0.5 - 1.9 mmol/L    Comment:  CRITICAL VALUE NOTED.  VALUE IS CONSISTENT WITH PREVIOUSLY REPORTED AND CALLED VALUE. Performed at [facility], [address], [place], [place] [address]   TSH     Status: None   Collection Time: 05/21/20  4:40 PM  Result Value Ref Range   TSH 1.839 0.350 - 4.500 uIU/mL    Comment: Performed by a 3rd Generation assay with a functional sensitivity of <=0.01 uIU/mL. Performed at [facility], [address], [place], [place] [address]   T4, free     Status: None   Collection Time: 05/21/20  4:40 PM  Result Value Ref Range   Free T4 1.10 0.61 - 1.12 ng/dL    Comment: (NOTE) Biotin ingestion may interfere with free T4 tests. If the results are inconsistent with the TSH level, previous test results, or the clinical presentation, then consider biotin interference. If needed, order repeat testing after stopping biotin. Performed at [facility], [address] [place], [place] [address]   Blood Culture (routine x 2)  Status: None (Preliminary result)   Collection Time: 05/21/20  4:42 PM   Specimen: BLOOD  Result Value Ref Range   Specimen Description BLOOD SITE NOT SPECIFIED    Special Requests      BOTTLES DRAWN AEROBIC ONLY Blood Culture results may not be optimal due to an inadequate volume of blood received in culture bottles Performed at [facility], [address], [place], [place] [address]    Culture PENDING    Report Status PENDING   Urinalysis, Routine w reflex microscopic Urine, Catheterized     Status: Abnormal   Collection Time: 05/21/20  5:22 PM  Result Value Ref Range   Color, Urine RED (A) YELLOW    Comment: BIOCHEMICALS MAY BE AFFECTED BY COLOR   APPearance CLOUDY (A) CLEAR   Specific Gravity, Urine 1.020 1.005 - 1.030   pH 7.0 5.0 - 8.0   Glucose, UA NEGATIVE NEGATIVE mg/dL   Hgb urine dipstick LARGE (A) NEGATIVE   Bilirubin Urine NEGATIVE NEGATIVE   Ketones, ur 15 (A) NEGATIVE mg/dL   Protein, ur >604 (A) NEGATIVE mg/dL    Nitrite POSITIVE (A) NEGATIVE   Leukocytes,Ua MODERATE (A) NEGATIVE    Comment: Performed at [facility], [address], [place], [place] [address]  Urinalysis, Microscopic (reflex)     Status: Abnormal   Collection Time: 05/21/20  5:22 PM  Result Value Ref Range   RBC / HPF >50 0 - 5 RBC/hpf   WBC, UA >50 0 - 5 WBC/hpf   Bacteria, UA MANY (A) NONE SEEN   Squamous Epithelial / LPF NONE SEEN 0 - 5   Mucus PRESENT     Comment: Performed at [facility], [address] [place], [place] [address]  Lactic acid, plasma     Status: Abnormal   Collection Time: 05/21/20  8:30 PM  Result Value Ref Range   Lactic Acid, Venous 2.2 (HH) 0.5 - 1.9 mmol/L    Comment: CRITICAL VALUE NOTED.  VALUE IS CONSISTENT WITH PREVIOUSLY REPORTED AND CALLED VALUE. Performed at [facility], [address] [place], [place] [address]   Protime-INR     Status: Abnormal   Collection Time: 05/22/20  5:07 AM  Result Value Ref Range   Prothrombin Time 17.9 (H) 11.4 - 15.2 seconds   INR 1.5 (H) 0.8 - 1.2    Comment: (NOTE) INR goal varies based on device and disease states. Performed at [facility], [address], [place], [place] [address]   Cortisol-am, blood     Status: Abnormal   Collection Time: 05/22/20  5:07 AM  Result Value Ref Range   Cortisol - AM 30.0 (H) 6.7 - 22.6 ug/dL    Comment: Performed at [facility], [address] [place], [place] [address]  Procalcitonin     Status: None   Collection Time: 05/22/20  5:07 AM  Result Value Ref Range   Procalcitonin 0.12 ng/mL    Comment:        Interpretation: PCT (Procalcitonin) <= 0.5 ng/mL: Systemic infection (sepsis) is not likely. Local bacterial infection is possible. (NOTE)       Sepsis PCT Algorithm           Lower Respiratory Tract                                      Infection PCT  Algorithm    ----------------------------     ----------------------------         PCT < 0.25 ng/mL                PCT < 0.10  ng/mL          Strongly encourage             Strongly discourage   discontinuation of antibiotics    initiation of antibiotics    ----------------------------     -----------------------------       PCT 0.25 - 0.50 ng/mL            PCT 0.10 - 0.25 ng/mL               OR       >80% decrease in PCT            Discourage initiation of                                            antibiotics      Encourage discontinuation           of antibiotics    ----------------------------     -----------------------------         PCT >= 0.50 ng/mL              PCT 0.26 - 0.50 ng/mL               AND        <80% decrease in PCT             Encourage initiation of                                             antibiotics       Encourage continuation           of antibiotics    ----------------------------     -----------------------------        PCT >= 0.50 ng/mL                  PCT > 0.50 ng/mL               AND         increase in PCT                  Strongly encourage                                      initiation of antibiotics    Strongly encourage escalation           of antibiotics                                     -----------------------------                                           PCT <= 0.25 ng/mL  OR                                        > 80% decrease in PCT                                      Discontinue / Do not initiate                                             antibiotics  Performed at [facility], [address], [place], [place] [address]   CBC     Status: Abnormal   Collection Time: 05/22/20  5:07 AM  Result Value Ref Range   WBC 14.1 (H) 4.0 - 10.5 K/uL   RBC 2.77 (L) 4.22 - 5.81 MIL/uL   Hemoglobin 8.1 (L) 13.0 - 17.0 g/dL   HCT 82.9 (L) 39 - 52 %   MCV 93.5 80.0 - 100.0 fL   MCH 29.2 26.0 - 34.0 pg   MCHC 31.3 30.0 - 36.0 g/dL   RDW 93.7 (H) 16.9 - 67.8 %   Platelets 179 150 - 400 K/uL   nRBC 0.0 0.0 -  0.2 %    Comment: Performed at [facility], [address], [place], [place] [address]  Comprehensive metabolic panel     Status: Abnormal   Collection Time: 05/22/20  5:07 AM  Result Value Ref Range   Sodium 137 135 - 145 mmol/L   Potassium 3.7 3.5 - 5.1 mmol/L   Chloride 103 98 - 111 mmol/L   CO2 25 22 - 32 mmol/L   Glucose, Bld 124 (H) 70 - 99 mg/dL    Comment: Glucose reference range applies only to samples taken after fasting for at least 8 hours.   BUN 24 (H) 8 - 23 mg/dL   Creatinine, Ser 1.75 (H) 0.61 - 1.24 mg/dL   Calcium 7.9 (L) 8.9 - 10.3 mg/dL   Total Protein 5.1 (L) 6.5 - 8.1 g/dL   Albumin 1.8 (L) 3.5 - 5.0 g/dL   AST 34 15 - 41 U/L   ALT 27 0 - 44 U/L   Alkaline Phosphatase 71 38 - 126 U/L   Total Bilirubin 0.6 0.3 - 1.2 mg/dL   GFR, Estimated 49 (L) >60 mL/min    Comment: (NOTE) Calculated using the CKD-EPI Creatinine Equation (2021)    Anion gap 9 5 - 15    Comment: Performed at [facility], [address] [place], [place] [address]  C-reactive protein     Status: None   Collection Time: 05/22/20  5:07 AM  Result Value Ref Range   CRP 0.7 <1.0 mg/dL    Comment: Performed at [facility] Lab, [address], [place], [place] [address]  Hemoglobin and hematocrit, blood     Status: Abnormal   Collection Time: 05/22/20  8:16 AM  Result Value Ref Range   Hemoglobin 7.7 (L) 13.0 - 17.0 g/dL   HCT 24.2 (L) 39 - 52 %    Comment: Performed at [facility], [address], [place], [place] [address]   CT Head Wo Contrast  Result Date: 05/21/2020 CLINICAL DATA:  Mental  status change. EXAM: CT HEAD WITHOUT CONTRAST TECHNIQUE: Contiguous axial images were obtained from the base of the skull through the vertex without intravenous contrast. COMPARISON:  May 03, 2020 FINDINGS: Brain: No evidence of acute infarction, hemorrhage, hydrocephalus, extra-axial collection or mass lesion/mass effect. Mild brain parenchymal volume loss and deep white  matter microangiopathy. Vascular: No hyperdense vessel or unexpected calcification. Skull: Normal. Negative for fracture or focal lesion. Sinuses/Orbits: No acute finding. Other: None. IMPRESSION: 1. No acute intracranial abnormality. 2. 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 Electronically Signed   By: Ted Mcalpine M.D.   On: 05/21/2020 17:53   DG Chest Port 1 View  Result Date: 05/21/2020 CLINICAL DATA:  Found unresponsive. EXAM: PORTABLE CHEST 1 VIEW COMPARISON:  May 08, 2020 FINDINGS: There is stable elevation of the right hemidiaphragm. Mild bilateral infrahilar atelectasis is seen. There is no evidence of a pleural effusion or pneumothorax. The heart size and mediastinal contours are within normal limits. The visualized skeletal structures are unremarkable. IMPRESSION: Mild bilateral infrahilar atelectasis. Electronically Signed   By: Aram Candela M.D.   On: 05/21/2020 16:34    Assessment/Plan HTN  History of C-spine surgery for Arnold-Chiari malformation through anterior approach in October 2021 by Dr. Lovell Sheehan, Subsequent fall at home with quadriplegia  UTI, hematuria, chronic indwelling foley  Chronic sacral decubitus ulcers POA - no signs/sxs of infection and no acute surgical needs - recommend daily PT hydrotherapy to attempt removal of overlying eschar  - [facility] will not follow, please call as needed    Adam Phenix, [name] Surgery Please see Amion for pager number during day hours 7:00am-4:30pm 05/22/2020, 10:02 AM

## 2020-05-22 NOTE — Progress Notes (Addendum)
PROGRESS NOTE                                                                                                                                                                                                             Patient Demographics:    Shannon [name], is a 74 y.o. male, DOB - [birth year], KVQ:[id number]  Outpatient Primary MD for the patient is McGowen, Maryjean Morn, MD    LOS - 1  Admit date - 05/21/2020    Chief Complaint  Patient presents with  . Unresponsive       Brief Narrative (HPI from H&P) - Shannon [name] is a 74 y.o. 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  Patient had anterior approach for the repair.  He then went home and had a fall from his bed, he was then hospitalized and from here he was sent to a nursing home, he has been quadriplegic since then, he has also developed sacral decubitus ulcers.   In the ER this admission he was found to have UTI, dehydration with found hypotension.  A Foley catheter was placed in the ER however Foley did not yield any urine output but frank blood.  I think the Foley balloon was inflated in the urethra.  Balloon was deflated, Foley was flushed with 800 cc of urine outflow which was frankly bloody with multiple clots.  This was done in the ER on 05/22/2020 after I saw the patient.    Subjective:    Shannon [name] today has, No headache, No chest pain, No abdominal pain - No Nausea, No new weakness tingling or numbness, denies SOB.   Assessment  & Plan :     1. AMS due to UTI, sepsis and hypotension in a patient with indwelling Foley catheter POA and sacral decubitus ulcers POA who is quadriplegic.  Head CT unremarkable, patient getting empiric IV antibiotics, IV fluids and supportive care.  Mentation has improved somewhat.  We will continue to monitor.  2.   Indwelling Foley catheter, urethral injury with Foley catheter balloon being inflated in the urethra noted on 05/22/2020 morning.  Foley catheter was replaced properly and flushed, 800 cc of urine output with clots and blood noted on 05/22/2020.  We will continue to flush every 4 hours.  3.  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 Lovell Sheehan.  Subsequent fall at home with progression to quadriplegia.  Supportive care.  4.  Chronic sacral decubitus ulcers POA.  Wound care on board, multiple ulcers unstageable picture as below, general surgery will be consulted for any debridement if needed.  5.  Essential hypertension.  Blood pressure low hold blood pressure medications.  6.  Chronic Eliquis use.  This was started for DVT prophylaxis at the rehab, will confirm with family and resume if needed for now heparin for DVT prophylax.   Condition - Extremely Guarded  Family Communication  : daughter 05/22/20 - she is upset that he is getting in and out of the hospital and nothing is being done, informed her that I just saw her for the first time on 05/22/20.  Code Status :  Full  Consults  :  N Surg, CCS  Procedures  :    CT head - Non acute.  PUD Prophylaxis :    Disposition Plan  :    Status is: Inpatient  Remains inpatient appropriate because:Inpatient level of care appropriate due to severity of illness   Dispo: The patient is from: SNF              Anticipated d/c is to: SNF              Anticipated d/c date is: > 3 days              Patient currently is not medically stable to d/c.   DVT Prophylaxis  :   Heparin   Lab Results  Component Value Date   PLT 179 05/22/2020    Diet :  Diet Order            Diet Heart Room service appropriate? Yes; Fluid consistency: Thin  Diet effective now                  Inpatient  Medications  Scheduled Meds: . collagenase   Topical Daily  . heparin  5,000 Units Subcutaneous Q8H   Continuous Infusions: . ceFEPime (MAXIPIME) IV Stopped (05/22/20 0603)  . lactated ringers 125 mL/hr at 05/22/20 0711   PRN Meds:.ketorolac, [DISCONTINUED] ondansetron **OR** ondansetron (ZOFRAN) IV  Antibiotics  :    Anti-infectives (From admission, onward)   Start     Dose/Rate Route Frequency Ordered Stop   05/22/20 0500  ceFEPIme (MAXIPIME) 2 g in sodium chloride 0.9 % 100 mL IVPB        2 g 200 mL/hr over 30 Minutes Intravenous Every 12 hours 05/21/20 1614     05/21/20 2015  aztreonam (AZACTAM) 2 g in sodium chloride 0.9 % 100 mL IVPB        2 g 200 mL/hr over 30 Minutes Intravenous  Once 05/21/20 2010 05/21/20 2230   05/21/20 1730  vancomycin (VANCOREADY) IVPB 1750 mg/350 mL        1,750 mg 175 mL/hr over 120 Minutes Intravenous  Once 05/21/20 1722 05/21/20 2148   05/21/20 1545  ceFEPIme (MAXIPIME) 2 g in sodium chloride 0.9 % 100 mL IVPB        2 g 200 mL/hr over 30 Minutes Intravenous  Once 05/21/20 1535 05/21/20 1704       Time Spent in minutes  30   Susa Raring M.D on 05/22/2020 at 8:41 AM  To page go to [email or web address] -  password [facility]  [facility] -  Office  [phone]    See all Orders from today for further details    Objective:   Vitals:   05/22/20 0730 05/22/20 0756 05/22/20 0800 05/22/20 0806  BP: 118/69 (!) 96/57 (!) 88/61 102/61  Pulse: 89 99 86 90  Resp: 13 16 15 12   Temp: 98.9 F (37.2 C) (!) 96.6 F (35.9 C) (!) 97.1 F (36.2 C) (!) 97.5 F (36.4 C)  TempSrc:      SpO2: 94% 92% 94% 95%  Weight:      Height:        Wt Readings from Last 3 Encounters:  05/22/20 120.7 kg  05/03/20 84.7 kg  04/22/20 91 kg     Intake/Output Summary (Last 24 hours) at 05/22/2020 0841 Last data filed at 05/21/2020 2325 Gross per 24 hour  Intake 3350 ml  Output --  Net 3350 ml     Physical Exam  Awake but tired, will answer basic  questions, functional quadriparesis at baseline, wearing c-collar Foley catheter with blood in the bag, Shannon [name],Shannon [name] Supple Neck,No JVD, No cervical lymphadenopathy appriciated.  Symmetrical Chest wall movement, Good air movement bilaterally, CTAB RRR,No Gallops,Rubs or new Murmurs, No Parasternal Heave +ve B.Sounds, Abd Soft, No tenderness, No organomegaly appriciated, No rebound - guarding or rigidity. No Cyanosis,          Data Review:    CBC Recent Labs  Lab 05/21/20 1533 05/21/20 1541 05/21/20 1544 05/22/20 0507 05/22/20 0816  WBC 12.4*  --   --  14.1*  --   HGB 9.4* 9.9* 10.2* 8.1* 7.7*  HCT 30.9* 29.0* 30.0* 25.9* 25.1*  PLT 187  --   --  179  --   MCV 94.2  --   --  93.5  --   MCH 28.7  --   --  29.2  --   MCHC 30.4  --   --  31.3  --   RDW 15.7*  --   --  15.8*  --   LYMPHSABS 1.1  --   --   --   --   MONOABS 1.1*  --   --   --   --   EOSABS 0.0  --   --   --   --   BASOSABS 0.0  --   --   --   --     Recent Labs  Lab 05/21/20 1533 05/21/20 1541 05/21/20 1544 05/21/20 1546 05/21/20 1640 05/21/20 2030 05/22/20 0507  NA 140 143 142  --   --   --  137  K 4.1 4.0 4.0  --   --   --  3.7  CL 102 100  --   --   --   --  103  CO2 28  --   --   --   --   --  25  GLUCOSE 174* 171*  --   --   --   --  124*  BUN 25* 30*  --   --   --   --  24*  CREATININE 2.06* 2.00*  --   --   --   --  1.50*  CALCIUM 8.0*  --   --   --   --   --  7.9*  AST 42*  --   --   --   --   --  34  ALT 32  --   --   --   --   --  27  ALKPHOS 83  --   --   --   --   --  71  BILITOT 0.4  --   --   --   --   --  0.6  ALBUMIN 1.8*  --   --   --   --   --  1.8*  CRP  --   --   --   --   --   --  0.7  PROCALCITON  --   --   --   --   --   --  0.12  LATICACIDVEN 3.4*  --   --   --  2.7* 2.2*  --   INR 1.5*  --   --   --   --   --  1.5*  TSH  --   --   --   --  1.839  --   --   AMMONIA  --   --   --  21  --   --   --      ------------------------------------------------------------------------------------------------------------------ No results for input(s): CHOL, HDL, LDLCALC, TRIG, CHOLHDL, LDLDIRECT in the last 72 hours.  No results found for: HGBA1C ------------------------------------------------------------------------------------------------------------------ Recent Labs    05/21/20 1640  TSH 1.839    Cardiac Enzymes No results for input(s): CKMB, TROPONINI, MYOGLOBIN in the last 168 hours.  Invalid input(s): CK ------------------------------------------------------------------------------------------------------------------    Component Value Date/Time   BNP 345.1 (H) 05/04/2020 0001    Micro Results Recent Results (from the past 240 hour(s))  Resp Panel by RT-PCR (Flu A&B, Covid) Nasopharyngeal Swab     Status: None   Collection Time: 05/21/20  3:35 PM   Specimen: Nasopharyngeal Swab; Nasopharyngeal(NP) swabs in vial transport medium  Result Value Ref Range Status   SARS Coronavirus 2 by RT PCR NEGATIVE NEGATIVE Final    Comment: (NOTE) SARS-CoV-2 target nucleic acids are NOT DETECTED.  The SARS-CoV-2 RNA is generally detectable in upper respiratory specimens during the acute phase of infection. The lowest concentration of SARS-CoV-2 viral copies this assay can detect is 138 copies/mL. A negative result does not preclude SARS-Cov-2 infection and should not be used as the sole basis for treatment or other patient management decisions. A negative result may occur with  improper specimen collection/handling, submission of specimen other than nasopharyngeal swab, presence of viral mutation(s) within the areas targeted by this assay, and inadequate number of viral copies(<138 copies/mL). A negative result must be combined with clinical observations, patient history, and epidemiological information. The expected result is Negative.  Fact Sheet for Patients:   BloggerCourse.com  Fact Sheet for Healthcare Providers:  SeriousBroker.it  This test is no t yet approved or cleared by the [place] FDA and  has been authorized for detection and/or diagnosis of SARS-CoV-2 by FDA under an Emergency Use Authorization (EUA). This EUA will remain  in effect (meaning this test can be used) for the duration of the COVID-19 declaration under Section 564(b)(1) of the Act, 21 U.S.C.section 360bbb-3(b)(1), unless the authorization is terminated  or revoked sooner.       Influenza A by PCR NEGATIVE NEGATIVE Final   Influenza B by PCR NEGATIVE NEGATIVE Final    Comment: (NOTE) The Xpert Xpress SARS-CoV-2/FLU/RSV plus assay is intended as an aid in the diagnosis of influenza from Nasopharyngeal swab specimens and should not be used as a sole basis for treatment. Nasal washings and aspirates are unacceptable for Xpert Xpress SARS-CoV-2/FLU/RSV testing.  Fact Sheet for Patients: BloggerCourse.com  Fact  Sheet for Healthcare Providers: SeriousBroker.it  This test is not yet approved or cleared by the [place] and has been authorized for detection and/or diagnosis of SARS-CoV-2 by FDA under an Emergency Use Authorization (EUA). This EUA will remain in effect (meaning this test can be used) for the duration of the COVID-19 declaration under Section 564(b)(1) of the Act, 21 U.S.C. section 360bbb-3(b)(1), unless the authorization is terminated or revoked.  Performed at [facility], [address], [place], [place] [address]   Blood Culture (routine x 2)     Status: None (Preliminary result)   Collection Time: 05/21/20  4:40 PM   Specimen: BLOOD  Result Value Ref Range Status   Specimen Description BLOOD SITE NOT SPECIFIED  Final   Special Requests   Final    BOTTLES DRAWN AEROBIC ONLY Blood Culture results may not be optimal due to an  inadequate volume of blood received in culture bottles Performed at [facility], [address], [place], [place] [address]    Culture PENDING  Incomplete   Report Status PENDING  Incomplete  Blood Culture (routine x 2)     Status: None (Preliminary result)   Collection Time: 05/21/20  4:42 PM   Specimen: BLOOD  Result Value Ref Range Status   Specimen Description BLOOD SITE NOT SPECIFIED  Final   Special Requests   Final    BOTTLES DRAWN AEROBIC ONLY Blood Culture results may not be optimal due to an inadequate volume of blood received in culture bottles Performed at [facility], [address] [place], [place] [address]    Culture PENDING  Incomplete   Report Status PENDING  Incomplete    Radiology Reports DG Cervical Spine 1 View  Result Date: 04/26/2020 CLINICAL DATA:  Status post surgical spinal fusion. EXAM: DG CERVICAL SPINE - 1 VIEW COMPARISON:  Radiographs of the cervical spine 04/22/2020. FINDINGS: A single lateral view radiograph of the cervical spine is submitted. Redemonstrated sequela of prior C3-C5 ACDF with ventral plate and screw fixation. Redemonstrated interbody device at the C4-C5 level. IMPRESSION: Single lateral view radiograph of the cervical spine as above. Electronically Signed   By: Jackey Loge DO   On: 04/26/2020 14:58   CT Head Wo Contrast  Result Date: 05/21/2020 CLINICAL DATA:  Mental status change. EXAM: CT HEAD WITHOUT CONTRAST TECHNIQUE: Contiguous axial images were obtained from the base of the skull through the vertex without intravenous contrast. COMPARISON:  May 03, 2020 FINDINGS: Brain: No evidence of acute infarction, hemorrhage, hydrocephalus, extra-axial collection or mass lesion/mass effect. Mild brain parenchymal volume loss and deep white matter microangiopathy. Vascular: No hyperdense vessel or unexpected calcification. Skull: Normal. Negative for fracture or focal lesion. Sinuses/Orbits: No acute finding. Other: None.  IMPRESSION: 1. No acute intracranial abnormality. 2. 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 Electronically Signed   By: Ted Mcalpine M.D.   On: 05/21/2020 17:53   CT Head Wo Contrast  Result Date: 05/03/2020 CLINICAL DATA:  Facial trauma after multiple falls. EXAM: CT HEAD WITHOUT CONTRAST CT CERVICAL SPINE WITHOUT CONTRAST TECHNIQUE: Multidetector CT imaging of the head and cervical spine was performed following the standard protocol without intravenous contrast. Multiplanar CT image reconstructions of the cervical spine were also generated. COMPARISON:  April 21, 2020. FINDINGS: CT HEAD FINDINGS Brain: Mild chronic ischemic white matter disease is noted. No mass effect or midline shift is noted. Ventricular size is within normal limits. There is no evidence of mass  lesion, hemorrhage or acute infarction. Vascular: No hyperdense vessel or unexpected calcification. Skull: Normal. Negative for fracture or focal lesion. Sinuses/Orbits: No acute finding. Other: None. CT CERVICAL SPINE FINDINGS Alignment: Normal. Skull base and vertebrae: No acute fracture is noted. Postsurgical changes are seen involving the C3, C4 and C5 vertebral bodies. Soft tissues and spinal canal: No prevertebral fluid or swelling. No visible canal hematoma. Disc levels: Status post surgical anterior fusion of C3-4 and C4-5. There is fusion of the C5-6 and C6-7 disc spaces secondary to degenerative change. Upper chest: Negative. Other: None. IMPRESSION: 1. Mild chronic ischemic white matter disease. No acute intracranial abnormality seen. 2. Postsurgical and degenerative changes as described above. No acute abnormality seen in the cervical spine. Electronically Signed   By: Lupita Raider M.D.   On: 05/03/2020 14:41   CT Chest Wo Contrast  Result Date: 05/03/2020 CLINICAL DATA:  Short of breath, fever, sepsis, multiple falls EXAM: CT CHEST WITHOUT CONTRAST TECHNIQUE: Multidetector CT imaging of the  chest was performed following the standard protocol without IV contrast. COMPARISON:  05/03/2020 FINDINGS: Cardiovascular: Unenhanced imaging of the heart and great vessels demonstrates trace pericardial fluid. The heart is not enlarged. Aneurysmal dilatation of the ascending thoracic aorta measuring up to 4.4 cm. Evaluation of the vascular lumen is limited without intravenous contrast. Mediastinum/Nodes: No enlarged mediastinal or axillary lymph nodes. Thyroid gland, trachea, and esophagus demonstrate no significant findings. Lungs/Pleura: There is diffuse bronchial wall thickening. Complete opacification of the bronchus intermedius and right middle and right lower lobe bronchi are seen, along with partial opacification of the left lower lobe bronchus. There is patchy airspace disease within the right upper and left lower lobes. Complete consolidation of the right middle and right lower lobes with associated volume loss consistent with atelectasis. No effusion or pneumothorax. Upper Abdomen: Calcified gallstone is identified. The remainder of the upper abdomen is unremarkable. Musculoskeletal: Severe left shoulder osteoarthritis. No acute or destructive bony lesions. Reconstructed images demonstrate no additional findings. IMPRESSION: 1. Diffuse bronchial wall thickening, with patchy airspace disease of the right upper and left lower lobes, consistent with bronchopneumonia. 2. Opacification of the right middle, right lower, and left lower lobe bronchi, with complete collapse of the right middle and right lower lobes. This may be related to mucous plugging or underlying infection. 3. Trace pericardial fluid. 4. Cholelithiasis without cholecystitis. 5. 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 Recommend annual imaging followup by CTA or MRA. This recommendation follows 2010 ACCF/AHA/AATS/ACR/ASA/SCA/SCAI/SIR/STS/SVM Guidelines for the Diagnosis and Management of Patients with Thoracic  Aortic Disease. Circulation. 2010; 121: Z610-R604. Aortic aneurysm NOS (ICD10-I71.9) Electronically Signed   By: Sharlet Salina M.D.   On: 05/03/2020 16:31   CT Cervical Spine Wo Contrast  Result Date: 05/03/2020 CLINICAL DATA:  Facial trauma after multiple falls. EXAM: CT HEAD WITHOUT CONTRAST CT CERVICAL SPINE WITHOUT CONTRAST TECHNIQUE: Multidetector CT imaging of the head and cervical spine was performed following the standard protocol without intravenous contrast. Multiplanar CT image reconstructions of the cervical spine were also generated. COMPARISON:  April 21, 2020. FINDINGS: CT HEAD FINDINGS Brain: Mild chronic ischemic white matter disease is noted. No mass effect or midline shift is noted. Ventricular size is within normal limits. There is no evidence of mass lesion, hemorrhage or acute infarction. Vascular: No hyperdense vessel or unexpected calcification. Skull: Normal. Negative for fracture or focal lesion. Sinuses/Orbits: No acute finding. Other: None. CT CERVICAL SPINE FINDINGS Alignment: Normal. Skull base and vertebrae: No acute fracture  is noted. Postsurgical changes are seen involving the C3, C4 and C5 vertebral bodies. Soft tissues and spinal canal: No prevertebral fluid or swelling. No visible canal hematoma. Disc levels: Status post surgical anterior fusion of C3-4 and C4-5. There is fusion of the C5-6 and C6-7 disc spaces secondary to degenerative change. Upper chest: Negative. Other: None. IMPRESSION: 1. Mild chronic ischemic white matter disease. No acute intracranial abnormality seen. 2. Postsurgical and degenerative changes as described above. No acute abnormality seen in the cervical spine. Electronically Signed   By: Lupita Raider M.D.   On: 05/03/2020 14:41   DG Chest Port 1 View  Result Date: 05/21/2020 CLINICAL DATA:  Found unresponsive. EXAM: PORTABLE CHEST 1 VIEW COMPARISON:  May 08, 2020 FINDINGS: There is stable elevation of the right hemidiaphragm. Mild  bilateral infrahilar atelectasis is seen. There is no evidence of a pleural effusion or pneumothorax. The heart size and mediastinal contours are within normal limits. The visualized skeletal structures are unremarkable. IMPRESSION: Mild bilateral infrahilar atelectasis. Electronically Signed   By: Aram Candela M.D.   On: 05/21/2020 16:34   DG Chest Port 1 View  Result Date: 05/08/2020 CLINICAL DATA:  Mucous plugging EXAM: PORTABLE CHEST 1 VIEW COMPARISON:  Two days ago FINDINGS: Low volume chest with opacity at both lung bases. Air bronchograms seen on the left. No edema, effusion, or pneumothorax. Normal heart size IMPRESSION: Continued volume loss with extensive atelectasis at the bases. Electronically Signed   By: Marnee Spring M.D.   On: 05/08/2020 06:46   DG Chest Port 1 View  Result Date: 05/06/2020 CLINICAL DATA:  Shortness of breath EXAM: PORTABLE CHEST 1 VIEW COMPARISON:  May 05, 2020 FINDINGS: Persistent right pleural effusion with areas of atelectatic change in the lung bases noted. Heart size and pulmonary vascularity are normal. No adenopathy. No bone lesions. IMPRESSION: Stable right pleural effusion with bibasilar atelectasis. A degree of superimposed infiltrate in the right base cannot be excluded. Stable cardiac silhouette. Electronically Signed   By: Bretta Bang III M.D.   On: 05/06/2020 08:29   DG Chest Port 1 View  Result Date: 05/05/2020 CLINICAL DATA:  Respiratory distress. EXAM: PORTABLE CHEST 1 VIEW COMPARISON:  05/03/2020 FINDINGS: The cardiac silhouette, mediastinal and hilar contours are within normal limits and stable. Moderate-sized right pleural effusion with overlying atelectasis or infiltrate. Patchy left basilar infiltrate. IMPRESSION: Moderate-sized right pleural effusion with overlying atelectasis or infiltrate. Patchy left basilar infiltrate. Electronically Signed   By: Rudie Meyer M.D.   On: 05/05/2020 07:15   DG CHEST PORT 1 VIEW  Result  Date: 05/04/2020 CLINICAL DATA:  Respiratory distress EXAM: PORTABLE CHEST 1 VIEW COMPARISON:  05/03/2020 FINDINGS: Unchanged right basilar consolidation. Cardiomediastinal contours and pleural spaces are normal. Left lung is clear. IMPRESSION: Unchanged right basilar consolidation. Electronically Signed   By: Deatra Robinson M.D.   On: 05/04/2020 00:07   DG Chest Port 1 View  Result Date: 05/03/2020 CLINICAL DATA:  Questionable sepsis. EXAM: PORTABLE CHEST 1 VIEW COMPARISON:  01/09/2020. FINDINGS: The heart size and mediastinal contours are within normal limits. Low lung volumes. Left basilar opacities. No visible pleural effusions or pneumothorax. Elevated right hemidiaphragm. No acute osseous abnormality. Bilateral shoulder degenerative change. Partially imaged cervical ACDF. IMPRESSION: Low lung volumes with left basilar opacities, which may represent atelectasis, aspiration and/or pneumonia. Dedicated PA and lateral radiographs could better characterize if clinically indicated. Electronically Signed   By: Feliberto Harts MD   On: 05/03/2020 13:12   DG Swallowing  Func-Speech Pathology  Result Date: 05/04/2020 Objective Swallowing Evaluation: Type of Study: Bedside Swallow Evaluation  Patient Details Name: MUREL SHENBERGER MRN: [id number] Date of Birth: 03/22/1946 Today's Date: 05/04/2020 Time: SLP Start Time (ACUTE ONLY): 1044 -SLP Stop Time (ACUTE ONLY): 1103 SLP Time Calculation (min) (ACUTE ONLY): 19 min Past Medical History: Past Medical History: Diagnosis Date . Asthma  . 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 Newell Coral, 1992-->surg at [facility].  Summer 2021->Cervicalgia,arm pain, hand atrophy RUE wkness, hyperreflex (Dr. 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 Hay fever  . Hypertension  . Osteoarthritis of both hands  Past Surgical History: Past Surgical  History: Procedure Laterality Date . ANTERIOR CERVICAL DECOMP/DISCECTOMY FUSION N/A 04/05/2020  Procedure: ANTERIOR CERVICAL DECOMPRESSION/DISCECTOMY FUSION, INTERBODY PROSTHESIS, PLATE/SCREWS CERVICAL THREE-CERVICAL FOUR, CERVICAL FOUR- CERVICAL FIVE;  Surgeon: Tressie Stalker, MD;  Location: [facility] OR;  Service: Neurosurgery;  Laterality: N/A; . ANTERIOR CERVICAL DECOMP/DISCECTOMY FUSION N/A 04/22/2020  Procedure: Reexploration of anterior cervical wound for epidural hematoma;  Surgeon: Donalee Citrin, MD;  Location: [facility] OR;  Service: Neurosurgery;  Laterality: N/A; . BACK SURGERY   . INCISION AND DRAINAGE Left 2012  L hand infection . ROTATOR CUFF REPAIR Right   x 3 . SPINE SURGERY   HPI: 74 year old white male who presented with right lower lobe pneumonia and known history of  Quadriparesis.  He originally had a C3-4 [place] 4-5 anterior cervical disc asked to me with decompression, C3 3-4 and C4-5 interbody arthrodesis with local autograft bone, anterior cervical plating of C3-C5 on 04/05/2020.  He then went to rehab and was discharged.  On 04/21/2020 the patient presented to the emergency room with progressive bilateral quadriplegia.  Patient went to the operating room and had decompression of a vertebral hematoma.  And been discharged on 11 10/2019 and readmitted on 05/03/2020 for increasing shortness of breath and productive cough.  Chest x-ray demonstrated a new right lower lobe infiltrate with compressive atelectasis, concerning for aspiration pneumonia.  Pt had MBSS during most recent admission on 04/25/2020 with recommendations for puree and thin liquids, advanced to Dys3 prior to discharge  Subjective: alert, cooperative, pleasant, participative. Assessment / Plan / Recommendation CHL IP CLINICAL IMPRESSIONS 05/04/2020 Clinical Impression Pt presents with moderate oropharyngeal dysphagia c/b decreased base of tongue retraction, delayed swallow inititation, incomplete laryngeal closure, reduced pharyngeal perstalsis,  decreased UES opening, and diminished sensation. These deficits resulted in silent aspiration of thin liquid and nectar thick liquid by straw prior to the swallow. With nectar thick liquid by cup, only transient penetration was seen. There was significant pharyngeal residue with puree and solid consistencies which was reduced but not fully cleared with liquid wash. There is noticeable prevertebral edema in pharynx which appears to be impacting swallow function, but pt is able to acheive adequate epiglottic deflection. This study represents a marked decline in swallow function from MBSS on 04/25/20; however, from review of images in both studies, amount of edema appears comparable to naked eye without any accurate measurement, or comparing level of magnification during each study. Because of recent ACDF (11/13) and revision (10/29), and presence of cervical collar, SLP intervention options are limited outside of modification of diet. Pt cannot trial compensatory strategies requiring change to head/neck positioning, and many swallow exercises cannot be executed in this position. Pt may benefit from some tongue strengthening exercises to improve swallow drive and decrease vallecula residue. Recommend initiating  full liquid diet with nectar thick liquids, by cup sip only. NO STRAWS. Please crush medications if permissible. Called neice, Malachi BondsGloria, and shared results of today's study which she will pass on to pt's daughter who will be coming to town tomorrow. SLP Visit Diagnosis -- Attention and concentration deficit following -- Frontal lobe and executive function deficit following -- Impact on safety and function --   CHL IP TREATMENT RECOMMENDATION 05/04/2020 Treatment Recommendations Therapy as outlined in treatment plan below   Prognosis 05/04/2020 Prognosis for Safe Diet Advancement Fair Barriers to Reach Goals Cognitive deficits Barriers/Prognosis Comment -- CHL IP DIET RECOMMENDATION 05/04/2020 SLP Diet  Recommendations Nectar thick liquid Liquid Administration via No straw Medication Administration -- Compensations Slow rate;Small sips/bites;Follow solids with liquid Postural Changes Seated upright at 90 degrees;Remain semi-upright after after feeds/meals (Comment)   CHL IP OTHER RECOMMENDATIONS 05/04/2020 Recommended Consults -- Oral Care Recommendations Oral care BID Other Recommendations --   CHL IP FOLLOW UP RECOMMENDATIONS 05/04/2020 Follow up Recommendations (No Data)   CHL IP FREQUENCY AND DURATION 05/04/2020 Speech Therapy Frequency (ACUTE ONLY) min 2x/week Treatment Duration 2 weeks      CHL IP ORAL PHASE 05/04/2020 Oral Phase -- Oral - Pudding Teaspoon -- Oral - Pudding Cup -- Oral - Honey Teaspoon -- Oral - Honey Cup -- Oral - Nectar Teaspoon -- Oral - Nectar Cup WFL Oral - Nectar Straw Premature spillage;Decreased bolus cohesion Oral - Thin Teaspoon -- Oral - Thin Cup Premature spillage Oral - Thin Straw Premature spillage Oral - Puree WFL Oral - Mech Soft WFL Oral - Regular -- Oral - Multi-Consistency -- Oral - Pill WFL Oral Phase - Comment --  CHL IP PHARYNGEAL PHASE 05/04/2020 Pharyngeal Phase Impaired Pharyngeal- Pudding Teaspoon -- Pharyngeal -- Pharyngeal- Pudding Cup -- Pharyngeal -- Pharyngeal- Honey Teaspoon -- Pharyngeal -- Pharyngeal- Honey Cup -- Pharyngeal -- Pharyngeal- Nectar Teaspoon -- Pharyngeal -- Pharyngeal- Nectar Cup -- Pharyngeal -- Pharyngeal- Nectar Straw -- Pharyngeal -- Pharyngeal- Thin Teaspoon -- Pharyngeal -- Pharyngeal- Thin Cup -- Pharyngeal -- Pharyngeal- Thin Straw -- Pharyngeal -- Pharyngeal- Puree -- Pharyngeal -- Pharyngeal- Mechanical Soft -- Pharyngeal -- Pharyngeal- Regular -- Pharyngeal -- Pharyngeal- Multi-consistency -- Pharyngeal -- Pharyngeal- Pill -- Pharyngeal -- Pharyngeal Comment --  CHL IP CERVICAL ESOPHAGEAL PHASE 05/04/2020 Cervical Esophageal Phase Impaired Pudding Teaspoon -- Pudding Cup -- Honey Teaspoon -- Honey Cup -- Nectar Teaspoon -- Nectar Cup  Reduced cricopharyngeal relaxation Nectar Straw Reduced cricopharyngeal relaxation Thin Teaspoon -- Thin Cup Reduced cricopharyngeal relaxation Thin Straw Reduced cricopharyngeal relaxation Puree Reduced cricopharyngeal relaxation Mechanical Soft Reduced cricopharyngeal relaxation Regular -- Multi-consistency -- Pill Reduced cricopharyngeal relaxation Cervical Esophageal Comment trace esophageal retention of contrast Kerrie PleasureLeigh E Borum, MA, CCC-SLP Acute Rehabilitation Services Office: [phone] 05/04/2020, 11:59 AM              DG Swallowing Func-Speech Pathology  Result Date: 04/25/2020 Objective Swallowing Evaluation: Type of Study: MBS-Modified Barium Swallow Study  Patient Details Name: Vernice JeffersonRonald R Erichsen MRN: [id number] Date of Birth: March 24, 1946 Today's Date: 04/25/2020 Time: SLP Start Time (ACUTE ONLY): 1043 -SLP Stop Time (ACUTE ONLY): 1057 SLP Time Calculation (min) (ACUTE ONLY): 14 min Past Medical History: Past Medical History: Diagnosis Date . Asthma  . 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 Newell CoralNudelman, 1992-->[facility]  Summer 2021->Cervicalgia,arm pain, hand atrophy RUE wkness, hyperreflex (Dr. 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 Hay  fever  . Hypertension  . Osteoarthritis of both hands  Past Surgical History: Past Surgical History: Procedure Laterality Date . ANTERIOR CERVICAL DECOMP/DISCECTOMY FUSION N/A 04/05/2020  Procedure: ANTERIOR CERVICAL DECOMPRESSION/DISCECTOMY FUSION, INTERBODY PROSTHESIS, PLATE/SCREWS CERVICAL THREE-CERVICAL FOUR, CERVICAL FOUR- CERVICAL FIVE;  Surgeon: Tressie Stalker, MD;  Location: [facility] OR;  Service: Neurosurgery;  Laterality: N/A; . ANTERIOR CERVICAL DECOMP/DISCECTOMY FUSION N/A 04/22/2020  Procedure: Reexploration of anterior cervical wound for epidural hematoma;  Surgeon: Donalee Citrin, MD;  Location: [facility] OR;  Service:  Neurosurgery;  Laterality: N/A; . BACK SURGERY   . INCISION AND DRAINAGE Left 2012  L hand infection . ROTATOR CUFF REPAIR Right   x 3 . SPINE SURGERY   HPI: Pt is a 74 y.o. 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 Pt s/p evacuation of hemoatoma, removal of hardware and re-implantation of titanium cage packed with autograft bone on 04/22/20.  Pt in hard collar post op. CT head 10/29 negative for acute changes.  Subjective: alert, cooperative Assessment / Plan / Recommendation CHL IP CLINICAL IMPRESSIONS 04/25/2020 Clinical Impression Pt presents with a mild oropharyngeal swallow but with no aspiration or penetration observed. He has reduced posterior propulsion orally with mildly slow mastication and lingual rocking with solids. Lingual residue c/b a mild coating on the tongue is also noted with solids. His base of tongue retraction is mildly reduced, leading to mild vallecular residue with solids. His UES is opening also appears to be mildly reduced, which could be impacted by location of cervical hardware. Recommend continuing current diet with SLP f/u for advancement to more solid textures, which can be done clinically.  SLP Visit Diagnosis Dysphagia, oropharyngeal phase (R13.12) Attention and concentration deficit following -- Frontal lobe and executive function deficit following -- Impact on safety and function Mild aspiration risk   CHL IP TREATMENT RECOMMENDATION 04/25/2020 Treatment Recommendations Therapy as outlined in treatment plan below   Prognosis 04/25/2020 Prognosis for Safe Diet Advancement Good Barriers to Reach Goals Cognitive deficits Barriers/Prognosis Comment -- CHL IP DIET RECOMMENDATION 04/25/2020 SLP Diet Recommendations Dysphagia 1  (Puree) solids;Thin liquid Liquid Administration via Cup;Straw Medication Administration Whole meds with puree Compensations Slow rate;Small sips/bites Postural Changes Seated upright at 90 degrees   CHL IP OTHER RECOMMENDATIONS 04/25/2020 Recommended Consults -- Oral Care Recommendations Oral care BID Other Recommendations --   CHL IP FOLLOW UP RECOMMENDATIONS 04/25/2020 Follow up Recommendations Skilled Nursing facility   [facility] IP FREQUENCY AND DURATION 04/25/2020 Speech Therapy Frequency (ACUTE ONLY) min 2x/week Treatment Duration 2 weeks      CHL IP ORAL PHASE 04/25/2020 Oral Phase Impaired Oral - Pudding Teaspoon -- Oral - Pudding Cup -- Oral - Honey Teaspoon -- Oral - Honey Cup -- Oral - Nectar Teaspoon -- Oral - Nectar Cup -- Oral - Nectar Straw -- Oral - Thin Teaspoon -- Oral - Thin Cup Reduced posterior propulsion Oral - Thin Straw Reduced posterior propulsion Oral - Puree Reduced posterior propulsion;Delayed oral transit;Lingual/palatal residue Oral - Mech Soft Reduced posterior propulsion;Delayed oral transit;Lingual/palatal residue;Impaired mastication Oral - Regular -- Oral - Multi-Consistency -- Oral - Pill Reduced posterior propulsion Oral Phase - Comment --  CHL IP PHARYNGEAL PHASE 04/25/2020 Pharyngeal Phase Impaired Pharyngeal- Pudding Teaspoon -- Pharyngeal -- Pharyngeal- Pudding Cup -- Pharyngeal -- Pharyngeal- Honey Teaspoon --  Pharyngeal -- Pharyngeal- Honey Cup -- Pharyngeal -- Pharyngeal- Nectar Teaspoon -- Pharyngeal -- Pharyngeal- Nectar Cup -- Pharyngeal -- Pharyngeal- Nectar Straw -- Pharyngeal -- Pharyngeal- Thin Teaspoon -- Pharyngeal -- Pharyngeal- Thin Cup Reduced tongue base retraction;Pharyngeal residue - valleculae Pharyngeal -- Pharyngeal- Thin Straw Reduced tongue base retraction;Pharyngeal residue - valleculae Pharyngeal -- Pharyngeal- Puree Reduced tongue base retraction;Pharyngeal residue - valleculae Pharyngeal -- Pharyngeal- Mechanical Soft Reduced tongue base retraction;Pharyngeal  residue - valleculae Pharyngeal -- Pharyngeal- Regular -- Pharyngeal -- Pharyngeal- Multi-consistency -- Pharyngeal -- Pharyngeal- Pill Reduced tongue base retraction;Pharyngeal residue - valleculae Pharyngeal -- Pharyngeal Comment --  CHL IP CERVICAL ESOPHAGEAL PHASE 04/25/2020 Cervical Esophageal Phase Impaired Pudding Teaspoon -- Pudding Cup -- Honey Teaspoon -- Honey Cup -- Nectar Teaspoon -- Nectar Cup -- Nectar Straw -- Thin Teaspoon -- Thin Cup Reduced cricopharyngeal relaxation Thin Straw Reduced cricopharyngeal relaxation Puree Reduced cricopharyngeal relaxation Mechanical Soft Reduced cricopharyngeal relaxation Regular -- Multi-consistency -- Pill Reduced cricopharyngeal relaxation Cervical Esophageal Comment -- Mahala Menghini., M.A. CCC-SLP Acute Rehabilitation Services Pager [phone] Office [phone] 04/25/2020, 4:13 PM

## 2020-05-22 NOTE — Evaluation (Signed)
Clinical/Bedside Swallow Evaluation Patient Details  Name: Shannon [name] MRN: [id number] Date of Birth: [birth year]  Today's Date: 05/22/2020 Time: SLP Start Time (ACUTE ONLY): 1200 SLP Stop Time (ACUTE ONLY): 1215 SLP Time Calculation (min) (ACUTE ONLY): 15 min  Past Medical History:  Past Medical History:  Diagnosis Date  . Asthma   . 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 Newell Coral, 1992-->[facility].  Summer 2021->Cervicalgia,arm pain, hand atrophy RUE wkness, hyperreflex (Dr. 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 Hay fever   . Hypertension   . Osteoarthritis of both hands    Past Surgical History:  Past Surgical History:  Procedure Laterality Date  . ANTERIOR CERVICAL DECOMP/DISCECTOMY FUSION N/A 04/05/2020   Procedure: ANTERIOR CERVICAL DECOMPRESSION/DISCECTOMY FUSION, INTERBODY PROSTHESIS, PLATE/SCREWS CERVICAL THREE-CERVICAL FOUR, CERVICAL FOUR- CERVICAL FIVE;  Surgeon: Tressie Stalker, MD;  Location: [facility] OR;  Service: Neurosurgery;  Laterality: N/A;  . ANTERIOR CERVICAL DECOMP/DISCECTOMY FUSION N/A 04/22/2020   Procedure: Reexploration of anterior cervical wound for epidural hematoma;  Surgeon: Donalee Citrin, MD;  Location: [facility] OR;  Service: Neurosurgery;  Laterality: N/A;  . BACK SURGERY    . INCISION AND DRAINAGE Left 2012   L hand infection  . ROTATOR CUFF REPAIR Right    x 3  . SPINE SURGERY     HPI:  Shannon Guardia Smithis a 74 y.o.malewith medical history significant ofquadriplegia which follows surgery for Chiari I malformation with syringomyelia surgery in October 2021.  He originally had a C3-4 [place] 4-5 anterior cervical disc ACDF with decompression, C3 3-4 and C4-5 interbody arthrodesis with local autograft bone, anterior cervical plating of C3-C5 on 04/05/2020.  He then went home and had a fall from his bed, he was then  hospitalized and from here he was sent to a nursing home, he has been quadriplegic since then, he has also developed sacral decubitus ulcers. Was seen for MBS on 05/04/20 "deficits resulted in silent aspiration of thin liquid and nectar thick liquid by straw prior to the swallow. With nectar thick liquid by cup, only transient penetration was seen. There was significant pharyngeal residue with puree and solid consistencies which was reduced but not fully cleared with liquid wash." Now admitted for AMS due to UTI, sepsis and hypotension in a patient with indwelling Foley catheter POA and sacral decubitus ulcers POA who is quadriplegic.    Assessment / Plan / Recommendation Clinical Impression  Pt was seen for BSE. He was observed with ice chips, nectar thick liquid, and puree. Immediate coughing was observed with initial POs of ice chips and nectar. However, subsequent POs of nectar and puree were tolerated without any s/sx of aspiration. He demonstrated multiple swallows per PO, indicating the potential for pharyngeal residue. SLP spoke with pt's daugther and she reports that at baseline he consumes a full liquid (nectar thick) diet. At this time, recommend pt resume full nectar-thick liquid diet. He appears to have a chronic dysphagia and is at risk for inadequate nutrition, therefore, alternative means of nutrition may need to be considered. SLP will continue to f/u for diet toleration and advancement.  SLP Visit Diagnosis: Dysphagia, oropharyngeal phase (R13.12)    Aspiration Risk  Moderate aspiration risk;Risk for inadequate nutrition/hydration    Diet Recommendation Nectar-thick liquid   Liquid Administration via: Spoon;Cup Medication Administration: Crushed with puree Supervision: Staff to assist with self feeding;Full supervision/cueing for compensatory strategies  Postural Changes: Seated upright at 90 degrees    Other  Recommendations Oral Care Recommendations: Oral care BID   Follow up  Recommendations Skilled Nursing facility      Frequency and Duration min 2x/week  2 weeks       Prognosis Prognosis for Safe Diet Advancement: Fair Barriers to Reach Goals: Cognitive deficits      Swallow Study   General Date of Onset: 05/22/20 HPI: Shannon Herter Smithis a 74 y.o.malewith medical history significant ofquadriplegia which follows surgery for Chiari I malformation with syringomyelia surgery in October 2021.  He originally had a C3-4 [place] 4-5 anterior cervical disc ACDF with decompression, C3 3-4 and C4-5 interbody arthrodesis with local autograft bone, anterior cervical plating of C3-C5 on 04/05/2020.  He then went home and had a fall from his bed, he was then hospitalized and from here he was sent to a nursing home, he has been quadriplegic since then, he has also developed sacral decubitus ulcers. Was seen for MBS on 05/04/20 "deficits resulted in silent aspiration of thin liquid and nectar thick liquid by straw prior to the swallow. With nectar thick liquid by cup, only transient penetration was seen. There was significant pharyngeal residue with puree and solid consistencies which was reduced but not fully cleared with liquid wash." Now admitted for AMS due to UTI, sepsis and hypotension in a patient with indwelling Foley catheter POA and sacral decubitus ulcers POA who is quadriplegic.  Type of Study: Bedside Swallow Evaluation Previous Swallow Assessment: MBS 11/11 Diet Prior to this Study: Regular;Thin liquids Respiratory Status: Nasal cannula History of Recent Intubation: No Behavior/Cognition: Lethargic/Drowsy;Cooperative Oral Cavity Assessment: Dry Oral Care Completed by SLP: No Oral Cavity - Dentition: Adequate natural dentition Self-Feeding Abilities: Total assist Patient Positioning: Upright in bed Baseline Vocal Quality: Low vocal intensity    Oral/Motor/Sensory Function Overall Oral Motor/Sensory Function: Mild impairment Facial ROM: Within Functional  Limits Facial Symmetry: Within Functional Limits Facial Strength: Within Functional Limits Lingual ROM: Reduced right;Reduced left Lingual Symmetry: Within Functional Limits Lingual Strength: Reduced   Ice Chips Ice chips: Impaired Presentation: Spoon Pharyngeal Phase Impairments: Cough - Immediate   Thin Liquid Thin Liquid: Not tested    Nectar Thick Nectar Thick Liquid: Impaired Presentation: Straw Pharyngeal Phase Impairments: Cough - Immediate;Multiple swallows   Honey Thick Honey Thick Liquid: Not tested   Puree Puree: Impaired Pharyngeal Phase Impairments: Multiple swallows   Solid     Solid: Not tested      Royetta Crochet 05/22/2020,12:42 PM

## 2020-05-22 NOTE — ED Notes (Signed)
Patient agitated intermittently, yells out "please help me" .. "just kill me", pt noted to be in sinus tach in 130s at this time. Patient has briefly become tachycardic in 130s-140s with minimal stimulation (BP checks), attending provider already aware of same. This RN in room to assess. Patient nods head yes when asked if hurting. States "in the middle" when asked location. Medication to be administered for same.

## 2020-05-22 NOTE — Consult Note (Signed)
Reason for Consult: Sepsis Referring Physician: Dr. Arn Medal Shannon [name] is an 74 y.o. male.  HPI: The patient is a 74 year old black male with a complicated history.  He had a Chiari malformation with syringomyelia operated on at the [place] many years ago.  He has been chronically quadriparetic.  More recently he presented with a progressive myelopathy and worsening Quadraparesis.  I performed a C3-4 and C4-5 anterior cervicectomy, fusion and plating on him on 04/05/2020.  The patient initially was progressing well but took a fall suffering a cervical fracture and worsening Quadraparesis.  Dr. Wynetta Emery reoperated on his neck on 04/22/2020.  The patient was subsequently discharged to a skilled nursing facility.  He was readmitted with mental status changes and sepsis, likely from a UTI.  A neurosurgical consultation was requested.  Presently the patient is mildly somnolent but arousable.  He will answer simple questions.  He denies pain.  Past Medical History:  Diagnosis Date  . Asthma   . 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 Newell Coral, 1992-->surg at [facility].  Summer 2021->Cervicalgia,arm pain, hand atrophy RUE wkness, hyperreflex (Dr. 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 Hay fever   . Hypertension   . Osteoarthritis of both hands     Past Surgical History:  Procedure Laterality Date  . ANTERIOR CERVICAL DECOMP/DISCECTOMY FUSION N/A 04/05/2020   Procedure: ANTERIOR CERVICAL DECOMPRESSION/DISCECTOMY FUSION, INTERBODY PROSTHESIS, PLATE/SCREWS CERVICAL THREE-CERVICAL FOUR, CERVICAL FOUR- CERVICAL FIVE;  Surgeon: Tressie Stalker, MD;  Location: [facility] OR;  Service: Neurosurgery;  Laterality: N/A;  . ANTERIOR CERVICAL DECOMP/DISCECTOMY FUSION N/A 04/22/2020   Procedure: Reexploration of anterior cervical wound for epidural hematoma;  Surgeon: Donalee Citrin,  MD;  Location: [facility] OR;  Service: Neurosurgery;  Laterality: N/A;  . BACK SURGERY    . INCISION AND DRAINAGE Left 2012   L hand infection  . ROTATOR CUFF REPAIR Right    x 3  . SPINE SURGERY      Family History  Problem Relation Age of Onset  . Heart attack Mother   . Heart disease Mother   . High blood pressure Mother   . Alcohol abuse Father   . Cancer Father   . Diabetes Father   . High blood pressure Father     Social History:  reports that he has quit smoking. He has never used smokeless tobacco. He reports previous alcohol use. He reports that he does not use drugs.  Allergies:  Allergies  Allergen Reactions  . Iodine Swelling  . Penicillins Swelling    Facial swelling, itchy throat    Medications:  I have reviewed the patient's current medications. Prior to Admission:  Medications Prior to Admission  Medication Sig Dispense Refill Last Dose  . acetaminophen (TYLENOL) 500 MG tablet Take 1,000 mg by mouth in the morning, at noon, and at bedtime.     Marland Kitchen amLODipine (NORVASC) 10 MG tablet Take 10 mg by mouth daily.     Marland Kitchen ELIQUIS 2.5 MG TABS tablet Take 2.5 mg by mouth 2 (two) times daily.     . food thickener (SIMPLYTHICK) POWD Take 2 packets by mouth as needed. 1 packet 0   . ibuprofen (ADVIL) 600 MG tablet Take 1 tablet (600 mg total) by mouth every 6 (six) hours as needed for fever. 30 tablet 0   . pantoprazole (PROTONIX) 40 MG tablet Take 1 tablet (40  mg total) by mouth at bedtime.     . polyethylene glycol (MIRALAX / GLYCOLAX) 17 g packet Take 17 g by mouth daily.     . tamsulosin (FLOMAX) 0.4 MG CAPS capsule Take 1 capsule (0.4 mg total) by mouth daily. 30 capsule 1    Scheduled: . collagenase   Topical Daily  . heparin  5,000 Units Subcutaneous Q8H  . pantoprazole  40 mg Oral QHS  . tamsulosin  0.4 mg Oral Daily   Continuous: . ceFEPime (MAXIPIME) IV Stopped (05/22/20 0603)  . lactated ringers     WUJ:WJXBJYNWGPRN:ketorolac, [DISCONTINUED] ondansetron **OR** ondansetron  (ZOFRAN) IV Anti-infectives (From admission, onward)   Start     Dose/Rate Route Frequency Ordered Stop   05/22/20 0500  ceFEPIme (MAXIPIME) 2 g in sodium chloride 0.9 % 100 mL IVPB        2 g 200 mL/hr over 30 Minutes Intravenous Every 12 hours 05/21/20 1614     05/21/20 2015  aztreonam (AZACTAM) 2 g in sodium chloride 0.9 % 100 mL IVPB        2 g 200 mL/hr over 30 Minutes Intravenous  Once 05/21/20 2010 05/21/20 2230   05/21/20 1730  vancomycin (VANCOREADY) IVPB 1750 mg/350 mL        1,750 mg 175 mL/hr over 120 Minutes Intravenous  Once 05/21/20 1722 05/21/20 2148   05/21/20 1545  ceFEPIme (MAXIPIME) 2 g in sodium chloride 0.9 % 100 mL IVPB        2 g 200 mL/hr over 30 Minutes Intravenous  Once 05/21/20 1535 05/21/20 1704       Results for orders placed or performed during the hospital encounter of 05/21/20 (from the past 48 hour(s))  Lactic acid, plasma     Status: Abnormal   Collection Time: 05/21/20  3:33 PM  Result Value Ref Range   Lactic Acid, Venous 3.4 (HH) 0.5 - 1.9 mmol/L    Comment: CRITICAL RESULT CALLED TO, READ BACK BY AND VERIFIED WITH: RYAN HARDY [email or web address]  ON 11.28.21 BY TCALDWELL MT. Performed at [facility], [address] [place], [place] [address]   Comprehensive metabolic panel     Status: Abnormal   Collection Time: 05/21/20  3:33 PM  Result Value Ref Range   Sodium 140 135 - 145 mmol/L   Potassium 4.1 3.5 - 5.1 mmol/L   Chloride 102 98 - 111 mmol/L   CO2 28 22 - 32 mmol/L   Glucose, Bld 174 (H) 70 - 99 mg/dL    Comment: Glucose reference range applies only to samples taken after fasting for at least 8 hours.   BUN 25 (H) 8 - 23 mg/dL   Creatinine, Ser 3.082.06 (H) 0.61 - 1.24 mg/dL   Calcium 8.0 (L) 8.9 - 10.3 mg/dL   Total Protein 5.9 (L) 6.5 - 8.1 g/dL   Albumin 1.8 (L) 3.5 - 5.0 g/dL   AST 42 (H) 15 - 41 U/L   ALT 32 0 - 44 U/L   Alkaline Phosphatase 83 38 - 126 U/L   Total Bilirubin 0.4 0.3 - 1.2 mg/dL   GFR, Estimated 33 (L) >60 mL/min     Comment: (NOTE) Calculated using the CKD-EPI Creatinine Equation (2021)    Anion gap 10 5 - 15    Comment: Performed at [facility], [address] [place], [place] [address]  CBC WITH DIFFERENTIAL     Status: Abnormal   Collection Time: 05/21/20  3:33 PM  Result Value Ref Range  WBC 12.4 (H) 4.0 - 10.5 K/uL   RBC 3.28 (L) 4.22 - 5.81 MIL/uL   Hemoglobin 9.4 (L) 13.0 - 17.0 g/dL   HCT 58.8 (L) 39 - 52 %   MCV 94.2 80.0 - 100.0 fL   MCH 28.7 26.0 - 34.0 pg   MCHC 30.4 30.0 - 36.0 g/dL   RDW 50.2 (H) 77.4 - 12.8 %   Platelets 187 150 - 400 K/uL   nRBC 0.0 0.0 - 0.2 %   Neutrophils Relative % 80 %   Neutro Abs 9.9 (H) 1.7 - 7.7 K/uL   Lymphocytes Relative 9 %   Lymphs Abs 1.1 0.7 - 4.0 K/uL   Monocytes Relative 9 %   Monocytes Absolute 1.1 (H) 0.1 - 1.0 K/uL   Eosinophils Relative 0 %   Eosinophils Absolute 0.0 0.0 - 0.5 K/uL   Basophils Relative 0 %   Basophils Absolute 0.0 0.0 - 0.1 K/uL   Immature Granulocytes 2 %   Abs Immature Granulocytes 0.24 (H) 0.00 - 0.07 K/uL    Comment: Performed at [facility], [address], [place], [place] [address]  Protime-INR     Status: Abnormal   Collection Time: 05/21/20  3:33 PM  Result Value Ref Range   Prothrombin Time 17.7 (H) 11.4 - 15.2 seconds   INR 1.5 (H) 0.8 - 1.2    Comment: (NOTE) INR goal varies based on device and disease states. Performed at [facility], [address], [place], [place] [address]   APTT     Status: None   Collection Time: 05/21/20  3:33 PM  Result Value Ref Range   aPTT 31 24 - 36 seconds    Comment: Performed at [facility], [address], [place], [place] [address]  Type and screen [facility]     Status: None   Collection Time: 05/21/20  3:33 PM  Result Value Ref Range   ABO/RH(D) A POS    Antibody Screen NEG    Sample Expiration      05/24/2020,2359 Performed at [facility], [address] [place], [place] [address]   Resp Panel by  RT-PCR (Flu A&B, Covid) Nasopharyngeal Swab     Status: None   Collection Time: 05/21/20  3:35 PM   Specimen: Nasopharyngeal Swab; Nasopharyngeal(NP) swabs in vial transport medium  Result Value Ref Range   SARS Coronavirus 2 by RT PCR NEGATIVE NEGATIVE    Comment: (NOTE) SARS-CoV-2 target nucleic acids are NOT DETECTED.  The SARS-CoV-2 RNA is generally detectable in upper respiratory specimens during the acute phase of infection. The lowest concentration of SARS-CoV-2 viral copies this assay can detect is 138 copies/mL. A negative result does not preclude SARS-Cov-2 infection and should not be used as the sole basis for treatment or other patient management decisions. A negative result may occur with  improper specimen collection/handling, submission of specimen other than nasopharyngeal swab, presence of viral mutation(s) within the areas targeted by this assay, and inadequate number of viral copies(<138 copies/mL). A negative result must be combined with clinical observations, patient history, and epidemiological information. The expected result is Negative.  Fact Sheet for Patients:  BloggerCourse.com  Fact Sheet for Healthcare Providers:  SeriousBroker.it  This test is no t yet approved or cleared by the [place] FDA and  has been authorized for detection and/or diagnosis of SARS-CoV-2 by FDA under an Emergency Use Authorization (EUA). This EUA will remain  in effect (meaning  this test can be used) for the duration of the COVID-19 declaration under Section 564(b)(1) of the Act, 21 U.S.C.section 360bbb-3(b)(1), unless the authorization is terminated  or revoked sooner.       Influenza A by PCR NEGATIVE NEGATIVE   Influenza B by PCR NEGATIVE NEGATIVE    Comment: (NOTE) The Xpert Xpress SARS-CoV-2/FLU/RSV plus assay is intended as an aid in the diagnosis of influenza from Nasopharyngeal swab specimens and should not be  used as a sole basis for treatment. Nasal washings and aspirates are unacceptable for Xpert Xpress SARS-CoV-2/FLU/RSV testing.  Fact Sheet for Patients: BloggerCourse.com  Fact Sheet for Healthcare Providers: SeriousBroker.it  This test is not yet approved or cleared by the [place] FDA and has been authorized for detection and/or diagnosis of SARS-CoV-2 by FDA under an Emergency Use Authorization (EUA). This EUA will remain in effect (meaning this test can be used) for the duration of the COVID-19 declaration under Section 564(b)(1) of the Act, 21 U.S.C. section 360bbb-3(b)(1), unless the authorization is terminated or revoked.  Performed at [facility], [address] [place], [place] [address]   I-Stat Chem 8, ED     Status: Abnormal   Collection Time: 05/21/20  3:41 PM  Result Value Ref Range   Sodium 143 135 - 145 mmol/L   Potassium 4.0 3.5 - 5.1 mmol/L   Chloride 100 98 - 111 mmol/L   BUN 30 (H) 8 - 23 mg/dL   Creatinine, Ser 3.41 (H) 0.61 - 1.24 mg/dL   Glucose, Bld 937 (H) 70 - 99 mg/dL    Comment: Glucose reference range applies only to samples taken after fasting for at least 8 hours.   Calcium, Ion 1.09 (L) 1.15 - 1.40 mmol/L   TCO2 29 22 - 32 mmol/L   Hemoglobin 9.9 (L) 13.0 - 17.0 g/dL   HCT 90.2 (L) 39 - 52 %  I-Stat venous blood gas, ED     Status: Abnormal   Collection Time: 05/21/20  3:44 PM  Result Value Ref Range   pH, Ven 7.382 7.25 - 7.43   pCO2, Ven 52.9 44 - 60 mmHg   pO2, Ven 45.0 32 - 45 mmHg   Bicarbonate 31.4 (H) 20.0 - 28.0 mmol/L   TCO2 33 (H) 22 - 32 mmol/L   O2 Saturation 79.0 %   Acid-Base Excess 5.0 (H) 0.0 - 2.0 mmol/L   Sodium 142 135 - 145 mmol/L   Potassium 4.0 3.5 - 5.1 mmol/L   Calcium, Ion 1.12 (L) 1.15 - 1.40 mmol/L   HCT 30.0 (L) 39 - 52 %   Hemoglobin 10.2 (L) 13.0 - 17.0 g/dL   Sample type VENOUS   Ammonia     Status: None   Collection Time: 05/21/20  3:46 PM   Result Value Ref Range   Ammonia 21 9 - 35 umol/L    Comment: Performed at [facility], [address], [place], [place] [address]  Blood Culture (routine x 2)     Status: None (Preliminary result)   Collection Time: 05/21/20  4:40 PM   Specimen: BLOOD  Result Value Ref Range   Specimen Description BLOOD SITE NOT SPECIFIED    Special Requests      BOTTLES DRAWN AEROBIC ONLY Blood Culture results may not be optimal due to an inadequate volume of blood received in culture bottles Performed at [facility], [address], [place], [place] [address]    Culture PENDING  Report Status PENDING   Lactic acid, plasma     Status: Abnormal   Collection Time: 05/21/20  4:40 PM  Result Value Ref Range   Lactic Acid, Venous 2.7 (HH) 0.5 - 1.9 mmol/L    Comment: CRITICAL VALUE NOTED.  VALUE IS CONSISTENT WITH PREVIOUSLY REPORTED AND CALLED VALUE. Performed at [facility], [address], [place], [place] [address]   TSH     Status: None   Collection Time: 05/21/20  4:40 PM  Result Value Ref Range   TSH 1.839 0.350 - 4.500 uIU/mL    Comment: Performed by a 3rd Generation assay with a functional sensitivity of <=0.01 uIU/mL. Performed at [facility], [address] [place], [place] [address]   T4, free     Status: None   Collection Time: 05/21/20  4:40 PM  Result Value Ref Range   Free T4 1.10 0.61 - 1.12 ng/dL    Comment: (NOTE) Biotin ingestion may interfere with free T4 tests. If the results are inconsistent with the TSH level, previous test results, or the clinical presentation, then consider biotin interference. If needed, order repeat testing after stopping biotin. Performed at [facility], [address] [place], [place] [address]   Blood Culture (routine x 2)     Status: None (Preliminary result)   Collection Time: 05/21/20  4:42 PM   Specimen: BLOOD  Result Value Ref Range   Specimen Description BLOOD SITE NOT SPECIFIED    Special Requests       BOTTLES DRAWN AEROBIC ONLY Blood Culture results may not be optimal due to an inadequate volume of blood received in culture bottles Performed at [facility], [address], [place], [place] [address]    Culture PENDING    Report Status PENDING   Urinalysis, Routine w reflex microscopic Urine, Catheterized     Status: Abnormal   Collection Time: 05/21/20  5:22 PM  Result Value Ref Range   Color, Urine RED (A) YELLOW    Comment: BIOCHEMICALS MAY BE AFFECTED BY COLOR   APPearance CLOUDY (A) CLEAR   Specific Gravity, Urine 1.020 1.005 - 1.030   pH 7.0 5.0 - 8.0   Glucose, UA NEGATIVE NEGATIVE mg/dL   Hgb urine dipstick LARGE (A) NEGATIVE   Bilirubin Urine NEGATIVE NEGATIVE   Ketones, ur 15 (A) NEGATIVE mg/dL   Protein, ur >295 (A) NEGATIVE mg/dL   Nitrite POSITIVE (A) NEGATIVE   Leukocytes,Ua MODERATE (A) NEGATIVE    Comment: Performed at [facility], [address] [place], [place] [address]  Urinalysis, Microscopic (reflex)     Status: Abnormal   Collection Time: 05/21/20  5:22 PM  Result Value Ref Range   RBC / HPF >50 0 - 5 RBC/hpf   WBC, UA >50 0 - 5 WBC/hpf   Bacteria, UA MANY (A) NONE SEEN   Squamous Epithelial / LPF NONE SEEN 0 - 5   Mucus PRESENT     Comment: Performed at [facility], [address], [place], [place] [address]  Lactic acid, plasma     Status: Abnormal   Collection Time: 05/21/20  8:30 PM  Result Value Ref Range   Lactic Acid, Venous 2.2 (HH) 0.5 - 1.9 mmol/L    Comment: CRITICAL VALUE NOTED.  VALUE IS CONSISTENT WITH PREVIOUSLY REPORTED AND CALLED VALUE. Performed at [facility], [address], [place], [place] [address]   Protime-INR     Status: Abnormal  Collection Time: 05/22/20  5:07 AM  Result Value Ref Range   Prothrombin Time 17.9 (H) 11.4 - 15.2 seconds   INR 1.5 (H) 0.8 - 1.2    Comment: (NOTE) INR goal varies based on device and disease states. Performed at [facility], [address]  [place], [place] [address]   Cortisol-am, blood     Status: Abnormal   Collection Time: 05/22/20  5:07 AM  Result Value Ref Range   Cortisol - AM 30.0 (H) 6.7 - 22.6 ug/dL    Comment: Performed at [facility], [address], [place], [place] [address]  Procalcitonin     Status: None   Collection Time: 05/22/20  5:07 AM  Result Value Ref Range   Procalcitonin 0.12 ng/mL    Comment:        Interpretation: PCT (Procalcitonin) <= 0.5 ng/mL: Systemic infection (sepsis) is not likely. Local bacterial infection is possible. (NOTE)       Sepsis PCT Algorithm           Lower Respiratory Tract                                      Infection PCT Algorithm    ----------------------------     ----------------------------         PCT < 0.25 ng/mL                PCT < 0.10 ng/mL          Strongly encourage             Strongly discourage   discontinuation of antibiotics    initiation of antibiotics    ----------------------------     -----------------------------       PCT 0.25 - 0.50 ng/mL            PCT 0.10 - 0.25 ng/mL               OR       >80% decrease in PCT            Discourage initiation of                                            antibiotics      Encourage discontinuation           of antibiotics    ----------------------------     -----------------------------         PCT >= 0.50 ng/mL              PCT 0.26 - 0.50 ng/mL               AND        <80% decrease in PCT             Encourage initiation of                                             antibiotics       Encourage continuation           of antibiotics    ----------------------------     -----------------------------  PCT >= 0.50 ng/mL                  PCT > 0.50 ng/mL               AND         increase in PCT                  Strongly encourage                                      initiation of antibiotics    Strongly encourage escalation           of antibiotics                                      -----------------------------                                           PCT <= 0.25 ng/mL                                                 OR                                        > 80% decrease in PCT                                      Discontinue / Do not initiate                                             antibiotics  Performed at [facility], [address] [place], [place] [address]   CBC     Status: Abnormal   Collection Time: 05/22/20  5:07 AM  Result Value Ref Range   WBC 14.1 (H) 4.0 - 10.5 K/uL   RBC 2.77 (L) 4.22 - 5.81 MIL/uL   Hemoglobin 8.1 (L) 13.0 - 17.0 g/dL   HCT 60.4 (L) 39 - 52 %   MCV 93.5 80.0 - 100.0 fL   MCH 29.2 26.0 - 34.0 pg   MCHC 31.3 30.0 - 36.0 g/dL   RDW 54.0 (H) 98.1 - 19.1 %   Platelets 179 150 - 400 K/uL   nRBC 0.0 0.0 - 0.2 %    Comment: Performed at [facility], [address], [place], [place] [address]  Comprehensive metabolic panel     Status: Abnormal   Collection Time: 05/22/20  5:07 AM  Result Value Ref Range   Sodium 137 135 - 145 mmol/L   Potassium 3.7 3.5 - 5.1 mmol/L   Chloride 103 98 - 111 mmol/L   CO2 25 22 - 32 mmol/L   Glucose, Bld 124 (H) 70 - 99 mg/dL    Comment: Glucose  reference range applies only to samples taken after fasting for at least 8 hours.   BUN 24 (H) 8 - 23 mg/dL   Creatinine, Ser 1.61 (H) 0.61 - 1.24 mg/dL   Calcium 7.9 (L) 8.9 - 10.3 mg/dL   Total Protein 5.1 (L) 6.5 - 8.1 g/dL   Albumin 1.8 (L) 3.5 - 5.0 g/dL   AST 34 15 - 41 U/L   ALT 27 0 - 44 U/L   Alkaline Phosphatase 71 38 - 126 U/L   Total Bilirubin 0.6 0.3 - 1.2 mg/dL   GFR, Estimated 49 (L) >60 mL/min    Comment: (NOTE) Calculated using the CKD-EPI Creatinine Equation (2021)    Anion gap 9 5 - 15    Comment: Performed at [facility], [address], [place], [place] [address]  C-reactive protein     Status: None   Collection Time: 05/22/20  5:07 AM  Result Value Ref Range   CRP 0.7 <1.0 mg/dL    Comment:  Performed at [facility], [address] [place], [place] [address]  Hemoglobin and hematocrit, blood     Status: Abnormal   Collection Time: 05/22/20  8:16 AM  Result Value Ref Range   Hemoglobin 7.7 (L) 13.0 - 17.0 g/dL   HCT 11.9 (L) 39 - 52 %    Comment: Performed at [facility], [address], [place], [place] [address]    CT Head Wo Contrast  Result Date: 05/21/2020 CLINICAL DATA:  Mental status change. EXAM: CT HEAD WITHOUT CONTRAST TECHNIQUE: Contiguous axial images were obtained from the base of the skull through the vertex without intravenous contrast. COMPARISON:  May 03, 2020 FINDINGS: Brain: No evidence of acute infarction, hemorrhage, hydrocephalus, extra-axial collection or mass lesion/mass effect. Mild brain parenchymal volume loss and deep white matter microangiopathy. Vascular: No hyperdense vessel or unexpected calcification. Skull: Normal. Negative for fracture or focal lesion. Sinuses/Orbits: No acute finding. Other: None. IMPRESSION: 1. No acute intracranial abnormality. 2. 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 Electronically Signed   By: Ted Mcalpine M.D.   On: 05/21/2020 17:53   DG Chest Port 1 View  Result Date: 05/21/2020 CLINICAL DATA:  Found unresponsive. EXAM: PORTABLE CHEST 1 VIEW COMPARISON:  May 08, 2020 FINDINGS: There is stable elevation of the right hemidiaphragm. Mild bilateral infrahilar atelectasis is seen. There is no evidence of a pleural effusion or pneumothorax. The heart size and mediastinal contours are within normal limits. The visualized skeletal structures are unremarkable. IMPRESSION: Mild bilateral infrahilar atelectasis. Electronically Signed   By: Aram Candela M.D.   On: 05/21/2020 16:34    ROS Blood pressure 94/60, pulse 81, temperature 98.2 F (36.8 C), resp. rate 12, height 6' (1.829 m), weight 120.7 kg, SpO2 96 %. Estimated body mass index is 36.1 kg/m as calculated from  the following:   Height as of this encounter: 6' (1.829 m).   Weight as of this encounter: 120.7 kg.  Physical Exam  General: A somnolent but arousable frail-appearing 74 year old black male in a cervical collar.  Neck: The patient's left anterior cervical incision is well-healed.  There is no hematoma, shift or signs of infection.  Neurologic exam: The patient is alert and oriented to person.  Is densely quadriparetic but moves all four extremities.  I have reviewed the patient's head CT performed today.  It demonstrates some atrophy but no acute findings.  On the scout images his upper cervical hardware appears well-positioned.  Assessment/Plan:  Sepsis: There were no signs of infection from his anterior cervical surgeries.  The scout images on his CAT scan look good.  I have spoken with Dr. Thedore Mins.  Please let me know if I can be of further assistance.  Shannon [name] 05/22/2020, 11:13 AM

## 2020-05-22 NOTE — Progress Notes (Signed)
PT Cancellation Note  Patient Details Name: Shannon [name] MRN: [id number] DOB: July 25, 1945   Cancelled Treatment:    Reason Eval/Treat Not Completed: Other (comment) (Daughter present and wanting time with pt. RN requests deferral until after GOC discussion today with MD)   [name] [name] 05/22/2020, 12:48 PM  Merryl Hacker, PT Acute Rehabilitation Services Pager: [phone] Office: [phone]

## 2020-05-22 NOTE — ED Provider Notes (Signed)
.  Critical Care Performed by: Pollyann Savoy, MD Authorized by: Pollyann Savoy, MD   Critical care provider statement:    Critical care time (minutes):  45   Critical care time was exclusive of:  Separately billable procedures and treating other patients   Critical care was necessary to treat or prevent imminent or life-threatening deterioration of the following conditions:  Sepsis and shock   Critical care was time spent personally by me on the following activities:  Discussions with consultants, evaluation of patient's response to treatment, examination of patient, ordering and performing treatments and interventions, ordering and review of laboratory studies, ordering and review of radiographic studies, pulse oximetry, re-evaluation of patient's condition, obtaining history from patient or surrogate and review of old charts      Pollyann Savoy, MD 05/22/20 1059

## 2020-05-22 NOTE — ED Notes (Signed)
Patient care assumed by this RN @0715  Patient is oriented to person and place, but thinks he's 70 and that the president is 89. Patient with firm belly and only 100 ml of gross bloody urine output from foley. MD at bedside at 0725 and RN to bedside to show him urine output as well as assessment of abd. Patient with tachycardia as well, see VS flow sheet. Per MD, deflate foley balloon and advance into bladder. Flush with sterile water with pump pull technique with 100-200 ml until foley draining. This was performed with Montez Morita, EMT at bedside for assist. Patient tolerated well. Patient has large clots that are clogging tubing. After using 200 mml sterile water, we have had 800 ml of output, but grossly clotted. Sent repeat H&H and updated provider on status. Post procedure, bladder scan performed for <236 ml. Patient cleaned and placed on new chux and linens/gown. Soft brown stool x 1 cleaned as well at this time. Will continue to monitor. MD and Charge RN aware.

## 2020-05-22 NOTE — Consult Note (Addendum)
WOC Nurse Consult Note: Reason for Consult: Consult requested for sacrum wound.  Performed remotely after review of photos and progress notes in the EMR.  Wound type: Unstageable pressure injury to sacrum, present on admission.  Large amt eschar to inner wound. Approx 80% black, 20% red, mod amt tan drainage.  Dressing procedure/placement/frequency: Secure chat message sent to primary team: recommend surgical consult for possible debridement of nonviable tissue.  Topical treatment orders provided for bedside nurses to perform as follows to assist with enzymatic debridement of nonviable tissue as follows: Apply Santyl to sacrum wound Q day, then cover with moist gauze packing and ABD pad and tape. Air mattress to reduce pressure.  Please re-consult if further assistance is needed.  Thank-you,  Cammie Mcgee MSN, RN, CWOCN, [name], CNS [phone]

## 2020-05-23 DIAGNOSIS — A419 Sepsis, unspecified organism: Secondary | ICD-10-CM | POA: Diagnosis not present

## 2020-05-23 DIAGNOSIS — N390 Urinary tract infection, site not specified: Secondary | ICD-10-CM | POA: Diagnosis not present

## 2020-05-23 LAB — COMPREHENSIVE METABOLIC PANEL
ALT: 35 U/L (ref 0–44)
AST: 46 U/L — ABNORMAL HIGH (ref 15–41)
Albumin: 1.6 g/dL — ABNORMAL LOW (ref 3.5–5.0)
Alkaline Phosphatase: 67 U/L (ref 38–126)
Anion gap: 11 (ref 5–15)
BUN: 23 mg/dL (ref 8–23)
CO2: 26 mmol/L (ref 22–32)
Calcium: 8.1 mg/dL — ABNORMAL LOW (ref 8.9–10.3)
Chloride: 104 mmol/L (ref 98–111)
Creatinine, Ser: 1.08 mg/dL (ref 0.61–1.24)
GFR, Estimated: >60 mL/min (ref >60)
Glucose, Bld: 84 mg/dL (ref 70–99)
Potassium: 3.3 mmol/L — ABNORMAL LOW (ref 3.5–5.1)
Sodium: 141 mmol/L (ref 135–145)
Total Bilirubin: 0.8 mg/dL (ref 0.3–1.2)
Total Protein: 4.9 g/dL — ABNORMAL LOW (ref 6.5–8.1)

## 2020-05-23 LAB — CBC WITH DIFFERENTIAL/PLATELET
Abs Immature Granulocytes: 0.16 10*3/uL — ABNORMAL HIGH (ref 0.00–0.07)
Basophils Absolute: 0 10*3/uL (ref 0.0–0.1)
Basophils Relative: 1 %
Eosinophils Absolute: 0.1 10*3/uL (ref 0.0–0.5)
Eosinophils Relative: 1 %
HCT: 23.9 % — ABNORMAL LOW (ref 39.0–52.0)
Hemoglobin: 7.4 g/dL — ABNORMAL LOW (ref 13.0–17.0)
Immature Granulocytes: 2 %
Lymphocytes Relative: 17 %
Lymphs Abs: 1.4 10*3/uL (ref 0.7–4.0)
MCH: 29.1 pg (ref 26.0–34.0)
MCHC: 31 g/dL (ref 30.0–36.0)
MCV: 94.1 fL (ref 80.0–100.0)
Monocytes Absolute: 1 10*3/uL (ref 0.1–1.0)
Monocytes Relative: 12 %
Neutro Abs: 5.4 10*3/uL (ref 1.7–7.7)
Neutrophils Relative %: 67 %
Platelets: 150 10*3/uL (ref 150–400)
RBC: 2.54 MIL/uL — ABNORMAL LOW (ref 4.22–5.81)
RDW: 15.9 % — ABNORMAL HIGH (ref 11.5–15.5)
WBC: 8.1 10*3/uL (ref 4.0–10.5)
nRBC: 0 % (ref 0.0–0.2)

## 2020-05-23 LAB — CBC
HCT: 25.6 % — ABNORMAL LOW (ref 39.0–52.0)
Hemoglobin: 8.1 g/dL — ABNORMAL LOW (ref 13.0–17.0)
MCH: 29.6 pg (ref 26.0–34.0)
MCHC: 31.6 g/dL (ref 30.0–36.0)
MCV: 93.4 fL (ref 80.0–100.0)
Platelets: 142 10*3/uL — ABNORMAL LOW (ref 150–400)
RBC: 2.74 MIL/uL — ABNORMAL LOW (ref 4.22–5.81)
RDW: 15.6 % — ABNORMAL HIGH (ref 11.5–15.5)
WBC: 10.1 10*3/uL (ref 4.0–10.5)
nRBC: 0 % (ref 0.0–0.2)

## 2020-05-23 LAB — BRAIN NATRIURETIC PEPTIDE: B Natriuretic Peptide: 35.1 pg/mL (ref 0.0–100.0)

## 2020-05-23 LAB — PROCALCITONIN: Procalcitonin: 0.11 ng/mL

## 2020-05-23 LAB — C-REACTIVE PROTEIN: CRP: 0.7 mg/dL (ref <1.0)

## 2020-05-23 LAB — PREPARE RBC (CROSSMATCH)

## 2020-05-23 LAB — MAGNESIUM: Magnesium: 1.9 mg/dL (ref 1.7–2.4)

## 2020-05-23 MED ORDER — FERROUS SULFATE 325 (65 FE) MG PO TABS
325.0000 mg | ORAL_TABLET | Freq: Two times a day (BID) | ORAL | Status: DC
  Administered 2020-05-23 – 2020-06-12 (×40): 325 mg via ORAL
  Filled 2020-05-23 (×39): qty 1

## 2020-05-23 MED ORDER — POTASSIUM CHLORIDE 20 MEQ PO PACK
40.0000 meq | PACK | Freq: Once | ORAL | Status: AC
  Administered 2020-05-23: 40 meq via ORAL
  Filled 2020-05-23: qty 2

## 2020-05-23 MED ORDER — DOCUSATE SODIUM 100 MG PO CAPS
100.0000 mg | ORAL_CAPSULE | Freq: Two times a day (BID) | ORAL | Status: DC
  Administered 2020-05-23 – 2020-06-12 (×39): 100 mg via ORAL
  Filled 2020-05-23 (×40): qty 1

## 2020-05-23 MED ORDER — SODIUM CHLORIDE 0.9% IV SOLUTION: Freq: Once | INTRAVENOUS | Status: AC

## 2020-05-23 MED ORDER — MORPHINE SULFATE (PF) 2 MG/ML IV SOLN
2.0000 mg | INTRAVENOUS | Status: DC | PRN
  Administered 2020-05-23 – 2020-05-30 (×10): 2 mg via INTRAVENOUS
  Filled 2020-05-23 (×10): qty 1

## 2020-05-23 MED ORDER — SODIUM CHLORIDE 0.9 % IR SOLN
3000.0000 mL | Status: DC
  Administered 2020-05-24 – 2020-05-30 (×8): 3000 mL

## 2020-05-23 MED ORDER — SODIUM CHLORIDE 0.9 % IV SOLN
2.0000 g | Freq: Three times a day (TID) | INTRAVENOUS | Status: DC
  Administered 2020-05-23 – 2020-05-24 (×3): 2 g via INTRAVENOUS
  Filled 2020-05-23 (×5): qty 2

## 2020-05-23 MED ORDER — MORPHINE SULFATE (PF) 2 MG/ML IV SOLN
INTRAVENOUS | Status: AC
  Administered 2020-05-23: 2 mg via INTRAVENOUS
  Filled 2020-05-23: qty 1

## 2020-05-23 MED ORDER — DIPHENHYDRAMINE HCL 50 MG/ML IJ SOLN
25.0000 mg | Freq: Four times a day (QID) | INTRAMUSCULAR | Status: DC | PRN
  Administered 2020-05-23: 25 mg via INTRAVENOUS
  Filled 2020-05-23 (×2): qty 1

## 2020-05-23 MED ORDER — CYANOCOBALAMIN 1000 MCG/ML IJ SOLN
1000.0000 ug | Freq: Every day | INTRAMUSCULAR | Status: AC
  Administered 2020-05-23 – 2020-05-29 (×7): 1000 ug via INTRAMUSCULAR
  Filled 2020-05-23 (×7): qty 1

## 2020-05-23 NOTE — Consult Note (Signed)
I have been asked to see the patient by Dr. Susa Raring, for evaluation and management of gross hematuria.  History of present illness: 74 year old man who is a recent quadriplegic was admitted to the hospital with a UTI and concern for severe infection possible sepsis.  In the emergency department a Foley catheter was exchanged.  At that time there was concern for the balloon being inflated into the patient's prostate which caused significant hematuria.  Throughout the day the patient has had worsening blood clots through the catheter.  Urology was consulted for further input and help in this regard.   Review of systems: A 12 point comprehensive review of systems was obtained and is negative unless otherwise stated in the history of present illness.  Patient Active Problem List   Diagnosis Date Noted  . Respiration abnormal   . Respiratory distress   . Protein-calorie malnutrition, severe 05/05/2020  . Pressure injury of skin 05/04/2020  . Pneumonia 05/03/2020  . Sepsis secondary to UTI (HCC) 05/03/2020  . Urinary retention 05/03/2020  . Chronic indwelling Foley catheter 05/03/2020  . 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 Myelopathy (HCC) 04/22/2020  . Quadriplegia and quadriparesis (HCC)   . Acute blood loss anemia   . Slow transit constipation   . Essential hypertension   . Cervical myelopathy (HCC) 04/07/2020  . Quadriparesis (HCC)   . Benign essential HTN   . Post-operative pain   . Neuropathic pain   . Asthma   . 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    No current facility-administered medications on file prior to encounter.   Current Outpatient Medications on File Prior to Encounter  Medication Sig Dispense Refill  . acetaminophen (TYLENOL) 500 MG tablet Take 1,000 mg by mouth 3 (three) times daily.     Marland Kitchen albuterol (VENTOLIN HFA) 108 (90 Base) MCG/ACT inhaler Inhale 2 puffs into the lungs every morning.    Marland Kitchen ascorbic acid  (VITAMIN C) 500 MG tablet Take 1,000 mg by mouth every morning.    Marland Kitchen aspirin 325 MG tablet Take 325 mg by mouth every morning.    . food thickener (SIMPLYTHICK) POWD Take 2 packets by mouth as needed. (Patient taking differently: Take 2 packets by mouth as needed (to thicken liquids). ) 1 packet 0  . guaiFENesin (MUCINEX) 600 MG 12 hr tablet Take 600 mg by mouth every morning.    . pantoprazole (PROTONIX) 40 MG tablet Take 1 tablet (40 mg total) by mouth at bedtime.    . polyethylene glycol (MIRALAX / GLYCOLAX) 17 g packet Take 17 g by mouth every morning. Mix in 6 oz water and drink    . tamsulosin (FLOMAX) 0.4 MG CAPS capsule Take 1 capsule (0.4 mg total) by mouth daily. 30 capsule 1  . ibuprofen (ADVIL) 600 MG tablet Take 1 tablet (600 mg total) by mouth every 6 (six) hours as needed for fever. (Patient not taking: Reported on 05/22/2020) 30 tablet 0    Past Medical History:  Diagnosis Date  . Asthma   . 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 Newell Coral, 1992-->surg at [facility]  Summer 2021->Cervicalgia,arm pain, hand atrophy RUE wkness, hyperreflex (Dr. 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 Hay fever   . Hypertension   . Osteoarthritis of both hands     Past Surgical History:  Procedure Laterality Date  . ANTERIOR  CERVICAL DECOMP/DISCECTOMY FUSION N/A 04/05/2020   Procedure: ANTERIOR CERVICAL DECOMPRESSION/DISCECTOMY FUSION, INTERBODY PROSTHESIS, PLATE/SCREWS CERVICAL THREE-CERVICAL FOUR, CERVICAL FOUR- CERVICAL FIVE;  Surgeon: Tressie Stalker, MD;  Location: [facility] OR;  Service: Neurosurgery;  Laterality: N/A;  . ANTERIOR CERVICAL DECOMP/DISCECTOMY FUSION N/A 04/22/2020   Procedure: Reexploration of anterior cervical wound for epidural hematoma;  Surgeon: Donalee Citrin, MD;  Location: [facility] OR;  Service: Neurosurgery;  Laterality: N/A;  . BACK SURGERY     . INCISION AND DRAINAGE Left 2012   L hand infection  . ROTATOR CUFF REPAIR Right    x 3  . SPINE SURGERY      Social History   Tobacco Use  . Smoking status: Former Games developer  . Smokeless tobacco: Never Used  Vaping Use  . Vaping Use: Never used  Substance Use Topics  . Alcohol use: Not Currently  . Drug use: Never    Family History  Problem Relation Age of Onset  . Heart attack Mother   . Heart disease Mother   . High blood pressure Mother   . Alcohol abuse Father   . Cancer Father   . Diabetes Father   . High blood pressure Father     PE: Vitals:   05/23/20 0513 05/23/20 1443 05/23/20 1730 05/23/20 1800  BP: 108/63 101/61 94/61 105/73  Pulse: 60 75 80 66  Resp: 18 18 16 18   Temp: 98.3 F (36.8 C) 98.9 F (37.2 C) 98.3 F (36.8 C) 97.9 F (36.6 C)  TempSrc: Axillary Oral Oral Oral  SpO2: 96% 96% 93% 98%  Weight:      Height:       Patient appears to be in no acute distress  patient is alert and oriented x3 Atraumatic normocephalic head No cervical or supraclavicular lymphadenopathy appreciated No increased work of breathing, no audible wheezes/rhonchi Regular sinus rhythm/rate Abdomen is soft, nontender, nondistended, no CVA or suprapubic tenderness Noncircumcised penis with a Foley catheter in place.  Clot from around the catheter noted.  The urine is grossly bloody. Lower extremities are symmetric without appreciable edema No identifiable skin lesions  Recent Labs    05/21/20 1533 05/21/20 1541 05/22/20 0507 05/22/20 0816 05/23/20 0432  WBC 12.4*  --  14.1*  --  8.1  HGB 9.4*   < > 8.1* 7.7* 7.4*  HCT 30.9*   < > 25.9* 25.1* 23.9*   < > = values in this interval not displayed.   Recent Labs    05/21/20 1533 05/21/20 1533 05/21/20 1541 05/21/20 1541 05/21/20 1544 05/22/20 0507 05/23/20 0432  NA 140   < > 143   < > 142 137 141  K 4.1   < > 4.0   < > 4.0 3.7 3.3*  CL 102   < > 100  --   --  103 104  CO2 28  --   --   --   --  25 26   GLUCOSE 174*   < > 171*  --   --  124* 84  BUN 25*   < > 30*  --   --  24* 23  CREATININE 2.06*   < > 2.00*  --   --  1.50* 1.08  CALCIUM 8.0*  --   --   --   --  7.9* 8.1*   < > = values in this interval not displayed.   Recent Labs    05/21/20 1533 05/22/20 0507  INR 1.5* 1.5*   No results for input(s): LABURIN  in the last 72 hours. Results for orders placed or performed during the hospital encounter of 05/21/20  Urine culture     Status: None   Collection Time: 05/21/20  3:33 PM   Specimen: In/Out Cath Urine  Result Value Ref Range Status   Specimen Description IN/OUT CATH URINE  Final   Special Requests NONE  Final   Culture   Final    NO GROWTH Performed at [facility], [address] [place], [place] [address]    Report Status 05/22/2020 FINAL  Final  Resp Panel by RT-PCR (Flu A&B, Covid) Nasopharyngeal Swab     Status: None   Collection Time: 05/21/20  3:35 PM   Specimen: Nasopharyngeal Swab; Nasopharyngeal(NP) swabs in vial transport medium  Result Value Ref Range Status   SARS Coronavirus 2 by RT PCR NEGATIVE NEGATIVE Final    Comment: (NOTE) SARS-CoV-2 target nucleic acids are NOT DETECTED.  The SARS-CoV-2 RNA is generally detectable in upper respiratory specimens during the acute phase of infection. The lowest concentration of SARS-CoV-2 viral copies this assay can detect is 138 copies/mL. A negative result does not preclude SARS-Cov-2 infection and should not be used as the sole basis for treatment or other patient management decisions. A negative result may occur with  improper specimen collection/handling, submission of specimen other than nasopharyngeal swab, presence of viral mutation(s) within the areas targeted by this assay, and inadequate number of viral copies(<138 copies/mL). A negative result must be combined with clinical observations, patient history, and epidemiological information. The expected result is Negative.  Fact Sheet for  Patients:  BloggerCourse.comhttps://www.fda.gov/media/152166/download  Fact Sheet for Healthcare Providers:  SeriousBroker.ithttps://www.fda.gov/media/152162/download  This test is no t yet approved or cleared by the [place] FDA and  has been authorized for detection and/or diagnosis of SARS-CoV-2 by FDA under an Emergency Use Authorization (EUA). This EUA will remain  in effect (meaning this test can be used) for the duration of the COVID-19 declaration under Section 564(b)(1) of the Act, 21 U.S.C.section 360bbb-3(b)(1), unless the authorization is terminated  or revoked sooner.       Influenza A by PCR NEGATIVE NEGATIVE Final   Influenza B by PCR NEGATIVE NEGATIVE Final    Comment: (NOTE) The Xpert Xpress SARS-CoV-2/FLU/RSV plus assay is intended as an aid in the diagnosis of influenza from Nasopharyngeal swab specimens and should not be used as a sole basis for treatment. Nasal washings and aspirates are unacceptable for Xpert Xpress SARS-CoV-2/FLU/RSV testing.  Fact Sheet for Patients: BloggerCourse.comhttps://www.fda.gov/media/152166/download  Fact Sheet for Healthcare Providers: SeriousBroker.ithttps://www.fda.gov/media/152162/download  This test is not yet approved or cleared by the [place] FDA and has been authorized for detection and/or diagnosis of SARS-CoV-2 by FDA under an Emergency Use Authorization (EUA). This EUA will remain in effect (meaning this test can be used) for the duration of the COVID-19 declaration under Section 564(b)(1) of the Act, 21 U.S.C. section 360bbb-3(b)(1), unless the authorization is terminated or revoked.  Performed at [facility], [address] [place], [place] [address]   Blood Culture (routine x 2)     Status: None (Preliminary result)   Collection Time: 05/21/20  4:40 PM   Specimen: BLOOD  Result Value Ref Range Status   Specimen Description BLOOD SITE NOT SPECIFIED  Final   Special Requests   Final    BOTTLES DRAWN AEROBIC ONLY Blood Culture results may not be optimal  due to an inadequate volume of blood received in culture bottles   Culture  Final    NO GROWTH 2 DAYS Performed at [facility], [address], [place], [place] [address]    Report Status PENDING  Incomplete  Blood Culture (routine x 2)     Status: None (Preliminary result)   Collection Time: 05/21/20  4:42 PM   Specimen: BLOOD  Result Value Ref Range Status   Specimen Description BLOOD SITE NOT SPECIFIED  Final   Special Requests   Final    BOTTLES DRAWN AEROBIC ONLY Blood Culture results may not be optimal due to an inadequate volume of blood received in culture bottles   Culture   Final    NO GROWTH 2 DAYS Performed at [facility], [address], [place], [place] [address]    Report Status PENDING  Incomplete    Imaging: none  Imp: The patient has clot retention, probably from traumatic Foley insertion worsened by his infection.  Recommendations: With the help of the nursing staff we irrigated the patient's Foley catheter and removed all the clot.  The urine was pink lemonade when we were done.  We left it to straight drainage.  If the bleeding does not stop now that the clots have been emptied and the catheter is in the appropriate position and he starts to develop worsening clots again the patient will need a larger Foley catheter and be placed on continuous bladder irrigation.    Crist Fat

## 2020-05-23 NOTE — Progress Notes (Signed)
OT Cancellation Note  Patient Details Name: Shannon [name] MRN: [id number] DOB: [birth year]   Cancelled Treatment:    Reason Eval/Treat Not Completed: Other (comment)- spoke to RN, awaiting updated goals of care.  RN reports patient wishes to be comfortable at this time.  Will see as appropriate based on updated goals of care, but will hold OT evaluation at this time.   Barry Brunner, OT Acute Rehabilitation Services Pager [phone] Office [phone]   Chancy Milroy 05/23/2020, 1:38 PM

## 2020-05-23 NOTE — Progress Notes (Signed)
Irrigated foley and removed several large clots

## 2020-05-23 NOTE — Progress Notes (Addendum)
PROGRESS NOTE                                                                                                                                                                                                             Patient Demographics:    Shannon [name], is a 74 y.o. male, DOB - 05/16/1946, ONG:[id number]  Outpatient Primary MD for the patient is McGowen, Maryjean Morn, MD    LOS - 2  Admit date - 05/21/2020    Chief Complaint  Patient presents with  . Unresponsive       Brief Narrative (HPI from H&P) - Shannon [name] is a 74 y.o. 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  Patient had anterior approach for the repair.  He then went home and had a fall from his bed, he was then hospitalized and from here he was sent to a nursing home, he has been quadriplegic since then, he has also developed sacral decubitus ulcers.   In the ER this admission he was found to have UTI, dehydration with found hypotension.  A Foley catheter was placed in the ER however Foley did not yield any urine output but frank blood.  I think the Foley balloon was inflated in the urethra.  Balloon was deflated, Foley was flushed with 800 cc of urine outflow which was frankly bloody with multiple clots.  This was done in the ER on 05/22/2020 after I saw the patient.    Subjective:   Patient in bed, appears comfortable, denies any headache, no fever, no chest pain or pressure, no shortness of breath , no abdominal pain. No focal weakness.    Assessment  & Plan :     1. AMS due to UTI, sepsis and hypotension in a patient with indwelling Foley catheter POA and sacral decubitus ulcers POA who is quadriplegic.  Head CT unremarkable, being treated with empiric IV antibiotics which will be continued, has been adequately hydrated hence IV fluids will be  held.  Mentation has improved considerably, follow final cultures. He wants to be comfortable, will call Pall.Care for GOC.  2.  Indwelling Foley catheter, urethral injury with Foley catheter balloon being inflated in the urethra noted on 05/22/2020 morning.  Foley catheter was replaced properly and flushed, 800  cc of urine output with clots and blood noted on 05/22/2020.  We will continue to flush every 4 hours.   Addendum - 4pm - urethral bleeding has increased, 1 unit PRBC now, CBI, Urology called, DC Heparin.   3.  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 Lovell Sheehan.  Subsequent fall at home with progression to quadriplegia.  Supportive care.  Had detailed discussion with patient's daughter on 05/22/2020 & 05/23/20 - she is leaning towards comfort, seems to be in line with patient and other family members, will involve Pall.Care and CM .  4.  Chronic sacral decubitus ulcers POA.  Wound care on board, multiple ulcers unstageable picture as below, general surgery started, no debridement needed, continue wound care..  5.  Essential hypertension.  Blood pressure low hold blood pressure medications.  6.  Anemia of chronic disease with some evidence of low B12 and borderline iron deficiency as well.  Worse with heme dilution due to IV fluids, placed on vitamin B12 supplementation and iron.  We will continue to monitor, type cross done, transfuse if level drops further.    7.Chronic Eliquis use.  This was started for DVT prophylaxis at the rehab, do not think he needs that on a chronic basis, currently on heparin for prophylaxis.    Condition - Extremely Guarded  Family Communication  : daughter 05/22/20 - she is upset that he is getting in and out of the hospital and nothing is being done, informed her that I just saw her for the first time on  05/22/20.  Had detailed discussion with patient's daughter again on 05/23/20 - she is leaning towards comfort, seems to be in line with patient and other family members, will involve Pall.Care and CM .   Code Status :  Full  Consults  :  N Surg, CCS  Procedures  :    CT head - Non acute.  PUD Prophylaxis :    Disposition Plan  :    Status is: Inpatient  Remains inpatient appropriate because:Inpatient level of care appropriate due to severity of illness   Dispo: The patient is from: SNF              Anticipated d/c is to: SNF              Anticipated d/c date is: > 3 days              Patient currently is not medically stable to d/c.   DVT Prophylaxis  :   Heparin   Lab Results  Component Value Date   PLT 150 05/23/2020    Diet :  Diet Order            Diet full liquid Room service appropriate? Yes; Fluid consistency: Nectar Thick  Diet effective now                  Inpatient Medications  Scheduled Meds: . Chlorhexidine Gluconate Cloth  6 each Topical Daily  . collagenase   Topical Daily  . heparin  5,000 Units Subcutaneous Q8H  . pantoprazole  40 mg Oral QHS  . potassium chloride  40 mEq Oral Once  . tamsulosin  0.4 mg Oral Daily   Continuous Infusions: . ceFEPime (MAXIPIME) IV 2 g (05/23/20 0600)   PRN Meds:.ketorolac, [DISCONTINUED] ondansetron **OR** ondansetron (ZOFRAN) IV, Resource ThickenUp Clear  Antibiotics  :    Anti-infectives (  From admission, onward)   Start     Dose/Rate Route Frequency Ordered Stop   05/22/20 0500  ceFEPIme (MAXIPIME) 2 g in sodium chloride 0.9 % 100 mL IVPB        2 g 200 mL/hr over 30 Minutes Intravenous Every 12 hours 05/21/20 1614     05/21/20 2015  aztreonam (AZACTAM) 2 g in sodium chloride 0.9 % 100 mL IVPB        2 g 200 mL/hr over 30 Minutes Intravenous  Once 05/21/20 2010 05/21/20 2230   05/21/20 1730  vancomycin (VANCOREADY) IVPB 1750 mg/350 mL        1,750 mg 175 mL/hr over 120 Minutes Intravenous  Once  05/21/20 1722 05/21/20 2148   05/21/20 1545  ceFEPIme (MAXIPIME) 2 g in sodium chloride 0.9 % 100 mL IVPB        2 g 200 mL/hr over 30 Minutes Intravenous  Once 05/21/20 1535 05/21/20 1704       Time Spent in minutes  30   Susa Raring M.D on 05/23/2020 at 8:48 AM  To page go to [email or web address] - password [email or web address]  [facility] -  Office  [phone]    See all Orders from today for further details    Objective:   Vitals:   05/22/20 1030 05/22/20 1115 05/22/20 2131 05/23/20 0513  BP: 94/60 (!) 101/49 105/74 108/63  Pulse: 81 79 90 60  Resp: 12 12 18 18   Temp: 98.2 F (36.8 C) 97.8 F (36.6 C) 98.8 F (37.1 C) 98.3 F (36.8 C)  TempSrc:   Axillary Axillary  SpO2: 96% 97% 98% 96%  Weight:  120.7 kg    Height:  6' (1.829 m)      Wt Readings from Last 3 Encounters:  05/22/20 120.7 kg  05/03/20 84.7 kg  04/22/20 91 kg     Intake/Output Summary (Last 24 hours) at 05/23/2020 0848 Last data filed at 05/22/2020 1843 Gross per 24 hour  Intake 1077.08 ml  Output 500 ml  Net 577.08 ml     Physical Exam  Awake but tired, will answer basic questions, functional quadriparesis at baseline, wearing c-collar, Foley catheter with blood tinged urine, [place].AT,PERRAL Supple Neck,No JVD, No cervical lymphadenopathy appriciated.  Symmetrical Chest wall movement, Good air movement bilaterally, CTAB RRR,No Gallops, Rubs or new Murmurs, No Parasternal Heave +ve B.Sounds, Abd Soft, No tenderness, No organomegaly appriciated, No rebound - guarding or rigidity. Sacral decubitus ulcer unstageable as below.         Data Review:    CBC Recent Labs  Lab 05/21/20 1533 05/21/20 1533 05/21/20 1541 05/21/20 1544 05/22/20 0507 05/22/20 0816 05/23/20 0432  WBC 12.4*  --   --   --  14.1*  --  8.1  HGB 9.4*   < > 9.9* 10.2* 8.1* 7.7* 7.4*  HCT 30.9*   < > 29.0* 30.0* 25.9* 25.1* 23.9*  PLT 187  --   --   --  179  --  150  MCV 94.2  --   --   --  93.5  --  94.1  MCH 28.7   --   --   --  29.2  --  29.1  MCHC 30.4  --   --   --  31.3  --  31.0  RDW 15.7*  --   --   --  15.8*  --  15.9*  LYMPHSABS 1.1  --   --   --   --   --  1.4  MONOABS 1.1*  --   --   --   --   --  1.0  EOSABS 0.0  --   --   --   --   --  0.1  BASOSABS 0.0  --   --   --   --   --  0.0   < > = values in this interval not displayed.    Recent Labs  Lab 05/21/20 1533 05/21/20 1541 05/21/20 1544 05/21/20 1546 05/21/20 1640 05/21/20 2030 05/22/20 0507 05/23/20 0432  NA 140 143 142  --   --   --  137 141  K 4.1 4.0 4.0  --   --   --  3.7 3.3*  CL 102 100  --   --   --   --  103 104  CO2 28  --   --   --   --   --  25 26  GLUCOSE 174* 171*  --   --   --   --  124* 84  BUN 25* 30*  --   --   --   --  24* 23  CREATININE 2.06* 2.00*  --   --   --   --  1.50* 1.08  CALCIUM 8.0*  --   --   --   --   --  7.9* 8.1*  AST 42*  --   --   --   --   --  34 46*  ALT 32  --   --   --   --   --  27 35  ALKPHOS 83  --   --   --   --   --  71 67  BILITOT 0.4  --   --   --   --   --  0.6 0.8  ALBUMIN 1.8*  --   --   --   --   --  1.8* 1.6*  MG  --   --   --   --   --   --   --  1.9  CRP  --   --   --   --   --   --  0.7 0.7  PROCALCITON  --   --   --   --   --   --  0.12 0.11  LATICACIDVEN 3.4*  --   --   --  2.7* 2.2*  --   --   INR 1.5*  --   --   --   --   --  1.5*  --   TSH  --   --   --   --  1.839  --   --   --   AMMONIA  --   --   --  21  --   --   --   --   BNP  --   --   --   --   --   --   --  35.1    ------------------------------------------------------------------------------------------------------------------ No results for input(s): CHOL, HDL, LDLCALC, TRIG, CHOLHDL, LDLDIRECT in the last 72 hours.  No results found for: HGBA1C ------------------------------------------------------------------------------------------------------------------ Recent Labs    05/21/20 1640  TSH 1.839    Cardiac Enzymes No results for input(s): CKMB, TROPONINI, MYOGLOBIN in the last 168  hours.  Invalid input(s): CK ------------------------------------------------------------------------------------------------------------------    Component Value Date/Time   BNP 35.1 05/23/2020 0432    Micro Results Recent Results (from the past 240  hour(s))  Urine culture     Status: None   Collection Time: 05/21/20  3:33 PM   Specimen: In/Out Cath Urine  Result Value Ref Range Status   Specimen Description IN/OUT CATH URINE  Final   Special Requests NONE  Final   Culture   Final    NO GROWTH Performed at [facility], [address], [place], [place] [address]    Report Status 05/22/2020 FINAL  Final  Resp Panel by RT-PCR (Flu A&B, Covid) Nasopharyngeal Swab     Status: None   Collection Time: 05/21/20  3:35 PM   Specimen: Nasopharyngeal Swab; Nasopharyngeal(NP) swabs in vial transport medium  Result Value Ref Range Status   SARS Coronavirus 2 by RT PCR NEGATIVE NEGATIVE Final    Comment: (NOTE) SARS-CoV-2 target nucleic acids are NOT DETECTED.  The SARS-CoV-2 RNA is generally detectable in upper respiratory specimens during the acute phase of infection. The lowest concentration of SARS-CoV-2 viral copies this assay can detect is 138 copies/mL. A negative result does not preclude SARS-Cov-2 infection and should not be used as the sole basis for treatment or other patient management decisions. A negative result may occur with  improper specimen collection/handling, submission of specimen other than nasopharyngeal swab, presence of viral mutation(s) within the areas targeted by this assay, and inadequate number of viral copies(<138 copies/mL). A negative result must be combined with clinical observations, patient history, and epidemiological information. The expected result is Negative.  Fact Sheet for Patients:  BloggerCourse.com  Fact Sheet for Healthcare Providers:  SeriousBroker.it  This test is no t yet  approved or cleared by the [place] FDA and  has been authorized for detection and/or diagnosis of SARS-CoV-2 by FDA under an Emergency Use Authorization (EUA). This EUA will remain  in effect (meaning this test can be used) for the duration of the COVID-19 declaration under Section 564(b)(1) of the Act, 21 U.S.C.section 360bbb-3(b)(1), unless the authorization is terminated  or revoked sooner.       Influenza A by PCR NEGATIVE NEGATIVE Final   Influenza B by PCR NEGATIVE NEGATIVE Final    Comment: (NOTE) The Xpert Xpress SARS-CoV-2/FLU/RSV plus assay is intended as an aid in the diagnosis of influenza from Nasopharyngeal swab specimens and should not be used as a sole basis for treatment. Nasal washings and aspirates are unacceptable for Xpert Xpress SARS-CoV-2/FLU/RSV testing.  Fact Sheet for Patients: BloggerCourse.com  Fact Sheet for Healthcare Providers: SeriousBroker.it  This test is not yet approved or cleared by the [place] FDA and has been authorized for detection and/or diagnosis of SARS-CoV-2 by FDA under an Emergency Use Authorization (EUA). This EUA will remain in effect (meaning this test can be used) for the duration of the COVID-19 declaration under Section 564(b)(1) of the Act, 21 U.S.C. section 360bbb-3(b)(1), unless the authorization is terminated or revoked.  Performed at [facility], [address], [place], [place] [address]   Blood Culture (routine x 2)     Status: None (Preliminary result)   Collection Time: 05/21/20  4:40 PM   Specimen: BLOOD  Result Value Ref Range Status   Specimen Description BLOOD SITE NOT SPECIFIED  Final   Special Requests   Final    BOTTLES DRAWN AEROBIC ONLY Blood Culture results may not be optimal due to an inadequate volume of blood received in culture bottles   Culture   Final    NO GROWTH 2 DAYS Performed at [facility], [address],  [place], [place] [address]    Report Status PENDING  Incomplete  Blood Culture (routine x 2)     Status: None (Preliminary result)   Collection Time: 05/21/20  4:42 PM   Specimen: BLOOD  Result Value Ref Range Status   Specimen Description BLOOD SITE NOT SPECIFIED  Final   Special Requests   Final    BOTTLES DRAWN AEROBIC ONLY Blood Culture results may not be optimal due to an inadequate volume of blood received in culture bottles   Culture   Final    NO GROWTH 2 DAYS Performed at [facility], [address], [place], [place] [address]    Report Status PENDING  Incomplete    Radiology Reports DG Cervical Spine 1 View  Result Date: 04/26/2020 CLINICAL DATA:  Status post surgical spinal fusion. EXAM: DG CERVICAL SPINE - 1 VIEW COMPARISON:  Radiographs of the cervical spine 04/22/2020. FINDINGS: A single lateral view radiograph of the cervical spine is submitted. Redemonstrated sequela of prior C3-C5 ACDF with ventral plate and screw fixation. Redemonstrated interbody device at the C4-C5 level. IMPRESSION: Single lateral view radiograph of the cervical spine as above. Electronically Signed   By: Jackey Loge DO   On: 04/26/2020 14:58   CT Head Wo Contrast  Result Date: 05/21/2020 CLINICAL DATA:  Mental status change. EXAM: CT HEAD WITHOUT CONTRAST TECHNIQUE: Contiguous axial images were obtained from the base of the skull through the vertex without intravenous contrast. COMPARISON:  May 03, 2020 FINDINGS: Brain: No evidence of acute infarction, hemorrhage, hydrocephalus, extra-axial collection or mass lesion/mass effect. Mild brain parenchymal volume loss and deep white matter microangiopathy. Vascular: No hyperdense vessel or unexpected calcification. Skull: Normal. Negative for fracture or focal lesion. Sinuses/Orbits: No acute finding. Other: None. IMPRESSION: 1. No acute intracranial abnormality. 2. 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 Electronically  Signed   By: Ted Mcalpine M.D.   On: 05/21/2020 17:53   CT Head Wo Contrast  Result Date: 05/03/2020 CLINICAL DATA:  Facial trauma after multiple falls. EXAM: CT HEAD WITHOUT CONTRAST CT CERVICAL SPINE WITHOUT CONTRAST TECHNIQUE: Multidetector CT imaging of the head and cervical spine was performed following the standard protocol without intravenous contrast. Multiplanar CT image reconstructions of the cervical spine were also generated. COMPARISON:  April 21, 2020. FINDINGS: CT HEAD FINDINGS Brain: Mild chronic ischemic white matter disease is noted. No mass effect or midline shift is noted. Ventricular size is within normal limits. There is no evidence of mass lesion, hemorrhage or acute infarction. Vascular: No hyperdense vessel or unexpected calcification. Skull: Normal. Negative for fracture or focal lesion. Sinuses/Orbits: No acute finding. Other: None. CT CERVICAL SPINE FINDINGS Alignment: Normal. Skull base and vertebrae: No acute fracture is noted. Postsurgical changes are seen involving the C3, C4 and C5 vertebral bodies. Soft tissues and spinal canal: No prevertebral fluid or swelling. No visible canal hematoma. Disc levels: Status post surgical anterior fusion of C3-4 and C4-5. There is fusion of the C5-6 and C6-7 disc spaces secondary to degenerative change. Upper chest: Negative. Other: None. IMPRESSION: 1. Mild chronic ischemic white matter disease. No acute intracranial abnormality seen. 2. Postsurgical and degenerative changes as described above. No acute abnormality seen in the cervical spine. Electronically Signed   By: Lupita Raider M.D.   On: 05/03/2020 14:41   CT Chest Wo Contrast  Result Date: 05/03/2020 CLINICAL DATA:  Short of breath, fever, sepsis, multiple falls EXAM: CT CHEST WITHOUT CONTRAST TECHNIQUE: Multidetector CT  imaging of the chest was performed following the standard protocol without IV contrast. COMPARISON:  05/03/2020 FINDINGS: Cardiovascular: Unenhanced  imaging of the heart and great vessels demonstrates trace pericardial fluid. The heart is not enlarged. Aneurysmal dilatation of the ascending thoracic aorta measuring up to 4.4 cm. Evaluation of the vascular lumen is limited without intravenous contrast. Mediastinum/Nodes: No enlarged mediastinal or axillary lymph nodes. Thyroid gland, trachea, and esophagus demonstrate no significant findings. Lungs/Pleura: There is diffuse bronchial wall thickening. Complete opacification of the bronchus intermedius and right middle and right lower lobe bronchi are seen, along with partial opacification of the left lower lobe bronchus. There is patchy airspace disease within the right upper and left lower lobes. Complete consolidation of the right middle and right lower lobes with associated volume loss consistent with atelectasis. No effusion or pneumothorax. Upper Abdomen: Calcified gallstone is identified. The remainder of the upper abdomen is unremarkable. Musculoskeletal: Severe left shoulder osteoarthritis. No acute or destructive bony lesions. Reconstructed images demonstrate no additional findings. IMPRESSION: 1. Diffuse bronchial wall thickening, with patchy airspace disease of the right upper and left lower lobes, consistent with bronchopneumonia. 2. Opacification of the right middle, right lower, and left lower lobe bronchi, with complete collapse of the right middle and right lower lobes. This may be related to mucous plugging or underlying infection. 3. Trace pericardial fluid. 4. Cholelithiasis without cholecystitis. 5. 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 Recommend annual imaging followup by CTA or MRA. This recommendation follows 2010 ACCF/AHA/AATS/ACR/ASA/SCA/SCAI/SIR/STS/SVM Guidelines for the Diagnosis and Management of Patients with Thoracic Aortic Disease. Circulation. 2010; 121: N829-F621. Aortic aneurysm NOS (ICD10-I71.9) Electronically Signed   By: Sharlet Salina M.D.    On: 05/03/2020 16:31   CT Cervical Spine Wo Contrast  Result Date: 05/03/2020 CLINICAL DATA:  Facial trauma after multiple falls. EXAM: CT HEAD WITHOUT CONTRAST CT CERVICAL SPINE WITHOUT CONTRAST TECHNIQUE: Multidetector CT imaging of the head and cervical spine was performed following the standard protocol without intravenous contrast. Multiplanar CT image reconstructions of the cervical spine were also generated. COMPARISON:  April 21, 2020. FINDINGS: CT HEAD FINDINGS Brain: Mild chronic ischemic white matter disease is noted. No mass effect or midline shift is noted. Ventricular size is within normal limits. There is no evidence of mass lesion, hemorrhage or acute infarction. Vascular: No hyperdense vessel or unexpected calcification. Skull: Normal. Negative for fracture or focal lesion. Sinuses/Orbits: No acute finding. Other: None. CT CERVICAL SPINE FINDINGS Alignment: Normal. Skull base and vertebrae: No acute fracture is noted. Postsurgical changes are seen involving the C3, C4 and C5 vertebral bodies. Soft tissues and spinal canal: No prevertebral fluid or swelling. No visible canal hematoma. Disc levels: Status post surgical anterior fusion of C3-4 and C4-5. There is fusion of the C5-6 and C6-7 disc spaces secondary to degenerative change. Upper chest: Negative. Other: None. IMPRESSION: 1. Mild chronic ischemic white matter disease. No acute intracranial abnormality seen. 2. Postsurgical and degenerative changes as described above. No acute abnormality seen in the cervical spine. Electronically Signed   By: Lupita Raider M.D.   On: 05/03/2020 14:41   DG Chest Port 1 View  Result Date: 05/21/2020 CLINICAL DATA:  Found unresponsive. EXAM: PORTABLE CHEST 1 VIEW COMPARISON:  May 08, 2020 FINDINGS: There is stable elevation of the right hemidiaphragm. Mild bilateral infrahilar atelectasis is seen. There is no evidence of a pleural effusion or pneumothorax. The heart size and mediastinal  contours are within normal limits. The visualized skeletal structures are  unremarkable. IMPRESSION: Mild bilateral infrahilar atelectasis. Electronically Signed   By: Aram Candela M.D.   On: 05/21/2020 16:34   DG Chest Port 1 View  Result Date: 05/08/2020 CLINICAL DATA:  Mucous plugging EXAM: PORTABLE CHEST 1 VIEW COMPARISON:  Two days ago FINDINGS: Low volume chest with opacity at both lung bases. Air bronchograms seen on the left. No edema, effusion, or pneumothorax. Normal heart size IMPRESSION: Continued volume loss with extensive atelectasis at the bases. Electronically Signed   By: Marnee Spring M.D.   On: 05/08/2020 06:46   DG Chest Port 1 View  Result Date: 05/06/2020 CLINICAL DATA:  Shortness of breath EXAM: PORTABLE CHEST 1 VIEW COMPARISON:  May 05, 2020 FINDINGS: Persistent right pleural effusion with areas of atelectatic change in the lung bases noted. Heart size and pulmonary vascularity are normal. No adenopathy. No bone lesions. IMPRESSION: Stable right pleural effusion with bibasilar atelectasis. A degree of superimposed infiltrate in the right base cannot be excluded. Stable cardiac silhouette. Electronically Signed   By: Bretta Bang III M.D.   On: 05/06/2020 08:29   DG Chest Port 1 View  Result Date: 05/05/2020 CLINICAL DATA:  Respiratory distress. EXAM: PORTABLE CHEST 1 VIEW COMPARISON:  05/03/2020 FINDINGS: The cardiac silhouette, mediastinal and hilar contours are within normal limits and stable. Moderate-sized right pleural effusion with overlying atelectasis or infiltrate. Patchy left basilar infiltrate. IMPRESSION: Moderate-sized right pleural effusion with overlying atelectasis or infiltrate. Patchy left basilar infiltrate. Electronically Signed   By: Rudie Meyer M.D.   On: 05/05/2020 07:15   DG CHEST PORT 1 VIEW  Result Date: 05/04/2020 CLINICAL DATA:  Respiratory distress EXAM: PORTABLE CHEST 1 VIEW COMPARISON:  05/03/2020 FINDINGS: Unchanged right  basilar consolidation. Cardiomediastinal contours and pleural spaces are normal. Left lung is clear. IMPRESSION: Unchanged right basilar consolidation. Electronically Signed   By: Deatra Robinson M.D.   On: 05/04/2020 00:07   DG Chest Port 1 View  Result Date: 05/03/2020 CLINICAL DATA:  Questionable sepsis. EXAM: PORTABLE CHEST 1 VIEW COMPARISON:  01/09/2020. FINDINGS: The heart size and mediastinal contours are within normal limits. Low lung volumes. Left basilar opacities. No visible pleural effusions or pneumothorax. Elevated right hemidiaphragm. No acute osseous abnormality. Bilateral shoulder degenerative change. Partially imaged cervical ACDF. IMPRESSION: Low lung volumes with left basilar opacities, which may represent atelectasis, aspiration and/or pneumonia. Dedicated PA and lateral radiographs could better characterize if clinically indicated. Electronically Signed   By: Feliberto Harts MD   On: 05/03/2020 13:12   DG Swallowing Func-Speech Pathology  Result Date: 05/04/2020 Objective Swallowing Evaluation: Type of Study: Bedside Swallow Evaluation  Patient Details Name: Shannon [name] MRN: [id number] Date of Birth: May 17, 1946 Today's Date: 05/04/2020 Time: SLP Start Time (ACUTE ONLY): 1044 -SLP Stop Time (ACUTE ONLY): 1103 SLP Time Calculation (min) (ACUTE ONLY): 19 min Past Medical History: Past Medical History: Diagnosis Date . Asthma  . 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 Newell Coral, 1992-->surg at [facility].  Summer 2021->Cervicalgia,arm pain, hand atrophy RUE wkness, hyperreflex (Dr. 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 Hay fever  . Hypertension  . Osteoarthritis of both hands  Past Surgical History: Past Surgical History: Procedure Laterality Date . ANTERIOR CERVICAL DECOMP/DISCECTOMY FUSION N/A 04/05/2020  Procedure: ANTERIOR CERVICAL  DECOMPRESSION/DISCECTOMY FUSION, INTERBODY PROSTHESIS, PLATE/SCREWS CERVICAL THREE-CERVICAL FOUR, CERVICAL FOUR- CERVICAL FIVE;  Surgeon: Tressie Stalker, MD;  Location: [facility] OR;  Service: Neurosurgery;  Laterality: N/A; . ANTERIOR CERVICAL DECOMP/DISCECTOMY FUSION N/A 04/22/2020  Procedure: Reexploration of anterior cervical wound for epidural hematoma;  Surgeon: Donalee Citrin, MD;  Location: [facility] OR;  Service: Neurosurgery;  Laterality: N/A; . BACK SURGERY   . INCISION AND DRAINAGE Left 2012  L hand infection . ROTATOR CUFF REPAIR Right   x 3 . SPINE SURGERY   HPI: 74 year old white male who presented with right lower lobe pneumonia and known history of  Quadriparesis.  He originally had a C3-4 [place] 4-5 anterior cervical disc asked to me with decompression, C3 3-4 and C4-5 interbody arthrodesis with local autograft bone, anterior cervical plating of C3-C5 on 04/05/2020.  He then went to rehab and was discharged.  On 04/21/2020 the patient presented to the emergency room with progressive bilateral quadriplegia.  Patient went to the operating room and had decompression of a vertebral hematoma.  And been discharged on 11 10/2019 and readmitted on 05/03/2020 for increasing shortness of breath and productive cough.  Chest x-ray demonstrated a new right lower lobe infiltrate with compressive atelectasis, concerning for aspiration pneumonia.  Pt had MBSS during most recent admission on 04/25/2020 with recommendations for puree and thin liquids, advanced to Dys3 prior to discharge  Subjective: alert, cooperative, pleasant, participative. Assessment / Plan / Recommendation CHL IP CLINICAL IMPRESSIONS 05/04/2020 Clinical Impression Pt presents with moderate oropharyngeal dysphagia c/b decreased base of tongue retraction, delayed swallow inititation, incomplete laryngeal closure, reduced pharyngeal perstalsis, decreased UES opening, and diminished sensation. These deficits resulted in silent aspiration of thin liquid and nectar thick  liquid by straw prior to the swallow. With nectar thick liquid by cup, only transient penetration was seen. There was significant pharyngeal residue with puree and solid consistencies which was reduced but not fully cleared with liquid wash. There is noticeable prevertebral edema in pharynx which appears to be impacting swallow function, but pt is able to acheive adequate epiglottic deflection. This study represents a marked decline in swallow function from MBSS on 04/25/20; however, from review of images in both studies, amount of edema appears comparable to naked eye without any accurate measurement, or comparing level of magnification during each study. Because of recent ACDF (11/13) and revision (10/29), and presence of cervical collar, SLP intervention options are limited outside of modification of diet. Pt cannot trial compensatory strategies requiring change to head/neck positioning, and many swallow exercises cannot be executed in this position. Pt may benefit from some tongue strengthening exercises to improve swallow drive and decrease vallecula residue. Recommend initiating full liquid diet with nectar thick liquids, by cup sip only. NO STRAWS. Please crush medications if permissible. Called neice, [name], and shared results of today's study which she will pass on to pt's daughter who will be coming to town tomorrow. SLP Visit Diagnosis -- Attention and concentration deficit following -- Frontal lobe and executive function deficit following -- Impact on safety and function --   CHL IP TREATMENT RECOMMENDATION 05/04/2020 Treatment Recommendations Therapy as outlined in treatment plan below   Prognosis 05/04/2020 Prognosis for Safe Diet Advancement Fair Barriers to Reach Goals Cognitive deficits Barriers/Prognosis Comment -- CHL IP DIET RECOMMENDATION 05/04/2020 SLP Diet Recommendations Nectar thick liquid Liquid Administration via No straw Medication Administration -- Compensations Slow rate;Small  sips/bites;Follow solids with liquid Postural Changes Seated upright at 90 degrees;Remain semi-upright after after feeds/meals (Comment)   CHL IP OTHER RECOMMENDATIONS 05/04/2020 Recommended Consults -- Oral Care Recommendations Oral care BID Other Recommendations --   CHL IP FOLLOW UP RECOMMENDATIONS 05/04/2020 Follow up Recommendations (  No Data)   CHL IP FREQUENCY AND DURATION 05/04/2020 Speech Therapy Frequency (ACUTE ONLY) min 2x/week Treatment Duration 2 weeks      CHL IP ORAL PHASE 05/04/2020 Oral Phase -- Oral - Pudding Teaspoon -- Oral - Pudding Cup -- Oral - Honey Teaspoon -- Oral - Honey Cup -- Oral - Nectar Teaspoon -- Oral - Nectar Cup WFL Oral - Nectar Straw Premature spillage;Decreased bolus cohesion Oral - Thin Teaspoon -- Oral - Thin Cup Premature spillage Oral - Thin Straw Premature spillage Oral - Puree WFL Oral - Mech Soft WFL Oral - Regular -- Oral - Multi-Consistency -- Oral - Pill WFL Oral Phase - Comment --  CHL IP PHARYNGEAL PHASE 05/04/2020 Pharyngeal Phase Impaired Pharyngeal- Pudding Teaspoon -- Pharyngeal -- Pharyngeal- Pudding Cup -- Pharyngeal -- Pharyngeal- Honey Teaspoon -- Pharyngeal -- Pharyngeal- Honey Cup -- Pharyngeal -- Pharyngeal- Nectar Teaspoon -- Pharyngeal -- Pharyngeal- Nectar Cup -- Pharyngeal -- Pharyngeal- Nectar Straw -- Pharyngeal -- Pharyngeal- Thin Teaspoon -- Pharyngeal -- Pharyngeal- Thin Cup -- Pharyngeal -- Pharyngeal- Thin Straw -- Pharyngeal -- Pharyngeal- Puree -- Pharyngeal -- Pharyngeal- Mechanical Soft -- Pharyngeal -- Pharyngeal- Regular -- Pharyngeal -- Pharyngeal- Multi-consistency -- Pharyngeal -- Pharyngeal- Pill -- Pharyngeal -- Pharyngeal Comment --  CHL IP CERVICAL ESOPHAGEAL PHASE 05/04/2020 Cervical Esophageal Phase Impaired Pudding Teaspoon -- Pudding Cup -- Honey Teaspoon -- Honey Cup -- Nectar Teaspoon -- Nectar Cup Reduced cricopharyngeal relaxation Nectar Straw Reduced cricopharyngeal relaxation Thin Teaspoon -- Thin Cup Reduced  cricopharyngeal relaxation Thin Straw Reduced cricopharyngeal relaxation Puree Reduced cricopharyngeal relaxation Mechanical Soft Reduced cricopharyngeal relaxation Regular -- Multi-consistency -- Pill Reduced cricopharyngeal relaxation Cervical Esophageal Comment trace esophageal retention of contrast Kerrie PleasureLeigh E Borum, MA, CCC-SLP Acute Rehabilitation Services Office: [phone] 05/04/2020, 11:59 AM              DG Swallowing Func-Speech Pathology  Result Date: 04/25/2020 Objective Swallowing Evaluation: Type of Study: MBS-Modified Barium Swallow Study  Patient Details Name: Shannon JeffersonRonald R [name] MRN: [id number] Date of Birth: October 03, 1945 Today's Date: 04/25/2020 Time: SLP Start Time (ACUTE ONLY): 1043 -SLP Stop Time (ACUTE ONLY): 1057 SLP Time Calculation (min) (ACUTE ONLY): 14 min Past Medical History: Past Medical History: Diagnosis Date . Asthma  . 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 Newell CoralNudelman, 1992[facility]  Summer 2021->Cervicalgia,arm pain, hand atrophy RUE wkness, hyperreflex (Dr. 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 Hay fever  . Hypertension  . Osteoarthritis of both hands  Past Surgical History: Past Surgical History: Procedure Laterality Date . ANTERIOR CERVICAL DECOMP/DISCECTOMY FUSION N/A 04/05/2020  Procedure: ANTERIOR CERVICAL DECOMPRESSION/DISCECTOMY FUSION, INTERBODY PROSTHESIS, PLATE/SCREWS CERVICAL THREE-CERVICAL FOUR, CERVICAL FOUR- CERVICAL FIVE;  Surgeon: Tressie StalkerJenkins, Jeffrey, MD;  [facility];  Service: Neurosurgery;  Laterality: N/A; . ANTERIOR CERVICAL DECOMP/DISCECTOMY FUSION N/A 04/22/2020  Procedure: Reexploration of anterior cervical wound for epidural hematoma;  Surgeon: Donalee Citrinram, Gary, MD;  [facility];  Service: Neurosurgery;  Laterality: N/A; . BACK SURGERY   . INCISION AND DRAINAGE Left 2012  L hand infection . ROTATOR CUFF REPAIR  Right   x 3 . SPINE SURGERY   HPI: Pt is a 74 y.o. 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 Pt was  admitted on 04/21/20 from CIR for dense quadriparesis secondary to epidural hematoma behind the  C4-5 interbody implant. Pt s/p evacuation of hemoatoma, removal of hardware and re-implantation of titanium cage packed with autograft bone on 04/22/20.  Pt in hard collar post op. CT head 10/29 negative for acute changes.  Subjective: alert, cooperative Assessment / Plan / Recommendation CHL IP CLINICAL IMPRESSIONS 04/25/2020 Clinical Impression Pt presents with a mild oropharyngeal swallow but with no aspiration or penetration observed. He has reduced posterior propulsion orally with mildly slow mastication and lingual rocking with solids. Lingual residue c/b a mild coating on the tongue is also noted with solids. His base of tongue retraction is mildly reduced, leading to mild vallecular residue with solids. His UES is opening also appears to be mildly reduced, which could be impacted by location of cervical hardware. Recommend continuing current diet with SLP f/u for advancement to more solid textures, which can be done clinically.  SLP Visit Diagnosis Dysphagia, oropharyngeal phase (R13.12) Attention and concentration deficit following -- Frontal lobe and executive function deficit following -- Impact on safety and function Mild aspiration risk   CHL IP TREATMENT RECOMMENDATION 04/25/2020 Treatment Recommendations Therapy as outlined in treatment plan below   Prognosis 04/25/2020 Prognosis for Safe Diet Advancement Good Barriers to Reach Goals Cognitive deficits Barriers/Prognosis Comment -- CHL IP DIET RECOMMENDATION 04/25/2020 SLP Diet Recommendations Dysphagia 1 (Puree) solids;Thin liquid Liquid Administration via Cup;Straw Medication Administration Whole meds with puree  Compensations Slow rate;Small sips/bites Postural Changes Seated upright at 90 degrees   CHL IP OTHER RECOMMENDATIONS 04/25/2020 Recommended Consults -- Oral Care Recommendations Oral care BID Other Recommendations --   CHL IP FOLLOW UP RECOMMENDATIONS 04/25/2020 Follow up Recommendations Skilled Nursing facility   [facility] IP FREQUENCY AND DURATION 04/25/2020 Speech Therapy Frequency (ACUTE ONLY) min 2x/week Treatment Duration 2 weeks      CHL IP ORAL PHASE 04/25/2020 Oral Phase Impaired Oral - Pudding Teaspoon -- Oral - Pudding Cup -- Oral - Honey Teaspoon -- Oral - Honey Cup -- Oral - Nectar Teaspoon -- Oral - Nectar Cup -- Oral - Nectar Straw -- Oral - Thin Teaspoon -- Oral - Thin Cup Reduced posterior propulsion Oral - Thin Straw Reduced posterior propulsion Oral - Puree Reduced posterior propulsion;Delayed oral transit;Lingual/palatal residue Oral - Mech Soft Reduced posterior propulsion;Delayed oral transit;Lingual/palatal residue;Impaired mastication Oral - Regular -- Oral - Multi-Consistency -- Oral - Pill Reduced posterior propulsion Oral Phase - Comment --  CHL IP PHARYNGEAL PHASE 04/25/2020 Pharyngeal Phase Impaired Pharyngeal- Pudding Teaspoon -- Pharyngeal -- Pharyngeal- Pudding Cup -- Pharyngeal -- Pharyngeal- Honey Teaspoon -- Pharyngeal -- Pharyngeal- Honey Cup -- Pharyngeal -- Pharyngeal- Nectar Teaspoon -- Pharyngeal -- Pharyngeal- Nectar Cup -- Pharyngeal -- Pharyngeal- Nectar Straw -- Pharyngeal -- Pharyngeal- Thin Teaspoon -- Pharyngeal -- Pharyngeal- Thin Cup Reduced tongue base retraction;Pharyngeal residue - valleculae Pharyngeal -- Pharyngeal- Thin Straw Reduced tongue base retraction;Pharyngeal residue - valleculae Pharyngeal -- Pharyngeal- Puree Reduced tongue base retraction;Pharyngeal residue - valleculae Pharyngeal -- Pharyngeal- Mechanical Soft Reduced tongue base retraction;Pharyngeal residue - valleculae Pharyngeal -- Pharyngeal- Regular -- Pharyngeal -- Pharyngeal- Multi-consistency --  Pharyngeal -- Pharyngeal- Pill Reduced tongue base retraction;Pharyngeal residue - valleculae Pharyngeal -- Pharyngeal Comment --  CHL IP CERVICAL ESOPHAGEAL PHASE 04/25/2020 Cervical Esophageal Phase Impaired Pudding Teaspoon -- Pudding Cup -- Honey Teaspoon -- Honey Cup -- Nectar Teaspoon -- Nectar Cup -- Nectar Straw -- Thin Teaspoon -- Thin Cup Reduced cricopharyngeal relaxation Thin Straw Reduced cricopharyngeal relaxation Puree Reduced cricopharyngeal relaxation Mechanical Soft Reduced cricopharyngeal relaxation Regular -- Multi-consistency -- Pill Reduced cricopharyngeal relaxation Cervical Esophageal Comment --  Mahala Menghini., M.A. CCC-SLP Acute Rehabilitation Services Pager [phone] Office [phone] 04/25/2020, 4:13 PM
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
## 2020-05-23 NOTE — Progress Notes (Signed)
PT Cancellation Note  Patient Details Name: Shannon [name] MRN: [id number] DOB: 03-08-1946   Cancelled Treatment:    Reason Eval/Treat Not Completed: Other (comment) (pt stated he wants to remain comfortable at present and doesn't want to attempt mobility. Per RN, goals of care meeting pending. Will follow.)  Tamala Ser PT 05/23/2020  Acute Rehabilitation Services Pager [phone] Office [phone]

## 2020-05-23 NOTE — Progress Notes (Signed)
  Speech Language Pathology Treatment: Dysphagia  Patient Details Name: Shannon [name] MRN: [id number] DOB: Apr 30, 1946 Today's Date: 05/23/2020 Time: 2979-8921 SLP Time Calculation (min) (ACUTE ONLY): 15 min  Assessment / Plan / Recommendation Clinical Impression  Therapist is familiar with pt (and niece) from previous admission. His vocal quality is stronger, less confused and smiling. There were no overt concerns with aspiration in pt with chronic dysphagia likely due to cervical vertebrae differences with ACDF 03/2020. His oral transit and swallow onset appeared brisk; it is audible with sub swallows in line with pharyngeal residue observed on MBS last admission. Vocal quality clear.  Discussed po texture and pt/niece reiterated he is on full liquids, thickened to nectar consistency and prefers to remain on this. Educated re: safe posture and strategy of intermittent throat clear. Continue this texture and at SNF- pt and niece in agreement to plan to sign off at this time. Reconsult if needed.     HPI HPI: Shannon Dudzinski Smithis a 74 y.o.malewith medical history significant ofquadriplegia which follows surgery for Chiari I malformation with syringomyelia surgery in October 2021.  He originally had a C3-4 [place] 4-5 anterior cervical disc ACDF with decompression, C3 3-4 and C4-5 interbody arthrodesis with local autograft bone, anterior cervical plating of C3-C5 on 04/05/2020.  He then went home and had a fall from his bed, he was then hospitalized and from here he was sent to a nursing home, he has been quadriplegic since then, he has also developed sacral decubitus ulcers. Was seen for MBS on 05/04/20 "deficits resulted in silent aspiration of thin liquid and nectar thick liquid by straw prior to the swallow. With nectar thick liquid by cup, only transient penetration was seen. There was significant pharyngeal residue with puree and solid consistencies which was reduced but not fully cleared with  liquid wash." Now admitted for AMS due to UTI, sepsis and hypotension in a patient with indwelling Foley catheter POA and sacral decubitus ulcers POA who is quadriplegic.       SLP Plan  All goals met;Discharge SLP treatment due to (comment)       Recommendations  Diet recommendations: Nectar-thick liquid;Other(comment) (full liquid texture- nectar thick ) Liquids provided via: Cup Medication Administration: Crushed with puree Supervision: Full supervision/cueing for compensatory strategies;Staff to assist with self feeding Compensations: Slow rate;Small sips/bites;Follow solids with liquid Postural Changes and/or Swallow Maneuvers: Seated upright 90 degrees                Oral Care Recommendations: Oral care BID Follow up Recommendations: Skilled Nursing facility SLP Visit Diagnosis: Dysphagia, oropharyngeal phase (R13.12) Plan: All goals met;Discharge SLP treatment due to (comment)                       Shannon [name] 05/23/2020, 9:19 AM Shannon [name].Ed Risk analyst [phone] Office [phone]

## 2020-05-23 NOTE — Progress Notes (Signed)
Spoke to patient this morning regarding goals of care. Patient is alert and oriented x4, has situational awareness, and is fully participatory in his care. Patient states that he no longer wants to be aggressive with his care and would want to go home and be comfortable. Patient states he does not wish to continue to be hospitalized and would like to live out the rest of the time he has left with his family. Patient states that he would like his daughter and his niece to be aware and involved in his plan of care. Patient also states that if he were ever unable to make his decisions, that he would want his daughter to make his decisions. All of the above was discussed with patient privately. Dr. Thedore Mins and Tammy Sours, CSW made aware.

## 2020-05-23 NOTE — Progress Notes (Signed)
Physical Therapy Wound Evaluation/Treatment Patient Details  Name: Shannon [name] MRN: [id number] Date of Birth: [birth year]  Today's Date: 05/23/2020 Time: 3875-6433 Time Calculation (min): 73 min  Subjective  Subjective: Pt pleasant and agreeable to hydro Patient and Family Stated Goals: Heal wound Prior Treatments: Dressing changes  Pain Score:    Wound Assessment  Pressure Injury 05/23/20 Sacrum Unstageable - Full thickness tissue loss in which the base of the injury is covered by slough (yellow, tan, gray, green or brown) and/or eschar (tan, brown or black) in the wound bed. (Active)  Wound Image   05/23/20 1200  Dressing Type ABD;Barrier Film (skin prep);Gauze (Comment);Moist to dry 05/23/20 1200  Dressing Changed;Clean;Dry;Intact 05/23/20 1200  Dressing Change Frequency Daily 05/23/20 1200  State of Healing Eschar 05/23/20 1200  Site / Wound Assessment Red;Black 05/23/20 1200  % Wound base Red or Granulating 30% 05/23/20 1200  % Wound base Yellow/Fibrinous Exudate 0% 05/23/20 1200  % Wound base Black/Eschar 70% 05/23/20 1200  % Wound base Other/Granulation Tissue (Comment) 0% 05/23/20 1200  Peri-wound Assessment Intact;Pink 05/23/20 1200  Wound Length (cm) 11 cm 05/23/20 1200  Wound Width (cm) 9.5 cm 05/23/20 1200  Wound Depth (cm) 0.1 cm 05/23/20 1200  Wound Surface Area (cm^2) 104.5 cm^2 05/23/20 1200  Wound Volume (cm^3) 10.45 cm^3 05/23/20 1200  Margins Unattached edges (unapproximated) 05/23/20 1200  Drainage Amount Minimal 05/23/20 1200  Drainage Description Serosanguineous 05/23/20 1200  Treatment Debridement (Selective);Hydrotherapy (Pulse lavage);Packing (Saline gauze) 05/23/20 1200   Hydrotherapy Pulsed lavage therapy - wound location: Sacrum Pulsed Lavage with Suction (psi): 12 psi Pulsed Lavage with Suction - Normal Saline Used: 1000 mL Pulsed Lavage Tip: Tip with splash shield Selective Debridement Selective Debridement - Location: Sacrum Selective  Debridement - Tools Used: Forceps;Scalpel;Scissors Selective Debridement - Tissue Removed: Black eschar   Wound Assessment and Plan  Wound Therapy - Assess/Plan/Recommendations Wound Therapy - Clinical Statement: Pt presents to hydrotherapy with an unstagable pressure injury to the sacrum. Able to initiate debridement this session. Santyl not present in room and not sent up to unit by end of hydrotherapy session. Dressing placed without Santyl and RN notified to apply Santyl when it arrives to the unit. Noted pt on a regular hospital bed, and air mattress ordered as well as soft touch call bell as pt is unable to use the regular call bell. Will continue to follow for selective removal of unviable tissue, to decrease bioburden, and promote wound bed healing.  Wound Therapy - Functional Problem List: Decreased mobility and global weakness Factors Delaying/Impairing Wound Healing: Immobility Hydrotherapy Plan: Debridement;Dressing change;Patient/family education;Pulsatile lavage with suction Wound Therapy - Frequency: 6X / week Wound Therapy - Follow Up Recommendations: Skilled nursing facility Wound Plan: See above  Wound Therapy Goals- Improve the function of patient's integumentary system by progressing the wound(s) through the phases of wound healing (inflammation - proliferation - remodeling) by: Decrease Necrotic Tissue to: 20% Decrease Necrotic Tissue - Progress: Goal set today Increase Granulation Tissue to: 80% Increase Granulation Tissue - Progress: Goal set today Time For Goal Achievement: 7 days Wound Therapy - Potential for Goals: Fair  Goals will be updated until maximal potential achieved or discharge criteria met.  Discharge criteria: when goals achieved, discharge from hospital, MD decision/surgical intervention, no progress towards goals, refusal/missing three consecutive treatments without notification or medical reason.  GP     Thelma Comp 05/23/2020, 1:58 PM    Rolinda Roan, PT, DPT Acute Rehabilitation Services Pager: [phone] Office: [phone]

## 2020-05-24 DIAGNOSIS — N390 Urinary tract infection, site not specified: Secondary | ICD-10-CM | POA: Diagnosis not present

## 2020-05-24 DIAGNOSIS — A419 Sepsis, unspecified organism: Secondary | ICD-10-CM | POA: Diagnosis not present

## 2020-05-24 DIAGNOSIS — I10 Essential (primary) hypertension: Secondary | ICD-10-CM | POA: Diagnosis not present

## 2020-05-24 DIAGNOSIS — G8250 Quadriplegia, unspecified: Secondary | ICD-10-CM | POA: Diagnosis not present

## 2020-05-24 LAB — CBC WITH DIFFERENTIAL/PLATELET
Abs Immature Granulocytes: 0.25 10*3/uL — ABNORMAL HIGH (ref 0.00–0.07)
Basophils Absolute: 0.1 10*3/uL (ref 0.0–0.1)
Basophils Relative: 1 %
Eosinophils Absolute: 0.2 10*3/uL (ref 0.0–0.5)
Eosinophils Relative: 1 %
HCT: 27.4 % — ABNORMAL LOW (ref 39.0–52.0)
Hemoglobin: 8.9 g/dL — ABNORMAL LOW (ref 13.0–17.0)
Immature Granulocytes: 2 %
Lymphocytes Relative: 17 %
Lymphs Abs: 1.8 10*3/uL (ref 0.7–4.0)
MCH: 30.1 pg (ref 26.0–34.0)
MCHC: 32.5 g/dL (ref 30.0–36.0)
MCV: 92.6 fL (ref 80.0–100.0)
Monocytes Absolute: 1.1 10*3/uL — ABNORMAL HIGH (ref 0.1–1.0)
Monocytes Relative: 10 %
Neutro Abs: 7.3 10*3/uL (ref 1.7–7.7)
Neutrophils Relative %: 69 %
Platelets: 179 10*3/uL (ref 150–400)
RBC: 2.96 MIL/uL — ABNORMAL LOW (ref 4.22–5.81)
RDW: 15.7 % — ABNORMAL HIGH (ref 11.5–15.5)
WBC: 10.6 10*3/uL — ABNORMAL HIGH (ref 4.0–10.5)
nRBC: 0 % (ref 0.0–0.2)

## 2020-05-24 LAB — PROCALCITONIN: Procalcitonin: <0.1 ng/mL

## 2020-05-24 LAB — TYPE AND SCREEN
ABO/RH(D): A POS
Antibody Screen: NEGATIVE
Unit division: 0

## 2020-05-24 LAB — COMPREHENSIVE METABOLIC PANEL
ALT: 44 U/L (ref 0–44)
AST: 52 U/L — ABNORMAL HIGH (ref 15–41)
Albumin: 1.8 g/dL — ABNORMAL LOW (ref 3.5–5.0)
Alkaline Phosphatase: 74 U/L (ref 38–126)
Anion gap: 9 (ref 5–15)
BUN: 22 mg/dL (ref 8–23)
CO2: 25 mmol/L (ref 22–32)
Calcium: 8.1 mg/dL — ABNORMAL LOW (ref 8.9–10.3)
Chloride: 107 mmol/L (ref 98–111)
Creatinine, Ser: 0.99 mg/dL (ref 0.61–1.24)
GFR, Estimated: >60 mL/min (ref >60)
Glucose, Bld: 162 mg/dL — ABNORMAL HIGH (ref 70–99)
Potassium: 3.7 mmol/L (ref 3.5–5.1)
Sodium: 141 mmol/L (ref 135–145)
Total Bilirubin: 0.9 mg/dL (ref 0.3–1.2)
Total Protein: 5.4 g/dL — ABNORMAL LOW (ref 6.5–8.1)

## 2020-05-24 LAB — C-REACTIVE PROTEIN: CRP: 0.6 mg/dL (ref <1.0)

## 2020-05-24 LAB — BPAM RBC
Blood Product Expiration Date: 202112302359
ISSUE DATE / TIME: 202111301715
Unit Type and Rh: 6200

## 2020-05-24 LAB — BRAIN NATRIURETIC PEPTIDE: B Natriuretic Peptide: 32 pg/mL (ref 0.0–100.0)

## 2020-05-24 LAB — MAGNESIUM: Magnesium: 1.9 mg/dL (ref 1.7–2.4)

## 2020-05-24 MED ORDER — MORPHINE SULFATE (PF) 2 MG/ML IV SOLN
2.0000 mg | Freq: Once | INTRAVENOUS | Status: DC
  Filled 2020-05-24: qty 1

## 2020-05-24 MED ORDER — BELLADONNA ALKALOIDS-OPIUM 16.2-60 MG RE SUPP
1.0000 | Freq: Three times a day (TID) | RECTAL | Status: DC | PRN
  Administered 2020-05-24 – 2020-05-25 (×4): 1 via RECTAL
  Filled 2020-05-24 (×4): qty 1

## 2020-05-24 MED ORDER — SODIUM CHLORIDE 0.9 % IV SOLN
1.0000 g | INTRAVENOUS | Status: AC
  Administered 2020-05-24 – 2020-05-31 (×8): 1 g via INTRAVENOUS
  Filled 2020-05-24 (×2): qty 1
  Filled 2020-05-24: qty 10
  Filled 2020-05-24 (×5): qty 1

## 2020-05-24 NOTE — Progress Notes (Signed)
Urologist at bedside to place patient on continuous bladder irrigation.  24Fr 3-way foley placed per MD.

## 2020-05-24 NOTE — Progress Notes (Signed)
Contacted on call urology to inform patient was complaining of constant bladder spasms, and CBI was no longer working. Per urologist stopped CBI.

## 2020-05-24 NOTE — Progress Notes (Signed)
Alerted MD Rito Ehrlich patient is not tolerating CBI. Patient HR sustaining 140's-150's. Patient CBI constantly clotting and nurse having to manual irrigate. Patient continuously crying in pain. Per MD contact urology.

## 2020-05-24 NOTE — Progress Notes (Signed)
HR 130's, pain med given and irrigated foley, HR decreased to 90s

## 2020-05-24 NOTE — Progress Notes (Signed)
Transferred patient into specialty bed

## 2020-05-24 NOTE — Progress Notes (Signed)
PT Cancellation Note  Patient Details Name: Shannon [name] MRN: [id number] DOB: [birth year]   Cancelled Treatment:    Reason Eval/Treat Not Completed: Other (comment). Per chart pt vocalized wanting to return home and be comfortable. GOC meeting is trying to be planned. PT went in to speak with patient about his desires as pt now with quadriparesis and will require significant assist and equip for safe transition home. However pt confused and unable to follow conversation today, pervious notes state patient oriented and very aware and involved in care. Pt's HR is in 140s at rest, RN report pt in a lot of pain regarding catheter and is in/out of clear mentation today. RN advised PT not to call daughter as dtr very adamant about aggressive care and is very upset with [name] and will not speak to any provider regarding care of her father. Unsure of direction of care however pt will need 24/7 assist, hospital bed, w/c with cushion and elevated leg rests for safe transition home if that is what patient desires as he is dependent at this time for all care. Acute PT to continue to follow/monitor patient and assist as needed with evaluation and discharge planning.  Lewis Shock, PT, DPT Acute Rehabilitation Services Pager #: [phone] Office #: [phone]    [name] 05/24/2020, 10:28 AM

## 2020-05-24 NOTE — Progress Notes (Signed)
OT Cancellation Note  Patient Details Name: Yuvaan Olander MRN: [id number] DOB: 26-Mar-1946   Cancelled Treatment:    Reason Eval/Treat Not Completed: Other (comment) Per chart pt vocalized wanting to return home and be comfortable. GOC meeting is trying to be planned to assess and coordinate pt/family wishes. OT to hold off on evaluation today. Will continue to follow pt and monitor any needs to assist with discharge planning.   Lorre Munroe 05/24/2020, 12:05 PM

## 2020-05-24 NOTE — Progress Notes (Signed)
Attempted to get supplies from materials management for CBI, no return phone call, will continue to try to reach them.

## 2020-05-24 NOTE — Progress Notes (Signed)
CBI set to slow. Draining mixture of dark red and bright red drainage. Patient resting. No complaints of spasms at this time.

## 2020-05-24 NOTE — Progress Notes (Signed)
S/w Dr. Marlou Porch who advised he would order patient suppository for bladder spasms. Was advised to restart CBI, after suppository.

## 2020-05-24 NOTE — Progress Notes (Addendum)
Patient tachycardic in 110's, assessed patient, noticed large clots coming around foley, and urine draining around foley.  No new urine in the foley bag noted.  Patient c/o low ab pressure and pain.  Irrigated foley and removed several large clots, pressure relieved. Patient resting.  HR 90's.  Will continue to gather supplies for CBI

## 2020-05-24 NOTE — Progress Notes (Signed)
S/w Dr. Marlou Porch, advised that spasms had returned for patient while CBI was set to lowest setting. Urine,blood, and saline also leaking out rapidly around catheter. Per Dr. Marlou Porch verbal orders, deflated catheter balloon, advanced catheter further into bladder, reinflated balloon with 20 ml sterile water.

## 2020-05-24 NOTE — Progress Notes (Signed)
Physical Therapy Wound Treatment Patient Details  Name: Shannon [name] MRN: [id number] Date of Birth: 19-Nov-1945  Today's Date: 05/24/2020 Time: 7353-2992 Time Calculation (min): 49 min  Subjective  Subjective: Pt pleasant and agreeable to hydro Patient and Family Stated Goals: Heal wound Prior Treatments: Dressing changes  Pain Score:  No pain reported throughout session.   Wound Assessment  Pressure Injury 05/23/20 Sacrum Unstageable - Full thickness tissue loss in which the base of the injury is covered by slough (yellow, tan, gray, green or brown) and/or eschar (tan, brown or black) in the wound bed. (Active)  Dressing Type ABD;Barrier Film (skin prep);Gauze (Comment);Moist to dry 05/24/20 1406  Dressing Changed;Clean;Dry;Intact 05/24/20 1406  Dressing Change Frequency Daily 05/24/20 1406  State of Healing Eschar 05/24/20 1406  Site / Wound Assessment Red;Yellow;Black 05/24/20 1406  % Wound base Red or Granulating 30% 05/24/20 1406  % Wound base Yellow/Fibrinous Exudate 60% 05/24/20 1406  % Wound base Black/Eschar 10% 05/24/20 1406  % Wound base Other/Granulation Tissue (Comment) 0% 05/24/20 1406  Peri-wound Assessment Intact;Pink 05/24/20 1406  Wound Length (cm) 11 cm 05/23/20 1200  Wound Width (cm) 9.5 cm 05/23/20 1200  Wound Depth (cm) 0.1 cm 05/23/20 1200  Wound Surface Area (cm^2) 104.5 cm^2 05/23/20 1200  Wound Volume (cm^3) 10.45 cm^3 05/23/20 1200  Margins Unattached edges (unapproximated) 05/24/20 1406  Drainage Amount Minimal 05/24/20 1406  Drainage Description Serosanguineous 05/24/20 1406  Treatment Debridement (Selective);Hydrotherapy (Pulse lavage);Packing (Saline gauze) 05/24/20 1406  Santyl applied to wound bed prior to applying dressing.    Hydrotherapy Pulsed lavage therapy - wound location: Sacrum Pulsed Lavage with Suction (psi): 12 psi Pulsed Lavage with Suction - Normal Saline Used: 1000 mL Pulsed Lavage Tip: Tip with splash shield Selective  Debridement Selective Debridement - Location: Sacrum Selective Debridement - Tools Used: Forceps;Scalpel;Scissors Selective Debridement - Tissue Removed: Black eschar   Wound Assessment and Plan  Wound Therapy - Assess/Plan/Recommendations Wound Therapy - Clinical Statement: Progressing with debridement. Pt tolerated treatment without complaints of pain. Pt transferred to an air mattress after hydro session yesterday, however still awaiting soft touch call bell. Will continue to follow for selective removal of unviable tissue, to decrease bioburden and promote wound bed healing.  Wound Therapy - Functional Problem List: Decreased mobility and global weakness Factors Delaying/Impairing Wound Healing: Immobility Hydrotherapy Plan: Debridement;Dressing change;Patient/family education;Pulsatile lavage with suction Wound Therapy - Frequency: 6X / week Wound Therapy - Follow Up Recommendations: Skilled nursing facility Wound Plan: See above  Wound Therapy Goals- Improve the function of patient's integumentary system by progressing the wound(s) through the phases of wound healing (inflammation - proliferation - remodeling) by: Decrease Necrotic Tissue to: 20% Decrease Necrotic Tissue - Progress: Progressing toward goal Increase Granulation Tissue to: 80% Increase Granulation Tissue - Progress: Progressing toward goal Time For Goal Achievement: 7 days Wound Therapy - Potential for Goals: Fair  Goals will be updated until maximal potential achieved or discharge criteria met.  Discharge criteria: when goals achieved, discharge from hospital, MD decision/surgical intervention, no progress towards goals, refusal/missing three consecutive treatments without notification or medical reason.  GP     Thelma Comp 05/24/2020, 2:20 PM   Rolinda Roan, PT, DPT Acute Rehabilitation Services Pager: [phone] Office: [phone]

## 2020-05-24 NOTE — Progress Notes (Signed)
PROGRESS NOTE                                                                                                                                                                                                             Patient Demographics:    Shannon [name], is a 74 y.o. male, DOB - [birth year], WUJ:[id number]  Outpatient Primary MD for the patient is McGowen, Maryjean Morn, MD    LOS - 3  Admit date - 05/21/2020    Chief Complaint  Patient presents with  . Unresponsive       Brief Narrative (HPI from H&P) - Shannon [name] is a 74 y.o. 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  Patient had anterior approach for the repair.  He then went home and had a fall from his bed, he was then hospitalized and from here he was sent to a nursing home, he has been quadriplegic since then, he has also developed sacral decubitus ulcers.   In the ER this admission he was found to have UTI, dehydration with found hypotension.  A Foley catheter was placed in the ER however Foley did not yield any urine output but frank blood.       Subjective:   Patient was noted to be lying comfortably on the bed.  No complaints offered.  Subsequently called by the RN that the patient was experiencing a lot of pain in his abdomen.  Likely related to CBI.  Asked her to communicate with urology.  Patient reexamined.  More comfortable now.      Assessment  & Plan :     Acute metabolic encephalopathy due to UTI, sepsis and hypotension Head CT unremarkable.  Patient was treated with IV antibiotics.  Mentation has improved.  Waiting on goals of care conversation.  Blood and urine cultures without any growth so far.  Will narrow the antibiotic spectrum.  Hematuria in the setting of indwelling Foley catheter/urethral injury Seen by urology due to persistent  hematuria.  CBI was initiated per urology.  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 Lovell Sheehan.  Subsequent fall at home with progression to quadriplegia.   Based on conversations with  patient and family by previous rounding MD plan is for transition to comfort.  Awaiting palliative care conversation with family and patient regarding goals of care.  Chronic sacral decubitus ulcers POA ound care on board, multiple ulcers unstageable picture as below, general surgery was consulted., no debridement needed, continue wound care..  Essential hypertension Blood pressure medications on hold due to low BP.    Anemia of chronic disease with some evidence of low B12 and borderline iron deficiency as well   Continue B12 supplementation and iron.  Hemoglobin low but stable.  Chronic Eliquis use This was started for DVT prophylaxis at the rehab, do not think he needs that on a chronic basis.  Was placed on heparin here in the hospital which is on hold due to hematuria.   DVT prophylaxis: Currently SCDs. CODE STATUS: Currently full code Family Communication: No family at bedside.  Consults  :  N Surg, CCS  Disposition Plan  : Unclear for now  Status is: Inpatient  Remains inpatient appropriate because:Inpatient level of care appropriate due to severity of illness   Dispo: The patient is from: SNF              Anticipated d/c is to: SNF              Anticipated d/c date is: > 3 days              Patient currently is not medically stable to d/c.    Inpatient Medications  Scheduled Meds: . Chlorhexidine Gluconate Cloth  6 each Topical Daily  . collagenase   Topical Daily  . cyanocobalamin  1,000 mcg Intramuscular Daily  . docusate sodium  100 mg Oral BID  . ferrous sulfate  325 mg Oral BID WC  .  morphine injection  2 mg Intravenous Once  .  pantoprazole  40 mg Oral QHS  . tamsulosin  0.4 mg Oral Daily   Continuous Infusions: . ceFEPime (MAXIPIME) IV 2 g (05/24/20 0556)  . sodium chloride irrigation     PRN Meds:.diphenhydrAMINE, morphine injection, [DISCONTINUED] ondansetron **OR** ondansetron (ZOFRAN) IV, opium-belladonna, Resource ThickenUp Clear  Antibiotics  :    Anti-infectives (From admission, onward)   Start     Dose/Rate Route Frequency Ordered Stop   05/23/20 1400  ceFEPIme (MAXIPIME) 2 g in sodium chloride 0.9 % 100 mL IVPB        2 g 200 mL/hr over 30 Minutes Intravenous Every 8 hours 05/23/20 1011     05/22/20 0500  ceFEPIme (MAXIPIME) 2 g in sodium chloride 0.9 % 100 mL IVPB  Status:  Discontinued        2 g 200 mL/hr over 30 Minutes Intravenous Every 12 hours 05/21/20 1614 05/23/20 1011   05/21/20 2015  aztreonam (AZACTAM) 2 g in sodium chloride 0.9 % 100 mL IVPB        2 g 200 mL/hr over 30 Minutes Intravenous  Once 05/21/20 2010 05/21/20 2230   05/21/20 1730  vancomycin (VANCOREADY) IVPB 1750 mg/350 mL        1,750 mg 175 mL/hr over 120 Minutes Intravenous  Once 05/21/20 1722 05/21/20 2148   05/21/20 1545  ceFEPIme (MAXIPIME) 2 g in sodium chloride 0.9 % 100 mL IVPB        2 g 200 mL/hr over 30 Minutes Intravenous  Once 05/21/20 1535 05/21/20 1704       Osvaldo Shipper M.D on 05/24/2020 at 1:17 PM  To page go to [email or web address]  [facility] -  Office  [phone]        Objective:   Vitals:   05/23/20 2121 05/24/20 0557 05/24/20 1014 05/24/20 1235  BP: 96/60 109/64 128/72 107/71  Pulse: 83 90  98  Resp: Temp: 98.1 F (36.7 C) 98.4 F (36.9 C) 98.1 F (36.7 C) 97.9 F (36.6 C)  TempSrc: Axillary Axillary Oral   SpO2: 96% 96%  99%  Weight:      Height:        Wt Readings from Last 3 Encounters:  05/22/20 120.7 kg  05/03/20 84.7 kg  04/22/20 91 kg     Intake/Output Summary (Last 24 hours) at 05/24/2020 1317 Last data filed at 05/24/2020 0929 Gross per 24  hour  Intake 4402.75 ml  Output 1350 ml  Net 3052.75 ml     Physical Exam  General appearance: Awake alert.  In no distress Resp: Clear to auscultation bilaterally.  Normal effort Cardio: S1-S2 is normal regular.  No S3-S4.  No rubs murmurs or bruit GI: Abdomen is soft.  Nontender nondistended.  Bowel sounds are present normal.  No masses organomegaly Extremities: No edema.   Neurologic: No focal neurological deficits.     Data Review:    CBC Recent Labs  Lab 05/21/20 1533 05/21/20 1541 05/22/20 0507 05/22/20 0816 05/23/20 0432 05/23/20 2306 05/24/20 0127  WBC 12.4*  --  14.1*  --  8.1 10.1 10.6*  HGB 9.4*   < > 8.1* 7.7* 7.4* 8.1* 8.9*  HCT 30.9*   < > 25.9* 25.1* 23.9* 25.6* 27.4*  PLT 187  --  179  --  150 142* 179  MCV 94.2  --  93.5  --  94.1 93.4 92.6  MCH 28.7  --  29.2  --  29.1 29.6 30.1  MCHC 30.4  --  31.3  --  31.0 31.6 32.5  RDW 15.7*  --  15.8*  --  15.9* 15.6* 15.7*  LYMPHSABS 1.1  --   --   --  1.4  --  1.8  MONOABS 1.1*  --   --   --  1.0  --  1.1*  EOSABS 0.0  --   --   --  0.1  --  0.2  BASOSABS 0.0  --   --   --  0.0  --  0.1   < > = values in this interval not displayed.    Recent Labs  Lab 05/21/20 1533 05/21/20 1533 05/21/20 1541 05/21/20 1544 05/21/20 1546 05/21/20 1640 05/21/20 2030 05/22/20 0507 05/23/20 0432 05/24/20 0127  NA 140   < > 143 142  --   --   --  137 141 141  K 4.1   < > 4.0 4.0  --   --   --  3.7 3.3* 3.7  CL 102  --  100  --   --   --   --  103 104 107  CO2 28  --   --   --   --   --   --  GLUCOSE 174*  --  171*  --   --   --   --  124* 84 162*  BUN 25*  --  30*  --   --   --   --  24* 23 22  CREATININE 2.06*  --  2.00*  --   --   --   --  1.50* 1.08 0.99  CALCIUM  8.0*  --   --   --   --   --   --  7.9* 8.1* 8.1*  AST 42*  --   --   --   --   --   --  34 46* 52*  ALT 32  --   --   --   --   --   --  27 35 44  ALKPHOS 83  --   --   --   --   --   --  71 67 74  BILITOT 0.4  --   --   --   --   --   --   0.6 0.8 0.9  ALBUMIN 1.8*  --   --   --   --   --   --  1.8* 1.6* 1.8*  MG  --   --   --   --   --   --   --   --  1.9 1.9  CRP  --   --   --   --   --   --   --  0.7 0.7 0.6  PROCALCITON  --   --   --   --   --   --   --  0.12 0.11 <0.10  LATICACIDVEN 3.4*  --   --   --   --  2.7* 2.2*  --   --   --   INR 1.5*  --   --   --   --   --   --  1.5*  --   --   TSH  --   --   --   --   --  1.839  --   --   --   --   AMMONIA  --   --   --   --  21  --   --   --   --   --   BNP  --   --   --   --   --   --   --   --  35.1 32.0   < > = values in this interval not displayed.    Recent Labs    05/21/20 1640  TSH 1.839     Micro Results Recent Results (from the past 240 hour(s))  Urine culture     Status: None   Collection Time: 05/21/20  3:33 PM   Specimen: In/Out Cath Urine  Result Value Ref Range Status   Specimen Description IN/OUT CATH URINE  Final   Special Requests NONE  Final   Culture   Final    NO GROWTH Performed at [facility], [address], [place], [place] [address]    Report Status 05/22/2020 FINAL  Final  Resp Panel by RT-PCR (Flu A&B, Covid) Nasopharyngeal Swab     Status: None   Collection Time: 05/21/20  3:35 PM   Specimen: Nasopharyngeal Swab; Nasopharyngeal(NP) swabs in vial transport medium  Result Value Ref Range Status   SARS Coronavirus 2 by RT PCR NEGATIVE NEGATIVE Final    Comment: (NOTE) SARS-CoV-2 target nucleic acids are NOT DETECTED.  The SARS-CoV-2 RNA is generally detectable in upper respiratory specimens during the acute phase of infection. The lowest concentration of SARS-CoV-2 viral copies this assay can detect is 138 copies/mL. A negative result does not preclude SARS-Cov-2 infection and should not be used as the sole basis for treatment or other patient management decisions. A  negative result may occur with  improper specimen collection/handling, submission of specimen other than nasopharyngeal swab, presence of viral mutation(s)  within the areas targeted by this assay, and inadequate number of viral copies(<138 copies/mL). A negative result must be combined with clinical observations, patient history, and epidemiological information. The expected result is Negative.  Fact Sheet for Patients:  BloggerCourse.com  Fact Sheet for Healthcare Providers:  SeriousBroker.it  This test is no t yet approved or cleared by the [place] FDA and  has been authorized for detection and/or diagnosis of SARS-CoV-2 by FDA under an Emergency Use Authorization (EUA). This EUA will remain  in effect (meaning this test can be used) for the duration of the COVID-19 declaration under Section 564(b)(1) of the Act, 21 U.S.C.section 360bbb-3(b)(1), unless the authorization is terminated  or revoked sooner.       Influenza A by PCR NEGATIVE NEGATIVE Final   Influenza B by PCR NEGATIVE NEGATIVE Final    Comment: (NOTE) The Xpert Xpress SARS-CoV-2/FLU/RSV plus assay is intended as an aid in the diagnosis of influenza from Nasopharyngeal swab specimens and should not be used as a sole basis for treatment. Nasal washings and aspirates are unacceptable for Xpert Xpress SARS-CoV-2/FLU/RSV testing.  Fact Sheet for Patients: BloggerCourse.com  Fact Sheet for Healthcare Providers: SeriousBroker.it  This test is not yet approved or cleared by the [place] FDA and has been authorized for detection and/or diagnosis of SARS-CoV-2 by FDA under an Emergency Use Authorization (EUA). This EUA will remain in effect (meaning this test can be used) for the duration of the COVID-19 declaration under Section 564(b)(1) of the Act, 21 U.S.C. section 360bbb-3(b)(1), unless the authorization is terminated or revoked.  Performed at [facility], [address] [place], [place] [address]   Blood Culture (routine x 2)     Status: None  (Preliminary result)   Collection Time: 05/21/20  4:40 PM   Specimen: BLOOD  Result Value Ref Range Status   Specimen Description BLOOD SITE NOT SPECIFIED  Final   Special Requests   Final    BOTTLES DRAWN AEROBIC ONLY Blood Culture results may not be optimal due to an inadequate volume of blood received in culture bottles   Culture   Final    NO GROWTH 3 DAYS Performed at [facility], [address], [place], [place] [address]    Report Status PENDING  Incomplete  Blood Culture (routine x 2)     Status: None (Preliminary result)   Collection Time: 05/21/20  4:42 PM   Specimen: BLOOD  Result Value Ref Range Status   Specimen Description BLOOD SITE NOT SPECIFIED  Final   Special Requests   Final    BOTTLES DRAWN AEROBIC ONLY Blood Culture results may not be optimal due to an inadequate volume of blood received in culture bottles   Culture   Final    NO GROWTH 3 DAYS Performed at [facility], [address], [place], [place] [address]    Report Status PENDING  Incomplete    Radiology Reports DG Cervical Spine 1 View  Result Date: 04/26/2020 CLINICAL DATA:  Status post surgical spinal fusion. EXAM: DG CERVICAL SPINE - 1 VIEW COMPARISON:  Radiographs of the cervical spine 04/22/2020. FINDINGS: A single lateral view radiograph of the cervical spine is submitted. Redemonstrated sequela of prior C3-C5 ACDF with ventral plate and screw fixation. Redemonstrated interbody device at the C4-C5 level. IMPRESSION: Single lateral view radiograph of the cervical spine  as above. Electronically Signed   By: Jackey Loge DO   On: 04/26/2020 14:58   CT Head Wo Contrast  Result Date: 05/21/2020 CLINICAL DATA:  Mental status change. EXAM: CT HEAD WITHOUT CONTRAST TECHNIQUE: Contiguous axial images were obtained from the base of the skull through the vertex without intravenous contrast. COMPARISON:  May 03, 2020 FINDINGS: Brain: No evidence of acute infarction, hemorrhage,  hydrocephalus, extra-axial collection or mass lesion/mass effect. Mild brain parenchymal volume loss and deep white matter microangiopathy. Vascular: No hyperdense vessel or unexpected calcification. Skull: Normal. Negative for fracture or focal lesion. Sinuses/Orbits: No acute finding. Other: None. IMPRESSION: 1. No acute intracranial abnormality. 2. 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 Electronically Signed   By: Ted Mcalpine M.D.   On: 05/21/2020 17:53   CT Head Wo Contrast  Result Date: 05/03/2020 CLINICAL DATA:  Facial trauma after multiple falls. EXAM: CT HEAD WITHOUT CONTRAST CT CERVICAL SPINE WITHOUT CONTRAST TECHNIQUE: Multidetector CT imaging of the head and cervical spine was performed following the standard protocol without intravenous contrast. Multiplanar CT image reconstructions of the cervical spine were also generated. COMPARISON:  April 21, 2020. FINDINGS: CT HEAD FINDINGS Brain: Mild chronic ischemic white matter disease is noted. No mass effect or midline shift is noted. Ventricular size is within normal limits. There is no evidence of mass lesion, hemorrhage or acute infarction. Vascular: No hyperdense vessel or unexpected calcification. Skull: Normal. Negative for fracture or focal lesion. Sinuses/Orbits: No acute finding. Other: None. CT CERVICAL SPINE FINDINGS Alignment: Normal. Skull base and vertebrae: No acute fracture is noted. Postsurgical changes are seen involving the C3, C4 and C5 vertebral bodies. Soft tissues and spinal canal: No prevertebral fluid or swelling. No visible canal hematoma. Disc levels: Status post surgical anterior fusion of C3-4 and C4-5. There is fusion of the C5-6 and C6-7 disc spaces secondary to degenerative change. Upper chest: Negative. Other: None. IMPRESSION: 1. Mild chronic ischemic white matter disease. No acute intracranial abnormality seen. 2. Postsurgical and degenerative changes as described above. No acute  abnormality seen in the cervical spine. Electronically Signed   By: Lupita Raider M.D.   On: 05/03/2020 14:41   CT Chest Wo Contrast  Result Date: 05/03/2020 CLINICAL DATA:  Short of breath, fever, sepsis, multiple falls EXAM: CT CHEST WITHOUT CONTRAST TECHNIQUE: Multidetector CT imaging of the chest was performed following the standard protocol without IV contrast. COMPARISON:  05/03/2020 FINDINGS: Cardiovascular: Unenhanced imaging of the heart and great vessels demonstrates trace pericardial fluid. The heart is not enlarged. Aneurysmal dilatation of the ascending thoracic aorta measuring up to 4.4 cm. Evaluation of the vascular lumen is limited without intravenous contrast. Mediastinum/Nodes: No enlarged mediastinal or axillary lymph nodes. Thyroid gland, trachea, and esophagus demonstrate no significant findings. Lungs/Pleura: There is diffuse bronchial wall thickening. Complete opacification of the bronchus intermedius and right middle and right lower lobe bronchi are seen, along with partial opacification of the left lower lobe bronchus. There is patchy airspace disease within the right upper and left lower lobes. Complete consolidation of the right middle and right lower lobes with associated volume loss consistent with atelectasis. No effusion or pneumothorax. Upper Abdomen: Calcified gallstone is identified. The remainder of the upper abdomen is unremarkable. Musculoskeletal: Severe left shoulder osteoarthritis. No acute or destructive bony lesions. Reconstructed images demonstrate no additional findings. IMPRESSION: 1. Diffuse bronchial wall thickening, with patchy airspace disease of the right upper and left lower lobes, consistent with bronchopneumonia. 2. Opacification of the right  middle, right lower, and left lower lobe bronchi, with complete collapse of the right middle and right lower lobes. This may be related to mucous plugging or underlying infection. 3. Trace pericardial fluid. 4.  Cholelithiasis without cholecystitis. 5. 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 Recommend annual imaging followup by CTA or MRA. This recommendation follows 2010 ACCF/AHA/AATS/ACR/ASA/SCA/SCAI/SIR/STS/SVM Guidelines for the Diagnosis and Management of Patients with Thoracic Aortic Disease. Circulation. 2010; 121: Z610-R604: E266-e369. Aortic aneurysm NOS (ICD10-I71.9) Electronically Signed   By: Sharlet SalinaMichael  Brown M.D.   On: 05/03/2020 16:31   CT Cervical Spine Wo Contrast  Result Date: 05/03/2020 CLINICAL DATA:  Facial trauma after multiple falls. EXAM: CT HEAD WITHOUT CONTRAST CT CERVICAL SPINE WITHOUT CONTRAST TECHNIQUE: Multidetector CT imaging of the head and cervical spine was performed following the standard protocol without intravenous contrast. Multiplanar CT image reconstructions of the cervical spine were also generated. COMPARISON:  April 21, 2020. FINDINGS: CT HEAD FINDINGS Brain: Mild chronic ischemic white matter disease is noted. No mass effect or midline shift is noted. Ventricular size is within normal limits. There is no evidence of mass lesion, hemorrhage or acute infarction. Vascular: No hyperdense vessel or unexpected calcification. Skull: Normal. Negative for fracture or focal lesion. Sinuses/Orbits: No acute finding. Other: None. CT CERVICAL SPINE FINDINGS Alignment: Normal. Skull base and vertebrae: No acute fracture is noted. Postsurgical changes are seen involving the C3, C4 and C5 vertebral bodies. Soft tissues and spinal canal: No prevertebral fluid or swelling. No visible canal hematoma. Disc levels: Status post surgical anterior fusion of C3-4 and C4-5. There is fusion of the C5-6 and C6-7 disc spaces secondary to degenerative change. Upper chest: Negative. Other: None. IMPRESSION: 1. Mild chronic ischemic white matter disease. No acute intracranial abnormality seen. 2. Postsurgical and degenerative changes as described above. No acute abnormality seen in the  cervical spine. Electronically Signed   By: Lupita RaiderJames  Green Jr M.D.   On: 05/03/2020 14:41   DG Chest Port 1 View  Result Date: 05/21/2020 CLINICAL DATA:  Found unresponsive. EXAM: PORTABLE CHEST 1 VIEW COMPARISON:  May 08, 2020 FINDINGS: There is stable elevation of the right hemidiaphragm. Mild bilateral infrahilar atelectasis is seen. There is no evidence of a pleural effusion or pneumothorax. The heart size and mediastinal contours are within normal limits. The visualized skeletal structures are unremarkable. IMPRESSION: Mild bilateral infrahilar atelectasis. Electronically Signed   By: Aram Candelahaddeus  Houston M.D.   On: 05/21/2020 16:34   DG Chest Port 1 View  Result Date: 05/08/2020 CLINICAL DATA:  Mucous plugging EXAM: PORTABLE CHEST 1 VIEW COMPARISON:  Two days ago FINDINGS: Low volume chest with opacity at both lung bases. Air bronchograms seen on the left. No edema, effusion, or pneumothorax. Normal heart size IMPRESSION: Continued volume loss with extensive atelectasis at the bases. Electronically Signed   By: Marnee SpringJonathon  Watts M.D.   On: 05/08/2020 06:46   DG Chest Port 1 View  Result Date: 05/06/2020 CLINICAL DATA:  Shortness of breath EXAM: PORTABLE CHEST 1 VIEW COMPARISON:  May 05, 2020 FINDINGS: Persistent right pleural effusion with areas of atelectatic change in the lung bases noted. Heart size and pulmonary vascularity are normal. No adenopathy. No bone lesions. IMPRESSION: Stable right pleural effusion with bibasilar atelectasis. A degree of superimposed infiltrate in the right base cannot be excluded. Stable cardiac silhouette. Electronically Signed   By: Bretta BangWilliam  Woodruff III M.D.   On: 05/06/2020 08:29   DG Chest Port 1 View  Result Date: 05/05/2020 CLINICAL DATA:  Respiratory distress. EXAM: PORTABLE CHEST 1 VIEW COMPARISON:  05/03/2020 FINDINGS: The cardiac silhouette, mediastinal and hilar contours are within normal limits and stable. Moderate-sized right pleural effusion  with overlying atelectasis or infiltrate. Patchy left basilar infiltrate. IMPRESSION: Moderate-sized right pleural effusion with overlying atelectasis or infiltrate. Patchy left basilar infiltrate. Electronically Signed   By: Rudie Meyer M.D.   On: 05/05/2020 07:15   DG CHEST PORT 1 VIEW  Result Date: 05/04/2020 CLINICAL DATA:  Respiratory distress EXAM: PORTABLE CHEST 1 VIEW COMPARISON:  05/03/2020 FINDINGS: Unchanged right basilar consolidation. Cardiomediastinal contours and pleural spaces are normal. Left lung is clear. IMPRESSION: Unchanged right basilar consolidation. Electronically Signed   By: Deatra Robinson M.D.   On: 05/04/2020 00:07   DG Chest Port 1 View  Result Date: 05/03/2020 CLINICAL DATA:  Questionable sepsis. EXAM: PORTABLE CHEST 1 VIEW COMPARISON:  01/09/2020. FINDINGS: The heart size and mediastinal contours are within normal limits. Low lung volumes. Left basilar opacities. No visible pleural effusions or pneumothorax. Elevated right hemidiaphragm. No acute osseous abnormality. Bilateral shoulder degenerative change. Partially imaged cervical ACDF. IMPRESSION: Low lung volumes with left basilar opacities, which may represent atelectasis, aspiration and/or pneumonia. Dedicated PA and lateral radiographs could better characterize if clinically indicated. Electronically Signed   By: Feliberto Harts MD   On: 05/03/2020 13:12   DG Swallowing Func-Speech Pathology  Result Date: 05/04/2020 Objective Swallowing Evaluation: Type of Study: Bedside Swallow Evaluation  Patient Details Name: Shannon [name] MRN: [id number] Date of Birth: 05/28/1946 Today's Date: 05/04/2020 Time: SLP Start Time (ACUTE ONLY): 1044 -SLP Stop Time (ACUTE ONLY): 1103 SLP Time Calculation (min) (ACUTE ONLY): 19 min Past Medical History: Past Medical History: Diagnosis Date . Asthma  . 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  Newell Coral, 1992-->surg at [facility].  Summer 2021->Cervicalgia,arm pain, hand atrophy RUE wkness, hyperreflex (Dr. 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 Hay fever  . Hypertension  . Osteoarthritis of both hands  Past Surgical History: Past Surgical History: Procedure Laterality Date . ANTERIOR CERVICAL DECOMP/DISCECTOMY FUSION N/A 04/05/2020  Procedure: ANTERIOR CERVICAL DECOMPRESSION/DISCECTOMY FUSION, INTERBODY PROSTHESIS, PLATE/SCREWS CERVICAL THREE-CERVICAL FOUR, CERVICAL FOUR- CERVICAL FIVE;  Surgeon: Tressie Stalker, MD;  Location: [facility] OR;  Service: Neurosurgery;  Laterality: N/A; . ANTERIOR CERVICAL DECOMP/DISCECTOMY FUSION N/A 04/22/2020  Procedure: Reexploration of anterior cervical wound for epidural hematoma;  Surgeon: Donalee Citrin, MD;  Location: [facility] OR;  Service: Neurosurgery;  Laterality: N/A; . BACK SURGERY   . INCISION AND DRAINAGE Left 2012  L hand infection . ROTATOR CUFF REPAIR Right   x 3 . SPINE SURGERY   HPI: 74 year old white male who presented with right lower lobe pneumonia and known history of  Quadriparesis.  He originally had a C3-4 [place] 4-5 anterior cervical disc asked to me with decompression, C3 3-4 and C4-5 interbody arthrodesis with local autograft bone, anterior cervical plating of C3-C5 on 04/05/2020.  He then went to rehab and was discharged.  On 04/21/2020 the patient presented to the emergency room with progressive bilateral quadriplegia.  Patient went to the operating room and had decompression of a vertebral hematoma.  And been discharged on 11 10/2019 and readmitted on 05/03/2020 for increasing shortness of breath and productive cough.  Chest x-ray demonstrated a new right lower lobe infiltrate with compressive atelectasis, concerning for aspiration pneumonia.  Pt had MBSS during most recent admission on 04/25/2020 with recommendations for  puree and thin liquids, advanced to Dys3 prior to discharge  Subjective: alert,  cooperative, pleasant, participative. Assessment / Plan / Recommendation CHL IP CLINICAL IMPRESSIONS 05/04/2020 Clinical Impression Pt presents with moderate oropharyngeal dysphagia c/b decreased base of tongue retraction, delayed swallow inititation, incomplete laryngeal closure, reduced pharyngeal perstalsis, decreased UES opening, and diminished sensation. These deficits resulted in silent aspiration of thin liquid and nectar thick liquid by straw prior to the swallow. With nectar thick liquid by cup, only transient penetration was seen. There was significant pharyngeal residue with puree and solid consistencies which was reduced but not fully cleared with liquid wash. There is noticeable prevertebral edema in pharynx which appears to be impacting swallow function, but pt is able to acheive adequate epiglottic deflection. This study represents a marked decline in swallow function from MBSS on 04/25/20; however, from review of images in both studies, amount of edema appears comparable to naked eye without any accurate measurement, or comparing level of magnification during each study. Because of recent ACDF (11/13) and revision (10/29), and presence of cervical collar, SLP intervention options are limited outside of modification of diet. Pt cannot trial compensatory strategies requiring change to head/neck positioning, and many swallow exercises cannot be executed in this position. Pt may benefit from some tongue strengthening exercises to improve swallow drive and decrease vallecula residue. Recommend initiating full liquid diet with nectar thick liquids, by cup sip only. NO STRAWS. Please crush medications if permissible. Called neice, [name], and shared results of today's study which she will pass on to pt's daughter who will be coming to town tomorrow. SLP Visit Diagnosis -- Attention and concentration deficit following -- Frontal lobe and executive function deficit following -- Impact on safety and function --    CHL IP TREATMENT RECOMMENDATION 05/04/2020 Treatment Recommendations Therapy as outlined in treatment plan below   Prognosis 05/04/2020 Prognosis for Safe Diet Advancement Fair Barriers to Reach Goals Cognitive deficits Barriers/Prognosis Comment -- CHL IP DIET RECOMMENDATION 05/04/2020 SLP Diet Recommendations Nectar thick liquid Liquid Administration via No straw Medication Administration -- Compensations Slow rate;Small sips/bites;Follow solids with liquid Postural Changes Seated upright at 90 degrees;Remain semi-upright after after feeds/meals (Comment)   CHL IP OTHER RECOMMENDATIONS 05/04/2020 Recommended Consults -- Oral Care Recommendations Oral care BID Other Recommendations --   CHL IP FOLLOW UP RECOMMENDATIONS 05/04/2020 Follow up Recommendations (No Data)   CHL IP FREQUENCY AND DURATION 05/04/2020 Speech Therapy Frequency (ACUTE ONLY) min 2x/week Treatment Duration 2 weeks      CHL IP ORAL PHASE 05/04/2020 Oral Phase -- Oral - Pudding Teaspoon -- Oral - Pudding Cup -- Oral - Honey Teaspoon -- Oral - Honey Cup -- Oral - Nectar Teaspoon -- Oral - Nectar Cup WFL Oral - Nectar Straw Premature spillage;Decreased bolus cohesion Oral - Thin Teaspoon -- Oral - Thin Cup Premature spillage Oral - Thin Straw Premature spillage Oral - Puree WFL Oral - Mech Soft WFL Oral - Regular -- Oral - Multi-Consistency -- Oral - Pill WFL Oral Phase - Comment --  CHL IP PHARYNGEAL PHASE 05/04/2020 Pharyngeal Phase Impaired Pharyngeal- Pudding Teaspoon -- Pharyngeal -- Pharyngeal- Pudding Cup -- Pharyngeal -- Pharyngeal- Honey Teaspoon -- Pharyngeal -- Pharyngeal- Honey Cup -- Pharyngeal -- Pharyngeal- Nectar Teaspoon -- Pharyngeal -- Pharyngeal- Nectar Cup -- Pharyngeal -- Pharyngeal- Nectar Straw -- Pharyngeal -- Pharyngeal- Thin Teaspoon -- Pharyngeal -- Pharyngeal- Thin Cup -- Pharyngeal -- Pharyngeal- Thin Straw -- Pharyngeal -- Pharyngeal- Puree -- Pharyngeal -- Pharyngeal- Mechanical Soft -- Pharyngeal -- Pharyngeal-  Regular --  Pharyngeal -- Pharyngeal- Multi-consistency -- Pharyngeal -- Pharyngeal- Pill -- Pharyngeal -- Pharyngeal Comment --  CHL IP CERVICAL ESOPHAGEAL PHASE 05/04/2020 Cervical Esophageal Phase Impaired Pudding Teaspoon -- Pudding Cup -- Honey Teaspoon -- Honey Cup -- Nectar Teaspoon -- Nectar Cup Reduced cricopharyngeal relaxation Nectar Straw Reduced cricopharyngeal relaxation Thin Teaspoon -- Thin Cup Reduced cricopharyngeal relaxation Thin Straw Reduced cricopharyngeal relaxation Puree Reduced cricopharyngeal relaxation Mechanical Soft Reduced cricopharyngeal relaxation Regular -- Multi-consistency -- Pill Reduced cricopharyngeal relaxation Cervical Esophageal Comment trace esophageal retention of contrast Kerrie Pleasure, MA, CCC-SLP Acute Rehabilitation Services Office: [phone] 05/04/2020, 11:59 AM              DG Swallowing Func-Speech Pathology  Result Date: 04/25/2020 Objective Swallowing Evaluation: Type of Study: MBS-Modified Barium Swallow Study  Patient Details Name: JIMMEY HENGEL MRN: [id number] Date of Birth: 05/08/1946 Today's Date: 04/25/2020 Time: SLP Start Time (ACUTE ONLY): 1043 -SLP Stop Time (ACUTE ONLY): 1057 SLP Time Calculation (min) (ACUTE ONLY): 14 min Past Medical History: Past Medical History: Diagnosis Date . Asthma  . 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 Newell Coral, 1992-->surg at [facility].  Summer 2021->Cervicalgia,arm pain, hand atrophy RUE wkness, hyperreflex (Dr. 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 Hay fever  . Hypertension  . Osteoarthritis of both hands  Past Surgical History: Past Surgical History: Procedure Laterality Date . ANTERIOR CERVICAL DECOMP/DISCECTOMY FUSION N/A 04/05/2020  Procedure: ANTERIOR CERVICAL DECOMPRESSION/DISCECTOMY FUSION, INTERBODY PROSTHESIS, PLATE/SCREWS CERVICAL THREE-CERVICAL FOUR, CERVICAL FOUR-  CERVICAL FIVE;  Surgeon: Tressie Stalker, MD;  Location: [facility] OR;  Service: Neurosurgery;  Laterality: N/A; . ANTERIOR CERVICAL DECOMP/DISCECTOMY FUSION N/A 04/22/2020  Procedure: Reexploration of anterior cervical wound for epidural hematoma;  Surgeon: Donalee Citrin, MD;  Location: [facility] OR;  Service: Neurosurgery;  Laterality: N/A; . BACK SURGERY   . INCISION AND DRAINAGE Left 2012  L hand infection . ROTATOR CUFF REPAIR Right   x 3 . SPINE SURGERY   HPI: Pt is a 74 y.o. 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 Pt s/p evacuation of hemoatoma, removal of hardware and re-implantation of titanium cage packed with autograft bone on 04/22/20.  Pt in hard collar post op. CT head 10/29 negative for acute changes.  Subjective: alert, cooperative Assessment / Plan / Recommendation CHL IP CLINICAL IMPRESSIONS 04/25/2020 Clinical Impression Pt presents with a mild oropharyngeal swallow but with no aspiration or penetration observed. He has reduced posterior propulsion orally with mildly slow mastication and lingual rocking with solids. Lingual residue c/b a mild coating on the tongue is also noted with solids. His base of tongue retraction is mildly reduced, leading to mild vallecular residue with solids. His UES is opening also appears to be mildly reduced, which could be impacted by location of cervical hardware. Recommend continuing current diet with SLP f/u for advancement to more solid textures, which can be done clinically.  SLP Visit Diagnosis Dysphagia, oropharyngeal phase (R13.12) Attention and concentration deficit following -- Frontal lobe and executive function deficit following -- Impact on safety and function Mild aspiration risk   CHL IP TREATMENT  RECOMMENDATION 04/25/2020 Treatment Recommendations Therapy as outlined in treatment plan below   Prognosis 04/25/2020 Prognosis  for Safe Diet Advancement Good Barriers to Reach Goals Cognitive deficits Barriers/Prognosis Comment -- CHL IP DIET RECOMMENDATION 04/25/2020 SLP Diet Recommendations Dysphagia 1 (Puree) solids;Thin liquid Liquid Administration via Cup;Straw Medication Administration Whole meds with puree Compensations Slow rate;Small sips/bites Postural Changes Seated upright at 90 degrees   CHL IP OTHER RECOMMENDATIONS 04/25/2020 Recommended Consults -- Oral Care Recommendations Oral care BID Other Recommendations --   CHL IP FOLLOW UP RECOMMENDATIONS 04/25/2020 Follow up Recommendations Skilled Nursing facility   [facility] IP FREQUENCY AND DURATION 04/25/2020 Speech Therapy Frequency (ACUTE ONLY) min 2x/week Treatment Duration 2 weeks      CHL IP ORAL PHASE 04/25/2020 Oral Phase Impaired Oral - Pudding Teaspoon -- Oral - Pudding Cup -- Oral - Honey Teaspoon -- Oral - Honey Cup -- Oral - Nectar Teaspoon -- Oral - Nectar Cup -- Oral - Nectar Straw -- Oral - Thin Teaspoon -- Oral - Thin Cup Reduced posterior propulsion Oral - Thin Straw Reduced posterior propulsion Oral - Puree Reduced posterior propulsion;Delayed oral transit;Lingual/palatal residue Oral - Mech Soft Reduced posterior propulsion;Delayed oral transit;Lingual/palatal residue;Impaired mastication Oral - Regular -- Oral - Multi-Consistency -- Oral - Pill Reduced posterior propulsion Oral Phase - Comment --  CHL IP PHARYNGEAL PHASE 04/25/2020 Pharyngeal Phase Impaired Pharyngeal- Pudding Teaspoon -- Pharyngeal -- Pharyngeal- Pudding Cup -- Pharyngeal -- Pharyngeal- Honey Teaspoon -- Pharyngeal -- Pharyngeal- Honey Cup -- Pharyngeal -- Pharyngeal- Nectar Teaspoon -- Pharyngeal -- Pharyngeal- Nectar Cup -- Pharyngeal -- Pharyngeal- Nectar Straw -- Pharyngeal -- Pharyngeal- Thin Teaspoon -- Pharyngeal -- Pharyngeal- Thin Cup Reduced tongue base  retraction;Pharyngeal residue - valleculae Pharyngeal -- Pharyngeal- Thin Straw Reduced tongue base retraction;Pharyngeal residue - valleculae Pharyngeal -- Pharyngeal- Puree Reduced tongue base retraction;Pharyngeal residue - valleculae Pharyngeal -- Pharyngeal- Mechanical Soft Reduced tongue base retraction;Pharyngeal residue - valleculae Pharyngeal -- Pharyngeal- Regular -- Pharyngeal -- Pharyngeal- Multi-consistency -- Pharyngeal -- Pharyngeal- Pill Reduced tongue base retraction;Pharyngeal residue - valleculae Pharyngeal -- Pharyngeal Comment --  CHL IP CERVICAL ESOPHAGEAL PHASE 04/25/2020 Cervical Esophageal Phase Impaired Pudding Teaspoon -- Pudding Cup -- Honey Teaspoon -- Honey Cup -- Nectar Teaspoon -- Nectar Cup -- Nectar Straw -- Thin Teaspoon -- Thin Cup Reduced cricopharyngeal relaxation Thin Straw Reduced cricopharyngeal relaxation Puree Reduced cricopharyngeal relaxation Mechanical Soft Reduced cricopharyngeal relaxation Regular -- Multi-consistency -- Pill Reduced cricopharyngeal relaxation Cervical Esophageal Comment -- Mahala Menghini., M.A. CCC-SLP Acute Rehabilitation Services Pager [phone] Office [phone] 04/25/2020, 4:13 PM

## 2020-05-24 NOTE — Progress Notes (Signed)
Urology Inpatient Progress Report  Sepsis secondary to UTI (HCC) [A41.9, N39.0] Sepsis with encephalopathy and septic shock, due to unspecified organism (HCC) [A41.9, R65.21, G93.40]  Intv/Subj: Irrigated foley yesterday PM, but continued to have clots overnight 66F 3-way placed this AM - irrigated clear of clots.  Principal Problem:   Sepsis secondary to UTI [facility]) Active Problems:   Benign essential HTN   Quadriplegia and quadriparesis (HCC)   Essential hypertension   Myelopathy (HCC)   Chronic indwelling Foley catheter   Pressure injury of skin  Current Facility-Administered Medications  Medication Dose Route Frequency Provider Last Rate Last Admin  . ceFEPIme (MAXIPIME) 2 g in sodium chloride 0.9 % 100 mL IVPB  2 g Intravenous Q8H Daylene Posey, RPH 200 mL/hr at 05/24/20 0556 2 g at 05/24/20 0556  . Chlorhexidine Gluconate Cloth 2 % PADS 6 each  6 each Topical Daily Leroy Sea, MD   6 each at 05/23/20 1519  . collagenase (SANTYL) ointment   Topical Daily Leroy Sea, MD   Given at 05/23/20 1817  . cyanocobalamin ((VITAMIN B-12)) injection 1,000 mcg  1,000 mcg Intramuscular Daily Leroy Sea, MD   1,000 mcg at 05/23/20 1051  . diphenhydrAMINE (BENADRYL) injection 25 mg  25 mg Intravenous Q6H PRN Leroy Sea, MD   25 mg at 05/23/20 1756  . docusate sodium (COLACE) capsule 100 mg  100 mg Oral BID Leroy Sea, MD   100 mg at 05/23/20 2100  . ferrous sulfate tablet 325 mg  325 mg Oral BID WC Leroy Sea, MD   325 mg at 05/23/20 1544  . morphine 2 MG/ML injection 2 mg  2 mg Intravenous Q4H PRN Leroy Sea, MD   2 mg at 05/24/20 8299  . ondansetron (ZOFRAN) injection 4 mg  4 mg Intravenous Q6H PRN Earlie Lou L, MD      . pantoprazole (PROTONIX) EC tablet 40 mg  40 mg Oral QHS Leroy Sea, MD   40 mg at 05/23/20 2100  . Resource ThickenUp Clear   Oral PRN Leroy Sea, MD      . sodium chloride irrigation 0.9 % 3,000 mL  3,000 mL  Irrigation Continuous Leroy Sea, MD   3,000 mL at 05/24/20 0713  . tamsulosin (FLOMAX) capsule 0.4 mg  0.4 mg Oral Daily Susa Raring K, MD   0.4 mg at 05/23/20 1019     Objective: Vital: Vitals:   05/23/20 1730 05/23/20 1800 05/23/20 2121 05/24/20 0557  BP: 94/61 105/73 96/60 109/64  Pulse: 80 66 83 90  Resp: 16 18 16 18   Temp: 98.3 F (36.8 C) 97.9 F (36.6 C) 98.1 F (36.7 C) 98.4 F (36.9 C)  TempSrc: Oral Oral Axillary Axillary  SpO2: 93% 98% 96% 96%  Weight:      Height:       I/Os: I/O last 3 completed shifts: In: 1082.8 [P.O.:360; I.V.:1.5; Blood:421.3; IV Piggyback:300] Out: 800 [Urine:800]  Physical Exam:  General: Patient is in no apparent distress Lungs: Normal respiratory effort, chest expands symmetrically. AbThe abdomen is soft and nontender without mass. Foley: 66F 3way on moderate CBI gtt  Ext: lower extremities symmetric  Lab Results: Recent Labs    05/23/20 0432 05/23/20 2306 05/24/20 0127  WBC 8.1 10.1 10.6*  HGB 7.4* 8.1* 8.9*  HCT 23.9* 25.6* 27.4*   Recent Labs    05/22/20 0507 05/23/20 0432 05/24/20 0127  NA 137 141 141  K 3.7 3.3* 3.7  CL 103 104 107  CO2 25 26 25   GLUCOSE 124* 84 162*  BUN 24* 23 22  CREATININE 1.50* 1.08 0.99  CALCIUM 7.9* 8.1* 8.1*   Recent Labs    05/21/20 1533 05/22/20 0507  INR 1.5* 1.5*   No results for input(s): LABURIN in the last 72 hours. Results for orders placed or performed during the hospital encounter of 05/21/20  Urine culture     Status: None   Collection Time: 05/21/20  3:33 PM   Specimen: In/Out Cath Urine  Result Value Ref Range Status   Specimen Description IN/OUT CATH URINE  Final   Special Requests NONE  Final   Culture   Final    NO GROWTH Performed at [facility], [address] [place], [place]    Report Status 05/22/2020 FINAL  Final  Resp Panel by RT-PCR (Flu A&B, Covid) Nasopharyngeal Swab     Status: None   Collection Time: 05/21/20  3:35  PM   Specimen: Nasopharyngeal Swab; Nasopharyngeal(NP) swabs in vial transport medium  Result Value Ref Range Status   SARS Coronavirus 2 by RT PCR NEGATIVE NEGATIVE Final    Comment: (NOTE) SARS-CoV-2 target nucleic acids are NOT DETECTED.  The SARS-CoV-2 RNA is generally detectable in upper respiratory specimens during the acute phase of infection. The lowest concentration of SARS-CoV-2 viral copies this assay can detect is 138 copies/mL. A negative result does not preclude SARS-Cov-2 infection and should not be used as the sole basis for treatment or other patient management decisions. A negative result may occur with  improper specimen collection/handling, submission of specimen other than nasopharyngeal swab, presence of viral mutation(s) within the areas targeted by this assay, and inadequate number of viral copies(<138 copies/mL). A negative result must be combined with clinical observations, patient history, and epidemiological information. The expected result is Negative.  Fact Sheet for Patients:  05/23/20  Fact Sheet for Healthcare Providers:  BloggerCourse.com  This test is no t yet approved or cleared by the [employer]ious[employer].[place] FDA and  has been authorized for detection and/or diagnosis of SARS-CoV-2 by FDA under an Emergency Use Authorization (EUA). This EUA will remain  in effect (meaning this test can be used) for the duration of the COVID-19 declaration under Section 564(b)(1) of the Act, 21 U.S.C.section 360bbb-3(b)(1), unless the authorization is terminated  or revoked sooner.       Influenza A by PCR NEGATIVE NEGATIVE Final   Influenza B by PCR NEGATIVE NEGATIVE Final    Comment: (NOTE) The Xpert Xpress SARS-CoV-2/FLU/RSV plus assay is intended as an aid in the diagnosis of influenza from Nasopharyngeal swab specimens and should not be used as a sole basis for treatment. Nasal washings and aspirates are  unacceptable for Xpert Xpress SARS-CoV-2/FLU/RSV testing.  Fact Sheet for Patients: [place]  Fact Sheet for Healthcare Providers: BloggerCourse.com  This test is not yet approved or cleared by the [employer]iousBroker.it FDA and has been authorized for detection and/or diagnosis of SARS-CoV-2 by FDA under an Emergency Use Authorization (EUA). This EUA will remain in effect (meaning this test can be used) for the duration of the COVID-19 declaration under Section 564(b)(1) of the Act, 21 U.S.C. section 360bbb-3(b)(1), unless the authorization is terminated or revoked.  Performed at [facility], [address] [place], [place]   Blood Culture (routine x 2)     Status: None (Preliminary result)   Collection Time: 05/21/20  4:40 PM   Specimen: BLOOD  Result Value Ref Range Status   Specimen Description BLOOD SITE NOT SPECIFIED  Final   Special Requests   Final    BOTTLES DRAWN AEROBIC ONLY Blood Culture results may not be optimal due to an inadequate volume of blood received in culture bottles   Culture   Final    NO GROWTH 2 DAYS Performed at [facility], [address] [place], [place] [address]    Report Status PENDING  Incomplete  Blood Culture (routine x 2)     Status: None (Preliminary result)   Collection Time: 05/21/20  4:42 PM   Specimen: BLOOD  Result Value Ref Range Status   Specimen Description BLOOD SITE NOT SPECIFIED  Final   Special Requests   Final    BOTTLES DRAWN AEROBIC ONLY Blood Culture results may not be optimal due to an inadequate volume of blood received in culture bottles   Culture   Final    NO GROWTH 2 DAYS Performed at [facility], [address] [place], [place] [address]    Report Status PENDING  Incomplete    Studies/Results: No results found.  Assessment: Traumatic foley - resulting in hematuria.  Now with large catheter and on CBI.  Plan: Wean CBI to off  as able.  Can cap in-flow port once his hematuria resolves.  Exchange the catheter as scheduled by SNF. Will check back.    Berniece Salines, MD Urology 05/24/2020, 8:00 AM

## 2020-05-25 DIAGNOSIS — G8250 Quadriplegia, unspecified: Secondary | ICD-10-CM | POA: Diagnosis not present

## 2020-05-25 DIAGNOSIS — I10 Essential (primary) hypertension: Secondary | ICD-10-CM | POA: Diagnosis not present

## 2020-05-25 DIAGNOSIS — A419 Sepsis, unspecified organism: Secondary | ICD-10-CM | POA: Diagnosis not present

## 2020-05-25 DIAGNOSIS — N390 Urinary tract infection, site not specified: Secondary | ICD-10-CM | POA: Diagnosis not present

## 2020-05-25 LAB — CBC WITH DIFFERENTIAL/PLATELET
Abs Immature Granulocytes: 0.63 10*3/uL — ABNORMAL HIGH (ref 0.00–0.07)
Basophils Absolute: 0.1 10*3/uL (ref 0.0–0.1)
Basophils Relative: 1 %
Eosinophils Absolute: 0 10*3/uL (ref 0.0–0.5)
Eosinophils Relative: 0 %
HCT: 25.5 % — ABNORMAL LOW (ref 39.0–52.0)
Hemoglobin: 8.4 g/dL — ABNORMAL LOW (ref 13.0–17.0)
Immature Granulocytes: 4 %
Lymphocytes Relative: 9 %
Lymphs Abs: 1.4 10*3/uL (ref 0.7–4.0)
MCH: 30.4 pg (ref 26.0–34.0)
MCHC: 32.9 g/dL (ref 30.0–36.0)
MCV: 92.4 fL (ref 80.0–100.0)
Monocytes Absolute: 1.5 10*3/uL — ABNORMAL HIGH (ref 0.1–1.0)
Monocytes Relative: 9 %
Neutro Abs: 12.5 10*3/uL — ABNORMAL HIGH (ref 1.7–7.7)
Neutrophils Relative %: 77 %
Platelets: 175 10*3/uL (ref 150–400)
RBC: 2.76 MIL/uL — ABNORMAL LOW (ref 4.22–5.81)
RDW: 16.2 % — ABNORMAL HIGH (ref 11.5–15.5)
WBC: 16.1 10*3/uL — ABNORMAL HIGH (ref 4.0–10.5)
nRBC: 0 % (ref 0.0–0.2)

## 2020-05-25 LAB — COMPREHENSIVE METABOLIC PANEL
ALT: 37 U/L (ref 0–44)
AST: 36 U/L (ref 15–41)
Albumin: 1.9 g/dL — ABNORMAL LOW (ref 3.5–5.0)
Alkaline Phosphatase: 88 U/L (ref 38–126)
Anion gap: 11 (ref 5–15)
BUN: 19 mg/dL (ref 8–23)
CO2: 24 mmol/L (ref 22–32)
Calcium: 8.1 mg/dL — ABNORMAL LOW (ref 8.9–10.3)
Chloride: 107 mmol/L (ref 98–111)
Creatinine, Ser: 1.33 mg/dL — ABNORMAL HIGH (ref 0.61–1.24)
GFR, Estimated: 56 mL/min — ABNORMAL LOW (ref >60)
Glucose, Bld: 148 mg/dL — ABNORMAL HIGH (ref 70–99)
Potassium: 3.9 mmol/L (ref 3.5–5.1)
Sodium: 142 mmol/L (ref 135–145)
Total Bilirubin: 0.9 mg/dL (ref 0.3–1.2)
Total Protein: 5.4 g/dL — ABNORMAL LOW (ref 6.5–8.1)

## 2020-05-25 LAB — HEMOGLOBIN AND HEMATOCRIT, BLOOD
HCT: 22.7 % — ABNORMAL LOW (ref 39.0–52.0)
Hemoglobin: 7.7 g/dL — ABNORMAL LOW (ref 13.0–17.0)

## 2020-05-25 MED ORDER — OXYBUTYNIN CHLORIDE ER 5 MG PO TB24
5.0000 mg | ORAL_TABLET | Freq: Every day | ORAL | Status: DC
  Administered 2020-05-25 – 2020-06-11 (×19): 5 mg via ORAL
  Filled 2020-05-25 (×20): qty 1

## 2020-05-25 MED ORDER — POTASSIUM CHLORIDE CRYS ER 20 MEQ PO TBCR
20.0000 meq | EXTENDED_RELEASE_TABLET | Freq: Once | ORAL | Status: AC
  Administered 2020-05-25: 20 meq via ORAL
  Filled 2020-05-25: qty 1

## 2020-05-25 MED ORDER — CYCLOBENZAPRINE HCL 10 MG PO TABS
5.0000 mg | ORAL_TABLET | Freq: Once | ORAL | Status: AC
  Administered 2020-05-25: 5 mg via ORAL
  Filled 2020-05-25: qty 1

## 2020-05-25 NOTE — Progress Notes (Signed)
Patient has no complaints of pain at this time. Spasms have ceased for the time being.

## 2020-05-25 NOTE — Progress Notes (Signed)
Called on call urologist Newsome left message for provider to contact back in regards to CBI.

## 2020-05-25 NOTE — Progress Notes (Signed)
Spoke with patients daughter, she plans to be at the hospital around 2-2:30pm for the palliative meeting.

## 2020-05-25 NOTE — Progress Notes (Signed)
Physical Therapy Wound Treatment Patient Details  Name: Shannon [name] MRN: [id number] Date of Birth: [birth year]  Today's Date: 05/25/2020 Time: 9326-7124 Time Calculation (min): 38 min  Subjective  Subjective: Pt pleasant and agreeable to hydro Patient and Family Stated Goals: Heal wound Prior Treatments: Dressing changes  Pain Score:  Pt did not complain of pain throughout session.   Wound Assessment  Pressure Injury 05/23/20 Sacrum Unstageable - Full thickness tissue loss in which the base of the injury is covered by slough (yellow, tan, gray, green or brown) and/or eschar (tan, brown or black) in the wound bed. (Active)  Wound Image   05/25/20 1425  Dressing Type ABD;Barrier Film (skin prep);Gauze (Comment);Moist to dry 05/25/20 1425  Dressing Changed;Clean;Dry;Intact 05/25/20 1425  Dressing Change Frequency Daily 05/25/20 1425  State of Healing Eschar 05/25/20 1425  Site / Wound Assessment Pink;Yellow;Black;Brown 05/25/20 1425  % Wound base Red or Granulating 30% 05/25/20 1425  % Wound base Yellow/Fibrinous Exudate 60% 05/25/20 1425  % Wound base Black/Eschar 10% 05/25/20 1425  % Wound base Other/Granulation Tissue (Comment) 0% 05/25/20 1425  Peri-wound Assessment Intact;Pink 05/25/20 1425  Wound Length (cm) 11 cm 05/25/20 1425  Wound Width (cm) 9.5 cm 05/25/20 1425  Wound Depth (cm) 2.5 cm 05/25/20 1425  Wound Surface Area (cm^2) 104.5 cm^2 05/25/20 1425  Wound Volume (cm^3) 261.25 cm^3 05/25/20 1425  Margins Unattached edges (unapproximated) 05/25/20 1425  Drainage Amount Minimal 05/25/20 1425  Drainage Description Serosanguineous 05/25/20 1425  Treatment Debridement (Selective);Hydrotherapy (Pulse lavage);Packing (Saline gauze) 05/25/20 1425  Santyl applied to wound bed prior to applying dressing.    Hydrotherapy Pulsed lavage therapy - wound location: Sacrum Pulsed Lavage with Suction (psi): 12 psi Pulsed Lavage with Suction - Normal Saline Used: 1000 mL Pulsed  Lavage Tip: Tip with splash shield Selective Debridement Selective Debridement - Location: Sacrum Selective Debridement - Tools Used: Forceps;Scissors Selective Debridement - Tissue Removed: Black eschar   Wound Assessment and Plan  Wound Therapy - Assess/Plan/Recommendations Wound Therapy - Clinical Statement: Progressing with debridement and approaching bone at the right lateral aspect of wound. Will continue to follow for selective removal of unviable tissue, to decrease bioburden, and promote wound bed healing. Wound Therapy - Functional Problem List: Decreased mobility and global weakness Factors Delaying/Impairing Wound Healing: Immobility Hydrotherapy Plan: Debridement;Dressing change;Patient/family education;Pulsatile lavage with suction Wound Therapy - Frequency: 6X / week Wound Therapy - Follow Up Recommendations: Skilled nursing facility Wound Plan: See above  Wound Therapy Goals- Improve the function of patient's integumentary system by progressing the wound(s) through the phases of wound healing (inflammation - proliferation - remodeling) by: Decrease Necrotic Tissue to: 20% Decrease Necrotic Tissue - Progress: Progressing toward goal Increase Granulation Tissue to: 80% Increase Granulation Tissue - Progress: Progressing toward goal Time For Goal Achievement: 7 days Wound Therapy - Potential for Goals: Fair  Goals will be updated until maximal potential achieved or discharge criteria met.  Discharge criteria: when goals achieved, discharge from hospital, MD decision/surgical intervention, no progress towards goals, refusal/missing three consecutive treatments without notification or medical reason.  GP     Thelma Comp 05/25/2020, 2:33 PM   Rolinda Roan, PT, DPT Acute Rehabilitation Services Pager: [phone] Office: [phone]

## 2020-05-25 NOTE — Progress Notes (Signed)
PT Cancellation Note  Patient Details Name: Shannon [name] MRN: [id number] DOB: 1945-10-15   Cancelled Treatment:      Noted the plan for Goals of Care meeting this afternoon. Will continue to follow and initiate PT mobility evaluation when goals of care are established, or pt is able/willing to participate in PT eval..   Marylynn Pearson 05/25/2020, 2:36 PM  Conni Slipper, PT, DPT Acute Rehabilitation Services Pager: [phone] Office: [phone]

## 2020-05-25 NOTE — Progress Notes (Signed)
Pt HR 140's, complaining of pain in abdomen (bladder) and spasms. Morphine and suppository have already been administered. Patient remains in pain. Paged urology, waiting on response. Messaged hospitalist waiting on response.

## 2020-05-25 NOTE — Progress Notes (Signed)
Spoke with patients daughter, she plans to be at the hospital tomorrow 05/26/20 around 2-2:30 for palliative meeting.

## 2020-05-25 NOTE — Progress Notes (Signed)
PROGRESS NOTE                                                                                                                                                                                                             Patient Demographics:    Demaris Leavell, is a 74 y.o. male, DOB - 11-Oct-1945, RUE:[id number]  Outpatient Primary MD for the patient is McGowen, Maryjean Morn, MD    LOS - 4  Admit date - 05/21/2020    Chief Complaint  Patient presents with  . Unresponsive       Brief Narrative (HPI from H&P) - Calel Pisarski is a 74 y.o. 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  Patient had anterior approach for the repair.  He then went home and had a fall from his bed, he was then hospitalized and from here he was sent to a nursing home, he has been quadriplegic since then, he has also developed sacral decubitus ulcers.   In the ER this admission he was found to have UTI, dehydration with found hypotension.  A Foley catheter was placed in the ER however Foley did not yield any urine output but frank blood.       Subjective:   Patient noted to be lying comfortably on the bed.  He denies any abdominal pain this morning.  He did have some pain issues overnight.  Denies any chest pain or shortness of breath     Assessment  & Plan :     Acute metabolic encephalopathy due to UTI, sepsis and hypotension Head CT was unremarkable.  Patient was treated with IV antibiotics.  Mentation has improved.   Blood and urine cultures without any growth so far.  Patient was changed over to ceftriaxone. Waiting on goals of care conversation.   Hematuria in the setting of indwelling Foley catheter/urethral injury Seen by urology due to persistent hematuria.  CBI was initiated.  Management per urology.  Patient experiencing bladder spasms.  He  is getting B&O suppositories as needed.  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 Lovell Sheehan.  Subsequent fall at home with progression to quadriplegia.   Based on conversations with  patient and family by previous rounding MD plan is for transition to comfort.  Awaiting palliative care conversation with family and patient regarding goals of care.  Chronic sacral decubitus ulcers POA Wound care on board. Multiple ulcers unstageable.General surgery was consulted., no debridement needed, continue wound care.  Essential hypertension Blood pressure medications on hold due to low BP.  Blood pressure is relatively stable.  Anemia of chronic disease with some evidence of low B12 and borderline iron deficiency as well   Continue B12 supplementation and iron.  Hemoglobin is low but stable.  Noted to have hematuria.  Chronic Eliquis use This was started for DVT prophylaxis at the rehab.  Has been discontinued.  Placed on heparin which was stopped due to hematuria.    DVT prophylaxis: Currently SCDs. CODE STATUS: Currently full code Family Communication: No family at bedside.  Family to meet with palliative care today.  Consults  :  N Surg, CCS.  Palliative care  Disposition Plan  : Unclear for now  Status is: Inpatient  Remains inpatient appropriate because:Inpatient level of care appropriate due to severity of illness   Dispo: The patient is from: SNF              Anticipated d/c is to: SNF              Anticipated d/c date is: > 3 days              Patient currently is not medically stable to d/c.    Inpatient Medications  Scheduled Meds: . Chlorhexidine Gluconate Cloth  6 each Topical Daily  . collagenase   Topical Daily  . cyanocobalamin  1,000 mcg Intramuscular Daily  . docusate sodium  100 mg Oral BID  . ferrous sulfate  325 mg  Oral BID WC  .  morphine injection  2 mg Intravenous Once  . oxybutynin  5 mg Oral QHS  . pantoprazole  40 mg Oral QHS  . tamsulosin  0.4 mg Oral Daily   Continuous Infusions: . cefTRIAXone (ROCEPHIN)  IV 200 mL/hr at 05/24/20 1500  . sodium chloride irrigation     PRN Meds:.diphenhydrAMINE, morphine injection, [DISCONTINUED] ondansetron **OR** ondansetron (ZOFRAN) IV, opium-belladonna, Resource ThickenUp Clear  Antibiotics  :    Anti-infectives (From admission, onward)   Start     Dose/Rate Route Frequency Ordered Stop   05/24/20 1415  cefTRIAXone (ROCEPHIN) 1 g in sodium chloride 0.9 % 100 mL IVPB        1 g 200 mL/hr over 30 Minutes Intravenous Every 24 hours 05/24/20 1330     05/23/20 1400  ceFEPIme (MAXIPIME) 2 g in sodium chloride 0.9 % 100 mL IVPB  Status:  Discontinued        2 g 200 mL/hr over 30 Minutes Intravenous Every 8 hours 05/23/20 1011 05/24/20 1330   05/22/20 0500  ceFEPIme (MAXIPIME) 2 g in sodium chloride 0.9 % 100 mL IVPB  Status:  Discontinued        2 g 200 mL/hr over 30 Minutes Intravenous Every 12 hours 05/21/20 1614 05/23/20 1011   05/21/20 2015  aztreonam (AZACTAM) 2 g in sodium chloride 0.9 % 100 mL IVPB        2 g 200 mL/hr over 30 Minutes Intravenous  Once 05/21/20 2010 05/21/20 2230   05/21/20 1730  vancomycin (VANCOREADY) IVPB 1750 mg/350 mL        1,750 mg 175 mL/hr over 120 Minutes Intravenous  Once 05/21/20 1722 05/21/20 2148  05/21/20 1545  ceFEPIme (MAXIPIME) 2 g in sodium chloride 0.9 % 100 mL IVPB        2 g 200 mL/hr over 30 Minutes Intravenous  Once 05/21/20 1535 05/21/20 1704       Osvaldo Shipper M.D on 05/25/2020 at 9:37 AM  To page go to [email or web address]   [facility] -  Office  [phone]        Objective:   Vitals:   05/24/20 1235 05/24/20 2125 05/24/20 2300 05/25/20 0542  BP: 107/71 118/77  111/75  Pulse: 98 (!) 112  (!) 104  Resp: 16 18 18 18   Temp: 97.9 F (36.6 C) 98 F (36.7 C)  98.2 F (36.8 C)   TempSrc:  Axillary  Oral  SpO2: 99% 98% 98% 99%  Weight:      Height:        Wt Readings from Last 3 Encounters:  05/22/20 120.7 kg  05/03/20 84.7 kg  04/22/20 91 kg     Intake/Output Summary (Last 24 hours) at 05/25/2020 0937 Last data filed at 05/24/2020 1621 Gross per 24 hour  Intake 164.02 ml  Output 2500 ml  Net -2335.98 ml     Physical Exam  General appearance: Awake alert.  In no distress Resp: Clear to auscultation bilaterally.  Normal effort Cardio: S1-S2 is normal regular.  No S3-S4.  No rubs murmurs or bruit GI: Abdomen is soft.  Mildly tender in the suprapubic area without any rebound rigidity or guarding.  No masses organomegaly.  Bowel sounds present normal.   Extremities: No edema.   Neurologic: He does have quadriparesis.     Data Review:    CBC Recent Labs  Lab 05/21/20 1533 05/21/20 1541 05/22/20 0507 05/22/20 0507 05/22/20 3244 05/23/20 0432 05/23/20 2306 05/24/20 0127 05/25/20 0353  WBC 12.4*   < > 14.1*  --   --  8.1 10.1 10.6* 16.1*  HGB 9.4*   < > 8.1*   < > 7.7* 7.4* 8.1* 8.9* 8.4*  HCT 30.9*   < > 25.9*   < > 25.1* 23.9* 25.6* 27.4* 25.5*  PLT 187   < > 179  --   --  150 142* 179 175  MCV 94.2   < > 93.5  --   --  94.1 93.4 92.6 92.4  MCH 28.7   < > 29.2  --   --  29.1 29.6 30.1 30.4  MCHC 30.4   < > 31.3  --   --  31.0 31.6 32.5 32.9  RDW 15.7*   < > 15.8*  --   --  15.9* 15.6* 15.7* 16.2*  LYMPHSABS 1.1  --   --   --   --  1.4  --  1.8 1.4  MONOABS 1.1*  --   --   --   --  1.0  --  1.1* 1.5*  EOSABS 0.0  --   --   --   --  0.1  --  0.2 0.0  BASOSABS 0.0  --   --   --   --  0.0  --  0.1 0.1   < > = values in this interval not displayed.    Recent Labs  Lab 05/21/20 1533 05/21/20 1533 05/21/20 1541 05/21/20 1541 05/21/20 1544 05/21/20 1546 05/21/20 1640 05/21/20 2030 05/22/20 0507 05/23/20 0432 05/24/20 0127 05/25/20 0353  NA 140   < > 143   < > 142  --   --   --  137 141  141 142  K 4.1   < > 4.0   < > 4.0  --   --    --  3.7 3.3* 3.7 3.9  CL 102   < > 100  --   --   --   --   --  103 104 107 107  CO2 28  --   --   --   --   --   --   --  25 26 25 24   GLUCOSE 174*   < > 171*  --   --   --   --   --  124* 84 162* 148*  BUN 25*   < > 30*  --   --   --   --   --  24* 23 22 19   CREATININE 2.06*   < > 2.00*  --   --   --   --   --  1.50* 1.08 0.99 1.33*  CALCIUM 8.0*  --   --   --   --   --   --   --  7.9* 8.1* 8.1* 8.1*  AST 42*  --   --   --   --   --   --   --  34 46* 52* 36  ALT 32  --   --   --   --   --   --   --  27 35 44 37  ALKPHOS 83  --   --   --   --   --   --   --  71 67 74 88  BILITOT 0.4  --   --   --   --   --   --   --  0.6 0.8 0.9 0.9  ALBUMIN 1.8*  --   --   --   --   --   --   --  1.8* 1.6* 1.8* 1.9*  MG  --   --   --   --   --   --   --   --   --  1.9 1.9  --   CRP  --   --   --   --   --   --   --   --  0.7 0.7 0.6  --   PROCALCITON  --   --   --   --   --   --   --   --  0.12 0.11 <0.10  --   LATICACIDVEN 3.4*  --   --   --   --   --  2.7* 2.2*  --   --   --   --   INR 1.5*  --   --   --   --   --   --   --  1.5*  --   --   --   TSH  --   --   --   --   --   --  1.839  --   --   --   --   --   AMMONIA  --   --   --   --   --  21  --   --   --   --   --   --   BNP  --   --   --   --   --   --   --   --   --  35.1 32.0  --    < > =  values in this interval not displayed.     Micro Results Recent Results (from the past 240 hour(s))  Urine culture     Status: None   Collection Time: 05/21/20  3:33 PM   Specimen: In/Out Cath Urine  Result Value Ref Range Status   Specimen Description IN/OUT CATH URINE  Final   Special Requests NONE  Final   Culture   Final    NO GROWTH Performed at [facility], [address], [place], [place] [address]    Report Status 05/22/2020 FINAL  Final  Resp Panel by RT-PCR (Flu A&B, Covid) Nasopharyngeal Swab     Status: None   Collection Time: 05/21/20  3:35 PM   Specimen: Nasopharyngeal Swab; Nasopharyngeal(NP) swabs in vial transport medium   Result Value Ref Range Status   SARS Coronavirus 2 by RT PCR NEGATIVE NEGATIVE Final    Comment: (NOTE) SARS-CoV-2 target nucleic acids are NOT DETECTED.  The SARS-CoV-2 RNA is generally detectable in upper respiratory specimens during the acute phase of infection. The lowest concentration of SARS-CoV-2 viral copies this assay can detect is 138 copies/mL. A negative result does not preclude SARS-Cov-2 infection and should not be used as the sole basis for treatment or other patient management decisions. A negative result may occur with  improper specimen collection/handling, submission of specimen other than nasopharyngeal swab, presence of viral mutation(s) within the areas targeted by this assay, and inadequate number of viral copies(<138 copies/mL). A negative result must be combined with clinical observations, patient history, and epidemiological information. The expected result is Negative.  Fact Sheet for Patients:  BloggerCourse.com  Fact Sheet for Healthcare Providers:  SeriousBroker.it  This test is no t yet approved or cleared by the [place] FDA and  has been authorized for detection and/or diagnosis of SARS-CoV-2 by FDA under an Emergency Use Authorization (EUA). This EUA will remain  in effect (meaning this test can be used) for the duration of the COVID-19 declaration under Section 564(b)(1) of the Act, 21 U.S.C.section 360bbb-3(b)(1), unless the authorization is terminated  or revoked sooner.       Influenza A by PCR NEGATIVE NEGATIVE Final   Influenza B by PCR NEGATIVE NEGATIVE Final    Comment: (NOTE) The Xpert Xpress SARS-CoV-2/FLU/RSV plus assay is intended as an aid in the diagnosis of influenza from Nasopharyngeal swab specimens and should not be used as a sole basis for treatment. Nasal washings and aspirates are unacceptable for Xpert Xpress SARS-CoV-2/FLU/RSV testing.  Fact Sheet for  Patients: BloggerCourse.com  Fact Sheet for Healthcare Providers: SeriousBroker.it  This test is not yet approved or cleared by the [place] FDA and has been authorized for detection and/or diagnosis of SARS-CoV-2 by FDA under an Emergency Use Authorization (EUA). This EUA will remain in effect (meaning this test can be used) for the duration of the COVID-19 declaration under Section 564(b)(1) of the Act, 21 U.S.C. section 360bbb-3(b)(1), unless the authorization is terminated or revoked.  Performed at [facility], [address], [place], [place] [address]   Blood Culture (routine x 2)     Status: None (Preliminary result)   Collection Time: 05/21/20  4:40 PM   Specimen: BLOOD  Result Value Ref Range Status   Specimen Description BLOOD SITE NOT SPECIFIED  Final   Special Requests   Final    BOTTLES DRAWN AEROBIC ONLY Blood Culture results may not be optimal due to an inadequate volume of blood received in culture bottles  Culture   Final    NO GROWTH 4 DAYS Performed at [facility], [address], [place], [place] [address]    Report Status PENDING  Incomplete  Blood Culture (routine x 2)     Status: None (Preliminary result)   Collection Time: 05/21/20  4:42 PM   Specimen: BLOOD  Result Value Ref Range Status   Specimen Description BLOOD SITE NOT SPECIFIED  Final   Special Requests   Final    BOTTLES DRAWN AEROBIC ONLY Blood Culture results may not be optimal due to an inadequate volume of blood received in culture bottles   Culture   Final    NO GROWTH 4 DAYS Performed at [facility], [address], [place], [place] [address]    Report Status PENDING  Incomplete    Radiology Reports DG Cervical Spine 1 View  Result Date: 04/26/2020 CLINICAL DATA:  Status post surgical spinal fusion. EXAM: DG CERVICAL SPINE - 1 VIEW COMPARISON:  Radiographs of the cervical spine 04/22/2020. FINDINGS: A single  lateral view radiograph of the cervical spine is submitted. Redemonstrated sequela of prior C3-C5 ACDF with ventral plate and screw fixation. Redemonstrated interbody device at the C4-C5 level. IMPRESSION: Single lateral view radiograph of the cervical spine as above. Electronically Signed   By: Jackey Loge DO   On: 04/26/2020 14:58   CT Head Wo Contrast  Result Date: 05/21/2020 CLINICAL DATA:  Mental status change. EXAM: CT HEAD WITHOUT CONTRAST TECHNIQUE: Contiguous axial images were obtained from the base of the skull through the vertex without intravenous contrast. COMPARISON:  May 03, 2020 FINDINGS: Brain: No evidence of acute infarction, hemorrhage, hydrocephalus, extra-axial collection or mass lesion/mass effect. Mild brain parenchymal volume loss and deep white matter microangiopathy. Vascular: No hyperdense vessel or unexpected calcification. Skull: Normal. Negative for fracture or focal lesion. Sinuses/Orbits: No acute finding. Other: None. IMPRESSION: 1. No acute intracranial abnormality. 2. 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 Electronically Signed   By: Ted Mcalpine M.D.   On: 05/21/2020 17:53   CT Head Wo Contrast  Result Date: 05/03/2020 CLINICAL DATA:  Facial trauma after multiple falls. EXAM: CT HEAD WITHOUT CONTRAST CT CERVICAL SPINE WITHOUT CONTRAST TECHNIQUE: Multidetector CT imaging of the head and cervical spine was performed following the standard protocol without intravenous contrast. Multiplanar CT image reconstructions of the cervical spine were also generated. COMPARISON:  April 21, 2020. FINDINGS: CT HEAD FINDINGS Brain: Mild chronic ischemic white matter disease is noted. No mass effect or midline shift is noted. Ventricular size is within normal limits. There is no evidence of mass lesion, hemorrhage or acute infarction. Vascular: No hyperdense vessel or unexpected calcification. Skull: Normal. Negative for fracture or focal lesion.  Sinuses/Orbits: No acute finding. Other: None. CT CERVICAL SPINE FINDINGS Alignment: Normal. Skull base and vertebrae: No acute fracture is noted. Postsurgical changes are seen involving the C3, C4 and C5 vertebral bodies. Soft tissues and spinal canal: No prevertebral fluid or swelling. No visible canal hematoma. Disc levels: Status post surgical anterior fusion of C3-4 and C4-5. There is fusion of the C5-6 and C6-7 disc spaces secondary to degenerative change. Upper chest: Negative. Other: None. IMPRESSION: 1. Mild chronic ischemic white matter disease. No acute intracranial abnormality seen. 2. Postsurgical and degenerative changes as described above. No acute abnormality seen in the cervical spine. Electronically Signed   By: Lupita Raider M.D.   On: 05/03/2020 14:41   CT Chest Wo Contrast  Result Date:  05/03/2020 CLINICAL DATA:  Short of breath, fever, sepsis, multiple falls EXAM: CT CHEST WITHOUT CONTRAST TECHNIQUE: Multidetector CT imaging of the chest was performed following the standard protocol without IV contrast. COMPARISON:  05/03/2020 FINDINGS: Cardiovascular: Unenhanced imaging of the heart and great vessels demonstrates trace pericardial fluid. The heart is not enlarged. Aneurysmal dilatation of the ascending thoracic aorta measuring up to 4.4 cm. Evaluation of the vascular lumen is limited without intravenous contrast. Mediastinum/Nodes: No enlarged mediastinal or axillary lymph nodes. Thyroid gland, trachea, and esophagus demonstrate no significant findings. Lungs/Pleura: There is diffuse bronchial wall thickening. Complete opacification of the bronchus intermedius and right middle and right lower lobe bronchi are seen, along with partial opacification of the left lower lobe bronchus. There is patchy airspace disease within the right upper and left lower lobes. Complete consolidation of the right middle and right lower lobes with associated volume loss consistent with atelectasis. No  effusion or pneumothorax. Upper Abdomen: Calcified gallstone is identified. The remainder of the upper abdomen is unremarkable. Musculoskeletal: Severe left shoulder osteoarthritis. No acute or destructive bony lesions. Reconstructed images demonstrate no additional findings. IMPRESSION: 1. Diffuse bronchial wall thickening, with patchy airspace disease of the right upper and left lower lobes, consistent with bronchopneumonia. 2. Opacification of the right middle, right lower, and left lower lobe bronchi, with complete collapse of the right middle and right lower lobes. This may be related to mucous plugging or underlying infection. 3. Trace pericardial fluid. 4. Cholelithiasis without cholecystitis. 5. 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 Recommend annual imaging followup by CTA or MRA. This recommendation follows 2010 ACCF/AHA/AATS/ACR/ASA/SCA/SCAI/SIR/STS/SVM Guidelines for the Diagnosis and Management of Patients with Thoracic Aortic Disease. Circulation. 2010; 121: Z610-R604. Aortic aneurysm NOS (ICD10-I71.9) Electronically Signed   By: Sharlet Salina M.D.   On: 05/03/2020 16:31   CT Cervical Spine Wo Contrast  Result Date: 05/03/2020 CLINICAL DATA:  Facial trauma after multiple falls. EXAM: CT HEAD WITHOUT CONTRAST CT CERVICAL SPINE WITHOUT CONTRAST TECHNIQUE: Multidetector CT imaging of the head and cervical spine was performed following the standard protocol without intravenous contrast. Multiplanar CT image reconstructions of the cervical spine were also generated. COMPARISON:  April 21, 2020. FINDINGS: CT HEAD FINDINGS Brain: Mild chronic ischemic white matter disease is noted. No mass effect or midline shift is noted. Ventricular size is within normal limits. There is no evidence of mass lesion, hemorrhage or acute infarction. Vascular: No hyperdense vessel or unexpected calcification. Skull: Normal. Negative for fracture or focal lesion. Sinuses/Orbits: No acute  finding. Other: None. CT CERVICAL SPINE FINDINGS Alignment: Normal. Skull base and vertebrae: No acute fracture is noted. Postsurgical changes are seen involving the C3, C4 and C5 vertebral bodies. Soft tissues and spinal canal: No prevertebral fluid or swelling. No visible canal hematoma. Disc levels: Status post surgical anterior fusion of C3-4 and C4-5. There is fusion of the C5-6 and C6-7 disc spaces secondary to degenerative change. Upper chest: Negative. Other: None. IMPRESSION: 1. Mild chronic ischemic white matter disease. No acute intracranial abnormality seen. 2. Postsurgical and degenerative changes as described above. No acute abnormality seen in the cervical spine. Electronically Signed   By: Lupita Raider M.D.   On: 05/03/2020 14:41   DG Chest Port 1 View  Result Date: 05/21/2020 CLINICAL DATA:  Found unresponsive. EXAM: PORTABLE CHEST 1 VIEW COMPARISON:  May 08, 2020 FINDINGS: There is stable elevation of the right hemidiaphragm. Mild bilateral infrahilar atelectasis is seen. There is no evidence of a pleural  effusion or pneumothorax. The heart size and mediastinal contours are within normal limits. The visualized skeletal structures are unremarkable. IMPRESSION: Mild bilateral infrahilar atelectasis. Electronically Signed   By: Aram Candela M.D.   On: 05/21/2020 16:34   DG Chest Port 1 View  Result Date: 05/08/2020 CLINICAL DATA:  Mucous plugging EXAM: PORTABLE CHEST 1 VIEW COMPARISON:  Two days ago FINDINGS: Low volume chest with opacity at both lung bases. Air bronchograms seen on the left. No edema, effusion, or pneumothorax. Normal heart size IMPRESSION: Continued volume loss with extensive atelectasis at the bases. Electronically Signed   By: Marnee Spring M.D.   On: 05/08/2020 06:46   DG Chest Port 1 View  Result Date: 05/06/2020 CLINICAL DATA:  Shortness of breath EXAM: PORTABLE CHEST 1 VIEW COMPARISON:  May 05, 2020 FINDINGS: Persistent right pleural effusion  with areas of atelectatic change in the lung bases noted. Heart size and pulmonary vascularity are normal. No adenopathy. No bone lesions. IMPRESSION: Stable right pleural effusion with bibasilar atelectasis. A degree of superimposed infiltrate in the right base cannot be excluded. Stable cardiac silhouette. Electronically Signed   By: Bretta Bang III M.D.   On: 05/06/2020 08:29   DG Chest Port 1 View  Result Date: 05/05/2020 CLINICAL DATA:  Respiratory distress. EXAM: PORTABLE CHEST 1 VIEW COMPARISON:  05/03/2020 FINDINGS: The cardiac silhouette, mediastinal and hilar contours are within normal limits and stable. Moderate-sized right pleural effusion with overlying atelectasis or infiltrate. Patchy left basilar infiltrate. IMPRESSION: Moderate-sized right pleural effusion with overlying atelectasis or infiltrate. Patchy left basilar infiltrate. Electronically Signed   By: Rudie Meyer M.D.   On: 05/05/2020 07:15   DG CHEST PORT 1 VIEW  Result Date: 05/04/2020 CLINICAL DATA:  Respiratory distress EXAM: PORTABLE CHEST 1 VIEW COMPARISON:  05/03/2020 FINDINGS: Unchanged right basilar consolidation. Cardiomediastinal contours and pleural spaces are normal. Left lung is clear. IMPRESSION: Unchanged right basilar consolidation. Electronically Signed   By: Deatra Robinson M.D.   On: 05/04/2020 00:07   DG Chest Port 1 View  Result Date: 05/03/2020 CLINICAL DATA:  Questionable sepsis. EXAM: PORTABLE CHEST 1 VIEW COMPARISON:  01/09/2020. FINDINGS: The heart size and mediastinal contours are within normal limits. Low lung volumes. Left basilar opacities. No visible pleural effusions or pneumothorax. Elevated right hemidiaphragm. No acute osseous abnormality. Bilateral shoulder degenerative change. Partially imaged cervical ACDF. IMPRESSION: Low lung volumes with left basilar opacities, which may represent atelectasis, aspiration and/or pneumonia. Dedicated PA and lateral radiographs could better  characterize if clinically indicated. Electronically Signed   By: Feliberto Harts MD   On: 05/03/2020 13:12   DG Swallowing Func-Speech Pathology  Result Date: 05/04/2020 Objective Swallowing Evaluation: Type of Study: Bedside Swallow Evaluation  Patient Details Name: TERIK HAUGHEY MRN: [id number] Date of Birth: 1946-03-02 Today's Date: 05/04/2020 Time: SLP Start Time (ACUTE ONLY): 1044 -SLP Stop Time (ACUTE ONLY): 1103 SLP Time Calculation (min) (ACUTE ONLY): 19 min Past Medical History: Past Medical History: Diagnosis Date . Asthma  . 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 Newell Coral, 1992-->surg at [facility].  Summer 2021->Cervicalgia,arm pain, hand atrophy RUE wkness, hyperreflex (Dr. 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 Hay fever  . Hypertension  . Osteoarthritis of both hands  Past Surgical History: Past Surgical History: Procedure Laterality Date . ANTERIOR CERVICAL DECOMP/DISCECTOMY FUSION N/A 04/05/2020  Procedure: ANTERIOR CERVICAL DECOMPRESSION/DISCECTOMY FUSION, INTERBODY PROSTHESIS, PLATE/SCREWS CERVICAL  THREE-CERVICAL FOUR, CERVICAL FOUR- CERVICAL FIVE;  Surgeon: Tressie Stalker, MD;  Location: [facility] OR;  Service: Neurosurgery;  Laterality: N/A; . ANTERIOR CERVICAL DECOMP/DISCECTOMY FUSION N/A 04/22/2020  Procedure: Reexploration of anterior cervical wound for epidural hematoma;  Surgeon: Donalee Citrin, MD;  Location: [facility] OR;  Service: Neurosurgery;  Laterality: N/A; . BACK SURGERY   . INCISION AND DRAINAGE Left 2012  L hand infection . ROTATOR CUFF REPAIR Right   x 3 . SPINE SURGERY   HPI: 74 year old white male who presented with right lower lobe pneumonia and known history of  Quadriparesis.  He originally had a C3-4 Marion 4-5 anterior cervical disc asked to me with decompression, C3 3-4 and C4-5 interbody arthrodesis with local autograft bone, anterior cervical  plating of C3-C5 on 04/05/2020.  He then went to rehab and was discharged.  On 04/21/2020 the patient presented to the emergency room with progressive bilateral quadriplegia.  Patient went to the operating room and had decompression of a vertebral hematoma.  And been discharged on 11 10/2019 and readmitted on 05/03/2020 for increasing shortness of breath and productive cough.  Chest x-ray demonstrated a new right lower lobe infiltrate with compressive atelectasis, concerning for aspiration pneumonia.  Pt had MBSS during most recent admission on 04/25/2020 with recommendations for puree and thin liquids, advanced to Dys3 prior to discharge  Subjective: alert, cooperative, pleasant, participative. Assessment / Plan / Recommendation CHL IP CLINICAL IMPRESSIONS 05/04/2020 Clinical Impression Pt presents with moderate oropharyngeal dysphagia c/b decreased base of tongue retraction, delayed swallow inititation, incomplete laryngeal closure, reduced pharyngeal perstalsis, decreased UES opening, and diminished sensation. These deficits resulted in silent aspiration of thin liquid and nectar thick liquid by straw prior to the swallow. With nectar thick liquid by cup, only transient penetration was seen. There was significant pharyngeal residue with puree and solid consistencies which was reduced but not fully cleared with liquid wash. There is noticeable prevertebral edema in pharynx which appears to be impacting swallow function, but pt is able to acheive adequate epiglottic deflection. This study represents a marked decline in swallow function from MBSS on 04/25/20; however, from review of images in both studies, amount of edema appears comparable to naked eye without any accurate measurement, or comparing level of magnification during each study. Because of recent ACDF (11/13) and revision (10/29), and presence of cervical collar, SLP intervention options are limited outside of modification of diet. Pt cannot trial  compensatory strategies requiring change to head/neck positioning, and many swallow exercises cannot be executed in this position. Pt may benefit from some tongue strengthening exercises to improve swallow drive and decrease vallecula residue. Recommend initiating full liquid diet with nectar thick liquids, by cup sip only. NO STRAWS. Please crush medications if permissible. Called neice, [name], and shared results of today's study which she will pass on to pt's daughter who will be coming to town tomorrow. SLP Visit Diagnosis -- Attention and concentration deficit following -- Frontal lobe and executive function deficit following -- Impact on safety and function --   CHL IP TREATMENT RECOMMENDATION 05/04/2020 Treatment Recommendations Therapy as outlined in treatment plan below   Prognosis 05/04/2020 Prognosis for Safe Diet Advancement Fair Barriers to Reach Goals Cognitive deficits Barriers/Prognosis Comment -- CHL IP DIET RECOMMENDATION 05/04/2020 SLP Diet Recommendations Nectar thick liquid Liquid Administration via No straw Medication Administration -- Compensations Slow rate;Small sips/bites;Follow solids with liquid Postural Changes Seated upright at 90 degrees;Remain semi-upright after after feeds/meals (Comment)   CHL IP OTHER RECOMMENDATIONS 05/04/2020 Recommended Consults -- Oral  Care Recommendations Oral care BID Other Recommendations --   CHL IP FOLLOW UP RECOMMENDATIONS 05/04/2020 Follow up Recommendations (No Data)   CHL IP FREQUENCY AND DURATION 05/04/2020 Speech Therapy Frequency (ACUTE ONLY) min 2x/week Treatment Duration 2 weeks      CHL IP ORAL PHASE 05/04/2020 Oral Phase -- Oral - Pudding Teaspoon -- Oral - Pudding Cup -- Oral - Honey Teaspoon -- Oral - Honey Cup -- Oral - Nectar Teaspoon -- Oral - Nectar Cup WFL Oral - Nectar Straw Premature spillage;Decreased bolus cohesion Oral - Thin Teaspoon -- Oral - Thin Cup Premature spillage Oral - Thin Straw Premature spillage Oral - Puree WFL Oral -  Mech Soft WFL Oral - Regular -- Oral - Multi-Consistency -- Oral - Pill WFL Oral Phase - Comment --  CHL IP PHARYNGEAL PHASE 05/04/2020 Pharyngeal Phase Impaired Pharyngeal- Pudding Teaspoon -- Pharyngeal -- Pharyngeal- Pudding Cup -- Pharyngeal -- Pharyngeal- Honey Teaspoon -- Pharyngeal -- Pharyngeal- Honey Cup -- Pharyngeal -- Pharyngeal- Nectar Teaspoon -- Pharyngeal -- Pharyngeal- Nectar Cup -- Pharyngeal -- Pharyngeal- Nectar Straw -- Pharyngeal -- Pharyngeal- Thin Teaspoon -- Pharyngeal -- Pharyngeal- Thin Cup -- Pharyngeal -- Pharyngeal- Thin Straw -- Pharyngeal -- Pharyngeal- Puree -- Pharyngeal -- Pharyngeal- Mechanical Soft -- Pharyngeal -- Pharyngeal- Regular -- Pharyngeal -- Pharyngeal- Multi-consistency -- Pharyngeal -- Pharyngeal- Pill -- Pharyngeal -- Pharyngeal Comment --  CHL IP CERVICAL ESOPHAGEAL PHASE 05/04/2020 Cervical Esophageal Phase Impaired Pudding Teaspoon -- Pudding Cup -- Honey Teaspoon -- Honey Cup -- Nectar Teaspoon -- Nectar Cup Reduced cricopharyngeal relaxation Nectar Straw Reduced cricopharyngeal relaxation Thin Teaspoon -- Thin Cup Reduced cricopharyngeal relaxation Thin Straw Reduced cricopharyngeal relaxation Puree Reduced cricopharyngeal relaxation Mechanical Soft Reduced cricopharyngeal relaxation Regular -- Multi-consistency -- Pill Reduced cricopharyngeal relaxation Cervical Esophageal Comment trace esophageal retention of contrast Kerrie Pleasure, MA, CCC-SLP Acute Rehabilitation Services Office: [phone] 05/04/2020, 11:59 AM              DG Swallowing Func-Speech Pathology  Result Date: 04/25/2020 Objective Swallowing Evaluation: Type of Study: MBS-Modified Barium Swallow Study  Patient Details Name: WILMA WUTHRICH MRN: [id number] Date of Birth: [birth year] Today's Date: 04/25/2020 Time: SLP Start Time (ACUTE ONLY): 1043 -SLP Stop Time (ACUTE ONLY): 1057 SLP Time Calculation (min) (ACUTE ONLY): 14 min Past Medical History: Past Medical History: Diagnosis Date . Asthma   . 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 Newell Coral, 1992-->surg at [facility].  Summer 2021->Cervicalgia,arm pain, hand atrophy RUE wkness, hyperreflex (Dr. 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 Hay fever  . Hypertension  . Osteoarthritis of both hands  Past Surgical History: Past Surgical History: Procedure Laterality Date . ANTERIOR CERVICAL DECOMP/DISCECTOMY FUSION N/A 04/05/2020  Procedure: ANTERIOR CERVICAL DECOMPRESSION/DISCECTOMY FUSION, INTERBODY PROSTHESIS, PLATE/SCREWS CERVICAL THREE-CERVICAL FOUR, CERVICAL FOUR- CERVICAL FIVE;  Surgeon: Tressie Stalker, MD;  Location: [facility] OR;  Service: Neurosurgery;  Laterality: N/A; . ANTERIOR CERVICAL DECOMP/DISCECTOMY FUSION N/A 04/22/2020  Procedure: Reexploration of anterior cervical wound for epidural hematoma;  Surgeon: Donalee Citrin, MD;  Location: [facility] OR;  Service: Neurosurgery;  Laterality: N/A; . BACK SURGERY   . INCISION AND DRAINAGE Left 2012  L hand infection . ROTATOR CUFF REPAIR Right   x 3 . SPINE SURGERY   HPI: Pt is a 74 y.o. male with significant PMH of OA in bilateral hands, HTN, Chiari malformation admission to hospital for ACDF from 10/13-10/15/21 and then d/c to inpatient rehab  from 10/15-10/27/21. 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 Pt s/p evacuation of hemoatoma, removal of hardware and re-implantation of titanium cage packed with autograft bone on 04/22/20.  Pt in hard collar post op. CT head 10/29 negative for acute changes.  Subjective: alert, cooperative Assessment / Plan / Recommendation CHL IP CLINICAL IMPRESSIONS 04/25/2020 Clinical Impression Pt presents with a mild oropharyngeal swallow but with no aspiration or penetration observed. He has reduced posterior propulsion orally with mildly slow mastication and  lingual rocking with solids. Lingual residue c/b a mild coating on the tongue is also noted with solids. His base of tongue retraction is mildly reduced, leading to mild vallecular residue with solids. His UES is opening also appears to be mildly reduced, which could be impacted by location of cervical hardware. Recommend continuing current diet with SLP f/u for advancement to more solid textures, which can be done clinically.  SLP Visit Diagnosis Dysphagia, oropharyngeal phase (R13.12) Attention and concentration deficit following -- Frontal lobe and executive function deficit following -- Impact on safety and function Mild aspiration risk   CHL IP TREATMENT RECOMMENDATION 04/25/2020 Treatment Recommendations Therapy as outlined in treatment plan below   Prognosis 04/25/2020 Prognosis for Safe Diet Advancement Good Barriers to Reach Goals Cognitive deficits Barriers/Prognosis Comment -- CHL IP DIET RECOMMENDATION 04/25/2020 SLP Diet Recommendations Dysphagia 1 (Puree) solids;Thin liquid Liquid Administration via Cup;Straw Medication Administration Whole meds with puree Compensations Slow rate;Small sips/bites Postural Changes Seated upright at 90 degrees   CHL IP OTHER RECOMMENDATIONS 04/25/2020 Recommended Consults -- Oral Care Recommendations Oral care BID Other Recommendations --   CHL IP FOLLOW UP RECOMMENDATIONS 04/25/2020 Follow up Recommendations Skilled Nursing facility   [facility] IP FREQUENCY AND DURATION 04/25/2020 Speech Therapy Frequency (ACUTE ONLY) min 2x/week Treatment Duration 2 weeks      CHL IP ORAL PHASE 04/25/2020 Oral Phase Impaired Oral - Pudding Teaspoon -- Oral - Pudding Cup -- Oral - Honey Teaspoon -- Oral - Honey Cup -- Oral - Nectar Teaspoon -- Oral - Nectar Cup -- Oral - Nectar Straw -- Oral - Thin Teaspoon -- Oral - Thin Cup Reduced posterior propulsion Oral - Thin Straw Reduced posterior propulsion Oral - Puree Reduced posterior propulsion;Delayed oral transit;Lingual/palatal residue Oral - Mech  Soft Reduced posterior propulsion;Delayed oral transit;Lingual/palatal residue;Impaired mastication Oral - Regular -- Oral - Multi-Consistency -- Oral - Pill Reduced posterior propulsion Oral Phase - Comment --  CHL IP PHARYNGEAL PHASE 04/25/2020 Pharyngeal Phase Impaired Pharyngeal- Pudding Teaspoon -- Pharyngeal -- Pharyngeal- Pudding Cup -- Pharyngeal -- Pharyngeal- Honey Teaspoon -- Pharyngeal -- Pharyngeal- Honey Cup -- Pharyngeal -- Pharyngeal- Nectar Teaspoon -- Pharyngeal -- Pharyngeal- Nectar Cup -- Pharyngeal -- Pharyngeal- Nectar Straw -- Pharyngeal -- Pharyngeal- Thin Teaspoon -- Pharyngeal -- Pharyngeal- Thin Cup Reduced tongue base retraction;Pharyngeal residue - valleculae Pharyngeal -- Pharyngeal- Thin Straw Reduced tongue base retraction;Pharyngeal residue - valleculae Pharyngeal -- Pharyngeal- Puree Reduced tongue base retraction;Pharyngeal residue - valleculae Pharyngeal -- Pharyngeal- Mechanical Soft Reduced tongue base retraction;Pharyngeal residue - valleculae Pharyngeal -- Pharyngeal- Regular -- Pharyngeal -- Pharyngeal- Multi-consistency -- Pharyngeal -- Pharyngeal- Pill Reduced tongue base retraction;Pharyngeal residue - valleculae Pharyngeal -- Pharyngeal Comment --  CHL IP CERVICAL ESOPHAGEAL PHASE 04/25/2020 Cervical Esophageal Phase Impaired Pudding Teaspoon -- Pudding Cup -- Honey Teaspoon -- Honey Cup -- Nectar Teaspoon -- Nectar Cup -- Nectar Straw -- Thin Teaspoon -- Thin Cup Reduced cricopharyngeal relaxation Thin Straw Reduced cricopharyngeal relaxation Puree Reduced  cricopharyngeal relaxation Mechanical Soft Reduced cricopharyngeal relaxation Regular -- Multi-consistency -- Pill Reduced cricopharyngeal relaxation Cervical Esophageal Comment -- Mahala Menghini., M.A. CCC-SLP Acute Rehabilitation Services Pager [phone] Office [phone] 04/25/2020, 4:13 PM

## 2020-05-25 NOTE — Progress Notes (Signed)
Called left message for Dr. Marlou Porch.

## 2020-05-25 NOTE — Progress Notes (Signed)
Patient c/o bladder spasms, uncontrolled pain overnight. Urinary catheter was not draining. Blood, urine, and saline pouring out around catheter, soaking four towels. Bladder scanned patient and volume >600. Deflated balloon, advanced catheter, reinflated balloon with 66ml. Catheter began to drain, patient had 1100 output in five minutes. Turned CBI back on to slow setting.

## 2020-05-25 NOTE — Progress Notes (Signed)
PMT consult received and chart reviewed. No family at bedside this AM. Needs daughter for GOC discussion. Received call from patient's nurse who has spoken with daughter today. Daughter will be at [facility] tomorrow 12/3 by 3pm for GOC discussion with palliative NP. Thank you.   NO CHARGE  Vennie Homans, DNP, FNP-C Palliative Medicine Team  Phone: [phone] Fax: [phone]

## 2020-05-25 NOTE — Progress Notes (Signed)
OT Cancellation Note  Patient Details Name: Shannon [name] MRN: [id number] DOB: 08/16/1945   Cancelled Treatment:    Reason Eval/Treat Not Completed: Patient at procedure or test/ unavailable (hydrotherapy with PT);Other - Pt to have palliative meeting this afternoon to establish goals of care. OT will continue to follow acutely.   Evern Bio Yahya Boldman 05/25/2020, 11:42 AM   Nyoka Cowden OTR/L Acute Rehabilitation Services Pager: [phone] Office: [phone]

## 2020-05-25 NOTE — Progress Notes (Signed)
Catheter has been leaking around insertion site. More leaking from site than what is being drained in bag. Patient has received x2 doses of morphine, x2 suppositories for spasms, an additional order of flexeril, and ditropan and bladder spasms have persisted all night. HR has remained in between 110-140 depending on pain intensity. CBI has been flowing at a slow-moderate speed throughout night.

## 2020-05-26 DIAGNOSIS — A419 Sepsis, unspecified organism: Secondary | ICD-10-CM | POA: Diagnosis not present

## 2020-05-26 DIAGNOSIS — Z7189 Other specified counseling: Secondary | ICD-10-CM

## 2020-05-26 DIAGNOSIS — I10 Essential (primary) hypertension: Secondary | ICD-10-CM | POA: Diagnosis not present

## 2020-05-26 DIAGNOSIS — D62 Acute posthemorrhagic anemia: Secondary | ICD-10-CM | POA: Diagnosis not present

## 2020-05-26 DIAGNOSIS — Z978 Presence of other specified devices: Secondary | ICD-10-CM

## 2020-05-26 DIAGNOSIS — Z515 Encounter for palliative care: Secondary | ICD-10-CM | POA: Diagnosis not present

## 2020-05-26 DIAGNOSIS — G8250 Quadriplegia, unspecified: Secondary | ICD-10-CM | POA: Diagnosis not present

## 2020-05-26 LAB — COMPREHENSIVE METABOLIC PANEL
ALT: 27 U/L (ref 0–44)
AST: 31 U/L (ref 15–41)
Albumin: 1.8 g/dL — ABNORMAL LOW (ref 3.5–5.0)
Alkaline Phosphatase: 80 U/L (ref 38–126)
Anion gap: 10 (ref 5–15)
BUN: 17 mg/dL (ref 8–23)
CO2: 27 mmol/L (ref 22–32)
Calcium: 8.3 mg/dL — ABNORMAL LOW (ref 8.9–10.3)
Chloride: 104 mmol/L (ref 98–111)
Creatinine, Ser: 1.06 mg/dL (ref 0.61–1.24)
GFR, Estimated: >60 mL/min (ref >60)
Glucose, Bld: 100 mg/dL — ABNORMAL HIGH (ref 70–99)
Potassium: 3.6 mmol/L (ref 3.5–5.1)
Sodium: 141 mmol/L (ref 135–145)
Total Bilirubin: 1 mg/dL (ref 0.3–1.2)
Total Protein: 5.1 g/dL — ABNORMAL LOW (ref 6.5–8.1)

## 2020-05-26 LAB — CBC WITH DIFFERENTIAL/PLATELET
Abs Immature Granulocytes: 0.62 10*3/uL — ABNORMAL HIGH (ref 0.00–0.07)
Basophils Absolute: 0.1 10*3/uL (ref 0.0–0.1)
Basophils Relative: 1 %
Eosinophils Absolute: 0.1 10*3/uL (ref 0.0–0.5)
Eosinophils Relative: 1 %
HCT: 23.9 % — ABNORMAL LOW (ref 39.0–52.0)
Hemoglobin: 7.2 g/dL — ABNORMAL LOW (ref 13.0–17.0)
Immature Granulocytes: 5 %
Lymphocytes Relative: 17 %
Lymphs Abs: 2.1 10*3/uL (ref 0.7–4.0)
MCH: 29.8 pg (ref 26.0–34.0)
MCHC: 30.1 g/dL (ref 30.0–36.0)
MCV: 98.8 fL (ref 80.0–100.0)
Monocytes Absolute: 0.9 10*3/uL (ref 0.1–1.0)
Monocytes Relative: 7 %
Neutro Abs: 8.7 10*3/uL — ABNORMAL HIGH (ref 1.7–7.7)
Neutrophils Relative %: 69 %
Platelets: 145 10*3/uL — ABNORMAL LOW (ref 150–400)
RBC: 2.42 MIL/uL — ABNORMAL LOW (ref 4.22–5.81)
RDW: 16.6 % — ABNORMAL HIGH (ref 11.5–15.5)
WBC: 12.4 10*3/uL — ABNORMAL HIGH (ref 4.0–10.5)
nRBC: 0.2 % (ref 0.0–0.2)

## 2020-05-26 LAB — CULTURE, BLOOD (ROUTINE X 2)
Culture: NO GROWTH
Culture: NO GROWTH

## 2020-05-26 LAB — PREPARE RBC (CROSSMATCH)

## 2020-05-26 MED ORDER — SODIUM CHLORIDE 0.9% IV SOLUTION: Freq: Once | INTRAVENOUS | Status: AC

## 2020-05-26 NOTE — Evaluation (Signed)
Occupational Therapy Evaluation Patient Details Name: Shannon [name] MRN: [id number] DOB: 23-Aug-1945 Today's Date: 05/26/2020    History of Present Illness Pt is a 74 y/o male with PMH of quadriplegia which follows surgery for Chiari I malformation with syringomyelia surgery in October 2021, HTN, OA. Presenting this admission from SNF with UTI, dehydration and hypotension. Also has several sacral decubitus ulcers.    Clinical Impression   Patient admitted for above and limited by problem list below. Patient continues to present with decreased strength and coordination in BUES, decreased activity tolerance and pain.  He follows simple commands and engages well with therapist, but poor initiation, recall, and disoriented to time.  He requires total assist for self feeding, grooming and all ADLs at this time (attempting hand over hand to wash face, pt voicing "please don't, it hurts too bad"); mobility deferred for patient comfort/pain mgmt. Will monitor for splints to BUEs.  Notified RN for trial of soft call bell to assist pt in being able to call out for his needs. OT seen after discussion with palliative care; educated patient on continued OT per his request and goals. Based on performance today, patient will need 24/7 physical assist at dc with recommendations below.  Will follow.     Follow Up Recommendations  SNF;Supervision/Assistance - 24 hour    Equipment Recommendations  None recommended by OT    Recommendations for Other Services Other (comment) (palliative care )     Precautions / Restrictions Precautions Precautions: Fall;Cervical Required Braces or Orthoses: Cervical Brace Cervical Brace: Hard collar;At all times Restrictions Weight Bearing Restrictions: No      Mobility Bed Mobility               General bed mobility comments: deferred due to pain, would require total +2 assist     Transfers                 General transfer comment: deferred due to  pain    Balance                                           ADL either performed or assessed with clinical judgement   ADL Overall ADL's : Needs assistance/impaired     Grooming: Wash/dry hands;Wash/dry face;Total assistance;Bed level Grooming Details (indicate cue type and reason): total assist, initally attempted HOH to wash face with pt verbalizing "please don't, it hurts too bad"                              Functional mobility during ADLs: Total assistance General ADL Comments: would require total assist for all self care and mobility at this time      Vision   Vision Assessment?: No apparent visual deficits     Perception     Praxis      Pertinent Vitals/Pain Pain Assessment: Faces Faces Pain Scale: Hurts even more Pain Location: buttocks and reports "every movement hurts"  Pain Descriptors / Indicators: Discomfort;Grimacing Pain Intervention(s): Limited activity within patient's tolerance;Monitored during session     Hand Dominance Right   Extremity/Trunk Assessment Upper Extremity Assessment Upper Extremity Assessment: RUE deficits/detail;LUE deficits/detail RUE Deficits / Details: 3-/5 grasp and elbow extension, 2/5 hand extension, supination, elbow flexion; contractures developing in R hand (with hx of OA); PROM WFL; edema  RUE Sensation: WNL RUE  Coordination: decreased fine motor;decreased gross motor LUE Deficits / Details: 3-/5 grasp and elbow extension but weaker than R side, 2-/5 hand extension, supination, elbow flexion; PROM WFL; edema  LUE Sensation: WNL LUE Coordination: decreased fine motor;decreased gross motor   Lower Extremity Assessment Lower Extremity Assessment: Defer to PT evaluation   Cervical / Trunk Assessment Cervical / Trunk Exceptions: in cervical collar   Communication Communication Communication: No difficulties   Cognition Arousal/Alertness: Awake/alert Behavior During Therapy: WFL for tasks  assessed/performed Overall Cognitive Status: No family/caregiver present to determine baseline cognitive functioning Area of Impairment: Orientation;Following commands;Problem solving;Awareness;Memory                 Orientation Level: Disoriented to;Time   Memory: Decreased short-term memory Following Commands: Follows one step commands consistently;Follows one step commands with increased time   Awareness: Emergent Problem Solving: Slow processing;Decreased initiation;Difficulty sequencing;Requires verbal cues General Comments: patient pleasant and cooperative, re-oriented to time, follows simple commands with increased time    General Comments  PROM of BUEs     Exercises     Shoulder Instructions      Home Living Family/patient expects to be discharged to:: Skilled nursing facility                                        Prior Functioning/Environment Level of Independence: Needs assistance  Gait / Transfers Assistance Needed: assist from staff at SNF  ADL's / Homemaking Assistance Needed: assist from staff at SNF, not able to feed himself             OT Problem List: Decreased strength;Decreased range of motion;Impaired balance (sitting and/or standing);Decreased coordination;Decreased safety awareness;Decreased knowledge of use of DME or AE;Decreased knowledge of precautions;Impaired sensation;Impaired UE functional use;Decreased activity tolerance;Decreased cognition;Pain;Increased edema      OT Treatment/Interventions: Self-care/ADL training;Neuromuscular education;Therapeutic activities;Patient/family education;Cognitive remediation/compensation;Splinting    OT Goals(Current goals can be found in the care plan section) Acute Rehab OT Goals Patient Stated Goal: less pain  OT Goal Formulation: With patient Time For Goal Achievement: 06/09/20 Potential to Achieve Goals: Fair  OT Frequency: Min 2X/week   Barriers to D/C:             Co-evaluation              AM-PAC OT "6 Clicks" Daily Activity     Outcome Measure Help from another person eating meals?: Total Help from another person taking care of personal grooming?: Total Help from another person toileting, which includes using toliet, bedpan, or urinal?: Total Help from another person bathing (including washing, rinsing, drying)?: Total Help from another person to put on and taking off regular upper body clothing?: Total Help from another person to put on and taking off regular lower body clothing?: Total 6 Click Score: 6   End of Session Equipment Utilized During Treatment: Cervical collar Nurse Communication: Mobility status;Other (comment) (trial of soft call bell)  Activity Tolerance: Patient limited by pain Patient left: in bed;with call bell/phone within reach  OT Visit Diagnosis: Other abnormalities of gait and mobility (R26.89);Muscle weakness (generalized) (M62.81);Pain Pain - Right/Left:  (bil) Pain - part of body:  (buttocks, "all over")                Time: 5643-3295 OT Time Calculation (min): 14 min Charges:  OT General Charges $OT Visit: 1 Visit OT Evaluation $OT Eval Moderate Complexity: 1 Mod  Barry Brunner, OT Acute Rehabilitation Services Pager [phone] Office [phone]   Chancy Milroy 05/26/2020, 1:50 PM

## 2020-05-26 NOTE — Progress Notes (Signed)
PROGRESS NOTE                                                                                                                                                                                                             Patient Demographics:    Shannon [name], is a 74 y.o. male, DOB - 06-09-1946, ZOX:[id number]  Outpatient Primary MD for the patient is McGowen, Maryjean Morn, MD    LOS - 5  Admit date - 05/21/2020    Chief Complaint  Patient presents with  . Unresponsive       Brief Narrative (HPI from H&P) - Shannon [name] is a 73 y.o. 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  Patient had anterior approach for the repair.  He then went home and had a fall from his bed, he was then hospitalized and from here he was sent to a nursing home, he has been quadriplegic since then, he has also developed sacral decubitus ulcers.   In the ER this admission he was found to have UTI, dehydration with found hypotension.  A Foley catheter was placed in the ER however Foley did not yield any urine output but frank blood.       Subjective:   Patient seems to be upset for reasons he is not able to specify.  However he denies any pain nausea vomiting.      Assessment  & Plan :     Acute metabolic encephalopathy due to UTI, sepsis and hypotension Head CT was unremarkable.  Patient was treated with IV antibiotics.  Mentation has improved.   Blood and urine cultures without any growth so far.  Patient was changed over to ceftriaxone. Waiting on goals of care conversation.   Hematuria in the setting of indwelling Foley catheter/urethral injury/UTI Seen by urology due to persistent hematuria.  CBI was initiated.  Management per urology.  Patient experiencing bladder spasms.  He is getting B&O suppositories as needed. Seems to be improving.   Per urology note CBI to be tapered down.  Will need to be discharged with Foley catheter.  Renal function is stable.  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 Lovell Sheehan.  Subsequent fall at home with progression to quadriplegia.   Based on conversations with patient and family by previous rounding MD plan is for transition to comfort.  Awaiting palliative care conversation with family and patient regarding goals of care.  Chronic sacral decubitus ulcers POA Wound care on board. Multiple ulcers unstageable. General surgery was consulted., no debridement needed, continue wound care.  Essential hypertension Blood pressure medications on hold due to low BP.  Blood pressure is relatively stable.  Anemia of chronic disease with some evidence of low B12 and borderline iron deficiency/acute blood loss anemia due to hematuria Continue B12 supplementation and iron.  Hemoglobin noted to be lower today.  Likely due to hematuria.  Transfuse 1 unit of blood.  Looks like he was last transfused on 11/30.  Chronic Eliquis use This was started for DVT prophylaxis at the rehab.  Has been discontinued.  Placed on heparin which was stopped due to hematuria.    DVT prophylaxis: Currently SCDs. CODE STATUS: Currently full code Family Communication: No family at bedside.  Family to meet with palliative care today.  Consults  :  N Surg, CCS.  Palliative care  Disposition Plan  : Unclear for now  Status is: Inpatient  Remains inpatient appropriate because:Inpatient level of care appropriate due to severity of illness   Dispo: The patient is from: SNF              Anticipated d/c is to: SNF              Anticipated d/c date is: > 3 days              Patient currently is not medically stable to d/c.    Inpatient Medications  Scheduled Meds: . Chlorhexidine  Gluconate Cloth  6 each Topical Daily  . collagenase   Topical Daily  . cyanocobalamin  1,000 mcg Intramuscular Daily  . docusate sodium  100 mg Oral BID  . ferrous sulfate  325 mg Oral BID WC  .  morphine injection  2 mg Intravenous Once  . oxybutynin  5 mg Oral QHS  . pantoprazole  40 mg Oral QHS  . tamsulosin  0.4 mg Oral Daily   Continuous Infusions: . cefTRIAXone (ROCEPHIN)  IV 1 g (05/26/20 0950)  . sodium chloride irrigation     PRN Meds:.diphenhydrAMINE, morphine injection, [DISCONTINUED] ondansetron **OR** ondansetron (ZOFRAN) IV, opium-belladonna, Resource ThickenUp Clear  Antibiotics  :    Anti-infectives (From admission, onward)   Start     Dose/Rate Route Frequency Ordered Stop   05/24/20 1415  cefTRIAXone (ROCEPHIN) 1 g in sodium chloride 0.9 % 100 mL IVPB        1 g 200 mL/hr over 30 Minutes Intravenous Every 24 hours 05/24/20 1330     05/23/20 1400  ceFEPIme (MAXIPIME) 2 g in sodium chloride 0.9 % 100 mL IVPB  Status:  Discontinued        2 g 200 mL/hr over 30 Minutes Intravenous Every 8 hours 05/23/20 1011 05/24/20 1330   05/22/20 0500  ceFEPIme (MAXIPIME) 2 g in sodium chloride 0.9 % 100 mL IVPB  Status:  Discontinued        2 g 200 mL/hr over 30 Minutes Intravenous Every 12 hours 05/21/20 1614 05/23/20 1011   05/21/20 2015  aztreonam (AZACTAM) 2 g in sodium chloride 0.9 % 100 mL IVPB        2 g 200 mL/hr over 30 Minutes Intravenous  Once 05/21/20 2010 05/21/20 2230  05/21/20 1730  vancomycin (VANCOREADY) IVPB 1750 mg/350 mL        1,750 mg 175 mL/hr over 120 Minutes Intravenous  Once 05/21/20 1722 05/21/20 2148   05/21/20 1545  ceFEPIme (MAXIPIME) 2 g in sodium chloride 0.9 % 100 mL IVPB        2 g 200 mL/hr over 30 Minutes Intravenous  Once 05/21/20 1535 05/21/20 1704       Osvaldo Shipper M.D on 05/26/2020 at 10:04 AM  To page go to [email or web address]   [facility] -  Office  [phone]        Objective:   Vitals:   05/25/20 1251 05/25/20  2136 05/25/20 2140 05/26/20 0642  BP: (!) 92/50 109/66  (!) 111/58  Pulse:  76  62  Resp: 19 20  18   Temp: 98.7 F (37.1 C)  98.5 F (36.9 C) 98.3 F (36.8 C)  TempSrc: Oral  Oral Oral  SpO2: 96% 96%  95%  Weight:      Height:        Wt Readings from Last 3 Encounters:  05/22/20 120.7 kg  05/03/20 84.7 kg  04/22/20 91 kg     Intake/Output Summary (Last 24 hours) at 05/26/2020 1004 Last data filed at 05/26/2020 0641 Gross per 24 hour  Intake 135.84 ml  Output 14/08/2019 ml  Net -11064.16 ml     Physical Exam  General appearance: Awake alert.    Seems to be a little upset.  No distress. Resp: Clear to auscultation bilaterally.  Normal effort Cardio: S1-S2 is normal regular.  No S3-S4.  No rubs murmurs or bruit GI: Abdomen is soft.  Nontender nondistended.  Bowel sounds are present normal.  No masses organomegaly Extremities: No edema.   Neurologic: Known quadriparesis.  No new deficits.      Data Review:    CBC Recent Labs  Lab 05/21/20 1533 05/21/20 1541 05/23/20 0432 05/23/20 0432 05/23/20 2306 05/24/20 0127 05/25/20 0353 05/25/20 0957 05/26/20 0621  WBC 12.4*   < > 8.1  --  10.1 10.6* 16.1*  --  12.4*  HGB 9.4*   < > 7.4*   < > 8.1* 8.9* 8.4* 7.7* 7.2*  HCT 30.9*   < > 23.9*   < > 25.6* 27.4* 25.5* 22.7* 23.9*  PLT 187   < > 150  --  142* 179 175  --  145*  MCV 94.2   < > 94.1  --  93.4 92.6 92.4  --  98.8  MCH 28.7   < > 29.1  --  29.6 30.1 30.4  --  29.8  MCHC 30.4   < > 31.0  --  31.6 32.5 32.9  --  30.1  RDW 15.7*   < > 15.9*  --  15.6* 15.7* 16.2*  --  16.6*  LYMPHSABS 1.1  --  1.4  --   --  1.8 1.4  --  2.1  MONOABS 1.1*  --  1.0  --   --  1.1* 1.5*  --  0.9  EOSABS 0.0  --  0.1  --   --  0.2 0.0  --  0.1  BASOSABS 0.0  --  0.0  --   --  0.1 0.1  --  0.1   < > = values in this interval not displayed.    Recent Labs  Lab 05/21/20 1533 05/21/20 1533 05/21/20 1541 05/21/20 1546 05/21/20 1640 05/21/20 2030 05/22/20 0507 05/23/20 05/25/20  05/24/20 0127 05/25/20 0353 05/26/20 14/03/21  NA 140   < >   < >  --   --   --  137 141 141 142 141  K 4.1   < >   < >  --   --   --  3.7 3.3* 3.7 3.9 3.6  CL 102   < >   < >  --   --   --  103 104 107 107 104  CO2 28   < >  --   --   --   --  25 26 25 24 27   GLUCOSE 174*   < >   < >  --   --   --  124* 84 162* 148* 100*  BUN 25*   < >   < >  --   --   --  24* 23 22 19 17   CREATININE 2.06*   < >   < >  --   --   --  1.50* 1.08 0.99 1.33* 1.06  CALCIUM 8.0*   < >  --   --   --   --  7.9* 8.1* 8.1* 8.1* 8.3*  AST 42*   < >  --   --   --   --  34 46* 52* 36 31  ALT 32   < >  --   --   --   --  27 35 44 37 27  ALKPHOS 83   < >  --   --   --   --  71 67 74 88 80  BILITOT 0.4   < >  --   --   --   --  0.6 0.8 0.9 0.9 1.0  ALBUMIN 1.8*   < >  --   --   --   --  1.8* 1.6* 1.8* 1.9* 1.8*  MG  --   --   --   --   --   --   --  1.9 1.9  --   --   CRP  --   --   --   --   --   --  0.7 0.7 0.6  --   --   PROCALCITON  --   --   --   --   --   --  0.12 0.11 <0.10  --   --   LATICACIDVEN 3.4*  --   --   --  2.7* 2.2*  --   --   --   --   --   INR 1.5*  --   --   --   --   --  1.5*  --   --   --   --   TSH  --   --   --   --  1.839  --   --   --   --   --   --   AMMONIA  --   --   --  21  --   --   --   --   --   --   --   BNP  --   --   --   --   --   --   --  35.1 32.0  --   --    < > = values in this interval not displayed.     Micro Results Recent Results (from the past 240 hour(s))  Urine culture     Status: None   Collection Time:  05/21/20  3:33 PM   Specimen: In/Out Cath Urine  Result Value Ref Range Status   Specimen Description IN/OUT CATH URINE  Final   Special Requests NONE  Final   Culture   Final    NO GROWTH Performed at [facility], [address], [place], [place] [address]    Report Status 05/22/2020 FINAL  Final  Resp Panel by RT-PCR (Flu A&B, Covid) Nasopharyngeal Swab     Status: None   Collection Time: 05/21/20  3:35 PM   Specimen: Nasopharyngeal Swab;  Nasopharyngeal(NP) swabs in vial transport medium  Result Value Ref Range Status   SARS Coronavirus 2 by RT PCR NEGATIVE NEGATIVE Final    Comment: (NOTE) SARS-CoV-2 target nucleic acids are NOT DETECTED.  The SARS-CoV-2 RNA is generally detectable in upper respiratory specimens during the acute phase of infection. The lowest concentration of SARS-CoV-2 viral copies this assay can detect is 138 copies/mL. A negative result does not preclude SARS-Cov-2 infection and should not be used as the sole basis for treatment or other patient management decisions. A negative result may occur with  improper specimen collection/handling, submission of specimen other than nasopharyngeal swab, presence of viral mutation(s) within the areas targeted by this assay, and inadequate number of viral copies(<138 copies/mL). A negative result must be combined with clinical observations, patient history, and epidemiological information. The expected result is Negative.  Fact Sheet for Patients:  BloggerCourse.com  Fact Sheet for Healthcare Providers:  SeriousBroker.it  This test is no t yet approved or cleared by the [place] FDA and  has been authorized for detection and/or diagnosis of SARS-CoV-2 by FDA under an Emergency Use Authorization (EUA). This EUA will remain  in effect (meaning this test can be used) for the duration of the COVID-19 declaration under Section 564(b)(1) of the Act, 21 U.S.C.section 360bbb-3(b)(1), unless the authorization is terminated  or revoked sooner.       Influenza A by PCR NEGATIVE NEGATIVE Final   Influenza B by PCR NEGATIVE NEGATIVE Final    Comment: (NOTE) The Xpert Xpress SARS-CoV-2/FLU/RSV plus assay is intended as an aid in the diagnosis of influenza from Nasopharyngeal swab specimens and should not be used as a sole basis for treatment. Nasal washings and aspirates are unacceptable for Xpert Xpress  SARS-CoV-2/FLU/RSV testing.  Fact Sheet for Patients: BloggerCourse.com  Fact Sheet for Healthcare Providers: SeriousBroker.it  This test is not yet approved or cleared by the [place] FDA and has been authorized for detection and/or diagnosis of SARS-CoV-2 by FDA under an Emergency Use Authorization (EUA). This EUA will remain in effect (meaning this test can be used) for the duration of the COVID-19 declaration under Section 564(b)(1) of the Act, 21 U.S.C. section 360bbb-3(b)(1), unless the authorization is terminated or revoked.  Performed at [facility], [address] [place], [place] [address]   Blood Culture (routine x 2)     Status: None   Collection Time: 05/21/20  4:40 PM   Specimen: BLOOD  Result Value Ref Range Status   Specimen Description BLOOD SITE NOT SPECIFIED  Final   Special Requests   Final    BOTTLES DRAWN AEROBIC ONLY Blood Culture results may not be optimal due to an inadequate volume of blood received in culture bottles   Culture   Final    NO GROWTH 5 DAYS Performed at [facility], [address], [place], [place] [address]    Report Status 05/26/2020 FINAL  Final  Blood Culture (routine x 2)     Status: None   Collection Time: 05/21/20  4:42 PM   Specimen: BLOOD  Result Value Ref Range Status   Specimen Description BLOOD SITE NOT SPECIFIED  Final   Special Requests   Final    BOTTLES DRAWN AEROBIC ONLY Blood Culture results may not be optimal due to an inadequate volume of blood received in culture bottles   Culture   Final    NO GROWTH 5 DAYS Performed at [facility], [address] [place], [place] [address]    Report Status 05/26/2020 FINAL  Final    Radiology Reports DG Cervical Spine 1 View  Result Date: 04/26/2020 CLINICAL DATA:  Status post surgical spinal fusion. EXAM: DG CERVICAL SPINE - 1 VIEW COMPARISON:  Radiographs of the cervical spine 04/22/2020.  FINDINGS: A single lateral view radiograph of the cervical spine is submitted. Redemonstrated sequela of prior C3-C5 ACDF with ventral plate and screw fixation. Redemonstrated interbody device at the C4-C5 level. IMPRESSION: Single lateral view radiograph of the cervical spine as above. Electronically Signed   By: Jackey Loge DO   On: 04/26/2020 14:58   CT Head Wo Contrast  Result Date: 05/21/2020 CLINICAL DATA:  Mental status change. EXAM: CT HEAD WITHOUT CONTRAST TECHNIQUE: Contiguous axial images were obtained from the base of the skull through the vertex without intravenous contrast. COMPARISON:  May 03, 2020 FINDINGS: Brain: No evidence of acute infarction, hemorrhage, hydrocephalus, extra-axial collection or mass lesion/mass effect. Mild brain parenchymal volume loss and deep white matter microangiopathy. Vascular: No hyperdense vessel or unexpected calcification. Skull: Normal. Negative for fracture or focal lesion. Sinuses/Orbits: No acute finding. Other: None. IMPRESSION: 1. No acute intracranial abnormality. 2. 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 Electronically Signed   By: Ted Mcalpine M.D.   On: 05/21/2020 17:53   CT Head Wo Contrast  Result Date: 05/03/2020 CLINICAL DATA:  Facial trauma after multiple falls. EXAM: CT HEAD WITHOUT CONTRAST CT CERVICAL SPINE WITHOUT CONTRAST TECHNIQUE: Multidetector CT imaging of the head and cervical spine was performed following the standard protocol without intravenous contrast. Multiplanar CT image reconstructions of the cervical spine were also generated. COMPARISON:  April 21, 2020. FINDINGS: CT HEAD FINDINGS Brain: Mild chronic ischemic white matter disease is noted. No mass effect or midline shift is noted. Ventricular size is within normal limits. There is no evidence of mass lesion, hemorrhage or acute infarction. Vascular: No hyperdense vessel or unexpected calcification. Skull: Normal. Negative for  fracture or focal lesion. Sinuses/Orbits: No acute finding. Other: None. CT CERVICAL SPINE FINDINGS Alignment: Normal. Skull base and vertebrae: No acute fracture is noted. Postsurgical changes are seen involving the C3, C4 and C5 vertebral bodies. Soft tissues and spinal canal: No prevertebral fluid or swelling. No visible canal hematoma. Disc levels: Status post surgical anterior fusion of C3-4 and C4-5. There is fusion of the C5-6 and C6-7 disc spaces secondary to degenerative change. Upper chest: Negative. Other: None. IMPRESSION: 1. Mild chronic ischemic white matter disease. No acute intracranial abnormality seen. 2. Postsurgical and degenerative changes as described above. No acute abnormality seen in the cervical spine. Electronically Signed   By: Lupita Raider M.D.   On: 05/03/2020 14:41   CT Chest Wo Contrast  Result Date: 05/03/2020 CLINICAL DATA:  Short of breath, fever, sepsis, multiple falls EXAM: CT CHEST WITHOUT CONTRAST TECHNIQUE: Multidetector CT imaging of the chest was performed following the standard protocol without IV contrast. COMPARISON:  05/03/2020 FINDINGS: Cardiovascular: Unenhanced imaging of the heart and great vessels demonstrates trace pericardial fluid. The heart is not enlarged. Aneurysmal dilatation of the ascending thoracic aorta measuring up to 4.4 cm. Evaluation of the vascular lumen is limited without intravenous contrast. Mediastinum/Nodes: No enlarged mediastinal or axillary lymph nodes. Thyroid gland, trachea, and esophagus demonstrate no significant findings. Lungs/Pleura: There is diffuse bronchial wall thickening. Complete opacification of the bronchus intermedius and right middle and right lower lobe bronchi are seen, along with partial opacification of the left lower lobe bronchus. There is patchy airspace disease within the right upper and left lower lobes. Complete consolidation of the right middle and right lower lobes with associated volume loss consistent  with atelectasis. No effusion or pneumothorax. Upper Abdomen: Calcified gallstone is identified. The remainder of the upper abdomen is unremarkable. Musculoskeletal: Severe left shoulder osteoarthritis. No acute or destructive bony lesions. Reconstructed images demonstrate no additional findings. IMPRESSION: 1. Diffuse bronchial wall thickening, with patchy airspace disease of the right upper and left lower lobes, consistent with bronchopneumonia. 2. Opacification of the right middle, right lower, and left lower lobe bronchi, with complete collapse of the right middle and right lower lobes. This may be related to mucous plugging or underlying infection. 3. Trace pericardial fluid. 4. Cholelithiasis without cholecystitis. 5. 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 Recommend annual imaging followup by CTA or MRA. This recommendation follows 2010 ACCF/AHA/AATS/ACR/ASA/SCA/SCAI/SIR/STS/SVM Guidelines for the Diagnosis and Management of Patients with Thoracic Aortic Disease. Circulation. 2010; 121: Z610-R604: E266-e369. Aortic aneurysm NOS (ICD10-I71.9) Electronically Signed   By: Sharlet SalinaMichael  Brown M.D.   On: 05/03/2020 16:31   CT Cervical Spine Wo Contrast  Result Date: 05/03/2020 CLINICAL DATA:  Facial trauma after multiple falls. EXAM: CT HEAD WITHOUT CONTRAST CT CERVICAL SPINE WITHOUT CONTRAST TECHNIQUE: Multidetector CT imaging of the head and cervical spine was performed following the standard protocol without intravenous contrast. Multiplanar CT image reconstructions of the cervical spine were also generated. COMPARISON:  April 21, 2020. FINDINGS: CT HEAD FINDINGS Brain: Mild chronic ischemic white matter disease is noted. No mass effect or midline shift is noted. Ventricular size is within normal limits. There is no evidence of mass lesion, hemorrhage or acute infarction. Vascular: No hyperdense vessel or unexpected calcification. Skull: Normal. Negative for fracture or focal lesion.  Sinuses/Orbits: No acute finding. Other: None. CT CERVICAL SPINE FINDINGS Alignment: Normal. Skull base and vertebrae: No acute fracture is noted. Postsurgical changes are seen involving the C3, C4 and C5 vertebral bodies. Soft tissues and spinal canal: No prevertebral fluid or swelling. No visible canal hematoma. Disc levels: Status post surgical anterior fusion of C3-4 and C4-5. There is fusion of the C5-6 and C6-7 disc spaces secondary to degenerative change. Upper chest: Negative. Other: None. IMPRESSION: 1. Mild chronic ischemic white matter disease. No acute intracranial abnormality seen. 2. Postsurgical and degenerative changes as described above. No acute abnormality seen in the cervical spine. Electronically Signed   By: Lupita RaiderJames  Green Jr M.D.   On: 05/03/2020 14:41   DG Chest Port 1 View  Result Date: 05/21/2020 CLINICAL DATA:  Found unresponsive. EXAM: PORTABLE CHEST 1 VIEW COMPARISON:  May 08, 2020 FINDINGS: There is stable elevation of the right hemidiaphragm. Mild bilateral infrahilar atelectasis is seen. There is no evidence of a pleural effusion or pneumothorax. The heart size and mediastinal contours are within normal limits. The visualized skeletal structures are unremarkable. IMPRESSION: Mild bilateral infrahilar atelectasis. Electronically Signed   By: Demetrius Revelhaddeus  Houston M.D.  On: 05/21/2020 16:34   DG Chest Port 1 View  Result Date: 05/08/2020 CLINICAL DATA:  Mucous plugging EXAM: PORTABLE CHEST 1 VIEW COMPARISON:  Two days ago FINDINGS: Low volume chest with opacity at both lung bases. Air bronchograms seen on the left. No edema, effusion, or pneumothorax. Normal heart size IMPRESSION: Continued volume loss with extensive atelectasis at the bases. Electronically Signed   By: Marnee Spring M.D.   On: 05/08/2020 06:46   DG Chest Port 1 View  Result Date: 05/06/2020 CLINICAL DATA:  Shortness of breath EXAM: PORTABLE CHEST 1 VIEW COMPARISON:  May 05, 2020 FINDINGS:  Persistent right pleural effusion with areas of atelectatic change in the lung bases noted. Heart size and pulmonary vascularity are normal. No adenopathy. No bone lesions. IMPRESSION: Stable right pleural effusion with bibasilar atelectasis. A degree of superimposed infiltrate in the right base cannot be excluded. Stable cardiac silhouette. Electronically Signed   By: Bretta Bang III M.D.   On: 05/06/2020 08:29   DG Chest Port 1 View  Result Date: 05/05/2020 CLINICAL DATA:  Respiratory distress. EXAM: PORTABLE CHEST 1 VIEW COMPARISON:  05/03/2020 FINDINGS: The cardiac silhouette, mediastinal and hilar contours are within normal limits and stable. Moderate-sized right pleural effusion with overlying atelectasis or infiltrate. Patchy left basilar infiltrate. IMPRESSION: Moderate-sized right pleural effusion with overlying atelectasis or infiltrate. Patchy left basilar infiltrate. Electronically Signed   By: Rudie Meyer M.D.   On: 05/05/2020 07:15   DG CHEST PORT 1 VIEW  Result Date: 05/04/2020 CLINICAL DATA:  Respiratory distress EXAM: PORTABLE CHEST 1 VIEW COMPARISON:  05/03/2020 FINDINGS: Unchanged right basilar consolidation. Cardiomediastinal contours and pleural spaces are normal. Left lung is clear. IMPRESSION: Unchanged right basilar consolidation. Electronically Signed   By: Deatra Robinson M.D.   On: 05/04/2020 00:07   DG Chest Port 1 View  Result Date: 05/03/2020 CLINICAL DATA:  Questionable sepsis. EXAM: PORTABLE CHEST 1 VIEW COMPARISON:  01/09/2020. FINDINGS: The heart size and mediastinal contours are within normal limits. Low lung volumes. Left basilar opacities. No visible pleural effusions or pneumothorax. Elevated right hemidiaphragm. No acute osseous abnormality. Bilateral shoulder degenerative change. Partially imaged cervical ACDF. IMPRESSION: Low lung volumes with left basilar opacities, which may represent atelectasis, aspiration and/or pneumonia. Dedicated PA and lateral  radiographs could better characterize if clinically indicated. Electronically Signed   By: Feliberto Harts MD   On: 05/03/2020 13:12   DG Swallowing Func-Speech Pathology  Result Date: 05/04/2020 Objective Swallowing Evaluation: Type of Study: Bedside Swallow Evaluation  Patient Details Name: ZAKIR HENNER MRN: [id number] Date of Birth: [birth year] Today's Date: 05/04/2020 Time: SLP Start Time (ACUTE ONLY): 1044 -SLP Stop Time (ACUTE ONLY): 1103 SLP Time Calculation (min) (ACUTE ONLY): 19 min Past Medical History: Past Medical History: Diagnosis Date . Asthma  . 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 Newell Coral, 1992-->surg at [facility].  Summer 2021->Cervicalgia,arm pain, hand atrophy RUE wkness, hyperreflex (Dr. 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 Hay fever  . Hypertension  . Osteoarthritis of both hands  Past Surgical History: Past Surgical History: Procedure Laterality Date . ANTERIOR CERVICAL DECOMP/DISCECTOMY FUSION N/A 04/05/2020  Procedure: ANTERIOR CERVICAL DECOMPRESSION/DISCECTOMY FUSION, INTERBODY PROSTHESIS, PLATE/SCREWS CERVICAL THREE-CERVICAL FOUR, CERVICAL FOUR- CERVICAL FIVE;  Surgeon: Tressie Stalker, MD;  Location: [facility] OR;  Service: Neurosurgery;  Laterality: N/A; . ANTERIOR CERVICAL DECOMP/DISCECTOMY FUSION N/A 04/22/2020  Procedure: Reexploration of anterior cervical wound  for epidural hematoma;  Surgeon: Donalee Citrin, MD;  Location: [facility] OR;  Service: Neurosurgery;  Laterality: N/A; . BACK SURGERY   . INCISION AND DRAINAGE Left 2012  L hand infection . ROTATOR CUFF REPAIR Right   x 3 . SPINE SURGERY   HPI: 74 year old white male who presented with right lower lobe pneumonia and known history of  Quadriparesis.  He originally had a C3-4 [place] 4-5 anterior cervical disc asked to me with decompression, C3 3-4 and C4-5 interbody arthrodesis with local autograft  bone, anterior cervical plating of C3-C5 on 04/05/2020.  He then went to rehab and was discharged.  On 04/21/2020 the patient presented to the emergency room with progressive bilateral quadriplegia.  Patient went to the operating room and had decompression of a vertebral hematoma.  And been discharged on 11 10/2019 and readmitted on 05/03/2020 for increasing shortness of breath and productive cough.  Chest x-ray demonstrated a new right lower lobe infiltrate with compressive atelectasis, concerning for aspiration pneumonia.  Pt had MBSS during most recent admission on 04/25/2020 with recommendations for puree and thin liquids, advanced to Dys3 prior to discharge  Subjective: alert, cooperative, pleasant, participative. Assessment / Plan / Recommendation CHL IP CLINICAL IMPRESSIONS 05/04/2020 Clinical Impression Pt presents with moderate oropharyngeal dysphagia c/b decreased base of tongue retraction, delayed swallow inititation, incomplete laryngeal closure, reduced pharyngeal perstalsis, decreased UES opening, and diminished sensation. These deficits resulted in silent aspiration of thin liquid and nectar thick liquid by straw prior to the swallow. With nectar thick liquid by cup, only transient penetration was seen. There was significant pharyngeal residue with puree and solid consistencies which was reduced but not fully cleared with liquid wash. There is noticeable prevertebral edema in pharynx which appears to be impacting swallow function, but pt is able to acheive adequate epiglottic deflection. This study represents a marked decline in swallow function from MBSS on 04/25/20; however, from review of images in both studies, amount of edema appears comparable to naked eye without any accurate measurement, or comparing level of magnification during each study. Because of recent ACDF (11/13) and revision (10/29), and presence of cervical collar, SLP intervention options are limited outside of modification of diet.  Pt cannot trial compensatory strategies requiring change to head/neck positioning, and many swallow exercises cannot be executed in this position. Pt may benefit from some tongue strengthening exercises to improve swallow drive and decrease vallecula residue. Recommend initiating full liquid diet with nectar thick liquids, by cup sip only. NO STRAWS. Please crush medications if permissible. Called neice, [name], and shared results of today's study which she will pass on to pt's daughter who will be coming to town tomorrow. SLP Visit Diagnosis -- Attention and concentration deficit following -- Frontal lobe and executive function deficit following -- Impact on safety and function --   CHL IP TREATMENT RECOMMENDATION 05/04/2020 Treatment Recommendations Therapy as outlined in treatment plan below   Prognosis 05/04/2020 Prognosis for Safe Diet Advancement Fair Barriers to Reach Goals Cognitive deficits Barriers/Prognosis Comment -- CHL IP DIET RECOMMENDATION 05/04/2020 SLP Diet Recommendations Nectar thick liquid Liquid Administration via No straw Medication Administration -- Compensations Slow rate;Small sips/bites;Follow solids with liquid Postural Changes Seated upright at 90 degrees;Remain semi-upright after after feeds/meals (Comment)   CHL IP OTHER RECOMMENDATIONS 05/04/2020 Recommended Consults -- Oral Care Recommendations Oral care BID Other Recommendations --   CHL IP FOLLOW UP RECOMMENDATIONS 05/04/2020 Follow up Recommendations (No Data)   CHL IP FREQUENCY AND DURATION 05/04/2020 Speech Therapy Frequency (ACUTE ONLY) min  2x/week Treatment Duration 2 weeks      CHL IP ORAL PHASE 05/04/2020 Oral Phase -- Oral - Pudding Teaspoon -- Oral - Pudding Cup -- Oral - Honey Teaspoon -- Oral - Honey Cup -- Oral - Nectar Teaspoon -- Oral - Nectar Cup WFL Oral - Nectar Straw Premature spillage;Decreased bolus cohesion Oral - Thin Teaspoon -- Oral - Thin Cup Premature spillage Oral - Thin Straw Premature spillage Oral -  Puree WFL Oral - Mech Soft WFL Oral - Regular -- Oral - Multi-Consistency -- Oral - Pill WFL Oral Phase - Comment --  CHL IP PHARYNGEAL PHASE 05/04/2020 Pharyngeal Phase Impaired Pharyngeal- Pudding Teaspoon -- Pharyngeal -- Pharyngeal- Pudding Cup -- Pharyngeal -- Pharyngeal- Honey Teaspoon -- Pharyngeal -- Pharyngeal- Honey Cup -- Pharyngeal -- Pharyngeal- Nectar Teaspoon -- Pharyngeal -- Pharyngeal- Nectar Cup -- Pharyngeal -- Pharyngeal- Nectar Straw -- Pharyngeal -- Pharyngeal- Thin Teaspoon -- Pharyngeal -- Pharyngeal- Thin Cup -- Pharyngeal -- Pharyngeal- Thin Straw -- Pharyngeal -- Pharyngeal- Puree -- Pharyngeal -- Pharyngeal- Mechanical Soft -- Pharyngeal -- Pharyngeal- Regular -- Pharyngeal -- Pharyngeal- Multi-consistency -- Pharyngeal -- Pharyngeal- Pill -- Pharyngeal -- Pharyngeal Comment --  CHL IP CERVICAL ESOPHAGEAL PHASE 05/04/2020 Cervical Esophageal Phase Impaired Pudding Teaspoon -- Pudding Cup -- Honey Teaspoon -- Honey Cup -- Nectar Teaspoon -- Nectar Cup Reduced cricopharyngeal relaxation Nectar Straw Reduced cricopharyngeal relaxation Thin Teaspoon -- Thin Cup Reduced cricopharyngeal relaxation Thin Straw Reduced cricopharyngeal relaxation Puree Reduced cricopharyngeal relaxation Mechanical Soft Reduced cricopharyngeal relaxation Regular -- Multi-consistency -- Pill Reduced cricopharyngeal relaxation Cervical Esophageal Comment trace esophageal retention of contrast Kerrie Pleasure, MA, CCC-SLP Acute Rehabilitation Services Office: [phone] 05/04/2020, 11:59 AM

## 2020-05-26 NOTE — Progress Notes (Signed)
PT Cancellation Note  Patient Details Name: Shannon [name] MRN: [id number] DOB: May 09, 1946   Cancelled Treatment:    Reason Eval/Treat Not Completed: Pain limiting ability to participate.  I spoke with OT and she reports she does not feel [name] can tolerate PT and further mobility this afternoon with me.  She recommended PT hold until after goals of care meeting.  PT to check back on Monday 05/29/20.  Thanks,  Corinna Capra, PT, DPT  Acute Rehabilitation [phone] pager #[phone] office       Shannon [name] Shannon [name] 05/26/2020, 2:48 PM

## 2020-05-26 NOTE — Consult Note (Addendum)
Consultation Note Date: 05/26/2020   Patient Name: Shannon [name]  DOB: May 06, 1946  MRN: [id number]  Age / Sex: 74 y.o., male  PCP: Tammi Sou, MD Referring Physician: Bonnielee Haff, MD  Reason for Consultation: Establishing goals of care  HPI/Patient Profile: 74 y.o. 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 In ED, found to have UTI, dehydration, and hypotensive. CT head unremarkable. Blood and urine cultures negative. Receiving IV antibiotics. Chronic sacral decubitus ulcers, unstageable. General surgery consulted and recommending wound care, no debridement needed. Per previous discussions by triad hospitalist with patient and daughter, considering comfort focused care. Palliative medicine consultation for goals of care.   Clinical Assessment and Goals of Care:  I have reviewed medical records, discussed with care team, and met with patient and daughter Shannon [name]) at bedside to discuss goals of care. Patient is awake, alert, oriented and able to participate in discussion.    I introduced Palliative Medicine as specialized medical care for people living with serious illness. It focuses on providing relief from the symptoms and stress of a serious illness. The goal is to improve quality of life for both the patient and the family.  We discussed a brief life review of the patient. Retired Development worker, community carrier. Shannon [name] is his only child. She lives in [place]. Reviewed events of health decline in the last few months following surgery. Prior to this admission, patient was at [facility].  Discussed events leading up to admission and course of hospitalization including diagnoses, interventions, plan of care. Therapeutic  listening as patient and daughter share frustrations with his significant health decline since the surgery and care he is receiving at nursing facility.   Discussed high risk for recurrent hospitalizations secondary to bed-bound status and dependency on others to care for him, including high risk for worsening bed sores, infection, aspiration, etc.   I attempted to elicit values and goals of care important to the patient and daughter. Mr. Sabedra and his daughter are very spiritual individuals and believe his condition is in God's hands. Only God knows the plan for him. They continue to pray for guidance and improvement.   Daughter seems surprised to hear he will need long-term care at a facility. Explained that he needs 24/7 nursing/aid care and this would be challenging in the home setting since she works full time. They have not started the Medicaid process. Encouraged Medicaid enrollment. Also encouraged daughter reach out to the [place] for resources. Also reassured daughter that SW will be involved with discharge planning. Daughter prefers he NOT return to previous SNF. (Discussed with SW, Barbaraann Rondo).   Advanced directives, concepts specific to code status, artifical feeding and hydration, and rehospitalization were considered and discussed. Explored patient's thoughts on aggressive medical interventions if his condition should decline. He again speaks of his belief that only God knows when this will happen and defers decision-making to his daughter. He  shares that if his daughter becomes overwhelmed with decisions, he would wish for the doctors to make decisions.   Explored [name]'s thoughts on aggressive medical management, explaining that we can do many things to prolong life but not necessarily give better quality of life, especially in her father's condition. [name]'s is firm on her decision for FULL code/FULL scope treatment. If his condition declined, she would wish for resuscitation to be attempted  and for him to be placed on life support machine. She states "trying is better than nothing." Patient again confirms he wishes for Shannon [name] to make these decisions on his behalf.   Reviewed previously documented MOST form from November 2021. Shannon [name] confirms decision for FULL code, FULL scope treatment, antibiotics if indicated, IVF if indicated, feeding tube for a defined trial period. MOST form is upload into patient's EMR.   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 Encouraged Shannon [name] review Hard Choices booklet and encouraged ongoing conversations with tough decisions in the future regarding his goals and wishes.   Shannon [name] is appreciative of the information and interested in outpatient palliative follow-up. Answered questions. PMT contact information given.   **Updated Dr. [place] Pink, RN, and SW    SUMMARY OF RECOMMENDATIONS    Initial [place] discussion with patient and daughter Shannon [name]). Patient does not have a documented living will/POA. Daughter is default HCPOA. During conversation, patient defers decisions regarding life-prolonging measures to [place].   Continue FULL code/FULL scope treatment. Continue PT/OT efforts.   Reviewed previously completed MOST form. Daughter confirms: FULL code/FULL scope treatment, ABX if indicated, IVF if indicated, and feeding tube for a defined trial period.   Encouraged ongoing discussions regarding his goals/wishes. Again, patient defers decisions to daughter. Hard Choices booklet given.  TOC team referral. Daughter prefer he not return to previous SNF. Daughter needs information on Medicaid enrollment. Also encouraged [facility] involvement.   Daughter interested in outpatient palliative referral at SNF.   Spiritual care consult for prayer and support.   Code Status/Advance Care Planning:  Full code  Symptom Management:   Per attending  Palliative  Prophylaxis:   Aspiration, Bowel Regimen, Delirium Protocol, Frequent Pain Assessment, Oral Care and Turn Reposition  Psycho-social/Spiritual:   Desire for further Chaplaincy support: yes  Additional Recommendations: Caregiving  Support/Resources, Compassionate Wean Education and Education on Hospice  Prognosis:   Guarded  Discharge Planning: [place] for rehab with Palliative care service follow-up      Primary Diagnoses: Present on Admission: . Sepsis secondary to UTI ([place]) . Benign essential HTN . Essential hypertension . 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 Pressure injury of skin . Quadriplegia and quadriparesis ([place])   I have reviewed the medical record, interviewed the patient and family, and examined the patient. The following aspects are pertinent.  Past Medical History:  Diagnosis Date  . Asthma   . Chiari I malformation ([place])    with assoc syringomyelia.  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 Sherwood Gambler, 1992-->surg at [facility].  Summer 2021->Cervicalgia,arm pain, hand atrophy RUE wkness, hyperreflex (Dr. 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 Hay fever   . Hypertension   . Osteoarthritis of both hands    Social History   Socioeconomic History  . Marital status: Divorced    Spouse name: Not on file  . Number of children: Not on file  . Years of education:  Not on file  . Highest education level: Not on file  Occupational History  . Not on file  Tobacco Use  . Smoking status: Former Research scientist (life sciences)  . Smokeless tobacco: Never Used  Vaping Use  . Vaping Use: Never used  Substance and Sexual Activity  . Alcohol use: Not Currently  . Drug use: Never  . Sexual activity: Not Currently  Other Topics Concern  . Not on file  Social History Narrative   Divorced, one daughter.   Educ: HS   Occup: retired Therapist, sports carrier.   Veteran: Corporate treasurer.  Last went to [employer].   Tob: former.    Alc: no   Social Determinants of Radio broadcast assistant Strain:   . Difficulty of Paying Living Expenses: Not on file  Food Insecurity:   . Worried About Charity fundraiser in the Last Year: Not on file  . Ran Out of Food in the Last Year: Not on file  Transportation Needs:   . Lack of Transportation (Medical): Not on file  . Lack of Transportation (Non-Medical): Not on file  Physical Activity:   . Days of Exercise per Week: Not on file  . Minutes of Exercise per Session: Not on file  Stress:   . Feeling of Stress : Not on file  Social Connections:   . Frequency of Communication with Friends and Family: Not on file  . Frequency of Social Gatherings with Friends and Family: Not on file  . Attends Religious Services: Not on file  . Active Member of Clubs or Organizations: Not on file  . Attends Archivist Meetings: Not on file  . Marital Status: Not on file   Family History  Problem Relation Age of Onset  . Heart attack Mother   . Heart disease Mother   . High blood pressure Mother   . Alcohol abuse Father   . Cancer Father   . Diabetes Father   . High blood pressure Father    Scheduled Meds: . Chlorhexidine Gluconate Cloth  6 each Topical Daily  . collagenase   Topical Daily  . cyanocobalamin  1,000 mcg Intramuscular Daily  . docusate sodium  100 mg Oral BID  . ferrous sulfate  325 mg Oral BID WC  .  morphine injection  2 mg Intravenous Once  . oxybutynin  5 mg Oral QHS  . pantoprazole  40 mg Oral QHS  . tamsulosin  0.4 mg Oral Daily   Continuous Infusions: . cefTRIAXone (ROCEPHIN)  IV 1 g (05/26/20 0950)  . sodium chloride irrigation     PRN Meds:.diphenhydrAMINE, morphine injection, [DISCONTINUED] ondansetron **OR** ondansetron (ZOFRAN) IV, opium-belladonna, Resource ThickenUp Clear Medications Prior to Admission:  Prior to Admission medications   Medication Sig Start Date End Date Taking? Authorizing Provider  acetaminophen (TYLENOL) 500 MG  tablet Take 1,000 mg by mouth 3 (three) times daily.    Yes [provider]  albuterol (VENTOLIN HFA) 108 (90 Base) MCG/ACT inhaler Inhale 2 puffs into the lungs every morning.   Yes [provider]  ascorbic acid (VITAMIN C) 500 MG tablet Take 1,000 mg by mouth every morning.   Yes [provider]  aspirin 325 MG tablet Take 325 mg by mouth every morning.   Yes [provider]  food thickener (SIMPLYTHICK) POWD Take 2 packets by mouth as needed. Patient taking differently: Take 2 packets by mouth as needed (to thicken liquids).  05/11/20 06/10/20 Yes Arrien, Jimmy Picket, MD  guaiFENesin [facility])  600 MG 12 hr tablet Take 600 mg by mouth every morning.   Yes [provider]  pantoprazole (PROTONIX) 40 MG tablet Take 1 tablet (40 mg total) by mouth at bedtime. 04/19/20  Yes Love, Ivan Anchors, PA-C  polyethylene glycol (MIRALAX / GLYCOLAX) 17 g packet Take 17 g by mouth every morning. Mix in 6 oz water and drink   Yes [provider]  tamsulosin (FLOMAX) 0.4 MG CAPS capsule Take 1 capsule (0.4 mg total) by mouth daily. 04/28/20  Yes Newman Pies, MD  ibuprofen (ADVIL) 600 MG tablet Take 1 tablet (600 mg total) by mouth every 6 (six) hours as needed for fever. Patient not taking: Reported on 05/22/2020 05/11/20   Arrien, Jimmy Picket, MD   Allergies  Allergen Reactions  . Iodine Swelling  . Penicillins Swelling    ** tolerates cephalosporins Facial swelling, itchy throat   Review of Systems  All other systems reviewed and are negative.  Physical Exam Vitals and nursing note reviewed.  Constitutional:      General: He is awake.  HENT:     Head: Normocephalic and atraumatic.  Pulmonary:     Effort: No tachypnea, accessory muscle usage or respiratory distress.  Skin:    General: Skin is warm and dry.  Neurological:     Mental Status: He is alert and oriented to person, place, and time.  Psychiatric:        Mood and Affect:  Mood normal.        Speech: Speech normal.        Behavior: Behavior normal.        Cognition and Memory: Cognition normal.    Vital Signs: BP (!) 115/50 (BP Location: Left Arm)   Pulse 79   Temp 98.1 F (36.7 C) (Oral)   Resp 16   Ht 6' (1.829 m)   Wt 120.7 kg   SpO2 94%   BMI 36.09 kg/m  Pain Scale: 0-10   Pain Score: 0-No pain   SpO2: SpO2: 94 % O2 Device:SpO2: 94 % O2 Flow Rate: .   IO: Intake/output summary:   Intake/Output Summary (Last 24 hours) at 05/26/2020 1714 Last data filed at 05/26/2020 1712 Gross per 24 hour  Intake 350 ml  Output 13500 ml  Net -13150 ml    LBM: Last BM Date: 05/24/20 Baseline Weight: Weight: 84.7 kg Most recent weight: Weight: 120.7 kg     Palliative Assessment/Data: PPS 30%   Flowsheet Rows     Most Recent Value  Intake Tab  Referral Department Hospitalist  Unit at Time of Referral Med/Surg Unit  Palliative Care Primary Diagnosis Other (Comment)  Palliative Care Type New Palliative care  Reason for referral Clarify Goals of Care  Date first seen by Palliative Care 05/25/20  Clinical Assessment  Palliative Performance Scale Score 30%  Psychosocial & Spiritual Assessment  Palliative Care Outcomes  Patient/Family meeting held? Yes  Who was at the meeting? patient and daughter  Palliative Care Outcomes Clarified goals of care, Provided psychosocial or spiritual support, Linked to palliative care logitudinal support, ACP counseling assistance       Time Total: 17mn Greater than 50%  of this time was spent counseling and coordinating care related to the above assessment and plan.  Signed by:  MIhor Dow DNP, FNP-C Palliative Medicine Team  Phone: [phone]: [phone]  Please contact Palliative Medicine Team phone at [phone] questions and concerns.  For individual provider: See AShea Evans

## 2020-05-26 NOTE — Progress Notes (Signed)
Urology Inpatient Progress Report  Sepsis secondary to UTI (HCC) [A41.9, N39.0] Sepsis with encephalopathy and septic shock, due to unspecified organism (HCC) [A41.9, R65.21, G93.40]  Intv/Subj: Initially after 84F foley placed the patient had lots of spasms.  The nurse deflated the balloon, pushed the foley back into his bladder and instilled 20 cc of sterile water into balloon.  Treating spasms with B&Os.  Over the last 24 hours the patient has had minimal spasms and clots - has been receiving B&O suppositories regularly. CBI is on a slow gtt, urine remains burgundy.  Principal Problem:   Sepsis secondary to UTI [facility]) Active Problems:   Benign essential HTN   Quadriplegia and quadriparesis (HCC)   Essential hypertension   Myelopathy (HCC)   Chronic indwelling Foley catheter   Pressure injury of skin  Current Facility-Administered Medications  Medication Dose Route Frequency Provider Last Rate Last Admin  . cefTRIAXone (ROCEPHIN) 1 g in sodium chloride 0.9 % 100 mL IVPB  1 g Intravenous Q24H Osvaldo Shipper, MD   Stopped at 05/25/20 1523  . Chlorhexidine Gluconate Cloth 2 % PADS 6 each  6 each Topical Daily Leroy Sea, MD   6 each at 05/25/20 0831  . collagenase (SANTYL) ointment   Topical Daily Leroy Sea, MD   Given at 05/25/20 0831  . cyanocobalamin ((VITAMIN B-12)) injection 1,000 mcg  1,000 mcg Intramuscular Daily Leroy Sea, MD   1,000 mcg at 05/25/20 0831  . diphenhydrAMINE (BENADRYL) injection 25 mg  25 mg Intravenous Q6H PRN Leroy Sea, MD   25 mg at 05/23/20 1756  . docusate sodium (COLACE) capsule 100 mg  100 mg Oral BID Leroy Sea, MD   100 mg at 05/25/20 2039  . ferrous sulfate tablet 325 mg  325 mg Oral BID WC Leroy Sea, MD   325 mg at 05/25/20 1605  . morphine 2 MG/ML injection 2 mg  2 mg Intravenous Q4H PRN Leroy Sea, MD   2 mg at 05/25/20 0419  . morphine 2 MG/ML injection 2 mg  2 mg Intravenous Once Osvaldo Shipper,  MD      . ondansetron [facility]) injection 4 mg  4 mg Intravenous Q6H PRN Rometta Emery, MD      . opium-belladonna (B&O) suppository 16.2-60mg   1 suppository Rectal Q8H PRN Crist Fat, MD   1 suppository at 05/25/20 1605  . oxybutynin (DITROPAN-XL) 24 hr tablet 5 mg  5 mg Oral QHS Zierle-Ghosh, Asia B, DO   5 mg at 05/25/20 2039  . pantoprazole (PROTONIX) EC tablet 40 mg  40 mg Oral QHS Leroy Sea, MD   40 mg at 05/25/20 2039  . Resource ThickenUp Clear   Oral PRN Leroy Sea, MD      . sodium chloride irrigation 0.9 % 3,000 mL  3,000 mL Irrigation Continuous Leroy Sea, MD   3,000 mL at 05/25/20 0831  . tamsulosin (FLOMAX) capsule 0.4 mg  0.4 mg Oral Daily Susa Raring K, MD   0.4 mg at 05/25/20 0830     Objective: Vital: Vitals:   05/25/20 1246 05/25/20 1251 05/25/20 2136 05/25/20 2140  BP: (!) 92/50 (!) 92/50 109/66   Pulse: 100  76   Resp: 19 19 20    Temp: 98.7 F (37.1 C) 98.7 F (37.1 C)  98.5 F (36.9 C)  TempSrc:  Oral  Oral  SpO2: 96% 96% 96%   Weight:      Height:  I/Os: I/O last 3 completed shifts: In: 3979.9 [P.O.:480; Other:3200; IV Piggyback:299.9] Out: 7750 [Urine:7750]  Physical Exam:  General: Patient is in no apparent distress Lungs: Normal respiratory effort, chest expands symmetrically. AbThe abdomen is soft and nontender without mass. Foley: 34F 3way on slow CBI gtt  Ext: lower extremities symmetric  Lab Results: Recent Labs    05/23/20 2306 05/23/20 2306 05/24/20 0127 05/25/20 0353 05/25/20 0957  WBC 10.1  --  10.6* 16.1*  --   HGB 8.1*   < > 8.9* 8.4* 7.7*  HCT 25.6*   < > 27.4* 25.5* 22.7*   < > = values in this interval not displayed.   Recent Labs    05/24/20 0127 05/25/20 0353  NA 141 142  K 3.7 3.9  CL 107 107  CO2 25 24  GLUCOSE 162* 148*  BUN 22 19  CREATININE 0.99 1.33*  CALCIUM 8.1* 8.1*   No results for input(s): LABPT, INR in the last 72 hours. No results for input(s): LABURIN  in the last 72 hours. Results for orders placed or performed during the hospital encounter of 05/21/20  Urine culture     Status: None   Collection Time: 05/21/20  3:33 PM   Specimen: In/Out Cath Urine  Result Value Ref Range Status   Specimen Description IN/OUT CATH URINE  Final   Special Requests NONE  Final   Culture   Final    NO GROWTH Performed at [facility], [address] [place], [place] [address]    Report Status 05/22/2020 FINAL  Final  Resp Panel by RT-PCR (Flu A&B, Covid) Nasopharyngeal Swab     Status: None   Collection Time: 05/21/20  3:35 PM   Specimen: Nasopharyngeal Swab; Nasopharyngeal(NP) swabs in vial transport medium  Result Value Ref Range Status   SARS Coronavirus 2 by RT PCR NEGATIVE NEGATIVE Final    Comment: (NOTE) SARS-CoV-2 target nucleic acids are NOT DETECTED.  The SARS-CoV-2 RNA is generally detectable in upper respiratory specimens during the acute phase of infection. The lowest concentration of SARS-CoV-2 viral copies this assay can detect is 138 copies/mL. A negative result does not preclude SARS-Cov-2 infection and should not be used as the sole basis for treatment or other patient management decisions. A negative result may occur with  improper specimen collection/handling, submission of specimen other than nasopharyngeal swab, presence of viral mutation(s) within the areas targeted by this assay, and inadequate number of viral copies(<138 copies/mL). A negative result must be combined with clinical observations, patient history, and epidemiological information. The expected result is Negative.  Fact Sheet for Patients:  BloggerCourse.com  Fact Sheet for Healthcare Providers:  SeriousBroker.it  This test is no t yet approved or cleared by the [place] FDA and  has been authorized for detection and/or diagnosis of SARS-CoV-2 by FDA under an Emergency Use Authorization (EUA).  This EUA will remain  in effect (meaning this test can be used) for the duration of the COVID-19 declaration under Section 564(b)(1) of the Act, 21 U.S.C.section 360bbb-3(b)(1), unless the authorization is terminated  or revoked sooner.       Influenza A by PCR NEGATIVE NEGATIVE Final   Influenza B by PCR NEGATIVE NEGATIVE Final    Comment: (NOTE) The Xpert Xpress SARS-CoV-2/FLU/RSV plus assay is intended as an aid in the diagnosis of influenza from Nasopharyngeal swab specimens and should not be used as a sole basis for treatment. Nasal washings and aspirates are unacceptable for Xpert Xpress SARS-CoV-2/FLU/RSV testing.  Fact Sheet for Patients: BloggerCourse.com  Fact Sheet for Healthcare Providers: SeriousBroker.it  This test is not yet approved or cleared by the [place] FDA and has been authorized for detection and/or diagnosis of SARS-CoV-2 by FDA under an Emergency Use Authorization (EUA). This EUA will remain in effect (meaning this test can be used) for the duration of the COVID-19 declaration under Section 564(b)(1) of the Act, 21 U.S.C. section 360bbb-3(b)(1), unless the authorization is terminated or revoked.  Performed at [facility], [address], [place], [place] [address]   Blood Culture (routine x 2)     Status: None (Preliminary result)   Collection Time: 05/21/20  4:40 PM   Specimen: BLOOD  Result Value Ref Range Status   Specimen Description BLOOD SITE NOT SPECIFIED  Final   Special Requests   Final    BOTTLES DRAWN AEROBIC ONLY Blood Culture results may not be optimal due to an inadequate volume of blood received in culture bottles   Culture   Final    NO GROWTH 4 DAYS Performed at [facility], [address], [place], [place] [address]    Report Status PENDING  Incomplete  Blood Culture (routine x 2)     Status: None (Preliminary result)   Collection Time: 05/21/20  4:42 PM    Specimen: BLOOD  Result Value Ref Range Status   Specimen Description BLOOD SITE NOT SPECIFIED  Final   Special Requests   Final    BOTTLES DRAWN AEROBIC ONLY Blood Culture results may not be optimal due to an inadequate volume of blood received in culture bottles   Culture   Final    NO GROWTH 4 DAYS Performed at [facility], [address] [place], [place] [address]    Report Status PENDING  Incomplete    Studies/Results: No results found.  Assessment: Traumatic foley - resulting in hematuria.  Now with large catheter and on CBI and slowly improving.  Plan: Continue to treat infection.  Would keep B&O suppositories until bleeding clears. Wean CBI to off as able.  Wean CBI to off - titrate down so that outflow is thin/pinkish.  Can cap in-flow port once his hematuria resolves.  Exchange the catheter as scheduled by SNF.  Will see PRN at this point given things seem to have settled and are now improving.   Berniece Salines, MD Urology 05/26/2020, 6:39 AM

## 2020-05-26 NOTE — Progress Notes (Signed)
Physical Therapy Wound Treatment Patient Details  Name: Caeden Foots MRN: [id number] Date of Birth: [birth year]  Today's Date: 05/26/2020 Time: 9432-7614 Time Calculation (min): 34 min  Subjective  Subjective: Pt pleasant and agreeable to hydro Patient and Family Stated Goals: Heal wound Prior Treatments: Dressing changes  Pain Score:  Pt with grimacing with pulsatile lavage. Premedicated.   Wound Assessment  Pressure Injury 05/23/20 Sacrum Unstageable - Full thickness tissue loss in which the base of the injury is covered by slough (yellow, tan, gray, green or brown) and/or eschar (tan, brown or black) in the wound bed. (Active)  Dressing Type ABD;Barrier Film (skin prep);Gauze (Comment) 05/26/20 1152  Dressing Changed;Clean;Dry;Intact 05/26/20 1152  Dressing Change Frequency Daily 05/26/20 1152  State of Healing Eschar 05/26/20 1152  Site / Wound Assessment Pink;Yellow;Black;Brown 05/26/20 1152  % Wound base Red or Granulating 30% 05/26/20 1152  % Wound base Yellow/Fibrinous Exudate 60% 05/26/20 1152  % Wound base Black/Eschar 10% 05/26/20 1152  % Wound base Other/Granulation Tissue (Comment) 0% 05/26/20 1152  Peri-wound Assessment Intact;Pink 05/26/20 1152  Wound Length (cm) 11 cm 05/25/20 1425  Wound Width (cm) 9.5 cm 05/25/20 1425  Wound Depth (cm) 2.5 cm 05/25/20 1425  Wound Surface Area (cm^2) 104.5 cm^2 05/25/20 1425  Wound Volume (cm^3) 261.25 cm^3 05/25/20 1425  Margins Unattached edges (unapproximated) 05/26/20 1152  Drainage Amount Minimal 05/26/20 1152  Drainage Description Serosanguineous 05/26/20 1152  Treatment Debridement (Selective);Hydrotherapy (Pulse lavage);Packing (Saline gauze) 05/26/20 1152  Santyl applied to wound bed prior to applying dressing.  Hydrotherapy Pulsed lavage therapy - wound location: Sacrum Pulsed Lavage with Suction (psi): 8 psi (4-8) Pulsed Lavage with Suction - Normal Saline Used: 1000 mL Pulsed Lavage Tip: Tip with splash  shield Selective Debridement Selective Debridement - Location: Sacrum Selective Debridement - Tools Used: Forceps;Scissors Selective Debridement - Tissue Removed: yellow slough   Wound Assessment and Plan  Wound Therapy - Assess/Plan/Recommendations Wound Therapy - Clinical Statement: Slow progress with debridement of wound.  Will continue to follow for selective removal of unviable tissue, to decrease bioburden, and promote wound bed healing. Wound Therapy - Functional Problem List: Decreased mobility and global weakness Factors Delaying/Impairing Wound Healing: Immobility Hydrotherapy Plan: Debridement;Dressing change;Patient/family education;Pulsatile lavage with suction Wound Therapy - Frequency: 6X / week Wound Therapy - Follow Up Recommendations: Skilled nursing facility Wound Plan: See above  Wound Therapy Goals- Improve the function of patient's integumentary system by progressing the wound(s) through the phases of wound healing (inflammation - proliferation - remodeling) by: Decrease Necrotic Tissue to: 20% Decrease Necrotic Tissue - Progress: Progressing toward goal Increase Granulation Tissue to: 80% Increase Granulation Tissue - Progress: Progressing toward goal Time For Goal Achievement: 7 days Wound Therapy - Potential for Goals: Fair  Goals will be updated until maximal potential achieved or discharge criteria met.  Discharge criteria: when goals achieved, discharge from hospital, MD decision/surgical intervention, no progress towards goals, refusal/missing three consecutive treatments without notification or medical reason.  GP     Shary Decamp Maycok 05/26/2020, 12:01 PM [place] Pager [phone] Office [phone]

## 2020-05-26 NOTE — Care Management Important Message (Signed)
Important Message  Patient Details  Name: Shannon [name] MRN: [id number] Date of Birth: 06/19/1946   Medicare Important Message Given:  Yes   Due to staffing called the patient room no answer signed IM mailed to the patient home address.   Harvy Riera 05/26/2020, 1:47 PM

## 2020-05-27 DIAGNOSIS — I10 Essential (primary) hypertension: Secondary | ICD-10-CM | POA: Diagnosis not present

## 2020-05-27 DIAGNOSIS — E876 Hypokalemia: Secondary | ICD-10-CM | POA: Diagnosis not present

## 2020-05-27 DIAGNOSIS — D62 Acute posthemorrhagic anemia: Secondary | ICD-10-CM | POA: Diagnosis not present

## 2020-05-27 LAB — BPAM RBC
Blood Product Expiration Date: 202112312359
ISSUE DATE / TIME: 202112031417
Unit Type and Rh: 6200

## 2020-05-27 LAB — CBC
HCT: 23.4 % — ABNORMAL LOW (ref 39.0–52.0)
Hemoglobin: 7.5 g/dL — ABNORMAL LOW (ref 13.0–17.0)
MCH: 29.9 pg (ref 26.0–34.0)
MCHC: 32.1 g/dL (ref 30.0–36.0)
MCV: 93.2 fL (ref 80.0–100.0)
Platelets: 124 10*3/uL — ABNORMAL LOW (ref 150–400)
RBC: 2.51 MIL/uL — ABNORMAL LOW (ref 4.22–5.81)
RDW: 16.6 % — ABNORMAL HIGH (ref 11.5–15.5)
WBC: 11.4 10*3/uL — ABNORMAL HIGH (ref 4.0–10.5)
nRBC: 0.4 % — ABNORMAL HIGH (ref 0.0–0.2)

## 2020-05-27 LAB — TYPE AND SCREEN
ABO/RH(D): A POS
Antibody Screen: NEGATIVE
Unit division: 0

## 2020-05-27 LAB — BASIC METABOLIC PANEL
Anion gap: 7 (ref 5–15)
BUN: 14 mg/dL (ref 8–23)
CO2: 26 mmol/L (ref 22–32)
Calcium: 7.8 mg/dL — ABNORMAL LOW (ref 8.9–10.3)
Chloride: 108 mmol/L (ref 98–111)
Creatinine, Ser: 0.86 mg/dL (ref 0.61–1.24)
GFR, Estimated: >60 mL/min (ref >60)
Glucose, Bld: 102 mg/dL — ABNORMAL HIGH (ref 70–99)
Potassium: 3.1 mmol/L — ABNORMAL LOW (ref 3.5–5.1)
Sodium: 141 mmol/L (ref 135–145)

## 2020-05-27 MED ORDER — POTASSIUM CHLORIDE CRYS ER 20 MEQ PO TBCR
40.0000 meq | EXTENDED_RELEASE_TABLET | ORAL | Status: AC
  Administered 2020-05-27 (×2): 40 meq via ORAL
  Filled 2020-05-27 (×2): qty 2

## 2020-05-27 NOTE — Progress Notes (Signed)
Physical Therapy Wound Treatment Patient Details  Name: Shannon [name] MRN: [id number] Date of Birth: 05/20/1946  Today's Date: 05/27/2020 Time: 0830-0908 Time Calculation (min): 38 min  Subjective  Subjective: Pt pleasant and agreeable to hydro Patient and Family Stated Goals: Heal wound Prior Treatments: Dressing changes  Pain Score:  Grimacing with pulsatile lavage.   Wound Assessment  Pressure Injury 05/23/20 Sacrum Unstageable - Full thickness tissue loss in which the base of the injury is covered by slough (yellow, tan, gray, green or brown) and/or eschar (tan, brown or black) in the wound bed. (Active)  Dressing Type Barrier Film (skin prep);Gauze (Comment);Moist to moist;ABD 05/27/20 0943  Dressing Changed;Clean;Dry;Intact 05/27/20 0943  Dressing Change Frequency Daily 05/27/20 0943  State of Healing Eschar 05/27/20 0943  Site / Wound Assessment Pink;Yellow;Brown 05/27/20 0943  % Wound base Red or Granulating 30% 05/27/20 0943  % Wound base Yellow/Fibrinous Exudate 60% 05/27/20 0943  % Wound base Black/Eschar 10% 05/27/20 0943  % Wound base Other/Granulation Tissue (Comment) 0% 05/27/20 0943  Peri-wound Assessment Intact;Pink 05/27/20 0943  Wound Length (cm) 11 cm 05/25/20 1425  Wound Width (cm) 9.5 cm 05/25/20 1425  Wound Depth (cm) 2.5 cm 05/25/20 1425  Wound Surface Area (cm^2) 104.5 cm^2 05/25/20 1425  Wound Volume (cm^3) 261.25 cm^3 05/25/20 1425  Margins Unattached edges (unapproximated) 05/27/20 0943  Drainage Amount Minimal 05/27/20 0943  Drainage Description Serous 05/27/20 0943  Treatment Debridement (Selective);Hydrotherapy (Pulse lavage);Packing (Saline gauze) 05/27/20 0943  Santyl applied to wound bed prior to applying dressing.  Hydrotherapy Pulsed lavage therapy - wound location: Sacrum Pulsed Lavage with Suction (psi): 8 psi (4-8) Pulsed Lavage with Suction - Normal Saline Used: 1000 mL Pulsed Lavage Tip: Tip with splash shield Selective  Debridement Selective Debridement - Location: Sacrum Selective Debridement - Tools Used: Forceps;Scissors Selective Debridement - Tissue Removed: yellow slough   Wound Assessment and Plan  Wound Therapy - Assess/Plan/Recommendations Wound Therapy - Clinical Statement: Slow progress with debridement of wound.  Will continue to follow for selective removal of unviable tissue, to decrease bioburden, and promote wound bed healing. Wound Therapy - Functional Problem List: Decreased mobility and global weakness Factors Delaying/Impairing Wound Healing: Immobility Hydrotherapy Plan: Debridement;Dressing change;Patient/family education;Pulsatile lavage with suction Wound Therapy - Frequency: 6X / week Wound Therapy - Follow Up Recommendations: Skilled nursing facility Wound Plan: See above  Wound Therapy Goals- Improve the function of patient's integumentary system by progressing the wound(s) through the phases of wound healing (inflammation - proliferation - remodeling) by: Decrease Necrotic Tissue to: 20% Decrease Necrotic Tissue - Progress: Progressing toward goal Increase Granulation Tissue to: 80% Increase Granulation Tissue - Progress: Progressing toward goal  Goals will be updated until maximal potential achieved or discharge criteria met.  Discharge criteria: when goals achieved, discharge from hospital, MD decision/surgical intervention, no progress towards goals, refusal/missing three consecutive treatments without notification or medical reason.  GP      W Maycok 05/27/2020, 9:46 AM   PT Acute Rehabilitation Services Pager [phone] Office [phone]   

## 2020-05-27 NOTE — Progress Notes (Addendum)
PROGRESS NOTE                                                                                                                                                                                                             Patient Demographics:    Shannon [name], is a 74 y.o. male, DOB - [birth year], GYB:[id number]  Outpatient Primary MD for the patient is McGowen, Adrian Blackwater, MD    LOS - 6  Admit date - 05/21/2020    Chief Complaint  Patient presents with  . Unresponsive       Brief Narrative (HPI from H&P) - Shannon [name] is a 74 y.o. 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  Patient had anterior approach for the repair.  He then went home and had a fall from his bed, he was then hospitalized and from here he was sent to a nursing home, he has been quadriplegic since then, he has also developed sacral decubitus ulcers.   In the ER this admission he was found to have UTI, dehydration with found hypotension.  A Foley catheter was placed in the ER however Foley did not yield any urine output but frank blood.       Subjective:   Patient complains of occasional abdominal pain though none currently.  No nausea vomiting.     Assessment  & Plan :     Acute metabolic encephalopathy due to UTI, sepsis and hypotension Head CT was unremarkable.  Patient was treated with IV antibiotics. Blood and urine cultures without any growth so far.  Mentation seems to be back to baseline.  No focal deficits.  Hematuria in the setting of indwelling Foley catheter/urethral injury/UTI Seen by urology due to persistent hematuria.  CBI was initiated.  Patient experiencing bladder spasms.  He is getting B&O suppositories as needed. Per urology note from yesterday plan was to wean CBI to off.  Patient continues to be on CBI currently.  Hematuria  seems to be subsiding.  Further management per urology.   Renal function is stable.  He remains on ceftriaxone for now.  Cultures have been negative so far.  Plan will be to transition to oral antibacterials once he is off of CBI.  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 Arnoldo Morale.  Subsequent fall at home with progression to quadriplegia.   Previous rounding MD had some discussions with the patient during which the patient hinted that he may want to pursue comfort care.  However based on discussions that palliative care had with patient and patient's daughter plan is to continue full scope of care.    Chronic sacral decubitus ulcers POA Wound care on board. Multiple ulcers unstageable. General surgery was consulted., no debridement needed, continue wound care.  Essential hypertension/hypokalemia Blood pressure medications on hold due to low BP.  Blood pressure is relatively stable. Replace potassium.  Will check magnesium tomorrow.  Anemia of chronic disease with some evidence of low B12 and borderline iron deficiency/acute blood loss anemia due to hematuria Continue B12 supplementation and iron.   Hemoglobin noted to be 7.2 yesterday.  He was transfused 1 unit on 12/3.  Transfuse also on 11/30.  Marginally improved to 7.5.  Hematuria is likely contributing to his drop in hemoglobin.  Hold off on transfusion today.  Will check hemoglobin tomorrow.    Chronic Eliquis use This was started for DVT prophylaxis at the rehab.  Has been discontinued.  Placed on heparin which was stopped due to hematuria.   Goals of care Palliative care met with patient and family yesterday.  Plan is to continue full scope of care.  DVT prophylaxis: Currently SCDs. CODE STATUS: Currently full code Family Communication: No family at bedside.   Consults  :  N Surg,  CCS.  Palliative care  Disposition Plan  : Looks like he will need to go to SNF based on OT and PT recommendations.  Status is: Inpatient  Remains inpatient appropriate because:Inpatient level of care appropriate due to severity of illness   Dispo: The patient is from: SNF              Anticipated d/c is to: SNF              Anticipated d/c date is: > 3 days              Patient currently is not medically stable to d/c.    Inpatient Medications  Scheduled Meds: . Chlorhexidine Gluconate Cloth  6 each Topical Daily  . collagenase   Topical Daily  . cyanocobalamin  1,000 mcg Intramuscular Daily  . docusate sodium  100 mg Oral BID  . ferrous sulfate  325 mg Oral BID WC  .  morphine injection  2 mg Intravenous Once  . oxybutynin  5 mg Oral QHS  . pantoprazole  40 mg Oral QHS  . potassium chloride  40 mEq Oral Q4H  . tamsulosin  0.4 mg Oral Daily   Continuous Infusions: . cefTRIAXone (ROCEPHIN)  IV 1 g (05/26/20 0950)  . sodium chloride irrigation     PRN Meds:.diphenhydrAMINE, morphine injection, [DISCONTINUED] ondansetron **OR** ondansetron (ZOFRAN) IV, opium-belladonna, Resource ThickenUp Clear  Antibiotics  :    Anti-infectives (From admission, onward)   Start     Dose/Rate Route Frequency Ordered Stop   05/24/20 1415  cefTRIAXone (ROCEPHIN) 1 g in sodium chloride 0.9 % 100 mL IVPB        1 g 200 mL/hr over 30 Minutes Intravenous Every 24 hours 05/24/20 1330     05/23/20 1400  ceFEPIme (MAXIPIME) 2 g in sodium chloride 0.9 % 100 mL IVPB  Status:  Discontinued  2 g 200 mL/hr over 30 Minutes Intravenous Every 8 hours 05/23/20 1011 05/24/20 1330   05/22/20 0500  ceFEPIme (MAXIPIME) 2 g in sodium chloride 0.9 % 100 mL IVPB  Status:  Discontinued        2 g 200 mL/hr over 30 Minutes Intravenous Every 12 hours 05/21/20 1614 05/23/20 1011   05/21/20 2015  aztreonam (AZACTAM) 2 g in sodium chloride 0.9 % 100 mL IVPB        2 g 200 mL/hr over 30 Minutes Intravenous   Once 05/21/20 2010 05/21/20 2230   05/21/20 1730  vancomycin (VANCOREADY) IVPB 1750 mg/350 mL        1,750 mg 175 mL/hr over 120 Minutes Intravenous  Once 05/21/20 1722 05/21/20 2148   05/21/20 1545  ceFEPIme (MAXIPIME) 2 g in sodium chloride 0.9 % 100 mL IVPB        2 g 200 mL/hr over 30 Minutes Intravenous  Once 05/21/20 1535 05/21/20 1704       Bonnielee Haff M.D on 05/27/2020 at 9:20 AM  To page go to [email or web address]   [facility] -  Office  [phone]        Objective:   Vitals:   05/26/20 1620 05/26/20 1715 05/26/20 2027 05/27/20 0500  BP: (!) 115/50 (!) 105/55 (!) 109/55 (!) 117/58  Pulse: 79 84 84 78  Resp: 16 14 16 16   Temp: 98.1 F (36.7 C) 98.9 F (37.2 C) 98.2 F (36.8 C) 99.1 F (37.3 C)  TempSrc: Oral Oral Oral Oral  SpO2:  92% 96% 96%  Weight:      Height:        Wt Readings from Last 3 Encounters:  05/22/20 120.7 kg  05/03/20 84.7 kg  04/22/20 91 kg     Intake/Output Summary (Last 24 hours) at 05/27/2020 0920 Last data filed at 05/27/2020 0911 Gross per 24 hour  Intake 2350 ml  Output 21050 ml  Net -18700 ml     Physical Exam  General appearance: Awake alert.  In no distress Resp: Clear to auscultation bilaterally.  Normal effort Cardio: S1-S2 is normal regular.  No S3-S4.  No rubs murmurs or bruit GI: Abdomen is soft.  Mildly tender in the suprapubic area without any rebound or guarding.  No masses organomegaly.  Extremities: No edema.   Neurologic: Known quadriparesis.  No other focal deficits       Data Review:    CBC Recent Labs  Lab 05/21/20 1533 05/21/20 1541 05/23/20 0432 05/23/20 0432 05/23/20 2306 05/23/20 2306 05/24/20 0127 05/25/20 0353 05/25/20 0957 05/26/20 0621 05/27/20 0117  WBC 12.4*   < > 8.1   < > 10.1  --  10.6* 16.1*  --  12.4* 11.4*  HGB 9.4*   < > 7.4*   < > 8.1*   < > 8.9* 8.4* 7.7* 7.2* 7.5*  HCT 30.9*   < > 23.9*   < > 25.6*   < > 27.4* 25.5* 22.7* 23.9* 23.4*  PLT 187   < > 150   < >  142*  --  179 175  --  145* 124*  MCV 94.2   < > 94.1   < > 93.4  --  92.6 92.4  --  98.8 93.2  MCH 28.7   < > 29.1   < > 29.6  --  30.1 30.4  --  29.8 29.9  MCHC 30.4   < > 31.0   < > 31.6  --  32.5 32.9  --  30.1 32.1  RDW 15.7*   < > 15.9*   < > 15.6*  --  15.7* 16.2*  --  16.6* 16.6*  LYMPHSABS 1.1  --  1.4  --   --   --  1.8 1.4  --  2.1  --   MONOABS 1.1*  --  1.0  --   --   --  1.1* 1.5*  --  0.9  --   EOSABS 0.0  --  0.1  --   --   --  0.2 0.0  --  0.1  --   BASOSABS 0.0  --  0.0  --   --   --  0.1 0.1  --  0.1  --    < > = values in this interval not displayed.    Recent Labs  Lab 05/21/20 1533 05/21/20 1533 05/21/20 1541 05/21/20 1546 05/21/20 1640 05/21/20 2030 05/22/20 0507 05/22/20 0507 05/23/20 0432 05/24/20 0127 05/25/20 0353 05/26/20 0621 05/27/20 0117  NA 140   < >   < >  --   --   --  137   < > 141 141 142 141 141  K 4.1   < >   < >  --   --   --  3.7   < > 3.3* 3.7 3.9 3.6 3.1*  CL 102   < >   < >  --   --   --  103   < > 104 107 107 104 108  CO2 28   < >  --   --   --   --  25   < > 26 25 24 27 26   GLUCOSE 174*   < >   < >  --   --   --  124*   < > 84 162* 148* 100* 102*  BUN 25*   < >   < >  --   --   --  24*   < > 23 22 19 17 14   CREATININE 2.06*   < >   < >  --   --   --  1.50*   < > 1.08 0.99 1.33* 1.06 0.86  CALCIUM 8.0*   < >  --   --   --   --  7.9*   < > 8.1* 8.1* 8.1* 8.3* 7.8*  AST 42*   < >  --   --   --   --  34  --  46* 52* 36 31  --   ALT 32   < >  --   --   --   --  27  --  35 44 37 27  --   ALKPHOS 83   < >  --   --   --   --  71  --  67 74 88 80  --   BILITOT 0.4   < >  --   --   --   --  0.6  --  0.8 0.9 0.9 1.0  --   ALBUMIN 1.8*   < >  --   --   --   --  1.8*  --  1.6* 1.8* 1.9* 1.8*  --   MG  --   --   --   --   --   --   --   --  1.9 1.9  --   --   --   CRP  --   --   --   --   --   --  0.7  --  0.7 0.6  --   --   --   PROCALCITON  --   --   --   --   --   --  0.12  --  0.11 <0.10  --   --   --   LATICACIDVEN 3.4*  --   --   --   2.7* 2.2*  --   --   --   --   --   --   --   INR 1.5*  --   --   --   --   --  1.5*  --   --   --   --   --   --   TSH  --   --   --   --  1.839  --   --   --   --   --   --   --   --   AMMONIA  --   --   --  21  --   --   --   --   --   --   --   --   --   BNP  --   --   --   --   --   --   --   --  35.1 32.0  --   --   --    < > = values in this interval not displayed.     Micro Results Recent Results (from the past 240 hour(s))  Urine culture     Status: None   Collection Time: 05/21/20  3:33 PM   Specimen: In/Out Cath Urine  Result Value Ref Range Status   Specimen Description IN/OUT CATH URINE  Final   Special Requests NONE  Final   Culture   Final    NO GROWTH Performed at [facility], [address], [place], [place] [address]    Report Status 05/22/2020 FINAL  Final  Resp Panel by RT-PCR (Flu A&B, Covid) Nasopharyngeal Swab     Status: None   Collection Time: 05/21/20  3:35 PM   Specimen: Nasopharyngeal Swab; Nasopharyngeal(NP) swabs in vial transport medium  Result Value Ref Range Status   SARS Coronavirus 2 by RT PCR NEGATIVE NEGATIVE Final    Comment: (NOTE) SARS-CoV-2 target nucleic acids are NOT DETECTED.  The SARS-CoV-2 RNA is generally detectable in upper respiratory specimens during the acute phase of infection. The lowest concentration of SARS-CoV-2 viral copies this assay can detect is 138 copies/mL. A negative result does not preclude SARS-Cov-2 infection and should not be used as the sole basis for treatment or other patient management decisions. A negative result may occur with  improper specimen collection/handling, submission of specimen other than nasopharyngeal swab, presence of viral mutation(s) within the areas targeted by this assay, and inadequate number of viral copies(<138 copies/mL). A negative result must be combined with clinical observations, patient history, and epidemiological information. The expected result is Negative.  Fact  Sheet for Patients:  EntrepreneurPulse.com.au  Fact Sheet for Healthcare Providers:  IncredibleEmployment.be  This test is no t yet approved or cleared by the [place] FDA and  has been authorized for detection and/or diagnosis of SARS-CoV-2 by FDA under an Emergency Use Authorization (EUA). This EUA will remain  in effect (meaning this test can be used) for the duration of the COVID-19 declaration under Section 564(b)(1) of the Act, 21 U.S.C.section 360bbb-3(b)(1), unless the authorization is terminated  or revoked  sooner.       Influenza A by PCR NEGATIVE NEGATIVE Final   Influenza B by PCR NEGATIVE NEGATIVE Final    Comment: (NOTE) The Xpert Xpress SARS-CoV-2/FLU/RSV plus assay is intended as an aid in the diagnosis of influenza from Nasopharyngeal swab specimens and should not be used as a sole basis for treatment. Nasal washings and aspirates are unacceptable for Xpert Xpress SARS-CoV-2/FLU/RSV testing.  Fact Sheet for Patients: EntrepreneurPulse.com.au  Fact Sheet for Healthcare Providers: IncredibleEmployment.be  This test is not yet approved or cleared by the [place] FDA and has been authorized for detection and/or diagnosis of SARS-CoV-2 by FDA under an Emergency Use Authorization (EUA). This EUA will remain in effect (meaning this test can be used) for the duration of the COVID-19 declaration under Section 564(b)(1) of the Act, 21 U.S.C. section 360bbb-3(b)(1), unless the authorization is terminated or revoked.  Performed at [facility], [place] [address], [place], [place] [address]   Blood Culture (routine x 2)     Status: None   Collection Time: 05/21/20  4:40 PM   Specimen: BLOOD  Result Value Ref Range Status   Specimen Description BLOOD SITE NOT SPECIFIED  Final   Special Requests   Final    BOTTLES DRAWN AEROBIC ONLY Blood Culture results may not be optimal due to an  inadequate volume of blood received in culture bottles   Culture   Final    NO GROWTH 5 DAYS Performed at [facility], [place] [address] [place], [place] [address]    Report Status 05/26/2020 FINAL  Final  Blood Culture (routine x 2)     Status: None   Collection Time: 05/21/20  4:42 PM   Specimen: BLOOD  Result Value Ref Range Status   Specimen Description BLOOD SITE NOT SPECIFIED  Final   Special Requests   Final    BOTTLES DRAWN AEROBIC ONLY Blood Culture results may not be optimal due to an inadequate volume of blood received in culture bottles   Culture   Final    NO GROWTH 5 DAYS Performed at [facility], [place] [address], [place], [place] [address]    Report Status 05/26/2020 FINAL  Final    Radiology Reports CT Head Wo Contrast  Result Date: 05/21/2020 CLINICAL DATA:  Mental status change. EXAM: CT HEAD WITHOUT CONTRAST TECHNIQUE: Contiguous axial images were obtained from the base of the skull through the vertex without intravenous contrast. COMPARISON:  May 03, 2020 FINDINGS: Brain: No evidence of acute infarction, hemorrhage, hydrocephalus, extra-axial collection or mass lesion/mass effect. Mild brain parenchymal volume loss and deep white matter microangiopathy. Vascular: No hyperdense vessel or unexpected calcification. Skull: Normal. Negative for fracture or focal lesion. Sinuses/Orbits: No acute finding. Other: None. IMPRESSION: 1. No acute intracranial abnormality. 2. 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 Electronically Signed   By: Fidela Salisbury M.D.   On: 05/21/2020 17:53   CT Head Wo Contrast  Result Date: 05/03/2020 CLINICAL DATA:  Facial trauma after multiple falls. EXAM: CT HEAD WITHOUT CONTRAST CT CERVICAL SPINE WITHOUT CONTRAST TECHNIQUE: Multidetector CT imaging of the head and cervical spine was performed following the standard protocol without intravenous contrast. Multiplanar CT image reconstructions of the  cervical spine were also generated. COMPARISON:  April 21, 2020. FINDINGS: CT HEAD FINDINGS Brain: Mild chronic ischemic white matter disease is noted. No mass effect or midline shift is noted. Ventricular size is within normal limits. There is no evidence of mass  lesion, hemorrhage or acute infarction. Vascular: No hyperdense vessel or unexpected calcification. Skull: Normal. Negative for fracture or focal lesion. Sinuses/Orbits: No acute finding. Other: None. CT CERVICAL SPINE FINDINGS Alignment: Normal. Skull base and vertebrae: No acute fracture is noted. Postsurgical changes are seen involving the C3, C4 and C5 vertebral bodies. Soft tissues and spinal canal: No prevertebral fluid or swelling. No visible canal hematoma. Disc levels: Status post surgical anterior fusion of C3-4 and C4-5. There is fusion of the C5-6 and C6-7 disc spaces secondary to degenerative change. Upper chest: Negative. Other: None. IMPRESSION: 1. Mild chronic ischemic white matter disease. No acute intracranial abnormality seen. 2. Postsurgical and degenerative changes as described above. No acute abnormality seen in the cervical spine. Electronically Signed   By: Marijo Conception M.D.   On: 05/03/2020 14:41   CT Chest Wo Contrast  Result Date: 05/03/2020 CLINICAL DATA:  Short of breath, fever, sepsis, multiple falls EXAM: CT CHEST WITHOUT CONTRAST TECHNIQUE: Multidetector CT imaging of the chest was performed following the standard protocol without IV contrast. COMPARISON:  05/03/2020 FINDINGS: Cardiovascular: Unenhanced imaging of the heart and great vessels demonstrates trace pericardial fluid. The heart is not enlarged. Aneurysmal dilatation of the ascending thoracic aorta measuring up to 4.4 cm. Evaluation of the vascular lumen is limited without intravenous contrast. Mediastinum/Nodes: No enlarged mediastinal or axillary lymph nodes. Thyroid gland, trachea, and esophagus demonstrate no significant findings. Lungs/Pleura: There  is diffuse bronchial wall thickening. Complete opacification of the bronchus intermedius and right middle and right lower lobe bronchi are seen, along with partial opacification of the left lower lobe bronchus. There is patchy airspace disease within the right upper and left lower lobes. Complete consolidation of the right middle and right lower lobes with associated volume loss consistent with atelectasis. No effusion or pneumothorax. Upper Abdomen: Calcified gallstone is identified. The remainder of the upper abdomen is unremarkable. Musculoskeletal: Severe left shoulder osteoarthritis. No acute or destructive bony lesions. Reconstructed images demonstrate no additional findings. IMPRESSION: 1. Diffuse bronchial wall thickening, with patchy airspace disease of the right upper and left lower lobes, consistent with bronchopneumonia. 2. Opacification of the right middle, right lower, and left lower lobe bronchi, with complete collapse of the right middle and right lower lobes. This may be related to mucous plugging or underlying infection. 3. Trace pericardial fluid. 4. Cholelithiasis without cholecystitis. 5. 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 Recommend annual imaging followup by CTA or MRA. This recommendation follows 2010 ACCF/AHA/AATS/ACR/ASA/SCA/SCAI/SIR/STS/SVM Guidelines for the Diagnosis and Management of Patients with Thoracic Aortic Disease. Circulation. 2010; 121: M034-J179. Aortic aneurysm NOS (ICD10-I71.9) Electronically Signed   By: Randa Ngo M.D.   On: 05/03/2020 16:31   CT Cervical Spine Wo Contrast  Result Date: 05/03/2020 CLINICAL DATA:  Facial trauma after multiple falls. EXAM: CT HEAD WITHOUT CONTRAST CT CERVICAL SPINE WITHOUT CONTRAST TECHNIQUE: Multidetector CT imaging of the head and cervical spine was performed following the standard protocol without intravenous contrast. Multiplanar CT image reconstructions of the cervical spine were also  generated. COMPARISON:  April 21, 2020. FINDINGS: CT HEAD FINDINGS Brain: Mild chronic ischemic white matter disease is noted. No mass effect or midline shift is noted. Ventricular size is within normal limits. There is no evidence of mass lesion, hemorrhage or acute infarction. Vascular: No hyperdense vessel or unexpected calcification. Skull: Normal. Negative for fracture or focal lesion. Sinuses/Orbits: No acute finding. Other: None. CT CERVICAL SPINE FINDINGS Alignment: Normal. Skull base and vertebrae: No acute fracture  is noted. Postsurgical changes are seen involving the C3, C4 and C5 vertebral bodies. Soft tissues and spinal canal: No prevertebral fluid or swelling. No visible canal hematoma. Disc levels: Status post surgical anterior fusion of C3-4 and C4-5. There is fusion of the C5-6 and C6-7 disc spaces secondary to degenerative change. Upper chest: Negative. Other: None. IMPRESSION: 1. Mild chronic ischemic white matter disease. No acute intracranial abnormality seen. 2. Postsurgical and degenerative changes as described above. No acute abnormality seen in the cervical spine. Electronically Signed   By: Marijo Conception M.D.   On: 05/03/2020 14:41   DG Chest Port 1 View  Result Date: 05/21/2020 CLINICAL DATA:  Found unresponsive. EXAM: PORTABLE CHEST 1 VIEW COMPARISON:  May 08, 2020 FINDINGS: There is stable elevation of the right hemidiaphragm. Mild bilateral infrahilar atelectasis is seen. There is no evidence of a pleural effusion or pneumothorax. The heart size and mediastinal contours are within normal limits. The visualized skeletal structures are unremarkable. IMPRESSION: Mild bilateral infrahilar atelectasis. Electronically Signed   By: Virgina Norfolk M.D.   On: 05/21/2020 16:34   DG Chest Port 1 View  Result Date: 05/08/2020 CLINICAL DATA:  Mucous plugging EXAM: PORTABLE CHEST 1 VIEW COMPARISON:  Two days ago FINDINGS: Low volume chest with opacity at both lung bases. Air  bronchograms seen on the left. No edema, effusion, or pneumothorax. Normal heart size IMPRESSION: Continued volume loss with extensive atelectasis at the bases. Electronically Signed   By: Monte Fantasia M.D.   On: 05/08/2020 06:46   DG Chest Port 1 View  Result Date: 05/06/2020 CLINICAL DATA:  Shortness of breath EXAM: PORTABLE CHEST 1 VIEW COMPARISON:  May 05, 2020 FINDINGS: Persistent right pleural effusion with areas of atelectatic change in the lung bases noted. Heart size and pulmonary vascularity are normal. No adenopathy. No bone lesions. IMPRESSION: Stable right pleural effusion with bibasilar atelectasis. A degree of superimposed infiltrate in the right base cannot be excluded. Stable cardiac silhouette. Electronically Signed   By: Lowella Grip III M.D.   On: 05/06/2020 08:29   DG Chest Port 1 View  Result Date: 05/05/2020 CLINICAL DATA:  Respiratory distress. EXAM: PORTABLE CHEST 1 VIEW COMPARISON:  05/03/2020 FINDINGS: The cardiac silhouette, mediastinal and hilar contours are within normal limits and stable. Moderate-sized right pleural effusion with overlying atelectasis or infiltrate. Patchy left basilar infiltrate. IMPRESSION: Moderate-sized right pleural effusion with overlying atelectasis or infiltrate. Patchy left basilar infiltrate. Electronically Signed   By: Marijo Sanes M.D.   On: 05/05/2020 07:15   DG CHEST PORT 1 VIEW  Result Date: 05/04/2020 CLINICAL DATA:  Respiratory distress EXAM: PORTABLE CHEST 1 VIEW COMPARISON:  05/03/2020 FINDINGS: Unchanged right basilar consolidation. Cardiomediastinal contours and pleural spaces are normal. Left lung is clear. IMPRESSION: Unchanged right basilar consolidation. Electronically Signed   By: Ulyses Jarred M.D.   On: 05/04/2020 00:07   DG Chest Port 1 View  Result Date: 05/03/2020 CLINICAL DATA:  Questionable sepsis. EXAM: PORTABLE CHEST 1 VIEW COMPARISON:  01/09/2020. FINDINGS: The heart size and mediastinal contours  are within normal limits. Low lung volumes. Left basilar opacities. No visible pleural effusions or pneumothorax. Elevated right hemidiaphragm. No acute osseous abnormality. Bilateral shoulder degenerative change. Partially imaged cervical ACDF. IMPRESSION: Low lung volumes with left basilar opacities, which may represent atelectasis, aspiration and/or pneumonia. Dedicated PA and lateral radiographs could better characterize if clinically indicated. Electronically Signed   By: Margaretha Sheffield MD   On: 05/03/2020 13:12   DG Swallowing  Func-Speech Pathology  Result Date: 05/04/2020 Objective Swallowing Evaluation: Type of Study: Bedside Swallow Evaluation  Patient Details Name: JONATHAN CORPUS MRN: [id number] Date of Birth: 1945-10-18 Today's Date: 05/04/2020 Time: SLP Start Time (ACUTE ONLY): 8295 -SLP Stop Time (ACUTE ONLY): 1103 SLP Time Calculation (min) (ACUTE ONLY): 19 min Past Medical History: Past Medical History: Diagnosis Date . Asthma  . Chiari I malformation ([place])   with assoc syringomyelia.  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 Sherwood [place], 1992-->surg at [facility].  Summer 2021->Cervicalgia,arm pain, hand atrophy RUE wkness, hyperreflex (Dr. 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 Hay fever  . Hypertension  . Osteoarthritis of both hands  Past Surgical History: Past Surgical History: Procedure Laterality Date . ANTERIOR CERVICAL DECOMP/DISCECTOMY FUSION N/A 04/05/2020  Procedure: ANTERIOR CERVICAL DECOMPRESSION/DISCECTOMY FUSION, INTERBODY PROSTHESIS, PLATE/SCREWS CERVICAL THREE-CERVICAL FOUR, CERVICAL FOUR- CERVICAL FIVE;  Surgeon: Newman Pies, MD;  Location: [place];  Service: Neurosurgery;  Laterality: N/A; . ANTERIOR CERVICAL DECOMP/DISCECTOMY FUSION N/A 04/22/2020  Procedure: Reexploration of anterior cervical wound for epidural hematoma;  Surgeon: Kary Kos, MD;  Location: [place];  Service: Neurosurgery;   Laterality: N/A; . BACK SURGERY   . INCISION AND DRAINAGE Left 2012  L hand infection . ROTATOR CUFF REPAIR Right   x 3 . SPINE SURGERY   HPI: 74 year old white male who presented with right lower lobe pneumonia and known history of  Quadriparesis.  He originally had a C3-4 [place] 4-5 anterior cervical disc asked to me with decompression, C3 3-4 and C4-5 interbody arthrodesis with local autograft bone, anterior cervical plating of C3-C5 on 04/05/2020.  He then went to rehab and was discharged.  On 04/21/2020 the patient presented to the emergency room with progressive bilateral quadriplegia.  Patient went to the operating room and had decompression of a vertebral hematoma.  And been discharged on 11 10/2019 and readmitted on 05/03/2020 for increasing shortness of breath and productive cough.  Chest x-ray demonstrated a new right lower lobe infiltrate with compressive atelectasis, concerning for aspiration pneumonia.  Pt had MBSS during most recent admission on 04/25/2020 with recommendations for puree and thin liquids, advanced to Dys3 prior to discharge  Subjective: alert, cooperative, pleasant, participative. Assessment / Plan / Recommendation CHL IP CLINICAL IMPRESSIONS 05/04/2020 Clinical Impression Pt presents with moderate oropharyngeal dysphagia c/b decreased base of tongue retraction, delayed swallow inititation, incomplete laryngeal closure, reduced pharyngeal perstalsis, decreased UES opening, and diminished sensation. These deficits resulted in silent aspiration of thin liquid and nectar thick liquid by straw prior to the swallow. With nectar thick liquid by cup, only transient penetration was seen. There was significant pharyngeal residue with puree and solid consistencies which was reduced but not fully cleared with liquid wash. There is noticeable prevertebral edema in pharynx which appears to be impacting swallow function, but pt is able to acheive adequate epiglottic deflection. This study represents a  marked decline in swallow function from MBSS on 04/25/20; however, from review of images in both studies, amount of edema appears comparable to naked eye without any accurate measurement, or comparing level of magnification during each study. Because of recent ACDF (11/13) and revision (10/29), and presence of cervical collar, SLP intervention options are limited outside of modification of diet. Pt cannot trial compensatory strategies requiring change to head/neck positioning, and many swallow exercises cannot be executed in this position. Pt may benefit from some tongue strengthening exercises to improve swallow drive and decrease vallecula residue. Recommend initiating  full liquid diet with nectar thick liquids, by cup sip only. NO STRAWS. Please crush medications if permissible. Called neice, Peter Congo, and shared results of today's study which she will pass on to pt's daughter who will be coming to town tomorrow. SLP Visit Diagnosis -- Attention and concentration deficit following -- Frontal lobe and executive function deficit following -- Impact on safety and function --   CHL IP TREATMENT RECOMMENDATION 05/04/2020 Treatment Recommendations Therapy as outlined in treatment plan below   Prognosis 05/04/2020 Prognosis for Safe Diet Advancement Fair Barriers to Reach Goals Cognitive deficits Barriers/Prognosis Comment -- CHL IP DIET RECOMMENDATION 05/04/2020 SLP Diet Recommendations Nectar thick liquid Liquid Administration via No straw Medication Administration -- Compensations Slow rate;Small sips/bites;Follow solids with liquid Postural Changes Seated upright at 90 degrees;Remain semi-upright after after feeds/meals (Comment)   CHL IP OTHER RECOMMENDATIONS 05/04/2020 Recommended Consults -- Oral Care Recommendations Oral care BID Other Recommendations --   CHL IP FOLLOW UP RECOMMENDATIONS 05/04/2020 Follow up Recommendations (No Data)   CHL IP FREQUENCY AND DURATION 05/04/2020 Speech Therapy Frequency (ACUTE ONLY)  min 2x/week Treatment Duration 2 weeks      CHL IP ORAL PHASE 05/04/2020 Oral Phase -- Oral - Pudding Teaspoon -- Oral - Pudding Cup -- Oral - Honey Teaspoon -- Oral - Honey Cup -- Oral - Nectar Teaspoon -- Oral - Nectar Cup WFL Oral - Nectar Straw Premature spillage;Decreased bolus cohesion Oral - Thin Teaspoon -- Oral - Thin Cup Premature spillage Oral - Thin Straw Premature spillage Oral - Puree WFL Oral - Mech Soft WFL Oral - Regular -- Oral - Multi-Consistency -- Oral - Pill WFL Oral Phase - Comment --  CHL IP PHARYNGEAL PHASE 05/04/2020 Pharyngeal Phase Impaired Pharyngeal- Pudding Teaspoon -- Pharyngeal -- Pharyngeal- Pudding Cup -- Pharyngeal -- Pharyngeal- Honey Teaspoon -- Pharyngeal -- Pharyngeal- Honey Cup -- Pharyngeal -- Pharyngeal- Nectar Teaspoon -- Pharyngeal -- Pharyngeal- Nectar Cup -- Pharyngeal -- Pharyngeal- Nectar Straw -- Pharyngeal -- Pharyngeal- Thin Teaspoon -- Pharyngeal -- Pharyngeal- Thin Cup -- Pharyngeal -- Pharyngeal- Thin Straw -- Pharyngeal -- Pharyngeal- Puree -- Pharyngeal -- Pharyngeal- Mechanical Soft -- Pharyngeal -- Pharyngeal- Regular -- Pharyngeal -- Pharyngeal- Multi-consistency -- Pharyngeal -- Pharyngeal- Pill -- Pharyngeal -- Pharyngeal Comment --  CHL IP CERVICAL ESOPHAGEAL PHASE 05/04/2020 Cervical Esophageal Phase Impaired Pudding Teaspoon -- Pudding Cup -- Honey Teaspoon -- Honey Cup -- Nectar Teaspoon -- Nectar Cup Reduced cricopharyngeal relaxation Nectar Straw Reduced cricopharyngeal relaxation Thin Teaspoon -- Thin Cup Reduced cricopharyngeal relaxation Thin Straw Reduced cricopharyngeal relaxation Puree Reduced cricopharyngeal relaxation Mechanical Soft Reduced cricopharyngeal relaxation Regular -- Multi-consistency -- Pill Reduced cricopharyngeal relaxation Cervical Esophageal Comment trace esophageal retention of contrast Celedonio Savage, MA, [place] Office: [phone] 05/04/2020, 11:59 AM

## 2020-05-28 DIAGNOSIS — I10 Essential (primary) hypertension: Secondary | ICD-10-CM | POA: Diagnosis not present

## 2020-05-28 DIAGNOSIS — N390 Urinary tract infection, site not specified: Secondary | ICD-10-CM | POA: Diagnosis not present

## 2020-05-28 DIAGNOSIS — A419 Sepsis, unspecified organism: Secondary | ICD-10-CM | POA: Diagnosis not present

## 2020-05-28 DIAGNOSIS — Z978 Presence of other specified devices: Secondary | ICD-10-CM | POA: Diagnosis not present

## 2020-05-28 LAB — CBC
HCT: 25.4 % — ABNORMAL LOW (ref 39.0–52.0)
Hemoglobin: 8.3 g/dL — ABNORMAL LOW (ref 13.0–17.0)
MCH: 30.4 pg (ref 26.0–34.0)
MCHC: 32.7 g/dL (ref 30.0–36.0)
MCV: 93 fL (ref 80.0–100.0)
Platelets: 131 10*3/uL — ABNORMAL LOW (ref 150–400)
RBC: 2.73 MIL/uL — ABNORMAL LOW (ref 4.22–5.81)
RDW: 17.2 % — ABNORMAL HIGH (ref 11.5–15.5)
WBC: 10.6 10*3/uL — ABNORMAL HIGH (ref 4.0–10.5)
nRBC: 0 % (ref 0.0–0.2)

## 2020-05-28 LAB — BASIC METABOLIC PANEL
Anion gap: 8 (ref 5–15)
BUN: 11 mg/dL (ref 8–23)
CO2: 26 mmol/L (ref 22–32)
Calcium: 7.9 mg/dL — ABNORMAL LOW (ref 8.9–10.3)
Chloride: 111 mmol/L (ref 98–111)
Creatinine, Ser: 0.69 mg/dL (ref 0.61–1.24)
GFR, Estimated: >60 mL/min (ref >60)
Glucose, Bld: 110 mg/dL — ABNORMAL HIGH (ref 70–99)
Potassium: 3.7 mmol/L (ref 3.5–5.1)
Sodium: 145 mmol/L (ref 135–145)

## 2020-05-28 LAB — MAGNESIUM: Magnesium: 1.6 mg/dL — ABNORMAL LOW (ref 1.7–2.4)

## 2020-05-28 NOTE — Progress Notes (Signed)
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 Patient had anterior approach for the repair.  He then went home and had a fall from his bed, he was then hospitalized and from here he was sent to a nursing home, he has been quadriplegic since then, he has also developed sacral decubitus ulcers.   Assessment & Plan:   Principal Problem:   Sepsis secondary to UTI [facility]) Active Problems:   Benign essential HTN   Quadriplegia and quadriparesis ([place])   Essential hypertension   Myelopathy (H[facility])   Chronic indwelling Foley catheter   Pressure injury of skin   Palliative care by specialist   Goals of care, counseling/discussion   Acute metabolic encephalopathy due to UTI, sepsis and hypotension Head CT was unremarkable.   Suspect secondary to acute infection. Patient was treated with IV antibiotics. Blood and urine cultures without any growth noted.   Mentation seems to be back to baseline.  No focal deficits.  Hematuria in the setting of indwelling Foley catheter/urethral injury/UTI Seen by urology due to persistent hematuria.  CBI was initiated.  Patient experiencing bladder spasms.  He is getting B&O suppositories as needed. Patient continues to be on CBI currently, plan to wean to off per Urology recs.  Renal function remains stable.  He remains on ceftriaxone for now.  Cultures have been negative so far.  Plan will be to transition to oral antibacterials when pt is weanedoff of CBI.  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 Arnoldo Morale.  Subsequent fall at home with progression to quadriplegia.   Previous  rounding MD had some discussions with the patient during which the patient hinted that he may want to pursue comfort care. On discussions with palliative care, plan is to continue full scope of care.    Chronic sacral decubitus ulcers POA Wound care on board. Multiple ulcers unstageable. General surgery was consulted., no debridement needed, continue wound care.  Essential hypertension/hypokalemia Blood pressure medications on hold due to low BP.  Blood pressure is relatively stable. Recheck lytes in AM  Anemia of chronic disease with some evidence of low B12 and borderline iron deficiency/acute blood loss anemia due to hematuria Continue B12 supplementation and iron.   Pt required 1 unit transfusion on 12/3.  Transfuse also on 11/30.   Hematuria is likely contributing to his drop in hemoglobin.   Recheck cbc in AM, plan to transfuse for hgb <7  Chronic Eliquis use This was started for DVT prophylaxis at the rehab.  Has been discontinued.  Placed on heparin which was stopped due to hematuria.   Goals of care Palliative care met with patient and family yesterday.  Plan is to continue full scope of care.  DVT prophylaxis: SCD's Code Status: Full Family Communication: Pt in room, family not at bedside  Status is: Inpatient  Remains inpatient appropriate because:Unsafe d/c plan and Inpatient level of care appropriate due to severity of illness   Dispo: The patient is from: SNF              Anticipated d/c is to: SNF              Anticipated d/c date is: 2 days  Patient currently is not medically stable to d/c.       Consultants:   Urology  Procedures:     Antimicrobials: Anti-infectives (From admission, onward)   Start     Dose/Rate Route Frequency Ordered Stop   05/24/20 1415  cefTRIAXone (ROCEPHIN) 1 g in sodium chloride 0.9 % 100 mL IVPB        1 g 200 mL/hr over 30 Minutes Intravenous Every 24 hours 05/24/20 1330     05/23/20 1400  ceFEPIme  (MAXIPIME) 2 g in sodium chloride 0.9 % 100 mL IVPB  Status:  Discontinued        2 g 200 mL/hr over 30 Minutes Intravenous Every 8 hours 05/23/20 1011 05/24/20 1330   05/22/20 0500  ceFEPIme (MAXIPIME) 2 g in sodium chloride 0.9 % 100 mL IVPB  Status:  Discontinued        2 g 200 mL/hr over 30 Minutes Intravenous Every 12 hours 05/21/20 1614 05/23/20 1011   05/21/20 2015  aztreonam (AZACTAM) 2 g in sodium chloride 0.9 % 100 mL IVPB        2 g 200 mL/hr over 30 Minutes Intravenous  Once 05/21/20 2010 05/21/20 2230   05/21/20 1730  vancomycin (VANCOREADY) IVPB 1750 mg/350 mL        1,750 mg 175 mL/hr over 120 Minutes Intravenous  Once 05/21/20 1722 05/21/20 2148   05/21/20 1545  ceFEPIme (MAXIPIME) 2 g in sodium chloride 0.9 % 100 mL IVPB        2 g 200 mL/hr over 30 Minutes Intravenous  Once 05/21/20 1535 05/21/20 1704       Subjective: Without complaints this AM  Objective: Vitals:   05/27/20 0500 05/27/20 1514 05/28/20 0627 05/28/20 1422  BP: (!) 117/58 109/65 132/70 128/72  Pulse: 78 90 71 77  Resp: 16 17 20 18   Temp: 99.1 F (37.3 C) 99 F (37.2 C) 97.8 F (36.6 C) 98.2 F (36.8 C)  TempSrc: Oral  Oral   SpO2: 96% 94% 99% 100%  Weight:      Height:        Intake/Output Summary (Last 24 hours) at 05/28/2020 1501 Last data filed at 05/28/2020 1057 Gross per 24 hour  Intake 18000 ml  Output 12900 ml  Net 5100 ml   Filed Weights   05/21/20 1604 05/22/20 0045 05/22/20 1115  Weight: 84.7 kg 120.7 kg 120.7 kg    Examination:  General exam: Appears calm and comfortable  Respiratory system: Clear to auscultation. Respiratory effort normal. Cardiovascular system: S1 & S2 heard, Regular Gastrointestinal system: Abdomen is nondistended, soft and nontender. No organomegaly or masses felt. Normal bowel sounds heard. Central nervous system: Alert and oriented. No focal neurological deficits. Extremities: Symmetric 5 x 5 power. Skin: No rashes, lesions  Psychiatry:  Judgement and insight appear normal. Mood & affect appropriate.   Data Reviewed: I have personally reviewed following labs and imaging studies  CBC: Recent Labs  Lab 05/21/20 1533 05/21/20 1541 05/23/20 0432 05/23/20 2306 05/24/20 0127 05/24/20 0127 05/25/20 0353 05/25/20 0957 05/26/20 0621 05/27/20 0117 05/28/20 0116  WBC 12.4*   < > 8.1   < > 10.6*  --  16.1*  --  12.4* 11.4* 10.6*  NEUTROABS 9.9*  --  5.4  --  7.3  --  12.5*  --  8.7*  --   --   HGB 9.4*   < > 7.4*   < > 8.9*   < > 8.4* 7.7* 7.2* 7.5* 8.3*  HCT 30.9*   < > 23.9*   < > 27.4*   < > 25.5* 22.7* 23.9* 23.4* 25.4*  MCV 94.2   < > 94.1   < > 92.6  --  92.4  --  98.8 93.2 93.0  PLT 187   < > 150   < > 179  --  175  --  145* 124* 131*   < > = values in this interval not displayed.   Basic Metabolic Panel: Recent Labs  Lab 05/23/20 0432 05/23/20 0432 05/24/20 0127 05/25/20 0353 05/26/20 0621 05/27/20 0117 05/28/20 0116  NA 141   < > 141 142 141 141 145  K 3.3*   < > 3.7 3.9 3.6 3.1* 3.7  CL 104   < > 107 107 104 108 111  CO2 26   < > 25 24 27 26 26   GLUCOSE 84   < > 162* 148* 100* 102* 110*  BUN 23   < > 22 19 17 14 11   CREATININE 1.08   < > 0.99 1.33* 1.06 0.86 0.69  CALCIUM 8.1*   < > 8.1* 8.1* 8.3* 7.8* 7.9*  MG 1.9  --  1.9  --   --   --  1.6*   < > = values in this interval not displayed.   GFR: Estimated Creatinine Clearance: 108.6 mL/min (by C-G formula based on SCr of 0.69 mg/dL). Liver Function Tests: Recent Labs  Lab 05/22/20 0507 05/23/20 0432 05/24/20 0127 05/25/20 0353 05/26/20 0621  AST 34 46* 52* 36 31  ALT 27 35 44 37 27  ALKPHOS 71 67 74 88 80  BILITOT 0.6 0.8 0.9 0.9 1.0  PROT 5.1* 4.9* 5.4* 5.4* 5.1*  ALBUMIN 1.8* 1.6* 1.8* 1.9* 1.8*   No results for input(s): LIPASE, AMYLASE in the last 168 hours. Recent Labs  Lab 05/21/20 1546  AMMONIA 21   Coagulation Profile: Recent Labs  Lab 05/21/20 1533 05/22/20 0507  INR 1.5* 1.5*   Cardiac Enzymes: No results for  input(s): CKTOTAL, CKMB, CKMBINDEX, TROPONINI in the last 168 hours. BNP (last 3 results) No results for input(s): PROBNP in the last 8760 hours. HbA1C: No results for input(s): HGBA1C in the last 72 hours. CBG: Recent Labs  Lab 05/22/20 1118  GLUCAP 100*   Lipid Profile: No results for input(s): CHOL, HDL, LDLCALC, TRIG, CHOLHDL, LDLDIRECT in the last 72 hours. Thyroid Function Tests: No results for input(s): TSH, T4TOTAL, FREET4, T3FREE, THYROIDAB in the last 72 hours. Anemia Panel: No results for input(s): VITAMINB12, FOLATE, FERRITIN, TIBC, IRON, RETICCTPCT in the last 72 hours. Sepsis Labs: Recent Labs  Lab 05/21/20 1533 05/21/20 1640 05/21/20 2030 05/22/20 0507 05/23/20 0432 05/24/20 0127  PROCALCITON  --   --   --  0.12 0.11 <0.10  LATICACIDVEN 3.4* 2.7* 2.2*  --   --   --     Recent Results (from the past 240 hour(s))  Urine culture     Status: None   Collection Time: 05/21/20  3:33 PM   Specimen: In/Out Cath Urine  Result Value Ref Range Status   Specimen Description IN/OUT CATH URINE  Final   Special Requests NONE  Final   Culture   Final    NO GROWTH Performed at [facility], [place] [address] [place], [place] [address]    Report Status 05/22/2020 FINAL  Final  Resp Panel by RT-PCR (Flu A&B, Covid) Nasopharyngeal Swab     Status: None   Collection Time: 05/21/20  3:35 PM   Specimen: Nasopharyngeal Swab; Nasopharyngeal(NP) swabs in vial transport medium  Result Value Ref Range Status   SARS Coronavirus 2 by RT PCR NEGATIVE NEGATIVE Final    Comment: (NOTE) SARS-CoV-2 target nucleic acids are NOT DETECTED.  The SARS-CoV-2 RNA is generally detectable in upper respiratory specimens during the acute phase of infection. The lowest concentration of SARS-CoV-2 viral copies this assay can detect is 138 copies/mL. A negative result does not preclude SARS-Cov-2 infection and should not be used as the sole basis for treatment or other patient management  decisions. A negative result may occur with  improper specimen collection/handling, submission of specimen other than nasopharyngeal swab, presence of viral mutation(s) within the areas targeted by this assay, and inadequate number of viral copies(<138 copies/mL). A negative result must be combined with clinical observations, patient history, and epidemiological information. The expected result is Negative.  Fact Sheet for Patients:  EntrepreneurPulse.com.au  Fact Sheet for Healthcare Providers:  IncredibleEmployment.be  This test is no t yet approved or cleared by the [place] FDA and  has been authorized for detection and/or diagnosis of SARS-CoV-2 by FDA under an Emergency Use Authorization (EUA). This EUA will remain  in effect (meaning this test can be used) for the duration of the COVID-19 declaration under Section 564(b)(1) of the Act, 21 U.S.C.section 360bbb-3(b)(1), unless the authorization is terminated  or revoked sooner.       Influenza A by PCR NEGATIVE NEGATIVE Final   Influenza B by PCR NEGATIVE NEGATIVE Final    Comment: (NOTE) The Xpert Xpress SARS-CoV-2/FLU/RSV plus assay is intended as an aid in the diagnosis of influenza from Nasopharyngeal swab specimens and should not be used as a sole basis for treatment. Nasal washings and aspirates are unacceptable for Xpert Xpress SARS-CoV-2/FLU/RSV testing.  Fact Sheet for Patients: EntrepreneurPulse.com.au  Fact Sheet for Healthcare Providers: IncredibleEmployment.be  This test is not yet approved or cleared by the [place] FDA and has been authorized for detection and/or diagnosis of SARS-CoV-2 by FDA under an Emergency Use Authorization (EUA). This EUA will remain in effect (meaning this test can be used) for the duration of the COVID-19 declaration under Section 564(b)(1) of the Act, 21 U.S.C. section 360bbb-3(b)(1), unless the  authorization is terminated or revoked.  Performed at [facility], [place] [address] [place], [place] [address]   Blood Culture (routine x 2)     Status: None   Collection Time: 05/21/20  4:40 PM   Specimen: BLOOD  Result Value Ref Range Status   Specimen Description BLOOD SITE NOT SPECIFIED  Final   Special Requests   Final    BOTTLES DRAWN AEROBIC ONLY Blood Culture results may not be optimal due to an inadequate volume of blood received in culture bottles   Culture   Final    NO GROWTH 5 DAYS Performed at [facility], [place] [address] [place], [place] [address]    Report Status 05/26/2020 FINAL  Final  Blood Culture (routine x 2)     Status: None   Collection Time: 05/21/20  4:42 PM   Specimen: BLOOD  Result Value Ref Range Status   Specimen Description BLOOD SITE NOT SPECIFIED  Final   Special Requests   Final    BOTTLES DRAWN AEROBIC ONLY Blood Culture results may not be optimal due to an inadequate volume of blood received in culture bottles   Culture   Final    NO GROWTH 5 DAYS Performed at [facility]  [facility], [place] [address] [place], [place] [address]    Report Status 05/26/2020 FINAL  Final     Radiology Studies: No results found.  Scheduled Meds: . Chlorhexidine Gluconate Cloth  6 each Topical Daily  . collagenase   Topical Daily  . cyanocobalamin  1,000 mcg Intramuscular Daily  . docusate sodium  100 mg Oral BID  . ferrous sulfate  325 mg Oral BID WC  .  morphine injection  2 mg Intravenous Once  . oxybutynin  5 mg Oral QHS  . pantoprazole  40 mg Oral QHS  . tamsulosin  0.4 mg Oral Daily   Continuous Infusions: . cefTRIAXone (ROCEPHIN)  IV 1 g (05/28/20 1101)  . sodium chloride irrigation       LOS: 7 days   Marylu Lund, MD [facility] Pager On Amion  If 7PM-7AM, please contact night-coverage 05/28/2020, 3:01 PM

## 2020-05-29 DIAGNOSIS — N390 Urinary tract infection, site not specified: Secondary | ICD-10-CM | POA: Diagnosis not present

## 2020-05-29 DIAGNOSIS — A419 Sepsis, unspecified organism: Secondary | ICD-10-CM | POA: Diagnosis not present

## 2020-05-29 DIAGNOSIS — I10 Essential (primary) hypertension: Secondary | ICD-10-CM | POA: Diagnosis not present

## 2020-05-29 LAB — CBC
HCT: 24.8 % — ABNORMAL LOW (ref 39.0–52.0)
Hemoglobin: 8 g/dL — ABNORMAL LOW (ref 13.0–17.0)
MCH: 30.3 pg (ref 26.0–34.0)
MCHC: 32.3 g/dL (ref 30.0–36.0)
MCV: 93.9 fL (ref 80.0–100.0)
Platelets: 142 10*3/uL — ABNORMAL LOW (ref 150–400)
RBC: 2.64 MIL/uL — ABNORMAL LOW (ref 4.22–5.81)
RDW: 16.6 % — ABNORMAL HIGH (ref 11.5–15.5)
WBC: 10.5 10*3/uL (ref 4.0–10.5)
nRBC: 0 % (ref 0.0–0.2)

## 2020-05-29 LAB — BASIC METABOLIC PANEL
Anion gap: 8 (ref 5–15)
BUN: 8 mg/dL (ref 8–23)
CO2: 28 mmol/L (ref 22–32)
Calcium: 8 mg/dL — ABNORMAL LOW (ref 8.9–10.3)
Chloride: 108 mmol/L (ref 98–111)
Creatinine, Ser: 0.66 mg/dL (ref 0.61–1.24)
GFR, Estimated: >60 mL/min (ref >60)
Glucose, Bld: 92 mg/dL (ref 70–99)
Potassium: 3.8 mmol/L (ref 3.5–5.1)
Sodium: 144 mmol/L (ref 135–145)

## 2020-05-29 NOTE — Progress Notes (Signed)
PROGRESS NOTE    Shannon [name]  OMV:[id number] DOB: Sep 13, 1945 DOA: 05/21/2020 PCP: Tammi Sou, MD    Brief Narrative:  74 y.o.malewith medical history significant ofquadriplegia which follows surgery for Chiari I malformation with syringomyelia surgery in October 2021. Patient had anterior approach for the repair.  He then went home and had a fall from his bed, he was then hospitalized and from here he was sent to a nursing home, he has been quadriplegic since then, he has also developed sacral decubitus ulcers.   Assessment & Plan:   Principal Problem:   Sepsis secondary to UTI [facility]) Active Problems:   Benign essential HTN   Quadriplegia and quadriparesis ([place])   Essential hypertension   Myelopathy (HCC)   Chronic indwelling Foley catheter   Pressure injury of skin   Palliative care by specialist   Goals of care, counseling/discussion   Acute metabolic encephalopathy due to UTI, sepsis and hypotension Head CT was unremarkable.   Suspect secondary to acute infection. Patient was treated with IV antibiotics. Blood and urine cultures without any growth noted.   Mentation seems to be stable and near baseline  Hematuria in the setting of indwelling Foley catheter/urethral injury/UTI Seen by urology due to persistent hematuria.  CBI was initiated.  Patient experiencing bladder spasms.  He is getting B&O suppositories as needed. Patient continues to be on CBI currently, plan to wean to off per Urology recs.  Renal function remains stable.  He remains on ceftriaxone for now.  Cultures have been negative so far.   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 Arnoldo Morale.  Subsequent fall at home with progression to quadriplegia.   Previous rounding MD had some  discussions with the patient during which the patient hinted that he may want to pursue comfort care. On discussions with palliative care, plan to continue full scope of care.    Chronic sacral decubitus ulcers POA Wound care on board. Multiple ulcers unstageable. General surgery was consulted., no debridement needed, continue wound care for now.  Essential hypertension/hypokalemia Blood pressure medications were on hold due to low BP.  Blood pressure remains relatively stable. Cont current plan Recheck lytes in AM  Anemia of chronic disease with some evidence of low B12 and borderline iron deficiency/acute blood loss anemia due to hematuria Continue B12 supplementation and iron.   Pt required 1 unit transfusion on 12/3.  Transfuse also on 11/30.   Hematuria is likely contributing to his drop in hemoglobin.   Recheck cbc in AM, plan to transfuse for hgb <7 Hgb this AM 8.0  Chronic Eliquis use This was started for DVT prophylaxis at the rehab.  Has been discontinued.  Pt was later placed on heparin which was stopped due to hematuria.   Goals of care Palliative care met with patient and family recently with plan to continue full scope of care.  DVT prophylaxis: SCD's Code Status: Full Family Communication: Pt in room, family not at bedside  Status is: Inpatient  Remains inpatient appropriate because:Unsafe d/c plan and Inpatient level of care appropriate due to severity of illness   Dispo: The patient is from: SNF              Anticipated d/c is to: SNF              Anticipated  d/c date is: 2 days              Patient currently is not medically stable to d/c. Awaiting completion of bladder irrigation   Consultants:   Urology  Procedures:     Antimicrobials: Anti-infectives (From admission, onward)   Start     Dose/Rate Route Frequency Ordered Stop   05/24/20 1415  cefTRIAXone (ROCEPHIN) 1 g in sodium chloride 0.9 % 100 mL IVPB        1 g 200 mL/hr over 30 Minutes  Intravenous Every 24 hours 05/24/20 1330     05/23/20 1400  ceFEPIme (MAXIPIME) 2 g in sodium chloride 0.9 % 100 mL IVPB  Status:  Discontinued        2 g 200 mL/hr over 30 Minutes Intravenous Every 8 hours 05/23/20 1011 05/24/20 1330   05/22/20 0500  ceFEPIme (MAXIPIME) 2 g in sodium chloride 0.9 % 100 mL IVPB  Status:  Discontinued        2 g 200 mL/hr over 30 Minutes Intravenous Every 12 hours 05/21/20 1614 05/23/20 1011   05/21/20 2015  aztreonam (AZACTAM) 2 g in sodium chloride 0.9 % 100 mL IVPB        2 g 200 mL/hr over 30 Minutes Intravenous  Once 05/21/20 2010 05/21/20 2230   05/21/20 1730  vancomycin (VANCOREADY) IVPB 1750 mg/350 mL        1,750 mg 175 mL/hr over 120 Minutes Intravenous  Once 05/21/20 1722 05/21/20 2148   05/21/20 1545  ceFEPIme (MAXIPIME) 2 g in sodium chloride 0.9 % 100 mL IVPB        2 g 200 mL/hr over 30 Minutes Intravenous  Once 05/21/20 1535 05/21/20 1704      Subjective: No complaints this AM. Denies abd pain or chest pain  Objective: Vitals:   05/28/20 0627 05/28/20 1422 05/29/20 0000 05/29/20 0600  BP: 132/70 128/72 127/63 137/74  Pulse: 71 77 80 70  Resp: 20 18  18   Temp: 97.8 F (36.6 C) 98.2 F (36.8 C) 98.4 F (36.9 C) 98.2 F (36.8 C)  TempSrc: Oral  Oral Oral  SpO2: 99% 100% 100% 98%  Weight:      Height:        Intake/Output Summary (Last 24 hours) at 05/29/2020 1325 Last data filed at 05/29/2020 1100 Gross per 24 hour  Intake 300 ml  Output 7075 ml  Net -6775 ml   Filed Weights   05/21/20 1604 05/22/20 0045 05/22/20 1115  Weight: 84.7 kg 120.7 kg 120.7 kg    Examination: General exam: Awake, laying in bed, in nad Respiratory system: Normal respiratory effort, no wheezing Cardiovascular system: regular rate, s1, s2 Gastrointestinal system: Soft, nondistended, positive BS Central nervous system: CN2-12 grossly intact, strength intact Extremities: Perfused, no clubbing Skin: Normal skin turgor, no notable skin lesions  seen Psychiatry: Mood normal // no visual hallucinations    Data Reviewed: I have personally reviewed following labs and imaging studies  CBC: Recent Labs  Lab 05/23/20 0432 05/23/20 2306 05/24/20 0127 05/24/20 0127 05/25/20 0353 05/25/20 0353 05/25/20 0957 05/26/20 0621 05/27/20 0117 05/28/20 0116 05/29/20 0207  WBC 8.1   < > 10.6*   < > 16.1*  --   --  12.4* 11.4* 10.6* 10.5  NEUTROABS 5.4  --  7.3  --  12.5*  --   --  8.7*  --   --   --   HGB 7.4*   < > 8.9*   < >  8.4*   < > 7.7* 7.2* 7.5* 8.3* 8.0*  HCT 23.9*   < > 27.4*   < > 25.5*   < > 22.7* 23.9* 23.4* 25.4* 24.8*  MCV 94.1   < > 92.6   < > 92.4  --   --  98.8 93.2 93.0 93.9  PLT 150   < > 179   < > 175  --   --  145* 124* 131* 142*   < > = values in this interval not displayed.   Basic Metabolic Panel: Recent Labs  Lab 05/23/20 0432 05/23/20 0432 05/24/20 0127 05/24/20 0127 05/25/20 0353 05/26/20 0621 05/27/20 0117 05/28/20 0116 05/29/20 0207  NA 141   < > 141   < > 142 141 141 145 144  K 3.3*   < > 3.7   < > 3.9 3.6 3.1* 3.7 3.8  CL 104   < > 107   < > 107 104 108 111 108  CO2 26   < > 25   < > 24 27 26 26 28   GLUCOSE 84   < > 162*   < > 148* 100* 102* 110* 92  BUN 23   < > 22   < > 19 17 14 11 8   CREATININE 1.08   < > 0.99   < > 1.33* 1.06 0.86 0.69 0.66  CALCIUM 8.1*   < > 8.1*   < > 8.1* 8.3* 7.8* 7.9* 8.0*  MG 1.9  --  1.9  --   --   --   --  1.6*  --    < > = values in this interval not displayed.   GFR: Estimated Creatinine Clearance: 108.6 mL/min (by C-G formula based on SCr of 0.66 mg/dL). Liver Function Tests: Recent Labs  Lab 05/23/20 0432 05/24/20 0127 05/25/20 0353 05/26/20 0621  AST 46* 52* 36 31  ALT 35 44 37 27  ALKPHOS 67 74 88 80  BILITOT 0.8 0.9 0.9 1.0  PROT 4.9* 5.4* 5.4* 5.1*  ALBUMIN 1.6* 1.8* 1.9* 1.8*   No results for input(s): LIPASE, AMYLASE in the last 168 hours. No results for input(s): AMMONIA in the last 168 hours. Coagulation Profile: No results for  input(s): INR, PROTIME in the last 168 hours. Cardiac Enzymes: No results for input(s): CKTOTAL, CKMB, CKMBINDEX, TROPONINI in the last 168 hours. BNP (last 3 results) No results for input(s): PROBNP in the last 8760 hours. HbA1C: No results for input(s): HGBA1C in the last 72 hours. CBG: No results for input(s): GLUCAP in the last 168 hours. Lipid Profile: No results for input(s): CHOL, HDL, LDLCALC, TRIG, CHOLHDL, LDLDIRECT in the last 72 hours. Thyroid Function Tests: No results for input(s): TSH, T4TOTAL, FREET4, T3FREE, THYROIDAB in the last 72 hours. Anemia Panel: No results for input(s): VITAMINB12, FOLATE, FERRITIN, TIBC, IRON, RETICCTPCT in the last 72 hours. Sepsis Labs: Recent Labs  Lab 05/23/20 0432 05/24/20 0127  PROCALCITON 0.11 <0.10    Recent Results (from the past 240 hour(s))  Urine culture     Status: None   Collection Time: 05/21/20  3:33 PM   Specimen: In/Out Cath Urine  Result Value Ref Range Status   Specimen Description IN/OUT CATH URINE  Final   Special Requests NONE  Final   Culture   Final    NO GROWTH Performed at [facility], [place] [address], [place], [place] [address]    Report Status 05/22/2020 FINAL  Final  Resp Panel by RT-PCR (  Flu A&B, Covid) Nasopharyngeal Swab     Status: None   Collection Time: 05/21/20  3:35 PM   Specimen: Nasopharyngeal Swab; Nasopharyngeal(NP) swabs in vial transport medium  Result Value Ref Range Status   SARS Coronavirus 2 by RT PCR NEGATIVE NEGATIVE Final    Comment: (NOTE) SARS-CoV-2 target nucleic acids are NOT DETECTED.  The SARS-CoV-2 RNA is generally detectable in upper respiratory specimens during the acute phase of infection. The lowest concentration of SARS-CoV-2 viral copies this assay can detect is 138 copies/mL. A negative result does not preclude SARS-Cov-2 infection and should not be used as the sole basis for treatment or other patient management decisions. A negative result may occur with   improper specimen collection/handling, submission of specimen other than nasopharyngeal swab, presence of viral mutation(s) within the areas targeted by this assay, and inadequate number of viral copies(<138 copies/mL). A negative result must be combined with clinical observations, patient history, and epidemiological information. The expected result is Negative.  Fact Sheet for Patients:  EntrepreneurPulse.com.au  Fact Sheet for Healthcare Providers:  IncredibleEmployment.be  This test is no t yet approved or cleared by the [place] FDA and  has been authorized for detection and/or diagnosis of SARS-CoV-2 by FDA under an Emergency Use Authorization (EUA). This EUA will remain  in effect (meaning this test can be used) for the duration of the COVID-19 declaration under Section 564(b)(1) of the Act, 21 U.S.C.section 360bbb-3(b)(1), unless the authorization is terminated  or revoked sooner.       Influenza A by PCR NEGATIVE NEGATIVE Final   Influenza B by PCR NEGATIVE NEGATIVE Final    Comment: (NOTE) The Xpert Xpress SARS-CoV-2/FLU/RSV plus assay is intended as an aid in the diagnosis of influenza from Nasopharyngeal swab specimens and should not be used as a sole basis for treatment. Nasal washings and aspirates are unacceptable for Xpert Xpress SARS-CoV-2/FLU/RSV testing.  Fact Sheet for Patients: EntrepreneurPulse.com.au  Fact Sheet for Healthcare Providers: IncredibleEmployment.be  This test is not yet approved or cleared by the [place] FDA and has been authorized for detection and/or diagnosis of SARS-CoV-2 by FDA under an Emergency Use Authorization (EUA). This EUA will remain in effect (meaning this test can be used) for the duration of the COVID-19 declaration under Section 564(b)(1) of the Act, 21 U.S.C. section 360bbb-3(b)(1), unless the authorization is terminated  or revoked.  Performed at [facility], [place] [address] [place], [place] [address]   Blood Culture (routine x 2)     Status: None   Collection Time: 05/21/20  4:40 PM   Specimen: BLOOD  Result Value Ref Range Status   Specimen Description BLOOD SITE NOT SPECIFIED  Final   Special Requests   Final    BOTTLES DRAWN AEROBIC ONLY Blood Culture results may not be optimal due to an inadequate volume of blood received in culture bottles   Culture   Final    NO GROWTH 5 DAYS Performed at [facility], [place] [address] [place], [place] [address]    Report Status 05/26/2020 FINAL  Final  Blood Culture (routine x 2)     Status: None   Collection Time: 05/21/20  4:42 PM   Specimen: BLOOD  Result Value Ref Range Status   Specimen Description BLOOD SITE NOT SPECIFIED  Final   Special Requests   Final    BOTTLES DRAWN AEROBIC ONLY Blood Culture results may not be optimal due to an inadequate volume of blood received in culture  bottles   Culture   Final    NO GROWTH 5 DAYS Performed at [facility], [place] [address] [place], [place] [address]    Report Status 05/26/2020 FINAL  Final     Radiology Studies: No results found.  Scheduled Meds: . Chlorhexidine Gluconate Cloth  6 each Topical Daily  . collagenase   Topical Daily  . docusate sodium  100 mg Oral BID  . ferrous sulfate  325 mg Oral BID WC  .  morphine injection  2 mg Intravenous Once  . oxybutynin  5 mg Oral QHS  . pantoprazole  40 mg Oral QHS  . tamsulosin  0.4 mg Oral Daily   Continuous Infusions: . cefTRIAXone (ROCEPHIN)  IV Stopped (05/29/20 1026)  . sodium chloride irrigation       LOS: 8 days   Marylu Lund, MD [facility] Pager On Amion  If 7PM-7AM, please contact night-coverage 05/29/2020, 1:25 PM

## 2020-05-29 NOTE — Progress Notes (Signed)
Physical Therapy Wound Treatment Patient Details  Name: Shannon [name] MRN: [id number] Date of Birth: [birth year]  Today's Date: 05/29/2020 Time: 0900-0948 Time Calculation (min): 48 min  Subjective  Subjective: Pt pleasant and agreeable to hydro. Alert and talkative today.  Patient and Family Stated Goals: Heal wound Prior Treatments: Dressing changes  Pain Score:  Reports last session (Saturday) was more painful but tolerated treatment well today without complaints of pain.   Wound Assessment  Pressure Injury 05/23/20 Sacrum Unstageable - Full thickness tissue loss in which the base of the injury is covered by slough (yellow, tan, gray, green or brown) and/or eschar (tan, brown or black) in the wound bed. (Active)  Dressing Type ABD;Barrier Film (skin prep);Moist to dry;Gauze (Comment) 05/29/20 0959  Dressing Changed;Clean;Intact;Dry 05/29/20 0959  Dressing Change Frequency Daily 05/29/20 0959  State of Healing Eschar 05/29/20 0959  Site / Wound Assessment Pink;Yellow;Brown 05/29/20 0959  % Wound base Red or Granulating 30% 05/29/20 0959  % Wound base Yellow/Fibrinous Exudate 60% 05/29/20 0959  % Wound base Black/Eschar 10% 05/29/20 0959  % Wound base Other/Granulation Tissue (Comment) 0% 05/29/20 0959  Peri-wound Assessment Intact;Maceration;Pink 05/29/20 0959  Wound Length (cm) 11 cm 05/25/20 1425  Wound Width (cm) 9.5 cm 05/25/20 1425  Wound Depth (cm) 2.5 cm 05/25/20 1425  Wound Surface Area (cm^2) 104.5 cm^2 05/25/20 1425  Wound Volume (cm^3) 261.25 cm^3 05/25/20 1425  Margins Unattached edges (unapproximated) 05/29/20 0959  Drainage Amount Minimal 05/29/20 0959  Drainage Description Serosanguineous 05/29/20 0959  Treatment Debridement (Selective);Hydrotherapy (Pulse lavage);Packing (Saline gauze) 05/29/20 0959  Santyl applied to wound bed prior to applying dressing.   Hydrotherapy Pulsed lavage therapy - wound location: Sacrum Pulsed Lavage with Suction (psi): 8 psi  (8-12) Pulsed Lavage with Suction - Normal Saline Used: 1000 mL Pulsed Lavage Tip: Tip with splash shield Selective Debridement Selective Debridement - Location: Sacrum Selective Debridement - Tools Used: Forceps;Scissors Selective Debridement - Tissue Removed: yellow slough, brown unviable tissue   Wound Assessment and Plan  Wound Therapy - Assess/Plan/Recommendations Wound Therapy - Clinical Statement: Progressing with debridement this session. Noted a small skin tear above wound and placed a foam over top to protect from tape. Will continue to follow for selective removal of unviable tissue, to decrease bioburden, and promote wound bed healing.  Wound Therapy - Functional Problem List: Decreased mobility and global weakness Factors Delaying/Impairing Wound Healing: Immobility Hydrotherapy Plan: Debridement;Dressing change;Patient/family education;Pulsatile lavage with suction Wound Therapy - Frequency: 6X / week Wound Therapy - Follow Up Recommendations: Skilled nursing facility Wound Plan: See above  Wound Therapy Goals- Improve the function of patient's integumentary system by progressing the wound(s) through the phases of wound healing (inflammation - proliferation - remodeling) by: Decrease Necrotic Tissue to: 20% Decrease Necrotic Tissue - Progress: Progressing toward goal Increase Granulation Tissue to: 80% Increase Granulation Tissue - Progress: Progressing toward goal Time For Goal Achievement: 7 days Wound Therapy - Potential for Goals: Fair  Goals will be updated until maximal potential achieved or discharge criteria met.  Discharge criteria: when goals achieved, discharge from hospital, MD decision/surgical intervention, no progress towards goals, refusal/missing three consecutive treatments without notification or medical reason.  GP     Shannon [name] 05/29/2020, 10:04 AM   Shannon [name], PT, DPT Acute Rehabilitation Services Pager: [phone] Office:  [phone]

## 2020-05-29 NOTE — Evaluation (Signed)
Physical Therapy Evaluation Patient Details Name: Shannon [name] MRN: [id number] DOB: 1945/10/26 Today's Date: 05/29/2020   History of Present Illness  Pt is a 74 y/o male with PMH of quadriplegia which follows surgery for Chiari I malformation with syringomyelia surgery in October 2021, HTN, OA. Presenting this admission from SNF with UTI, dehydration and hypotension. Also has several sacral decubitus ulcers.   Clinical Impression  Pt agreeable to therapy evaluation. Pt seems confused regarding his PLOF and equipment. Pt admitted from SNF following previous admission. Pt able to assist with rolling with initiation of movement and AAROM for reaching. Performed rolling R and L multiple times, improved activation rolling R. Pt able to perform exercises in the bed with assist from therapist. Performed horizontal adduction for strengthening to improve participation in rolling. Pt demonstrating deficits in balance, strength, coordination, endurance, safety and knowledge of deficits. Pt will benefit from skilled PT to address deficits to maximize independence with functional mobility prior to discharge.     Follow Up Recommendations SNF    Equipment Recommendations  Hospital bed;Other (comment);Wheelchair (measurements PT);Wheelchair cushion (measurements PT) (lift)    Recommendations for Other Services       Precautions / Restrictions Precautions Precautions: Fall;Cervical Precaution Booklet Issued: No Required Braces or Orthoses: Cervical Brace Cervical Brace: Hard collar;At all times Restrictions Weight Bearing Restrictions: No      Mobility  Bed Mobility Overal bed mobility: Needs Assistance Bed Mobility: Rolling Rolling: Max assist;+2 for physical assistance;+2 for safety/equipment         General bed mobility comments: pt limited due to pain with rolling, unable to progress to sitting    Transfers                 General transfer comment: deferred due to  pain  Ambulation/Gait                Stairs            Wheelchair Mobility    Modified Rankin (Stroke Patients Only)       Balance       Sitting balance - Comments: not attempted                                     Pertinent Vitals/Pain Pain Assessment: 0-10 Pain Score: 6  Pain Location: buttocks and reports "every movement hurts"  Pain Descriptors / Indicators: Discomfort;Grimacing    Home Living Family/patient expects to be discharged to:: Skilled nursing facility Living Arrangements: Alone;Children Available Help at Discharge: Family;Available 24 hours/day Type of Home: House Home Access: Stairs to enter Entrance Stairs-Rails: Right Entrance Stairs-Number of Steps: 5-6 Home Layout: One level Home Equipment: Walker - 4 wheels Additional Comments: Pt has been at [facility] for rehab since last admission. Prior to that he was d/c from CIR and home a short period of time with falls    Prior Function Level of Independence: Needs assistance   Gait / Transfers Assistance Needed: assist from staff at SNF, pt mentioned a power wheelchair but unable to confirm  ADL's / Homemaking Assistance Needed: assist from staff at SNF, not able to feed himself   Comments: pt came from a SNF. pt confused on timeline of when he last ambulated. Pt states daugther might be able to live with him     Hand Dominance   Dominant Hand: Right    Extremity/Trunk Assessment   Upper Extremity Assessment  Upper Extremity Assessment: Defer to OT evaluation    Lower Extremity Assessment Lower Extremity Assessment: RLE deficits/detail;LLE deficits/detail RLE Deficits / Details: knee extension 2+/5, trace hip flexion/abduction, minimal DF LLE Deficits / Details: active DF, active knee ext 3-/5. 2+/5 hip flexion, mild hip adduction pt able to perform SAQ and initiate heel slide on L    Cervical / Trunk Assessment Cervical / Trunk Exceptions: in cervical collar   Communication   Communication: No difficulties  Cognition Arousal/Alertness: Awake/alert Behavior During Therapy: WFL for tasks assessed/performed Overall Cognitive Status: No family/caregiver present to determine baseline cognitive functioning Area of Impairment: Orientation;Following commands;Problem solving;Awareness;Memory                 Orientation Level: Disoriented to;Time Current Attention Level: Focused Memory: Decreased short-term memory Following Commands: Follows one step commands consistently;Follows one step commands with increased time Safety/Judgement: Decreased awareness of safety;Decreased awareness of deficits Awareness: Emergent Problem Solving: Slow processing;Decreased initiation;Difficulty sequencing;Requires verbal cues        General Comments      Exercises General Exercises - Upper Extremity Shoulder Horizontal ADduction: AAROM;Strengthening;Both;10 reps;Supine Elbow Flexion: AAROM;Both;Supine;10 reps General Exercises - Lower Extremity Short Arc Quad: AAROM;Strengthening;Both;15 reps;Supine Heel Slides: AAROM;Strengthening;Both;15 reps;Supine Hip ABduction/ADduction: AAROM;Both;Strengthening;10 reps;Supine   Assessment/Plan    PT Assessment Patient needs continued PT services  PT Problem List Decreased strength;Decreased range of motion;Decreased activity tolerance;Decreased balance;Decreased mobility;Decreased coordination;Cardiopulmonary status limiting activity;Decreased skin integrity       PT Treatment Interventions DME instruction;Gait training;Functional mobility training;Therapeutic activities;Therapeutic exercise;Balance training;Neuromuscular re-education;Patient/family education    PT Goals (Current goals can be found in the Care Plan section)  Acute Rehab PT Goals Patient Stated Goal: To walk again PT Goal Formulation: With patient Time For Goal Achievement: 06/12/20 Potential to Achieve Goals: Fair    Frequency Min  2X/week   Barriers to discharge Inaccessible home environment;Decreased caregiver support      Co-evaluation               AM-PAC PT "6 Clicks" Mobility  Outcome Measure Help needed turning from your back to your side while in a flat bed without using bedrails?: A Lot Help needed moving from lying on your back to sitting on the side of a flat bed without using bedrails?: Total Help needed moving to and from a bed to a chair (including a wheelchair)?: Total Help needed standing up from a chair using your arms (e.g., wheelchair or bedside chair)?: Total Help needed to walk in hospital room?: Total Help needed climbing 3-5 steps with a railing? : Total 6 Click Score: 7    End of Session Equipment Utilized During Treatment: Cervical collar Activity Tolerance: Patient tolerated treatment well;No increased pain Patient left: in bed;with call bell/phone within reach;with bed alarm set Nurse Communication: Mobility status PT Visit Diagnosis: Muscle weakness (generalized) (M62.81);Difficulty in walking, not elsewhere classified (R26.2);Other symptoms and signs involving the nervous system (R29.898);Other (comment)    Time: 0932-3557 PT Time Calculation (min) (ACUTE ONLY): 23 min   Charges:   PT Evaluation $PT Eval Moderate Complexity: 1 Mod PT Treatments $Therapeutic Exercise: 8-22 mins        Ginette Otto, DPT Acute Rehabilitation Services [phone]  Lucretia Field 05/29/2020, 12:18 PM

## 2020-05-29 NOTE — Progress Notes (Signed)
   05/29/20 1600  Clinical Encounter Type  Visited With Patient  Visit Type Spiritual support  Referral From Nurse  Consult/Referral To Chaplain  Chaplain responded. The chaplain provided social support and prayer at bedside. The patient spoke about times he was active in [employer]. The patient is concerned about his daughter worrying about him while he is in the hospital. The patient requested prayer for himself and daughter. This note was prepared by Deneen Harts, M.Div..  For questions please contact by phone [phone].

## 2020-05-29 NOTE — Progress Notes (Signed)
Palliative Medicine RN Note: Rec'd a call from pt's daughter Detroit Frieden called the PMT office. She would like the SW to call her tomorrow at 1300 to discuss MCD/applications. Her number is [phone]. I sent a secure chat to Hollace Kinnier, SW for that unit.  Margret Chance  Grosser, RN, BSN, [facility] Palliative Medicine Team 05/29/2020 1:54 PM Office [phone]

## 2020-05-30 ENCOUNTER — Inpatient Hospital Stay (HOSPITAL_COMMUNITY): Payer: Medicare Other

## 2020-05-30 DIAGNOSIS — N390 Urinary tract infection, site not specified: Secondary | ICD-10-CM | POA: Diagnosis not present

## 2020-05-30 DIAGNOSIS — A419 Sepsis, unspecified organism: Secondary | ICD-10-CM | POA: Diagnosis not present

## 2020-05-30 LAB — CBC
HCT: 27 % — ABNORMAL LOW (ref 39.0–52.0)
Hemoglobin: 8.8 g/dL — ABNORMAL LOW (ref 13.0–17.0)
MCH: 30.6 pg (ref 26.0–34.0)
MCHC: 32.6 g/dL (ref 30.0–36.0)
MCV: 93.8 fL (ref 80.0–100.0)
Platelets: 148 10*3/uL — ABNORMAL LOW (ref 150–400)
RBC: 2.88 MIL/uL — ABNORMAL LOW (ref 4.22–5.81)
RDW: 16.1 % — ABNORMAL HIGH (ref 11.5–15.5)
WBC: 8.8 10*3/uL (ref 4.0–10.5)
nRBC: 0 % (ref 0.0–0.2)

## 2020-05-30 LAB — URINALYSIS, ROUTINE W REFLEX MICROSCOPIC
Bilirubin Urine: NEGATIVE
Glucose, UA: NEGATIVE mg/dL
Ketones, ur: NEGATIVE mg/dL
Leukocytes,Ua: NEGATIVE
Nitrite: NEGATIVE
Protein, ur: NEGATIVE mg/dL
RBC / HPF: >50 RBC/hpf — ABNORMAL HIGH (ref 0–5)
Specific Gravity, Urine: 1.009 (ref 1.005–1.030)
pH: 6 (ref 5.0–8.0)

## 2020-05-30 LAB — BASIC METABOLIC PANEL
Anion gap: 8 (ref 5–15)
BUN: 6 mg/dL — ABNORMAL LOW (ref 8–23)
CO2: 28 mmol/L (ref 22–32)
Calcium: 8 mg/dL — ABNORMAL LOW (ref 8.9–10.3)
Chloride: 106 mmol/L (ref 98–111)
Creatinine, Ser: 0.66 mg/dL (ref 0.61–1.24)
GFR, Estimated: >60 mL/min (ref >60)
Glucose, Bld: 112 mg/dL — ABNORMAL HIGH (ref 70–99)
Potassium: 3.8 mmol/L (ref 3.5–5.1)
Sodium: 142 mmol/L (ref 135–145)

## 2020-05-30 LAB — TROPONIN I (HIGH SENSITIVITY)
Troponin I (High Sensitivity): 40 ng/L — ABNORMAL HIGH (ref <18)
Troponin I (High Sensitivity): 46 ng/L — ABNORMAL HIGH (ref <18)

## 2020-05-30 IMAGING — DX DG CHEST 1V PORT
1 series · 1 of 1 positions shown · non-contrast
Comparison: [DATE], [DATE], [DATE]

CLINICAL DATA: Fever chest pain

EXAM:
PORTABLE CHEST 1 VIEW

[chest ap]
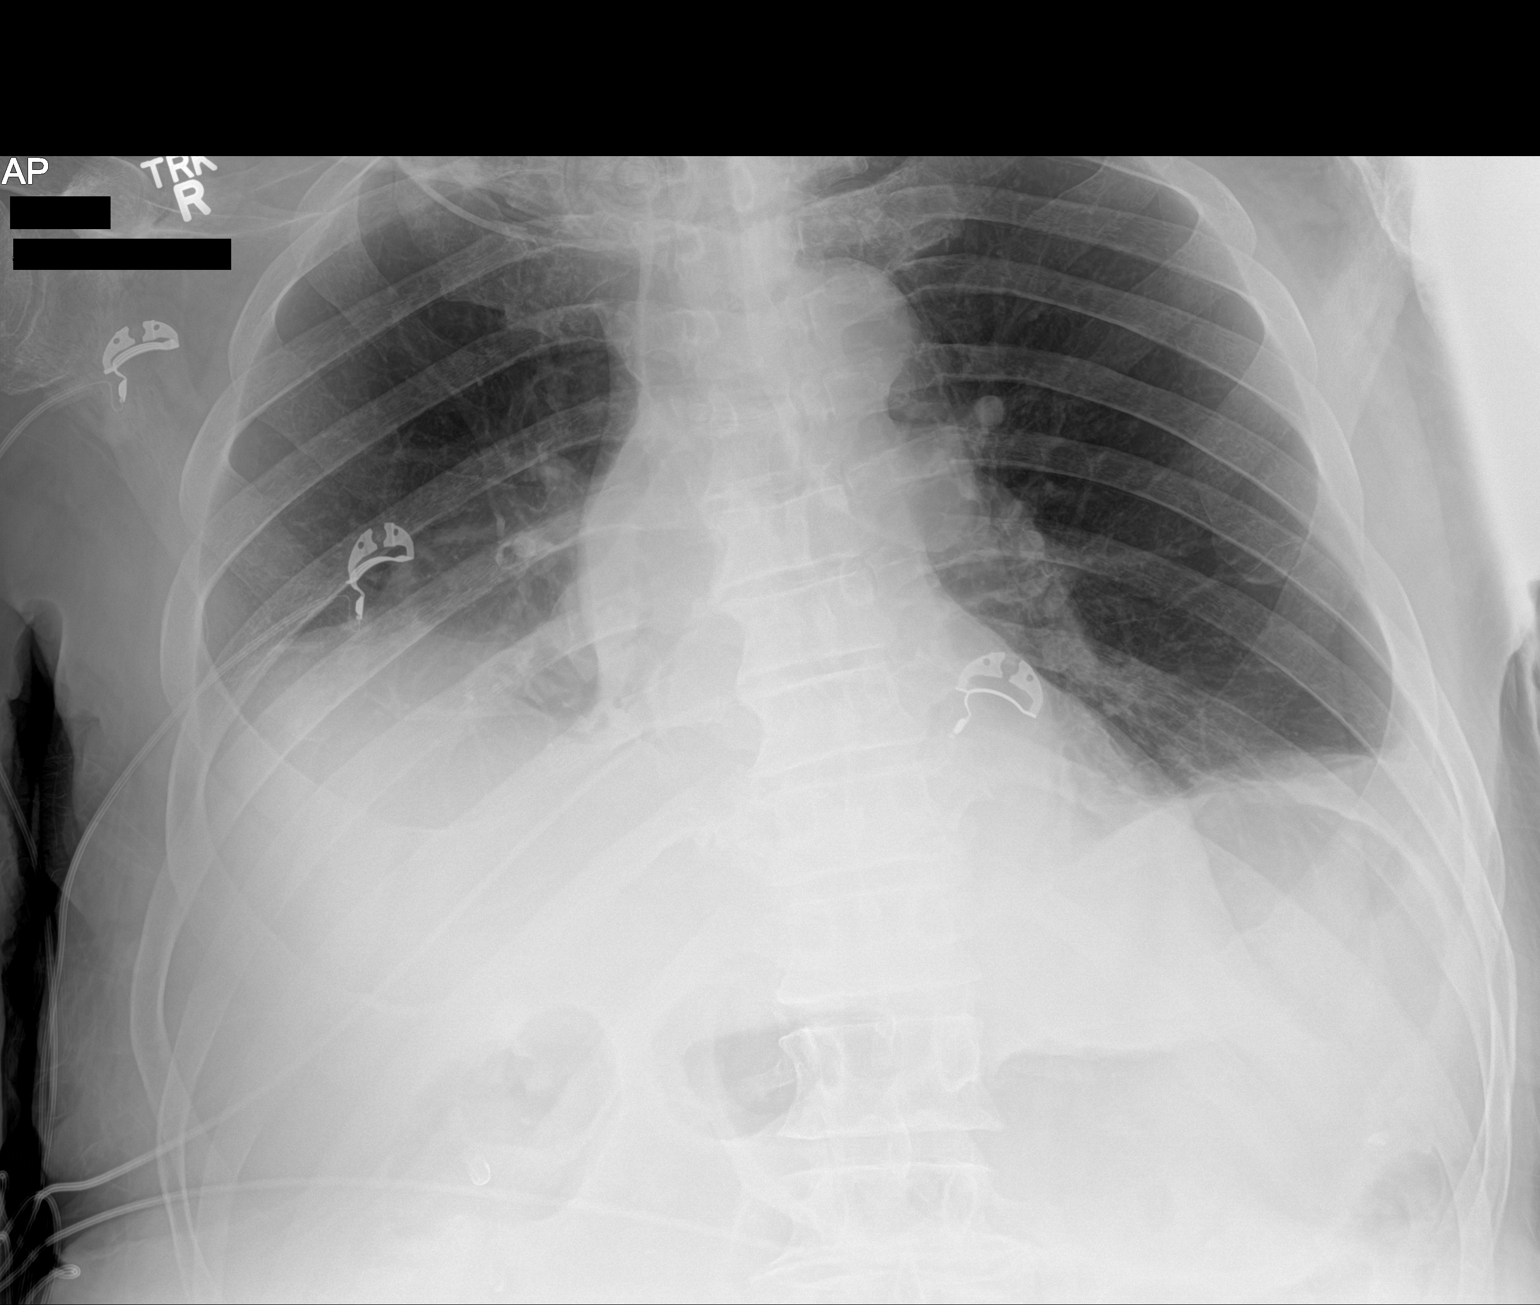

[1 of 1 positions shown; findings below may reference images not displayed]

FINDINGS: Low lung volumes with elevation of right diaphragm. Similar right
greater than left basilar consolidations. Possible small pleural
effusions. Stable cardiomediastinal silhouette. No pneumothorax
IMPRESSION: Similar appearance of right greater than left basilar
consolidations, atelectasis versus pneumonia, with possible small
pleural effusions.

## 2020-05-30 MED ORDER — METOPROLOL TARTRATE 12.5 MG HALF TABLET: 12.5000 mg | ORAL_TABLET | Freq: Two times a day (BID) | ORAL | Status: DC

## 2020-05-30 MED ORDER — SODIUM CHLORIDE 0.9 % IV BOLUS
1000.0000 mL | Freq: Once | INTRAVENOUS | Status: AC
  Administered 2020-05-30: 1000 mL via INTRAVENOUS

## 2020-05-30 MED ORDER — IBUPROFEN 600 MG PO TABS: 600.0000 mg | ORAL_TABLET | Freq: Four times a day (QID) | ORAL | Status: DC | PRN

## 2020-05-30 MED ORDER — LABETALOL HCL 5 MG/ML IV SOLN
5.0000 mg | INTRAVENOUS | Status: DC | PRN
  Administered 2020-05-30: 5 mg via INTRAVENOUS
  Filled 2020-05-30: qty 4

## 2020-05-30 MED ORDER — METOPROLOL TARTRATE 25 MG PO TABS
25.0000 mg | ORAL_TABLET | Freq: Two times a day (BID) | ORAL | Status: DC
  Administered 2020-05-30 – 2020-05-31 (×3): 25 mg via ORAL
  Filled 2020-05-30 (×4): qty 1

## 2020-05-30 MED ORDER — ACETAMINOPHEN 325 MG PO TABS
650.0000 mg | ORAL_TABLET | Freq: Four times a day (QID) | ORAL | Status: DC | PRN
  Administered 2020-05-30: 650 mg via ORAL
  Filled 2020-05-30: qty 2

## 2020-05-30 NOTE — Progress Notes (Signed)
   Providing Compassionate, Quality Care - Together   Subjective: Patient reports no issues. Nurse reports plan is for discharge to SNF, hopefully on 05/31/2020.  Objective: Vital signs in last 24 hours: Temp:  [98.1 F (36.7 C)-99.2 F (37.3 C)] 98.1 F (36.7 C) (12/07 0500) Pulse Rate:  [88-107] 88 (12/07 0500) Resp:  [16-17] 17 (12/07 0500) BP: (133-151)/(67-88) 133/67 (12/07 0500) SpO2:  [99 %-100 %] 100 % (12/07 0500)  Intake/Output from previous day: 12/06 0701 - 12/07 0700 In: 5700 [IV Piggyback:300] Out: 8400 [Urine:8400] Intake/Output this shift: No intake/output data recorded.  Alert and pleasant, oriented to self Densely quadriparetic, but moves all four extremities   Lab Results: Recent Labs    05/29/20 0207 05/30/20 0119  WBC 10.5 8.8  HGB 8.0* 8.8*  HCT 24.8* 27.0*  PLT 142* 148*   BMET Recent Labs    05/29/20 0207 05/30/20 0119  NA 144 142  K 3.8 3.8  CL 108 106  CO2 28 28  GLUCOSE 92 112*  BUN 8 6*  CREATININE 0.66 0.66  CALCIUM 8.0* 8.0*    Studies/Results: No results found.  Assessment/Plan: Patient with sepsis related to urinary tract infection. He is being followed by urology. There are no signs of infection from the ACDF performed in October of this year. Neurosurgery will sign off. Please re-consult if we can be of further assistance.   LOS: 9 days     Val Eagle, DNP, AGNP-C Nurse Practitioner  [facility] [address], [address], [place], [place] [address] P: [phone]    F: [phone]  05/30/2020, 10:55 AM

## 2020-05-30 NOTE — Progress Notes (Signed)
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 Presenting this admission from SNF with UTI, dehydration and hypotension. Also has several sacral decubitus ulcers.    OT comments  Pt making gradual progress towards OT goals this session. Upon OTA arrival, pt requesting assistance with self feeding. Pt required total A for self feeding although introduction of AE for self feeding would beneficial next session as pt able to achieve full elbow flexion on RUE with MIN A to stabilize elbow, however d/t reports of hunger provided total A this session. Pt currently requiring MAX A to reposition in bed. Pt reports wanting to return home however feel SNF most appropriate for pt based on pts current level of assist needed. Pt also reports that his daughter lives in [place] and he lives alone. Pt completed light therex as listed below, however pt limited by pain this session in BUEs. Set up pt with soft touch call bell on pts L side- alerted NT. Pt would continue to benefit from skilled occupational therapy while admitted and after d/c to address the below listed limitations in order to improve overall functional mobility and facilitate independence with BADL participation. DC plan remains appropriate, will follow acutely per POC.     Follow Up Recommendations  SNF;Supervision/Assistance - 24 hour    Equipment Recommendations  None recommended by OT    Recommendations for Other Services  (palliative care)    Precautions / Restrictions Precautions Precautions: Fall;Cervical Precaution Booklet Issued: No Required Braces or Orthoses: Cervical Brace Cervical Brace: Hard collar;At all times Restrictions Weight Bearing Restrictions: No       Mobility  Bed Mobility Overal bed mobility: Needs Assistance Bed Mobility: Rolling Rolling: Max assist         General bed mobility comments: MAX A to reposition in bed  Transfers                 General transfer comment: defer d/t pain and lack of +2 assist    Balance                                           ADL either performed or assessed with clinical judgement   ADL Overall ADL's : Needs assistance/impaired Eating/Feeding: Total assistance;Sitting Eating/Feeding Details (indicate cue type and reason): total A for self feeding this session, however feel could progress to u-cuff with MIN A at elbow to achieve full AROM                                 Functional mobility during ADLs: Maximal assistance (to reposition in bed) General ADL Comments: pt presenting with decreased insight into deficits as pt wanting to go home; education provided on level of care pt currently requiring, unable to mobilize more d/t lack of +2 assist. session focus on self feeding, bed mobility and AROM of BUEs     Vision Patient Visual Report: No change from baseline     Perception     Praxis      Cognition Arousal/Alertness: Awake/alert Behavior During Therapy: WFL for tasks assessed/performed Overall Cognitive Status: No family/caregiver  present to determine baseline cognitive functioning Area of Impairment: Attention;Following commands;Safety/judgement;Awareness                   Current Attention Level: Focused   Following Commands: Follows one step commands consistently;Follows multi-step commands with increased time Safety/Judgement: Decreased awareness of deficits;Decreased awareness of safety Awareness: Emergent Problem Solving: Slow processing;Decreased initiation;Difficulty sequencing;Requires verbal cues General Comments: pt very pleasant and wanting to go home, provided inconsistent prior level of function as pt reprots he is from home and  was walking although per chart review pt from SNF- pt wanting to return home but seems to have decreased insight into deficits        Exercises General Exercises - Upper Extremity Elbow Flexion: AROM;Both;5 reps;Supine Elbow Extension: AROM;Both;5 reps;Supine Wrist Flexion: PROM;Both;5 reps;Supine Wrist Extension: PROM;Both;5 reps;Supine Digit Composite Flexion: Both;5 reps;Supine;AAROM   Shoulder Instructions       General Comments PROM of BUEs; set up soft touch call bell underneath pts L hand    Pertinent Vitals/ Pain       Pain Assessment: Faces Faces Pain Scale: Hurts whole lot Pain Location: BUES with ROM Pain Descriptors / Indicators: Discomfort;Grimacing Pain Intervention(s): Limited activity within patient's tolerance;Monitored during session;Repositioned  Home Living                                          Prior Functioning/Environment              Frequency  Min 2X/week        Progress Toward Goals  OT Goals(current goals can now be found in the care plan section)  Progress towards OT goals: Progressing toward goals  Acute Rehab OT Goals Patient Stated Goal: To walk again OT Goal Formulation: With patient Time For Goal Achievement: 06/09/20 Potential to Achieve Goals: Fair  Plan Discharge plan remains appropriate;Frequency remains appropriate    Co-evaluation                 AM-PAC OT "6 Clicks" Daily Activity     Outcome Measure   Help from another person eating meals?: Total Help from another person taking care of personal grooming?: Total Help from another person toileting, which includes using toliet, bedpan, or urinal?: Total Help from another person bathing (including washing, rinsing, drying)?: Total Help from another person to put on and taking off regular upper body clothing?: Total Help from another person to put on and taking off regular lower body clothing?: Total 6 Click Score: 6    End of Session  Equipment Utilized During Treatment: Cervical collar  OT Visit Diagnosis: Other abnormalities of gait and mobility (R26.89);Muscle weakness (generalized) (M62.81);Pain   Activity Tolerance Patient tolerated treatment well   Patient Left in bed;with call bell/phone within reach;with nursing/sitter in room   Nurse Communication Mobility status        Time: 1856-3149 OT Time Calculation (min): 38 min  Charges: OT General Charges $OT Visit: 1 Visit OT Treatments $Self Care/Home Management : 38-52 mins  Audery Amel., COTA/L Acute Rehabilitation Services [phone]    Angelina Pih 05/30/2020, 10:41 AM

## 2020-05-30 NOTE — NC FL2 (Signed)
[place] MEDICAID FL2 LEVEL OF CARE SCREENING TOOL     IDENTIFICATION  Patient Name: Shannon [name] Birthdate: Nov 11, 1945 Sex: male Admission Date (Current Location): 05/21/2020  [facility] and [place] Number:  Producer, television/film/video and Address:  [facility], [address], [place], [place] [address]      Provider Number: [id number]  Attending Physician Name and Address:  Jerald Kief, MD  Relative Name and Phone Number:  Nold,[name] Daughter   [phone]    Current Level of Care: Hospital Recommended Level of Care: Skilled Nursing Facility Prior Approval Number:    Date Approved/Denied:   PASRR Number: [id number]  Discharge Plan: SNF    Current Diagnoses: Patient Active Problem List   Diagnosis Date Noted  . Palliative care by specialist   . Goals of care, counseling/discussion   . Respiration abnormal   . Respiratory distress   . Protein-calorie malnutrition, severe 05/05/2020  . Pressure injury of skin 05/04/2020  . Pneumonia 05/03/2020  . Sepsis secondary to UTI (HCC) 05/03/2020  . Urinary retention 05/03/2020  . Chronic indwelling Foley catheter 05/03/2020  . 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 Myelopathy (HCC) 04/22/2020  . Quadriplegia and quadriparesis (HCC)   . Acute blood loss anemia   . Slow transit constipation   . Essential hypertension   . Cervical myelopathy (HCC) 04/07/2020  . Quadriparesis (HCC)   . Benign essential HTN   . Post-operative pain   . Neuropathic pain   . Asthma   . 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    Orientation RESPIRATION BLADDER Height & Weight     Self, Situation, Place  Normal Incontinent Weight: 266 lb 1.5 oz (120.7 kg) Height:  6' (182.9 cm)  BEHAVIORAL SYMPTOMS/MOOD NEUROLOGICAL BOWEL NUTRITION STATUS      Incontinent Diet (full liquid)  AMBULATORY STATUS COMMUNICATION OF NEEDS Skin   Total Care Verbally Other (Comment) (open  wound)                       Personal Care Assistance Level of Assistance    Bathing Assistance: Maximum assistance Feeding assistance: Maximum assistance Dressing Assistance: Maximum assistance     Functional Limitations Info    Sight Info: Adequate Hearing Info: Adequate Speech Info: Adequate    SPECIAL CARE FACTORS FREQUENCY  PT (By licensed PT), OT (By licensed OT)     PT Frequency: 5x week OT Frequency: 5x week            Contractures Contractures Info: Not present    Additional Factors Info  Code Status, Allergies Code Status Info: full Allergies Info: lodine, penecillins           Current Medications (05/30/2020):  This is the current hospital active medication list Current Facility-Administered Medications  Medication Dose Route Frequency Provider Last Rate Last Admin  . cefTRIAXone (ROCEPHIN) 1 g in sodium chloride 0.9 % 100 mL IVPB  1 g Intravenous Q24H Osvaldo Shipper, MD   Stopped at 05/29/20 1026  . Chlorhexidine Gluconate Cloth 2 % PADS 6 each  6 each Topical Daily Leroy Sea, MD   6 each at 05/29/20 0957  . collagenase (SANTYL) ointment   Topical Daily Leroy Sea, MD   Given at 05/29/20 [phone]  . diphenhydrAMINE (BENADRYL) injection 25 mg  25 mg Intravenous Q6H PRN Leroy Sea, MD   25 mg at 05/23/20 1756  . docusate sodium (COLACE) capsule 100 mg  100 mg Oral BID Leroy Sea, MD   100 mg at 05/29/20 2143  . ferrous sulfate tablet 325 mg  325 mg Oral BID WC Leroy Sea, MD   325 mg at 05/29/20 1901  . morphine 2 MG/ML injection 2 mg  2 mg Intravenous Q4H PRN Leroy Sea, MD   2 mg at 05/27/20 1416  . morphine 2 MG/ML injection 2 mg  2 mg Intravenous Once Osvaldo Shipper, MD      . ondansetron [facility]) injection 4 mg  4 mg Intravenous Q6H PRN Rometta Emery, MD      . opium-belladonna (B&O) suppository 16.2-60mg   1 suppository Rectal Q8H PRN Crist Fat, MD   1 suppository at 05/25/20 1605  .  oxybutynin (DITROPAN-XL) 24 hr tablet 5 mg  5 mg Oral QHS Zierle-Ghosh, Asia B, DO   5 mg at 05/29/20 2144  . pantoprazole (PROTONIX) EC tablet 40 mg  40 mg Oral QHS Leroy Sea, MD   40 mg at 05/29/20 2144  . Resource ThickenUp Clear   Oral PRN Leroy Sea, MD      . sodium chloride irrigation 0.9 % 3,000 mL  3,000 mL Irrigation Continuous Leroy Sea, MD   3,000 mL at 05/30/20 0444  . tamsulosin (FLOMAX) capsule 0.4 mg  0.4 mg Oral Daily Leroy Sea, MD   0.4 mg at 05/29/20 8469     Discharge Medications: Please see discharge summary for a list of discharge medications.  Relevant Imaging Results:  Relevant Lab Results:   Additional Information SSN#[id number]  Lorri Frederick, LCSW

## 2020-05-30 NOTE — Progress Notes (Signed)
05/30/2020 Patient heart rate was 140's to 150's at 1445 and a 12 lead ekg was order showed simus tachycardia with PVC. A 1740 patient had a temperature of 101.4 orally. Dr Rhona Leavens was made aware and order were written. [facility] RN.

## 2020-05-30 NOTE — Progress Notes (Signed)
PROGRESS NOTE    Shannon [name]  XVQ:[id number] DOB: [birth year] DOA: 05/21/2020 PCP: Tammi Sou, MD    Brief Narrative:  74 y.o.malewith medical history significant ofquadriplegia which follows surgery for Chiari I malformation with syringomyelia surgery in October 2021. Patient had anterior approach for the repair.  He then went home and had a fall from his bed, he was then hospitalized and from here he was sent to a nursing home, he has been quadriplegic since then, he has also developed sacral decubitus ulcers.   Assessment & Plan:   Principal Problem:   Sepsis secondary to UTI [facility]) Active Problems:   Benign essential HTN   Quadriplegia and quadriparesis ([place])   Essential hypertension   Myelopathy (HCC)   Chronic indwelling Foley catheter   Pressure injury of skin   Palliative care by specialist   Goals of care, counseling/discussion   Acute metabolic encephalopathy due to UTI, sepsis and hypotension Head CT was unremarkable.   Suspect secondary to acute infection. Patient was treated with IV antibiotics. Blood and urine cultures without any growth noted.   Mentation seems to be stable and near baseline  Hematuria in the setting of indwelling Foley catheter/urethral injury/UTI Seen by urology due to persistent hematuria.  CBI was initiated.  Patient experiencing bladder spasms.  He is getting B&O suppositories as needed. Patient remains with CBI currently, plan to wean to off per Urology recs as urine clears.  Renal function remains stable.  He remains on ceftriaxone for now.  Cultures have been negative so far.   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 Arnoldo Morale.  Subsequent fall at home with progression to quadriplegia.   Previous rounding MD had  some discussions with the patient during which the patient hinted that he may want to pursue comfort care. On discussions with palliative care, plan to continue full scope of care.    Chronic sacral decubitus ulcers POA Wound care on board. Multiple ulcers unstageable. General surgery was consulted., no debridement needed, continue wound care for now.  Essential hypertension/hypokalemia Blood pressure medications were on hold due to low BP.  Blood pressure remains relatively stable. Cont current plan Repeat lytes in AM  Anemia of chronic disease with some evidence of low B12 and borderline iron deficiency/acute blood loss anemia due to hematuria Continue B12 supplementation and iron.   Pt required 1 unit transfusion on 12/3.  Transfuse also on 11/30.   Hematuria is likely contributing to his drop in hemoglobin.   Recheck cbc in AM, plan to transfuse for hgb <7 Hgb this AM 8.8  Chronic Eliquis use This was started for DVT prophylaxis at the rehab.  Has been discontinued.  Pt was later placed on heparin which was stopped due to hematuria.   Goals of care Palliative care met with patient and family recently with plan to continue full scope of care.  DVT prophylaxis: SCD's Code Status: Full Family Communication: Pt in room, family not at bedside  Status is: Inpatient  Remains inpatient appropriate because:Unsafe d/c plan and Inpatient level of care appropriate due to severity of illness   Dispo: The patient is from: SNF              Anticipated d/c is to: SNF  Anticipated d/c date is: 2 days              Patient currently is not medically stable to d/c. Awaiting dispo planning and discontinuation of CBI   Consultants:   Urology  Procedures:     Antimicrobials: Anti-infectives (From admission, onward)   Start     Dose/Rate Route Frequency Ordered Stop   05/24/20 1415  cefTRIAXone (ROCEPHIN) 1 g in sodium chloride 0.9 % 100 mL IVPB        1 g 200 mL/hr over  30 Minutes Intravenous Every 24 hours 05/24/20 1330     05/23/20 1400  ceFEPIme (MAXIPIME) 2 g in sodium chloride 0.9 % 100 mL IVPB  Status:  Discontinued        2 g 200 mL/hr over 30 Minutes Intravenous Every 8 hours 05/23/20 1011 05/24/20 1330   05/22/20 0500  ceFEPIme (MAXIPIME) 2 g in sodium chloride 0.9 % 100 mL IVPB  Status:  Discontinued        2 g 200 mL/hr over 30 Minutes Intravenous Every 12 hours 05/21/20 1614 05/23/20 1011   05/21/20 2015  aztreonam (AZACTAM) 2 g in sodium chloride 0.9 % 100 mL IVPB        2 g 200 mL/hr over 30 Minutes Intravenous  Once 05/21/20 2010 05/21/20 2230   05/21/20 1730  vancomycin (VANCOREADY) IVPB 1750 mg/350 mL        1,750 mg 175 mL/hr over 120 Minutes Intravenous  Once 05/21/20 1722 05/21/20 2148   05/21/20 1545  ceFEPIme (MAXIPIME) 2 g in sodium chloride 0.9 % 100 mL IVPB        2 g 200 mL/hr over 30 Minutes Intravenous  Once 05/21/20 1535 05/21/20 1704      Subjective: Without complaints this AM  Objective: Vitals:   05/29/20 0600 05/29/20 1452 05/29/20 2300 05/30/20 0500  BP: 137/74 (!) 151/74 135/88 133/67  Pulse: 70 91 (!) 107 88  Resp: _0 Temp: 98.2 F (36.8 C) 99.2 F (37.3 C) 98.5 F (36.9 C) 98.1 F (36.7 C)  TempSrc: Oral  Oral Oral  SpO2: 98% 100% 99% 100%  Weight:      Height:        Intake/Output Summary (Last 24 hours) at 05/30/2020 1431 Last data filed at 05/30/2020 0086 Gross per 24 hour  Intake 5400 ml  Output 7500 ml  Net -2100 ml   Filed Weights   05/21/20 1604 05/22/20 0045 05/22/20 1115  Weight: 84.7 kg 120.7 kg 120.7 kg    Examination: General exam: Conversant, in no acute distress Respiratory system: normal chest rise, clear, no audible wheezing Cardiovascular system: regular rhythm, s1-s2 Gastrointestinal system: Nondistended, nontender, pos BS Central nervous system: No seizures, no tremors Extremities: No cyanosis, no joint deformities Skin: No rashes, no pallor Psychiatry: Affect  normal // no auditory hallucinations   Data Reviewed: I have personally reviewed following labs and imaging studies  CBC: Recent Labs  Lab 05/24/20 0127 05/24/20 0127 05/25/20 0353 05/25/20 0957 05/26/20 7619 05/27/20 0117 05/28/20 0116 05/29/20 0207 05/30/20 0119  WBC 10.6*   < > 16.1*  --  12.4* 11.4* 10.6* 10.5 8.8  NEUTROABS 7.3  --  12.5*  --  8.7*  --   --   --   --   HGB 8.9*   < > 8.4*   < > 7.2* 7.5* 8.3* 8.0* 8.8*  HCT 27.4*   < > 25.5*   < > 23.9* 23.4* 25.4*  24.8* 27.0*  MCV 92.6   < > 92.4  --  98.8 93.2 93.0 93.9 93.8  PLT 179   < > 175  --  145* 124* 131* 142* 148*   < > = values in this interval not displayed.   Basic Metabolic Panel: Recent Labs  Lab 05/24/20 0127 05/25/20 0353 05/26/20 0621 05/27/20 0117 05/28/20 0116 05/29/20 0207 05/30/20 0119  NA 141   < > 141 141 145 144 142  K 3.7   < > 3.6 3.1* 3.7 3.8 3.8  CL 107   < > 104 108 111 108 106  CO2 25   < > _0 GLUCOSE 162*   < > 100* 102* 110* 92 112*  BUN 22   < > _1 6*  CREATININE 0.99   < > 1.06 0.86 0.69 0.66 0.66  CALCIUM 8.1*   < > 8.3* 7.8* 7.9* 8.0* 8.0*  MG 1.9  --   --   --  1.6*  --   --    < > = values in this interval not displayed.   GFR: Estimated Creatinine Clearance: 108.6 mL/min (by C-G formula based on SCr of 0.66 mg/dL). Liver Function Tests: Recent Labs  Lab 05/24/20 0127 05/25/20 0353 05/26/20 0621  AST 52* 36 31  ALT 44 37 27  ALKPHOS 74 88 80  BILITOT 0.9 0.9 1.0  PROT 5.4* 5.4* 5.1*  ALBUMIN 1.8* 1.9* 1.8*   No results for input(s): LIPASE, AMYLASE in the last 168 hours. No results for input(s): AMMONIA in the last 168 hours. Coagulation Profile: No results for input(s): INR, PROTIME in the last 168 hours. Cardiac Enzymes: No results for input(s): CKTOTAL, CKMB, CKMBINDEX, TROPONINI in the last 168 hours. BNP (last 3 results) No results for input(s): PROBNP in the last 8760 hours. HbA1C: No results for input(s): HGBA1C in the last 72  hours. CBG: No results for input(s): GLUCAP in the last 168 hours. Lipid Profile: No results for input(s): CHOL, HDL, LDLCALC, TRIG, CHOLHDL, LDLDIRECT in the last 72 hours. Thyroid Function Tests: No results for input(s): TSH, T4TOTAL, FREET4, T3FREE, THYROIDAB in the last 72 hours. Anemia Panel: No results for input(s): VITAMINB12, FOLATE, FERRITIN, TIBC, IRON, RETICCTPCT in the last 72 hours. Sepsis Labs: Recent Labs  Lab 05/24/20 0127  PROCALCITON <0.10    Recent Results (from the past 240 hour(s))  Urine culture     Status: None   Collection Time: 05/21/20  3:33 PM   Specimen: In/Out Cath Urine  Result Value Ref Range Status   Specimen Description IN/OUT CATH URINE  Final   Special Requests NONE  Final   Culture   Final    NO GROWTH Performed at [facility], [address], [place], [place] [address]    Report Status 05/22/2020 FINAL  Final  Resp Panel by RT-PCR (Flu A&B, Covid) Nasopharyngeal Swab     Status: None   Collection Time: 05/21/20  3:35 PM   Specimen: Nasopharyngeal Swab; Nasopharyngeal(NP) swabs in vial transport medium  Result Value Ref Range Status   SARS Coronavirus 2 by RT PCR NEGATIVE NEGATIVE Final    Comment: (NOTE) SARS-CoV-2 target nucleic acids are NOT DETECTED.  The SARS-CoV-2 RNA is generally detectable in upper respiratory specimens during the acute phase of infection. The lowest concentration of SARS-CoV-2 viral copies this assay can detect is 138 copies/mL. A negative result does not preclude SARS-Cov-2 infection and should not be used  as the sole basis for treatment or other patient management decisions. A negative result may occur with  improper specimen collection/handling, submission of specimen other than nasopharyngeal swab, presence of viral mutation(s) within the areas targeted by this assay, and inadequate number of viral copies(<138 copies/mL). A negative result must be combined with clinical observations, patient  history, and epidemiological information. The expected result is Negative.  Fact Sheet for Patients:  EntrepreneurPulse.com.au  Fact Sheet for Healthcare Providers:  IncredibleEmployment.be  This test is no t yet approved or cleared by the [place] FDA and  has been authorized for detection and/or diagnosis of SARS-CoV-2 by FDA under an Emergency Use Authorization (EUA). This EUA will remain  in effect (meaning this test can be used) for the duration of the COVID-19 declaration under Section 564(b)(1) of the Act, 21 U.S.C.section 360bbb-3(b)(1), unless the authorization is terminated  or revoked sooner.       Influenza A by PCR NEGATIVE NEGATIVE Final   Influenza B by PCR NEGATIVE NEGATIVE Final    Comment: (NOTE) The Xpert Xpress SARS-CoV-2/FLU/RSV plus assay is intended as an aid in the diagnosis of influenza from Nasopharyngeal swab specimens and should not be used as a sole basis for treatment. Nasal washings and aspirates are unacceptable for Xpert Xpress SARS-CoV-2/FLU/RSV testing.  Fact Sheet for Patients: EntrepreneurPulse.com.au  Fact Sheet for Healthcare Providers: IncredibleEmployment.be  This test is not yet approved or cleared by the [place] FDA and has been authorized for detection and/or diagnosis of SARS-CoV-2 by FDA under an Emergency Use Authorization (EUA). This EUA will remain in effect (meaning this test can be used) for the duration of the COVID-19 declaration under Section 564(b)(1) of the Act, 21 U.S.C. section 360bbb-3(b)(1), unless the authorization is terminated or revoked.  Performed at [facility], [place] [address] [place], [place] [address]   Blood Culture (routine x 2)     Status: None   Collection Time: 05/21/20  4:40 PM   Specimen: BLOOD  Result Value Ref Range Status   Specimen Description BLOOD SITE NOT SPECIFIED  Final   Special Requests   Final     BOTTLES DRAWN AEROBIC ONLY Blood Culture results may not be optimal due to an inadequate volume of blood received in culture bottles   Culture   Final    NO GROWTH 5 DAYS Performed at [facility], [place] [address], [place], [place] [address]    Report Status 05/26/2020 FINAL  Final  Blood Culture (routine x 2)     Status: None   Collection Time: 05/21/20  4:42 PM   Specimen: BLOOD  Result Value Ref Range Status   Specimen Description BLOOD SITE NOT SPECIFIED  Final   Special Requests   Final    BOTTLES DRAWN AEROBIC ONLY Blood Culture results may not be optimal due to an inadequate volume of blood received in culture bottles   Culture   Final    NO GROWTH 5 DAYS Performed at [facility], [place] [address], [place], [place] [address]    Report Status 05/26/2020 FINAL  Final     Radiology Studies: No results found.  Scheduled Meds: . Chlorhexidine Gluconate Cloth  6 each Topical Daily  . collagenase   Topical Daily  . docusate sodium  100 mg Oral BID  . ferrous sulfate  325 mg Oral BID WC  .  morphine injection  2 mg Intravenous Once  . oxybutynin  5 mg Oral QHS  .  pantoprazole  40 mg Oral QHS  . tamsulosin  0.4 mg Oral Daily   Continuous Infusions: . cefTRIAXone (ROCEPHIN)  IV 1 g (05/30/20 1110)  . sodium chloride irrigation       LOS: 9 days   Marylu Lund, MD [facility] Pager On Amion  If 7PM-7AM, please contact night-coverage 05/30/2020, 2:31 PM

## 2020-05-30 NOTE — Progress Notes (Signed)
Physical Therapy Wound Treatment Patient Details  Name: Shannon [name] MRN: [id number] Date of Birth: [birth year]  Today's Date: 05/30/2020 Time: 1204-1239 Time Calculation (min): 35 min  Subjective  Subjective: Pt pleasant and agreeable to hydro. Alert and talkative today.  Patient and Family Stated Goals: Heal wound Prior Treatments: Dressing changes  Pain Score:  Tolerated treatment well with minimal pain reported with dressing removal but no pain reported during pulse lavage or debridement.   Wound Assessment  Pressure Injury 05/23/20 Sacrum Unstageable - Full thickness tissue loss in which the base of the injury is covered by slough (yellow, tan, gray, green or brown) and/or eschar (tan, brown or black) in the wound bed. (Active)  Wound Image   05/30/20 1454  Dressing Type ABD;Barrier Film (skin prep);Gauze (Comment);Moist to dry 05/30/20 1454  Dressing Changed;Clean;Dry;Intact 05/30/20 1454  Dressing Change Frequency Daily 05/30/20 1454  State of Healing Non-healing 05/30/20 1454  Site / Wound Assessment Red;Brown;Yellow 05/30/20 1454  % Wound base Red or Granulating 40% 05/30/20 1454  % Wound base Yellow/Fibrinous Exudate 50% 05/30/20 1454  % Wound base Black/Eschar 10% 05/30/20 1454  % Wound base Other/Granulation Tissue (Comment) 0% 05/30/20 1454  Peri-wound Assessment Intact;Pink 05/30/20 1454  Wound Length (cm) 11 cm 05/30/20 1206  Wound Width (cm) 9.5 cm 05/30/20 1206  Wound Depth (cm) 3.2 cm 05/30/20 1206  Wound Surface Area (cm^2) 104.5 cm^2 05/30/20 1206  Wound Volume (cm^3) 334.4 cm^3 05/30/20 1206  Margins Unattached edges (unapproximated) 05/30/20 1454  Drainage Amount Minimal 05/30/20 1454  Drainage Description Serosanguineous 05/30/20 1454  Treatment Debridement (Selective);Hydrotherapy (Pulse lavage);Packing (Saline gauze) 05/30/20 1454  Santyl applied to wound bed prior to applying dressing.   Hydrotherapy Pulsed lavage therapy - wound location:  Sacrum Pulsed Lavage with Suction (psi): 8 psi (8-12) Pulsed Lavage with Suction - Normal Saline Used: 1000 mL Pulsed Lavage Tip: Tip with splash shield Selective Debridement Selective Debridement - Location: Sacrum Selective Debridement - Tools Used: Forceps;Scissors Selective Debridement - Tissue Removed: yellow slough, brown unviable tissue   Wound Assessment and Plan  Wound Therapy - Assess/Plan/Recommendations Wound Therapy - Clinical Statement: Progressing with debridement this session. Will continue to follow for selective removal of unviable tissue, to decrease bioburden, and promote wound bed healing.  Wound Therapy - Functional Problem List: Decreased mobility and global weakness Factors Delaying/Impairing Wound Healing: Immobility Hydrotherapy Plan: Debridement;Dressing change;Patient/family education;Pulsatile lavage with suction Wound Therapy - Frequency: 6X / week Wound Therapy - Follow Up Recommendations: Skilled nursing facility Wound Plan: See above  Wound Therapy Goals- Improve the function of patient's integumentary system by progressing the wound(s) through the phases of wound healing (inflammation - proliferation - remodeling) by: Decrease Necrotic Tissue to: 20% Decrease Necrotic Tissue - Progress: Progressing toward goal Increase Granulation Tissue to: 80% Increase Granulation Tissue - Progress: Progressing toward goal Time For Goal Achievement: 7 days Wound Therapy - Potential for Goals: Fair  Goals will be updated until maximal potential achieved or discharge criteria met.  Discharge criteria: when goals achieved, discharge from hospital, MD decision/surgical intervention, no progress towards goals, refusal/missing three consecutive treatments without notification or medical reason.  GP     Thelma Comp 05/30/2020, 2:58 PM   Shannon [name], PT, DPT Acute Rehabilitation Services Pager: [phone] Office: [phone]

## 2020-05-30 NOTE — TOC Initial Note (Addendum)
Transition of Care [facility]) - Initial/Assessment Note    Patient Details  Name: Shannon [name] MRN: [id number] Date of Birth: 1946-05-22  [facility]) CM/SW Contact:    Lorri Frederick, LCSW Phone Number: 05/30/2020, 3:45 PM  Clinical Narrative:   CSW attempted to meet with pt regarding discharge plan, however pt quite confused, not able to provide information.  CSW LM for daughter [name] who called back later.  Pt has been staying at [facility], notes stated daughter unhappy there but [name] reports she was unhappy because she was not notified when they were sending him back to the hospital, otherwise she has been satisfied with the care at [facility] and is OK with plan being for pt to return.  She had questions about assistance in applying for medicaid and CSW discussed going to [facility] and initiating that process.    1500: confirmation received from [place] at [address] that pt can return when ready for discharge.              Expected Discharge Plan: Skilled Nursing Facility Barriers to Discharge: Continued Medical Work up   Patient Goals and CMS Choice   CMS Medicare.gov Compare Post Acute Care list provided to::  (daughter OK with pt returning to [facility])    Expected Discharge Plan and Services Expected Discharge Plan: Skilled Nursing Facility       Living arrangements for the past 2 months: Skilled Nursing Facility [facility])                                      Prior Living Arrangements/Services Living arrangements for the past 2 months: Skilled Nursing Facility Psychiatric nurse) Lives with:: Facility Resident Patient language and need for interpreter reviewed:: Yes        Need for Family Participation in Patient Care: Yes (Comment) Care giver support system in place?: Yes (comment)   Criminal Activity/Legal Involvement Pertinent to Current Situation/Hospitalization: No - Comment as needed  Activities of Daily Living Home Assistive  Devices/Equipment: C-collar ADL Screening (condition at time of admission) Patient's cognitive ability adequate to safely complete daily activities?: Yes Is the patient deaf or have difficulty hearing?: No Does the patient have difficulty seeing, even when wearing glasses/contacts?: No Does the patient have difficulty concentrating, remembering, or making decisions?: Yes Patient able to express need for assistance with ADLs?: Yes Does the patient have difficulty dressing or bathing?: Yes Independently performs ADLs?: No Communication: Independent Dressing (OT): Dependent Is this a change from baseline?: Pre-admission baseline Grooming: Dependent Is this a change from baseline?: Pre-admission baseline Feeding: Dependent Is this a change from baseline?: Pre-admission baseline Bathing: Dependent Is this a change from baseline?: Pre-admission baseline Toileting: Dependent Is this a change from baseline?: Pre-admission baseline In/Out Bed: Dependent Is this a change from baseline?: Pre-admission baseline Walks in Home: Dependent Is this a change from baseline?: Pre-admission baseline Does the patient have difficulty walking or climbing stairs?: Yes Weakness of Legs: Both Weakness of Arms/Hands: Both  Permission Sought/Granted                  Emotional Assessment Appearance:: Appears stated age Attitude/Demeanor/Rapport: Unable to Assess Affect (typically observed): Unable to Assess Orientation: : Oriented to Self, Oriented to Place, Oriented to  Time, Oriented to Situation Alcohol / Substance Use: Not Applicable Psych Involvement: No (comment)  Admission diagnosis:  Sepsis secondary to UTI (HCC) [A41.9, N39.0]  Sepsis with encephalopathy and septic shock, due to unspecified organism (HCC) [A41.9, R65.21, G93.40] Patient Active Problem List   Diagnosis Date Noted  . Palliative care by specialist   . Goals of care, counseling/discussion   . Respiration abnormal   .  Respiratory distress   . Protein-calorie malnutrition, severe 05/05/2020  . Pressure injury of skin 05/04/2020  . Pneumonia 05/03/2020  . Sepsis secondary to UTI (HCC) 05/03/2020  . Urinary retention 05/03/2020  . Chronic indwelling Foley catheter 05/03/2020  . 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 Myelopathy (HCC) 04/22/2020  . Quadriplegia and quadriparesis (HCC)   . Acute blood loss anemia   . Slow transit constipation   . Essential hypertension   . Cervical myelopathy (HCC) 04/07/2020  . Quadriparesis (HCC)   . Benign essential HTN   . Post-operative pain   . Neuropathic pain   . Asthma   . 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   PCP:  [name], MD Pharmacy:  No Pharmacies Listed    Social Determinants of Health (SDOH) Interventions    Readmission Risk Interventions No flowsheet data found.

## 2020-05-31 ENCOUNTER — Inpatient Hospital Stay (HOSPITAL_COMMUNITY): Payer: Medicare Other

## 2020-05-31 DIAGNOSIS — A419 Sepsis, unspecified organism: Secondary | ICD-10-CM | POA: Diagnosis not present

## 2020-05-31 DIAGNOSIS — N390 Urinary tract infection, site not specified: Secondary | ICD-10-CM | POA: Diagnosis not present

## 2020-05-31 LAB — CBC
HCT: 25.2 % — ABNORMAL LOW (ref 39.0–52.0)
Hemoglobin: 8.2 g/dL — ABNORMAL LOW (ref 13.0–17.0)
MCH: 30 pg (ref 26.0–34.0)
MCHC: 32.5 g/dL (ref 30.0–36.0)
MCV: 92.3 fL (ref 80.0–100.0)
Platelets: 133 10*3/uL — ABNORMAL LOW (ref 150–400)
RBC: 2.73 MIL/uL — ABNORMAL LOW (ref 4.22–5.81)
RDW: 15.7 % — ABNORMAL HIGH (ref 11.5–15.5)
WBC: 7.5 10*3/uL (ref 4.0–10.5)
nRBC: 0 % (ref 0.0–0.2)

## 2020-05-31 LAB — GLUCOSE, CAPILLARY
Glucose-Capillary: 102 mg/dL — ABNORMAL HIGH (ref 70–99)
Glucose-Capillary: 113 mg/dL — ABNORMAL HIGH (ref 70–99)
Glucose-Capillary: 139 mg/dL — ABNORMAL HIGH (ref 70–99)

## 2020-05-31 LAB — BASIC METABOLIC PANEL
Anion gap: 8 (ref 5–15)
BUN: 7 mg/dL — ABNORMAL LOW (ref 8–23)
CO2: 25 mmol/L (ref 22–32)
Calcium: 7.4 mg/dL — ABNORMAL LOW (ref 8.9–10.3)
Chloride: 105 mmol/L (ref 98–111)
Creatinine, Ser: 0.67 mg/dL (ref 0.61–1.24)
GFR, Estimated: >60 mL/min (ref >60)
Glucose, Bld: 115 mg/dL — ABNORMAL HIGH (ref 70–99)
Potassium: 3.8 mmol/L (ref 3.5–5.1)
Sodium: 138 mmol/L (ref 135–145)

## 2020-05-31 IMAGING — DX DG CHEST 2V
2 series · 2 of 2 positions shown · non-contrast
Comparison: [DATE]

CLINICAL DATA: Fever, hypertension, former smoker

EXAM:
CHEST - 2 VIEW

[chest ap]
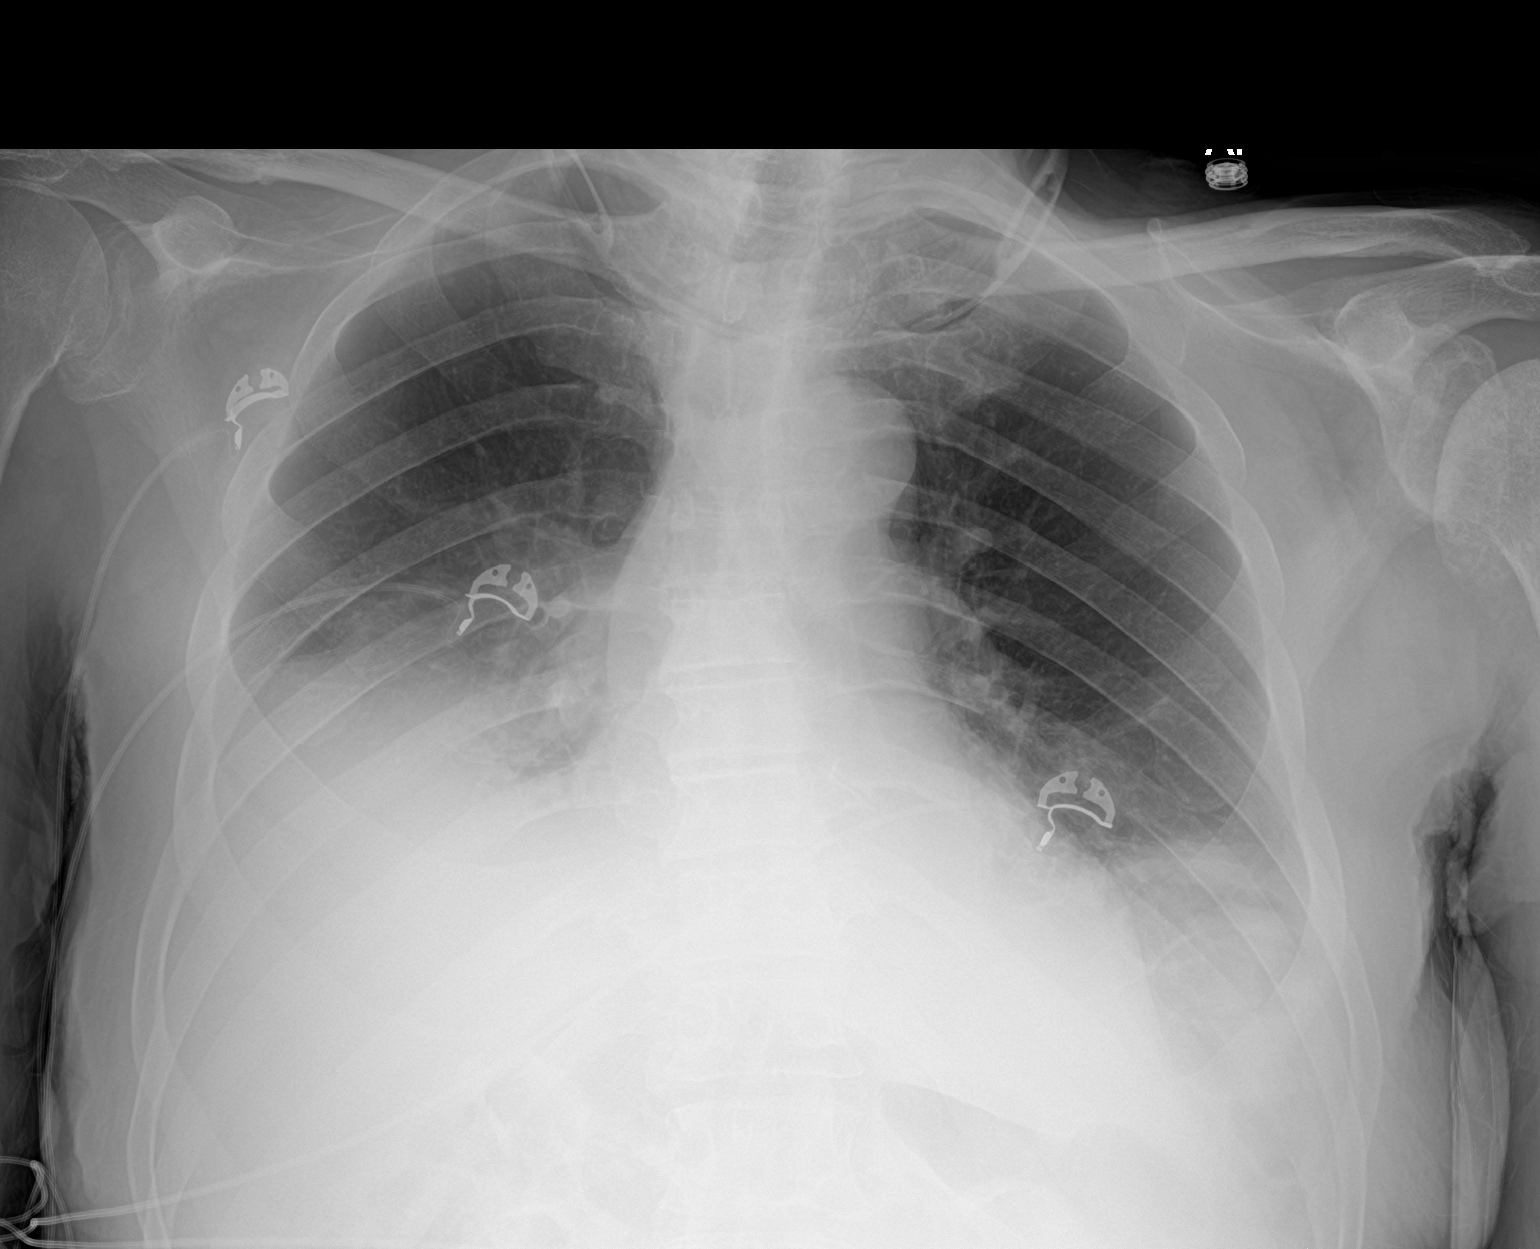

[chest lat]
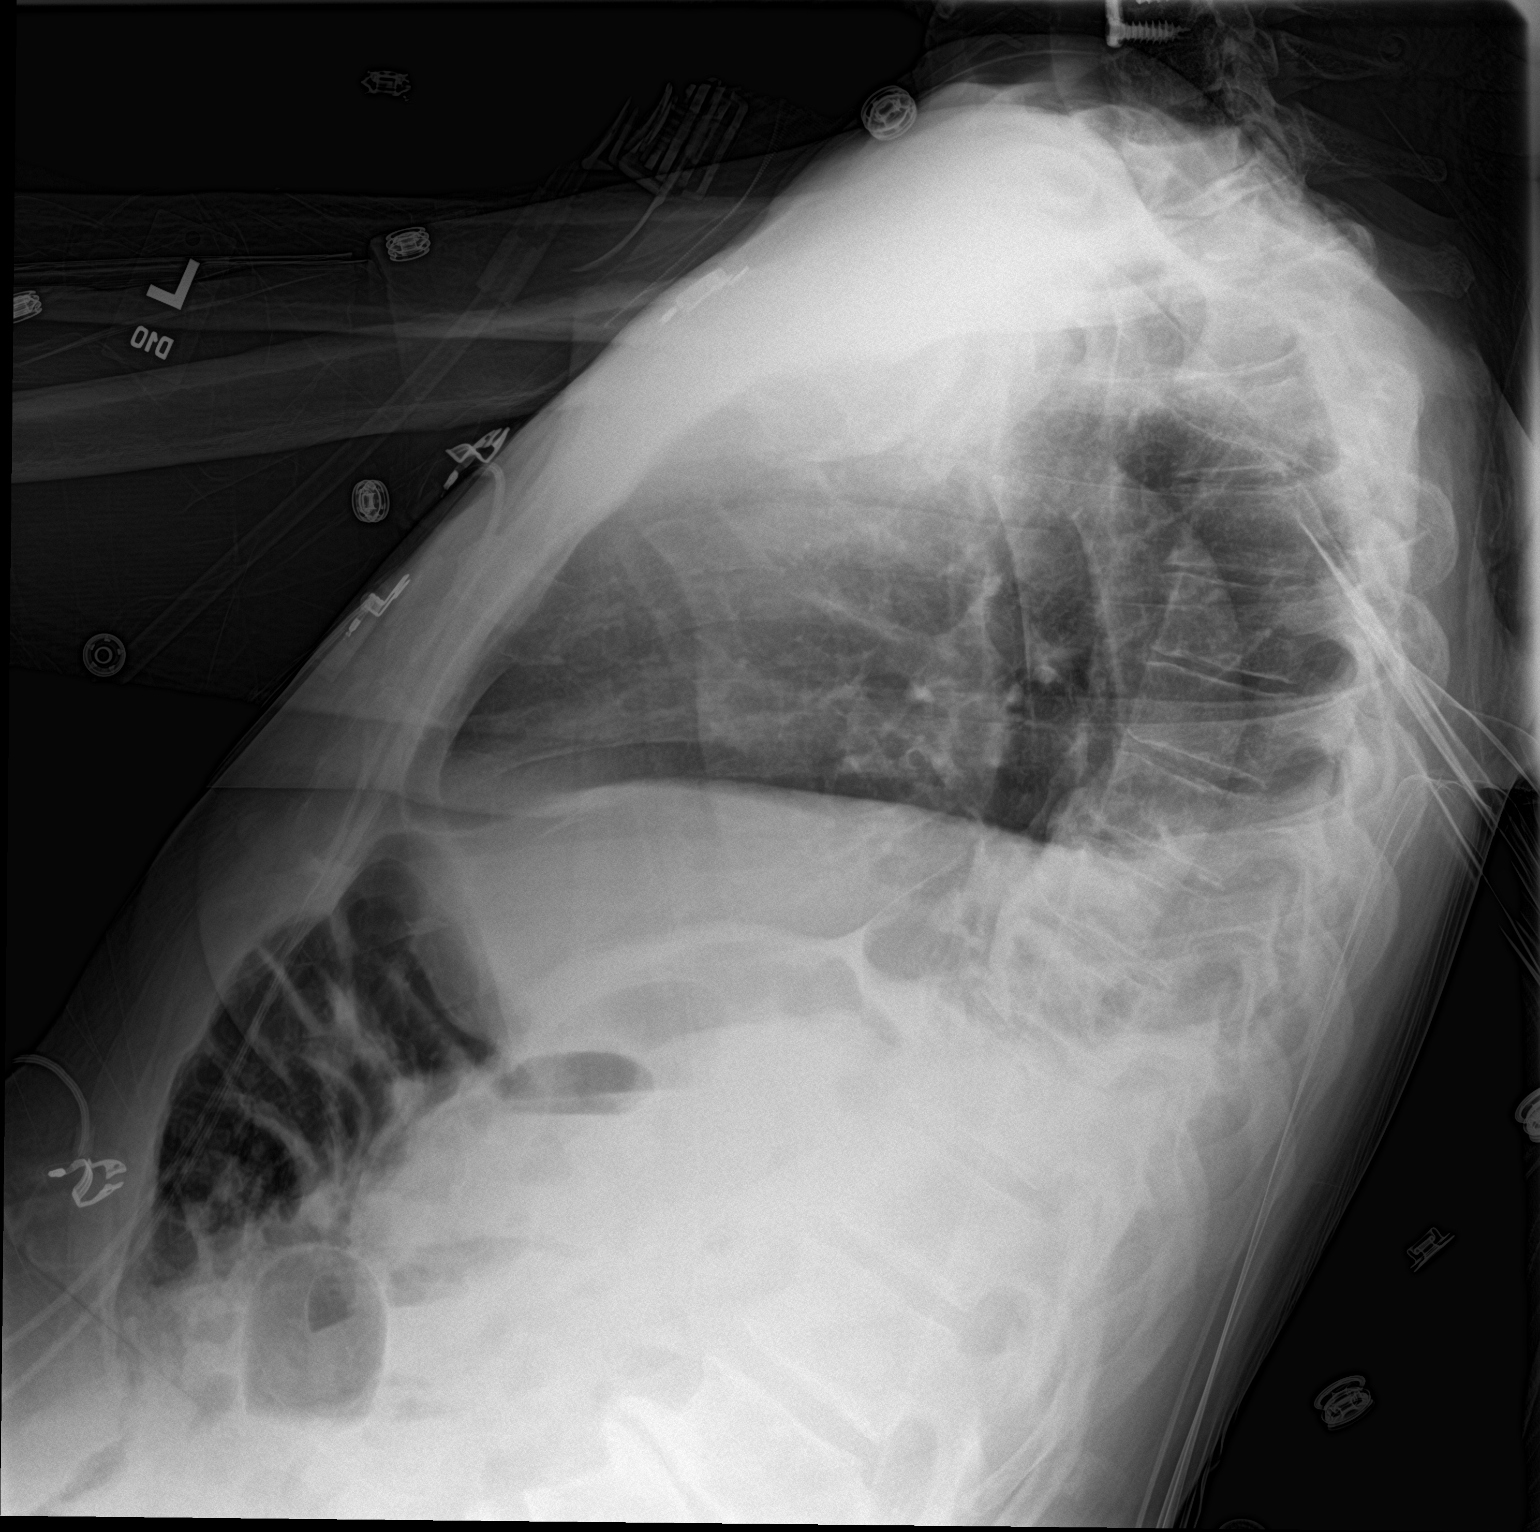

[2 of 2 positions shown; findings below may reference images not displayed]

FINDINGS: Normal heart size, mediastinal contours, and pulmonary vascularity.

RIGHT greater than LEFT basilar opacities question pneumonia versus
atelectasis little changed.

Small bibasilar effusions larger on RIGHT.

No pneumothorax.

Degenerative changes of LEFT glenohumeral joint.
IMPRESSION: Persistent bibasilar opacities question atelectasis versus pneumonia
with small bibasilar effusions, both greater on RIGHT.

## 2020-05-31 MED ORDER — SODIUM CHLORIDE 0.9 % IV SOLN
2.0000 g | Freq: Three times a day (TID) | INTRAVENOUS | Status: DC
  Administered 2020-05-31 – 2020-06-06 (×17): 2 g via INTRAVENOUS
  Filled 2020-05-31 (×19): qty 2

## 2020-05-31 NOTE — Plan of Care (Signed)
Patient denies pain or discomfort at this time. Patient remains on specailty bed. Safety precautions maintained.

## 2020-05-31 NOTE — Progress Notes (Signed)
Physical Therapy Wound Treatment Patient Details  Name: Sundeep Destin MRN: [id number] Date of Birth: [birth year]  Today's Date: 05/31/2020 Time: 1138-1225 Time Calculation (min): 47 min  Subjective  Subjective: Pt pleasant and agreeable to hydro. Alert and talkative today.  Patient and Family Stated Goals: Heal wound Prior Treatments: Dressing changes  Pain Score:  Pt reports mild pain with dressing removal  Wound Assessment  Pressure Injury 05/23/20 Sacrum Unstageable - Full thickness tissue loss in which the base of the injury is covered by slough (yellow, tan, gray, green or brown) and/or eschar (tan, brown or black) in the wound bed. (Active)  Dressing Type ABD;Barrier Film (skin prep);Gauze (Comment);Moist to dry; non-adherent 05/31/20 1419  Dressing Changed;Clean;Dry;Intact 05/31/20 1419  Dressing Change Frequency Daily 05/31/20 1419  State of Healing Non-healing 05/31/20 1419  Site / Wound Assessment Red;Brown;Yellow 05/31/20 1419  % Wound base Red or Granulating 40% 05/31/20 1419  % Wound base Yellow/Fibrinous Exudate 50% 05/31/20 1419  % Wound base Black/Eschar 10% 05/31/20 1419  % Wound base Other/Granulation Tissue (Comment) 0% 05/31/20 1419  Peri-wound Assessment Intact;Pink 05/31/20 1419  Wound Length (cm) 11 cm 05/30/20 1206  Wound Width (cm) 9.5 cm 05/30/20 1206  Wound Depth (cm) 3.2 cm 05/30/20 1206  Wound Surface Area (cm^2) 104.5 cm^2 05/30/20 1206  Wound Volume (cm^3) 334.4 cm^3 05/30/20 1206  Margins Unattached edges (unapproximated) 05/30/20 1454  Drainage Amount Minimal 05/31/20 1419  Drainage Description Serosanguineous 05/31/20 1419  Treatment Debridement (Selective);Hydrotherapy (Pulse lavage);Packing (Saline gauze) 05/31/20 1419  Santyl applied to wound bed prior to applying dressing.   Hydrotherapy Pulsed lavage therapy - wound location: Sacrum Pulsed Lavage with Suction (psi): 8 psi (8-12) Pulsed Lavage with Suction - Normal Saline Used: 1000  mL Pulsed Lavage Tip: Tip with splash shield Selective Debridement Selective Debridement - Location: Sacrum Selective Debridement - Tools Used: Forceps;Scissors Selective Debridement - Tissue Removed: yellow slough, brown unviable tissue   Wound Assessment and Plan  Wound Therapy - Assess/Plan/Recommendations Wound Therapy - Clinical Statement: Progressing with debridement this session. Will continue to follow for selective removal of unviable tissue, to decrease bioburden, and promote wound bed healing.  Wound Therapy - Functional Problem List: Decreased mobility and global weakness Factors Delaying/Impairing Wound Healing: Immobility Hydrotherapy Plan: Debridement;Dressing change;Patient/family education;Pulsatile lavage with suction Wound Therapy - Frequency: 6X / week Wound Therapy - Follow Up Recommendations: Skilled nursing facility Wound Plan: See above  Wound Therapy Goals- Improve the function of patient's integumentary system by progressing the wound(s) through the phases of wound healing (inflammation - proliferation - remodeling) by: Decrease Necrotic Tissue to: 20% Decrease Necrotic Tissue - Progress: Progressing toward goal Increase Granulation Tissue to: 80% Increase Granulation Tissue - Progress: Progressing toward goal Time For Goal Achievement: 7 days Wound Therapy - Potential for Goals: Fair  Goals will be updated until maximal potential achieved or discharge criteria met.  Discharge criteria: when goals achieved, discharge from hospital, MD decision/surgical intervention, no progress towards goals, refusal/missing three consecutive treatments without notification or medical reason.  GP     Thelma Comp 05/31/2020, 2:21 PM   Rolinda Roan, PT, DPT Acute Rehabilitation Services Pager: [phone] Office: [phone]

## 2020-05-31 NOTE — Progress Notes (Signed)
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 -Subsequently hospitalized with aspiration pneumonia, then went to SNF -Has unstageable decubitus wounds, urinary retention -Readmitted 11/28 with septic shock, severe frank hematuria and UTI -Treated with antibiotics, started continuous bladder irrigation -Also seen by palliative care this admission -12/7 with fever and tachycardia  Assessment & Plan:    Septic shock  Acute metabolic encephalopathy due to UTI, sepsis and hypotension -Blood pressure stable and improved, completed ceftriaxone course  Frank hematuria -In the setting of indwelling Foley catheter/urethral injury and UTI -Seen by urology for persistent frank hematuria -Started CBI -Appears to be improving however, per staff RN this morning-he had frank hematuria again, CBI continued -Will need Foley catheter at discharge, urology following as needed -Completed 10-day course of ceftriaxone today  Suspected aspiration pneumonia -Fever and tachycardia on 12/7, reports slight worsening cough -Chest x-ray with persistent lower lobe opacities -Start antibiotics for aspiration pneumonia, SLP following  Cervical myelopathy, progressive quadriparesis -Had C-spine surgery on 10/13/[facility] -Subsequently had a fall at home developed epidural hematoma, underwent reexploration, removal of hardware, evacuation of hematoma on 10/30 -Now quadriplegic with dysphagia, unstageable sacral decubitus wounds, failure to thrive, recurrent hospitalizations -Palliative care consulted for goals of care, patient family wishes for full scope of treatment  Chronic sacral decubitus ulcers POA Large sacral decubitus wound which is unstageable. -General surgery was  consulted., no debridement needed, getting hydrotherapy  Essential hypertension/hypokalemia Blood pressure medications were on hold due to low BP.  Blood pressure remains relatively stable.   Anemia of chronic disease with some evidence of low B12 and borderline iron deficiency/acute blood loss anemia due to hematuria Continue B12 supplementation and iron.   -Transfused 2 units of PRBC this admission -Monitor, transfuse for hemoglobin less than 7  Chronic Eliquis use This was started for DVT prophylaxis at the rehab.  Has been discontinued.  Pt was later placed on heparin which was stopped due to hematuria.   Goals of care Palliative care met with patient and family recently with plan to continue full scope of care.  DVT prophylaxis: SCD's Code Status: Full Family Communication: Niece at bedside  Status is: Inpatient  Remains inpatient appropriate because:Unsafe d/c plan and Inpatient level of care appropriate due to severity of illness, persistent hematuria and now aspiration pneumonia   Dispo: The patient is from: SNF              Anticipated d/c is to: SNF              Anticipated d/c date is: 2-3days if stable              Patient currently is not medically stable to d/c.   Consultants:   Urology  Procedures:     Antimicrobials: Anti-infectives (From admission, onward)   Start     Dose/Rate Route Frequency Ordered Stop   05/24/20 1415  cefTRIAXone (ROCEPHIN) 1 g in sodium chloride 0.9 % 100 mL IVPB        1 g 200 mL/hr over 30 Minutes Intravenous Every 24 hours 05/24/20 1330 05/31/20 1110   05/23/20 1400  ceFEPIme (MAXIPIME) 2 g in sodium chloride 0.9 % 100 mL IVPB  Status:  Discontinued        2 g 200 mL/hr over 30 Minutes Intravenous Every 8  hours 05/23/20 1011 05/24/20 1330   05/22/20 0500  ceFEPIme (MAXIPIME) 2 g in sodium chloride 0.9 % 100 mL IVPB  Status:  Discontinued        2 g 200 mL/hr over 30 Minutes Intravenous Every 12 hours 05/21/20 1614  05/23/20 1011   05/21/20 2015  aztreonam (AZACTAM) 2 g in sodium chloride 0.9 % 100 mL IVPB        2 g 200 mL/hr over 30 Minutes Intravenous  Once 05/21/20 2010 05/21/20 2230   05/21/20 1730  vancomycin (VANCOREADY) IVPB 1750 mg/350 mL        1,750 mg 175 mL/hr over 120 Minutes Intravenous  Once 05/21/20 1722 05/21/20 2148   05/21/20 1545  ceFEPIme (MAXIPIME) 2 g in sodium chloride 0.9 % 100 mL IVPB        2 g 200 mL/hr over 30 Minutes Intravenous  Once 05/21/20 1535 05/21/20 1704      Subjective: Without complaints this AM  Objective: Vitals:   05/30/20 2136 05/30/20 2200 05/31/20 0534 05/31/20 0551  BP: (!) 88/54 105/62 107/63   Pulse: (!) 102 (!) 105 (!) 113 (!) 104  Resp: 18  17   Temp: 99.9 F (37.7 C)  98.8 F (37.1 C)   TempSrc: Oral  Oral   SpO2: 98%  97%   Weight:      Height:        Intake/Output Summary (Last 24 hours) at 05/31/2020 1239 Last data filed at 05/31/2020 0800 Gross per 24 hour  Intake 1000 ml  Output 3400 ml  Net -2400 ml   Filed Weights   05/21/20 1604 05/22/20 0045 05/22/20 1115  Weight: 84.7 kg 120.7 kg 120.7 kg    Examination: General exam: Chronically ill elderly male laying in bed, awake alert oriented to self and place HEENT: Hard cervical collar noted CVS: S1-S2, regular rate rhythm Lungs: Decreased breath sounds to bases Abdomen: Soft, nontender, bowel sounds present Extremities: No edema Skin: No rashes on exposed skin Psychiatry: Affect normal // no auditory hallucinations   Data Reviewed: I have personally reviewed following labs and imaging studies  CBC: Recent Labs  Lab 05/25/20 0353 05/25/20 0957 05/26/20 0621 05/26/20 0621 05/27/20 0117 05/28/20 0116 05/29/20 0207 05/30/20 0119 05/31/20 0333  WBC 16.1*  --  12.4*   < > 11.4* 10.6* 10.5 8.8 7.5  NEUTROABS 12.5*  --  8.7*  --   --   --   --   --   --   HGB 8.4*   < > 7.2*   < > 7.5* 8.3* 8.0* 8.8* 8.2*  HCT 25.5*   < > 23.9*   < > 23.4* 25.4* 24.8* 27.0* 25.2*   MCV 92.4  --  98.8   < > 93.2 93.0 93.9 93.8 92.3  PLT 175  --  145*   < > 124* 131* 142* 148* 133*   < > = values in this interval not displayed.   Basic Metabolic Panel: Recent Labs  Lab 05/27/20 0117 05/28/20 0116 05/29/20 0207 05/30/20 0119 05/31/20 0333  NA 141 145 144 142 138  K 3.1* 3.7 3.8 3.8 3.8  CL 108 111 108 106 105  CO2 26 26 28 28 25   GLUCOSE 102* 110* 92 112* 115*  BUN 14 11 8  6* 7*  CREATININE 0.86 0.69 0.66 0.66 0.67  CALCIUM 7.8* 7.9* 8.0* 8.0* 7.4*  MG  --  1.6*  --   --   --    GFR: Estimated  Creatinine Clearance: 108.6 mL/min (by C-G formula based on SCr of 0.67 mg/dL). Liver Function Tests: Recent Labs  Lab 05/25/20 0353 05/26/20 0621  AST 36 31  ALT 37 27  ALKPHOS 88 80  BILITOT 0.9 1.0  PROT 5.4* 5.1*  ALBUMIN 1.9* 1.8*   No results for input(s): LIPASE, AMYLASE in the last 168 hours. No results for input(s): AMMONIA in the last 168 hours. Coagulation Profile: No results for input(s): INR, PROTIME in the last 168 hours. Cardiac Enzymes: No results for input(s): CKTOTAL, CKMB, CKMBINDEX, TROPONINI in the last 168 hours. BNP (last 3 results) No results for input(s): PROBNP in the last 8760 hours. HbA1C: No results for input(s): HGBA1C in the last 72 hours. CBG: Recent Labs  Lab 05/31/20 0025 05/31/20 0749 05/31/20 1229  GLUCAP 113* 102* 139*   Lipid Profile: No results for input(s): CHOL, HDL, LDLCALC, TRIG, CHOLHDL, LDLDIRECT in the last 72 hours. Thyroid Function Tests: No results for input(s): TSH, T4TOTAL, FREET4, T3FREE, THYROIDAB in the last 72 hours. Anemia Panel: No results for input(s): VITAMINB12, FOLATE, FERRITIN, TIBC, IRON, RETICCTPCT in the last 72 hours. Sepsis Labs: No results for input(s): PROCALCITON, LATICACIDVEN in the last 168 hours.  Recent Results (from the past 240 hour(s))  Urine culture     Status: None   Collection Time: 05/21/20  3:33 PM   Specimen: In/Out Cath Urine  Result Value Ref Range Status    Specimen Description IN/OUT CATH URINE  Final   Special Requests NONE  Final   Culture   Final    NO GROWTH Performed at [facility], [address], [place], [place] [address]    Report Status 05/22/2020 FINAL  Final  Resp Panel by RT-PCR (Flu A&B, Covid) Nasopharyngeal Swab     Status: None   Collection Time: 05/21/20  3:35 PM   Specimen: Nasopharyngeal Swab; Nasopharyngeal(NP) swabs in vial transport medium  Result Value Ref Range Status   SARS Coronavirus 2 by RT PCR NEGATIVE NEGATIVE Final    Comment: (NOTE) SARS-CoV-2 target nucleic acids are NOT DETECTED.  The SARS-CoV-2 RNA is generally detectable in upper respiratory specimens during the acute phase of infection. The lowest concentration of SARS-CoV-2 viral copies this assay can detect is 138 copies/mL. A negative result does not preclude SARS-Cov-2 infection and should not be used as the sole basis for treatment or other patient management decisions. A negative result may occur with  improper specimen collection/handling, submission of specimen other than nasopharyngeal swab, presence of viral mutation(s) within the areas targeted by this assay, and inadequate number of viral copies(<138 copies/mL). A negative result must be combined with clinical observations, patient history, and epidemiological information. The expected result is Negative.  Fact Sheet for Patients:  EntrepreneurPulse.com.au  Fact Sheet for Healthcare Providers:  IncredibleEmployment.be  This test is no t yet approved or cleared by the [place] FDA and  has been authorized for detection and/or diagnosis of SARS-CoV-2 by FDA under an Emergency Use Authorization (EUA). This EUA will remain  in effect (meaning this test can be used) for the duration of the COVID-19 declaration under Section 564(b)(1) of the Act, 21 U.S.C.section 360bbb-3(b)(1), unless the authorization is terminated  or revoked sooner.        Influenza A by PCR NEGATIVE NEGATIVE Final   Influenza B by PCR NEGATIVE NEGATIVE Final    Comment: (NOTE) The Xpert Xpress SARS-CoV-2/FLU/RSV plus assay is intended as an aid in the diagnosis of influenza from Nasopharyngeal swab  specimens and should not be used as a sole basis for treatment. Nasal washings and aspirates are unacceptable for Xpert Xpress SARS-CoV-2/FLU/RSV testing.  Fact Sheet for Patients: EntrepreneurPulse.com.au  Fact Sheet for Healthcare Providers: IncredibleEmployment.be  This test is not yet approved or cleared by the [place] FDA and has been authorized for detection and/or diagnosis of SARS-CoV-2 by FDA under an Emergency Use Authorization (EUA). This EUA will remain in effect (meaning this test can be used) for the duration of the COVID-19 declaration under Section 564(b)(1) of the Act, 21 U.S.C. section 360bbb-3(b)(1), unless the authorization is terminated or revoked.  Performed at [facility], [place] [address], [place], [place] [address]   Blood Culture (routine x 2)     Status: None   Collection Time: 05/21/20  4:40 PM   Specimen: BLOOD  Result Value Ref Range Status   Specimen Description BLOOD SITE NOT SPECIFIED  Final   Special Requests   Final    BOTTLES DRAWN AEROBIC ONLY Blood Culture results may not be optimal due to an inadequate volume of blood received in culture bottles   Culture   Final    NO GROWTH 5 DAYS Performed at [facility], [place] [address], [place], [place] [address]    Report Status 05/26/2020 FINAL  Final  Blood Culture (routine x 2)     Status: None   Collection Time: 05/21/20  4:42 PM   Specimen: BLOOD  Result Value Ref Range Status   Specimen Description BLOOD SITE NOT SPECIFIED  Final   Special Requests   Final    BOTTLES DRAWN AEROBIC ONLY Blood Culture results may not be optimal due to an inadequate volume of blood received in culture bottles   Culture    Final    NO GROWTH 5 DAYS Performed at [facility], [place] [address], [place], [place] [address]    Report Status 05/26/2020 FINAL  Final  Culture, blood (routine x 2)     Status: None (Preliminary result)   Collection Time: 05/30/20  4:29 PM   Specimen: BLOOD LEFT HAND  Result Value Ref Range Status   Specimen Description BLOOD LEFT HAND  Final   Special Requests   Final    BOTTLES DRAWN AEROBIC AND ANAEROBIC Blood Culture results may not be optimal due to an inadequate volume of blood received in culture bottles   Culture   Final    NO GROWTH < 24 HOURS Performed at [facility], [place] [address], [place], [place] [address]    Report Status PENDING  Incomplete  Culture, blood (routine x 2)     Status: None (Preliminary result)   Collection Time: 05/30/20  4:32 PM   Specimen: BLOOD LEFT ARM  Result Value Ref Range Status   Specimen Description BLOOD LEFT ARM  Final   Special Requests   Final    BOTTLES DRAWN AEROBIC ONLY Blood Culture results may not be optimal due to an inadequate volume of blood received in culture bottles   Culture   Final    NO GROWTH < 24 HOURS Performed at [facility], [place] [address] [place],  [address]    Report Status PENDING  Incomplete     Radiology Studies: DG Chest 2 View  Result Date: 05/31/2020 CLINICAL DATA:  Fever, hypertension, former smoker EXAM: CHEST - 2 VIEW COMPARISON:  05/30/2020 FINDINGS: Normal heart size, mediastinal contours, and pulmonary vascularity. RIGHT greater than LEFT basilar opacities question  pneumonia versus atelectasis little changed. Small bibasilar effusions larger on RIGHT. No pneumothorax. Degenerative changes of LEFT glenohumeral joint. IMPRESSION: Persistent bibasilar opacities question atelectasis versus pneumonia with small bibasilar effusions, both greater on RIGHT. Electronically Signed   By: Lavonia Dana M.D.   On: 05/31/2020 08:56   DG CHEST PORT 1 VIEW  Result Date: 05/30/2020 CLINICAL  DATA:  Fever chest pain EXAM: PORTABLE CHEST 1 VIEW COMPARISON:  05/21/2020, 05/08/2020, 05/06/2020 FINDINGS: Low lung volumes with elevation of right diaphragm. Similar right greater than left basilar consolidations. Possible small pleural effusions. Stable cardiomediastinal silhouette. No pneumothorax IMPRESSION: Similar appearance of right greater than left basilar consolidations, atelectasis versus pneumonia, with possible small pleural effusions. Electronically Signed   By: Donavan Foil M.D.   On: 05/30/2020 19:16    Scheduled Meds: . Chlorhexidine Gluconate Cloth  6 each Topical Daily  . collagenase   Topical Daily  . docusate sodium  100 mg Oral BID  . ferrous sulfate  325 mg Oral BID WC  . metoprolol tartrate  25 mg Oral BID  .  morphine injection  2 mg Intravenous Once  . oxybutynin  5 mg Oral QHS  . pantoprazole  40 mg Oral QHS  . tamsulosin  0.4 mg Oral Daily   Continuous Infusions: . sodium chloride irrigation       LOS: 10 days   Domenic Polite, MD [facility]  05/31/2020, 12:39 PM

## 2020-05-31 NOTE — Evaluation (Signed)
Clinical/Bedside Swallow Evaluation Patient Details  Name: Shannon [name] MRN: [id number] Date of Birth: 11/05/1945  Today's Date: 05/31/2020 Time: SLP Start Time (ACUTE ONLY): 1454 SLP Stop Time (ACUTE ONLY): 1505 SLP Time Calculation (min) (ACUTE ONLY): 11 min  Past Medical History:  Past Medical History:  Diagnosis Date  . Asthma   . 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 Newell Coral, 1992-->surg at [facility].  Summer 2021->Cervicalgia,arm pain, hand atrophy RUE wkness, hyperreflex (Dr. 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 Hay fever   . Hypertension   . Osteoarthritis of both hands    Past Surgical History:  Past Surgical History:  Procedure Laterality Date  . ANTERIOR CERVICAL DECOMP/DISCECTOMY FUSION N/A 04/05/2020   Procedure: ANTERIOR CERVICAL DECOMPRESSION/DISCECTOMY FUSION, INTERBODY PROSTHESIS, PLATE/SCREWS CERVICAL THREE-CERVICAL FOUR, CERVICAL FOUR- CERVICAL FIVE;  Surgeon: Tressie Stalker, MD;  Location: [facility] OR;  Service: Neurosurgery;  Laterality: N/A;  . ANTERIOR CERVICAL DECOMP/DISCECTOMY FUSION N/A 04/22/2020   Procedure: Reexploration of anterior cervical wound for epidural hematoma;  Surgeon: Donalee Citrin, MD;  Location: [facility] OR;  Service: Neurosurgery;  Laterality: N/A;  . BACK SURGERY    . INCISION AND DRAINAGE Left 2012   L hand infection  . ROTATOR CUFF REPAIR Right    x 3  . SPINE SURGERY     HPI:  Shannon Sanzo Smithis a 74 y.o.malewith medical history significant ofquadriplegia which follows surgery for Chiari I malformation with syringomyelia surgery in October 2021.  He originally had a C3-4 Belcher 4-5 anterior cervical disc ACDF with decompression, C3 3-4 and C4-5 interbody arthrodesis with local autograft bone, anterior cervical plating of C3-C5 on 04/05/2020.  He then went home and had a fall from his bed, he was then  hospitalized and from here he was sent to a nursing home, he has been quadriplegic since then, he has also developed sacral decubitus ulcers. Was seen for MBS on 05/04/20 "deficits resulted in silent aspiration of thin liquid and nectar thick liquid by straw prior to the swallow. With nectar thick liquid by cup, only transient penetration was seen. There was significant pharyngeal residue with puree and solid consistencies which was reduced but not fully cleared with liquid wash." Now admitted for AMS due to UTI, sepsis and hypotension in a patient with indwelling Foley catheter POA and sacral decubitus ulcers POA who is quadriplegic. Pt/niece preferred to remain on nectar thick full liquids- pt stable and ST signed off. Re-ordered by hospitalist "suspicion for aspiration pna".     Assessment / Plan / Recommendation Clinical Impression  This therapist is familiar with pt from previous admission and evaluated pt's swallowing earlier in this admission as well. Post ACDF pt discharged to SNF, exhibted s/s aspiration and initiated on full liquids thickened to nectar consistency. MBS 11/11 revealed aspiration with thin and nectar via straw. Nectar with cup decreased to penetration although aspiration risk remained. Pt and niece desired to remain on full liquids to nectar thick and ST signed off. Yesterday pt became febrile with questionable pna on chest xray with swallow eval reordered. Today, Mr. Keir swallowing appears similar to most recent therapy session from subjective perspective. He only wanted to take several sips nectar liquid and one bite pudding however no outward s/s aspiration. Since ACD,F, risk is increased and he is deconditioned. Pt/family desires full code following Palliative care meeting. SLP will pick pt up for therapy  for further education with niece regarding her uncles swallow status.   SLP Visit Diagnosis: Dysphagia, oropharyngeal phase (R13.12)    Aspiration Risk  Severe aspiration  risk;Risk for inadequate nutrition/hydration;Moderate aspiration risk    Diet Recommendation Nectar-thick liquid;Other (Comment) (full liquids-nectar thick liquids)   Liquid Administration via: Cup Medication Administration: Crushed with puree Supervision: Staff to assist with self feeding;Full supervision/cueing for compensatory strategies Compensations: Slow rate;Small sips/bites;Follow solids with liquid Postural Changes: Seated upright at 90 degrees    Other  Recommendations Oral Care Recommendations: Oral care BID   Follow up Recommendations Skilled Nursing facility      Frequency and Duration min 1 x/week  1 week       Prognosis Prognosis for Safe Diet Advancement: Fair Barriers to Reach Goals: Cognitive deficits;Severity of deficits      Swallow Study   General HPI: Shannon Koska Smithis a 74 y.o.malewith medical history significant ofquadriplegia which follows surgery for Chiari I malformation with syringomyelia surgery in October 2021.  He originally had a C3-4 Rowe 4-5 anterior cervical disc ACDF with decompression, C3 3-4 and C4-5 interbody arthrodesis with local autograft bone, anterior cervical plating of C3-C5 on 04/05/2020.  He then went home and had a fall from his bed, he was then hospitalized and from here he was sent to a nursing home, he has been quadriplegic since then, he has also developed sacral decubitus ulcers. Was seen for MBS on 05/04/20 "deficits resulted in silent aspiration of thin liquid and nectar thick liquid by straw prior to the swallow. With nectar thick liquid by cup, only transient penetration was seen. There was significant pharyngeal residue with puree and solid consistencies which was reduced but not fully cleared with liquid wash." Now admitted for AMS due to UTI, sepsis and hypotension in a patient with indwelling Foley catheter POA and sacral decubitus ulcers POA who is quadriplegic. Pt/niece preferred to remain on nectar thick full liquids- pt  stable and ST signed off. Re-ordered by hospitalist "suspicion for aspiration pna".   Type of Study: Bedside Swallow Evaluation Previous Swallow Assessment:  (see HPI) Diet Prior to this Study: Nectar-thick liquids;Other (Comment) (full liquids nectar thick ) Temperature Spikes Noted: No Respiratory Status: Nasal cannula History of Recent Intubation: No Behavior/Cognition: Alert;Requires cueing;Cooperative Oral Cavity Assessment: Within Functional Limits Oral Care Completed by SLP: No Oral Cavity - Dentition: Adequate natural dentition Vision:  (kept eyes closed) Self-Feeding Abilities: Total assist Patient Positioning: Upright in bed Baseline Vocal Quality: Normal Volitional Cough: Weak    Oral/Motor/Sensory Function Overall Oral Motor/Sensory Function: Generalized oral weakness   Ice Chips Ice chips: Not tested   Thin Liquid Thin Liquid: Not tested (on nectar thick at baseline)    Nectar Thick Nectar Thick Liquid: Within functional limits Presentation: Cup;Straw   Honey Thick Honey Thick Liquid: Not tested   Puree Puree: Within functional limits   Solid     Solid: Not tested      Royce Macadamia 05/31/2020,4:24 PM  Breck Coons Lonell Face.Ed Nurse, children's [phone] Office [phone]

## 2020-05-31 NOTE — Plan of Care (Signed)
Patient remains free from falls. Safety precautions maintained. 

## 2020-05-31 NOTE — Progress Notes (Signed)
This chaplain responded to PMT referral for Pt. spiritual care.  The Pt. is awake and welcomes the chaplain's presence.  The chaplain listen to the Pt. share his desire for strong relationships, both personal and medical, with his trust in God.  The Pt. shared many examples of trust from scripture and his own experiences with the chaplain.  The chaplain understands prayer as modeled by his mother is a source of strength for the Pt.  The Pt. accepted the chaplain's invitation for prayer and F/U spiritual care.

## 2020-06-01 LAB — BASIC METABOLIC PANEL
Anion gap: 8 (ref 5–15)
BUN: 11 mg/dL (ref 8–23)
CO2: 26 mmol/L (ref 22–32)
Calcium: 7.2 mg/dL — ABNORMAL LOW (ref 8.9–10.3)
Chloride: 104 mmol/L (ref 98–111)
Creatinine, Ser: 0.77 mg/dL (ref 0.61–1.24)
GFR, Estimated: >60 mL/min (ref >60)
Glucose, Bld: 107 mg/dL — ABNORMAL HIGH (ref 70–99)
Potassium: 3.9 mmol/L (ref 3.5–5.1)
Sodium: 138 mmol/L (ref 135–145)

## 2020-06-01 LAB — CBC
HCT: 24.1 % — ABNORMAL LOW (ref 39.0–52.0)
Hemoglobin: 8 g/dL — ABNORMAL LOW (ref 13.0–17.0)
MCH: 30.8 pg (ref 26.0–34.0)
MCHC: 33.2 g/dL (ref 30.0–36.0)
MCV: 92.7 fL (ref 80.0–100.0)
Platelets: 122 10*3/uL — ABNORMAL LOW (ref 150–400)
RBC: 2.6 MIL/uL — ABNORMAL LOW (ref 4.22–5.81)
RDW: 15.4 % (ref 11.5–15.5)
WBC: 7.5 10*3/uL (ref 4.0–10.5)
nRBC: 0 % (ref 0.0–0.2)

## 2020-06-01 LAB — LACTIC ACID, PLASMA: Lactic Acid, Venous: 1.4 mmol/L (ref 0.5–1.9)

## 2020-06-01 MED ORDER — SODIUM CHLORIDE 0.9 % IV SOLN: INTRAVENOUS | Status: DC

## 2020-06-01 MED ORDER — ACETAMINOPHEN 10 MG/ML IV SOLN
INTRAVENOUS | Status: AC
  Filled 2020-06-01: qty 100

## 2020-06-01 MED ORDER — MIDODRINE HCL 5 MG PO TABS
10.0000 mg | ORAL_TABLET | Freq: Two times a day (BID) | ORAL | Status: DC
  Administered 2020-06-01 – 2020-06-12 (×23): 10 mg via ORAL
  Filled 2020-06-01 (×22): qty 2

## 2020-06-01 MED ORDER — PHENYLEPHRINE HCL-NACL 10-0.9 MG/250ML-% IV SOLN
INTRAVENOUS | Status: AC
  Filled 2020-06-01: qty 750

## 2020-06-01 MED ORDER — SODIUM CHLORIDE 0.9 % IV BOLUS
500.0000 mL | Freq: Once | INTRAVENOUS | Status: AC
  Administered 2020-06-01: 500 mL via INTRAVENOUS

## 2020-06-01 NOTE — Progress Notes (Addendum)
PROGRESS NOTE    Shannon [name]  UJW:[id number] DOB: 09/08/1945 DOA: 05/21/2020 PCP: Jeoffrey MassedMcGowen, Philip H, MD   Brief Narrative:  74 y.o.malewith medical history significant ofquadriplegia, Chiari I malformation with syringomyelia, ACDF in October 2021, residual quadriplegia, then had a fall at home developed epidural hematoma, underwent reexploration, removal of hardware, evacuation of hematoma on 10/30, now quadriplegic with dysphagia, unstageable sacral decubitus wounds, failure to thrive, recurrent hospitalizations -Subsequently hospitalized with aspiration pneumonia, then went to SNF -Readmitted 11/28 with septic shock, severe frank hematuria and UTI -Treated with antibiotics, started continuous bladder irrigation -Also seen by palliative care this admission -12/7: Developed worsening cough, fevers and tachycardia, started antibiotics for aspiration pneumonia 12/8-9: Overnight with hypotension  Assessment & Plan:    Septic shock  Acute metabolic encephalopathy due to UTI, sepsis and hypotension -Blood pressure improved initially, completed 10-day course of ceftriaxone yesterday -Overnight with hypotension again, continue IV fluids with boluses, antibiotics changed to treat aspiration pneumonia -Midodrine added  Frank hematuria -In the setting of indwelling Foley catheter/urethral injury and UTI -Seen by urology for persistent frank hematuria -Started CBI -Hematuria appears to be improving, wean down CBI -Will need Foley catheter at discharge, urology following as needed -Completed 10-day course of ceftriaxone 12/8  Aspiration pneumonia Severe dysphagia -Fever and tachycardia on 12/7, reports slight worsening cough -Chest x-ray with persistent lower lobe opacities -Start antibiotics for aspiration pneumonia, SLP following -Continues to be high risk of aspiration -Remains on thickened liquids  Cervical myelopathy, progressive quadriparesis  Severe dysphagia -Had  C-spine surgery on 10/13/ACDF -Subsequently had a fall at home developed epidural hematoma, underwent reexploration, removal of hardware, evacuation of hematoma on 10/30 -Now quadriplegic with dysphagia, unstageable sacral decubitus wounds, failure to thrive, recurrent hospitalizations -Palliative care consulted for goals of care, patient family wishes for full scope of treatment -Continues to be at high risk of ongoing decline  Chronic sacral decubitus ulcers POA Large sacral decubitus wound which is unstageable. -General surgery was consulted., no debridement needed, getting hydrotherapy  Severe protein calorie malnutrition -With  cachexia weight loss -Supplements encouraged  Essential hypertension/hypokalemia Blood pressure medications were on hold due to low BP.  Blood pressure remains relatively stable.   Anemia of chronic disease with some evidence of low B12 and borderline iron deficiency/acute blood loss anemia due to hematuria Continue B12 supplementation and iron.   -Transfused 2 units of PRBC this admission -Monitor, transfuse for hemoglobin less than 7  Chronic Eliquis use This was started for DVT prophylaxis at the rehab.  Has been discontinued.  Pt was later placed on heparin which was stopped due to hematuria.   Palliative care encounter -Overall poor prognosis, status post palliative care meeting, plan for full code full scope of treatment per daughter  DVT prophylaxis: SCD's Code Status: Full Family Communication: No family at bedside, updated niece yesterday, left message for daughter [name]  Status is: Inpatient  Remains inpatient appropriate because:Unsafe d/c plan and Inpatient level of care appropriate due to severity of illness, persistent hematuria and now aspiration pneumonia   Dispo: The patient is from: SNF              Anticipated d/c is to: SNF              Anticipated d/c date is: 3-4days if stabilizes              Patient currently is not  medically stable to d/c.   Consultants:   Urology  Procedures:  Antimicrobials: Anti-infectives (From admission, onward)   Start     Dose/Rate Route Frequency Ordered Stop   05/31/20 1400  ceFEPIme (MAXIPIME) 2 g in sodium chloride 0.9 % 100 mL IVPB        2 g 200 mL/hr over 30 Minutes Intravenous Every 8 hours 05/31/20 1240     05/24/20 1415  cefTRIAXone (ROCEPHIN) 1 g in sodium chloride 0.9 % 100 mL IVPB        1 g 200 mL/hr over 30 Minutes Intravenous Every 24 hours 05/24/20 1330 05/31/20 1110   05/23/20 1400  ceFEPIme (MAXIPIME) 2 g in sodium chloride 0.9 % 100 mL IVPB  Status:  Discontinued        2 g 200 mL/hr over 30 Minutes Intravenous Every 8 hours 05/23/20 1011 05/24/20 1330   05/22/20 0500  ceFEPIme (MAXIPIME) 2 g in sodium chloride 0.9 % 100 mL IVPB  Status:  Discontinued        2 g 200 mL/hr over 30 Minutes Intravenous Every 12 hours 05/21/20 1614 05/23/20 1011   05/21/20 2015  aztreonam (AZACTAM) 2 g in sodium chloride 0.9 % 100 mL IVPB        2 g 200 mL/hr over 30 Minutes Intravenous  Once 05/21/20 2010 05/21/20 2230   05/21/20 1730  vancomycin (VANCOREADY) IVPB 1750 mg/350 mL        1,750 mg 175 mL/hr over 120 Minutes Intravenous  Once 05/21/20 1722 05/21/20 2148   05/21/20 1545  ceFEPIme (MAXIPIME) 2 g in sodium chloride 0.9 % 100 mL IVPB        2 g 200 mL/hr over 30 Minutes Intravenous  Once 05/21/20 1535 05/21/20 1704      Subjective: Without complaints this AM  Objective: Vitals:   05/31/20 2100 06/01/20 0100 06/01/20 0500 06/01/20 0811  BP: (!) 162/91  (!) 80/51 (!) 81/52  Pulse:  97 93 94  Resp:  18 17 20   Temp: 98.1 F (36.7 C)  97.9 F (36.6 C) (!) 100.4 F (38 C)  TempSrc: Oral  Oral   SpO2:  97% 97% 100%  Weight:      Height:        Intake/Output Summary (Last 24 hours) at 06/01/2020 1320 Last data filed at 06/01/2020 14/02/2020 Gross per 24 hour  Intake --  Output 3000 ml  Net -3000 ml   Filed Weights   05/21/20 1604 05/22/20 0045  05/22/20 1115  Weight: 84.7 kg 120.7 kg 120.7 kg    Examination: General exam: Chronically ill elderly male laying in bed, awake alert oriented to self and place HEENT: Hard cervical collar noted CVS: S1-S2, regular rate rhythm Lungs: Decreased breath sounds to bases Abdomen: Soft, nontender, bowel sounds present Extremities: No edema Skin: No rashes on exposed skin Psychiatry: Affect normal // no auditory hallucinations   Data Reviewed: I have personally reviewed following labs and imaging studies  CBC: Recent Labs  Lab 05/26/20 0621 05/27/20 0117 05/28/20 0116 05/29/20 0207 05/30/20 0119 05/31/20 0333 06/01/20 0234  WBC 12.4*   < > 10.6* 10.5 8.8 7.5 7.5  NEUTROABS 8.7*  --   --   --   --   --   --   HGB 7.2*   < > 8.3* 8.0* 8.8* 8.2* 8.0*  HCT 23.9*   < > 25.4* 24.8* 27.0* 25.2* 24.1*  MCV 98.8   < > 93.0 93.9 93.8 92.3 92.7  PLT 145*   < > 131* 142* 148* 133* 122*   < > =  values in this interval not displayed.   Basic Metabolic Panel: Recent Labs  Lab 05/28/20 0116 05/29/20 0207 05/30/20 0119 05/31/20 0333 06/01/20 0234  NA 145 144 142 138 138  K 3.7 3.8 3.8 3.8 3.9  CL 111 108 106 105 104  CO2 26 28 28 25 26   GLUCOSE 110* 92 112* 115* 107*  BUN 11 8 6* 7* 11  CREATININE 0.69 0.66 0.66 0.67 0.77  CALCIUM 7.9* 8.0* 8.0* 7.4* 7.2*  MG 1.6*  --   --   --   --    GFR: Estimated Creatinine Clearance: 108.6 mL/min (by C-G formula based on SCr of 0.77 mg/dL). Liver Function Tests: Recent Labs  Lab 05/26/20 0621  AST 31  ALT 27  ALKPHOS 80  BILITOT 1.0  PROT 5.1*  ALBUMIN 1.8*   No results for input(s): LIPASE, AMYLASE in the last 168 hours. No results for input(s): AMMONIA in the last 168 hours. Coagulation Profile: No results for input(s): INR, PROTIME in the last 168 hours. Cardiac Enzymes: No results for input(s): CKTOTAL, CKMB, CKMBINDEX, TROPONINI in the last 168 hours. BNP (last 3 results) No results for input(s): PROBNP in the last 8760  hours. HbA1C: No results for input(s): HGBA1C in the last 72 hours. CBG: Recent Labs  Lab 05/31/20 0025 05/31/20 0749 05/31/20 1229  GLUCAP 113* 102* 139*   Lipid Profile: No results for input(s): CHOL, HDL, LDLCALC, TRIG, CHOLHDL, LDLDIRECT in the last 72 hours. Thyroid Function Tests: No results for input(s): TSH, T4TOTAL, FREET4, T3FREE, THYROIDAB in the last 72 hours. Anemia Panel: No results for input(s): VITAMINB12, FOLATE, FERRITIN, TIBC, IRON, RETICCTPCT in the last 72 hours. Sepsis Labs: No results for input(s): PROCALCITON, LATICACIDVEN in the last 168 hours.  Recent Results (from the past 240 hour(s))  Culture, blood (routine x 2)     Status: None (Preliminary result)   Collection Time: 05/30/20  4:29 PM   Specimen: BLOOD LEFT HAND  Result Value Ref Range Status   Specimen Description BLOOD LEFT HAND  Final   Special Requests   Final    BOTTLES DRAWN AEROBIC AND ANAEROBIC Blood Culture results may not be optimal due to an inadequate volume of blood received in culture bottles   Culture   Final    NO GROWTH 2 DAYS Performed at [facility], [address], [place], [place]    Report Status PENDING  Incomplete  Culture, blood (routine x 2)     Status: None (Preliminary result)   Collection Time: 05/30/20  4:32 PM   Specimen: BLOOD LEFT ARM  Result Value Ref Range Status   Specimen Description BLOOD LEFT ARM  Final   Special Requests   Final    BOTTLES DRAWN AEROBIC ONLY Blood Culture results may not be optimal due to an inadequate volume of blood received in culture bottles   Culture   Final    NO GROWTH 2 DAYS Performed at [facility], [address], [place], [place]    Report Status PENDING  Incomplete  Culture, Urine     Status: Abnormal (Preliminary result)   Collection Time: 05/30/20  6:03 PM   Specimen: Urine, Random  Result Value Ref Range Status   Specimen Description URINE, RANDOM  Final   Special Requests NONE   Final   Culture (A)  Final    20,000 COLONIES/mL GRAM NEGATIVE RODS IDENTIFICATION AND SUSCEPTIBILITIES TO FOLLOW Performed at [facility], [address], [place], [place] [address]    Report Status PENDING  Incomplete     Radiology Studies: DG Chest 2 View  Result Date: 05/31/2020 CLINICAL DATA:  Fever, hypertension, former smoker EXAM: CHEST - 2 VIEW COMPARISON:  05/30/2020 FINDINGS: Normal heart size, mediastinal contours, and pulmonary vascularity. RIGHT greater than LEFT basilar opacities question pneumonia versus atelectasis little changed. Small bibasilar effusions larger on RIGHT. No pneumothorax. Degenerative changes of LEFT glenohumeral joint. IMPRESSION: Persistent bibasilar opacities question atelectasis versus pneumonia with small bibasilar effusions, both greater on RIGHT. Electronically Signed   By: Ulyses Southward M.D.   On: 05/31/2020 08:56   DG CHEST PORT 1 VIEW  Result Date: 05/30/2020 CLINICAL DATA:  Fever chest pain EXAM: PORTABLE CHEST 1 VIEW COMPARISON:  05/21/2020, 05/08/2020, 05/06/2020 FINDINGS: Low lung volumes with elevation of right diaphragm. Similar right greater than left basilar consolidations. Possible small pleural effusions. Stable cardiomediastinal silhouette. No pneumothorax IMPRESSION: Similar appearance of right greater than left basilar consolidations, atelectasis versus pneumonia, with possible small pleural effusions. Electronically Signed   By: Jasmine Pang M.D.   On: 05/30/2020 19:16    Scheduled Meds: . Chlorhexidine Gluconate Cloth  6 each Topical Daily  . collagenase   Topical Daily  . docusate sodium  100 mg Oral BID  . ferrous sulfate  325 mg Oral BID WC  . midodrine  10 mg Oral BID WC  . oxybutynin  5 mg Oral QHS  . pantoprazole  40 mg Oral QHS  . tamsulosin  0.4 mg Oral Daily   Continuous Infusions: . sodium chloride    . ceFEPime (MAXIPIME) IV 2 g (06/01/20 0539)  . sodium chloride irrigation       LOS: 11 days   Zannie Cove, MD [facility]  06/01/2020, 1:20 PM

## 2020-06-01 NOTE — Progress Notes (Signed)
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 Presenting this admission from SNF with UTI, dehydration and hypotension. Also has several sacral decubitus ulcers.     PT Comments    Pt very limited by pain with PROM to 4 extremities.  Attempted to find what would make him more comfortable positioning wise and pt reports that noting is comfortable.  Re-applied Prevalon boots for pressure relief.  Nephew in room throughout session.  PT will continue to follow acutely for safe mobility progression.   Follow Up Recommendations  SNF     Equipment Recommendations  Hospital bed;Other (comment) (hoyer lift, air mattress)    Recommendations for Other Services       Precautions / Restrictions Precautions Precautions: Fall;Cervical Precaution Booklet Issued: No Precaution Comments: pt not able to recieve instructions at this time  Required Braces or Orthoses: Cervical Brace Cervical Brace: Hard collar;At all times Restrictions Other Position/Activity Restrictions: collar too short and pt moving head freely on arrival with collar adjusted    Mobility  Bed Mobility Overal bed mobility: Needs Assistance Bed Mobility: Rolling Rolling: Max assist Sidelying to sit: Total assist;+2 for physical assistance;+2 for safety/equipment   Sit to supine: Total assist;+2 for safety/equipment;+2 for physical assistance   General bed mobility comments: MAX A to reposition in bed  Transfers                 General transfer comment: defer d/t pain and lack of +2 assist  Ambulation/Gait             General Gait Details: unable to attempt   Stairs             Wheelchair Mobility    Modified Rankin (Stroke Patients Only)        Balance Overall balance assessment: Needs assistance Sitting-balance support: Feet supported;No upper extremity supported Sitting balance-Leahy Scale: Zero Sitting balance - Comments: not attempted       Standing balance comment: unable to attempt this day                            Cognition Arousal/Alertness: Awake/alert Behavior During Therapy: WFL for tasks assessed/performed Overall Cognitive Status: No family/caregiver present to determine baseline cognitive functioning Area of Impairment: Attention;Following commands;Safety/judgement;Awareness                 Orientation Level: Disoriented to;Time Current Attention Level: Sustained Memory: Decreased short-term memory Following Commands: Follows one step commands consistently Safety/Judgement: Decreased awareness of safety;Decreased awareness of deficits Awareness: Emergent Problem Solving: Slow processing;Decreased initiation;Difficulty sequencing;Requires verbal cues General Comments: I remembering him having some impairments with his previous admission, so this may be baseline?  He also tends to get upset with too many cognitive/orientation questions.      Exercises General Exercises - Upper Extremity Shoulder Flexion: PROM;Both;10 reps (to 90) Shoulder ABduction: PROM;Both;10 reps Shoulder Horizontal ADduction: AAROM;Strengthening;Both;10 reps;Supine Elbow Flexion: PROM;Both;10 reps Elbow Extension: AROM;Both;5 reps;Supine Wrist Flexion: PROM;Both;5 reps;Supine Wrist Extension: PROM;Both;10 reps Digit Composite Flexion: PROM;Both;10 reps General Exercises - Lower Extremity Ankle Circles/Pumps: PROM;Both;10 reps Short Arc Quad: AAROM;Strengthening;Both;15 reps;Supine Long Arc Quad: PROM;Both;Seated;10 reps Heel Slides: PROM;Both;10 reps Hip ABduction/ADduction: PROM;Both;10 reps Heel Raises: PROM;Both;10 reps;Supine (pt had slight muscle activation on  the L with heel slides) Hand  Exercises Forearm Supination: PROM;Both;10 reps Forearm Pronation: PROM;Both;10 reps Other Exercises Other Exercises: Retro massage to bil hands with lotion for skin cracking. Other Exercises: Prevalons re appiled to bil feet at end of session, heels checked without signs of pressure    General Comments        Pertinent Vitals/Pain Pain Assessment: Faces Faces Pain Scale: Hurts even more Pain Location: extremities with PROM Pain Descriptors / Indicators: Grimacing;Guarding Pain Intervention(s): Limited activity within patient's tolerance;Monitored during session;Repositioned    Home Living Family/patient expects to be discharged to:: Skilled nursing facility Living Arrangements: Alone;Children Available Help at Discharge: Family;Available 24 hours/day Type of Home: House Home Access: Stairs to enter Entrance Stairs-Rails: Right Home Layout: One level Home Equipment: Walker - 4 wheels Additional Comments: Pt has been at [facility] for rehab since last admission. Prior to that he was d/c from CIR and home a short period of time with falls    Prior Function Level of Independence: Needs assistance  Gait / Transfers Assistance Needed: assist from staff at SNF, pt mentioned a power wheelchair but unable to confirm ADL's / Homemaking Assistance Needed: assist from staff at SNF, not able to feed himself  Comments: pt came from a SNF. pt confused on timeline of when he last ambulated. Pt states daugther might be able to live with him   PT Goals (current goals can now be found in the care plan section) Acute Rehab PT Goals Patient Stated Goal: To walk again PT Goal Formulation: With patient Time For Goal Achievement: 06/12/20 Potential to Achieve Goals: Fair Progress towards PT goals: Not progressing toward goals - comment (limited by pain)    Frequency    Min 2X/week      PT Plan Current plan remains appropriate    Co-evaluation PT/OT/SLP Co-Evaluation/Treatment: Yes             AM-PAC PT "6 Clicks" Mobility   Outcome Measure  Help needed turning from your back to your side while in a flat bed without using bedrails?: Total Help needed moving from lying on your back to sitting on the side of a flat bed without using bedrails?: Total Help needed moving to and from a bed to a chair (including a wheelchair)?: Total Help needed standing up from a chair using your arms (e.g., wheelchair or bedside chair)?: Total Help needed to walk in hospital room?: Total Help needed climbing 3-5 steps with a railing? : Total 6 Click Score: 6    End of Session Equipment Utilized During Treatment: Oxygen (2L o2 [place]) Activity Tolerance: Patient limited by pain Patient left: in bed;with call bell/phone within reach;with family/visitor present Nurse Communication: Other (comment) (uncovered wound under c collar) PT Visit Diagnosis: Muscle weakness (generalized) (M62.81);Difficulty in walking, not elsewhere classified (R26.2);Other symptoms and signs involving the nervous system (R29.898);Other (comment)     Time: 7846-9629 PT Time Calculation (min) (ACUTE ONLY): 33 min  Charges:  $Therapeutic Exercise: 23-37 mins                     Corinna Capra, PT, DPT  Acute Rehabilitation [phone] pager [phone] office

## 2020-06-01 NOTE — Progress Notes (Addendum)
Physical Therapy Wound Treatment Patient Details  Name: Shannon [name] MRN: [id number] Date of Birth: 1945/08/20  Today's Date: 06/01/2020 Time: 5277-8242 Time Calculation (min): 45 min  Subjective  Subjective: Pt pleasant and agreeable to hydro. Alert and talkative today.  Patient and Family Stated Goals: Heal wound Prior Treatments: Dressing changes  Pain Score:  Pt very painful this session, and RN unable to give pain meds due to hypotension.   Wound Assessment  Pressure Injury 05/23/20 Sacrum Unstageable - Full thickness tissue loss in which the base of the injury is covered by slough (yellow, tan, gray, green or brown) and/or eschar (tan, brown or black) in the wound bed. (Active)  Dressing Type ABD;Barrier Film (skin prep);Gauze (Comment);Moist to dry 06/01/20 1400  Dressing Changed;Clean;Dry;Intact 06/01/20 1400  Dressing Change Frequency Daily 06/01/20 1400  State of Healing Early/partial granulation 06/01/20 1400  Site / Wound Assessment Red;Brown;Yellow 06/01/20 1400  % Wound base Red or Granulating 40% 06/01/20 1400  % Wound base Yellow/Fibrinous Exudate 50% 06/01/20 1400  % Wound base Black/Eschar 10% 06/01/20 1400  % Wound base Other/Granulation Tissue (Comment) 0% 06/01/20 1400  Peri-wound Assessment Intact;Pink 06/01/20 1400  Wound Length (cm) 11 cm 05/30/20 1206  Wound Width (cm) 9.5 cm 05/30/20 1206  Wound Depth (cm) 3.2 cm 05/30/20 1206  Wound Surface Area (cm^2) 104.5 cm^2 05/30/20 1206  Wound Volume (cm^3) 334.4 cm^3 05/30/20 1206  Margins Unattached edges (unapproximated) 06/01/20 1400  Drainage Amount Minimal 06/01/20 1400  Drainage Description Serosanguineous 06/01/20 1400  Treatment Debridement (Selective);Hydrotherapy (Pulse lavage);Packing (Saline gauze) 06/01/20 1400  Santyl applied to wound bed prior to applying dressing.   Hydrotherapy Pulsed lavage therapy - wound location: Sacrum Pulsed Lavage with Suction (psi): 8 psi (8-12) Pulsed Lavage with  Suction - Normal Saline Used: 500 mL Pulsed Lavage Tip: Tip with splash shield Selective Debridement Selective Debridement - Location: Sacrum Selective Debridement - Tools Used: Forceps;Scissors Selective Debridement - Tissue Removed: yellow slough, brown unviable tissue   Wound Assessment and Plan  Wound Therapy - Assess/Plan/Recommendations Wound Therapy - Clinical Statement: Session limited due to pain and debridement deferred. Discussed placing the Cleanse Choice Vera Flow Wound VAC tomorrow with Dr. Broadus John. This would supplement our hydrotherapy sessions and allow [place] to decrease our frequency for pt comfort. Will attempt to place tomorrow. Wound Therapy - Functional Problem List: Decreased mobility and global weakness Factors Delaying/Impairing Wound Healing: Immobility Hydrotherapy Plan: Debridement;Dressing change;Patient/family education;Pulsatile lavage with suction Wound Therapy - Frequency: 6X / week Wound Therapy - Follow Up Recommendations: Skilled nursing facility Wound Plan: See above  Wound Therapy Goals- Improve the function of patient's integumentary system by progressing the wound(s) through the phases of wound healing (inflammation - proliferation - remodeling) by: Decrease Necrotic Tissue to: 20% Decrease Necrotic Tissue - Progress: Progressing toward goal Increase Granulation Tissue to: 80% Increase Granulation Tissue - Progress: Progressing toward goal Goals/treatment plan/discharge plan were made with and agreed upon by patient/family: Yes Time For Goal Achievement: 7 days Wound Therapy - Potential for Goals: Fair  Goals will be updated until maximal potential achieved or discharge criteria met.  Discharge criteria: when goals achieved, discharge from hospital, MD decision/surgical intervention, no progress towards goals, refusal/missing three consecutive treatments without notification or medical reason.  GP     Thelma Comp 06/01/2020, 3:14 PM   Rolinda Roan, PT, DPT Acute Rehabilitation Services Pager: [phone] Office: [phone]

## 2020-06-01 NOTE — Plan of Care (Signed)
Patient denies Pain or discomfort at this time.NS bolus and IVF running as ordered for low B/P.Dressing changed to sacrum per wound care. Patient remains free from falls, Safety precautions maintained.

## 2020-06-02 LAB — URINE CULTURE: Culture: 20000 — AB

## 2020-06-02 LAB — COMPREHENSIVE METABOLIC PANEL
ALT: 34 U/L (ref 0–44)
AST: 63 U/L — ABNORMAL HIGH (ref 15–41)
Albumin: 1.4 g/dL — ABNORMAL LOW (ref 3.5–5.0)
Alkaline Phosphatase: 75 U/L (ref 38–126)
Anion gap: 7 (ref 5–15)
BUN: 10 mg/dL (ref 8–23)
CO2: 26 mmol/L (ref 22–32)
Calcium: 7 mg/dL — ABNORMAL LOW (ref 8.9–10.3)
Chloride: 105 mmol/L (ref 98–111)
Creatinine, Ser: 0.67 mg/dL (ref 0.61–1.24)
GFR, Estimated: >60 mL/min (ref >60)
Glucose, Bld: 110 mg/dL — ABNORMAL HIGH (ref 70–99)
Potassium: 3.6 mmol/L (ref 3.5–5.1)
Sodium: 138 mmol/L (ref 135–145)
Total Bilirubin: 0.5 mg/dL (ref 0.3–1.2)
Total Protein: 4.4 g/dL — ABNORMAL LOW (ref 6.5–8.1)

## 2020-06-02 LAB — CBC
HCT: 24.8 % — ABNORMAL LOW (ref 39.0–52.0)
Hemoglobin: 7.8 g/dL — ABNORMAL LOW (ref 13.0–17.0)
MCH: 29.2 pg (ref 26.0–34.0)
MCHC: 31.5 g/dL (ref 30.0–36.0)
MCV: 92.9 fL (ref 80.0–100.0)
Platelets: 109 10*3/uL — ABNORMAL LOW (ref 150–400)
RBC: 2.67 MIL/uL — ABNORMAL LOW (ref 4.22–5.81)
RDW: 14.9 % (ref 11.5–15.5)
WBC: 7.2 10*3/uL (ref 4.0–10.5)
nRBC: 0 % (ref 0.0–0.2)

## 2020-06-02 MED ORDER — SODIUM CHLORIDE 0.9 % IV SOLN
510.0000 mg | INTRAVENOUS | Status: AC
  Administered 2020-06-02 – 2020-06-09 (×2): 510 mg via INTRAVENOUS
  Filled 2020-06-02 (×3): qty 17

## 2020-06-02 MED ORDER — OXYCODONE HCL 5 MG PO TABS
5.0000 mg | ORAL_TABLET | Freq: Two times a day (BID) | ORAL | Status: DC | PRN
  Administered 2020-06-02 – 2020-06-11 (×10): 5 mg via ORAL
  Filled 2020-06-02 (×10): qty 1

## 2020-06-02 MED ORDER — HYDROMORPHONE HCL 1 MG/ML IJ SOLN
0.5000 mg | INTRAMUSCULAR | Status: AC
  Administered 2020-06-02: 0.5 mg via INTRAVENOUS
  Filled 2020-06-02: qty 1

## 2020-06-02 MED ORDER — ENSURE ENLIVE PO LIQD
237.0000 mL | Freq: Three times a day (TID) | ORAL | Status: DC
  Administered 2020-06-02 – 2020-06-08 (×15): 237 mL via ORAL

## 2020-06-02 NOTE — Progress Notes (Signed)
PROGRESS NOTE    Shannon [name]  ONG:[id number] DOB: 06-18-1946 DOA: 05/21/2020 PCP: [name], MD   Brief Narrative:  74 y.o.malewith medical history significant ofquadriplegia, Chiari I malformation with syringomyelia, ACDF in October 2021, residual quadriplegia, then had a fall at home developed epidural hematoma, underwent reexploration, removal of hardware, evacuation of hematoma on 10/30, now quadriplegic with dysphagia, unstageable sacral decubitus wounds, failure to thrive, recurrent hospitalizations -Subsequently hospitalized with aspiration pneumonia, then went to SNF -Readmitted 11/28 with septic shock, severe frank hematuria and UTI -Treated with antibiotics, started continuous bladder irrigation -Also seen by palliative care this admission patient and daughter want full scope of treatment -12/7: Developed worsening cough, fevers and tachycardia, started antibiotics for aspiration pneumonia 12/8-9: Overnight with hypotension  Assessment & Plan:    Septic shock  Acute metabolic encephalopathy due to UTI, sepsis and hypotension -Blood pressure improved initially, completed 10-day course of ceftriaxone 12/8 -12/8-9 Overnight with hypotension again, antibiotics changed to treat aspiration pneumonia, blood pressure stabilizing, cut down IV fluids -Midodrine added  Frank hematuria -In the setting of indwelling Foley catheter/urethral injury and UTI -Seen by urology for persistent frank hematuria -Required CBI last week and part of this weeks, now discontinued -Hematuria has resolved -Will need Foley catheter at discharge, urology following as needed -Completed 10-day course of ceftriaxone 12/8  Aspiration pneumonia Severe dysphagia -Fever and tachycardia on 12/7, reports slight worsening cough -Chest x-ray with persistent lower lobe opacities -Start antibiotics for aspiration pneumonia, SLP following -Continues to be high risk of aspiration -Remains on  thickened liquids -Plan for repeat MBS noted  Cervical myelopathy, Quadriplegia Severe dysphagia -Had C-spine surgery on 10/13/ACDF -Subsequently had a fall at home developed epidural hematoma, underwent reexploration, removal of hardware, evacuation of hematoma on 10/30 -Now quadriplegic with dysphagia, unstageable sacral decubitus wounds, failure to thrive, recurrent hospitalizations -Palliative care consulted for goals of care, patient family wishes for full scope of treatment -Continues to be at high risk of ongoing decline, worsening wounds, recurrent aspiration pneumonia -Continues to be at high risk of ongoing decline, this was explained to patient's daughter again on 12/9  Chronic sacral decubitus ulcers POA Large sacral decubitus wound which is unstageable. -General surgery was consulted., no debridement needed, was getting hydrotherapy, cleanse choice vera flow wound VAC placed today  Severe protein calorie malnutrition -With  cachexia weight loss -Supplements encouraged  Essential hypertension/hypokalemia Blood pressure medications were on hold due to low BP.  Blood pressure remains relatively stable.   Anemia of chronic disease with some evidence of low B12 and borderline iron deficiency/acute blood loss anemia due to hematuria Continue B12 supplementation and iron.   -Transfused 2 units of PRBC this admission -Monitor, transfuse for hemoglobin less than 7  Chronic Eliquis use This was started for DVT prophylaxis at the rehab.  Has been discontinued.  Pt was later placed on heparin which was stopped due to hematuria.   Palliative care encounter -Overall poor prognosis, status post palliative care meeting, plan for full code full scope of treatment per daughter  DVT prophylaxis: SCD's Code Status: Full Family Communication: No family at bedside, updated niece 12/8, updated daughter [name] 12/9, poor prognosis reiterated  Status is: Inpatient  Remains inpatient  appropriate because:Unsafe d/c plan and Inpatient level of care appropriate due to severity of illness, persistent hematuria and now aspiration pneumonia   Dispo: The patient is from: SNF              Anticipated d/c is to: SNF  Anticipated d/c date is: 2 days if he stabilizes              Patient currently is not medically stable to d/c.   Consultants:   Urology  Procedures:     Antimicrobials: Anti-infectives (From admission, onward)   Start     Dose/Rate Route Frequency Ordered Stop   05/31/20 1400  ceFEPIme (MAXIPIME) 2 g in sodium chloride 0.9 % 100 mL IVPB        2 g 200 mL/hr over 30 Minutes Intravenous Every 8 hours 05/31/20 1240     05/24/20 1415  cefTRIAXone (ROCEPHIN) 1 g in sodium chloride 0.9 % 100 mL IVPB        1 g 200 mL/hr over 30 Minutes Intravenous Every 24 hours 05/24/20 1330 05/31/20 1110   05/23/20 1400  ceFEPIme (MAXIPIME) 2 g in sodium chloride 0.9 % 100 mL IVPB  Status:  Discontinued        2 g 200 mL/hr over 30 Minutes Intravenous Every 8 hours 05/23/20 1011 05/24/20 1330   05/22/20 0500  ceFEPIme (MAXIPIME) 2 g in sodium chloride 0.9 % 100 mL IVPB  Status:  Discontinued        2 g 200 mL/hr over 30 Minutes Intravenous Every 12 hours 05/21/20 1614 05/23/20 1011   05/21/20 2015  aztreonam (AZACTAM) 2 g in sodium chloride 0.9 % 100 mL IVPB        2 g 200 mL/hr over 30 Minutes Intravenous  Once 05/21/20 2010 05/21/20 2230   05/21/20 1730  vancomycin (VANCOREADY) IVPB 1750 mg/350 mL        1,750 mg 175 mL/hr over 120 Minutes Intravenous  Once 05/21/20 1722 05/21/20 2148   05/21/20 1545  ceFEPIme (MAXIPIME) 2 g in sodium chloride 0.9 % 100 mL IVPB        2 g 200 mL/hr over 30 Minutes Intravenous  Once 05/21/20 1535 05/21/20 1704      Subjective: Without complaints this AM  Objective: Vitals:   06/01/20 0811 06/01/20 1416 06/01/20 2100 06/02/20 0500  BP: (!) 81/52 96/65 113/60 111/62  Pulse: 94 87 88 87  Resp: 20 20 19 20   Temp: (!)  100.4 F (38 C) 98 F (36.7 C) 98.5 F (36.9 C) 97.7 F (36.5 C)  TempSrc:   Oral Oral  SpO2: 100% 100% 100% 97%  Weight:    106.6 kg  Height:        Intake/Output Summary (Last 24 hours) at 06/02/2020 1400 Last data filed at 06/02/2020 1334 Gross per 24 hour  Intake 424 ml  Output --  Net 424 ml   Filed Weights   05/22/20 0045 05/22/20 1115 06/02/20 0500  Weight: 120.7 kg 120.7 kg 106.6 kg    Examination: General exam: Chronically ill elderly male laying in bed, awake alert oriented to self and place HEENT: Hard cervical collar noted CVS: S1-S2, regular rate rhythm Lungs: Decreased breath sounds the bases Abdomen: Soft, nontender, bowel sounds present Extremities: 1+ edema Skin: Unstageable sacral decubitus ulcer Psychiatry: Affect normal // no auditory hallucinations   Data Reviewed: I have personally reviewed following labs and imaging studies  CBC: Recent Labs  Lab 05/29/20 0207 05/30/20 0119 05/31/20 0333 06/01/20 0234 06/02/20 0131  WBC 10.5 8.8 7.5 7.5 7.2  HGB 8.0* 8.8* 8.2* 8.0* 7.8*  HCT 24.8* 27.0* 25.2* 24.1* 24.8*  MCV 93.9 93.8 92.3 92.7 92.9  PLT 142* 148* 133* 122* 109*   Basic Metabolic Panel: Recent Labs  Lab  05/28/20 0116 05/29/20 0207 05/30/20 0119 05/31/20 0333 06/01/20 0234 06/02/20 0131  NA 145 144 142 138 138 138  K 3.7 3.8 3.8 3.8 3.9 3.6  CL 111 108 106 105 104 105  CO2 26 28 28 25 26 26   GLUCOSE 110* 92 112* 115* 107* 110*  BUN 11 8 6* 7* 11 10  CREATININE 0.69 0.66 0.66 0.67 0.77 0.67  CALCIUM 7.9* 8.0* 8.0* 7.4* 7.2* 7.0*  MG 1.6*  --   --   --   --   --    GFR: Estimated Creatinine Clearance: 102.2 mL/min (by C-G formula based on SCr of 0.67 mg/dL). Liver Function Tests: Recent Labs  Lab 06/02/20 0131  AST 63*  ALT 34  ALKPHOS 75  BILITOT 0.5  PROT 4.4*  ALBUMIN 1.4*   No results for input(s): LIPASE, AMYLASE in the last 168 hours. No results for input(s): AMMONIA in the last 168 hours. Coagulation  Profile: No results for input(s): INR, PROTIME in the last 168 hours. Cardiac Enzymes: No results for input(s): CKTOTAL, CKMB, CKMBINDEX, TROPONINI in the last 168 hours. BNP (last 3 results) No results for input(s): PROBNP in the last 8760 hours. HbA1C: No results for input(s): HGBA1C in the last 72 hours. CBG: Recent Labs  Lab 05/31/20 0025 05/31/20 0749 05/31/20 1229  GLUCAP 113* 102* 139*   Lipid Profile: No results for input(s): CHOL, HDL, LDLCALC, TRIG, CHOLHDL, LDLDIRECT in the last 72 hours. Thyroid Function Tests: No results for input(s): TSH, T4TOTAL, FREET4, T3FREE, THYROIDAB in the last 72 hours. Anemia Panel: No results for input(s): VITAMINB12, FOLATE, FERRITIN, TIBC, IRON, RETICCTPCT in the last 72 hours. Sepsis Labs: Recent Labs  Lab 06/01/20 1251  LATICACIDVEN 1.4    Recent Results (from the past 240 hour(s))  Culture, blood (routine x 2)     Status: None (Preliminary result)   Collection Time: 05/30/20  4:29 PM   Specimen: BLOOD LEFT HAND  Result Value Ref Range Status   Specimen Description BLOOD LEFT HAND  Final   Special Requests   Final    BOTTLES DRAWN AEROBIC AND ANAEROBIC Blood Culture results may not be optimal due to an inadequate volume of blood received in culture bottles   Culture   Final    NO GROWTH 3 DAYS Performed at [facility], [address] [place], [place] [address]    Report Status PENDING  Incomplete  Culture, blood (routine x 2)     Status: None (Preliminary result)   Collection Time: 05/30/20  4:32 PM   Specimen: BLOOD LEFT ARM  Result Value Ref Range Status   Specimen Description BLOOD LEFT ARM  Final   Special Requests   Final    BOTTLES DRAWN AEROBIC ONLY Blood Culture results may not be optimal due to an inadequate volume of blood received in culture bottles   Culture   Final    NO GROWTH 3 DAYS Performed at [facility], [address] [place], [place] [address]    Report Status PENDING  Incomplete   Culture, Urine     Status: Abnormal   Collection Time: 05/30/20  6:03 PM   Specimen: Urine, Random  Result Value Ref Range Status   Specimen Description URINE, RANDOM  Final   Special Requests   Final    NONE Performed at [facility], [address] [place], [place] [address]    Culture 20,000 COLONIES/mL ACHROMOBACTER XYLOSOXIDANS (A)  Final   Report Status  06/02/2020 FINAL  Final   Organism ID, Bacteria ACHROMOBACTER XYLOSOXIDANS (A)  Final      Susceptibility   Achromobacter xylosoxidans - MIC*    CEFAZOLIN >=64 RESISTANT Resistant     GENTAMICIN >=16 RESISTANT Resistant     CIPROFLOXACIN >=4 RESISTANT Resistant     IMIPENEM 8 INTERMEDIATE Intermediate     TRIMETH/SULFA <=20 SENSITIVE Sensitive     * 20,000 COLONIES/mL ACHROMOBACTER XYLOSOXIDANS     Radiology Studies: No results found.  Scheduled Meds: . Chlorhexidine Gluconate Cloth  6 each Topical Daily  . collagenase   Topical Daily  . docusate sodium  100 mg Oral BID  . ferrous sulfate  325 mg Oral BID WC  . midodrine  10 mg Oral BID WC  . oxybutynin  5 mg Oral QHS  . pantoprazole  40 mg Oral QHS  . tamsulosin  0.4 mg Oral Daily   Continuous Infusions: . ceFEPime (MAXIPIME) IV 2 g (06/02/20 0540)  . sodium chloride irrigation       LOS: 12 days   Zannie Cove, MD [facility]  06/02/2020, 2:00 PM

## 2020-06-02 NOTE — Progress Notes (Signed)
Physical Therapy Wound Treatment Patient Details  Name: Shannon [name] MRN: [id number] Date of Birth: [birth year]  Today's Date: 06/02/2020 Time: 1036-1201 Time Calculation (min): 85 min  Subjective  Subjective: Pt pleasant and agreeable to hydro. Alert and talkative today. Patient and Family Stated Goals: Heal wound Prior Treatments: Dressing changes  Pain Score:  Pt endorsing pain at beginning of session. RN able to give IV Dilaudid and pt tolerated the rest of the treatment well.    Wound Assessment  Pressure Injury 05/23/20 Sacrum Unstageable - Full thickness tissue loss in which the base of the injury is covered by slough (yellow, tan, gray, green or brown) and/or eschar (tan, brown or black) in the wound bed. (Active)  Dressing Type Negative pressure wound therapy 06/02/20 1334  Dressing Changed;Clean;Dry;Intact 06/02/20 1334  Dressing Change Frequency Monday, Wednesday, Friday 06/02/20 1334  State of Healing Early/partial granulation 06/02/20 1334  Site / Wound Assessment Red;Brown;Yellow 06/02/20 1334  % Wound base Red or Granulating 40% 06/02/20 1334  % Wound base Yellow/Fibrinous Exudate 50% 06/02/20 1334  % Wound base Black/Eschar 10% 06/02/20 1334  % Wound base Other/Granulation Tissue (Comment) 0% 06/02/20 1334  Peri-wound Assessment Intact;Pink 06/02/20 1334  Wound Length (cm) 11 cm 05/30/20 1206  Wound Width (cm) 9.5 cm 05/30/20 1206  Wound Depth (cm) 3.2 cm 05/30/20 1206  Wound Surface Area (cm^2) 104.5 cm^2 05/30/20 1206  Wound Volume (cm^3) 334.4 cm^3 05/30/20 1206  Margins Unattached edges (unapproximated) 06/02/20 1334  Drainage Amount Minimal 06/02/20 1334  Drainage Description Serosanguineous 06/02/20 1334  Treatment Hydrotherapy (Pulse lavage);Negative pressure wound therapy 06/02/20 1334     Negative Pressure Wound Therapy Sacrum Mid (Active)  Last dressing change 06/02/20 06/02/20 1334  Size See above 06/02/20 1334  Wound filler - Black foam 2  06/02/20 1334  Wound filler - Nonadherent 4 06/02/20 1334  Cycle Intermittent 06/02/20 1334  Target Pressure (mmHg) 125 06/02/20 1334  Instillation Volume 24 mL 06/02/20 1334  Instillation Solution Normal Saline 06/02/20 1334  Instillation Soak Time 10 minutes 06/02/20 1334  Instillation Therapy Time 3.5 hours 06/02/20 1334  Canister Changed No 06/02/20 1334  Dressing Status Intact 06/02/20 1334  Drainage Amount Minimal 06/02/20 1334  Drainage Description Serosanguineous 06/02/20 1334   Hydrotherapy Pulsed lavage therapy - wound location: Sacrum Pulsed Lavage with Suction (psi): 4 psi Pulsed Lavage with Suction - Normal Saline Used: 1000 mL Pulsed Lavage Tip: Tip with splash shield   Wound Assessment and Plan  Wound Therapy - Assess/Plan/Recommendations Wound Therapy - Clinical Statement: Pt able to get IV pain medicine this session which allowed for better tolerance for hydrotherapy. Cleanse Choice Vera Flow Wound VAC placed this session. Hydrotherapy now to see MWF unless called to the room to address the [facility] between sessions. Will continue to follow and progress as able per POC. Wound Therapy - Functional Problem List: Decreased mobility and global weakness Factors Delaying/Impairing Wound Healing: Immobility Hydrotherapy Plan: Debridement;Dressing change;Patient/family education;Pulsatile lavage with suction Wound Therapy - Frequency: 6X / week Wound Therapy - Follow Up Recommendations: Skilled nursing facility Wound Plan: See above  Wound Therapy Goals- Improve the function of patient's integumentary system by progressing the wound(s) through the phases of wound healing (inflammation - proliferation - remodeling) by: Decrease Necrotic Tissue to: 20% Decrease Necrotic Tissue - Progress: Progressing toward goal Increase Granulation Tissue to: 80% Increase Granulation Tissue - Progress: Progressing toward goal Goals/treatment plan/discharge plan were made with and agreed upon by  patient/family: Yes Time For Goal Achievement: 7 days  Wound Therapy - Potential for Goals: Fair  Goals will be updated until maximal potential achieved or discharge criteria met.  Discharge criteria: when goals achieved, discharge from hospital, MD decision/surgical intervention, no progress towards goals, refusal/missing three consecutive treatments without notification or medical reason.  GP     Thelma Comp 06/02/2020, 1:48 PM   Rolinda Roan, PT, DPT Acute Rehabilitation Services Pager: [phone] Office: [phone]

## 2020-06-02 NOTE — Progress Notes (Addendum)
This chaplain is present for F/U spiritual care with the Pt. PT is in the room beginning PT care. Time is allowed for the chaplain to greet the Pt. and make plans to F/U later today.  *1249 Chaplain is present bedside. Pt. is visiting with his nephew-Michael.  F/U spiritual care available will continue to be offered.

## 2020-06-02 NOTE — Progress Notes (Addendum)
Initial Nutrition Assessment  DOCUMENTATION CODES:   Not applicable  INTERVENTION:   If aggressive care desired recommend placement of Cortrak with enteral nutrition  Would likely benefit from long term tube placement given prolonged poor PO intake.    Magic cup TID with meals, each supplement provides 290 kcal and 9 grams of protein  Ensure Enlive po TID, each supplement provides 350 kcal and 20 grams of protein (thickened)  NUTRITION DIAGNOSIS:   Increased nutrient needs related to wound healing as evidenced by estimated needs.  GOAL:   Patient will meet greater than or equal to 90% of their needs  MONITOR:   PO intake,Supplement acceptance,Diet advancement,Weight trends,Labs,I & O's  REASON FOR ASSESSMENT:   LOS    ASSESSMENT:   Patient with PMH significant for quadriplegia, Chiari I malformation with syringomyelia, ACDF, recent fall with resulting epidural hematoma (evacuation 10/30), dysphagia, and unstageable sacral decubitus wounds. Presents this admission with UTI and aspiration PNA.   Pt undergoing dressing change at time of RD visit. Unable to obtain nutrition/weight history. Of note, pt has been on full nectar thick liquids since 11/11. Will need a repeat MBS Monday to assess if diet can be further advanced.   Patient unlikely to meet nutrition needs PO alone with current diet. GOC discussions are ongoing. If full scope/full treatment desired recommend long term feeding tube placement with initiation of enteral nutrition.   Records indicate pt has gained weight since last admission on 11/10. Suspect this is fluid related. Suspect severe malnutrition ongoing but will need NFPE to make official diagnosis.   UOP: 1400 ml x 24 hrs   Medications: colace Labs: CBG 102-139   Diet Order:   Diet Order            Diet full liquid Room service appropriate? Yes; Fluid consistency: Nectar Thick  Diet effective now                 EDUCATION NEEDS:   Not  appropriate for education at this time  Skin:  Skin Assessment: Skin Integrity Issues: Skin Integrity Issues:: Stage II,Unstageable Stage II: buttocks Unstageable: bilateral sacrum  Last BM:  12/9  Height:   Ht Readings from Last 1 Encounters:  05/22/20 6' (1.829 m)    Weight:   Wt Readings from Last 1 Encounters:  06/02/20 106.6 kg    BMI:  Body mass index is 31.87 kg/m.  Estimated Nutritional Needs:   Kcal:  2200-2400 kcal  Protein:  110-130 grams  Fluid:  >/= 2 L/day  Vanessa Kick RD, LDN Clinical Nutrition Pager listed in [facility]

## 2020-06-02 NOTE — Progress Notes (Signed)
Occupational Therapy Treatment Patient Details Name: Shannon [name] MRN: [id number] DOB: March 18, 1946 Today's Date: 06/02/2020    History of present illness 74 y.o. male with medical history significant of quadriplegia since 2007, Chiari I malformation with syringomyelia. C3-4 and C4-5 anterior cervical discectomy and decompression in 04/05/2020. Had a fall at home developed epidural hematoma, underwent reexploration, removal of hardware, evacuation of hematoma on 10/30, now quadriplegic with dysphagia, unstageable sacral decubitus wounds, failure to thrive, recurrent hospitalizations. Readmitted 11/28 with septic shock, severe frank hematuria and UTI.   OT comments  Pt making gradual progress towards OT goals this session. Pt continues to present with increased pain, generalized weakness and decreased activity tolerance. Session focus on self feeding, bed mobility and active and passive ROM of BUEs. Issued pt level 1 theraband tied to bed rails to work on AROM, issued pt squeeze ball for R hand as pt reports wanting to be able to self feed again with RUE. Pt completed therex as indicated below. Pt presenting with decreased insight into deficits as pt continuing to allude to wanting to be able to do everything he was doing before, initiated education on diagnosis of quadraplegia with little acknowlegement of understanding, education provided on level of care pt currently requiring and level of care determined by diagnosis. Pt would continue to benefit from skilled occupational therapy while admitted and after d/c to address the below listed limitations in order to improve overall functional mobility and facilitate independence with BADL participation. DC plan remains appropriate, will follow acutely per POC.     Follow Up Recommendations  SNF;Supervision/Assistance - 24 hour    Equipment Recommendations  None recommended by OT    Recommendations for Other Services      Precautions /  Restrictions Precautions Precautions: Fall;Cervical Precaution Booklet Issued: No Precaution Comments: pt not able to recieve instructions at this time  Required Braces or Orthoses: Cervical Brace Cervical Brace: Hard collar;At all times Restrictions Weight Bearing Restrictions: No       Mobility Bed Mobility Overal bed mobility: Needs Assistance Bed Mobility: Rolling Rolling: Max assist         General bed mobility comments: MAX A to reposition in bed  Transfers                 General transfer comment: defer d/t pain and lack of +2 assist    Balance                                           ADL either performed or assessed with clinical judgement   ADL Overall ADL's : Needs assistance/impaired Eating/Feeding: Moderate assistance;Sitting Eating/Feeding Details (indicate cue type and reason): simulated via functional hand to mouth pattern with pt needing MOD A at elbow on RUE to stabilize                                   General ADL Comments: pt presenting with decreased insight into deficits as pt continuing to allude to wanting to be able to do everything he was doing before, initiated education on diagnosis of quadraplegic with little acknowlegement of understanding, education provided on level of care pt currently requiring and level of care determined by diagnosis of quad, unable to mobilize more d/t lack of +2 assist. session focus on self feeding, bed mobility  and AROM of BUEs. issued pt level 1 theraband tied to bed rails to work on AROM, issued pt squeeze ball for R hand as pt reports wanting to be able to self feed again with RUE     Vision       Perception     Praxis      Cognition Arousal/Alertness: Awake/alert Behavior During Therapy: WFL for tasks assessed/performed Overall Cognitive Status: No family/caregiver present to determine baseline cognitive functioning Area of Impairment: Attention;Following  commands;Safety/judgement;Awareness;Orientation                 Orientation Level: Disoriented to;Time Current Attention Level: Sustained Memory: Decreased short-term memory;Decreased recall of precautions Following Commands: Follows one step commands consistently Safety/Judgement: Decreased awareness of safety;Decreased awareness of deficits Awareness: Emergent Problem Solving: Slow processing;Decreased initiation;Difficulty sequencing;Requires verbal cues;Requires tactile cues General Comments: noted to keep eyes closed majority of session this date, continues to present with decreased awareness into deficits related to quadraplegic. disoriented to day of week this session        Exercises General Exercises - Upper Extremity Shoulder Flexion: PROM;Both;10 reps;Limitations Shoulder Flexion Limitations: 90* Elbow Flexion: PROM;Both;10 reps Elbow Extension: AROM;Both;5 reps;Supine Wrist Flexion: PROM;Both;5 reps;Supine Wrist Extension: PROM;Both;10 reps Digit Composite Flexion: PROM;Both;10 reps Composite Extension: PROM;Both;10 reps Hand Exercises Forearm Supination: PROM;Both;10 reps Forearm Pronation: PROM;Both;10 reps Wrist Flexion: AAROM;Both;10 reps;Supine Wrist Extension: AAROM;Both;10 reps;Supine Other Exercises Other Exercises: scapular elevation / depression x10 reps Other Exercises: level 1 theraband applied to bed rails; encouraged pt to ask RN to assist pt with placing hands in straps to complete elbow/ flexion and shoulder flexion/ extension with assist Other Exercises: squeeze ball issued to work composite flexion/ extension   Shoulder Instructions       General Comments s/u call bell on pts L hand, issued pt level 1 theraband and squeeze ball for RUE. noted decreased sensation in RUE as pt noted to drop spoon in RUE with no awareness    Pertinent Vitals/ Pain       Pain Assessment: Faces Faces Pain Scale: Hurts whole lot Pain Location: extremities with  PROM Pain Descriptors / Indicators: Grimacing;Guarding Pain Intervention(s): Limited activity within patient's tolerance;Monitored during session;Repositioned  Home Living                                          Prior Functioning/Environment              Frequency  Min 2X/week        Progress Toward Goals  OT Goals(current goals can now be found in the care plan section)  Progress towards OT goals: Progressing toward goals  Acute Rehab OT Goals Patient Stated Goal: To walk again OT Goal Formulation: With patient Time For Goal Achievement: 06/09/20 Potential to Achieve Goals: Fair  Plan Discharge plan remains appropriate;Frequency remains appropriate    Co-evaluation                 AM-PAC OT "6 Clicks" Daily Activity     Outcome Measure   Help from another person eating meals?: A Lot Help from another person taking care of personal grooming?: A Lot Help from another person toileting, which includes using toliet, bedpan, or urinal?: Total Help from another person bathing (including washing, rinsing, drying)?: Total Help from another person to put on and taking off regular upper body clothing?: Total Help from another person to  put on and taking off regular lower body clothing?: Total 6 Click Score: 8    End of Session Equipment Utilized During Treatment: Cervical collar  OT Visit Diagnosis: Other abnormalities of gait and mobility (R26.89);Muscle weakness (generalized) (M62.81);Pain   Activity Tolerance Patient tolerated treatment well   Patient Left in bed;with call bell/phone within reach   Nurse Communication Mobility status        Time: 1522-1600 OT Time Calculation (min): 38 min  Charges: OT General Charges $OT Visit: 1 Visit OT Treatments $Therapeutic Exercise: 38-52 mins  Audery Amel., COTA/L Acute Rehabilitation Services [phone]    Angelina Pih 06/02/2020, 4:21 PM

## 2020-06-02 NOTE — Progress Notes (Signed)
  Speech Language Pathology Treatment: Dysphagia  Patient Details Name: Shannon [name] MRN: [id number] DOB: [birth year] Today's Date: 06/02/2020 Time: 0945-1000 SLP Time Calculation (min) (ACUTE ONLY): 15 min  Assessment / Plan / Recommendation Clinical Impression  Pt was seen for dysphagia treatment. He was alert and cooperative during the session. Pt tolerated puree solids and nectar thick liquids without overt s/sx of aspiration. Oral phase appeared mildly prolonged with masticatory efforts with purees. Pt has been on a full liquid (nectar thick) diet since 11/11 and SLP questions his ability to maintain adequate nutrition on this diet. A repeat modified barium swallow study is recommended to re-assess swallow function and determine whether his diet can be safely advanced. Recommendations were discussed with the pt and his daughter; both parties were in agreement with plan to repeat a modified barium swallow study. The study will not be able to be conducted today due to radiology's schedule, but SLP will f/u next week for completion.    HPI HPI: Shannon Komar Smithis a 74 y.o.malewith medical history significant ofquadriplegia which follows surgery for Chiari I malformation with syringomyelia surgery in October 2021.  He originally had a C3-4 [place] 4-5 anterior cervical disc ACDF with decompression, C3 3-4 and C4-5 interbody arthrodesis with local autograft bone, anterior cervical plating of C3-C5 on 04/05/2020.  He then went home and had a fall from his bed, he was then hospitalized and from here he was sent to a nursing home, he has been quadriplegic since then, he has also developed sacral decubitus ulcers. Was seen for MBS on 05/04/20 "deficits resulted in silent aspiration of thin liquid and nectar thick liquid by straw prior to the swallow. With nectar thick liquid by cup, only transient penetration was seen. There was significant pharyngeal residue with puree and solid consistencies which was  reduced but not fully cleared with liquid wash." Now admitted for AMS due to UTI, sepsis and hypotension in a patient with indwelling Foley catheter POA and sacral decubitus ulcers POA who is quadriplegic. Pt/niece preferred to remain on nectar thick full liquids- pt stable and ST signed off. Re-ordered by hospitalist "suspicion for aspiration pna".        SLP Plan  MBS       Recommendations  Diet recommendations: Nectar-thick liquid via cup Medication Administration: Crushed with puree Compensations: Slow rate;Small sips/bites;Follow solids with liquid                Oral Care Recommendations: Oral care BID Follow up Recommendations: Skilled Nursing facility SLP Visit Diagnosis: Dysphagia, oropharyngeal phase (R13.12) Plan: MBS       Catheleen Langhorne I. Vear Clock, MS, CCC-SLP Acute Rehabilitation Services Office number [phone] Pager [phone]                Scheryl Marten 06/02/2020, 1:15 PM

## 2020-06-03 LAB — CBC
HCT: 24.4 % — ABNORMAL LOW (ref 39.0–52.0)
Hemoglobin: 7.8 g/dL — ABNORMAL LOW (ref 13.0–17.0)
MCH: 29.2 pg (ref 26.0–34.0)
MCHC: 32 g/dL (ref 30.0–36.0)
MCV: 91.4 fL (ref 80.0–100.0)
Platelets: 119 10*3/uL — ABNORMAL LOW (ref 150–400)
RBC: 2.67 MIL/uL — ABNORMAL LOW (ref 4.22–5.81)
RDW: 14.6 % (ref 11.5–15.5)
WBC: 5.5 10*3/uL (ref 4.0–10.5)
nRBC: 0 % (ref 0.0–0.2)

## 2020-06-03 LAB — BASIC METABOLIC PANEL
Anion gap: 5 (ref 5–15)
BUN: 7 mg/dL — ABNORMAL LOW (ref 8–23)
CO2: 26 mmol/L (ref 22–32)
Calcium: 7.3 mg/dL — ABNORMAL LOW (ref 8.9–10.3)
Chloride: 105 mmol/L (ref 98–111)
Creatinine, Ser: 0.58 mg/dL — ABNORMAL LOW (ref 0.61–1.24)
GFR, Estimated: >60 mL/min (ref >60)
Glucose, Bld: 108 mg/dL — ABNORMAL HIGH (ref 70–99)
Potassium: 3.5 mmol/L (ref 3.5–5.1)
Sodium: 136 mmol/L (ref 135–145)

## 2020-06-03 NOTE — Progress Notes (Signed)
PROGRESS NOTE    Shannon [name]  ZOX:[id number] DOB: [birth year] DOA: 05/21/2020 PCP: Shannon [name], Philip H, MD   Brief Narrative:  74 y.o.malewith medical history significant ofquadriplegia, Chiari I malformation with syringomyelia, ACDF in October 2021, residual quadriplegia, then had a fall at home developed epidural hematoma, underwent reexploration, removal of hardware, evacuation of hematoma on 10/30, now quadriplegic with dysphagia, unstageable sacral decubitus wounds, failure to thrive, recurrent hospitalizations -Subsequently hospitalized with aspiration pneumonia, then went to SNF -Readmitted 11/28 with septic shock, severe frank hematuria and UTI -Treated with antibiotics, started continuous bladder irrigation -Also seen by palliative care this admission patient and daughter want full scope of treatment -12/7: Developed worsening cough, fevers and tachycardia, started antibiotics for aspiration pneumonia 12/8-9: Overnight with hypotension  Assessment & Plan:    Septic shock  Acute metabolic encephalopathy due to UTI, sepsis and hypotension -Blood pressure improved initially, completed 10-day course of ceftriaxone 12/8 -12/8-9 Overnight with hypotension again, antibiotics changed to treat aspiration pneumonia, blood pressure stabilizing, cut down IV fluids -Midodrine added  Frank hematuria -In the setting of indwelling Foley catheter/urethral injury and UTI -Seen by urology for persistent frank hematuria -Required CBI last week and part of this week, now discontinued -Hematuria has resolved -Will need Foley catheter at discharge, urology following as needed -Completed 10-day course of ceftriaxone 12/8  Aspiration pneumonia Severe dysphagia -Fever and tachycardia on 12/7, reports slight worsening cough -Chest x-ray with persistent lower lobe opacities -Started on IV cefepime for aspiration pneumonia, penicillin allergy noted, SLP following -felt to be at high risk of  aspiration, remains on thickened liquids, oral intake is marginally better -Plan for repeat modified barium swallow noted  Cervical myelopathy, Quadriplegia Severe dysphagia -Had C-spine surgery on 10/13/ACDF -Subsequently had a fall at home developed epidural hematoma, underwent reexploration, removal of hardware, evacuation of hematoma on 10/30 -Remains quadriplegic with dysphagia, unstageable sacral decubitus wounds, failure to thrive, recurrent hospitalizations -Palliative care consulted for goals of care, patient family wishes for full scope of treatment -Continues to be at high risk of ongoing decline, worsening wounds, recurrent aspiration pneumonia, this was explained to patient's daughter again on 12/9  Chronic sacral decubitus ulcers POA Large sacral decubitus wound which is unstageable. -General surgery was consulted., no debridement needed, was getting hydrotherapy, cleanse choice vera flow wound VAC placed  Severe protein calorie malnutrition -With  cachexia weight loss -Supplements encouraged  Essential hypertension/hypokalemia Blood pressure medications were on hold due to low BP.  Blood pressure remains relatively stable.   Anemia of chronic disease with some evidence of low B12 and borderline iron deficiency/acute blood loss anemia due to hematuria Continue B12 supplementation and iron.   -Transfused 2 units of PRBC this admission -Monitor, transfuse for hemoglobin less than 7  Chronic Eliquis use This was started for DVT prophylaxis at the rehab.  Has been discontinued.  Pt was later placed on heparin which was stopped due to hematuria.   Palliative care encounter -Overall poor prognosis, status post palliative care meeting, plan for full code full scope of treatment per daughter  DVT prophylaxis: SCD's Code Status: Full Family Communication: No family at bedside, updated niece 12/8, updated daughter [name] 12/9, poor prognosis reiterated  Status is:  Inpatient  Remains inpatient appropriate because:Unsafe d/c plan and Inpatient level of care appropriate due to severity of illness, persistent hematuria and now aspiration pneumonia   Dispo: The patient is from: SNF              Anticipated d/c  is to: SNF              Anticipated d/c date is: 2 days if he stays stable              Patient currently is not medically stable to d/c.   Consultants:   Urology  Procedures:     Antimicrobials: Anti-infectives (From admission, onward)   Start     Dose/Rate Route Frequency Ordered Stop   05/31/20 1400  ceFEPIme (MAXIPIME) 2 g in sodium chloride 0.9 % 100 mL IVPB        2 g 200 mL/hr over 30 Minutes Intravenous Every 8 hours 05/31/20 1240     05/24/20 1415  cefTRIAXone (ROCEPHIN) 1 g in sodium chloride 0.9 % 100 mL IVPB        1 g 200 mL/hr over 30 Minutes Intravenous Every 24 hours 05/24/20 1330 05/31/20 1110   05/23/20 1400  ceFEPIme (MAXIPIME) 2 g in sodium chloride 0.9 % 100 mL IVPB  Status:  Discontinued        2 g 200 mL/hr over 30 Minutes Intravenous Every 8 hours 05/23/20 1011 05/24/20 1330   05/22/20 0500  ceFEPIme (MAXIPIME) 2 g in sodium chloride 0.9 % 100 mL IVPB  Status:  Discontinued        2 g 200 mL/hr over 30 Minutes Intravenous Every 12 hours 05/21/20 1614 05/23/20 1011   05/21/20 2015  aztreonam (AZACTAM) 2 g in sodium chloride 0.9 % 100 mL IVPB        2 g 200 mL/hr over 30 Minutes Intravenous  Once 05/21/20 2010 05/21/20 2230   05/21/20 1730  vancomycin (VANCOREADY) IVPB 1750 mg/350 mL        1,750 mg 175 mL/hr over 120 Minutes Intravenous  Once 05/21/20 1722 05/21/20 2148   05/21/20 1545  ceFEPIme (MAXIPIME) 2 g in sodium chloride 0.9 % 100 mL IVPB        2 g 200 mL/hr over 30 Minutes Intravenous  Once 05/21/20 1535 05/21/20 1704      Subjective: -Uncomfortable in bed, request repositioning, drinking a little better  Objective: Vitals:   06/02/20 0500 06/02/20 2006 06/02/20 2035 06/03/20 0828  BP: 111/62  (!) 100/59  (!) 111/54  Pulse: 87 (!) 103  (!) 102  Resp: 20 15  16   Temp: 97.7 F (36.5 C) 100.1 F (37.8 C) 98.4 F (36.9 C)   TempSrc: Oral     SpO2: 97% 97%  93%  Weight: 106.6 kg     Height:        Intake/Output Summary (Last 24 hours) at 06/03/2020 1125 Last data filed at 06/03/2020 0617 Gross per 24 hour  Intake 1916.54 ml  Output 500 ml  Net 1416.54 ml   Filed Weights   05/22/20 0045 05/22/20 1115 06/02/20 0500  Weight: 120.7 kg 120.7 kg 106.6 kg    Examination: General exam: Chronically ill elderly male, laying in bed awake alert oriented to self and place HEENT: Hard cervical collar on 7 CVS: S1-S2, regular rate rhythm Lungs: Distant breath sounds anteriorly Abdomen: Soft, nontender bowel sounds present Extremities: 1+ edema Skin: Unstageable sacral decubitus ulcer Neuro: Quadriplegic  Psychiatry: Flat affect  Data Reviewed: I have personally reviewed following labs and imaging studies  CBC: Recent Labs  Lab 05/30/20 0119 05/31/20 0333 06/01/20 0234 06/02/20 0131 06/03/20 0101  WBC 8.8 7.5 7.5 7.2 5.5  HGB 8.8* 8.2* 8.0* 7.8* 7.8*  HCT 27.0* 25.2* 24.1* 24.8* 24.4*  MCV 93.8  92.3 92.7 92.9 91.4  PLT 148* 133* 122* 109* 119*   Basic Metabolic Panel: Recent Labs  Lab 05/28/20 0116 05/29/20 0207 05/30/20 0119 05/31/20 0333 06/01/20 0234 06/02/20 0131 06/03/20 0101  NA 145   < > 142 138 138 138 136  K 3.7   < > 3.8 3.8 3.9 3.6 3.5  CL 111   < > 106 105 104 105 105  CO2 26   < > 28 25 26 26 26   GLUCOSE 110*   < > 112* 115* 107* 110* 108*  BUN 11   < > 6* 7* 11 10 7*  CREATININE 0.69   < > 0.66 0.67 0.77 0.67 0.58*  CALCIUM 7.9*   < > 8.0* 7.4* 7.2* 7.0* 7.3*  MG 1.6*  --   --   --   --   --   --    < > = values in this interval not displayed.   GFR: Estimated Creatinine Clearance: 102.2 mL/min (A) (by C-G formula based on SCr of 0.58 mg/dL (L)). Liver Function Tests: Recent Labs  Lab 06/02/20 0131  AST 63*  ALT 34  ALKPHOS 75   BILITOT 0.5  PROT 4.4*  ALBUMIN 1.4*   No results for input(s): LIPASE, AMYLASE in the last 168 hours. No results for input(s): AMMONIA in the last 168 hours. Coagulation Profile: No results for input(s): INR, PROTIME in the last 168 hours. Cardiac Enzymes: No results for input(s): CKTOTAL, CKMB, CKMBINDEX, TROPONINI in the last 168 hours. BNP (last 3 results) No results for input(s): PROBNP in the last 8760 hours. HbA1C: No results for input(s): HGBA1C in the last 72 hours. CBG: Recent Labs  Lab 05/31/20 0025 05/31/20 0749 05/31/20 1229  GLUCAP 113* 102* 139*   Lipid Profile: No results for input(s): CHOL, HDL, LDLCALC, TRIG, CHOLHDL, LDLDIRECT in the last 72 hours. Thyroid Function Tests: No results for input(s): TSH, T4TOTAL, FREET4, T3FREE, THYROIDAB in the last 72 hours. Anemia Panel: No results for input(s): VITAMINB12, FOLATE, FERRITIN, TIBC, IRON, RETICCTPCT in the last 72 hours. Sepsis Labs: Recent Labs  Lab 06/01/20 1251  LATICACIDVEN 1.4    Recent Results (from the past 240 hour(s))  Culture, blood (routine x 2)     Status: None (Preliminary result)   Collection Time: 05/30/20  4:29 PM   Specimen: BLOOD LEFT HAND  Result Value Ref Range Status   Specimen Description BLOOD LEFT HAND  Final   Special Requests   Final    BOTTLES DRAWN AEROBIC AND ANAEROBIC Blood Culture results may not be optimal due to an inadequate volume of blood received in culture bottles   Culture   Final    NO GROWTH 4 DAYS Performed at [facility], [address], [place], [place]    Report Status PENDING  Incomplete  Culture, blood (routine x 2)     Status: None (Preliminary result)   Collection Time: 05/30/20  4:32 PM   Specimen: BLOOD LEFT ARM  Result Value Ref Range Status   Specimen Description BLOOD LEFT ARM  Final   Special Requests   Final    BOTTLES DRAWN AEROBIC ONLY Blood Culture results may not be optimal due to an inadequate volume of blood received  in culture bottles   Culture   Final    NO GROWTH 4 DAYS Performed at [facility], [address], [place], [place]    Report Status PENDING  Incomplete  Culture, Urine  Status: Abnormal   Collection Time: 05/30/20  6:03 PM   Specimen: Urine, Random  Result Value Ref Range Status   Specimen Description URINE, RANDOM  Final   Special Requests   Final    NONE Performed at [facility], [address], [place], [place] [address]    Culture 20,000 COLONIES/mL ACHROMOBACTER XYLOSOXIDANS (A)  Final   Report Status 06/02/2020 FINAL  Final   Organism ID, Bacteria ACHROMOBACTER XYLOSOXIDANS (A)  Final      Susceptibility   Achromobacter xylosoxidans - MIC*    CEFAZOLIN >=64 RESISTANT Resistant     GENTAMICIN >=16 RESISTANT Resistant     CIPROFLOXACIN >=4 RESISTANT Resistant     IMIPENEM 8 INTERMEDIATE Intermediate     TRIMETH/SULFA <=20 SENSITIVE Sensitive     * 20,000 COLONIES/mL ACHROMOBACTER XYLOSOXIDANS     Radiology Studies: No results found.  Scheduled Meds: . Chlorhexidine Gluconate Cloth  6 each Topical Daily  . collagenase   Topical Daily  . docusate sodium  100 mg Oral BID  . feeding supplement  237 mL Oral TID BM  . ferrous sulfate  325 mg Oral BID WC  . midodrine  10 mg Oral BID WC  . oxybutynin  5 mg Oral QHS  . pantoprazole  40 mg Oral QHS  . tamsulosin  0.4 mg Oral Daily   Continuous Infusions: . ceFEPime (MAXIPIME) IV 2 g (06/03/20 0552)  . ferumoxytol 510 mg (06/02/20 2158)  . sodium chloride irrigation       LOS: 13 days   Zannie Cove, MD [facility]  06/03/2020, 11:24 AM

## 2020-06-03 NOTE — Plan of Care (Signed)
  Problem: Education: Goal: Knowledge of General Education information will improve Description: Including pain rating scale, medication(s)/side effects and non-pharmacologic comfort measures 06/03/2020 0312 by Elray Mcgregor, LPN Outcome: Progressing 06/03/2020 0232 by Elray Mcgregor, LPN Outcome: Progressing   Problem: Health Behavior/Discharge Planning: Goal: Ability to manage health-related needs will improve 06/03/2020 0312 by Elray Mcgregor, LPN Outcome: Progressing 06/03/2020 0232 by Elray Mcgregor, LPN Outcome: Progressing   Problem: Clinical Measurements: Goal: Ability to maintain clinical measurements within normal limits will improve 06/03/2020 0312 by Elray Mcgregor, LPN Outcome: Progressing 06/03/2020 0232 by Elray Mcgregor, LPN Outcome: Progressing Goal: Will remain free from infection 06/03/2020 0312 by Elray Mcgregor, LPN Outcome: Progressing 06/03/2020 0232 by Elray Mcgregor, LPN Outcome: Progressing Goal: Diagnostic test results will improve 06/03/2020 0312 by Elray Mcgregor, LPN Outcome: Progressing 06/03/2020 0232 by Elray Mcgregor, LPN Outcome: Progressing Goal: Respiratory complications will improve 06/03/2020 0312 by Elray Mcgregor, LPN Outcome: Progressing 06/03/2020 0232 by Elray Mcgregor, LPN Outcome: Progressing Goal: Cardiovascular complication will be avoided 06/03/2020 0312 by Elray Mcgregor, LPN Outcome: Progressing 06/03/2020 0232 by Elray Mcgregor, LPN Outcome: Progressing   Problem: Activity: Goal: Risk for activity intolerance will decrease 06/03/2020 0312 by Elray Mcgregor, LPN Outcome: Progressing 06/03/2020 0232 by Elray Mcgregor, LPN Outcome: Progressing   Problem: Nutrition: Goal: Adequate nutrition will be maintained 06/03/2020 0312 by Elray Mcgregor, LPN Outcome: Progressing 06/03/2020 0232 by Elray Mcgregor, LPN Outcome:  Progressing   Problem: Coping: Goal: Level of anxiety will decrease 06/03/2020 0312 by Elray Mcgregor, LPN Outcome: Progressing 06/03/2020 0232 by Elray Mcgregor, LPN Outcome: Progressing   Problem: Elimination: Goal: Will not experience complications related to bowel motility 06/03/2020 0312 by Elray Mcgregor, LPN Outcome: Progressing 06/03/2020 0232 by Elray Mcgregor, LPN Outcome: Progressing Goal: Will not experience complications related to urinary retention 06/03/2020 0312 by Elray Mcgregor, LPN Outcome: Progressing 06/03/2020 0232 by Elray Mcgregor, LPN Outcome: Progressing   Problem: Pain Managment: Goal: General experience of comfort will improve 06/03/2020 0312 by Elray Mcgregor, LPN Outcome: Progressing 06/03/2020 0232 by Elray Mcgregor, LPN Outcome: Progressing   Problem: Safety: Goal: Ability to remain free from injury will improve 06/03/2020 0312 by Elray Mcgregor, LPN Outcome: Progressing 06/03/2020 0232 by Elray Mcgregor, LPN Outcome: Progressing   Problem: Skin Integrity: Goal: Risk for impaired skin integrity will decrease 06/03/2020 0312 by Elray Mcgregor, LPN Outcome: Progressing 06/03/2020 0232 by Elray Mcgregor, LPN Outcome: Progressing

## 2020-06-03 NOTE — Plan of Care (Signed)

## 2020-06-04 LAB — BASIC METABOLIC PANEL
Anion gap: 7 (ref 5–15)
BUN: 9 mg/dL (ref 8–23)
CO2: 26 mmol/L (ref 22–32)
Calcium: 7.3 mg/dL — ABNORMAL LOW (ref 8.9–10.3)
Chloride: 102 mmol/L (ref 98–111)
Creatinine, Ser: 0.54 mg/dL — ABNORMAL LOW (ref 0.61–1.24)
GFR, Estimated: >60 mL/min (ref >60)
Glucose, Bld: 97 mg/dL (ref 70–99)
Potassium: 3.4 mmol/L — ABNORMAL LOW (ref 3.5–5.1)
Sodium: 135 mmol/L (ref 135–145)

## 2020-06-04 LAB — CULTURE, BLOOD (ROUTINE X 2)
Culture: NO GROWTH
Culture: NO GROWTH

## 2020-06-04 LAB — CBC
HCT: 23 % — ABNORMAL LOW (ref 39.0–52.0)
Hemoglobin: 7.7 g/dL — ABNORMAL LOW (ref 13.0–17.0)
MCH: 30.3 pg (ref 26.0–34.0)
MCHC: 33.5 g/dL (ref 30.0–36.0)
MCV: 90.6 fL (ref 80.0–100.0)
Platelets: 109 10*3/uL — ABNORMAL LOW (ref 150–400)
RBC: 2.54 MIL/uL — ABNORMAL LOW (ref 4.22–5.81)
RDW: 14.7 % (ref 11.5–15.5)
WBC: 4.9 10*3/uL (ref 4.0–10.5)
nRBC: 0 % (ref 0.0–0.2)

## 2020-06-04 MED ORDER — POTASSIUM CHLORIDE 20 MEQ PO PACK
40.0000 meq | PACK | Freq: Once | ORAL | Status: AC
  Administered 2020-06-04: 40 meq via ORAL
  Filled 2020-06-04: qty 2

## 2020-06-04 NOTE — Plan of Care (Signed)

## 2020-06-04 NOTE — Progress Notes (Signed)
PROGRESS NOTE    Shannon [name]  FGH:[id number] DOB: 08/03/1945 DOA: 05/21/2020 PCP: [name], MD   Brief Narrative:  74 y.o.malewith medical history significant ofquadriplegia, Chiari I malformation with syringomyelia, ACDF in October 2021, residual quadriplegia, then had a fall at home developed epidural hematoma, underwent reexploration, removal of hardware, evacuation of hematoma on 10/30, now quadriplegic with dysphagia, unstageable sacral decubitus wounds, failure to thrive, recurrent hospitalizations -Subsequently hospitalized with aspiration pneumonia, then went to SNF -Readmitted 11/28 with septic shock, severe frank hematuria and UTI -Treated with antibiotics, started continuous bladder irrigation -Also seen by palliative care this admission patient and daughter want full scope of treatment -12/7: Developed worsening cough, fevers and tachycardia, started antibiotics for aspiration pneumonia 12/8-9: Overnight with hypotension  Assessment & Plan:    Septic shock  Acute metabolic encephalopathy due to UTI, sepsis and hypotension -Blood pressure improved initially, completed 10-day course of ceftriaxone 12/8 -12/8-9 Overnight with hypotension again, antibiotics changed to treat aspiration pneumonia, blood pressure stabilizing, cut down IV fluids -Midodrine added  Frank hematuria -In the setting of indwelling Foley catheter/urethral injury and UTI -Seen by urology for persistent frank hematuria -Required CBI last week and part of this week, now discontinued -Hematuria has resolved -Will need Foley catheter at discharge, urology following as needed -Completed 10-day course of ceftriaxone on 12/8  Aspiration pneumonia Severe dysphagia -Developed fever and tachycardia on 12/7, productive cough -Chest x-ray with persistent lower lobe opacities -Started on IV cefepime for aspiration pneumonia, penicillin allergy noted, SLP following -felt to be at high risk of  aspiration, remains on thickened liquids, oral intake is marginally better -Day 5 of antibiotics today, discontinue after 7 days -Plan for repeat modified barium swallow noted  Cervical myelopathy, Quadriplegia Severe dysphagia -Had C-spine surgery on 10/13/ACDF -Subsequently had a fall at home developed epidural hematoma, underwent reexploration, removal of hardware, evacuation of hematoma on 10/30 -Remains quadriplegic with dysphagia, unstageable sacral decubitus wounds, failure to thrive, recurrent hospitalizations -Discussed with neurosurgery, recommended to leave cervical collar on for 3 months total, i.e. mid January -Palliative care consulted for goals of care, patient family wishes for full scope of treatment -Continues to be at high risk of ongoing decline, worsening wounds, recurrent aspiration pneumonia, this was explained to patient's daughter again on 12/9  Chronic sacral decubitus ulcers POA Large sacral decubitus wound which is unstageable. -General surgery was consulted., no debridement needed, was getting hydrotherapy, cleanse choice vera flow wound VAC placed  Severe protein calorie malnutrition -With  cachexia weight loss -Supplements encouraged  Essential hypertension/hypokalemia Blood pressure medications were on hold due to low BP.  Blood pressure remains relatively stable.   Anemia of chronic disease with some evidence of low B12 and borderline iron deficiency/acute blood loss anemia due to hematuria Continue B12 supplementation and iron.   -Transfused 2 units of PRBC this admission -Given IV iron, hemoglobin stable in the 7.5-8 range  Chronic Eliquis use This was started for DVT prophylaxis at the rehab.  Has been discontinued.  Pt was later placed on heparin which was stopped due to hematuria.   Palliative care encounter -Overall poor prognosis, status post palliative care meeting, plan for full code full scope of treatment per daughter  DVT  prophylaxis: SCD's Code Status: Full Family Communication: No family at bedside, updated niece 12/8, updated daughter [name] 12/9, poor prognosis reiterated  Status is: Inpatient  Remains inpatient appropriate because:Unsafe d/c plan and Inpatient level of care appropriate due to severity of illness, persistent hematuria  and now aspiration pneumonia   Dispo: The patient is from: SNF              Anticipated d/c is to: SNF              Anticipated d/c date is: Likely Monday or Tuesday if remains stable              Patient currently is not medically stable to d/c.   Consultants:   Urology  Procedures:     Antimicrobials: Anti-infectives (From admission, onward)   Start     Dose/Rate Route Frequency Ordered Stop   05/31/20 1400  ceFEPIme (MAXIPIME) 2 g in sodium chloride 0.9 % 100 mL IVPB        2 g 200 mL/hr over 30 Minutes Intravenous Every 8 hours 05/31/20 1240     05/24/20 1415  cefTRIAXone (ROCEPHIN) 1 g in sodium chloride 0.9 % 100 mL IVPB        1 g 200 mL/hr over 30 Minutes Intravenous Every 24 hours 05/24/20 1330 05/31/20 1110   05/23/20 1400  ceFEPIme (MAXIPIME) 2 g in sodium chloride 0.9 % 100 mL IVPB  Status:  Discontinued        2 g 200 mL/hr over 30 Minutes Intravenous Every 8 hours 05/23/20 1011 05/24/20 1330   05/22/20 0500  ceFEPIme (MAXIPIME) 2 g in sodium chloride 0.9 % 100 mL IVPB  Status:  Discontinued        2 g 200 mL/hr over 30 Minutes Intravenous Every 12 hours 05/21/20 1614 05/23/20 1011   05/21/20 2015  aztreonam (AZACTAM) 2 g in sodium chloride 0.9 % 100 mL IVPB        2 g 200 mL/hr over 30 Minutes Intravenous  Once 05/21/20 2010 05/21/20 2230   05/21/20 1730  vancomycin (VANCOREADY) IVPB 1750 mg/350 mL        1,750 mg 175 mL/hr over 120 Minutes Intravenous  Once 05/21/20 1722 05/21/20 2148   05/21/20 1545  ceFEPIme (MAXIPIME) 2 g in sodium chloride 0.9 % 100 mL IVPB        2 g 200 mL/hr over 30 Minutes Intravenous  Once 05/21/20 1535 05/21/20  1704      Subjective: -No events overnight, resting comfortably, drinking liquid supplements  Objective: Vitals:   06/03/20 0828 06/03/20 1403 06/03/20 2136 06/04/20 0624  BP: (!) 111/54 105/60 106/60 107/60  Pulse: (!) 102 (!) 103 95 96  Resp: 16 16 18 18   Temp:   98.7 F (37.1 C) 98.1 F (36.7 C)  TempSrc:   Oral Oral  SpO2: 93% 95% 95% 97%  Weight:      Height:        Intake/Output Summary (Last 24 hours) at 06/04/2020 1128 Last data filed at 06/04/2020 0800 Gross per 24 hour  Intake 444.52 ml  Output 1400 ml  Net -955.48 ml   Filed Weights   05/22/20 0045 05/22/20 1115 06/02/20 0500  Weight: 120.7 kg 120.7 kg 106.6 kg    Examination: General exam: Chronically ill elderly male laying in bed, awake alert oriented to self and place HEENT: Cervical hard collar on CVS: S1-S2, regular rate rhythm Lungs: Distant breath sounds Abdomen: Soft, nontender, bowel sounds present Extremities: 1+ edema  Skin: Unstageable sacral decubitus ulcer Neuro: Quadriplegic  Psychiatry: Flat affect  Data Reviewed: I have personally reviewed following labs and imaging studies  CBC: Recent Labs  Lab 05/31/20 0333 06/01/20 0234 06/02/20 0131 06/03/20 0101 06/04/20 0206  WBC 7.5  7.5 7.2 5.5 4.9  HGB 8.2* 8.0* 7.8* 7.8* 7.7*  HCT 25.2* 24.1* 24.8* 24.4* 23.0*  MCV 92.3 92.7 92.9 91.4 90.6  PLT 133* 122* 109* 119* 109*   Basic Metabolic Panel: Recent Labs  Lab 05/31/20 0333 06/01/20 0234 06/02/20 0131 06/03/20 0101 06/04/20 0206  NA 138 138 138 136 135  K 3.8 3.9 3.6 3.5 3.4*  CL 105 104 105 105 102  CO2 25 26 26 26 26   GLUCOSE 115* 107* 110* 108* 97  BUN 7* 11 10 7* 9  CREATININE 0.67 0.77 0.67 0.58* 0.54*  CALCIUM 7.4* 7.2* 7.0* 7.3* 7.3*   GFR: Estimated Creatinine Clearance: 102.2 mL/min (A) (by C-G formula based on SCr of 0.54 mg/dL (L)). Liver Function Tests: Recent Labs  Lab 06/02/20 0131  AST 63*  ALT 34  ALKPHOS 75  BILITOT 0.5  PROT 4.4*  ALBUMIN  1.4*   No results for input(s): LIPASE, AMYLASE in the last 168 hours. No results for input(s): AMMONIA in the last 168 hours. Coagulation Profile: No results for input(s): INR, PROTIME in the last 168 hours. Cardiac Enzymes: No results for input(s): CKTOTAL, CKMB, CKMBINDEX, TROPONINI in the last 168 hours. BNP (last 3 results) No results for input(s): PROBNP in the last 8760 hours. HbA1C: No results for input(s): HGBA1C in the last 72 hours. CBG: Recent Labs  Lab 05/31/20 0025 05/31/20 0749 05/31/20 1229  GLUCAP 113* 102* 139*   Lipid Profile: No results for input(s): CHOL, HDL, LDLCALC, TRIG, CHOLHDL, LDLDIRECT in the last 72 hours. Thyroid Function Tests: No results for input(s): TSH, T4TOTAL, FREET4, T3FREE, THYROIDAB in the last 72 hours. Anemia Panel: No results for input(s): VITAMINB12, FOLATE, FERRITIN, TIBC, IRON, RETICCTPCT in the last 72 hours. Sepsis Labs: Recent Labs  Lab 06/01/20 1251  LATICACIDVEN 1.4    Recent Results (from the past 240 hour(s))  Culture, blood (routine x 2)     Status: None (Preliminary result)   Collection Time: 05/30/20  4:29 PM   Specimen: BLOOD LEFT HAND  Result Value Ref Range Status   Specimen Description BLOOD LEFT HAND  Final   Special Requests   Final    BOTTLES DRAWN AEROBIC AND ANAEROBIC Blood Culture results may not be optimal due to an inadequate volume of blood received in culture bottles   Culture   Final    NO GROWTH 4 DAYS Performed at [facility], [address], [place], [place]    Report Status PENDING  Incomplete  Culture, blood (routine x 2)     Status: None (Preliminary result)   Collection Time: 05/30/20  4:32 PM   Specimen: BLOOD LEFT ARM  Result Value Ref Range Status   Specimen Description BLOOD LEFT ARM  Final   Special Requests   Final    BOTTLES DRAWN AEROBIC ONLY Blood Culture results may not be optimal due to an inadequate volume of blood received in culture bottles   Culture    Final    NO GROWTH 4 DAYS Performed at [facility], [address] [place], [place]    Report Status PENDING  Incomplete  Culture, Urine     Status: Abnormal   Collection Time: 05/30/20  6:03 PM   Specimen: Urine, Random  Result Value Ref Range Status   Specimen Description URINE, RANDOM  Final   Special Requests   Final    NONE Performed at [facility], [address], [place], [place]  Culture 20,000 COLONIES/mL ACHROMOBACTER XYLOSOXIDANS (A)  Final   Report Status 06/02/2020 FINAL  Final   Organism ID, Bacteria ACHROMOBACTER XYLOSOXIDANS (A)  Final      Susceptibility   Achromobacter xylosoxidans - MIC*    CEFAZOLIN >=64 RESISTANT Resistant     GENTAMICIN >=16 RESISTANT Resistant     CIPROFLOXACIN >=4 RESISTANT Resistant     IMIPENEM 8 INTERMEDIATE Intermediate     TRIMETH/SULFA <=20 SENSITIVE Sensitive     * 20,000 COLONIES/mL ACHROMOBACTER XYLOSOXIDANS     Radiology Studies: No results found.  Scheduled Meds: . Chlorhexidine Gluconate Cloth  6 each Topical Daily  . collagenase   Topical Daily  . docusate sodium  100 mg Oral BID  . feeding supplement  237 mL Oral TID BM  . ferrous sulfate  325 mg Oral BID WC  . midodrine  10 mg Oral BID WC  . oxybutynin  5 mg Oral QHS  . pantoprazole  40 mg Oral QHS  . tamsulosin  0.4 mg Oral Daily   Continuous Infusions: . ceFEPime (MAXIPIME) IV 2 g (06/04/20 0516)  . ferumoxytol 510 mg (06/02/20 2158)  . sodium chloride irrigation       LOS: 14 days   Zannie Cove, MD [facility]  06/04/2020, 11:28 AM

## 2020-06-04 NOTE — Progress Notes (Signed)
  Speech Language Pathology Treatment: Dysphagia  Patient Details Name: Shannon [name] MRN: [id number] DOB: 1945-09-14 Today's Date: 06/04/2020 Time: 6144-3154 SLP Time Calculation (min) (ACUTE ONLY): 22 min  Assessment / Plan / Recommendation Clinical Impression  SLP was contacted regarding pt's request for regular liquids/solids. Pt's RN was advised of plan for repeat modified barium swallow study to assess improvement in swallow function. Pt was seen subsequently for dysphagia treatment with his daughter present. Pt and his daughter were educated regarding the results of the modified barium swallow study on 11/11, the severity of his impairments and rationale for the recommended diet. Video recording of the study was used to facilitate education and both parties verbalized understanding. Oral care was provided by SLP and pt tolerated ice chips and limited boluses of thin liquids via tsp without overt s/sx of aspiration. Silent aspiration cannot be ruled out at bedside. Pt and his daughter are still in agreement with completion of a repeat modified barium swallow study to reassess physiology and determine safety of more advanced diet consistencies. Pt's daughter requested that she be contacted with any updates on her father between 1300-1400 so that she can devote the necessary attention to conversation. However, she stated that she can be reached at other times if it's an  "emergency emergency".    HPI HPI: Shannon [name] Smithis a 74 y.o.malewith medical history significant ofquadriplegia which follows surgery for Chiari I malformation with syringomyelia surgery in October 2021.  He originally had a C3-4 [place] 4-5 anterior cervical disc ACDF with decompression, C3 3-4 and C4-5 interbody arthrodesis with local autograft bone, anterior cervical plating of C3-C5 on 04/05/2020.  He then went home and had a fall from his bed, he was then hospitalized and from here he was sent to a nursing home, he has been  quadriplegic since then, he has also developed sacral decubitus ulcers. Was seen for MBS on 05/04/20 "deficits resulted in silent aspiration of thin liquid and nectar thick liquid by straw prior to the swallow. With nectar thick liquid by cup, only transient penetration was seen. There was significant pharyngeal residue with puree and solid consistencies which was reduced but not fully cleared with liquid wash." Now admitted for AMS due to UTI, sepsis and hypotension in a patient with indwelling Foley catheter POA and sacral decubitus ulcers POA who is quadriplegic. Pt/niece preferred to remain on nectar thick full liquids- pt stable and ST signed off. Re-ordered by hospitalist "suspicion for aspiration pna".        SLP Plan  MBS       Recommendations  Diet recommendations: Nectar-thick liquid Medication Administration: Crushed with puree Compensations: Slow rate;Small sips/bites;Follow solids with liquid                Oral Care Recommendations: Oral care BID Follow up Recommendations: Skilled Nursing facility SLP Visit Diagnosis: Dysphagia, oropharyngeal phase (R13.12) Plan: MBS       Shannon [name] I. Shannon Clock, MS, CCC-SLP Acute Rehabilitation Services Office number [phone] Pager [phone]                Shannon [name] 06/04/2020, 5:53 PM

## 2020-06-04 NOTE — Plan of Care (Signed)

## 2020-06-05 ENCOUNTER — Inpatient Hospital Stay (HOSPITAL_COMMUNITY): Payer: Medicare Other

## 2020-06-05 LAB — BASIC METABOLIC PANEL
Anion gap: 7 (ref 5–15)
BUN: 7 mg/dL — ABNORMAL LOW (ref 8–23)
CO2: 26 mmol/L (ref 22–32)
Calcium: 7.5 mg/dL — ABNORMAL LOW (ref 8.9–10.3)
Chloride: 102 mmol/L (ref 98–111)
Creatinine, Ser: 0.46 mg/dL — ABNORMAL LOW (ref 0.61–1.24)
GFR, Estimated: >60 mL/min (ref >60)
Glucose, Bld: 114 mg/dL — ABNORMAL HIGH (ref 70–99)
Potassium: 3.4 mmol/L — ABNORMAL LOW (ref 3.5–5.1)
Sodium: 135 mmol/L (ref 135–145)

## 2020-06-05 LAB — CBC
HCT: 23.6 % — ABNORMAL LOW (ref 39.0–52.0)
Hemoglobin: 7.7 g/dL — ABNORMAL LOW (ref 13.0–17.0)
MCH: 29.5 pg (ref 26.0–34.0)
MCHC: 32.6 g/dL (ref 30.0–36.0)
MCV: 90.4 fL (ref 80.0–100.0)
Platelets: 116 10*3/uL — ABNORMAL LOW (ref 150–400)
RBC: 2.61 MIL/uL — ABNORMAL LOW (ref 4.22–5.81)
RDW: 14.8 % (ref 11.5–15.5)
WBC: 5.1 10*3/uL (ref 4.0–10.5)
nRBC: 0.4 % — ABNORMAL HIGH (ref 0.0–0.2)

## 2020-06-05 MED ORDER — POTASSIUM CHLORIDE 20 MEQ PO PACK
40.0000 meq | PACK | Freq: Once | ORAL | Status: AC
  Administered 2020-06-05: 40 meq via ORAL
  Filled 2020-06-05: qty 2

## 2020-06-05 NOTE — Plan of Care (Signed)
  Problem: Education: Goal: Knowledge of General Education information will improve Description Including pain rating scale, medication(s)/side effects and non-pharmacologic comfort measures Outcome: Progressing   

## 2020-06-05 NOTE — Progress Notes (Signed)
Modified Barium Swallow Progress Note  Patient Details  Name: Shannon [name] MRN: [id number] Date of Birth: [birth year]  Today's Date: 06/05/2020  Modified Barium Swallow completed.  Full report located under Chart Review in the Imaging Section.  Brief recommendations include the following:  Clinical Impression  Pt demonstrates normal swallow ability. No penetration, aspiration or residue was observed. Pt at times had mild spillage of oral residue to pharynx which he sensed and swallowed, not outside of normal limits. He is able to masticate and esophageal sweep with solids appeared WNL. Will initiate a regular diet and thin liquids to allow foods of choice though pt will need total assist feeding. Will f/u for tolerance.   Swallow Evaluation Recommendations       SLP Diet Recommendations: Regular solids;Thin liquid   Liquid Administration via: Cup;Straw   Medication Administration: Whole meds with liquid   Supervision: Staff to assist with self feeding   Compensations: Slow rate;Small sips/bites;Follow solids with liquid       Oral Care Recommendations: Oral care BID       Harlon Ditty, MA CCC-SLP  Acute Rehabilitation Services Pager [phone] Office [phone]  Shylee Durrett, Riley Nearing 06/05/2020,1:53 PM

## 2020-06-05 NOTE — Progress Notes (Signed)
PROGRESS NOTE    Shannon [name]  OXB:[id number] DOB: Apr 16, 1946 DOA: 05/21/2020 PCP: [name], MD   Brief Narrative:  74 y.o.malewith medical history significant ofquadriplegia, Chiari I malformation with syringomyelia, ACDF in October 2021, residual quadriplegia, then had a fall at home developed epidural hematoma, underwent reexploration, removal of hardware, evacuation of hematoma on 10/30, now quadriplegic with dysphagia, unstageable sacral decubitus wounds, failure to thrive, recurrent hospitalizations -Subsequently hospitalized with aspiration pneumonia, then went to SNF -Readmitted 11/28 with septic shock, severe frank hematuria and UTI -Treated with antibiotics, continuous bladder irrigation -Also seen by palliative care this admission patient and daughter want full scope of treatment -Started on hydrotherapy for nonhealing decubitus wounds, general surgery did not recommend debridement -12/7: Developed worsening cough, fevers and tachycardia, started antibiotics for aspiration pneumonia 12/8-9: Overnight with hypotension  Assessment & Plan:    Septic shock  Acute metabolic encephalopathy due to UTI, sepsis and hypotension -Blood pressure improved initially, completed 10-day course of ceftriaxone 12/8 -12/8-9 Overnight with hypotension again, antibiotics changed to treat aspiration pneumonia, blood pressure improved -Midodrine added -Now stable -Discharge planning, back to SNF for rehabilitation, high risk of quick rehospitalization due to patient's extremely debilitated condition -Close palliative care follow-up after discharge  Frank hematuria -In the setting of indwelling Foley catheter/urethral injury and UTI -Seen by urology for persistent frank hematuria -Required CBI last week and part of this week, now discontinued -Hematuria has resolved -Will need Foley catheter at discharge, urology following as needed -Completed 10-day course of ceftriaxone on  12/8  Aspiration pneumonia Severe dysphagia -Developed fever and tachycardia on 12/7, productive cough -Chest x-ray with persistent lower lobe opacities -Started on IV cefepime for aspiration pneumonia, penicillin allergy noted, SLP following -felt to be at high risk of aspiration, remains on thickened liquids, oral intake is marginally better -Day 6 of antibiotics today, discontinue after 7-day course completed -Plan for repeat modified barium swallow noted, this is pending  Cervical myelopathy, Quadriplegia Severe dysphagia -Had C-spine surgery on 10/13/ACDF -Subsequently had a fall at home developed epidural hematoma, underwent reexploration, removal of hardware, evacuation of hematoma on 10/30 -Remains quadriplegic with dysphagia, unstageable sacral decubitus wounds, failure to thrive, recurrent hospitalizations -Discussed with neurosurgery, recommended to leave cervical collar on for 3 months total, i.e. mid January -Palliative care consulted for goals of care, patient family wishes for full scope of treatment -Continues to be at high risk of ongoing decline, worsening wounds, recurrent aspiration pneumonia, this was explained to patient's daughter again   Chronic sacral decubitus ulcers POA Large sacral decubitus wound which is unstageable. -General surgery was consulted., no debridement needed, was getting hydrotherapy, cleanse choice vera flow wound VAC placed  Severe protein calorie malnutrition -With severe cachexia weight loss -Supplements encouraged, continue dysphagia/thickened liquid  Essential hypertension/hypokalemia Blood pressure medications were stopped in the setting of shock.  Blood pressure remains relatively stable now on midodrine  Anemia of chronic disease with some evidence of low B12 and borderline iron deficiency/acute blood loss anemia due to hematuria Continue B12 supplementation and iron.   -Transfused 2 units of PRBC this admission -Given IV iron,  hemoglobin stable in the 7.5-8 range  Chronic Eliquis use This was started for DVT prophylaxis at the rehab.  Has been discontinued.  Pt was later placed on heparin which was stopped due to hematuria.   Palliative care encounter -Overall poor prognosis, status post palliative care meeting, plan for full code full scope of treatment per daughter  DVT prophylaxis: SCD's Code  Status: Full Family Communication: No family at bedside, updated niece 12/8, updated daughter [name] 12/9, poor prognosis reiterated  Status is: Inpatient  Remains inpatient appropriate because:Unsafe d/c plan and Inpatient level of care appropriate due to severity of illness, persistent hematuria and now aspiration pneumonia   Dispo: The patient is from: SNF              Anticipated d/c is to: SNF              Anticipated d/c date is: 12/14 if stable              Patient currently is not medically stable to d/c.   Consultants:   Urology  Procedures:     Antimicrobials: Anti-infectives (From admission, onward)   Start     Dose/Rate Route Frequency Ordered Stop   05/31/20 1400  ceFEPIme (MAXIPIME) 2 g in sodium chloride 0.9 % 100 mL IVPB        2 g 200 mL/hr over 30 Minutes Intravenous Every 8 hours 05/31/20 1240 06/06/20 2359   05/24/20 1415  cefTRIAXone (ROCEPHIN) 1 g in sodium chloride 0.9 % 100 mL IVPB        1 g 200 mL/hr over 30 Minutes Intravenous Every 24 hours 05/24/20 1330 05/31/20 1110   05/23/20 1400  ceFEPIme (MAXIPIME) 2 g in sodium chloride 0.9 % 100 mL IVPB  Status:  Discontinued        2 g 200 mL/hr over 30 Minutes Intravenous Every 8 hours 05/23/20 1011 05/24/20 1330   05/22/20 0500  ceFEPIme (MAXIPIME) 2 g in sodium chloride 0.9 % 100 mL IVPB  Status:  Discontinued        2 g 200 mL/hr over 30 Minutes Intravenous Every 12 hours 05/21/20 1614 05/23/20 1011   05/21/20 2015  aztreonam (AZACTAM) 2 g in sodium chloride 0.9 % 100 mL IVPB        2 g 200 mL/hr over 30 Minutes Intravenous   Once 05/21/20 2010 05/21/20 2230   05/21/20 1730  vancomycin (VANCOREADY) IVPB 1750 mg/350 mL        1,750 mg 175 mL/hr over 120 Minutes Intravenous  Once 05/21/20 1722 05/21/20 2148   05/21/20 1545  ceFEPIme (MAXIPIME) 2 g in sodium chloride 0.9 % 100 mL IVPB        2 g 200 mL/hr over 30 Minutes Intravenous  Once 05/21/20 1535 05/21/20 1704      Subjective: -Feels okay, no events overnight tolerating liquids without symptoms of overt aspiration  Objective: Vitals:   06/04/20 0624 06/04/20 1420 06/04/20 2157 06/05/20 0545  BP: 107/60 103/63 (!) 99/52 102/61  Pulse: 96 90 100 90  Resp: 18 16 18 18   Temp: 98.1 F (36.7 C) 98.8 F (37.1 C) 98.7 F (37.1 C) 98.6 F (37 C)  TempSrc: Oral  Oral Oral  SpO2: 97% 95% 97% 98%  Weight:      Height:        Intake/Output Summary (Last 24 hours) at 06/05/2020 1207 Last data filed at 06/05/2020 06/07/2020 Gross per 24 hour  Intake 115 ml  Output 1750 ml  Net -1635 ml   Filed Weights   05/22/20 0045 05/22/20 1115 06/02/20 0500  Weight: 120.7 kg 120.7 kg 106.6 kg    Examination: General exam: Chronically ill elderly male laying in bed, awake alert oriented to self and place HEENT: Cervical hard collar on CVS: S1-S2, regular rate rhythm Lungs: Decreased breath sounds both bases Abdomen: Soft, nontender, bowel  sounds present Extremities: 1+ edema Skin: Unstageable sacral decubitus ulcer Neuro: Quadriplegic  Psychiatry: Flat affect  Data Reviewed: I have personally reviewed following labs and imaging studies  CBC: Recent Labs  Lab 06/01/20 0234 06/02/20 0131 06/03/20 0101 06/04/20 0206 06/05/20 0136  WBC 7.5 7.2 5.5 4.9 5.1  HGB 8.0* 7.8* 7.8* 7.7* 7.7*  HCT 24.1* 24.8* 24.4* 23.0* 23.6*  MCV 92.7 92.9 91.4 90.6 90.4  PLT 122* 109* 119* 109* 116*   Basic Metabolic Panel: Recent Labs  Lab 06/01/20 0234 06/02/20 0131 06/03/20 0101 06/04/20 0206 06/05/20 0136  NA 138 138 136 135 135  K 3.9 3.6 3.5 3.4* 3.4*  CL 104  105 105 102 102  CO2 26 26 26 26 26   GLUCOSE 107* 110* 108* 97 114*  BUN 11 10 7* 9 7*  CREATININE 0.77 0.67 0.58* 0.54* 0.46*  CALCIUM 7.2* 7.0* 7.3* 7.3* 7.5*   GFR: Estimated Creatinine Clearance: 102.2 mL/min (A) (by C-G formula based on SCr of 0.46 mg/dL (L)). Liver Function Tests: Recent Labs  Lab 06/02/20 0131  AST 63*  ALT 34  ALKPHOS 75  BILITOT 0.5  PROT 4.4*  ALBUMIN 1.4*   No results for input(s): LIPASE, AMYLASE in the last 168 hours. No results for input(s): AMMONIA in the last 168 hours. Coagulation Profile: No results for input(s): INR, PROTIME in the last 168 hours. Cardiac Enzymes: No results for input(s): CKTOTAL, CKMB, CKMBINDEX, TROPONINI in the last 168 hours. BNP (last 3 results) No results for input(s): PROBNP in the last 8760 hours. HbA1C: No results for input(s): HGBA1C in the last 72 hours. CBG: Recent Labs  Lab 05/31/20 0025 05/31/20 0749 05/31/20 1229  GLUCAP 113* 102* 139*   Lipid Profile: No results for input(s): CHOL, HDL, LDLCALC, TRIG, CHOLHDL, LDLDIRECT in the last 72 hours. Thyroid Function Tests: No results for input(s): TSH, T4TOTAL, FREET4, T3FREE, THYROIDAB in the last 72 hours. Anemia Panel: No results for input(s): VITAMINB12, FOLATE, FERRITIN, TIBC, IRON, RETICCTPCT in the last 72 hours. Sepsis Labs: Recent Labs  Lab 06/01/20 1251  LATICACIDVEN 1.4    Recent Results (from the past 240 hour(s))  Culture, blood (routine x 2)     Status: None   Collection Time: 05/30/20  4:29 PM   Specimen: BLOOD LEFT HAND  Result Value Ref Range Status   Specimen Description BLOOD LEFT HAND  Final   Special Requests   Final    BOTTLES DRAWN AEROBIC AND ANAEROBIC Blood Culture results may not be optimal due to an inadequate volume of blood received in culture bottles   Culture   Final    NO GROWTH 5 DAYS Performed at [facility], [address] [place], [place] [address]    Report Status 06/04/2020 FINAL  Final  Culture,  blood (routine x 2)     Status: None   Collection Time: 05/30/20  4:32 PM   Specimen: BLOOD LEFT ARM  Result Value Ref Range Status   Specimen Description BLOOD LEFT ARM  Final   Special Requests   Final    BOTTLES DRAWN AEROBIC ONLY Blood Culture results may not be optimal due to an inadequate volume of blood received in culture bottles   Culture   Final    NO GROWTH 5 DAYS Performed at [facility], [address] [place], [place] [address]    Report Status 06/04/2020 FINAL  Final  Culture, Urine     Status: Abnormal   Collection Time: 05/30/20  6:03 PM   Specimen: Urine, Random  Result Value Ref Range Status   Specimen Description URINE, RANDOM  Final   Special Requests   Final    NONE Performed at [facility], [address], [place], [place] [address]    Culture 20,000 COLONIES/mL ACHROMOBACTER XYLOSOXIDANS (A)  Final   Report Status 06/02/2020 FINAL  Final   Organism ID, Bacteria ACHROMOBACTER XYLOSOXIDANS (A)  Final      Susceptibility   Achromobacter xylosoxidans - MIC*    CEFAZOLIN >=64 RESISTANT Resistant     GENTAMICIN >=16 RESISTANT Resistant     CIPROFLOXACIN >=4 RESISTANT Resistant     IMIPENEM 8 INTERMEDIATE Intermediate     TRIMETH/SULFA <=20 SENSITIVE Sensitive     * 20,000 COLONIES/mL ACHROMOBACTER XYLOSOXIDANS     Radiology Studies: No results found.  Scheduled Meds: . Chlorhexidine Gluconate Cloth  6 each Topical Daily  . collagenase   Topical Daily  . docusate sodium  100 mg Oral BID  . feeding supplement  237 mL Oral TID BM  . ferrous sulfate  325 mg Oral BID WC  . midodrine  10 mg Oral BID WC  . oxybutynin  5 mg Oral QHS  . pantoprazole  40 mg Oral QHS  . tamsulosin  0.4 mg Oral Daily   Continuous Infusions: . ceFEPime (MAXIPIME) IV 2 g (06/05/20 0542)  . ferumoxytol 510 mg (06/02/20 2158)  . sodium chloride irrigation       LOS: 15 days   Zannie Cove, MD [facility]  06/05/2020, 12:07 PM

## 2020-06-05 NOTE — Progress Notes (Addendum)
Physical Therapy Wound Treatment Patient Details  Name: Shannon [name] MRN: [id number] Date of Birth: Sep 17, 1945  Today's Date: 06/05/2020 Time: 1010-1120 Time Calculation (min): 70 min  Subjective  Subjective: Agreeable to hydrotherapy - very painful and irritable this session. Patient and Family Stated Goals: "Be done with this." Prior Treatments: Dressing changes  Pain Score: Pt endorses significant pain with minimal movement and minimal touching of the wound.   Wound Assessment  Pressure Injury 05/23/20 Sacrum Unstageable - Full thickness tissue loss in which the base of the injury is covered by slough (yellow, tan, gray, green or brown) and/or eschar (tan, brown or black) in the wound bed. (Active)  Dressing Type ABD;Barrier Film (skin prep);Gauze (Comment);Moist to dry; Non-adherent 06/05/20 1133  Dressing Changed;Clean;Dry;Intact 06/05/20 1133  Dressing Change Frequency Daily 06/05/20 1133  State of Healing Early/partial granulation 06/05/20 1133  Site / Wound Assessment Red;Brown;Yellow 06/05/20 1133  % Wound base Red or Granulating 40% 06/05/20 1133  % Wound base Yellow/Fibrinous Exudate 40% 06/05/20 1133  % Wound base Black/Eschar 20% 06/05/20 1133  % Wound base Other/Granulation Tissue (Comment) 0% 06/05/20 1133  Peri-wound Assessment Intact;Pink 06/05/20 1133  Wound Length (cm) 11 cm 05/30/20 1206  Wound Width (cm) 9.5 cm 05/30/20 1206  Wound Depth (cm) 3.2 cm 05/30/20 1206  Wound Surface Area (cm^2) 104.5 cm^2 05/30/20 1206  Wound Volume (cm^3) 334.4 cm^3 05/30/20 1206  Margins Unattached edges (unapproximated) 06/05/20 1133  Drainage Amount Minimal 06/05/20 1133  Drainage Description Serosanguineous 06/05/20 1133  Treatment Debridement (Selective);Packing (Saline gauze) 06/05/20 1133   Santyl applied to wound bed prior to applying dressing.     Selective Debridement Selective Debridement - Location: Sacrum Selective Debridement - Tools Used:  Forceps;Scissors Selective Debridement - Tissue Removed: yellow slough, brown unviable tissue   Wound Assessment and Plan  Wound Therapy - Assess/Plan/Recommendations Wound Therapy - Clinical Statement: VAC removed by nursing staff prior to hydrotherapy. Noted an area of discoloration at 3:00 at wound border and periwound area. Opted to return to wet>dry dressings with Santyl and monitor area of discoloration for improvement prior to attempting the [facility] again. Pt made several comments regarding being done with hydrotherapy and dressing changes - "Can't we just close it down? Can't we just be done with all this?". Encouraged pt to discuss with medical team if he wishes to stop wound care altogether. Will continue to follow and progress as able per POC. Wound Therapy - Functional Problem List: Decreased mobility and global weakness Factors Delaying/Impairing Wound Healing: Immobility Hydrotherapy Plan: Debridement;Dressing change;Patient/family education;Pulsatile lavage with suction Wound Therapy - Frequency: 6X / week Wound Therapy - Follow Up Recommendations: Skilled nursing facility Wound Plan: See above  Wound Therapy Goals- Improve the function of patient's integumentary system by progressing the wound(s) through the phases of wound healing (inflammation - proliferation - remodeling) by: Decrease Necrotic Tissue to: 20% Decrease Necrotic Tissue - Progress: Progressing toward goal Increase Granulation Tissue to: 80% Increase Granulation Tissue - Progress: Progressing toward goal Goals/treatment plan/discharge plan were made with and agreed upon by patient/family: Yes Time For Goal Achievement: 7 days Wound Therapy - Potential for Goals: Fair  Goals will be updated until maximal potential achieved or discharge criteria met.  Discharge criteria: when goals achieved, discharge from hospital, MD decision/surgical intervention, no progress towards goals, refusal/missing three consecutive  treatments without notification or medical reason.  GP     Thelma Comp 06/05/2020, 11:48 AM   Rolinda Roan, PT, DPT Acute Rehabilitation Services Pager: [phone] Office:  [phone]

## 2020-06-05 NOTE — Consult Note (Signed)
WOC Nurse Consult Note: Patient receiving care in [facility] 2W06 Reason for Consult: Wound on neck Wound type: MDRPI from C-Collar Pressure Injury POA: No Wound bed: Dry, flaky friction from C-Collar Drainage (amount, consistency, odor) None Dressing procedure/placement/frequency: Continue foam dressing for protection from C-Collar. Peel down all sites with a foam dressing EACH shift.  Record your observations.  Change foam dressing every 3 days or PRN soiling.  Notify the physician team if the area worsens  Monitor the wound area(s) for worsening of condition such as: Signs/symptoms of infection,  Increase in size,  Development of or worsening of odor, Development of pain, or increased pain at the affected locations.   Notify the medical team if any of these develop.  Thank you for the consult. WOC will follow weekly. Please re-consult the WOC team if needed.  Renaldo Reel Katrinka Blazing, MSN, RN, CMSRN, Angus Seller, [facility] Wound Treatment Associate Office [phone]  Pager [phone]

## 2020-06-06 ENCOUNTER — Inpatient Hospital Stay (HOSPITAL_COMMUNITY): Payer: Medicare Other

## 2020-06-06 LAB — SARS CORONAVIRUS 2 BY RT PCR (HOSPITAL ORDER, PERFORMED IN ~~LOC~~ HOSPITAL LAB)
SARS Coronavirus 2: POSITIVE — AB
SARS Coronavirus 2: POSITIVE — AB

## 2020-06-06 IMAGING — DX DG CHEST 1V PORT
1 series · 1 of 1 positions shown · non-contrast
Comparison: [DATE] and prior

CLINICAL DATA: Hypoxia

EXAM:
PORTABLE CHEST 1 VIEW

[chest ap]
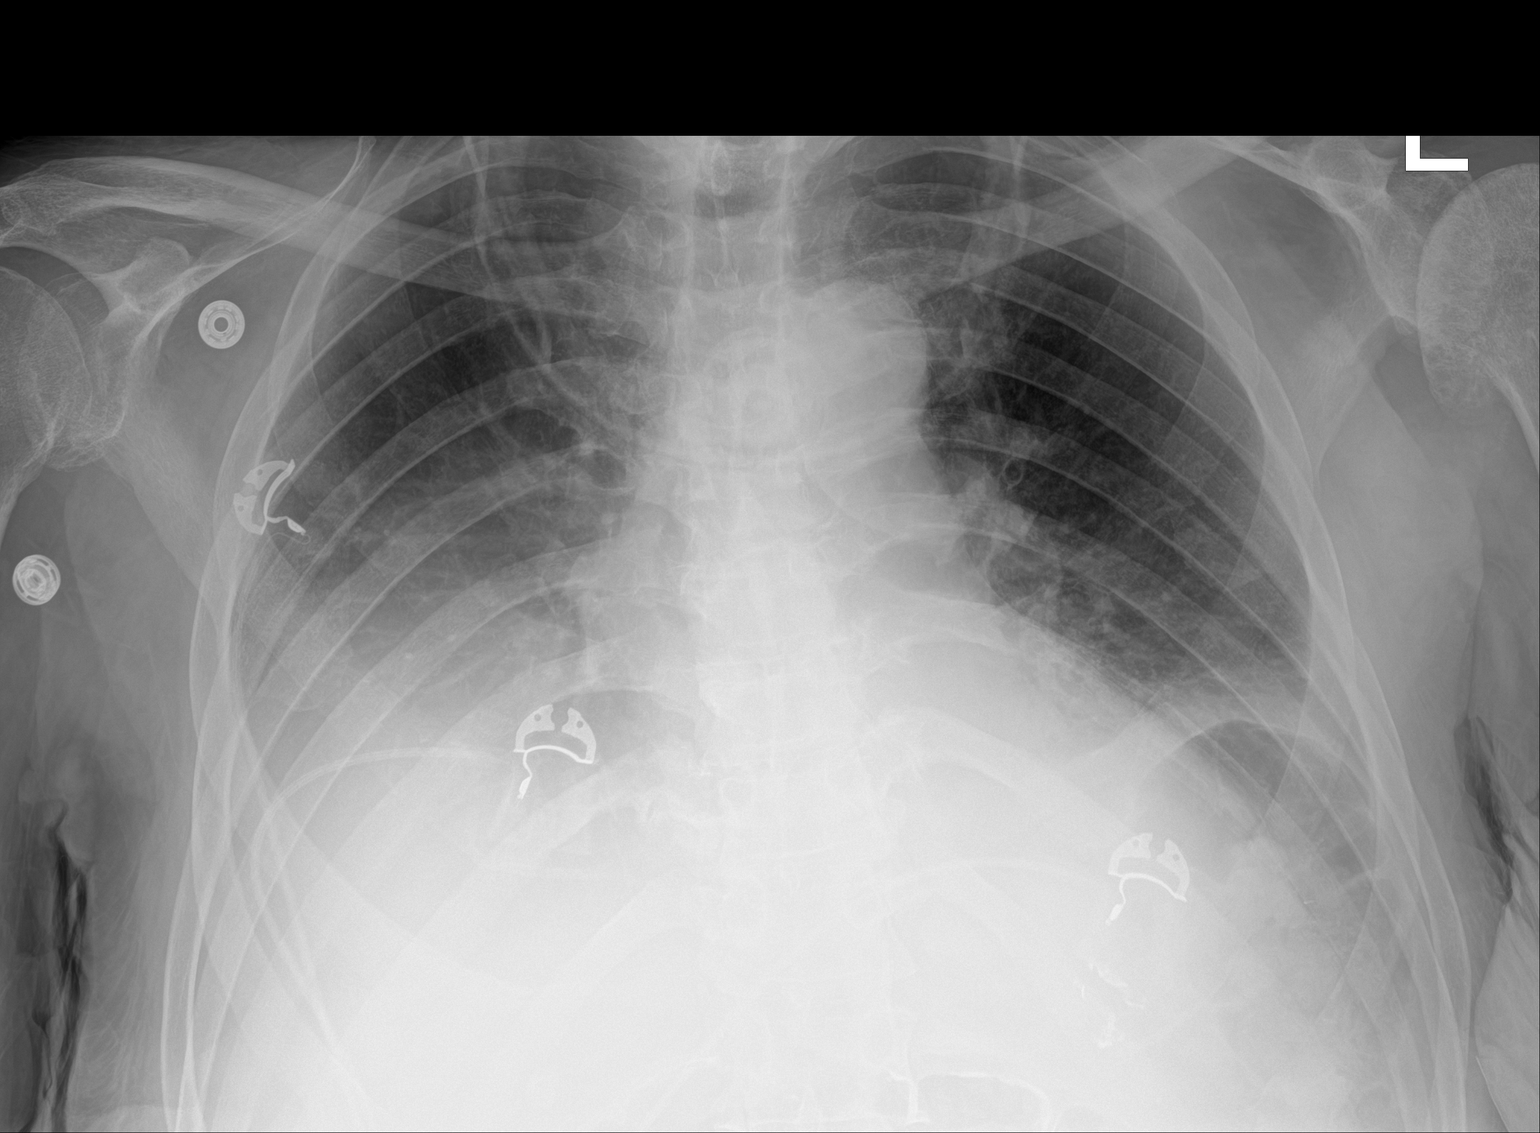

[1 of 1 positions shown; findings below may reference images not displayed]

FINDINGS: No pneumothorax. Patchy bibasilar opacities and small effusions,
unchanged. Stable cardiomediastinal silhouette and osseous
structures.
IMPRESSION: Bibasilar opacities and small effusions, unchanged.

## 2020-06-06 MED ORDER — SODIUM CHLORIDE 0.9 % IV SOLN
Freq: Once | INTRAVENOUS | Status: AC
  Filled 2020-06-06 (×2): qty 20

## 2020-06-06 MED ORDER — OXYCODONE HCL 5 MG PO TABS: 5.0000 mg | ORAL_TABLET | Freq: Three times a day (TID) | ORAL | 0 refills | Status: DC | PRN

## 2020-06-06 MED ORDER — ACETAMINOPHEN 500 MG PO TABS
1000.0000 mg | ORAL_TABLET | Freq: Three times a day (TID) | ORAL | Status: DC | PRN
  Administered 2020-06-06 – 2020-06-12 (×4): 1000 mg via ORAL
  Filled 2020-06-06 (×5): qty 2

## 2020-06-06 MED ORDER — FAMOTIDINE IN NACL 20-0.9 MG/50ML-% IV SOLN
20.0000 mg | Freq: Once | INTRAVENOUS | Status: DC | PRN
  Filled 2020-06-06: qty 50

## 2020-06-06 MED ORDER — DIPHENHYDRAMINE HCL 50 MG/ML IJ SOLN: 50.0000 mg | Freq: Once | INTRAMUSCULAR | Status: DC | PRN

## 2020-06-06 MED ORDER — MIDODRINE HCL 10 MG PO TABS
10.0000 mg | ORAL_TABLET | Freq: Two times a day (BID) | ORAL | Status: DC
Start: 2020-06-06 — End: 2020-11-18

## 2020-06-06 MED ORDER — METHYLPREDNISOLONE SODIUM SUCC 125 MG IJ SOLR: 125.0000 mg | Freq: Once | INTRAMUSCULAR | Status: DC | PRN

## 2020-06-06 MED ORDER — SODIUM CHLORIDE 0.9 % IV SOLN
2.0000 g | Freq: Three times a day (TID) | INTRAVENOUS | Status: AC
  Administered 2020-06-06 – 2020-06-07 (×3): 2 g via INTRAVENOUS
  Filled 2020-06-06 (×5): qty 2

## 2020-06-06 MED ORDER — FERROUS SULFATE 325 (65 FE) MG PO TABS
325.0000 mg | ORAL_TABLET | Freq: Two times a day (BID) | ORAL | Status: DC
Start: 2020-06-06 — End: 2020-11-12

## 2020-06-06 MED ORDER — EPINEPHRINE 0.3 MG/0.3ML IJ SOAJ
0.3000 mg | Freq: Once | INTRAMUSCULAR | Status: DC | PRN
  Filled 2020-06-06: qty 0.6

## 2020-06-06 MED ORDER — ALBUTEROL SULFATE HFA 108 (90 BASE) MCG/ACT IN AERS
2.0000 | INHALATION_SPRAY | Freq: Once | RESPIRATORY_TRACT | Status: DC | PRN
  Filled 2020-06-06: qty 6.7

## 2020-06-06 MED ORDER — SODIUM CHLORIDE 0.9 % IV SOLN: INTRAVENOUS | Status: DC | PRN

## 2020-06-06 NOTE — Progress Notes (Signed)
TX pt to 5 west bed 1. RN Shanda Bumps received.   Called pt daughter [name] and left voicemail.   Spoke with pt daughter and updated her on location of her father on 5W bed 1. I asked if she wanted the number she stated no.

## 2020-06-06 NOTE — Care Management Important Message (Signed)
Important Message  Patient Details  Name: Shannon [name] MRN: [id number] Date of Birth: [birth year]   Medicare Important Message Given:  Yes     Oralia Rud Mitul Hallowell 06/06/2020, 12:34 PM

## 2020-06-06 NOTE — Progress Notes (Signed)
PT Cancellation Note  Patient Details Name: Shannon [name] MRN: [id number] DOB: 15-Oct-1945   Cancelled Treatment:    Reason Eval/Treat Not Completed: Pain limiting ability to participate. Pt declined hydrotherapy today. "Please don't put me in any more pain. I hurt all the time." Asked pt if he wanted to continue with wound care and he said no. I asked if he wanted me to let his doctors know he does not want any wound care and he said yes. At the end of session pt states "Why can't I just die?"   RN and MD notified.   Marylynn Pearson 06/06/2020, 1:14 PM

## 2020-06-06 NOTE — Progress Notes (Signed)
Patient cries out in pain and c/o generalized soreness during ADL's and turning.  Pain med given per order.

## 2020-06-06 NOTE — Progress Notes (Signed)
Pt familywas called and informed about covid results and room change. Nurse spoke with daughter Zaydan Papesh @ [phone]. And report was giving to 5W at shift change.

## 2020-06-06 NOTE — Discharge Summary (Addendum)
Physician Discharge Summary  Shannon [name] QMV:[id number] DOB: 12/29/1945 DOA: 05/21/2020  PCP: Shannon Massed, MD  Admit date: 05/21/2020 Discharge date: 06/06/2020  Time spent: 35 minutes  Recommendations for Outpatient Follow-up:  1. Routine Foley catheter care, change Foley catheter in 2 weeks 2.  Wound care: Apply Santyl to sacral wound daily then cover with moist gauze packing and ABD pad and tape 3. Needs palliative care follow-up at SNF, ongoing goals of care and CODE STATUS discussions 4. High risk of rehospitalization   Discharge Diagnoses:  Quadriplegia Unstageable sacral decubitus wounds Neurogenic bladder Chronic Foley catheter Dysphagia Septic shock Hematuria Aspiration pneumonia   Sepsis secondary to UTI (HCC) Iron deficiency anemia   Benign essential HTN   Quadriplegia and quadriparesis (HCC)   Essential hypertension   Myelopathy (HCC)   Chronic indwelling Foley catheter   Pressure injury of skin   Palliative care by specialist   Goals of care, counseling/discussion   Discharge Condition: Stable  Diet recommendation: Regular solids;Thin liquid   Liquid Administration via: Cup;Straw   Medication Administration: Whole meds with liquid   Supervision: Staff to assist with self feeding   Compensations: Slow rate;Small sips/bites;Follow solids with liquid  Filed Weights   05/22/20 0045 05/22/20 1115 06/02/20 0500  Weight: 120.7 kg 120.7 kg 106.6 kg    History of present illness:  74 y.o.malewith medical history significant ofquadriplegia, Chiari I malformation with syringomyelia, ACDF in October 2021, residual quadriplegia, then had a fall at home developed epidural hematoma, underwent reexploration, removal of hardware, evacuation of hematoma on 10/30, now quadriplegic with dysphagia, unstageable sacral decubitus wounds, failure to thrive, recurrent hospitalizations -Subsequently hospitalized with aspiration pneumonia, then went to  SNF -Readmitted 11/28 with septic shock, severe frank hematuria and UTI -Treated with antibiotics, continuous bladder irrigation  Hospital Course:   Septic shock  Acute metabolic encephalopathy due to UTI, sepsis and hypotension -Blood pressure improved initially, completed 10-day course of ceftriaxone 12/8 -Subsequently became hypotensive again in the setting of aspiration pneumonia, completed antibiotics for this  -Blood pressure stabilized, Midodrine added -Discharge planning, back to SNF for rehabilitation, high risk of quick rehospitalization due to patient's extremely debilitated condition -Close palliative care follow-up after discharge  Frank hematuria -In the setting of indwelling Foley catheter/urethral injury and UTI -Seen by urology for persistent frank hematuria -Required CBI last week and part of this week, now discontinued -Hematuria has resolved -Will need Foley catheter at discharge, urology following as needed -Completed 10-day course of ceftriaxone on 12/8  Aspiration pneumonia Dysphagia -During this hospitalization also developed recurrent fever, tachycardia, productive cough, Chest x-ray noted persistent lower lobe opacities -Started on IV cefepime for aspiration pneumonia, penicillin allergy noted, SLP following, noted to be at risk of aspiration but overall improving -Completed antibiotic course of cefepime, stable now -SLP to reassess his swallowing yesterday and recommended regular diet with thin liquids, staff to assist with feeding, slow rate, small sips/bites and follow solids with liquids  Cervical myelopathy, Quadriplegia Severe dysphagia -Had C-spine surgery on 10/13/[facility] -Subsequently had a fall at home developed epidural hematoma, underwent reexploration, removal of hardware, evacuation of hematoma on 10/30 -Remains quadriplegic with dysphagia, unstageable sacral decubitus wounds, failure to thrive, recurrent hospitalizations -Discussed with  neurosurgery, recommended to leave cervical collar on for 3 months total, i.e. till mid January -Palliative care consulted for goals of care, patient and family wishes for full scope of treatment -Continues to be at high risk of ongoing decline, worsening wounds, recurrent aspiration pneumonia, this was explained  to patient's daughter on multiple occasions  Chronic sacral decubitus ulcers POA Large sacral decubitus wound which is unstageable. -General surgery was consulted., no debridement needed, was getting hydrotherapy, transitioned back to daily dressing changes  Severe protein calorie malnutrition -With severe cachexia weight loss -Supplements encouraged, now diet advanced  Essential hypertension/hypokalemia Blood pressure medications were stopped in the setting of shock.  -Blood pressure borderline low in the low 90s range, started on midodrine   Anemia of chronic disease with some evidence of low B12 and borderline iron deficiency/acute blood loss anemia due to hematuria Continue B12 supplementation and iron. -Transfused 2 units of PRBC this admission -Given IV iron, hemoglobin stable in the 7.5-8 range -Continue oral iron  Chronic Eliquis use This was started for DVT prophylaxis at the rehab. Has been discontinued. Pt was later placed on heparin which was stopped due to hematuria.   Palliative care encounter -Patient is quadriplegic with extensive decubitus wounds, neurogenic bladder with chronic Foley, severe malnutrition, dysphagia at risk of recurrent aspiration pneumonia but overall very poor prognosis, status post palliative care meeting, patient and daughter want full code and full scope of treatment -Needs palliative care follow-up at SNF   Discharge Exam: Vitals:   06/05/20 2117 06/06/20 0557  BP: 105/65 93/60  Pulse: 96 77  Resp: 18 18  Temp: 98.2 F (36.8 C) 98.6 F (37 C)  SpO2: 98% 97%    General: AAOx3, chronically ill and  debilitated Cardiovascular: S1S2/RRR Respiratory: Decreased breath sounds the bases  Discharge Instructions   Discharge Instructions    Discharge wound care:   Complete by: As directed    Apply Santyl to sacral wound daily then cover with moist gauze packing and ABD pad and tape     Allergies as of 06/06/2020      Reactions   Iodine Swelling   Penicillins Swelling    tolerates cephalosporins Facial swelling, itchy throat      Medication List    STOP taking these medications   food thickener Powd Commonly known as: SIMPLYTHICK   guaiFENesin 600 MG 12 hr tablet Commonly known as: MUCINEX   ibuprofen 600 MG tablet Commonly known as: ADVIL     TAKE these medications   acetaminophen 500 MG tablet Commonly known as: TYLENOL Take 1,000 mg by mouth 3 (three) times daily.   albuterol 108 (90 Base) MCG/ACT inhaler Commonly known as: VENTOLIN HFA Inhale 2 puffs into the lungs every morning.   ascorbic acid 500 MG tablet Commonly known as: VITAMIN C Take 1,000 mg by mouth every morning.   aspirin 325 MG tablet Take 325 mg by mouth every morning.   ferrous sulfate 325 (65 FE) MG tablet Take 1 tablet (325 mg total) by mouth 2 (two) times daily with a meal.   midodrine 10 MG tablet Commonly known as: PROAMATINE Take 1 tablet (10 mg total) by mouth 2 (two) times daily with a meal.   oxyCODONE 5 MG immediate release tablet Commonly known as: Oxy IR/ROXICODONE Take 1 tablet (5 mg total) by mouth every 8 (eight) hours as needed for moderate pain or severe pain.   pantoprazole 40 MG tablet Commonly known as: PROTONIX Take 1 tablet (40 mg total) by mouth at bedtime.   polyethylene glycol 17 g packet Commonly known as: MIRALAX / GLYCOLAX Take 17 g by mouth every morning. Mix in 6 oz water and drink   tamsulosin 0.4 MG Caps capsule Commonly known as: FLOMAX Take 1 capsule (0.4 mg total) by mouth  daily.            Discharge Care Instructions  (From admission,  onward)         Start     Ordered   06/06/20 0000  Discharge wound care:       Comments: Apply Santyl to sacral wound daily then cover with moist gauze packing and ABD pad and tape   06/06/20 0918         Allergies  Allergen Reactions  . Iodine Swelling  . Penicillins Swelling    ** tolerates cephalosporins Facial swelling, itchy throat    Follow-up Information    McGowen, Maryjean Morn, MD. Schedule an appointment as soon as possible for a visit in 1 week(s).   Specialty: Family Medicine Contact information: [address] [place] [address] [phone]        Palliative care Follow up in 2 week(s).                The results of significant diagnostics from this hospitalization (including imaging, microbiology, ancillary and laboratory) are listed below for reference.    Significant Diagnostic Studies: DG Chest 2 View  Result Date: 05/31/2020 CLINICAL DATA:  Fever, hypertension, former smoker EXAM: CHEST - 2 VIEW COMPARISON:  05/30/2020 FINDINGS: Normal heart size, mediastinal contours, and pulmonary vascularity. RIGHT greater than LEFT basilar opacities question pneumonia versus atelectasis little changed. Small bibasilar effusions larger on RIGHT. No pneumothorax. Degenerative changes of LEFT glenohumeral joint. IMPRESSION: Persistent bibasilar opacities question atelectasis versus pneumonia with small bibasilar effusions, both greater on RIGHT. Electronically Signed   By: Ulyses Southward M.D.   On: 05/31/2020 08:56   CT Head Wo Contrast  Result Date: 05/21/2020 CLINICAL DATA:  Mental status change. EXAM: CT HEAD WITHOUT CONTRAST TECHNIQUE: Contiguous axial images were obtained from the base of the skull through the vertex without intravenous contrast. COMPARISON:  May 03, 2020 FINDINGS: Brain: No evidence of acute infarction, hemorrhage, hydrocephalus, extra-axial collection or mass lesion/mass effect. Mild brain parenchymal volume loss and deep white matter  microangiopathy. Vascular: No hyperdense vessel or unexpected calcification. Skull: Normal. Negative for fracture or focal lesion. Sinuses/Orbits: No acute finding. Other: None. IMPRESSION: 1. No acute intracranial abnormality. 2. 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 Electronically Signed   By: Ted Mcalpine M.D.   On: 05/21/2020 17:53   DG CHEST PORT 1 VIEW  Result Date: 05/30/2020 CLINICAL DATA:  Fever chest pain EXAM: PORTABLE CHEST 1 VIEW COMPARISON:  05/21/2020, 05/08/2020, 05/06/2020 FINDINGS: Low lung volumes with elevation of right diaphragm. Similar right greater than left basilar consolidations. Possible small pleural effusions. Stable cardiomediastinal silhouette. No pneumothorax IMPRESSION: Similar appearance of right greater than left basilar consolidations, atelectasis versus pneumonia, with possible small pleural effusions. Electronically Signed   By: Jasmine Pang M.D.   On: 05/30/2020 19:16   DG Chest Port 1 View  Result Date: 05/21/2020 CLINICAL DATA:  Found unresponsive. EXAM: PORTABLE CHEST 1 VIEW COMPARISON:  May 08, 2020 FINDINGS: There is stable elevation of the right hemidiaphragm. Mild bilateral infrahilar atelectasis is seen. There is no evidence of a pleural effusion or pneumothorax. The heart size and mediastinal contours are within normal limits. The visualized skeletal structures are unremarkable. IMPRESSION: Mild bilateral infrahilar atelectasis. Electronically Signed   By: Aram Candela M.D.   On: 05/21/2020 16:34   DG Chest Port 1 View  Result Date: 05/08/2020 CLINICAL DATA:  Mucous plugging EXAM: PORTABLE CHEST 1 VIEW COMPARISON:  Two days ago  FINDINGS: Low volume chest with opacity at both lung bases. Air bronchograms seen on the left. No edema, effusion, or pneumothorax. Normal heart size IMPRESSION: Continued volume loss with extensive atelectasis at the bases. Electronically Signed   By: Marnee Spring M.D.   On:  05/08/2020 06:46   DG Swallowing Func-Speech Pathology  Result Date: 06/05/2020 Objective Swallowing Evaluation: Type of Study: MBS-Modified Barium Swallow Study  Patient Details Name: Andra Matsuo MRN: [id number] Date of Birth: [birth year] Today's Date: 06/05/2020 Time: SLP Start Time (ACUTE ONLY): 1300 -SLP Stop Time (ACUTE ONLY): 1316 SLP Time Calculation (min) (ACUTE ONLY): 16 min Past Medical History: Past Medical History: Diagnosis Date . Asthma  . 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 Newell Coral, 1992-->surg at [facility].  Summer 2021->Cervicalgia,arm pain, hand atrophy RUE wkness, hyperreflex (Dr. 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 Hay fever  . Hypertension  . Osteoarthritis of both hands  Past Surgical History: Past Surgical History: Procedure Laterality Date . ANTERIOR CERVICAL DECOMP/DISCECTOMY FUSION N/A 04/05/2020  Procedure: ANTERIOR CERVICAL DECOMPRESSION/DISCECTOMY FUSION, INTERBODY PROSTHESIS, PLATE/SCREWS CERVICAL THREE-CERVICAL FOUR, CERVICAL FOUR- CERVICAL FIVE;  Surgeon: Tressie Stalker, MD;  Location: [facility] OR;  Service: Neurosurgery;  Laterality: N/A; . ANTERIOR CERVICAL DECOMP/DISCECTOMY FUSION N/A 04/22/2020  Procedure: Reexploration of anterior cervical wound for epidural hematoma;  Surgeon: Donalee Citrin, MD;  Location: [facility] OR;  Service: Neurosurgery;  Laterality: N/A; . BACK SURGERY   . INCISION AND DRAINAGE Left 2012  L hand infection . ROTATOR CUFF REPAIR Right   x 3 . SPINE SURGERY   HPI: Eder Macek Smithis a 74 y.o.malewith medical history significant ofquadriplegia which follows surgery for Chiari I malformation with syringomyelia surgery in October 2021.  He originally had a C3-4 [place] 4-5 anterior cervical disc ACDF with decompression, C3 3-4 and C4-5 interbody arthrodesis with local autograft bone, anterior cervical plating of C3-C5 on  04/05/2020.  He then went home and had a fall from his bed, he was then hospitalized and from here he was sent to a nursing home, he has been quadriplegic since then, he has also developed sacral decubitus ulcers. Was seen for MBS on 05/04/20 "deficits resulted in silent aspiration of thin liquid and nectar thick liquid by straw prior to the swallow. With nectar thick liquid by cup, only transient penetration was seen. There was significant pharyngeal residue with puree and solid consistencies which was reduced but not fully cleared with liquid wash." Now admitted for AMS due to UTI, sepsis and hypotension in a patient with indwelling Foley catheter POA and sacral decubitus ulcers POA who is quadriplegic. Pt/niece preferred to remain on nectar thick full liquids- pt stable and ST signed off. Re-ordered by hospitalist "suspicion for aspiration pna".   Subjective: alert, cooperative, pleasant, participative. Assessment / Plan / Recommendation CHL IP CLINICAL IMPRESSIONS 06/05/2020 Clinical Impression Pt demonstrates normal swallow ability. No penetration, aspiration or residue was observed. Pt at times had mild spillage of oral residue to pharynx which he sensed and swallowed, not outside of normal limits. He is able to masticate and esophageal sweep with solids appeared WNL. Will initiate a regular diet and thin liquids to allow foods of choice though pt will need total assist feeding. Will f/u for tolerance. SLP Visit Diagnosis Dysphagia, unspecified (R13.10) Attention and concentration deficit following -- Frontal lobe and executive function deficit following -- Impact on safety and function Mild aspiration risk;Risk  for inadequate nutrition/hydration   CHL IP TREATMENT RECOMMENDATION 06/05/2020 Treatment Recommendations Therapy as outlined in treatment plan below   Prognosis 06/05/2020 Prognosis for Safe Diet Advancement Good Barriers to Reach Goals -- Barriers/Prognosis Comment -- CHL IP DIET RECOMMENDATION  06/05/2020 SLP Diet Recommendations Regular solids;Thin liquid Liquid Administration via Cup;Straw Medication Administration Whole meds with liquid Compensations Slow rate;Small sips/bites;Follow solids with liquid Postural Changes --   CHL IP OTHER RECOMMENDATIONS 06/05/2020 Recommended Consults -- Oral Care Recommendations Oral care BID Other Recommendations --   CHL IP FOLLOW UP RECOMMENDATIONS 06/05/2020 Follow up Recommendations Skilled Nursing facility   [facility] IP FREQUENCY AND DURATION 06/05/2020 Speech Therapy Frequency (ACUTE ONLY) min 2x/week Treatment Duration 2 weeks      CHL IP ORAL PHASE 06/05/2020 Oral Phase WFL Oral - Pudding Teaspoon -- Oral - Pudding Cup -- Oral - Honey Teaspoon -- Oral - Honey Cup -- Oral - Nectar Teaspoon -- Oral - Nectar Cup -- Oral - Nectar Straw -- Oral - Thin Teaspoon -- Oral - Thin Cup -- Oral - Thin Straw -- Oral - Puree -- Oral - Mech Soft -- Oral - Regular -- Oral - Multi-Consistency -- Oral - Pill -- Oral Phase - Comment --  CHL IP PHARYNGEAL PHASE 06/05/2020 Pharyngeal Phase WFL Pharyngeal- Pudding Teaspoon -- Pharyngeal -- Pharyngeal- Pudding Cup -- Pharyngeal -- Pharyngeal- Honey Teaspoon -- Pharyngeal -- Pharyngeal- Honey Cup -- Pharyngeal -- Pharyngeal- Nectar Teaspoon -- Pharyngeal -- Pharyngeal- Nectar Cup -- Pharyngeal -- Pharyngeal- Nectar Straw -- Pharyngeal -- Pharyngeal- Thin Teaspoon -- Pharyngeal -- Pharyngeal- Thin Cup -- Pharyngeal -- Pharyngeal- Thin Straw -- Pharyngeal -- Pharyngeal- Puree -- Pharyngeal -- Pharyngeal- Mechanical Soft -- Pharyngeal -- Pharyngeal- Regular -- Pharyngeal -- Pharyngeal- Multi-consistency -- Pharyngeal -- Pharyngeal- Pill -- Pharyngeal -- Pharyngeal Comment --  CHL IP CERVICAL ESOPHAGEAL PHASE 06/05/2020 Cervical Esophageal Phase -- Pudding Teaspoon -- Pudding Cup -- Honey Teaspoon -- Honey Cup -- Nectar Teaspoon -- Nectar Cup -- Nectar Straw -- Thin Teaspoon -- Thin Cup WFL Thin Straw WFL Puree WFL Mechanical Soft WFL Regular  -- Multi-consistency -- Pill WFL Cervical Esophageal Comment -- Harlon Ditty, MA CCC-SLP Acute Rehabilitation Services Pager [phone] Office [phone] Claudine Mouton 06/05/2020, 1:53 PM               Microbiology: Recent Results (from the past 240 hour(s))  Culture, blood (routine x 2)     Status: None   Collection Time: 05/30/20  4:29 PM   Specimen: BLOOD LEFT HAND  Result Value Ref Range Status   Specimen Description BLOOD LEFT HAND  Final   Special Requests   Final    BOTTLES DRAWN AEROBIC AND ANAEROBIC Blood Culture results may not be optimal due to an inadequate volume of blood received in culture bottles   Culture   Final    NO GROWTH 5 DAYS Performed at [facility], [address] [place], [place] [address]    Report Status 06/04/2020 FINAL  Final  Culture, blood (routine x 2)     Status: None   Collection Time: 05/30/20  4:32 PM   Specimen: BLOOD LEFT ARM  Result Value Ref Range Status   Specimen Description BLOOD LEFT ARM  Final   Special Requests   Final    BOTTLES DRAWN AEROBIC ONLY Blood Culture results may not be optimal due to an inadequate volume of blood received in culture bottles   Culture   Final    NO GROWTH 5 DAYS Performed  at [facility], [address] [place], [place] [address]    Report Status 06/04/2020 FINAL  Final  Culture, Urine     Status: Abnormal   Collection Time: 05/30/20  6:03 PM   Specimen: Urine, Random  Result Value Ref Range Status   Specimen Description URINE, RANDOM  Final   Special Requests   Final    NONE Performed at [facility], [address] [place], [place] [address]    Culture 20,000 COLONIES/mL ACHROMOBACTER XYLOSOXIDANS (A)  Final   Report Status 06/02/2020 FINAL  Final   Organism ID, Bacteria ACHROMOBACTER XYLOSOXIDANS (A)  Final      Susceptibility   Achromobacter xylosoxidans - MIC*    CEFAZOLIN >=64 RESISTANT Resistant     GENTAMICIN >=16 RESISTANT Resistant     CIPROFLOXACIN  >=4 RESISTANT Resistant     IMIPENEM 8 INTERMEDIATE Intermediate     TRIMETH/SULFA <=20 SENSITIVE Sensitive     * 20,000 COLONIES/mL ACHROMOBACTER XYLOSOXIDANS     Labs: Basic Metabolic Panel: Recent Labs  Lab 06/01/20 0234 06/02/20 0131 06/03/20 0101 06/04/20 0206 06/05/20 0136  NA 138 138 136 135 135  K 3.9 3.6 3.5 3.4* 3.4*  CL 104 105 105 102 102  CO2 26 26 26 26 26   GLUCOSE 107* 110* 108* 97 114*  BUN 11 10 7* 9 7*  CREATININE 0.77 0.67 0.58* 0.54* 0.46*  CALCIUM 7.2* 7.0* 7.3* 7.3* 7.5*   Liver Function Tests: Recent Labs  Lab 06/02/20 0131  AST 63*  ALT 34  ALKPHOS 75  BILITOT 0.5  PROT 4.4*  ALBUMIN 1.4*   No results for input(s): LIPASE, AMYLASE in the last 168 hours. No results for input(s): AMMONIA in the last 168 hours. CBC: Recent Labs  Lab 06/01/20 0234 06/02/20 0131 06/03/20 0101 06/04/20 0206 06/05/20 0136  WBC 7.5 7.2 5.5 4.9 5.1  HGB 8.0* 7.8* 7.8* 7.7* 7.7*  HCT 24.1* 24.8* 24.4* 23.0* 23.6*  MCV 92.7 92.9 91.4 90.6 90.4  PLT 122* 109* 119* 109* 116*   Cardiac Enzymes: No results for input(s): CKTOTAL, CKMB, CKMBINDEX, TROPONINI in the last 168 hours. BNP: BNP (last 3 results) Recent Labs    05/04/20 0001 05/23/20 0432 05/24/20 0127  BNP 345.1* 35.1 32.0    ProBNP (last 3 results) No results for input(s): PROBNP in the last 8760 hours.  CBG: Recent Labs  Lab 05/31/20 0025 05/31/20 0749 05/31/20 1229  GLUCAP 113* 102* 139*    Signed:  Zannie CovePreetha Jhoselyn Ruffini MD.  [facility] 06/06/2020, 10:17 AM

## 2020-06-06 NOTE — Progress Notes (Signed)
Returned call to pt daughter, Elvera Maria. Left VM

## 2020-06-06 NOTE — Progress Notes (Addendum)
DC Covid PCR for discharge to SNF is positive, pt is stable and asymptomatic but otherwise chronically ill. -will transfer to 5W -check CXR -will order Monoclonal Ab infusion -add am labs  Zannie Cove MD

## 2020-06-06 NOTE — Progress Notes (Signed)
  Speech Language Pathology Treatment: Dysphagia  Patient Details Name: Shannon [name] MRN: [id number] DOB: 05/12/1946 Today's Date: 06/06/2020 Time: 8280-0349 SLP Time Calculation (min) (ACUTE ONLY): 10 min  Assessment / Plan / Recommendation Clinical Impression  F/u after yesterday's MBS.  Pt demonstrated improvement from prior study November of this year, in fact, his dysphagia has resolved and study revealed no penetration, no aspiration, no residue.  Per f/u today, he had limited recall of yesterday's results.  We reviewed improvements. He demonstrated excellent toleration of liquids today and did not demonstrate signs of difficulty.    He will continue to require full assistance and careful hand-feeding. Continue regular solids, thin liquids to optimize his food choices.   There are no further acute SLP needs - our service will sign off.   HPI HPI: Shannon Whichard Smithis a 74 y.o.malewith medical history significant ofquadriplegia which follows surgery for Chiari I malformation with syringomyelia surgery in October 2021.  He originally had a C3-4 [place] 4-5 anterior cervical disc ACDF with decompression, C3 3-4 and C4-5 interbody arthrodesis with local autograft bone, anterior cervical plating of C3-C5 on 04/05/2020.  He then went home and had a fall from his bed, he was then hospitalized and from here he was sent to a nursing home, he has been quadriplegic since then, he has also developed sacral decubitus ulcers. Was seen for MBS on 05/04/20 "deficits resulted in silent aspiration of thin liquid and nectar thick liquid by straw prior to the swallow. With nectar thick liquid by cup, only transient penetration was seen. There was significant pharyngeal residue with puree and solid consistencies which was reduced but not fully cleared with liquid wash." Now admitted for AMS due to UTI, sepsis and hypotension in a patient with indwelling Foley catheter POA and sacral decubitus ulcers POA who is  quadriplegic. Pt/niece preferred to remain on nectar thick full liquids- pt stable and ST signed off. Re-ordered by hospitalist "suspicion for aspiration pna".        SLP Plan  All goals met       Recommendations  Diet recommendations: Regular;Thin liquid Liquids provided via: Cup;Straw Medication Administration: Whole meds with liquid Supervision: Trained caregiver to feed patient Postural Changes and/or Swallow Maneuvers: Seated upright 90 degrees                Oral Care Recommendations: Oral care BID Follow up Recommendations: Skilled Nursing facility SLP Visit Diagnosis: Dysphagia, unspecified (R13.10) Plan: All goals met       GO                Shannon [name] 06/06/2020, 1:25 PM  Shannon [name], [place] Office number [phone] Pager [phone]

## 2020-06-07 LAB — CBC
HCT: 24.3 % — ABNORMAL LOW (ref 39.0–52.0)
Hemoglobin: 8 g/dL — ABNORMAL LOW (ref 13.0–17.0)
MCH: 30.3 pg (ref 26.0–34.0)
MCHC: 32.9 g/dL (ref 30.0–36.0)
MCV: 92 fL (ref 80.0–100.0)
Platelets: 137 10*3/uL — ABNORMAL LOW (ref 150–400)
RBC: 2.64 MIL/uL — ABNORMAL LOW (ref 4.22–5.81)
RDW: 15.4 % (ref 11.5–15.5)
WBC: 6.2 10*3/uL (ref 4.0–10.5)
nRBC: 0 % (ref 0.0–0.2)

## 2020-06-07 LAB — COMPREHENSIVE METABOLIC PANEL
ALT: 47 U/L — ABNORMAL HIGH (ref 0–44)
AST: 70 U/L — ABNORMAL HIGH (ref 15–41)
Albumin: 1.3 g/dL — ABNORMAL LOW (ref 3.5–5.0)
Alkaline Phosphatase: 80 U/L (ref 38–126)
Anion gap: 5 (ref 5–15)
BUN: 6 mg/dL — ABNORMAL LOW (ref 8–23)
CO2: 28 mmol/L (ref 22–32)
Calcium: 7.6 mg/dL — ABNORMAL LOW (ref 8.9–10.3)
Chloride: 102 mmol/L (ref 98–111)
Creatinine, Ser: 0.53 mg/dL — ABNORMAL LOW (ref 0.61–1.24)
GFR, Estimated: >60 mL/min (ref >60)
Glucose, Bld: 96 mg/dL (ref 70–99)
Potassium: 3.2 mmol/L — ABNORMAL LOW (ref 3.5–5.1)
Sodium: 135 mmol/L (ref 135–145)
Total Bilirubin: 0.6 mg/dL (ref 0.3–1.2)
Total Protein: 4.7 g/dL — ABNORMAL LOW (ref 6.5–8.1)

## 2020-06-07 LAB — FERRITIN: Ferritin: 907 ng/mL — ABNORMAL HIGH (ref 24–336)

## 2020-06-07 LAB — GLUCOSE, CAPILLARY: Glucose-Capillary: 95 mg/dL (ref 70–99)

## 2020-06-07 LAB — D-DIMER, QUANTITATIVE: D-Dimer, Quant: 2.96 ug/mL-FEU — ABNORMAL HIGH (ref 0.00–0.50)

## 2020-06-07 LAB — C-REACTIVE PROTEIN: CRP: 5 mg/dL — ABNORMAL HIGH (ref <1.0)

## 2020-06-07 MED ORDER — POTASSIUM CHLORIDE 20 MEQ PO PACK
40.0000 meq | PACK | Freq: Once | ORAL | Status: AC
  Administered 2020-06-07: 40 meq via ORAL
  Filled 2020-06-07: qty 2

## 2020-06-07 NOTE — Progress Notes (Addendum)
Physical Therapy Treatment Patient Details Name: Shannon [name] MRN: [id number] DOB: [birth year] Today's Date: 06/07/2020    History of Present Illness Pt is a 74 y.o. male with PMH significant of quadriplegia (2007), Chiari I malformation with syringomyelia, s/p C3-4 and C4-5 anterior cervical discectomy and decompression (04/05/2020), recent fall at home w/ epidural hematoma s/p reexploration, hardware removal and hematoma evacuation on 04/22/20, now quadriplegic with dysphagia, unstageable sacral decubitus wounds, failure to thrive, recurrent hospitalizations; now readmitted 11/28 with septic shock, severe frank hematuria and UTI. 12/14 found to be incidentally COVID+.   PT Comments    Pt with increased flat affect and decreased motivation to participate in therapies. Session focused on BUE/BLE engagement (minimal to no BLE muscle activation noted). Requiring maxA+2 to totalA for repositioning for pressure relief, as well as bed placed in chair position to encourage upright posture. Will trial transfer to recliner with maximove lift next session. Recommend palliative consult for pt to define GOC. Will continue to follow acutely. Noted cervical collar pads soiled; spoke with ortho tech to order replacement pads for collar.    Follow Up Recommendations  SNF     Equipment Recommendations  Hospital bed;Other (comment) (hoyer lift, air mattress)    Recommendations for Other Services  Palliative consult     Precautions / Restrictions Precautions Precautions: Fall;Cervical Precaution Booklet Issued: No Precaution Comments: pt not able to recieve instructions at this time  Required Braces or Orthoses: Cervical Brace Cervical Brace: Hard collar;At all times Restrictions Weight Bearing Restrictions: No Other Position/Activity Restrictions: Per prior documentation, "plan to leave cervical collar for 3 months total, until mid-January"    Mobility  Bed Mobility Overal bed mobility: Needs  Assistance Bed Mobility: Rolling Rolling: Max assist;+2 for physical assistance;Total assist         General bed mobility comments: MaxA+2 to totalA to reposition in bed. Pt then placed in modified chair position to promote upright posture for respiratory health and engagement in ADLs  Transfers                 General transfer comment: Pt declined attempt to sit EOB; will plan for maxisky lift next session  Ambulation/Gait                 Stairs             Wheelchair Mobility    Modified Rankin (Stroke Patients Only)       Balance Overall balance assessment: Needs assistance                                          Cognition Arousal/Alertness: Awake/alert Behavior During Therapy: Flat affect Overall Cognitive Status: No family/caregiver present to determine baseline cognitive functioning Area of Impairment: Orientation;Attention;Memory;Following commands;Safety/judgement;Awareness;Problem solving                 Orientation Level: Disoriented to;Time;Situation Current Attention Level: Sustained Memory: Decreased short-term memory;Decreased recall of precautions Following Commands: Follows one step commands consistently Safety/Judgement: Decreased awareness of safety;Decreased awareness of deficits Awareness: Emergent Problem Solving: Slow processing;Decreased initiation;Difficulty sequencing;Requires verbal cues;Requires tactile cues General Comments: When asked which rehab facility he had come from PTA- pt reports "[facility]" (which is not correct, pt was in community SNF placement). Poor awareness of deficits. Pt with low motivation this date stating "I dont care what you do at this point". Affect did improve with christmas  music played for pt      Exercises General Exercises - Upper Extremity Shoulder Flexion: AAROM;Both;Limitations;5 reps Shoulder Flexion Limitations: 90* Elbow Flexion: AAROM;Both;5 reps Elbow  Extension: AAROM;Both;5 reps Digit Composite Flexion: AAROM;Both;5 reps Composite Extension: AAROM;5 reps General Exercises - Lower Extremity Ankle Circles/Pumps: AAROM;Both;5 reps;Supine Short Arc Quad: AAROM;Both;5 reps;Supine Long Arc Quad: AAROM;Both;5 reps;Supine Hip ABduction/ADduction: AAROM;Both;5 reps;Supine    General Comments        Pertinent Vitals/Pain Pain Assessment: Faces Faces Pain Scale: Hurts even more Pain Location: R side but unable to specify location Pain Descriptors / Indicators: Grimacing;Aching;Moaning Pain Intervention(s): Limited activity within patient's tolerance;Monitored during session;Repositioned    Home Living                      Prior Function            PT Goals (current goals can now be found in the care plan section) Acute Rehab PT Goals Patient Stated Goal: To walk again Progress towards PT goals: Not progressing toward goals - comment (limited by pain)    Frequency    Min 2X/week      PT Plan Current plan remains appropriate    Co-evaluation PT/OT/SLP Co-Evaluation/Treatment: Yes Reason for Co-Treatment: Necessary to address cognition/behavior during functional activity;For patient/therapist safety;To address functional/ADL transfers;Complexity of the patient's impairments (multi-system involvement) PT goals addressed during session: Mobility/safety with mobility OT goals addressed during session: ADL's and self-care;Strengthening/ROM      AM-PAC PT "6 Clicks" Mobility   Outcome Measure  Help needed turning from your back to your side while in a flat bed without using bedrails?: Total Help needed moving from lying on your back to sitting on the side of a flat bed without using bedrails?: Total Help needed moving to and from a bed to a chair (including a wheelchair)?: Total Help needed standing up from a chair using your arms (e.g., wheelchair or bedside chair)?: Total Help needed to walk in hospital room?:  Total Help needed climbing 3-5 steps with a railing? : Total 6 Click Score: 6    End of Session   Activity Tolerance: Patient limited by pain;Patient limited by fatigue Patient left: in bed;with call bell/phone within reach Nurse Communication: Need for lift equipment PT Visit Diagnosis: Muscle weakness (generalized) (M62.81);Difficulty in walking, not elsewhere classified (R26.2);Other symptoms and signs involving the nervous system (R29.898);Other (comment)     Time: 8099-8338 PT Time Calculation (min) (ACUTE ONLY): 22 min  Charges:                       Ina Homes, PT, DPT Acute Rehabilitation Services  Pager [phone] Office [phone]  Malachy Chamber 06/07/2020, 4:35 PM

## 2020-06-07 NOTE — Progress Notes (Signed)
Occupational Therapy Treatment Patient Details Name: Shannon [name] MRN: [id number] DOB: 1945/10/24 Today's Date: 06/07/2020    History of present illness 74 y.o. male with medical history significant of quadriplegia since 2007, Chiari I malformation with syringomyelia. C3-4 and C4-5 anterior cervical discectomy and decompression in 04/05/2020. Had a fall at home developed epidural hematoma, underwent reexploration, removal of hardware, evacuation of hematoma on 10/30, now quadriplegic with dysphagia, unstageable sacral decubitus wounds, failure to thrive, recurrent hospitalizations. Readmitted 11/28 with septic shock, severe frank hematuria and UTI. 12/14 found to be incidentally COVID+   OT comments  Pt seen for OT follow up session with focus on full body exercise, as well as positioning to work toward increased ADL engagement. Pt placed in modified chair position in bed, requiring max A to reposition. He then engaged in full body exercises (listed below in detail). During these exercises, pt c/o pain in R side but is unable to identify where. Noted pt presenting with low motivation this date, as well as cognitive deficits described below. Continue to provide education on pt currents condition and importance of engagement with therapies. Recommend palliative consult for pt to define GOC. D/c recs remain appropriate, will continue to follow.     Follow Up Recommendations  SNF;Supervision/Assistance - 24 hour    Equipment Recommendations  None recommended by OT    Recommendations for Other Services Other (comment) (Palliative Care)    Precautions / Restrictions Precautions Precautions: Fall;Cervical Precaution Booklet Issued: No Precaution Comments: pt not able to recieve instructions at this time  Required Braces or Orthoses: Cervical Brace Cervical Brace: Hard collar;At all times Restrictions Weight Bearing Restrictions: No Other Position/Activity Restrictions: collar too short and  pt moving head freely on arrival with collar adjusted       Mobility Bed Mobility Overal bed mobility: Needs Assistance Bed Mobility: Rolling Rolling: Max assist         General bed mobility comments: Max A to reposition in bed. Pt then placed in modified chair position to promote upright posture for respiratory health and engagement in ADLs  Transfers                      Balance                                           ADL either performed or assessed with clinical judgement   ADL Overall ADL's : Needs assistance/impaired Eating/Feeding: Moderate assistance;Bed level Eating/Feeding Details (indicate cue type and reason): simulated functional hand to mouth pattern. Requires elbow stabilization                                   General ADL Comments: otherwise total A for ADL. Session focused on BUE AAROM to improve BUE strenth for self feeding, grooming, etc. Pt with low motivation this date. stating "just do what you need to do, I dont really care"     Vision Patient Visual Report: No change from baseline Additional Comments: prefers to keep eyes closed mostly   Perception     Praxis      Cognition Arousal/Alertness: Awake/alert Behavior During Therapy: Flat affect Overall Cognitive Status: No family/caregiver present to determine baseline cognitive functioning Area of Impairment: Orientation;Attention;Memory;Following commands;Safety/judgement;Awareness;Problem solving  Orientation Level: Disoriented to;Time;Situation Current Attention Level: Sustained Memory: Decreased short-term memory;Decreased recall of precautions Following Commands: Follows one step commands consistently Safety/Judgement: Decreased awareness of safety;Decreased awareness of deficits Awareness: Emergent Problem Solving: Slow processing;Decreased initiation;Difficulty sequencing;Requires verbal cues;Requires tactile cues General  Comments: When asked which rehab facility he had come from PTA- pt reports "[facility]" (which is not correct, pt was in community SNF placement). Poor awareness of deficits. Pt with low motivation this date stating "I dont care what you do at this point". Affect did improve with christmas music played for pt        Exercises General Exercises - Upper Extremity Shoulder Flexion: AAROM;Both;Limitations;5 reps Shoulder Flexion Limitations: 90* Elbow Flexion: AAROM;Both;5 reps Elbow Extension: AAROM;Both;5 reps Digit Composite Flexion: AAROM;Both;5 reps Composite Extension: AAROM;5 reps General Exercises - Lower Extremity Ankle Circles/Pumps: AAROM;Both;5 reps;Supine Short Arc Quad: AAROM;Both;5 reps;Supine Long Arc Quad: AAROM;Both;5 reps;Supine Hip ABduction/ADduction: AAROM;Both;5 reps;Supine   Shoulder Instructions       General Comments      Pertinent Vitals/ Pain       Pain Assessment: Faces Faces Pain Scale: Hurts even more Pain Location: R side but unable to specify location Pain Descriptors / Indicators: Grimacing;Aching;Moaning Pain Intervention(s): Limited activity within patient's tolerance;Monitored during session;Repositioned  Home Living                                          Prior Functioning/Environment              Frequency           Progress Toward Goals  OT Goals(current goals can now be found in the care plan section)  Progress towards OT goals: Progressing toward goals  Acute Rehab OT Goals Patient Stated Goal: To walk again OT Goal Formulation: With patient Time For Goal Achievement: 06/09/20 Potential to Achieve Goals: Fair  Plan Discharge plan remains appropriate;Frequency remains appropriate    Co-evaluation    PT/OT/SLP Co-Evaluation/Treatment: Yes Reason for Co-Treatment: For patient/therapist safety;To address functional/ADL transfers   OT goals addressed during session: ADL's and  self-care;Strengthening/ROM      AM-PAC OT "6 Clicks" Daily Activity     Outcome Measure   Help from another person eating meals?: A Lot Help from another person taking care of personal grooming?: A Lot Help from another person toileting, which includes using toliet, bedpan, or urinal?: Total Help from another person bathing (including washing, rinsing, drying)?: Total Help from another person to put on and taking off regular upper body clothing?: Total Help from another person to put on and taking off regular lower body clothing?: Total 6 Click Score: 8    End of Session Equipment Utilized During Treatment: Cervical collar  OT Visit Diagnosis: Other abnormalities of gait and mobility (R26.89);Muscle weakness (generalized) (M62.81);Pain Pain - Right/Left: Right Pain - part of body:  (unable to specify certain area)   Activity Tolerance Patient tolerated treatment well   Patient Left in bed;with call bell/phone within reach   Nurse Communication Mobility status        Time: 0630-1601 OT Time Calculation (min): 22 min  Charges: OT General Charges $OT Visit: 1 Visit OT Treatments $Therapeutic Exercise: 8-22 mins  Dalphine Handing, MSOT, OTR/L Acute Rehabilitation Services [facility] Office Number: [phone] Pager: [phone]  Dalphine Handing 06/07/2020, 2:12 PM

## 2020-06-07 NOTE — NC FL2 (Signed)
[place] MEDICAID FL2 LEVEL OF CARE SCREENING TOOL     IDENTIFICATION  Patient Name: Shannon [name] Birthdate: 1946/04/24 Sex: male Admission Date (Current Location): 05/21/2020  [facility] and [place] Number:  Producer, television/film/video and Address:  [facility], [address], [place], [place] [address]      Provider Number: [id number]  Attending Physician Name and Address:  Maretta Bees, MD  Relative Name and Phone Number:  Camuso,[name] Daughter   [phone]    Current Level of Care: Hospital Recommended Level of Care: Skilled Nursing Facility Prior Approval Number:    Date Approved/Denied:   PASRR Number: [id number]  Discharge Plan: SNF    Current Diagnoses: Patient Active Problem List   Diagnosis Date Noted  . Palliative care by specialist   . Goals of care, counseling/discussion   . Respiration abnormal   . Respiratory distress   . Protein-calorie malnutrition, severe 05/05/2020  . Pressure injury of skin 05/04/2020  . Pneumonia 05/03/2020  . Sepsis secondary to UTI (HCC) 05/03/2020  . Urinary retention 05/03/2020  . Chronic indwelling Foley catheter 05/03/2020  . 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 Myelopathy (HCC) 04/22/2020  . Quadriplegia and quadriparesis (HCC)   . Acute blood loss anemia   . Slow transit constipation   . Essential hypertension   . Cervical myelopathy (HCC) 04/07/2020  . Quadriparesis (HCC)   . Benign essential HTN   . Post-operative pain   . Neuropathic pain   . Asthma   . 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    Orientation RESPIRATION BLADDER Height & Weight     Self,Time,Situation,Place  Normal Continent,Indwelling catheter Weight: 235 lb (106.6 kg) Height:  6' (182.9 cm)  BEHAVIORAL SYMPTOMS/MOOD NEUROLOGICAL BOWEL NUTRITION STATUS      Continent Diet (full liquid)  AMBULATORY STATUS COMMUNICATION OF NEEDS Skin   Total Care Verbally Other  (Comment) (open wound)                       Personal Care Assistance Level of Assistance  Bathing,Feeding,Dressing Bathing Assistance: Maximum assistance Feeding assistance: Maximum assistance Dressing Assistance: Maximum assistance     Functional Limitations Info  Sight,Hearing,Speech Sight Info: Adequate Hearing Info: Adequate Speech Info: Adequate    SPECIAL CARE FACTORS FREQUENCY  PT (By licensed PT),OT (By licensed OT)     PT Frequency: 5x week OT Frequency: 5x week            Contractures Contractures Info: Not present    Additional Factors Info  Code Status,Allergies,Isolation Precautions Code Status Info: Full Allergies Info: lodine, penecillins     Isolation Precautions Info: Incidental COVID+     Current Medications (06/07/2020):  This is the current hospital active medication list Current Facility-Administered Medications  Medication Dose Route Frequency Provider Last Rate Last Admin  . 0.9 %  sodium chloride infusion   Intravenous PRN Zannie Cove, MD      . acetaminophen (TYLENOL) tablet 1,000 mg  1,000 mg Oral Q8H PRN John Giovanni, MD   1,000 mg at 06/06/20 1884  . albuterol (VENTOLIN HFA) 108 (90 Base) MCG/ACT inhaler 2 puff  2 puff Inhalation Once PRN Zannie Cove, MD      . Chlorhexidine Gluconate Cloth 2 % PADS 6 each  6 each Topical Daily Leroy Sea, MD   6 each at 06/07/20 [phone]  . collagenase (SANTYL) ointment   Topical Daily Leroy Sea, MD   Given  at 06/05/20 0942  . diphenhydrAMINE (BENADRYL) injection 50 mg  50 mg Intravenous Once PRN Zannie Cove, MD      . docusate sodium (COLACE) capsule 100 mg  100 mg Oral BID Leroy Sea, MD   100 mg at 06/07/20 0808  . EPINEPHrine (EPI-PEN) injection 0.3 mg  0.3 mg Intramuscular Once PRN Zannie Cove, MD      . famotidine (PEPCID) IVPB 20 mg premix  20 mg Intravenous Once PRN Zannie Cove, MD      . feeding supplement (ENSURE ENLIVE / ENSURE PLUS) liquid 237  mL  237 mL Oral TID BM Zannie Cove, MD   237 mL at 06/06/20 1400  . ferrous sulfate tablet 325 mg  325 mg Oral BID WC Leroy Sea, MD   325 mg at 06/07/20 3212  . ferumoxytol (FERAHEME) 510 mg in sodium chloride 0.9 % 100 mL IVPB  510 mg Intravenous Weekly Zannie Cove, MD 468 mL/hr at 06/02/20 2158 510 mg at 06/02/20 2158  . methylPREDNISolone sodium succinate (SOLU-MEDROL) 125 mg/2 mL injection 125 mg  125 mg Intravenous Once PRN Zannie Cove, MD      . midodrine (PROAMATINE) tablet 10 mg  10 mg Oral BID WC Zannie Cove, MD   10 mg at 06/07/20 0807  . ondansetron (ZOFRAN) injection 4 mg  4 mg Intravenous Q6H PRN Earlie Lou L, MD      . oxybutynin (DITROPAN-XL) 24 hr tablet 5 mg  5 mg Oral QHS Zierle-Ghosh, Asia B, DO   5 mg at 06/06/20 2301  . oxyCODONE (Oxy IR/ROXICODONE) immediate release tablet 5 mg  5 mg Oral Q12H PRN Zannie Cove, MD   5 mg at 06/05/20 2253  . pantoprazole (PROTONIX) EC tablet 40 mg  40 mg Oral QHS Leroy Sea, MD   40 mg at 06/06/20 2301  . Resource ThickenUp Clear   Oral PRN Leroy Sea, MD      . sodium chloride irrigation 0.9 % 3,000 mL  3,000 mL Irrigation Continuous Leroy Sea, MD   3,000 mL at 05/30/20 0444  . tamsulosin (FLOMAX) capsule 0.4 mg  0.4 mg Oral Daily Leroy Sea, MD   0.4 mg at 06/07/20 0808     Discharge Medications: Please see discharge summary for a list of discharge medications.  Relevant Imaging Results:  Relevant Lab Results:   Additional Information SSN#[id number]  Mearl Latin, LCSW

## 2020-06-07 NOTE — Progress Notes (Addendum)
PROGRESS NOTE                                                                                                                                                                                                             Patient Demographics:    Shannon [name], is a 74 y.o. male, DOB - 10-22-1945, ZOX:[id number]  Outpatient Primary MD for the patient is McGowen, Maryjean Morn, MD   Admit date - 05/21/2020   LOS - 17  Chief Complaint  Patient presents with  . Unresponsive       Brief Narrative: 74 y.o.malewith medical history significant ofquadriplegia, Chiari I malformation with syringomyelia, ACDF in October 2021, residual quadriplegia, then had a fall at home developed epidural hematoma, underwent reexploration, removal of hardware, evacuation of hematoma on 10/30, now quadriplegic with dysphagia, unstageable sacral decubitus wounds, failure to thrive, recurrent hospitalizations. Subsequently recently hospitalized (11/10-11/18) with aspiration pneumonia/UTI-discharged to SNF. Readmitted 11/28 with septic shock, severe frank hematuria and UTI treated with antibiotics, continuous bladder irrigation-stabilized-plans were for discharge to SNF on 12/14 however ended up with Covid positive test.  COVID-19 vaccinated status: Vaccinated  Significant studies: 11/28>> CT head: No acute intracranial abnormality. 11/28>> chest x-ray: Mild bilateral infrahilar atelectasis. 12/7>> chest x-ray: Right greater than left bibasilar consolidations. 12/8>> chest x-ray: Persistent bibasilar opacities right greater than left. 12/14>> chest x-ray: Unchanged bibasilar opacities.  COVID-19 medications: 12/14>> monoclonal antibody x1  Antibiotics: Cefepime: 12/8>> 12/14 Ceftriaxone: 12/1>> 12/8 Cefepime: 11/28>> 11/30 Vancomycin: 11/28 x 1 Aztreonam: 11/28 x 1  Microbiology data: 11/28>> blood culture: No growth 11/28>> urine culture: No  growth 12/7>> blood culture: No growth 12/7>> urine culture: Achromobacter Xylosoxidans  Procedures: None  Consults: Palliative care  DVT prophylaxis: Place and maintain sequential compression device Start: 05/23/20 1640 SCDs due to hematuria (previously on heparin SQ but stopped as patient developed hematuria  Note-previously on Eliquis for VTE prophylaxis-has been discontinued due to development of hematuria.    Subjective:    Theoren Palka today is lying comfortably in bed-no cough-no shortness of breath.   Assessment  & Plan :   COVID-19 infection: Asymptomatic-treated with MAB-apart from observation-does not require any further treatment at this point.  Septic shock due to UTI/aspiration pneumonia: Sepsis physiology has resolved-has completed a course of antimicrobial therapy  Acute metabolic encephalopathy: Due to sepsis/UTI-resolved.  Aspiration pneumonia/dysphagia: Stable-no  new pneumonia symptoms-SLP following-upgraded to a regular diet with thin liquids.  Remains at risk for future decompensations and aspirations-given quadriplegia/deconditioning and cervical myelopathy at baseline.  Quadriplegia with dysphagia-cervical myelopathy-s/p C-spine surgery on 10/13-subsequently sustained a fall at home and developed epidural hematoma-underwent reexploration/removal of hardware and evacuation of hematoma on 10/30.  Per prior documentation-plan is to leave cervical collar for 3 months total-till mid January.  Continues to have significant quadriplegia (able to move upper extremity side-by-side-unable to lift off the bed against gravity, able to only wiggle toes-unable to lift lower extremities off the bed against gravity)  Hematuria: Resolved.  Etiology felt to be UTI/trauma due to Foley catheter related urethral injury.  Required CBI-that has since been discontinued.  Evaluated by urology with recommendations to continue with Foley catheter on discharge.   HTN: BP remains stable  on midodrine all BP medications on hold  Multifactorial anemia-anemia of chronic disease/vitamin B12 deficiency/borderline iron deficiency/acute blood loss due to hematuria: Hemoglobin relatively stable-s/p 2 units of PRBC this admission.  Follow CBC periodically.  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  Chronic sacral decubitus ulcers UJW:JXBJY sacral decubitus wound which is unstageable. General surgery was consulted., no debridement needed, was getting hydrotherapy, transitioned back to daily dressing changes  Palliative care/goals of care: Remains a full code-appreciate palliative care evaluation this admission.  Patient's overall prognosis remains poor-he is at significant risk for repeated hospitalizations due to recurrent aspirations pneumonia/UTI/sacral decubitus ulcer infection-which potentially could be life disabling or life-threatening.  Patient remains a full code-family wants to continue with full scope of treatment at this point.  Severe protein calorie malnutrition:Withseverecachexia weight loss.Supplements encouraged, now diet advanced  Obesity: Estimated body mass index is 31.87 kg/m as calculated from the following:   Height as of this encounter: 6' (1.829 m).   Weight as of this encounter: 106.6 kg.   RN pressure injury documentation: Pressure Injury 05/03/20 Sacrum Medial Unstageable - Full thickness tissue loss in which the base of the injury is covered by slough (yellow, tan, gray, green or brown) and/or eschar (tan, brown or black) in the wound bed. (Active)  05/03/20 2023  Location: Sacrum  Location Orientation: Medial  Staging: Unstageable - Full thickness tissue loss in which the base of the injury is covered by slough (yellow, tan, gray, green or brown) and/or eschar (tan, brown or black) in the wound bed.  Wound Description (Comments):   Present on Admission: Yes      Pressure Injury 05/22/20 Other (Comment) Right Stage 2 -  Partial thickness loss of dermis presenting as a shallow open injury with a red, pink wound bed without slough. pre-existing, dressing from nursing home on wound.  (Active)  05/22/20 0700  Location: Other (Comment) (clavicle on right)  Location Orientation: Right  Staging: Stage 2 -  Partial thickness loss of dermis presenting as a shallow open injury with a red, pink wound bed without slough.  Wound Description (Comments): pre-existing, dressing from nursing home on wound.   Present on Admission: Yes     Pressure Injury 05/23/20 Sacrum Unstageable - Full thickness tissue loss in which the base of the injury is covered by slough (yellow, tan, gray, green or brown) and/or eschar (tan, brown or black) in the wound bed. (Active)  05/23/20 1216  Location: Sacrum  Location Orientation:   Staging: Unstageable - Full thickness tissue loss in which the base of the injury is covered by slough (yellow,  tan, gray, green or brown) and/or eschar (tan, brown or black) in the wound bed.  Wound Description (Comments):   Present on Admission: Yes     Pressure Injury 06/07/20 Throat Right Stage 3 -  Full thickness tissue loss. Subcutaneous fat may be visible but bone, tendon or muscle are NOT exposed. (Active)  06/07/20 0758  Location: Throat  Location Orientation: Right  Staging: Stage 3 -  Full thickness tissue loss. Subcutaneous fat may be visible but bone, tendon or muscle are NOT exposed.  Wound Description (Comments):   Present on Admission:     GI prophylaxis: PPI  ABG:    Component Value Date/Time   PHART 7.504 (H) 05/03/2020 2303   PCO2ART 34.2 05/03/2020 2303   PO2ART 66.5 (L) 05/03/2020 2303   HCO3 31.4 (H) 05/21/2020 1544   TCO2 33 (H) 05/21/2020 1544   O2SAT 79.0 05/21/2020 1544    Vent Settings: N/A   Condition -Stable  Family Communication  :  Daughter [name] [phone]) left voicemail on 12/15  Code Status :   Full Code  Diet :  Diet Order            Diet regular Room service appropriate? Yes with Assist; Fluid consistency: Thin  Diet effective now                  Disposition Plan  :   Status is: Inpatient  Remains inpatient appropriate because:Inpatient level of care appropriate due to severity of illness   Dispo: The patient is from: SNF              Anticipated d/c is to: SNF              Anticipated d/c date is: 1 day              Patient currently is medically stable to d/c.   Barriers to discharge: Awaiting SNF bed-Covid positive status.  Antimicorbials  :    Anti-infectives (From admission, onward)   Start     Dose/Rate Route Frequency Ordered Stop   06/06/20 1000  ceFEPIme (MAXIPIME) 2 g in sodium chloride 0.9 % 100 mL IVPB        2 g 200 mL/hr over 30 Minutes Intravenous Every 8 hours 06/06/20 0242 06/07/20 0545   05/31/20 1400  ceFEPIme (MAXIPIME) 2 g in sodium chloride 0.9 % 100 mL IVPB  Status:  Discontinued        2 g 200 mL/hr over 30 Minutes Intravenous Every 8 hours 05/31/20 1240 06/06/20 0242   05/24/20 1415  cefTRIAXone (ROCEPHIN) 1 g in sodium chloride 0.9 % 100 mL IVPB        1 g 200 mL/hr over 30 Minutes Intravenous Every 24 hours 05/24/20 1330 05/31/20 1110   05/23/20 1400  ceFEPIme (MAXIPIME) 2 g in sodium chloride 0.9 % 100 mL IVPB  Status:  Discontinued        2 g 200 mL/hr over 30 Minutes Intravenous Every 8 hours 05/23/20 1011 05/24/20 1330   05/22/20 0500  ceFEPIme (MAXIPIME) 2 g in sodium chloride 0.9 % 100 mL IVPB  Status:  Discontinued        2 g 200 mL/hr over 30 Minutes Intravenous Every 12 hours 05/21/20 1614 05/23/20 1011   05/21/20 2015  aztreonam (AZACTAM) 2 g in sodium chloride 0.9 % 100 mL IVPB        2 g 200 mL/hr over 30 Minutes Intravenous  Once 05/21/20 2010 05/21/20 2230  05/21/20 1730  vancomycin (VANCOREADY) IVPB 1750 mg/350 mL        1,750 mg 175 mL/hr over 120 Minutes Intravenous  Once 05/21/20 1722 05/21/20 2148    05/21/20 1545  ceFEPIme (MAXIPIME) 2 g in sodium chloride 0.9 % 100 mL IVPB        2 g 200 mL/hr over 30 Minutes Intravenous  Once 05/21/20 1535 05/21/20 1704      Inpatient Medications  Scheduled Meds: . Chlorhexidine Gluconate Cloth  6 each Topical Daily  . collagenase   Topical Daily  . docusate sodium  100 mg Oral BID  . feeding supplement  237 mL Oral TID BM  . ferrous sulfate  325 mg Oral BID WC  . midodrine  10 mg Oral BID WC  . oxybutynin  5 mg Oral QHS  . pantoprazole  40 mg Oral QHS  . tamsulosin  0.4 mg Oral Daily   Continuous Infusions: . sodium chloride    . famotidine (PEPCID) IV    . ferumoxytol 510 mg (06/02/20 2158)  . sodium chloride irrigation     PRN Meds:.sodium chloride, acetaminophen, albuterol, diphenhydrAMINE, EPINEPHrine, famotidine (PEPCID) IV, methylPREDNISolone (SOLU-MEDROL) injection, [DISCONTINUED] ondansetron **OR** ondansetron (ZOFRAN) IV, oxyCODONE, Resource ThickenUp Clear   Time Spent in minutes  25  See all Orders from today for further details   Jeoffrey MassedShanker Shadae Reino M.D on 06/07/2020 at 11:01 AM  To page go to [email or web address] - use universal password  [facility] -  Office  [phone]    Objective:   Vitals:   06/06/20 2209 06/06/20 2300 06/07/20 0434 06/07/20 0735  BP:  103/65 109/69 114/63  Pulse:  82 78 75  Resp:  18 19 14   Temp: 98.3 F (36.8 C) 98.7 F (37.1 C) 98.9 F (37.2 C) 97.9 F (36.6 C)  TempSrc: Axillary Axillary Axillary Oral  SpO2:  99% 100% 100%  Weight:      Height:        Wt Readings from Last 3 Encounters:  06/02/20 106.6 kg  05/03/20 84.7 kg  04/22/20 91 kg     Intake/Output Summary (Last 24 hours) at 06/07/2020 1101 Last data filed at 06/07/2020 0845 Gross per 24 hour  Intake 120 ml  Output --  Net 120 ml     Physical Exam Gen Exam:Alert awake-not in any distress HEENT:atraumatic, normocephalic Chest: B/L clear to auscultation anteriorly CVS:S1S2 regular Abdomen:soft non tender,  non distended Neurology: Baseline quadriplegia unable to lift any of his extremities off the bed against gravity. Skin: no rash   Data Review:    CBC Recent Labs  Lab 06/02/20 0131 06/03/20 0101 06/04/20 0206 06/05/20 0136 06/07/20 0739  WBC 7.2 5.5 4.9 5.1 6.2  HGB 7.8* 7.8* 7.7* 7.7* 8.0*  HCT 24.8* 24.4* 23.0* 23.6* 24.3*  PLT 109* 119* 109* 116* 137*  MCV 92.9 91.4 90.6 90.4 92.0  MCH 29.2 29.2 30.3 29.5 30.3  MCHC 31.5 32.0 33.5 32.6 32.9  RDW 14.9 14.6 14.7 14.8 15.4    Chemistries  Recent Labs  Lab 06/02/20 0131 06/03/20 0101 06/04/20 0206 06/05/20 0136 06/07/20 0739  NA 138 136 135 135 135  K 3.6 3.5 3.4* 3.4* 3.2*  CL 105 105 102 102 102  CO2 26 26 26 26 28   GLUCOSE 110* 108* 97 114* 96  BUN 10 7* 9 7* 6*  CREATININE 0.67 0.58* 0.54* 0.46* 0.53*  CALCIUM 7.0* 7.3* 7.3* 7.5* 7.6*  AST 63*  --   --   --  70*  ALT 34  --   --   --  47*  ALKPHOS 75  --   --   --  80  BILITOT 0.5  --   --   --  0.6   ------------------------------------------------------------------------------------------------------------------ No results for input(s): CHOL, HDL, LDLCALC, TRIG, CHOLHDL, LDLDIRECT in the last 72 hours.  No results found for: HGBA1C ------------------------------------------------------------------------------------------------------------------ No results for input(s): TSH, T4TOTAL, T3FREE, THYROIDAB in the last 72 hours.  Invalid input(s): FREET3 ------------------------------------------------------------------------------------------------------------------ Recent Labs    06/07/20 0032  FERRITIN 907*    Coagulation profile No results for input(s): INR, PROTIME in the last 168 hours.  Recent Labs    06/07/20 0032  DDIMER 2.96*    Cardiac Enzymes No results for input(s): CKMB, TROPONINI, MYOGLOBIN in the last 168 hours.  Invalid input(s):  CK ------------------------------------------------------------------------------------------------------------------    Component Value Date/Time   BNP 32.0 05/24/2020 0127    Micro Results Recent Results (from the past 240 hour(s))  Culture, blood (routine x 2)     Status: None   Collection Time: 05/30/20  4:29 PM   Specimen: BLOOD LEFT HAND  Result Value Ref Range Status   Specimen Description BLOOD LEFT HAND  Final   Special Requests   Final    BOTTLES DRAWN AEROBIC AND ANAEROBIC Blood Culture results may not be optimal due to an inadequate volume of blood received in culture bottles   Culture   Final    NO GROWTH 5 DAYS Performed at [facility], [address], [place], [place] [address]    Report Status 06/04/2020 FINAL  Final  Culture, blood (routine x 2)     Status: None   Collection Time: 05/30/20  4:32 PM   Specimen: BLOOD LEFT ARM  Result Value Ref Range Status   Specimen Description BLOOD LEFT ARM  Final   Special Requests   Final    BOTTLES DRAWN AEROBIC ONLY Blood Culture results may not be optimal due to an inadequate volume of blood received in culture bottles   Culture   Final    NO GROWTH 5 DAYS Performed at [facility], [address] [place], [place] [address]    Report Status 06/04/2020 FINAL  Final  Culture, Urine     Status: Abnormal   Collection Time: 05/30/20  6:03 PM   Specimen: Urine, Random  Result Value Ref Range Status   Specimen Description URINE, RANDOM  Final   Special Requests   Final    NONE Performed at [facility], [address] [place], [place] [address]    Culture 20,000 COLONIES/mL ACHROMOBACTER XYLOSOXIDANS (A)  Final   Report Status 06/02/2020 FINAL  Final   Organism ID, Bacteria ACHROMOBACTER XYLOSOXIDANS (A)  Final      Susceptibility   Achromobacter xylosoxidans - MIC*    CEFAZOLIN >=64 RESISTANT Resistant     GENTAMICIN >=16 RESISTANT Resistant     CIPROFLOXACIN >=4 RESISTANT Resistant     IMIPENEM  8 INTERMEDIATE Intermediate     TRIMETH/SULFA <=20 SENSITIVE Sensitive     * 20,000 COLONIES/mL ACHROMOBACTER XYLOSOXIDANS  SARS Coronavirus 2 by RT PCR (hospital order, performed in [facility] hospital lab) Nasopharyngeal Nasopharyngeal Swab     Status: Abnormal   Collection Time: 06/06/20 12:04 PM   Specimen: Nasopharyngeal Swab  Result Value Ref Range Status   SARS Coronavirus 2 POSITIVE (A) NEGATIVE Final    Comment: RESULT CALLED TO, READ BACK BY AND VERIFIED WITH: Vernie Ammons RN [phone]  06/06/20 A BROWNING (NOTE) SARS-CoV-2 target nucleic acids are DETECTED  SARS-CoV-2 RNA is generally detectable in upper respiratory specimens  during the acute phase of infection.  Positive results are indicative  of the presence of the identified virus, but do not rule out bacterial infection or co-infection with other pathogens not detected by the test.  Clinical correlation with patient history and  other diagnostic information is necessary to determine patient infection status.  The expected result is negative.  Fact Sheet for Patients:   BoilerBrush.com.cy   Fact Sheet for Healthcare Providers:   https://pope.com/    This test is not yet approved or cleared by the [place] FDA and  has been authorized for detection and/or diagnosis of SARS-CoV-2 by FDA under an Emergency Use Authorization (EUA).  This EUA will remain in effect (meaning t his test can be used) for the duration of  the COVID-19 declaration under Section 564(b)(1) of the Act, 21 U.S.C. section 360-bbb-3(b)(1), unless the authorization is terminated or revoked sooner.  Performed at [facility], [address], [place], [place] [address]   SARS Coronavirus 2 by RT PCR (hospital order, performed in [facility] hospital lab) Nasopharyngeal Nasopharyngeal Swab     Status: Abnormal   Collection Time: 06/06/20  3:46 PM   Specimen: Nasopharyngeal Swab  Result Value Ref  Range Status   SARS Coronavirus 2 POSITIVE (A) NEGATIVE Final    Comment: RESULT CALLED TO, READ BACK BY AND VERIFIED WITH: Vernie Ammons RN [phone] 06/06/20 A BROWNING (NOTE) SARS-CoV-2 target nucleic acids are DETECTED  SARS-CoV-2 RNA is generally detectable in upper respiratory specimens  during the acute phase of infection.  Positive results are indicative  of the presence of the identified virus, but do not rule out bacterial infection or co-infection with other pathogens not detected by the test.  Clinical correlation with patient history and  other diagnostic information is necessary to determine patient infection status.  The expected result is negative.  Fact Sheet for Patients:   BoilerBrush.com.cy   Fact Sheet for Healthcare Providers:   https://pope.com/    This test is not yet approved or cleared by the [place] FDA and  has been authorized for detection and/or diagnosis of SARS-CoV-2 by FDA under an Emergency Use Authorization (EUA).  This EUA will remain in effect (meaning t his test can be used) for the duration of  the COVID-19 declaration under Section 564(b)(1) of the Act, 21 U.S.C. section 360-bbb-3(b)(1), unless the authorization is terminated or revoked sooner.  Performed at [facility], [address] [place], [place] [address]     Radiology Reports DG Chest 2 View  Result Date: 05/31/2020 CLINICAL DATA:  Fever, hypertension, former smoker EXAM: CHEST - 2 VIEW COMPARISON:  05/30/2020 FINDINGS: Normal heart size, mediastinal contours, and pulmonary vascularity. RIGHT greater than LEFT basilar opacities question pneumonia versus atelectasis little changed. Small bibasilar effusions larger on RIGHT. No pneumothorax. Degenerative changes of LEFT glenohumeral joint. IMPRESSION: Persistent bibasilar opacities question atelectasis versus pneumonia with small bibasilar effusions, both greater on RIGHT.  Electronically Signed   By: Ulyses Southward M.D.   On: 05/31/2020 08:56   CT Head Wo Contrast  Result Date: 05/21/2020 CLINICAL DATA:  Mental status change. EXAM: CT HEAD WITHOUT CONTRAST TECHNIQUE: Contiguous axial images were obtained from the base of the skull through the vertex without intravenous contrast. COMPARISON:  May 03, 2020 FINDINGS: Brain: No evidence of acute infarction, hemorrhage, hydrocephalus, extra-axial collection or mass lesion/mass  effect. Mild brain parenchymal volume loss and deep white matter microangiopathy. Vascular: No hyperdense vessel or unexpected calcification. Skull: Normal. Negative for fracture or focal lesion. Sinuses/Orbits: No acute finding. Other: None. IMPRESSION: 1. No acute intracranial abnormality. 2. 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 Electronically Signed   By: Ted Mcalpine M.D.   On: 05/21/2020 17:53   DG CHEST PORT 1 VIEW  Result Date: 06/06/2020 CLINICAL DATA:  Hypoxia EXAM: PORTABLE CHEST 1 VIEW COMPARISON:  05/31/2020 and prior FINDINGS: No pneumothorax. Patchy bibasilar opacities and small effusions, unchanged. Stable cardiomediastinal silhouette and osseous structures. IMPRESSION: Bibasilar opacities and small effusions, unchanged. Electronically Signed   By: Stana Bunting M.D.   On: 06/06/2020 19:17   DG CHEST PORT 1 VIEW  Result Date: 05/30/2020 CLINICAL DATA:  Fever chest pain EXAM: PORTABLE CHEST 1 VIEW COMPARISON:  05/21/2020, 05/08/2020, 05/06/2020 FINDINGS: Low lung volumes with elevation of right diaphragm. Similar right greater than left basilar consolidations. Possible small pleural effusions. Stable cardiomediastinal silhouette. No pneumothorax IMPRESSION: Similar appearance of right greater than left basilar consolidations, atelectasis versus pneumonia, with possible small pleural effusions. Electronically Signed   By: Jasmine Pang M.D.   On: 05/30/2020 19:16   DG Chest Port 1 View  Result  Date: 05/21/2020 CLINICAL DATA:  Found unresponsive. EXAM: PORTABLE CHEST 1 VIEW COMPARISON:  May 08, 2020 FINDINGS: There is stable elevation of the right hemidiaphragm. Mild bilateral infrahilar atelectasis is seen. There is no evidence of a pleural effusion or pneumothorax. The heart size and mediastinal contours are within normal limits. The visualized skeletal structures are unremarkable. IMPRESSION: Mild bilateral infrahilar atelectasis. Electronically Signed   By: Aram Candela M.D.   On: 05/21/2020 16:34   DG Swallowing Func-Speech Pathology  Result Date: 06/05/2020 Objective Swallowing Evaluation: Type of Study: MBS-Modified Barium Swallow Study  Patient Details Name: Jayren Cease MRN: [id number] Date of Birth: 11-Oct-1945 Today's Date: 06/05/2020 Time: SLP Start Time (ACUTE ONLY): 1300 -SLP Stop Time (ACUTE ONLY): 1316 SLP Time Calculation (min) (ACUTE ONLY): 16 min Past Medical History: Past Medical History: Diagnosis Date . Asthma  . 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 Newell Coral, 1992-->surg at [facility]  Summer 2021->Cervicalgia,arm pain, hand atrophy RUE wkness, hyperreflex (Dr. 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 Hay fever  . Hypertension  . Osteoarthritis of both hands  Past Surgical History: Past Surgical History: Procedure Laterality Date . ANTERIOR CERVICAL DECOMP/DISCECTOMY FUSION N/A 04/05/2020  Procedure: ANTERIOR CERVICAL DECOMPRESSION/DISCECTOMY FUSION, INTERBODY PROSTHESIS, PLATE/SCREWS CERVICAL THREE-CERVICAL FOUR, CERVICAL FOUR- CERVICAL FIVE;  Surgeon: Tressie Stalker, MD;  Location: [facility] OR;  Service: Neurosurgery;  Laterality: N/A; . ANTERIOR CERVICAL DECOMP/DISCECTOMY FUSION N/A 04/22/2020  Procedure: Reexploration of anterior cervical wound for epidural hematoma;  Surgeon: Donalee Citrin, MD;  Location: [facility] OR;  Service:  Neurosurgery;  Laterality: N/A; . BACK SURGERY   . INCISION AND DRAINAGE Left 2012  L hand infection . ROTATOR CUFF REPAIR Right   x 3 . SPINE SURGERY   HPI: Johnwesley Lederman Smithis a 74 y.o.malewith medical history significant ofquadriplegia which follows surgery for Chiari I malformation with syringomyelia surgery in October 2021.  He originally had a C3-4 [place] 4-5 anterior cervical disc ACDF with decompression, C3 3-4 and C4-5 interbody arthrodesis with local autograft bone, anterior cervical plating of C3-C5 on 04/05/2020.  He then went home and had a fall from his bed, he  was then hospitalized and from here he was sent to a nursing home, he has been quadriplegic since then, he has also developed sacral decubitus ulcers. Was seen for MBS on 05/04/20 "deficits resulted in silent aspiration of thin liquid and nectar thick liquid by straw prior to the swallow. With nectar thick liquid by cup, only transient penetration was seen. There was significant pharyngeal residue with puree and solid consistencies which was reduced but not fully cleared with liquid wash." Now admitted for AMS due to UTI, sepsis and hypotension in a patient with indwelling Foley catheter POA and sacral decubitus ulcers POA who is quadriplegic. Pt/niece preferred to remain on nectar thick full liquids- pt stable and ST signed off. Re-ordered by hospitalist "suspicion for aspiration pna".   Subjective: alert, cooperative, pleasant, participative. Assessment / Plan / Recommendation [facility] IP CLINICAL IMPRESSIONS 06/05/2020 Clinical Impression Pt demonstrates normal swallow ability. No penetration, aspiration or residue was observed. Pt at times had mild spillage of oral residue to pharynx which he sensed and swallowed, not outside of normal limits. He is able to masticate and esophageal sweep with solids appeared WNL. Will initiate a regular diet and thin liquids to allow foods of choice though pt will need total assist feeding. Will f/u for tolerance.  SLP Visit Diagnosis Dysphagia, unspecified (R13.10) Attention and concentration deficit following -- Frontal lobe and executive function deficit following -- Impact on safety and function Mild aspiration risk;Risk for inadequate nutrition/hydration   [facility] IP TREATMENT RECOMMENDATION 06/05/2020 Treatment Recommendations Therapy as outlined in treatment plan below   Prognosis 06/05/2020 Prognosis for Safe Diet Advancement Good Barriers to Reach Goals -- Barriers/Prognosis Comment -- [facility] IP DIET RECOMMENDATION 06/05/2020 SLP Diet Recommendations Regular solids;Thin liquid Liquid Administration via Cup;Straw Medication Administration Whole meds with liquid Compensations Slow rate;Small sips/bites;Follow solids with liquid Postural Changes --   [facility] IP OTHER RECOMMENDATIONS 06/05/2020 Recommended Consults -- Oral Care Recommendations Oral care BID Other Recommendations --   [facility] IP FOLLOW UP RECOMMENDATIONS 06/05/2020 Follow up Recommendations Skilled Nursing facility   [facility] IP FREQUENCY AND DURATION 06/05/2020 Speech Therapy Frequency (ACUTE ONLY) min 2x/week Treatment Duration 2 weeks      CHL IP ORAL PHASE 06/05/2020 Oral Phase WFL Oral - Pudding Teaspoon -- Oral - Pudding Cup -- Oral - Honey Teaspoon -- Oral - Honey Cup -- Oral - Nectar Teaspoon -- Oral - Nectar Cup -- Oral - Nectar Straw -- Oral - Thin Teaspoon -- Oral - Thin Cup -- Oral - Thin Straw -- Oral - Puree -- Oral - Mech Soft -- Oral - Regular -- Oral - Multi-Consistency -- Oral - Pill -- Oral Phase - Comment --  [facility]L IP PHARYNGEAL PHASE 06/05/2020 Pharyngeal Phase WFL Pharyngeal- Pudding Teaspoon -- Pharyngeal -- Pharyngeal- Pudding Cup -- Pharyngeal -- Pharyngeal- Honey Teaspoon -- Pharyngeal -- Pharyngeal- Honey Cup -- Pharyngeal -- Pharyngeal- Nectar Teaspoon -- Pharyngeal -- Pharyngeal- Nectar Cup -- Pharyngeal -- Pharyngeal- Nectar Straw -- Pharyngeal -- Pharyngeal- Thin Teaspoon -- Pharyngeal -- Pharyngeal- Thin Cup -- Pharyngeal -- Pharyngeal- Thin  Straw -- Pharyngeal -- Pharyngeal- Puree -- Pharyngeal -- Pharyngeal- Mechanical Soft -- Pharyngeal -- Pharyngeal- Regular -- Pharyngeal -- Pharyngeal- Multi-consistency -- Pharyngeal -- Pharyngeal- Pill -- Pharyngeal -- Pharyngeal Comment --  CHL IP CERVICAL ESOPHAGEAL PHASE 06/05/2020 Cervical Esophageal Phase -- Pudding Teaspoon -- Pudding Cup -- Honey Teaspoon -- Honey Cup -- Nectar Teaspoon -- Nectar Cup -- Nectar Straw -- Thin Teaspoon -- Thin Cup WFL Thin Straw WFL Puree WFL Mechanical Soft WFL  Regular -- Multi-consistency -- Pill WFL Cervical Esophageal Comment -- Harlon Ditty, MA CCC-SLP Acute Rehabilitation Services Pager [phone] Office [phone] Claudine Mouton 06/05/2020, 1:53 PM

## 2020-06-07 NOTE — Progress Notes (Signed)
Physical Therapy Wound Treatment Patient Details  Name: Shannon [name] MRN: [id number] Date of Birth: [birth year]  Today's Date: 06/07/2020 Time: 0900-0950 Time Calculation (min): 50 min  Subjective  Subjective: Agreeable to hydrotherapy this session. Patient and Family Stated Goals: To not hurt anymore Prior Treatments: Dressing changes  Pain Score:  Very painful throughout session and treatment was limited.   Wound Assessment  Pressure Injury 05/23/20 Sacrum Unstageable - Full thickness tissue loss in which the base of the injury is covered by slough (yellow, tan, gray, green or brown) and/or eschar (tan, brown or black) in the wound bed. (Active)  Dressing Type ABD;Barrier Film (skin prep);Gauze (Comment);Moist to dry 06/07/20 1323  Dressing Changed;Clean;Dry;Intact 06/07/20 1323  Dressing Change Frequency Daily 06/07/20 1323  State of Healing Early/partial granulation 06/07/20 1323  Site / Wound Assessment Red;Yellow;Black 06/07/20 1323  % Wound base Red or Granulating 50% 06/07/20 1323  % Wound base Yellow/Fibrinous Exudate 30% 06/07/20 1323  % Wound base Black/Eschar 20% 06/07/20 1323  % Wound base Other/Granulation Tissue (Comment) 0% 06/07/20 1323  Peri-wound Assessment Intact 06/07/20 1323  Wound Length (cm) 11 cm 05/30/20 1206  Wound Width (cm) 9.5 cm 05/30/20 1206  Wound Depth (cm) 3.2 cm 05/30/20 1206  Wound Surface Area (cm^2) 104.5 cm^2 05/30/20 1206  Wound Volume (cm^3) 334.4 cm^3 05/30/20 1206  Margins Unattached edges (unapproximated) 06/07/20 1323  Drainage Amount Minimal 06/07/20 1323  Drainage Description Serosanguineous 06/07/20 1323  Treatment Hydrotherapy (Pulse lavage);Packing (Saline gauze) 06/07/20 1323   Santyl not available this session.    Hydrotherapy Pulsed lavage therapy - wound location: Sacrum Pulsed Lavage with Suction (psi): 4 psi Pulsed Lavage with Suction - Normal Saline Used: 500 mL Pulsed Lavage Tip: Tip with splash shield Selective  Debridement Selective Debridement - Location: Sacrum Selective Debridement - Tools Used: Forceps;Scissors Selective Debridement - Tissue Removed: yellow slough, brown unviable tissue   Wound Assessment and Plan  Wound Therapy - Assess/Plan/Recommendations Wound Therapy - Clinical Statement: Pt agreeable to hydrotherapy however very painful. Pulse lavage performed only and debridement was deferred. Will decrease hydrotherapy frequency to 3x/week. This patient will benefit from continued hydrotherapy for selective removal of unviable tissue, to decrease bioburden and promote wound bed healing. Wound Therapy - Functional Problem List: Decreased mobility and global weakness Factors Delaying/Impairing Wound Healing: Immobility Hydrotherapy Plan: Debridement;Dressing change;Patient/family education;Pulsatile lavage with suction Wound Therapy - Frequency: 3X / week Wound Therapy - Follow Up Recommendations: Skilled nursing facility Wound Plan: See above  Wound Therapy Goals- Improve the function of patient's integumentary system by progressing the wound(s) through the phases of wound healing (inflammation - proliferation - remodeling) by: Decrease Necrotic Tissue to: 20% Decrease Necrotic Tissue - Progress: Progressing toward goal Increase Granulation Tissue to: 80% Increase Granulation Tissue - Progress: Progressing toward goal Goals/treatment plan/discharge plan were made with and agreed upon by patient/family: Yes Time For Goal Achievement: 7 days Wound Therapy - Potential for Goals: Fair  Goals will be updated until maximal potential achieved or discharge criteria met.  Discharge criteria: when goals achieved, discharge from hospital, MD decision/surgical intervention, no progress towards goals, refusal/missing three consecutive treatments without notification or medical reason.  GP     Thelma Comp 06/07/2020, 1:30 PM   Rolinda Roan, PT, DPT Acute Rehabilitation Services Pager:  [phone] Office: [phone]

## 2020-06-07 NOTE — TOC Progression Note (Addendum)
Transition of Care [facility]) - Progression Note    Patient Details  Name: Shannon [name] MRN: [id number] Date of Birth: 06-25-1945  [facility] of [facility]) CM/SW Contact  Mearl Latin, LCSW Phone Number: 06/07/2020, 8:43 AM  Clinical Narrative:    8:43am-CSW awaiting response from [facility] on if they have a bed available for patient on their COVID unit.  10:54am-[facility] does not have a bed available yet for patient but will keep CSW updated.    Expected Discharge Plan: Skilled Nursing Facility Barriers to Discharge: Continued Medical Work up  Expected Discharge Plan and Services Expected Discharge Plan: Skilled Nursing Facility       Living arrangements for the past 2 months: Skilled Nursing Facility [facility]) Expected Discharge Date: 06/06/20                                     Social Determinants of Health (SDOH) Interventions    Readmission Risk Interventions No flowsheet data found.

## 2020-06-08 LAB — BASIC METABOLIC PANEL
Anion gap: 9 (ref 5–15)
BUN: 5 mg/dL — ABNORMAL LOW (ref 8–23)
CO2: 25 mmol/L (ref 22–32)
Calcium: 7.7 mg/dL — ABNORMAL LOW (ref 8.9–10.3)
Chloride: 103 mmol/L (ref 98–111)
Creatinine, Ser: 0.57 mg/dL — ABNORMAL LOW (ref 0.61–1.24)
GFR, Estimated: >60 mL/min (ref >60)
Glucose, Bld: 77 mg/dL (ref 70–99)
Potassium: 3.6 mmol/L (ref 3.5–5.1)
Sodium: 137 mmol/L (ref 135–145)

## 2020-06-08 MED ORDER — PROSOURCE PLUS PO LIQD
30.0000 mL | Freq: Three times a day (TID) | ORAL | Status: DC
  Administered 2020-06-08 – 2020-06-11 (×10): 30 mL via ORAL
  Filled 2020-06-08 (×12): qty 30

## 2020-06-08 MED ORDER — ENSURE ENLIVE PO LIQD
237.0000 mL | Freq: Four times a day (QID) | ORAL | Status: DC
  Administered 2020-06-08 – 2020-06-10 (×10): 237 mL via ORAL

## 2020-06-08 MED ORDER — ENOXAPARIN SODIUM 40 MG/0.4ML ~~LOC~~ SOLN
40.0000 mg | SUBCUTANEOUS | Status: DC
  Administered 2020-06-08 – 2020-06-12 (×5): 40 mg via SUBCUTANEOUS
  Filled 2020-06-08 (×6): qty 0.4

## 2020-06-08 NOTE — Progress Notes (Signed)
Nutrition Follow-up  INTERVENTION:  Provide Ensure Enlive po QID, each supplement provides 350 kcal and 20 grams of protein.  Provide 30 ml Prosource plus po TID, each supplement provides 100 kcal and 15 grams of protein.   Encourage adequate PO intake.   NUTRITION DIAGNOSIS:   Increased nutrient needs related to wound healing as evidenced by estimated needs; ongoing  GOAL:   Patient will meet greater than or equal to 90% of their needs; progressing  MONITOR:   PO intake,Supplement acceptance,Skin,Weight trends,Labs,I & O's  REASON FOR ASSESSMENT:   LOS    ASSESSMENT:   Patient with PMH significant for quadriplegia, Chiari I malformation with syringomyelia, ACDF, recent fall with resulting epidural hematoma (evacuation 10/30), dysphagia, and unstageable sacral decubitus wounds. Presents this admission with UTI and aspiration PNA. COVID positive 12/14. Pt remains full code per palliative care.   Pt is currently on a regular diet with thin liquids. Meal completion has been 10-25%. Pt unavailable during attempted time of contact. Pt currently has Ensure ordered and has been consuming them. RD to increase nutritional supplementation orders to aid in caloric and protein needs. Labs and medications reviewed.   Diet Order:   Diet Order            Diet regular Room service appropriate? Yes with Assist; Fluid consistency: Thin  Diet effective now                 EDUCATION NEEDS:   Not appropriate for education at this time  Skin:  Skin Assessment: Skin Integrity Issues: Skin Integrity Issues:: Wound VAC Stage II: buttocks Unstageable: bilateral sacrum Wound Vac: sacrum  Last BM:  12/14  Height:   Ht Readings from Last 1 Encounters:  05/22/20 6' (1.829 m)    Weight:   Wt Readings from Last 1 Encounters:  06/02/20 106.6 kg   BMI:  Body mass index is 31.87 kg/m.  Estimated Nutritional Needs:   Kcal:  2200-2400 kcal  Protein:  110-130 grams  Fluid:  >/= 2  L/day  Roslyn Smiling, MS, RD, LDN RD pager number/after hours weekend pager number on Amion.

## 2020-06-08 NOTE — Progress Notes (Signed)
PROGRESS NOTE                                                                                                                                                                                                             Patient Demographics:    Shannon [name], is a 74 y.o. male, DOB - May 16, 1946, [id number]:[id number]  Outpatient Primary MD for the patient is McGowen, Maryjean Morn, MD   Admit date - 05/21/2020   LOS - 18  Chief Complaint  Patient presents with  . Unresponsive       Brief Narrative: 74 y.o.malewith medical history significant ofquadriplegia, Chiari I malformation with syringomyelia, ACDF in October 2021, residual quadriplegia, then had a fall at home developed epidural hematoma, underwent reexploration, removal of hardware, evacuation of hematoma on 10/30, now quadriplegic with dysphagia, unstageable sacral decubitus wounds, failure to thrive, recurrent hospitalizations. Subsequently recently hospitalized (11/10-11/18) with aspiration pneumonia/UTI-discharged to SNF. Readmitted 11/28 with septic shock, severe frank hematuria and UTI treated with antibiotics, continuous bladder irrigation-stabilized-plans were for discharge to SNF on 12/14 however ended up with Covid positive test.  COVID-19 vaccinated status: Vaccinated  Significant studies: 11/28>> CT head: No acute intracranial abnormality. 11/28>> chest x-ray: Mild bilateral infrahilar atelectasis. 12/7>> chest x-ray: Right greater than left bibasilar consolidations. 12/8>> chest x-ray: Persistent bibasilar opacities right greater than left. 12/14>> chest x-ray: Unchanged bibasilar opacities.  COVID-19 medications: 12/14>> monoclonal antibody x1  Antibiotics: Cefepime: 12/8>> 12/14 Ceftriaxone: 12/1>> 12/8 Cefepime: 11/28>> 11/30 Vancomycin: 11/28 x 1 Aztreonam: 11/28 x 1  Microbiology data: 11/28>> blood culture: No growth 11/28>> urine culture: No  growth 12/7>> blood culture: No growth 12/7>> urine culture: Achromobacter Xylosoxidans  Procedures: None  Consults: Palliative care  DVT prophylaxis: Place and maintain sequential compression device Start: 05/23/20 1640 SCDs  Restart SQ Lovenox 12/16-watch closely for hematuria.  Note-previously on Eliquis for VTE prophylaxis-has been discontinued due to development of hematuria.    Subjective:  Lying comfortably in bed-no major issues overnight per RN.   Assessment  & Plan :   COVID-19 infection: Asymptomatic-treated with MAB-apart from observation-does not require any further treatment at this point.  Septic shock due to UTI/aspiration pneumonia: Sepsis physiology has resolved-has completed a course of antimicrobial therapy  Acute metabolic encephalopathy: Due to sepsis/UTI-resolved.  Aspiration pneumonia/dysphagia: Stable-no new pneumonia symptoms-SLP following-upgraded to a regular diet with thin  liquids.  Remains at risk for future decompensations and aspirations-given quadriplegia/deconditioning and cervical myelopathy at baseline.  Quadriplegia with dysphagia-cervical myelopathy-s/p C-spine surgery on 10/13-subsequently sustained a fall at home and developed epidural hematoma-underwent reexploration/removal of hardware and evacuation of hematoma on 10/30.  Per prior documentation-plan is to leave cervical collar for 3 months total-till mid January.  Continues to have significant quadriplegia (able to move upper extremity side-by-side-unable to lift off the bed against gravity, able to only wiggle toes-unable to lift lower extremities off the bed against gravity)  Hematuria: Resolved.  Etiology felt to be UTI/trauma due to Foley catheter related urethral injury.  Required CBI-that has since been discontinued.  Evaluated by urology with recommendations to continue with Foley catheter on discharge.   HTN: BP remains stable on midodrine all BP medications on  hold  Multifactorial anemia-anemia of chronic disease/vitamin B12 deficiency/borderline iron deficiency/acute blood loss due to hematuria: Hemoglobin relatively stable-s/p 2 units of PRBC this admission.  Follow CBC periodically.  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  Chronic sacral decubitus ulcers UJW:JXBJY sacral decubitus wound which is unstageable. General surgery was consulted., no debridement needed, was getting hydrotherapy, transitioned back to daily dressing changes  Palliative care/goals of care: Remains a full code-appreciate palliative care evaluation this admission.  Patient's overall prognosis remains poor-he is at significant risk for repeated hospitalizations due to recurrent aspirations pneumonia/UTI/sacral decubitus ulcer infection-which potentially could be life disabling or life-threatening.  Patient remains a full code-family wants to continue with full scope of treatment at this point.  Severe protein calorie malnutrition:Withseverecachexia weight loss.Supplements encouraged, now diet advanced  Obesity: Estimated body mass index is 31.87 kg/m as calculated from the following:   Height as of this encounter: 6' (1.829 m).   Weight as of this encounter: 106.6 kg.   RN pressure injury documentation: Pressure Injury 05/03/20 Sacrum Medial Unstageable - Full thickness tissue loss in which the base of the injury is covered by slough (yellow, tan, gray, green or brown) and/or eschar (tan, brown or black) in the wound bed. (Active)  05/03/20 2023  Location: Sacrum  Location Orientation: Medial  Staging: Unstageable - Full thickness tissue loss in which the base of the injury is covered by slough (yellow, tan, gray, green or brown) and/or eschar (tan, brown or black) in the wound bed.  Wound Description (Comments):   Present on Admission: Yes     Pressure Injury 05/22/20 Other  (Comment) Right Stage 2 -  Partial thickness loss of dermis presenting as a shallow open injury with a red, pink wound bed without slough. pre-existing, dressing from nursing home on wound.  (Active)  05/22/20 0700  Location: Other (Comment) (clavicle on right)  Location Orientation: Right  Staging: Stage 2 -  Partial thickness loss of dermis presenting as a shallow open injury with a red, pink wound bed without slough.  Wound Description (Comments): pre-existing, dressing from nursing home on wound.   Present on Admission: Yes     Pressure Injury 05/23/20 Sacrum Unstageable - Full thickness tissue loss in which the base of the injury is covered by slough (yellow, tan, gray, green or brown) and/or eschar (tan, brown or black) in the wound bed. (Active)  05/23/20 1216  Location: Sacrum  Location Orientation:   Staging: Unstageable - Full thickness tissue loss in which the base of the injury is covered by slough (yellow, tan, gray, green or brown) and/or eschar (tan, brown or  black) in the wound bed.  Wound Description (Comments):   Present on Admission: Yes     Pressure Injury 06/07/20 Throat Right Stage 3 -  Full thickness tissue loss. Subcutaneous fat may be visible but bone, tendon or muscle are NOT exposed. (Active)  06/07/20 0758  Location: Throat  Location Orientation: Right  Staging: Stage 3 -  Full thickness tissue loss. Subcutaneous fat may be visible but bone, tendon or muscle are NOT exposed.  Wound Description (Comments):   Present on Admission:     GI prophylaxis: PPI  ABG:    Component Value Date/Time   PHART 7.504 (H) 05/03/2020 2303   PCO2ART 34.2 05/03/2020 2303   PO2ART 66.5 (L) 05/03/2020 2303   HCO3 31.4 (H) 05/21/2020 1544   TCO2 33 (H) 05/21/2020 1544   O2SAT 79.0 05/21/2020 1544    Vent Settings: N/A   Condition -Stable  Family Communication  :  Daughter [name] [phone]) left voicemail on 12/16  Code Status :  Full Code  Diet :  Diet Order             Diet regular Room service appropriate? Yes with Assist; Fluid consistency: Thin  Diet effective now                  Disposition Plan  :   Status is: Inpatient  Remains inpatient appropriate because:Inpatient level of care appropriate due to severity of illness   Dispo: The patient is from: SNF              Anticipated d/c is to: SNF              Anticipated d/c date is: 1 day              Patient currently is medically stable to d/c.   Barriers to discharge: Awaiting SNF bed-Covid positive status.  Antimicorbials  :    Anti-infectives (From admission, onward)   Start     Dose/Rate Route Frequency Ordered Stop   06/06/20 1000  ceFEPIme (MAXIPIME) 2 g in sodium chloride 0.9 % 100 mL IVPB        2 g 200 mL/hr over 30 Minutes Intravenous Every 8 hours 06/06/20 0242 06/07/20 2233   05/31/20 1400  ceFEPIme (MAXIPIME) 2 g in sodium chloride 0.9 % 100 mL IVPB  Status:  Discontinued        2 g 200 mL/hr over 30 Minutes Intravenous Every 8 hours 05/31/20 1240 06/06/20 0242   05/24/20 1415  cefTRIAXone (ROCEPHIN) 1 g in sodium chloride 0.9 % 100 mL IVPB        1 g 200 mL/hr over 30 Minutes Intravenous Every 24 hours 05/24/20 1330 05/31/20 1110   05/23/20 1400  ceFEPIme (MAXIPIME) 2 g in sodium chloride 0.9 % 100 mL IVPB  Status:  Discontinued        2 g 200 mL/hr over 30 Minutes Intravenous Every 8 hours 05/23/20 1011 05/24/20 1330   05/22/20 0500  ceFEPIme (MAXIPIME) 2 g in sodium chloride 0.9 % 100 mL IVPB  Status:  Discontinued        2 g 200 mL/hr over 30 Minutes Intravenous Every 12 hours 05/21/20 1614 05/23/20 1011   05/21/20 2015  aztreonam (AZACTAM) 2 g in sodium chloride 0.9 % 100 mL IVPB        2 g 200 mL/hr over 30 Minutes Intravenous  Once 05/21/20 2010 05/21/20 2230   05/21/20 1730  vancomycin (VANCOREADY) IVPB 1750 mg/350 mL  1,750 mg 175 mL/hr over 120 Minutes Intravenous  Once 05/21/20 1722 05/21/20 2148   05/21/20 1545  ceFEPIme (MAXIPIME) 2 g in  sodium chloride 0.9 % 100 mL IVPB        2 g 200 mL/hr over 30 Minutes Intravenous  Once 05/21/20 1535 05/21/20 1704      Inpatient Medications  Scheduled Meds: . Chlorhexidine Gluconate Cloth  6 each Topical Daily  . collagenase   Topical Daily  . docusate sodium  100 mg Oral BID  . feeding supplement  237 mL Oral TID BM  . ferrous sulfate  325 mg Oral BID WC  . midodrine  10 mg Oral BID WC  . oxybutynin  5 mg Oral QHS  . pantoprazole  40 mg Oral QHS  . tamsulosin  0.4 mg Oral Daily   Continuous Infusions: . sodium chloride    . famotidine (PEPCID) IV    . ferumoxytol 510 mg (06/02/20 2158)  . sodium chloride irrigation     PRN Meds:.sodium chloride, acetaminophen, albuterol, diphenhydrAMINE, EPINEPHrine, famotidine (PEPCID) IV, methylPREDNISolone (SOLU-MEDROL) injection, [DISCONTINUED] ondansetron **OR** ondansetron (ZOFRAN) IV, oxyCODONE, Resource ThickenUp Clear   Time Spent in minutes  15  See all Orders from today for further details   [name] M.D on 06/08/2020 at 11:36 AM  To page go to [email or web address] - use universal password  [facility] -  Office  [phone]    Objective:   Vitals:   06/07/20 0735 06/07/20 1340 06/07/20 2102 06/08/20 0914  BP: 114/63 104/71 (!) 108/59 106/66  Pulse: 75 76 91 85  Resp: 14 16 20 19   Temp: 97.9 F (36.6 C) 98.7 F (37.1 C) 98.9 F (37.2 C) 98.8 F (37.1 C)  TempSrc: Oral Oral Axillary Oral  SpO2: 100% 100% 94% 95%  Weight:      Height:        Wt Readings from Last 3 Encounters:  06/02/20 106.6 kg  05/03/20 84.7 kg  04/22/20 91 kg     Intake/Output Summary (Last 24 hours) at 06/08/2020 1136 Last data filed at 06/08/2020 0914 Gross per 24 hour  Intake 477 ml  Output 800 ml  Net -323 ml     Physical Exam Gen Exam:Alert awake-not in any distress HEENT:atraumatic, normocephalic Chest: B/L clear to auscultation anteriorly CVS:S1S2 regular Abdomen:soft non tender, non distended Neurology:  Baseline quadriplegia unable to lift any of his extremities off the bed against gravity. Skin: no rash   Data Review:    CBC Recent Labs  Lab 06/02/20 0131 06/03/20 0101 06/04/20 0206 06/05/20 0136 06/07/20 0739  WBC 7.2 5.5 4.9 5.1 6.2  HGB 7.8* 7.8* 7.7* 7.7* 8.0*  HCT 24.8* 24.4* 23.0* 23.6* 24.3*  PLT 109* 119* 109* 116* 137*  MCV 92.9 91.4 90.6 90.4 92.0  MCH 29.2 29.2 30.3 29.5 30.3  MCHC 31.5 32.0 33.5 32.6 32.9  RDW 14.9 14.6 14.7 14.8 15.4    Chemistries  Recent Labs  Lab 06/02/20 0131 06/03/20 0101 06/04/20 0206 06/05/20 0136 06/07/20 0739 06/08/20 0252  NA 138 136 135 135 135 137  K 3.6 3.5 3.4* 3.4* 3.2* 3.6  CL 105 105 102 102 102 103  CO2 26 26 26 26 28 25   GLUCOSE 110* 108* 97 114* 96 77  BUN 10 7* 9 7* 6* 5*  CREATININE 0.67 0.58* 0.54* 0.46* 0.53* 0.57*  CALCIUM 7.0* 7.3* 7.3* 7.5* 7.6* 7.7*  AST 63*  --   --   --  70*  --  ALT 34  --   --   --  47*  --   ALKPHOS 75  --   --   --  80  --   BILITOT 0.5  --   --   --  0.6  --    ------------------------------------------------------------------------------------------------------------------ No results for input(s): CHOL, HDL, LDLCALC, TRIG, CHOLHDL, LDLDIRECT in the last 72 hours.  No results found for: HGBA1C ------------------------------------------------------------------------------------------------------------------ No results for input(s): TSH, T4TOTAL, T3FREE, THYROIDAB in the last 72 hours.  Invalid input(s): FREET3 ------------------------------------------------------------------------------------------------------------------ Recent Labs    06/07/20 0032  FERRITIN 907*    Coagulation profile No results for input(s): INR, PROTIME in the last 168 hours.  Recent Labs    06/07/20 0032  DDIMER 2.96*    Cardiac Enzymes No results for input(s): CKMB, TROPONINI, MYOGLOBIN in the last 168 hours.  Invalid input(s):  CK ------------------------------------------------------------------------------------------------------------------    Component Value Date/Time   BNP 32.0 05/24/2020 0127    Micro Results Recent Results (from the past 240 hour(s))  Culture, blood (routine x 2)     Status: None   Collection Time: 05/30/20  4:29 PM   Specimen: BLOOD LEFT HAND  Result Value Ref Range Status   Specimen Description BLOOD LEFT HAND  Final   Special Requests   Final    BOTTLES DRAWN AEROBIC AND ANAEROBIC Blood Culture results may not be optimal due to an inadequate volume of blood received in culture bottles   Culture   Final    NO GROWTH 5 DAYS Performed at [facility], [address], [place], [place] [address]    Report Status 06/04/2020 FINAL  Final  Culture, blood (routine x 2)     Status: None   Collection Time: 05/30/20  4:32 PM   Specimen: BLOOD LEFT ARM  Result Value Ref Range Status   Specimen Description BLOOD LEFT ARM  Final   Special Requests   Final    BOTTLES DRAWN AEROBIC ONLY Blood Culture results may not be optimal due to an inadequate volume of blood received in culture bottles   Culture   Final    NO GROWTH 5 DAYS Performed at [facility], [address] [place], [place] [address]    Report Status 06/04/2020 FINAL  Final  Culture, Urine     Status: Abnormal   Collection Time: 05/30/20  6:03 PM   Specimen: Urine, Random  Result Value Ref Range Status   Specimen Description URINE, RANDOM  Final   Special Requests   Final    NONE Performed at [facility], [address], [place], [place] [address]    Culture 20,000 COLONIES/mL ACHROMOBACTER XYLOSOXIDANS (A)  Final   Report Status 06/02/2020 FINAL  Final   Organism ID, Bacteria ACHROMOBACTER XYLOSOXIDANS (A)  Final      Susceptibility   Achromobacter xylosoxidans - MIC*    CEFAZOLIN >=64 RESISTANT Resistant     GENTAMICIN >=16 RESISTANT Resistant     CIPROFLOXACIN >=4 RESISTANT Resistant     IMIPENEM  8 INTERMEDIATE Intermediate     TRIMETH/SULFA <=20 SENSITIVE Sensitive     * 20,000 COLONIES/mL ACHROMOBACTER XYLOSOXIDANS  SARS Coronavirus 2 by RT PCR (hospital order, performed in [facility] hospital lab) Nasopharyngeal Nasopharyngeal Swab     Status: Abnormal   Collection Time: 06/06/20 12:04 PM   Specimen: Nasopharyngeal Swab  Result Value Ref Range Status   SARS Coronavirus 2 POSITIVE (A) NEGATIVE Final    Comment: RESULT CALLED TO, READ  BACK BY AND VERIFIED WITH: Vernie Ammons RN [id number] 06/06/20 A BROWNING (NOTE) SARS-CoV-2 target nucleic acids are DETECTED  SARS-CoV-2 RNA is generally detectable in upper respiratory specimens  during the acute phase of infection.  Positive results are indicative  of the presence of the identified virus, but do not rule out bacterial infection or co-infection with other pathogens not detected by the test.  Clinical correlation with patient history and  other diagnostic information is necessary to determine patient infection status.  The expected result is negative.  Fact Sheet for Patients:   BoilerBrush.com.cy   Fact Sheet for Healthcare Providers:   https://pope.com/    This test is not yet approved or cleared by the [place] FDA and  has been authorized for detection and/or diagnosis of SARS-CoV-2 by FDA under an Emergency Use Authorization (EUA).  This EUA will remain in effect (meaning t his test can be used) for the duration of  the COVID-19 declaration under Section 564(b)(1) of the Act, 21 U.S.C. section 360-bbb-3(b)(1), unless the authorization is terminated or revoked sooner.  Performed at [facility], [address], [place], [place] [address]   SARS Coronavirus 2 by RT PCR (hospital order, performed in [facility] hospital lab) Nasopharyngeal Nasopharyngeal Swab     Status: Abnormal   Collection Time: 06/06/20  3:46 PM   Specimen: Nasopharyngeal Swab  Result Value Ref  Range Status   SARS Coronavirus 2 POSITIVE (A) NEGATIVE Final    Comment: RESULT CALLED TO, READ BACK BY AND VERIFIED WITH: Vernie Ammons RN 2130 06/06/20 A BROWNING (NOTE) SARS-CoV-2 target nucleic acids are DETECTED  SARS-CoV-2 RNA is generally detectable in upper respiratory specimens  during the acute phase of infection.  Positive results are indicative  of the presence of the identified virus, but do not rule out bacterial infection or co-infection with other pathogens not detected by the test.  Clinical correlation with patient history and  other diagnostic information is necessary to determine patient infection status.  The expected result is negative.  Fact Sheet for Patients:   BoilerBrush.com.cy   Fact Sheet for Healthcare Providers:   https://pope.com/    This test is not yet approved or cleared by the [place] FDA and  has been authorized for detection and/or diagnosis of SARS-CoV-2 by FDA under an Emergency Use Authorization (EUA).  This EUA will remain in effect (meaning t his test can be used) for the duration of  the COVID-19 declaration under Section 564(b)(1) of the Act, 21 U.S.C. section 360-bbb-3(b)(1), unless the authorization is terminated or revoked sooner.  Performed at [facility], [address], [place], [place] [address]     Radiology Reports DG Chest 2 View  Result Date: 05/31/2020 CLINICAL DATA:  Fever, hypertension, former smoker EXAM: CHEST - 2 VIEW COMPARISON:  05/30/2020 FINDINGS: Normal heart size, mediastinal contours, and pulmonary vascularity. RIGHT greater than LEFT basilar opacities question pneumonia versus atelectasis little changed. Small bibasilar effusions larger on RIGHT. No pneumothorax. Degenerative changes of LEFT glenohumeral joint. IMPRESSION: Persistent bibasilar opacities question atelectasis versus pneumonia with small bibasilar effusions, both greater on RIGHT.  Electronically Signed   By: Ulyses Southward M.D.   On: 05/31/2020 08:56   CT Head Wo Contrast  Result Date: 05/21/2020 CLINICAL DATA:  Mental status change. EXAM: CT HEAD WITHOUT CONTRAST TECHNIQUE: Contiguous axial images were obtained from the base of the skull through the vertex without intravenous contrast. COMPARISON:  May 03, 2020 FINDINGS: Brain: No evidence of  acute infarction, hemorrhage, hydrocephalus, extra-axial collection or mass lesion/mass effect. Mild brain parenchymal volume loss and deep white matter microangiopathy. Vascular: No hyperdense vessel or unexpected calcification. Skull: Normal. Negative for fracture or focal lesion. Sinuses/Orbits: No acute finding. Other: None. IMPRESSION: 1. No acute intracranial abnormality. 2. 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 Electronically Signed   By: Ted Mcalpineobrinka  Dimitrova M.D.   On: 05/21/2020 17:53   DG CHEST PORT 1 VIEW  Result Date: 06/06/2020 CLINICAL DATA:  Hypoxia EXAM: PORTABLE CHEST 1 VIEW COMPARISON:  05/31/2020 and prior FINDINGS: No pneumothorax. Patchy bibasilar opacities and small effusions, unchanged. Stable cardiomediastinal silhouette and osseous structures. IMPRESSION: Bibasilar opacities and small effusions, unchanged. Electronically Signed   By: Stana Buntinghikanele  Emekauwa M.D.   On: 06/06/2020 19:17   DG CHEST PORT 1 VIEW  Result Date: 05/30/2020 CLINICAL DATA:  Fever chest pain EXAM: PORTABLE CHEST 1 VIEW COMPARISON:  05/21/2020, 05/08/2020, 05/06/2020 FINDINGS: Low lung volumes with elevation of right diaphragm. Similar right greater than left basilar consolidations. Possible small pleural effusions. Stable cardiomediastinal silhouette. No pneumothorax IMPRESSION: Similar appearance of right greater than left basilar consolidations, atelectasis versus pneumonia, with possible small pleural effusions. Electronically Signed   By: Jasmine PangKim  Fujinaga M.D.   On: 05/30/2020 19:16   DG Chest Port 1 View  Result  Date: 05/21/2020 CLINICAL DATA:  Found unresponsive. EXAM: PORTABLE CHEST 1 VIEW COMPARISON:  May 08, 2020 FINDINGS: There is stable elevation of the right hemidiaphragm. Mild bilateral infrahilar atelectasis is seen. There is no evidence of a pleural effusion or pneumothorax. The heart size and mediastinal contours are within normal limits. The visualized skeletal structures are unremarkable. IMPRESSION: Mild bilateral infrahilar atelectasis. Electronically Signed   By: Aram Candelahaddeus  Houston M.D.   On: 05/21/2020 16:34   DG Swallowing Func-Speech Pathology  Result Date: 06/05/2020 Objective Swallowing Evaluation: Type of Study: MBS-Modified Barium Swallow Study  Patient Details Name: Hilaria OtaRonald Ray Longenecker MRN: [id number] Date of Birth: Jun 05, 1946 Today's Date: 06/05/2020 Time: SLP Start Time (ACUTE ONLY): 1300 -SLP Stop Time (ACUTE ONLY): 1316 SLP Time Calculation (min) (ACUTE ONLY): 16 min Past Medical History: Past Medical History: Diagnosis Date . Asthma  . 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 Newell CoralNudelman, 1992-->[facility]  Summer 2021->Cervicalgia,arm pain, hand atrophy RUE wkness, hyperreflex (Dr. 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 Hay fever  . Hypertension  . Osteoarthritis of both hands  Past Surgical History: Past Surgical History: Procedure Laterality Date . ANTERIOR CERVICAL DECOMP/DISCECTOMY FUSION N/A 04/05/2020  Procedure: ANTERIOR CERVICAL DECOMPRESSION/DISCECTOMY FUSION, INTERBODY PROSTHESIS, PLATE/SCREWS CERVICAL THREE-CERVICAL FOUR, CERVICAL FOUR- CERVICAL FIVE;  Surgeon: Tressie StalkerJenkins, Jeffrey, MD;  [facility];  Service: Neurosurgery;  Laterality: N/A; . ANTERIOR CERVICAL DECOMP/DISCECTOMY FUSION N/A 04/22/2020  Procedure: Reexploration of anterior cervical wound for epidural hematoma;  Surgeon: Donalee Citrinram, Gary, MD;  [facility];  Service:  Neurosurgery;  Laterality: N/A; . BACK SURGERY   . INCISION AND DRAINAGE Left 2012  L hand infection . ROTATOR CUFF REPAIR Right   x 3 . SPINE SURGERY   HPI: Marrian SalvageRonald Ray Smithis a 74 y.o.malewith medical history significant ofquadriplegia which follows surgery for Chiari I malformation with syringomyelia surgery in October 2021.  He originally had a C3-4  4-5 anterior cervical disc ACDF with decompression, C3 3-4 and C4-5 interbody arthrodesis with local autograft bone, anterior cervical plating of C3-C5 on 04/05/2020.  He then went  home and had a fall from his bed, he was then hospitalized and from here he was sent to a nursing home, he has been quadriplegic since then, he has also developed sacral decubitus ulcers. Was seen for MBS on 05/04/20 "deficits resulted in silent aspiration of thin liquid and nectar thick liquid by straw prior to the swallow. With nectar thick liquid by cup, only transient penetration was seen. There was significant pharyngeal residue with puree and solid consistencies which was reduced but not fully cleared with liquid wash." Now admitted for AMS due to UTI, sepsis and hypotension in a patient with indwelling Foley catheter POA and sacral decubitus ulcers POA who is quadriplegic. Pt/niece preferred to remain on nectar thick full liquids- pt stable and ST signed off. Re-ordered by hospitalist "suspicion for aspiration pna".   Subjective: alert, cooperative, pleasant, participative. Assessment / Plan / Recommendation [facility] IP CLINICAL IMPRESSIONS 06/05/2020 Clinical Impression Pt demonstrates normal swallow ability. No penetration, aspiration or residue was observed. Pt at times had mild spillage of oral residue to pharynx which he sensed and swallowed, not outside of normal limits. He is able to masticate and esophageal sweep with solids appeared WNL. Will initiate a regular diet and thin liquids to allow foods of choice though pt will need total assist feeding. Will f/u for tolerance.  SLP Visit Diagnosis Dysphagia, unspecified (R13.10) Attention and concentration deficit following -- Frontal lobe and executive function deficit following -- Impact on safety and function Mild aspiration risk;Risk for inadequate nutrition/hydration   [facility] IP TREATMENT RECOMMENDATION 06/05/2020 Treatment Recommendations Therapy as outlined in treatment plan below   Prognosis 06/05/2020 Prognosis for Safe Diet Advancement Good Barriers to Reach Goals -- Barriers/Prognosis Comment -- [facility] IP DIET RECOMMENDATION 06/05/2020 SLP Diet Recommendations Regular solids;Thin liquid Liquid Administration via Cup;Straw Medication Administration Whole meds with liquid Compensations Slow rate;Small sips/bites;Follow solids with liquid Postural Changes --   [facility] IP OTHER RECOMMENDATIONS 06/05/2020 Recommended Consults -- Oral Care Recommendations Oral care BID Other Recommendations --   [facility] IP FOLLOW UP RECOMMENDATIONS 06/05/2020 Follow up Recommendations Skilled Nursing facility   [facility] IP FREQUENCY AND DURATION 06/05/2020 Speech Therapy Frequency (ACUTE ONLY) min 2x/week Treatment Duration 2 weeks      [facility] IP ORAL PHASE 06/05/2020 Oral Phase WFL Oral - Pudding Teaspoon -- Oral - Pudding Cup -- Oral - Honey Teaspoon -- Oral - Honey Cup -- Oral - Nectar Teaspoon -- Oral - Nectar Cup -- Oral - Nectar Straw -- Oral - Thin Teaspoon -- Oral - Thin Cup -- Oral - Thin Straw -- Oral - Puree -- Oral - Mech Soft -- Oral - Regular -- Oral - Multi-Consistency -- Oral - Pill -- Oral Phase - Comment --  [facility] IP PHARYNGEAL PHASE 06/05/2020 Pharyngeal Phase WFL Pharyngeal- Pudding Teaspoon -- Pharyngeal -- Pharyngeal- Pudding Cup -- Pharyngeal -- Pharyngeal- Honey Teaspoon -- Pharyngeal -- Pharyngeal- Honey Cup -- Pharyngeal -- Pharyngeal- Nectar Teaspoon -- Pharyngeal -- Pharyngeal- Nectar Cup -- Pharyngeal -- Pharyngeal- Nectar Straw -- Pharyngeal -- Pharyngeal- Thin Teaspoon -- Pharyngeal -- Pharyngeal- Thin Cup -- Pharyngeal -- Pharyngeal- Thin  Straw -- Pharyngeal -- Pharyngeal- Puree -- Pharyngeal -- Pharyngeal- Mechanical Soft -- Pharyngeal -- Pharyngeal- Regular -- Pharyngeal -- Pharyngeal- Multi-consistency -- Pharyngeal -- Pharyngeal- Pill -- Pharyngeal -- Pharyngeal Comment --  [facility] IP CERVICAL ESOPHAGEAL PHASE 06/05/2020 Cervical Esophageal Phase -- Pudding Teaspoon -- Pudding Cup -- Honey Teaspoon -- Honey Cup -- Nectar Teaspoon -- Nectar Cup -- Nectar Straw -- Thin Teaspoon -- Thin Cup  WFL Thin Straw WFL Puree WFL Mechanical Soft WFL Regular -- Multi-consistency -- Pill WFL Cervical Esophageal Comment -- Harlon Ditty, MA CCC-SLP Acute Rehabilitation Services Pager [phone] Office [phone] Claudine Mouton 06/05/2020, 1:53 PM

## 2020-06-08 NOTE — TOC Progression Note (Signed)
Transition of Care [facility]) - Progression Note    Patient Details  Name: Dalton Molesworth MRN: [id number] Date of Birth: [birth year]  Transition of Care [facility]) CM/SW Contact  Mearl Latin, LCSW Phone Number: 06/08/2020, 2:04 PM  Clinical Narrative:    CSW attempted to call patient with no success; requested RN's assistance to let him know we are still waiting on a COVID bed to open at [facility]. CSW left voicemaill for patient's daughter to inform of the same.    Expected Discharge Plan: Skilled Nursing Facility Barriers to Discharge: Continued Medical Work up  Expected Discharge Plan and Services Expected Discharge Plan: Skilled Nursing Facility       Living arrangements for the past 2 months: Skilled Nursing Facility [facility]) Expected Discharge Date: 06/06/20                                     Social Determinants of Health (SDOH) Interventions    Readmission Risk Interventions No flowsheet data found.

## 2020-06-09 NOTE — TOC Progression Note (Signed)
Transition of Care [facility]) - Progression Note    Patient Details  Name: Xaviar Lunn MRN: [id number] Date of Birth: May 01, 1946  [facility]) CM/SW Contact  Mearl Latin, LCSW Phone Number: 06/09/2020, 10:56 AM  Clinical Narrative:    Per Sheliah Hatch, they are hopeful to have a bed open for patient on Tuesday.    Expected Discharge Plan: Skilled Nursing Facility Barriers to Discharge: Continued Medical Work up  Expected Discharge Plan and Services Expected Discharge Plan: Skilled Nursing Facility       Living arrangements for the past 2 months: Skilled Nursing Facility [facility]) Expected Discharge Date: 06/06/20                                     Social Determinants of Health (SDOH) Interventions    Readmission Risk Interventions No flowsheet data found.

## 2020-06-09 NOTE — Progress Notes (Signed)
PROGRESS NOTE                                                                                                                                                                                                             Patient Demographics:    Shannon [name], is a 74 y.o. male, DOB - 1946-06-01, VWU:[id number]  Outpatient Primary MD for the patient is McGowen, Maryjean MornPhilip H, MD   Admit date - 05/21/2020   LOS - 19  Chief Complaint  Patient presents with  . Unresponsive       Brief Narrative: 74 y.o.malewith medical history significant ofquadriplegia, Chiari I malformation with syringomyelia, ACDF in October 2021, residual quadriplegia, then had a fall at home developed epidural hematoma, underwent reexploration, removal of hardware, evacuation of hematoma on 10/30, now quadriplegic with dysphagia, unstageable sacral decubitus wounds, failure to thrive, recurrent hospitalizations. Subsequently recently hospitalized (11/10-11/18) with aspiration pneumonia/UTI-discharged to SNF. Readmitted 11/28 with septic shock, severe frank hematuria and UTI treated with antibiotics, continuous bladder irrigation-stabilized-plans were for discharge to SNF on 12/14 however ended up with Covid positive test.  COVID-19 vaccinated status: Vaccinated  Significant studies: 11/28>> CT head: No acute intracranial abnormality. 11/28>> chest x-ray: Mild bilateral infrahilar atelectasis. 12/7>> chest x-ray: Right greater than left bibasilar consolidations. 12/8>> chest x-ray: Persistent bibasilar opacities right greater than left. 12/14>> chest x-ray: Unchanged bibasilar opacities.  COVID-19 medications: 12/14>> monoclonal antibody x1  Antibiotics: Cefepime: 12/8>> 12/14 Ceftriaxone: 12/1>> 12/8 Cefepime: 11/28>> 11/30 Vancomycin: 11/28 x 1 Aztreonam: 11/28 x 1  Microbiology data: 11/28>> blood culture: No growth 11/28>> urine culture: No  growth 12/7>> blood culture: No growth 12/7>> urine culture: Achromobacter Xylosoxidans  Procedures: None  Consults: Palliative care  DVT prophylaxis: enoxaparin (LOVENOX) injection 40 mg Start: 06/08/20 1230 Place and maintain sequential compression device Start: 05/23/20 1640 SCDs  Restart SQ Lovenox 12/16-watch closely for hematuria.  Note-previously on Eliquis for VTE prophylaxis-has been discontinued due to development of hematuria.    Subjective:   No major issues overnight-lying comfortably in bed-amber-colored urine in Foley bag.   Assessment  & Plan :   COVID-19 infection: Asymptomatic-treated with MAB-apart from observation-does not require any further treatment at this point.  Septic shock due to UTI/aspiration pneumonia: Sepsis physiology has resolved-has completed a course of antimicrobial therapy  Acute metabolic encephalopathy: Due to sepsis/UTI-resolved.  Aspiration pneumonia/dysphagia:  Stable-no new pneumonia symptoms-SLP following-upgraded to a regular diet with thin liquids.  Remains at risk for future decompensations and aspirations-given quadriplegia/deconditioning and cervical myelopathy at baseline.  Quadriplegia with dysphagia-cervical myelopathy-s/p C-spine surgery on 10/13-subsequently sustained a fall at home and developed epidural hematoma-underwent reexploration/removal of hardware and evacuation of hematoma on 10/30.  Per prior documentation-plan is to leave cervical collar for 3 months total-till mid January.  Continues to have significant quadriplegia (able to move upper extremity side-by-side-unable to lift off the bed against gravity, able to only wiggle toes-unable to lift lower extremities off the bed against gravity)  Hematuria: Resolved.  Etiology felt to be UTI/trauma due to Foley catheter related urethral injury.  Required CBI-that has since been discontinued.  Evaluated by urology with recommendations to continue with Foley catheter on  discharge.   HTN: BP remains stable on midodrine all BP medications on hold  Multifactorial anemia-anemia of chronic disease/vitamin B12 deficiency/borderline iron deficiency/acute blood loss due to hematuria: Hemoglobin relatively stable-s/p 2 units of PRBC this admission.  Follow CBC periodically.  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  Chronic sacral decubitus ulcers NFA:OZHYQ sacral decubitus wound which is unstageable. General surgery was consulted., no debridement needed, was getting hydrotherapy, transitioned back to daily dressing changes  Palliative care/goals of care: Remains a full code-appreciate palliative care evaluation this admission.  Patient's overall prognosis remains poor-he is at significant risk for repeated hospitalizations due to recurrent aspirations pneumonia/UTI/sacral decubitus ulcer infection-which potentially could be life disabling or life-threatening.  Patient remains a full code-family wants to continue with full scope of treatment at this point.  Severe protein calorie malnutrition:Withseverecachexia weight loss.Supplements encouraged, now diet advanced  Obesity: Estimated body mass index is 31.87 kg/m as calculated from the following:   Height as of this encounter: 6' (1.829 m).   Weight as of this encounter: 106.6 kg.   RN pressure injury documentation: Pressure Injury 05/03/20 Sacrum Medial Unstageable - Full thickness tissue loss in which the base of the injury is covered by slough (yellow, tan, gray, green or brown) and/or eschar (tan, brown or black) in the wound bed. (Active)  05/03/20 2023  Location: Sacrum  Location Orientation: Medial  Staging: Unstageable - Full thickness tissue loss in which the base of the injury is covered by slough (yellow, tan, gray, green or brown) and/or eschar (tan, brown or black) in the wound bed.  Wound Description  (Comments):   Present on Admission: Yes     Pressure Injury 05/22/20 Other (Comment) Right Stage 2 -  Partial thickness loss of dermis presenting as a shallow open injury with a red, pink wound bed without slough. pre-existing, dressing from nursing home on wound.  (Active)  05/22/20 0700  Location: Other (Comment) (clavicle on right)  Location Orientation: Right  Staging: Stage 2 -  Partial thickness loss of dermis presenting as a shallow open injury with a red, pink wound bed without slough.  Wound Description (Comments): pre-existing, dressing from nursing home on wound.   Present on Admission: Yes     Pressure Injury 05/23/20 Sacrum Unstageable - Full thickness tissue loss in which the base of the injury is covered by slough (yellow, tan, gray, green or brown) and/or eschar (tan, brown or black) in the wound bed. (Active)  05/23/20 1216  Location: Sacrum  Location Orientation:   Staging: Unstageable - Full thickness tissue loss in which the base of the injury is covered by slough (  yellow, tan, gray, green or brown) and/or eschar (tan, brown or black) in the wound bed.  Wound Description (Comments):   Present on Admission: Yes     Pressure Injury 06/07/20 Throat Right Stage 3 -  Full thickness tissue loss. Subcutaneous fat may be visible but bone, tendon or muscle are NOT exposed. (Active)  06/07/20 0758  Location: Throat  Location Orientation: Right  Staging: Stage 3 -  Full thickness tissue loss. Subcutaneous fat may be visible but bone, tendon or muscle are NOT exposed.  Wound Description (Comments):   Present on Admission:     GI prophylaxis: PPI  ABG:    Component Value Date/Time   PHART 7.504 (H) 05/03/2020 2303   PCO2ART 34.2 05/03/2020 2303   PO2ART 66.5 (L) 05/03/2020 2303   HCO3 31.4 (H) 05/21/2020 1544   TCO2 33 (H) 05/21/2020 1544   O2SAT 79.0 05/21/2020 1544    Vent Settings: N/A   Condition -Stable  Family Communication  :  Daughter [name]  [phone]) left voicemail on 12/16  Code Status :  Full Code  Diet :  Diet Order            Diet regular Room service appropriate? Yes with Assist; Fluid consistency: Thin  Diet effective now                  Disposition Plan  :   Status is: Inpatient  Remains inpatient appropriate because:Inpatient level of care appropriate due to severity of illness   Dispo: The patient is from: SNF              Anticipated d/c is to: SNF              Anticipated d/c date is: 1 day              Patient currently is medically stable to d/c.   Barriers to discharge: Awaiting SNF bed-Covid positive status.  Antimicorbials  :    Anti-infectives (From admission, onward)   Start     Dose/Rate Route Frequency Ordered Stop   06/06/20 1000  ceFEPIme (MAXIPIME) 2 g in sodium chloride 0.9 % 100 mL IVPB        2 g 200 mL/hr over 30 Minutes Intravenous Every 8 hours 06/06/20 0242 06/07/20 2233   05/31/20 1400  ceFEPIme (MAXIPIME) 2 g in sodium chloride 0.9 % 100 mL IVPB  Status:  Discontinued        2 g 200 mL/hr over 30 Minutes Intravenous Every 8 hours 05/31/20 1240 06/06/20 0242   05/24/20 1415  cefTRIAXone (ROCEPHIN) 1 g in sodium chloride 0.9 % 100 mL IVPB        1 g 200 mL/hr over 30 Minutes Intravenous Every 24 hours 05/24/20 1330 05/31/20 1110   05/23/20 1400  ceFEPIme (MAXIPIME) 2 g in sodium chloride 0.9 % 100 mL IVPB  Status:  Discontinued        2 g 200 mL/hr over 30 Minutes Intravenous Every 8 hours 05/23/20 1011 05/24/20 1330   05/22/20 0500  ceFEPIme (MAXIPIME) 2 g in sodium chloride 0.9 % 100 mL IVPB  Status:  Discontinued        2 g 200 mL/hr over 30 Minutes Intravenous Every 12 hours 05/21/20 1614 05/23/20 1011   05/21/20 2015  aztreonam (AZACTAM) 2 g in sodium chloride 0.9 % 100 mL IVPB        2 g 200 mL/hr over 30 Minutes Intravenous  Once 05/21/20 2010 05/21/20 2230  05/21/20 1730  vancomycin (VANCOREADY) IVPB 1750 mg/350 mL        1,750 mg 175 mL/hr over 120 Minutes  Intravenous  Once 05/21/20 1722 05/21/20 2148   05/21/20 1545  ceFEPIme (MAXIPIME) 2 g in sodium chloride 0.9 % 100 mL IVPB        2 g 200 mL/hr over 30 Minutes Intravenous  Once 05/21/20 1535 05/21/20 1704      Inpatient Medications  Scheduled Meds: . (feeding supplement) PROSource Plus  30 mL Oral TID BM  . Chlorhexidine Gluconate Cloth  6 each Topical Daily  . collagenase   Topical Daily  . docusate sodium  100 mg Oral BID  . enoxaparin (LOVENOX) injection  40 mg Subcutaneous Q24H  . feeding supplement  237 mL Oral QID  . ferrous sulfate  325 mg Oral BID WC  . midodrine  10 mg Oral BID WC  . oxybutynin  5 mg Oral QHS  . pantoprazole  40 mg Oral QHS  . tamsulosin  0.4 mg Oral Daily   Continuous Infusions: . ferumoxytol 510 mg (06/02/20 2158)  . sodium chloride irrigation     PRN Meds:.acetaminophen, [DISCONTINUED] ondansetron **OR** ondansetron (ZOFRAN) IV, oxyCODONE, Resource ThickenUp Clear   Time Spent in minutes  15  See all Orders from today for further details   [name] M.D on 06/09/2020 at 1:50 PM  To page go to [email or web address] - use universal password  [facility] -  Office  [phone]    Objective:   Vitals:   06/08/20 0914 06/08/20 2105 06/09/20 0456 06/09/20 0725  BP: 106/66 100/61 106/69 113/66  Pulse: 85 97 86 86  Resp: 19 20 19 16   Temp: 98.8 F (37.1 C) 97.7 F (36.5 C) 99 F (37.2 C) 98.7 F (37.1 C)  TempSrc: Oral Oral Oral Oral  SpO2: 95% 96% 94% 95%  Weight:      Height:        Wt Readings from Last 3 Encounters:  06/02/20 106.6 kg  05/03/20 84.7 kg  04/22/20 91 kg     Intake/Output Summary (Last 24 hours) at 06/09/2020 1350 Last data filed at 06/09/2020 1344 Gross per 24 hour  Intake 25 ml  Output 600 ml  Net -575 ml     Physical Exam Gen Exam:Alert awake-not in any distress HEENT:atraumatic, normocephalic Chest: B/L clear to auscultation anteriorly CVS:S1S2 regular Abdomen:soft non tender, non  distended Neurology: Baseline quadriplegia unable to lift any of his extremities off the bed against gravity. Skin: no rash   Data Review:    CBC Recent Labs  Lab 06/03/20 0101 06/04/20 0206 06/05/20 0136 06/07/20 0739  WBC 5.5 4.9 5.1 6.2  HGB 7.8* 7.7* 7.7* 8.0*  HCT 24.4* 23.0* 23.6* 24.3*  PLT 119* 109* 116* 137*  MCV 91.4 90.6 90.4 92.0  MCH 29.2 30.3 29.5 30.3  MCHC 32.0 33.5 32.6 32.9  RDW 14.6 14.7 14.8 15.4    Chemistries  Recent Labs  Lab 06/03/20 0101 06/04/20 0206 06/05/20 0136 06/07/20 0739 06/08/20 0252  NA 136 135 135 135 137  K 3.5 3.4* 3.4* 3.2* 3.6  CL 105 102 102 102 103  CO2 26 26 26 28 25   GLUCOSE 108* 97 114* 96 77  BUN 7* 9 7* 6* 5*  CREATININE 0.58* 0.54* 0.46* 0.53* 0.57*  CALCIUM 7.3* 7.3* 7.5* 7.6* 7.7*  AST  --   --   --  70*  --   ALT  --   --   --  47*  --   ALKPHOS  --   --   --  80  --   BILITOT  --   --   --  0.6  --    ------------------------------------------------------------------------------------------------------------------ No results for input(s): CHOL, HDL, LDLCALC, TRIG, CHOLHDL, LDLDIRECT in the last 72 hours.  No results found for: HGBA1C ------------------------------------------------------------------------------------------------------------------ No results for input(s): TSH, T4TOTAL, T3FREE, THYROIDAB in the last 72 hours.  Invalid input(s): FREET3 ------------------------------------------------------------------------------------------------------------------ Recent Labs    06/07/20 0032  FERRITIN 907*    Coagulation profile No results for input(s): INR, PROTIME in the last 168 hours.  Recent Labs    06/07/20 0032  DDIMER 2.96*    Cardiac Enzymes No results for input(s): CKMB, TROPONINI, MYOGLOBIN in the last 168 hours.  Invalid input(s): CK ------------------------------------------------------------------------------------------------------------------    Component Value Date/Time    BNP 32.0 05/24/2020 0127    Micro Results Recent Results (from the past 240 hour(s))  Culture, blood (routine x 2)     Status: None   Collection Time: 05/30/20  4:29 PM   Specimen: BLOOD LEFT HAND  Result Value Ref Range Status   Specimen Description BLOOD LEFT HAND  Final   Special Requests   Final    BOTTLES DRAWN AEROBIC AND ANAEROBIC Blood Culture results may not be optimal due to an inadequate volume of blood received in culture bottles   Culture   Final    NO GROWTH 5 DAYS Performed at [facility], [address] [place], [place] [address]    Report Status 06/04/2020 FINAL  Final  Culture, blood (routine x 2)     Status: None   Collection Time: 05/30/20  4:32 PM   Specimen: BLOOD LEFT ARM  Result Value Ref Range Status   Specimen Description BLOOD LEFT ARM  Final   Special Requests   Final    BOTTLES DRAWN AEROBIC ONLY Blood Culture results may not be optimal due to an inadequate volume of blood received in culture bottles   Culture   Final    NO GROWTH 5 DAYS Performed at [facility], [address], [place], [place] [address]    Report Status 06/04/2020 FINAL  Final  Culture, Urine     Status: Abnormal   Collection Time: 05/30/20  6:03 PM   Specimen: Urine, Random  Result Value Ref Range Status   Specimen Description URINE, RANDOM  Final   Special Requests   Final    NONE Performed at [facility], [address] [place], [place] [address]    Culture 20,000 COLONIES/mL ACHROMOBACTER XYLOSOXIDANS (A)  Final   Report Status 06/02/2020 FINAL  Final   Organism ID, Bacteria ACHROMOBACTER XYLOSOXIDANS (A)  Final      Susceptibility   Achromobacter xylosoxidans - MIC*    CEFAZOLIN >=64 RESISTANT Resistant     GENTAMICIN >=16 RESISTANT Resistant     CIPROFLOXACIN >=4 RESISTANT Resistant     IMIPENEM 8 INTERMEDIATE Intermediate     TRIMETH/SULFA <=20 SENSITIVE Sensitive     * 20,000 COLONIES/mL ACHROMOBACTER XYLOSOXIDANS  SARS Coronavirus 2 by RT  PCR (hospital order, performed in [facility] hospital lab) Nasopharyngeal Nasopharyngeal Swab     Status: Abnormal   Collection Time: 06/06/20 12:04 PM   Specimen: Nasopharyngeal Swab  Result Value Ref Range Status   SARS Coronavirus 2 POSITIVE (A) NEGATIVE Final    Comment: RESULT CALLED TO, READ BACK BY AND VERIFIED WITH: Vernie Ammons RN [id number] 06/06/20 A BROWNING (NOTE)  SARS-CoV-2 target nucleic acids are DETECTED  SARS-CoV-2 RNA is generally detectable in upper respiratory specimens  during the acute phase of infection.  Positive results are indicative  of the presence of the identified virus, but do not rule out bacterial infection or co-infection with other pathogens not detected by the test.  Clinical correlation with patient history and  other diagnostic information is necessary to determine patient infection status.  The expected result is negative.  Fact Sheet for Patients:   BoilerBrush.com.cy   Fact Sheet for Healthcare Providers:   https://pope.com/    This test is not yet approved or cleared by the [place] FDA and  has been authorized for detection and/or diagnosis of SARS-CoV-2 by FDA under an Emergency Use Authorization (EUA).  This EUA will remain in effect (meaning t his test can be used) for the duration of  the COVID-19 declaration under Section 564(b)(1) of the Act, 21 U.S.C. section 360-bbb-3(b)(1), unless the authorization is terminated or revoked sooner.  Performed at [facility], [address], [place], [place] [address]   SARS Coronavirus 2 by RT PCR (hospital order, performed in [facility] hospital lab) Nasopharyngeal Nasopharyngeal Swab     Status: Abnormal   Collection Time: 06/06/20  3:46 PM   Specimen: Nasopharyngeal Swab  Result Value Ref Range Status   SARS Coronavirus 2 POSITIVE (A) NEGATIVE Final    Comment: RESULT CALLED TO, READ BACK BY AND VERIFIED WITH: Vernie Ammons RN [id number]  06/06/20 A BROWNING (NOTE) SARS-CoV-2 target nucleic acids are DETECTED  SARS-CoV-2 RNA is generally detectable in upper respiratory specimens  during the acute phase of infection.  Positive results are indicative  of the presence of the identified virus, but do not rule out bacterial infection or co-infection with other pathogens not detected by the test.  Clinical correlation with patient history and  other diagnostic information is necessary to determine patient infection status.  The expected result is negative.  Fact Sheet for Patients:   BoilerBrush.com.cy   Fact Sheet for Healthcare Providers:   https://pope.com/    This test is not yet approved or cleared by the [place] FDA and  has been authorized for detection and/or diagnosis of SARS-CoV-2 by FDA under an Emergency Use Authorization (EUA).  This EUA will remain in effect (meaning t his test can be used) for the duration of  the COVID-19 declaration under Section 564(b)(1) of the Act, 21 U.S.C. section 360-bbb-3(b)(1), unless the authorization is terminated or revoked sooner.  Performed at [facility], [address] [place], [place] [address]     Radiology Reports DG Chest 2 View  Result Date: 05/31/2020 CLINICAL DATA:  Fever, hypertension, former smoker EXAM: CHEST - 2 VIEW COMPARISON:  05/30/2020 FINDINGS: Normal heart size, mediastinal contours, and pulmonary vascularity. RIGHT greater than LEFT basilar opacities question pneumonia versus atelectasis little changed. Small bibasilar effusions larger on RIGHT. No pneumothorax. Degenerative changes of LEFT glenohumeral joint. IMPRESSION: Persistent bibasilar opacities question atelectasis versus pneumonia with small bibasilar effusions, both greater on RIGHT. Electronically Signed   By: Ulyses Southward M.D.   On: 05/31/2020 08:56   CT Head Wo Contrast  Result Date: 05/21/2020 CLINICAL DATA:  Mental status  change. EXAM: CT HEAD WITHOUT CONTRAST TECHNIQUE: Contiguous axial images were obtained from the base of the skull through the vertex without intravenous contrast. COMPARISON:  May 03, 2020 FINDINGS: Brain: No evidence of acute infarction, hemorrhage, hydrocephalus, extra-axial collection or mass lesion/mass effect. Mild brain parenchymal  volume loss and deep white matter microangiopathy. Vascular: No hyperdense vessel or unexpected calcification. Skull: Normal. Negative for fracture or focal lesion. Sinuses/Orbits: No acute finding. Other: None. IMPRESSION: 1. No acute intracranial abnormality. 2. 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 Electronically Signed   By: Ted Mcalpine M.D.   On: 05/21/2020 17:53   DG CHEST PORT 1 VIEW  Result Date: 06/06/2020 CLINICAL DATA:  Hypoxia EXAM: PORTABLE CHEST 1 VIEW COMPARISON:  05/31/2020 and prior FINDINGS: No pneumothorax. Patchy bibasilar opacities and small effusions, unchanged. Stable cardiomediastinal silhouette and osseous structures. IMPRESSION: Bibasilar opacities and small effusions, unchanged. Electronically Signed   By: Stana Bunting M.D.   On: 06/06/2020 19:17   DG CHEST PORT 1 VIEW  Result Date: 05/30/2020 CLINICAL DATA:  Fever chest pain EXAM: PORTABLE CHEST 1 VIEW COMPARISON:  05/21/2020, 05/08/2020, 05/06/2020 FINDINGS: Low lung volumes with elevation of right diaphragm. Similar right greater than left basilar consolidations. Possible small pleural effusions. Stable cardiomediastinal silhouette. No pneumothorax IMPRESSION: Similar appearance of right greater than left basilar consolidations, atelectasis versus pneumonia, with possible small pleural effusions. Electronically Signed   By: Jasmine Pang M.D.   On: 05/30/2020 19:16   DG Chest Port 1 View  Result Date: 05/21/2020 CLINICAL DATA:  Found unresponsive. EXAM: PORTABLE CHEST 1 VIEW COMPARISON:  May 08, 2020 FINDINGS: There is stable elevation  of the right hemidiaphragm. Mild bilateral infrahilar atelectasis is seen. There is no evidence of a pleural effusion or pneumothorax. The heart size and mediastinal contours are within normal limits. The visualized skeletal structures are unremarkable. IMPRESSION: Mild bilateral infrahilar atelectasis. Electronically Signed   By: Aram Candela M.D.   On: 05/21/2020 16:34   DG Swallowing Func-Speech Pathology  Result Date: 06/05/2020 Objective Swallowing Evaluation: Type of Study: MBS-Modified Barium Swallow Study  Patient Details Name: Jawuan Robb MRN: [id number] Date of Birth: December 20, 1945 Today's Date: 06/05/2020 Time: SLP Start Time (ACUTE ONLY): 1300 -SLP Stop Time (ACUTE ONLY): 1316 SLP Time Calculation (min) (ACUTE ONLY): 16 min Past Medical History: Past Medical History: Diagnosis Date . Asthma  . 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 Newell Coral, 1992-->surg at [facility].  Summer 2021->Cervicalgia,arm pain, hand atrophy RUE wkness, hyperreflex (Dr. 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 Hay fever  . Hypertension  . Osteoarthritis of both hands  Past Surgical History: Past Surgical History: Procedure Laterality Date . ANTERIOR CERVICAL DECOMP/DISCECTOMY FUSION N/A 04/05/2020  Procedure: ANTERIOR CERVICAL DECOMPRESSION/DISCECTOMY FUSION, INTERBODY PROSTHESIS, PLATE/SCREWS CERVICAL THREE-CERVICAL FOUR, CERVICAL FOUR- CERVICAL FIVE;  Surgeon: Tressie Stalker, MD;  Location: [facility] OR;  Service: Neurosurgery;  Laterality: N/A; . ANTERIOR CERVICAL DECOMP/DISCECTOMY FUSION N/A 04/22/2020  Procedure: Reexploration of anterior cervical wound for epidural hematoma;  Surgeon: Donalee Citrin, MD;  Location: [facility] OR;  Service: Neurosurgery;  Laterality: N/A; . BACK SURGERY   . INCISION AND DRAINAGE Left 2012  L hand infection . ROTATOR CUFF REPAIR Right   x 3 . SPINE SURGERY   HPI:  Asia Favata Smithis a 74 y.o.malewith medical history significant ofquadriplegia which follows surgery for Chiari I malformation with syringomyelia surgery in October 2021.  He originally had a C3-4 [place] 4-5 anterior cervical disc ACDF with decompression, C3 3-4 and C4-5 interbody arthrodesis with local autograft bone, anterior cervical plating of C3-C5 on 04/05/2020.  He then went home and had a fall from his bed, he was then hospitalized and  from here he was sent to a nursing home, he has been quadriplegic since then, he has also developed sacral decubitus ulcers. Was seen for MBS on 05/04/20 "deficits resulted in silent aspiration of thin liquid and nectar thick liquid by straw prior to the swallow. With nectar thick liquid by cup, only transient penetration was seen. There was significant pharyngeal residue with puree and solid consistencies which was reduced but not fully cleared with liquid wash." Now admitted for AMS due to UTI, sepsis and hypotension in a patient with indwelling Foley catheter POA and sacral decubitus ulcers POA who is quadriplegic. Pt/niece preferred to remain on nectar thick full liquids- pt stable and ST signed off. Re-ordered by hospitalist "suspicion for aspiration pna".   Subjective: alert, cooperative, pleasant, participative. Assessment / Plan / Recommendation CHL IP CLINICAL IMPRESSIONS 06/05/2020 Clinical Impression Pt demonstrates normal swallow ability. No penetration, aspiration or residue was observed. Pt at times had mild spillage of oral residue to pharynx which he sensed and swallowed, not outside of normal limits. He is able to masticate and esophageal sweep with solids appeared WNL. Will initiate a regular diet and thin liquids to allow foods of choice though pt will need total assist feeding. Will f/u for tolerance. SLP Visit Diagnosis Dysphagia, unspecified (R13.10) Attention and concentration deficit following -- Frontal lobe and executive function deficit following --  Impact on safety and function Mild aspiration risk;Risk for inadequate nutrition/hydration   [facility] IP TREATMENT RECOMMENDATION 06/05/2020 Treatment Recommendations Therapy as outlined in treatment plan below   Prognosis 06/05/2020 Prognosis for Safe Diet Advancement Good Barriers to Reach Goals -- Barriers/Prognosis Comment -- [facility] IP DIET RECOMMENDATION 06/05/2020 SLP Diet Recommendations Regular solids;Thin liquid Liquid Administration via Cup;Straw Medication Administration Whole meds with liquid Compensations Slow rate;Small sips/bites;Follow solids with liquid Postural Changes --   [facility] IP OTHER RECOMMENDATIONS 06/05/2020 Recommended Consults -- Oral Care Recommendations Oral care BID Other Recommendations --   [facility] IP FOLLOW UP RECOMMENDATIONS 06/05/2020 Follow up Recommendations Skilled Nursing facility   [facility] IP FREQUENCY AND DURATION 06/05/2020 Speech Therapy Frequency (ACUTE ONLY) min 2x/week Treatment Duration 2 weeks      CHL IP ORAL PHASE 06/05/2020 Oral Phase WFL Oral - Pudding Teaspoon -- Oral - Pudding Cup -- Oral - Honey Teaspoon -- Oral - Honey Cup -- Oral - Nectar Teaspoon -- Oral - Nectar Cup -- Oral - Nectar Straw -- Oral - Thin Teaspoon -- Oral - Thin Cup -- Oral - Thin Straw -- Oral - Puree -- Oral - Mech Soft -- Oral - Regular -- Oral - Multi-Consistency -- Oral - Pill -- Oral Phase - Comment --  CHL IP PHARYNGEAL PHASE 06/05/2020 Pharyngeal Phase WFL Pharyngeal- Pudding Teaspoon -- Pharyngeal -- Pharyngeal- Pudding Cup -- Pharyngeal -- Pharyngeal- Honey Teaspoon -- Pharyngeal -- Pharyngeal- Honey Cup -- Pharyngeal -- Pharyngeal- Nectar Teaspoon -- Pharyngeal -- Pharyngeal- Nectar Cup -- Pharyngeal -- Pharyngeal- Nectar Straw -- Pharyngeal -- Pharyngeal- Thin Teaspoon -- Pharyngeal -- Pharyngeal- Thin Cup -- Pharyngeal -- Pharyngeal- Thin Straw -- Pharyngeal -- Pharyngeal- Puree -- Pharyngeal -- Pharyngeal- Mechanical Soft -- Pharyngeal -- Pharyngeal- Regular -- Pharyngeal -- Pharyngeal-  Multi-consistency -- Pharyngeal -- Pharyngeal- Pill -- Pharyngeal -- Pharyngeal Comment --  CHL IP CERVICAL ESOPHAGEAL PHASE 06/05/2020 Cervical Esophageal Phase -- Pudding Teaspoon -- Pudding Cup -- Honey Teaspoon -- Honey Cup -- Nectar Teaspoon -- Nectar Cup -- Nectar Straw -- Thin Teaspoon -- Thin Cup WFL Thin Straw WFL Puree WFL Mechanical Soft WFL Regular -- Multi-consistency --  Pill WFL Cervical Esophageal Comment -- Harlon Ditty, MA CCC-SLP Acute Rehabilitation Services Pager [phone] Office [phone] Claudine Mouton 06/05/2020, 1:53 PM

## 2020-06-09 NOTE — Progress Notes (Signed)
Occupational Therapy Treatment Patient Details Name: Shannon [name] MRN: [id number] DOB: April 29, 1946 Today's Date: 06/09/2020    History of present illness Pt is a 74 y.o. male with PMH significant of quadriplegia (2007), Chiari I malformation with syringomyelia, s/p C3-4 and C4-5 anterior cervical discectomy and decompression (04/05/2020), recent fall at home w/ epidural hematoma s/p reexploration, hardware removal and hematoma evacuation on 04/22/20, now quadriplegic with dysphagia, unstageable sacral decubitus wounds, failure to thrive, recurrent hospitalizations; now readmitted 11/28 with septic shock, severe frank hematuria and UTI. 12/14 found to be incidentally COVID+   OT comments  Pt seen for OT follow up session with focus on OOB mobility progression. Attempted to use maximove this session. Pt required max-total A +2 to roll for pad placement. Pt was placed in chair with maximove, then yelling out in pain in his sacrum. This pain relieved when he was suspended back in the maximove. Pt then incontinent of bowels, so he was returned to bed and required total A for peri hygiene. Pt was able to alert staff he had used the bathroom. Cervical pads placed and old pads washed. D/c recs remain appropriate, will continue to follow.   Follow Up Recommendations  SNF;Supervision/Assistance - 24 hour    Equipment Recommendations  None recommended by OT    Recommendations for Other Services      Precautions / Restrictions Precautions Precautions: Fall;Cervical Required Braces or Orthoses: Cervical Brace Cervical Brace: Hard collar;At all times Restrictions Other Position/Activity Restrictions: Per prior documentation, "plan to leave cervical collar for 3 months total, until mid-January"       Mobility Bed Mobility Overal bed mobility: Needs Assistance Bed Mobility: Rolling Rolling: Max assist;+2 for physical assistance;Total assist         General bed mobility comments: Performed  multiple rolls R/L for pad placement, pericare and repositioning; max-totalA+2  Transfers Overall transfer level: Needs assistance Equipment used: Ambulation equipment used             General transfer comment: Maximove lift to recliner, pt tolerated lift well but unable to tolerate sitting in recliner seat with sacral wound despite use of geomat; also with bowel incontinence; returned to bed    Balance                                           ADL either performed or assessed with clinical judgement   ADL Overall ADL's : Needs assistance/impaired                                       General ADL Comments: Pt seen in attempts to lift to chair for time OOB and pressure relief. Pt not able to tolerate sitting up due to pain. Then required total A for peri care     Vision Patient Visual Report: No change from baseline Vision Assessment?: No apparent visual deficits   Perception     Praxis      Cognition Arousal/Alertness: Awake/alert Behavior During Therapy: Flat affect Overall Cognitive Status: No family/caregiver present to determine baseline cognitive functioning Area of Impairment: Attention;Memory;Following commands;Safety/judgement;Awareness;Problem solving                   Current Attention Level: Sustained Memory: Decreased short-term memory;Decreased recall of precautions Following Commands: Follows one step commands consistently Safety/Judgement:  Decreased awareness of safety;Decreased awareness of deficits Awareness: Emergent Problem Solving: Slow processing;Decreased initiation;Difficulty sequencing;Requires verbal cues;Requires tactile cues General Comments: Pt with poor awarenss. After back in bed, asking if he was sitting up in chair. Then speaking about how he is "sitting on a bike seat"        Exercises     Shoulder Instructions       General Comments Cervical pads replaced and old ones cleaned;  postitioned to encourage neutral BLE rotation and bilateral heel elevation with DF    Pertinent Vitals/ Pain       Pain Assessment: Faces Faces Pain Scale: Hurts even more Pain Location: Sacral wound with attempts to sit in chair; neck with rolling Pain Descriptors / Indicators: Grimacing;Aching;Moaning Pain Intervention(s): Limited activity within patient's tolerance;Monitored during session;Repositioned  Home Living                                          Prior Functioning/Environment              Frequency  Min 2X/week        Progress Toward Goals  OT Goals(current goals can now be found in the care plan section)  Progress towards OT goals: Progressing toward goals  Acute Rehab OT Goals Patient Stated Goal: To walk again OT Goal Formulation: With patient Time For Goal Achievement: 06/09/20 Potential to Achieve Goals: Fair  Plan Discharge plan remains appropriate;Frequency remains appropriate    Co-evaluation    PT/OT/SLP Co-Evaluation/Treatment: Yes Reason for Co-Treatment: Complexity of the patient's impairments (multi-system involvement);For patient/therapist safety;To address functional/ADL transfers PT goals addressed during session: Mobility/safety with mobility OT goals addressed during session: ADL's and self-care;Proper use of Adaptive equipment and DME      AM-PAC OT "6 Clicks" Daily Activity     Outcome Measure     Help from another person taking care of personal grooming?: A Lot Help from another person toileting, which includes using toliet, bedpan, or urinal?: Total Help from another person bathing (including washing, rinsing, drying)?: Total Help from another person to put on and taking off regular upper body clothing?: Total Help from another person to put on and taking off regular lower body clothing?: Total 6 Click Score: 6    End of Session Equipment Utilized During Treatment: Cervical collar  OT Visit Diagnosis:  Other abnormalities of gait and mobility (R26.89);Muscle weakness (generalized) (M62.81);Pain Pain - part of body:  (neck/sacrum)   Activity Tolerance Patient tolerated treatment well   Patient Left in bed;with call bell/phone within reach   Nurse Communication Mobility status        Time: 1610-9604 OT Time Calculation (min): 47 min  Charges: OT General Charges $OT Visit: 1 Visit OT Treatments $Therapeutic Activity: 8-22 mins  Dalphine Handing, MSOT, OTR/L Acute Rehabilitation Services [facility] Office Number: [phone] Pager: [phone]  Dalphine Handing 06/09/2020, 4:54 PM

## 2020-06-09 NOTE — Progress Notes (Signed)
Physical Therapy Treatment Patient Details Name: Shannon [name] MRN: [id number] DOB: 19-May-1946 Today's Date: 06/09/2020    History of Present Illness Pt is a 74 y.o. male with PMH significant of quadriplegia (2007), Chiari I malformation with syringomyelia, s/p C3-4 and C4-5 anterior cervical discectomy and decompression (04/05/2020), recent fall at home w/ epidural hematoma s/p reexploration, hardware removal and hematoma evacuation on 04/22/20, now quadriplegic with dysphagia, unstageable sacral decubitus wounds, failure to thrive, recurrent hospitalizations; now readmitted 11/28 with septic shock, severe frank hematuria and UTI. 12/14 found to be incidentally COVID+   PT Comments    Trialled maximove lift to recliner this session. Pt tolerated mutliple rolls R/L and repositioning in bed with max-totalA+2. Unfortunately, pt unable to tolerate sitting in recliner due to pain with sacral wound despite attempts for pressure relief with geomat; also limited by bowel incontinence, requiring return to supine. Repositioned to encourage BLE neutral alignment, bilateral heel pressure relief and R-side sacral pressure relief. Based on pt's decreased activity tolerance, will decrease PT frequency to 1x/wk. Continue to recommend SNF.   Follow Up Recommendations  SNF     Equipment Recommendations  Hospital bed;Hoyer lift;Air mattress    Recommendations for Other Services       Precautions / Restrictions Precautions Precautions: Fall;Cervical Required Braces or Orthoses: Cervical Brace Cervical Brace: Hard collar;At all times Restrictions Other Position/Activity Restrictions: Per prior documentation, "plan to leave cervical collar for 3 months total, until mid-January"    Mobility  Bed Mobility Overal bed mobility: Needs Assistance Bed Mobility: Rolling Rolling: Max assist;+2 for physical assistance;Total assist         General bed mobility comments: Performed multiple rolls R/L for  pad placement, pericare and repositioning; max-totalA+2  Transfers Overall transfer level: Needs assistance Equipment used: Ambulation equipment used             General transfer comment: Maximove lift to recliner, pt tolerated lift well but unable to tolerate sitting in recliner seat with sacral wound despite use of geomat; also with bowel incontinence; returned to bed  Ambulation/Gait                 Stairs             Wheelchair Mobility    Modified Rankin (Stroke Patients Only)       Balance                                            Cognition Arousal/Alertness: Awake/alert Behavior During Therapy: Flat affect Overall Cognitive Status: No family/caregiver present to determine baseline cognitive functioning Area of Impairment: Attention;Memory;Following commands;Safety/judgement;Awareness;Problem solving                   Current Attention Level: Sustained Memory: Decreased short-term memory;Decreased recall of precautions Following Commands: Follows one step commands consistently Safety/Judgement: Decreased awareness of safety;Decreased awareness of deficits Awareness: Emergent Problem Solving: Slow processing;Decreased initiation;Difficulty sequencing;Requires verbal cues;Requires tactile cues        Exercises      General Comments General comments (skin integrity, edema, etc.): Cervical pads replaced and old ones cleaned; postitioned to encourage neutral BLE rotation and bilateral heel elevation with DF      Pertinent Vitals/Pain Pain Assessment: Faces Faces Pain Scale: Hurts even more Pain Location: Sacral wound with attempts to sit in chair; neck with rolling Pain Descriptors / Indicators: Grimacing;Aching;Moaning Pain Intervention(s):  Limited activity within patient's tolerance;Monitored during session;Repositioned    Home Living                      Prior Function            PT Goals (current  goals can now be found in the care plan section) Progress towards PT goals: Progressing toward goals (slowly)    Frequency    Min 1X/week      PT Plan Frequency needs to be updated    Co-evaluation PT/OT/SLP Co-Evaluation/Treatment: Yes Reason for Co-Treatment: Complexity of the patient's impairments (multi-system involvement);For patient/therapist safety;To address functional/ADL transfers;Necessary to address cognition/behavior during functional activity PT goals addressed during session: Mobility/safety with mobility        AM-PAC PT "6 Clicks" Mobility   Outcome Measure  Help needed turning from your back to your side while in a flat bed without using bedrails?: Total Help needed moving from lying on your back to sitting on the side of a flat bed without using bedrails?: Total Help needed moving to and from a bed to a chair (including a wheelchair)?: Total Help needed standing up from a chair using your arms (e.g., wheelchair or bedside chair)?: Total Help needed to walk in hospital room?: Total Help needed climbing 3-5 steps with a railing? : Total 6 Click Score: 6    End of Session   Activity Tolerance: Patient limited by fatigue;Patient tolerated treatment well Patient left: in bed;with call bell/phone within reach Nurse Communication: Need for lift equipment;Mobility status PT Visit Diagnosis: Muscle weakness (generalized) (M62.81);Difficulty in walking, not elsewhere classified (R26.2);Other symptoms and signs involving the nervous system (R29.898);Other (comment)     Time: 5701-7793 PT Time Calculation (min) (ACUTE ONLY): 47 min  Charges:  $Therapeutic Activity: 23-37 mins                    Ina Homes, PT, DPT Acute Rehabilitation Services  Pager [phone] Office [phone]  Malachy Chamber 06/09/2020, 4:49 PM

## 2020-06-09 NOTE — Progress Notes (Deleted)
SATURATION QUALIFICATIONS: (This note is used to comply with regulatory documentation for home oxygen)  Patient Saturations on Room Air at Rest = 95%  Patient Saturations on Room Air while Ambulating = 84%  Patient Saturations on 2 Liters of oxygen while Ambulating = 93%  Please briefly explain why patient needs home oxygen: Pt needs O2 because he desats while ambulating on room air.  

## 2020-06-09 NOTE — Care Management Important Message (Signed)
Important Message  Patient Details  Name: Shannon [name] MRN: [id number] Date of Birth: 07-Sep-1945   Medicare Important Message Given:  Yes - Important Message mailed due to current National Emergency   Verbal consent obtained due to current National Emergency  Relationship to patient: Self Contact Name: Quantrell Splitt Call Date: 06/09/20  Time: 1406 Phone: [phone] Outcome: No Answer/Busy Important Message mailed to: Patient address on file    [name] 06/09/2020, 2:07 PM

## 2020-06-09 NOTE — Progress Notes (Addendum)
Physical Therapy Wound Treatment and Discharge Patient Details  Name: Shannon [name] MRN: [id number] Date of Birth: [birth year]  Today's Date: 06/09/2020 Time: 1215-1300 Time Calculation (min): 45 min  Subjective  Subjective: Agreeable to hydrotherapy this session after reoriented to what day it is now and when he had hydro last (2 days ago) Patient and Family Stated Goals: To not hurt anymore Prior Treatments: Dressing changes  Pain Score: Pt was medicated during session for pain. Reports significant pain throughout session.   Wound Assessment  Pressure Injury 05/23/20 Sacrum Unstageable - Full thickness tissue loss in which the base of the injury is covered by slough (yellow, tan, gray, green or brown) and/or eschar (tan, brown or black) in the wound bed. (Active)  Wound Image   06/09/20 1424  Dressing Type ABD;Barrier Film (skin prep);Gauze (Comment);Moist to dry 06/09/20 1424  Dressing Changed;Clean;Dry;Intact 06/09/20 1424  Dressing Change Frequency Daily 06/09/20 1424  State of Healing Non-healing 06/09/20 1424  Site / Wound Assessment Red;Yellow;Black 06/09/20 1424  % Wound base Red or Granulating 50% 06/09/20 1424  % Wound base Yellow/Fibrinous Exudate 30% 06/09/20 1424  % Wound base Black/Eschar 10% 06/09/20 1424  % Wound base Other/Granulation Tissue (Comment) 10% (connective tissue over sacrum) 06/09/20 1424  Peri-wound Assessment Intact 06/09/20 1424  Wound Length (cm) 11 cm 05/30/20 1206  Wound Width (cm) 9.5 cm 05/30/20 1206  Wound Depth (cm) 3.2 cm 05/30/20 1206  Wound Surface Area (cm^2) 104.5 cm^2 05/30/20 1206  Wound Volume (cm^3) 334.4 cm^3 05/30/20 1206  Margins Unattached edges (unapproximated) 06/09/20 1424  Drainage Amount Minimal 06/09/20 1424  Drainage Description Serosanguineous 06/09/20 1424  Treatment Debridement (Selective);Hydrotherapy (Pulse lavage);Packing (Saline gauze) 06/09/20 1424   Santyl applied to wound bed prior to applying dressing.      Hydrotherapy Pulsed lavage therapy - wound location: Sacrum Pulsed Lavage with Suction (psi): 4 psi Pulsed Lavage with Suction - Normal Saline Used: 1000 mL Pulsed Lavage Tip: Tip with splash shield Selective Debridement Selective Debridement - Location: Sacrum Selective Debridement - Tools Used: Forceps;Scalpel Selective Debridement - Tissue Removed: black eschar   Wound Assessment and Plan  Wound Therapy - Assess/Plan/Recommendations Wound Therapy - Clinical Statement: Pt agreeable to hydrotherapy however very painful. RN present to provide pain meds before end of session. Most of the remaining eschar able to be debrided at 12:00 border, leaving minimal necrotic tissue present at end of session. Feel this wound has progressed to being appropriate for wet>dry dressing changes by nursing staff. Hydrotherapy will sign off at this time. If needs change, please reconsult. Wound Therapy - Functional Problem List: Decreased mobility and global weakness Factors Delaying/Impairing Wound Healing: Immobility Hydrotherapy Plan: Debridement;Dressing change;Patient/family education;Pulsatile lavage with suction Wound Therapy - Frequency: 3X / week Wound Therapy - Follow Up Recommendations: Skilled nursing facility Wound Plan: See above  Wound Therapy Goals- Improve the function of patient's integumentary system by progressing the wound(s) through the phases of wound healing (inflammation - proliferation - remodeling) by: Decrease Necrotic Tissue to: 20% Decrease Necrotic Tissue - Progress: Partly met Increase Granulation Tissue to: 80% Increase Granulation Tissue - Progress: Partly met Goals/treatment plan/discharge plan were made with and agreed upon by patient/family: Yes Time For Goal Achievement: 7 days Wound Therapy - Potential for Goals: Fair  Goals will be updated until maximal potential achieved or discharge criteria met.  Discharge criteria: when goals achieved, discharge from  hospital, MD decision/surgical intervention, no progress towards goals, refusal/missing three consecutive treatments without notification or medical reason.  GP  Thelma Comp 06/09/2020, 2:42 PM   Rolinda Roan, PT, DPT Acute Rehabilitation Services Pager: [phone] Office: [phone]

## 2020-06-10 NOTE — Progress Notes (Signed)
Orthopedic Tech Progress Note Patient Details:  Shannon [name] Jan 17, 1946 [id number] Dropped off EXTRA PADS for [employer] Patient ID: Shannon [name], male   DOB: [birth year], 74 y.o.   MRN: [id number]   Shannon [name] 06/10/2020, 6:15 AM

## 2020-06-10 NOTE — Plan of Care (Signed)
  Problem: Education: Goal: Knowledge of General Education information will improve Description: Including pain rating scale, medication(s)/side effects and non-pharmacologic comfort measures Outcome: Progressing   Problem: Health Behavior/Discharge Planning: Goal: Ability to manage health-related needs will improve Outcome: Progressing   Problem: Clinical Measurements: Goal: Ability to maintain clinical measurements within normal limits will improve Outcome: Progressing Goal: Will remain free from infection Outcome: Progressing Goal: Diagnostic test results will improve Outcome: Progressing Goal: Respiratory complications will improve Outcome: Progressing Goal: Cardiovascular complication will be avoided Outcome: Progressing   Problem: Coping: Goal: Level of anxiety will decrease Outcome: Progressing   Problem: Elimination: Goal: Will not experience complications related to bowel motility Outcome: Progressing Goal: Will not experience complications related to urinary retention Outcome: Progressing   Problem: Pain Managment: Goal: General experience of comfort will improve Outcome: Progressing   Problem: Safety: Goal: Ability to remain free from injury will improve Outcome: Progressing   Problem: Skin Integrity: Goal: Risk for impaired skin integrity will decrease Outcome: Progressing   Problem: Education: Goal: Knowledge of risk factors and measures for prevention of condition will improve Outcome: Progressing   Problem: Coping: Goal: Psychosocial and spiritual needs will be supported Outcome: Progressing   Problem: Respiratory: Goal: Will maintain a patent airway Outcome: Progressing Goal: Complications related to the disease process, condition or treatment will be avoided or minimized Outcome: Progressing   Problem: Nutrition: Goal: Adequate nutrition will be maintained Outcome: Not Progressing   Problem: Activity: Goal: Risk for activity intolerance  will decrease Outcome: Not Applicable

## 2020-06-10 NOTE — Progress Notes (Signed)
PROGRESS NOTE                                                                                                                                                                                                             Patient Demographics:    Nona DellRonald Rought, is a 74 y.o. male, DOB - [birth year], ZOX:[id number]  Outpatient Primary MD for the patient is McGowen, Maryjean MornPhilip H, MD   Admit date - 05/21/2020   LOS - 20  Chief Complaint  Patient presents with  . Unresponsive       Brief Narrative:  74 y.o.malewith medical history significant ofquadriplegia, Chiari I malformation with syringomyelia, ACDF in October 2021, residual quadriplegia, then had a fall at home developed epidural hematoma, underwent reexploration, removal of hardware, evacuation of hematoma on 10/30, now quadriplegic with dysphagia, unstageable sacral decubitus wounds, failure to thrive, recurrent hospitalizations. Subsequently recently hospitalized (11/10-11/18) with aspiration pneumonia/UTI-discharged to SNF. Readmitted 11/28 with septic shock, severe frank hematuria and UTI treated with antibiotics, continuous bladder irrigation-stabilized-plans were for discharge to SNF on 12/14 however ended up with Covid positive test.  COVID-19 vaccinated status: Vaccinated  Significant studies: 11/28>> CT head: No acute intracranial abnormality. 11/28>> chest x-ray: Mild bilateral infrahilar atelectasis. 12/7>> chest x-ray: Right greater than left bibasilar consolidations. 12/8>> chest x-ray: Persistent bibasilar opacities right greater than left. 12/14>> chest x-ray: Unchanged bibasilar opacities.  COVID-19 medications: 12/14>> monoclonal antibody x1  Antibiotics: Cefepime: 12/8>> 12/14 Ceftriaxone: 12/1>> 12/8 Cefepime: 11/28>> 11/30 Vancomycin: 11/28 x 1 Aztreonam: 11/28 x 1  Microbiology data: 11/28>> blood culture: No growth 11/28>> urine culture: No  growth 12/7>> blood culture: No growth 12/7>> urine culture: Achromobacter Xylosoxidans  Procedures: None  Consults: Palliative care  DVT prophylaxis: enoxaparin (LOVENOX) injection 40 mg Start: 06/08/20 1230 Place and maintain sequential compression device Start: 05/23/20 1640 SCDs  Restart SQ Lovenox 12/16-watch closely for hematuria.  Note-previously on Eliquis for VTE prophylaxis-has been discontinued due to development of hematuria.    Subjective:   Patient in bed, appears comfortable, denies any headache, no fever, no chest pain or pressure, no shortness of breath , no abdominal pain. No focal weakness, amber-colored urine in Foley bag.   Assessment  & Plan :   COVID-19 infection: Asymptomatic-treated with MAB-apart from observation-does not require any further treatment at this point.  Septic shock due to UTI/aspiration  pneumonia: Sepsis physiology has resolved-has completed a course of antimicrobial therapy  Acute metabolic encephalopathy: Due to sepsis/UTI-resolved.  Aspiration pneumonia/dysphagia: Stable-no new pneumonia symptoms-SLP following-upgraded to a regular diet with thin liquids.  Remains at risk for future decompensations and aspirations-given quadriplegia/deconditioning and cervical myelopathy at baseline.  Quadriplegia with dysphagia-cervical myelopathy-s/p C-spine surgery on 10/13-subsequently sustained a fall at home and developed epidural hematoma-underwent reexploration/removal of hardware and evacuation of hematoma on 10/30.  Per prior documentation-plan is to leave cervical collar for 3 months total-till mid January, thereafter to be removed by neurosurgery.  Continues to have significant quadriplegia (able to move upper extremity side-by-side-unable to lift off the bed against gravity, able to only wiggle toes-unable to lift lower extremities off the bed against gravity)  Hematuria: Resolved.  Etiology felt to be UTI/trauma due to Foley catheter related  urethral injury.  Required CBI-that has since been discontinued.  Evaluated by urology with recommendations to continue with Foley catheter on discharge.   HTN: BP remains stable on midodrine all BP medications on hold  Multifactorial anemia-anemia of chronic disease/vitamin B12 deficiency/borderline iron deficiency/acute blood loss due to hematuria: Hemoglobin relatively stable-s/p 2 units of PRBC this admission.  Follow CBC periodically.  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  Chronic sacral decubitus ulcers [id number] sacral decubitus wound which is unstageable. General surgery was consulted., no debridement needed, was getting hydrotherapy, transitioned back to daily dressing changes  Palliative care/goals of care: Remains a full code-appreciate palliative care evaluation this admission.  Patient's overall prognosis remains poor-he is at significant risk for repeated hospitalizations due to recurrent aspirations pneumonia/UTI/sacral decubitus ulcer infection-which potentially could be life disabling or life-threatening.  Patient remains a full code-family wants to continue with full scope of treatment at this point.  Severe protein calorie malnutrition: Withseverecachexia weight loss.Supplements encouraged, now diet advanced  Obesity:  Estimated body mass index is 31.87 kg/m as calculated from the following:   Height as of this encounter: 6' (1.829 m).   Weight as of this encounter: 106.6 kg.   RN pressure injury documentation: Pressure Injury 05/03/20 Sacrum Medial Unstageable - Full thickness tissue loss in which the base of the injury is covered by slough (yellow, tan, gray, green or brown) and/or eschar (tan, brown or black) in the wound bed. (Active)  05/03/20 2023  Location: Sacrum  Location Orientation: Medial  Staging: Unstageable - Full thickness tissue loss in which the base of the  injury is covered by slough (yellow, tan, gray, green or brown) and/or eschar (tan, brown or black) in the wound bed.  Wound Description (Comments):   Present on Admission: Yes     Pressure Injury 05/22/20 Other (Comment) Right Stage 2 -  Partial thickness loss of dermis presenting as a shallow open injury with a red, pink wound bed without slough. pre-existing, dressing from nursing home on wound.  (Active)  05/22/20 0700  Location: Other (Comment) (clavicle on right)  Location Orientation: Right  Staging: Stage 2 -  Partial thickness loss of dermis presenting as a shallow open injury with a red, pink wound bed without slough.  Wound Description (Comments): pre-existing, dressing from nursing home on wound.   Present on Admission: Yes     Pressure Injury 05/23/20 Sacrum Unstageable - Full thickness tissue loss in which the base of the injury is covered by slough (yellow, tan, gray, green or brown) and/or eschar (tan, brown or black) in the wound bed. (Active)  05/23/20 1216  Location: Sacrum  Location Orientation:   Staging: Unstageable - Full thickness tissue loss in which the base of the injury is covered by slough (yellow, tan, gray, green or brown) and/or eschar (tan, brown or black) in the wound bed.  Wound Description (Comments):   Present on Admission: Yes     Pressure Injury 06/07/20 Throat Right Stage 3 -  Full thickness tissue loss. Subcutaneous fat may be visible but bone, tendon or muscle are NOT exposed. (Active)  06/07/20 0758  Location: Throat  Location Orientation: Right  Staging: Stage 3 -  Full thickness tissue loss. Subcutaneous fat may be visible but bone, tendon or muscle are NOT exposed.  Wound Description (Comments):   Present on Admission:     GI prophylaxis: PPI  Condition - Stable  Family Communication  :  Daughter [name] [phone]) left voicemail on 06/10/20 at 11 AM  Code Status :  Full Code  Diet :  Diet Order            Diet regular Room  service appropriate? Yes with Assist; Fluid consistency: Thin  Diet effective now                  Disposition Plan  :   Status is: Inpatient  Remains inpatient appropriate because:Inpatient level of care appropriate due to severity of illness   Dispo: The patient is from: SNF              Anticipated d/c is to: SNF              Anticipated d/c date is: 1 day              Patient currently is medically stable to d/c.   Barriers to discharge: Awaiting SNF bed-Covid positive status.  Antimicorbials  :    Anti-infectives (From admission, onward)   Start     Dose/Rate Route Frequency Ordered Stop   06/06/20 1000  ceFEPIme (MAXIPIME) 2 g in sodium chloride 0.9 % 100 mL IVPB        2 g 200 mL/hr over 30 Minutes Intravenous Every 8 hours 06/06/20 0242 06/07/20 2233   05/31/20 1400  ceFEPIme (MAXIPIME) 2 g in sodium chloride 0.9 % 100 mL IVPB  Status:  Discontinued        2 g 200 mL/hr over 30 Minutes Intravenous Every 8 hours 05/31/20 1240 06/06/20 0242   05/24/20 1415  cefTRIAXone (ROCEPHIN) 1 g in sodium chloride 0.9 % 100 mL IVPB        1 g 200 mL/hr over 30 Minutes Intravenous Every 24 hours 05/24/20 1330 05/31/20 1110   05/23/20 1400  ceFEPIme (MAXIPIME) 2 g in sodium chloride 0.9 % 100 mL IVPB  Status:  Discontinued        2 g 200 mL/hr over 30 Minutes Intravenous Every 8 hours 05/23/20 1011 05/24/20 1330   05/22/20 0500  ceFEPIme (MAXIPIME) 2 g in sodium chloride 0.9 % 100 mL IVPB  Status:  Discontinued        2 g 200 mL/hr over 30 Minutes Intravenous Every 12 hours 05/21/20 1614 05/23/20 1011   05/21/20 2015  aztreonam (AZACTAM) 2 g in sodium chloride 0.9 % 100 mL IVPB        2 g 200 mL/hr over 30 Minutes Intravenous  Once 05/21/20 2010 05/21/20 2230   05/21/20 1730  vancomycin (VANCOREADY) IVPB 1750 mg/350 mL        1,750 mg 175 mL/hr over  120 Minutes Intravenous  Once 05/21/20 1722 05/21/20 2148   05/21/20 1545  ceFEPIme (MAXIPIME) 2 g in sodium chloride 0.9 % 100 mL  IVPB        2 g 200 mL/hr over 30 Minutes Intravenous  Once 05/21/20 1535 05/21/20 1704      Inpatient Medications  Scheduled Meds: . (feeding supplement) PROSource Plus  30 mL Oral TID BM  . Chlorhexidine Gluconate Cloth  6 each Topical Daily  . collagenase   Topical Daily  . docusate sodium  100 mg Oral BID  . enoxaparin (LOVENOX) injection  40 mg Subcutaneous Q24H  . feeding supplement  237 mL Oral QID  . ferrous sulfate  325 mg Oral BID WC  . midodrine  10 mg Oral BID WC  . oxybutynin  5 mg Oral QHS  . pantoprazole  40 mg Oral QHS  . tamsulosin  0.4 mg Oral Daily   Continuous Infusions: . sodium chloride irrigation     PRN Meds:.acetaminophen, [DISCONTINUED] ondansetron **OR** ondansetron (ZOFRAN) IV, oxyCODONE, Resource ThickenUp Clear   Time Spent in minutes  15  See all Orders from today for further details  Susa Raring M.D on 06/10/2020 at 10:59 AM  To page go to [email or web address] - use universal password  [facility] -  Office  [phone]    Objective:   Vitals:   06/09/20 0725 06/09/20 1420 06/09/20 2122 06/10/20 0531  BP: 113/66 103/69 102/63 92/65  Pulse: 86 86 89 90  Resp: Temp: 98.7 F (37.1 C) 98.7 F (37.1 C) 98.5 F (36.9 C) 98.6 F (37 C)  TempSrc: Oral Oral Oral Oral  SpO2: 95% 95% 95% 96%  Weight:      Height:        Wt Readings from Last 3 Encounters:  06/02/20 106.6 kg  05/03/20 84.7 kg  04/22/20 91 kg     Intake/Output Summary (Last 24 hours) at 06/10/2020 1059 Last data filed at 06/10/2020 0843 Gross per 24 hour  Intake 202 ml  Output 450 ml  Net -248 ml     Physical Exam  Awake Alert, wearing c-collar, quadriplegic at baseline, [place].AT,PERRAL Supple Neck,No JVD, No cervical lymphadenopathy appriciated.  Symmetrical Chest wall movement, Good air movement bilaterally, CTAB RRR,No Gallops, Rubs or new Murmurs, No Parasternal Heave +ve B.Sounds, Abd Soft, No tenderness, No organomegaly appriciated,  No rebound - guarding or rigidity. No Cyanosis, Clubbing or edema, No new Rash or bruise    Data Review:    Recent Labs  Lab 06/04/20 0206 06/05/20 0136 06/07/20 0739  WBC 4.9 5.1 6.2  HGB 7.7* 7.7* 8.0*  HCT 23.0* 23.6* 24.3*  PLT 109* 116* 137*  MCV 90.6 90.4 92.0  MCH 30.3 29.5 30.3  MCHC 33.5 32.6 32.9  RDW 14.7 14.8 15.4    Recent Labs  Lab 06/04/20 0206 06/05/20 0136 06/07/20 0032 06/07/20 0739 06/08/20 0252  NA 135 135  --  135 137  K 3.4* 3.4*  --  3.2* 3.6  CL 102 102  --  102 103  CO2 26 26  --  28 25  GLUCOSE 97 114*  --  96 77  BUN 9 7*  --  6* 5*  CREATININE 0.54* 0.46*  --  0.53* 0.57*  CALCIUM 7.3* 7.5*  --  7.6* 7.7*  AST  --   --   --  70*  --   ALT  --   --   --  47*  --  ALKPHOS  --   --   --  80  --   BILITOT  --   --   --  0.6  --   ALBUMIN  --   --   --  1.3*  --   CRP  --   --  5.0*  --   --   DDIMER  --   --  2.96*  --   --     Recent Labs  Lab 06/04/20 0206 06/05/20 0136 06/06/20 1204 06/06/20 1546 06/07/20 0032 06/07/20 0739  WBC 4.9 5.1  --   --   --  6.2  HGB 7.7* 7.7*  --   --   --  8.0*  HCT 23.0* 23.6*  --   --   --  24.3*  PLT 109* 116*  --   --   --  137*  CRP  --   --   --   --  5.0*  --   DDIMER  --   --   --   --  2.96*  --   AST  --   --   --   --   --  70*  ALT  --   --   --   --   --  47*  ALKPHOS  --   --   --   --   --  80  BILITOT  --   --   --   --   --  0.6  ALBUMIN  --   --   --   --   --  1.3*  SARSCOV2NAA  --   --  POSITIVE* POSITIVE*  --   --       Micro Results Recent Results (from the past 240 hour(s))  SARS Coronavirus 2 by RT PCR (hospital order, performed in [facility] hospital lab) Nasopharyngeal Nasopharyngeal Swab     Status: Abnormal   Collection Time: 06/06/20 12:04 PM   Specimen: Nasopharyngeal Swab  Result Value Ref Range Status   SARS Coronavirus 2 POSITIVE (A) NEGATIVE Final    Comment: RESULT CALLED TO, READ BACK BY AND VERIFIED WITH: Vernie Ammons RN [id number] 06/06/20 A  BROWNING (NOTE) SARS-CoV-2 target nucleic acids are DETECTED  SARS-CoV-2 RNA is generally detectable in upper respiratory specimens  during the acute phase of infection.  Positive results are indicative  of the presence of the identified virus, but do not rule out bacterial infection or co-infection with other pathogens not detected by the test.  Clinical correlation with patient history and  other diagnostic information is necessary to determine patient infection status.  The expected result is negative.  Fact Sheet for Patients:   BoilerBrush.com.cy   Fact Sheet for Healthcare Providers:   https://pope.com/    This test is not yet approved or cleared by the [place] FDA and  has been authorized for detection and/or diagnosis of SARS-CoV-2 by FDA under an Emergency Use Authorization (EUA).  This EUA will remain in effect (meaning t his test can be used) for the duration of  the COVID-19 declaration under Section 564(b)(1) of the Act, 21 U.S.C. section 360-bbb-3(b)(1), unless the authorization is terminated or revoked sooner.  Performed at [facility], [address], [place], [place] [address]   SARS Coronavirus 2 by RT PCR (hospital order, performed in [facility] hospital lab) Nasopharyngeal Nasopharyngeal Swab     Status: Abnormal   Collection Time: 06/06/20  3:46 PM   Specimen: Nasopharyngeal Swab  Result Value Ref Range Status   SARS Coronavirus 2 POSITIVE (A) NEGATIVE Final    Comment: RESULT CALLED TO, READ BACK BY AND VERIFIED WITH: Vernie Ammons RN [id number] 06/06/20 A BROWNING (NOTE) SARS-CoV-2 target nucleic acids are DETECTED  SARS-CoV-2 RNA is generally detectable in upper respiratory specimens  during the acute phase of infection.  Positive results are indicative  of the presence of the identified virus, but do not rule out bacterial infection or co-infection with other pathogens not detected by the test.   Clinical correlation with patient history and  other diagnostic information is necessary to determine patient infection status.  The expected result is negative.  Fact Sheet for Patients:   BoilerBrush.com.cy   Fact Sheet for Healthcare Providers:   https://pope.com/    This test is not yet approved or cleared by the [place] FDA and  has been authorized for detection and/or diagnosis of SARS-CoV-2 by FDA under an Emergency Use Authorization (EUA).  This EUA will remain in effect (meaning t his test can be used) for the duration of  the COVID-19 declaration under Section 564(b)(1) of the Act, 21 U.S.C. section 360-bbb-3(b)(1), unless the authorization is terminated or revoked sooner.  Performed at [facility], [address], [place], [place] [address]     Radiology Reports DG Chest 2 View  Result Date: 05/31/2020 CLINICAL DATA:  Fever, hypertension, former smoker EXAM: CHEST - 2 VIEW COMPARISON:  05/30/2020 FINDINGS: Normal heart size, mediastinal contours, and pulmonary vascularity. RIGHT greater than LEFT basilar opacities question pneumonia versus atelectasis little changed. Small bibasilar effusions larger on RIGHT. No pneumothorax. Degenerative changes of LEFT glenohumeral joint. IMPRESSION: Persistent bibasilar opacities question atelectasis versus pneumonia with small bibasilar effusions, both greater on RIGHT. Electronically Signed   By: Ulyses Southward M.D.   On: 05/31/2020 08:56   CT Head Wo Contrast  Result Date: 05/21/2020 CLINICAL DATA:  Mental status change. EXAM: CT HEAD WITHOUT CONTRAST TECHNIQUE: Contiguous axial images were obtained from the base of the skull through the vertex without intravenous contrast. COMPARISON:  May 03, 2020 FINDINGS: Brain: No evidence of acute infarction, hemorrhage, hydrocephalus, extra-axial collection or mass lesion/mass effect. Mild brain parenchymal volume loss and deep white  matter microangiopathy. Vascular: No hyperdense vessel or unexpected calcification. Skull: Normal. Negative for fracture or focal lesion. Sinuses/Orbits: No acute finding. Other: None. IMPRESSION: 1. No acute intracranial abnormality. 2. 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 Electronically Signed   By: Ted Mcalpine M.D.   On: 05/21/2020 17:53   DG CHEST PORT 1 VIEW  Result Date: 06/06/2020 CLINICAL DATA:  Hypoxia EXAM: PORTABLE CHEST 1 VIEW COMPARISON:  05/31/2020 and prior FINDINGS: No pneumothorax. Patchy bibasilar opacities and small effusions, unchanged. Stable cardiomediastinal silhouette and osseous structures. IMPRESSION: Bibasilar opacities and small effusions, unchanged. Electronically Signed   By: Stana Bunting M.D.   On: 06/06/2020 19:17   DG CHEST PORT 1 VIEW  Result Date: 05/30/2020 CLINICAL DATA:  Fever chest pain EXAM: PORTABLE CHEST 1 VIEW COMPARISON:  05/21/2020, 05/08/2020, 05/06/2020 FINDINGS: Low lung volumes with elevation of right diaphragm. Similar right greater than left basilar consolidations. Possible small pleural effusions. Stable cardiomediastinal silhouette. No pneumothorax IMPRESSION: Similar appearance of right greater than left basilar consolidations, atelectasis versus pneumonia, with possible small pleural effusions. Electronically Signed   By: Jasmine Pang M.D.   On: 05/30/2020 19:16   DG Chest Port 1 View  Result Date: 05/21/2020 CLINICAL DATA:  Found unresponsive. EXAM: PORTABLE CHEST 1  VIEW COMPARISON:  May 08, 2020 FINDINGS: There is stable elevation of the right hemidiaphragm. Mild bilateral infrahilar atelectasis is seen. There is no evidence of a pleural effusion or pneumothorax. The heart size and mediastinal contours are within normal limits. The visualized skeletal structures are unremarkable. IMPRESSION: Mild bilateral infrahilar atelectasis. Electronically Signed   By: Aram Candela M.D.   On: 05/21/2020  16:34   DG Swallowing Func-Speech Pathology  Result Date: 06/05/2020 Objective Swallowing Evaluation: Type of Study: MBS-Modified Barium Swallow Study  Patient Details Name: Shayden Bobier MRN: [id number] Date of Birth: May 16, 1946 Today's Date: 06/05/2020 Time: SLP Start Time (ACUTE ONLY): 1300 -SLP Stop Time (ACUTE ONLY): 1316 SLP Time Calculation (min) (ACUTE ONLY): 16 min Past Medical History: Past Medical History: Diagnosis Date . Asthma  . 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 Newell Coral, 1992-->surg at [facility].  Summer 2021->Cervicalgia,arm pain, hand atrophy RUE wkness, hyperreflex (Dr. 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 Hay fever  . Hypertension  . Osteoarthritis of both hands  Past Surgical History: Past Surgical History: Procedure Laterality Date . ANTERIOR CERVICAL DECOMP/DISCECTOMY FUSION N/A 04/05/2020  Procedure: ANTERIOR CERVICAL DECOMPRESSION/DISCECTOMY FUSION, INTERBODY PROSTHESIS, PLATE/SCREWS CERVICAL THREE-CERVICAL FOUR, CERVICAL FOUR- CERVICAL FIVE;  Surgeon: Tressie Stalker, MD;  Location: [facility] OR;  Service: Neurosurgery;  Laterality: N/A; . ANTERIOR CERVICAL DECOMP/DISCECTOMY FUSION N/A 04/22/2020  Procedure: Reexploration of anterior cervical wound for epidural hematoma;  Surgeon: Donalee Citrin, MD;  Location: [facility] OR;  Service: Neurosurgery;  Laterality: N/A; . BACK SURGERY   . INCISION AND DRAINAGE Left 2012  L hand infection . ROTATOR CUFF REPAIR Right   x 3 . SPINE SURGERY   HPI: Doroteo Nickolson Smithis a 74 y.o.malewith medical history significant ofquadriplegia which follows surgery for Chiari I malformation with syringomyelia surgery in October 2021.  He originally had a C3-4 [place] 4-5 anterior cervical disc ACDF with decompression, C3 3-4 and C4-5 interbody arthrodesis with local autograft bone, anterior cervical plating of C3-C5 on 04/05/2020.   He then went home and had a fall from his bed, he was then hospitalized and from here he was sent to a nursing home, he has been quadriplegic since then, he has also developed sacral decubitus ulcers. Was seen for MBS on 05/04/20 "deficits resulted in silent aspiration of thin liquid and nectar thick liquid by straw prior to the swallow. With nectar thick liquid by cup, only transient penetration was seen. There was significant pharyngeal residue with puree and solid consistencies which was reduced but not fully cleared with liquid wash." Now admitted for AMS due to UTI, sepsis and hypotension in a patient with indwelling Foley catheter POA and sacral decubitus ulcers POA who is quadriplegic. Pt/niece preferred to remain on nectar thick full liquids- pt stable and ST signed off. Re-ordered by hospitalist "suspicion for aspiration pna".   Subjective: alert, cooperative, pleasant, participative. Assessment / Plan / Recommendation CHL IP CLINICAL IMPRESSIONS 06/05/2020 Clinical Impression Pt demonstrates normal swallow ability. No penetration, aspiration or residue was observed. Pt at times had mild spillage of oral residue to pharynx which he sensed and swallowed, not outside of normal limits. He is able to masticate and esophageal sweep with solids appeared WNL. Will initiate a regular diet and thin liquids to allow foods of choice though pt will need total assist feeding. Will f/u for tolerance. SLP Visit Diagnosis Dysphagia, unspecified (R13.10) Attention and concentration deficit  following -- Frontal lobe and executive function deficit following -- Impact on safety and function Mild aspiration risk;Risk for inadequate nutrition/hydration   CHL IP TREATMENT RECOMMENDATION 06/05/2020 Treatment Recommendations Therapy as outlined in treatment plan below   Prognosis 06/05/2020 Prognosis for Safe Diet Advancement Good Barriers to Reach Goals -- Barriers/Prognosis Comment -- CHL IP DIET RECOMMENDATION 06/05/2020 SLP  Diet Recommendations Regular solids;Thin liquid Liquid Administration via Cup;Straw Medication Administration Whole meds with liquid Compensations Slow rate;Small sips/bites;Follow solids with liquid Postural Changes --   CHL IP OTHER RECOMMENDATIONS 06/05/2020 Recommended Consults -- Oral Care Recommendations Oral care BID Other Recommendations --   CHL IP FOLLOW UP RECOMMENDATIONS 06/05/2020 Follow up Recommendations Skilled Nursing facility   [facility] IP FREQUENCY AND DURATION 06/05/2020 Speech Therapy Frequency (ACUTE ONLY) min 2x/week Treatment Duration 2 weeks      CHL IP ORAL PHASE 06/05/2020 Oral Phase WFL Oral - Pudding Teaspoon -- Oral - Pudding Cup -- Oral - Honey Teaspoon -- Oral - Honey Cup -- Oral - Nectar Teaspoon -- Oral - Nectar Cup -- Oral - Nectar Straw -- Oral - Thin Teaspoon -- Oral - Thin Cup -- Oral - Thin Straw -- Oral - Puree -- Oral - Mech Soft -- Oral - Regular -- Oral - Multi-Consistency -- Oral - Pill -- Oral Phase - Comment --  [facility] IP PHARYNGEAL PHASE 06/05/2020 Pharyngeal Phase WFL Pharyngeal- Pudding Teaspoon -- Pharyngeal -- Pharyngeal- Pudding Cup -- Pharyngeal -- Pharyngeal- Honey Teaspoon -- Pharyngeal -- Pharyngeal- Honey Cup -- Pharyngeal -- Pharyngeal- Nectar Teaspoon -- Pharyngeal -- Pharyngeal- Nectar Cup -- Pharyngeal -- Pharyngeal- Nectar Straw -- Pharyngeal -- Pharyngeal- Thin Teaspoon -- Pharyngeal -- Pharyngeal- Thin Cup -- Pharyngeal -- Pharyngeal- Thin Straw -- Pharyngeal -- Pharyngeal- Puree -- Pharyngeal -- Pharyngeal- Mechanical Soft -- Pharyngeal -- Pharyngeal- Regular -- Pharyngeal -- Pharyngeal- Multi-consistency -- Pharyngeal -- Pharyngeal- Pill -- Pharyngeal -- Pharyngeal Comment --  [facility] IP CERVICAL ESOPHAGEAL PHASE 06/05/2020 Cervical Esophageal Phase -- Pudding Teaspoon -- Pudding Cup -- Honey Teaspoon -- Honey Cup -- Nectar Teaspoon -- Nectar Cup -- Nectar Straw -- Thin Teaspoon -- Thin Cup WFL Thin Straw WFL Puree WFL Mechanical Soft WFL Regular --  Multi-consistency -- Pill WFL Cervical Esophageal Comment -- Harlon Ditty, MA CCC-SLP [facility] Pager [phone] Office [phone] Claudine Mouton 06/05/2020, 1:53 PM

## 2020-06-11 LAB — CBC
HCT: 28.3 % — ABNORMAL LOW (ref 39.0–52.0)
Hemoglobin: 8.7 g/dL — ABNORMAL LOW (ref 13.0–17.0)
MCH: 28.9 pg (ref 26.0–34.0)
MCHC: 30.7 g/dL (ref 30.0–36.0)
MCV: 94 fL (ref 80.0–100.0)
Platelets: 222 10*3/uL (ref 150–400)
RBC: 3.01 MIL/uL — ABNORMAL LOW (ref 4.22–5.81)
RDW: 15.8 % — ABNORMAL HIGH (ref 11.5–15.5)
WBC: 6.5 10*3/uL (ref 4.0–10.5)
nRBC: 0 % (ref 0.0–0.2)

## 2020-06-11 LAB — BASIC METABOLIC PANEL
Anion gap: 5 (ref 5–15)
BUN: 10 mg/dL (ref 8–23)
CO2: 30 mmol/L (ref 22–32)
Calcium: 7.8 mg/dL — ABNORMAL LOW (ref 8.9–10.3)
Chloride: 102 mmol/L (ref 98–111)
Creatinine, Ser: 0.51 mg/dL — ABNORMAL LOW (ref 0.61–1.24)
GFR, Estimated: >60 mL/min (ref >60)
Glucose, Bld: 113 mg/dL — ABNORMAL HIGH (ref 70–99)
Potassium: 3.8 mmol/L (ref 3.5–5.1)
Sodium: 137 mmol/L (ref 135–145)

## 2020-06-11 LAB — MAGNESIUM: Magnesium: 1.7 mg/dL (ref 1.7–2.4)

## 2020-06-11 MED ORDER — OXYCODONE HCL 5 MG PO TABS
5.0000 mg | ORAL_TABLET | Freq: Three times a day (TID) | ORAL | Status: DC | PRN
  Administered 2020-06-11 – 2020-06-12 (×2): 5 mg via ORAL
  Filled 2020-06-11 (×2): qty 1

## 2020-06-11 NOTE — Progress Notes (Signed)
PROGRESS NOTE                                                                                                                                                                                                             Patient Demographics:    Shannon [name], is a 74 y.o. male, DOB - Aug 10, 1945, EXB:[id number]  Outpatient Primary MD for the patient is McGowen, Maryjean MornPhilip H, MD   Admit date - 05/21/2020   LOS - 21  Chief Complaint  Patient presents with  . Unresponsive       Brief Narrative:  74 y.o.malewith medical history significant ofquadriplegia, Chiari I malformation with syringomyelia, ACDF in October 2021, residual quadriplegia, then had a fall at home developed epidural hematoma, underwent reexploration, removal of hardware, evacuation of hematoma on 10/30, now quadriplegic with dysphagia, unstageable sacral decubitus wounds, failure to thrive, recurrent hospitalizations. Subsequently recently hospitalized (11/10-11/18) with aspiration pneumonia/UTI-discharged to SNF. Readmitted 11/28 with septic shock, severe frank hematuria and UTI treated with antibiotics, continuous bladder irrigation-stabilized-plans were for discharge to SNF on 12/14 however ended up with Covid positive test.  COVID-19 vaccinated status: Vaccinated  Significant studies: 11/28>> CT head: No acute intracranial abnormality. 11/28>> chest x-ray: Mild bilateral infrahilar atelectasis. 12/7>> chest x-ray: Right greater than left bibasilar consolidations. 12/8>> chest x-ray: Persistent bibasilar opacities right greater than left. 12/14>> chest x-ray: Unchanged bibasilar opacities.  COVID-19 medications: 12/14>> monoclonal antibody x1  Antibiotics: Cefepime: 12/8>> 12/14 Ceftriaxone: 12/1>> 12/8 Cefepime: 11/28>> 11/30 Vancomycin: 11/28 x 1 Aztreonam: 11/28 x 1  Microbiology data: 11/28>> blood culture: No growth 11/28>> urine culture: No  growth 12/7>> blood culture: No growth 12/7>> urine culture: Achromobacter Xylosoxidans  Procedures: None  Consults: Palliative care  DVT prophylaxis: enoxaparin (LOVENOX) injection 40 mg Start: 06/08/20 1230 Place and maintain sequential compression device Start: 05/23/20 1640 SCDs  Restart SQ Lovenox 12/16-watch closely for hematuria.  Note-previously on Eliquis for VTE prophylaxis-has been discontinued due to development of hematuria.    Subjective:   Patient in bed, appears comfortable, denies any headache, no fever, no chest pain or pressure, no shortness of breath , no abdominal pain. No focal weakness.   Assessment  & Plan :   COVID-19 infection: Asymptomatic-treated with MAB-apart from observation-does not require any further treatment at this point.  Septic shock due to UTI/aspiration pneumonia: Sepsis physiology has resolved-has  completed a course of antimicrobial therapy  Acute metabolic encephalopathy: Due to sepsis/UTI-resolved.  Aspiration pneumonia/dysphagia: Stable-no new pneumonia symptoms-SLP following-upgraded to a regular diet with thin liquids.  Remains at risk for future decompensations and aspirations-given quadriplegia/deconditioning and cervical myelopathy at baseline.  Quadriplegia with dysphagia-cervical myelopathy-s/p C-spine surgery on 10/13-subsequently sustained a fall at home and developed epidural hematoma-underwent reexploration/removal of hardware and evacuation of hematoma on 10/30.  Per prior documentation-plan is to leave cervical collar for 3 months total-till mid January, thereafter to be removed by neurosurgeon Dr. Lovell Sheehan.  Continues to have significant quadriplegia (able to move upper extremity side-by-side-unable to lift off the bed against gravity, able to only wiggle toes-unable to lift lower extremities off the bed against gravity)  Hematuria: Resolved.  Etiology felt to be UTI/trauma due to Foley catheter related urethral injury.   Required CBI-that has since been discontinued.  Evaluated by urology with recommendations to continue with Foley catheter on discharge.   HTN: BP remains stable on midodrine all BP medications on hold  Multifactorial anemia-anemia of chronic disease/vitamin B12 deficiency/borderline iron deficiency/acute blood loss due to hematuria: Hemoglobin relatively stable-s/p 2 units of PRBC this admission.  Follow CBC periodically.  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  Chronic sacral decubitus ulcers QPY:PPJKD sacral decubitus wound which is unstageable. General surgery was consulted., no debridement needed, was getting hydrotherapy, transitioned back to daily dressing changes  Palliative care/goals of care: Remains a full code-appreciate palliative care evaluation this admission.  Patient's overall prognosis remains poor-he is at significant risk for repeated hospitalizations due to recurrent aspirations pneumonia/UTI/sacral decubitus ulcer infection-which potentially could be life disabling or life-threatening.  Patient remains a full code-family wants to continue with full scope of treatment at this point.  Severe protein calorie malnutrition: Withseverecachexia weight loss.Supplements encouraged, now diet advanced  Obesity:  Estimated body mass index is 31.87 kg/m as calculated from the following:   Height as of this encounter: 6' (1.829 m).   Weight as of this encounter: 106.6 kg.   RN pressure injury documentation: Pressure Injury 05/03/20 Sacrum Medial Unstageable - Full thickness tissue loss in which the base of the injury is covered by slough (yellow, tan, gray, green or brown) and/or eschar (tan, brown or black) in the wound bed. (Active)  05/03/20 2023  Location: Sacrum  Location Orientation: Medial  Staging: Unstageable - Full thickness tissue loss in which the base of the injury is covered  by slough (yellow, tan, gray, green or brown) and/or eschar (tan, brown or black) in the wound bed.  Wound Description (Comments):   Present on Admission: Yes     Pressure Injury 05/22/20 Other (Comment) Right Stage 2 -  Partial thickness loss of dermis presenting as a shallow open injury with a red, pink wound bed without slough. pre-existing, dressing from nursing home on wound.  (Active)  05/22/20 0700  Location: Other (Comment) (clavicle on right)  Location Orientation: Right  Staging: Stage 2 -  Partial thickness loss of dermis presenting as a shallow open injury with a red, pink wound bed without slough.  Wound Description (Comments): pre-existing, dressing from nursing home on wound.   Present on Admission: Yes     Pressure Injury 05/23/20 Sacrum Unstageable - Full thickness tissue loss in which the base of the injury is covered by slough (yellow, tan, gray, green or brown) and/or eschar (tan, brown or black) in the wound bed. (Active)  05/23/20 1216  Location: Sacrum  Location Orientation:   Staging: Unstageable - Full thickness tissue loss in which the base of the injury is covered by slough (yellow, tan, gray, green or brown) and/or eschar (tan, brown or black) in the wound bed.  Wound Description (Comments):   Present on Admission: Yes     Pressure Injury 06/07/20 Throat Right Stage 3 -  Full thickness tissue loss. Subcutaneous fat may be visible but bone, tendon or muscle are NOT exposed. (Active)  06/07/20 0758  Location: Throat  Location Orientation: Right  Staging: Stage 3 -  Full thickness tissue loss. Subcutaneous fat may be visible but bone, tendon or muscle are NOT exposed.  Wound Description (Comments):   Present on Admission:     GI prophylaxis: PPI  Condition - Stable  Family Communication  :  Daughter [name] [phone]) left voicemail on 06/10/20 at 11 AM  Code Status :  Full Code  Diet :  Diet Order            Diet regular Room service appropriate?  Yes with Assist; Fluid consistency: Thin  Diet effective now                  Disposition Plan  :   Status is: Inpatient  Remains inpatient appropriate because:Inpatient level of care appropriate due to severity of illness   Dispo: The patient is from: SNF              Anticipated d/c is to: SNF              Anticipated d/c date is: 1 day              Patient currently is medically stable to d/c.   Barriers to discharge: Awaiting SNF bed-Covid positive status.  Antimicorbials  :    Anti-infectives (From admission, onward)   Start     Dose/Rate Route Frequency Ordered Stop   06/06/20 1000  ceFEPIme (MAXIPIME) 2 g in sodium chloride 0.9 % 100 mL IVPB        2 g 200 mL/hr over 30 Minutes Intravenous Every 8 hours 06/06/20 0242 06/07/20 2233   05/31/20 1400  ceFEPIme (MAXIPIME) 2 g in sodium chloride 0.9 % 100 mL IVPB  Status:  Discontinued        2 g 200 mL/hr over 30 Minutes Intravenous Every 8 hours 05/31/20 1240 06/06/20 0242   05/24/20 1415  cefTRIAXone (ROCEPHIN) 1 g in sodium chloride 0.9 % 100 mL IVPB        1 g 200 mL/hr over 30 Minutes Intravenous Every 24 hours 05/24/20 1330 05/31/20 1110   05/23/20 1400  ceFEPIme (MAXIPIME) 2 g in sodium chloride 0.9 % 100 mL IVPB  Status:  Discontinued        2 g 200 mL/hr over 30 Minutes Intravenous Every 8 hours 05/23/20 1011 05/24/20 1330   05/22/20 0500  ceFEPIme (MAXIPIME) 2 g in sodium chloride 0.9 % 100 mL IVPB  Status:  Discontinued        2 g 200 mL/hr over 30 Minutes Intravenous Every 12 hours 05/21/20 1614 05/23/20 1011   05/21/20 2015  aztreonam (AZACTAM) 2 g in sodium chloride 0.9 % 100 mL IVPB        2 g 200 mL/hr over 30 Minutes Intravenous  Once 05/21/20 2010 05/21/20 2230   05/21/20 1730  vancomycin (VANCOREADY) IVPB 1750 mg/350 mL        1,750 mg 175 mL/hr over 120 Minutes Intravenous  Once 05/21/20 1722 05/21/20 2148   05/21/20 1545  ceFEPIme (MAXIPIME) 2 g in sodium chloride 0.9 % 100 mL IVPB        2 g 200  mL/hr over 30 Minutes Intravenous  Once 05/21/20 1535 05/21/20 1704      Inpatient Medications  Scheduled Meds: . (feeding supplement) PROSource Plus  30 mL Oral TID BM  . Chlorhexidine Gluconate Cloth  6 each Topical Daily  . collagenase   Topical Daily  . docusate sodium  100 mg Oral BID  . enoxaparin (LOVENOX) injection  40 mg Subcutaneous Q24H  . feeding supplement  237 mL Oral QID  . ferrous sulfate  325 mg Oral BID WC  . midodrine  10 mg Oral BID WC  . oxybutynin  5 mg Oral QHS  . pantoprazole  40 mg Oral QHS  . tamsulosin  0.4 mg Oral Daily   Continuous Infusions: . sodium chloride irrigation     PRN Meds:.acetaminophen, [DISCONTINUED] ondansetron **OR** ondansetron (ZOFRAN) IV, oxyCODONE, Resource ThickenUp Clear   Time Spent in minutes  15  See all Orders from today for further details  Susa Raring M.D on 06/11/2020 at 11:29 AM  To page go to [email or web address] - use universal password  [facility] -  Office  [phone]    Objective:   Vitals:   06/10/20 1224 06/10/20 2030 06/11/20 0629 06/11/20 0727  BP: 114/67 115/70 102/64 100/67  Pulse: 92 99 84 77  Resp: Temp: 98.3 F (36.8 C) 98.1 F (36.7 C) 98.3 F (36.8 C) 98.3 F (36.8 C)  TempSrc: Oral Axillary Axillary Axillary  SpO2: 93% 94% 100% 97%  Weight:      Height:        Wt Readings from Last 3 Encounters:  06/02/20 106.6 kg  05/03/20 84.7 kg  04/22/20 91 kg     Intake/Output Summary (Last 24 hours) at 06/11/2020 1129 Last data filed at 06/11/2020 0600 Gross per 24 hour  Intake 1160 ml  Output 900 ml  Net 260 ml     Physical Exam  Awake Alert, wearing c-collar, quadriplegic at baseline, [place].AT,PERRAL Supple Neck,No JVD, No cervical lymphadenopathy appriciated.  Symmetrical Chest wall movement, Good air movement bilaterally, CTAB RRR,No Gallops, Rubs or new Murmurs, No Parasternal Heave +ve B.Sounds, Abd Soft, No tenderness, No organomegaly appriciated, No  rebound - guarding or rigidity. No Cyanosis, Clubbing or edema, No new Rash or bruise     Data Review:    Recent Labs  Lab 06/05/20 0136 06/07/20 0739 06/11/20 0421  WBC 5.1 6.2 6.5  HGB 7.7* 8.0* 8.7*  HCT 23.6* 24.3* 28.3*  PLT 116* 137* 222  MCV 90.4 92.0 94.0  MCH 29.5 30.3 28.9  MCHC 32.6 32.9 30.7  RDW 14.8 15.4 15.8*    Recent Labs  Lab 06/05/20 0136 06/07/20 0032 06/07/20 0739 06/08/20 0252 06/11/20 0421  NA 135  --  135 137 137  K 3.4*  --  3.2* 3.6 3.8  CL 102  --  102 103 102  CO2 26  --  GLUCOSE 114*  --  96 77 113*  BUN 7*  --  6* 5* 10  CREATININE 0.46*  --  0.53* 0.57* 0.51*  CALCIUM 7.5*  --  7.6* 7.7* 7.8*  AST  --   --  70*  --   --   ALT  --   --  47*  --   --   ALKPHOS  --   --  80  --   --   BILITOT  --   --  0.6  --   --   ALBUMIN  --   --  1.3*  --   --   MG  --   --   --   --  1.7  CRP  --  5.0*  --   --   --   DDIMER  --  2.96*  --   --   --     Recent Labs  Lab 06/05/20 0136 06/06/20 1204 06/06/20 1546 06/07/20 0032 06/07/20 0739 06/11/20 0421  WBC 5.1  --   --   --  6.2 6.5  HGB 7.7*  --   --   --  8.0* 8.7*  HCT 23.6*  --   --   --  24.3* 28.3*  PLT 116*  --   --   --  137* 222  CRP  --   --   --  5.0*  --   --   DDIMER  --   --   --  2.96*  --   --   AST  --   --   --   --  70*  --   ALT  --   --   --   --  47*  --   ALKPHOS  --   --   --   --  80  --   BILITOT  --   --   --   --  0.6  --   ALBUMIN  --   --   --   --  1.3*  --   SARSCOV2NAA  --  POSITIVE* POSITIVE*  --   --   --       Micro Results Recent Results (from the past 240 hour(s))  SARS Coronavirus 2 by RT PCR (hospital order, performed in [facility] hospital lab) Nasopharyngeal Nasopharyngeal Swab     Status: Abnormal   Collection Time: 06/06/20 12:04 PM   Specimen: Nasopharyngeal Swab  Result Value Ref Range Status   SARS Coronavirus 2 POSITIVE (A) NEGATIVE Final    Comment: RESULT CALLED TO, READ BACK BY AND VERIFIED WITH: Vernie Ammons  RN [id number] 06/06/20 A BROWNING (NOTE) SARS-CoV-2 target nucleic acids are DETECTED  SARS-CoV-2 RNA is generally detectable in upper respiratory specimens  during the acute phase of infection.  Positive results are indicative  of the presence of the identified virus, but do not rule out bacterial infection or co-infection with other pathogens not detected by the test.  Clinical correlation with patient history and  other diagnostic information is necessary to determine patient infection status.  The expected result is negative.  Fact Sheet for Patients:   BoilerBrush.com.cy   Fact Sheet for Healthcare Providers:   https://pope.com/    This test is not yet approved or cleared by the [place] FDA and  has been authorized for detection and/or diagnosis of SARS-CoV-2 by FDA under an Emergency Use Authorization (EUA).  This EUA will remain in effect (meaning t his test can be used) for the duration of  the COVID-19 declaration under Section 564(b)(1) of the Act, 21 U.S.C. section 360-bbb-3(b)(1), unless the authorization is terminated or revoked sooner.  Performed at [facility], [address] [place], [place] [address]   SARS Coronavirus 2 by RT PCR (hospital order, performed in [facility] hospital lab) Nasopharyngeal Nasopharyngeal Swab     Status: Abnormal   Collection Time: 06/06/20  3:46 PM   Specimen: Nasopharyngeal Swab  Result Value Ref Range Status   SARS Coronavirus 2 POSITIVE (A) NEGATIVE Final    Comment: RESULT CALLED TO, READ BACK BY AND VERIFIED WITH: Vernie Ammons RN 1610 06/06/20 A BROWNING (NOTE) SARS-CoV-2 target nucleic acids are DETECTED  SARS-CoV-2 RNA is generally detectable in upper respiratory specimens  during the acute phase of infection.  Positive results are indicative  of the presence of the identified virus, but do not rule out bacterial infection or co-infection with other pathogens  not detected by the test.  Clinical correlation with patient history and  other diagnostic information is necessary to determine patient infection status.  The expected result is negative.  Fact Sheet for Patients:   BoilerBrush.com.cy   Fact Sheet for Healthcare Providers:   https://pope.com/    This test is not yet approved or cleared by the [place] FDA and  has been authorized for detection and/or diagnosis of SARS-CoV-2 by FDA under an Emergency Use Authorization (EUA).  This EUA will remain in effect (meaning t his test can be used) for the duration of  the COVID-19 declaration under Section 564(b)(1) of the Act, 21 U.S.C. section 360-bbb-3(b)(1), unless the authorization is terminated or revoked sooner.  Performed at [facility], [address], [place], [place] [address]     Radiology Reports DG Chest 2 View  Result Date: 05/31/2020 CLINICAL DATA:  Fever, hypertension, former smoker EXAM: CHEST - 2 VIEW COMPARISON:  05/30/2020 FINDINGS: Normal heart size, mediastinal contours, and pulmonary vascularity. RIGHT greater than LEFT basilar opacities question pneumonia versus atelectasis little changed. Small bibasilar effusions larger on RIGHT. No pneumothorax. Degenerative changes of LEFT glenohumeral joint. IMPRESSION: Persistent bibasilar opacities question atelectasis versus pneumonia with small bibasilar effusions, both greater on RIGHT. Electronically Signed   By: Ulyses Southward M.D.   On: 05/31/2020 08:56   CT Head Wo Contrast  Result Date: 05/21/2020 CLINICAL DATA:  Mental status change. EXAM: CT HEAD WITHOUT CONTRAST TECHNIQUE: Contiguous axial images were obtained from the base of the skull through the vertex without intravenous contrast. COMPARISON:  May 03, 2020 FINDINGS: Brain: No evidence of acute infarction, hemorrhage, hydrocephalus, extra-axial collection or mass lesion/mass effect. Mild brain parenchymal  volume loss and deep white matter microangiopathy. Vascular: No hyperdense vessel or unexpected calcification. Skull: Normal. Negative for fracture or focal lesion. Sinuses/Orbits: No acute finding. Other: None. IMPRESSION: 1. No acute intracranial abnormality. 2. 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 Electronically Signed   By: Ted Mcalpine M.D.   On: 05/21/2020 17:53   DG CHEST PORT 1 VIEW  Result Date: 06/06/2020 CLINICAL DATA:  Hypoxia EXAM: PORTABLE CHEST 1 VIEW COMPARISON:  05/31/2020 and prior FINDINGS: No pneumothorax. Patchy bibasilar opacities and small effusions, unchanged. Stable cardiomediastinal silhouette and osseous structures. IMPRESSION: Bibasilar opacities and small effusions, unchanged. Electronically Signed   By: Stana Bunting M.D.   On: 06/06/2020 19:17   DG CHEST PORT 1 VIEW  Result Date: 05/30/2020 CLINICAL DATA:  Fever chest pain EXAM: PORTABLE CHEST 1 VIEW COMPARISON:  05/21/2020, 05/08/2020, 05/06/2020 FINDINGS: Low lung volumes with elevation of right diaphragm. Similar right greater than left basilar consolidations. Possible small pleural effusions. Stable cardiomediastinal silhouette. No pneumothorax IMPRESSION: Similar appearance of right greater than left basilar consolidations, atelectasis versus pneumonia, with possible small pleural effusions. Electronically Signed   By: Jasmine Pang M.D.   On: 05/30/2020 19:16   DG Chest Port 1 View  Result Date: 05/21/2020 CLINICAL  DATA:  Found unresponsive. EXAM: PORTABLE CHEST 1 VIEW COMPARISON:  May 08, 2020 FINDINGS: There is stable elevation of the right hemidiaphragm. Mild bilateral infrahilar atelectasis is seen. There is no evidence of a pleural effusion or pneumothorax. The heart size and mediastinal contours are within normal limits. The visualized skeletal structures are unremarkable. IMPRESSION: Mild bilateral infrahilar atelectasis. Electronically Signed   By: Aram Candela M.D.   On: 05/21/2020 16:34   DG Swallowing Func-Speech Pathology  Result Date: 06/05/2020 Objective Swallowing Evaluation: Type of Study: MBS-Modified Barium Swallow Study  Patient Details Name: Green Quincy MRN: [id number] Date of Birth: Oct 23, 1945 Today's Date: 06/05/2020 Time: SLP Start Time (ACUTE ONLY): 1300 -SLP Stop Time (ACUTE ONLY): 1316 SLP Time Calculation (min) (ACUTE ONLY): 16 min Past Medical History: Past Medical History: Diagnosis Date . Asthma  . 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 Newell Coral, 1992-->surg at [facility].  Summer 2021->Cervicalgia,arm pain, hand atrophy RUE wkness, hyperreflex (Dr. 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 Hay fever  . Hypertension  . Osteoarthritis of both hands  Past Surgical History: Past Surgical History: Procedure Laterality Date . ANTERIOR CERVICAL DECOMP/DISCECTOMY FUSION N/A 04/05/2020  Procedure: ANTERIOR CERVICAL DECOMPRESSION/DISCECTOMY FUSION, INTERBODY PROSTHESIS, PLATE/SCREWS CERVICAL THREE-CERVICAL FOUR, CERVICAL FOUR- CERVICAL FIVE;  Surgeon: Tressie Stalker, MD;  Location: [facility] OR;  Service: Neurosurgery;  Laterality: N/A; . ANTERIOR CERVICAL DECOMP/DISCECTOMY FUSION N/A 04/22/2020  Procedure: Reexploration of anterior cervical wound for epidural hematoma;  Surgeon: Donalee Citrin, MD;  Location: [facility] OR;  Service: Neurosurgery;  Laterality: N/A; . BACK SURGERY   . INCISION AND DRAINAGE Left 2012  L hand infection . ROTATOR CUFF REPAIR Right   x 3 . SPINE SURGERY   HPI: Catalino Plascencia Smithis a 74 y.o.malewith medical history significant ofquadriplegia which follows surgery for Chiari I malformation with syringomyelia surgery in October 2021.  He originally had a C3-4 [place] 4-5 anterior cervical disc ACDF with decompression, C3 3-4 and C4-5 interbody arthrodesis with local autograft bone, anterior cervical  plating of C3-C5 on 04/05/2020.  He then went home and had a fall from his bed, he was then hospitalized and from here he was sent to a nursing home, he has been quadriplegic since then, he has also developed sacral decubitus ulcers. Was seen for MBS on 05/04/20 "deficits resulted in silent aspiration of thin liquid and nectar thick liquid by straw prior to the swallow. With nectar thick liquid by cup, only transient penetration was seen. There was significant pharyngeal residue with puree and solid consistencies which was reduced but not fully cleared with liquid wash." Now admitted for AMS due to UTI, sepsis and hypotension in a patient with indwelling Foley catheter POA and sacral decubitus ulcers POA who is quadriplegic. Pt/niece preferred to remain on nectar thick full liquids- pt stable and ST signed off. Re-ordered by hospitalist "suspicion for aspiration pna".   Subjective: alert, cooperative, pleasant, participative. Assessment / Plan / Recommendation CHL IP CLINICAL IMPRESSIONS 06/05/2020 Clinical Impression Pt demonstrates normal swallow ability. No penetration, aspiration or residue was observed. Pt at times had mild spillage of oral residue to pharynx which he sensed and swallowed, not outside of normal limits. He is able to masticate and esophageal sweep with solids appeared WNL. Will initiate a regular diet and thin liquids to allow foods of choice though pt will need total assist feeding. Will f/u for tolerance. SLP Visit  Diagnosis Dysphagia, unspecified (R13.10) Attention and concentration deficit following -- Frontal lobe and executive function deficit following -- Impact on safety and function Mild aspiration risk;Risk for inadequate nutrition/hydration   CHL IP TREATMENT RECOMMENDATION 06/05/2020 Treatment Recommendations Therapy as outlined in treatment plan below   Prognosis 06/05/2020 Prognosis for Safe Diet Advancement Good Barriers to Reach Goals -- Barriers/Prognosis Comment -- CHL IP DIET  RECOMMENDATION 06/05/2020 SLP Diet Recommendations Regular solids;Thin liquid Liquid Administration via Cup;Straw Medication Administration Whole meds with liquid Compensations Slow rate;Small sips/bites;Follow solids with liquid Postural Changes --   CHL IP OTHER RECOMMENDATIONS 06/05/2020 Recommended Consults -- Oral Care Recommendations Oral care BID Other Recommendations --   CHL IP FOLLOW UP RECOMMENDATIONS 06/05/2020 Follow up Recommendations Skilled Nursing facility   [facility] IP FREQUENCY AND DURATION 06/05/2020 Speech Therapy Frequency (ACUTE ONLY) min 2x/week Treatment Duration 2 weeks      CHL IP ORAL PHASE 06/05/2020 Oral Phase WFL Oral - Pudding Teaspoon -- Oral - Pudding Cup -- Oral - Honey Teaspoon -- Oral - Honey Cup -- Oral - Nectar Teaspoon -- Oral - Nectar Cup -- Oral - Nectar Straw -- Oral - Thin Teaspoon -- Oral - Thin Cup -- Oral - Thin Straw -- Oral - Puree -- Oral - Mech Soft -- Oral - Regular -- Oral - Multi-Consistency -- Oral - Pill -- Oral Phase - Comment --  [facility] IP PHARYNGEAL PHASE 06/05/2020 Pharyngeal Phase WFL Pharyngeal- Pudding Teaspoon -- Pharyngeal -- Pharyngeal- Pudding Cup -- Pharyngeal -- Pharyngeal- Honey Teaspoon -- Pharyngeal -- Pharyngeal- Honey Cup -- Pharyngeal -- Pharyngeal- Nectar Teaspoon -- Pharyngeal -- Pharyngeal- Nectar Cup -- Pharyngeal -- Pharyngeal- Nectar Straw -- Pharyngeal -- Pharyngeal- Thin Teaspoon -- Pharyngeal -- Pharyngeal- Thin Cup -- Pharyngeal -- Pharyngeal- Thin Straw -- Pharyngeal -- Pharyngeal- Puree -- Pharyngeal -- Pharyngeal- Mechanical Soft -- Pharyngeal -- Pharyngeal- Regular -- Pharyngeal -- Pharyngeal- Multi-consistency -- Pharyngeal -- Pharyngeal- Pill -- Pharyngeal -- Pharyngeal Comment --  [facility] IP CERVICAL ESOPHAGEAL PHASE 06/05/2020 Cervical Esophageal Phase -- Pudding Teaspoon -- Pudding Cup -- Honey Teaspoon -- Honey Cup -- Nectar Teaspoon -- Nectar Cup -- Nectar Straw -- Thin Teaspoon -- Thin Cup WFL Thin Straw WFL Puree WFL Mechanical  Soft WFL Regular -- Multi-consistency -- Pill WFL Cervical Esophageal Comment -- Harlon Ditty, MA CCC-SLP Acute Rehabilitation Services Pager [phone] Office [phone] Claudine Mouton 06/05/2020, 1:53 PM

## 2020-06-11 NOTE — Progress Notes (Signed)
Wound care done on sacral wound per order and new dressing placed.

## 2020-06-11 NOTE — Progress Notes (Signed)
Spoke with daughter and updates provided.

## 2020-06-12 MED ORDER — DOCUSATE SODIUM 100 MG PO CAPS: 100.0000 mg | ORAL_CAPSULE | Freq: Two times a day (BID) | ORAL | 0 refills | Status: DC

## 2020-06-12 MED ORDER — ORAL CARE MOUTH RINSE: 15.0000 mL | Freq: Two times a day (BID) | OROMUCOSAL | Status: DC

## 2020-06-12 MED ORDER — ENSURE ENLIVE PO LIQD: 237.0000 mL | Freq: Four times a day (QID) | ORAL | 12 refills | Status: DC

## 2020-06-12 NOTE — Progress Notes (Signed)
Hydrotherapy Discharge Note  Patient Details Name: Shannon [name] MRN: [id number] DOB: [birth year]   This patient is discharged from hydrotherapy. Nursing to continue with daily dressing changes.    Marylynn Pearson 06/12/2020, 7:07 AM   Conni Slipper, PT, DPT Acute Rehabilitation Services Pager: [phone] Office: [phone]

## 2020-06-12 NOTE — Discharge Summary (Signed)
Hilaria OtaRonald Ray [name] ZOX:[id number] DOB: [birth year] DOA: 05/21/2020  PCP: Jeoffrey MassedMcGowen, Philip H, MD  Admit date: 05/21/2020  Discharge date: 06/12/2020  Admitted From: SNF  Disposition:  SNF   Recommendations for Outpatient Follow-up:   Follow up with PCP in 1-2 weeks  PCP Please obtain BMP/CBC, 2 view CXR in 1week,  (see Discharge instructions)   PCP Please follow up on the following pending results:    Home Health: None   Equipment/Devices: None  Consultations: None  Discharge Condition: Stable    CODE STATUS: Full    Diet Recommendation: Heart Healthy with feeding assistance and aspiration precautions.     Chief Complaint  Patient presents with  . Unresponsive     Brief history of present illness from the day of admission and additional interim summary    74 y.o.malewith medical history significant ofquadriplegia, Chiari I malformation with syringomyelia, ACDF in October 2021, residual quadriplegia, then had a fall at home developed epidural hematoma, underwent reexploration, removal of hardware, evacuation of hematoma on 10/30, now quadriplegic with dysphagia, unstageable sacral decubitus wounds, failure to thrive, recurrent hospitalizations. Subsequently recently hospitalized (11/10-11/18) with aspiration pneumonia/UTI-discharged to SNF. Readmitted 11/28 with septic shock, severe frank hematuria and UTI treated with antibiotics, continuous bladder irrigation-stabilized-plans were for discharge to SNF on 12/14 however ended up with Covid positive test.                                                                 Hospital Course    COVID-19 infection: Asymptomatic-treated with MAB-apart from observation-does not require any further treatment at this point.  Septic shock due to UTI/aspiration pneumonia:  Sepsis physiology has resolved-has completed a course of antimicrobial therapy.  He remains at very high risk for recurrent aspiration pneumonia and recurrent UTIs due to chronic indwelling Foley catheter.  Note Foley catheter was changed this admission.     Acute metabolic encephalopathy: Due to sepsis/UTI-resolved.  Aspiration pneumonia/dysphagia: Stable-no new pneumonia symptoms-SLP following-upgraded to a regular diet with thin liquids.  Remains at risk for future decompensations and aspirations-given quadriplegia/deconditioning and cervical myelopathy at baseline.  Quadriplegia with dysphagia-cervical myelopathy-s/p C-spine surgery on 10/13-subsequently sustained a fall at home and developed epidural hematoma-underwent reexploration/removal of hardware and evacuation of hematoma on 10/30.  Per prior documentation-plan is to leave cervical collar for 3 months total-till mid January, thereafter to be removed by neurosurgeon Dr. Lovell SheehanJenkins.  Continues to have significant quadriplegia (able to move upper extremity side-by-side-unable to lift off the bed against gravity, able to only wiggle toes-unable to lift lower extremities off the bed against gravity).  Must follow with Dr. Lovell SheehanJenkins within 1 week of discharge.  Continue c-collar for now at all times.  Hematuria: Resolved.  Etiology felt to be UTI/trauma due to Foley catheter related urethral injury.  Required CBI-that  has since been discontinued.  Evaluated by urology with recommendations to continue with Foley catheter on discharge.   HTN: BP remains stable on midodrine all BP medications on hold  Multifactorial anemia-anemia of chronic disease/vitamin B12 deficiency/borderline iron deficiency/acute blood loss due to hematuria: Hemoglobin relatively stable-s/p 2 units of PRBC this admission.  Follow CBC periodically.  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  Next change 7 days from today.  Chronic sacral decubitus ulcers MVH:QIONG sacral decubitus wound which is unstageable. General surgery was consulted., no debridement needed, was getting hydrotherapy,transitioned back to daily dressing changes  Palliative care/goals of care: Remains a full code-appreciate palliative care evaluation this admission.  Patient's overall prognosis remains poor-he is at significant risk for repeated hospitalizations due to recurrent aspirations pneumonia/UTI/sacral decubitus ulcer infection-which potentially could be life disabling or life-threatening.  Patient remains a full code-family wants to continue with full scope of treatment at this point.  Severe protein calorie malnutrition: Withseverecachexia weight loss.on protein supplementation.   Discharge diagnosis     Principal Problem:   Sepsis secondary to UTI [facility]) Active Problems:   Benign essential HTN   Quadriplegia and quadriparesis (HCC)   Essential hypertension   Myelopathy (HCC)   Chronic indwelling Foley catheter   Pressure injury of skin   Palliative care by specialist   Goals of care, counseling/discussion    Discharge instructions    Discharge Instructions    Change dressing (specify)   Complete by: As directed    Continue foam dressing for protection from C-Collar. Peel down all sites with a foam dressing EACH shift.  Record your observations.  Change foam dressing every 3 days or PRN soiling.   Diet - low sodium heart healthy   Complete by: As directed    Discharge instructions   Complete by: As directed    Follow with Primary MD McGowen, Maryjean Morn, MD in 7 days   Get CBC, CMP, 2 view Chest X ray -  checked next visit within 1 week by Primary MD or SNF MD   Activity: As tolerated with Full fall precautions use walker/cane & assistance as needed  Disposition SNF  Diet: Heart Healthy  with feeding assistance and aspiration precautions.  Special Instructions: If you  have smoked or chewed Tobacco  in the last 2 yrs please stop smoking, stop any regular Alcohol  and or any Recreational drug use.  On your next visit with your primary care physician please Get Medicines reviewed and adjusted.  Please request your Prim.MD to go over all Hospital Tests and Procedure/Radiological results at the follow up, please get all Hospital records sent to your Prim MD by signing hospital release before you go home.  If you experience worsening of your admission symptoms, develop shortness of breath, life threatening emergency, suicidal or homicidal thoughts you must seek medical attention immediately by calling 911 or calling your MD immediately  if symptoms less severe.  You Must read complete instructions/literature along with all the possible adverse reactions/side effects for all the Medicines you take and that have been prescribed to you. Take any new Medicines after you have completely understood and accpet all the possible adverse reactions/side effects.   Discharge wound care:   Complete by: As directed    Apply Santyl to sacral wound daily then cover with moist gauze packing and ABD pad and tape   Increase activity slowly   Complete by: As directed  Discharge Medications   Allergies as of 06/12/2020      Reactions   Iodine Swelling   Penicillins Swelling   ** tolerates cephalosporins Facial swelling, itchy throat      Medication List    STOP taking these medications   food thickener Powd Commonly known as: SIMPLYTHICK   guaiFENesin 600 MG 12 hr tablet Commonly known as: MUCINEX   ibuprofen 600 MG tablet Commonly known as: ADVIL     TAKE these medications   acetaminophen 500 MG tablet Commonly known as: TYLENOL Take 1,000 mg by mouth 3 (three) times daily.   albuterol 108 (90 Base) MCG/ACT inhaler Commonly known as: VENTOLIN HFA Inhale 2 puffs into the lungs every morning.   ascorbic acid 500 MG tablet Commonly known as: VITAMIN  C Take 1,000 mg by mouth every morning.   aspirin 325 MG tablet Take 325 mg by mouth every morning.   docusate sodium 100 MG capsule Commonly known as: COLACE Take 1 capsule (100 mg total) by mouth 2 (two) times daily.   feeding supplement Liqd Take 237 mLs by mouth 4 (four) times daily.   ferrous sulfate 325 (65 FE) MG tablet Take 1 tablet (325 mg total) by mouth 2 (two) times daily with a meal.   midodrine 10 MG tablet Commonly known as: PROAMATINE Take 1 tablet (10 mg total) by mouth 2 (two) times daily with a meal.   oxyCODONE 5 MG immediate release tablet Commonly known as: Oxy IR/ROXICODONE Take 1 tablet (5 mg total) by mouth every 8 (eight) hours as needed for moderate pain or severe pain.   pantoprazole 40 MG tablet Commonly known as: PROTONIX Take 1 tablet (40 mg total) by mouth at bedtime.   polyethylene glycol 17 g packet Commonly known as: MIRALAX / GLYCOLAX Take 17 g by mouth every morning. Mix in 6 oz water and drink   tamsulosin 0.4 MG Caps capsule Commonly known as: FLOMAX Take 1 capsule (0.4 mg total) by mouth daily.            Discharge Care Instructions  (From admission, onward)         Start     Ordered   06/12/20 0000  Change dressing (specify)       Comments: Continue foam dressing for protection from C-Collar. Peel down all sites with a foam dressing EACH shift.  Record your observations.  Change foam dressing every 3 days or PRN soiling.   06/12/20 0943   06/06/20 0000  Discharge wound care:       Comments: Apply Santyl to sacral wound daily then cover with moist gauze packing and ABD pad and tape   06/06/20 0918           Contact information for follow-up providers    McGowen, Maryjean Morn, MD. Schedule an appointment as soon as possible for a visit in 1 week(s).   Specialty: Family Medicine Contact information: [address] [place] [address] [phone]        Palliative care Follow up in 2 week(s).         Tressie Stalker, MD. Schedule an appointment as soon as possible for a visit in 1 week(s).   Specialty: Neurosurgery Why: C-spine injury Contact information: [address] [place] [address] [phone]            Contact information for after-discharge care    Destination    [facility] Preferred SNF .   Service:  Skilled Nursing Contact information: [address] [place] [address] [phone]                  Major procedures and Radiology Reports - PLEASE review detailed and final reports thoroughly  -        DG Chest 2 View  Result Date: 05/31/2020 CLINICAL DATA:  Fever, hypertension, former smoker EXAM: CHEST - 2 VIEW COMPARISON:  05/30/2020 FINDINGS: Normal heart size, mediastinal contours, and pulmonary vascularity. RIGHT greater than LEFT basilar opacities question pneumonia versus atelectasis little changed. Small bibasilar effusions larger on RIGHT. No pneumothorax. Degenerative changes of LEFT glenohumeral joint. IMPRESSION: Persistent bibasilar opacities question atelectasis versus pneumonia with small bibasilar effusions, both greater on RIGHT. Electronically Signed   By: Ulyses Southward M.D.   On: 05/31/2020 08:56   CT Head Wo Contrast  Result Date: 05/21/2020 CLINICAL DATA:  Mental status change. EXAM: CT HEAD WITHOUT CONTRAST TECHNIQUE: Contiguous axial images were obtained from the base of the skull through the vertex without intravenous contrast. COMPARISON:  May 03, 2020 FINDINGS: Brain: No evidence of acute infarction, hemorrhage, hydrocephalus, extra-axial collection or mass lesion/mass effect. Mild brain parenchymal volume loss and deep white matter microangiopathy. Vascular: No hyperdense vessel or unexpected calcification. Skull: Normal. Negative for fracture or focal lesion. Sinuses/Orbits: No acute finding. Other: None. IMPRESSION: 1. No acute intracranial abnormality. 2. Mild brain parenchymal atrophy and  chronic microvascular disease. Electronically Signed   By: Ted Mcalpine M.D.   On: 05/21/2020 17:53   DG CHEST PORT 1 VIEW  Result Date: 06/06/2020 CLINICAL DATA:  Hypoxia EXAM: PORTABLE CHEST 1 VIEW COMPARISON:  05/31/2020 and prior FINDINGS: No pneumothorax. Patchy bibasilar opacities and small effusions, unchanged. Stable cardiomediastinal silhouette and osseous structures. IMPRESSION: Bibasilar opacities and small effusions, unchanged. Electronically Signed   By: Stana Bunting M.D.   On: 06/06/2020 19:17   DG CHEST PORT 1 VIEW  Result Date: 05/30/2020 CLINICAL DATA:  Fever chest pain EXAM: PORTABLE CHEST 1 VIEW COMPARISON:  05/21/2020, 05/08/2020, 05/06/2020 FINDINGS: Low lung volumes with elevation of right diaphragm. Similar right greater than left basilar consolidations. Possible small pleural effusions. Stable cardiomediastinal silhouette. No pneumothorax IMPRESSION: Similar appearance of right greater than left basilar consolidations, atelectasis versus pneumonia, with possible small pleural effusions. Electronically Signed   By: Jasmine Pang M.D.   On: 05/30/2020 19:16   DG Chest Port 1 View  Result Date: 05/21/2020 CLINICAL DATA:  Found unresponsive. EXAM: PORTABLE CHEST 1 VIEW COMPARISON:  May 08, 2020 FINDINGS: There is stable elevation of the right hemidiaphragm. Mild bilateral infrahilar atelectasis is seen. There is no evidence of a pleural effusion or pneumothorax. The heart size and mediastinal contours are within normal limits. The visualized skeletal structures are unremarkable. IMPRESSION: Mild bilateral infrahilar atelectasis. Electronically Signed   By: Aram Candela M.D.   On: 05/21/2020 16:34   DG Swallowing Func-Speech Pathology  Result Date: 06/05/2020 Objective Swallowing Evaluation: Type of Study: MBS-Modified Barium Swallow Study  Patient Details Name: Shannon [name] MRN: [id number] Date of Birth: Apr 13, 1946 Today's Date: 06/05/2020 Time: SLP  Start Time (ACUTE ONLY): 1300 -SLP Stop Time (ACUTE ONLY): 1316 SLP Time Calculation (min) (ACUTE ONLY): 16 min Past Medical History: Past Medical History: Diagnosis Date . Asthma  . 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 Newell Coral, 1992-->surg at [facility].  Summer 2021->Cervicalgia,arm pain, hand atrophy RUE wkness, hyperreflex (Dr. Sharyn Creamer MRI: extensive cord atrophy  and spinal and foraminal stenosis->to get surgery 03/2020 . Hay fever  . Hypertension  . Osteoarthritis of both hands  Past Surgical History: Past Surgical History: Procedure Laterality Date . ANTERIOR CERVICAL DECOMP/DISCECTOMY FUSION N/A 04/05/2020  Procedure: ANTERIOR CERVICAL DECOMPRESSION/DISCECTOMY FUSION, INTERBODY PROSTHESIS, PLATE/SCREWS CERVICAL THREE-CERVICAL FOUR, CERVICAL FOUR- CERVICAL FIVE;  Surgeon: Tressie Stalker, MD;  Location: [facility] OR;  Service: Neurosurgery;  Laterality: N/A; . ANTERIOR CERVICAL DECOMP/DISCECTOMY FUSION N/A 04/22/2020  Procedure: Reexploration of anterior cervical wound for epidural hematoma;  Surgeon: Donalee Citrin, MD;  Location: [facility] OR;  Service: Neurosurgery;  Laterality: N/A; . BACK SURGERY   . INCISION AND DRAINAGE Left 2012  L hand infection . ROTATOR CUFF REPAIR Right   x 3 . SPINE SURGERY   HPI: Cayleb Jarnigan Smithis a 74 y.o.malewith medical history significant ofquadriplegia which follows surgery for Chiari I malformation with syringomyelia surgery in October 2021.  He originally had a C3-4 [place] 4-5 anterior cervical disc ACDF with decompression, C3 3-4 and C4-5 interbody arthrodesis with local autograft bone, anterior cervical plating of C3-C5 on 04/05/2020.  He then went home and had a fall from his bed, he was then hospitalized and from here he was sent to a nursing home, he has been quadriplegic since then, he has also developed sacral decubitus ulcers. Was seen for MBS on 05/04/20 "deficits resulted in silent aspiration  of thin liquid and nectar thick liquid by straw prior to the swallow. With nectar thick liquid by cup, only transient penetration was seen. There was significant pharyngeal residue with puree and solid consistencies which was reduced but not fully cleared with liquid wash." Now admitted for AMS due to UTI, sepsis and hypotension in a patient with indwelling Foley catheter POA and sacral decubitus ulcers POA who is quadriplegic. Pt/niece preferred to remain on nectar thick full liquids- pt stable and ST signed off. Re-ordered by hospitalist "suspicion for aspiration pna".   Subjective: alert, cooperative, pleasant, participative. Assessment / Plan / Recommendation CHL IP CLINICAL IMPRESSIONS 06/05/2020 Clinical Impression Pt demonstrates normal swallow ability. No penetration, aspiration or residue was observed. Pt at times had mild spillage of oral residue to pharynx which he sensed and swallowed, not outside of normal limits. He is able to masticate and esophageal sweep with solids appeared WNL. Will initiate a regular diet and thin liquids to allow foods of choice though pt will need total assist feeding. Will f/u for tolerance. SLP Visit Diagnosis Dysphagia, unspecified (R13.10) Attention and concentration deficit following -- Frontal lobe and executive function deficit following -- Impact on safety and function Mild aspiration risk;Risk for inadequate nutrition/hydration   CHL IP TREATMENT RECOMMENDATION 06/05/2020 Treatment Recommendations Therapy as outlined in treatment plan below   Prognosis 06/05/2020 Prognosis for Safe Diet Advancement Good Barriers to Reach Goals -- Barriers/Prognosis Comment -- CHL IP DIET RECOMMENDATION 06/05/2020 SLP Diet Recommendations Regular solids;Thin liquid Liquid Administration via Cup;Straw Medication Administration Whole meds with liquid Compensations Slow rate;Small sips/bites;Follow solids with liquid Postural Changes --   CHL IP OTHER RECOMMENDATIONS 06/05/2020 Recommended  Consults -- Oral Care Recommendations Oral care BID Other Recommendations --   CHL IP FOLLOW UP RECOMMENDATIONS 06/05/2020 Follow up Recommendations Skilled Nursing facility   [facility] IP FREQUENCY AND DURATION 06/05/2020 Speech Therapy Frequency (ACUTE ONLY) min 2x/week Treatment Duration 2 weeks      CHL IP ORAL PHASE 06/05/2020 Oral Phase WFL Oral - Pudding Teaspoon -- Oral - Pudding Cup -- Oral - Honey Teaspoon -- Oral - Honey Cup -- Oral - Nectar  Teaspoon -- Oral - Nectar Cup -- Oral - Nectar Straw -- Oral - Thin Teaspoon -- Oral - Thin Cup -- Oral - Thin Straw -- Oral - Puree -- Oral - Mech Soft -- Oral - Regular -- Oral - Multi-Consistency -- Oral - Pill -- Oral Phase - Comment --  CHL IP PHARYNGEAL PHASE 06/05/2020 Pharyngeal Phase WFL Pharyngeal- Pudding Teaspoon -- Pharyngeal -- Pharyngeal- Pudding Cup -- Pharyngeal -- Pharyngeal- Honey Teaspoon -- Pharyngeal -- Pharyngeal- Honey Cup -- Pharyngeal -- Pharyngeal- Nectar Teaspoon -- Pharyngeal -- Pharyngeal- Nectar Cup -- Pharyngeal -- Pharyngeal- Nectar Straw -- Pharyngeal -- Pharyngeal- Thin Teaspoon -- Pharyngeal -- Pharyngeal- Thin Cup -- Pharyngeal -- Pharyngeal- Thin Straw -- Pharyngeal -- Pharyngeal- Puree -- Pharyngeal -- Pharyngeal- Mechanical Soft -- Pharyngeal -- Pharyngeal- Regular -- Pharyngeal -- Pharyngeal- Multi-consistency -- Pharyngeal -- Pharyngeal- Pill -- Pharyngeal -- Pharyngeal Comment --  CHL IP CERVICAL ESOPHAGEAL PHASE 06/05/2020 Cervical Esophageal Phase -- Pudding Teaspoon -- Pudding Cup -- Honey Teaspoon -- Honey Cup -- Nectar Teaspoon -- Nectar Cup -- Nectar Straw -- Thin Teaspoon -- Thin Cup WFL Thin Straw WFL Puree WFL Mechanical Soft WFL Regular -- Multi-consistency -- Pill WFL Cervical Esophageal Comment -- Harlon Ditty, MA CCC-SLP Acute Rehabilitation Services Pager [phone] Office [phone] [place], Riley Nearing 06/05/2020, 1:53 PM               Micro Results     Recent Results (from the past 240 hour(s))   SARS Coronavirus 2 by RT PCR (hospital order, performed in [facility] hospital lab) Nasopharyngeal Nasopharyngeal Swab     Status: Abnormal   Collection Time: 06/06/20 12:04 PM   Specimen: Nasopharyngeal Swab  Result Value Ref Range Status   SARS Coronavirus 2 POSITIVE (A) NEGATIVE Final    Comment: RESULT CALLED TO, READ BACK BY AND VERIFIED WITH: Vernie Ammons RN [id number] 06/06/20 A BROWNING (NOTE) SARS-CoV-2 target nucleic acids are DETECTED  SARS-CoV-2 RNA is generally detectable in upper respiratory specimens  during the acute phase of infection.  Positive results are indicative  of the presence of the identified virus, but do not rule out bacterial infection or co-infection with other pathogens not detected by the test.  Clinical correlation with patient history and  other diagnostic information is necessary to determine patient infection status.  The expected result is negative.  Fact Sheet for Patients:   BoilerBrush.com.cy   Fact Sheet for Healthcare Providers:   https://pope.com/    This test is not yet approved or cleared by the [place] FDA and  has been authorized for detection and/or diagnosis of SARS-CoV-2 by FDA under an Emergency Use Authorization (EUA).  This EUA will remain in effect (meaning t his test can be used) for the duration of  the COVID-19 declaration under Section 564(b)(1) of the Act, 21 U.S.C. section 360-bbb-3(b)(1), unless the authorization is terminated or revoked sooner.  Performed at [facility], [address] [place], [place] [address]   SARS Coronavirus 2 by RT PCR (hospital order, performed in [facility] hospital lab) Nasopharyngeal Nasopharyngeal Swab     Status: Abnormal   Collection Time: 06/06/20  3:46 PM   Specimen: Nasopharyngeal Swab  Result Value Ref Range Status   SARS Coronavirus 2 POSITIVE (A) NEGATIVE Final    Comment: RESULT CALLED TO, READ BACK BY AND VERIFIED  WITH: Vernie Ammons RN [id number] 06/06/20 A BROWNING (NOTE) SARS-CoV-2 target nucleic acids are DETECTED  SARS-CoV-2 RNA is generally detectable in upper respiratory specimens  during the acute phase of infection.  Positive results are indicative  of the presence of the identified virus, but do not rule out bacterial infection or co-infection with other pathogens not detected by the test.  Clinical correlation with patient history and  other diagnostic information is necessary to determine patient infection status.  The expected result is negative.  Fact Sheet for Patients:   BoilerBrush.com.cy   Fact Sheet for Healthcare Providers:   https://pope.com/    This test is not yet approved or cleared by the [place] FDA and  has been authorized for detection and/or diagnosis of SARS-CoV-2 by FDA under an Emergency Use Authorization (EUA).  This EUA will remain in effect (meaning t his test can be used) for the duration of  the COVID-19 declaration under Section 564(b)(1) of the Act, 21 U.S.C. section 360-bbb-3(b)(1), unless the authorization is terminated or revoked sooner.  Performed at [facility], [address], [place], [place] [address]     Today   Subjective    Shannon [name] today has no headache,no chest abdominal pain,no new weakness tingling or numbness, feels much better     Objective   Blood pressure 115/67, pulse 90, temperature 99 F (37.2 C), temperature source Axillary, resp. rate 20, height 6' (1.829 m), weight 106.6 kg, SpO2 98 %.   Intake/Output Summary (Last 24 hours) at 06/12/2020 0944 Last data filed at 06/12/2020 0819 Gross per 24 hour  Intake 840 ml  Output 350 ml  Net 490 ml    Exam Awake Alert, wearing c-collar, quadriplegic at baseline, [place].AT,PERRAL Supple Neck,No JVD, No cervical lymphadenopathy appriciated.  Symmetrical Chest wall movement, Good air movement bilaterally, CTAB RRR,No  Gallops, Rubs or new Murmurs, No Parasternal Heave +ve B.Sounds, Abd Soft, No tenderness, No organomegaly appriciated, No rebound - guarding or rigidity. No Cyanosis, Clubbing or edema, No new Rash or bruise    Data Review   CBC w Diff:  Lab Results  Component Value Date   WBC 6.5 06/11/2020   HGB 8.7 (L) 06/11/2020   HCT 28.3 (L) 06/11/2020   PLT 222 06/11/2020   LYMPHOPCT 17 05/26/2020   MONOPCT 7 05/26/2020   EOSPCT 1 05/26/2020   BASOPCT 1 05/26/2020    CMP:  Lab Results  Component Value Date   NA 137 06/11/2020   K 3.8 06/11/2020   CL 102 06/11/2020   CO2 30 06/11/2020   BUN 10 06/11/2020   CREATININE 0.51 (L) 06/11/2020   PROT 4.7 (L) 06/07/2020   ALBUMIN 1.3 (L) 06/07/2020   BILITOT 0.6 06/07/2020   ALKPHOS 80 06/07/2020   AST 70 (H) 06/07/2020   ALT 47 (H) 06/07/2020  .   Total Time in preparing paper work, data evaluation and todays exam - 35 minutes  Susa Raring M.D on 06/12/2020 at 9:44 AM  [facility]

## 2020-06-12 NOTE — Consult Note (Signed)
WOC Nurse wound follow up Wound type:MDRPI to right neck/clavicle from C collar.  Continuing to pad all bony prominences with silicone foam dressing.  No open wounds.   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 Present on admission.   Continue to use Santyl daily.  Is getting ready for discharge to SNF.  Active orders on file for ongoing care of open wound and protectoin under medical device ( C collar)  No further needs at this time.  Will not follow at this time.  Please re-consult if needed.  Maple Hudson MSN, RN, FNP-BC CWON Wound, Ostomy, Continence Nurse Pager [phone]

## 2020-06-12 NOTE — Progress Notes (Signed)
[name] to be D/C'd to [facility] per MD order. Discussed with Timba, RN at [facility] and all questions fully answered. VVS, dressings intact over unstageable wound to sacrum and foam dressing in place surrounding cervical collar. Foley emptied and left in place. IV catheter discontinued intact. Site without signs and symptoms of complications. Dressing and pressure applied.  An After Visit Summary was printed and given to the patient.  Patient escorted via stretcher, and D/C to  [facility] via [place].  Jon Gills  06/12/2020 12:42 PM

## 2020-06-12 NOTE — TOC Transition Note (Signed)
Transition of Care [facility]) - CM/SW Discharge Note   Patient Details  Name: Shannon [name] MRN: [id number] Date of Birth: 1945/11/22  Transition of Care [facility]) CM/SW Contact:  Mearl Latin, LCSW Phone Number: 06/12/2020, 10:24 AM   Clinical Narrative:    Patient will DC to: [facility] Anticipated DC date: 06/12/20 Family notified: Left vm for dtr, [name] by: Sharin Mons   Per MD patient ready for DC to [facility]. RN to call report prior to discharge [phone] Room 808P). RN, patient, patient's family, and facility notified of DC. Discharge Summary and FL2 sent to facility. DC packet on chart. Ambulance transport requested for patient.   CSW will sign off for now as social work intervention is no longer needed. Please consult [place] again if new needs arise.        Barriers to Discharge: Barriers Resolved   Patient Goals and CMS Choice Patient states their goals for this hospitalization and ongoing recovery are:: Rehab CMS Medicare.gov Compare Post Acute Care list provided to::  (daughter OK with pt returning to [facility]) Choice offered to / list presented to : Patient  Discharge Placement   Existing PASRR number confirmed : 06/12/20          Patient chooses bed at: [facility] Patient to be transferred to facility by: [facility] Name of family member notified: [name], dtr Patient and family notified of of transfer: 06/12/20  Discharge Plan and Services                                     Social Determinants of Health (SDOH) Interventions     Readmission Risk Interventions No flowsheet data found.

## 2020-06-12 NOTE — Discharge Instructions (Signed)
Follow with Primary MD McGowen, Maryjean Morn, MD in 7 days   Get CBC, CMP, 2 view Chest X ray -  checked next visit within 1 week by Primary MD or SNF MD   Activity: As tolerated with Full fall precautions use walker/cane & assistance as needed  Disposition SNF  Diet: Heart Healthy  with feeding assistance and aspiration precautions.  Special Instructions: If you have smoked or chewed Tobacco  in the last 2 yrs please stop smoking, stop any regular Alcohol  and or any Recreational drug use.  On your next visit with your primary care physician please Get Medicines reviewed and adjusted.  Please request your Prim.MD to go over all Hospital Tests and Procedure/Radiological results at the follow up, please get all Hospital records sent to your Prim MD by signing hospital release before you go home.  If you experience worsening of your admission symptoms, develop shortness of breath, life threatening emergency, suicidal or homicidal thoughts you must seek medical attention immediately by calling 911 or calling your MD immediately  if symptoms less severe.  You Must read complete instructions/literature along with all the possible adverse reactions/side effects for all the Medicines you take and that have been prescribed to you. Take any new Medicines after you have completely understood and accpet all the possible adverse reactions/side effects.

## 2020-06-12 NOTE — TOC Progression Note (Signed)
Transition of Care [facility]) - Progression Note    Patient Details  Name: Shannon [name] MRN: [id number] Date of Birth: September 14, 1945  Transition of Care [facility]) CM/SW Contact  Mearl Latin, LCSW Phone Number: 06/12/2020, 10:21 AM  Clinical Narrative:    [facility] has a bed available for the patient today. Will arrange PTAR.    Expected Discharge Plan: Skilled Nursing Facility Barriers to Discharge: Continued Medical Work up  Expected Discharge Plan and Services Expected Discharge Plan: Skilled Nursing Facility       Living arrangements for the past 2 months: Skilled Nursing Facility [facility]) Expected Discharge Date: 06/12/20                                     Social Determinants of Health (SDOH) Interventions    Readmission Risk Interventions No flowsheet data found.

## 2020-06-16 ENCOUNTER — Encounter (HOSPITAL_COMMUNITY): Payer: Self-pay

## 2020-06-16 ENCOUNTER — Other Ambulatory Visit: Payer: Self-pay

## 2020-06-16 ENCOUNTER — Emergency Department (HOSPITAL_COMMUNITY)
Admission: EM | Admit: 2020-06-16 | Discharge: 2020-06-16 | Disposition: A | Discharge status: Home / Self Care | Payer: Medicare Other | Source: Home / Self Care | Attending: Emergency Medicine | Admitting: Emergency Medicine

## 2020-06-16 DIAGNOSIS — Z87891 Personal history of nicotine dependence: Secondary | ICD-10-CM | POA: Insufficient documentation

## 2020-06-16 DIAGNOSIS — Z7982 Long term (current) use of aspirin: Secondary | ICD-10-CM | POA: Insufficient documentation

## 2020-06-16 DIAGNOSIS — R310 Gross hematuria: Secondary | ICD-10-CM | POA: Insufficient documentation

## 2020-06-16 DIAGNOSIS — T839XXA Unspecified complication of genitourinary prosthetic device, implant and graft, initial encounter: Secondary | ICD-10-CM

## 2020-06-16 DIAGNOSIS — J45909 Unspecified asthma, uncomplicated: Secondary | ICD-10-CM | POA: Insufficient documentation

## 2020-06-16 DIAGNOSIS — I10 Essential (primary) hypertension: Secondary | ICD-10-CM | POA: Insufficient documentation

## 2020-06-16 DIAGNOSIS — Y846 Urinary catheterization as the cause of abnormal reaction of the patient, or of later complication, without mention of misadventure at the time of the procedure: Secondary | ICD-10-CM | POA: Insufficient documentation

## 2020-06-16 DIAGNOSIS — T83098A Other mechanical complication of other indwelling urethral catheter, initial encounter: Secondary | ICD-10-CM | POA: Insufficient documentation

## 2020-06-16 LAB — URINALYSIS, ROUTINE W REFLEX MICROSCOPIC
Bacteria, UA: NONE SEEN
Bilirubin Urine: NEGATIVE
Glucose, UA: NEGATIVE mg/dL
Ketones, ur: NEGATIVE mg/dL
Leukocytes,Ua: NEGATIVE
Nitrite: NEGATIVE
Protein, ur: 30 mg/dL — AB
RBC / HPF: >50 RBC/hpf — ABNORMAL HIGH (ref 0–5)
Specific Gravity, Urine: 1.004 — ABNORMAL LOW (ref 1.005–1.030)
pH: 6 (ref 5.0–8.0)

## 2020-06-16 NOTE — ED Notes (Signed)
Patient resting in bed. Discharge pending PTAR pickup. Writer notified patient of this.

## 2020-06-16 NOTE — ED Notes (Signed)
Report given to [facility] at [facility].  Explained that pt is being discharged with a 3 way catheter and needs to follow up with urology.  Pt resting quietly;y in bed.  NADN.

## 2020-06-16 NOTE — ED Provider Notes (Signed)
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  He was sent from his living facility for Foley placement.  He is not sure why he was sent here but states they were "messing with it earlier" referring to his Foley catheter.  He is now having some bleeding from the tip of his penis.  He states "of course it would be bleeding if they have been messing with it like they did".  He is not sure exactly what they did and EMS was unable to report with the living facility had done.  He denies pain.  He is reportedly Covid positive.  Past Medical History:  Diagnosis Date  . Asthma   . 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 Newell Coral, 1992-->surg at [facility].  Summer 2021->Cervicalgia,arm pain, hand atrophy RUE wkness, hyperreflex (Dr. 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 Hay fever   . Hypertension   . Osteoarthritis of both hands     Past Surgical History:  Procedure Laterality Date  . ANTERIOR CERVICAL DECOMP/DISCECTOMY FUSION N/A 04/05/2020   Procedure: ANTERIOR CERVICAL DECOMPRESSION/DISCECTOMY FUSION, INTERBODY PROSTHESIS, PLATE/SCREWS CERVICAL THREE-CERVICAL FOUR, CERVICAL FOUR- CERVICAL FIVE;  Surgeon: Tressie Stalker, MD;  Location: [facility] OR;  Service: Neurosurgery;  Laterality: N/A;  . ANTERIOR CERVICAL DECOMP/DISCECTOMY FUSION N/A 04/22/2020   Procedure: Reexploration of anterior cervical wound for epidural hematoma;  Surgeon: Donalee Citrin, MD;  Location: [facility] OR;  Service: Neurosurgery;  Laterality: N/A;  . BACK SURGERY    . INCISION AND DRAINAGE Left 2012   L hand infection  . ROTATOR CUFF REPAIR Right    x 3  . SPINE  SURGERY      Family History  Problem Relation Age of Onset  . Heart attack Mother   . Heart disease Mother   . High blood pressure Mother   . Alcohol abuse Father   . Cancer Father   . Diabetes Father   . High blood pressure Father     Social History   Tobacco Use  . Smoking status: Former Games developer  . Smokeless tobacco: Never Used  Vaping Use  . Vaping Use: Never used  Substance Use Topics  . Alcohol use: Not Currently  . Drug use: Never    Prior to Admission medications   Medication Sig Start Date End Date Taking? Authorizing Provider  acetaminophen (TYLENOL) 500 MG tablet Take 1,000 mg by mouth 3 (three) times daily.     [provider]  aspirin 325 MG tablet Take 325 mg by mouth every morning.    [provider]  docusate sodium (COLACE) 100 MG capsule Take 1 capsule (100 mg total) by mouth 2 (two) times daily. 06/12/20   Leroy Sea, MD  feeding supplement (ENSURE ENLIVE / ENSURE PLUS) LIQD Take 237 mLs by mouth 4 (four) times daily. 06/12/20   Leroy Sea, MD  ferrous sulfate 325 (65 FE) MG tablet Take 1 tablet (325 mg total) by mouth 2 (two) times daily with a meal. 06/06/20   Zannie Cove, MD  midodrine (PROAMATINE) 10 MG tablet Take 1 tablet (10 mg total) by mouth 2 (two) times daily with a meal. 06/06/20  Zannie Cove, MD  oxyCODONE (OXY IR/ROXICODONE) 5 MG immediate release tablet Take 1 tablet (5 mg total) by mouth every 8 (eight) hours as needed for moderate pain or severe pain. 06/06/20   Zannie Cove, MD  pantoprazole (PROTONIX) 40 MG tablet Take 1 tablet (40 mg total) by mouth at bedtime. 04/19/20   Love, [name], PA-C  polyethylene glycol (MIRALAX / GLYCOLAX) 17 g packet Take 17 g by mouth every morning. Mix in 6 oz water and drink    [provider]  tamsulosin (FLOMAX) 0.4 MG CAPS capsule Take 1 capsule (0.4 mg total) by mouth daily. 04/28/20   Tressie Stalker, MD    Allergies Iodine and Penicillins   REVIEW  OF SYSTEMS  Negative except as noted here or in the History of Present Illness.   PHYSICAL EXAMINATION  Initial Vital Signs Blood pressure 116/70, pulse (!) 103, temperature 98.1 F (36.7 C), resp. rate 18, SpO2 100 %.  Examination General: Well-developed, well-nourished male in no acute distress; appearance consistent with age of record HENT: normocephalic; atraumatic Eyes: pupils equal, round and reactive to light; extraocular muscles intact Neck: supple Heart: regular rate and rhythm Lungs: clear to auscultation bilaterally Abdomen: soft; nondistended; suprapubic tenderness; bowel sounds present GU: Tanner V male, circumcised; blood at urethral meatus Extremities: No acute deformity; feet and orthotic devices Neurologic: Awake, alert and oriented; quadriparesis  Skin: Warm and dry Psychiatric: Normal mood and affect   RESULTS  Summary of this visit's results, reviewed and interpreted by myself:   EKG Interpretation  Date/Time:    Ventricular Rate:    PR Interval:    QRS Duration:   QT Interval:    QTC Calculation:   R Axis:     Text Interpretation:        Laboratory Studies: No results found for this or any previous visit (from the past 24 hour(s)). Imaging Studies: No results found.  ED COURSE and MDM  Nursing notes, initial and subsequent vitals signs, including pulse oximetry, reviewed and interpreted by myself.  Vitals:   06/16/20 0152  BP: 116/70  Pulse: (!) 103  Resp: 18  Temp: 98.1 F (36.7 C)  SpO2: 100%   Medications - No data to display  2:37 AM Foley catheter placed by nurse.  Gross blood return.  Foley catheter to be irrigated.  3:57 AM Bladder irrigated.  Irrigant now only blood-tinged.  We will send patient back to his living facility with Foley catheter in place.  Irrigant sent for culture.  Suspect patient's Foley dysfunction due to blood clot possibly due to manipulation of Foley.  Will refer to urology if symptoms  persist.  PROCEDURES  Procedures   ED DIAGNOSES     ICD-10-CM   1. Complication of Foley catheter, initial encounter (HCC)  T83.9XXA   2. Gross hematuria  R31.0        Issachar Broady, MD 06/16/20 0400

## 2020-06-17 LAB — URINE CULTURE: Culture: NO GROWTH

## 2020-06-19 ENCOUNTER — Inpatient Hospital Stay (HOSPITAL_COMMUNITY)
Admission: EM | Admit: 2020-06-19 | Discharge: 2020-06-26 | DRG: 698 | Disposition: A | Discharge status: Skilled Nursing Facility | Payer: Medicare Other | Source: Skilled Nursing Facility | Attending: Internal Medicine | Admitting: Internal Medicine

## 2020-06-19 ENCOUNTER — Other Ambulatory Visit: Payer: Self-pay

## 2020-06-19 ENCOUNTER — Emergency Department (HOSPITAL_COMMUNITY): Payer: Medicare Other

## 2020-06-19 ENCOUNTER — Encounter (HOSPITAL_COMMUNITY): Payer: Self-pay | Admitting: *Deleted

## 2020-06-19 DIAGNOSIS — Z8249 Family history of ischemic heart disease and other diseases of the circulatory system: Secondary | ICD-10-CM

## 2020-06-19 DIAGNOSIS — R54 Age-related physical debility: Secondary | ICD-10-CM | POA: Diagnosis present

## 2020-06-19 DIAGNOSIS — L89159 Pressure ulcer of sacral region, unspecified stage: Secondary | ICD-10-CM | POA: Diagnosis present

## 2020-06-19 DIAGNOSIS — B9689 Other specified bacterial agents as the cause of diseases classified elsewhere: Secondary | ICD-10-CM | POA: Diagnosis present

## 2020-06-19 DIAGNOSIS — N029 Recurrent and persistent hematuria with unspecified morphologic changes: Secondary | ICD-10-CM | POA: Diagnosis present

## 2020-06-19 DIAGNOSIS — G935 Compression of brain: Secondary | ICD-10-CM | POA: Diagnosis present

## 2020-06-19 DIAGNOSIS — R319 Hematuria, unspecified: Secondary | ICD-10-CM | POA: Diagnosis not present

## 2020-06-19 DIAGNOSIS — E538 Deficiency of other specified B group vitamins: Secondary | ICD-10-CM | POA: Diagnosis present

## 2020-06-19 DIAGNOSIS — Z87891 Personal history of nicotine dependence: Secondary | ICD-10-CM

## 2020-06-19 DIAGNOSIS — I10 Essential (primary) hypertension: Secondary | ICD-10-CM | POA: Diagnosis present

## 2020-06-19 DIAGNOSIS — G959 Disease of spinal cord, unspecified: Secondary | ICD-10-CM | POA: Diagnosis not present

## 2020-06-19 DIAGNOSIS — R1310 Dysphagia, unspecified: Secondary | ICD-10-CM | POA: Diagnosis present

## 2020-06-19 DIAGNOSIS — K8020 Calculus of gallbladder without cholecystitis without obstruction: Secondary | ICD-10-CM | POA: Diagnosis present

## 2020-06-19 DIAGNOSIS — N3001 Acute cystitis with hematuria: Secondary | ICD-10-CM

## 2020-06-19 DIAGNOSIS — L89321 Pressure ulcer of left buttock, stage 1: Secondary | ICD-10-CM | POA: Diagnosis present

## 2020-06-19 DIAGNOSIS — N200 Calculus of kidney: Secondary | ICD-10-CM | POA: Diagnosis present

## 2020-06-19 DIAGNOSIS — L89154 Pressure ulcer of sacral region, stage 4: Secondary | ICD-10-CM | POA: Diagnosis not present

## 2020-06-19 DIAGNOSIS — Z515 Encounter for palliative care: Secondary | ICD-10-CM

## 2020-06-19 DIAGNOSIS — N390 Urinary tract infection, site not specified: Secondary | ICD-10-CM | POA: Diagnosis present

## 2020-06-19 DIAGNOSIS — U071 COVID-19: Secondary | ICD-10-CM | POA: Diagnosis present

## 2020-06-19 DIAGNOSIS — Z978 Presence of other specified devices: Secondary | ICD-10-CM | POA: Diagnosis not present

## 2020-06-19 DIAGNOSIS — Z6831 Body mass index (BMI) 31.0-31.9, adult: Secondary | ICD-10-CM

## 2020-06-19 DIAGNOSIS — M19041 Primary osteoarthritis, right hand: Secondary | ICD-10-CM | POA: Diagnosis present

## 2020-06-19 DIAGNOSIS — I9589 Other hypotension: Secondary | ICD-10-CM | POA: Diagnosis present

## 2020-06-19 DIAGNOSIS — Z7401 Bed confinement status: Secondary | ICD-10-CM

## 2020-06-19 DIAGNOSIS — D638 Anemia in other chronic diseases classified elsewhere: Secondary | ICD-10-CM | POA: Diagnosis present

## 2020-06-19 DIAGNOSIS — L89153 Pressure ulcer of sacral region, stage 3: Secondary | ICD-10-CM | POA: Diagnosis present

## 2020-06-19 DIAGNOSIS — G8250 Quadriplegia, unspecified: Secondary | ICD-10-CM | POA: Diagnosis present

## 2020-06-19 DIAGNOSIS — Z87440 Personal history of urinary (tract) infections: Secondary | ICD-10-CM

## 2020-06-19 DIAGNOSIS — N400 Enlarged prostate without lower urinary tract symptoms: Secondary | ICD-10-CM | POA: Diagnosis present

## 2020-06-19 DIAGNOSIS — E43 Unspecified severe protein-calorie malnutrition: Secondary | ICD-10-CM | POA: Diagnosis present

## 2020-06-19 DIAGNOSIS — R338 Other retention of urine: Secondary | ICD-10-CM | POA: Diagnosis present

## 2020-06-19 DIAGNOSIS — M19042 Primary osteoarthritis, left hand: Secondary | ICD-10-CM | POA: Diagnosis present

## 2020-06-19 DIAGNOSIS — Z880 Allergy status to penicillin: Secondary | ICD-10-CM

## 2020-06-19 DIAGNOSIS — Z7189 Other specified counseling: Secondary | ICD-10-CM | POA: Diagnosis not present

## 2020-06-19 DIAGNOSIS — M4712 Other spondylosis with myelopathy, cervical region: Secondary | ICD-10-CM | POA: Diagnosis present

## 2020-06-19 DIAGNOSIS — L89150 Pressure ulcer of sacral region, unstageable: Secondary | ICD-10-CM | POA: Diagnosis not present

## 2020-06-19 DIAGNOSIS — T83511A Infection and inflammatory reaction due to indwelling urethral catheter, initial encounter: Secondary | ICD-10-CM | POA: Diagnosis present

## 2020-06-19 DIAGNOSIS — R627 Adult failure to thrive: Secondary | ICD-10-CM | POA: Diagnosis present

## 2020-06-19 DIAGNOSIS — Z7982 Long term (current) use of aspirin: Secondary | ICD-10-CM

## 2020-06-19 DIAGNOSIS — Z888 Allergy status to other drugs, medicaments and biological substances status: Secondary | ICD-10-CM

## 2020-06-19 DIAGNOSIS — Z833 Family history of diabetes mellitus: Secondary | ICD-10-CM

## 2020-06-19 DIAGNOSIS — Z79899 Other long term (current) drug therapy: Secondary | ICD-10-CM

## 2020-06-19 DIAGNOSIS — E46 Unspecified protein-calorie malnutrition: Secondary | ICD-10-CM | POA: Diagnosis present

## 2020-06-19 DIAGNOSIS — Z9181 History of falling: Secondary | ICD-10-CM

## 2020-06-19 DIAGNOSIS — L89311 Pressure ulcer of right buttock, stage 1: Secondary | ICD-10-CM | POA: Diagnosis present

## 2020-06-19 LAB — COMPREHENSIVE METABOLIC PANEL
ALT: 13 U/L (ref 0–44)
AST: 22 U/L (ref 15–41)
Albumin: 2 g/dL — ABNORMAL LOW (ref 3.5–5.0)
Alkaline Phosphatase: 86 U/L (ref 38–126)
Anion gap: 9 (ref 5–15)
BUN: 13 mg/dL (ref 8–23)
CO2: 30 mmol/L (ref 22–32)
Calcium: 8.3 mg/dL — ABNORMAL LOW (ref 8.9–10.3)
Chloride: 102 mmol/L (ref 98–111)
Creatinine, Ser: 0.57 mg/dL — ABNORMAL LOW (ref 0.61–1.24)
GFR, Estimated: >60 mL/min (ref >60)
Glucose, Bld: 115 mg/dL — ABNORMAL HIGH (ref 70–99)
Potassium: 3.3 mmol/L — ABNORMAL LOW (ref 3.5–5.1)
Sodium: 141 mmol/L (ref 135–145)
Total Bilirubin: 0.9 mg/dL (ref 0.3–1.2)
Total Protein: 6.2 g/dL — ABNORMAL LOW (ref 6.5–8.1)

## 2020-06-19 LAB — CBC WITH DIFFERENTIAL/PLATELET
Abs Immature Granulocytes: 0.2 10*3/uL — ABNORMAL HIGH (ref 0.00–0.07)
Basophils Absolute: 0.1 10*3/uL (ref 0.0–0.1)
Basophils Relative: 1 %
Eosinophils Absolute: 0 10*3/uL (ref 0.0–0.5)
Eosinophils Relative: 0 %
HCT: 29.3 % — ABNORMAL LOW (ref 39.0–52.0)
Hemoglobin: 9 g/dL — ABNORMAL LOW (ref 13.0–17.0)
Immature Granulocytes: 2 %
Lymphocytes Relative: 12 %
Lymphs Abs: 1.5 10*3/uL (ref 0.7–4.0)
MCH: 29.8 pg (ref 26.0–34.0)
MCHC: 30.7 g/dL (ref 30.0–36.0)
MCV: 97 fL (ref 80.0–100.0)
Monocytes Absolute: 1.2 10*3/uL — ABNORMAL HIGH (ref 0.1–1.0)
Monocytes Relative: 10 %
Neutro Abs: 9.5 10*3/uL — ABNORMAL HIGH (ref 1.7–7.7)
Neutrophils Relative %: 75 %
Platelets: 237 10*3/uL (ref 150–400)
RBC: 3.02 MIL/uL — ABNORMAL LOW (ref 4.22–5.81)
RDW: 15.6 % — ABNORMAL HIGH (ref 11.5–15.5)
WBC: 12.5 10*3/uL — ABNORMAL HIGH (ref 4.0–10.5)
nRBC: 0 % (ref 0.0–0.2)

## 2020-06-19 LAB — URINALYSIS, ROUTINE W REFLEX MICROSCOPIC

## 2020-06-19 LAB — URINALYSIS, MICROSCOPIC (REFLEX)
RBC / HPF: >50 RBC/hpf (ref 0–5)
Squamous Epithelial / HPF: NONE SEEN (ref 0–5)
WBC, UA: >50 WBC/hpf (ref 0–5)

## 2020-06-19 LAB — PROTIME-INR
INR: 1.3 — ABNORMAL HIGH (ref 0.8–1.2)
Prothrombin Time: 15.9 seconds — ABNORMAL HIGH (ref 11.4–15.2)

## 2020-06-19 IMAGING — DX DG CHEST 1V PORT
1 series · 1 of 1 positions shown · non-contrast
Comparison: [DATE] and prior.

CLINICAL DATA: cough

EXAM:
PORTABLE CHEST 1 VIEW

[chest ap]
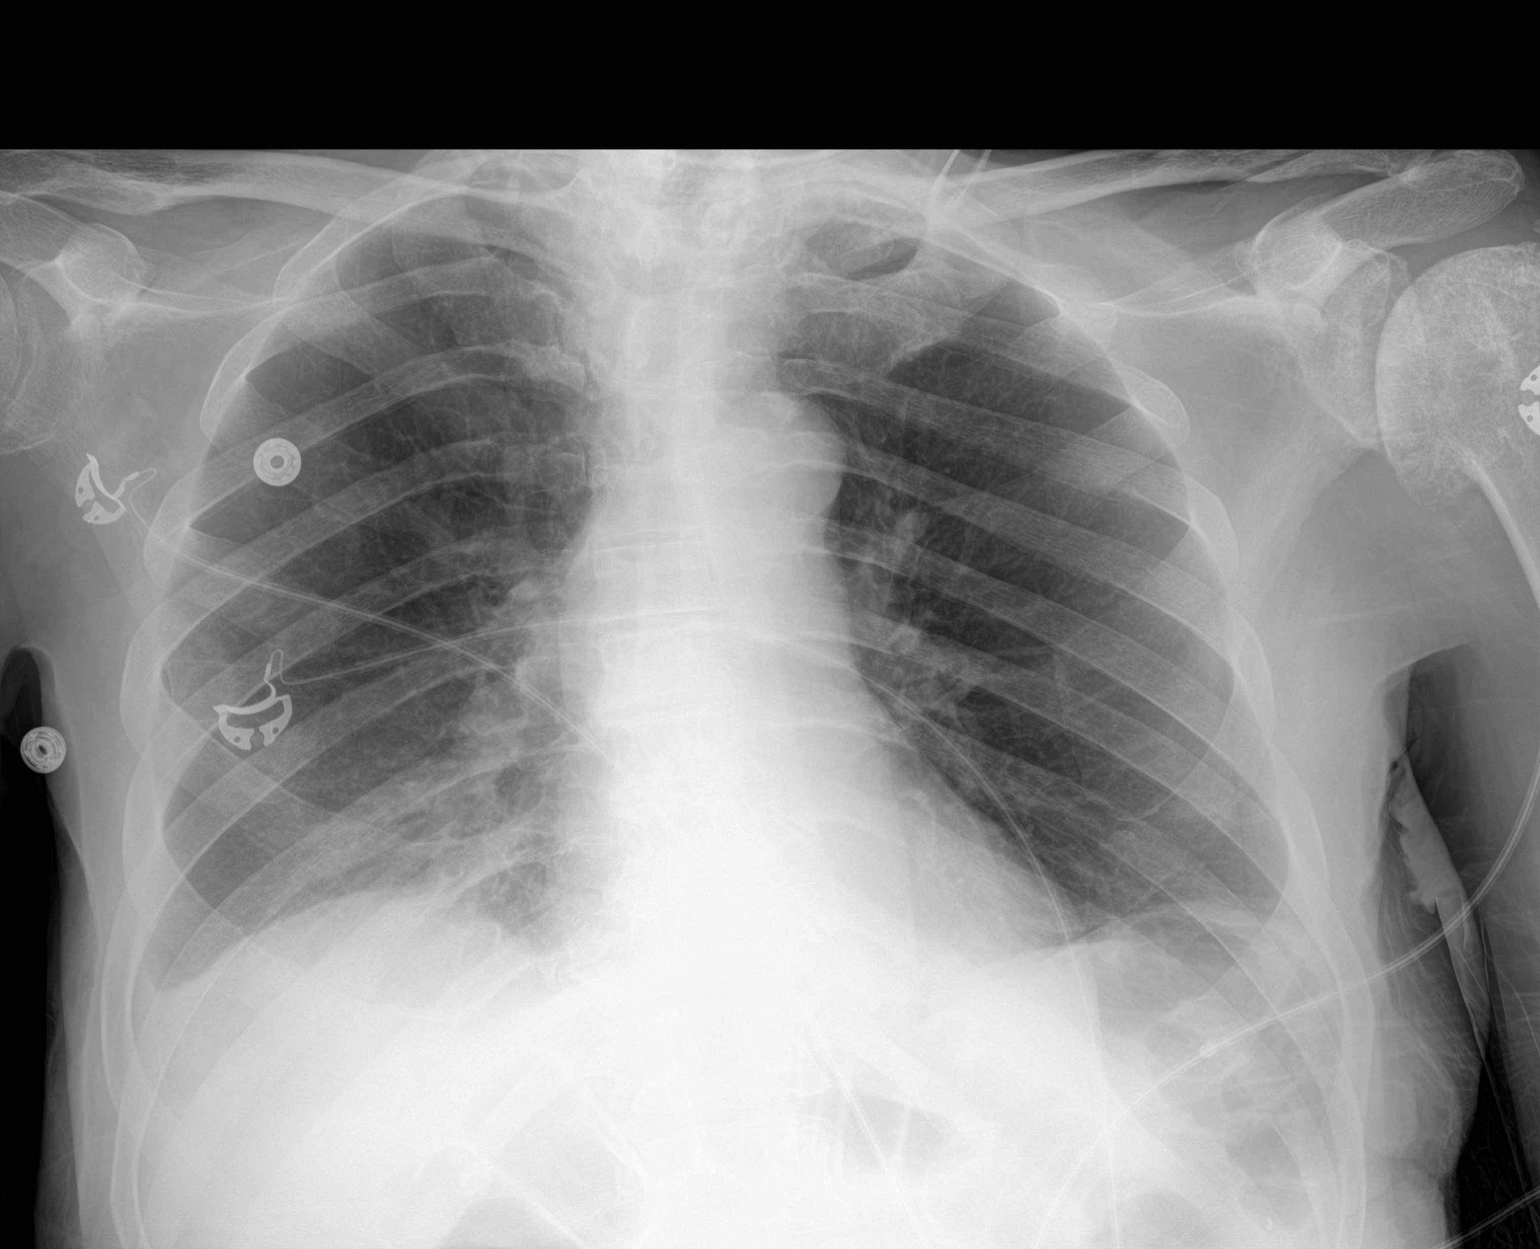

[1 of 1 positions shown; findings below may reference images not displayed]

FINDINGS: Decreased bibasilar opacities and small pleural effusions. No
pneumothorax. Cardiomediastinal silhouette within normal limits.
Left shoulder osteoarthrosis.
IMPRESSION: Decreased bibasilar opacities and small pleural effusions.

## 2020-06-19 IMAGING — CT CT ABD-PELV W/O CM
2 of 4 series · 15 of 46 positions shown, 17 images · non-contrast
Comparison: Chest CT dated [DATE].

CLINICAL DATA: Abdominal pain, hematuria since [REDACTED]. Suprapubic
catheter in place. RIGHT lower quadrant abdominal pain. COVID
positive.

EXAM:
CT ABDOMEN AND PELVIS WITHOUT CONTRAST
TECHNIQUE: Multidetector CT imaging of the abdomen and pelvis was performed
following the standard protocol without IV contrast.

[Series 4: axial st · axial · 0.83mm/px · z∈[-435,+35]mm · 12 of 108 slices shown, 14 images]
[im 7/108  soft-tissue]
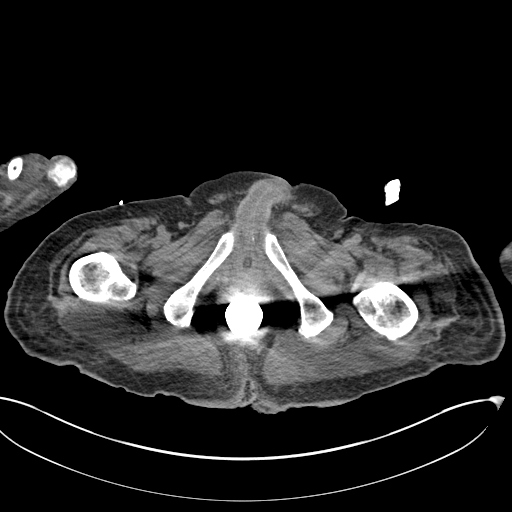
[im 7/108  bone]
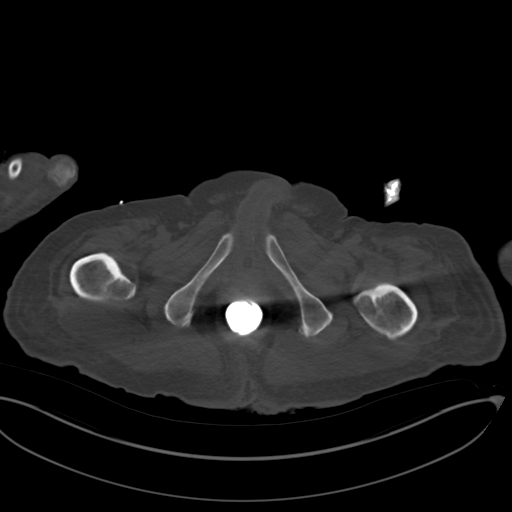
[im 14/108  soft-tissue]
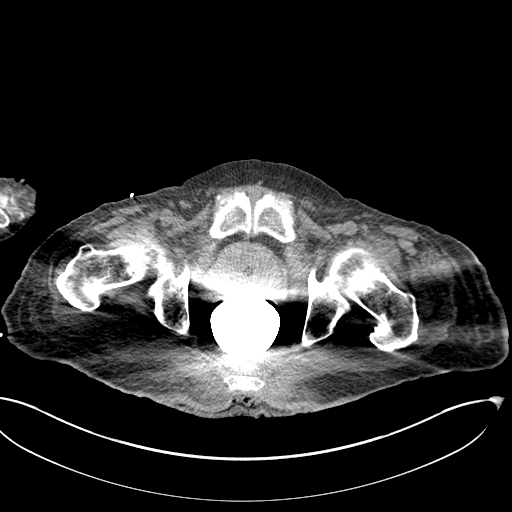
[im 27/108  soft-tissue]
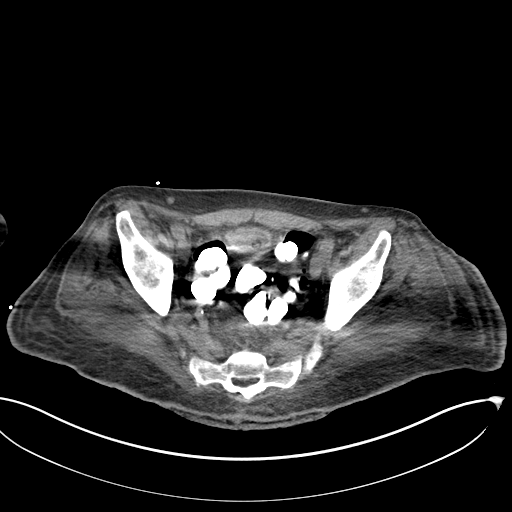
[im 34/108  soft-tissue]
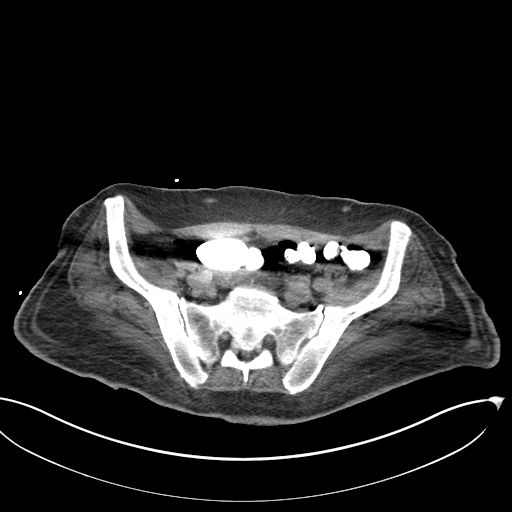
[im 41/108  soft-tissue]
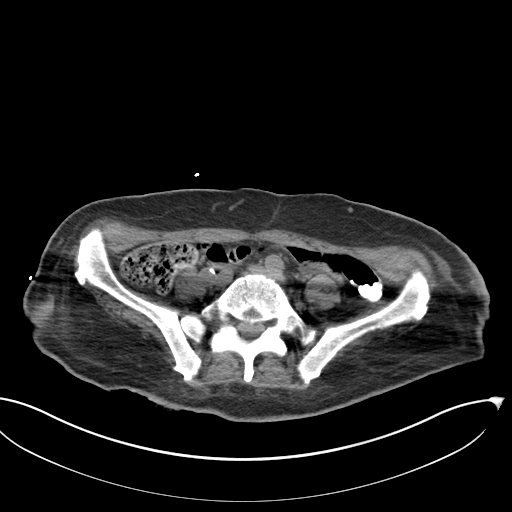
[im 47/108  soft-tissue]
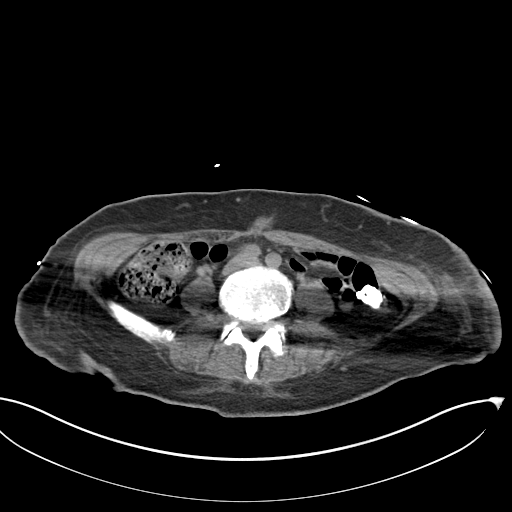
[im 61/108  soft-tissue]
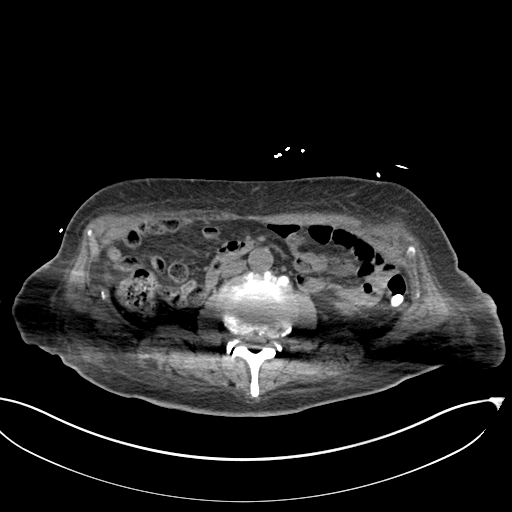
[im 67/108  soft-tissue]
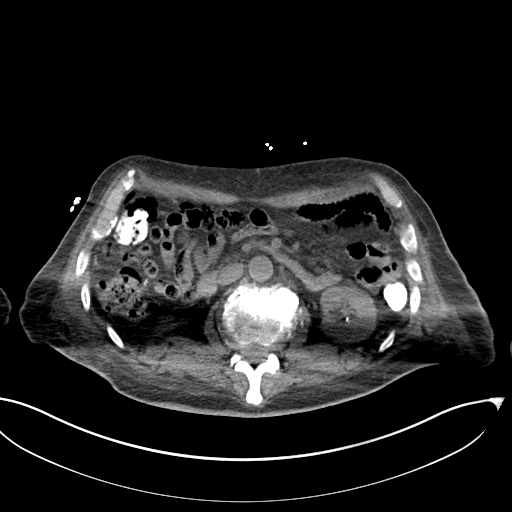
[im 74/108  soft-tissue]
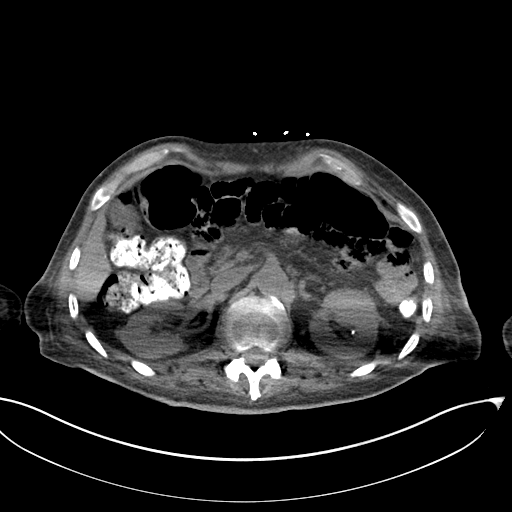
[im 74/108  bone]
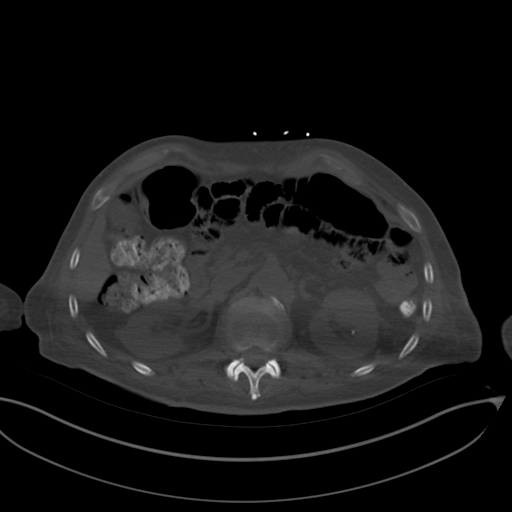
[im 81/108  soft-tissue]
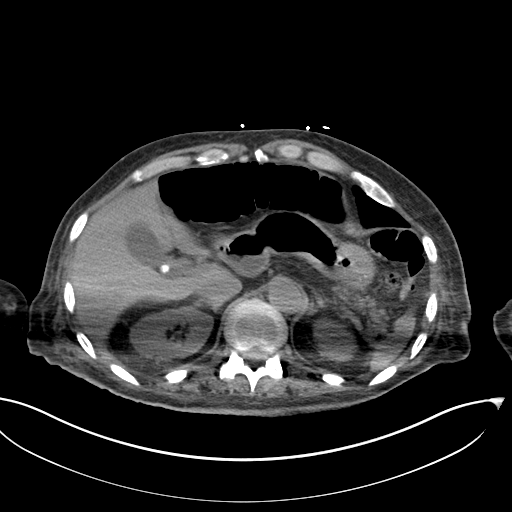
[im 94/108  soft-tissue]
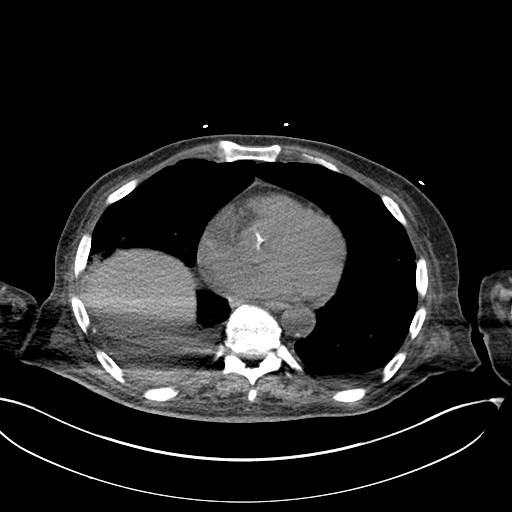
[im 101/108  soft-tissue]
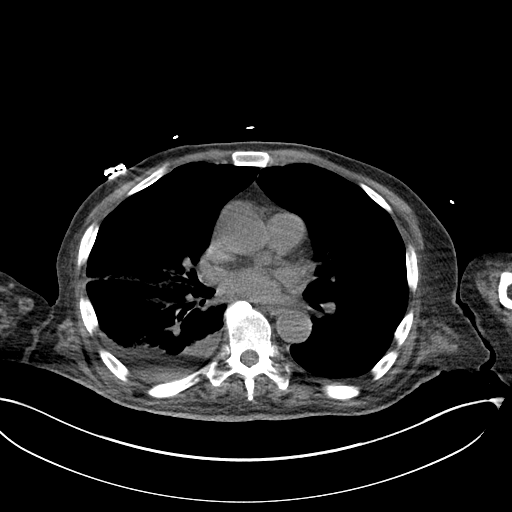

[Series 7: coronal st · coronal · 0.93mm/px · 3 of 131 slices shown]
[im 44/131  soft-tissue]
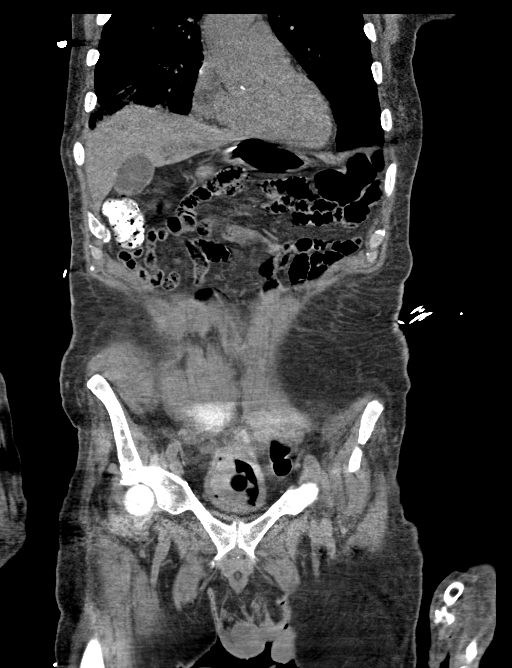
[im 58/131  soft-tissue]
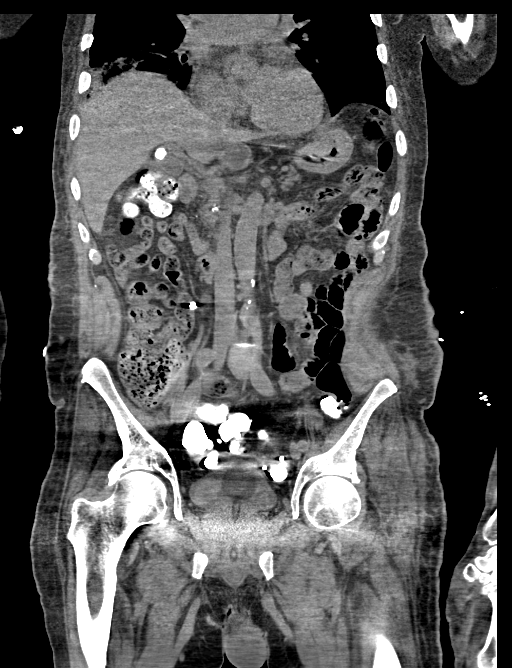
[im 73/131  soft-tissue]
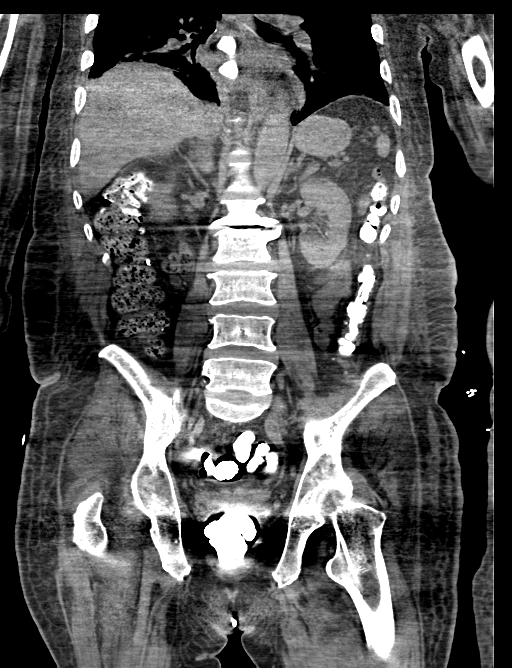

[15 of 46 positions shown; findings below may reference images not displayed]

FINDINGS: Lower chest: Patchy small consolidations at the bilateral lung
bases, RIGHT greater than LEFT. RIGHT pleural effusion, small to
moderate in size.

Hepatobiliary: No focal liver abnormality is seen. 1.1 cm gallstone.
Gallbladder appears otherwise unremarkable. No bile duct dilatation
is seen.

Pancreas: Diffusely infiltrated with fat but otherwise unremarkable.
No peripancreatic fluid.

Spleen: Normal in size without focal abnormality.

Adrenals/Urinary Tract: Adrenal glands are unremarkable. 2 LEFT
renal stones, each measuring 2-3 mm, nonobstructive. Single 3 mm
nonobstructing RIGHT renal stone. No hydronephrosis is seen
bilaterally. No ureteral or bladder calculi are identified.

Bladder is decompressed by Foley catheter. Contents of the bladder
are hyperdense, presumably blood products.

Stomach/Bowel: No dilated large or small bowel loops. Diverticulosis
of the sigmoid and descending colon but no focal inflammatory
changes seen to suggest acute diverticulitis. Moderate amount of
stool and gas in the ascending and transverse colon. Stomach is
unremarkable, partially decompressed.

Vascular/Lymphatic: Aortic atherosclerosis. No enlarged lymph nodes
are seen within the abdomen or pelvis.

Reproductive: Prostate is unremarkable.

Other: No free fluid or abscess collection is seen. No free
intraperitoneal air.

Musculoskeletal: No acute or suspicious osseous finding.
Degenerative spondylosis at the L1-2 and L2-3 levels, moderate in
degree with associated disc space narrowing and osseous spurring.
Visualized soft tissues of the abdominal wall are unremarkable.
Gas-like collection overlying the coccyx and lower sacrum.
IMPRESSION: 1. Patchy small consolidations at the bilateral lung bases, RIGHT
greater than LEFT, suggesting [68] pneumonia. RIGHT pleural
effusion, small to moderate in size.
2. Bladder is decompressed by Foley catheter. Contents of the
bladder are hyperdense, presumably blood products.
3. Bilateral nephrolithiasis (largest stone measuring 3 mm). No
ureteral or bladder calculi. No hydronephrosis.
4. Colonic diverticulosis without evidence of acute diverticulitis.
5. Cholelithiasis without evidence of acute cholecystitis.
6. Ill-defined collection of air overlying the coccyx and lower
sacrum, possibly superficial to the patient but concerning for
sequela of sacral decubitus ulcer or other infectious process of the
soft tissues overlying the sacrum/coccyx. Clinical correlation
recommended.

Aortic Atherosclerosis ([68]-[68]).

## 2020-06-19 MED ORDER — FLEET ENEMA 7-19 GM/118ML RE ENEM: 1.0000 | ENEMA | Freq: Once | RECTAL | Status: DC | PRN

## 2020-06-19 MED ORDER — ONDANSETRON HCL 4 MG/2ML IJ SOLN: 4.0000 mg | Freq: Four times a day (QID) | INTRAMUSCULAR | Status: DC | PRN

## 2020-06-19 MED ORDER — SODIUM CHLORIDE 0.9 % IV SOLN
2.0000 g | Freq: Three times a day (TID) | INTRAVENOUS | Status: DC
  Administered 2020-06-20 – 2020-06-21 (×5): 2 g via INTRAVENOUS
  Filled 2020-06-19 (×6): qty 2

## 2020-06-19 MED ORDER — SODIUM CHLORIDE 0.9 % IV SOLN
2.0000 g | Freq: Once | INTRAVENOUS | Status: AC
  Administered 2020-06-19: 2 g via INTRAVENOUS
  Filled 2020-06-19: qty 2

## 2020-06-19 MED ORDER — VANCOMYCIN HCL 1250 MG/250ML IV SOLN
1250.0000 mg | Freq: Two times a day (BID) | INTRAVENOUS | Status: DC
  Administered 2020-06-20 – 2020-06-22 (×5): 1250 mg via INTRAVENOUS
  Filled 2020-06-19 (×6): qty 250

## 2020-06-19 MED ORDER — VANCOMYCIN HCL 2000 MG/400ML IV SOLN
2000.0000 mg | Freq: Once | INTRAVENOUS | Status: AC
  Administered 2020-06-19: 2000 mg via INTRAVENOUS
  Filled 2020-06-19: qty 400

## 2020-06-19 MED ORDER — POTASSIUM CHLORIDE IN NACL 20-0.45 MEQ/L-% IV SOLN
INTRAVENOUS | Status: AC
  Filled 2020-06-19 (×2): qty 1000

## 2020-06-19 MED ORDER — TAMSULOSIN HCL 0.4 MG PO CAPS
0.4000 mg | ORAL_CAPSULE | Freq: Every day | ORAL | Status: DC
  Administered 2020-06-19 – 2020-06-26 (×8): 0.4 mg via ORAL
  Filled 2020-06-19 (×8): qty 1

## 2020-06-19 MED ORDER — ACETAMINOPHEN 650 MG RE SUPP: 650.0000 mg | Freq: Four times a day (QID) | RECTAL | Status: DC | PRN

## 2020-06-19 MED ORDER — POLYETHYLENE GLYCOL 3350 17 G PO PACK
17.0000 g | PACK | Freq: Two times a day (BID) | ORAL | Status: DC
  Administered 2020-06-19 – 2020-06-25 (×8): 17 g via ORAL
  Filled 2020-06-19 (×10): qty 1

## 2020-06-19 MED ORDER — PANTOPRAZOLE SODIUM 40 MG PO TBEC
40.0000 mg | DELAYED_RELEASE_TABLET | Freq: Every day | ORAL | Status: DC
  Administered 2020-06-19 – 2020-06-25 (×7): 40 mg via ORAL
  Filled 2020-06-19 (×7): qty 1

## 2020-06-19 MED ORDER — FERROUS SULFATE 325 (65 FE) MG PO TABS
325.0000 mg | ORAL_TABLET | Freq: Two times a day (BID) | ORAL | Status: DC
  Administered 2020-06-19 – 2020-06-26 (×13): 325 mg via ORAL
  Filled 2020-06-19 (×13): qty 1

## 2020-06-19 MED ORDER — ACETAMINOPHEN 325 MG PO TABS: 650.0000 mg | ORAL_TABLET | Freq: Four times a day (QID) | ORAL | Status: DC | PRN

## 2020-06-19 MED ORDER — DOCUSATE SODIUM 100 MG PO CAPS
100.0000 mg | ORAL_CAPSULE | Freq: Two times a day (BID) | ORAL | Status: DC
  Administered 2020-06-19 – 2020-06-25 (×11): 100 mg via ORAL
  Filled 2020-06-19 (×12): qty 1

## 2020-06-19 MED ORDER — METRONIDAZOLE IN NACL 5-0.79 MG/ML-% IV SOLN
500.0000 mg | Freq: Once | INTRAVENOUS | Status: AC
  Administered 2020-06-19: 500 mg via INTRAVENOUS
  Filled 2020-06-19: qty 100

## 2020-06-19 MED ORDER — ONDANSETRON HCL 4 MG PO TABS: 4.0000 mg | ORAL_TABLET | Freq: Four times a day (QID) | ORAL | Status: DC | PRN

## 2020-06-19 MED ORDER — METRONIDAZOLE IN NACL 5-0.79 MG/ML-% IV SOLN
500.0000 mg | Freq: Three times a day (TID) | INTRAVENOUS | Status: DC
  Administered 2020-06-20 – 2020-06-21 (×5): 500 mg via INTRAVENOUS
  Filled 2020-06-19 (×6): qty 100

## 2020-06-19 MED ORDER — SODIUM CHLORIDE 0.9 % IV BOLUS
500.0000 mL | Freq: Once | INTRAVENOUS | Status: AC
  Administered 2020-06-19: 500 mL via INTRAVENOUS

## 2020-06-19 MED ORDER — MIDODRINE HCL 5 MG PO TABS
10.0000 mg | ORAL_TABLET | Freq: Two times a day (BID) | ORAL | Status: DC
  Administered 2020-06-20 – 2020-06-26 (×12): 10 mg via ORAL
  Filled 2020-06-19 (×15): qty 2

## 2020-06-19 NOTE — ED Notes (Signed)
Patient transported to CT 

## 2020-06-19 NOTE — Progress Notes (Signed)
Pharmacy Antibiotic Note  Shannon [name] is a 74 y.o. male admitted on 06/19/2020 with infected sacral decubitis ulcer.  Pharmacy has been consulted for vancomycin and cefepime dosing.  Plan: Vancomycin 2g IV x 1 in ED, then 1250mg  IV q12h for estimated AUC 418 rounding SCr up 0.8 given quadriplegia may overestimate renal function Check levels as needed Cefepime 2g IV q8h Follow up renal function & cultures  Height: 6' (182.9 cm) Weight: 106 kg (233 lb 11 oz) IBW/kg (Calculated) : 77.6  Temp (24hrs), Avg:98.4 F (36.9 C), Min:98.4 F (36.9 C), Max:98.4 F (36.9 C)  Recent Labs  Lab 06/19/20 1312  WBC 12.5*  CREATININE 0.57*    Estimated Creatinine Clearance: 102 mL/min (A) (by C-G formula based on SCr of 0.57 mg/dL (L)).    Allergies  Allergen Reactions  . Iodine Swelling  . Penicillins Swelling    ** tolerates cephalosporins Facial swelling, itchy throat    Antimicrobials this admission: 12/27 Cefepime >> 12/27 Vancomycin >>  Dose adjustments this admission:  Microbiology results: 12/27 BCx: 12/27 UCx:  Thank you for allowing pharmacy to be a part of this patient's care.  1/28, PharmD, BCPS Pharmacy: [phone] 06/19/2020 6:04 PM

## 2020-06-19 NOTE — ED Triage Notes (Signed)
Pt BIB EMS and presents with hematuria since Thursday. Pt has a suprapubic catheter.  Pt only reports lower quadrant abdominal pain.  Pt a/o x 4.  Pt also is COVID+ but doesn't have breathing issues other than a cough. Pt is quadriplegic.  Pt coming from [facility] [employer] [place])  EMS VS  BP: 160/78 HR; 108 O2: 97% RA 98.2 temporal temp F

## 2020-06-19 NOTE — ED Notes (Signed)
Pt tolerated sips of water with PO meds

## 2020-06-19 NOTE — ED Notes (Signed)
Dr. Rees at bedside  

## 2020-06-19 NOTE — ED Provider Notes (Signed)
Received the patient in signout from Dr. Madilyn Hook, please see her note for initial H&P.  Briefly the patient is a 74 year old male unfortunately has had a precipitous decline.  Patient was recently was admitted to the hospital incidently found to have coronavirus. Now having some symptoms of covid for about 48 hours, cough, congestion.   Was discharged to a skilled nursing facility.  Was sent here today for gross hematuria.  UA with likely infection, TNT bacteria >50wbc.  CT abd pelvis with without ureteral calculi.  Concern for oseo about the sacrum. Exam with palpable sacrum, fist sized defect. Skin surrounding non blanching.  Foul smelling discharge.  Increased WBC, start on broad spectrum abx.  Discussed with family , will admit.    Melene Plan, DO 06/19/20 1652

## 2020-06-19 NOTE — ED Provider Notes (Signed)
[facility]-EMERGENCY DEPT Provider Note   CSN: [id number] Arrival date & time: 06/19/20  1100     History Chief Complaint  Patient presents with  . Hematuria    Shannon [name] is a 74 y.o. male.  The history is provided by the patient, the EMS personnel and medical records.  Hematuria   Shannon [name] is a 74 y.o. male who presents to the Emergency Department complaining of hematuria. He presents the emergency department by EMS from [facility] for evaluation of hematuria from a catheter. He is currently residing there after discharge from the hospital on December 20. He has a history of quadriplegia, epidural hematoma status post evacuation, UTI in septic shock. He is unsure how much blood has been present or how long it is been present. He states that overall he feels well. He does have discomfort at the catheter site and is unable to state how long this is been present. Level V caveat due to confusion.  Additional history available from patient's Niece after his initial ED presentation. She states that he is been at [facility] for the last week. She reports that he is experienced blood he urine from his Foley catheter the entire time but is has significantly worsened over the last few days. She also reports progressive cough over the last few days.    Past Medical History:  Diagnosis Date  . Asthma   . 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 Newell Coral, 1992-->surg at [facility].  Summer 2021->Cervicalgia,arm pain, hand atrophy RUE wkness, hyperreflex (Dr. 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 Hay fever   . Hypertension   . Osteoarthritis of both hands     Patient Active Problem List   Diagnosis Date Noted  . Palliative care by specialist   . Goals of care, counseling/discussion   . Respiration  abnormal   . Respiratory distress   . Protein-calorie malnutrition, severe 05/05/2020  . Pressure injury of skin 05/04/2020  . Pneumonia 05/03/2020  . Sepsis secondary to UTI (HCC) 05/03/2020  . Urinary retention 05/03/2020  . Chronic indwelling Foley catheter 05/03/2020  . 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 Myelopathy (HCC) 04/22/2020  . Quadriplegia and quadriparesis (HCC)   . Acute blood loss anemia   . Slow transit constipation   . Essential hypertension   . Cervical myelopathy (HCC) 04/07/2020  . Quadriparesis (HCC)   . Benign essential HTN   . Post-operative pain   . Neuropathic pain   . Asthma   . 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 ANTERIOR CERVICAL DECOMP/DISCECTOMY FUSION N/A 04/05/2020   Procedure: ANTERIOR CERVICAL DECOMPRESSION/DISCECTOMY FUSION, INTERBODY PROSTHESIS, PLATE/SCREWS CERVICAL THREE-CERVICAL FOUR, CERVICAL FOUR- CERVICAL FIVE;  Surgeon: Tressie Stalker, MD;  Location: [facility] OR;  Service: Neurosurgery;  Laterality: N/A;  . ANTERIOR CERVICAL DECOMP/DISCECTOMY FUSION N/A 04/22/2020   Procedure: Reexploration of anterior cervical wound for epidural hematoma;  Surgeon: Donalee Citrin, MD;  Location: [facility] OR;  Service: Neurosurgery;  Laterality: N/A;  . BACK SURGERY    . INCISION AND DRAINAGE Left 2012   L hand infection  . ROTATOR CUFF REPAIR Right    x 3  . SPINE SURGERY         Family History  Problem Relation Age of Onset  .  Heart attack Mother   . Heart disease Mother   . High blood pressure Mother   . Alcohol abuse Father   . Cancer Father   . Diabetes Father   . High blood pressure Father     Social History   Tobacco Use  . Smoking status: Former Games developer  . Smokeless tobacco: Never Used  Vaping Use  . Vaping Use: Never used  Substance Use Topics  . Alcohol use: Not Currently  . Drug use: Never    Home Medications Prior to Admission  medications   Medication Sig Start Date End Date Taking? Authorizing Provider  acetaminophen (TYLENOL) 500 MG tablet Take 1,000 mg by mouth 3 (three) times daily.     [provider]  aspirin 325 MG tablet Take 325 mg by mouth every morning.    [provider]  docusate sodium (COLACE) 100 MG capsule Take 1 capsule (100 mg total) by mouth 2 (two) times daily. 06/12/20   Leroy Sea, MD  feeding supplement (ENSURE ENLIVE / ENSURE PLUS) LIQD Take 237 mLs by mouth 4 (four) times daily. 06/12/20   Leroy Sea, MD  ferrous sulfate 325 (65 FE) MG tablet Take 1 tablet (325 mg total) by mouth 2 (two) times daily with a meal. 06/06/20   Zannie Cove, MD  midodrine (PROAMATINE) 10 MG tablet Take 1 tablet (10 mg total) by mouth 2 (two) times daily with a meal. 06/06/20   Zannie Cove, MD  oxyCODONE (OXY IR/ROXICODONE) 5 MG immediate release tablet Take 1 tablet (5 mg total) by mouth every 8 (eight) hours as needed for moderate pain or severe pain. 06/06/20   Zannie Cove, MD  pantoprazole (PROTONIX) 40 MG tablet Take 1 tablet (40 mg total) by mouth at bedtime. 04/19/20   Love, [name], PA-C  polyethylene glycol (MIRALAX / GLYCOLAX) 17 g packet Take 17 g by mouth every morning. Mix in 6 oz water and drink    [provider]  tamsulosin (FLOMAX) 0.4 MG CAPS capsule Take 1 capsule (0.4 mg total) by mouth daily. 04/28/20   Tressie Stalker, MD    Allergies    Iodine and Penicillins  Review of Systems   Review of Systems  Genitourinary: Positive for hematuria.  All other systems reviewed and are negative.   Physical Exam Updated Vital Signs BP 126/75 (BP Location: Left Arm)   Pulse (!) 103   Temp 98.4 F (36.9 C) (Oral)   Resp 18   Ht 6' (1.829 m)   Wt 106 kg   SpO2 94%   BMI 31.69 kg/m   Physical Exam Vitals and nursing note reviewed.  Constitutional:      Appearance: He is well-developed and well-nourished.     Comments: Chronically ill  appearing  HENT:     Head: Normocephalic and atraumatic.  Neck:     Comments: Cervical collar in place. Cardiovascular:     Rate and Rhythm: Regular rhythm. Tachycardia present.     Heart sounds: No murmur heard.   Pulmonary:     Effort: Pulmonary effort is normal. No respiratory distress.     Breath sounds: Normal breath sounds.  Abdominal:     Palpations: Abdomen is soft.     Tenderness: There is no guarding or rebound.     Comments: Mild suprapubic tenderness  Genitourinary:    Comments: 22 French Foley catheter in place. Catheter bag has dark blood he urine in it, no clots. Musculoskeletal:  General: No tenderness or edema.  Skin:    General: Skin is warm and dry.  Neurological:     Mental Status: He is alert and oriented to person, place, and time.  Psychiatric:        Mood and Affect: Mood and affect normal.        Behavior: Behavior normal.     ED Results / Procedures / Treatments   Labs (all labs ordered are listed, but only abnormal results are displayed) Labs Reviewed  URINE CULTURE  BASIC METABOLIC PANEL  CBC WITH DIFFERENTIAL/PLATELET  URINALYSIS, ROUTINE W REFLEX MICROSCOPIC    EKG None  Radiology No results found.  Procedures Procedures (including critical care time)  Medications Ordered in ED Medications - No data to display  ED Course  I have reviewed the triage vital signs and the nursing notes.  Pertinent labs & imaging results that were available during my care of the patient were reviewed by me and considered in my medical decision making (see chart for details).    MDM Rules/Calculators/A&P                         patient here for evaluation of hematuria. He also has a cough, recently diagnosed with COVID-19 on December 14. He was treated with monoclonal antibodies while he was in the hospital. He is chronically ill appearing on evaluation, has mild superpubic tenderness with gross blood in his Foley catheter. The catheter was  exchanged by nursing and irrigated with 750 mL. There were a few very small clots that were returned. Urine quickly turned from gross blood to blood-tinged/light pink. Will send urinalysis and culture off of new foley. Patient care transferred pending urinalysis, CT scan for hematuria.  Final Clinical Impression(s) / ED Diagnoses Final diagnoses:  None    Rx / DC Orders ED Discharge Orders    None       Tilden Fossa, MD 06/19/20 1540

## 2020-06-19 NOTE — ED Notes (Signed)
Foley catheter replaced with a 22 french three way.  Approximately NaCl irrigated into catheter and urine went from a dark red to a light pink.  Pt tolerated irrigation well.  Dr. Madilyn Hook made aware.

## 2020-06-19 NOTE — ED Notes (Signed)
RN made aware of vitals  

## 2020-06-19 NOTE — Progress Notes (Signed)
Pharmacy Note   A consult was received from an ED physician for cefepime per pharmacy dosing.    The patient's profile has been reviewed for ht/wt/allergies/indication/available labs.    A one time order has been placed for cefepime 2 gr IV x1 .  Pt has a listed allergy to PCN, but has tolerated multiple cephalosporins in the past.     Further antibiotics/pharmacy consults should be ordered by admitting physician if indicated.                       Thank you,  Adalberto Cole, PharmD, BCPS 06/19/2020 4:49 PM

## 2020-06-19 NOTE — H&P (Signed)
[facility] History and Physical  Nehal Shives UTM:[id number] DOB: Oct 27, 1945 DOA: 06/19/2020   PCP: [name], MD  Specialists: Seen by Dr. Lovell Sheehan with neurosurgery.  Chief Complaint: Blood in the urine  HPI: Shannon [name] is a 74 y.o. 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  Subsequently sustained a fall resulting in epidural hematoma, underwent reexploration and removal of hardware and evacuation of hematoma on October 30.  Has been on a cervical collar since then and with plans to keep it on until mid January.  Patient also with history of BPH, chronic indwelling Foley catheter, hematuria.  He had a prolonged hospital stay last month and was actually discharged just a week ago to short-term rehab at a skilled nursing facility [facility]).  During his previous admission he had septic shock due to UTI and was also noted to have aspiration pneumonia.  He was also found to be incidentally positive for COVID-19 at that time and received monoclonal antibody treatment.  He was sent from the skilled nursing facility to the ER initially on December 24 for problems with his Foley catheter.  The Foley was replaced at that time and he was sent back to the skilled nursing facility.  He was sent back today to the ER due to hematuria.  Patient was also complaining of lower abdominal pain.  He was also noted to have a cough.  Patient is a poor historian.  His niece is at the bedside.  Denies any fever or chills.  Currently denies any pain or abdominal discomfort.  There was also concern for worsening sacral wound infection.  Patient was evaluated in the ED.  He was found to have hematuria in his Foley catheter.  CT scan of the abdomen pelvis showed nonobstructing renal stones.  No concerning findings noted in the bladder.  However patient was noted to have ill-defined collection of air overlying the  coccyx in the lower sacrum.  He was given broad-spectrum antibiotics.  He will be hospitalized for further management.   Home Medications: Prior to Admission medications   Medication Sig Start Date End Date Taking? Authorizing Provider  acetaminophen (TYLENOL) 500 MG tablet Take 1,000 mg by mouth 3 (three) times daily.     [provider]  aspirin 325 MG tablet Take 325 mg by mouth every morning.    [provider]  docusate sodium (COLACE) 100 MG capsule Take 1 capsule (100 mg total) by mouth 2 (two) times daily. 06/12/20   Leroy Sea, MD  feeding supplement (ENSURE ENLIVE / ENSURE PLUS) LIQD Take 237 mLs by mouth 4 (four) times daily. 06/12/20   Leroy Sea, MD  ferrous sulfate 325 (65 FE) MG tablet Take 1 tablet (325 mg total) by mouth 2 (two) times daily with a meal. 06/06/20   Zannie Cove, MD  midodrine (PROAMATINE) 10 MG tablet Take 1 tablet (10 mg total) by mouth 2 (two) times daily with a meal. 06/06/20   Zannie Cove, MD  oxyCODONE (OXY IR/ROXICODONE) 5 MG immediate release tablet Take 1 tablet (5 mg total) by mouth every 8 (eight) hours as needed for moderate pain or severe pain. 06/06/20   Zannie Cove, MD  pantoprazole (PROTONIX) 40 MG tablet Take 1 tablet (40 mg total) by mouth at bedtime. 04/19/20   Love, [name], PA-C  polyethylene glycol (MIRALAX / GLYCOLAX) 17 g packet Take 17 g by mouth every morning.  Mix in 6 oz water and drink    [provider]  tamsulosin (FLOMAX) 0.4 MG CAPS capsule Take 1 capsule (0.4 mg total) by mouth daily. 04/28/20   Tressie Stalker, MD    Allergies:  Allergies  Allergen Reactions  . Iodine Swelling  . Penicillins Swelling    ** tolerates cephalosporins Facial swelling, itchy throat    Past Medical History: Past Medical History:  Diagnosis Date  . Asthma   . 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 Newell Coral,  1992-->surg at [facility].  Summer 2021->Cervicalgia,arm pain, hand atrophy RUE wkness, hyperreflex (Dr. 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 Hay fever   . Hypertension   . Osteoarthritis of both hands     Past Surgical History:  Procedure Laterality Date  . ANTERIOR CERVICAL DECOMP/DISCECTOMY FUSION N/A 04/05/2020   Procedure: ANTERIOR CERVICAL DECOMPRESSION/DISCECTOMY FUSION, INTERBODY PROSTHESIS, PLATE/SCREWS CERVICAL THREE-CERVICAL FOUR, CERVICAL FOUR- CERVICAL FIVE;  Surgeon: Tressie Stalker, MD;  Location: [facility] OR;  Service: Neurosurgery;  Laterality: N/A;  . ANTERIOR CERVICAL DECOMP/DISCECTOMY FUSION N/A 04/22/2020   Procedure: Reexploration of anterior cervical wound for epidural hematoma;  Surgeon: Donalee Citrin, MD;  Location: [facility] OR;  Service: Neurosurgery;  Laterality: N/A;  . BACK SURGERY    . INCISION AND DRAINAGE Left 2012   L hand infection  . ROTATOR CUFF REPAIR Right    x 3  . SPINE SURGERY      Social History: Patient currently resides in a skilled nursing facility.  No current smoking or alcohol use.   Family History:  Family History  Problem Relation Age of Onset  . Heart attack Mother   . Heart disease Mother   . High blood pressure Mother   . Alcohol abuse Father   . Cancer Father   . Diabetes Father   . High blood pressure Father      Review of Systems -unable to do as he is somewhat lethargic and refuses to answer questions at times.  Physical Examination  Vitals:   06/19/20 1300 06/19/20 1420 06/19/20 1537 06/19/20 1700  BP: 131/71 120/78 122/67 127/72  Pulse: (!) 106 (!) 109 (!) 101 (!) 102  Resp: (!) 21 (!) 21 (!) 21 19  Temp:      TempSrc:      SpO2: 95% 95% 94% 97%  Weight:      Height:        BP 127/72   Pulse (!) 102   Temp 98.4 F (36.9 C) (Oral)   Resp 19   Ht 6' (1.829 m)   Wt 106 kg   SpO2 97%   BMI 31.69 kg/m   General appearance: alert, cooperative, distracted and  no distress Head: Normocephalic, without obvious abnormality, atraumatic Eyes: conjunctivae/corneas clear. PERRL, EOM's intact.  Neck: no adenopathy, no carotid bruit, no JVD, supple, symmetrical, trachea midline and thyroid not enlarged, symmetric, no tenderness/mass/nodules Resp: Normal effort noted.  Somewhat diminished air entry at the bases but no crackles.  No wheezing or rhonchi appreciated.  Upper airway sounds appreciated. Cardio: regular rate and rhythm, S1, S2 normal, no murmur, click, rub or gallop GI: soft, non-tender; bowel sounds normal; no masses,  no organomegaly Extremities: Mild edema noted bilateral lower extremities.  Pulses: 2+ and symmetric Skin: Skin color, texture, turgor normal. No rashes or lesions Lymph nodes: Cervical, supraclavicular, and axillary nodes normal. Neurologic: Patient is  quadriplegic at baseline.  No new deficits noted.    Labs on Admission: I have personally reviewed following labs and imaging studies  CBC: Recent Labs  Lab 06/19/20 1312  WBC 12.5*  NEUTROABS 9.5*  HGB 9.0*  HCT 29.3*  MCV 97.0  PLT 237   Basic Metabolic Panel: Recent Labs  Lab 06/19/20 1312  NA 141  K 3.3*  CL 102  CO2 30  GLUCOSE 115*  BUN 13  CREATININE 0.57*  CALCIUM 8.3*   GFR: Estimated Creatinine Clearance: 102 mL/min (A) (by C-G formula based on SCr of 0.57 mg/dL (L)). Liver Function Tests: Recent Labs  Lab 06/19/20 1312  AST 22  ALT 13  ALKPHOS 86  BILITOT 0.9  PROT 6.2*  ALBUMIN 2.0*   Coagulation Profile: Recent Labs  Lab 06/19/20 1312  INR 1.3*     Radiological Exams on Admission: CT ABDOMEN PELVIS WO CONTRAST  Result Date: 06/19/2020 CLINICAL DATA:  Abdominal pain, hematuria since Thursday. Suprapubic catheter in place. RIGHT lower quadrant abdominal pain. COVID positive. EXAM: CT ABDOMEN AND PELVIS WITHOUT CONTRAST TECHNIQUE: Multidetector CT imaging of the abdomen and pelvis was performed following the standard protocol  without IV contrast. COMPARISON:  Chest CT dated 05/03/2020. FINDINGS: Lower chest: Patchy small consolidations at the bilateral lung bases, RIGHT greater than LEFT. RIGHT pleural effusion, small to moderate in size. Hepatobiliary: No focal liver abnormality is seen. 1.1 cm gallstone. Gallbladder appears otherwise unremarkable. No bile duct dilatation is seen. Pancreas: Diffusely infiltrated with fat but otherwise unremarkable. No peripancreatic fluid. Spleen: Normal in size without focal abnormality. Adrenals/Urinary Tract: Adrenal glands are unremarkable. 2 LEFT renal stones, each measuring 2-3 mm, nonobstructive. Single 3 mm nonobstructing RIGHT renal stone. No hydronephrosis is seen bilaterally. No ureteral or bladder calculi are identified. Bladder is decompressed by Foley catheter. Contents of the bladder are hyperdense, presumably blood products. Stomach/Bowel: No dilated large or small bowel loops. Diverticulosis of the sigmoid and descending colon but no focal inflammatory changes seen to suggest acute diverticulitis. Moderate amount of stool and gas in the ascending and transverse colon. Stomach is unremarkable, partially decompressed. Vascular/Lymphatic: Aortic atherosclerosis. No enlarged lymph nodes are seen within the abdomen or pelvis. Reproductive: Prostate is unremarkable. Other: No free fluid or abscess collection is seen. No free intraperitoneal air. Musculoskeletal: No acute or suspicious osseous finding. Degenerative spondylosis at the L1-2 and L2-3 levels, moderate in degree with associated disc space narrowing and osseous spurring. Visualized soft tissues of the abdominal wall are unremarkable. Gas-like collection overlying the coccyx and lower sacrum. IMPRESSION: 1. Patchy small consolidations at the bilateral lung bases, RIGHT greater than LEFT, suggesting COVID-19 pneumonia. RIGHT pleural effusion, small to moderate in size. 2. Bladder is decompressed by Foley catheter. Contents of the  bladder are hyperdense, presumably blood products. 3. Bilateral nephrolithiasis (largest stone measuring 3 mm). No ureteral or bladder calculi. No hydronephrosis. 4. Colonic diverticulosis without evidence of acute diverticulitis. 5. Cholelithiasis without evidence of acute cholecystitis. 6. Ill-defined collection of air overlying the coccyx and lower sacrum, possibly superficial to the patient but concerning for sequela of sacral decubitus ulcer or other infectious process of the soft tissues overlying the sacrum/coccyx. Clinical correlation recommended. Aortic Atherosclerosis (ICD10-I70.0). Electronically Signed   By: Bary Richard M.D.   On: 06/19/2020 15:49   DG Chest Port 1 View  Result Date: 06/19/2020 CLINICAL DATA:  cough EXAM: PORTABLE CHEST 1 VIEW COMPARISON:  06/06/2020 and prior. FINDINGS: Decreased bibasilar opacities and small pleural effusions. No pneumothorax. Cardiomediastinal  silhouette within normal limits. Left shoulder osteoarthrosis. IMPRESSION: Decreased bibasilar opacities and small pleural effusions. Electronically Signed   By: Stana Buntinghikanele  Emekauwa M.D.   On: 06/19/2020 14:40      Problem List  Principal Problem:   Hematuria Active Problems:   Cervical myelopathy (HCC)   Chronic indwelling Foley catheter   Protein-calorie malnutrition, severe   Acute lower UTI   Sacral decubitus ulcer   COVID-19 virus infection   UTI (urinary tract infection)   Assessment: This is a 74 year old African-American male with past medical history as stated earlier who is sent from the skilled nursing facility for hematuria and possible infection of his sacral decubitus.    Plan:  1. Hematuria/urinary tract infection in the setting of chronic indwelling Foley catheter: This seems to be a recurrent problem.  He had similar issues back in November when he was hospitalized [facility].  He underwent a bladder irrigation at that time with resolution.  We will place him on antibiotics.   Follow-up on urine culture.  Irrigate the Foley.  No structural lesions identified on CT scan.  If hematuria persists then we will involve urology.  2.  Sacral wound with concern for infection: Seen by general surgery about a month ago when the patient was [facility].  At that time they did not feel that the wound needed debridement.  Patient was seen by wound care nurse and underwent hydrotherapy.  Have discussed again with general surgery today, Dr. Sheliah HatchKinsinger and he will consult on the patient tomorrow morning.  In the meantime continue with antibiotics.  3. COVID-19 infection: Patient was incidentally found to be positive for COVID-19 before he was supposed to go to skilled nursing facility.  His positive result was from December 14.  He was given monoclonal antibodies at that time.  Patient's chest x-ray did actually shows improvement in the bibasilar opacities compared to his last film.  Patient does have a cough however I feel that this is more suggestive of aspiration rather than secondary to COVID-19.  He is not hypoxic.  Does not have increased work of breathing.  We will check a CRP.  At this time do not plan to treat COVID-19 infection.  However since he does have a cough we will keep him on airborne and contact isolation.  4. History of dysphagia and recent aspiration pneumonia: Most likely due to his cervical spine issues.  He has a cervical collar.  Will request speech therapy to see him.  Keep him n.p.o. for now.  5.  Quadriplegia secondary to cervical myelopathy: He underwent C-spine surgery on 10/13.  Subsequently sustained a fall resulting in epidural hematoma requiring surgical evacuation of the hematoma and removal of hardware.  He has a cervical collar in place.  Followed by Dr. Lovell SheehanJenkins with neurosurgery.  Continue with cervical collar for now.  Plan is to have it removed by Dr. Lovell SheehanJenkins in mid January.  6. Bilateral nephrolithiasis: Noted on CT scan.  No evidence for  obstruction.  7. Incidental cholelithiasis: Noted on CT scan.  No evidence for acute cholecystitis.  LFTs are normal.  8. Chronic hypotension: Continue with midodrine.  Seems to be stable currently.  9. Anemia of chronic disease/vitamin B12 deficiency/acute blood loss anemia: Hemoglobin stable today compared to previous values.  Monitor daily for now.  10. History of BPH: Continue Flomax.  11. Severe protein calorie malnutrition: Nutritionist to see.  12. Goals of care: Patient with steady decline over the last few months.  Seen by palliative care during his previous hospitalization.  Patient and family wanted to continue with full scope of care.  We will reinvolve them during this admission as well.  DVT Prophylaxis: SCDs for now Code Status: Full code.  This was discussed with the patient along with the niece who was at bedside. Family Communication: Discussed with niece Disposition: Likely return back to SNF when medically improved Consults called: General surgery. Admission Status: Status is: Inpatient  Remains inpatient appropriate because:Ongoing diagnostic testing needed not appropriate for outpatient work up, IV treatments appropriate due to intensity of illness or inability to take PO and Inpatient level of care appropriate due to severity of illness   Dispo: The patient is from: SNF              Anticipated d/c is to: SNF              Anticipated d/c date is: > 3 days              Patient currently is not medically stable to d/c.     Severity of Illness: The appropriate patient status for this patient is INPATIENT. Inpatient status is judged to be reasonable and necessary in order to provide the required intensity of service to ensure the patient's safety. The patient's presenting symptoms, physical exam findings, and initial radiographic and laboratory data in the context of their chronic comorbidities is felt to place them at high risk for further clinical deterioration.  Furthermore, it is not anticipated that the patient will be medically stable for discharge from the hospital within 2 midnights of admission. The following factors support the patient status of inpatient.   " The patient's presenting symptoms include abdominal pain. " The worrisome physical exam findings include hematuria, sacral decubitus. " The initial radiographic and laboratory data are worrisome because of concern for infection of the sacral wound. " The chronic co-morbidities include quadriparesis.   * I certify that at the point of admission it is my clinical judgment that the patient will require inpatient hospital care spanning beyond 2 midnights from the point of admission due to high intensity of service, high risk for further deterioration and high frequency of surveillance required.*    Further management decisions will depend on results of further testing and patient's response to treatment.   Rhetta Cleek [employer]  [facility] [employer] on [email or web address]  06/19/2020, 5:48 PM

## 2020-06-20 DIAGNOSIS — G959 Disease of spinal cord, unspecified: Secondary | ICD-10-CM

## 2020-06-20 DIAGNOSIS — N390 Urinary tract infection, site not specified: Secondary | ICD-10-CM | POA: Diagnosis not present

## 2020-06-20 DIAGNOSIS — E43 Unspecified severe protein-calorie malnutrition: Secondary | ICD-10-CM

## 2020-06-20 DIAGNOSIS — Z7189 Other specified counseling: Secondary | ICD-10-CM | POA: Diagnosis not present

## 2020-06-20 DIAGNOSIS — Z978 Presence of other specified devices: Secondary | ICD-10-CM | POA: Diagnosis not present

## 2020-06-20 DIAGNOSIS — Z515 Encounter for palliative care: Secondary | ICD-10-CM

## 2020-06-20 DIAGNOSIS — L89150 Pressure ulcer of sacral region, unstageable: Secondary | ICD-10-CM | POA: Diagnosis not present

## 2020-06-20 DIAGNOSIS — R319 Hematuria, unspecified: Secondary | ICD-10-CM | POA: Diagnosis not present

## 2020-06-20 LAB — COMPREHENSIVE METABOLIC PANEL
ALT: 12 U/L (ref 0–44)
AST: 22 U/L (ref 15–41)
Albumin: 1.9 g/dL — ABNORMAL LOW (ref 3.5–5.0)
Alkaline Phosphatase: 71 U/L (ref 38–126)
Anion gap: 10 (ref 5–15)
BUN: 11 mg/dL (ref 8–23)
CO2: 27 mmol/L (ref 22–32)
Calcium: 8.2 mg/dL — ABNORMAL LOW (ref 8.9–10.3)
Chloride: 105 mmol/L (ref 98–111)
Creatinine, Ser: 0.47 mg/dL — ABNORMAL LOW (ref 0.61–1.24)
GFR, Estimated: >60 mL/min (ref >60)
Glucose, Bld: 85 mg/dL (ref 70–99)
Potassium: 3.2 mmol/L — ABNORMAL LOW (ref 3.5–5.1)
Sodium: 142 mmol/L (ref 135–145)
Total Bilirubin: 0.9 mg/dL (ref 0.3–1.2)
Total Protein: 5.7 g/dL — ABNORMAL LOW (ref 6.5–8.1)

## 2020-06-20 LAB — PROTIME-INR
INR: 1.4 — ABNORMAL HIGH (ref 0.8–1.2)
Prothrombin Time: 16.7 seconds — ABNORMAL HIGH (ref 11.4–15.2)

## 2020-06-20 LAB — CBC
HCT: 27.8 % — ABNORMAL LOW (ref 39.0–52.0)
Hemoglobin: 8.2 g/dL — ABNORMAL LOW (ref 13.0–17.0)
MCH: 29.3 pg (ref 26.0–34.0)
MCHC: 29.5 g/dL — ABNORMAL LOW (ref 30.0–36.0)
MCV: 99.3 fL (ref 80.0–100.0)
Platelets: 198 10*3/uL (ref 150–400)
RBC: 2.8 MIL/uL — ABNORMAL LOW (ref 4.22–5.81)
RDW: 15.7 % — ABNORMAL HIGH (ref 11.5–15.5)
WBC: 11 10*3/uL — ABNORMAL HIGH (ref 4.0–10.5)
nRBC: 0 % (ref 0.0–0.2)

## 2020-06-20 LAB — C-REACTIVE PROTEIN: CRP: 5.7 mg/dL — ABNORMAL HIGH (ref <1.0)

## 2020-06-20 LAB — MAGNESIUM: Magnesium: 1.8 mg/dL (ref 1.7–2.4)

## 2020-06-20 MED ORDER — ENSURE ENLIVE PO LIQD
237.0000 mL | Freq: Two times a day (BID) | ORAL | Status: DC
  Administered 2020-06-20 – 2020-06-21 (×3): 237 mL via ORAL

## 2020-06-20 MED ORDER — CHLORHEXIDINE GLUCONATE CLOTH 2 % EX PADS
6.0000 | MEDICATED_PAD | Freq: Every day | CUTANEOUS | Status: DC
  Administered 2020-06-20 – 2020-06-26 (×7): 6 via TOPICAL

## 2020-06-20 MED ORDER — ORAL CARE MOUTH RINSE
15.0000 mL | Freq: Two times a day (BID) | OROMUCOSAL | Status: DC
  Administered 2020-06-20 – 2020-06-26 (×12): 15 mL via OROMUCOSAL

## 2020-06-20 MED ORDER — LACTATED RINGERS IV SOLN: INTRAVENOUS | Status: DC

## 2020-06-20 MED ORDER — POTASSIUM CHLORIDE 10 MEQ/100ML IV SOLN
10.0000 meq | INTRAVENOUS | Status: AC
  Administered 2020-06-20 (×2): 10 meq via INTRAVENOUS
  Filled 2020-06-20 (×2): qty 100

## 2020-06-20 NOTE — ED Notes (Signed)
Called report to Renee,RN.

## 2020-06-20 NOTE — Progress Notes (Signed)
   06/20/20 1405  Assess: MEWS Score  Temp 98.6 F (37 C)  BP 104/63  Pulse Rate (!) 116  Resp 20  SpO2 97 %  O2 Device Room Air  O2 Flow Rate (L/min) 0 L/min  Assess: MEWS Score  MEWS Temp 0  MEWS Systolic 0  MEWS Pulse 2  MEWS RR 0  MEWS LOC 0  MEWS Score 2  MEWS Score Color Yellow  Assess: if the MEWS score is Yellow or Red  Were vital signs taken at a resting state? Yes  Focused Assessment No change from prior assessment  Early Detection of Sepsis Score *See Row Information* High  MEWS guidelines implemented *See Row Information* Yes  Treat  MEWS Interventions Administered scheduled meds/treatments  Pain Scale 0-10  Pain Score 0  Take Vital Signs  Increase Vital Sign Frequency  Yellow: Q 2hr X 2 then Q 4hr X 2, if remains yellow, continue Q 4hrs  Escalate  MEWS: Escalate Yellow: discuss with charge nurse/RN and consider discussing with provider and RRT  Notify: Charge Nurse/RN  Name of Charge Nurse/RN Notified [place]  Date Charge Nurse/RN Notified 06/20/20  Time Charge Nurse/RN Notified 1422  Notify: Provider  Provider Name/Title Dr. Natale Milch  Date Provider Notified 06/20/20  Time Provider Notified 1426  Notification Type Rounds  Notification Reason Other (Comment) (elevated HR)  Response No new orders  Document  Patient Outcome Stabilized after interventions  Progress note created (see row info) Yes  Patient resting comfortably. MD notified of Yellow MEWS due to HR. No new orders, will continue to monitor.

## 2020-06-20 NOTE — Consult Note (Addendum)
WOC consult requested for sacrum pressure injury.  Surgical team is now following for assessment and plan of care.  Please refer to their team for further questions. Please re-consult if further assistance is needed.  Thank-you,  Cammie Mcgee MSN, RN, CWOCN, [place], CNS [phone]

## 2020-06-20 NOTE — Consult Note (Signed)
[facility] Surgery Consult Note  Shannon [name] 09/27/1945  [id number].    Requesting MD: Tawanna Solo, MD Chief Complaint/Reason for Consult: sacral decubitus ulcer  HPI:  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 Per chart review, Post-operatively he had a fall at home resulting in epidural hematoma requiring re-exploration, removal of hardware and evacuation of hematoma on October 30. He has been in a nursing home with chronic indwelling foley since that time. He has developed decubitus ulcers as a result of his immobility and also had a recent admission 05/03/20 for sepsis due to UTI and again 05/22/20 where he was noted to be COVID positive. He was sent to the ED from SNF due to hematuria. General surgery has been asked to evaluate sacral decubitus ulcers. Per chart review he was taking Eliquis at the SNF for DVT ppx. This has currently been held.  ROS: poor historian  Review of Systems  Unable to perform ROS: Other   Family History  Problem Relation Age of Onset  . Heart attack Mother   . Heart disease Mother   . High blood pressure Mother   . Alcohol abuse Father   . Cancer Father   . Diabetes Father   . High blood pressure Father     Past Medical History:  Diagnosis Date  . Asthma   . 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 Sherwood Gambler, 1992-->surg at [facility].  Summer 2021->Cervicalgia,arm pain, hand atrophy RUE wkness, hyperreflex (Dr. 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 Hay fever   . Hypertension   . Osteoarthritis of both hands     Past Surgical History:  Procedure Laterality Date  . ANTERIOR CERVICAL DECOMP/DISCECTOMY FUSION N/A 04/05/2020   Procedure: ANTERIOR CERVICAL DECOMPRESSION/DISCECTOMY FUSION, INTERBODY PROSTHESIS, PLATE/SCREWS CERVICAL  THREE-CERVICAL FOUR, CERVICAL FOUR- CERVICAL FIVE;  Surgeon: Newman Pies, MD;  Location: [place];  Service: Neurosurgery;  Laterality: N/A;  . ANTERIOR CERVICAL DECOMP/DISCECTOMY FUSION N/A 04/22/2020   Procedure: Reexploration of anterior cervical wound for epidural hematoma;  Surgeon: Kary Kos, MD;  Location: [place];  Service: Neurosurgery;  Laterality: N/A;  . BACK SURGERY    . INCISION AND DRAINAGE Left 2012   L hand infection  . ROTATOR CUFF REPAIR Right    x 3  . SPINE SURGERY      Social History:  reports that he has quit smoking. He has never used smokeless tobacco. He reports previous alcohol use. He reports that he does not use drugs.  Allergies:  Allergies  Allergen Reactions  . Iodine Swelling  . Penicillins Swelling    ** tolerates cephalosporins Facial swelling, itchy throat    Medications Prior to Admission  Medication Sig Dispense Refill  . acetaminophen (TYLENOL) 500 MG tablet Take 1,000 mg by mouth 3 (three) times daily.     Marland Kitchen albuterol (VENTOLIN HFA) 108 (90 Base) MCG/ACT inhaler Inhale 2 puffs into the lungs daily.    Marland Kitchen ascorbic acid (VITAMIN C) 500 MG tablet Take 1,000 mg by mouth 3 (three) times daily.    Marland Kitchen docusate sodium (COLACE) 100 MG capsule Take 1 capsule (100 mg total) by mouth 2 (two) times daily. 10 capsule 0  . ferrous sulfate 325 (65 FE) MG tablet Take 1 tablet (325 mg total) by mouth 2 (two) times daily with a  meal.    . midodrine (PROAMATINE) 10 MG tablet Take 1 tablet (10 mg total) by mouth 2 (two) times daily with a meal.    . oxyCODONE (OXY IR/ROXICODONE) 5 MG immediate release tablet Take 1 tablet (5 mg total) by mouth every 8 (eight) hours as needed for moderate pain or severe pain. 5 tablet 0  . pantoprazole (PROTONIX) 40 MG tablet Take 1 tablet (40 mg total) by mouth at bedtime.    . polyethylene glycol (MIRALAX / GLYCOLAX) 17 g packet Take 17 g by mouth every morning. Mix in 6 oz water and drink    . scopolamine (TRANSDERM-SCOP) 1  MG/3DAYS Place 1 patch onto the skin every 3 (three) days.    . tamsulosin (FLOMAX) 0.4 MG CAPS capsule Take 1 capsule (0.4 mg total) by mouth daily. 30 capsule 1  . aspirin 325 MG tablet Take 325 mg by mouth every morning.    . feeding supplement (ENSURE ENLIVE / ENSURE PLUS) LIQD Take 237 mLs by mouth 4 (four) times daily. 237 mL 12    Blood pressure 122/67, pulse (!) 105, temperature 97.8 F (36.6 C), temperature source Oral, resp. rate 18, height 6' (1.829 m), weight 106 kg, SpO2 96 %. Physical Exam: Constitutional: NAD; answering questions intermittently  Eyes: Moist conjunctiva; no lid lag; anicteric; PERRL Neck: Trachea midline; no thyromegaly Lungs: Normal respiratory effort ORA; coughing during my exam, no tactile fremitus CV: RRR; no palpable thrills; no pitting edema GI: Abd soft non-tender, no palpable hepatosplenomegaly GU: patient with 3 decubitus ulcer, stage 1 decubitus over left superior buttock, small, clean ulcer above R buttock just lateral to midline, and a large ~10-14 cm sacral decubitus ulcer that is comprised of primarily necrotic tissue, there is no tunneling, no purulence, no cellulitis.  MSK: symmetrical, upper extremities are edematous, no clubbing/cyanosis Psychiatric:oriented to person, asked if it was new years, intermittently answering questions Lymphatic: No palpable cervical or axillary lymphadenopathy  Results for orders placed or performed during the hospital encounter of 06/19/20 (from the past 48 hour(s))  CBC with Differential     Status: Abnormal   Collection Time: 06/19/20  1:12 PM  Result Value Ref Range   WBC 12.5 (H) 4.0 - 10.5 K/uL   RBC 3.02 (L) 4.22 - 5.81 MIL/uL   Hemoglobin 9.0 (L) 13.0 - 17.0 g/dL   HCT 29.3 (L) 39.0 - 52.0 %   MCV 97.0 80.0 - 100.0 fL   MCH 29.8 26.0 - 34.0 pg   MCHC 30.7 30.0 - 36.0 g/dL   RDW 15.6 (H) 11.5 - 15.5 %   Platelets 237 150 - 400 K/uL   nRBC 0.0 0.0 - 0.2 %   Neutrophils Relative % 75 %   Neutro Abs  9.5 (H) 1.7 - 7.7 K/uL   Lymphocytes Relative 12 %   Lymphs Abs 1.5 0.7 - 4.0 K/uL   Monocytes Relative 10 %   Monocytes Absolute 1.2 (H) 0.1 - 1.0 K/uL   Eosinophils Relative 0 %   Eosinophils Absolute 0.0 0.0 - 0.5 K/uL   Basophils Relative 1 %   Basophils Absolute 0.1 0.0 - 0.1 K/uL   Immature Granulocytes 2 %   Abs Immature Granulocytes 0.20 (H) 0.00 - 0.07 K/uL    Comment: Performed at [facility], [place] [address] [place], [place] [address]  Comprehensive metabolic panel     Status: Abnormal   Collection Time: 06/19/20  1:12 PM  Result Value Ref Range   Sodium 141 135 -  145 mmol/L   Potassium 3.3 (L) 3.5 - 5.1 mmol/L   Chloride 102 98 - 111 mmol/L   CO2 30 22 - 32 mmol/L   Glucose, Bld 115 (H) 70 - 99 mg/dL    Comment: Glucose reference range applies only to samples taken after fasting for at least 8 hours.   BUN 13 8 - 23 mg/dL   Creatinine, Ser 0.57 (L) 0.61 - 1.24 mg/dL   Calcium 8.3 (L) 8.9 - 10.3 mg/dL   Total Protein 6.2 (L) 6.5 - 8.1 g/dL   Albumin 2.0 (L) 3.5 - 5.0 g/dL   AST 22 15 - 41 U/L   ALT 13 0 - 44 U/L   Alkaline Phosphatase 86 38 - 126 U/L   Total Bilirubin 0.9 0.3 - 1.2 mg/dL   GFR, Estimated >60 >60 mL/min    Comment: (NOTE) Calculated using the CKD-EPI Creatinine Equation (2021)    Anion gap 9 5 - 15    Comment: Performed at [facility], [place] [address], [place], [place] [address]  Protime-INR     Status: Abnormal   Collection Time: 06/19/20  1:12 PM  Result Value Ref Range   Prothrombin Time 15.9 (H) 11.4 - 15.2 seconds   INR 1.3 (H) 0.8 - 1.2    Comment: (NOTE) INR goal varies based on device and disease states. Performed at [facility], [place] [address], [place], [place] [address]   Urinalysis, Routine w reflex microscopic Urine, Catheterized     Status: Abnormal   Collection Time: 06/19/20  2:14 PM  Result Value Ref Range   Color, Urine RED (A) YELLOW   APPearance TURBID (A) CLEAR    Specific Gravity, Urine  1.005 - 1.030    TEST NOT REPORTED DUE TO COLOR INTERFERENCE OF URINE PIGMENT   pH  5.0 - 8.0    TEST NOT REPORTED DUE TO COLOR INTERFERENCE OF URINE PIGMENT   Glucose, UA (A) NEGATIVE mg/dL    TEST NOT REPORTED DUE TO COLOR INTERFERENCE OF URINE PIGMENT   Hgb urine dipstick (A) NEGATIVE    TEST NOT REPORTED DUE TO COLOR INTERFERENCE OF URINE PIGMENT   Bilirubin Urine (A) NEGATIVE    TEST NOT REPORTED DUE TO COLOR INTERFERENCE OF URINE PIGMENT   Ketones, ur (A) NEGATIVE mg/dL    TEST NOT REPORTED DUE TO COLOR INTERFERENCE OF URINE PIGMENT   Protein, ur (A) NEGATIVE mg/dL    TEST NOT REPORTED DUE TO COLOR INTERFERENCE OF URINE PIGMENT   Nitrite (A) NEGATIVE    TEST NOT REPORTED DUE TO COLOR INTERFERENCE OF URINE PIGMENT   Leukocytes,Ua (A) NEGATIVE    TEST NOT REPORTED DUE TO COLOR INTERFERENCE OF URINE PIGMENT    Comment: Performed at [facility], [place] [address] [place], [place] [address]  Urinalysis, Microscopic (reflex)     Status: Abnormal   Collection Time: 06/19/20  2:14 PM  Result Value Ref Range   RBC / HPF >50 0 - 5 RBC/hpf   WBC, UA >50 0 - 5 WBC/hpf   Bacteria, UA MANY (A) NONE SEEN   Squamous Epithelial / LPF NONE SEEN 0 - 5   Mucus PRESENT     Comment: Performed at [facility], [place] [address], [place], [place] [address]  Comprehensive metabolic panel     Status: Abnormal   Collection Time: 06/20/20  3:48 AM  Result Value Ref Range   Sodium 142 135 - 145 mmol/L   Potassium  3.2 (L) 3.5 - 5.1 mmol/L   Chloride 105 98 - 111 mmol/L   CO2 27 22 - 32 mmol/L   Glucose, Bld 85 70 - 99 mg/dL    Comment: Glucose reference range applies only to samples taken after fasting for at least 8 hours.   BUN 11 8 - 23 mg/dL   Creatinine, Ser 0.47 (L) 0.61 - 1.24 mg/dL   Calcium 8.2 (L) 8.9 - 10.3 mg/dL   Total Protein 5.7 (L) 6.5 - 8.1 g/dL   Albumin 1.9 (L) 3.5 - 5.0 g/dL   AST 22 15 - 41 U/L   ALT 12 0 - 44 U/L    Alkaline Phosphatase 71 38 - 126 U/L   Total Bilirubin 0.9 0.3 - 1.2 mg/dL   GFR, Estimated >60 >60 mL/min    Comment: (NOTE) Calculated using the CKD-EPI Creatinine Equation (2021)    Anion gap 10 5 - 15    Comment: Performed at [facility], [place] [address], [place], [place] [address]  CBC     Status: Abnormal   Collection Time: 06/20/20  3:48 AM  Result Value Ref Range   WBC 11.0 (H) 4.0 - 10.5 K/uL   RBC 2.80 (L) 4.22 - 5.81 MIL/uL   Hemoglobin 8.2 (L) 13.0 - 17.0 g/dL   HCT 27.8 (L) 39.0 - 52.0 %   MCV 99.3 80.0 - 100.0 fL   MCH 29.3 26.0 - 34.0 pg   MCHC 29.5 (L) 30.0 - 36.0 g/dL   RDW 15.7 (H) 11.5 - 15.5 %   Platelets 198 150 - 400 K/uL   nRBC 0.0 0.0 - 0.2 %    Comment: Performed at [facility], [place] [address] [place], [place] [address]  Protime-INR     Status: Abnormal   Collection Time: 06/20/20  3:48 AM  Result Value Ref Range   Prothrombin Time 16.7 (H) 11.4 - 15.2 seconds   INR 1.4 (H) 0.8 - 1.2    Comment: (NOTE) INR goal varies based on device and disease states. Performed at [facility], [place] [address], [place], [place] [address]   Magnesium     Status: None   Collection Time: 06/20/20  3:48 AM  Result Value Ref Range   Magnesium 1.8 1.7 - 2.4 mg/dL    Comment: Performed at [facility], [place] [address] [place], [place] [address]  C-reactive protein     Status: Abnormal   Collection Time: 06/20/20  3:48 AM  Result Value Ref Range   CRP 5.7 (H) <1.0 mg/dL    Comment: Performed at [facility], [place] [address], [place], [place] [address]   CT ABDOMEN PELVIS WO CONTRAST  Result Date: 06/19/2020 CLINICAL DATA:  Abdominal pain, hematuria since Thursday. Suprapubic catheter in place. RIGHT lower quadrant abdominal pain. COVID positive. EXAM: CT ABDOMEN AND PELVIS WITHOUT CONTRAST TECHNIQUE: Multidetector CT imaging of the abdomen and pelvis was performed  following the standard protocol without IV contrast. COMPARISON:  Chest CT dated 05/03/2020. FINDINGS: Lower chest: Patchy small consolidations at the bilateral lung bases, RIGHT greater than LEFT. RIGHT pleural effusion, small to moderate in size. Hepatobiliary: No focal liver abnormality is seen. 1.1 cm gallstone. Gallbladder appears otherwise unremarkable. No bile duct dilatation is seen. Pancreas: Diffusely infiltrated with fat but otherwise unremarkable. No peripancreatic fluid. Spleen: Normal in size without focal abnormality. Adrenals/Urinary Tract: Adrenal glands are unremarkable. 2 LEFT renal stones, each measuring 2-3 mm, nonobstructive. Single 3 mm  nonobstructing RIGHT renal stone. No hydronephrosis is seen bilaterally. No ureteral or bladder calculi are identified. Bladder is decompressed by Foley catheter. Contents of the bladder are hyperdense, presumably blood products. Stomach/Bowel: No dilated large or small bowel loops. Diverticulosis of the sigmoid and descending colon but no focal inflammatory changes seen to suggest acute diverticulitis. Moderate amount of stool and gas in the ascending and transverse colon. Stomach is unremarkable, partially decompressed. Vascular/Lymphatic: Aortic atherosclerosis. No enlarged lymph nodes are seen within the abdomen or pelvis. Reproductive: Prostate is unremarkable. Other: No free fluid or abscess collection is seen. No free intraperitoneal air. Musculoskeletal: No acute or suspicious osseous finding. Degenerative spondylosis at the L1-2 and L2-3 levels, moderate in degree with associated disc space narrowing and osseous spurring. Visualized soft tissues of the abdominal wall are unremarkable. Gas-like collection overlying the coccyx and lower sacrum. IMPRESSION: 1. Patchy small consolidations at the bilateral lung bases, RIGHT greater than LEFT, suggesting COVID-19 pneumonia. RIGHT pleural effusion, small to moderate in size. 2. Bladder is decompressed by  Foley catheter. Contents of the bladder are hyperdense, presumably blood products. 3. Bilateral nephrolithiasis (largest stone measuring 3 mm). No ureteral or bladder calculi. No hydronephrosis. 4. Colonic diverticulosis without evidence of acute diverticulitis. 5. Cholelithiasis without evidence of acute cholecystitis. 6. Ill-defined collection of air overlying the coccyx and lower sacrum, possibly superficial to the patient but concerning for sequela of sacral decubitus ulcer or other infectious process of the soft tissues overlying the sacrum/coccyx. Clinical correlation recommended. Aortic Atherosclerosis (ICD10-I70.0). Electronically Signed   By: Franki Cabot M.D.   On: 06/19/2020 15:49   DG Chest Port 1 View  Result Date: 06/19/2020 CLINICAL DATA:  cough EXAM: PORTABLE CHEST 1 VIEW COMPARISON:  06/06/2020 and prior. FINDINGS: Decreased bibasilar opacities and small pleural effusions. No pneumothorax. Cardiomediastinal silhouette within normal limits. Left shoulder osteoarthrosis. IMPRESSION: Decreased bibasilar opacities and small pleural effusions. Electronically Signed   By: Primitivo Gauze M.D.   On: 06/19/2020 14:40   Assessment/Plan Recurrent hematuria/UTI  Quadriplegia  Chronic indwelling foley  COVID 19 - tested positive Dec 14, received monoclonal antibodies, has a cough, normal work of breathing ORA PMH dysphagia PMH aspiration  Anemia of chronic disease Chronic hypotension   Necrotic sacral decubitus ulcer  Patient is a 74 y/o gentleman with quadriplegia and above medical conditions who re-presented to the hospital from SNF for hematuria. His sacral ulcer has worsened since our practice last evaluated him 05/22/20 and it is now necrotic, but without signs of infection. I agree with the medical teams decision to re-engage palliative care. The patient is bed-bound, chronically ill, with signs of malnutrition, this decubitus ulcer will likely never fully heal. However, if patient  and family would like full scope of treatment, then I would recommend surgical debridement of the decubitus ulcer. We could likely perform surgical debridement in the OR tomorrow under MAC vs general anesthesia. There is also an increased risk of perioperative morbidity and respiratory complications in COVID-positive patients who are placed under general anesthesia, this risk should be considered. We will follow.    Jill Alexanders, PA-C [facility] Please see Amion for pager number during day hours 7:00am-4:30pm 06/20/2020, 9:30 AM

## 2020-06-20 NOTE — Progress Notes (Signed)
PROGRESS NOTE    Shannon OtaRonald Ray Aiken  WJX:[id number] DOB: 16-Dec-1945 DOA: 06/19/2020 PCP: Jeoffrey MassedMcGowen, Philip H, MD   Brief Narrative:  Shannon [name] is a 74 y.o. 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  Subsequently sustained a fall resulting in epidural hematoma, underwent reexploration and removal of hardware and evacuation of hematoma on October 30.  Has been on a cervical collar since then and with plans to keep it on until mid January.  Patient also with history of BPH, chronic indwelling Foley catheter, hematuria.  He had a prolonged hospital stay last month and was actually discharged just a week ago to short-term rehab at a skilled nursing facility [facility]  During his previous admission he had septic shock due to UTI and was also noted to have aspiration pneumonia.  He was also found to be incidentally positive for COVID-19 at that time and received monoclonal antibody treatment.  He was sent from the skilled nursing facility to the ER initially on December 24 for problems with his Foley catheter.  The Foley was replaced at that time and he was sent back to the skilled nursing facility.  He was sent back today to the ER due to hematuria.  Patient was also complaining of lower abdominal pain.  He was also noted to have a cough. CT scan of the abdomen pelvis showed nonobstructing renal stones.  No concerning findings noted in the bladder.  However patient was noted to have ill-defined collection of air overlying the coccyx in the lower sacrum.  He was given broad-spectrum antibiotics and will be hospitalized for further management.  Assessment & Plan:   Principal Problem:   Hematuria Active Problems:   Cervical myelopathy (HCC)   Chronic indwelling Foley catheter   Protein-calorie malnutrition, severe   Acute lower UTI   Sacral decubitus ulcer   COVID-19 virus infection   UTI (urinary tract  infection)   Recurrent hematuria in the setting of chronic indwelling Foley Questionable urinary tract infection in the setting of chronic indwelling Foley catheter POA:  - Dr. Walden FieldManey with urology contacted - indicates occasional hematuria is not uncommon with indwelling Foley, will evaluate for other secondary causes, we appreciate their insight and recommendations - No issues with obstruction, urinary retention or mechanical issues with Foley per patient - Multiple episodes of hematuria over the past few months resolving with bladder irrigation - Placed on antibiotics at admission for questionable UTI, will continue for 3 days per protocol - No structural lesions identified on CT scan.  If hematuria persists then we will involve urology.  Questionable sacral wound with concern for infection:  - Previously seen by general surgery about a month ago when the [facility] - no debridement at that time - Sx following today - likely debridement in the next 24-48h - Continue with antibiotics.  COVID-19 infection, questionable, without hypoxia, POA:  - Patient was incidentally found to be positive for COVID-19 before he was discharged to skilled nursing facility - Positive December 14.  - S/P monoclonal antibodies at that time.   - Continue precautions given quarantine guidelines - Complaining of mild cough - higher risk for aspiration rather than secondary to COVID-19 at this point given his history - He is not hypoxic  History of dysphagia and recent aspiration pneumonia:  - Most likely due to his cervical spine issues.  He has a cervical collar.  Will request speech  therapy to see him.  Keep him n.p.o. for now.  Quadriplegia secondary to cervical myelopathy:  He underwent C-spine surgery on 10/13.  Subsequently sustained a fall resulting in epidural hematoma requiring surgical evacuation of the hematoma and removal of hardware.  He has a cervical collar in place.  Followed by  Dr. Lovell Sheehan with neurosurgery.  Continue with cervical collar for now.  Plan is to have it removed by Dr. Lovell Sheehan in mid January.  Bilateral nephrolithiasis: Noted on CT scan.  No evidence for obstruction.  Incidental cholelithiasis: Noted on CT scan.  No evidence for acute cholecystitis.  LFTs are normal.  Chronic hypotension: Continue with midodrine.  Seems to be stable currently.  Anemia of chronic disease/vitamin B12 deficiency/acute blood loss anemia: Hemoglobin stable today compared to previous values.  Monitor daily for now.  History of BPH: Continue Flomax.  Severe protein calorie malnutrition: Nutritionist to see.  Goals of care: Patient with steady decline over the last few months.  Seen by palliative care during his previous hospitalization.  Patient and family wanted to continue with full scope of care.  We will reinvolve them during this admission as well.  DVT prophylaxis: SCDs Code Status: Full - continue to discuss goals of care as above Family Communication: None present  Status is: Inpt  Dispo: The patient is from: SNF              Anticipated d/c is to: SNF              Anticipated d/c date is: 72 to 96 hours              Patient currently not medically stable for discharge given ongoing need for surgical procedure, antibiotics and further evaluation with urology.  Consultants:   Urology, general surgery  Procedures:   Pending sacral debridement 12/29  Antimicrobials:  Cefepime, Flagyl, vancomycin  Subjective: No acute issues or events overnight denies nausea, vomiting, diarrhea, constipation, headache, fevers, chills.  Objective: Vitals:   06/19/20 2047 06/19/20 2355 06/20/20 0134 06/20/20 0518  BP: 104/67 (!) 105/58 109/61 122/67  Pulse: 97 94 (!) 110 (!) 105  Resp: 14 16 16 18   Temp:  97.7 F (36.5 C) 97.9 F (36.6 C) 97.8 F (36.6 C)  TempSrc:  Oral Oral Oral  SpO2: 93% 94% 94% 96%  Weight:      Height:        Intake/Output  Summary (Last 24 hours) at 06/20/2020 0754 Last data filed at 06/20/2020 0600 Gross per 24 hour  Intake 1832.84 ml  Output 1350 ml  Net 482.84 ml   Filed Weights   06/19/20 1126  Weight: 106 kg    Examination:  General:  Pleasantly resting in bed, No acute distress. HEENT:  Normocephalic atraumatic.  Sclerae nonicteric, noninjected.  Extraocular movements intact bilaterally. Neck: Postsurgical bandages clean dry intact, c-collar in place Lungs:  Clear to auscultate bilaterally without rhonchi, wheeze, or rales. Heart:  Regular rate and rhythm.  Without murmurs, rubs, or gallops. Abdomen/GU:  Soft, nontender, nondistended.  Without guarding or rebound.  Foley draining blood-tinged urine Extremities: Without cyanosis, clubbing, edema, or obvious deformity. Vascular:  Dorsalis pedis and posterior tibial pulses palpable bilaterally. Skin:  Warm and dry, no erythema, no ulcerations.    Data Reviewed: I have personally reviewed following labs and imaging studies  CBC: Recent Labs  Lab 06/19/20 1312 06/20/20 0348  WBC 12.5* 11.0*  NEUTROABS 9.5*  --   HGB 9.0* 8.2*  HCT 29.3* 27.8*  MCV 97.0 99.3  PLT 237 198   Basic Metabolic Panel: Recent Labs  Lab 06/19/20 1312 06/20/20 0348  NA 141 142  K 3.3* 3.2*  CL 102 105  CO2 30 27  GLUCOSE 115* 85  BUN 13 11  CREATININE 0.57* 0.47*  CALCIUM 8.3* 8.2*  MG  --  1.8   GFR: Estimated Creatinine Clearance: 102 mL/min (A) (by C-G formula based on SCr of 0.47 mg/dL (L)). Liver Function Tests: Recent Labs  Lab 06/19/20 1312 06/20/20 0348  AST 22 22  ALT 13 12  ALKPHOS 86 71  BILITOT 0.9 0.9  PROT 6.2* 5.7*  ALBUMIN 2.0* 1.9*   No results for input(s): LIPASE, AMYLASE in the last 168 hours. No results for input(s): AMMONIA in the last 168 hours. Coagulation Profile: Recent Labs  Lab 06/19/20 1312 06/20/20 0348  INR 1.3* 1.4*   Cardiac Enzymes: No results for input(s): CKTOTAL, CKMB, CKMBINDEX, TROPONINI in the  last 168 hours. BNP (last 3 results) No results for input(s): PROBNP in the last 8760 hours. HbA1C: No results for input(s): HGBA1C in the last 72 hours. CBG: No results for input(s): GLUCAP in the last 168 hours. Lipid Profile: No results for input(s): CHOL, HDL, LDLCALC, TRIG, CHOLHDL, LDLDIRECT in the last 72 hours. Thyroid Function Tests: No results for input(s): TSH, T4TOTAL, FREET4, T3FREE, THYROIDAB in the last 72 hours. Anemia Panel: No results for input(s): VITAMINB12, FOLATE, FERRITIN, TIBC, IRON, RETICCTPCT in the last 72 hours. Sepsis Labs: No results for input(s): PROCALCITON, LATICACIDVEN in the last 168 hours.  Recent Results (from the past 240 hour(s))  Urine culture     Status: None   Collection Time: 06/16/20  4:00 AM   Specimen: Urine, Random  Result Value Ref Range Status   Specimen Description   Final    URINE, RANDOM Performed at [facility], [address], [place], [place] [address]    Special Requests   Final    NONE Performed at [facility], [address], [place], [place] [address]    Culture   Final    NO GROWTH Performed at [facility], [address], [place], [place] [address]    Report Status 06/17/2020 FINAL  Final     Radiology Studies: CT ABDOMEN PELVIS WO CONTRAST  Result Date: 06/19/2020 CLINICAL DATA:  Abdominal pain, hematuria since Thursday. Suprapubic catheter in place. RIGHT lower quadrant abdominal pain. COVID positive. EXAM: CT ABDOMEN AND PELVIS WITHOUT CONTRAST TECHNIQUE: Multidetector CT imaging of the abdomen and pelvis was performed following the standard protocol without IV contrast. COMPARISON:  Chest CT dated 05/03/2020. FINDINGS: Lower chest: Patchy small consolidations at the bilateral lung bases, RIGHT greater than LEFT. RIGHT pleural effusion, small to moderate in size. Hepatobiliary: No focal liver abnormality is seen. 1.1 cm gallstone. Gallbladder appears otherwise  unremarkable. No bile duct dilatation is seen. Pancreas: Diffusely infiltrated with fat but otherwise unremarkable. No peripancreatic fluid. Spleen: Normal in size without focal abnormality. Adrenals/Urinary Tract: Adrenal glands are unremarkable. 2 LEFT renal stones, each measuring 2-3 mm, nonobstructive. Single 3 mm nonobstructing RIGHT renal stone. No hydronephrosis is seen bilaterally. No ureteral or bladder calculi are identified. Bladder is decompressed by Foley catheter. Contents of the bladder are hyperdense, presumably blood products. Stomach/Bowel: No dilated large or small bowel loops. Diverticulosis of the sigmoid and descending colon but no focal inflammatory changes seen to suggest acute diverticulitis. Moderate amount of stool and gas in the ascending and transverse colon.  Stomach is unremarkable, partially decompressed. Vascular/Lymphatic: Aortic atherosclerosis. No enlarged lymph nodes are seen within the abdomen or pelvis. Reproductive: Prostate is unremarkable. Other: No free fluid or abscess collection is seen. No free intraperitoneal air. Musculoskeletal: No acute or suspicious osseous finding. Degenerative spondylosis at the L1-2 and L2-3 levels, moderate in degree with associated disc space narrowing and osseous spurring. Visualized soft tissues of the abdominal wall are unremarkable. Gas-like collection overlying the coccyx and lower sacrum. IMPRESSION: 1. Patchy small consolidations at the bilateral lung bases, RIGHT greater than LEFT, suggesting COVID-19 pneumonia. RIGHT pleural effusion, small to moderate in size. 2. Bladder is decompressed by Foley catheter. Contents of the bladder are hyperdense, presumably blood products. 3. Bilateral nephrolithiasis (largest stone measuring 3 mm). No ureteral or bladder calculi. No hydronephrosis. 4. Colonic diverticulosis without evidence of acute diverticulitis. 5. Cholelithiasis without evidence of acute cholecystitis. 6. Ill-defined collection of  air overlying the coccyx and lower sacrum, possibly superficial to the patient but concerning for sequela of sacral decubitus ulcer or other infectious process of the soft tissues overlying the sacrum/coccyx. Clinical correlation recommended. Aortic Atherosclerosis (ICD10-I70.0). Electronically Signed   By: Bary Richard M.D.   On: 06/19/2020 15:49   DG Chest Port 1 View  Result Date: 06/19/2020 CLINICAL DATA:  cough EXAM: PORTABLE CHEST 1 VIEW COMPARISON:  06/06/2020 and prior. FINDINGS: Decreased bibasilar opacities and small pleural effusions. No pneumothorax. Cardiomediastinal silhouette within normal limits. Left shoulder osteoarthrosis. IMPRESSION: Decreased bibasilar opacities and small pleural effusions. Electronically Signed   By: Stana Bunting M.D.   On: 06/19/2020 14:40    Scheduled Meds: . Chlorhexidine Gluconate Cloth  6 each Topical Daily  . docusate sodium  100 mg Oral BID  . ferrous sulfate  325 mg Oral BID WC  . mouth rinse  15 mL Mouth Rinse BID  . midodrine  10 mg Oral BID WC  . pantoprazole  40 mg Oral QHS  . polyethylene glycol  17 g Oral BID  . tamsulosin  0.4 mg Oral Daily   Continuous Infusions: . 0.45 % NaCl with KCl 20 mEq / L 75 mL/hr at 06/20/20 0500  . ceFEPime (MAXIPIME) IV 2 g (06/20/20 0240)  . metronidazole 500 mg (06/20/20 0321)  . vancomycin       LOS: 1 day   Time spent:  Azucena Fallen, DO [facility]  If 7PM-7AM, please contact night-coverage [email or web address]  06/20/2020, 7:54 AM

## 2020-06-20 NOTE — Consult Note (Signed)
Reason for Consult: Gross Hematuria - ,  Quadriplegic Bladder Management -   Referring Physician: Carma Leaven MD  Shannon [name] is an 74 y.o. male.   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 CT x several with punctate renal stones, no upper tract masses. No cysto yet. He has been on eliquus, now held.   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 No baseline urodynamics.  PMH sig for Chiari, C spine surgery, frailty / malnutrition / sacral decubiti / lives [facility] at baseline.   Today " Shannon [name] " is seen in consultation for above. Currently admitted with FTT, hematuria. Cr 0.8, Hgb 9 (stable at baseline).  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 Newell Coral, 1992-->surg at [facility].  Summer 2021->Cervicalgia,arm pain, hand atrophy RUE wkness, hyperreflex (Dr. 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->to get surgery 03/2020   Hay fever    Hypertension    Osteoarthritis of both hands     Past Surgical History:  Procedure Laterality Date   ANTERIOR CERVICAL DECOMP/DISCECTOMY FUSION N/A 04/05/2020   Procedure: ANTERIOR CERVICAL DECOMPRESSION/DISCECTOMY FUSION, INTERBODY PROSTHESIS, PLATE/SCREWS CERVICAL THREE-CERVICAL FOUR, CERVICAL FOUR- CERVICAL FIVE;  Surgeon: Tressie Stalker, MD;  Location: [facility] OR;  Service: Neurosurgery;  Laterality: N/A;   ANTERIOR CERVICAL DECOMP/DISCECTOMY FUSION N/A 04/22/2020   Procedure: Reexploration of anterior cervical wound for epidural hematoma;  Surgeon: Donalee Citrin, MD;  Location: [facility] OR;  Service: Neurosurgery;  Laterality: N/A;   BACK SURGERY     INCISION AND DRAINAGE Left 2012   L hand infection   ROTATOR CUFF REPAIR Right    x 3    SPINE SURGERY      Family History  Problem Relation Age of Onset   Heart attack Mother    Heart disease Mother    High blood pressure Mother    Alcohol abuse Father    Cancer Father    Diabetes Father    High blood pressure Father     Social History:  reports that he has quit smoking. He has never used smokeless tobacco. He reports previous alcohol use. He reports that he does not use drugs.  Allergies:  Allergies  Allergen Reactions   Iodine Swelling   Penicillins Swelling    ** tolerates cephalosporins Facial swelling, itchy throat    Medications: I have reviewed the patient's current medications.  Results for orders placed or performed during the hospital encounter of 06/19/20 (from the past 48 hour(s))  CBC with Differential     Status: Abnormal   Collection Time: 06/19/20  1:12 PM  Result Value Ref Range   WBC 12.5 (H) 4.0 - 10.5 K/uL   RBC 3.02 (L) 4.22 - 5.81 MIL/uL   Hemoglobin 9.0 (L) 13.0 - 17.0 g/dL   HCT 83.4 (L) 19.6 - 22.2 %   MCV 97.0 80.0 - 100.0 fL   MCH 29.8 26.0 - 34.0 pg   MCHC 30.7 30.0 - 36.0 g/dL   RDW 97.9 (H) 89.2 - 11.9 %   Platelets 237 150 - 400 K/uL   nRBC 0.0 0.0 - 0.2 %   Neutrophils Relative % 75 %   Neutro Abs 9.5 (H) 1.7 - 7.7 K/uL  Lymphocytes Relative 12 %   Lymphs Abs 1.5 0.7 - 4.0 K/uL   Monocytes Relative 10 %   Monocytes Absolute 1.2 (H) 0.1 - 1.0 K/uL   Eosinophils Relative 0 %   Eosinophils Absolute 0.0 0.0 - 0.5 K/uL   Basophils Relative 1 %   Basophils Absolute 0.1 0.0 - 0.1 K/uL   Immature Granulocytes 2 %   Abs Immature Granulocytes 0.20 (H) 0.00 - 0.07 K/uL    Comment: Performed at [facility], [address] [place], [place] [address]  Comprehensive metabolic panel     Status: Abnormal   Collection Time: 06/19/20  1:12 PM  Result Value Ref Range   Sodium 141 135 - 145 mmol/L   Potassium 3.3 (L) 3.5 - 5.1 mmol/L   Chloride 102 98 - 111 mmol/L   CO2 30 22 - 32 mmol/L   Glucose, Bld 115  (H) 70 - 99 mg/dL    Comment: Glucose reference range applies only to samples taken after fasting for at least 8 hours.   BUN 13 8 - 23 mg/dL   Creatinine, Ser 6.040.57 (L) 0.61 - 1.24 mg/dL   Calcium 8.3 (L) 8.9 - 10.3 mg/dL   Total Protein 6.2 (L) 6.5 - 8.1 g/dL   Albumin 2.0 (L) 3.5 - 5.0 g/dL   AST 22 15 - 41 U/L   ALT 13 0 - 44 U/L   Alkaline Phosphatase 86 38 - 126 U/L   Total Bilirubin 0.9 0.3 - 1.2 mg/dL   GFR, Estimated >54>60 >09>60 mL/min    Comment: (NOTE) Calculated using the CKD-EPI Creatinine Equation (2021)    Anion gap 9 5 - 15    Comment: Performed at [facility], [address] [place], [place] [address]  Protime-INR     Status: Abnormal   Collection Time: 06/19/20  1:12 PM  Result Value Ref Range   Prothrombin Time 15.9 (H) 11.4 - 15.2 seconds   INR 1.3 (H) 0.8 - 1.2    Comment: (NOTE) INR goal varies based on device and disease states. Performed at [facility], [address] [place], [place] [address]   Urinalysis, Routine w reflex microscopic Urine, Catheterized     Status: Abnormal   Collection Time: 06/19/20  2:14 PM  Result Value Ref Range   Color, Urine RED (A) YELLOW   APPearance TURBID (A) CLEAR   Specific Gravity, Urine  1.005 - 1.030    TEST NOT REPORTED DUE TO COLOR INTERFERENCE OF URINE PIGMENT   pH  5.0 - 8.0    TEST NOT REPORTED DUE TO COLOR INTERFERENCE OF URINE PIGMENT   Glucose, UA (A) NEGATIVE mg/dL    TEST NOT REPORTED DUE TO COLOR INTERFERENCE OF URINE PIGMENT   Hgb urine dipstick (A) NEGATIVE    TEST NOT REPORTED DUE TO COLOR INTERFERENCE OF URINE PIGMENT   Bilirubin Urine (A) NEGATIVE    TEST NOT REPORTED DUE TO COLOR INTERFERENCE OF URINE PIGMENT   Ketones, ur (A) NEGATIVE mg/dL    TEST NOT REPORTED DUE TO COLOR INTERFERENCE OF URINE PIGMENT   Protein, ur (A) NEGATIVE mg/dL    TEST NOT REPORTED DUE TO COLOR INTERFERENCE OF URINE PIGMENT   Nitrite (A) NEGATIVE    TEST NOT REPORTED DUE TO COLOR  INTERFERENCE OF URINE PIGMENT   Leukocytes,Ua (A) NEGATIVE    TEST NOT REPORTED DUE TO COLOR INTERFERENCE OF URINE PIGMENT    Comment: Performed at [facility], [address] [place], [place] [address]  Urine culture     Status: None (Preliminary result)   Collection Time: 06/19/20  2:14 PM   Specimen: Urine, Random  Result Value Ref Range   Specimen Description      URINE, RANDOM Performed at [facility], [address] [place], [place] [address]    Special Requests      NONE Performed at [facility], [address] [place], [place] [address]    Culture      CULTURE REINCUBATED FOR BETTER GROWTH Performed at [facility], [address], [place], [place] [address]    Report Status PENDING   Urinalysis, Microscopic (reflex)     Status: Abnormal   Collection Time: 06/19/20  2:14 PM  Result Value Ref Range   RBC / HPF >50 0 - 5 RBC/hpf   WBC, UA >50 0 - 5 WBC/hpf   Bacteria, UA MANY (A) NONE SEEN   Squamous Epithelial / LPF NONE SEEN 0 - 5   Mucus PRESENT     Comment: Performed at [facility], [address] [place], [place] [address]  Comprehensive metabolic panel     Status: Abnormal   Collection Time: 06/20/20  3:48 AM  Result Value Ref Range   Sodium 142 135 - 145 mmol/L   Potassium 3.2 (L) 3.5 - 5.1 mmol/L   Chloride 105 98 - 111 mmol/L   CO2 27 22 - 32 mmol/L   Glucose, Bld 85 70 - 99 mg/dL    Comment: Glucose reference range applies only to samples taken after fasting for at least 8 hours.   BUN 11 8 - 23 mg/dL   Creatinine, Ser 4.85 (L) 0.61 - 1.24 mg/dL   Calcium 8.2 (L) 8.9 - 10.3 mg/dL   Total Protein 5.7 (L) 6.5 - 8.1 g/dL   Albumin 1.9 (L) 3.5 - 5.0 g/dL   AST 22 15 - 41 U/L   ALT 12 0 - 44 U/L   Alkaline Phosphatase 71 38 - 126 U/L   Total Bilirubin 0.9 0.3 - 1.2 mg/dL   GFR, Estimated >46 >27 mL/min    Comment: (NOTE) Calculated using the CKD-EPI Creatinine  Equation (2021)    Anion gap 10 5 - 15    Comment: Performed at [facility], [address], [place], [place] [address]  CBC     Status: Abnormal   Collection Time: 06/20/20  3:48 AM  Result Value Ref Range   WBC 11.0 (H) 4.0 - 10.5 K/uL   RBC 2.80 (L) 4.22 - 5.81 MIL/uL   Hemoglobin 8.2 (L) 13.0 - 17.0 g/dL   HCT 93.8 (L) 18.2 - 99.3 %   MCV 99.3 80.0 - 100.0 fL   MCH 29.3 26.0 - 34.0 pg   MCHC 29.5 (L) 30.0 - 36.0 g/dL   RDW 71.6 (H) 96.7 - 89.3 %   Platelets 198 150 - 400 K/uL   nRBC 0.0 0.0 - 0.2 %    Comment: Performed at [facility], [address], [place], [place] [address]  Protime-INR     Status: Abnormal   Collection Time: 06/20/20  3:48 AM  Result Value Ref Range   Prothrombin Time 16.7 (H) 11.4 - 15.2 seconds   INR 1.4 (H) 0.8 - 1.2    Comment: (NOTE) INR goal varies based on device and disease states. Performed at [facility], [address] [place], [place] [address]  Magnesium     Status: None   Collection Time: 06/20/20  3:48 AM  Result Value Ref Range   Magnesium 1.8 1.7 - 2.4 mg/dL    Comment: Performed at [facility], [address], [place], [place] [address]  C-reactive protein     Status: Abnormal   Collection Time: 06/20/20  3:48 AM  Result Value Ref Range   CRP 5.7 (H) <1.0 mg/dL    Comment: Performed at [facility], [address], [place], [place] [address]    CT ABDOMEN PELVIS WO CONTRAST  Result Date: 06/19/2020 CLINICAL DATA:  Abdominal pain, hematuria since Thursday. Suprapubic catheter in place. RIGHT lower quadrant abdominal pain. COVID positive. EXAM: CT ABDOMEN AND PELVIS WITHOUT CONTRAST TECHNIQUE: Multidetector CT imaging of the abdomen and pelvis was performed following the standard protocol without IV contrast. COMPARISON:  Chest CT dated 05/03/2020. FINDINGS: Lower chest: Patchy small consolidations at the bilateral lung bases, RIGHT  greater than LEFT. RIGHT pleural effusion, small to moderate in size. Hepatobiliary: No focal liver abnormality is seen. 1.1 cm gallstone. Gallbladder appears otherwise unremarkable. No bile duct dilatation is seen. Pancreas: Diffusely infiltrated with fat but otherwise unremarkable. No peripancreatic fluid. Spleen: Normal in size without focal abnormality. Adrenals/Urinary Tract: Adrenal glands are unremarkable. 2 LEFT renal stones, each measuring 2-3 mm, nonobstructive. Single 3 mm nonobstructing RIGHT renal stone. No hydronephrosis is seen bilaterally. No ureteral or bladder calculi are identified. Bladder is decompressed by Foley catheter. Contents of the bladder are hyperdense, presumably blood products. Stomach/Bowel: No dilated large or small bowel loops. Diverticulosis of the sigmoid and descending colon but no focal inflammatory changes seen to suggest acute diverticulitis. Moderate amount of stool and gas in the ascending and transverse colon. Stomach is unremarkable, partially decompressed. Vascular/Lymphatic: Aortic atherosclerosis. No enlarged lymph nodes are seen within the abdomen or pelvis. Reproductive: Prostate is unremarkable. Other: No free fluid or abscess collection is seen. No free intraperitoneal air. Musculoskeletal: No acute or suspicious osseous finding. Degenerative spondylosis at the L1-2 and L2-3 levels, moderate in degree with associated disc space narrowing and osseous spurring. Visualized soft tissues of the abdominal wall are unremarkable. Gas-like collection overlying the coccyx and lower sacrum. IMPRESSION: 1. Patchy small consolidations at the bilateral lung bases, RIGHT greater than LEFT, suggesting COVID-19 pneumonia. RIGHT pleural effusion, small to moderate in size. 2. Bladder is decompressed by Foley catheter. Contents of the bladder are hyperdense, presumably blood products. 3. Bilateral nephrolithiasis (largest stone measuring 3 mm). No ureteral or bladder calculi. No  hydronephrosis. 4. Colonic diverticulosis without evidence of acute diverticulitis. 5. Cholelithiasis without evidence of acute cholecystitis. 6. Ill-defined collection of air overlying the coccyx and lower sacrum, possibly superficial to the patient but concerning for sequela of sacral decubitus ulcer or other infectious process of the soft tissues overlying the sacrum/coccyx. Clinical correlation recommended. Aortic Atherosclerosis (ICD10-I70.0). Electronically Signed   By: Bary Richard M.D.   On: 06/19/2020 15:49   DG Chest Port 1 View  Result Date: 06/19/2020 CLINICAL DATA:  cough EXAM: PORTABLE CHEST 1 VIEW COMPARISON:  06/06/2020 and prior. FINDINGS: Decreased bibasilar opacities and small pleural effusions. No pneumothorax. Cardiomediastinal silhouette within normal limits. Left shoulder osteoarthrosis. IMPRESSION: Decreased bibasilar opacities and small pleural effusions. Electronically Signed   By: Stana Bunting M.D.   On: 06/19/2020 14:40    Review of Systems Blood pressure 104/63, pulse (!) 116, temperature 98.6 F (37 C), temperature source Oral, resp. rate 20, height 6' (1.829 m), weight  106 kg, SpO2 97 %. Physical Exam  Assessment/Plan:   1 - Gross Hematuria - gross hematuria most likely prostate source in setting of anticoagulation and indwelling foley. Rec begin finasteride and continue going forward (decrease prostate vascularity)  2 - Quadriplegic Bladder Management - Rec DC foley and begin condom cath. As his neurologic lesion is Supra-Sacral he will likely have intact reflex voiding which is a safe physiology and avoids hematuria / penile trauma of foley and wound problems of diapers.     Sebastian Ache 06/20/2020, 3:40 PM

## 2020-06-21 DIAGNOSIS — G959 Disease of spinal cord, unspecified: Secondary | ICD-10-CM | POA: Diagnosis not present

## 2020-06-21 DIAGNOSIS — N390 Urinary tract infection, site not specified: Secondary | ICD-10-CM | POA: Diagnosis not present

## 2020-06-21 DIAGNOSIS — Z978 Presence of other specified devices: Secondary | ICD-10-CM | POA: Diagnosis not present

## 2020-06-21 DIAGNOSIS — R319 Hematuria, unspecified: Secondary | ICD-10-CM | POA: Diagnosis not present

## 2020-06-21 LAB — CBC
HCT: 26.3 % — ABNORMAL LOW (ref 39.0–52.0)
Hemoglobin: 7.9 g/dL — ABNORMAL LOW (ref 13.0–17.0)
MCH: 29.4 pg (ref 26.0–34.0)
MCHC: 30 g/dL (ref 30.0–36.0)
MCV: 97.8 fL (ref 80.0–100.0)
Platelets: 170 10*3/uL (ref 150–400)
RBC: 2.69 MIL/uL — ABNORMAL LOW (ref 4.22–5.81)
RDW: 15.5 % (ref 11.5–15.5)
WBC: 10.9 10*3/uL — ABNORMAL HIGH (ref 4.0–10.5)
nRBC: 0 % (ref 0.0–0.2)

## 2020-06-21 LAB — COMPREHENSIVE METABOLIC PANEL
ALT: 12 U/L (ref 0–44)
AST: 22 U/L (ref 15–41)
Albumin: 1.8 g/dL — ABNORMAL LOW (ref 3.5–5.0)
Alkaline Phosphatase: 63 U/L (ref 38–126)
Anion gap: 9 (ref 5–15)
BUN: 12 mg/dL (ref 8–23)
CO2: 25 mmol/L (ref 22–32)
Calcium: 8 mg/dL — ABNORMAL LOW (ref 8.9–10.3)
Chloride: 105 mmol/L (ref 98–111)
Creatinine, Ser: 0.53 mg/dL — ABNORMAL LOW (ref 0.61–1.24)
GFR, Estimated: >60 mL/min (ref >60)
Glucose, Bld: 157 mg/dL — ABNORMAL HIGH (ref 70–99)
Potassium: 3.3 mmol/L — ABNORMAL LOW (ref 3.5–5.1)
Sodium: 139 mmol/L (ref 135–145)
Total Bilirubin: 0.4 mg/dL (ref 0.3–1.2)
Total Protein: 5.5 g/dL — ABNORMAL LOW (ref 6.5–8.1)

## 2020-06-21 LAB — URINE CULTURE: Culture: >=100000 — AB

## 2020-06-21 LAB — C-REACTIVE PROTEIN: CRP: 4.5 mg/dL — ABNORMAL HIGH (ref <1.0)

## 2020-06-21 MED ORDER — JUVEN PO PACK
1.0000 | PACK | Freq: Two times a day (BID) | ORAL | Status: DC
  Administered 2020-06-21 – 2020-06-26 (×7): 1 via ORAL
  Filled 2020-06-21 (×12): qty 1

## 2020-06-21 MED ORDER — ADULT MULTIVITAMIN W/MINERALS CH
1.0000 | ORAL_TABLET | Freq: Every day | ORAL | Status: DC
  Administered 2020-06-21 – 2020-06-26 (×6): 1 via ORAL
  Filled 2020-06-21 (×6): qty 1

## 2020-06-21 MED ORDER — ENSURE SURGERY PO LIQD
237.0000 mL | Freq: Three times a day (TID) | ORAL | Status: DC
  Administered 2020-06-21 – 2020-06-26 (×8): 237 mL via ORAL
  Filled 2020-06-21 (×7): qty 237

## 2020-06-21 MED ORDER — SODIUM CHLORIDE 0.9 % IV SOLN
1.0000 g | Freq: Three times a day (TID) | INTRAVENOUS | Status: AC
  Administered 2020-06-21 – 2020-06-26 (×15): 1 g via INTRAVENOUS
  Filled 2020-06-21 (×2): qty 0.17
  Filled 2020-06-21: qty 1
  Filled 2020-06-21: qty 0.17
  Filled 2020-06-21 (×2): qty 1
  Filled 2020-06-21 (×7): qty 0.17
  Filled 2020-06-21: qty 1
  Filled 2020-06-21: qty 0.17

## 2020-06-21 NOTE — Progress Notes (Addendum)
PROGRESS NOTE    Shannon [name]  ZDG:[id number] DOB: 06/08/1946 DOA: 06/19/2020 PCP: [name], MD   Brief Narrative:  Shannon [name] is a 74 y.o. 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  Subsequently sustained a fall resulting in epidural hematoma, underwent reexploration and removal of hardware and evacuation of hematoma on October 30.  Has been on a cervical collar since then and with plans to keep it on until mid January.  Patient also with history of BPH, chronic indwelling Foley catheter, hematuria.  He had a prolonged hospital stay last month and was actually discharged just a week ago to short-term rehab at a skilled nursing facility [facility]).  During his previous admission he had septic shock due to UTI and was also noted to have aspiration pneumonia.  He was also found to be incidentally positive for COVID-19 at that time and received monoclonal antibody treatment.  He was sent from the skilled nursing facility to the ER initially on December 24 for problems with his Foley catheter.  The Foley was replaced at that time and he was sent back to the skilled nursing facility.  He was sent back today to the ER due to hematuria.  Patient was also complaining of lower abdominal pain.  He was also noted to have a cough. CT scan of the abdomen pelvis showed nonobstructing renal stones.  No concerning findings noted in the bladder.  However patient was noted to have ill-defined collection of air overlying the coccyx in the lower sacrum.  He was given broad-spectrum antibiotics and will be hospitalized for further management.  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 Walden Field with urology contacted - indicates occasional hematuria is not uncommon with indwelling Foley, will evaluate for other secondary causes, we appreciate their insight and recommendations -Foley currently removed as holiday, poor urine output - 400cc bladder scan this afternoon - catheter to be replaced by urology team -Urine culture positive for achromobacter as above - transition to meropenem for coverage and DC flagyl/cefepime as below given broadened coverage with carbapenem. - No issues with obstruction, urinary retention or mechanical issues with Foley per patient - Multiple episodes of hematuria over the past few months resolving with bladder irrigation - No structural lesions identified on CT scan  Sacral wound with likely superficial infection, POA:  - Sx following, appreciate insight and recommendations- likely debridement in the next 24-48h - Transition to meropenem given sensitivities of UTI - this will cover abdomen/skin as well; DC flagyl cefepime.  COVID-19 infection, questionable, without hypoxia, POA:  - Patient was incidentally found to be positive for COVID-19 before he was discharged to skilled nursing facility - Positive December 14.  - S/P monoclonal antibodies at that time.   - Continue precautions given quarantine guidelines - Complaining of mild cough - higher risk for aspiration rather than secondary to COVID-19 at this point given his history - He is not hypoxic  History of dysphagia and recent aspiration pneumonia:  - Most likely  due to his cervical spine issues.  He has a cervical collar.  Will request speech therapy to see him.  Keep him n.p.o. for now.  Quadriplegia secondary to cervical myelopathy:  He underwent C-spine surgery on 10/13.   Subsequently sustained a fall resulting in epidural hematoma requiring surgical evacuation of the hematoma and removal of hardware.  He has a cervical collar in place.  Followed by Dr. Lovell Sheehan with neurosurgery.  Continue with cervical collar for now.  Plan is to have it removed by Dr. Lovell Sheehan in mid January.  Bilateral nephrolithiasis: Noted on CT scan.  No evidence for obstruction.  Incidental cholelithiasis: Noted on CT scan.  No evidence for acute cholecystitis.  LFTs are normal.  Chronic hypotension: Continue with midodrine.  Seems to be stable currently.  Anemia of chronic disease/vitamin B12 deficiency/acute blood loss anemia: Hemoglobin stable today compared to previous values.  Monitor daily for now.  History of BPH: Continue Flomax.  Severe protein calorie malnutrition: Nutritionist to see.  Goals of care: Patient with steady decline over the last few months.  Seen by palliative care during his previous hospitalization.  Patient and family wanted to continue with full scope of care.  We will reinvolve them during this admission as well.  DVT prophylaxis: SCDs Code Status: Full - continue to discuss goals of care as above Family Communication: Discussed case with patient's daughter [name]; attempted to call [name] (niece) with no answer  Status is: Inpt  Dispo: The patient is from: SNF              Anticipated d/c is to: SNF              Anticipated d/c date is: 72 to 96 hours              Patient currently not medically stable for discharge given ongoing need for surgical procedure, antibiotics and further evaluation with urology.  Consultants:   Urology, general surgery  Procedures:   Pending sacral debridement 12/29  Antimicrobials:  Cefepime, Flagyl 12/28 - discontinued 12/29 Vancomycin 12/28 -  ongoing Meropenem initiated 12/29 - ongoing  Subjective: No acute issues or events overnight denies nausea, vomiting, diarrhea, constipation, headache, fevers,  chills.  Objective: Vitals:   06/20/20 2217 06/20/20 2330 06/21/20 0327 06/21/20 0725  BP: (!) 83/57 99/62 114/66 106/74  Pulse: (!) 117 (!) 116 (!) 108 91  Resp: 20 20 (!) 22 19  Temp: 99 F (37.2 C) 98.9 F (37.2 C) 98.7 F (37.1 C) 98.4 F (36.9 C)  TempSrc: Oral Axillary Oral   SpO2: 100% 96% 97% 97%  Weight:      Height:        Intake/Output Summary (Last 24 hours) at 06/21/2020 0810 Last data filed at 06/21/2020 0400 Gross per 24 hour  Intake 2283.07 ml  Output 350 ml  Net 1933.07 ml   Filed Weights   06/19/20 1126  Weight: 106 kg    Examination:  General:  Pleasantly resting in bed, No acute distress. HEENT:  Normocephalic atraumatic.  Sclerae nonicteric, noninjected.  Extraocular movements intact bilaterally. Neck: Postsurgical bandages clean dry intact, c-collar in place Lungs:  Clear to auscultate bilaterally without rhonchi, wheeze, or rales. Heart:  Regular rate and rhythm.  Without murmurs, rubs, or gallops. Abdomen/GU:  Soft, nontender, nondistended.  Without guarding or rebound.  Foley draining blood-tinged urine Extremities: Without cyanosis, clubbing, edema, or obvious deformity. Vascular:  Dorsalis pedis and posterior tibial pulses palpable bilaterally. Skin:  Warm and dry,  no erythema, no ulcerations.    Data Reviewed: I have personally reviewed following labs and imaging studies  CBC: Recent Labs  Lab 06/19/20 1312 06/20/20 0348 06/21/20 0337  WBC 12.5* 11.0* 10.9*  NEUTROABS 9.5*  --   --   HGB 9.0* 8.2* 7.9*  HCT 29.3* 27.8* 26.3*  MCV 97.0 99.3 97.8  PLT 237 198 170   Basic Metabolic Panel: Recent Labs  Lab 06/19/20 1312 06/20/20 0348 06/21/20 0337  NA 141 142 139  K 3.3* 3.2* 3.3*  CL 102 105 105  CO2 GLUCOSE 115* 85 157*  BUN CREATININE 0.57* 0.47* 0.53*  CALCIUM 8.3* 8.2* 8.0*  MG  --  1.8  --    GFR: Estimated Creatinine Clearance: 102 mL/min (A) (by C-G formula based on SCr of 0.53 mg/dL  (L)). Liver Function Tests: Recent Labs  Lab 06/19/20 1312 06/20/20 0348 06/21/20 0337  AST ALT ALKPHOS 86 71 63  BILITOT 0.9 0.9 0.4  PROT 6.2* 5.7* 5.5*  ALBUMIN 2.0* 1.9* 1.8*   No results for input(s): LIPASE, AMYLASE in the last 168 hours. No results for input(s): AMMONIA in the last 168 hours. Coagulation Profile: Recent Labs  Lab 06/19/20 1312 06/20/20 0348  INR 1.3* 1.4*   Cardiac Enzymes: No results for input(s): CKTOTAL, CKMB, CKMBINDEX, TROPONINI in the last 168 hours. BNP (last 3 results) No results for input(s): PROBNP in the last 8760 hours. HbA1C: No results for input(s): HGBA1C in the last 72 hours. CBG: No results for input(s): GLUCAP in the last 168 hours. Lipid Profile: No results for input(s): CHOL, HDL, LDLCALC, TRIG, CHOLHDL, LDLDIRECT in the last 72 hours. Thyroid Function Tests: No results for input(s): TSH, T4TOTAL, FREET4, T3FREE, THYROIDAB in the last 72 hours. Anemia Panel: No results for input(s): VITAMINB12, FOLATE, FERRITIN, TIBC, IRON, RETICCTPCT in the last 72 hours. Sepsis Labs: No results for input(s): PROCALCITON, LATICACIDVEN in the last 168 hours.  Recent Results (from the past 240 hour(s))  Urine culture     Status: None   Collection Time: 06/16/20  4:00 AM   Specimen: Urine, Random  Result Value Ref Range Status   Specimen Description   Final    URINE, RANDOM Performed at [facility], [address], [place], [place] [address]    Special Requests   Final    NONE Performed at [facility], [address], [place], [place] [address]    Culture   Final    NO GROWTH Performed at [facility], [address] [place], [place] [address]    Report Status 06/17/2020 FINAL  Final  Urine culture     Status: None (Preliminary result)   Collection Time: 06/19/20  2:14 PM   Specimen: Urine, Random  Result Value Ref Range Status   Specimen Description   Final     URINE, RANDOM Performed at [facility], [address], [place], [place] [address]    Special Requests   Final    NONE Performed at [facility], [address] [place], [place] [address]    Culture   Final    CULTURE REINCUBATED FOR BETTER GROWTH Performed at [facility], [address], [place], [place] [address]    Report Status PENDING  Incomplete     Radiology Studies: CT ABDOMEN PELVIS WO CONTRAST  Result Date: 06/19/2020 CLINICAL DATA:  Abdominal pain, hematuria since Thursday. Suprapubic catheter in place. RIGHT lower quadrant abdominal pain. COVID positive. EXAM: CT ABDOMEN AND PELVIS WITHOUT CONTRAST TECHNIQUE: Multidetector CT imaging of the abdomen and pelvis was performed following the standard protocol without IV contrast. COMPARISON:  Chest CT dated 05/03/2020. FINDINGS: Lower chest: Patchy small consolidations at the bilateral lung bases, RIGHT greater than LEFT. RIGHT pleural effusion, small to moderate in size. Hepatobiliary: No focal liver abnormality is seen. 1.1 cm gallstone. Gallbladder appears otherwise unremarkable. No bile duct dilatation is seen. Pancreas: Diffusely infiltrated with fat but otherwise unremarkable. No peripancreatic fluid. Spleen: Normal in size without focal abnormality. Adrenals/Urinary Tract: Adrenal glands are unremarkable. 2 LEFT renal stones, each measuring 2-3 mm, nonobstructive. Single 3 mm nonobstructing RIGHT renal stone. No hydronephrosis is seen bilaterally. No ureteral or bladder calculi are identified. Bladder is decompressed by Foley catheter. Contents of the bladder are hyperdense, presumably blood products. Stomach/Bowel: No dilated large or small bowel loops. Diverticulosis of the sigmoid and descending colon but no focal inflammatory changes seen to suggest acute diverticulitis. Moderate amount of stool and gas in the ascending and transverse colon. Stomach is unremarkable, partially  decompressed. Vascular/Lymphatic: Aortic atherosclerosis. No enlarged lymph nodes are seen within the abdomen or pelvis. Reproductive: Prostate is unremarkable. Other: No free fluid or abscess collection is seen. No free intraperitoneal air. Musculoskeletal: No acute or suspicious osseous finding. Degenerative spondylosis at the L1-2 and L2-3 levels, moderate in degree with associated disc space narrowing and osseous spurring. Visualized soft tissues of the abdominal wall are unremarkable. Gas-like collection overlying the coccyx and lower sacrum. IMPRESSION: 1. Patchy small consolidations at the bilateral lung bases, RIGHT greater than LEFT, suggesting COVID-19 pneumonia. RIGHT pleural effusion, small to moderate in size. 2. Bladder is decompressed by Foley catheter. Contents of the bladder are hyperdense, presumably blood products. 3. Bilateral nephrolithiasis (largest stone measuring 3 mm). No ureteral or bladder calculi. No hydronephrosis. 4. Colonic diverticulosis without evidence of acute diverticulitis. 5. Cholelithiasis without evidence of acute cholecystitis. 6. Ill-defined collection of air overlying the coccyx and lower sacrum, possibly superficial to the patient but concerning for sequela of sacral decubitus ulcer or other infectious process of the soft tissues overlying the sacrum/coccyx. Clinical correlation recommended. Aortic Atherosclerosis (ICD10-I70.0). Electronically Signed   By: Bary RichardStan  Maynard M.D.   On: 06/19/2020 15:49   DG Chest Port 1 View  Result Date: 06/19/2020 CLINICAL DATA:  cough EXAM: PORTABLE CHEST 1 VIEW COMPARISON:  06/06/2020 and prior. FINDINGS: Decreased bibasilar opacities and small pleural effusions. No pneumothorax. Cardiomediastinal silhouette within normal limits. Left shoulder osteoarthrosis. IMPRESSION: Decreased bibasilar opacities and small pleural effusions. Electronically Signed   By: Stana Buntinghikanele  Emekauwa M.D.   On: 06/19/2020 14:40    Scheduled Meds: .  Chlorhexidine Gluconate Cloth  6 each Topical Daily  . docusate sodium  100 mg Oral BID  . feeding supplement  237 mL Oral BID BM  . ferrous sulfate  325 mg Oral BID WC  . mouth rinse  15 mL Mouth Rinse BID  . midodrine  10 mg Oral BID WC  . pantoprazole  40 mg Oral QHS  . polyethylene glycol  17 g Oral BID  . tamsulosin  0.4 mg Oral Daily   Continuous Infusions: . ceFEPime (MAXIPIME) IV 2 g (06/21/20 0033)  . lactated ringers 50 mL/hr at 06/21/20 0130  . metronidazole 500 mg (06/21/20 0214)  . vancomycin Stopped (06/20/20 2203)     LOS: 2 days   Time spent: 40min  Azucena Fallen, DO [facility]  If 7PM-7AM, please contact night-coverage [email or web address]  06/21/2020, 8:10 AM

## 2020-06-21 NOTE — Progress Notes (Addendum)
Initial Nutrition Assessment  INTERVENTION:   -Ensure Surgery TID, each provides 330 kcals and 18g protein  -Juven Fruit Punch BID, each serving provides 95kcal and 2.5g of protein (amino acids glutamine and arginine)  -Multivitamin with minerals daily  NUTRITION DIAGNOSIS:   Increased nutrient needs related to wound healing as evidenced by estimated needs.  GOAL:   Patient will meet greater than or equal to 90% of their needs  MONITOR:   PO intake,Supplement acceptance,Labs,Weight trends,I & O's,Skin  REASON FOR ASSESSMENT:   Malnutrition Screening Tool    ASSESSMENT:   74 y.o. 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  Subsequently sustained a fall resulting in epidural hematoma, underwent reexploration and removal of hardware and evacuation of hematoma on October 30.  Has been on a cervical collar since then and with plans to keep it on until mid January.  COVID-19+ since 12/14.  Patient with history of severe malnutrition and prolonged poor PO intake. Currently no PO documented for this admission.  Pt has been ordered Ensure supplements and pt is accepting at this time. Will increase these and add Juven supplements given worsening pressure injuries.  Pt with cervical collar.   Per Palliative care note, pt wants to try all aggressive measures. Surgery planning debridement tomorrow of sacral wound. The wound is not expected to heal.   Per weight records, pt's weight has increased.  On 11/10 pt weighed 186 lbs.  Current weight: 233 lbs. Will monitor weight trends.   Per nursing documentation, non-pitting edema present in all extremities.  Medications: Colace, Ferrous sulfate, Miralax, Lactated ringers  Labs reviewed; Low K  NUTRITION - FOCUSED PHYSICAL EXAM:  Unable to complete  Diet Order:   Diet Order            Diet NPO time specified  Diet effective midnight            Diet regular Room service appropriate? Yes; Fluid consistency: Thin  Diet effective now                 EDUCATION NEEDS:   No education needs have been identified at this time  Skin:  Skin Assessment: Skin Integrity Issues: Skin Integrity Issues:: Unstageable,DTI,Stage II,Stage III DTI: left throat Stage II: vertebral column Stage III: right throat Unstageable: sacrum  Last BM:  12/28 -type 4  Height:   Ht Readings from Last 1 Encounters:  06/19/20 6' (1.829 m)    Weight:   Wt Readings from Last 1 Encounters:  06/19/20 106 kg   BMI:  Body mass index is 31.69 kg/m.  Estimated Nutritional Needs:   Kcal:  2200-2400  Protein:  110-130g  Fluid:  2L/day  Tilda Franco, MS, RD, LDN Inpatient Clinical Dietitian Contact information available via Amion

## 2020-06-21 NOTE — Evaluation (Signed)
Clinical/Bedside Swallow Evaluation Patient Details  Name: Shannon [name] MRN: [id number] Date of Birth: 01-10-1946  Today's Date: 06/21/2020 Time:        Past Medical History:  Past Medical History:  Diagnosis Date  . Asthma   . 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 Newell Coral, 1992-->surg at [facility].  Summer 2021->Cervicalgia,arm pain, hand atrophy RUE wkness, hyperreflex (Dr. 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 Hay fever   . Hypertension   . Osteoarthritis of both hands    Past Surgical History:  Past Surgical History:  Procedure Laterality Date  . ANTERIOR CERVICAL DECOMP/DISCECTOMY FUSION N/A 04/05/2020   Procedure: ANTERIOR CERVICAL DECOMPRESSION/DISCECTOMY FUSION, INTERBODY PROSTHESIS, PLATE/SCREWS CERVICAL THREE-CERVICAL FOUR, CERVICAL FOUR- CERVICAL FIVE;  Surgeon: Tressie Stalker, MD;  Location: [facility] OR;  Service: Neurosurgery;  Laterality: N/A;  . ANTERIOR CERVICAL DECOMP/DISCECTOMY FUSION N/A 04/22/2020   Procedure: Reexploration of anterior cervical wound for epidural hematoma;  Surgeon: Donalee Citrin, MD;  Location: [facility] OR;  Service: Neurosurgery;  Laterality: N/A;  . BACK SURGERY    . INCISION AND DRAINAGE Left 2012   L hand infection  . ROTATOR CUFF REPAIR Right    x 3  . SPINE SURGERY     HPI:  Shannon [name] is a 74 y.o. 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  He originally had a C3-4 [place] 4-5 anterior cervical disc ACDF with decompression, C3 3-4 and C4-5 interbody arthrodesis with local autograft bone, anterior cervical plating of C3-C5 on 04/05/2020.  He then went home and had a fall from his bed, he was then hospitalized and from here he was sent to a nursing home, he has been quadriplegic since then, he has also  developed sacral decubitus ulcers. Was seen for MBS on 05/04/20 "deficits resulted in silent aspiration of thin liquid and nectar thick liquid by straw prior to the swallow. With nectar thick liquid by cup, only transient penetration was seen. There was significant pharyngeal residue with puree and solid consistencies which was reduced but not fully cleared with liquid wash." Pt was then admitted for AMS due to UTI, sepsis and hypotension and underwent mbs with normal swallow. Pt now admitted with hematuria - Also diagnosed with COVID 06/11/2020.  MD ordered swallow eval currently due to"suspicion for aspiration pna".  Pt has remained on precautions due to his continued cough. Pt reports he only coughs when he is being moved around.   Assessment / Plan / Recommendation Clinical Impression  No focal CN deficits present and pt's cognition appers intact.  Pt declined to attempt solids stating his lunch tray (sitting in his room) was making him sick.  He did however willingly consume soda and Ensure mixed with strawberry icecream as SLP was concerned for nutrition given plan for surgery and poor po intake.  No indications of aspiration/penetration. Positioning is challenging for pt due to his quadriparesis. Recommend to set up oral suction for emergent use given his immobility. He has a strong voltiional cough fortunately. Due to missing some dentition, recommend softer foods - RN advised he can choose softer foods on his diet - prior COVID pts only received house tray per SLP understanding.  Medicine with icecream advised.  No follow up as pt educated to importance of swallow precautions and managing diet. SLP Visit Diagnosis:  Dysphagia, unspecified (R13.10)    Aspiration Risk  Mild aspiration risk;Risk for inadequate nutrition/hydration    Diet Recommendation Thin liquid;Regular (order SOFT foods)   Medication Administration: Whole meds with puree (whole with icecream) Compensations: Slow rate;Small  sips/bites Postural Changes: Seated upright at 90 degrees (upright for po and remain upright as able)    Other  Recommendations Oral Care Recommendations: Oral care BID (all pt to swish and expectorate water prn) BRUSH TEETH BID   Follow up Recommendations   n/a     Frequency and Duration     n/a       Prognosis   n/a     Swallow Study   General HPI: Shannon [name] is a 74 y.o. 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  He originally had a C3-4 [place] 4-5 anterior cervical disc ACDF with decompression, C3 3-4 and C4-5 interbody arthrodesis with local autograft bone, anterior cervical plating of C3-C5 on 04/05/2020.  He then went home and had a fall from his bed, he was then hospitalized and from here he was sent to a nursing home, he has been quadriplegic since then, he has also developed sacral decubitus ulcers. Was seen for MBS on 05/04/20 "deficits resulted in silent aspiration of thin liquid and nectar thick liquid by straw prior to the swallow. With nectar thick liquid by cup, only transient penetration was seen. There was significant pharyngeal residue with puree and solid consistencies which was reduced but not fully cleared with liquid wash." Pt was then admitted for AMS due to UTI, sepsis and hypotension and underwent mbs with normal swallow. Pt now admitted with hematuria - Also diagnosed with COVID 06/11/2020.  MD ordered swallow eval currently due to"suspicion for aspiration pna".  Pt has remained on precautions due to his continued cough. Pt reports he only coughs when he is being moved around. Previous Swallow Assessment: MBS 11/11, mbs 2 weeks ago Diet Prior to this Study: Regular;Thin liquids Temperature Spikes Noted: No Respiratory Status: Room air History of Recent Intubation: No Behavior/Cognition: Alert;Requires cueing;Cooperative Oral Cavity Assessment: Within Functional  Limits Oral Care Completed by SLP: No Oral Cavity - Dentition: Adequate natural dentition (few missing dentition) Vision: Impaired for self-feeding Self-Feeding Abilities: Total assist Patient Positioning: Partially reclined Baseline Vocal Quality: Normal Volitional Cough: Strong    Oral/Motor/Sensory Function Overall Oral Motor/Sensory Function: Within functional limits   Ice Chips Ice chips: Not tested   Thin Liquid Thin Liquid: Within functional limits Presentation: Straw    Nectar Thick Nectar Thick Liquid: Within functional limits Presentation: Straw   Honey Thick Honey Thick Liquid: Not tested   Puree Puree: Not tested   Solid     Solid: Not tested      Shannon [name] 06/21/2020,2:00 PM   Shannon Infante, MS [facility] SLP Acute Rehab Services Office [phone] Pager [phone]

## 2020-06-21 NOTE — Progress Notes (Signed)
   06/20/20 2330  Assess: MEWS Score  Temp 98.9 F (37.2 C)  BP 99/62  Pulse Rate (!) 116  Resp 20  Level of Consciousness Alert  SpO2 96 %  O2 Device Room Air  Patient Activity (if Appropriate) In bed  O2 Flow Rate (L/min) 0 L/min  Assess: MEWS Score  MEWS Temp 0  MEWS Systolic 1  MEWS Pulse 2  MEWS RR 0  MEWS LOC 0  MEWS Score 3  MEWS Score Color Yellow  Assess: if the MEWS score is Yellow or Red  Were vital signs taken at a resting state? Yes  Focused Assessment No change from prior assessment  Early Detection of Sepsis Score *See Row Information* High  MEWS guidelines implemented *See Row Information* No, previously yellow, continue vital signs every 4 hours  Treat  MEWS Interventions Escalated (See documentation below)  Pain Scale 0-10  Pain Score 0  Escalate  MEWS: Escalate Yellow: discuss with charge nurse/RN and consider discussing with provider and RRT  Notify: Provider  Provider Name/Title Cherylin Mylar  Date Provider Notified 06/20/20  Time Provider Notified 2339  Notification Type Page  Notification Reason Other (Comment) (order clrification and vitals)  Response See new orders  Date of Provider Response 06/21/20  Time of Provider Response 2357  Document  Patient Outcome Stabilized after interventions  Progress note created (see row info) Yes

## 2020-06-21 NOTE — Progress Notes (Signed)
Palliative discussions noted. Discussed plans for operative debridement of sacral wound tomorrow with patient who is in agreement. He understands that wound will likely not heal. He understands risks including bleeding, infection, poor wound healing, complications of anesthesia and death. He wishes to proceed. He asked that I speak with his daughter and niece today, I will call them either together or separately this afternoon.   NPO after MN. Already on abx. Blood thinner has been held. Order placed for informed consent.   Juliet Rude, [facility] 06/21/2020, 10:40 AM Please see Amion for pager number during day hours 7:00am-4:30pm

## 2020-06-21 NOTE — Progress Notes (Signed)
SlP eval completed, full report to follow.  Pt declined to attempt solids stating his lunch tray was making him sick.  He did however willingly consume soda and Ensure mixed with strawberry icecream as SLP was concerned for nutrition given plan for surgery and poor po intake.  No indications of aspiration/penetration. Positioning is challenging for pt due to his quadriparesis. Recommend to set up oral suction for emergent use given his immobility. He has a strong voltiional cough fortunately. Due to missing some dentition, recommend softer foods - RN advised he can choose softer foods on his diet - prior COVID pts only received house tray per SLP understanding.  Medicine with icecream advised.  No follow up as pt educated and managing diet.    Rolena Infante, MS [facility] SLP Acute Rehab Services Office [phone] Pager [phone]

## 2020-06-21 NOTE — Progress Notes (Signed)
Patient had chronic catheter removed last night and condom catheter placed. No urine output noted since condom catheter was placed. Bladder Scan done and 471cc noted. MD notified and 77fr coude catheter inserted and 350 brown urine noted in drainage bag.

## 2020-06-21 NOTE — Consult Note (Signed)
Consultation Note Date: 06/21/2020   Patient Name: Shannon [name]  DOB: [birth year]  MRN: [id number]  Age / Sex: 74 y.o., male  PCP: [name], MD Referring Physician: Azucena Fallen, MD  Reason for Consultation: Establishing goals of care  HPI/Patient Profile: 74 y.o. 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  He has history of dysphagia and recent aspiration pneumonia.  He was recently seen by palliative care team at the beginning of the month and plan at that point in time was for continuation of aggressive interventions.  He was evaluated by surgery this admission and consideration for surgical debridement of sacral area, however, it is thought prudent to revisit goals of care prior to any surgical intervention.  Palliative consulted for goals of care.  Clinical Assessment and Goals of Care: I saw and examined Shannon [name] today.  He was pleasant and cooperative and lying in bed in no distress.  He is continues to wear cervical collar with plan for to stay in place through at least January.  We discussed clinical course since October and the challenges he has faced dealing with both the physical and emotional components of his current situation.  Discussed his wishes moving forward in regard to advanced directives and care plan in light of the fact that he has multiple comorbid problems that are not going to go away.  He endorses a strong faith that God is in control and he feels that his role is to continue to pursue aggressive care and if he declines in spite of this then that is God's will.   We specifically  spent time discussing his sacral wound and options for care moving forward.  We reviewed surgery evaluation and consideration for surgical debridement and the risks associated with this, including higher risk of complications and morbidity in light of his current Covid infection.  We also discussed that, even if he pursues aggressive care and debridement, this is not likely going to be a wound that heals.  In spite of the fact that healing is unlikely, he states, "it would be dumb" not to pursue aggressive interventions including debridement.  He also asked me to call and discuss with his daughter, Shannon [name].  Shannon [name] remembers talking with Shannon [name] from our team earlier this month.  We discussed her father's multiple comorbidities and that continuation of aggressive interventions may be adding time to his life, but we may not be adding to his quality of life.  She again expressed as she had with prior conversation with palliative care team that, " we need to try."   She then expressed desire to explore other options for facility placement at time of discharge.  I told her that I was unsure what options there were regarding this, but I would pass along concern to transition of  care team.  Questions and concerns addressed.   PMT will continue to support holistically.   SUMMARY OF RECOMMENDATIONS   -Full code/full scope treatment -Shannon [name] continues to endorse being invested in plan for any aggressive interventions that are offered to him.  He would like to pursue debridement of his wound if this is the recommendation of surgical team in light of his desire for continued aggressive care. -His daughter would like to know if there are other facility options for placement when he is discharged from the hospital.  I let her know that I would pass this request along to transition of care team. -His daughter requests call from surgical team to discuss plan prior to any surgical procedures being  performed.  Code Status/Advance Care Planning:  Full code  Palliative Prophylaxis:   Frequent Pain Assessment  Additional Recommendations (Limitations, Scope, Preferences):  Full Scope Treatment  Psycho-social/Spiritual:   Desire for further Chaplaincy support: Did not address today  Additional Recommendations: Caregiving  Support/Resources  Prognosis:   Guarded  Discharge Planning: Skilled facility      Primary Diagnoses: Present on Admission: . Hematuria . Acute lower UTI . Cervical myelopathy (HCC) . Protein-calorie malnutrition, severe . Sacral decubitus ulcer . COVID-19 virus infection 06/06/2020 . UTI (urinary tract infection)   I have reviewed the medical record, interviewed the patient and family, and examined the patient. The following aspects are pertinent.  Past Medical History:  Diagnosis Date  . Asthma   . 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 Newell Coral, 1992-->surg at [facility].  Summer 2021->Cervicalgia,arm pain, hand atrophy RUE wkness, hyperreflex (Dr. 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 Hay fever   . Hypertension   . Osteoarthritis of both hands    Social History   Socioeconomic History  . Marital status: Divorced    Spouse name: Not on file  . Number of children: Not on file  . Years of education: Not on file  . Highest education level: Not on file  Occupational History  . Not on file  Tobacco Use  . Smoking status: Former Games developer  . Smokeless tobacco: Never Used  Vaping Use  . Vaping Use: Never used  Substance and Sexual Activity  . Alcohol use: Not Currently  . Drug use: Never  . Sexual activity: Not Currently  Other Topics Concern  . Not on file  Social History Narrative   Divorced, one daughter.   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   Veteran: Electronics engineer.  Last went to [facility]  [facility].   Tob: former.   Alc: no   Social Determinants of Corporate investment banker Strain: Not on file  Food Insecurity: Not on file  Transportation Needs: Not on file  Physical Activity: Not on file  Stress: Not on file  Social Connections: Not on file   Family History  Problem Relation Age of Onset  . Heart attack Mother   . Heart disease Mother   . High blood pressure Mother   . Alcohol abuse Father   . Cancer Father   . Diabetes Father   . High blood pressure Father    Scheduled Meds: . Chlorhexidine Gluconate Cloth  6 each Topical Daily  . docusate sodium  100 mg Oral BID  . feeding supplement  237 mL Oral BID BM  . ferrous sulfate  325 mg  Oral BID WC  . mouth rinse  15 mL Mouth Rinse BID  . midodrine  10 mg Oral BID WC  . pantoprazole  40 mg Oral QHS  . polyethylene glycol  17 g Oral BID  . tamsulosin  0.4 mg Oral Daily   Continuous Infusions: . ceFEPime (MAXIPIME) IV 2 g (06/21/20 0033)  . lactated ringers 50 mL/hr at 06/21/20 0130  . metronidazole 500 mg (06/21/20 0214)  . vancomycin Stopped (06/20/20 2203)   PRN Meds:.acetaminophen **OR** acetaminophen, ondansetron **OR** ondansetron (ZOFRAN) IV, sodium phosphate Medications Prior to Admission:  Prior to Admission medications   Medication Sig Start Date End Date Taking? Authorizing Provider  acetaminophen (TYLENOL) 500 MG tablet Take 1,000 mg by mouth 3 (three) times daily.    Yes [provider]  albuterol (VENTOLIN HFA) 108 (90 Base) MCG/ACT inhaler Inhale 2 puffs into the lungs daily.   Yes [provider]  ascorbic acid (VITAMIN C) 500 MG tablet Take 1,000 mg by mouth 3 (three) times daily.   Yes [provider]  docusate sodium (COLACE) 100 MG capsule Take 1 capsule (100 mg total) by mouth 2 (two) times daily. 06/12/20  Yes Leroy Sea, MD  ferrous sulfate 325 (65 FE) MG tablet Take 1 tablet (325 mg total) by mouth 2 (two) times daily with a meal. 06/06/20  Yes Zannie Cove, MD  midodrine (PROAMATINE) 10 MG tablet Take 1 tablet (10 mg total) by mouth 2 (two) times daily with a meal. 06/06/20  Yes Zannie Cove, MD  oxyCODONE (OXY IR/ROXICODONE) 5 MG immediate release tablet Take 1 tablet (5 mg total) by mouth every 8 (eight) hours as needed for moderate pain or severe pain. 06/06/20  Yes Zannie Cove, MD  pantoprazole (PROTONIX) 40 MG tablet Take 1 tablet (40 mg total) by mouth at bedtime. 04/19/20  Yes Love, [name], PA-C  polyethylene glycol (MIRALAX / GLYCOLAX) 17 g packet Take 17 g by mouth every morning. Mix in 6 oz water and drink   Yes [provider]  scopolamine (TRANSDERM-SCOP) 1 MG/3DAYS Place 1 patch onto the skin every 3 (three) days.   Yes [provider]  tamsulosin (FLOMAX) 0.4 MG CAPS capsule Take 1 capsule (0.4 mg total) by mouth daily. 04/28/20  Yes Tressie Stalker, MD  aspirin 325 MG tablet Take 325 mg by mouth every morning.    [provider]  feeding supplement (ENSURE ENLIVE / ENSURE PLUS) LIQD Take 237 mLs by mouth 4 (four) times daily. 06/12/20   Leroy Sea, MD   Allergies  Allergen Reactions  . Iodine Swelling  . Penicillins Swelling    ** tolerates cephalosporins Facial swelling, itchy throat   Review of Systems  Constitutional: Positive for activity change and fatigue.  Neurological: Positive for weakness.  Psychiatric/Behavioral: Positive for sleep disturbance.    Physical Exam  General: Alert, awake, in no acute distress.  HEENT: C-collar in place  Heart: Regular rate and rhythm. No murmur appreciated. Lungs: Good air movement, clear Abdomen: Soft, nontender, nondistended, positive bowel sounds.  Ext: No significant edema Skin: Warm and dry  Vital Signs: BP 106/74 (BP Location: Right Arm)   Pulse 91   Temp 98.4 F (36.9 C)   Resp 19   Ht 6' (1.829 m)   Wt 106 kg   SpO2 97%   BMI 31.69 kg/m  Pain Scale: 0-10   Pain Score: Asleep   SpO2: SpO2: 97 % O2  Device:SpO2: 97 % O2 Flow Rate: .  O2 Flow Rate (L/min): 0 L/min  IO: Intake/output summary:   Intake/Output Summary (Last 24 hours) at 06/21/2020 0745 Last data filed at 06/21/2020 0400 Gross per 24 hour  Intake 2283.07 ml  Output 350 ml  Net 1933.07 ml    LBM: Last BM Date: 06/20/20 Baseline Weight: Weight: 106 kg Most recent weight: Weight: 106 kg     Palliative Assessment/Data:   Flowsheet Rows   Flowsheet Row Most Recent Value  Intake Tab   Referral Department Hospitalist  Unit at Time of Referral Med/Surg Unit  Palliative Care Primary Diagnosis Sepsis/Infectious Disease  Date Notified 06/19/20  Palliative Care Type Return patient Palliative Care  Reason for referral Clarify Goals of Care  Date of Admission 06/19/20  Date first seen by Palliative Care 06/20/20  # of days Palliative referral response time 1 Day(s)  # of days IP prior to Palliative referral 0  Clinical Assessment   Palliative Performance Scale Score 20%  Psychosocial & Spiritual Assessment   Palliative Care Outcomes   Patient/Family meeting held? Yes  Who was at the meeting? Patient, daughter via phone  Palliative Care Outcomes Clarified goals of care      Time In: 1800 Time Out: 1915 Time Total: 75 Greater than 50%  of this time was spent counseling and coordinating care related to the above assessment and plan.  Signed by: Romie MinusGene Zamariya Neal, MD   Please contact Palliative Medicine Team phone at [phone] for questions and concerns.  For individual provider: See Loretha StaplerAmion

## 2020-06-22 ENCOUNTER — Encounter (HOSPITAL_COMMUNITY)
Admission: EM | Disposition: A | Discharge status: Skilled Nursing Facility | Payer: Self-pay | Source: Home / Self Care | Attending: Internal Medicine

## 2020-06-22 ENCOUNTER — Encounter (HOSPITAL_COMMUNITY): Payer: Self-pay | Admitting: Registered Nurse

## 2020-06-22 DIAGNOSIS — N390 Urinary tract infection, site not specified: Secondary | ICD-10-CM | POA: Diagnosis not present

## 2020-06-22 DIAGNOSIS — Z978 Presence of other specified devices: Secondary | ICD-10-CM | POA: Diagnosis not present

## 2020-06-22 DIAGNOSIS — R319 Hematuria, unspecified: Secondary | ICD-10-CM | POA: Diagnosis not present

## 2020-06-22 DIAGNOSIS — G959 Disease of spinal cord, unspecified: Secondary | ICD-10-CM | POA: Diagnosis not present

## 2020-06-22 LAB — COMPREHENSIVE METABOLIC PANEL
ALT: 11 U/L (ref 0–44)
AST: 15 U/L (ref 15–41)
Albumin: 1.7 g/dL — ABNORMAL LOW (ref 3.5–5.0)
Alkaline Phosphatase: 58 U/L (ref 38–126)
Anion gap: 5 (ref 5–15)
BUN: 12 mg/dL (ref 8–23)
CO2: 28 mmol/L (ref 22–32)
Calcium: 7.9 mg/dL — ABNORMAL LOW (ref 8.9–10.3)
Chloride: 106 mmol/L (ref 98–111)
Creatinine, Ser: 0.49 mg/dL — ABNORMAL LOW (ref 0.61–1.24)
GFR, Estimated: >60 mL/min (ref >60)
Glucose, Bld: 112 mg/dL — ABNORMAL HIGH (ref 70–99)
Potassium: 3.4 mmol/L — ABNORMAL LOW (ref 3.5–5.1)
Sodium: 139 mmol/L (ref 135–145)
Total Bilirubin: 0.5 mg/dL (ref 0.3–1.2)
Total Protein: 5.3 g/dL — ABNORMAL LOW (ref 6.5–8.1)

## 2020-06-22 LAB — CBC
HCT: 26.6 % — ABNORMAL LOW (ref 39.0–52.0)
Hemoglobin: 8 g/dL — ABNORMAL LOW (ref 13.0–17.0)
MCH: 29.6 pg (ref 26.0–34.0)
MCHC: 30.1 g/dL (ref 30.0–36.0)
MCV: 98.5 fL (ref 80.0–100.0)
Platelets: 169 10*3/uL (ref 150–400)
RBC: 2.7 MIL/uL — ABNORMAL LOW (ref 4.22–5.81)
RDW: 15.3 % (ref 11.5–15.5)
WBC: 8.8 10*3/uL (ref 4.0–10.5)
nRBC: 0.2 % (ref 0.0–0.2)

## 2020-06-22 LAB — C-REACTIVE PROTEIN: CRP: 2.6 mg/dL — ABNORMAL HIGH (ref <1.0)

## 2020-06-22 SURGERY — IRRIGATION AND DEBRIDEMENT WOUND
Anesthesia: General

## 2020-06-22 NOTE — Progress Notes (Signed)
Pharmacy Antibiotic Note  Shannon [name] is a 74 y.o. male admitted on 06/19/2020 with sacral decubitis ulcer.  Pharmacy has been consulted for vancomycin dosing.  Plan: Vancomycin 1250mg  IV q12h  - estimated AUC 418 rounding SCr up 0.8 given quadriplegia may overestimate renal function Check levels as needed Meropenem 1g IV q8h per MD. Follow up renal function & cultures, duration of therapy.  Height: 6' (182.9 cm) Weight: 106 kg (233 lb 11 oz) IBW/kg (Calculated) : 77.6  Temp (24hrs), Avg:98.5 F (36.9 C), Min:98.1 F (36.7 C), Max:98.8 F (37.1 C)  Recent Labs  Lab 06/19/20 1312 06/20/20 0348 06/21/20 0337 06/22/20 0349  WBC 12.5* 11.0* 10.9* 8.8  CREATININE 0.57* 0.47* 0.53* 0.49*    Estimated Creatinine Clearance: 102 mL/min (A) (by C-G formula based on SCr of 0.49 mg/dL (L)).    Allergies  Allergen Reactions  . Iodine Swelling  . Penicillins Swelling    ** tolerates cephalosporins Facial swelling, itchy throat    Antimicrobials this admission: 12/27 Cefepime >> 12/29  12/27 Vancomycin >> 12/27 metronidazole >> 12/29  12/29 meropenem >>   Dose adjustments this admission:  Microbiology results: 12/27 BCx: NGF 12/27 UCx: Achromobacter xylosoxidans ( R to cefaz, cipri gent, S to imipenem, Septra)    Thank you for allowing pharmacy to be a part of this patient's care.  1/28 PharmD, BCPS Clinical Pharmacist [facility] main pharmacy [phone] 06/22/2020 11:46 AM

## 2020-06-22 NOTE — Progress Notes (Signed)
Per discussion with Dr. Berneda Rose coordinator, patient no longer needs to be on precautions. The patient was incidentally positive and only symptoms have been a chronic cough. Dr. Natale Milch states that this will not change and that patient will continue to have cough, but the cough is not covid related.

## 2020-06-22 NOTE — Plan of Care (Signed)
?  Problem: Coping: ?Goal: Level of anxiety will decrease ?Outcome: Progressing ?  ?Problem: Safety: ?Goal: Ability to remain free from injury will improve ?Outcome: Progressing ?  ?

## 2020-06-22 NOTE — Progress Notes (Addendum)
Physical Therapy Wound Evaluation Patient Details Name: Shannon [name] MRN: [id number] DOB: 02-27-1946 Today's Date: 06/22/2020   History of Present Illness  Pt is a 74 y.o. male with PMH significant of quadriplegia (2007), Chiari I malformation with syringomyelia, s/p C3-4 and C4-5 anterior cervical discectomy and decompression (04/05/2020), recent fall at home w/ epidural hematoma, hardware removal and hematoma evacuation on 04/22/20, now quadriplegic with dysphagia, stage IV sacral decubitus wounds, failure to thrive, recurrent hospitalizations; now readmitted 12/27 with severe frank hematuria and UTI. 12/14 found to be incidentally COVID+ and no longer on precautions as of 12/30.      06/22/20 1600  Pressure Injury 06/22/20 Sacrum Posterior Stage 4 - Full thickness tissue loss with exposed bone, tendon or muscle. PHYSICAL THERAPY/HYDROTHERAPY  Date First Assessed/Time First Assessed: 06/22/20 1400   Location: Sacrum  Location Orientation: Posterior  Staging: Stage 4 - Full thickness tissue loss with exposed bone, tendon or muscle.  Wound Description (Comments): PHYSICAL THERAPY/HYDROTHERAPY...  Dressing Type Abdominal binder;Tape dressing;Gauze (Comment) (saline gauze)  Dressing Changed  Dressing Change Frequency PRN  State of Healing Non-healing  Site / Wound Assessment Clean;Brown;Pink;Yellow;Red;Pale;Painful  % Wound base Red or Granulating 70%  % Wound base Yellow/Fibrinous Exudate 20%  % Wound base Black/Eschar 10% (brown)  Peri-wound Assessment Intact  Wound Length (cm) 11.6 cm  Wound Width (cm) 7.4 cm  Wound Depth (cm) 4 cm  Wound Surface Area (cm^2) 85.84 cm^2  Wound Volume (cm^3) 343.36 cm^3  Margins Unattached edges (unapproximated)  Drainage Amount Minimal  Drainage Description Serosanguineous  Treatment Cleansed;Debridement (Selective);Hydrotherapy (Pulse lavage);Packing (Saline gauze)  Hydrotherapy  Pulsed lavage therapy - wound location Sacrum  Pulsed Lavage  with Suction (psi) 4 psi  Pulsed Lavage with Suction - Normal Saline Used 500 mL  Pulsed Lavage Tip Tip with splash shield  Selective Debridement  Selective Debridement - Location Sacrum  Selective Debridement - Tools Used Forceps;Scissors  Selective Debridement - Tissue Removed brown devitalized tissue  Wound Therapy - Assess/Plan/Recommendations  Wound Therapy - Clinical Statement Patient agreeable to hydrotherapy with 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 No phot taken today however compared to prior documentation on 06/09/20 from wound therapist at [facility] wound appears cleaner with improved red/pink healthy tissue. Pt has significant pain with pulsed lavage to wound bed and 500 mL used to cleanse. Some brown devitalized tissue is noted around teh wound edges and pt would benefit from appliction of Santyle to tissue for enzymatic debridement to reduce bioburden and promote healing. Patient does not require further hydrotherapy based on improvements in wound and poor tolerance to pulsed lavage and selective debridement. Recommend RN staff perform dressing changes every other day/PRN if dressing is soiled. Recommend saline gauze packign for sacral wound with santyle application to brown fibrous tissue at wound edges. Recommend xeroform or silicon dressing be applied to smaller wounds on buttocks to promote skin healing.  Wound Therapy - Functional Problem List Decreased mobility and global weakness  Factors Delaying/Impairing Wound Healing Immobility  Hydrotherapy Plan Debridement;Dressing change;Patient/family education;Pulsatile lavage with suction  Wound Therapy - Frequency Other (comment)  Wound Therapy - Current Recommendations Other (comment) (RN every other day/PRN dressing changes)  Wound Therapy - Follow Up Recommendations Skilled nursing facility  Wound Plan See above                   Precautions / Restrictions Precautions Precautions: Fall;Cervical Precaution Booklet  Issued: No Required Braces or Orthoses: Cervical Brace Cervical Brace: Hard collar;At all times Restrictions Weight  Bearing Restrictions: No Other Position/Activity Restrictions: Per prior documentation, "plan to leave cervical collar for 3 months total, until mid-January"      Mobility  Bed Mobility     Rolling: +2 for physical assistance;Total assist;+2 for safety/equipment         General bed mobility comments: performed rolls for hydrotherapy and repositioning         Pertinent Vitals/Pain Pain Assessment: Faces Faces Pain Scale: Hurts worst Pain Location: sacral wound with hydrotherapy Pain Descriptors / Indicators: Grimacing;Moaning Pain Intervention(s): Monitored during session;Premedicated before session    Home Living Family/patient expects to be discharged to:: Skilled nursing facility Living Arrangements: Alone;Children Available Help at Discharge: Family Type of Home: House Home Access: Stairs to enter Entrance Stairs-Rails: Right Entrance Stairs-Number of Steps: 5-6 Home Layout: One level Home Equipment: Walker - 4 wheels Additional Comments: Pt has been at [facility] for rehab since last admission. Prior to that he was d/c from CIR and home a short period of time with falls. He will return to [facility] for more rehab.    Prior Function Level of Independence: Needs assistance   Gait / Transfers Assistance Needed: assist from staff at SNF  ADL's / Homemaking Assistance Needed: assist from staff at SNF, not able to feed himself               Communication   Communication: No difficulties  Cognition Arousal/Alertness: Awake/alert Behavior During Therapy: Flat affect Overall Cognitive Status: No family/caregiver present to determine baseline cognitive functioning          General Comments: pt generally alert and with appropriate responses to questions.         06/22/20 1706  PT Wound Care Charges  $Wound Debridement up to 20 cm < or  equal to 20 cm  $PT Hydrotherapy Dressing 1 dressing  $PT PLS Gun and Tip 1 Supply  $PT Hydrotherapy Visit 1 Visit       Wynn Maudlin, DPT Acute Rehabilitation Services Office [phone] Pager [phone]    Anitra Lauth 06/22/2020, 4:55 PM

## 2020-06-22 NOTE — Progress Notes (Addendum)
PROGRESS NOTE    [name]  HWE:[id number] DOB: December 19, 1945 DOA: 06/19/2020 PCP: [name], MD   Brief Narrative:  Shannon [name] is a 74 y.o. 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  Subsequently sustained a fall resulting in epidural hematoma, underwent reexploration and removal of hardware and evacuation of hematoma on October 30.  Has been on a cervical collar since then and with plans to keep it on until mid January.  Patient also with history of BPH, chronic indwelling Foley catheter, hematuria.  He had a prolonged hospital stay last month and was actually discharged just a week ago to short-term rehab at a skilled nursing facility [facility]).  During his previous admission he had septic shock due to UTI and was also noted to have aspiration pneumonia.  He was also found to be incidentally positive for COVID-19 at that time and received monoclonal antibody treatment. He was sent from the skilled nursing facility to the ER initially on December 24 for problems with his Foley catheter.  The Foley was replaced at that time and he was sent back to the skilled nursing facility.  He was sent back today to the ER due to hematuria.  Patient was also complaining of lower abdominal pain.  He was also noted to have a cough. CT scan of the abdomen pelvis showed nonobstructing renal stones.  No concerning findings noted in the bladder.  However patient was noted to have ill-defined collection of air overlying the coccyx in the lower sacrum.  He was given broad-spectrum antibiotics and will be hospitalized for further management.  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 Walden Field with urology contacted - indicates occasional hematuria is not uncommon with indwelling Foley, will evaluate for other secondary causes, we appreciate their insight and recommendations - Foley intially removed as holiday, poor urine output/obstruction - Coude catheter placed by urology team 12/29 - Urine culture positive for achromobacter as above - transition to meropenem for coverage and DC flagyl/cefepime as below given broadened coverage with carbapenem. - No issues with obstruction, urinary retention or mechanical issues with Foley per patient prior to admission - Multiple episodes of hematuria over the past few months resolving with bladder irrigation - No structural lesions identified on CT scan  Sacral wound with likely superficial infection, POA:  - Sx following, appreciate insight and recommendations- likely debridement in the next 24-48h - Transition to meropenem given sensitivities of UTI - this will cover abdomen/skin as well; DC flagyl cefepime.  RESOLVED COVID-19 infection, questionable, without hypoxia, POA:  - Remove precautions as of 06/22/20 - Patient was incidentally found to be positive for COVID-19 before he was discharged to skilled nursing facility - positive June 06, 2020.  - S/P monoclonal antibodies at that time.   - CRP mild and downtrending - Cough is chronic in the setting of dysphagia/quadriplegia - no indication to continue covid precautions given clinical picture and now 16 days after incidental covid.  History of dysphagia and recent aspiration  pneumonia, chronic cough:  - Most likely due to his cervical spine issues.   - He has a cervical collar.   - Diet per SLP - cough resolved with diet improvement  Quadriplegia secondary to cervical  myelopathy:  - He underwent C-spine surgery on 10/13.  - Subsequently sustained a fall resulting in epidural hematoma requiring surgical evacuation of the hematoma and removal of hardware.  - He has a cervical collar in place.  Followed by Dr. Lovell Sheehan with neurosurgery.  - Continue with cervical collar for now.  - Plan is to have it removed by Dr. Lovell Sheehan in mid January.  Bilateral nephrolithiasis:  - Noted on CT scan.  No evidence for obstruction.  Incidental cholelithiasis:  - Noted on CT scan.  No evidence for acute cholecystitis.  LFTs are normal.  Chronic hypotension:  - Continue with midodrine.  Seems to be stable currently.  Anemia of chronic disease/vitamin B12 deficiency/acute blood loss anemia - Hemoglobin stable today compared to previous values.  Monitor daily for now.  History of BPH:  - Continue Flomax.  Severe protein calorie malnutrition: Nutritionist to see.  Goals of care: Patient with steady decline over the last few months.  Seen by palliative care during his previous hospitalization.  Patient and family wanted to continue with full scope of care.  We will reinvolve them during this admission as well.  DVT prophylaxis: SCDs Code Status: Full - continue to discuss goals of care as above Family Communication: Updated [name] (niece) at length about prognosis, clinical course, and current therapies  Status is: Inpt  Dispo: The patient is from: SNF              Anticipated d/c is to: SNF              Anticipated d/c date is: 72 to 96 hours              Patient currently not medically stable for discharge given ongoing need for surgical procedure, antibiotics and further evaluation with urology.  Consultants:   Urology, general surgery  Procedures:   Hydrotherapy 06/22/20 - ongoing  Antimicrobials:  Cefepime, Flagyl 12/28 - 12/29 Vancomycin 12/28 -  12/30 Meropenem 12/29 - ongoing  Subjective: No acute issues or events overnight denies nausea,  vomiting, diarrhea, constipation, headache, fevers, chills. Somewhat depressed today, somnolent but easily arousable.  Objective: Vitals:   06/21/20 0725 06/21/20 1330 06/21/20 2003 06/22/20 0525  BP: 106/74 103/68 104/66 105/69  Pulse: 91 (!) 101 97 99  Resp: 19 18 18 16   Temp: 98.4 F (36.9 C) 98.8 F (37.1 C) 98.5 F (36.9 C) 98.1 F (36.7 C)  TempSrc:  Oral Oral Oral  SpO2: 97% 98% 100% 98%  Weight:      Height:        Intake/Output Summary (Last 24 hours) at 06/22/2020 0750 Last data filed at 06/22/2020 0600 Gross per 24 hour  Intake 960 ml  Output 1700 ml  Net -740 ml   Filed Weights   06/19/20 1126  Weight: 106 kg    Examination:  General:  Pleasantly resting in bed, No acute distress. HEENT:  Normocephalic atraumatic.  Sclerae nonicteric, noninjected.  Extraocular movements intact bilaterally. Neck: Postsurgical bandages clean dry intact, c-collar in place Lungs:  Clear to auscultate bilaterally without rhonchi, wheeze, or rales. Heart:  Regular rate and rhythm.  Without murmurs, rubs, or gallops. Abdomen/GU:  Soft, nontender, nondistended.  Without guarding or rebound.  Foley draining brown urine Extremities: Without cyanosis,  clubbing, edema, or obvious deformity. Vascular:  Dorsalis pedis and posterior tibial pulses palpable bilaterally. Skin:  Warm and dry, no erythema, large sacral decubitus ulcer (see wound notes for details)  Data Reviewed: I have personally reviewed following labs and imaging studies  CBC: Recent Labs  Lab 06/19/20 1312 06/20/20 0348 06/21/20 0337 06/22/20 0349  WBC 12.5* 11.0* 10.9* 8.8  NEUTROABS 9.5*  --   --   --   HGB 9.0* 8.2* 7.9* 8.0*  HCT 29.3* 27.8* 26.3* 26.6*  MCV 97.0 99.3 97.8 98.5  PLT 237 198 170 169   Basic Metabolic Panel: Recent Labs  Lab 06/19/20 1312 06/20/20 0348 06/21/20 0337 06/22/20 0349  NA 141 142 139 139  K 3.3* 3.2* 3.3* 3.4*  CL 102 105 105 106  CO2 30 27 25 28   GLUCOSE 115* 85 157*  112*  BUN 13 11 12 12   CREATININE 0.57* 0.47* 0.53* 0.49*  CALCIUM 8.3* 8.2* 8.0* 7.9*  MG  --  1.8  --   --    GFR: Estimated Creatinine Clearance: 102 mL/min (A) (by C-G formula based on SCr of 0.49 mg/dL (L)). Liver Function Tests: Recent Labs  Lab 06/19/20 1312 06/20/20 0348 06/21/20 0337 06/22/20 0349  AST 22 22 22 15   ALT 13 12 12 11   ALKPHOS 86 71 63 58  BILITOT 0.9 0.9 0.4 0.5  PROT 6.2* 5.7* 5.5* 5.3*  ALBUMIN 2.0* 1.9* 1.8* 1.7*   No results for input(s): LIPASE, AMYLASE in the last 168 hours. No results for input(s): AMMONIA in the last 168 hours. Coagulation Profile: Recent Labs  Lab 06/19/20 1312 06/20/20 0348  INR 1.3* 1.4*   Cardiac Enzymes: No results for input(s): CKTOTAL, CKMB, CKMBINDEX, TROPONINI in the last 168 hours. BNP (last 3 results) No results for input(s): PROBNP in the last 8760 hours. HbA1C: No results for input(s): HGBA1C in the last 72 hours. CBG: No results for input(s): GLUCAP in the last 168 hours. Lipid Profile: No results for input(s): CHOL, HDL, LDLCALC, TRIG, CHOLHDL, LDLDIRECT in the last 72 hours. Thyroid Function Tests: No results for input(s): TSH, T4TOTAL, FREET4, T3FREE, THYROIDAB in the last 72 hours. Anemia Panel: No results for input(s): VITAMINB12, FOLATE, FERRITIN, TIBC, IRON, RETICCTPCT in the last 72 hours. Sepsis Labs: No results for input(s): PROCALCITON, LATICACIDVEN in the last 168 hours.  Recent Results (from the past 240 hour(s))  Urine culture     Status: None   Collection Time: 06/16/20  4:00 AM   Specimen: Urine, Random  Result Value Ref Range Status   Specimen Description   Final    URINE, RANDOM Performed at [facility], [address], [place], [place]    Special Requests   Final    NONE Performed at [facility], [address], [place], [place]    Culture   Final    NO GROWTH Performed at [facility], [address],  [place], [address] [facility]    Report Status 06/17/2020 FINAL  Final  Urine culture     Status: Abnormal   Collection Time: 06/19/20  2:14 PM   Specimen: Urine, Random  Result Value Ref Range Status   Specimen Description   Final    URINE, RANDOM Performed at [facility], [address] [place], [place]    Special Requests   Final    NONE Performed at [facility], [address] [place], [place]  Culture >=100,000 COLONIES/mL ACHROMOBACTER XYLOSOXIDANS (A)  Final   Report Status 06/21/2020 FINAL  Final   Organism ID, Bacteria ACHROMOBACTER XYLOSOXIDANS (A)  Final      Susceptibility   Achromobacter xylosoxidans - MIC*    CEFAZOLIN >=64 RESISTANT Resistant     GENTAMICIN >=16 RESISTANT Resistant     CIPROFLOXACIN >=4 RESISTANT Resistant     IMIPENEM 4 SENSITIVE Sensitive     TRIMETH/SULFA <=20 SENSITIVE Sensitive     * >=100,000 COLONIES/mL ACHROMOBACTER XYLOSOXIDANS     Radiology Studies: No results found.  Scheduled Meds: . Chlorhexidine Gluconate Cloth  6 each Topical Daily  . docusate sodium  100 mg Oral BID  . feeding supplement  237 mL Oral TID BM  . ferrous sulfate  325 mg Oral BID WC  . mouth rinse  15 mL Mouth Rinse BID  . midodrine  10 mg Oral BID WC  . multivitamin with minerals  1 tablet Oral Daily  . nutrition supplement (JUVEN)  1 packet Oral BID WC  . pantoprazole  40 mg Oral QHS  . polyethylene glycol  17 g Oral BID  . tamsulosin  0.4 mg Oral Daily   Continuous Infusions: . lactated ringers 50 mL/hr at 06/22/20 0419  . meropenem (MERREM) IV 1 g (06/22/20 0421)  . vancomycin Stopped (06/22/20 0021)     LOS: 3 days   Time spent: 40min  Azucena FallenWilliam C Nicolaus Andel, DO [facility]  If 7PM-7AM, please contact night-coverage [email or web address]  06/22/2020, 7:50 AM

## 2020-06-22 NOTE — Progress Notes (Signed)
Progress Note  Day of Surgery  Subjective: Patient agreeable to have [place] evaluate wound this AM. Discussed concerns related to operative debridement with patient and he understands. Having bowel function.   Objective: Vital signs in last 24 hours: Temp:  [98.1 F (36.7 C)-98.8 F (37.1 C)] 98.1 F (36.7 C) (12/30 0525) Pulse Rate:  [97-101] 99 (12/30 0525) Resp:  [16-18] 16 (12/30 0525) BP: (103-105)/(66-69) 105/69 (12/30 0525) SpO2:  [98 %-100 %] 98 % (12/30 0525) Last BM Date: 06/21/20  Intake/Output from previous day: 12/29 0701 - 12/30 0700 In: 960 [P.O.:360; IV Piggyback:600] Out: 1700 [Urine:1700] Intake/Output this shift: No intake/output data recorded.  PE: GU: patient with 3 decubitus ulcer, stage 1 decubitus over left superior buttock, small, clean ulcer above R buttock just lateral to midline, and a large ~10-14 cm sacral decubitus ulcer that is comprised of primarily necrotic tissue, there is no tunneling, no purulence, no cellulitis.   Lab Results:  Recent Labs    06/21/20 0337 06/22/20 0349  WBC 10.9* 8.8  HGB 7.9* 8.0*  HCT 26.3* 26.6*  PLT 170 169   BMET Recent Labs    06/21/20 0337 06/22/20 0349  NA 139 139  K 3.3* 3.4*  CL 105 106  CO2 25 28  GLUCOSE 157* 112*  BUN 12 12  CREATININE 0.53* 0.49*  CALCIUM 8.0* 7.9*   PT/INR Recent Labs    06/19/20 1312 06/20/20 0348  LABPROT 15.9* 16.7*  INR 1.3* 1.4*   CMP     Component Value Date/Time   NA 139 06/22/2020 0349   K 3.4 (L) 06/22/2020 0349   CL 106 06/22/2020 0349   CO2 28 06/22/2020 0349   GLUCOSE 112 (H) 06/22/2020 0349   BUN 12 06/22/2020 0349   CREATININE 0.49 (L) 06/22/2020 0349   CALCIUM 7.9 (L) 06/22/2020 0349   PROT 5.3 (L) 06/22/2020 0349   ALBUMIN 1.7 (L) 06/22/2020 0349   AST 15 06/22/2020 0349   ALT 11 06/22/2020 0349   ALKPHOS 58 06/22/2020 0349   BILITOT 0.5 06/22/2020 0349   GFRNONAA >60 06/22/2020 0349   GFRAA >60 01/09/2020 1317   Lipase  No results  found for: LIPASE     Studies/Results: No results found.  Anti-infectives: Anti-infectives (From admission, onward)   Start     Dose/Rate Route Frequency Ordered Stop   06/21/20 2000  [MAR Hold]  meropenem (MERREM) 1 g in sodium chloride 0.9 % 100 mL IVPB        (MAR Hold since Thu 06/22/2020 at 1001.Hold Reason: Transfer to a Procedural area.)   1 g 200 mL/hr over 30 Minutes Intravenous Every 8 hours 06/21/20 1308     06/20/20 1000  [MAR Hold]  vancomycin (VANCOREADY) IVPB 1250 mg/250 mL        (MAR Hold since Thu 06/22/2020 at 1001.Hold Reason: Transfer to a Procedural area.)   1,250 mg 166.7 mL/hr over 90 Minutes Intravenous Every 12 hours 06/19/20 1807     06/20/20 0200  metroNIDAZOLE (FLAGYL) IVPB 500 mg  Status:  Discontinued        500 mg 100 mL/hr over 60 Minutes Intravenous Every 8 hours 06/19/20 1745 06/21/20 1311   06/20/20 0200  ceFEPIme (MAXIPIME) 2 g in sodium chloride 0.9 % 100 mL IVPB  Status:  Discontinued        2 g 200 mL/hr over 30 Minutes Intravenous Every 8 hours 06/19/20 1807 06/21/20 1304   06/19/20 1700  vancomycin (VANCOREADY) IVPB 2000 mg/400 mL  2,000 mg 200 mL/hr over 120 Minutes Intravenous  Once 06/19/20 1646 06/19/20 2355   06/19/20 1700  metroNIDAZOLE (FLAGYL) IVPB 500 mg        500 mg 100 mL/hr over 60 Minutes Intravenous  Once 06/19/20 1646 06/19/20 2123   06/19/20 1700  ceFEPIme (MAXIPIME) 2 g in sodium chloride 0.9 % 100 mL IVPB        2 g 200 mL/hr over 30 Minutes Intravenous  Once 06/19/20 1649 06/19/20 1808       Assessment/Plan Recurrent hematuria/UTI  Quadriplegia  Chronic indwelling foley  COVID 19 - tested positive Dec 14, received monoclonal antibodies, has a cough, normal work of breathing ORA PMH dysphagia PMH aspiration  Anemia of chronic disease Chronic hypotension   Necrotic sacral decubitus ulcer  - after further discussion with attending MD, patient at higher risk of pulmonary complications given COVID infection  and general anesthesia. Since patient does not have an acute infection at this time and debridement may provide limited benefit to wound healing, will delay operative debridement at this time. - no signs of infection on exam, dry eschar present - will trial hydrotherapy for now   LOS: 3 days    Juliet Rude , [facility] Surgery 06/22/2020, 10:27 AM Please see Amion for pager number during day hours 7:00am-4:30pm

## 2020-06-23 DIAGNOSIS — N390 Urinary tract infection, site not specified: Secondary | ICD-10-CM | POA: Diagnosis not present

## 2020-06-23 DIAGNOSIS — R319 Hematuria, unspecified: Secondary | ICD-10-CM | POA: Diagnosis not present

## 2020-06-23 DIAGNOSIS — G959 Disease of spinal cord, unspecified: Secondary | ICD-10-CM | POA: Diagnosis not present

## 2020-06-23 DIAGNOSIS — Z978 Presence of other specified devices: Secondary | ICD-10-CM | POA: Diagnosis not present

## 2020-06-23 LAB — COMPREHENSIVE METABOLIC PANEL
ALT: 9 U/L (ref 0–44)
AST: 17 U/L (ref 15–41)
Albumin: 1.6 g/dL — ABNORMAL LOW (ref 3.5–5.0)
Alkaline Phosphatase: 66 U/L (ref 38–126)
Anion gap: 5 (ref 5–15)
BUN: 15 mg/dL (ref 8–23)
CO2: 27 mmol/L (ref 22–32)
Calcium: 7.9 mg/dL — ABNORMAL LOW (ref 8.9–10.3)
Chloride: 106 mmol/L (ref 98–111)
Creatinine, Ser: 0.44 mg/dL — ABNORMAL LOW (ref 0.61–1.24)
GFR, Estimated: >60 mL/min (ref >60)
Glucose, Bld: 125 mg/dL — ABNORMAL HIGH (ref 70–99)
Potassium: 3.7 mmol/L (ref 3.5–5.1)
Sodium: 138 mmol/L (ref 135–145)
Total Bilirubin: 0.5 mg/dL (ref 0.3–1.2)
Total Protein: 5.1 g/dL — ABNORMAL LOW (ref 6.5–8.1)

## 2020-06-23 LAB — CBC
HCT: 26.9 % — ABNORMAL LOW (ref 39.0–52.0)
Hemoglobin: 8.2 g/dL — ABNORMAL LOW (ref 13.0–17.0)
MCH: 30 pg (ref 26.0–34.0)
MCHC: 30.5 g/dL (ref 30.0–36.0)
MCV: 98.5 fL (ref 80.0–100.0)
Platelets: 177 10*3/uL (ref 150–400)
RBC: 2.73 MIL/uL — ABNORMAL LOW (ref 4.22–5.81)
RDW: 15.6 % — ABNORMAL HIGH (ref 11.5–15.5)
WBC: 9.7 10*3/uL (ref 4.0–10.5)
nRBC: 0 % (ref 0.0–0.2)

## 2020-06-23 NOTE — TOC Initial Note (Signed)
Transition of Care [facility]) - Initial/Assessment Note    Patient Details  Name: Shannon [name] MRN: [id number] Date of Birth: [birth year]  Transition of Care [facility]) CM/SW Contact:    Lennart Pall, LCSW Phone Number: 06/23/2020, 12:48 PM  Clinical Narrative:   Met with pt to introduce self/role and discuss anticipated dc needs.  Pt confirms that he has been a resident at [facility] since beginning of Nov but has had a few ED admits over that time.  Pt currently not medically ready for discharge.  Have left a VM for his daughter, Shannon [name], to discuss dc plans.  Per Palliative note, she may have concerns about his return to same SNF?  Have confirmed with [facility] that they are agreeable to pt's return and note they had begun talking with daughter about need for Medicaid application.  Await call from daughter.  TOC to continue to follow.                Expected Discharge Plan: Skilled Nursing Facility Barriers to Discharge: Continued Medical Work up   Patient Goals and CMS Choice        Expected Discharge Plan and Services Expected Discharge Plan: [place] In-house Referral: Clinical Social Work     Living arrangements for the past 2 months: [place]                 DME Arranged: N/A DME Agency: NA                  Prior Living Arrangements/Services Living arrangements for the past 2 months: [place] Lives with:: Facility Resident Patient language and need for interpreter reviewed:: Yes Do you feel safe going back to the place where you live?: Yes      Need for Family Participation in Patient Care: No (Comment) Care giver support system in place?: Yes (comment)   Criminal Activity/Legal Involvement Pertinent to Current Situation/Hospitalization: No - Comment as needed  Activities of Daily Living Home Assistive Devices/Equipment: C-collar ADL Screening (condition at time of admission) Patient's cognitive ability  adequate to safely complete daily activities?: No Is the patient deaf or have difficulty hearing?: No Does the patient have difficulty seeing, even when wearing glasses/contacts?: No Does the patient have difficulty concentrating, remembering, or making decisions?: Yes Patient able to express need for assistance with ADLs?: Yes Does the patient have difficulty dressing or bathing?: Yes Independently performs ADLs?: No Communication: Independent Dressing (OT): Dependent Is this a change from baseline?: Pre-admission baseline Grooming: Dependent Is this a change from baseline?: Pre-admission baseline Feeding: Dependent Is this a change from baseline?: Pre-admission baseline Bathing: Dependent Is this a change from baseline?: Pre-admission baseline Toileting: Dependent Is this a change from baseline?: Pre-admission baseline In/Out Bed: Dependent Is this a change from baseline?: Pre-admission baseline Walks in Home: Dependent Is this a change from baseline?: Pre-admission baseline Does the patient have difficulty walking or climbing stairs?: Yes Weakness of Legs: Both Weakness of Arms/Hands: Both  Permission Sought/Granted Permission sought to share information with : Family Chief Financial Officer Permission granted to share information with : Yes, Verbal Permission Granted  Share Information with NAME: daugher, Shannon [name] @ [phone]  Permission granted to share info w AGENCY: SNF        Emotional Assessment Appearance:: Appears stated age Attitude/Demeanor/Rapport: Gracious Affect (typically observed): Accepting Orientation: : Oriented to Self,Oriented to Place,Oriented to  Time,Oriented to Situation Alcohol / Substance Use: Not Applicable Psych Involvement: No (comment)  Admission diagnosis:  UTI (urinary tract infection) [N39.0] Acute cystitis with hematuria [N30.01] Patient Active Problem List   Diagnosis Date Noted  . Hematuria 06/19/2020  . Acute  lower UTI 06/19/2020  . Sacral decubitus ulcer 06/19/2020  . COVID-19 virus infection 06/06/2020 06/19/2020  . UTI (urinary tract infection) 06/19/2020  . Palliative care by specialist   . Goals of care, counseling/discussion   . Respiration abnormal   . Respiratory distress   . Protein-calorie malnutrition, severe 05/05/2020  . Pressure injury of skin 05/04/2020  . Pneumonia 05/03/2020  . Sepsis secondary to UTI ([place]) 05/03/2020  . Urinary retention 05/03/2020  . Chronic indwelling Foley catheter 05/03/2020  . 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 Myelopathy ([place]) 04/22/2020  . Quadriplegia and quadriparesis ([place])   . Acute blood loss anemia   . Slow transit constipation   . Essential hypertension   . Cervical myelopathy ([place]) 04/07/2020  . Quadriparesis ([place])   . Benign essential HTN   . Post-operative pain   . Neuropathic pain   . Asthma   . 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   PCP:  Tammi Sou, MD Pharmacy:  No Pharmacies Listed    Social Determinants of Health (SDOH) Interventions    Readmission Risk Interventions No flowsheet data found.

## 2020-06-23 NOTE — Plan of Care (Signed)
  Problem: Pain Managment: Goal: General experience of comfort will improve Outcome: Progressing   Problem: Safety: Goal: Ability to remain free from injury will improve Outcome: Progressing   

## 2020-06-23 NOTE — Progress Notes (Signed)
PROGRESS NOTE    Milt Coye  WGY:[id number] DOB: [birth year] DOA: 06/19/2020 PCP: [name], MD   Brief Narrative:  Mervil Wacker is a 74 y.o. 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  Subsequently sustained a fall resulting in epidural hematoma, underwent reexploration and removal of hardware and evacuation of hematoma on October 30.  Has been on a cervical collar since then and with plans to keep it on until mid January.  Patient also with history of BPH, chronic indwelling Foley catheter, hematuria.  He had a prolonged hospital stay last month and was actually discharged just a week ago to short-term rehab at a skilled nursing facility [facility]).  During his previous admission he had septic shock due to UTI and was also noted to have aspiration pneumonia.  He was also found to be incidentally positive for COVID-19 at that time and received monoclonal antibody treatment. He was sent from the skilled nursing facility to the ER initially on December 24 for problems with his Foley catheter.  The Foley was replaced at that time and he was sent back to the skilled nursing facility.  He was sent back today to the ER due to hematuria.  Patient was also complaining of lower abdominal pain.  He was also noted to have a cough. CT scan of the abdomen pelvis showed nonobstructing renal stones.  No concerning findings noted in the bladder.  However patient was noted to have ill-defined collection of air overlying the coccyx in the lower sacrum.  He was given broad-spectrum antibiotics and will be hospitalized for further management.  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 Walden Field with urology contacted - indicates occasional hematuria is not uncommon with indwelling Foley, will evaluate for other secondary causes, we appreciate their insight and recommendations - Foley intially removed as holiday, poor urine output/obstruction - Coude catheter placed by urology team 12/29 -urine continues to be dark brown-tinged likely in setting of old blood, no overt hematuria or maroon urine noted at this point - Urine culture positive for achromobacter as above -patient to complete antibiotic course today 06/23/2020 -we will follow for 24 to 48 hours after antibiotic completion to ensure resolution of symptoms, patient remains afebrile without leukocytosis. - Multiple episodes of hematuria over the past few months resolving with bladder irrigation - No structural lesions identified on CT scan  Sacral wound with unlikely superficial infection, POA:  - Sx following, appreciate insight and recommendations; upon reevaluation sharp debridement for this was continued aggressive and hydrotherapy was initiated instead -Patient tolerated hydrotherapy 06/22/2020 quite well, per hydrotherapy team and wound care team given marked improvement and wound appearance with hydrotherapy alone they further recommend only routine wound care dressings at this point.  Given this likely is not a site for ongoing infection will shorten antibiotic course to cover UTI only as above.  RESOLVED COVID-19 infection, questionable, without hypoxia, POA:  - Remove precautions as of 06/22/20 - Patient was incidentally found to be positive for COVID-19  before he was discharged to skilled nursing facility - positive June 06, 2020.  - S/P monoclonal antibodies at that time.   - CRP mild and downtrending -  Cough is chronic in the setting of dysphagia/quadriplegia (which has resolved with dietary changes) - no indication to continue covid precautions given clinical picture and now 16 days after incidental covid.  History of dysphagia and recent aspiration pneumonia, chronic cough, resolving:  - Most likely due to his cervical spine issues.   - He has a cervical collar.   - Diet per SLP - cough resolved with diet improvement  Quadriplegia secondary to cervical myelopathy:  - He underwent C-spine surgery on 10/13.  - Subsequently sustained a fall resulting in epidural hematoma requiring surgical evacuation of the hematoma and removal of hardware.  - He has a cervical collar in place.  Followed by Dr. Lovell Sheehan with neurosurgery.  - Continue with cervical collar for now.  - Plan is to have it removed by Dr. Lovell Sheehan in mid January.  Bilateral nephrolithiasis:  - Noted on CT scan.  No evidence for obstruction.  Incidental cholelithiasis:  - Noted on CT scan.  No evidence for acute cholecystitis.  LFTs are normal.  Chronic hypotension:  - Continue with midodrine.  Seems to be stable currently.  Anemia of chronic disease/vitamin B12 deficiency/acute blood loss anemia - Hemoglobin stable today compared to previous values.  Monitor daily for now.  History of BPH:  - Continue Flomax.  Severe protein calorie malnutrition: Nutritionist to see.  Goals of care: Patient with steady decline over the last few months.  Seen by palliative care during his previous hospitalization.  Patient and family wanted to continue with full scope of care.  We will reinvolve them during this admission as well.  DVT prophylaxis: SCDs Code Status: Full - continue to discuss goals of care as above Family Communication: Updated [name] (niece) at length about prognosis, clinical course, and current therapies  Status is: Inpt  Dispo: The patient is from: SNF              Anticipated d/c is to: SNF               Anticipated d/c date is: 48-72 hours              Patient currently not medically stable for discharge given ongoing need for surgical procedure, antibiotics and further evaluation with urology.  Consultants:   Urology, general surgery  Procedures:   Hydrotherapy 06/22/20 - ongoing  Antimicrobials:  Cefepime, Flagyl 12/28 - 12/29 Vancomycin 12/28 -  12/30 Meropenem 12/29 - 12/31  Subjective: No acute issues or events overnight, patient feels quite well this morning, somewhat in better spirits than previously, lengthy discussion today at bedside about favorite foods, will attempt to have nursing staff provide improved diet now that speech has cleared patient for soft diet.  Patient denies headache, fevers, chills.  Objective: Vitals:   06/22/20 1330 06/22/20 2016 06/23/20 0419 06/23/20 0549  BP: 112/71 110/68 107/70 105/68  Pulse: 84 98 96 91  Resp: 18 18 18 18   Temp: 98.1 F (36.7 C) 98.3 F (36.8 C) 98.8 F (37.1 C) 97.8 F (36.6 C)  TempSrc: Oral Oral Oral Oral  SpO2:  97% 96% 96%  Weight:      Height:        Intake/Output Summary (Last 24 hours) at 06/23/2020 0738 Last data filed at 06/23/2020 0436 Gross per 24 hour  Intake 724 ml  Output 750  ml  Net -26 ml   Filed Weights   06/19/20 1126  Weight: 106 kg    Examination:  General:  Pleasantly resting in bed, No acute distress. HEENT:  Normocephalic atraumatic.  Sclerae nonicteric, noninjected.  Extraocular movements intact bilaterally. Neck: Postsurgical bandages clean dry intact, c-collar in place Lungs:  Clear to auscultate bilaterally without rhonchi, wheeze, or rales. Heart:  Regular rate and rhythm.  Without murmurs, rubs, or gallops. Abdomen/GU:  Soft, nontender, nondistended.  Without guarding or rebound.  Urinary catheter draining dark brown urine Extremities: Without cyanosis, clubbing, edema, or obvious deformity. Vascular:  Dorsalis pedis and posterior tibial pulses palpable bilaterally. Skin:   Warm and dry, no erythema, large sacral decubitus ulcer (see wound notes for details)  Data Reviewed: I have personally reviewed following labs and imaging studies  CBC: Recent Labs  Lab 06/19/20 1312 06/20/20 0348 06/21/20 0337 06/22/20 0349 06/23/20 0401  WBC 12.5* 11.0* 10.9* 8.8 9.7  NEUTROABS 9.5*  --   --   --   --   HGB 9.0* 8.2* 7.9* 8.0* 8.2*  HCT 29.3* 27.8* 26.3* 26.6* 26.9*  MCV 97.0 99.3 97.8 98.5 98.5  PLT 237 198 170 169 177   Basic Metabolic Panel: Recent Labs  Lab 06/19/20 1312 06/20/20 0348 06/21/20 0337 06/22/20 0349 06/23/20 0401  NA 141 142 139 139 138  K 3.3* 3.2* 3.3* 3.4* 3.7  CL 102 105 105 106 106  CO2 30 27 25 28 27   GLUCOSE 115* 85 157* 112* 125*  BUN 13 11 12 12 15   CREATININE 0.57* 0.47* 0.53* 0.49* 0.44*  CALCIUM 8.3* 8.2* 8.0* 7.9* 7.9*  MG  --  1.8  --   --   --    GFR: Estimated Creatinine Clearance: 102 mL/min (A) (by C-G formula based on SCr of 0.44 mg/dL (L)). Liver Function Tests: Recent Labs  Lab 06/19/20 1312 06/20/20 0348 06/21/20 0337 06/22/20 0349 06/23/20 0401  AST 22 22 22 15 17   ALT 13 12 12 11 9   ALKPHOS 86 71 63 58 66  BILITOT 0.9 0.9 0.4 0.5 0.5  PROT 6.2* 5.7* 5.5* 5.3* 5.1*  ALBUMIN 2.0* 1.9* 1.8* 1.7* 1.6*   No results for input(s): LIPASE, AMYLASE in the last 168 hours. No results for input(s): AMMONIA in the last 168 hours. Coagulation Profile: Recent Labs  Lab 06/19/20 1312 06/20/20 0348  INR 1.3* 1.4*   Cardiac Enzymes: No results for input(s): CKTOTAL, CKMB, CKMBINDEX, TROPONINI in the last 168 hours. BNP (last 3 results) No results for input(s): PROBNP in the last 8760 hours. HbA1C: No results for input(s): HGBA1C in the last 72 hours. CBG: No results for input(s): GLUCAP in the last 168 hours. Lipid Profile: No results for input(s): CHOL, HDL, LDLCALC, TRIG, CHOLHDL, LDLDIRECT in the last 72 hours. Thyroid Function Tests: No results for input(s): TSH, T4TOTAL, FREET4, T3FREE, THYROIDAB  in the last 72 hours. Anemia Panel: No results for input(s): VITAMINB12, FOLATE, FERRITIN, TIBC, IRON, RETICCTPCT in the last 72 hours. Sepsis Labs: No results for input(s): PROCALCITON, LATICACIDVEN in the last 168 hours.  Recent Results (from the past 240 hour(s))  Urine culture     Status: None   Collection Time: 06/16/20  4:00 AM   Specimen: Urine, Random  Result Value Ref Range Status   Specimen Description   Final    URINE, RANDOM Performed at [facility], [address] [place], [place] [id number]    Special Requests   Final  NONE Performed at [facility], [address] [place], [place] [address]    Culture   Final    NO GROWTH Performed at [facility], [address] [place], [place] [address]    Report Status 06/17/2020 FINAL  Final  Urine culture     Status: Abnormal   Collection Time: 06/19/20  2:14 PM   Specimen: Urine, Random  Result Value Ref Range Status   Specimen Description   Final    URINE, RANDOM Performed at [facility], [address], [place], [place] [address]    Special Requests   Final    NONE Performed at [facility], [address] [place], [place] [address]    Culture >=100,000 COLONIES/mL ACHROMOBACTER XYLOSOXIDANS (A)  Final   Report Status 06/21/2020 FINAL  Final   Organism ID, Bacteria ACHROMOBACTER XYLOSOXIDANS (A)  Final      Susceptibility   Achromobacter xylosoxidans - MIC*    CEFAZOLIN >=64 RESISTANT Resistant     GENTAMICIN >=16 RESISTANT Resistant     CIPROFLOXACIN >=4 RESISTANT Resistant     IMIPENEM 4 SENSITIVE Sensitive     TRIMETH/SULFA <=20 SENSITIVE Sensitive     * >=100,000 COLONIES/mL ACHROMOBACTER XYLOSOXIDANS     Radiology Studies: No results found.  Scheduled Meds: . Chlorhexidine Gluconate Cloth  6 each Topical Daily  . docusate sodium  100 mg Oral BID  . feeding supplement  237 mL Oral TID BM  . ferrous sulfate  325  mg Oral BID WC  . mouth rinse  15 mL Mouth Rinse BID  . midodrine  10 mg Oral BID WC  . multivitamin with minerals  1 tablet Oral Daily  . nutrition supplement (JUVEN)  1 packet Oral BID WC  . pantoprazole  40 mg Oral QHS  . polyethylene glycol  17 g Oral BID  . tamsulosin  0.4 mg Oral Daily   Continuous Infusions: . lactated ringers 50 mL/hr at 06/23/20 0501  . meropenem (MERREM) IV 1 g (06/23/20 0439)     LOS: 4 days   Time spent:  Azucena Fallen, DO [facility]  If 7PM-7AM, please contact night-coverage [email or web address]  06/23/2020, 7:38 AM

## 2020-06-24 DIAGNOSIS — G959 Disease of spinal cord, unspecified: Secondary | ICD-10-CM | POA: Diagnosis not present

## 2020-06-24 DIAGNOSIS — N390 Urinary tract infection, site not specified: Secondary | ICD-10-CM | POA: Diagnosis not present

## 2020-06-24 DIAGNOSIS — R319 Hematuria, unspecified: Secondary | ICD-10-CM | POA: Diagnosis not present

## 2020-06-24 DIAGNOSIS — Z978 Presence of other specified devices: Secondary | ICD-10-CM | POA: Diagnosis not present

## 2020-06-24 LAB — COMPREHENSIVE METABOLIC PANEL
ALT: 11 U/L (ref 0–44)
AST: 16 U/L (ref 15–41)
Albumin: 1.6 g/dL — ABNORMAL LOW (ref 3.5–5.0)
Alkaline Phosphatase: 74 U/L (ref 38–126)
Anion gap: 5 (ref 5–15)
BUN: 15 mg/dL (ref 8–23)
CO2: 27 mmol/L (ref 22–32)
Calcium: 7.8 mg/dL — ABNORMAL LOW (ref 8.9–10.3)
Chloride: 104 mmol/L (ref 98–111)
Creatinine, Ser: 0.39 mg/dL — ABNORMAL LOW (ref 0.61–1.24)
GFR, Estimated: >60 mL/min (ref >60)
Glucose, Bld: 118 mg/dL — ABNORMAL HIGH (ref 70–99)
Potassium: 3.6 mmol/L (ref 3.5–5.1)
Sodium: 136 mmol/L (ref 135–145)
Total Bilirubin: 0.4 mg/dL (ref 0.3–1.2)
Total Protein: 5.1 g/dL — ABNORMAL LOW (ref 6.5–8.1)

## 2020-06-24 LAB — CBC
HCT: 28.1 % — ABNORMAL LOW (ref 39.0–52.0)
Hemoglobin: 8.6 g/dL — ABNORMAL LOW (ref 13.0–17.0)
MCH: 29.9 pg (ref 26.0–34.0)
MCHC: 30.6 g/dL (ref 30.0–36.0)
MCV: 97.6 fL (ref 80.0–100.0)
Platelets: 173 10*3/uL (ref 150–400)
RBC: 2.88 MIL/uL — ABNORMAL LOW (ref 4.22–5.81)
RDW: 15.6 % — ABNORMAL HIGH (ref 11.5–15.5)
WBC: 8.1 10*3/uL (ref 4.0–10.5)
nRBC: 0 % (ref 0.0–0.2)

## 2020-06-24 NOTE — Plan of Care (Signed)
Reviewed potential for possible D/C back to [facility]

## 2020-06-24 NOTE — Progress Notes (Signed)
PROGRESS NOTE    [name]  QMV:[id number] DOB: 06-06-1946 DOA: 06/19/2020 PCP: [name], MD   Brief Narrative:  Shannon [name] is a 75 y.o. 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 Subsequently sustained a fall resulting in epidural hematoma, underwent reexploration and removal of hardware and evacuation of hematoma on October 30. Has been on a cervical collar since then and with plans to keep it on until mid January. Patient also with history of BPH, chronic indwelling Foley catheter, hematuria. He had a prolonged hospital stay last month and was actually discharged just a week ago to short-term rehab at a skilled nursing facility [facility]).  During his previous admission he had septic shock due to UTI and was also noted to have aspiration pneumonia. He was also found to be incidentally positive for COVID-19 at that time and received monoclonal antibody treatment. He was sent from the skilled nursing facility to the ER initially on December 24 for problems with his Foley catheter. The Foley was replaced at that time and he was sent back to the skilled nursing facility. He was sent back today to the ER due to hematuria. Patient was also complaining of lower abdominal pain.  He was also noted to have a cough. CT scan of the abdomen pelvis showed nonobstructing renal stones.  No concerning findings noted in the bladder.  However patient was noted to have ill-defined collection of air overlying the coccyx in the lower sacrum.  He was given broad-spectrum antibiotics and will be hospitalized for further management.  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 Walden Field with urology contacted - indicates occasional hematuria is not uncommon with indwelling Foley, will evaluate for other secondary causes, we appreciate their insight and recommendations - Foley intially removed as holiday, poor urine output/obstruction - Coude catheter placed by urology team 12/29 -urine continues to be dark brown-tinged likely in setting of old blood, no overt hematuria or maroon urine noted at this point - Urine culture positive for achromobacter as above -patient to complete antibiotic course today 12/31 -we will follow for 24 more hours after antibiotic completion to ensure resolution of symptoms, patient remains afebrile without leukocytosis - Multiple episodes of hematuria over the past few months resolving with bladder irrigation - No structural lesions identified on CT scan  Sacral wound with unlikely superficial infection, POA:  - Sx following, appreciate insight and recommendations; upon reevaluation sharp debridement for this was continued aggressive and hydrotherapy was initiated instead - Patient tolerated hydrotherapy 06/22/2020 quite well, per hydrotherapy team and wound care team given marked improvement and wound appearance with hydrotherapy alone they further recommend only routine wound care dressings at this point.  Given this likely is not a site for ongoing infection will shorten antibiotic course to cover UTI only as above  RESOLVED COVID-19 infection, questionable, without hypoxia, POA:  - Remove precautions as of 06/22/20 - Patient was incidentally found to be positive for COVID-19 before he was discharged to skilled nursing facility -  positive back on June 06, 2020 - S/P monoclonal antibodies at that time.   - CRP mild and downtrending without  treatment (likely 2/2 UTI) - Cough is chronic in the setting of dysphagia/quadriplegia (which has resolved with dietary changes) - no indication to continue covid precautions given clinical picture and now 16 days after incidental covid.  History of dysphagia and recent aspiration pneumonia, chronic cough, resolving:  - Most likely due to his cervical spine issues.   - He has a cervical collar.   - Diet per SLP - cough resolved with diet/feeding improvement  Quadriplegia secondary to cervical myelopathy:  - He underwent C-spine surgery on 10/13 - Subsequently sustained a fall resulting in epidural hematoma requiring surgical evacuation of the hematoma and removal of hardware.  - He has a cervical collar in place.  Followed by Dr. Arnoldo Morale with neurosurgery.  - Continue with cervical collar for now.  - Plan is to have it removed by Dr. Arnoldo Morale in mid January.  Bilateral nephrolithiasis:  - Noted on CT scan.  No evidence for obstruction.  Incidental cholelithiasis:  - Noted on CT scan.  No evidence for acute cholecystitis.  LFTs are normal.  Chronic hypotension:  - Continue with midodrine.  Seems to be stable currently.  Anemia of chronic disease/vitamin B12 deficiency/acute blood loss anemia - Hemoglobin stable today compared to previous values.  Monitor daily for now.  History of BPH:  - Continue Flomax.  Severe protein calorie malnutrition: Nutritionist to see.  Goals of care: Patient with steady decline over the last few months.  Seen by palliative care during his previous hospitalization.  Patient and family wanted to continue with full scope of care.  We will reinvolve them during this admission as well.  DVT prophylaxis: SCDs Code Status: Full - continue to discuss goals of care as above Family Communication: Updated Peter Congo (niece) at length about prognosis, clinical course, and current therapies  Status is: Inpt  Dispo: The patient is from: SNF               Anticipated d/c is to: SNF              Anticipated d/c date is: 24-48 hours              Patient currently not medically stable for discharge given ongoing need for surgical procedure, antibiotics and further evaluation with urology.  Consultants:   Urology, general surgery  Procedures:   Hydrotherapy 06/22/20 x1  Antimicrobials:  Cefepime, Flagyl 12/28 - 12/29 Vancomycin 12/28 -  12/30 Meropenem 12/29 - 12/31  Subjective: No acute issues or events overnight, patient feels quite well this morning, continues to be in good spirits, denies nausea, vomiting, diarrhea, headache, fevers, chills.  Objective: Vitals:   06/23/20 1416 06/23/20 1806 06/23/20 2202 06/24/20 0628  BP: 104/71 111/66 106/69 105/72  Pulse: 89 81 93 90  Resp: 17 17 16 16   Temp: 98.3 F (36.8 C) 98 F (36.7 C) 98.2 F (36.8 C) 98.7 F (37.1 C)  TempSrc: Oral Oral Oral Oral  SpO2: 96% 98% 96% 95%  Weight:      Height:        Intake/Output Summary (Last 24 hours) at 06/24/2020 0721 Last data filed at 06/24/2020 9381 Gross per 24 hour  Intake 540 ml  Output 1050 ml  Net -510 ml   Filed Weights   06/19/20 1126  Weight: 106 kg    Examination:  General:  Pleasantly resting in bed, No acute  distress. HEENT:  Normocephalic atraumatic.  Sclerae nonicteric, noninjected.  Extraocular movements intact bilaterally. Neck: Postsurgical bandages clean dry intact, c-collar in place Lungs:  Clear to auscultate bilaterally without rhonchi, wheeze, or rales. Heart:  Regular rate and rhythm.  Without murmurs, rubs, or gallops. Abdomen/GU:  Soft, nontender, nondistended.  Without guarding or rebound.  Urinary catheter draining dark brown urine Extremities: Without cyanosis, clubbing, edema, or obvious deformity. Vascular:  Dorsalis pedis and posterior tibial pulses palpable bilaterally. Skin:  Warm and dry, no erythema, large sacral decubitus ulcer - bandage clean,dry,intact (see wound notes for details)  Data  Reviewed: I have personally reviewed following labs and imaging studies  CBC: Recent Labs  Lab 06/19/20 1312 06/20/20 0348 06/21/20 0337 06/22/20 0349 06/23/20 0401 06/24/20 0306  WBC 12.5* 11.0* 10.9* 8.8 9.7 8.1  NEUTROABS 9.5*  --   --   --   --   --   HGB 9.0* 8.2* 7.9* 8.0* 8.2* 8.6*  HCT 29.3* 27.8* 26.3* 26.6* 26.9* 28.1*  MCV 97.0 99.3 97.8 98.5 98.5 97.6  PLT 237 198 170 169 177 173   Basic Metabolic Panel: Recent Labs  Lab 06/20/20 0348 06/21/20 0337 06/22/20 0349 06/23/20 0401 06/24/20 0306  NA 142 139 139 138 136  K 3.2* 3.3* 3.4* 3.7 3.6  CL 105 105 106 106 104  CO2 27 25 28 27 27   GLUCOSE 85 157* 112* 125* 118*  BUN 11 12 12 15 15   CREATININE 0.47* 0.53* 0.49* 0.44* 0.39*  CALCIUM 8.2* 8.0* 7.9* 7.9* 7.8*  MG 1.8  --   --   --   --    GFR: Estimated Creatinine Clearance: 102 mL/min (A) (by C-G formula based on SCr of 0.39 mg/dL (L)). Liver Function Tests: Recent Labs  Lab 06/20/20 0348 06/21/20 0337 06/22/20 0349 06/23/20 0401 06/24/20 0306  AST 22 22 15 17 16   ALT 12 12 11 9 11   ALKPHOS 71 63 58 66 74  BILITOT 0.9 0.4 0.5 0.5 0.4  PROT 5.7* 5.5* 5.3* 5.1* 5.1*  ALBUMIN 1.9* 1.8* 1.7* 1.6* 1.6*   No results for input(s): LIPASE, AMYLASE in the last 168 hours. No results for input(s): AMMONIA in the last 168 hours. Coagulation Profile: Recent Labs  Lab 06/19/20 1312 06/20/20 0348  INR 1.3* 1.4*   Cardiac Enzymes: No results for input(s): CKTOTAL, CKMB, CKMBINDEX, TROPONINI in the last 168 hours. BNP (last 3 results) No results for input(s): PROBNP in the last 8760 hours. HbA1C: No results for input(s): HGBA1C in the last 72 hours. CBG: No results for input(s): GLUCAP in the last 168 hours. Lipid Profile: No results for input(s): CHOL, HDL, LDLCALC, TRIG, CHOLHDL, LDLDIRECT in the last 72 hours. Thyroid Function Tests: No results for input(s): TSH, T4TOTAL, FREET4, T3FREE, THYROIDAB in the last 72 hours. Anemia Panel: No results  for input(s): VITAMINB12, FOLATE, FERRITIN, TIBC, IRON, RETICCTPCT in the last 72 hours. Sepsis Labs: No results for input(s): PROCALCITON, LATICACIDVEN in the last 168 hours.  Recent Results (from the past 240 hour(s))  Urine culture     Status: None   Collection Time: 06/16/20  4:00 AM   Specimen: Urine, Random  Result Value Ref Range Status   Specimen Description   Final    URINE, RANDOM Performed at [facility], [address], [place], [place]    Special Requests   Final    NONE Performed at [facility], [address], [place], [place]  Culture   Final    NO GROWTH Performed at [facility], [address], [place], [place] [address]    Report Status 06/17/2020 FINAL  Final  Urine culture     Status: Abnormal   Collection Time: 06/19/20  2:14 PM   Specimen: Urine, Random  Result Value Ref Range Status   Specimen Description   Final    URINE, RANDOM Performed at [facility], [address] [place], [place] [address]    Special Requests   Final    NONE Performed at [facility], [address], [place], [place] [address]    Culture >=100,000 COLONIES/mL ACHROMOBACTER XYLOSOXIDANS (A)  Final   Report Status 06/21/2020 FINAL  Final   Organism ID, Bacteria ACHROMOBACTER XYLOSOXIDANS (A)  Final      Susceptibility   Achromobacter xylosoxidans - MIC*    CEFAZOLIN >=64 RESISTANT Resistant     GENTAMICIN >=16 RESISTANT Resistant     CIPROFLOXACIN >=4 RESISTANT Resistant     IMIPENEM 4 SENSITIVE Sensitive     TRIMETH/SULFA <=20 SENSITIVE Sensitive     * >=100,000 COLONIES/mL ACHROMOBACTER XYLOSOXIDANS     Radiology Studies: No results found.  Scheduled Meds: . Chlorhexidine Gluconate Cloth  6 each Topical Daily  . docusate sodium  100 mg Oral BID  . feeding supplement  237 mL Oral TID BM  . ferrous sulfate  325 mg Oral BID WC  . mouth rinse  15 mL Mouth Rinse  BID  . midodrine  10 mg Oral BID WC  . multivitamin with minerals  1 tablet Oral Daily  . nutrition supplement (JUVEN)  1 packet Oral BID WC  . pantoprazole  40 mg Oral QHS  . polyethylene glycol  17 g Oral BID  . tamsulosin  0.4 mg Oral Daily   Continuous Infusions: . lactated ringers 50 mL/hr at 06/24/20 0705  . meropenem (MERREM) IV 1 g (06/24/20 0516)     LOS: 5 days   Time spent:  Azucena Fallen, DO [facility]  If 7PM-7AM, please contact night-coverage [email or web address]  06/24/2020, 7:21 AM

## 2020-06-24 NOTE — Plan of Care (Signed)
Plan of care reviewed. 

## 2020-06-25 DIAGNOSIS — L89150 Pressure ulcer of sacral region, unstageable: Secondary | ICD-10-CM | POA: Diagnosis not present

## 2020-06-25 DIAGNOSIS — G959 Disease of spinal cord, unspecified: Secondary | ICD-10-CM | POA: Diagnosis not present

## 2020-06-25 DIAGNOSIS — R319 Hematuria, unspecified: Secondary | ICD-10-CM | POA: Diagnosis not present

## 2020-06-25 LAB — COMPREHENSIVE METABOLIC PANEL
ALT: 12 U/L (ref 0–44)
AST: 19 U/L (ref 15–41)
Albumin: 1.6 g/dL — ABNORMAL LOW (ref 3.5–5.0)
Alkaline Phosphatase: 81 U/L (ref 38–126)
Anion gap: 6 (ref 5–15)
BUN: 9 mg/dL (ref 8–23)
CO2: 28 mmol/L (ref 22–32)
Calcium: 8 mg/dL — ABNORMAL LOW (ref 8.9–10.3)
Chloride: 103 mmol/L (ref 98–111)
Creatinine, Ser: 0.35 mg/dL — ABNORMAL LOW (ref 0.61–1.24)
GFR, Estimated: >60 mL/min (ref >60)
Glucose, Bld: 104 mg/dL — ABNORMAL HIGH (ref 70–99)
Potassium: 3.5 mmol/L (ref 3.5–5.1)
Sodium: 137 mmol/L (ref 135–145)
Total Bilirubin: 0.4 mg/dL (ref 0.3–1.2)
Total Protein: 5.1 g/dL — ABNORMAL LOW (ref 6.5–8.1)

## 2020-06-25 LAB — CBC
HCT: 29.1 % — ABNORMAL LOW (ref 39.0–52.0)
Hemoglobin: 8.9 g/dL — ABNORMAL LOW (ref 13.0–17.0)
MCH: 29.7 pg (ref 26.0–34.0)
MCHC: 30.6 g/dL (ref 30.0–36.0)
MCV: 97 fL (ref 80.0–100.0)
Platelets: 174 10*3/uL (ref 150–400)
RBC: 3 MIL/uL — ABNORMAL LOW (ref 4.22–5.81)
RDW: 15.4 % (ref 11.5–15.5)
WBC: 9.2 10*3/uL (ref 4.0–10.5)
nRBC: 0 % (ref 0.0–0.2)

## 2020-06-25 MED ORDER — LIP MEDEX EX OINT
TOPICAL_OINTMENT | CUTANEOUS | Status: AC
  Administered 2020-06-25: 1 via TOPICAL
  Filled 2020-06-25: qty 7

## 2020-06-25 NOTE — Plan of Care (Signed)
  Problem: Clinical Measurements: Goal: Respiratory complications will improve Outcome: Progressing   Problem: Clinical Measurements: Goal: Cardiovascular complication will be avoided Outcome: Progressing   Problem: Elimination: Goal: Will not experience complications related to urinary retention Outcome: Progressing   Problem: Pain Managment: Goal: General experience of comfort will improve Outcome: Progressing   Problem: Safety: Goal: Ability to remain free from injury will improve Outcome: Progressing   Problem: Skin Integrity: Goal: Risk for impaired skin integrity will decrease Outcome: Progressing

## 2020-06-25 NOTE — Progress Notes (Signed)
PROGRESS NOTE    Shannon [name]  SAY:[id number] DOB: [birth year] DOA: 06/19/2020 PCP: [name], MD   Brief Narrative:  Shannon [name] is a 75 y.o. 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 Subsequently sustained a fall resulting in epidural hematoma, underwent reexploration and removal of hardware and evacuation of hematoma on October 30. Has been on a cervical collar since then and with plans to keep it on until mid January. Patient also with history of BPH, chronic indwelling Foley catheter, hematuria. He had a prolonged hospital stay last month and was actually discharged just a week ago to short-term rehab at a skilled nursing facility [facility]).  During his previous admission he had septic shock due to UTI and was also noted to have aspiration pneumonia. He was also found to be incidentally positive for COVID-19 at that time and received monoclonal antibody treatment. He was sent from the skilled nursing facility to the ER initially on December 24 for problems with his Foley catheter. The Foley was replaced at that time and he was sent back to the skilled nursing facility. He was sent back today to the ER due to hematuria. Patient was also complaining of lower abdominal pain.  He was also noted to have a cough. CT scan of the abdomen pelvis showed nonobstructing renal stones.  No concerning findings noted in the bladder.  However patient was noted to have ill-defined collection of air overlying the coccyx in the lower sacrum.  He was given broad-spectrum antibiotics and will be hospitalized for further management.  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 Walden Field with urology contacted - indicates occasional hematuria is not uncommon with indwelling Foley, will evaluate for other secondary causes, we appreciate their insight and recommendations - Foley intially removed as holiday, poor urine output/obstruction - Coude catheter placed by urology team 12/29 - - Urine culture positive for achromobacter as above -patient to completed antibiotic course 12/31  - Multiple episodes of hematuria over the past few months resolving with bladder irrigation - No structural lesions identified on CT scan  Sacral wound with unlikely superficial infection, POA:  - Sx following, appreciate insight and recommendations; upon reevaluation sharp debridement for this was continued aggressive and hydrotherapy was initiated instead - Patient tolerated hydrotherapy 06/22/2020 quite well, per hydrotherapy team and wound care team given marked improvement and wound appearance with hydrotherapy alone they further recommend only routine wound care dressings at this point.  Given this likely is not a site for ongoing infection will shorten antibiotic course to cover UTI only as above  RESOLVED COVID-19 infection, questionable, without hypoxia, POA:  - Remove precautions as of 06/22/20 - Patient was incidentally found to be positive for COVID-19 before he was discharged to skilled nursing facility - positive back on June 06, 2020 - S/P monoclonal antibodies at that time.   - CRP mild and downtrending without treatment (likely 2/2 UTI) - Cough is chronic in the setting of dysphagia/quadriplegia (which has resolved with dietary changes) -  no indication to continue covid precautions given clinical picture and now 16 days after incidental covid.  History of dysphagia and recent  aspiration pneumonia, chronic cough, resolving:  - Most likely due to his cervical spine issues.   - He has a cervical collar.   - Diet per SLP - cough resolved with diet/feeding improvement  Quadriplegia secondary to cervical myelopathy:  - He underwent C-spine surgery on 10/13 - Subsequently sustained a fall resulting in epidural hematoma requiring surgical evacuation of the hematoma and removal of hardware.  - He has a cervical collar in place.  Followed by Dr. Arnoldo Morale with neurosurgery.  - Continue with cervical collar for now.  - Plan is to have it removed by Dr. Arnoldo Morale in mid January.  Bilateral nephrolithiasis:  - Noted on CT scan.  No evidence for obstruction.  Incidental cholelithiasis:  - Noted on CT scan.  No evidence for acute cholecystitis.  LFTs are normal.  Chronic hypotension:  - Continue with midodrine.  Seems to be stable currently.  Anemia of chronic disease/vitamin B12 deficiency/acute blood loss anemia - Hemoglobin stable  History of BPH:  - Continue Flomax.  Goals of care: Patient with steady decline over the last few months.  Seen by palliative care during his previous hospitalization.  Patient and family wanted to continue with full scope of care.    DVT prophylaxis: SCDs Code Status: Full - continue to discuss goals of care as above   Status is: Inpt  Dispo: The patient is from: SNF              Anticipated d/c is to: SNF              Anticipated d/c date is: 24-48 hours              Patient currently not medically stable- needs safe d/c- await SNF placement  Consultants:   Urology  general surgery  Palliative care  Procedures:   Hydrotherapy 06/22/20 x1  Antimicrobials:  Cefepime, Flagyl 12/28 - 12/29 Vancomycin 12/28 -  12/30 Meropenem 12/29 - 12/31  Subjective: Waiting on phone call from daughter  Objective: Vitals:   06/24/20 0628 06/24/20 1308 06/24/20 2103 06/25/20 0641  BP: 105/72 106/67 107/69 120/71  Pulse: 90  100 (!) 101 (!) 102  Resp: 16 17 20 20   Temp: 98.7 F (37.1 C) 98.8 F (37.1 C) 99.2 F (37.3 C) 98.7 F (37.1 C)  TempSrc: Oral  Oral Oral  SpO2: 95% 95% 96% 96%  Weight:      Height:        Intake/Output Summary (Last 24 hours) at 06/25/2020 1059 Last data filed at 06/25/2020 1035 Gross per 24 hour  Intake 1314.86 ml  Output 2400 ml  Net -1085.14 ml   Filed Weights   06/19/20 1126  Weight: 106 kg    Examination:   General: Appearance:    Frail male in no acute distress- laying in bed     Lungs:     Clear to auscultation bilaterally, respirations unlabored  Heart:    Tachycardic. Normal rhythm. No murmurs, rubs, or gallops.   MS:   Has quadriplegia   Neurologic:   Awake, alert- pleasant   Data Reviewed: I have personally reviewed following labs and imaging studies  CBC: Recent Labs  Lab 06/19/20 1312 06/20/20 0348 06/21/20 0337 06/22/20 0349 06/23/20 0401 06/24/20 0306 06/25/20 0304  WBC 12.5*   < > 10.9* 8.8 9.7 8.1 9.2  NEUTROABS 9.5*  --   --   --   --   --   --  HGB 9.0*   < > 7.9* 8.0* 8.2* 8.6* 8.9*  HCT 29.3*   < > 26.3* 26.6* 26.9* 28.1* 29.1*  MCV 97.0   < > 97.8 98.5 98.5 97.6 97.0  PLT 237   < > 170 169 177 173 174   < > = values in this interval not displayed.   Basic Metabolic Panel: Recent Labs  Lab 06/20/20 0348 06/21/20 0337 06/22/20 0349 06/23/20 0401 06/24/20 0306 06/25/20 0304  NA 142 139 139 138 136 137  K 3.2* 3.3* 3.4* 3.7 3.6 3.5  CL 105 105 106 106 104 103  CO2 27 25 28 27 27 28   GLUCOSE 85 157* 112* 125* 118* 104*  BUN 11 12 12 15 15 9   CREATININE 0.47* 0.53* 0.49* 0.44* 0.39* 0.35*  CALCIUM 8.2* 8.0* 7.9* 7.9* 7.8* 8.0*  MG 1.8  --   --   --   --   --    GFR: Estimated Creatinine Clearance: 102 mL/min (A) (by C-G formula based on SCr of 0.35 mg/dL (L)). Liver Function Tests: Recent Labs  Lab 06/21/20 0337 06/22/20 0349 06/23/20 0401 06/24/20 0306 06/25/20 0304  AST 22 15 17 16 19   ALT 12 11 9 11 12   ALKPHOS  63 58 66 74 81  BILITOT 0.4 0.5 0.5 0.4 0.4  PROT 5.5* 5.3* 5.1* 5.1* 5.1*  ALBUMIN 1.8* 1.7* 1.6* 1.6* 1.6*   No results for input(s): LIPASE, AMYLASE in the last 168 hours. No results for input(s): AMMONIA in the last 168 hours. Coagulation Profile: Recent Labs  Lab 06/19/20 1312 06/20/20 0348  INR 1.3* 1.4*   Cardiac Enzymes: No results for input(s): CKTOTAL, CKMB, CKMBINDEX, TROPONINI in the last 168 hours. BNP (last 3 results) No results for input(s): PROBNP in the last 8760 hours. HbA1C: No results for input(s): HGBA1C in the last 72 hours. CBG: No results for input(s): GLUCAP in the last 168 hours. Lipid Profile: No results for input(s): CHOL, HDL, LDLCALC, TRIG, CHOLHDL, LDLDIRECT in the last 72 hours. Thyroid Function Tests: No results for input(s): TSH, T4TOTAL, FREET4, T3FREE, THYROIDAB in the last 72 hours. Anemia Panel: No results for input(s): VITAMINB12, FOLATE, FERRITIN, TIBC, IRON, RETICCTPCT in the last 72 hours. Sepsis Labs: No results for input(s): PROCALCITON, LATICACIDVEN in the last 168 hours.  Recent Results (from the past 240 hour(s))  Urine culture     Status: None   Collection Time: 06/16/20  4:00 AM   Specimen: Urine, Random  Result Value Ref Range Status   Specimen Description   Final    URINE, RANDOM Performed at [facility], [address] [place], [place]    Special Requests   Final    NONE Performed at [facility], [address] [place], [place]    Culture   Final    NO GROWTH Performed at [facility], [address], [place], [address] [facility]    Report Status 06/17/2020 FINAL  Final  Urine culture     Status: Abnormal   Collection Time: 06/19/20  2:14 PM   Specimen: Urine, Random  Result Value Ref Range Status   Specimen Description   Final    URINE, RANDOM Performed at [facility], [address] [place], [place]    Special  Requests   Final    NONE Performed at [facility], [address], [place], [place]    Culture >=100,000 COLONIES/mL ACHROMOBACTER  XYLOSOXIDANS (A)  Final   Report Status 06/21/2020 FINAL  Final   Organism ID, Bacteria ACHROMOBACTER XYLOSOXIDANS (A)  Final      Susceptibility   Achromobacter xylosoxidans - MIC*    CEFAZOLIN >=64 RESISTANT Resistant     GENTAMICIN >=16 RESISTANT Resistant     CIPROFLOXACIN >=4 RESISTANT Resistant     IMIPENEM 4 SENSITIVE Sensitive     TRIMETH/SULFA <=20 SENSITIVE Sensitive     * >=100,000 COLONIES/mL ACHROMOBACTER XYLOSOXIDANS     Radiology Studies: No results found.  Scheduled Meds: . Chlorhexidine Gluconate Cloth  6 each Topical Daily  . docusate sodium  100 mg Oral BID  . feeding supplement  237 mL Oral TID BM  . ferrous sulfate  325 mg Oral BID WC  . mouth rinse  15 mL Mouth Rinse BID  . midodrine  10 mg Oral BID WC  . multivitamin with minerals  1 tablet Oral Daily  . nutrition supplement (JUVEN)  1 packet Oral BID WC  . pantoprazole  40 mg Oral QHS  . polyethylene glycol  17 g Oral BID  . tamsulosin  0.4 mg Oral Daily   Continuous Infusions: . lactated ringers 50 mL/hr at 06/25/20 0314  . meropenem (MERREM) IV 1 g (06/25/20 0315)     LOS: 6 days   Time spent:  35 min  Joseph Art, DO [facility]  If 7PM-7AM, please contact night-coverage [email or web address]  06/25/2020, 10:59 AM

## 2020-06-25 NOTE — Plan of Care (Signed)
  Problem: Elimination: Goal: Will not experience complications related to urinary retention Outcome: Progressing  Chronic foley for retention

## 2020-06-25 NOTE — Progress Notes (Signed)
   06/25/20 2252  Provider Notification  Provider Name/Title Blount NP  Date Provider Notified 06/25/20  Time Provider Notified 2250  Notification Type Page  Notification Reason Change in status (pulse 108)  Response  (await)  Rapid Response Notification  Name of Rapid Response RN Notified Erin RN  Date Rapid Response Notified 06/25/20  Time Rapid Response Notified 2251

## 2020-06-25 NOTE — TOC Progression Note (Signed)
Transition of Care [facility]) - Progression Note    Patient Details  Name: Shannon [name] MRN: [id number] Date of Birth: 02-27-1946  Transition of Care [facility]) CM/SW Contact  Marina Goodell Phone Number: [phone] 06/25/2020, 9:44 AM  Clinical Narrative:     CSW reached out to [facility] to confirm patient can return when he is ready for d/c.  I was transferred to the RN at [facility] three times but no one picked up the call.  I will try again later today.  Attending would like an update for placement confirmation.  Expected Discharge Plan: Skilled Nursing Facility Barriers to Discharge: Continued Medical Work up  Expected Discharge Plan and Services Expected Discharge Plan: Skilled Nursing Facility In-house Referral: Clinical Social Work     Living arrangements for the past 2 months: Skilled Nursing Facility                 DME Arranged: N/A DME Agency: NA                   Social Determinants of Health (SDOH) Interventions    Readmission Risk Interventions No flowsheet data found.

## 2020-06-26 DIAGNOSIS — G959 Disease of spinal cord, unspecified: Secondary | ICD-10-CM | POA: Diagnosis not present

## 2020-06-26 DIAGNOSIS — Z978 Presence of other specified devices: Secondary | ICD-10-CM | POA: Diagnosis not present

## 2020-06-26 DIAGNOSIS — N390 Urinary tract infection, site not specified: Secondary | ICD-10-CM | POA: Diagnosis not present

## 2020-06-26 DIAGNOSIS — R319 Hematuria, unspecified: Secondary | ICD-10-CM | POA: Diagnosis not present

## 2020-06-26 DIAGNOSIS — L89154 Pressure ulcer of sacral region, stage 4: Secondary | ICD-10-CM

## 2020-06-26 LAB — RESP PANEL BY RT-PCR (FLU A&B, COVID) ARPGX2
Influenza A by PCR: NEGATIVE
Influenza B by PCR: NEGATIVE
SARS Coronavirus 2 by RT PCR: POSITIVE — AB

## 2020-06-26 NOTE — Progress Notes (Signed)
No new change in [place] NP on call aware of last pulse at 108, pulse 101

## 2020-06-26 NOTE — Progress Notes (Signed)
°   06/26/20 0535  Vitals  Temp 98.8 F (37.1 C)  Temp Source Oral  BP 102/62  MAP (mmHg) 74  BP Location Right Arm  BP Method Automatic  Patient Position (if appropriate) Lying  Pulse Rate 98  Resp 16  MEWS COLOR  MEWS Score Color Green  Oxygen Therapy  SpO2 95 %  O2 Device Room Air  Pt now green

## 2020-06-26 NOTE — Discharge Summary (Signed)
Physician Discharge Summary  Shannon [name] GEZ:[id number] DOB: July 25, 1945 DOA: 06/19/2020  PCP: Shannon Massed, MD  Admit date: 06/19/2020 Discharge date: 06/26/2020  Discharge disposition: Skilled nursing facility   Recommendations for Outpatient Follow-Up:   Follow-up with physician at the nursing home within 3 days of discharge Follow-up at the wound care [name] for regular wound care.   Discharge Diagnosis:   Principal Problem:   Hematuria Active Problems:   Cervical myelopathy (HCC)   Chronic indwelling Foley catheter   Protein-calorie malnutrition, severe   Acute lower UTI   Sacral decubitus ulcer   COVID-19 virus infection 06/06/2020   UTI (urinary tract infection)    Discharge Condition: Stable.  Diet recommendation:  Diet Order            Diet - low sodium heart healthy           Diet regular Room service appropriate? Yes; Fluid consistency: Thin  Diet effective now                   Code Status: Full Code     Hospital Course:   Mr. 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 Subsequently sustained a fall resulting in epidural hematoma, underwent reexploration and removal of hardware and evacuation of hematoma on October 30. Has been on a cervical collar since then and with plans to keep it on until mid January. Patient also with history of BPH, chronic indwelling Foley catheter, hematuria. He had a prolonged hospital stay last month and was actually discharged just a week ago to short-term rehab at a skilled nursing facility [name]). During his previous admission he had septic shock due to UTI and was also noted to have aspiration pneumonia. He was also found to be incidentally positive for COVID-19 at that time and received monoclonal antibody treatment. He was sent from the skilled nursing facility to the ER initially on  December 24 for problems with his Foley catheter.The Foley was replaced at that time and he was sent back to the skilled nursing facility. He was sent back today to the ER due to hematuria. Patient was also complaining of lower abdominal pain. He was also noted to have a cough. CT scan of the abdomen pelvis showed nonobstructing renal stones. No concerning findings noted in the bladder. However, patient was noted to have ill-defined collection of air overlying the coccyx in the lower sacrum.        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 Walden [name] with urology contacted - indicates occasional hematuria is not uncommon with indwelling Foley, will evaluate for other secondary causes, we appreciate their insight and recommendations - Foley intially removed as holiday, poor urine output/obstruction - Coude catheter placed by urology team 12/29 - - Urine culture positive for achromobacter as above was treated with IV meropenem he has completed 6 days of treatment. - Multiple episodes of hematuria over the past few months resolving with bladder irrigation - No structural lesions identified on CT scan  Sacral wound with unlikely superficial infection, POA:  - Sx following, appreciate insight and recommendations; upon reevaluation sharp debridement for this was continued aggressive and hydrotherapy was initiated instead - Patient tolerated hydrotherapy 06/22/2020 quite well, per hydrotherapy team and wound care team given marked improvement and wound  appearance with hydrotherapy alone they further recommend only routine wound care dressings at this point.    RESOLVED COVID-19 infection, questionable, without hypoxia, POA:  - Remove precautions as of 06/22/20 - Patient was incidentally found to be positive for COVID-19 before he was discharged to skilled  nursing facility - positive back on June 06, 2020 - S/P monoclonal antibodies at that time.    History of dysphagia and recent aspiration pneumonia, chronic cough, resolving:  - Most likely due to his cervical spine issues.  - He has a cervical collar.  - Diet per SLP - cough resolved with diet/feeding improvement  Quadriplegia secondary to cervical myelopathy:  - He underwent C-spine surgery on 10/13 - Subsequently sustained a fall resulting in epidural hematoma requiring surgical evacuation of the hematoma and removal of hardware.  - He has a cervical collar in place. Followed by Dr. Arnoldo [name] with neurosurgery.  - Continue with cervical collar for now.  - Plan is to have it removed by Dr. Arnoldo [name] in mid January.  Bilateral nephrolithiasis:  - Noted on CT scan. No evidence forobstruction.  Incidental cholelithiasis:  - Noted on CT scan. No evidence for acute cholecystitis. LFTs are normal.  Chronic hypotension:  - Continue with midodrine. Seems to be stable currently.  Anemia of chronic disease/vitamin B12 deficiency/acute blood loss anemia - Hemoglobin stable  History of BPH:  - Continue Flomax.     Medical Consultants:    General surgeon  Urologist   Discharge Exam:    Vitals:   06/25/20 1409 06/25/20 2150 06/26/20 0434 06/26/20 0535  BP: 118/72 94/62 96/64  102/62  Pulse: (!) 104 (!) 108 (!) 101 98  Resp: 20  16 16   Temp: 98.6 F (37 C) 98.5 F (36.9 C) 98.7 F (37.1 C) 98.8 F (37.1 C)  TempSrc: Oral Oral Oral Oral  SpO2: 97% 99% 97% 95%  Weight:      Height:           GEN: NAD SKIN: Stage IV sacral decubitus wound EYES: EOMI ENT: MMM CV: RRR PULM: CTA B ABD: soft, ND, NT, +BS CNS: AAO x 3, quadriplegic EXT: No edema or tenderness GU: Foley catheter in place   The results of significant diagnostics from this hospitalization (including imaging, microbiology, ancillary and laboratory) are listed below for reference.      Procedures and Diagnostic Studies:   CT ABDOMEN PELVIS WO CONTRAST  Result Date: 06/19/2020 CLINICAL DATA:  Abdominal pain, hematuria since Thursday. Suprapubic catheter in place. RIGHT lower quadrant abdominal pain. COVID positive. EXAM: CT ABDOMEN AND PELVIS WITHOUT CONTRAST TECHNIQUE: Multidetector CT imaging of the abdomen and pelvis was performed following the standard protocol without IV contrast. COMPARISON:  Chest CT dated 05/03/2020. FINDINGS: Lower chest: Patchy small consolidations at the bilateral lung bases, RIGHT greater than LEFT. RIGHT pleural effusion, small to moderate in size. Hepatobiliary: No focal liver abnormality is seen. 1.1 cm gallstone. Gallbladder appears otherwise unremarkable. No bile duct dilatation is seen. Pancreas: Diffusely infiltrated with fat but otherwise unremarkable. No peripancreatic fluid. Spleen: Normal in size without focal abnormality. Adrenals/Urinary Tract: Adrenal glands are unremarkable. 2 LEFT renal stones, each measuring 2-3 mm, nonobstructive. Single 3 mm nonobstructing RIGHT renal stone. No hydronephrosis is seen bilaterally. No ureteral or bladder calculi are identified. Bladder is decompressed by Foley catheter. Contents of the bladder are hyperdense, presumably blood products. Stomach/Bowel: No dilated large or small bowel loops. Diverticulosis of the sigmoid and descending colon but no focal inflammatory changes seen to suggest  acute diverticulitis. Moderate amount of stool and gas in the ascending and transverse colon. Stomach is unremarkable, partially decompressed. Vascular/Lymphatic: Aortic atherosclerosis. No enlarged lymph nodes are seen within the abdomen or pelvis. Reproductive: Prostate is unremarkable. Other: No free fluid or abscess collection is seen. No free intraperitoneal air. Musculoskeletal: No acute or suspicious osseous finding. Degenerative spondylosis at the L1-2 and L2-3 levels, moderate in degree with associated disc space  narrowing and osseous spurring. Visualized soft tissues of the abdominal wall are unremarkable. Gas-like collection overlying the coccyx and lower sacrum. IMPRESSION: 1. Patchy small consolidations at the bilateral lung bases, RIGHT greater than LEFT, suggesting COVID-19 pneumonia. RIGHT pleural effusion, small to moderate in size. 2. Bladder is decompressed by Foley catheter. Contents of the bladder are hyperdense, presumably blood products. 3. Bilateral nephrolithiasis (largest stone measuring 3 mm). No ureteral or bladder calculi. No hydronephrosis. 4. Colonic diverticulosis without evidence of acute diverticulitis. 5. Cholelithiasis without evidence of acute cholecystitis. 6. Ill-defined collection of air overlying the coccyx and lower sacrum, possibly superficial to the patient but concerning for sequela of sacral decubitus ulcer or other infectious process of the soft tissues overlying the sacrum/coccyx. Clinical correlation recommended. Aortic Atherosclerosis (ICD10-I70.0). Electronically Signed   By: Bary Richard M.D.   On: 06/19/2020 15:49   DG Chest Port 1 View  Result Date: 06/19/2020 CLINICAL DATA:  cough EXAM: PORTABLE CHEST 1 VIEW COMPARISON:  06/06/2020 and prior. FINDINGS: Decreased bibasilar opacities and small pleural effusions. No pneumothorax. Cardiomediastinal silhouette within normal limits. Left shoulder osteoarthrosis. IMPRESSION: Decreased bibasilar opacities and small pleural effusions. Electronically Signed   By: Stana Bunting M.D.   On: 06/19/2020 14:40     Labs:   Basic Metabolic Panel: Recent Labs  Lab 06/20/20 0348 06/21/20 0337 06/22/20 0349 06/23/20 0401 06/24/20 0306 06/25/20 0304  NA 142 139 139 138 136 137  K 3.2* 3.3* 3.4* 3.7 3.6 3.5  CL 105 105 106 106 104 103  CO2 27 25 28 27 27 28   GLUCOSE 85 157* 112* 125* 118* 104*  BUN 11 12 12 15 15 9   CREATININE 0.47* 0.53* 0.49* 0.44* 0.39* 0.35*  CALCIUM 8.2* 8.0* 7.9* 7.9* 7.8* 8.0*  MG 1.8  --   --    --   --   --    GFR Estimated Creatinine Clearance: 102 mL/min (A) (by C-G formula based on SCr of 0.35 mg/dL (L)). Liver Function Tests: Recent Labs  Lab 06/21/20 0337 06/22/20 0349 06/23/20 0401 06/24/20 0306 06/25/20 0304  AST 22 15 17 16 19   ALT 12 11 9 11 12   ALKPHOS 63 58 66 74 81  BILITOT 0.4 0.5 0.5 0.4 0.4  PROT 5.5* 5.3* 5.1* 5.1* 5.1*  ALBUMIN 1.8* 1.7* 1.6* 1.6* 1.6*   No results for input(s): LIPASE, AMYLASE in the last 168 hours. No results for input(s): AMMONIA in the last 168 hours. Coagulation profile Recent Labs  Lab 06/19/20 1312 06/20/20 0348  INR 1.3* 1.4*    CBC: Recent Labs  Lab 06/19/20 1312 06/20/20 0348 06/21/20 0337 06/22/20 0349 06/23/20 0401 06/24/20 0306 06/25/20 0304  WBC 12.5*   < > 10.9* 8.8 9.7 8.1 9.2  NEUTROABS 9.5*  --   --   --   --   --   --   HGB 9.0*   < > 7.9* 8.0* 8.2* 8.6* 8.9*  HCT 29.3*   < > 26.3* 26.6* 26.9* 28.1* 29.1*  MCV 97.0   < > 97.8 98.5 98.5 97.6 97.0  PLT 237   < > 170 169 177 173 174   < > = values in this interval not displayed.   Cardiac Enzymes: No results for input(s): CKTOTAL, CKMB, CKMBINDEX, TROPONINI in the last 168 hours. BNP: Invalid input(s): POCBNP CBG: No results for input(s): GLUCAP in the last 168 hours. D-Dimer No results for input(s): DDIMER in the last 72 hours. Hgb A1c No results for input(s): HGBA1C in the last 72 hours. Lipid Profile No results for input(s): CHOL, HDL, LDLCALC, TRIG, CHOLHDL, LDLDIRECT in the last 72 hours. Thyroid function studies No results for input(s): TSH, T4TOTAL, T3FREE, THYROIDAB in the last 72 hours.  Invalid input(s): FREET3 Anemia work up No results for input(s): VITAMINB12, FOLATE, FERRITIN, TIBC, IRON, RETICCTPCT in the last 72 hours. Microbiology Recent Results (from the past 240 hour(s))  Urine culture     Status: Abnormal   Collection Time: 06/19/20  2:14 PM   Specimen: Urine, Random  Result Value Ref Range Status   Specimen  Description   Final    URINE, RANDOM Performed at [facility], [address] [place], [place] [address]    Special Requests   Final    NONE Performed at [name], [address] [place], [place] [address]    Culture >=100,000 COLONIES/mL ACHROMOBACTER XYLOSOXIDANS (A)  Final   Report Status 06/21/2020 FINAL  Final   Organism ID, Bacteria ACHROMOBACTER XYLOSOXIDANS (A)  Final      Susceptibility   Achromobacter xylosoxidans - MIC*    CEFAZOLIN >=64 RESISTANT Resistant     GENTAMICIN >=16 RESISTANT Resistant     CIPROFLOXACIN >=4 RESISTANT Resistant     IMIPENEM 4 SENSITIVE Sensitive     TRIMETH/SULFA <=20 SENSITIVE Sensitive     * >=100,000 COLONIES/mL ACHROMOBACTER XYLOSOXIDANS     Discharge Instructions:   Discharge Instructions    Diet - low sodium heart healthy   Complete by: As directed    Discharge wound care:   Complete by: As directed    Dry dressing to sacral wound BID and change if soiled with bowel movements. Pressure off load with air mattress and frequent turns. Would also recommend follow up at the wound care [name].   Increase activity slowly   Complete by: As directed      Allergies as of 06/26/2020      Reactions   Iodine Swelling   Penicillins Swelling   ** tolerates cephalosporins Facial swelling, itchy throat      Medication List    STOP taking these medications   oxyCODONE 5 MG immediate release tablet Commonly known as: Oxy IR/ROXICODONE     TAKE these medications   acetaminophen 500 MG tablet Commonly known as: TYLENOL Take 1,000 mg by mouth 3 (three) times daily.   albuterol 108 (90 Base) MCG/ACT inhaler Commonly known as: VENTOLIN HFA Inhale 2 puffs into the lungs daily.   ascorbic acid 500 MG tablet Commonly known as: VITAMIN C Take 1,000 mg by mouth 3 (three) times daily.   aspirin 325 MG tablet Take 325 mg by mouth every morning.   docusate sodium 100 MG capsule Commonly known as:  COLACE Take 1 capsule (100 mg total) by mouth 2 (two) times daily.   feeding supplement Liqd Take 237 mLs by mouth 4 (four) times daily.   ferrous sulfate 325 (65 FE) MG tablet Take 1 tablet (325 mg total) by mouth 2 (two) times daily with a meal.   midodrine 10 MG tablet Commonly known  as: PROAMATINE Take 1 tablet (10 mg total) by mouth 2 (two) times daily with a meal.   pantoprazole 40 MG tablet Commonly known as: PROTONIX Take 1 tablet (40 mg total) by mouth at bedtime.   polyethylene glycol 17 g packet Commonly known as: MIRALAX / GLYCOLAX Take 17 g by mouth every morning. Mix in 6 oz water and drink   scopolamine 1 MG/3DAYS Commonly known as: TRANSDERM-SCOP Place 1 patch onto the skin every 3 (three) days.   tamsulosin 0.4 MG Caps capsule Commonly known as: FLOMAX Take 1 capsule (0.4 mg total) by mouth daily.            Discharge Care Instructions  (From admission, onward)         Start     Ordered   06/26/20 0000  Discharge wound care:       Comments: Dry dressing to sacral wound BID and change if soiled with bowel movements. Pressure off load with air mattress and frequent turns. Would also recommend follow up at the wound care [name].   06/26/20 1301            Time coordinating discharge: 35 minutes  Signed:  Amdrew Oboyle  [facility] 06/26/2020, 1:01 PM   Pager on [email or web address]. If 7PM-7AM, please contact night-coverage at [email or web address]

## 2020-06-26 NOTE — Progress Notes (Signed)
Progress Note  4 Days Post-Op  Subjective: Patient reports pain with turning. Having bowel function. No other acute changes.  Objective: Vital signs in last 24 hours: Temp:  [98.5 F (36.9 C)-98.8 F (37.1 C)] 98.8 F (37.1 C) (01/03 0535) Pulse Rate:  [98-108] 98 (01/03 0535) Resp:  [16-20] 16 (01/03 0535) BP: (94-118)/(62-72) 102/62 (01/03 0535) SpO2:  [95 %-99 %] 95 % (01/03 0535) Last BM Date: 06/25/20  Intake/Output from previous day: 01/02 0701 - 01/03 0700 In: 1595.6 [P.O.:890; I.V.:305.6; IV Piggyback:400] Out: 2200 [Urine:2200] Intake/Output this shift: Total I/O In: 100 [P.O.:100] Out: 350 [Urine:350]  PE: GU: patient with 3 decubitus ulcer, stage 1 decubitus over left superior buttock, small, clean ulcer above R buttock just lateral to midline, and a large ~10-14 cm sacral decubitus ulcer with fibrinous exudate   Lab Results:  Recent Labs    06/24/20 0306 06/25/20 0304  WBC 8.1 9.2  HGB 8.6* 8.9*  HCT 28.1* 29.1*  PLT 173 174   BMET Recent Labs    06/24/20 0306 06/25/20 0304  NA 136 137  K 3.6 3.5  CL 104 103  CO2 27 28  GLUCOSE 118* 104*  BUN 15 9  CREATININE 0.39* 0.35*  CALCIUM 7.8* 8.0*   PT/INR No results for input(s): LABPROT, INR in the last 72 hours. CMP     Component Value Date/Time   NA 137 06/25/2020 0304   K 3.5 06/25/2020 0304   CL 103 06/25/2020 0304   CO2 28 06/25/2020 0304   GLUCOSE 104 (H) 06/25/2020 0304   BUN 9 06/25/2020 0304   CREATININE 0.35 (L) 06/25/2020 0304   CALCIUM 8.0 (L) 06/25/2020 0304   PROT 5.1 (L) 06/25/2020 0304   ALBUMIN 1.6 (L) 06/25/2020 0304   AST 19 06/25/2020 0304   ALT 12 06/25/2020 0304   ALKPHOS 81 06/25/2020 0304   BILITOT 0.4 06/25/2020 0304   GFRNONAA >60 06/25/2020 0304   GFRAA >60 01/09/2020 1317   Lipase  No results found for: LIPASE     Studies/Results: No results found.  Anti-infectives: Anti-infectives (From admission, onward)   Start     Dose/Rate Route  Frequency Ordered Stop   06/21/20 2000  meropenem (MERREM) 1 g in sodium chloride 0.9 % 100 mL IVPB        1 g 200 mL/hr over 30 Minutes Intravenous Every 8 hours 06/21/20 1308     06/20/20 1000  vancomycin (VANCOREADY) IVPB 1250 mg/250 mL  Status:  Discontinued        1,250 mg 166.7 mL/hr over 90 Minutes Intravenous Every 12 hours 06/19/20 1807 06/22/20 1206   06/20/20 0200  metroNIDAZOLE (FLAGYL) IVPB 500 mg  Status:  Discontinued        500 mg 100 mL/hr over 60 Minutes Intravenous Every 8 hours 06/19/20 1745 06/21/20 1311   06/20/20 0200  ceFEPIme (MAXIPIME) 2 g in sodium chloride 0.9 % 100 mL IVPB  Status:  Discontinued        2 g 200 mL/hr over 30 Minutes Intravenous Every 8 hours 06/19/20 1807 06/21/20 1304   06/19/20 1700  vancomycin (VANCOREADY) IVPB 2000 mg/400 mL        2,000 mg 200 mL/hr over 120 Minutes Intravenous  Once 06/19/20 1646 06/19/20 2355   06/19/20 1700  metroNIDAZOLE (FLAGYL) IVPB 500 mg        500 mg 100 mL/hr over 60 Minutes Intravenous  Once 06/19/20 1646 06/19/20 2123   06/19/20 1700  ceFEPIme (MAXIPIME) 2  g in sodium chloride 0.9 % 100 mL IVPB        2 g 200 mL/hr over 30 Minutes Intravenous  Once 06/19/20 1649 06/19/20 1808       Assessment/Plan Recurrent hematuria/UTI  Quadriplegia  Chronic indwelling foley  COVID 19 - tested positive Dec 14, now off precautions  PMH dysphagia PMH aspiration  Anemia of chronic disease Chronic hypotension   Necrotic sacral decubitus ulcer  - some fibrinous necrotic tissue present, no signs of infection  - recommend dry dressing to help mechanically debride - could have hydrotherapy come back and re-evaluate wound for debridement - unfortunately, this wound is likely to never fully heal given that patient is quadriplegic and bedbound - no indication for surgical debridement at this time. We can follow peripherally and see 1-2x weekly while admitted.   FEN: reg diet VTE: ok to have chemical VTE prophylaxis from  a surgical standpoint ID: no current abx indicated  LOS: 7 days    Juliet Rude , [facility] 06/26/2020, 9:26 AM Please see Amion for pager number during day hours 7:00am-4:30pm

## 2020-06-26 NOTE — TOC Transition Note (Signed)
Transition of Care [facility]) - CM/SW Discharge Note   Patient Details  Name: Kameren Pargas MRN: [id number] Date of Birth: 07-17-1945  Transition of Care [facility]) CM/SW Contact:  Amada Jupiter, LCSW Phone Number: 06/26/2020, 2:11 PM   Clinical Narrative:     Pt medically cleared today for return to [facility]. Pt, daughter and niece aware.  RN to call report to [phone].  [facility] to transport.   Final next level of care: Skilled Nursing Facility Barriers to Discharge: Barriers Resolved   Patient Goals and CMS Choice        Discharge Placement   Existing PASRR number confirmed : 06/21/20          Patient chooses bed at: [facility] Patient to be transferred to facility by: [facility] Name of family member notified: daughter, [name] and niece Patient and family notified of of transfer: 06/26/20  Discharge Plan and Services In-house Referral: Clinical Social Work              DME Arranged: N/A DME Agency: NA                  Social Determinants of Health (SDOH) Interventions     Readmission Risk Interventions No flowsheet data found.

## 2020-06-26 NOTE — TOC Progression Note (Signed)
Transition of Care [facility]) - Progression Note    Patient Details  Name: [name] MRN: [id number] Date of Birth: 1945/08/11  Transition [facility]) CM/SW Contact  Amada Jupiter, [place] Phone Number: 06/26/2020, 11:25 AM  Clinical Narrative:    Alerted by MD that pt is medically ready for return to SNF.  Have notified pt, daughter and niece and all in agreement with return to [facility].  Facility is aware and ready for readmit, however, we need a result COVID test prior to dc- orders placed.  Hopefully, can get pt back to facility today.   Expected Discharge Plan: Skilled Nursing Facility Barriers to Discharge: Continued Medical Work up  Expected Discharge Plan and Services Expected Discharge Plan: Skilled Nursing Facility In-house Referral: Clinical Social Work     Living arrangements for the past 2 months: Skilled Nursing Facility                 DME Arranged: N/A DME Agency: NA                   Social Determinants of Health (SDOH) Interventions    Readmission Risk Interventions No flowsheet data found.

## 2020-06-26 NOTE — Progress Notes (Signed)
   06/26/20 0434  Vitals  Temp 98.7 F (37.1 C)  Temp Source Oral  BP 96/64  MAP (mmHg) 74  BP Location Right Arm  BP Method Automatic  Patient Position (if appropriate) Lying  Pulse Rate (!) 101  Resp 16  MEWS COLOR  MEWS Score Color Yellow  Oxygen Therapy  SpO2 97 %  O2 Device Room Air

## 2020-06-26 NOTE — Care Management Important Message (Signed)
Important Message  Patient Details IM Letter given to the Patient. Name: Saintclair Schroader MRN: [id number] Date of Birth: April 28, 1946   Medicare Important Message Given:  Yes     Caren Macadam 06/26/2020, 2:54 PM

## 2020-06-26 NOTE — [place] FL2 (Signed)
[place] MEDICAID FL2 LEVEL OF CARE SCREENING TOOL     IDENTIFICATION  Patient Name: Shannon [name] Birthdate: 1945-10-10 Sex: male Admission Date (Current Location): 06/19/2020  [facility] and [place] Number:  Producer, television/film/video and Address:  [facility], [address] [place], [place] [address]      Provider Number: [id number]  Attending Physician Name and Address:  Lurene Shadow, MD  Relative Name and Phone Number:  Gucciardo,[name] Daughter   [phone]    Current Level of Care: Hospital Recommended Level of Care: Skilled Nursing Facility Prior Approval Number:    Date Approved/Denied:   PASRR Number: [id number]  Discharge Plan: SNF    Current Diagnoses: Patient Active Problem List   Diagnosis Date Noted  . Hematuria 06/19/2020  . Acute lower UTI 06/19/2020  . Sacral decubitus ulcer 06/19/2020  . COVID-19 virus infection 06/06/2020 06/19/2020  . UTI (urinary tract infection) 06/19/2020  . Palliative care by specialist   . Goals of care, counseling/discussion   . Respiration abnormal   . Respiratory distress   . Protein-calorie malnutrition, severe 05/05/2020  . Pressure injury of skin 05/04/2020  . Pneumonia 05/03/2020  . Sepsis secondary to UTI (HCC) 05/03/2020  . Urinary retention 05/03/2020  . Chronic indwelling Foley catheter 05/03/2020  . 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 Myelopathy (HCC) 04/22/2020  . Quadriplegia and quadriparesis (HCC)   . Acute blood loss anemia   . Slow transit constipation   . Essential hypertension   . Cervical myelopathy (HCC) 04/07/2020  . Quadriparesis (HCC)   . Benign essential HTN   . Post-operative pain   . Neuropathic pain   . Asthma   . 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    Orientation RESPIRATION BLADDER Height & Weight     Self,Time,Situation,Place  Normal Continent,Indwelling catheter Weight: 233 lb 11 oz (106 kg) Height:  6'  (182.9 cm)  BEHAVIORAL SYMPTOMS/MOOD NEUROLOGICAL BOWEL NUTRITION STATUS      Continent    AMBULATORY STATUS COMMUNICATION OF NEEDS Skin   Total Care Verbally Other (Comment) (3 decubitis ulcers over bottom)                       Personal Care Assistance Level of Assistance  Bathing,Feeding,Dressing Bathing Assistance: Maximum assistance Feeding assistance: Maximum assistance Dressing Assistance: Maximum assistance     Functional Limitations Info  Sight,Hearing,Speech Sight Info: Adequate Hearing Info: Adequate Speech Info: Adequate    SPECIAL CARE FACTORS FREQUENCY  PT (By licensed PT),OT (By licensed OT)     PT Frequency: 5x/wk OT Frequency: 5x/wk            Contractures Contractures Info: Not present    Additional Factors Info  Code Status,Allergies,Isolation Precautions Code Status Info: Full Allergies Info: see MAR     Isolation Precautions Info: COVID +     Current Medications (06/26/2020):  This is the current hospital active medication list Current Facility-Administered Medications  Medication Dose Route Frequency Provider Last Rate Last Admin  . acetaminophen (TYLENOL) tablet 650 mg  650 mg Oral Q6H PRN Osvaldo Shipper, MD       Or  . acetaminophen (TYLENOL) suppository 650 mg  650 mg Rectal Q6H PRN Osvaldo Shipper, MD      . Chlorhexidine Gluconate Cloth 2 % PADS 6 each  6 each Topical Daily Osvaldo Shipper, MD   6 each at 06/25/20 [id number]  . docusate sodium (COLACE) capsule 100 mg  100  mg Oral BID Osvaldo Shipper, MD   100 mg at 06/25/20 0857  . feeding supplement (ENSURE SURGERY) liquid 237 mL  237 mL Oral TID BM Azucena Fallen, MD   237 mL at 06/24/20 2006  . ferrous sulfate tablet 325 mg  325 mg Oral BID WC Osvaldo Shipper, MD   325 mg at 06/26/20 0801  . MEDLINE mouth rinse  15 mL Mouth Rinse BID Osvaldo Shipper, MD   15 mL at 06/26/20 1142  . midodrine (PROAMATINE) tablet 10 mg  10 mg Oral BID WC Osvaldo Shipper, MD   10 mg at 06/26/20 0801   . multivitamin with minerals tablet 1 tablet  1 tablet Oral Daily Azucena Fallen, MD   1 tablet at 06/26/20 1142  . nutrition supplement (JUVEN) (JUVEN) powder packet 1 packet  1 packet Oral BID WC Azucena Fallen, MD   1 packet at 06/26/20 0802  . ondansetron (ZOFRAN) tablet 4 mg  4 mg Oral Q6H PRN Osvaldo Shipper, MD       Or  . ondansetron [facility]) injection 4 mg  4 mg Intravenous Q6H PRN Osvaldo Shipper, MD      . pantoprazole (PROTONIX) EC tablet 40 mg  40 mg Oral QHS Osvaldo Shipper, MD   40 mg at 06/25/20 2200  . polyethylene glycol (MIRALAX / GLYCOLAX) packet 17 g  17 g Oral BID Osvaldo Shipper, MD   17 g at 06/25/20 0858  . sodium phosphate (FLEET) 7-19 GM/118ML enema 1 enema  1 enema Rectal Once PRN Osvaldo Shipper, MD      . tamsulosin [facility]) capsule 0.4 mg  0.4 mg Oral Daily Osvaldo Shipper, MD   0.4 mg at 06/26/20 1142     Discharge Medications: Please see discharge summary for a list of discharge medications.  Relevant Imaging Results:  Relevant Lab Results:   Additional Information [id number]  Brittinee Risk, LCSW

## 2020-06-27 NOTE — Plan of Care (Signed)
Reviewed

## 2020-06-27 NOTE — Progress Notes (Signed)
Patient discharged to [facility] via [facility].  Report given to [facility] staff, VS obtained and stable, (see flowsheet).

## 2020-06-30 ENCOUNTER — Emergency Department (HOSPITAL_COMMUNITY): Payer: Medicare Other

## 2020-06-30 ENCOUNTER — Inpatient Hospital Stay (HOSPITAL_COMMUNITY)
Admission: EM | Admit: 2020-06-30 | Discharge: 2020-07-11 | DRG: 698 | Disposition: A | Discharge status: Skilled Nursing Facility | Payer: Medicare Other | Source: Skilled Nursing Facility | Attending: Internal Medicine | Admitting: Internal Medicine

## 2020-06-30 DIAGNOSIS — L89154 Pressure ulcer of sacral region, stage 4: Secondary | ICD-10-CM | POA: Diagnosis present

## 2020-06-30 DIAGNOSIS — U071 COVID-19: Secondary | ICD-10-CM | POA: Diagnosis not present

## 2020-06-30 DIAGNOSIS — T17998A Other foreign object in respiratory tract, part unspecified causing other injury, initial encounter: Secondary | ICD-10-CM | POA: Diagnosis present

## 2020-06-30 DIAGNOSIS — M4628 Osteomyelitis of vertebra, sacral and sacrococcygeal region: Secondary | ICD-10-CM | POA: Diagnosis present

## 2020-06-30 DIAGNOSIS — D509 Iron deficiency anemia, unspecified: Secondary | ICD-10-CM | POA: Diagnosis present

## 2020-06-30 DIAGNOSIS — E43 Unspecified severe protein-calorie malnutrition: Secondary | ICD-10-CM

## 2020-06-30 DIAGNOSIS — Z79899 Other long term (current) drug therapy: Secondary | ICD-10-CM

## 2020-06-30 DIAGNOSIS — D638 Anemia in other chronic diseases classified elsewhere: Secondary | ICD-10-CM | POA: Diagnosis present

## 2020-06-30 DIAGNOSIS — Z888 Allergy status to other drugs, medicaments and biological substances status: Secondary | ICD-10-CM

## 2020-06-30 DIAGNOSIS — E876 Hypokalemia: Secondary | ICD-10-CM | POA: Diagnosis not present

## 2020-06-30 DIAGNOSIS — E870 Hyperosmolality and hypernatremia: Secondary | ICD-10-CM | POA: Diagnosis not present

## 2020-06-30 DIAGNOSIS — A4159 Other Gram-negative sepsis: Secondary | ICD-10-CM | POA: Diagnosis present

## 2020-06-30 DIAGNOSIS — J90 Pleural effusion, not elsewhere classified: Secondary | ICD-10-CM

## 2020-06-30 DIAGNOSIS — I10 Essential (primary) hypertension: Secondary | ICD-10-CM | POA: Diagnosis present

## 2020-06-30 DIAGNOSIS — E538 Deficiency of other specified B group vitamins: Secondary | ICD-10-CM | POA: Diagnosis present

## 2020-06-30 DIAGNOSIS — R319 Hematuria, unspecified: Secondary | ICD-10-CM | POA: Diagnosis present

## 2020-06-30 DIAGNOSIS — R1310 Dysphagia, unspecified: Secondary | ICD-10-CM | POA: Diagnosis present

## 2020-06-30 DIAGNOSIS — N390 Urinary tract infection, site not specified: Secondary | ICD-10-CM | POA: Diagnosis present

## 2020-06-30 DIAGNOSIS — D62 Acute posthemorrhagic anemia: Secondary | ICD-10-CM | POA: Diagnosis present

## 2020-06-30 DIAGNOSIS — R0602 Shortness of breath: Secondary | ICD-10-CM

## 2020-06-30 DIAGNOSIS — J690 Pneumonitis due to inhalation of food and vomit: Secondary | ICD-10-CM | POA: Diagnosis present

## 2020-06-30 DIAGNOSIS — N3001 Acute cystitis with hematuria: Secondary | ICD-10-CM

## 2020-06-30 DIAGNOSIS — Z8616 Personal history of COVID-19: Secondary | ICD-10-CM

## 2020-06-30 DIAGNOSIS — Z981 Arthrodesis status: Secondary | ICD-10-CM

## 2020-06-30 DIAGNOSIS — M19042 Primary osteoarthritis, left hand: Secondary | ICD-10-CM | POA: Diagnosis present

## 2020-06-30 DIAGNOSIS — Z87891 Personal history of nicotine dependence: Secondary | ICD-10-CM

## 2020-06-30 DIAGNOSIS — G959 Disease of spinal cord, unspecified: Secondary | ICD-10-CM | POA: Diagnosis present

## 2020-06-30 DIAGNOSIS — J9601 Acute respiratory failure with hypoxia: Secondary | ICD-10-CM | POA: Diagnosis present

## 2020-06-30 DIAGNOSIS — R64 Cachexia: Secondary | ICD-10-CM | POA: Diagnosis present

## 2020-06-30 DIAGNOSIS — E878 Other disorders of electrolyte and fluid balance, not elsewhere classified: Secondary | ICD-10-CM | POA: Diagnosis not present

## 2020-06-30 DIAGNOSIS — Z1624 Resistance to multiple antibiotics: Secondary | ICD-10-CM | POA: Diagnosis present

## 2020-06-30 DIAGNOSIS — R338 Other retention of urine: Secondary | ICD-10-CM | POA: Diagnosis present

## 2020-06-30 DIAGNOSIS — M19041 Primary osteoarthritis, right hand: Secondary | ICD-10-CM | POA: Diagnosis present

## 2020-06-30 DIAGNOSIS — R627 Adult failure to thrive: Secondary | ICD-10-CM | POA: Diagnosis present

## 2020-06-30 DIAGNOSIS — S14109S Unspecified injury at unspecified level of cervical spinal cord, sequela: Secondary | ICD-10-CM | POA: Diagnosis not present

## 2020-06-30 DIAGNOSIS — Z8249 Family history of ischemic heart disease and other diseases of the circulatory system: Secondary | ICD-10-CM

## 2020-06-30 DIAGNOSIS — W19XXXS Unspecified fall, sequela: Secondary | ICD-10-CM | POA: Diagnosis present

## 2020-06-30 DIAGNOSIS — R0600 Dyspnea, unspecified: Secondary | ICD-10-CM

## 2020-06-30 DIAGNOSIS — Y846 Urinary catheterization as the cause of abnormal reaction of the patient, or of later complication, without mention of misadventure at the time of the procedure: Secondary | ICD-10-CM | POA: Diagnosis present

## 2020-06-30 DIAGNOSIS — N400 Enlarged prostate without lower urinary tract symptoms: Secondary | ICD-10-CM | POA: Diagnosis present

## 2020-06-30 DIAGNOSIS — N319 Neuromuscular dysfunction of bladder, unspecified: Secondary | ICD-10-CM | POA: Diagnosis present

## 2020-06-30 DIAGNOSIS — Z6831 Body mass index (BMI) 31.0-31.9, adult: Secondary | ICD-10-CM | POA: Diagnosis not present

## 2020-06-30 DIAGNOSIS — Z809 Family history of malignant neoplasm, unspecified: Secondary | ICD-10-CM

## 2020-06-30 DIAGNOSIS — G8250 Quadriplegia, unspecified: Secondary | ICD-10-CM | POA: Diagnosis present

## 2020-06-30 DIAGNOSIS — I509 Heart failure, unspecified: Secondary | ICD-10-CM

## 2020-06-30 DIAGNOSIS — T83511A Infection and inflammatory reaction due to indwelling urethral catheter, initial encounter: Secondary | ICD-10-CM | POA: Diagnosis present

## 2020-06-30 DIAGNOSIS — R1319 Other dysphagia: Secondary | ICD-10-CM

## 2020-06-30 DIAGNOSIS — Z880 Allergy status to penicillin: Secondary | ICD-10-CM

## 2020-06-30 DIAGNOSIS — L89159 Pressure ulcer of sacral region, unspecified stage: Secondary | ICD-10-CM | POA: Diagnosis present

## 2020-06-30 DIAGNOSIS — Z7982 Long term (current) use of aspirin: Secondary | ICD-10-CM

## 2020-06-30 DIAGNOSIS — Z811 Family history of alcohol abuse and dependence: Secondary | ICD-10-CM

## 2020-06-30 DIAGNOSIS — I959 Hypotension, unspecified: Secondary | ICD-10-CM | POA: Diagnosis present

## 2020-06-30 DIAGNOSIS — Z833 Family history of diabetes mellitus: Secondary | ICD-10-CM

## 2020-06-30 LAB — URINALYSIS, ROUTINE W REFLEX MICROSCOPIC

## 2020-06-30 LAB — PROCALCITONIN: Procalcitonin: <0.1 ng/mL

## 2020-06-30 LAB — URINALYSIS, MICROSCOPIC (REFLEX)
RBC / HPF: >50 RBC/hpf (ref 0–5)
Squamous Epithelial / HPF: NONE SEEN (ref 0–5)

## 2020-06-30 LAB — CBC WITH DIFFERENTIAL/PLATELET
Abs Immature Granulocytes: 0.09 10*3/uL — ABNORMAL HIGH (ref 0.00–0.07)
Basophils Absolute: 0.1 10*3/uL (ref 0.0–0.1)
Basophils Relative: 1 %
Eosinophils Absolute: 0.4 10*3/uL (ref 0.0–0.5)
Eosinophils Relative: 4 %
HCT: 33 % — ABNORMAL LOW (ref 39.0–52.0)
Hemoglobin: 9.9 g/dL — ABNORMAL LOW (ref 13.0–17.0)
Immature Granulocytes: 1 %
Lymphocytes Relative: 12 %
Lymphs Abs: 1.1 10*3/uL (ref 0.7–4.0)
MCH: 28.8 pg (ref 26.0–34.0)
MCHC: 30 g/dL (ref 30.0–36.0)
MCV: 95.9 fL (ref 80.0–100.0)
Monocytes Absolute: 0.7 10*3/uL (ref 0.1–1.0)
Monocytes Relative: 7 %
Neutro Abs: 7.4 10*3/uL (ref 1.7–7.7)
Neutrophils Relative %: 75 %
Platelets: 258 10*3/uL (ref 150–400)
RBC: 3.44 MIL/uL — ABNORMAL LOW (ref 4.22–5.81)
RDW: 14.6 % (ref 11.5–15.5)
WBC: 9.7 10*3/uL (ref 4.0–10.5)
nRBC: 0 % (ref 0.0–0.2)

## 2020-06-30 LAB — COMPREHENSIVE METABOLIC PANEL
ALT: 10 U/L (ref 0–44)
AST: 20 U/L (ref 15–41)
Albumin: 1.7 g/dL — ABNORMAL LOW (ref 3.5–5.0)
Alkaline Phosphatase: 98 U/L (ref 38–126)
Anion gap: 8 (ref 5–15)
BUN: 8 mg/dL (ref 8–23)
CO2: 28 mmol/L (ref 22–32)
Calcium: 8.1 mg/dL — ABNORMAL LOW (ref 8.9–10.3)
Chloride: 104 mmol/L (ref 98–111)
Creatinine, Ser: 0.42 mg/dL — ABNORMAL LOW (ref 0.61–1.24)
GFR, Estimated: >60 mL/min (ref >60)
Glucose, Bld: 101 mg/dL — ABNORMAL HIGH (ref 70–99)
Potassium: 3.5 mmol/L (ref 3.5–5.1)
Sodium: 140 mmol/L (ref 135–145)
Total Bilirubin: 0.9 mg/dL (ref 0.3–1.2)
Total Protein: 6.2 g/dL — ABNORMAL LOW (ref 6.5–8.1)

## 2020-06-30 LAB — APTT: aPTT: 35 seconds (ref 24–36)

## 2020-06-30 LAB — PROTIME-INR
INR: 1.2 (ref 0.8–1.2)
Prothrombin Time: 14.5 seconds (ref 11.4–15.2)

## 2020-06-30 LAB — BRAIN NATRIURETIC PEPTIDE: B Natriuretic Peptide: 35.9 pg/mL (ref 0.0–100.0)

## 2020-06-30 LAB — LACTIC ACID, PLASMA: Lactic Acid, Venous: 1.3 mmol/L (ref 0.5–1.9)

## 2020-06-30 LAB — LIPASE, BLOOD: Lipase: 13 U/L (ref 11–51)

## 2020-06-30 IMAGING — DX DG CHEST 1V PORT
1 series · 1 of 1 positions shown · non-contrast
Comparison: Single-view of the chest [DATE] and [DATE].

CLINICAL DATA: [YZ] positive patient with productive cough.

EXAM:
PORTABLE CHEST 1 VIEW

[chest ap]
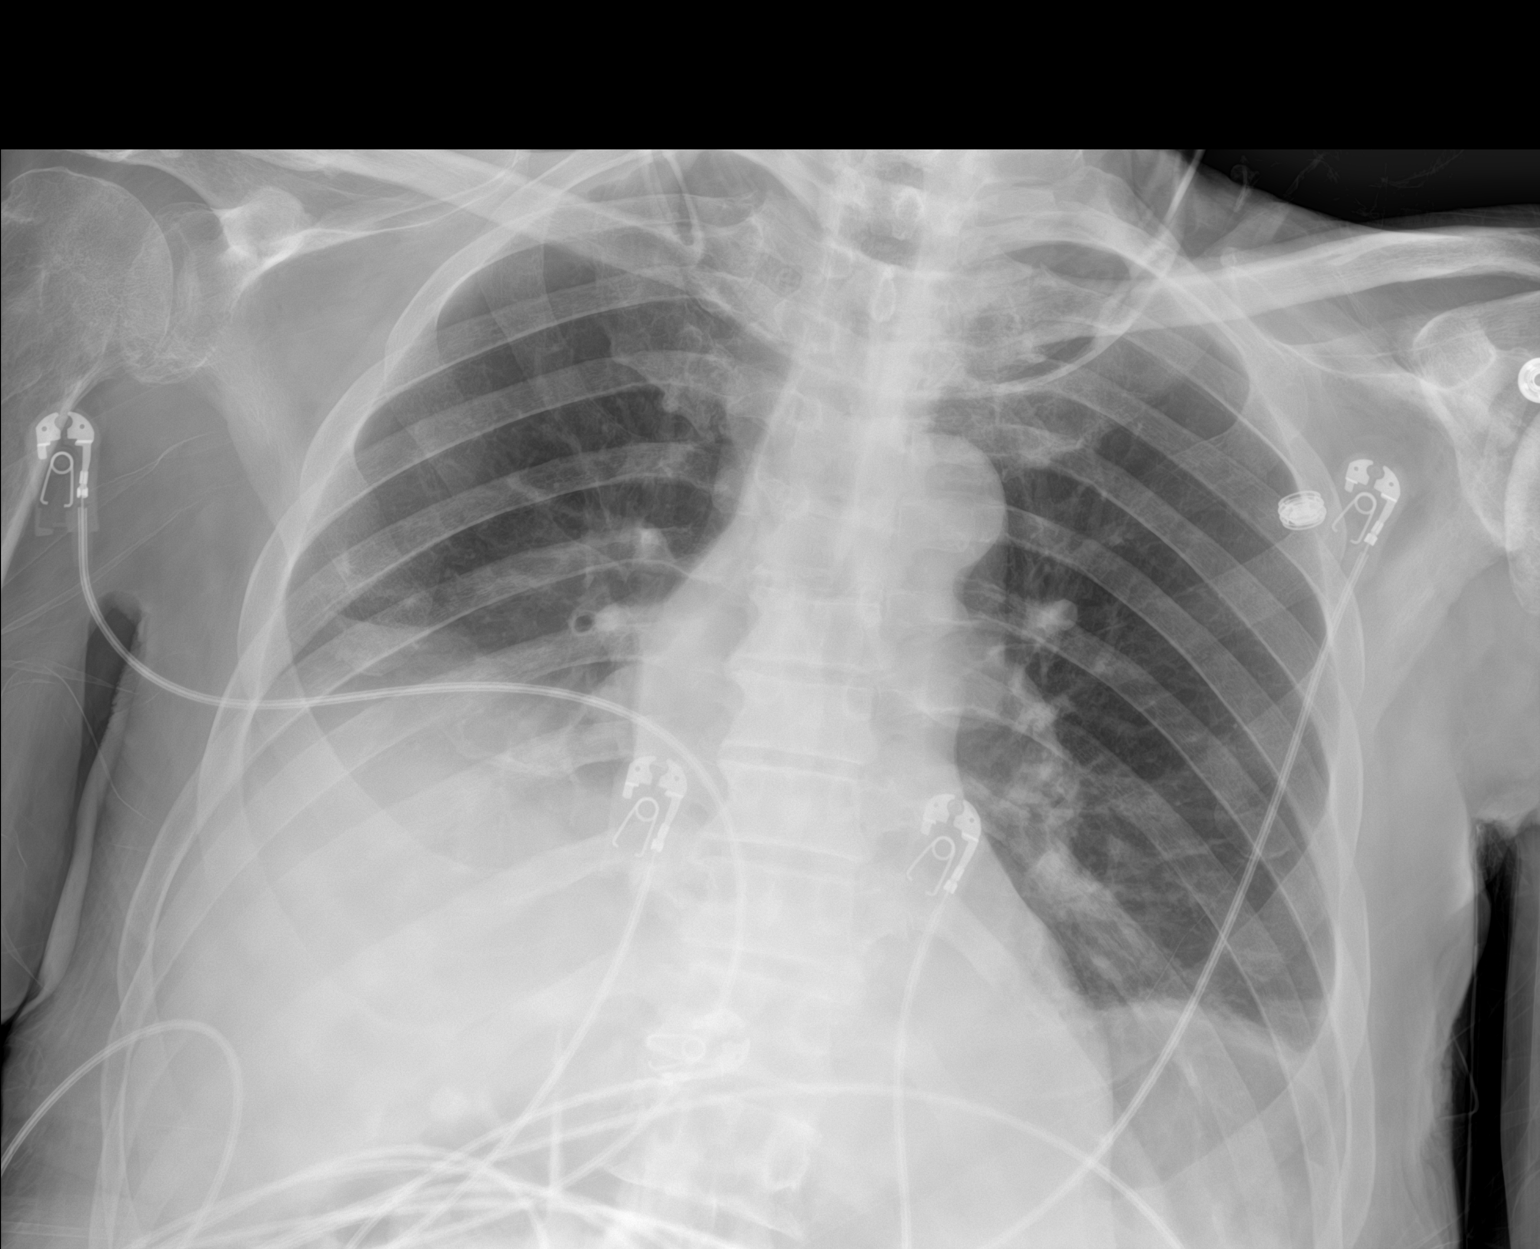

[1 of 1 positions shown; findings below may reference images not displayed]

FINDINGS: Right pleural effusion and basilar airspace disease have markedly
worsened. The patient has a very small left pleural effusion and
mild basilar airspace disease. No pneumothorax. Heart size normal.
IMPRESSION: Marked worsening of a right pleural effusion which appears moderate
in size and right basilar airspace disease. Trace left pleural
effusion is noted.

## 2020-06-30 MED ORDER — ALBUTEROL SULFATE HFA 108 (90 BASE) MCG/ACT IN AERS
1.0000 | INHALATION_SPRAY | Freq: Four times a day (QID) | RESPIRATORY_TRACT | Status: DC | PRN
  Filled 2020-06-30: qty 6.7

## 2020-06-30 MED ORDER — ONDANSETRON HCL 4 MG PO TABS: 4.0000 mg | ORAL_TABLET | Freq: Four times a day (QID) | ORAL | Status: DC | PRN

## 2020-06-30 MED ORDER — LACTATED RINGERS IV BOLUS (SEPSIS): 500.0000 mL | Freq: Once | INTRAVENOUS | Status: DC

## 2020-06-30 MED ORDER — SODIUM CHLORIDE 0.9 % IV SOLN: INTRAVENOUS | Status: DC

## 2020-06-30 MED ORDER — SODIUM CHLORIDE 0.9 % IR SOLN: 3000.0000 mL | Status: DC

## 2020-06-30 MED ORDER — ACETAMINOPHEN 500 MG PO TABS
1000.0000 mg | ORAL_TABLET | Freq: Once | ORAL | Status: DC
  Filled 2020-06-30: qty 2

## 2020-06-30 MED ORDER — SODIUM CHLORIDE 0.9 % IV SOLN
2.0000 g | Freq: Once | INTRAVENOUS | Status: AC
  Administered 2020-06-30: 2 g via INTRAVENOUS
  Filled 2020-06-30: qty 2

## 2020-06-30 MED ORDER — SODIUM CHLORIDE 0.9 % IV SOLN
2.0000 g | Freq: Three times a day (TID) | INTRAVENOUS | Status: AC
  Administered 2020-06-30 – 2020-07-06 (×18): 2 g via INTRAVENOUS
  Filled 2020-06-30 (×21): qty 2

## 2020-06-30 MED ORDER — ACETAMINOPHEN 650 MG RE SUPP
650.0000 mg | Freq: Four times a day (QID) | RECTAL | Status: DC | PRN
  Administered 2020-06-30: 650 mg via RECTAL
  Filled 2020-06-30: qty 1

## 2020-06-30 MED ORDER — LACTATED RINGERS IV SOLN: INTRAVENOUS | Status: DC

## 2020-06-30 MED ORDER — ONDANSETRON HCL 4 MG/2ML IJ SOLN: 4.0000 mg | Freq: Four times a day (QID) | INTRAMUSCULAR | Status: DC | PRN

## 2020-06-30 MED ORDER — LACTATED RINGERS IV BOLUS (SEPSIS): 1000.0000 mL | Freq: Once | INTRAVENOUS | Status: DC

## 2020-06-30 MED ORDER — FUROSEMIDE 10 MG/ML IJ SOLN
40.0000 mg | INTRAMUSCULAR | Status: AC
  Administered 2020-06-30: 40 mg via INTRAVENOUS
  Filled 2020-06-30: qty 4

## 2020-06-30 MED ORDER — SODIUM CHLORIDE 0.9% FLUSH
3.0000 mL | Freq: Two times a day (BID) | INTRAVENOUS | Status: DC
  Administered 2020-06-30 – 2020-07-11 (×22): 3 mL via INTRAVENOUS

## 2020-06-30 MED ORDER — ALBUTEROL SULFATE (2.5 MG/3ML) 0.083% IN NEBU: 2.5000 mg | INHALATION_SOLUTION | Freq: Four times a day (QID) | RESPIRATORY_TRACT | Status: DC | PRN

## 2020-06-30 NOTE — ED Provider Notes (Signed)
[name] Provider Note   CSN: [id number] Arrival date & time: 06/30/20  4403     History Chief Complaint  Patient presents with  . Hematuria    Sh[name] is a 75 y.o. male.  The history is provided by the patient, a relative and medical records. No language interpreter was used.  Hematuria     74 year old male significant history of quadriplegia due to cervical myelopathy status post C-spine surgery last year, recently diagnosed with COVID-19 infection several days prior, having chronic indwelling Foley catheter and hematuria secondary to chronic indwelling Foley and concurrent Achromobacter xylosoxidans UTI treated with IV meropenem for 6 days who was brought here from [facility] for report of blood in the Foley cath.  History obtained through patient and through niece who is at bedside.  Patient previously admitted to the hospital on 06/19/2020 and was discharged on 06/26/2020 for hematuria, asymptomatic Covid, and infection from his chronic indwelling Foley.  He also has a sacral wound that was being cared for.  He was placed on meropenem for 6 days and was discharged back to the facility.  Staff noticed that patient developed hematuria again this morning.  Furthermore he has had a wet cough for the past several days yesterday his cough did improve but today worsened.  At this time patient without any significant complaint aside from his recurrent cough.  Patient was found to have an O2 sats in the 80s, improved with 2 L of supplemental oxygen.  He does not complain of any significant pain.  He has been vaccinated for COVID-19.  Patient is quadriplegia is resultant of a fall in October of last year.  Past Medical History:  Diagnosis Date  . Asthma   . 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 Newell Coral, 1992-->surg at [facility]  Summer 2021->Cervicalgia,arm pain,  hand atrophy RUE wkness, hyperreflex (Dr. 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 Hay fever   . Hypertension   . Osteoarthritis of both hands     Patient Active Problem List   Diagnosis Date Noted  . Hematuria 06/19/2020  . Acute lower UTI 06/19/2020  . Sacral decubitus ulcer 06/19/2020  . COVID-19 virus infection 06/06/2020 06/19/2020  . UTI (urinary tract infection) 06/19/2020  . Palliative care by specialist   . Goals of care, counseling/discussion   . Respiration abnormal   . Respiratory distress   . Protein-calorie malnutrition, severe 05/05/2020  . Pressure injury of skin 05/04/2020  . Pneumonia 05/03/2020  . Sepsis secondary to UTI (HCC) 05/03/2020  . Urinary retention 05/03/2020  . Chronic indwelling Foley catheter 05/03/2020  . 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 Myelopathy (HCC) 04/22/2020  . Quadriplegia and quadriparesis (HCC)   . Acute blood loss anemia   . Slow transit constipation   . Essential hypertension   . Cervical myelopathy (HCC) 04/07/2020  . Quadriparesis (HCC)   . Benign essential HTN   . Post-operative pain   . Neuropathic pain   . Asthma   . 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 ANTERIOR CERVICAL DECOMP/DISCECTOMY FUSION N/A 04/05/2020   Procedure: ANTERIOR CERVICAL DECOMPRESSION/DISCECTOMY FUSION, INTERBODY PROSTHESIS, PLATE/SCREWS CERVICAL THREE-CERVICAL FOUR, CERVICAL FOUR- CERVICAL FIVE;  Surgeon: Tressie Stalker, MD;  Location[name];  Service: Neurosurgery;  Laterality: N/A;  .  ANTERIOR CERVICAL DECOMP/DISCECTOMY FUSION N/A 04/22/2020   Procedure: Reexploration of anterior cervical wound for epidural hematoma;  Surgeon: Donalee Citrin, MD;  Location: [name] OR;  Service: Neurosurgery;  Laterality: N/A;  . BACK SURGERY    . INCISION AND DRAINAGE Left 2012   L hand infection  . ROTATOR  CUFF REPAIR Right    x 3  . SPINE SURGERY         Family History  Problem Relation Age of Onset  . Heart attack Mother   . Heart disease Mother   . High blood pressure Mother   . Alcohol abuse Father   . Cancer Father   . Diabetes Father   . High blood pressure Father     Social History   Tobacco Use  . Smoking status: Former Games developer  . Smokeless tobacco: Never Used  Vaping Use  . Vaping Use: Never used  Substance Use Topics  . Alcohol use: Not Currently  . Drug use: Never    Home Medications Prior to Admission medications   Medication Sig Start Date End Date Taking? Authorizing Provider  acetaminophen (TYLENOL) 500 MG tablet Take 1,000 mg by mouth 3 (three) times daily.     [provider]  albuterol (VENTOLIN HFA) 108 (90 Base) MCG/ACT inhaler Inhale 2 puffs into the lungs daily.    [provider]  ascorbic acid (VITAMIN C) 500 MG tablet Take 1,000 mg by mouth 3 (three) times daily.    [provider]  aspirin 325 MG tablet Take 325 mg by mouth every morning.    [provider]  docusate sodium (COLACE) 100 MG capsule Take 1 capsule (100 mg total) by mouth 2 (two) times daily. 06/12/20   Leroy Sea, MD  feeding supplement (ENSURE ENLIVE / ENSURE PLUS) LIQD Take 237 mLs by mouth 4 (four) times daily. 06/12/20   Leroy Sea, MD  ferrous sulfate 325 (65 FE) MG tablet Take 1 tablet (325 mg total) by mouth 2 (two) times daily with a meal. 06/06/20   Zannie Cove, MD  midodrine (PROAMATINE) 10 MG tablet Take 1 tablet (10 mg total) by mouth 2 (two) times daily with a meal. 06/06/20   Zannie Cove, MD  pantoprazole (PROTONIX) 40 MG tablet Take 1 tablet (40 mg total) by mouth at bedtime. 04/19/20   Love, [name], PA-C  polyethylene glycol (MIRALAX / GLYCOLAX) 17 g packet Take 17 g by mouth every morning. Mix in 6 oz water and drink    [provider]  scopolamine (TRANSDERM-SCOP) 1 MG/3DAYS Place 1 patch onto the  skin every 3 (three) days.    [provider]  tamsulosin (FLOMAX) 0.4 MG CAPS capsule Take 1 capsule (0.4 mg total) by mouth daily. 04/28/20   Tressie Stalker, MD    Allergies    Iodine and Penicillins  Review of Systems   Review of Systems  Genitourinary: Positive for hematuria.  All other systems reviewed and are negative.   Physical Exam Updated Vital Signs BP 140/78 (BP Location: Left Arm)   Pulse (!) 118   Temp 98.4 F (36.9 C) (Oral)   Resp 20   SpO2 94%   Physical Exam Vitals and nursing note reviewed. Exam conducted with a chaperone present.  Constitutional:      General: He is not in acute distress.    Appearance: He is well-developed and well-nourished.     Comments: Chronically ill-appearing male laying in bed with c-collar in place actively coughing wet sounding  cough  HENT:     Head: Atraumatic.     Mouth/Throat:     Mouth: Mucous membranes are dry.  Eyes:     Extraocular Movements: Extraocular movements intact.     Conjunctiva/sclera: Conjunctivae normal.     Pupils: Pupils are equal, round, and reactive to light.  Neck:     Comments: C-collar in place Cardiovascular:     Rate and Rhythm: Tachycardia present.     Pulses: Normal pulses.     Heart sounds: Normal heart sounds.  Pulmonary:     Breath sounds: Rales present.     Comments: Crackles heard throughout lung fields  There is a shallow ulceration to his right mid clavicular region with dressing in place. Abdominal:     Palpations: Abdomen is soft.     Tenderness: There is no abdominal tenderness.  Genitourinary:    Comments: Sacral ulcer with clean wet-to-dry dressing in place.  Well-appearing granular tissue noted.  Ulcers unstageable.  Indwelling Foley catheter with gross hematuria in Foley bag Musculoskeletal:        General: Swelling (Edema to bilateral upper extremities) present.     Cervical back: Neck supple.  Skin:    General: Skin is warm.     Findings: No rash.   Neurological:     Mental Status: He is alert and oriented to person, place, and time.     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  Psychiatric:        Mood and Affect: Mood and affect and mood normal.     ED Results / Procedures / Treatments   Labs (all labs ordered are listed, but only abnormal results are displayed) Labs Reviewed  CBC WITH DIFFERENTIAL/PLATELET - Abnormal; Notable for the following components:      Result Value   RBC 3.44 (*)    Hemoglobin 9.9 (*)    HCT 33.0 (*)    Abs Immature Granulocytes 0.09 (*)    All other components within normal limits  COMPREHENSIVE METABOLIC PANEL - Abnormal; Notable for the following components:   Glucose, Bld 101 (*)    Creatinine, Ser 0.42 (*)    Calcium 8.1 (*)    Total Protein 6.2 (*)    Albumin 1.7 (*)    All other components within normal limits  URINALYSIS, ROUTINE W REFLEX MICROSCOPIC - Abnormal; Notable for the following components:   Color, Urine BROWN (*)    APPearance TURBID (*)    Glucose, UA   (*)    Value: TEST NOT REPORTED DUE TO COLOR INTERFERENCE OF URINE PIGMENT   Hgb urine dipstick   (*)    Value: TEST NOT REPORTED DUE TO COLOR INTERFERENCE OF URINE PIGMENT   Bilirubin Urine   (*)    Value: TEST NOT REPORTED DUE TO COLOR INTERFERENCE OF URINE PIGMENT   Ketones, ur   (*)    Value: TEST NOT REPORTED DUE TO COLOR INTERFERENCE OF URINE PIGMENT   Protein, ur   (*)    Value: TEST NOT REPORTED DUE TO COLOR INTERFERENCE OF URINE PIGMENT   Nitrite   (*)    Value: TEST NOT REPORTED DUE TO COLOR INTERFERENCE OF URINE PIGMENT   Leukocytes,Ua   (*)    Value: TEST NOT REPORTED DUE TO COLOR INTERFERENCE OF URINE PIGMENT   All other components within normal limits  URINALYSIS, MICROSCOPIC (REFLEX) - Abnormal; Notable for the following components:   Bacteria, UA MANY (*)    All other components within normal  limits  CULTURE, BLOOD (SINGLE)  CULTURE, BLOOD (ROUTINE X 2)  CULTURE, BLOOD (ROUTINE X 2)   URINE CULTURE  LIPASE, BLOOD  LACTIC ACID, PLASMA  PROTIME-INR  APTT  BRAIN NATRIURETIC PEPTIDE    EKG None  ED ECG REPORT   Date: 06/30/2020  Rate: 110  Rhythm: sinus tachycardia  QRS Axis: left  Intervals: normal  ST/T Wave abnormalities: nonspecific T wave changes  Conduction Disutrbances:none  Narrative Interpretation:   Old EKG Reviewed: unchanged  I have personally reviewed the EKG tracing and agree with the computerized printout as noted.   Radiology DG Chest Portable 1 View  Result Date: 06/30/2020 CLINICAL DATA:  COVID-19 positive patient with productive cough. EXAM: PORTABLE CHEST 1 VIEW COMPARISON:  Single-view of the chest 06/19/2020 and 06/06/2020. FINDINGS: Right pleural effusion and basilar airspace disease have markedly worsened. The patient has a very small left pleural effusion and mild basilar airspace disease. No pneumothorax. Heart size normal. IMPRESSION: Marked worsening of a right pleural effusion which appears moderate in size and right basilar airspace disease. Trace left pleural effusion is noted. Electronically Signed   By: Drusilla Kanner M.D.   On: 06/30/2020 10:00    Procedures .Critical Care Performed by: Fayrene Helper, PA-C Authorized by: Fayrene Helper, PA-C   Critical care provider statement:    Critical care time (minutes):  65   Critical care was time spent personally by me on the following activities:  Discussions with consultants, evaluation of patient's response to treatment, examination of patient, ordering and performing treatments and interventions, ordering and review of laboratory studies, ordering and review of radiographic studies, pulse oximetry, re-evaluation of patient's condition, obtaining history from patient or surrogate and review of old charts   (including critical care time)  Medications Ordered in ED Medications  acetaminophen (TYLENOL) tablet 1,000 mg (1,000 mg Oral Not Given 06/30/20 1150)  meropenem (MERREM) 2 g in  sodium chloride 0.9 % 100 mL IVPB (2 g Intravenous New Bag/Given 06/30/20 1149)    ED Course  I have reviewed the triage vital signs and the nursing notes.  Pertinent labs & imaging results that were available during my care of the patient were reviewed by me and considered in my medical decision making (see chart for details).    MDM Rules/Calculators/A&P                          BP 115/71   Pulse (!) 119   Temp 98.4 F (36.9 C) (Oral)   Resp (!) 23   Ht 6' (1.829 m)   Wt 106 kg   SpO2 100%   BMI 31.69 kg/m   Final Clinical Impression(s) / ED Diagnoses Final diagnoses:  None    Rx / DC Orders ED Discharge Orders    None     10:21 AM Patient previously admitted to the hospital for hematuria and found to have UTI in the setting of chronic Foley.  He was also had asymptomatic Covid during his hospital admission.  He was discharged to skilled nursing facility 4 days ago but brought here because hematuria returns and patient now having a wet cough.  He is found to be tachycardic, warm to the touch, tachypneic, as well as hypoxic with O2 sats in the 80s on room air.  He has several potential source of infection which includes pulmonary source from potential aspiration pneumonia as patient is quadriplegic and has had aspiration in the past.  He also has  underlying Covid infection that was asymptomatic previously.  He also has hematuria and potential recurrence of his UTI.  He also has a sacral ulcer but it is well-appearing.  Code sepsis initiated, have discussed with our pharmacist to identify the most appropriate antibiotic to initiate in this patient.  A chest x-ray obtained shows marked worsening of right pleural effusion which appears moderate in size and right basilar airspace disease along with trace left pleural effusion.  Since patient has an impaired pulmonary status with evidence of pleural effusion and he is not hypotensive, I will not aggressively hydrate him as he is at high  risk of flash pulmonary edema. Care discussed with Dr. Alvino Chapel.   10:31 AM I have discussed with our pharmacist, Suezanne Jacquet, who recommend continue with meropenem as it will cover for patient's underlying UTI as well as potential pneumonia.  His sacral wound is well-appearing on exam and he recently did receive vancomycin about 3 days ago.  We felt the source of his infection is less likely to be his sacral wound which has been treated with hydrotherapy.  We will hold off on vancomycin at this time.  11:40 AM Nurse notified that patient sats dropped down to the 70s despite being on high flow oxygen when staff attempted to lay him down to check rectal temperature.  This is likely secondary to a mucous plug causing worsening hypoxia.  Respiratory therapist was immediately notified, and did perform nasotracheal suctioning and able to evacuate a mucous plugging sats did improved.  Patient appears more comfortable.  Will consult for admission  11:51 AM Appreciate consultation from [facility] Dr. Tamala Julian who agrees to see and admit pt for further care.  Please note, pt was not treated for COVID during last hospitalization as he was asymptomatic from that stand point.  I have not given remdesivir or prednisone.  I will defer further treatment to our hospitalist.    [name] was evaluated in Emergency Department on 06/30/2020 for the symptoms described in the history of present illness. He was evaluated in the context of the global COVID-19 pandemic, which necessitated consideration that the patient might be at risk for infection with the SARS-CoV-2 virus that causes COVID-19. Institutional protocols and algorithms that pertain to the evaluation of patients at risk for COVID-19 are in a state of rapid change based on information released by regulatory bodies including the CDC and federal and state organizations. These policies and algorithms were followed during the patient's care in the ED.    Domenic Moras, PA-C 06/30/20 1155    Davonna Belling, MD 06/30/20 [phone]

## 2020-06-30 NOTE — ED Notes (Signed)
Pt moved to low air loss, oscillating bed d/t wounds. Fresh brief/linens applied. Pt voices comfort

## 2020-06-30 NOTE — Sepsis Progress Note (Signed)
eLink monitoring this Code Sepsis. 

## 2020-06-30 NOTE — ED Notes (Signed)
Have requested supplies to initiate CBI. Once bladder irrigation supplies arrived to floor, will initiate ASAP

## 2020-06-30 NOTE — Progress Notes (Signed)
Pt on 6L [place] with spo2 84%. Pt with congested cough unable to clear secretions. NP, RN, tech at bedside. Pt placed on 8L HFNC and NTS x2 L nare with copious amounts of yellow thick secretions removed. Pt also has strong congested cough and RT was able to get catheter at back of throat and get copious amounts of secretions removed from there too. Pt unhappy about it but stated he does feel like it helped and does feel better. Spo2 improved to 100%. RT will closely monitor pt

## 2020-06-30 NOTE — H&P (Signed)
History and Physical    Shannon [name] GGY:[id number] DOB: Apr 04, 1946 DOA: 06/30/2020  Referring MD/NP/PA: Fayrene Helper, PA-C PCP: [name], MD  Patient coming from: [facility] via EMS  Chief Complaint: Cough and blood in urine  I have personally briefly reviewed patient's old medical records in [facility]   HPI: Shannon [name] is a 75 y.o. 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  He presents with complaints of cough and blood in his urine.  He had just recently been hospitalized from 12/272021-06/26/2020 with hematuria secondary to urinary tract infection.  Patient was evaluated by urology with a coud catheter placed on 12/29.  Cultures were positive for achromobacter and was treated with IV meropenem. He had completed 6 days of treatment prior to medication being stopped.  Since being at the rehab facility had initially doing okay and reported that urine was clear until yesterday.  He had received out patient hydrotherapy for the sacral wound.  Does complain that he has had a persistent productive cough with yellow sputum production.  Denies feeling like he has had to go down the wrong way.  He has been evaluated several times by speech therapy and had been advised that he could eat a regular diet with small bites.  ED Course: Upon admission into the emergency department patient was seen to be afebrile, pulse 111-132, respirations 18-29, O2 saturations as low as 79% with improvement after suctioning, and currently maintained on 8 L high flow nasal cannula oxygen.  Labs significant for hemoglobin 9.9, albumin 1.7, and lactic acid 1.3.  Urinalysis was significant for many  bacteria, RBCs greater than 50 RBCs, and 11-20 WBCs.  Chest x-ray significant for marked worsening of right pleural effusion with right basilar airspace disease, and trace left-sided pleural effusion.  Patient's nurse noted significantly wet cough after patient taking couple sips of water.  Patient has been started on meropenem  Review of Systems  Constitutional: Positive for malaise/fatigue. Negative for fever.  HENT: Negative for congestion and nosebleeds.   Eyes: Negative for photophobia and pain.  Respiratory: Positive for cough, sputum production and shortness of breath.   Cardiovascular: Negative for chest pain and leg swelling.  Gastrointestinal: Negative for blood in stool, nausea and vomiting.  Genitourinary: Positive for hematuria. Negative for dysuria.  Skin:       Positive for sacral wound  Neurological: Positive for weakness. Negative for loss of consciousness.  Psychiatric/Behavioral: Negative for substance abuse. The patient is not nervous/anxious.     Past Medical History:  Diagnosis Date  . Asthma   . 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 Newell Coral, 1992-->surg at [facility].  Summer 2021->Cervicalgia,arm pain, hand atrophy RUE wkness, hyperreflex (Dr. 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 Hay fever   . Hypertension   . Osteoarthritis of both hands     Past Surgical History:  Procedure Laterality Date  . ANTERIOR CERVICAL DECOMP/DISCECTOMY FUSION N/A 04/05/2020   Procedure: ANTERIOR CERVICAL DECOMPRESSION/DISCECTOMY FUSION, INTERBODY PROSTHESIS, PLATE/SCREWS CERVICAL THREE-CERVICAL FOUR, CERVICAL FOUR- CERVICAL FIVE;  Surgeon: Tressie Stalker, MD;  Location:  [facility] OR;  Service: Neurosurgery;  Laterality:  N/A;  . ANTERIOR CERVICAL DECOMP/DISCECTOMY FUSION N/A 04/22/2020   Procedure: Reexploration of anterior cervical wound for  epidural hematoma;  Surgeon: Donalee Citrin, MD;  Location: [facility] OR;  Service: Neurosurgery;  Laterality: N/A;  . BACK SURGERY    . INCISION AND DRAINAGE Left 2012   L hand infection  . ROTATOR CUFF REPAIR Right    x 3  . SPINE SURGERY       reports that he has quit smoking. He has never used smokeless tobacco. He reports previous alcohol use. He reports that he does not use drugs.  Allergies  Allergen Reactions  . Iodine Swelling  . Penicillins Swelling    ** tolerates cephalosporins Facial swelling, itchy throat    Family History  Problem Relation Age of Onset  . Heart attack Mother   . Heart disease Mother   . High blood pressure Mother   . Alcohol abuse Father   . Cancer Father   . Diabetes Father   . High blood pressure Father     Prior to Admission medications   Medication Sig Start Date End Date Taking? Authorizing Provider  acetaminophen (TYLENOL) 500 MG tablet Take 1,000 mg by mouth 3 (three) times daily.     [provider]  albuterol (VENTOLIN HFA) 108 (90 Base) MCG/ACT inhaler Inhale 2 puffs into the lungs daily.    [provider]  ascorbic acid (VITAMIN C) 500 MG tablet Take 1,000 mg by mouth 3 (three) times daily.    [provider]  aspirin 325 MG tablet Take 325 mg by mouth every morning.    [provider]  docusate sodium (COLACE) 100 MG capsule Take 1 capsule (100 mg total) by mouth 2 (two) times daily. 06/12/20   Leroy Sea, MD  feeding supplement (ENSURE ENLIVE / ENSURE PLUS) LIQD Take 237 mLs by mouth 4 (four) times daily. 06/12/20   Leroy Sea, MD  ferrous sulfate 325 (65 FE) MG tablet Take 1 tablet (325 mg total) by mouth 2 (two) times daily with a meal. 06/06/20   Zannie Cove, MD  midodrine (PROAMATINE) 10 MG tablet Take 1 tablet (10 mg total) by mouth 2 (two) times daily with a meal. 06/06/20   Zannie Cove, MD  pantoprazole (PROTONIX) 40 MG tablet Take 1 tablet (40 mg total) by mouth at bedtime.  04/19/20   Love, [name], PA-C  polyethylene glycol (MIRALAX / GLYCOLAX) 17 g packet Take 17 g by mouth every morning. Mix in 6 oz water and drink    [provider]  scopolamine (TRANSDERM-SCOP) 1 MG/3DAYS Place 1 patch onto the skin every 3 (three) days.    [provider]  tamsulosin (FLOMAX) 0.4 MG CAPS capsule Take 1 capsule (0.4 mg total) by mouth daily. 04/28/20   Tressie Stalker, MD    Physical Exam:  Constitutional: Cachectic and ill-appearing elderly male Vitals:   06/30/20 1130 06/30/20 1133 06/30/20 1143 06/30/20 1143  BP: 115/71     Pulse: (!) 132  (!) 119   Resp: 18  (!) 23   Temp:      TempSrc:      SpO2: (!) 79% (!) 84% 100%   Weight:    106 kg  Height:    6' (1.829 m)   Eyes: PERRL, lids and conjunctivae normal ENMT: Mucous membranes are dry. Posterior pharynx clear of any exudate or lesions.  Neck: In cervical collar Respiratory: Tachypneic decreased breath sounds noted on the right lung field with some  crackles appreciated.  Patient currently on high flow nasal cannula oxygen 8 L.  Voice is hoarse Cardiovascular: Tachycardia, no murmurs / rubs / gallops. No significant lower extremity edema. 2+ pedal pulses. No carotid bruits.  Abdomen: no tenderness, no masses palpated. No hepatosplenomegaly. Bowel sounds positive.  GU: Foley catheter in place with hematuria present Musculoskeletal: no clubbing / cyanosis.  Muscle wasting noted of lower extremities Skin: Sacral pressure wound.  Pressure woundon the right-sided neck from the cervical collar. Neurologic: CN 2-12 grossly intact. Sensation intact, DTR normal.  Quadruplet Psychiatric: Normal judgment and insight. Alert and oriented x 3. Normal mood.     Labs on Admission: I have personally reviewed following labs and imaging studies  CBC: Recent Labs  Lab 06/24/20 0306 06/25/20 0304 06/30/20 1006  WBC 8.1 9.2 9.7  NEUTROABS  --   --  7.4  HGB 8.6* 8.9* 9.9*  HCT 28.1* 29.1* 33.0*  MCV  97.6 97.0 95.9  PLT 173 174 258   Basic Metabolic Panel: Recent Labs  Lab 06/24/20 0306 06/25/20 0304 06/30/20 1006  NA 136 137 140  K 3.6 3.5 3.5  CL 104 103 104  CO2 27 28 28   GLUCOSE 118* 104* 101*  BUN 15 9 8   CREATININE 0.39* 0.35* 0.42*  CALCIUM 7.8* 8.0* 8.1*   GFR: Estimated Creatinine Clearance: 102 mL/min (A) (by C-G formula based on SCr of 0.42 mg/dL (L)). Liver Function Tests: Recent Labs  Lab 06/24/20 0306 06/25/20 0304 06/30/20 1006  AST 16 19 20   ALT 11 12 10   ALKPHOS 74 81 98  BILITOT 0.4 0.4 0.9  PROT 5.1* 5.1* 6.2*  ALBUMIN 1.6* 1.6* 1.7*   Recent Labs  Lab 06/30/20 1006  LIPASE 13   No results for input(s): AMMONIA in the last 168 hours. Coagulation Profile: Recent Labs  Lab 06/30/20 1005  INR 1.2   Cardiac Enzymes: No results for input(s): CKTOTAL, CKMB, CKMBINDEX, TROPONINI in the last 168 hours. BNP (last 3 results) No results for input(s): PROBNP in the last 8760 hours. HbA1C: No results for input(s): HGBA1C in the last 72 hours. CBG: No results for input(s): GLUCAP in the last 168 hours. Lipid Profile: No results for input(s): CHOL, HDL, LDLCALC, TRIG, CHOLHDL, LDLDIRECT in the last 72 hours. Thyroid Function Tests: No results for input(s): TSH, T4TOTAL, FREET4, T3FREE, THYROIDAB in the last 72 hours. Anemia Panel: No results for input(s): VITAMINB12, FOLATE, FERRITIN, TIBC, IRON, RETICCTPCT in the last 72 hours. Urine analysis:    Component Value Date/Time   COLORURINE BROWN (A) 06/30/2020 1043   APPEARANCEUR TURBID (A) 06/30/2020 1043   LABSPEC  06/30/2020 1043    TEST NOT REPORTED DUE TO COLOR INTERFERENCE OF URINE PIGMENT   PHURINE  06/30/2020 1043    TEST NOT REPORTED DUE TO COLOR INTERFERENCE OF URINE PIGMENT   GLUCOSEU (A) 06/30/2020 1043    TEST NOT REPORTED DUE TO COLOR INTERFERENCE OF URINE PIGMENT   HGBUR (A) 06/30/2020 1043    TEST NOT REPORTED DUE TO COLOR INTERFERENCE OF URINE PIGMENT   BILIRUBINUR (A)  06/30/2020 1043    TEST NOT REPORTED DUE TO COLOR INTERFERENCE OF URINE PIGMENT   KETONESUR (A) 06/30/2020 1043    TEST NOT REPORTED DUE TO COLOR INTERFERENCE OF URINE PIGMENT   PROTEINUR (A) 06/30/2020 1043    TEST NOT REPORTED DUE TO COLOR INTERFERENCE OF URINE PIGMENT   NITRITE (A) 06/30/2020 1043    TEST NOT REPORTED DUE TO COLOR INTERFERENCE OF URINE PIGMENT  LEUKOCYTESUR (A) 06/30/2020 1043    TEST NOT REPORTED DUE TO COLOR INTERFERENCE OF URINE PIGMENT   Sepsis Labs: Recent Results (from the past 240 hour(s))  Resp Panel by RT-PCR (Flu A&B, Covid) Nasopharyngeal Swab     Status: Abnormal   Collection Time: 06/26/20 11:30 AM   Specimen: Nasopharyngeal Swab; Nasopharyngeal(NP) swabs in vial transport medium  Result Value Ref Range Status   SARS Coronavirus 2 by RT PCR POSITIVE (A) NEGATIVE Final    Comment: RESULT CALLED TO, READ BACK BY AND VERIFIED WITH: FRANKLIN,D. RN @1315  ON 01.03.2022 BY COHEN,K (NOTE) SARS-CoV-2 target nucleic acids are DETECTED.  The SARS-CoV-2 RNA is generally detectable in upper respiratory specimens during the acute phase of infection. Positive results are indicative of the presence of the identified virus, but do not rule out bacterial infection or co-infection with other pathogens not detected by the test. Clinical correlation with patient history and other diagnostic information is necessary to determine patient infection status. The expected result is Negative.  Fact Sheet for Patients: EntrepreneurPulse.com.au  Fact Sheet for Healthcare Providers: IncredibleEmployment.be  This test is not yet approved or cleared by the [place] FDA and  has been authorized for detection and/or diagnosis of SARS-CoV-2 by FDA under an Emergency Use Authorization (EUA).  This EUA will remain in effect (meaning this  test can be used) for the duration of  the COVID-19 declaration under Section 564(b)(1) of the Act,  21 U.S.C. section 360bbb-3(b)(1), unless the authorization is terminated or revoked sooner.     Influenza A by PCR NEGATIVE NEGATIVE Final   Influenza B by PCR NEGATIVE NEGATIVE Final    Comment: (NOTE) The Xpert Xpress SARS-CoV-2/FLU/RSV plus assay is intended as an aid in the diagnosis of influenza from Nasopharyngeal swab specimens and should not be used as a sole basis for treatment. Nasal washings and aspirates are unacceptable for Xpert Xpress SARS-CoV-2/FLU/RSV testing.  Fact Sheet for Patients: EntrepreneurPulse.com.au  Fact Sheet for Healthcare Providers: IncredibleEmployment.be  This test is not yet approved or cleared by the [place] FDA and has been authorized for detection and/or diagnosis of SARS-CoV-2 by FDA under an Emergency Use Authorization (EUA). This EUA will remain in effect (meaning this test can be used) for the duration of the COVID-19 declaration under Section 564(b)(1) of the Act, 21 U.S.C. section 360bbb-3(b)(1), unless the authorization is terminated or revoked.  Performed at [facility], [place] [address] [place], [place] [address]      Radiological Exams on Admission: DG Chest Portable 1 View  Result Date: 06/30/2020 CLINICAL DATA:  COVID-19 positive patient with productive cough. EXAM: PORTABLE CHEST 1 VIEW COMPARISON:  Single-view of the chest 06/19/2020 and 06/06/2020. FINDINGS: Right pleural effusion and basilar airspace disease have markedly worsened. The patient has a very small left pleural effusion and mild basilar airspace disease. No pneumothorax. Heart size normal. IMPRESSION: Marked worsening of a right pleural effusion which appears moderate in size and right basilar airspace disease. Trace left pleural effusion is noted. Electronically Signed   By: Inge Rise M.D.   On: 06/30/2020 10:00    EKG: Independently reviewed.  Tachycardia 110 bpm  Assessment/Plan Complicated  urinary tract infection with chronic indwelling Foley and hematuria: Acute on chronic.  Patient presents with recurrence of hematuria.  Similar issues back in November December of this year resolving with bladder irrigation.  Urine cultures previously positive for achromobacter which was treated with IV meropenem.  Urinalysis was noted to be abnormal and concern  for possible continued infection for which meropenem was restarted. -Admit to a progressive bed -Follow-up blood and urine culture  -Continue empiric antibiotics of meropenem -Bladder irrigation -Urology Dr. Ronne Binning consulted, follow-up for any further recommendations  SIRS: Acute.  Patient noted to be tachycardic and tachypneic on admission with temperatures 100.2 F rectally.  Lactic acid was noted to be reassuring at 1.3.  Would suspect a urinary tract infection as a possible source of infection sacral wound appears to be healing. -Follow-up culture  Acute respiratory failure with hypoxia pleural effusion: Acute.  Patient's O2 saturations noted to drop down to 76% on room air.  Question of secondary to mucous plugging, pleural effusion, and/or pneumonia. -Continuous pulse oximetry with oxygen as needed to maintain O2 saturations -Initially ordered Lasix 40 mg IV, but patient noted to have increasing heart rates thereafter without significant improvement in respiratory status -PCCM was consulted for possible need of thoracentesis, but noted not enough fluid to be drained -Respiratory therapy consult for pulmonary toilet  Suspected aspiration pneumonia: Patient noted to have a wet productive cough concern for aspiration.  Evaluated by speech therapy and recommended regular diet with small bites.  Chest x-ray moderate right-sided pleural effusion with airspace disease and left-sided trace pleural effusion.  CT of the abdomen from 12/27 was significant for a patchy bilateral infiltrates with pleural effusions worse on the right.  PCCM was  consulted and evaluated the patient but felt that the effusion was not able to be tapped.   -Aspiration precautions -Check procalcitonin -N.p.o. for now -Speech therapy consult to evaluate and treat  Severe protein calorie malnutrition: Patient with albumin 1.7.  Suspect initial -Check prealbumin in a.m. -Dietitian consult for in a.m.  COVID-19 infection: Patient had been incidentally found to be COVID-19 positive on 12/14.  Appears he should be out of the 21-day window. -Discontinue contact precautions  Sacral wound: Patient had received hydrotherapy on 06/22/2020.  Sacral wound with granulation tissue present. -Low-air-loss bed replaced -Wound care consult  Cervical myelopathy with quadriparesis -Continue cervical collar  Normocytic normochromic anemia: Hemoglobin 9.9 on admission which appears slightly improved from previous despite hematuria. -Continue to monitor   DVT prophylaxis: SCD Code Status: Full Family Communication: Niece updated at bedside Disposition Plan: Likely discharge back to [facility] once medically Consults called: urology Admission status: Inpatient, require more than 2 midnight stay  Clydie Braun MD [facility]   If 7PM-7AM, please contact night-coverage   06/30/2020, 11:52 AM

## 2020-06-30 NOTE — ED Triage Notes (Signed)
Pt arrvies from [facility] with complaints of blood in foley cath. Pt also has wet cough which has been ongoing. Edema noted in all 4 extremities. Pt alert and oriented X4. Pt seen at [place] on 12/27 for same complaint.

## 2020-06-30 NOTE — Progress Notes (Signed)
Brief Note: PCCM called for pleural effusion consult Bedside US does not indicate pleural effusion but shows dense infiltrate consistent with pneumonia.  Encourage chest PT and pulmonary toileting. Agree with antibiotics for pneumonia coverage. Findings discussed with hospitalist Dr. Katrinka Blazing.  Melody Comas, MD [facility] Office: [phone]   See Amion for Pager Details

## 2020-06-30 NOTE — Progress Notes (Signed)
Pharmacy Antibiotic Note  Shannon [name] is a 75 y.o. male admitted on 06/30/2020 with sepsis.  Of note patient grew achromobacter xylosoxidans UTI during his last admission was treated with meropenem and vancomycin. Pharmacy has been consulted for meropenem dosing.  SCr 0.42, WBC wnl, LA 1.3   Plan: -Start meropenem 2 gm IV Q 8 hours -Monitor CBC, renal fx, cultures and clinical progress     Temp (24hrs), Avg:98.4 F (36.9 C), Min:98.4 F (36.9 C), Max:98.4 F (36.9 C)  Recent Labs  Lab 06/24/20 0306 06/25/20 0304  WBC 8.1 9.2  CREATININE 0.39* 0.35*    Estimated Creatinine Clearance: 102 mL/min (A) (by C-G formula based on SCr of 0.35 mg/dL (L)).    Allergies  Allergen Reactions  . Iodine Swelling  . Penicillins Swelling    ** tolerates cephalosporins Facial swelling, itchy throat    Antimicrobials this admission: Meropenem 1/7 >>   Dose adjustments this admission:  Microbiology results: 1/7 BCx:  1/7 UCx:     Thank you for allowing pharmacy to be a part of this patient's care.  Vinnie Level, PharmD., BCPS, BCCCP Clinical Pharmacist Please refer to [facility] for unit-specific pharmacist

## 2020-06-30 NOTE — ED Notes (Signed)
Bladder Irrigation Bags x2 ordered-per Pattricia Boss, RN ordered by Marylene Land

## 2020-06-30 NOTE — Consult Note (Signed)
Urology Consult  Referring physician: Dr. Katrinka Blazing Reason for referral: Gross hematuria  Chief Complaint: gross hematruia  History of Present Illness: Shannon [name] is a 74yo who presented to the ER with confusion and new onset gross hematuria. The patient has a history cervical disk fusion in Oct 2021 and then a fall after surgery. He had a spinal cord injury and since his injury he has been unable to urinate. He has had an indwelling foley for 2.5 months. He has been admitted recently for sepsis from a urinary source and gross hematuria requiring CBI. He was doing well until yesterday when he developed confusion, fatigue and gross hematuria. He presented to the ER and sepsis protocol was initiated. Urine is currently cloudy and dark brown. No active gross hematuria.  Past Medical History:  Diagnosis Date  . Asthma   . 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 Newell Coral, 1992-->surg at [facility]  Summer 2021->Cervicalgia,arm pain, hand atrophy RUE wkness, hyperreflex (Dr. 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 Hay fever   . Hypertension   . Osteoarthritis of both hands    Past Surgical History:  Procedure Laterality Date  . ANTERIOR CERVICAL DECOMP/DISCECTOMY FUSION N/A 04/05/2020   Procedure: ANTERIOR CERVICAL DECOMPRESSION/DISCECTOMY FUSION, INTERBODY PROSTHESIS, PLATE/SCREWS CERVICAL THREE-CERVICAL FOUR, CERVICAL FOUR- CERVICAL FIVE;  Surgeon: Tressie Stalker, MD;  Location: [facility] OR;  Service: Neurosurgery;  Laterality: N/A;  . ANTERIOR CERVICAL DECOMP/DISCECTOMY FUSION N/A 04/22/2020   Procedure: Reexploration of anterior cervical wound for epidural hematoma;  Surgeon: Donalee Citrin, MD;  Location: [facility] OR;  Service: Neurosurgery;  Laterality: N/A;  . BACK SURGERY    . INCISION AND DRAINAGE Left 2012   L hand infection  . ROTATOR CUFF REPAIR Right    x 3  .  SPINE SURGERY      Medications: I have reviewed the patient's current medications. Allergies:  Allergies  Allergen Reactions  . Iodine Swelling  . Penicillins Swelling    ** tolerates cephalosporins Facial swelling, itchy throat    Family History  Problem Relation Age of Onset  . Heart attack Mother   . Heart disease Mother   . High blood pressure Mother   . Alcohol abuse Father   . Cancer Father   . Diabetes Father   . High blood pressure Father    Social History:  reports that he has quit smoking. He has never used smokeless tobacco. He reports previous alcohol use. He reports that he does not use drugs.  Review of Systems  Genitourinary: Positive for difficulty urinating and hematuria.  All other systems reviewed and are negative.   Physical Exam:  Vital signs in last 24 hours: Temp:  [98 F (36.7 C)-100.3 F (37.9 C)] 100.3 F (37.9 C) (01/07 1408) Pulse Rate:  [111-134] 113 (01/07 1958) Resp:  [16-29] 16 (01/07 1958) BP: (101-140)/(71-91) 110/76 (01/07 1958) SpO2:  [79 %-100 %] 97 % (01/07 1958) Weight:  [540 kg] 106 kg (01/07 1143) Physical Exam Vitals and nursing note reviewed.  Constitutional:      Appearance: Normal appearance.  HENT:     Head: Normocephalic and atraumatic.     Nose: Nose normal.     Mouth/Throat:     Mouth: Mucous membranes are moist.  Eyes:     Extraocular Movements: Extraocular movements intact.     Pupils: Pupils are equal, round, and reactive to light.  Neck:     Comments: Cervical brace in place Cardiovascular:     Rate and Rhythm: Normal rate and regular rhythm.  Pulmonary:     Effort: Pulmonary effort is normal. No respiratory distress.  Abdominal:     General: Abdomen is flat. There is no distension.  Musculoskeletal:        General: No swelling or deformity.  Skin:    General: Skin is warm and dry.  Neurological:     General: No focal deficit present.     Mental Status: He is alert and oriented to person, place,  and time. Mental status is at baseline.  Psychiatric:        Mood and Affect: Mood normal.        Behavior: Behavior normal.        Thought Content: Thought content normal.        Judgment: Judgment normal.     Laboratory Data:  Results for orders placed or performed during the hospital encounter of 06/30/20 (from the past 72 hour(s))  Brain natriuretic peptide     Status: None   Collection Time: 06/30/20 10:00 AM  Result Value Ref Range   B Natriuretic Peptide 35.9 0.0 - 100.0 pg/mL    Comment: Performed at [facility], [address] [place], [place] [address]  Protime-INR     Status: None   Collection Time: 06/30/20 10:05 AM  Result Value Ref Range   Prothrombin Time 14.5 11.4 - 15.2 seconds   INR 1.2 0.8 - 1.2    Comment: (NOTE) INR goal varies based on device and disease states. Performed at [facility], [address] [place], [place] [address]   APTT     Status: None   Collection Time: 06/30/20 10:05 AM  Result Value Ref Range   aPTT 35 24 - 36 seconds    Comment: Performed at [facility], [address] [place], [place] [address]  CBC with Differential     Status: Abnormal   Collection Time: 06/30/20 10:06 AM  Result Value Ref Range   WBC 9.7 4.0 - 10.5 K/uL   RBC 3.44 (L) 4.22 - 5.81 MIL/uL   Hemoglobin 9.9 (L) 13.0 - 17.0 g/dL   HCT 91.433.0 (L) 78.239.0 - 95.652.0 %   MCV 95.9 80.0 - 100.0 fL   MCH 28.8 26.0 - 34.0 pg   MCHC 30.0 30.0 - 36.0 g/dL   RDW 21.314.6 08.611.5 - 57.815.5 %   Platelets 258 150 - 400 K/uL   nRBC 0.0 0.0 - 0.2 %   Neutrophils Relative % 75 %   Neutro Abs 7.4 1.7 - 7.7 K/uL   Lymphocytes Relative 12 %   Lymphs Abs 1.1 0.7 - 4.0 K/uL   Monocytes Relative 7 %   Monocytes Absolute 0.7 0.1 - 1.0 K/uL   Eosinophils Relative 4 %   Eosinophils Absolute 0.4 0.0 - 0.5 K/uL   Basophils Relative 1 %   Basophils Absolute 0.1 0.0 - 0.1 K/uL   Immature Granulocytes 1 %   Abs Immature Granulocytes 0.09 (H) 0.00 - 0.07 K/uL    Comment:  Performed at [facility], [address] [place], [place] [address]  Comprehensive metabolic panel     Status: Abnormal   Collection Time: 06/30/20 10:06 AM  Result Value Ref Range   Sodium 140 135 - 145 mmol/L   Potassium 3.5 3.5 - 5.1 mmol/L   Chloride 104 98 - 111 mmol/L  CO2 28 22 - 32 mmol/L   Glucose, Bld 101 (H) 70 - 99 mg/dL    Comment: Glucose reference range applies only to samples taken after fasting for at least 8 hours.   BUN 8 8 - 23 mg/dL   Creatinine, Ser 8.18 (L) 0.61 - 1.24 mg/dL   Calcium 8.1 (L) 8.9 - 10.3 mg/dL   Total Protein 6.2 (L) 6.5 - 8.1 g/dL   Albumin 1.7 (L) 3.5 - 5.0 g/dL   AST 20 15 - 41 U/L   ALT 10 0 - 44 U/L   Alkaline Phosphatase 98 38 - 126 U/L   Total Bilirubin 0.9 0.3 - 1.2 mg/dL   GFR, Estimated >56 >31 mL/min    Comment: (NOTE) Calculated using the CKD-EPI Creatinine Equation (2021)    Anion gap 8 5 - 15    Comment: Performed at [facility], [address] [place], [place] [address]  Lipase, blood     Status: None   Collection Time: 06/30/20 10:06 AM  Result Value Ref Range   Lipase 13 11 - 51 U/L    Comment: Performed at [facility], [address] [place], [place] [address]  Lactic acid, plasma     Status: None   Collection Time: 06/30/20 10:06 AM  Result Value Ref Range   Lactic Acid, Venous 1.3 0.5 - 1.9 mmol/L    Comment: Performed at [facility], [address], [place], [place] [address]  Procalcitonin - Baseline     Status: None   Collection Time: 06/30/20 10:06 AM  Result Value Ref Range   Procalcitonin <0.10 ng/mL    Comment:        Interpretation: PCT (Procalcitonin) <= 0.5 ng/mL: Systemic infection (sepsis) is not likely. Local bacterial infection is possible. (NOTE)       Sepsis PCT Algorithm           Lower Respiratory Tract                                      Infection PCT Algorithm    ----------------------------     ----------------------------         PCT < 0.25 ng/mL                 PCT < 0.10 ng/mL          Strongly encourage             Strongly discourage   discontinuation of antibiotics    initiation of antibiotics    ----------------------------     -----------------------------       PCT 0.25 - 0.50 ng/mL            PCT 0.10 - 0.25 ng/mL               OR       >80% decrease in PCT            Discourage initiation of                                            antibiotics      Encourage discontinuation           of antibiotics    ----------------------------     -----------------------------  PCT >= 0.50 ng/mL              PCT 0.26 - 0.50 ng/mL               AND        <80% decrease in PCT             Encourage initiation of                                             antibiotics       Encourage continuation           of antibiotics    ----------------------------     -----------------------------        PCT >= 0.50 ng/mL                  PCT > 0.50 ng/mL               AND         increase in PCT                  Strongly encourage                                      initiation of antibiotics    Strongly encourage escalation           of antibiotics                                     -----------------------------                                           PCT <= 0.25 ng/mL                                                 OR                                        > 80% decrease in PCT                                      Discontinue / Do not initiate                                             antibiotics  Performed at [name], [address], [place], [place] [address]   Urinalysis, Routine w reflex microscopic Urine, Clean Catch     Status: Abnormal   Collection Time: 06/30/20 10:43 AM  Result Value Ref Range   Color, Urine BROWN (A) YELLOW    Comment: BIOCHEMICALS MAY BE AFFECTED BY COLOR   APPearance TURBID (A) CLEAR  Specific Gravity, Urine  1.005 - 1.030    TEST NOT REPORTED DUE TO COLOR INTERFERENCE OF URINE PIGMENT    pH  5.0 - 8.0    TEST NOT REPORTED DUE TO COLOR INTERFERENCE OF URINE PIGMENT   Glucose, UA (A) NEGATIVE mg/dL    TEST NOT REPORTED DUE TO COLOR INTERFERENCE OF URINE PIGMENT   Hgb urine dipstick (A) NEGATIVE    TEST NOT REPORTED DUE TO COLOR INTERFERENCE OF URINE PIGMENT   Bilirubin Urine (A) NEGATIVE    TEST NOT REPORTED DUE TO COLOR INTERFERENCE OF URINE PIGMENT   Ketones, ur (A) NEGATIVE mg/dL    TEST NOT REPORTED DUE TO COLOR INTERFERENCE OF URINE PIGMENT   Protein, ur (A) NEGATIVE mg/dL    TEST NOT REPORTED DUE TO COLOR INTERFERENCE OF URINE PIGMENT   Nitrite (A) NEGATIVE    TEST NOT REPORTED DUE TO COLOR INTERFERENCE OF URINE PIGMENT   Leukocytes,Ua (A) NEGATIVE    TEST NOT REPORTED DUE TO COLOR INTERFERENCE OF URINE PIGMENT    Comment: Performed at [facility], [address] [place], [place] [address]  Urinalysis, Microscopic (reflex)     Status: Abnormal   Collection Time: 06/30/20 10:43 AM  Result Value Ref Range   RBC / HPF >50 0 - 5 RBC/hpf   WBC, UA 11-20 0 - 5 WBC/hpf   Bacteria, UA MANY (A) NONE SEEN   Squamous Epithelial / LPF NONE SEEN 0 - 5   Mucus PRESENT    Amorphous Crystal PRESENT     Comment: Performed at [facility], [address] [place], [place] [address]   Recent Results (from the past 240 hour(s))  Resp Panel by RT-PCR (Flu A&B, Covid) Nasopharyngeal Swab     Status: Abnormal   Collection Time: 06/26/20 11:30 AM   Specimen: Nasopharyngeal Swab; Nasopharyngeal(NP) swabs in vial transport medium  Result Value Ref Range Status   SARS Coronavirus 2 by RT PCR POSITIVE (A) NEGATIVE Final    Comment: RESULT CALLED TO, READ BACK BY AND VERIFIED WITH: Sharrell Ku. RN @1315  ON 01.03.2022 BY COHEN,K (NOTE) SARS-CoV-2 target nucleic acids are DETECTED.  The SARS-CoV-2 RNA is generally detectable in upper respiratory specimens during the acute phase of infection. Positive results are indicative of the presence of the identified virus, but do  not rule out bacterial infection or co-infection with other pathogens not detected by the test. Clinical correlation with patient history and other diagnostic information is necessary to determine patient infection status. The expected result is Negative.  Fact Sheet for Patients: 03.03.2022  Fact Sheet for Healthcare Providers: BloggerCourse.com  This test is not yet approved or cleared by the [place]ious[place] FDA and  has been authorized for detection and/or diagnosis of SARS-CoV-2 by FDA under an Emergency Use Authorization (EUA).  This EUA will remain in effect (meaning this  test can be used) for the duration of  the COVID-19 declaration under Section 564(b)(1) of the Act, 21 U.S.C. section 360bbb-3(b)(1), unless the authorization is terminated or revoked sooner.     Influenza A by PCR NEGATIVE NEGATIVE Final   Influenza B by PCR NEGATIVE NEGATIVE Final    Comment: (NOTE) The Xpert Xpress SARS-CoV-2/FLU/RSV plus assay is intended as an aid in the diagnosis of influenza from Nasopharyngeal swab specimens and should not be used as a sole basis for treatment. Nasal washings and aspirates are unacceptable for Xpert Xpress SARS-CoV-2/FLU/RSV testing.  Fact Sheet for Patients: [place]  Fact Sheet for Healthcare  Providers: SeriousBroker.it  This test is not yet approved or cleared by the [place] and has been authorized for detection and/or diagnosis of SARS-CoV-2 by FDA under an Emergency Use Authorization (EUA). This EUA will remain in effect (meaning this test can be used) for the duration of the COVID-19 declaration under Section 564(b)(1) of the Act, 21 U.S.C. section 360bbb-3(b)(1), unless the authorization is terminated or revoked.  Performed at [facility], [address], [place], [place] [address]    Creatinine: Recent  Labs    06/24/20 0306 06/25/20 0304 06/30/20 1006  CREATININE 0.39* 0.35* 0.42*   Baseline Creatinine: 0.4  Impression/Assessment:  74yo with urinary retention, gross hematuria, sepsis from a urinary source  Plan:  1. Urinary retention: Please continue indwelling foley catheter.  2. Gross hematuria: His urine is clearing and he does not require CBI at this time. His hematuria is likely related to acute cystitis 3. Sepsis from aurinary source: Please continue broad spectrum antibiotics pending his urine culture. Patient will need 2 weeks of culture specific antibiotics.  Wilkie Aye 06/30/2020, 8:34 PM

## 2020-06-30 NOTE — Progress Notes (Signed)
Pt refused CPT at this time.

## 2020-07-01 ENCOUNTER — Inpatient Hospital Stay (HOSPITAL_COMMUNITY): Payer: Medicare Other

## 2020-07-01 DIAGNOSIS — U071 COVID-19: Secondary | ICD-10-CM | POA: Diagnosis not present

## 2020-07-01 DIAGNOSIS — J690 Pneumonitis due to inhalation of food and vomit: Secondary | ICD-10-CM | POA: Diagnosis not present

## 2020-07-01 DIAGNOSIS — R319 Hematuria, unspecified: Secondary | ICD-10-CM | POA: Diagnosis not present

## 2020-07-01 DIAGNOSIS — E43 Unspecified severe protein-calorie malnutrition: Secondary | ICD-10-CM | POA: Diagnosis not present

## 2020-07-01 LAB — BASIC METABOLIC PANEL
Anion gap: 14 (ref 5–15)
BUN: 9 mg/dL (ref 8–23)
CO2: 27 mmol/L (ref 22–32)
Calcium: 7.9 mg/dL — ABNORMAL LOW (ref 8.9–10.3)
Chloride: 100 mmol/L (ref 98–111)
Creatinine, Ser: 0.52 mg/dL — ABNORMAL LOW (ref 0.61–1.24)
GFR, Estimated: >60 mL/min (ref >60)
Glucose, Bld: 106 mg/dL — ABNORMAL HIGH (ref 70–99)
Potassium: 3.3 mmol/L — ABNORMAL LOW (ref 3.5–5.1)
Sodium: 141 mmol/L (ref 135–145)

## 2020-07-01 LAB — CBC
HCT: 32.3 % — ABNORMAL LOW (ref 39.0–52.0)
Hemoglobin: 9.8 g/dL — ABNORMAL LOW (ref 13.0–17.0)
MCH: 28.7 pg (ref 26.0–34.0)
MCHC: 30.3 g/dL (ref 30.0–36.0)
MCV: 94.7 fL (ref 80.0–100.0)
Platelets: 235 10*3/uL (ref 150–400)
RBC: 3.41 MIL/uL — ABNORMAL LOW (ref 4.22–5.81)
RDW: 14.3 % (ref 11.5–15.5)
WBC: 13.1 10*3/uL — ABNORMAL HIGH (ref 4.0–10.5)
nRBC: 0 % (ref 0.0–0.2)

## 2020-07-01 IMAGING — DX DG CHEST 1V PORT SAME DAY
1 series · 1 of 1 positions shown · non-contrast
Comparison: Chest x-ray dated [DATE].

CLINICAL DATA: Shortness of breath.  COVID positive.

EXAM:
PORTABLE CHEST 1 VIEW

[chest]
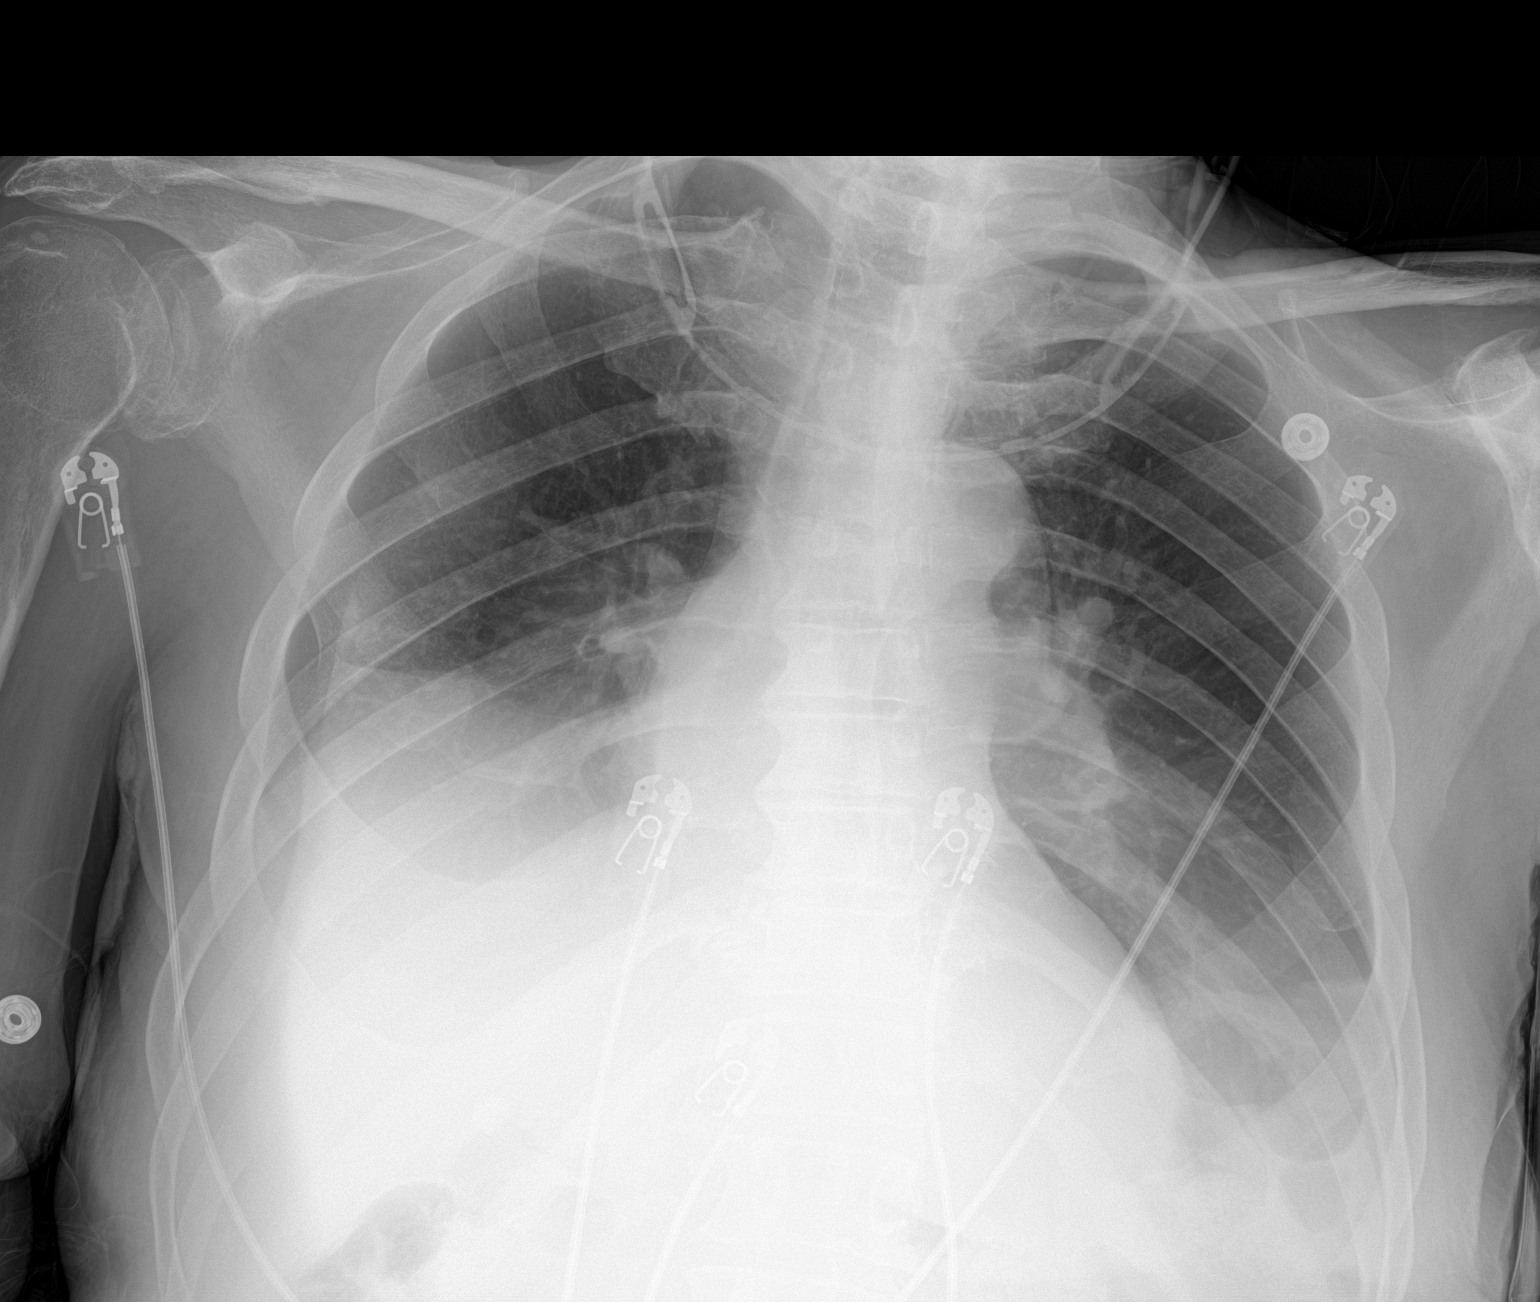

[1 of 1 positions shown; findings below may reference images not displayed]

FINDINGS: Heart size and mediastinal contours are stable. Small pleural
effusion and/or atelectasis at the LEFT lung base. Stable opacity at
the RIGHT lung base, moderate-sized pleural effusion and/or
atelectasis or pneumonia. No pneumothorax is seen.
IMPRESSION: 1. Dense opacity at the RIGHT lung base, stable compared to most
recent chest x-ray of [DATE], compatible with moderate-sized
pleural effusion with underlying atelectasis or pneumonia.
2. Small pleural effusion and/or atelectasis at the LEFT lung base.

## 2020-07-01 MED ORDER — POTASSIUM CHLORIDE 10 MEQ/100ML IV SOLN
10.0000 meq | INTRAVENOUS | Status: AC
  Administered 2020-07-01 (×2): 10 meq via INTRAVENOUS
  Filled 2020-07-01 (×2): qty 100

## 2020-07-01 MED ORDER — GABAPENTIN 100 MG PO CAPS
200.0000 mg | ORAL_CAPSULE | Freq: Two times a day (BID) | ORAL | Status: DC
  Administered 2020-07-02 – 2020-07-04 (×5): 200 mg via ORAL
  Filled 2020-07-01 (×5): qty 2

## 2020-07-01 MED ORDER — PANTOPRAZOLE SODIUM 40 MG PO TBEC
40.0000 mg | DELAYED_RELEASE_TABLET | Freq: Every day | ORAL | Status: DC
  Administered 2020-07-02 – 2020-07-04 (×3): 40 mg via ORAL
  Filled 2020-07-01 (×4): qty 1

## 2020-07-01 MED ORDER — TAMSULOSIN HCL 0.4 MG PO CAPS
0.4000 mg | ORAL_CAPSULE | Freq: Every day | ORAL | Status: DC
  Administered 2020-07-02 – 2020-07-09 (×4): 0.4 mg via ORAL
  Filled 2020-07-01 (×6): qty 1

## 2020-07-01 MED ORDER — ACETAMINOPHEN 500 MG PO TABS
1000.0000 mg | ORAL_TABLET | Freq: Three times a day (TID) | ORAL | Status: DC
  Administered 2020-07-02 – 2020-07-04 (×7): 1000 mg via ORAL
  Filled 2020-07-01 (×8): qty 2

## 2020-07-01 MED ORDER — ASCORBIC ACID 500 MG PO TABS
1000.0000 mg | ORAL_TABLET | Freq: Every day | ORAL | Status: DC
  Administered 2020-07-02 – 2020-07-04 (×2): 1000 mg via ORAL
  Filled 2020-07-01 (×2): qty 2

## 2020-07-01 MED ORDER — OXYCODONE HCL 5 MG PO TABS
2.5000 mg | ORAL_TABLET | Freq: Four times a day (QID) | ORAL | Status: DC | PRN
  Filled 2020-07-01: qty 1

## 2020-07-01 MED ORDER — ESCITALOPRAM OXALATE 5 MG PO TABS
2.5000 mg | ORAL_TABLET | Freq: Every day | ORAL | Status: DC
  Administered 2020-07-02 – 2020-07-03 (×2): 2.5 mg via ORAL
  Filled 2020-07-01 (×5): qty 1

## 2020-07-01 MED ORDER — POLYETHYLENE GLYCOL 3350 17 G PO PACK
17.0000 g | PACK | Freq: Every morning | ORAL | Status: DC
  Administered 2020-07-04: 17 g via ORAL
  Filled 2020-07-01 (×2): qty 1

## 2020-07-01 MED ORDER — CHLORHEXIDINE GLUCONATE CLOTH 2 % EX PADS
6.0000 | MEDICATED_PAD | Freq: Every day | CUTANEOUS | Status: DC
  Administered 2020-07-01 – 2020-07-11 (×11): 6 via TOPICAL

## 2020-07-01 MED ORDER — VANCOMYCIN HCL 1500 MG/300ML IV SOLN
1500.0000 mg | Freq: Two times a day (BID) | INTRAVENOUS | Status: DC
  Administered 2020-07-01 – 2020-07-03 (×5): 1500 mg via INTRAVENOUS
  Filled 2020-07-01 (×7): qty 300

## 2020-07-01 MED ORDER — SCOPOLAMINE 1 MG/3DAYS TD PT72
1.0000 | MEDICATED_PATCH | TRANSDERMAL | Status: DC
  Administered 2020-07-04 – 2020-07-10 (×3): 1.5 mg via TRANSDERMAL
  Filled 2020-07-01 (×4): qty 1

## 2020-07-01 MED ORDER — FERROUS SULFATE 325 (65 FE) MG PO TABS
325.0000 mg | ORAL_TABLET | Freq: Two times a day (BID) | ORAL | Status: DC
Start: 2020-07-01 — End: 2020-07-05
  Administered 2020-07-02 – 2020-07-04 (×4): 325 mg via ORAL
  Filled 2020-07-01 (×4): qty 1

## 2020-07-01 MED ORDER — MIDODRINE HCL 5 MG PO TABS
10.0000 mg | ORAL_TABLET | Freq: Two times a day (BID) | ORAL | Status: DC
  Administered 2020-07-02 – 2020-07-04 (×4): 10 mg via ORAL
  Filled 2020-07-01 (×4): qty 2

## 2020-07-01 MED ORDER — ENSURE ENLIVE PO LIQD
237.0000 mL | Freq: Four times a day (QID) | ORAL | Status: DC
  Filled 2020-07-01: qty 237

## 2020-07-01 NOTE — Progress Notes (Signed)
Pharmacy Antibiotic Note  Shannon [name] is a 75 y.o. male admitted on 06/30/2020 with pneumonia.  Pharmacy has been consulted for Vancomycin dosing.  CrCl >100 ml/min  Vancomycin 1500 mg IV Q 12 hrs. Goal AUC 400-550. Expected AUC: 545 SCr used: 1   Plan: Vanco 1500 mg IV q12hr Monitor renal function, clinical status, cultures and vanc levels as needed.  Height: 6' (182.9 cm) Weight: 106 kg (233 lb 11 oz) IBW/kg (Calculated) : 77.6  Temp (24hrs), Avg:98.6 F (37 C), Min:98 F (36.7 C), Max:100.3 F (37.9 C)  Recent Labs  Lab 06/25/20 0304 06/30/20 1006 07/01/20 0617  WBC 9.2 9.7 13.1*  CREATININE 0.35* 0.42* 0.52*  LATICACIDVEN  --  1.3  --     Estimated Creatinine Clearance: 102 mL/min (A) (by C-G formula based on SCr of 0.52 mg/dL (L)).    Allergies  Allergen Reactions  . Iodine Swelling  . Penicillins Swelling    ** tolerates cephalosporins Facial swelling, itchy throat    Antimicrobials this admission: Vanc 1/8 >>   Thank you for allowing pharmacy to be a part of this patient's care.  Jeanella Cara, PharmD, [facility] Clinical Pharmacist Please see AMION for all Pharmacists' Contact Phone Numbers 07/01/2020, 8:46 AM

## 2020-07-01 NOTE — Evaluation (Signed)
Clinical/Bedside Swallow Evaluation Patient Details  Name: Shannon [name] MRN: [id number] Date of Birth: [birth year]  Today's Date: 07/01/2020 Time: SLP Start Time (ACUTE ONLY): 1500 SLP Stop Time (ACUTE ONLY): 1520 SLP Time Calculation (min) (ACUTE ONLY): 20 min  Past Medical History:  Past Medical History:  Diagnosis Date  . Asthma   . 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 Newell Coral, 1992-->surg at [facility].  Summer 2021->Cervicalgia,arm pain, hand atrophy RUE wkness, hyperreflex (Dr. 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 Hay fever   . Hypertension   . Osteoarthritis of both hands    Past Surgical History:  Past Surgical History:  Procedure Laterality Date  . ANTERIOR CERVICAL DECOMP/DISCECTOMY FUSION N/A 04/05/2020   Procedure: ANTERIOR CERVICAL DECOMPRESSION/DISCECTOMY FUSION, INTERBODY PROSTHESIS, PLATE/SCREWS CERVICAL THREE-CERVICAL FOUR, CERVICAL FOUR- CERVICAL FIVE;  Surgeon: Tressie Stalker, MD;  Location: [facility] OR;  Service: Neurosurgery;  Laterality: N/A;  . ANTERIOR CERVICAL DECOMP/DISCECTOMY FUSION N/A 04/22/2020   Procedure: Reexploration of anterior cervical wound for epidural hematoma;  Surgeon: Donalee Citrin, MD;  Location: [facility] OR;  Service: Neurosurgery;  Laterality: N/A;  . BACK SURGERY    . INCISION AND DRAINAGE Left 2012   L hand infection  . ROTATOR CUFF REPAIR Right    x 3  . SPINE SURGERY     HPI:  Shannon [name] is a 75 y.o. 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  He originally had a C3-4 [place] 4-5 anterior cervical disc ACDF with decompression, C3 3-4 and C4-5 interbody arthrodesis with local autograft bone, anterior cervical plating of C3-C5 on 04/05/2020.  He then went home and had a fall from his bed, he was then  hospitalized and from here he was sent to a nursing home, he has been quadriplegic since then, he has also developed sacral decubitus ulcers. Was seen for MBS on 05/04/20 "deficits resulted in silent aspiration of thin liquid and nectar thick liquid by straw prior to the swallow. With nectar thick liquid by cup, only transient penetration was seen. There was significant pharyngeal residue with puree and solid consistencies which was reduced but not fully cleared with liquid wash." Pt was then admitted for AMS due to UTI, sepsis and hypotension and underwent mbs with normal swallow. Pt now admitted with hematuria - Also diagnosed with COVID 06/11/2020.  MD ordered swallow eval currently due to"suspicion for aspiration pna".  Pt has remained on precautions due to his continued cough. Pt reports he only coughs when he is being moved around.   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 Of note MBSS 06/05/2020 was within functional limits without evidence of laryngeal penetration or tracheal aspiration. Pt has since had repeat hospitalizations and appearing more decompensated. Pt with isolated coughing following ice chips and small sip of water concerning for reduced airway protection. Cough noted to be weak and congested. Laryngeal elevation per palpation also appeared reduced. Will repeat objective swallow study to guide diet recommendations. Continue NPO until completion of MBSS.  SLP Visit Diagnosis: Dysphagia, unspecified (R13.10)    Aspiration Risk  Moderate aspiration risk;Risk for inadequate nutrition/hydration    Diet Recommendation   NPO  Medication Administration: Via alternative means    Other  Recommendations Oral Care Recommendations: Oral care QID  Follow up Recommendations Skilled Nursing facility      Frequency and Duration min 2x/week  2 weeks       Prognosis Prognosis for Safe Diet Advancement:  Fair Barriers to Reach Goals: Severity of deficits      Swallow Study   General Date of Onset: 06/30/20 HPI: Shannon [name] is a 75 y.o. 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  He originally had a C3-4 [place] 4-5 anterior cervical disc ACDF with decompression, C3 3-4 and C4-5 interbody arthrodesis with local autograft bone, anterior cervical plating of C3-C5 on 04/05/2020.  He then went home and had a fall from his bed, he was then hospitalized and from here he was sent to a nursing home, he has been quadriplegic since then, he has also developed sacral decubitus ulcers. Was seen for MBS on 05/04/20 "deficits resulted in silent aspiration of thin liquid and nectar thick liquid by straw prior to the swallow. With nectar thick liquid by cup, only transient penetration was seen. There was significant pharyngeal residue with puree and solid consistencies which was reduced but not fully cleared with liquid wash." Pt was then admitted for AMS due to UTI, sepsis and hypotension and underwent mbs with normal swallow. Pt now admitted with hematuria - Also diagnosed with COVID 06/11/2020.  MD ordered swallow eval currently due to"suspicion for aspiration pna".  Pt has remained on precautions due to his continued cough. Pt reports he only coughs when he is being moved around. Type of Study: Bedside Swallow Evaluation Previous Swallow Assessment: MBSS 06/05/20; WFL Diet Prior to this Study: NPO Temperature Spikes Noted: No Respiratory Status: Nasal cannula History of Recent Intubation: No Behavior/Cognition: Alert;Requires cueing;Cooperative Oral Cavity Assessment: Within Functional Limits Oral Care Completed by SLP: No Oral Cavity - Dentition: Missing dentition Vision: Impaired for self-feeding Self-Feeding Abilities: Total assist Patient Positioning: Upright in bed Baseline Vocal Quality: Low vocal  intensity Volitional Cough: Congested;Weak Volitional Swallow: Able to elicit    Oral/Motor/Sensory Function Overall Oral Motor/Sensory Function: Generalized oral weakness   Ice Chips Ice chips: Impaired Presentation: Spoon Oral Phase Impairments: Reduced lingual movement/coordination Oral Phase Functional Implications: Prolonged oral transit Pharyngeal Phase Impairments: Suspected delayed Swallow;Cough - Immediate;Decreased hyoid-laryngeal movement;Multiple swallows;Wet Vocal Quality   Thin Liquid Thin Liquid: Impaired Presentation: Cup Pharyngeal  Phase Impairments: Suspected delayed Swallow;Decreased hyoid-laryngeal movement;Multiple swallows;Cough - Immediate;Cough - Delayed;Wet Vocal Quality    Nectar Thick Nectar Thick Liquid: Not tested   Honey Thick Honey Thick Liquid: Not tested   Puree Puree: Not tested   Solid     Solid: Not tested      Shannon [name] E Tracy Gerken MA, CCC-SLP Acute Rehabilitation Services 07/01/2020,3:46 PM

## 2020-07-01 NOTE — Progress Notes (Addendum)
PROGRESS NOTE                                                                                                                                                                                                             Patient Demographics:    Shannon [name], is a 75 y.o. male, DOB - [birth year], ZOX:[id number]  Outpatient Primary MD for the patient is McGowen, Maryjean MornPhilip H, MD   Admit date - 06/30/2020   LOS - 1  Chief Complaint  Patient presents with  . Hematuria       Brief Narrative: 75 y.o.malewith medical history significant ofquadriplegia, Chiari I malformation with syringomyelia, ACDF in October 2021, residual quadriplegia, then had a fall at home developed epidural hematoma, underwent reexploration, removal of hardware, evacuation of hematoma on 10/30, now quadriplegic with dysphagia, unstageable sacral decubitus wounds, failure to thrive-with numerous recent hospitalizations for aspiration pneumonia/hematuria/UTI-brought to the ED from SNF for evaluation of recurrent hematuria, cough-found to have acute hypoxic respiratory failure due to aspiration pneumonia, probable complicated UTI-and subsequently admitted to the hospitalist service.    Note-patient was found to be COVID-19 positive during his prior hospitalization on 12/14-received monoclonal antibody-he continues to have a positive PCR-but does not have active COVID-19 infection.   COVID-19 vaccinated status: Vaccinated  Significant studies: 1/7>> chest x-ray: Right lower lobe infiltrate (personally reviewed) 1/8>> chest x-ray: Dense right lung infiltrate  COVID-19 medications: 12/14>> monoclonal antibody x1  Antibiotics: Meropenem: 1/7>> Vancomycin: 1/8>>  Microbiology data: 06/30/20>> blood culture: No growth 06/30/20>> urine culture: 100,000 colonies/mL gram-negative rods 06/19/2020>> urine culture: Achromobacter Xylosoxidans 05/30/20>> urine culture:  Achromobacter Xylosoxidans 05/30/20>> blood culture: No growth 05/21/20>> blood culture: No growth 05/21/20>> urine culture: No growth   Procedures: None  Consults: Palliative care, CCM, urology  DVT prophylaxis: SCDs Start: 06/30/20 1310-consider restarting SQ Lovenox or heparin in the next 24-48 hours if hematuria resolves.    Note-previously on Eliquis for VTE prophylaxis-has been discontinued due to development of hematuria.    Subjective:   No major issues overnight-lying comfortably in bed-amber-colored urine in Foley bag.   Assessment  & Plan :   SIRS due to complicated UTI and aspiration pneumonia: Slowly improving-afebrile but does have some mild leukocytosis.  Continue meropenem-add vancomycin-follow cultures and clinical course.  Acute hypoxic respiratory failure due to aspiration pneumonia: Briefly required up to 10 L  of oxygen last night-this morning-titrated down to 3-4 L of O2.  On vancomycin/meropenem-await cultures.  Evaluated briefly by PCCM yesterday-he does not have significant pleural effusion on ultrasound imaging.  Remains n.p.o. pending SLP evaluation.  Unfortunately he has had recurrent hospitalizations for this issue-given severe deconditioning/quadriplegia/cervical myelopathy at baseline-he will remain at risk for life-threatening and life disabling complications from aspiration.  Has had prior palliative care evaluations in the past-remains a full code.  Have reconsulted palliative care for goals of care-see below-re palliative care discussion  Note-spoke with respiratory therapist this morning-patient has been refusing nasotracheal suctioning at times-he has a very weak cough reflex-and if he continues to refuse NTS-no role for chest PT/vibrator vest etc.  Hematuria secondary to complicated UTI/Foley trauma-due to indwelling Foley catheter: Urine clearing this morning-see culture data as above-on empiric meropenem.  Admitting MD has consulted urology-await  further recommendations.  History of COVID-19 infection: Positive Covid PCR on admission is a sequelae of prior infection-doubt he requires any further treatment or airborne isolation for this issue.  Continue to observe-his hypoxia is due to aspiration pneumonia.    Dysphagia: Chronic issue-await SLP evaluation..  Quadriplegia with dysphagia-cervical myelopathy-s/p C-spine surgery on 10/13-subsequently sustained a fall at home and developed epidural hematoma-underwent reexploration/removal of hardware and evacuation of hematoma on 10/30.  Per prior documentation-plan is to leave cervical collar for 3 months total-till mid January.  Continues to have significant quadriplegia (able to move upper extremity side-by-side-unable to lift off the bed against gravity, able to only wiggle toes-unable to lift lower extremities off the bed against gravity)  HTN: BP remains stable on midodrine all BP medications on hold  Multifactorial anemia-anemia of chronic disease/vitamin B12 deficiency/borderline iron deficiency/acute blood loss due to hematuria: Hemoglobin at baseline-continue iron supplementation-repeat vitamin B12 levels with a.m. labs.  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  Chronic sacral decubitus ulcers KCL:EXNTZ sacral decubitus wound which is unstageable.  Await wound care evaluation  Palliative care/goals of care: Has had numerous palliative care evaluations in the past-remains a full code-this MD reached out to patient's niece who asked me to call patient's daughter-she will assessment who I spoke to this morning.  Explained poor prognosis-potential for life-threatening pneumonia/respiratory failure-however she remains resistant to palliative care-and conveyed to me that she believes in God and that "God is the healer" and "you are  just a vessel"   Patient's overall prognosis remains poor-he is at  significant risk for repeated hospitalizations due to recurrent aspirations pneumonia/UTI/sacral decubitus ulcer infection-which potentially could be life disabling or life-threatening.   Severe protein calorie malnutrition:Withseverecachexia weight loss.  Obesity: Estimated body mass index is 31.69 kg/m as calculated from the following:   Height as of this encounter: 6' (1.829 m).   Weight as of this encounter: 106 kg.   RN pressure injury documentation: Pressure Injury 05/23/20 Sacrum Unstageable - Full thickness tissue loss in which the base of the injury is covered by slough (yellow, tan, gray, green or brown) and/or eschar (tan, brown or black) in the wound bed. (Active)  05/23/20 1216  Location: Sacrum  Location Orientation:   Staging: Unstageable - Full thickness tissue loss in which the base of the injury is covered by slough (yellow, tan, gray, green or brown) and/or eschar (tan, brown or black) in the wound bed.  Wound Description (Comments):   Present on Admission: Yes     Pressure Injury 06/07/20 Throat Right Stage 3 -  Full thickness tissue loss. Subcutaneous fat may be visible but bone, tendon or muscle are NOT exposed. (Active)  06/07/20 0758  Location: Throat  Location Orientation: Right  Staging: Stage 3 -  Full thickness tissue loss. Subcutaneous fat may be visible but bone, tendon or muscle are NOT exposed.  Wound Description (Comments):   Present on Admission:      Pressure Injury 06/20/20 Vertebral column Lower;Medial Stage 2 -  Partial thickness loss of dermis presenting as a shallow open injury with a red, pink wound bed without slough. (Active)  06/20/20 0130  Location: Vertebral column  Location Orientation: Lower;Medial  Staging: Stage 2 -  Partial thickness loss of dermis presenting as a shallow open injury with a red, pink wound bed without slough.  Wound Description (Comments):   Present on Admission: Yes     Pressure Injury 06/22/20 Sacrum Posterior  Stage 4 - Full thickness tissue loss with exposed bone, tendon or muscle. PHYSICAL THERAPY/HYDROTHERAPY (Active)  06/22/20 1400  Location: Sacrum  Location Orientation: Posterior  Staging: Stage 4 - Full thickness tissue loss with exposed bone, tendon or muscle.  Wound Description (Comments): PHYSICAL THERAPY/HYDROTHERAPY  Present on Admission: Yes    GI prophylaxis: PPI  ABG:    Component Value Date/Time   PHART 7.504 (H) 05/03/2020 2303   PCO2ART 34.2 05/03/2020 2303   PO2ART 66.5 (L) 05/03/2020 2303   HCO3 31.4 (H) 05/21/2020 1544   TCO2 33 (H) 05/21/2020 1544   O2SAT 79.0 05/21/2020 1544    Vent Settings: N/A   Condition -Stable  Family Communication  : Niece- [name] Agard-[phone]-she lives locally on 1/8-she asked me to call patient's daughter [name] (decision [phone]-who I spoke to on 1/8 as well.  See palliative care discussion above  Code Status :  Full Code  Diet :  Diet Order            Diet NPO time specified  Diet effective now                  Disposition Plan  :   Status is: Inpatient  Remains inpatient appropriate because:Inpatient level of care appropriate due to severity of illness   Dispo: The patient is from: SNF              Anticipated d/c is to: SNF              Anticipated d/c date is: 3 day              Patient currently is medically stable to d/c.   Barriers to discharge: Acute hypoxic respiratory failure due to aspiration pneumonia  Antimicorbials  :    Anti-infectives (From admission, onward)   Start     Dose/Rate Route Frequency Ordered Stop   07/01/20 0900  vancomycin (VANCOREADY) IVPB 1500 mg/300 mL        1,500 mg 150 mL/hr over 120 Minutes Intravenous Every 12 hours 07/01/20 0843     06/30/20 2000  meropenem (MERREM) 2 g in sodium chloride 0.9 % 100 mL IVPB        2 g 200 mL/hr over 30 Minutes Intravenous Every 8 hours 06/30/20 1303     06/30/20 1115  meropenem (MERREM) 2 g in sodium chloride 0.9 % 100  mL IVPB        2 g 200 mL/hr over 30 Minutes Intravenous  Once 06/30/20 1036 06/30/20 1241      Inpatient Medications  Scheduled Meds: . sodium chloride flush  3 mL Intravenous Q12H   Continuous Infusions: . sodium chloride 75 mL/hr at 06/30/20 2132  . meropenem (MERREM) IV Stopped (07/01/20 0746)  . sodium chloride irrigation    . vancomycin     PRN Meds:.acetaminophen, albuterol, ondansetron **OR** ondansetron (ZOFRAN) IV   Time Spent in minutes  35  See all Orders from today for further details   [name] M.D on 07/01/2020 at 11:11 AM  To page go to [email or web address] - use universal password  [facility] -  Office  [phone]    Objective:   Vitals:   07/01/20 0800 07/01/20 0900 07/01/20 1000 07/01/20 1100  BP: 117/72 119/69 116/71 116/65  Pulse: (!) 110 (!) 107 (!) 109 (!) 107  Resp: 18 17 16 17   Temp:      TempSrc:      SpO2: 94% 96% 98% 97%  Weight:      Height:        Wt Readings from Last 3 Encounters:  06/30/20 106 kg  06/19/20 106 kg  06/02/20 106.6 kg     Intake/Output Summary (Last 24 hours) at 07/01/2020 1111 Last data filed at 07/01/2020 1110 Gross per 24 hour  Intake 399.76 ml  Output 2300 ml  Net -1900.24 ml     Physical Exam Gen Exam:Alert awake-not in any distress-very frail-debilitated HEENT:atraumatic, normocephalic Chest: Significant transmitted upper airway sounds-gurgling at times. CVS:S1S2 regular Abdomen:soft non tender, non distended Extremities:no edema Neurology: Baseline quadriparesis remains unchanged Skin: no rash   Data Review:    CBC Recent Labs  Lab 06/25/20 0304 06/30/20 1006 07/01/20 0617  WBC 9.2 9.7 13.1*  HGB 8.9* 9.9* 9.8*  HCT 29.1* 33.0* 32.3*  PLT 174 258 235  MCV 97.0 95.9 94.7  MCH 29.7 28.8 28.7  MCHC 30.6 30.0 30.3  RDW 15.4 14.6 14.3  LYMPHSABS  --  1.1  --   MONOABS  --  0.7  --   EOSABS  --  0.4  --   BASOSABS  --  0.1  --     Chemistries  Recent Labs  Lab  06/25/20 0304 06/30/20 1006 07/01/20 0617  NA 137 140 141  K 3.5 3.5 3.3*  CL 103 104 100  CO2 28 28 27   GLUCOSE 104* 101* 106*  BUN 9 8 9   CREATININE 0.35* 0.42* 0.52*  CALCIUM 8.0* 8.1* 7.9*  AST 19 20  --   ALT 12 10  --   ALKPHOS 81 98  --   BILITOT 0.4 0.9  --    ------------------------------------------------------------------------------------------------------------------ No results for input(s): CHOL, HDL, LDLCALC, TRIG, CHOLHDL, LDLDIRECT in the last 72 hours.  No results found for: HGBA1C ------------------------------------------------------------------------------------------------------------------ No results for input(s): TSH, T4TOTAL, T3FREE, THYROIDAB in the last 72 hours.  Invalid input(s): FREET3 ------------------------------------------------------------------------------------------------------------------ No results for input(s): VITAMINB12, FOLATE, FERRITIN, TIBC, IRON, RETICCTPCT in the last 72 hours.  Coagulation profile Recent Labs  Lab 06/30/20 1005  INR 1.2    No results for input(s): DDIMER in the last 72 hours.  Cardiac Enzymes No results for input(s): CKMB, TROPONINI, MYOGLOBIN in the last 168 hours.  Invalid input(s): CK ------------------------------------------------------------------------------------------------------------------    Component Value Date/Time   BNP 35.9 06/30/2020 1000    Micro Results Recent Results (from the past 240 hour(s))  Resp Panel by RT-PCR (Flu A&B, Covid) Nasopharyngeal Swab     Status: Abnormal   Collection Time: 06/26/20 11:30 AM   Specimen: Nasopharyngeal Swab; Nasopharyngeal(NP) swabs in vial transport medium  Result Value Ref Range Status  SARS Coronavirus 2 by RT PCR POSITIVE (A) NEGATIVE Final    Comment: RESULT CALLED TO, READ BACK BY AND VERIFIED WITH: FRANKLIN,D. RN @1315  ON 01.03.2022 BY COHEN,K (NOTE) SARS-CoV-2 target nucleic acids are DETECTED.  The SARS-CoV-2 RNA is generally  detectable in upper respiratory specimens during the acute phase of infection. Positive results are indicative of the presence of the identified virus, but do not rule out bacterial infection or co-infection with other pathogens not detected by the test. Clinical correlation with patient history and other diagnostic information is necessary to determine patient infection status. The expected result is Negative.  Fact Sheet for Patients: 03.03.2022  Fact Sheet for Healthcare Providers: BloggerCourse.com  This test is not yet approved or cleared by the [employer].[place] FDA and  has been authorized for detection and/or diagnosis of SARS-CoV-2 by FDA under an Emergency Use Authorization (EUA).  This EUA will remain in effect (meaning this  test can be used) for the duration of  the COVID-19 declaration under Section 564(b)(1) of the Act, 21 U.S.C. section 360bbb-3(b)(1), unless the authorization is terminated or revoked sooner.     Influenza A by PCR NEGATIVE NEGATIVE Final   Influenza B by PCR NEGATIVE NEGATIVE Final    Comment: (NOTE) The Xpert Xpress SARS-CoV-2/FLU/RSV plus assay is intended as an aid in the diagnosis of influenza from Nasopharyngeal swab specimens and should not be used as a sole basis for treatment. Nasal washings and aspirates are unacceptable for Xpert Xpress SARS-CoV-2/FLU/RSV testing.  Fact Sheet for Patients: [place]  Fact Sheet for Healthcare Providers: BloggerCourse.com  This test is not yet approved or cleared by the [employer]ious[employer]ker.it FDA and has been authorized for detection and/or diagnosis of SARS-CoV-2 by FDA under an Emergency Use Authorization (EUA). This EUA will remain in effect (meaning this test can be used) for the duration of the COVID-19 declaration under Section 564(b)(1) of the Act, 21 U.S.C. section 360bbb-3(b)(1), unless the  authorization is terminated or revoked.  Performed at [facility], [address], [place], [place]   Blood Culture (routine x 2)     Status: None (Preliminary result)   Collection Time: 06/30/20 10:05 AM   Specimen: BLOOD  Result Value Ref Range Status   Specimen Description BLOOD RIGHT ANTECUBITAL  Final   Special Requests   Final    BOTTLES DRAWN AEROBIC AND ANAEROBIC Blood Culture adequate volume   Culture   Final    NO GROWTH < 24 HOURS Performed at [facility], [address] [place], [place]    Report Status PENDING  Incomplete  Blood Culture (routine x 2)     Status: None (Preliminary result)   Collection Time: 06/30/20 10:06 AM   Specimen: BLOOD LEFT FOREARM  Result Value Ref Range Status   Specimen Description BLOOD LEFT FOREARM  Final   Special Requests   Final    BOTTLES DRAWN AEROBIC AND ANAEROBIC Blood Culture adequate volume   Culture   Final    NO GROWTH < 24 HOURS Performed at [facility], [address] [place], [place]    Report Status PENDING  Incomplete  Urine culture     Status: Abnormal (Preliminary result)   Collection Time: 06/30/20 10:47 AM   Specimen: In/Out Cath Urine  Result Value Ref Range Status   Specimen Description IN/OUT CATH URINE  Final   Special Requests NONE  Final   Culture (A)  Final    >=100,000  COLONIES/mL GRAM NEGATIVE RODS SUSCEPTIBILITIES TO FOLLOW Performed at [facility], [address] [place], [place] [address]    Report Status PENDING  Incomplete    Radiology Reports CT ABDOMEN PELVIS WO CONTRAST  Result Date: 06/19/2020 CLINICAL DATA:  Abdominal pain, hematuria since Thursday. Suprapubic catheter in place. RIGHT lower quadrant abdominal pain. COVID positive. EXAM: CT ABDOMEN AND PELVIS WITHOUT CONTRAST TECHNIQUE: Multidetector CT imaging of the abdomen and pelvis was performed following the standard protocol without IV contrast. COMPARISON:   Chest CT dated 05/03/2020. FINDINGS: Lower chest: Patchy small consolidations at the bilateral lung bases, RIGHT greater than LEFT. RIGHT pleural effusion, small to moderate in size. Hepatobiliary: No focal liver abnormality is seen. 1.1 cm gallstone. Gallbladder appears otherwise unremarkable. No bile duct dilatation is seen. Pancreas: Diffusely infiltrated with fat but otherwise unremarkable. No peripancreatic fluid. Spleen: Normal in size without focal abnormality. Adrenals/Urinary Tract: Adrenal glands are unremarkable. 2 LEFT renal stones, each measuring 2-3 mm, nonobstructive. Single 3 mm nonobstructing RIGHT renal stone. No hydronephrosis is seen bilaterally. No ureteral or bladder calculi are identified. Bladder is decompressed by Foley catheter. Contents of the bladder are hyperdense, presumably blood products. Stomach/Bowel: No dilated large or small bowel loops. Diverticulosis of the sigmoid and descending colon but no focal inflammatory changes seen to suggest acute diverticulitis. Moderate amount of stool and gas in the ascending and transverse colon. Stomach is unremarkable, partially decompressed. Vascular/Lymphatic: Aortic atherosclerosis. No enlarged lymph nodes are seen within the abdomen or pelvis. Reproductive: Prostate is unremarkable. Other: No free fluid or abscess collection is seen. No free intraperitoneal air. Musculoskeletal: No acute or suspicious osseous finding. Degenerative spondylosis at the L1-2 and L2-3 levels, moderate in degree with associated disc space narrowing and osseous spurring. Visualized soft tissues of the abdominal wall are unremarkable. Gas-like collection overlying the coccyx and lower sacrum. IMPRESSION: 1. Patchy small consolidations at the bilateral lung bases, RIGHT greater than LEFT, suggesting COVID-19 pneumonia. RIGHT pleural effusion, small to moderate in size. 2. Bladder is decompressed by Foley catheter. Contents of the bladder are hyperdense, presumably  blood products. 3. Bilateral nephrolithiasis (largest stone measuring 3 mm). No ureteral or bladder calculi. No hydronephrosis. 4. Colonic diverticulosis without evidence of acute diverticulitis. 5. Cholelithiasis without evidence of acute cholecystitis. 6. Ill-defined collection of air overlying the coccyx and lower sacrum, possibly superficial to the patient but concerning for sequela of sacral decubitus ulcer or other infectious process of the soft tissues overlying the sacrum/coccyx. Clinical correlation recommended. Aortic Atherosclerosis (ICD10-I70.0). Electronically Signed   By: Bary Richard M.D.   On: 06/19/2020 15:49   DG Chest Portable 1 View  Result Date: 06/30/2020 CLINICAL DATA:  COVID-19 positive patient with productive cough. EXAM: PORTABLE CHEST 1 VIEW COMPARISON:  Single-view of the chest 06/19/2020 and 06/06/2020. FINDINGS: Right pleural effusion and basilar airspace disease have markedly worsened. The patient has a very small left pleural effusion and mild basilar airspace disease. No pneumothorax. Heart size normal. IMPRESSION: Marked worsening of a right pleural effusion which appears moderate in size and right basilar airspace disease. Trace left pleural effusion is noted. Electronically Signed   By: Drusilla Kanner M.D.   On: 06/30/2020 10:00   DG Chest Port 1 View  Result Date: 06/19/2020 CLINICAL DATA:  cough EXAM: PORTABLE CHEST 1 VIEW COMPARISON:  06/06/2020 and prior. FINDINGS: Decreased bibasilar opacities and small pleural effusions. No pneumothorax. Cardiomediastinal silhouette within normal limits. Left shoulder osteoarthrosis. IMPRESSION: Decreased bibasilar opacities and small pleural effusions. Electronically  Signed   By: Stana Buntinghikanele  Emekauwa M.D.   On: 06/19/2020 14:40   DG CHEST PORT 1 VIEW  Result Date: 06/06/2020 CLINICAL DATA:  Hypoxia EXAM: PORTABLE CHEST 1 VIEW COMPARISON:  05/31/2020 and prior FINDINGS: No pneumothorax. Patchy bibasilar opacities and small  effusions, unchanged. Stable cardiomediastinal silhouette and osseous structures. IMPRESSION: Bibasilar opacities and small effusions, unchanged. Electronically Signed   By: Stana Buntinghikanele  Emekauwa M.D.   On: 06/06/2020 19:17   DG Chest Port 1V same Day  Result Date: 07/01/2020 CLINICAL DATA:  Shortness of breath.  COVID positive. EXAM: PORTABLE CHEST 1 VIEW COMPARISON:  Chest x-ray dated 06/30/2020. FINDINGS: Heart size and mediastinal contours are stable. Small pleural effusion and/or atelectasis at the LEFT lung base. Stable opacity at the RIGHT lung base, moderate-sized pleural effusion and/or atelectasis or pneumonia. No pneumothorax is seen. IMPRESSION: 1. Dense opacity at the RIGHT lung base, stable compared to most recent chest x-ray of 06/30/2020, compatible with moderate-sized pleural effusion with underlying atelectasis or pneumonia. 2. Small pleural effusion and/or atelectasis at the LEFT lung base. Electronically Signed   By: Bary RichardStan  Maynard M.D.   On: 07/01/2020 07:54   DG Swallowing Func-Speech Pathology  Result Date: 06/05/2020 Objective Swallowing Evaluation: Type of Study: MBS-Modified Barium Swallow Study  Patient Details Name: Shannon [name] MRN: [id number] Date of Birth: [birth year] Today's Date: 06/05/2020 Time: SLP Start Time (ACUTE ONLY): 1300 -SLP Stop Time (ACUTE ONLY): 1316 SLP Time Calculation (min) (ACUTE ONLY): 16 min Past Medical History: Past Medical History: Diagnosis Date . Asthma  . 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 Newell CoralNudelman, 1992-->[facility]  Summer 2021->Cervicalgia,arm pain, hand atrophy RUE wkness, hyperreflex (Dr. 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 Hay fever  . Hypertension  . Osteoarthritis of both hands  Past Surgical History: Past Surgical History: Procedure Laterality Date . ANTERIOR CERVICAL DECOMP/DISCECTOMY  FUSION N/A 04/05/2020  Procedure: ANTERIOR CERVICAL DECOMPRESSION/DISCECTOMY FUSION, INTERBODY PROSTHESIS, PLATE/SCREWS CERVICAL THREE-CERVICAL FOUR, CERVICAL FOUR- CERVICAL FIVE;  Surgeon: Tressie StalkerJenkins, Jeffrey, MD;  [facility];  Service: Neurosurgery;  Laterality: N/A; . ANTERIOR CERVICAL DECOMP/DISCECTOMY FUSION N/A 04/22/2020  Procedure: Reexploration of anterior cervical wound for epidural hematoma;  Surgeon: Donalee Citrinram, Gary, MD;  [facility];  Service: Neurosurgery;  Laterality: N/A; . BACK SURGERY   . INCISION AND DRAINAGE Left 2012  L hand infection . ROTATOR CUFF REPAIR Right   x 3 . SPINE SURGERY   HPI: Marrian SalvageRonald Ray Smithis a 75 y.o.malewith medical history significant ofquadriplegia which follows surgery for Chiari I malformation with syringomyelia surgery in October 2021.  He originally had a C3-4 [place] 4-5 anterior cervical disc ACDF with decompression, C3 3-4 and C4-5 interbody arthrodesis with local autograft bone, anterior cervical plating of C3-C5 on 04/05/2020.  He then went home and had a fall from his bed, he was then hospitalized and from here he was sent to a nursing home, he has been quadriplegic since then, he has also developed sacral decubitus ulcers. Was seen for MBS on 05/04/20 "deficits resulted in silent aspiration of thin liquid and nectar thick liquid by straw prior to the swallow. With nectar thick liquid by cup, only transient penetration was seen. There was significant pharyngeal residue with puree and solid consistencies which was reduced but not fully cleared with liquid wash." Now admitted for AMS due to UTI, sepsis and hypotension in a patient with indwelling Foley  catheter POA and sacral decubitus ulcers POA who is quadriplegic. Pt/niece preferred to remain on nectar thick full liquids- pt stable and ST signed off. Re-ordered by hospitalist "suspicion for aspiration pna".   Subjective: alert, cooperative, pleasant, participative. Assessment / Plan / Recommendation [facility]L IP  CLINICAL IMPRESSIONS 06/05/2020 Clinical Impression Pt demonstrates normal swallow ability. No penetration, aspiration or residue was observed. Pt at times had mild spillage of oral residue to pharynx which he sensed and swallowed, not outside of normal limits. He is able to masticate and esophageal sweep with solids appeared WNL. Will initiate a regular diet and thin liquids to allow foods of choice though pt will need total assist feeding. Will f/u for tolerance. SLP Visit Diagnosis Dysphagia, unspecified (R13.10) Attention and concentration deficit following -- Frontal lobe and executive function deficit following -- Impact on safety and function Mild aspiration risk;Risk for inadequate nutrition/hydration   CHL IP TREATMENT RECOMMENDATION 06/05/2020 Treatment Recommendations Therapy as outlined in treatment plan below   Prognosis 06/05/2020 Prognosis for Safe Diet Advancement Good Barriers to Reach Goals -- Barriers/Prognosis Comment -- [facility]L IP DIET RECOMMENDATION 06/05/2020 SLP Diet Recommendations Regular solids;Thin liquid Liquid Administration via Cup;Straw Medication Administration Whole meds with liquid Compensations Slow rate;Small sips/bites;Follow solids with liquid Postural Changes --   CHL IP OTHER RECOMMENDATIONS 06/05/2020 Recommended Consults -- Oral Care Recommendations Oral care BID Other Recommendations --   [facility] IP FOLLOW UP RECOMMENDATIONS 06/05/2020 Follow up Recommendations Skilled Nursing facility   [facility] IP FREQUENCY AND DURATION 06/05/2020 Speech Therapy Frequency (ACUTE ONLY) min 2x/week Treatment Duration 2 weeks      CHL IP ORAL PHASE 06/05/2020 Oral Phase WFL Oral - Pudding Teaspoon -- Oral - Pudding Cup -- Oral - Honey Teaspoon -- Oral - Honey Cup -- Oral - Nectar Teaspoon -- Oral - Nectar Cup -- Oral - Nectar Straw -- Oral - Thin Teaspoon -- Oral - Thin Cup -- Oral - Thin Straw -- Oral - Puree -- Oral - Mech Soft -- Oral - Regular -- Oral - Multi-Consistency -- Oral - Pill -- Oral Phase  - Comment --  CHL IP PHARYNGEAL PHASE 06/05/2020 Pharyngeal Phase WFL Pharyngeal- Pudding Teaspoon -- Pharyngeal -- Pharyngeal- Pudding Cup -- Pharyngeal -- Pharyngeal- Honey Teaspoon -- Pharyngeal -- Pharyngeal- Honey Cup -- Pharyngeal -- Pharyngeal- Nectar Teaspoon -- Pharyngeal -- Pharyngeal- Nectar Cup -- Pharyngeal -- Pharyngeal- Nectar Straw -- Pharyngeal -- Pharyngeal- Thin Teaspoon -- Pharyngeal -- Pharyngeal- Thin Cup -- Pharyngeal -- Pharyngeal- Thin Straw -- Pharyngeal -- Pharyngeal- Puree -- Pharyngeal -- Pharyngeal- Mechanical Soft -- Pharyngeal -- Pharyngeal- Regular -- Pharyngeal -- Pharyngeal- Multi-consistency -- Pharyngeal -- Pharyngeal- Pill -- Pharyngeal -- Pharyngeal Comment --  CHL IP CERVICAL ESOPHAGEAL PHASE 06/05/2020 Cervical Esophageal Phase -- Pudding Teaspoon -- Pudding Cup -- Honey Teaspoon -- Honey Cup -- Nectar Teaspoon -- Nectar Cup -- Nectar Straw -- Thin Teaspoon -- Thin Cup WFL Thin Straw WFL Puree WFL Mechanical Soft WFL Regular -- Multi-consistency -- Pill WFL Cervical Esophageal Comment -- Harlon Ditty, MA CCC-SLP Acute Rehabilitation Services Pager [phone] Office [phone] Claudine Mouton 06/05/2020, 1:53 PM

## 2020-07-01 NOTE — Progress Notes (Signed)
Patient admitted from ED. Vitals Done.Oriented to room.Patient daughter on phone and informed of patient new location.

## 2020-07-01 NOTE — ED Notes (Signed)
Pt O2 at 76%. RT called and told writer to place to pt on NRB and turn up O2 to 15L. Pt O2 at 84%.

## 2020-07-02 DIAGNOSIS — J690 Pneumonitis due to inhalation of food and vomit: Secondary | ICD-10-CM | POA: Diagnosis not present

## 2020-07-02 LAB — CBC
HCT: 29.3 % — ABNORMAL LOW (ref 39.0–52.0)
Hemoglobin: 9.1 g/dL — ABNORMAL LOW (ref 13.0–17.0)
MCH: 29.1 pg (ref 26.0–34.0)
MCHC: 31.1 g/dL (ref 30.0–36.0)
MCV: 93.6 fL (ref 80.0–100.0)
Platelets: 206 10*3/uL (ref 150–400)
RBC: 3.13 MIL/uL — ABNORMAL LOW (ref 4.22–5.81)
RDW: 14.3 % (ref 11.5–15.5)
WBC: 13 10*3/uL — ABNORMAL HIGH (ref 4.0–10.5)
nRBC: 0 % (ref 0.0–0.2)

## 2020-07-02 LAB — COMPREHENSIVE METABOLIC PANEL
ALT: 9 U/L (ref 0–44)
AST: 15 U/L (ref 15–41)
Albumin: 1.3 g/dL — ABNORMAL LOW (ref 3.5–5.0)
Alkaline Phosphatase: 84 U/L (ref 38–126)
Anion gap: 13 (ref 5–15)
BUN: 9 mg/dL (ref 8–23)
CO2: 24 mmol/L (ref 22–32)
Calcium: 7.8 mg/dL — ABNORMAL LOW (ref 8.9–10.3)
Chloride: 105 mmol/L (ref 98–111)
Creatinine, Ser: 0.61 mg/dL (ref 0.61–1.24)
GFR, Estimated: >60 mL/min (ref >60)
Glucose, Bld: 83 mg/dL (ref 70–99)
Potassium: 3.4 mmol/L — ABNORMAL LOW (ref 3.5–5.1)
Sodium: 142 mmol/L (ref 135–145)
Total Bilirubin: 1.4 mg/dL — ABNORMAL HIGH (ref 0.3–1.2)
Total Protein: 5.5 g/dL — ABNORMAL LOW (ref 6.5–8.1)

## 2020-07-02 LAB — URINE CULTURE: Culture: >=100000 — AB

## 2020-07-02 LAB — VITAMIN B12: Vitamin B-12: 456 pg/mL (ref 180–914)

## 2020-07-02 NOTE — Progress Notes (Signed)
  Speech Language Pathology Treatment: Dysphagia  Patient Details Name: Shannon [name] MRN: [id number] DOB: [birth year] Today's Date: 07/02/2020 Time: 9678-9381 SLP Time Calculation (min) (ACUTE ONLY): 13 min  Assessment / Plan / Recommendation Clinical Impression  Pt has immediate coughing and multiple, audible subswallows per bolus with thin liquids, concerning for dysphagia as well as reduced airway protection. He had one particularly prolonged cough response after his first sip of water, after which it took him some time to catch his breath and allow coughing to subside. No overt coughing is noted with purees or ice chips, but pt also has c/o something being "stuck" in his throat. Pt believes it feels like a pill as opposed to the puree he swallowed, and he did just take PO meds from RN right before SLP had arrived. Recommend that he remain NPO pending completion of MBS, likely to be completed on next date at the earliest. If essential meds have to be given PO would crush them in puree (pt does not like applesauce).    HPI HPI: Shannon [name] is a 75 y.o. 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  He originally had a C3-4 [place] 4-5 anterior cervical disc ACDF with decompression, C3 3-4 and C4-5 interbody arthrodesis with local autograft bone, anterior cervical plating of C3-C5 on 04/05/2020.  He then went home and had a fall from his bed, he was then hospitalized and from here he was sent to a nursing home, he has been quadriplegic since then, he has also developed sacral decubitus ulcers. Was seen for MBS on 05/04/20 "deficits resulted in silent aspiration of thin liquid and nectar thick liquid by straw prior to the swallow. With nectar thick liquid by cup, only transient penetration was seen. There was significant pharyngeal residue with puree and solid consistencies which was reduced but not fully  cleared with liquid wash." Pt was then admitted for AMS due to UTI, sepsis and hypotension and underwent mbs with normal swallow. Pt now admitted with hematuria - Also diagnosed with COVID 06/11/2020.  MD ordered swallow eval currently due to"suspicion for aspiration pna".  Pt has remained on precautions due to his continued cough. Pt reports he only coughs when he is being moved around.      SLP Plan  Continue with current plan of care       Recommendations  Diet recommendations: NPO Medication Administration: Via alternative means (crushed in puree if necessary to take PO)                Oral Care Recommendations: Oral care QID Follow up Recommendations: Skilled Nursing facility SLP Visit Diagnosis: Dysphagia, unspecified (R13.10) Plan: Continue with current plan of care       GO                Mahala Menghini., M.A. CCC-SLP Acute Rehabilitation Services Pager [phone] Office [phone]  07/02/2020, 10:13 AM

## 2020-07-02 NOTE — Progress Notes (Signed)
Pt HR has been consistant 126 130's MD X.Blount was notified by Amion no new orders were given will continue to monitor,

## 2020-07-02 NOTE — Progress Notes (Signed)
PROGRESS NOTE  Shannon [name]  DOB: Nov 16, 1945  PCP: [name], MD YJE:[id number]  DOA: 06/30/2020  LOS: 2 days   Chief Complaint  Patient presents with  . Hematuria    Brief narrative: Shannon [name] is a 75 y.o. 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  Then he had a fall at home developed epidural hematoma, underwent reexploration, removal of hardware, evacuation of hematoma on 10/30, now quadriplegic with dysphagia, unstageable sacral decubitus wounds, failure to thrive-with numerous recent hospitalizations for aspiration pneumonia/hematuria/UTI.   Patient was brought to the ED from SNF for evaluation of recurrent hematuria, cough, found to have acute hypoxic respiratory failure due to aspiration pneumonia, probable complicated UTI-and subsequently admitted to the hospitalist service.   Of note, patient was diagnosed with COVID-19 during his prior hospitalization on 12/14.  He received monoclonal antibody.  He continues to have a positive PCR but does not have Covid infection.  Subjective: Patient was seen and examined this morning. Elderly male.  Lying on bed.  Alert, able to answer questions.  Answers clearly 'Yes' when asked about the possibility of PEG tube placement.  Assessment/Plan: SIRS due to complicated UTI and aspiration pneumonia:  -Slowly improving-afebrile but does have some mild leukocytosis. Continue meropenem and vancomycin. -Follow-up culture reports. Recent Labs  Lab 06/30/20 1006 07/01/20 0617 07/02/20 0104  WBC 9.7 13.1* 13.0*  LATICACIDVEN 1.3  --   --   PROCALCITON <0.10  --   --    Acute hypoxic respiratory failure due to aspiration pneumonia:  -Briefly required up to 10 L of oxygen initially.  Later titrated down.   -Remains n.p.o. per speech therapy evaluation.   -Has had prior palliative care evaluations in the  past-remains a full code.  Have reconsulted palliative care for goals of care-see below-re palliative care discussion  Hematuria secondary to complicated UTI Foley trauma-due to indwelling Foley catheter:  -Urine is clear now.  History of COVID-19 infection:  -Positive Covid PCR on admission is a sequelae of prior infection-doubt he requires any further treatment or airborne isolation for this issue.  Continue to observe-his hypoxia is due to aspiration pneumonia.    Dysphagia: Chronic issue-SLP evaluation.  Obtained.  Patient agrees to a PEG tube placement if needed.  Quadriplegia with dysphagia-cervical myelopathy-s/p C-spine surgery on 10/13-subsequently sustained a fall at home and developed epidural hematoma-underwent reexploration/removal of hardware and evacuation of hematoma on 10/30.  Per prior documentation-plan is to leave cervical collar for 3 months total-till mid January.  Continues to have significant quadriplegia (able to move upper extremity side-by-side-unable to lift off the bed against gravity, able to only wiggle toes-unable to lift lower extremities off the bed against gravity)  HTN: BP remains stable on midodrine all BP medications on hold  Multifactorial anemia-anemia of chronic disease/vitamin B12 deficiency/borderline iron deficiency/acute blood loss due to hematuria:  -Hemoglobin at baseline-continue iron supplementation Recent Labs    05/22/20 1304 05/23/20 0432 06/07/20 0032 06/07/20 0739 06/24/20 0306 06/25/20 0304 06/30/20 1006 07/01/20 0617 07/02/20 0104  HGB  --    < >  --    < > 8.6* 8.9* 9.9* 9.8* 9.1*  MCV  --    < >  --    < > 97.6 97.0 95.9 94.7 93.6  VITAMINB12 160*  --   --   --   --   --   --   --  456  FOLATE 5.9*  --   --   --   --   --   --   --   --   FERRITIN 89  --  907*  --   --   --   --   --   --   TIBC 150*  --   --   --   --   --   --   --   --   IRON 25*  --   --   --   --   --   --   --   --   RETICCTPCT 3.9*  --   --    --   --   --   --   --   --    < > = values in this interval not displayed.   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  Chronic sacral decubitus ulcers POA: Large sacral decubitus wound which is unstageable.  Await wound care evaluation  Palliative care/goals of care: Has had numerous palliative care evaluations in the past-remains a full code-this MD reached out to patient's niece who asked me to call patient's daughter-she will assessment who I spoke to this morning.  Explained poor prognosis-potential for life-threatening pneumonia/respiratory failure-however she remains resistant to palliative care-and states that she believes in God and that "God is the healer" and "you are  just a vessel"   Patient's overall prognosis remains poor-he is at significant risk for repeated hospitalizations due to recurrent aspirations pneumonia/UTI/sacral decubitus ulcer infection-which potentially could be life disabling or life-threatening.   Severe protein calorie malnutrition:Withseverecachexia weight loss.  Mobility: Quadriplegia baseline Code Status:   Code Status: Full Code  Nutritional status: Body mass index is 31.69 kg/m.     Diet Order            Diet NPO time specified  Diet effective now                 DVT prophylaxis: SCDs Start: 06/30/20 1310   Antimicrobials:  IV meropenem, IV vancomycin Fluid: Normal saline 75 mill per hour Consultants: None Family Communication:  Family not at bedside  Status is: Inpatient  Remains inpatient appropriate because: Ongoing sepsis work-up, needs broad-spectrum antibiotics   Dispo: The patient is from: SNF               Anticipated d/c is to: SNF              Anticipated d/c date is: > 3 days              Patient currently is not medically stable to d/c.       Infusions:  . sodium chloride 75 mL/hr at 07/02/20 0452  . meropenem (MERREM)  IV 2 g (07/02/20 0508)  . sodium chloride irrigation    . vancomycin 1,500 mg (07/02/20 0936)    Scheduled Meds: . acetaminophen  1,000 mg Oral TID  . ascorbic acid  1,000 mg Oral Daily  . Chlorhexidine Gluconate Cloth  6 each Topical Daily  . escitalopram  2.5 mg Oral QHS  . feeding supplement  237 mL Oral QID  . ferrous sulfate  325 mg Oral BID WC  . gabapentin  200 mg Oral BID  . midodrine  10 mg Oral BID WC  . pantoprazole  40 mg Oral QHS  . polyethylene glycol  17 g Oral q morning -  10a  . scopolamine  1 patch Transdermal Q72H  . sodium chloride flush  3 mL Intravenous Q12H  . tamsulosin  0.4 mg Oral Daily    Antimicrobials: Anti-infectives (From admission, onward)   Start     Dose/Rate Route Frequency Ordered Stop   07/01/20 0900  vancomycin (VANCOREADY) IVPB 1500 mg/300 mL        1,500 mg 150 mL/hr over 120 Minutes Intravenous Every 12 hours 07/01/20 0843     06/30/20 2000  meropenem (MERREM) 2 g in sodium chloride 0.9 % 100 mL IVPB        2 g 200 mL/hr over 30 Minutes Intravenous Every 8 hours 06/30/20 1303     06/30/20 1115  meropenem (MERREM) 2 g in sodium chloride 0.9 % 100 mL IVPB        2 g 200 mL/hr over 30 Minutes Intravenous  Once 06/30/20 1036 06/30/20 1241      PRN meds: acetaminophen, albuterol, ondansetron **OR** ondansetron (ZOFRAN) IV, oxyCODONE   Objective: Vitals:   07/02/20 0731 07/02/20 1148  BP: 109/65 113/65  Pulse: (!) 104 100  Resp: 18 16  Temp: 97.8 F (36.6 C) 97.8 F (36.6 C)  SpO2: 98% 98%    Intake/Output Summary (Last 24 hours) at 07/02/2020 1220 Last data filed at 07/02/2020 0508 Gross per 24 hour  Intake 400 ml  Output 1200 ml  Net -800 ml   Filed Weights   06/30/20 1143  Weight: 106 kg   Weight change:  Body mass index is 31.69 kg/m.   Physical Exam: General exam: Elderly Caucasian male.  Lying on bed.   Skin: No rashes, lesions or ulcers. HEENT: Has cervical collar in place Lungs: Clear to auscultation  bilaterally CVS: Regular rate and rhythm, normal GI/Abd soft, nontender, nondistended, bowel sound present CNS: Alert, awake.  Quadriplegia at baseline Psychiatry: Mood appropriate Extremities: No pedal edema, no calf tenderness  Data Review: I have personally reviewed the laboratory data and studies available.  Recent Labs  Lab 06/30/20 1006 07/01/20 0617 07/02/20 0104  WBC 9.7 13.1* 13.0*  NEUTROABS 7.4  --   --   HGB 9.9* 9.8* 9.1*  HCT 33.0* 32.3* 29.3*  MCV 95.9 94.7 93.6  PLT 258 235 206   Recent Labs  Lab 06/30/20 1006 07/01/20 0617 07/02/20 0104  NA 140 141 142  K 3.5 3.3* 3.4*  CL 104 100 105  CO2 28 27 24   GLUCOSE 101* 106* 83  BUN 8 9 9   CREATININE 0.42* 0.52* 0.61  CALCIUM 8.1* 7.9* 7.8*    F/u labs ordered  Signed, , MD [facility] 07/02/2020

## 2020-07-03 ENCOUNTER — Inpatient Hospital Stay (HOSPITAL_COMMUNITY): Payer: Medicare Other

## 2020-07-03 ENCOUNTER — Encounter (HOSPITAL_COMMUNITY): Payer: Self-pay | Admitting: Internal Medicine

## 2020-07-03 DIAGNOSIS — J690 Pneumonitis due to inhalation of food and vomit: Secondary | ICD-10-CM | POA: Diagnosis not present

## 2020-07-03 LAB — CBC WITH DIFFERENTIAL/PLATELET
Abs Immature Granulocytes: 0.08 10*3/uL — ABNORMAL HIGH (ref 0.00–0.07)
Basophils Absolute: 0 10*3/uL (ref 0.0–0.1)
Basophils Relative: 0 %
Eosinophils Absolute: 0 10*3/uL (ref 0.0–0.5)
Eosinophils Relative: 0 %
HCT: 29.3 % — ABNORMAL LOW (ref 39.0–52.0)
Hemoglobin: 8.8 g/dL — ABNORMAL LOW (ref 13.0–17.0)
Immature Granulocytes: 1 %
Lymphocytes Relative: 7 %
Lymphs Abs: 0.9 10*3/uL (ref 0.7–4.0)
MCH: 28.6 pg (ref 26.0–34.0)
MCHC: 30 g/dL (ref 30.0–36.0)
MCV: 95.1 fL (ref 80.0–100.0)
Monocytes Absolute: 0.8 10*3/uL (ref 0.1–1.0)
Monocytes Relative: 6 %
Neutro Abs: 10.1 10*3/uL — ABNORMAL HIGH (ref 1.7–7.7)
Neutrophils Relative %: 86 %
Platelets: 216 10*3/uL (ref 150–400)
RBC: 3.08 MIL/uL — ABNORMAL LOW (ref 4.22–5.81)
RDW: 14.2 % (ref 11.5–15.5)
WBC: 11.9 10*3/uL — ABNORMAL HIGH (ref 4.0–10.5)
nRBC: 0 % (ref 0.0–0.2)

## 2020-07-03 LAB — COMPREHENSIVE METABOLIC PANEL
ALT: 8 U/L (ref 0–44)
AST: 16 U/L (ref 15–41)
Albumin: 1.3 g/dL — ABNORMAL LOW (ref 3.5–5.0)
Alkaline Phosphatase: 83 U/L (ref 38–126)
Anion gap: 11 (ref 5–15)
BUN: 6 mg/dL — ABNORMAL LOW (ref 8–23)
CO2: 25 mmol/L (ref 22–32)
Calcium: 8.1 mg/dL — ABNORMAL LOW (ref 8.9–10.3)
Chloride: 110 mmol/L (ref 98–111)
Creatinine, Ser: 0.65 mg/dL (ref 0.61–1.24)
GFR, Estimated: >60 mL/min (ref >60)
Glucose, Bld: 79 mg/dL (ref 70–99)
Potassium: 3.2 mmol/L — ABNORMAL LOW (ref 3.5–5.1)
Sodium: 146 mmol/L — ABNORMAL HIGH (ref 135–145)
Total Bilirubin: 1.1 mg/dL (ref 0.3–1.2)
Total Protein: 5.6 g/dL — ABNORMAL LOW (ref 6.5–8.1)

## 2020-07-03 LAB — GLUCOSE, CAPILLARY: Glucose-Capillary: 96 mg/dL (ref 70–99)

## 2020-07-03 IMAGING — US IR CHEST US
1 series · 4 of 4 positions shown · non-contrast
Comparison: Chest x-ray [DATE]

CLINICAL DATA: Suspected pleural effusion

EXAM:
CHEST ULTRASOUND

[Series 1: ir (id) (id)/(id)/(id) ir · 4 of 4 slices shown]
[im 1/4]
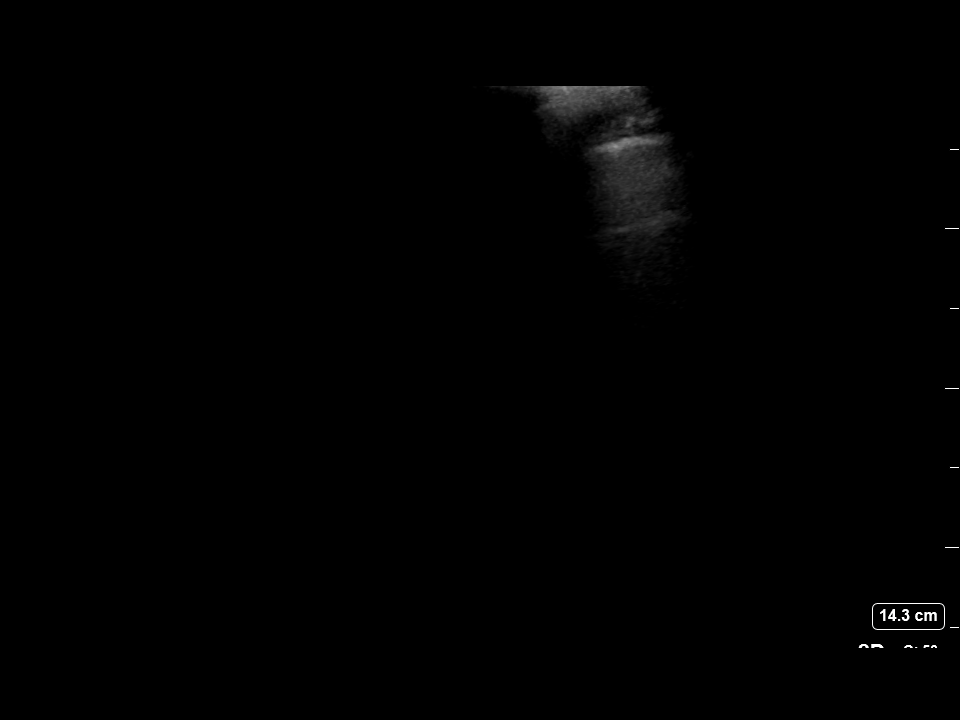
[im 2/4]
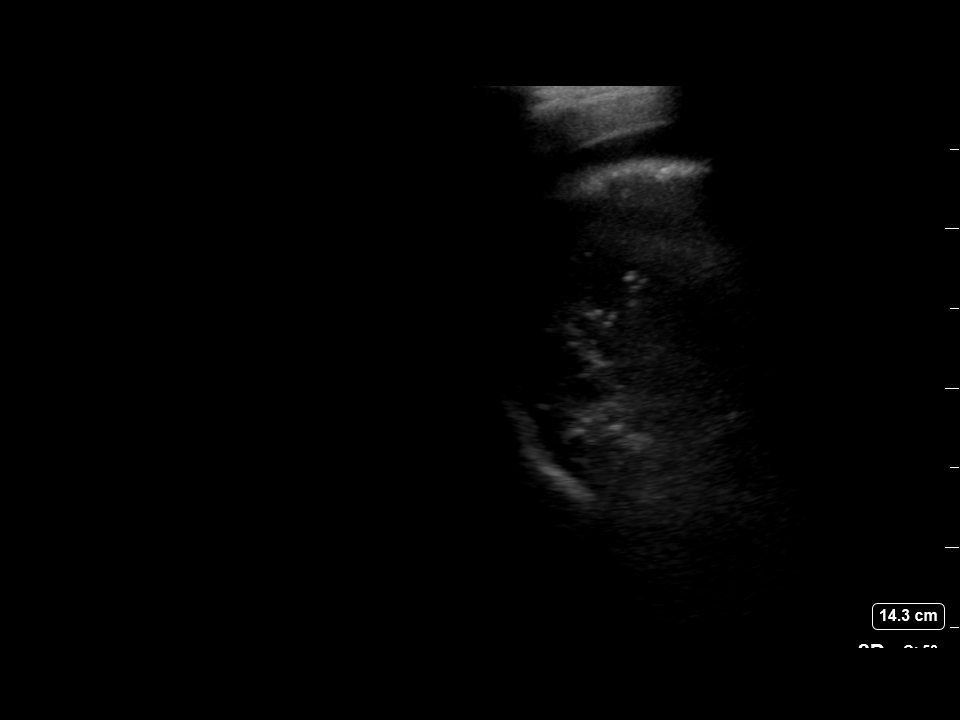
[im 3/4]
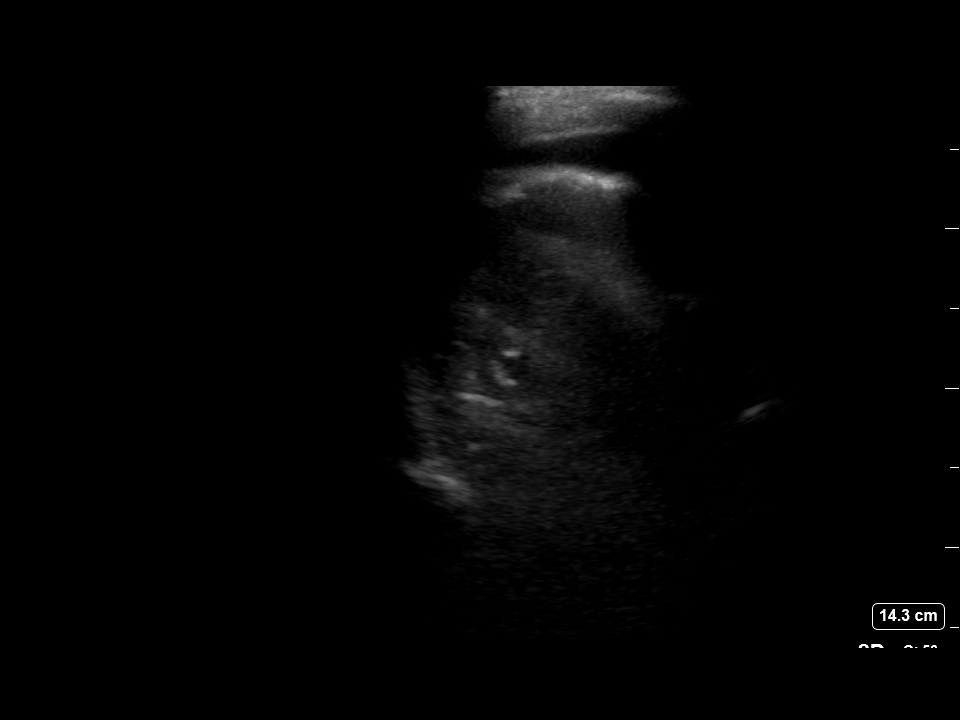
[im 4/4]
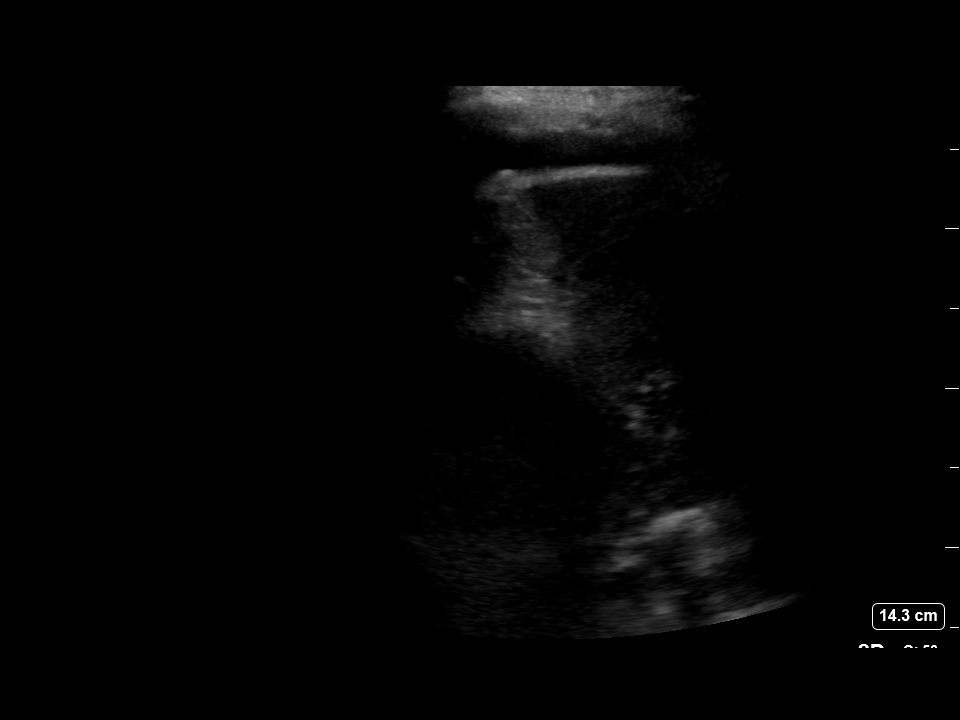

[4 of 4 positions shown; findings below may reference images not displayed]

FINDINGS: Ultrasound evaluation of the chest demonstrates trace pleural fluid,
insufficient to attempt thoracentesis.
IMPRESSION: Trace pleural fluid, insufficient for thoracentesis.

## 2020-07-03 MED ORDER — VANCOMYCIN HCL 1500 MG/300ML IV SOLN: 1500.0000 mg | INTRAVENOUS | Status: DC

## 2020-07-03 MED ORDER — ORAL CARE MOUTH RINSE
15.0000 mL | Freq: Two times a day (BID) | OROMUCOSAL | Status: DC
  Administered 2020-07-03 – 2020-07-11 (×15): 15 mL via OROMUCOSAL

## 2020-07-03 MED ORDER — JUVEN PO PACK
1.0000 | PACK | Freq: Two times a day (BID) | ORAL | Status: DC
  Administered 2020-07-03 – 2020-07-11 (×14): 1
  Filled 2020-07-03 (×14): qty 1

## 2020-07-03 MED ORDER — COLLAGENASE 250 UNIT/GM EX OINT
TOPICAL_OINTMENT | Freq: Every day | CUTANEOUS | Status: DC
  Filled 2020-07-03 (×2): qty 30

## 2020-07-03 MED ORDER — OSMOLITE 1.5 CAL PO LIQD
1000.0000 mL | ORAL | Status: DC
  Administered 2020-07-03 – 2020-07-11 (×5): 1000 mL
  Filled 2020-07-03 (×13): qty 1000

## 2020-07-03 MED ORDER — PROSOURCE TF PO LIQD
45.0000 mL | Freq: Three times a day (TID) | ORAL | Status: DC
  Administered 2020-07-03 – 2020-07-11 (×22): 45 mL
  Filled 2020-07-03 (×22): qty 45

## 2020-07-03 MED ORDER — LIDOCAINE HCL 1 % IJ SOLN
INTRAMUSCULAR | Status: AC
  Filled 2020-07-03: qty 20

## 2020-07-03 NOTE — Progress Notes (Signed)
Pharmacy Antibiotic Note  Matan Steen is a 75 y.o. male admitted on 06/30/2020 with pneumonia.  Pharmacy  consulted for Vancomycin and meropenem dosing.  CrCl >100 ml/min.  SCr 0.61  Vancomycin Goal AUC 400-550.  Will adjust Vancomycin dose to 1500 mg IV q24hr,  (predicted AUC 446, used SCr 1; used Vd 0.5 L/kg d/t BMI >30)   Plan: Adjust Vancomycin to 1500 mg IV q24hr Continue Meropenem 2 g IV q8 hr.  Monitor renal function, clinical status, cultures and vanc levels as needed.  Height: 6' (182.9 cm) Weight: 106 kg (233 lb 11 oz) IBW/kg (Calculated) : 77.6  Temp (24hrs), Avg:98.1 F (36.7 C), Min:97.7 F (36.5 C), Max:99.1 F (37.3 C)  Recent Labs  Lab 06/30/20 1006 07/01/20 0617 07/02/20 0104  WBC 9.7 13.1* 13.0*  CREATININE 0.42* 0.52* 0.61  LATICACIDVEN 1.3  --   --     Estimated Creatinine Clearance: 102 mL/min (by C-G formula based on SCr of 0.61 mg/dL).    Allergies  Allergen Reactions  . Iodine Swelling  . Penicillins Swelling    ** tolerates cephalosporins Facial swelling, itchy throat    Antimicrobials this admission: Vanc 1/8 >>  Meropenem 1/7>>  Thank you for allowing pharmacy to be a part of this patient's care.  Noah Delaine, RPh Clinical Pharmacist Please see AMION for all Pharmacists' Contact Phone Numbers 07/03/2020, 9:12 AM

## 2020-07-03 NOTE — Plan of Care (Signed)

## 2020-07-03 NOTE — Progress Notes (Signed)
SLP Cancellation Note  Patient Details Name: Shannon [name] MRN: [id number] DOB: 07-16-1945   Cancelled MBS: Spoke with MD regarding MBS today. Pt is anticipated to continue to have highly variable swallow function/safety, and PEG is favored at this time. MD indicates plan to place Cortrak to support pt nutritionally temporarily, and proceed with PEG placement this week. That way pt will have long term nutritional support regardless of status of swallow function. Once pt has had nutrition and increases strength and endurance, MBS may be revisited.  Kelvon Giannini B. Murvin Natal, [facility], CCC-SLP Speech Language Pathologist Office: [phone] Pager: [phone]  Leigh Aurora 07/03/2020, 9:18 AM

## 2020-07-03 NOTE — Consult Note (Signed)
Chief Complaint: Patient was seen in consultation today for dysphagia/percutaneous gastrostomy tube placement.  Referring Physician(s): Lorin Glass [facility])  Supervising Physician: Richarda Overlie  Patient Status: [facility] - In-pt  History of Present Illness: Shannon [name] is a 75 y.o. 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 He presented to [facility] ED from [facility] 06/30/2020 for management of cough and recurrent hematuria. In ED, CXR concerning for pneumonia and UA concerning for UTI. He was admitted for further management of aspiration pneumonia and complicated UTI. Hospital course complicated by SIRS (secondary to complicated UTI/aspiration pneumonia), acute respiratory failure (secondary to aspiration pneumonia), chronic sacral decubitus ulcer stage IV, and severe protein calorie malnutrition. Palliative care has been consulted, at this time per patient's daughter patient is to remain full code.  IR consulted by Dr. Pola Corn for possible image-guided percutaneous gastrostomy tube placement. Patient laying in bed resting comfortably. He responds to voice and answers simple questions appropriately. No complaints. Denies fever, chills, chest pain, dyspnea, abdominal pain, or headache.   Past Medical History:  Diagnosis Date  . Asthma   . 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 Newell Coral, 1992-->surg at [facility].  Summer 2021->Cervicalgia,arm pain, hand atrophy RUE wkness, hyperreflex (Dr. 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 Hay fever   . Hypertension   . Osteoarthritis of both hands     Past Surgical History:  Procedure Laterality Date  . ANTERIOR CERVICAL DECOMP/DISCECTOMY FUSION N/A 04/05/2020   Procedure: ANTERIOR CERVICAL DECOMPRESSION/DISCECTOMY FUSION, INTERBODY PROSTHESIS, PLATE/SCREWS CERVICAL THREE-CERVICAL FOUR, CERVICAL FOUR- CERVICAL FIVE;  Surgeon: Tressie Stalker, MD;  Location: [facility] OR;  Service: Neurosurgery;  Laterality: N/A;  . ANTERIOR CERVICAL DECOMP/DISCECTOMY FUSION N/A 04/22/2020   Procedure: Reexploration of anterior cervical wound for epidural hematoma;  Surgeon: Donalee Citrin, MD;  Location: [facility] OR;  Service: Neurosurgery;  Laterality: N/A;  . BACK SURGERY    . INCISION AND DRAINAGE Left 2012   L hand infection  . ROTATOR CUFF REPAIR Right    x 3  . SPINE SURGERY      Allergies: Iodine and Penicillins  Medications: Prior to Admission medications   Medication Sig Start Date End Date Taking? Authorizing Provider  acetaminophen (TYLENOL) 500 MG tablet Take 1,000 mg by mouth 3 (three) times daily.    Yes [provider]  albuterol (VENTOLIN HFA) 108 (90 Base) MCG/ACT inhaler Inhale 2 puffs into the lungs daily.   Yes [provider]  ascorbic acid (VITAMIN C) 500 MG tablet Take 1,000 mg by mouth daily.   Yes [provider]  aspirin 325 MG tablet Take 325 mg by mouth every morning.   Yes [provider]  docusate sodium (COLACE) 100 MG capsule Take 1 capsule (100 mg total) by mouth 2 (two) times daily. 06/12/20  Yes Leroy Sea, MD  escitalopram (LEXAPRO) 5 MG tablet Take 2.5 mg by mouth at bedtime.   Yes [provider]  ferrous sulfate 325 (65 FE) MG tablet Take 1 tablet (325 mg total) by mouth 2 (two) times daily with a meal. 06/06/20  Yes Zannie Cove, MD  gabapentin (NEURONTIN)  100 MG capsule Take 200 mg by mouth 2 (two) times daily.   Yes [provider]  midodrine (PROAMATINE) 10  MG tablet Take 1 tablet (10 mg total) by mouth 2 (two) times daily with a meal. 06/06/20  Yes Zannie Cove, MD  oxyCODONE (OXY IR/ROXICODONE) 5 MG immediate release tablet Take 2.5 mg by mouth as needed for severe pain.   Yes [provider]  pantoprazole (PROTONIX) 40 MG tablet Take 1 tablet (40 mg total) by mouth at bedtime. 04/19/20  Yes Love, [name], PA-C  polyethylene glycol (MIRALAX / GLYCOLAX) 17 g packet Take 17 g by mouth every morning. Mix in 6 oz water and drink   Yes [provider]  scopolamine (TRANSDERM-SCOP) 1 MG/3DAYS Place 1 patch onto the skin every 3 (three) days.   Yes [provider]  tamsulosin (FLOMAX) 0.4 MG CAPS capsule Take 1 capsule (0.4 mg total) by mouth daily. 04/28/20  Yes Tressie Stalker, MD  feeding supplement (ENSURE ENLIVE / ENSURE PLUS) LIQD Take 237 mLs by mouth 4 (four) times daily. 06/12/20   Leroy Sea, MD     Family History  Problem Relation Age of Onset  . Heart attack Mother   . Heart disease Mother   . High blood pressure Mother   . Alcohol abuse Father   . Cancer Father   . Diabetes Father   . High blood pressure Father     Social History   Socioeconomic History  . Marital status: Divorced    Spouse name: Not on file  . Number of children: Not on file  . Years of education: Not on file  . Highest education level: Not on file  Occupational History  . Not on file  Tobacco Use  . Smoking status: Former Games developer  . Smokeless tobacco: Never Used  Vaping Use  . Vaping Use: Never used  Substance and Sexual Activity  . Alcohol use: Not Currently  . Drug use: Never  . Sexual activity: Not Currently  Other Topics Concern  . Not on file  Social History Narrative   Divorced, one daughter.   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   Veteran: Electronics engineer.  [facility] to [facility].   Tob: former.   Alc: no   Social Determinants of Corporate investment banker Strain: Not on file  Food Insecurity: Not  on file  Transportation Needs: Not on file  Physical Activity: Not on file  Stress: Not on file  Social Connections: Not on file     Review of Systems: A 12 point ROS discussed and pertinent positives are indicated in the HPI above.  All other systems are negative.  Review of Systems  Constitutional: Negative for chills and fever.  Respiratory: Negative for shortness of breath and wheezing.   Cardiovascular: Negative for chest pain and palpitations.  Gastrointestinal: Negative for abdominal pain.  Neurological: Negative for headaches.    Vital Signs: BP 126/66 (BP Location: Left Arm)   Pulse (!) 108   Temp 98.4 F (36.9 C) (Oral)   Resp (!) 24   Ht 6' (1.829 m)   Wt 233 lb 11 oz (106 kg)   SpO2 97%   BMI 31.69 kg/m   Physical Exam Constitutional:      General: He is not in acute distress.    Comments: Frail appearing elderly male.  Neck:     Comments: (+) c-collar. Cardiovascular:     Rate and Rhythm: Normal rate  and regular rhythm.     Heart sounds: Normal heart sounds. No murmur heard.   Pulmonary:     Effort: Pulmonary effort is normal. No respiratory distress.     Breath sounds: Normal breath sounds. No wheezing.  Skin:    General: Skin is warm and dry.  Neurological:     Mental Status: He is alert.     Comments: Quadriplegic.      MD Evaluation Airway: WNL Heart: WNL Abdomen: WNL Chest/ Lungs: WNL ASA  Classification: 3 Mallampati/Airway Score: Two   Imaging: CT ABDOMEN PELVIS WO CONTRAST  Result Date: 06/19/2020 CLINICAL DATA:  Abdominal pain, hematuria since Thursday. Suprapubic catheter in place. RIGHT lower quadrant abdominal pain. COVID positive. EXAM: CT ABDOMEN AND PELVIS WITHOUT CONTRAST TECHNIQUE: Multidetector CT imaging of the abdomen and pelvis was performed following the standard protocol without IV contrast. COMPARISON:  Chest CT dated 05/03/2020. FINDINGS: Lower chest: Patchy small consolidations at the bilateral lung bases, RIGHT  greater than LEFT. RIGHT pleural effusion, small to moderate in size. Hepatobiliary: No focal liver abnormality is seen. 1.1 cm gallstone. Gallbladder appears otherwise unremarkable. No bile duct dilatation is seen. Pancreas: Diffusely infiltrated with fat but otherwise unremarkable. No peripancreatic fluid. Spleen: Normal in size without focal abnormality. Adrenals/Urinary Tract: Adrenal glands are unremarkable. 2 LEFT renal stones, each measuring 2-3 mm, nonobstructive. Single 3 mm nonobstructing RIGHT renal stone. No hydronephrosis is seen bilaterally. No ureteral or bladder calculi are identified. Bladder is decompressed by Foley catheter. Contents of the bladder are hyperdense, presumably blood products. Stomach/Bowel: No dilated large or small bowel loops. Diverticulosis of the sigmoid and descending colon but no focal inflammatory changes seen to suggest acute diverticulitis. Moderate amount of stool and gas in the ascending and transverse colon. Stomach is unremarkable, partially decompressed. Vascular/Lymphatic: Aortic atherosclerosis. No enlarged lymph nodes are seen within the abdomen or pelvis. Reproductive: Prostate is unremarkable. Other: No free fluid or abscess collection is seen. No free intraperitoneal air. Musculoskeletal: No acute or suspicious osseous finding. Degenerative spondylosis at the L1-2 and L2-3 levels, moderate in degree with associated disc space narrowing and osseous spurring. Visualized soft tissues of the abdominal wall are unremarkable. Gas-like collection overlying the coccyx and lower sacrum. IMPRESSION: 1. Patchy small consolidations at the bilateral lung bases, RIGHT greater than LEFT, suggesting COVID-19 pneumonia. RIGHT pleural effusion, small to moderate in size. 2. Bladder is decompressed by Foley catheter. Contents of the bladder are hyperdense, presumably blood products. 3. Bilateral nephrolithiasis (largest stone measuring 3 mm). No ureteral or bladder calculi. No  hydronephrosis. 4. Colonic diverticulosis without evidence of acute diverticulitis. 5. Cholelithiasis without evidence of acute cholecystitis. 6. Ill-defined collection of air overlying the coccyx and lower sacrum, possibly superficial to the patient but concerning for sequela of sacral decubitus ulcer or other infectious process of the soft tissues overlying the sacrum/coccyx. Clinical correlation recommended. Aortic Atherosclerosis (ICD10-I70.0). Electronically Signed   By: Bary RichardStan  Maynard M.D.   On: 06/19/2020 15:49   DG Chest Portable 1 View  Result Date: 06/30/2020 CLINICAL DATA:  COVID-19 positive patient with productive cough. EXAM: PORTABLE CHEST 1 VIEW COMPARISON:  Single-view of the chest 06/19/2020 and 06/06/2020. FINDINGS: Right pleural effusion and basilar airspace disease have markedly worsened. The patient has a very small left pleural effusion and mild basilar airspace disease. No pneumothorax. Heart size normal. IMPRESSION: Marked worsening of a right pleural effusion which appears moderate in size and right basilar airspace disease. Trace left pleural effusion is noted. Electronically Signed  By: Drusilla Kanner M.D.   On: 06/30/2020 10:00   DG Chest Port 1 View  Result Date: 06/19/2020 CLINICAL DATA:  cough EXAM: PORTABLE CHEST 1 VIEW COMPARISON:  06/06/2020 and prior. FINDINGS: Decreased bibasilar opacities and small pleural effusions. No pneumothorax. Cardiomediastinal silhouette within normal limits. Left shoulder osteoarthrosis. IMPRESSION: Decreased bibasilar opacities and small pleural effusions. Electronically Signed   By: Stana Bunting M.D.   On: 06/19/2020 14:40   DG CHEST PORT 1 VIEW  Result Date: 06/06/2020 CLINICAL DATA:  Hypoxia EXAM: PORTABLE CHEST 1 VIEW COMPARISON:  05/31/2020 and prior FINDINGS: No pneumothorax. Patchy bibasilar opacities and small effusions, unchanged. Stable cardiomediastinal silhouette and osseous structures. IMPRESSION: Bibasilar opacities  and small effusions, unchanged. Electronically Signed   By: Stana Bunting M.D.   On: 06/06/2020 19:17   DG Chest Port 1V same Day  Result Date: 07/01/2020 CLINICAL DATA:  Shortness of breath.  COVID positive. EXAM: PORTABLE CHEST 1 VIEW COMPARISON:  Chest x-ray dated 06/30/2020. FINDINGS: Heart size and mediastinal contours are stable. Small pleural effusion and/or atelectasis at the LEFT lung base. Stable opacity at the RIGHT lung base, moderate-sized pleural effusion and/or atelectasis or pneumonia. No pneumothorax is seen. IMPRESSION: 1. Dense opacity at the RIGHT lung base, stable compared to most recent chest x-ray of 06/30/2020, compatible with moderate-sized pleural effusion with underlying atelectasis or pneumonia. 2. Small pleural effusion and/or atelectasis at the LEFT lung base. Electronically Signed   By: Bary Richard M.D.   On: 07/01/2020 07:54   DG Swallowing Func-Speech Pathology  Result Date: 06/05/2020 Objective Swallowing Evaluation: Type of Study: MBS-Modified Barium Swallow Study  Patient Details Name: Shannon [name] MRN: [id number] Date of Birth: May 14, 1946 Today's Date: 06/05/2020 Time: SLP Start Time (ACUTE ONLY): 1300 -SLP Stop Time (ACUTE ONLY): 1316 SLP Time Calculation (min) (ACUTE ONLY): 16 min Past Medical History: Past Medical History: Diagnosis Date . Asthma  . 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 Newell Coral, 1992-->surg at [facility].  Summer 2021->Cervicalgia,arm pain, hand atrophy RUE wkness, hyperreflex (Dr. 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 Hay fever  . Hypertension  . Osteoarthritis of both hands  Past Surgical History: Past Surgical History: Procedure Laterality Date . ANTERIOR CERVICAL DECOMP/DISCECTOMY FUSION N/A 04/05/2020  Procedure: ANTERIOR CERVICAL DECOMPRESSION/DISCECTOMY FUSION, INTERBODY PROSTHESIS,  PLATE/SCREWS CERVICAL THREE-CERVICAL FOUR, CERVICAL FOUR- CERVICAL FIVE;  Surgeon: Tressie Stalker, MD;  Location: [facility] OR;  Service: Neurosurgery;  Laterality: N/A; . ANTERIOR CERVICAL DECOMP/DISCECTOMY FUSION N/A 04/22/2020  Procedure: Reexploration of anterior cervical wound for epidural hematoma;  Surgeon: Donalee Citrin, MD;  Location: [facility] OR;  Service: Neurosurgery;  Laterality: N/A; . BACK SURGERY   . INCISION AND DRAINAGE Left 2012  L hand infection . ROTATOR CUFF REPAIR Right   x 3 . SPINE SURGERY   HPI: Shannon Schneck Smithis a 75 y.o.malewith medical history significant ofquadriplegia which follows surgery for Chiari I malformation with syringomyelia surgery in October 2021.  He originally had a C3-4 [place] 4-5 anterior cervical disc ACDF with decompression, C3 3-4 and C4-5 interbody arthrodesis with local autograft bone, anterior cervical plating of C3-C5 on 04/05/2020.  He then went home and had a fall from his bed, he was then hospitalized and from here he was sent to a nursing home, he has been quadriplegic since then, he has also developed sacral decubitus ulcers. Was seen for MBS on 05/04/20 "deficits resulted in  silent aspiration of thin liquid and nectar thick liquid by straw prior to the swallow. With nectar thick liquid by cup, only transient penetration was seen. There was significant pharyngeal residue with puree and solid consistencies which was reduced but not fully cleared with liquid wash." Now admitted for AMS due to UTI, sepsis and hypotension in a patient with indwelling Foley catheter POA and sacral decubitus ulcers POA who is quadriplegic. Pt/niece preferred to remain on nectar thick full liquids- pt stable and ST signed off. Re-ordered by hospitalist "suspicion for aspiration pna".   Subjective: alert, cooperative, pleasant, participative. Assessment / Plan / Recommendation CHL IP CLINICAL IMPRESSIONS 06/05/2020 Clinical Impression Pt demonstrates normal swallow ability. No penetration,  aspiration or residue was observed. Pt at times had mild spillage of oral residue to pharynx which he sensed and swallowed, not outside of normal limits. He is able to masticate and esophageal sweep with solids appeared WNL. Will initiate a regular diet and thin liquids to allow foods of choice though pt will need total assist feeding. Will f/u for tolerance. SLP Visit Diagnosis Dysphagia, unspecified (R13.10) Attention and concentration deficit following -- Frontal lobe and executive function deficit following -- Impact on safety and function Mild aspiration risk;Risk for inadequate nutrition/hydration   CHL IP TREATMENT RECOMMENDATION 06/05/2020 Treatment Recommendations Therapy as outlined in treatment plan below   Prognosis 06/05/2020 Prognosis for Safe Diet Advancement Good Barriers to Reach Goals -- Barriers/Prognosis Comment -- CHL IP DIET RECOMMENDATION 06/05/2020 SLP Diet Recommendations Regular solids;Thin liquid Liquid Administration via Cup;Straw Medication Administration Whole meds with liquid Compensations Slow rate;Small sips/bites;Follow solids with liquid Postural Changes --   CHL IP OTHER RECOMMENDATIONS 06/05/2020 Recommended Consults -- Oral Care Recommendations Oral care BID Other Recommendations --   CHL IP FOLLOW UP RECOMMENDATIONS 06/05/2020 Follow up Recommendations [facility] D[facility]/05/2020 Speech Therapy Frequency (ACUTE ONLY) min 2x/week Treatment Duration 2 weeks      CHL IP ORAL PHASE 06/05/2020 Oral Phase WFL Oral - Pudding Teaspoon -- Oral - Pudding Cup -- Oral - Honey Teaspoon -- Oral - Honey Cup -- Oral - Nectar Teaspoon -- Oral - Nectar Cup -- Oral - Nectar Straw -- Oral - Thin Teaspoon -- Oral - Thin Cup -- Oral - Thin Straw -- Oral - Puree -- Oral - Mech Soft -- Oral - Regular -- Oral - Multi-Consistency -- Oral - Pill -- Oral Phase - Comment --  CHL IP PHARYNGEAL PHASE 06/05/2020 Pharyngeal Phase WFL Pharyngeal- Pudding Teaspoon --  Pharyngeal -- Pharyngeal- Pudding Cup -- Pharyngeal -- Pharyngeal- Honey Teaspoon -- Pharyngeal -- Pharyngeal- Honey Cup -- Pharyngeal -- Pharyngeal- Nectar Teaspoon -- Pharyngeal -- Pharyngeal- Nectar Cup -- Pharyngeal -- Pharyngeal- Nectar Straw -- Pharyngeal -- Pharyngeal- Thin Teaspoon -- Pharyngeal -- Pharyngeal- Thin Cup -- Pharyngeal -- Pharyngeal- Thin Straw -- Pharyngeal -- Pharyngeal- Puree -- Pharyngeal -- Pharyngeal- Mechanical Soft -- Pharyngeal -- Pharyngeal- Regular -- Pharyngeal -- Pharyngeal- Multi-consistency -- Pharyngeal -- Pharyngeal- Pill -- Pharyngeal -- Pharyngeal Comment --  CHL IP CERVICAL ESOPHAGEAL PHASE 06/05/2020 Cervical Esophageal Phase -- Pudding Teaspoon -- Pudding Cup -- Honey Teaspoon -- Honey Cup -- Nectar Teaspoon -- Nectar Cup -- Nectar Straw -- Thin Teaspoon -- Thin Cup WFL Thin Straw WFL Puree WFL Mechanical Soft WFL Regular -- Multi-consistency -- Pill WFL Cervical Esophageal Comment -- Harlon DittyBonnie DeBlois, MA CCC-SLP Acute Rehabilitation Services Pager [phone] Office [phone] Claudine MoutonDeBlois, Bonnie Caroline 06/05/2020, 1:53 PM  Labs:  CBC: Recent Labs    06/30/20 1006 07/01/20 0617 07/02/20 0104 07/03/20 0849  WBC 9.7 13.1* 13.0* 11.9*  HGB 9.9* 9.8* 9.1* 8.8*  HCT 33.0* 32.3* 29.3* 29.3*  PLT 258 235 206 216    COAGS: Recent Labs    05/03/20 1243 05/21/20 1533 05/22/20 0507 06/19/20 1312 06/20/20 0348 06/30/20 1005  INR 1.7* 1.5* 1.5* 1.3* 1.4* 1.2  APTT 32 31  --   --   --  35    BMP: Recent Labs    01/09/20 1317 04/04/20 1037 06/30/20 1006 07/01/20 0617 07/02/20 0104 07/03/20 0849  NA 140   < > 140 141 142 146*  K 3.8   < > 3.5 3.3* 3.4* 3.2*  CL 106   < > 104 100 105 110  CO2 25   < > 28 27 24 25   GLUCOSE 100*   < > 101* 106* 83 79  BUN 12   < > 8 9 9  6*  CALCIUM 8.6*   < > 8.1* 7.9* 7.8* 8.1*  CREATININE 1.10   < > 0.42* 0.52* 0.61 0.65  GFRNONAA >60   < > >60 >60 >60 >60  GFRAA >60  --   --   --   --   --     < > = values in this interval not displayed.    LIVER FUNCTION TESTS: Recent Labs    06/25/20 0304 06/30/20 1006 07/02/20 0104 07/03/20 0849  BILITOT 0.4 0.9 1.4* 1.1  AST 19 20 15 16   ALT 12 10 9 8   ALKPHOS 81 98 84 83  PROT 5.1* 6.2* 5.5* 5.6*  ALBUMIN 1.6* 1.7* 1.3* 1.3*     Assessment and Plan:  History of chronic dysphagia and chronic aspiration in setting of severe protein calorie malnutrition. Plan for image-guided percutaneous gastrostomy tube placement in IR tentatively for tomorrow 07/04/2020 pending IR scheduling. Thin barium via NGT the night prior per IR MD. Patient will be NPO at midnight. Afebrile. He does not take blood thinners. INR 1.2 06/30/2020.  Risks and benefits discussed with the patient including, but not limited to the need for a barium enema during the procedure, bleeding, infection, peritonitis, or damage to adjacent structures. All of the patient's daughter's questions were answered, she is agreeable to proceed. Consent obtained by patient's daughter, Shannon [name], via telephone- signed and in IR control room.   Thank you for this interesting consult.  I greatly enjoyed meeting Shannon [name] and look forward to participating in their care.  A copy of this report was sent to the requesting provider on this date.  Electronically Signed: 09/01/2020, PA-C 07/03/2020, 11:15 AM   I spent a total of 40 Minutes in face to face in clinical consultation, greater than 50% of which was counseling/coordinating care for dysphagia/percutaneous gastrostomy tube placement.

## 2020-07-03 NOTE — Consult Note (Signed)
Subjective: Patient known to our service as we saw him just recently on his last admission for his stage IV sacral decubitus ulcer.  Conservative management was done at that time with hydrotherapy and dressing changes.  He was discharged to his facility but readmitted secondary to aspiration PNA and hematuria in an indwelling foley catheter.  We have been asked to see him again for further evaluation of his wound.  He states that the SNF was not taking care of his wound, but I'm not sure whether that is truly the case or not.    ROS: See above, otherwise he is quadriplegic, dysphagia, but denies any other complaints.  Objective: Vital signs in last 24 hours: Temp:  [97.7 F (36.5 C)-99.1 F (37.3 C)] 98.4 F (36.9 C) (01/10 0753) Pulse Rate:  [79-111] 108 (01/10 0753) Resp:  [13-24] 24 (01/10 0753) BP: (101-126)/(61-71) 126/66 (01/10 0753) SpO2:  [97 %-99 %] 97 % (01/10 0753)    Intake/Output from previous day: 01/09 0701 - 01/10 0700 In: 3852.7 [I.V.:3067.7; IV Piggyback:785] Out: 1000 [Urine:1000] Intake/Output this shift: No intake/output data recorded.  PE: Gen: frail, elderly appearing male HEENT: PERRL Neck: c-collar in place, trachea midline Heart: regular Lungs: some scattered rhonchi noted, mostly on right Abd: soft, NT, ND Skin: stage IV sacral wound noted.  Base of the wound is overall clean.  There is necrotic eschar noted at the superior aspect of the wound with some undermining with necrotic tissue.  This was debrided at the bedside.  See before and after pictures below.  (they are facing different directions.  Top photo cephalad is to the left, bottom picture cephalad is to the right) Neuro: quadriplegic Psych: alert and seems oriented        Lab Results:  Recent Labs    07/02/20 0104 07/03/20 0849  WBC 13.0* 11.9*  HGB 9.1* 8.8*  HCT 29.3* 29.3*  PLT 206 216   BMET Recent Labs    07/02/20 0104 07/03/20 0849  NA 142 146*  K 3.4* 3.2*   CL 105 110  CO2 24 25  GLUCOSE 83 79  BUN 9 6*  CREATININE 0.61 0.65  CALCIUM 7.8* 8.1*   PT/INR No results for input(s): LABPROT, INR in the last 72 hours. CMP     Component Value Date/Time   NA 146 (H) 07/03/2020 0849   K 3.2 (L) 07/03/2020 0849   CL 110 07/03/2020 0849   CO2 25 07/03/2020 0849   GLUCOSE 79 07/03/2020 0849   BUN 6 (L) 07/03/2020 0849   CREATININE 0.65 07/03/2020 0849   CALCIUM 8.1 (L) 07/03/2020 0849   PROT 5.6 (L) 07/03/2020 0849   ALBUMIN 1.3 (L) 07/03/2020 0849   AST 16 07/03/2020 0849   ALT 8 07/03/2020 0849   ALKPHOS 83 07/03/2020 0849   BILITOT 1.1 07/03/2020 0849   GFRNONAA >60 07/03/2020 0849   GFRAA >60 01/09/2020 1317   Lipase     Component Value Date/Time   LIPASE 13 06/30/2020 1006       Studies/Results: No results found.  Anti-infectives: Anti-infectives (From admission, onward)   Start     Dose/Rate Route Frequency Ordered Stop   07/04/20 1400  vancomycin (VANCOREADY) IVPB 1500 mg/300 mL        1,500 mg 150 mL/hr over 120 Minutes Intravenous Every 24 hours 07/03/20 0904     07/01/20 0900  vancomycin (VANCOREADY) IVPB 1500 mg/300 mL  Status:  Discontinued  1,500 mg 150 mL/hr over 120 Minutes Intravenous Every 12 hours 07/01/20 0843 07/03/20 0904   06/30/20 2000  meropenem (MERREM) 2 g in sodium chloride 0.9 % 100 mL IVPB        2 g 200 mL/hr over 30 Minutes Intravenous Every 8 hours 06/30/20 1303     06/30/20 1115  meropenem (MERREM) 2 g in sodium chloride 0.9 % 100 mL IVPB        2 g 200 mL/hr over 30 Minutes Intravenous  Once 06/30/20 1036 06/30/20 1241       Assessment/Plan Multiple medical problems  Stage IV sacral decubitus ulcer There was necrotic eschar noted over the superior aspect of the wound along with some necrotic undermining mostly of the superior  Wound around 9 oclock to 3 oclock.  Will ask PT hydrotherapy to work on the remaining aspect of the undermining tissue along with some santyl and BID WD  dressing changes.  No further surgical debridement is needed at this time.  We will re-evaluate the wound later this week to follow up on progress after therapies have been initiated.  The wound is not infected and does not need any abx therapy at this time.  FEN - NPO VTE - per medicine ID - Merrem/Vanc (for aspiration PNA)   LOS: 3 days    Letha Cape , [facility] 07/03/2020, 11:00 AM Please see Amion for pager number during day hours 7:00am-4:30pm or 7:00am -11:30am on weekends

## 2020-07-03 NOTE — Progress Notes (Signed)
Barrium was administered by this nurse via cortrak. Pt tolerated well and notified on coming nurse to stop feeds at midnight

## 2020-07-03 NOTE — Progress Notes (Signed)
PROGRESS NOTE  Shannon [name]  DOB: 1946/02/04  PCP: [name], MD LPF:[id number]  DOA: 06/30/2020  LOS: 3 days   Chief Complaint  Patient presents with  . Hematuria    Brief narrative: Shannon [name] is a 75 y.o. 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  Then he had a fall at home developed epidural hematoma, underwent reexploration, removal of hardware, evacuation of hematoma on 10/30, now quadriplegic with dysphagia, unstageable sacral decubitus wounds, failure to thrive-with numerous recent hospitalizations for aspiration pneumonia/hematuria/UTI.   Patient was brought to the ED from SNF for evaluation of recurrent hematuria, cough.  In the ED, he briefly required up to 10 L of oxygen. Chest x-ray showed dense opacity at the right lung base. Urinalysis showed turbid brown urine. Patient was admitted for acute hypoxic respiratory due to aspiration pneumonia and complicated UTI. Urine culture sent on admission grew multidrug-resistant Achromobacter xylosoxidans Of note, patient was diagnosed with COVID-19 during his prior hospitalization on 12/14.  He received monoclonal antibody.  He continues to have a positive PCR but does not have Covid infection.  Subjective: Patient was seen and examined this morning. Lying on bed.  No new symptoms. Discussed with wound care nurse and speech therapist.  Patient has significant stage IV sacral decubitus ulcer probably require surgical intervention.  Patient has significant dysphagia probably benefit from PEG tube. Discussed with patient's daughter on the phone.  Assessment/Plan: SIRS due to complicated UTI and aspiration pneumonia:  -Patient has chronic dysphagia, chronic aspiration tendency.  Chest x-ray showed opacity in the right lung base.   -Patient was afebrile but developed mild leukocytosis. -Started on  broad-spectrum antibiotic coverage with meropenem and vancomycin. -Follow-up culture reports. Recent Labs  Lab 06/30/20 1006 07/01/20 0617 07/02/20 0104  WBC 9.7 13.1* 13.0*  LATICACIDVEN 1.3  --   --   PROCALCITON <0.10  --   --    Acute hypoxic respiratory failure due to aspiration pneumonia:  -Briefly required up to 10 L of oxygen initially.  Later titrated down.   -Remains n.p.o. per speech therapy evaluation.   -Has had prior palliative care evaluations in the past-remains a full code.  Have reconsulted palliative care for goals of care-see below-re palliative care discussion  Right pleural effusion -Chest x-ray obtained on admission showed moderate-sized pleural effusion on the right. -Patient has low albumin level which probably means making it worse.  We will get ultrasound-guided thoracentesis.  UTI associated with chronic Foley catheter -Urinalysis on admission showed turbid brown urine.  Urine culture grew more than 1000 CFU per mL of multidrug-resistant Achromobacter xylosoxidans  Hematuria secondary to complicated UTI Foley trauma-due to indwelling Foley catheter:  -Hematuria resolved.  Urine is clear now.  Chronic dysphagia/chronic aspiration Severe protein calorie malnutrition -Speech therapy evaluation obtained.  He failed bedside evaluation.  He has poor nutritional status with albumin of 1.3.  I think would benefit from PEG tube placement.  Patient and family agrees to that.  I have consult placed. -For the time being, will provide Cortrak feeding.  Chronic sacral decubitus ulcer stage IV - POA -Wound examined with nurse today.  Repeated taken.  Discussed with wound care nurse.  Surgical consultation recommended.  History of COVID-19 infection:  -Positive Covid PCR on admission is a sequelae of prior infection-doubt he requires any further treatment or airborne isolation for this issue.  Continue to observe-his hypoxia is  due to aspiration pneumonia.     Quadriplegia with dysphagia-cervical myelopathy-s/p C-spine surgery on 10/13-subsequently sustained a fall at home and developed epidural hematoma-underwent reexploration/removal of hardware and evacuation of hematoma on 10/30.  Per prior documentation-plan is to leave cervical collar for 3 months total-till mid January.  Continues to have significant quadriplegia (able to move upper extremity side-by-side-unable to lift off the bed against gravity, able to only wiggle toes-unable to lift lower extremities off the bed against gravity)  Hypotension: BP remains stable on midodrine.  I do not see any other blood pressure medicines in the list  Multifactorial anemia-anemia of chronic disease/vitamin B12 deficiency/borderline iron deficiency/acute blood loss due to hematuria:  -Hemoglobin at baseline-continue iron supplementation Recent Labs    05/22/20 1304 05/23/20 0432 06/07/20 0032 06/07/20 0739 06/24/20 0306 06/25/20 0304 06/30/20 1006 07/01/20 0617 07/02/20 0104  HGB  --    < >  --    < > 8.6* 8.9* 9.9* 9.8* 9.1*  MCV  --    < >  --    < > 97.6 97.0 95.9 94.7 93.6  VITAMINB12 160*  --   --   --   --   --   --   --  456  FOLATE 5.9*  --   --   --   --   --   --   --   --   FERRITIN 89  --  907*  --   --   --   --   --   --   TIBC 150*  --   --   --   --   --   --   --   --   IRON 25*  --   --   --   --   --   --   --   --   RETICCTPCT 3.9*  --   --   --   --   --   --   --   --    < > = values in this interval not displayed.   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  Palliative care/goals of care: Has had numerous palliative care evaluations in the past-remains a full code-this MD reached out to patient's niece who asked me to call patient's daughter-she will assessment who I spoke to this morning.  Explained poor prognosis-potential for life-threatening pneumonia/respiratory  failure-however she remains resistant to palliative care-and states that she believes in God and that "God is the healer" and "you are  just a vessel"   Patient's overall prognosis remains poor-he is at significant risk for repeated hospitalizations due to recurrent aspirations pneumonia/UTI/sacral decubitus ulcer infection-which potentially could be life disabling or life-threatening.   Mobility: Quadriplegia at baseline Code Status:   Code Status: Full Code  Nutritional status: Body mass index is 31.69 kg/m.     Diet Order            Diet NPO time specified  Diet effective now                 DVT prophylaxis: SCDs Start: 06/30/20 1310   Antimicrobials:  IV meropenem, IV vancomycin.  I will continue the same for now developed 3 possible ongoing infections. Fluid: Normal saline 75 mill per hour Consultants: None Family Communication:  Discussed with daughter on the phone  Status is: Inpatient  Remains inpatient appropriate because: Ongoing sepsis work-up, needs  broad-spectrum antibiotics   Dispo: The patient is from: SNF               Anticipated d/c is to: SNF              Anticipated d/c date is: > 3 days              Patient currently is not medically stable to d/c.  Infusions:  . sodium chloride 75 mL/hr at 07/02/20 0452  . meropenem (MERREM) IV 2 g (07/03/20 0508)  . sodium chloride irrigation    . [START ON 07/04/2020] vancomycin      Scheduled Meds: . acetaminophen  1,000 mg Oral TID  . ascorbic acid  1,000 mg Oral Daily  . Chlorhexidine Gluconate Cloth  6 each Topical Daily  . escitalopram  2.5 mg Oral QHS  . feeding supplement  237 mL Oral QID  . ferrous sulfate  325 mg Oral BID WC  . gabapentin  200 mg Oral BID  . mouth rinse  15 mL Mouth Rinse BID  . midodrine  10 mg Oral BID WC  . pantoprazole  40 mg Oral QHS  . polyethylene glycol  17 g Oral q morning - 10a  . scopolamine  1 patch Transdermal Q72H  . sodium chloride flush  3 mL Intravenous Q12H  .  tamsulosin  0.4 mg Oral Daily    Antimicrobials: Anti-infectives (From admission, onward)   Start     Dose/Rate Route Frequency Ordered Stop   07/04/20 1400  vancomycin (VANCOREADY) IVPB 1500 mg/300 mL        1,500 mg 150 mL/hr over 120 Minutes Intravenous Every 24 hours 07/03/20 0904     07/01/20 0900  vancomycin (VANCOREADY) IVPB 1500 mg/300 mL  Status:  Discontinued        1,500 mg 150 mL/hr over 120 Minutes Intravenous Every 12 hours 07/01/20 0843 07/03/20 0904   06/30/20 2000  meropenem (MERREM) 2 g in sodium chloride 0.9 % 100 mL IVPB        2 g 200 mL/hr over 30 Minutes Intravenous Every 8 hours 06/30/20 1303     06/30/20 1115  meropenem (MERREM) 2 g in sodium chloride 0.9 % 100 mL IVPB        2 g 200 mL/hr over 30 Minutes Intravenous  Once 06/30/20 1036 06/30/20 1241      PRN meds: acetaminophen, albuterol, ondansetron **OR** ondansetron (ZOFRAN) IV, oxyCODONE   Objective: Vitals:   07/03/20 0313 07/03/20 0753  BP: 112/67 126/66  Pulse: 92 (!) 108  Resp: 18 (!) 24  Temp: 97.9 F (36.6 C) 98.4 F (36.9 C)  SpO2:  97%    Intake/Output Summary (Last 24 hours) at 07/03/2020 0935 Last data filed at 07/03/2020 0317 Gross per 24 hour  Intake 3852.7 ml  Output 1000 ml  Net 2852.7 ml   Filed Weights   06/30/20 1143  Weight: 106 kg   Weight change:  Body mass index is 31.69 kg/m.   Physical Exam: General exam: Elderly Caucasian male.  Thin build.  Chronically sick looking lying on bed.   Skin: No rashes, lesions or ulcers. HEENT: Has cervical collar in place Lungs: Clear to auscultation bilaterally CVS: Regular rate and rhythm, normal GI/Abd soft, nontender, nondistended, bowel sound present CNS: Alert, awake.  Quadriplegia at baseline Sacral wound: Stage IV sacral wound at the back Psychiatry: Mood appropriate Extremities: No pedal edema, no calf tenderness  Data Review: I have personally reviewed the laboratory data  and studies available.  Recent Labs   Lab 06/30/20 1006 07/01/20 0617 07/02/20 0104  WBC 9.7 13.1* 13.0*  NEUTROABS 7.4  --   --   HGB 9.9* 9.8* 9.1*  HCT 33.0* 32.3* 29.3*  MCV 95.9 94.7 93.6  PLT 258 235 206   Recent Labs  Lab 06/30/20 1006 07/01/20 0617 07/02/20 0104  NA 140 141 142  K 3.5 3.3* 3.4*  CL 104 100 105  CO2 28 27 24   GLUCOSE 101* 106* 83  BUN 8 9 9   CREATININE 0.42* 0.52* 0.61  CALCIUM 8.1* 7.9* 7.8*    F/u labs ordered  Signed, Lorin GlassBinaya Erbie Arment, MD [facility] 07/03/2020

## 2020-07-03 NOTE — Progress Notes (Signed)
Initial Nutrition Assessment  DOCUMENTATION CODES:   Severe malnutrition in context of chronic illness  INTERVENTION:   Plan PEG placement tomorrow, continue to titrate tube feeding once able to be used  Monitor magnesium, potassium, and phosphorus daily for at least 3 days, MD to replete as needed, as pt is at risk for refeeding syndrome.   Tube feeding:  -Osmolite 1.5 @ 20 ml/hr via Cortrak -Increase by 10 ml Q6 hours to goal rate of 60 ml/hr (1440 ml) -ProSource TF 45 ml BID -Juven BID  Provides: 2320 kcals, 134 grams protein, 1097 ml free water.   NUTRITION DIAGNOSIS:   Severe Malnutrition related to chronic illness (chronic wounds) as evidenced by energy intake < or equal to 75% for > or equal to 1 month,severe fat depletion,severe muscle depletion.  GOAL:   Patient will meet greater than or equal to 90% of their needs  MONITOR:   Skin,TF tolerance,Weight trends,I & O's,Labs,PO intake  REASON FOR ASSESSMENT:   Consult Enteral/tube feeding initiation and management  ASSESSMENT:   Patient with PMH significant for quadriplegia due to cervical myelopathy s/p C-spine surgery 04/05/2020, cervical neck fracture and epidural hematoma 2/2 fall requiring exploration with removal of hardware and evacuation of the hematoma on 04/23/2020, and COVID-19 on 06/06/20. Presents this admission with UTI.   Pt known to clinical nutrition service. Intake has been poor each admission (between 10-25%) since 11/11 and most likely after. Was sent to SNF last admission on regular diet. Suspect poor intake continued. Currently on regular diet without meal completion charted. Gastric Cortrak placed at bedside today. Plan for PEG placement tomorrow. Titrate TF to goal slowly as pt is at risk for refeeding.   Weight noted to be pulled from last admission. Will need to obtain new weight to assess for loss.   UOP: 1000 ml x 24 hrs   Medications: 1000 mg Vitamin C, miralax Labs: Na 146 (H) K 3.2  (L)   NUTRITION - FOCUSED PHYSICAL EXAM:  Flowsheet Row Most Recent Value  Orbital Region Moderate depletion  Upper Arm Region Severe depletion  Thoracic and Lumbar Region Unable to assess  Buccal Region Severe depletion  Temple Region Severe depletion  Clavicle Bone Region Severe depletion  Clavicle and Acromion Bone Region Severe depletion  Scapular Bone Region Unable to assess  Dorsal Hand Moderate depletion  Patellar Region Unable to assess  Anterior Thigh Region Unable to assess  Posterior Calf Region Unable to assess  Edema (RD Assessment) Unable to assess  Hair Reviewed  Eyes Unable to assess  Mouth Reviewed  Skin Reviewed  Nails Reviewed     Diet Order:   Diet Order            Diet NPO time specified  Diet effective midnight           Diet NPO time specified  Diet effective now                 EDUCATION NEEDS:   Not appropriate for education at this time  Skin:  Skin Assessment: Skin Integrity Issues: Skin Integrity Issues:: DTI,Stage II,Stage IV,Stage III,Unstageable DTI: L throat Stage II: vertebral column Stage III: R throat Stage IV: sacrum Unstageable: sacrum  Last BM:  PTA  Height:   Ht Readings from Last 1 Encounters:  06/30/20 6' (1.829 m)    Weight:   Wt Readings from Last 1 Encounters:  06/30/20 106 kg    BMI:  Body mass index is 31.69 kg/m.  Estimated Nutritional  Needs:   Kcal:  2300-2500 kcal  Protein:  120-135 grams  Fluid:  >/= 2 L/day  Vanessa Kick RD, LDN Clinical Nutrition Pager listed in AMION

## 2020-07-03 NOTE — Procedures (Signed)
Cortrak  Person Inserting Tube:  Renie Ora, RD Tube Type:  Cortrak - 43 inches Tube Location:  Right nare Initial Placement:  Stomach Secured by: Bridle Technique Used to Measure Tube Placement:  Documented cm marking at nare/ corner of mouth Cortrak Secured At:  66 cm Procedure Comments:  Cortrak Tube Team Note:  Consult received to place a Cortrak feeding tube.   No x-ray is required. RN may begin using tube.   If the tube becomes dislodged please keep the tube and contact the Cortrak team at [email or web address] ([email or web address]) for replacement.  If after hours and replacement cannot be delayed, place a NG tube and confirm placement with an abdominal x-ray.      Trenton Gammon, MS, RD, LDN, CNSC Inpatient Clinical Dietitian RD pager # available in AMION  After hours/weekend pager # available in [facility]

## 2020-07-03 NOTE — Consult Note (Signed)
WOC Nurse Consult Note: Reason for Consult: sacral pressure injury Wound type: Unstageable pressure injury Pressure Injury POA: Yes Measurement: see nursing flow sheets and CCS notes Wound bed: see pre and post bedside surgical debridement  Drainage (amount, consistency, odor) see nursing flowsheets Periwound:intact  Dressing procedure/placement/frequency: CCS follow for wound care and bedside debridement; PT hydrotherapy ordered.   Will sign off CCS following patient Emilyann Banka [facility], CNS, CWON-AP [phone]

## 2020-07-03 NOTE — Progress Notes (Signed)
Patient brought to IR for therapeutic thoracentesis - limited US of the right chest shows minimal pleural fluid which is not amenable to safe percutaneous drainage at this time. Of note, the exam is limited due to patient's inability to tolerate laying flat and laying on his side for more than 1-2 minutes at a time.   [place] images from today as well as CXR from 07/01/20 reviewed by IR attending, Dr. Lowella Dandy, today who agrees there is not enough fluid for safe thoracentesis however there is concern for possible hydropneumothorax based on review of previous CXR. Recommend primary team repeat CXR and if these concerns are still present follow up with a CT chest for further evaluation. This has been relayed to hospitalist via secure chat today.  Please call IR with questions or concerns.  Lynnette Caffey, PA-C

## 2020-07-04 ENCOUNTER — Inpatient Hospital Stay (HOSPITAL_COMMUNITY): Payer: Medicare Other

## 2020-07-04 DIAGNOSIS — J9601 Acute respiratory failure with hypoxia: Secondary | ICD-10-CM | POA: Diagnosis not present

## 2020-07-04 DIAGNOSIS — J690 Pneumonitis due to inhalation of food and vomit: Secondary | ICD-10-CM | POA: Diagnosis not present

## 2020-07-04 LAB — CBC WITH DIFFERENTIAL/PLATELET
Abs Immature Granulocytes: 0.16 10*3/uL — ABNORMAL HIGH (ref 0.00–0.07)
Basophils Absolute: 0 10*3/uL (ref 0.0–0.1)
Basophils Relative: 0 %
Eosinophils Absolute: 0 10*3/uL (ref 0.0–0.5)
Eosinophils Relative: 0 %
HCT: 29.7 % — ABNORMAL LOW (ref 39.0–52.0)
Hemoglobin: 9.1 g/dL — ABNORMAL LOW (ref 13.0–17.0)
Immature Granulocytes: 2 %
Lymphocytes Relative: 14 %
Lymphs Abs: 1.3 10*3/uL (ref 0.7–4.0)
MCH: 28.9 pg (ref 26.0–34.0)
MCHC: 30.6 g/dL (ref 30.0–36.0)
MCV: 94.3 fL (ref 80.0–100.0)
Monocytes Absolute: 0.8 10*3/uL (ref 0.1–1.0)
Monocytes Relative: 8 %
Neutro Abs: 6.8 10*3/uL (ref 1.7–7.7)
Neutrophils Relative %: 76 %
Platelets: 240 10*3/uL (ref 150–400)
RBC: 3.15 MIL/uL — ABNORMAL LOW (ref 4.22–5.81)
RDW: 14.3 % (ref 11.5–15.5)
WBC: 9 10*3/uL (ref 4.0–10.5)
nRBC: 0 % (ref 0.0–0.2)

## 2020-07-04 LAB — BASIC METABOLIC PANEL
Anion gap: 10 (ref 5–15)
BUN: 16 mg/dL (ref 8–23)
CO2: 27 mmol/L (ref 22–32)
Calcium: 8.3 mg/dL — ABNORMAL LOW (ref 8.9–10.3)
Chloride: 111 mmol/L (ref 98–111)
Creatinine, Ser: 0.63 mg/dL (ref 0.61–1.24)
GFR, Estimated: >60 mL/min (ref >60)
Glucose, Bld: 118 mg/dL — ABNORMAL HIGH (ref 70–99)
Potassium: 3.1 mmol/L — ABNORMAL LOW (ref 3.5–5.1)
Sodium: 148 mmol/L — ABNORMAL HIGH (ref 135–145)

## 2020-07-04 LAB — MAGNESIUM: Magnesium: 1.6 mg/dL — ABNORMAL LOW (ref 1.7–2.4)

## 2020-07-04 LAB — GLUCOSE, CAPILLARY
Glucose-Capillary: 104 mg/dL — ABNORMAL HIGH (ref 70–99)
Glucose-Capillary: 106 mg/dL — ABNORMAL HIGH (ref 70–99)
Glucose-Capillary: 133 mg/dL — ABNORMAL HIGH (ref 70–99)
Glucose-Capillary: 140 mg/dL — ABNORMAL HIGH (ref 70–99)
Glucose-Capillary: 159 mg/dL — ABNORMAL HIGH (ref 70–99)
Glucose-Capillary: 159 mg/dL — ABNORMAL HIGH (ref 70–99)
Glucose-Capillary: 203 mg/dL — ABNORMAL HIGH (ref 70–99)

## 2020-07-04 LAB — PHOSPHORUS: Phosphorus: 1.5 mg/dL — ABNORMAL LOW (ref 2.5–4.6)

## 2020-07-04 LAB — MRSA PCR SCREENING: MRSA by PCR: NEGATIVE

## 2020-07-04 LAB — VANCOMYCIN, TROUGH: Vancomycin Tr: 18 ug/mL (ref 15–20)

## 2020-07-04 IMAGING — CT CT CHEST W/O CM
2 of 3 series · 14 of 36 positions shown, 17 images · non-contrast
Comparison: Multiple prior radiographs. Lung bases from abdominal
CT [DATE]. Chest CT [DATE]

CLINICAL DATA: Abnormal chest x-ray

COVID positive patient with CHF.
EXAM:
CT CHEST WITHOUT CONTRAST
TECHNIQUE: Multidetector CT imaging of the chest was performed following the
standard protocol without IV contrast.

[Series 3: chest w/o 2mm st · axial · non-contrast · 0.88mm/px · z∈[+1345,+1589]mm · 11 of 144 slices shown, 14 images]
[im 11/144  mediastinal]
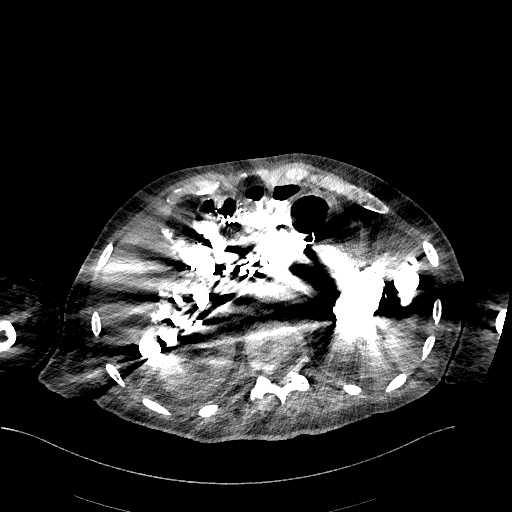
[im 11/144  lung]
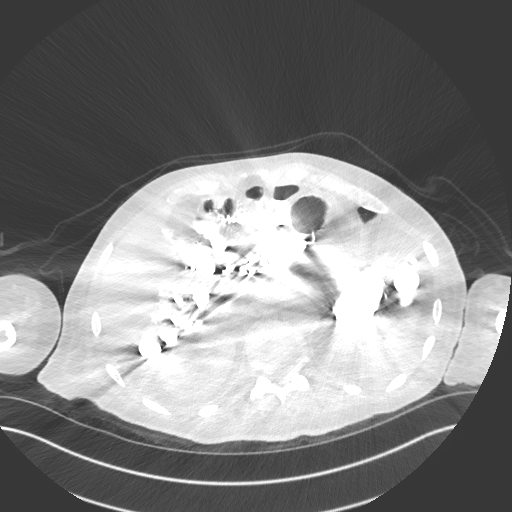
[im 22/144  lung]
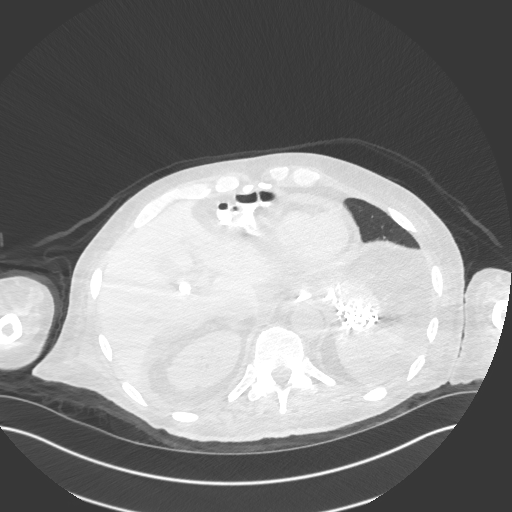
[im 32/144  lung]
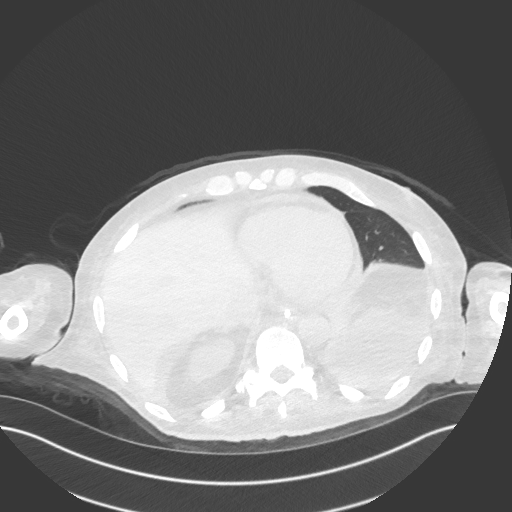
[im 48/144  lung]
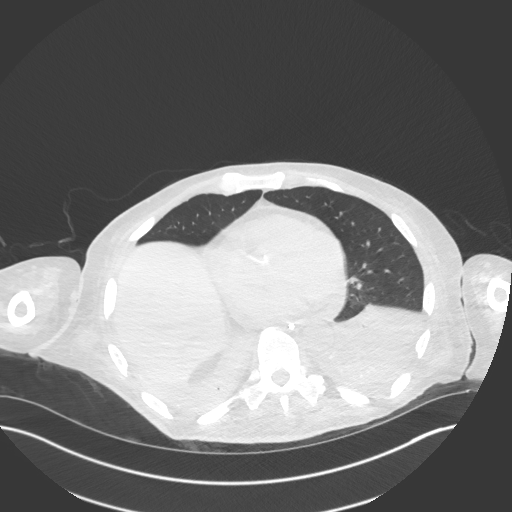
[im 59/144  mediastinal]
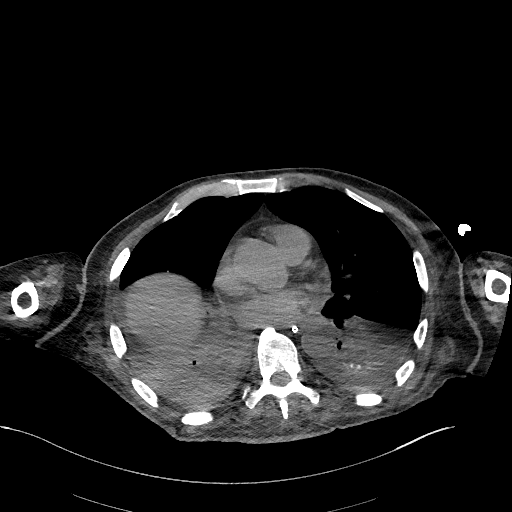
[im 59/144  lung]
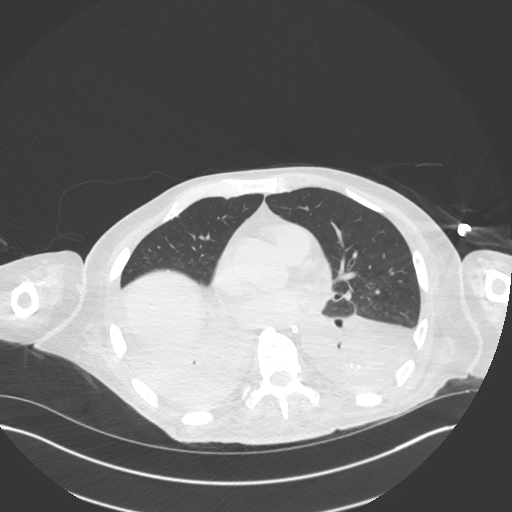
[im 75/144  lung]
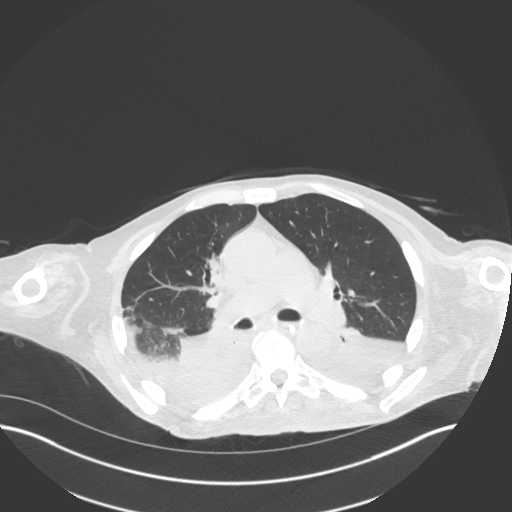
[im 85/144  lung]
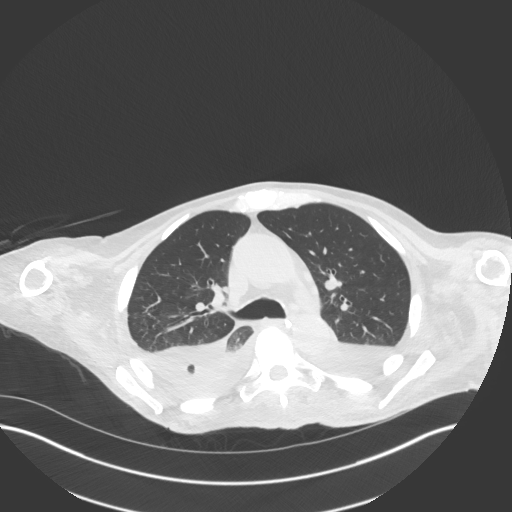
[im 96/144  lung]
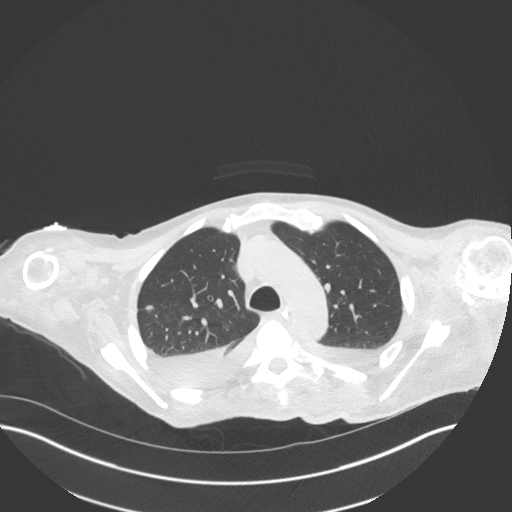
[im 112/144  mediastinal]
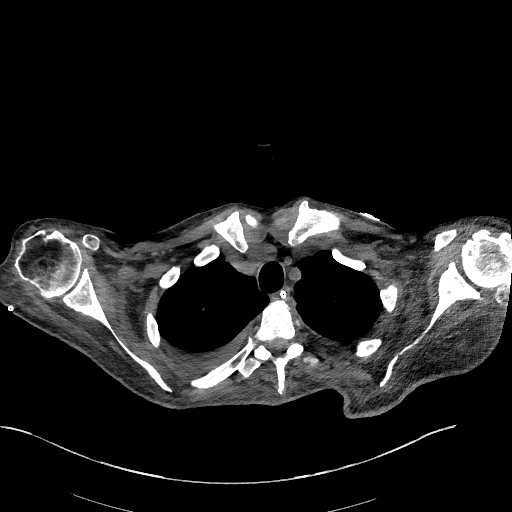
[im 112/144  lung]
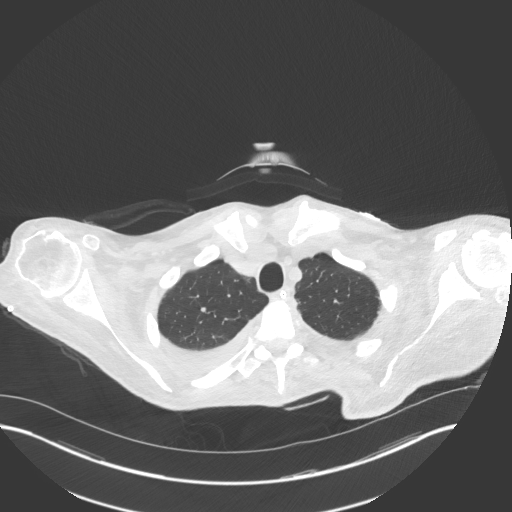
[im 122/144  lung]
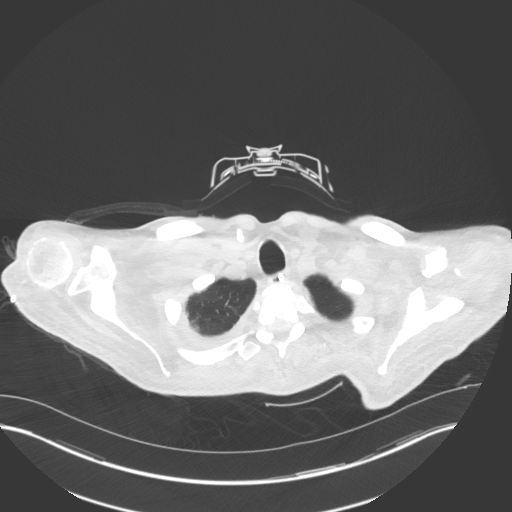
[im 133/144  lung]
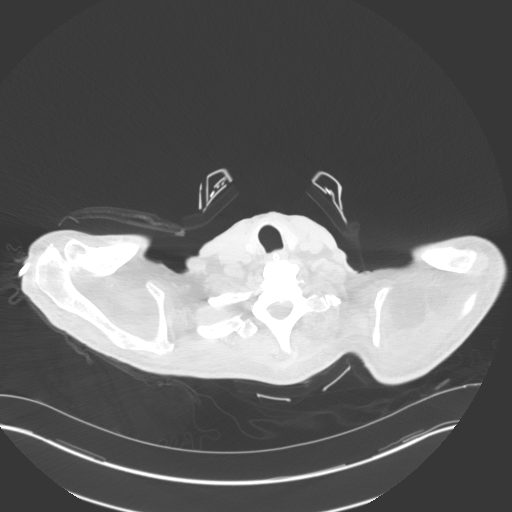

[Series 5: chest w/o 2mm st cor · coronal · non-contrast · 0.56mm/px · 3 of 136 slices shown]
[im 28/136  lung]
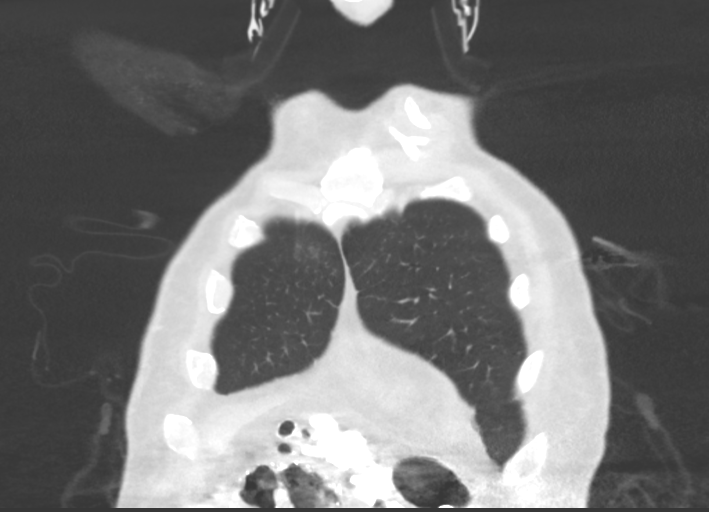
[im 55/136  lung]
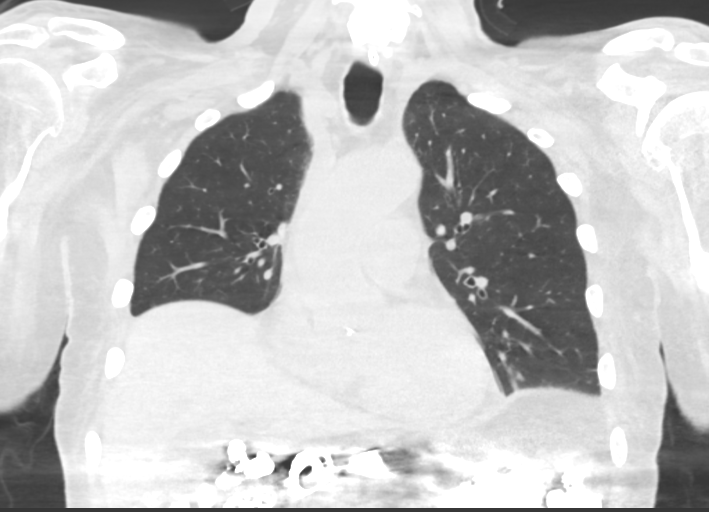
[im 82/136  lung]
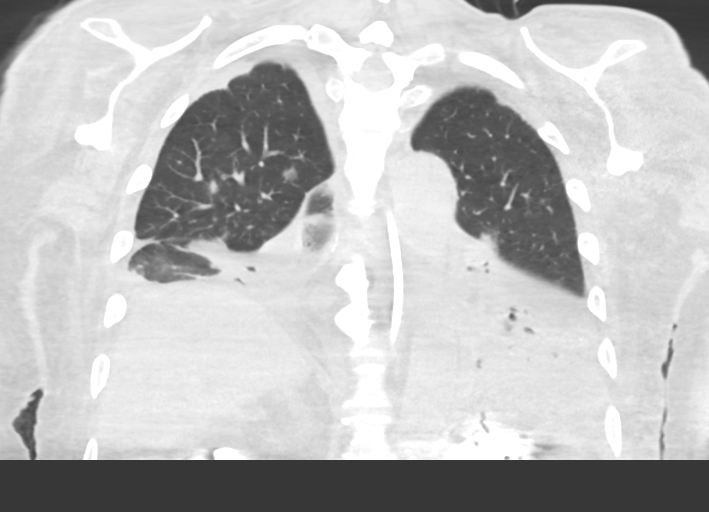

[14 of 36 positions shown; findings below may reference images not displayed]

FINDINGS: Cardiovascular: Mild fusiform dilatation of the ascending aorta at
4.3 cm. No periaortic stranding. Conventional branching pattern from
the aortic arch. Heart is normal in size. There is a small
pericardial effusion measures up to 9 mm adjacent to the right
ventricle. Aortic valve calcifications.

Mediastinum/Nodes: Enteric tube decompresses the esophagus.
Calcified lower paratracheal node. No noncalcified adenopathy. No
suspicious thyroid nodule.

Lungs/Pleura: Complete opacification of the left lower lobe. There
is scattered air bronchograms and parenchymal high-density.
Additional bubbles of air within the consolidative lung are not
definitively bronchial. Small pleural effusion on the left tracks
superior to the consolidation. Layering debris within the left
mainstem and lower lobe bronchi.

Complete filling of the right bronchus intermedius with complete
collapse of the right lower lobe and subtotal collapse of the right
middle lobe. Small foci of air within the consolidated right lower
lobe, with occasional high-density foci. Central air bronchograms in
the right middle lobe. Aerated portion of the right middle lobe
demonstrates patchy airspace disease. 9 mm right upper lobe
pulmonary nodule, series 4, image 51, new from prior. 6 mm right
upper lobe pulmonary nodule series 4, image 55, also new. Mild
additional ground-glass and patchy opacity in the dependent right
upper lobe. Small amount of pleural fluid is primarily loculated
above the right lung consolidation.

No evidence of pulmonary edema.

Upper Abdomen: High-density barium obscures assessment. Small
amounts of upper abdominal ascites.

Musculoskeletal: Spurring and mild flattening of midthoracic
vertebra. There are no acute or suspicious osseous abnormalities.
IMPRESSION: 1. Complete opacification of both lower lobes and subtotal
opacification in the right middle lobe. Right bronchus intermedius
is completely occluded with layering debris in the left mainstem and
lower lobe bronchi. This has progressed from lung bases of recent
abdominal imaging, however right lung findings are similar to
[DATE] exam. Findings are suspicious for recurrent
aspiration. Air bronchograms in the consolidative lung, additional
bubbles of air in the left lower lobe are not definitively
bronchial, developing lung abscess is not excluded, but felt
unlikely.
2. Nodular and slightly ground-glass opacities in the right upper
lobe, new from prior, likely infectious.
3. Small pleural effusions with pleural fluid tracking above the
consolidative process.
4. Stable aneurysmal ascending thoracic aorta at 4.3 cm. Recommend
annual imaging follow-up by CTA or MRA.

## 2020-07-04 IMAGING — DX DG CHEST 1V PORT
1 series · 1 of 1 positions shown · non-contrast
Comparison: [DATE].

CLINICAL DATA: CHF

EXAM:
PORTABLE CHEST 1 VIEW

[chest ap]
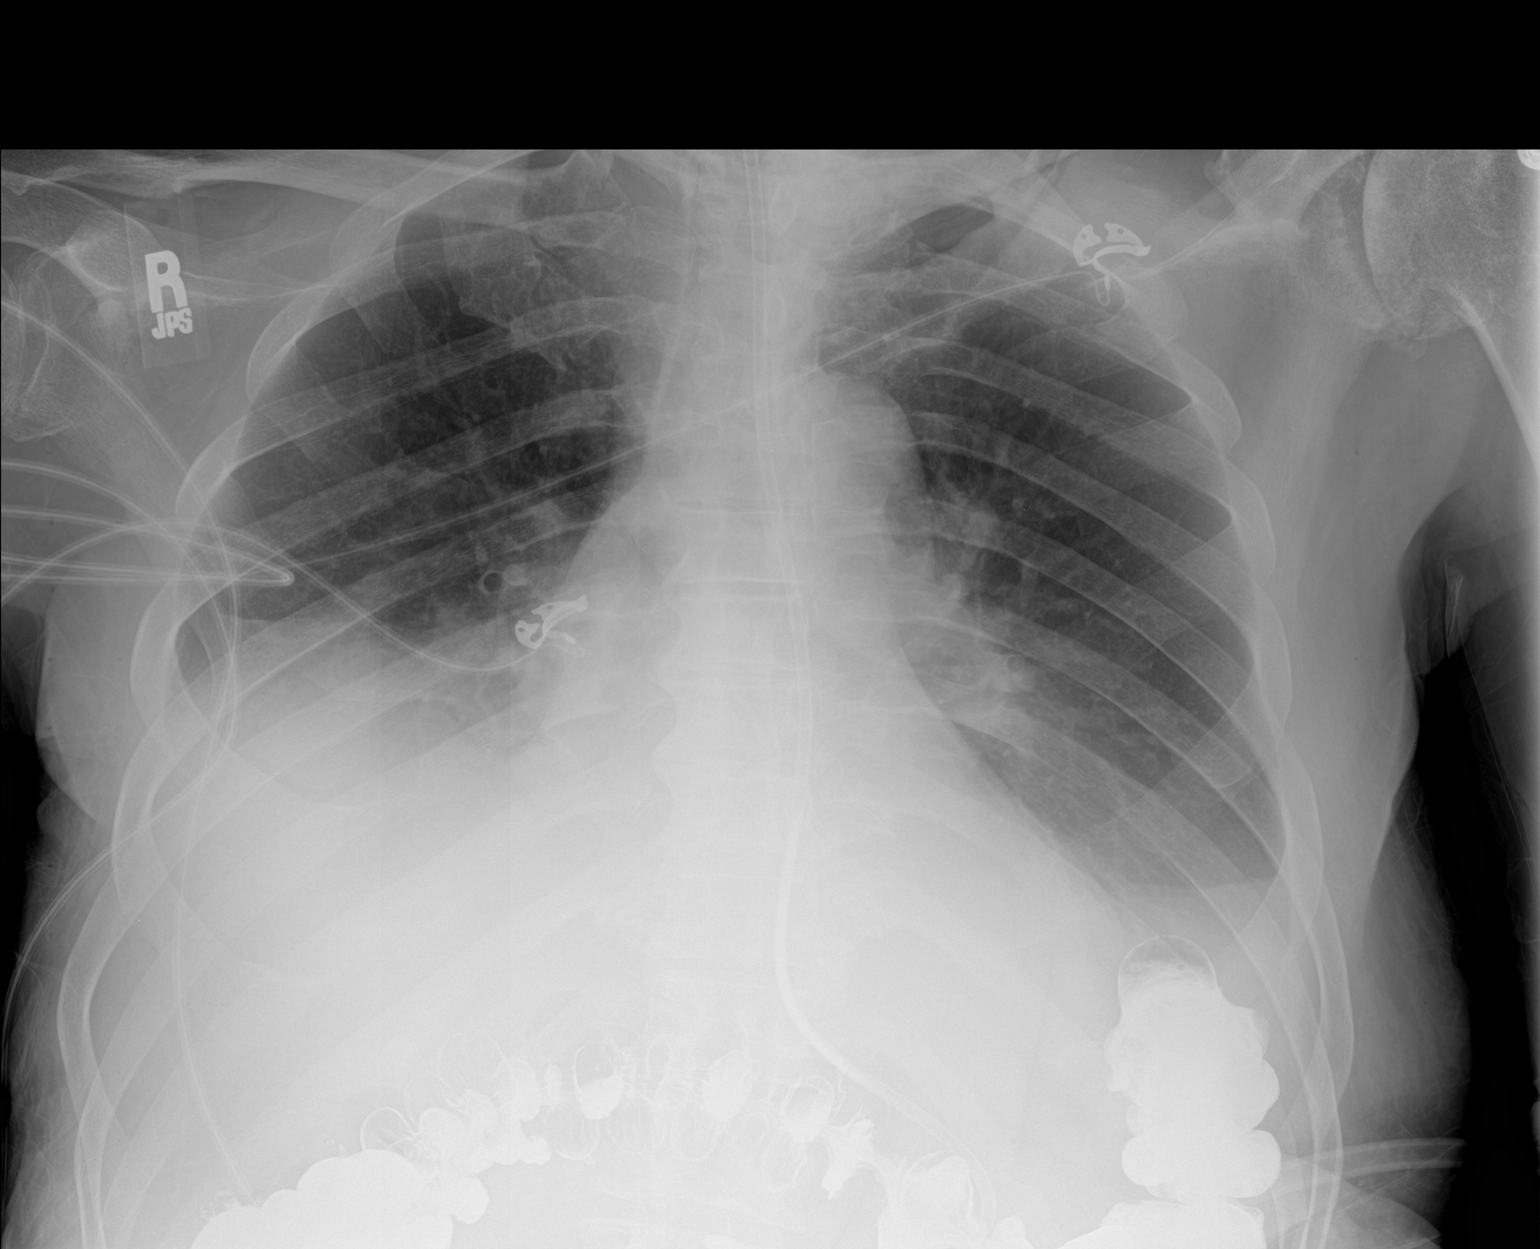

[1 of 1 positions shown; findings below may reference images not displayed]

FINDINGS: Feeding tube noted with tip below left hemidiaphragm. Heart size
stable. No pulmonary venous congestion. Bibasilar pulmonary
infiltrates/edema and bilateral pleural effusions again noted. No
significant interim change. No pneumothorax. Degenerative change
thoracic spine. Barium noted the colon.
IMPRESSION: Bibasilar pulmonary infiltrates/edema and bilateral pleural
effusions again noted. No significant interim change.

## 2020-07-04 MED ORDER — SILVER NITRATE-POT NITRATE 75-25 % EX MISC
1.0000 "application " | CUTANEOUS | Status: DC | PRN
  Filled 2020-07-04: qty 1

## 2020-07-04 MED ORDER — DEXTROSE-NACL 5-0.45 % IV SOLN
INTRAVENOUS | Status: DC
  Administered 2020-07-11: 1 mL via INTRAVENOUS

## 2020-07-04 MED ORDER — K PHOS MONO-SOD PHOS DI & MONO 155-852-130 MG PO TABS
500.0000 mg | ORAL_TABLET | Freq: Two times a day (BID) | ORAL | Status: DC
  Administered 2020-07-04 (×2): 500 mg via ORAL
  Filled 2020-07-04 (×2): qty 2

## 2020-07-04 MED ORDER — MAGNESIUM SULFATE 4 GM/100ML IV SOLN
4.0000 g | Freq: Once | INTRAVENOUS | Status: AC
  Administered 2020-07-04: 4 g via INTRAVENOUS
  Filled 2020-07-04: qty 100

## 2020-07-04 MED ORDER — FREE WATER
200.0000 mL | Freq: Four times a day (QID) | Status: DC
  Administered 2020-07-04 – 2020-07-11 (×23): 200 mL

## 2020-07-04 MED ORDER — VANCOMYCIN HCL 1500 MG/300ML IV SOLN
1500.0000 mg | INTRAVENOUS | Status: DC
  Administered 2020-07-04 – 2020-07-05 (×2): 1500 mg via INTRAVENOUS
  Filled 2020-07-04 (×2): qty 300

## 2020-07-04 MED ORDER — DEXTROSE-NACL 5-0.9 % IV SOLN: INTRAVENOUS | Status: DC

## 2020-07-04 NOTE — Progress Notes (Signed)
PROGRESS NOTE  Shannon [name]  DOB: Apr 07, 1946  PCP: [name], MD IOX:[id number]  DOA: 06/30/2020  LOS: 4 days   Chief Complaint  Patient presents with  . Hematuria    Brief narrative: Shannon [name] is a 75 y.o. 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  Then he had a fall at home developed epidural hematoma, underwent reexploration, removal of hardware, evacuation of hematoma on 10/30, now quadriplegic with dysphagia, unstageable sacral decubitus wounds, failure to thrive-with numerous recent hospitalizations for aspiration pneumonia/hematuria/UTI.   Patient was brought to the ED from SNF for evaluation of recurrent hematuria, cough.  In the ED, he briefly required up to 10 L of oxygen. Chest x-ray showed dense opacity at the right lung base. Urinalysis showed turbid brown urine. Patient was admitted for acute hypoxic respiratory due to aspiration pneumonia and complicated UTI. Urine culture sent on admission grew multidrug-resistant Achromobacter xylosoxidans Of note, patient was diagnosed with COVID-19 during his prior hospitalization on 12/14.  He received monoclonal antibody.  He continues to have a positive PCR but does not have Covid infection.  Subjective: Patient was seen and examined this morning. Lying on bed.  No new symptoms. Looks tired this morning.  Has a feeding tube in place.  Tube feeding ongoing.  Assessment/Plan: SIRS due to complicated UTI and aspiration pneumonia:  -Patient has chronic dysphagia, chronic aspiration tendency.  Chest x-ray showed opacity in the right lung base.   -Patient was afebrile but developed mild leukocytosis. -Started on broad-spectrum antibiotic coverage with meropenem and vancomycin.  Continue the same for 7-day course Recent Labs  Lab 06/30/20 1006 07/01/20 0617 07/02/20 0104 07/03/20 0849  07/04/20 0423  WBC 9.7 13.1* 13.0* 11.9* 9.0  LATICACIDVEN 1.3  --   --   --   --   PROCALCITON <0.10  --   --   --   --    Acute hypoxic respiratory failure due to aspiration pneumonia:  -Briefly required up to 10 L of oxygen initially.  Later titrated down.   -Has had prior palliative care evaluations in the past-remains a full code.  Have reconsulted palliative care for goals of care-see below-re palliative care discussion  Right pleural effusion -Chest x-ray obtained on admission showed moderate-sized pleural effusion on the right. -Patient has low albumin level which probably means making it worse.  We will get ultrasound-guided thoracentesis.  UTI associated with chronic Foley catheter -Urinalysis on admission showed turbid brown urine.  Urine culture grew more than 1000 CFU per mL of multidrug-resistant Achromobacter xylosoxidans. -Continue meropenem for now.  Hematuria secondary to complicated UTI Foley trauma-due to indwelling Foley catheter:  -Hematuria resolved.  Urine is clear now.  Chronic dysphagia/chronic aspiration Severe protein calorie malnutrition -Speech therapy evaluation obtained.  He failed bedside evaluation.  He has poor nutritional status with albumin of 1.3.  I think would benefit from PEG tube placement.  Patient and family agrees to that.  He is being evaluated by IR for PEG tube placement -For the time being, continue Cortrak feeding.  Chronic sacral decubitus ulcer stage IV - POA -Surgery consult obtained.  1/10, patient had bedside debridement.  He had necrotic scar covering the wound.  PT to do hydrotherapy.  Continue dressing changes with Santyl.   History of COVID-19 infection:  -Positive Covid PCR on admission is a sequelae of prior infection-doubt he requires any further treatment or  airborne isolation for this issue.  Continue to observe-his hypoxia is due to aspiration pneumonia.    Quadriplegia with dysphagia-cervical myelopathy-s/p  C-spine surgery on 10/13-subsequently sustained a fall at home and developed epidural hematoma-underwent reexploration/removal of hardware and evacuation of hematoma on 10/30.  Per prior documentation-plan is to leave cervical collar for 3 months total-till mid January.  Continues to have significant quadriplegia (able to move upper extremity side-by-side-unable to lift off the bed against gravity, able to only wiggle toes-unable to lift lower extremities off the bed against gravity)  Hypotension: BP remains stable on midodrine.  I do not see any other blood pressure medicines in the list  Hypernatremia -Sodium level trending up, 148 this morning.  Switch IV fluid to D5 0.45NS at 75 mL/h.  Start on free water through NG tube. Recent Labs  Lab 06/30/20 1006 07/01/20 0617 07/02/20 0104 07/03/20 0849 07/04/20 0423  NA 140 141 142 146* 148*   Hypokalemia/hypomagnesemia/hypophosphatemia -Electrolyte levels low this morning.  Replacement ordered.  Repeat tomorrow. Recent Labs  Lab 06/30/20 1006 07/01/20 0617 07/02/20 0104 07/03/20 0849 07/04/20 0423  K 3.5 3.3* 3.4* 3.2* 3.1*  MG  --   --   --   --  1.6*  PHOS  --   --   --   --  1.5*   Multifactorial anemia-anemia of chronic disease/vitamin B12 deficiency/borderline iron deficiency/acute blood loss due to hematuria:  -Hemoglobin at baseline-continue iron supplementation Recent Labs    05/22/20 1304 05/23/20 0432 06/07/20 0032 06/07/20 0739 06/30/20 1006 07/01/20 0617 07/02/20 0104 07/03/20 0849 07/04/20 0423  HGB  --    < >  --    < > 9.9* 9.8* 9.1* 8.8* 9.1*  MCV  --    < >  --    < > 95.9 94.7 93.6 95.1 94.3  VITAMINB12 160*  --   --   --   --   --  456  --   --   FOLATE 5.9*  --   --   --   --   --   --   --   --   FERRITIN 89  --  907*  --   --   --   --   --   --   TIBC 150*  --   --   --   --   --   --   --   --   IRON 25*  --   --   --   --   --   --   --   --   RETICCTPCT 3.9*  --   --   --   --   --   --   --    --    < > = values in this interval not displayed.   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  Palliative care/goals of care: Has had numerous palliative care evaluations in the past-remains a full code-this MD reached out to patient's niece who asked me to call patient's daughter-she will assessment who I spoke to this morning.  Explained poor prognosis-potential for life-threatening pneumonia/respiratory failure-however she remains resistant to palliative care-and states that she believes in God and that "God is the healer" and "you are  just a vessel"   Patient's overall prognosis remains poor-he is at significant risk for repeated hospitalizations due to recurrent aspirations pneumonia/UTI/sacral decubitus ulcer infection-which potentially could be life  disabling or life-threatening.   Mobility: Quadriplegia at baseline Code Status:   Code Status: Full Code  Nutritional status: Body mass index is 31.69 kg/m. Nutrition Problem: Severe Malnutrition Etiology: chronic illness (chronic wounds) Signs/Symptoms: energy intake < or equal to 75% for > or equal to 1 month,severe fat depletion,severe muscle depletion Diet Order            Diet NPO time specified  Diet effective midnight                 DVT prophylaxis: SCDs Start: 06/30/20 1310   Antimicrobials:  IV meropenem, IV vancomycin.  I will continue the same for now developed 3 possible ongoing infections. Fluid: Normal saline 75 mill per hour Consultants: None Family Communication:  Discussed with daughter on the phone  Status is: Inpatient  Remains inpatient appropriate because: Ongoing sepsis work-up, needs broad-spectrum antibiotics   Dispo: The patient is from: SNF               Anticipated d/c is to: SNF              Anticipated d/c date is: > 3 days              Patient currently is not medically stable to d/c.  Infusions:   . dextrose 5 % and 0.9% NaCl    . feeding supplement (OSMOLITE 1.5 CAL) 30 mL/hr at 07/04/20 1044  . magnesium sulfate bolus IVPB 4 g (07/04/20 1035)  . meropenem (MERREM) IV 2 g (07/04/20 0434)  . sodium chloride irrigation    . vancomycin 1,500 mg (07/04/20 1030)    Scheduled Meds: . acetaminophen  1,000 mg Oral TID  . ascorbic acid  1,000 mg Oral Daily  . Chlorhexidine Gluconate Cloth  6 each Topical Daily  . collagenase   Topical Daily  . escitalopram  2.5 mg Oral QHS  . feeding supplement (PROSource TF)  45 mL Per Tube TID  . ferrous sulfate  325 mg Oral BID WC  . free water  200 mL Per Tube Q6H  . gabapentin  200 mg Oral BID  . mouth rinse  15 mL Mouth Rinse BID  . midodrine  10 mg Oral BID WC  . nutrition supplement (JUVEN)  1 packet Per Tube BID  . pantoprazole  40 mg Oral QHS  . phosphorus  500 mg Oral BID  . polyethylene glycol  17 g Oral q morning - 10a  . scopolamine  1 patch Transdermal Q72H  . sodium chloride flush  3 mL Intravenous Q12H  . tamsulosin  0.4 mg Oral Daily    Antimicrobials: Anti-infectives (From admission, onward)   Start     Dose/Rate Route Frequency Ordered Stop   07/04/20 1600  vancomycin (VANCOREADY) IVPB 1500 mg/300 mL  Status:  Discontinued        1,500 mg 150 mL/hr over 120 Minutes Intravenous Every 24 hours 07/03/20 0904 07/04/20 0900   07/04/20 1000  vancomycin (VANCOREADY) IVPB 1500 mg/300 mL        1,500 mg 150 mL/hr over 120 Minutes Intravenous Every 24 hours 07/04/20 0900     07/01/20 0900  vancomycin (VANCOREADY) IVPB 1500 mg/300 mL  Status:  Discontinued        1,500 mg 150 mL/hr over 120 Minutes Intravenous Every 12 hours 07/01/20 0843 07/03/20 0904   06/30/20 2000  meropenem (MERREM) 2 g in sodium chloride 0.9 % 100 mL IVPB        2 g  200 mL/hr over 30 Minutes Intravenous Every 8 hours 06/30/20 1303     06/30/20 1115  meropenem (MERREM) 2 g in sodium chloride 0.9 % 100 mL IVPB        2 g 200 mL/hr over 30 Minutes  Intravenous  Once 06/30/20 1036 06/30/20 1241      PRN meds: acetaminophen, albuterol, ondansetron **OR** ondansetron (ZOFRAN) IV, oxyCODONE, silver nitrate applicators   Objective: Vitals:   07/04/20 0900 07/04/20 1000  BP:    Pulse: 98 (!) 103  Resp: (!) 22 14  Temp:    SpO2: 95% 95%    Intake/Output Summary (Last 24 hours) at 07/04/2020 1156 Last data filed at 07/04/2020 0302 Gross per 24 hour  Intake 533 ml  Output 700 ml  Net -167 ml   Filed Weights   06/30/20 1143  Weight: 106 kg   Weight change:  Body mass index is 31.69 kg/m.   Physical Exam: General exam: Elderly Caucasian male.  Looks tired and lethargic this morning. Skin: No rashes, lesions or ulcers. HEENT: Has cervical collar in place Lungs: Clear to auscultation bilaterally CVS: Regular rate and rhythm, normal GI/Abd soft, nontender, nondistended, bowel sound present CNS: Alert, awake.  Quadriplegia at baseline Sacral wound: Stage IV sacral wound at the back Psychiatry: Mood appropriate Extremities: No pedal edema, no calf tenderness  Data Review: I have personally reviewed the laboratory data and studies available.  Recent Labs  Lab 06/30/20 1006 07/01/20 0617 07/02/20 0104 07/03/20 0849 07/04/20 0423  WBC 9.7 13.1* 13.0* 11.9* 9.0  NEUTROABS 7.4  --   --  10.1* 6.8  HGB 9.9* 9.8* 9.1* 8.8* 9.1*  HCT 33.0* 32.3* 29.3* 29.3* 29.7*  MCV 95.9 94.7 93.6 95.1 94.3  PLT 258 235 206 216 240   Recent Labs  Lab 06/30/20 1006 07/01/20 0617 07/02/20 0104 07/03/20 0849 07/04/20 0423  NA 140 141 142 146* 148*  K 3.5 3.3* 3.4* 3.2* 3.1*  CL 104 100 105 110 111  CO2 28 27 24 25 27   GLUCOSE 101* 106* 83 79 118*  BUN 8 9 9  6* 16  CREATININE 0.42* 0.52* 0.61 0.65 0.63  CALCIUM 8.1* 7.9* 7.8* 8.1* 8.3*  MG  --   --   --   --  1.6*  PHOS  --   --   --   --  1.5*    F/u labs ordered  Signed, Lorin GlassBinaya Daylin Gruszka, MD [facility] 07/04/2020

## 2020-07-04 NOTE — Progress Notes (Signed)
Pt remains NPO after midnight for scheduled IR G-tube placement.

## 2020-07-04 NOTE — Progress Notes (Signed)
Patient tentatively planned for g-tube placement today in IR however due to miscommunication tube feeds were not held per floor RN.  Will plan for g-tube placement tomorrow (1/12) in IR. Patient to be NPO/hold tube feeds at midnight on 1/12, patient to be given 1 cup thin barium via NGT/cortrak at 1700 today, hold anticoagulation/antiplatelet medications until post procedure (none noted on [facility]), IR will call for patient when ready.  New orders placed and discussed with floor RN today. Please call with questions or concerns.  Lynnette Caffey, PA-C

## 2020-07-04 NOTE — Care Management Important Message (Signed)
Important Message  Patient Details  Name: Shannon [name] MRN: [id number] Date of Birth: [birth year]   Medicare Important Message Given:  Yes     Hailee Hollick Stefan Church 07/04/2020, 1:16 PM

## 2020-07-04 NOTE — Progress Notes (Signed)
Pharmacy Antibiotic Note  Shannon [name] is a 75 y.o. male admitted on 06/30/2020 with pneumonia.  Pharmacy  consulted for Vancomycin and meropenem dosing.  CrCl >100 ml/min.  SCr 0.63 stable.  Vancomycin random level/trough = 18 mcg/ml approximately 24 hours post last vancomycin dose.  Level < 20, okay to give dose this AM 07/04/20.  Dose has been adjusted to 1500 mg IV q24hr for predicted AUC = 446, using  SCr 1; used Vd 0.5 L/kg due to BMI >30.   Vancomycin Goal AUC 400-550.   Plan: Adjust Vancomycin to 1500 mg IV q24hr, star new doset this morning 07/04/20. Continue Meropenem 2 g IV q8 hr.  Monitor renal function, clinical status, cultures and vanc levels as needed.  Height: 6' (182.9 cm) Weight: 106 kg (233 lb 11 oz) IBW/kg (Calculated) : 77.6  Temp (24hrs), Avg:98.4 F (36.9 C), Min:97.5 F (36.4 C), Max:100 F (37.8 C)  Recent Labs  Lab 06/30/20 1006 07/01/20 0617 07/02/20 0104 07/03/20 0849 07/04/20 0423 07/04/20 0714  WBC 9.7 13.1* 13.0* 11.9* 9.0  --   CREATININE 0.42* 0.52* 0.61 0.65 0.63  --   LATICACIDVEN 1.3  --   --   --   --   --   VANCOTROUGH  --   --   --   --   --  18    Estimated Creatinine Clearance: 102 mL/min (by C-G formula based on SCr of 0.63 mg/dL).    Allergies  Allergen Reactions  . Iodine Swelling  . Penicillins Swelling    ** tolerates cephalosporins Facial swelling, itchy throat    Antimicrobials this admission: Vanc 1/8 >>  Meropenem 1/7>>  Thank you for allowing pharmacy to be a part of this patient's care.  Noah Delaine, RPh Clinical Pharmacist Please see AMION for all Pharmacists' Contact Phone Numbers 07/04/2020, 9:21 AM

## 2020-07-04 NOTE — Plan of Care (Signed)

## 2020-07-04 NOTE — Progress Notes (Signed)
Received a call from central Tele that patient had 3 bts of Vtach non sustained.

## 2020-07-04 NOTE — Progress Notes (Addendum)
Physical Therapy Wound Treatment Patient Details  Name: Shannon [name] MRN: [id number] Date of Birth: 26-Jun-1945  Today's Date: 07/04/2020 Time: 0818-0928 Time Calculation (min): 70 min  Subjective  Subjective: Agreeable to hydrotherapy; lethargic Patient and Family Stated Goals: did not state Date of Onset:  (unknown) Prior Treatments: Dressing change, hydrotherapy prior admission 05/2020, s/p bedside debridement 07/04/2019  Pain Score:  5/10   Wound Assessment  Pressure Injury 06/22/20 Sacrum Posterior Stage 4 - Full thickness tissue loss with exposed bone, tendon or muscle. PHYSICAL THERAPY/HYDROTHERAPY (Active)  Wound Image   07/04/20 1355  Dressing Type ABD;Barrier Film (skin prep);Gauze (Comment);Moist to moist 07/04/20 1355  Dressing Changed;Dry;Clean;Intact 07/04/20 1355  Dressing Change Frequency Daily 07/04/20 1355  State of Healing Eschar 07/04/20 1355  Site / Wound Assessment Bleeding;Black;Red;Yellow 07/04/20 1355  % Wound base Red or Granulating 20% 07/04/20 1355  % Wound base Yellow/Fibrinous Exudate 60% 07/04/20 1355  % Wound base Black/Eschar 10% 07/04/20 1355  % Wound base Other/Granulation Tissue (Comment) 0% 07/04/20 1355  Peri-wound Assessment Pink;Erythema (non-blanchable) 07/04/20 1355  Wound Length (cm) 11 cm 07/04/20 1355  Wound Width (cm) 11 cm 07/04/20 1355  Wound Depth (cm) 2 cm 07/04/20 1355  Wound Surface Area (cm^2) 121 cm^2 07/04/20 1355  Wound Volume (cm^3) 242 cm^3 07/04/20 1355  Undermining (cm) 3 07/04/20 1355  Margins Unattached edges (unapproximated) 07/04/20 1355  Drainage Amount Moderate 07/04/20 1355  Drainage Description Serosanguineous 07/04/20 1355  Treatment Debridement (Selective);Hydrotherapy (Pulse lavage);Packing (Saline gauze) 07/04/20 1355  Santyl applied to wound bed prior to applying dressing.    Hydrotherapy Pulsed lavage therapy - wound location: sacrum Pulsed Lavage with Suction (psi): 12 psi Pulsed Lavage with  Suction - Normal Saline Used: 1000 mL Pulsed Lavage Tip: Tip with splash shield Selective Debridement Selective Debridement - Location: Sacrum Selective Debridement - Tools Used: Forceps;Scalpel Selective Debridement - Tissue Removed: slough   Wound Assessment and Plan  Wound Therapy - Assess/Plan/Recommendations Wound Therapy - Clinical Statement: Pt presents with unstageable sacral pressure injury s/p bedside debridement by surgery team. Continued sharp debridement today of slough; areas of wound bleeding easily, requiring compression for extended period of time. Pt will benefit from hydrotherapy for selective debridement and to decrease bioburden. Wound Therapy - Functional Problem List: Decreased mobility and global weakness Factors Delaying/Impairing Wound Healing: Immobility Hydrotherapy Plan: Debridement;Dressing change;Patient/family education;Pulsatile lavage with suction Wound Therapy - Frequency: 6X / week Wound Therapy - Follow Up Recommendations: Skilled nursing facility Wound Plan: See above  Wound Therapy Goals- Improve the function of patient's integumentary system by progressing the wound(s) through the phases of wound healing (inflammation - proliferation - remodeling) by: Decrease Necrotic Tissue to: 50 Decrease Necrotic Tissue - Progress: Goal set today Increase Granulation Tissue to: 50 Increase Granulation Tissue - Progress: Goal set today Goals/treatment plan/discharge plan were made with and agreed upon by patient/family: Yes Time For Goal Achievement: 7 days Wound Therapy - Potential for Goals: Fair  Goals will be updated until maximal potential achieved or discharge criteria met.  Discharge criteria: when goals achieved, discharge from hospital, MD decision/surgical intervention, no progress towards goals, refusal/missing three consecutive treatments without notification or medical reason.  GP  Wyona Almas, PT, DPT Acute Rehabilitation Services Pager  [phone] Office [phone]      Deno Etienne 07/04/2020, 2:07 PM

## 2020-07-05 ENCOUNTER — Inpatient Hospital Stay (HOSPITAL_COMMUNITY): Payer: Medicare Other

## 2020-07-05 DIAGNOSIS — J9601 Acute respiratory failure with hypoxia: Secondary | ICD-10-CM | POA: Diagnosis not present

## 2020-07-05 DIAGNOSIS — J690 Pneumonitis due to inhalation of food and vomit: Secondary | ICD-10-CM | POA: Diagnosis not present

## 2020-07-05 HISTORY — PX: IR GASTROSTOMY TUBE MOD SED: IMG625

## 2020-07-05 LAB — BASIC METABOLIC PANEL
Anion gap: 8 (ref 5–15)
BUN: 23 mg/dL (ref 8–23)
CO2: 30 mmol/L (ref 22–32)
Calcium: 8.1 mg/dL — ABNORMAL LOW (ref 8.9–10.3)
Chloride: 111 mmol/L (ref 98–111)
Creatinine, Ser: 0.39 mg/dL — ABNORMAL LOW (ref 0.61–1.24)
GFR, Estimated: >60 mL/min (ref >60)
Glucose, Bld: 164 mg/dL — ABNORMAL HIGH (ref 70–99)
Potassium: 2.8 mmol/L — ABNORMAL LOW (ref 3.5–5.1)
Sodium: 149 mmol/L — ABNORMAL HIGH (ref 135–145)

## 2020-07-05 LAB — CBC WITH DIFFERENTIAL/PLATELET
Abs Immature Granulocytes: 0.06 10*3/uL (ref 0.00–0.07)
Basophils Absolute: 0.1 10*3/uL (ref 0.0–0.1)
Basophils Relative: 1 %
Eosinophils Absolute: 0.1 10*3/uL (ref 0.0–0.5)
Eosinophils Relative: 1 %
HCT: 31.3 % — ABNORMAL LOW (ref 39.0–52.0)
Hemoglobin: 9.3 g/dL — ABNORMAL LOW (ref 13.0–17.0)
Immature Granulocytes: 1 %
Lymphocytes Relative: 15 %
Lymphs Abs: 1.3 10*3/uL (ref 0.7–4.0)
MCH: 28.5 pg (ref 26.0–34.0)
MCHC: 29.7 g/dL — ABNORMAL LOW (ref 30.0–36.0)
MCV: 96 fL (ref 80.0–100.0)
Monocytes Absolute: 0.7 10*3/uL (ref 0.1–1.0)
Monocytes Relative: 8 %
Neutro Abs: 6.1 10*3/uL (ref 1.7–7.7)
Neutrophils Relative %: 74 %
Platelets: 230 10*3/uL (ref 150–400)
RBC: 3.26 MIL/uL — ABNORMAL LOW (ref 4.22–5.81)
RDW: 14.3 % (ref 11.5–15.5)
WBC: 8.2 10*3/uL (ref 4.0–10.5)
nRBC: 0 % (ref 0.0–0.2)

## 2020-07-05 LAB — CULTURE, BLOOD (ROUTINE X 2)
Culture: NO GROWTH
Culture: NO GROWTH
Special Requests: ADEQUATE
Special Requests: ADEQUATE

## 2020-07-05 LAB — GLUCOSE, CAPILLARY
Glucose-Capillary: 138 mg/dL — ABNORMAL HIGH (ref 70–99)
Glucose-Capillary: 131 mg/dL — ABNORMAL HIGH (ref 70–99)
Glucose-Capillary: 123 mg/dL — ABNORMAL HIGH (ref 70–99)
Glucose-Capillary: 118 mg/dL — ABNORMAL HIGH (ref 70–99)

## 2020-07-05 LAB — PHOSPHORUS: Phosphorus: 1.2 mg/dL — ABNORMAL LOW (ref 2.5–4.6)

## 2020-07-05 LAB — MAGNESIUM: Magnesium: 2.3 mg/dL (ref 1.7–2.4)

## 2020-07-05 IMAGING — XA IR PERC PLACEMENT GASTROSTOMY
7 of 8 series · 12 of 24 positions shown · non-contrast
Comparison: none

INDICATION: 74-year-old male with quadriplegia and dysphagia.

[Series 2: fl (-) angio · 2 of 13 frames shown (1 of 6)]
[frame 2/13]
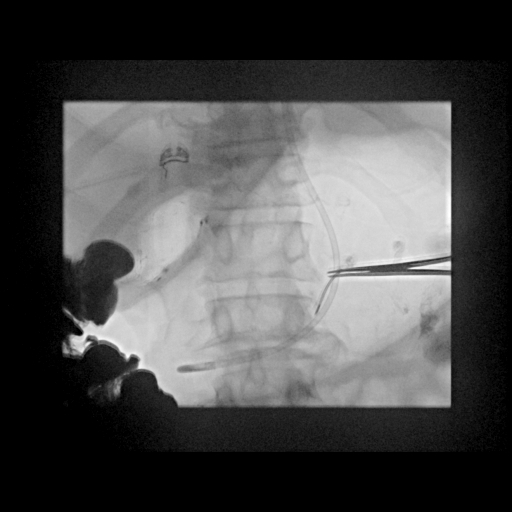
[frame 7/13]
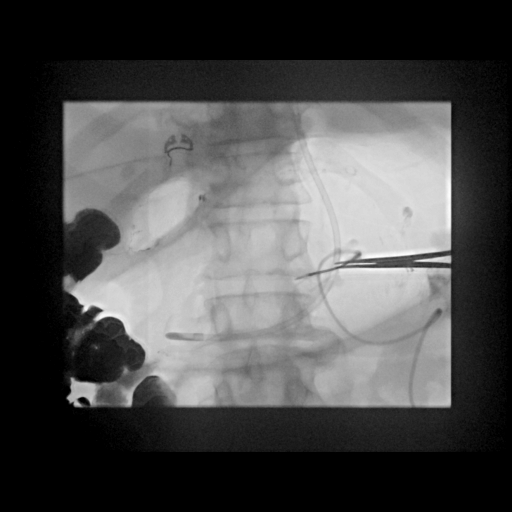

[Series 3: fl (-) angio · 2 of 18 frames shown (2 of 6)]
[frame 7/18]
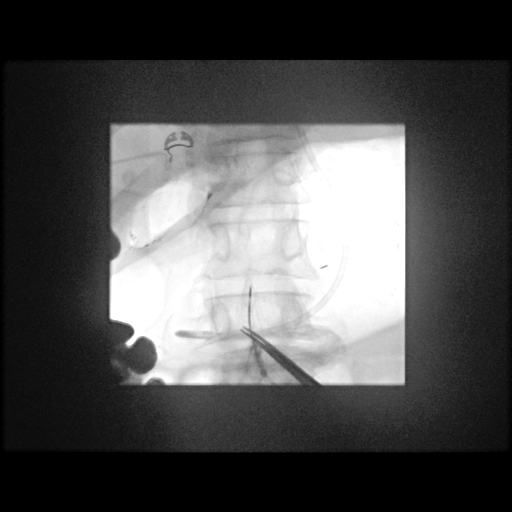
[frame 16/18]
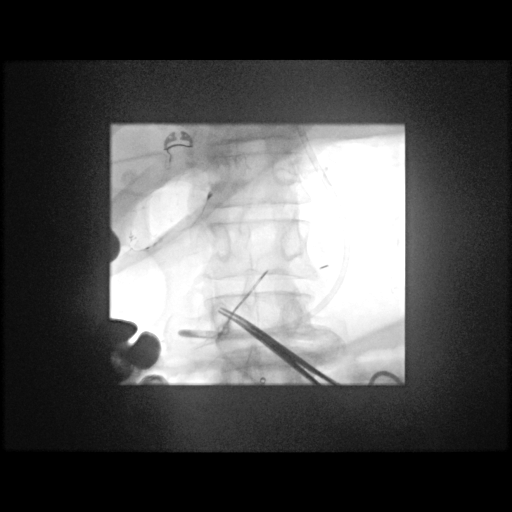

[Series 4: fl (-) angio · 2 of 9 frames shown (3 of 6)]
[frame 5/9]
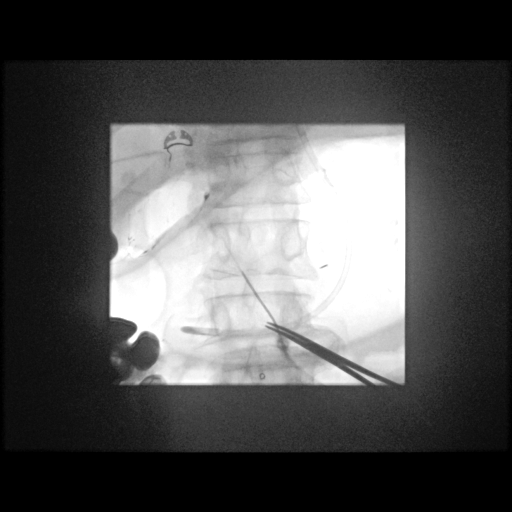
[frame 9/9]
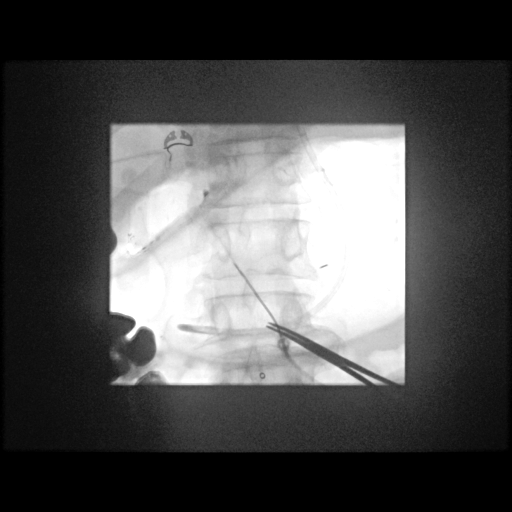

[Series 5: fl (-) angio · 1 of 10 frames shown (4 of 6)]
[frame 6/10]
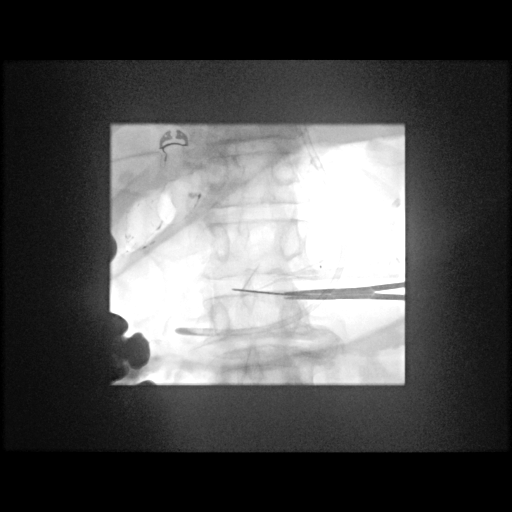

[Series 6: fl (-) angio · 2 of 13 frames shown (5 of 6)]
[frame 1/13]
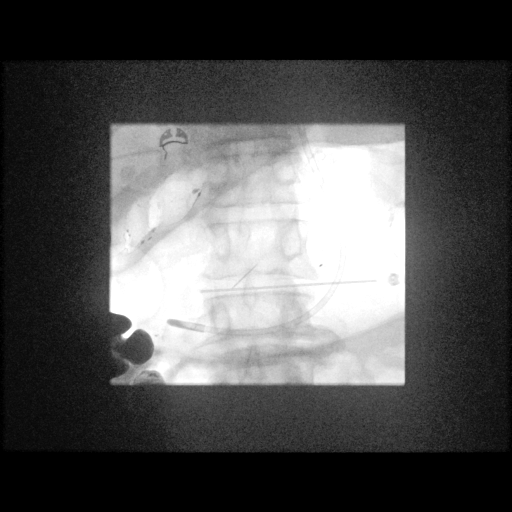
[frame 7/13]
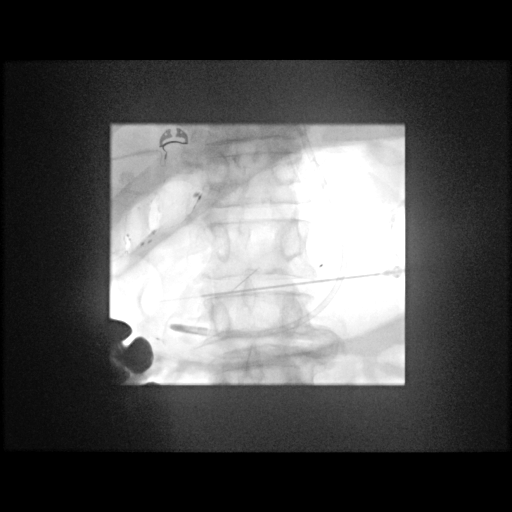

[Series 7: fl (-) angio · 1 of 1 slices shown (6 of 6)]
[im 1/1]
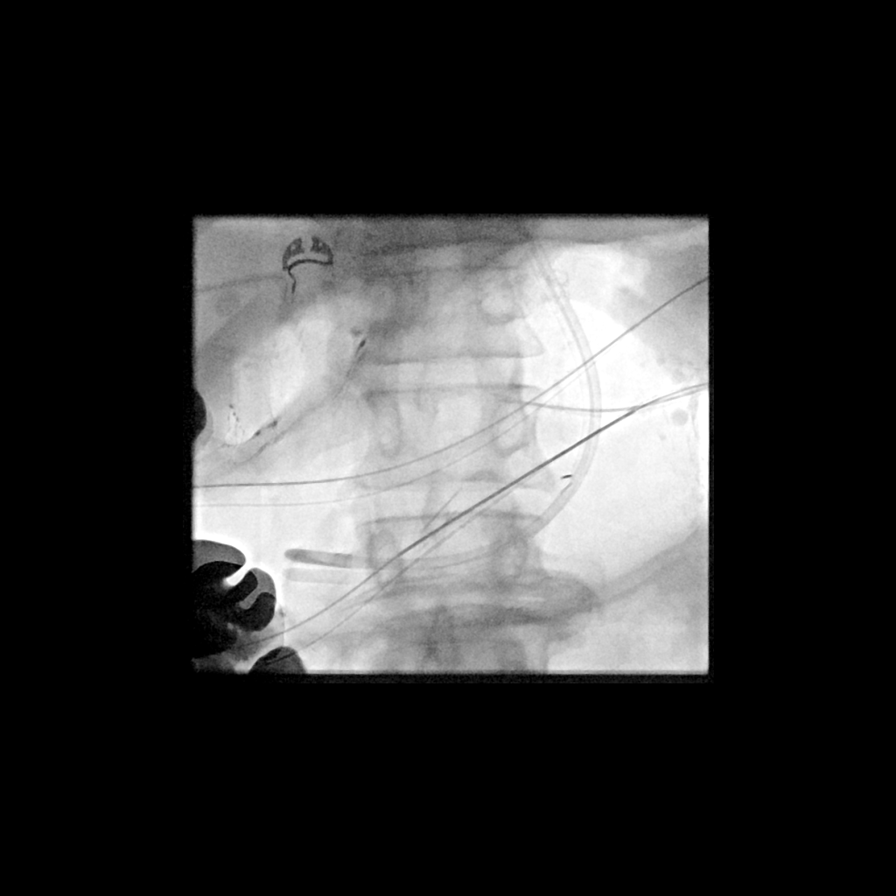

[Series 9: body 4 care · 2 acquisitions, 2 frames shown]
[im 1/2]
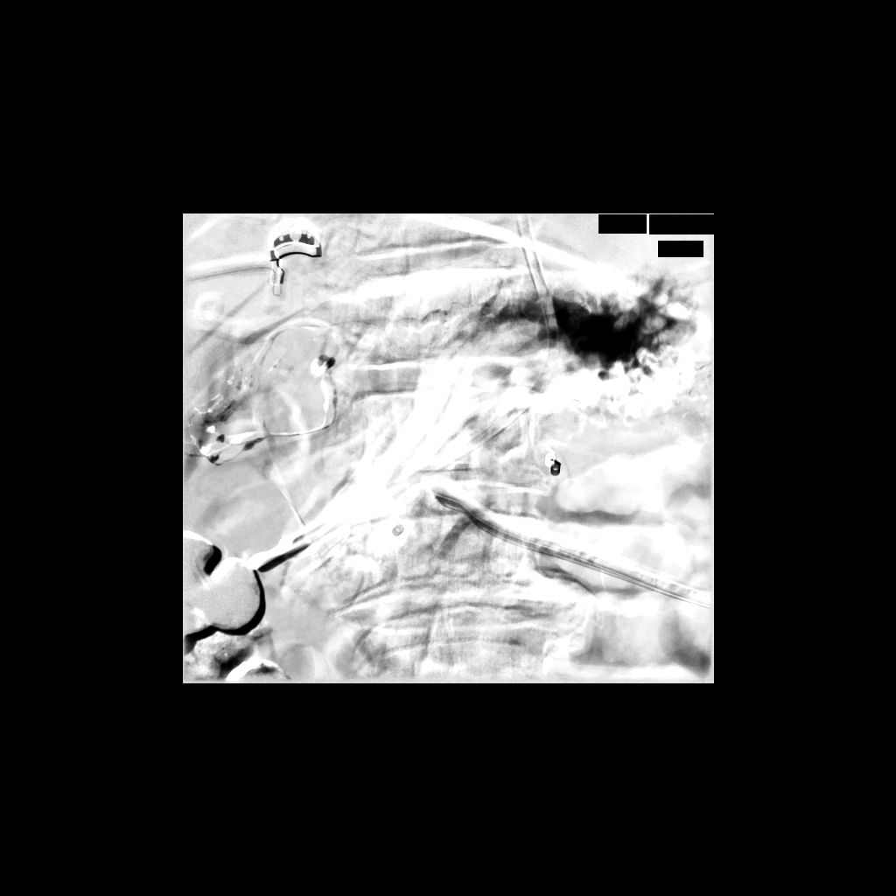
[im 2/2]
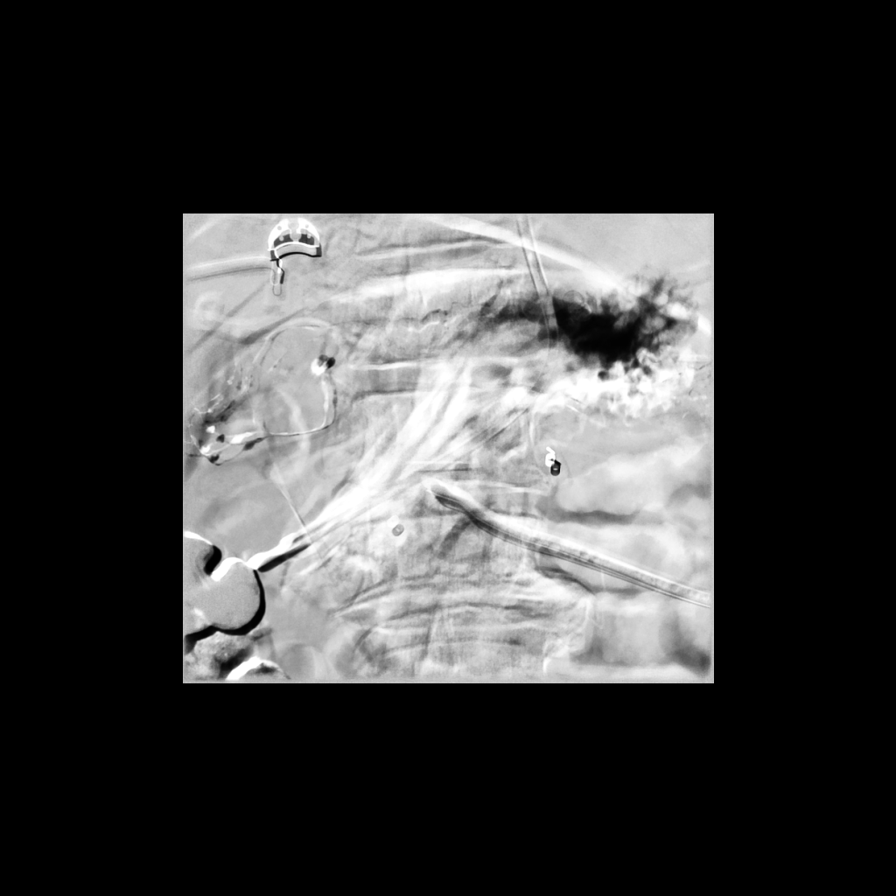

[12 of 24 positions shown; findings below may reference images not displayed]

EXAM:
PERC PLACEMENT GASTROSTOMY

MEDICATIONS:
None.

ANESTHESIA/SEDATION:
Versed 0.5 mg IV; Fentanyl 25 mcg IV

Moderate Sedation Time:  17

The patient was continuously monitored during the procedure by the
interventional radiology nurse under my direct supervision.

CONTRAST:  20mL OMNIPAQUE IOHEXOL 300 MG/ML SOLN - administered into
the gastric lumen.

FLUOROSCOPY TIME:  Fluoroscopy Time: 0 minutes 36 seconds (5 mGy).

COMPLICATIONS:
None immediate.

PROCEDURE:
Informed written consent was obtained from the patient after a
thorough discussion of the procedural risks, benefits and
alternatives. All questions were addressed. Maximal Sterile Barrier
Technique was utilized including caps, mask, sterile gowns, sterile
gloves, sterile drape, hand hygiene and skin antiseptic. A timeout
was performed prior to the initiation of the procedure.

The patient was placed on the procedure table in the supine
position. Pre-procedure abdominal film confirmed visualization of
the transverse colon. The patient was prepped and draped in usual
sterile fashion. The stomach was insufflated with air via the
indwelling nasogastric tube. Under fluoroscopy, a puncture site was
selected and local analgesia achieved with 1% lidocaine infiltrated
subcutaneously.

Under fluoroscopic guidance, a gastropexy needle was passed into the
stomach and the T-bar suture was released. Entry into the stomach
was confirmed with fluoroscopy, aspiration of air, and injection of
contrast material. This was repeated with an additional gastropexy
suture (for a total of 2 fasteners). At the center of these
gastropexy sutures, a dermatotomy was performed. An 18 gauge needle
was passed into the stomach atthe site of this dermatotomy, and
position within the gastric lumen again confirmed under fluoroscopy
using aspiration of air and contrast injection. An Amplatz guidewire
was passed through this needle and intraluminal placement within the
stomach was confirmed by fluoroscopy. The needle was removed. Over
the guidewire, the percutaneous tract was dilated using a 10 mm
non-compliant balloon. The balloon was deflated, then pushed into
the gastric lumen followed in concert by the 20 Fr gastrostomy tube.

The retention balloon of the percutaneous gastrostomy tube was
inflated with 10 mL of sterile water. The tube was withdrawn until
the retention balloon was at the edge of the gastric lumen. The
external bumper was brought to the abdominal wall. Contrast was
injected through the gastrostomy tube, confirming intraluminal
positioning. The patient tolerated the procedure well without any
immediate post-procedural complications.
IMPRESSION: Technically successful placement of 20 Fr gastrostomy tube.

PLAN:
Return in 2 weeks for gastropexy suture release.

## 2020-07-05 MED ORDER — FERROUS SULFATE 220 (44 FE) MG/5ML PO ELIX
325.0000 mg | ORAL_SOLUTION | Freq: Two times a day (BID) | ORAL | Status: DC
  Filled 2020-07-05: qty 7.4

## 2020-07-05 MED ORDER — MIDODRINE HCL 5 MG PO TABS
10.0000 mg | ORAL_TABLET | Freq: Two times a day (BID) | ORAL | Status: DC
  Administered 2020-07-05 – 2020-07-11 (×12): 10 mg
  Filled 2020-07-05 (×12): qty 2

## 2020-07-05 MED ORDER — ENOXAPARIN SODIUM 40 MG/0.4ML ~~LOC~~ SOLN: 40.0000 mg | SUBCUTANEOUS | Status: DC

## 2020-07-05 MED ORDER — LIDOCAINE VISCOUS HCL 2 % MT SOLN
OROMUCOSAL | Status: AC
  Filled 2020-07-05: qty 15

## 2020-07-05 MED ORDER — LIDOCAINE-EPINEPHRINE 1 %-1:100000 IJ SOLN
INTRAMUSCULAR | Status: AC | PRN
  Administered 2020-07-05: 20 mL via INTRADERMAL

## 2020-07-05 MED ORDER — ONDANSETRON HCL 4 MG/2ML IJ SOLN: 4.0000 mg | Freq: Four times a day (QID) | INTRAMUSCULAR | Status: DC | PRN

## 2020-07-05 MED ORDER — MIDAZOLAM HCL 2 MG/2ML IJ SOLN
INTRAMUSCULAR | Status: AC | PRN
  Administered 2020-07-05: 0.5 mg via INTRAVENOUS

## 2020-07-05 MED ORDER — ONDANSETRON HCL 4 MG PO TABS: 4.0000 mg | ORAL_TABLET | Freq: Four times a day (QID) | ORAL | Status: DC | PRN

## 2020-07-05 MED ORDER — ASCORBIC ACID 500 MG PO TABS
1000.0000 mg | ORAL_TABLET | Freq: Every day | ORAL | Status: DC
  Administered 2020-07-06 – 2020-07-11 (×6): 1000 mg
  Filled 2020-07-05 (×6): qty 2

## 2020-07-05 MED ORDER — ACETAMINOPHEN 500 MG PO TABS
1000.0000 mg | ORAL_TABLET | Freq: Three times a day (TID) | ORAL | Status: DC
  Administered 2020-07-05 – 2020-07-11 (×18): 1000 mg
  Filled 2020-07-05 (×18): qty 2

## 2020-07-05 MED ORDER — POTASSIUM PHOSPHATES 15 MMOLE/5ML IV SOLN
30.0000 mmol | Freq: Once | INTRAVENOUS | Status: AC
  Administered 2020-07-05: 30 mmol via INTRAVENOUS
  Filled 2020-07-05: qty 10

## 2020-07-05 MED ORDER — POTASSIUM CHLORIDE 20 MEQ PO PACK: 40.0000 meq | PACK | Freq: Two times a day (BID) | ORAL | Status: DC

## 2020-07-05 MED ORDER — LIDOCAINE-EPINEPHRINE 1 %-1:100000 IJ SOLN
INTRAMUSCULAR | Status: AC
  Filled 2020-07-05: qty 1

## 2020-07-05 MED ORDER — GABAPENTIN 250 MG/5ML PO SOLN
200.0000 mg | Freq: Two times a day (BID) | ORAL | Status: DC
  Administered 2020-07-06 – 2020-07-11 (×11): 200 mg
  Filled 2020-07-05 (×14): qty 4

## 2020-07-05 MED ORDER — ENOXAPARIN SODIUM 40 MG/0.4ML ~~LOC~~ SOLN
40.0000 mg | SUBCUTANEOUS | Status: DC
  Administered 2020-07-06 – 2020-07-11 (×6): 40 mg via SUBCUTANEOUS
  Filled 2020-07-05 (×7): qty 0.4

## 2020-07-05 MED ORDER — STERILE WATER FOR INJECTION IJ SOLN
INTRAMUSCULAR | Status: AC
  Filled 2020-07-05: qty 10

## 2020-07-05 MED ORDER — ESCITALOPRAM OXALATE 5 MG PO TABS
2.5000 mg | ORAL_TABLET | Freq: Every day | ORAL | Status: DC
  Filled 2020-07-05 (×2): qty 1

## 2020-07-05 MED ORDER — OXYCODONE HCL 5 MG PO TABS
2.5000 mg | ORAL_TABLET | Freq: Four times a day (QID) | ORAL | Status: DC | PRN
  Administered 2020-07-05 – 2020-07-08 (×4): 2.5 mg
  Filled 2020-07-05 (×4): qty 1

## 2020-07-05 MED ORDER — FENTANYL CITRATE (PF) 100 MCG/2ML IJ SOLN
INTRAMUSCULAR | Status: AC | PRN
  Administered 2020-07-05: 25 ug via INTRAVENOUS

## 2020-07-05 MED ORDER — PANTOPRAZOLE SODIUM 40 MG PO PACK
40.0000 mg | PACK | Freq: Every day | ORAL | Status: DC
  Administered 2020-07-06 – 2020-07-10 (×5): 40 mg
  Filled 2020-07-05 (×5): qty 20

## 2020-07-05 MED ORDER — FENTANYL CITRATE (PF) 100 MCG/2ML IJ SOLN
INTRAMUSCULAR | Status: AC
  Filled 2020-07-05: qty 2

## 2020-07-05 MED ORDER — MIDAZOLAM HCL 2 MG/2ML IJ SOLN
INTRAMUSCULAR | Status: AC
  Filled 2020-07-05: qty 2

## 2020-07-05 MED ORDER — POLYETHYLENE GLYCOL 3350 17 G PO PACK
17.0000 g | PACK | Freq: Every morning | ORAL | Status: DC
  Administered 2020-07-06 – 2020-07-11 (×6): 17 g
  Filled 2020-07-05 (×6): qty 1

## 2020-07-05 MED ORDER — K PHOS MONO-SOD PHOS DI & MONO 155-852-130 MG PO TABS
500.0000 mg | ORAL_TABLET | Freq: Two times a day (BID) | ORAL | Status: AC
  Administered 2020-07-06 (×2): 500 mg via NASOGASTRIC
  Filled 2020-07-05 (×2): qty 2

## 2020-07-05 MED ORDER — IOHEXOL 300 MG/ML  SOLN
50.0000 mL | Freq: Once | INTRAMUSCULAR | Status: AC | PRN
  Administered 2020-07-05: 20 mL

## 2020-07-05 MED ORDER — FERROUS SULFATE 300 (60 FE) MG/5ML PO SYRP
300.0000 mg | ORAL_SOLUTION | Freq: Two times a day (BID) | ORAL | Status: DC
  Administered 2020-07-05 – 2020-07-11 (×12): 300 mg
  Filled 2020-07-05 (×13): qty 5

## 2020-07-05 NOTE — Plan of Care (Signed)

## 2020-07-05 NOTE — Progress Notes (Signed)
PROGRESS NOTE  Shannon [name]  DOB: 1945/10/09  PCP: [name], MD ZRA:[id number]  DOA: 06/30/2020  LOS: 5 days   Chief Complaint  Patient presents with  . Hematuria    Brief narrative: Shannon [name] is a 75 y.o. 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  Then he had a fall at home developed epidural hematoma, underwent reexploration, removal of hardware, evacuation of hematoma on 10/30, now quadriplegic with dysphagia, unstageable sacral decubitus wounds, failure to thrive-with numerous recent hospitalizations for aspiration pneumonia/hematuria/UTI.   Patient was brought to the ED from SNF for evaluation of recurrent hematuria, cough.  In the ED, he briefly required up to 10 L of oxygen. Chest x-ray showed dense opacity at the right lung base. Urinalysis showed turbid brown urine. Patient was admitted for acute hypoxic respiratory due to aspiration pneumonia and complicated UTI. Urine culture sent on admission grew multidrug-resistant Achromobacter xylosoxidans  During the course of aspiration, patient was found to have significant dysphagia, worse compared to prior.  Cortrak feeding was started.  Planned for PEG tube placement today.  Subjective: Patient was seen and examined this morning. Lying on bed.  Not in distress no new symptoms.  Has ongoing cortrak feeding.  Plan for PICC placement today. Labs from this morning showed persistent hypokalemia, hypomagnesemia and hypernatremia. Both upper extremities look swollen to me today.  Assessment/Plan: SIRS due to complicated UTI and aspiration pneumonia:  -Patient has chronic dysphagia, chronic aspiration tendency.  Chest x-ray showed opacity in the right lung base.   -Patient was afebrile but developed mild leukocytosis. -WBC count have improved over time.   -Currently on broad-spectrum antibiotic  coverage with meropenem and vancomycin.  Continue the same for 7-day course Recent Labs  Lab 06/30/20 1006 07/01/20 0617 07/02/20 0104 07/03/20 0849 07/04/20 0423 07/05/20 0229  WBC 9.7 13.1* 13.0* 11.9* 9.0 8.2  LATICACIDVEN 1.3  --   --   --   --   --   PROCALCITON <0.10  --   --   --   --   --    Acute hypoxic respiratory failure due to aspiration pneumonia:  -Briefly required up to 10 L of oxygen initially.  Later titrated down.   -Has had prior palliative care evaluations in the past-remains a full code.    Chronic dysphagia Severe protein calorie malnutrition -PEG tube planned.  Continue Cortrak feeding in the meantime -Nutrition consult appreciated.  Chronic aspiration -Due to chronic dysphagia. -CT chest from 1/11 showed complete opacification of the both lower lobes and subtotal opacification of right middle lobe.  There are layering debris's in the left mainstem and lower lobe bronchi.  These findings are suspicious for recurrent aspiration.  -Procalcitonin less than 0.1 on admission.  I think this findings are mostly chronic because of chronic aspiration.  UTI associated with chronic Foley catheter -Urinalysis on admission showed turbid brown urine.  Urine culture grew more than 1000 CFU per mL of multidrug-resistant Achromobacter xylosoxidans. -Continue meropenem for now.  Hematuria secondary to complicated UTI Foley trauma-due to indwelling Foley catheter:  -Hematuria resolved.  Urine is clear now.  Chronic sacral decubitus ulcer stage IV - POA -Surgery consult obtained.  1/10, patient had bedside debridement.  He had necrotic scar covering the wound.  PT to do hydrotherapy.  Continue dressing changes with Santyl.   History of COVID-19 infection:  -Positive Covid PCR on admission  is a sequelae of prior infection-doubt he requires any further treatment or airborne isolation for this issue.  Continue to observe-his hypoxia is due to aspiration pneumonia.     Quadriplegia with dysphagia-cervical myelopathy-s/p C-spine surgery on 10/13-subsequently sustained a fall at home and developed epidural hematoma-underwent reexploration/removal of hardware and evacuation of hematoma on 10/30.  Per prior documentation-plan is to leave cervical collar for 3 months total-till mid January.  Continues to have significant quadriplegia (able to move upper extremity side-by-side-unable to lift off the bed against gravity, able to only wiggle toes-unable to lift lower extremities off the bed against gravity)  Hypotension: BP remains stable on low normal range on midodrine.  I do not see any other blood pressure medicines in the list  Hypernatremia -Sodium level trending up, 149 this morning.   -Continue D5 half NS.  Increase rate to 100 mL/h.  Continue free water through NG tube. Recent Labs  Lab 06/30/20 1006 07/01/20 0617 07/02/20 0104 07/03/20 0849 07/04/20 0423 07/05/20 0229  NA 140 141 142 146* 148* 149*   Hypokalemia/hypomagnesemia/hypophosphatemia -Electrolyte levels were low yesterday.  Aggressive replacement given.  Continue to remain low today. Expect to improve with improvement in nutritional status.   -Currently patient is on scheduled potassium chloride and potassium phosphate tablet twice daily.  IV phosphorus and IV magnesium replacement given today. Recent Labs  Lab 07/01/20 0617 07/02/20 0104 07/03/20 0849 07/04/20 0423 07/05/20 0229  K 3.3* 3.4* 3.2* 3.1* 2.8*  MG  --   --   --  1.6* 2.3  PHOS  --   --   --  1.5* 1.2*   Swelling of both upper extremities -Multifactorial: Chronic immobility, low albumin level, compression bandages on arm and forearm.  Keep extremities elevated.  Multifactorial anemia-anemia of chronic disease/vitamin B12 deficiency/borderline iron deficiency/acute blood loss due to hematuria:  -Hemoglobin at baseline-continue iron supplementation Recent Labs    05/22/20 1304 05/23/20 0432 06/07/20 0032  06/07/20 0739 07/01/20 0617 07/02/20 0104 07/03/20 0849 07/04/20 0423 07/05/20 0229  HGB  --    < >  --    < > 9.8* 9.1* 8.8* 9.1* 9.3*  MCV  --    < >  --    < > 94.7 93.6 95.1 94.3 96.0  VITAMINB12 160*  --   --   --   --  456  --   --   --   FOLATE 5.9*  --   --   --   --   --   --   --   --   FERRITIN 89  --  907*  --   --   --   --   --   --   TIBC 150*  --   --   --   --   --   --   --   --   IRON 25*  --   --   --   --   --   --   --   --   RETICCTPCT 3.9*  --   --   --   --   --   --   --   --    < > = values in this interval not displayed.   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  Palliative care/goals of care: Has had numerous palliative care evaluations in the past-remains a full code-this MD reached out to patient's niece  who asked me to call patient's daughter-she will assessment who I spoke to this morning.  Explained poor prognosis-potential for life-threatening pneumonia/respiratory failure-however she remains resistant to palliative care-and states that she believes in God and that "God is the healer" and "you are  just a vessel"   Patient's overall prognosis remains poor-he is at significant risk for repeated hospitalizations due to recurrent aspirations pneumonia/UTI/sacral decubitus ulcer infection-which potentially could be life disabling or life-threatening.   Mobility: Quadriplegia at baseline Code Status:   Code Status: Full Code  Nutritional status: Body mass index is 31.69 kg/m. Nutrition Problem: Severe Malnutrition Etiology: chronic illness (chronic wounds) Signs/Symptoms: energy intake < or equal to 75% for > or equal to 1 month,severe fat depletion,severe muscle depletion Diet Order            Diet NPO time specified Except for: Sips with Meds  Diet effective midnight                 DVT prophylaxis: SCDs Start: 06/30/20 1310   Antimicrobials:  IV  meropenem, IV vancomycin. I will continue the same for now for at least a 7-day course.   Fluid: D5 half NS at 100 mL/h Consultants: IR, general surgery Family Communication:  Discussed with daughter on the phone  Status is: Inpatient  Remains inpatient appropriate because: Ongoing sepsis work-up, needs broad-spectrum antibiotics   Dispo: The patient is from: SNF               Anticipated d/c is to: SNF              Anticipated d/c date is: > 3 days              Patient currently is not medically stable to d/c.  Infusions:  . dextrose 5 % and 0.45% NaCl 100 mL/hr at 07/05/20 0802  . feeding supplement (OSMOLITE 1.5 CAL) 40 mL/hr at 07/04/20 1647  . meropenem (MERREM) IV 2 g (07/05/20 0523)  . potassium PHOSPHATE IVPB (in mmol) 30 mmol (07/05/20 0941)  . sodium chloride irrigation    . vancomycin 1,500 mg (07/05/20 21300808)    Scheduled Meds: . acetaminophen  1,000 mg Per Tube TID  . ascorbic acid  1,000 mg Per Tube Daily  . Chlorhexidine Gluconate Cloth  6 each Topical Daily  . collagenase   Topical Daily  . escitalopram  2.5 mg Per Tube QHS  . feeding supplement (PROSource TF)  45 mL Per Tube TID  . ferrous sulfate  325 mg Per Tube BID WC  . free water  200 mL Per Tube Q6H  . gabapentin  200 mg Per Tube BID  . mouth rinse  15 mL Mouth Rinse BID  . midodrine  10 mg Per Tube BID WC  . nutrition supplement (JUVEN)  1 packet Per Tube BID  . pantoprazole sodium  40 mg Per Tube QHS  . phosphorus  500 mg Per NG tube BID  . polyethylene glycol  17 g Per Tube q morning - 10a  . potassium chloride  40 mEq Per Tube BID  . scopolamine  1 patch Transdermal Q72H  . sodium chloride flush  3 mL Intravenous Q12H  . tamsulosin  0.4 mg Oral Daily    Antimicrobials: Anti-infectives (From admission, onward)   Start     Dose/Rate Route Frequency Ordered Stop   07/04/20 1600  vancomycin (VANCOREADY) IVPB 1500 mg/300 mL  Status:  Discontinued        1,500 mg 150  mL/hr over 120 Minutes  Intravenous Every 24 hours 07/03/20 0904 07/04/20 0900   07/04/20 1000  vancomycin (VANCOREADY) IVPB 1500 mg/300 mL        1,500 mg 150 mL/hr over 120 Minutes Intravenous Every 24 hours 07/04/20 0900     07/01/20 0900  vancomycin (VANCOREADY) IVPB 1500 mg/300 mL  Status:  Discontinued        1,500 mg 150 mL/hr over 120 Minutes Intravenous Every 12 hours 07/01/20 0843 07/03/20 0904   06/30/20 2000  meropenem (MERREM) 2 g in sodium chloride 0.9 % 100 mL IVPB        2 g 200 mL/hr over 30 Minutes Intravenous Every 8 hours 06/30/20 1303     06/30/20 1115  meropenem (MERREM) 2 g in sodium chloride 0.9 % 100 mL IVPB        2 g 200 mL/hr over 30 Minutes Intravenous  Once 06/30/20 1036 06/30/20 1241      PRN meds: acetaminophen, albuterol, ondansetron **OR** ondansetron (ZOFRAN) IV, oxyCODONE, silver nitrate applicators   Objective: Vitals:   07/05/20 0303 07/05/20 0746  BP: 92/64 106/64  Pulse: 73 81  Resp: 16 17  Temp: 98.7 F (37.1 C) 98.7 F (37.1 C)  SpO2: 99% 100%    Intake/Output Summary (Last 24 hours) at 07/05/2020 0945 Last data filed at 07/05/2020 0524 Gross per 24 hour  Intake 2360.4 ml  Output 1000 ml  Net 1360.4 ml   Filed Weights   06/30/20 1143  Weight: 106 kg   Weight change:  Body mass index is 31.69 kg/m.   Physical Exam: General exam: Elderly Caucasian male.  Not in physical distress Skin: No rashes, lesions or ulcers. HEENT: Has cervical collar in place Lungs: Clear to auscultation bilaterally CVS: Regular rate and rhythm, normal GI/Abd soft, nontender, nondistended, bowel sound present CNS: Alert, awake.  Quadriplegia at baseline Sacral wound: Stage IV sacral wound at the back Psychiatry: Mood appropriate Extremities: Both upper extremities are swollen, especially in the hands.  Data Review: I have personally reviewed the laboratory data and studies available.  Recent Labs  Lab 06/30/20 1006 07/01/20 0617 07/02/20 0104 07/03/20 0849  07/04/20 0423 07/05/20 0229  WBC 9.7 13.1* 13.0* 11.9* 9.0 8.2  NEUTROABS 7.4  --   --  10.1* 6.8 6.1  HGB 9.9* 9.8* 9.1* 8.8* 9.1* 9.3*  HCT 33.0* 32.3* 29.3* 29.3* 29.7* 31.3*  MCV 95.9 94.7 93.6 95.1 94.3 96.0  PLT 258 235 206 216 240 230   Recent Labs  Lab 07/01/20 0617 07/02/20 0104 07/03/20 0849 07/04/20 0423 07/05/20 0229  NA 141 142 146* 148* 149*  K 3.3* 3.4* 3.2* 3.1* 2.8*  CL 100 105 110 111 111  CO2 27 24 25 27 30   GLUCOSE 106* 83 79 118* 164*  BUN 9 9 6* 16 23  CREATININE 0.52* 0.61 0.65 0.63 0.39*  CALCIUM 7.9* 7.8* 8.1* 8.3* 8.1*  MG  --   --   --  1.6* 2.3  PHOS  --   --   --  1.5* 1.2*    F/u labs ordered  Signed, , MD [facility] 07/05/2020

## 2020-07-05 NOTE — Progress Notes (Signed)
Physical Therapy Wound Treatment Patient Details  Name: Shannon [name] MRN: [id number] Date of Birth: [birth year]  Today's Date: 07/05/2020 Time: 3220-2542 Time Calculation (min): 42 min  Subjective  Subjective: More alert today Patient and Family Stated Goals: did not state Date of Onset:  (unknown) Prior Treatments: Dressing change, hydrotherapy prior admission 05/2020, s/p bedside debridement 07/04/2019  Pain Score:  8/10 (premedicated)  Wound Assessment  Pressure Injury 05/23/20 Sacrum Unstageable - Full thickness tissue loss in which the base of the injury is covered by slough (yellow, tan, gray, green or brown) and/or eschar (tan, brown or black) in the wound bed. (Active)  Dressing Type ABD;Gauze (Comment);Paper tape 06/26/20 2040  Dressing Clean;Dry;Intact 06/26/20 2040  Dressing Change Frequency PRN 06/25/20 1319  State of Healing Other (Comment) 06/25/20 1319  Site / Wound Assessment Dressing in place / Unable to assess 06/20/20 2030  % Wound base Red or Granulating 50% 06/09/20 1424  % Wound base Yellow/Fibrinous Exudate 30% 06/09/20 1424  % Wound base Black/Eschar 10% 06/09/20 1424  % Wound base Other/Granulation Tissue (Comment) 10% 06/09/20 1424  Peri-wound Assessment Intact 06/12/20 0613  Wound Length (cm) 12 cm 06/21/20 1000  Wound Width (cm) 6 cm 06/21/20 1000  Wound Depth (cm) 0 cm 06/21/20 1000  Wound Surface Area (cm^2) 72 cm^2 06/21/20 1000  Wound Volume (cm^3) 0 cm^3 06/21/20 1000  Margins Unattached edges (unapproximated) 06/25/20 1319  Drainage Amount None 06/26/20 2040  Drainage Description Serosanguineous 06/25/20 1319  Treatment Cleansed;Packing (Saline gauze) 06/25/20 1319   Santyl applied to wound bed prior to applying dressing.    Hydrotherapy Pulsed lavage therapy - wound location: sacrum Pulsed Lavage with Suction (psi): 8 psi Pulsed Lavage with Suction - Normal Saline Used: 1000 mL Pulsed Lavage Tip: Tip with splash shield Selective  Debridement Selective Debridement - Location: Sacrum Selective Debridement - Tools Used: Forceps;Scissors Selective Debridement - Tissue Removed: slough   Wound Assessment and Plan  Wound Therapy - Assess/Plan/Recommendations Wound Therapy - Clinical Statement: Pt more alert today with increased pain during pulsatile lavage and dressing change, despite premedication. Pt will benefit from hydrotherapy for selective debridement and to decrease bioburden. Wound Therapy - Functional Problem List: Decreased mobility and global weakness Factors Delaying/Impairing Wound Healing: Immobility Hydrotherapy Plan: Debridement;Dressing change;Patient/family education;Pulsatile lavage with suction Wound Therapy - Frequency: 6X / week Wound Therapy - Follow Up Recommendations: Skilled nursing facility Wound Plan: See above  Wound Therapy Goals- Improve the function of patient's integumentary system by progressing the wound(s) through the phases of wound healing (inflammation - proliferation - remodeling) by: Decrease Necrotic Tissue to: 50 Decrease Necrotic Tissue - Progress: Progressing toward goal Increase Granulation Tissue to: 50 Increase Granulation Tissue - Progress: Progressing toward goal Goals/treatment plan/discharge plan were made with and agreed upon by patient/family: Yes Time For Goal Achievement: 7 days Wound Therapy - Potential for Goals: Fair  Goals will be updated until maximal potential achieved or discharge criteria met.  Discharge criteria: when goals achieved, discharge from hospital, MD decision/surgical intervention, no progress towards goals, refusal/missing three consecutive treatments without notification or medical reason.  GP    Wyona Almas, PT, DPT Acute Rehabilitation Services Pager [phone] Office [phone]  Deno Etienne 07/05/2020, 9:28 AM

## 2020-07-05 NOTE — Plan of Care (Signed)

## 2020-07-05 NOTE — Procedures (Addendum)
Interventional Radiology Procedure Note  Procedure: Placement of percutaneous 65F balloon retention gastrostomy tube.  Complications: None  Recommendations: - NPO except for sips and chips remainder of today and overnight - Maintain G-tube to LWS until tomorrow morning  - May advance diet as tolerated and begin using tube tomorrow morning - IR to arrange for tube check and suture anchor release in 2 weeks  Marliss Coots, MD Pager: [phone]

## 2020-07-05 NOTE — Progress Notes (Signed)
Pt is currently NPO for a G-tube placement. Flexiseal rectal tube inserted d/t frequent diarrhea, pt also has a stage 4 sacral ulcer.

## 2020-07-05 NOTE — Progress Notes (Signed)
Day shift RN stated that pharmacy said Flomax is contraindicated due to having to be crushed.

## 2020-07-06 DIAGNOSIS — J690 Pneumonitis due to inhalation of food and vomit: Secondary | ICD-10-CM | POA: Diagnosis not present

## 2020-07-06 DIAGNOSIS — J9601 Acute respiratory failure with hypoxia: Secondary | ICD-10-CM | POA: Diagnosis not present

## 2020-07-06 LAB — CBC WITH DIFFERENTIAL/PLATELET
Abs Immature Granulocytes: 0.07 10*3/uL (ref 0.00–0.07)
Basophils Absolute: 0 10*3/uL (ref 0.0–0.1)
Basophils Relative: 1 %
Eosinophils Absolute: 0.2 10*3/uL (ref 0.0–0.5)
Eosinophils Relative: 3 %
HCT: 28.4 % — ABNORMAL LOW (ref 39.0–52.0)
Hemoglobin: 8.8 g/dL — ABNORMAL LOW (ref 13.0–17.0)
Immature Granulocytes: 1 %
Lymphocytes Relative: 16 %
Lymphs Abs: 1.2 10*3/uL (ref 0.7–4.0)
MCH: 28.9 pg (ref 26.0–34.0)
MCHC: 31 g/dL (ref 30.0–36.0)
MCV: 93.4 fL (ref 80.0–100.0)
Monocytes Absolute: 0.6 10*3/uL (ref 0.1–1.0)
Monocytes Relative: 8 %
Neutro Abs: 5.6 10*3/uL (ref 1.7–7.7)
Neutrophils Relative %: 71 %
Platelets: 190 10*3/uL (ref 150–400)
RBC: 3.04 MIL/uL — ABNORMAL LOW (ref 4.22–5.81)
RDW: 14.5 % (ref 11.5–15.5)
WBC: 7.8 10*3/uL (ref 4.0–10.5)
nRBC: 0 % (ref 0.0–0.2)

## 2020-07-06 LAB — BASIC METABOLIC PANEL
Anion gap: 7 (ref 5–15)
BUN: 14 mg/dL (ref 8–23)
CO2: 31 mmol/L (ref 22–32)
Calcium: 7.9 mg/dL — ABNORMAL LOW (ref 8.9–10.3)
Chloride: 108 mmol/L (ref 98–111)
Creatinine, Ser: 0.34 mg/dL — ABNORMAL LOW (ref 0.61–1.24)
GFR, Estimated: >60 mL/min (ref >60)
Glucose, Bld: 121 mg/dL — ABNORMAL HIGH (ref 70–99)
Potassium: 2.9 mmol/L — ABNORMAL LOW (ref 3.5–5.1)
Sodium: 146 mmol/L — ABNORMAL HIGH (ref 135–145)

## 2020-07-06 LAB — PHOSPHORUS: Phosphorus: 2 mg/dL — ABNORMAL LOW (ref 2.5–4.6)

## 2020-07-06 LAB — GLUCOSE, CAPILLARY
Glucose-Capillary: 102 mg/dL — ABNORMAL HIGH (ref 70–99)
Glucose-Capillary: 103 mg/dL — ABNORMAL HIGH (ref 70–99)
Glucose-Capillary: 109 mg/dL — ABNORMAL HIGH (ref 70–99)
Glucose-Capillary: 117 mg/dL — ABNORMAL HIGH (ref 70–99)
Glucose-Capillary: 121 mg/dL — ABNORMAL HIGH (ref 70–99)
Glucose-Capillary: 96 mg/dL (ref 70–99)

## 2020-07-06 MED ORDER — POTASSIUM CHLORIDE 20 MEQ PO PACK
40.0000 meq | PACK | Freq: Three times a day (TID) | ORAL | Status: DC
  Administered 2020-07-06 – 2020-07-08 (×9): 40 meq
  Filled 2020-07-06 (×9): qty 2

## 2020-07-06 MED ORDER — CITALOPRAM HYDROBROMIDE 10 MG PO TABS
5.0000 mg | ORAL_TABLET | Freq: Every day | ORAL | Status: DC
  Administered 2020-07-06 – 2020-07-11 (×6): 5 mg
  Filled 2020-07-06 (×6): qty 1

## 2020-07-06 MED ORDER — POTASSIUM PHOSPHATES 15 MMOLE/5ML IV SOLN
30.0000 mmol | Freq: Once | INTRAVENOUS | Status: AC
  Administered 2020-07-06: 30 mmol via INTRAVENOUS
  Filled 2020-07-06: qty 10

## 2020-07-06 NOTE — Progress Notes (Signed)
PROGRESS NOTE  Shannon [name]  DOB: [birth year]  PCP: Jeoffrey MassedMcGowen, Philip H, MD ZOX:[id number]  DOA: 06/30/2020  LOS: 6 days   Chief Complaint  Patient presents with  . Hematuria    Brief narrative: Shannon OtaRonald Ray [name] is a 75 y.o. 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  Then he had a fall at home developed epidural hematoma, underwent reexploration, removal of hardware, evacuation of hematoma on 10/30, now quadriplegic with dysphagia, unstageable sacral decubitus wounds, failure to thrive-with numerous recent hospitalizations for aspiration pneumonia/hematuria/UTI.   Patient was brought to the ED from SNF for evaluation of recurrent hematuria, cough.  In the ED, he briefly required up to 10 L of oxygen. Chest x-ray showed dense opacity at the right lung base. Urinalysis showed turbid brown urine. Patient was admitted for acute hypoxic respiratory due to aspiration pneumonia and complicated UTI. Urine culture sent on admission grew multidrug-resistant Achromobacter xylosoxidans  During the course of aspiration, patient was found to have significant dysphagia, worse compared to prior.  Cortrak feeding was started.  Planned for PEG tube placement today.  Subjective: Patient was seen and examined this morning. Lying down on bed.  Not in distress no new symptoms.  Has PEG tube feeding from this morning.  Labs continue to show for hypophosphatemia and hypomagnesemia.   Assessment/Plan: SIRS due to complicated UTI and aspiration pneumonia:  -Patient has chronic dysphagia, chronic aspiration tendency.   -Chest x-ray showed opacity in the right lung base.   -Patient was afebrile but developed mild leukocytosis. -WBC count have improved over time.   -Currently on broad-spectrum antibiotic coverage with meropenem and vancomycin. Patient to complete the 7-day course of  broad-spectrum antibiotics today.  Recent Labs  Lab 06/30/20 1006 07/01/20 0617 07/02/20 0104 07/03/20 0849 07/04/20 0423 07/05/20 0229 07/06/20 0338  WBC 9.7   < > 13.0* 11.9* 9.0 8.2 7.8  LATICACIDVEN 1.3  --   --   --   --   --   --   PROCALCITON <0.10  --   --   --   --   --   --    < > = values in this interval not displayed.   Acute hypoxic respiratory failure due to aspiration pneumonia:  -Briefly required up to 10 L of oxygen initially.  Later titrated down. Currently on 2 L -Has had prior palliative care evaluations in the past-remains a full code.    Chronic dysphagia Severe protein calorie malnutrition -PEG tube placed on 1/12. Per IR, will need T tack suture removal in 2 weeks -Dietary consult appreciated.  Refeeding syndrome Hypomagnesemia/hypophosphatemia -Labs this morning with significantly low potassium and phosphorus level. Oral and IV replacement ordered. -Repeat potassium magnesium and phosphorus level tomorrow. Recent Labs  Lab 07/02/20 0104 07/03/20 0849 07/04/20 0423 07/05/20 0229 07/06/20 0338  K 3.4* 3.2* 3.1* 2.8* 2.9*  MG  --   --  1.6* 2.3  --   PHOS  --   --  1.5* 1.2* 2.0*   Chronic aspiration -Due to chronic dysphagia. -CT chest from 1/11 showed complete opacification of the both lower lobes and subtotal opacification of right middle lobe.  There are layering debris's in the left mainstem and lower lobe bronchi.  These findings are suspicious for recurrent aspiration.  -Procalcitonin less than 0.1 on admission.  I think this findings are mostly chronic because of chronic aspiration.  UTI associated  with chronic Foley catheter -Urinalysis on admission showed turbid brown urine.  Urine culture grew more than 1000 CFU per mL of multidrug-resistant Achromobacter xylosoxidans. -To complete 7-day course of IV meropenem today.  Hematuria secondary to complicated UTI Foley trauma-due to indwelling Foley catheter:  -Hematuria resolved.  Urine  is clear now.  Chronic sacral decubitus ulcer stage IV - POA -Surgery consult obtained.  1/10, patient had bedside debridement.  He had necrotic scar covering the wound.  PT to do hydrotherapy.  Continue dressing changes with Santyl.   History of COVID-19 infection:  -Positive Covid PCR on admission is a sequelae of prior infection-doubt he requires any further treatment or airborne isolation for this issue.  Continue to observe-his hypoxia is due to aspiration pneumonia.    Quadriplegia with dysphagia-cervical myelopathy-s/p C-spine surgery on 10/13-subsequently sustained a fall at home and developed epidural hematoma-underwent reexploration/removal of hardware and evacuation of hematoma on 10/30.  Per prior documentation-plan is to leave cervical collar for 3 months total-till mid January.  Continues to have significant quadriplegia (able to move upper extremity side-by-side-unable to lift off the bed against gravity, able to only wiggle toes-unable to lift lower extremities off the bed against gravity)  Hypotension: BP remains stable on low normal range on midodrine.  I do not see any other blood pressure medicines in the list  Hypernatremia -Sodium level improving now with D5 half NS and free water through PEG tube. -Sodium level 146 this morning. Continue D5 NS at a reduced rate of 50 mill per hour Recent Labs  Lab 06/30/20 1006 07/01/20 0617 07/02/20 0104 07/03/20 0849 07/04/20 0423 07/05/20 0229 07/06/20 0338  NA 140 141 142 146* 148* 149* 146*   Swelling of both upper extremities -Multifactorial: Chronic immobility, low albumin level, compression bandages on arm and forearm.  Keep extremities elevated.  Multifactorial anemia-anemia of chronic disease/vitamin B12 deficiency/borderline iron deficiency/acute blood loss due to hematuria:  -Hemoglobin at baseline-continue iron supplementation Recent Labs    05/22/20 1304 05/23/20 0432 06/07/20 0032 06/07/20 0739  07/02/20 0104 07/03/20 0849 07/04/20 0423 07/05/20 0229 07/06/20 0338  HGB  --    < >  --    < > 9.1* 8.8* 9.1* 9.3* 8.8*  MCV  --    < >  --    < > 93.6 95.1 94.3 96.0 93.4  VITAMINB12 160*  --   --   --  456  --   --   --   --   FOLATE 5.9*  --   --   --   --   --   --   --   --   FERRITIN 89  --  907*  --   --   --   --   --   --   TIBC 150*  --   --   --   --   --   --   --   --   IRON 25*  --   --   --   --   --   --   --   --   RETICCTPCT 3.9*  --   --   --   --   --   --   --   --    < > = values in this interval not displayed.   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  Mobility: Quadriplegia at baseline Code Status:   Code  Status: Full Code  Nutritional status: Body mass index is 31.69 kg/m. Nutrition Problem: Severe Malnutrition Etiology: chronic illness (chronic wounds) Signs/Symptoms: energy intake < or equal to 75% for > or equal to 1 month,severe fat depletion,severe muscle depletion Diet Order            Diet NPO time specified Except for: Sips with Meds  Diet effective midnight                 DVT prophylaxis: enoxaparin (LOVENOX) injection 40 mg Start: 07/06/20 1200 SCDs Start: 06/30/20 1310   Antimicrobials:  To complete the course of broad-spectrum antibiotics today. Fluid: D5 half NS at 50 mL/h Consultants: IR, general surgery Family Communication:  Discussed with daughter on the phone  Status is: Inpatient  Remains inpatient appropriate because: Ongoing sepsis work-up, needs broad-spectrum antibiotics   Dispo: The patient is from: SNF               Anticipated d/c is to: SNF              Anticipated d/c date is: > 3 days              Patient currently is not medically stable to d/c.  Infusions:  . dextrose 5 % and 0.45% NaCl 100 mL/hr at 07/06/20 1208  . feeding supplement (OSMOLITE 1.5 CAL) 1,000 mL (07/06/20 0938)  . meropenem (MERREM) IV 2 g  (07/06/20 1214)  . potassium PHOSPHATE IVPB (in mmol) 30 mmol (07/06/20 1256)  . sodium chloride irrigation      Scheduled Meds: . acetaminophen  1,000 mg Per Tube TID  . ascorbic acid  1,000 mg Per Tube Daily  . Chlorhexidine Gluconate Cloth  6 each Topical Daily  . citalopram  5 mg Per Tube Daily  . collagenase   Topical Daily  . enoxaparin (LOVENOX) injection  40 mg Subcutaneous Q24H  . feeding supplement (PROSource TF)  45 mL Per Tube TID  . ferrous sulfate  300 mg Per Tube BID WC  . free water  200 mL Per Tube Q6H  . gabapentin  200 mg Per Tube BID  . mouth rinse  15 mL Mouth Rinse BID  . midodrine  10 mg Per Tube BID WC  . nutrition supplement (JUVEN)  1 packet Per Tube BID  . pantoprazole sodium  40 mg Per Tube QHS  . phosphorus  500 mg Per NG tube BID  . polyethylene glycol  17 g Per Tube q morning - 10a  . potassium chloride  40 mEq Per Tube TID AC & HS  . scopolamine  1 patch Transdermal Q72H  . sodium chloride flush  3 mL Intravenous Q12H  . tamsulosin  0.4 mg Oral Daily    Antimicrobials: Anti-infectives (From admission, onward)   Start     Dose/Rate Route Frequency Ordered Stop   07/04/20 1600  vancomycin (VANCOREADY) IVPB 1500 mg/300 mL  Status:  Discontinued        1,500 mg 150 mL/hr over 120 Minutes Intravenous Every 24 hours 07/03/20 0904 07/04/20 0900   07/04/20 1000  vancomycin (VANCOREADY) IVPB 1500 mg/300 mL  Status:  Discontinued        1,500 mg 150 mL/hr over 120 Minutes Intravenous Every 24 hours 07/04/20 0900 07/05/20 1054   07/01/20 0900  vancomycin (VANCOREADY) IVPB 1500 mg/300 mL  Status:  Discontinued        1,500 mg 150 mL/hr over 120 Minutes Intravenous Every 12 hours 07/01/20 0843 07/03/20 0904  06/30/20 2000  meropenem (MERREM) 2 g in sodium chloride 0.9 % 100 mL IVPB        2 g 200 mL/hr over 30 Minutes Intravenous Every 8 hours 06/30/20 1303 07/06/20 2359   06/30/20 1115  meropenem (MERREM) 2 g in sodium chloride 0.9 % 100 mL IVPB         2 g 200 mL/hr over 30 Minutes Intravenous  Once 06/30/20 1036 06/30/20 1241      PRN meds: acetaminophen, albuterol, ondansetron **OR** ondansetron (ZOFRAN) IV, oxyCODONE, silver nitrate applicators   Objective: Vitals:   07/06/20 0807 07/06/20 1200  BP: 119/76 105/72  Pulse: 79 65  Resp: 11 16  Temp: 97.7 F (36.5 C)   SpO2: 100% 100%    Intake/Output Summary (Last 24 hours) at 07/06/2020 1341 Last data filed at 07/05/2020 2319 Gross per 24 hour  Intake 463.42 ml  Output 750 ml  Net -286.58 ml   Filed Weights   06/30/20 1143  Weight: 106 kg   Weight change:  Body mass index is 31.69 kg/m.   Physical Exam: General exam: Elderly Caucasian male. Not in physical distress Skin: No rashes, lesions or ulcers. HEENT: Has cervical collar in place Lungs: Clear to auscultation bilaterally CVS: Regular rate and rhythm, normal GI/Abd soft, nontender, nondistended, bowel sound present. PEG tube site intact. CNS: Alert, awake. Quadriplegia at baseline Sacral wound: Stage IV sacral wound at the back Psychiatry: Mood appropriate Extremities: Both upper extremities are swollen, especially in the hands.  Data Review: I have personally reviewed the laboratory data and studies available.  Recent Labs  Lab 06/30/20 1006 07/01/20 0617 07/02/20 0104 07/03/20 0849 07/04/20 0423 07/05/20 0229 07/06/20 0338  WBC 9.7   < > 13.0* 11.9* 9.0 8.2 7.8  NEUTROABS 7.4  --   --  10.1* 6.8 6.1 5.6  HGB 9.9*   < > 9.1* 8.8* 9.1* 9.3* 8.8*  HCT 33.0*   < > 29.3* 29.3* 29.7* 31.3* 28.4*  MCV 95.9   < > 93.6 95.1 94.3 96.0 93.4  PLT 258   < > 206 216 240 230 190   < > = values in this interval not displayed.   Recent Labs  Lab 07/02/20 0104 07/03/20 0849 07/04/20 0423 07/05/20 0229 07/06/20 0338  NA 142 146* 148* 149* 146*  K 3.4* 3.2* 3.1* 2.8* 2.9*  CL 105 110 111 111 108  CO2 24 25 27 30 31   GLUCOSE 83 79 118* 164* 121*  BUN 9 6* 16 23 14   CREATININE 0.61 0.65 0.63 0.39* 0.34*   CALCIUM 7.8* 8.1* 8.3* 8.1* 7.9*  MG  --   --  1.6* 2.3  --   PHOS  --   --  1.5* 1.2* 2.0*    F/u labs ordered  Signed, , MD [facility] 07/06/2020

## 2020-07-06 NOTE — Progress Notes (Signed)
Ok to use equivalent dose of celexa with here due to availability per Dr. Pola Corn.  Ulyses Southward, PharmD, BCIDP, AAHIVP, CPP Infectious Disease Pharmacist 07/06/2020 1:08 PM

## 2020-07-06 NOTE — Progress Notes (Signed)
Referring Physician(s): Dahal,B  Supervising Physician: Ruel FavorsShick, Trevor  [facility]  Chief Complaint:  dysphagia  Subjective: Pt doing ok; denies sig abd pain; G tube intact   Allergies: Iodine and Penicillins  Medications: Prior to Admission medications   Medication Sig Start Date End Date Taking? Authorizing Provider  acetaminophen (TYLENOL) 500 MG tablet Take 1,000 mg by mouth 3 (three) times daily.    Yes [provider]  albuterol (VENTOLIN HFA) 108 (90 Base) MCG/ACT inhaler Inhale 2 puffs into the lungs daily.   Yes [provider]  ascorbic acid (VITAMIN C) 500 MG tablet Take 1,000 mg by mouth daily.   Yes [provider]  aspirin 325 MG tablet Take 325 mg by mouth every morning.   Yes [provider]  docusate sodium (COLACE) 100 MG capsule Take 1 capsule (100 mg total) by mouth 2 (two) times daily. 06/12/20  Yes Leroy SeaSingh, Prashant K, MD  escitalopram (LEXAPRO) 5 MG tablet Take 2.5 mg by mouth at bedtime.   Yes [provider]  ferrous sulfate 325 (65 FE) MG tablet Take 1 tablet (325 mg total) by mouth 2 (two) times daily with a meal. 06/06/20  Yes Zannie CoveJoseph, Preetha, MD  gabapentin (NEURONTIN) 100 MG capsule Take 200 mg by mouth 2 (two) times daily.   Yes [provider]  midodrine (PROAMATINE) 10 MG tablet Take 1 tablet (10 mg total) by mouth 2 (two) times daily with a meal. 06/06/20  Yes Zannie CoveJoseph, Preetha, MD  oxyCODONE (OXY IR/ROXICODONE) 5 MG immediate release tablet Take 2.5 mg by mouth as needed for severe pain.   Yes [provider]  pantoprazole (PROTONIX) 40 MG tablet Take 1 tablet (40 mg total) by mouth at bedtime. 04/19/20  Yes Love, Evlyn KannerPamela S, PA-C  polyethylene glycol (MIRALAX / GLYCOLAX) 17 g packet Take 17 g by mouth every morning. Mix in 6 oz water and drink   Yes [provider]  scopolamine (TRANSDERM-SCOP) 1 MG/3DAYS Place 1 patch onto the skin every 3 (three) days.   Yes  [provider]  tamsulosin (FLOMAX) 0.4 MG CAPS capsule Take 1 capsule (0.4 mg total) by mouth daily. 04/28/20  Yes Tressie StalkerJenkins, Jeffrey, MD  feeding supplement (ENSURE ENLIVE / ENSURE PLUS) LIQD Take 237 mLs by mouth 4 (four) times daily. 06/12/20   Leroy SeaSingh, Prashant K, MD     Vital Signs: BP 119/76 (BP Location: Right Arm)   Pulse 79   Temp 97.7 F (36.5 C) (Axillary)   Resp 11   Ht 6' (1.829 m)   Wt 233 lb 11 oz (106 kg)   SpO2 100%   BMI 31.69 kg/m   Physical Exam awake, answers questions ok; G tube intact, insertion site ok,NT, abd ND; 1 T tack in place  Imaging: CT CHEST WO CONTRAST  Result Date: 07/04/2020 CLINICAL DATA:  Abnormal chest x-ray COVID positive patient with CHF. EXAM: CT CHEST WITHOUT CONTRAST TECHNIQUE: Multidetector CT imaging of the chest was performed following the standard protocol without IV contrast. COMPARISON:  Multiple prior radiographs. Lung bases from abdominal CT 06/19/2020. Chest CT 05/03/2020 FINDINGS: Cardiovascular: Mild fusiform dilatation of the ascending aorta at 4.3 cm. No periaortic stranding. Conventional branching pattern from the aortic arch. Heart is normal in size. There is a small pericardial effusion measures up to 9 mm adjacent to the right ventricle. Aortic valve calcifications. Mediastinum/Nodes: Enteric tube decompresses the esophagus. Calcified lower paratracheal node. No noncalcified adenopathy. No suspicious thyroid nodule. Lungs/Pleura:  Complete opacification of the left lower lobe. There is scattered air bronchograms and parenchymal high-density. Additional bubbles of air within the consolidative lung are not definitively bronchial. Small pleural effusion on the left tracks superior to the consolidation. Layering debris within the left mainstem and lower lobe bronchi. Complete filling of the right bronchus intermedius with complete collapse of the right lower lobe and subtotal collapse of the right middle lobe. Small foci of air  within the consolidated right lower lobe, with occasional high-density foci. Central air bronchograms in the right middle lobe. Aerated portion of the right middle lobe demonstrates patchy airspace disease. 9 mm right upper lobe pulmonary nodule, series 4, image 51, new from prior. 6 mm right upper lobe pulmonary nodule series 4, image 55, also new. Mild additional ground-glass and patchy opacity in the dependent right upper lobe. Small amount of pleural fluid is primarily loculated above the right lung consolidation. No evidence of pulmonary edema. Upper Abdomen: High-density barium obscures assessment. Small amounts of upper abdominal ascites. Musculoskeletal: Spurring and mild flattening of midthoracic vertebra. There are no acute or suspicious osseous abnormalities. IMPRESSION: 1. Complete opacification of both lower lobes and subtotal opacification in the right middle lobe. Right bronchus intermedius is completely occluded with layering debris in the left mainstem and lower lobe bronchi. This has progressed from lung bases of recent abdominal imaging, however right lung findings are similar to November 2021 exam. Findings are suspicious for recurrent aspiration. Air bronchograms in the consolidative lung, additional bubbles of air in the left lower lobe are not definitively bronchial, developing lung abscess is not excluded, but felt unlikely. 2. Nodular and slightly ground-glass opacities in the right upper lobe, new from prior, likely infectious. 3. Small pleural effusions with pleural fluid tracking above the consolidative process. 4. 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 Recommend annual imaging follow-up by CTA or MRA. Electronically Signed   By: Narda Rutherford M.D.   On: 07/04/2020 20:59   IR GASTROSTOMY TUBE MOD SED  Result Date: 07/05/2020 INDICATION: 75 year old male with quadriplegia and dysphagia. EXAM: PERC PLACEMENT GASTROSTOMY MEDICATIONS: None. ANESTHESIA/SEDATION: Versed  0.5 mg IV; Fentanyl 25 mcg IV Moderate Sedation Time:  17 The patient was continuously monitored during the procedure by the interventional radiology nurse under my direct supervision. CONTRAST:  2mL OMNIPAQUE IOHEXOL 300 MG/ML SOLN - administered into the gastric lumen. FLUOROSCOPY TIME:  Fluoroscopy Time: 0 minutes 36 seconds (5 mGy). COMPLICATIONS: None immediate. PROCEDURE: Informed written consent was obtained from the patient after a thorough discussion of the procedural risks, benefits and alternatives. All questions were addressed. Maximal Sterile Barrier Technique was utilized including caps, mask, sterile gowns, sterile gloves, sterile drape, hand hygiene and skin antiseptic. A timeout was performed prior to the initiation of the procedure. The patient was placed on the procedure table in the supine position. Pre-procedure abdominal film confirmed visualization of the transverse colon. The patient was prepped and draped in usual sterile fashion. The stomach was insufflated with air via the indwelling nasogastric tube. Under fluoroscopy, a puncture site was selected and local analgesia achieved with 1% lidocaine infiltrated subcutaneously. Under fluoroscopic guidance, a gastropexy needle was passed into the stomach and the T-bar suture was released. Entry into the stomach was confirmed with fluoroscopy, aspiration of air, and injection of contrast material. This was repeated with an additional gastropexy suture (for a total of 2 fasteners). At the center of these gastropexy sutures, a dermatotomy was performed. An 18 gauge needle was passed into the stomach  atthe site of this dermatotomy, and position within the gastric lumen again confirmed under fluoroscopy using aspiration of air and contrast injection. An Amplatz guidewire was passed through this needle and intraluminal placement within the stomach was confirmed by fluoroscopy. The needle was removed. Over the guidewire, the percutaneous tract was  dilated using a 10 mm non-compliant balloon. The balloon was deflated, then pushed into the gastric lumen followed in concert by the 20 Fr gastrostomy tube. The retention balloon of the percutaneous gastrostomy tube was inflated with 10 mL of sterile water. The tube was withdrawn until the retention balloon was at the edge of the gastric lumen. The external bumper was brought to the abdominal wall. Contrast was injected through the gastrostomy tube, confirming intraluminal positioning. The patient tolerated the procedure well without any immediate post-procedural complications. IMPRESSION: Technically successful placement of 20 Fr gastrostomy tube. PLAN: Return in 2 weeks for gastropexy suture release. Marliss Coots, MD [facility] Electronically Signed   By: Marliss Coots MD   On: 07/05/2020 17:38   DG Chest Port 1 View  Result Date: 07/04/2020 CLINICAL DATA:  CHF EXAM: PORTABLE CHEST 1 VIEW COMPARISON:  07/01/2020. FINDINGS: Feeding tube noted with tip below left hemidiaphragm. Heart size stable. No pulmonary venous congestion. Bibasilar pulmonary infiltrates/edema and bilateral pleural effusions again noted. No significant interim change. No pneumothorax. Degenerative change thoracic spine. Barium noted the colon. IMPRESSION: Bibasilar pulmonary infiltrates/edema and bilateral pleural effusions again noted. No significant interim change. Electronically Signed   By: Maisie Fus  Register   On: 07/04/2020 06:04   IR US CHEST  Result Date: 07/04/2020 CLINICAL DATA:  Suspected pleural effusion EXAM: CHEST ULTRASOUND COMPARISON:  Chest x-ray 07/01/2020 FINDINGS: Ultrasound evaluation of the chest demonstrates trace pleural fluid, insufficient to attempt thoracentesis. IMPRESSION: Trace pleural fluid, insufficient for thoracentesis. Electronically Signed   By: Malachy Moan M.D.   On: 07/04/2020 09:37    Labs:  CBC: Recent Labs    07/03/20 0849  07/04/20 0423 07/05/20 0229 07/06/20 0338  WBC 11.9* 9.0 8.2 7.8  HGB 8.8* 9.1* 9.3* 8.8*  HCT 29.3* 29.7* 31.3* 28.4*  PLT 216 240 230 190    COAGS: Recent Labs    05/03/20 1243 05/21/20 1533 05/22/20 0507 06/19/20 1312 06/20/20 0348 06/30/20 1005  INR 1.7* 1.5* 1.5* 1.3* 1.4* 1.2  APTT 32 31  --   --   --  35    BMP: Recent Labs    01/09/20 1317 04/04/20 1037 07/03/20 0849 07/04/20 0423 07/05/20 0229 07/06/20 0338  NA 140   < > 146* 148* 149* 146*  K 3.8   < > 3.2* 3.1* 2.8* 2.9*  CL 106   < > 110 111 111 108  CO2 25   < > 25 27 30 31   GLUCOSE 100*   < > 79 118* 164* 121*  BUN 12   < > 6* 16 23 14   CALCIUM 8.6*   < > 8.1* 8.3* 8.1* 7.9*  CREATININE 1.10   < > 0.65 0.63 0.39* 0.34*  GFRNONAA >60   < > >60 >60 >60 >60  GFRAA >60  --   --   --   --   --    < > = values in this interval not displayed.    LIVER FUNCTION TESTS: Recent Labs    06/25/20 0304 06/30/20 1006 07/02/20 0104 07/03/20 0849  BILITOT 0.4 0.9 1.4* 1.1  AST 19 20 15 16   ALT 12 10  9 8  ALKPHOS 81 98 84 83  PROT 5.1* 6.2* 5.5* 5.6*  ALBUMIN 1.6* 1.7* 1.3* 1.3*    Assessment and Plan: Pt with hx quadriplegia/dysphagia,PCM; s/p G tube placement 1/12; afebrile; WBC nl; hgb 8.8(9.3), creat 0.34; ok to use tube as needed; will need T tack suture removal in 2 weeks   Electronically Signed: D. Jeananne Rama, PA-C 07/06/2020, 11:59 AM   I spent a total of 15 minutes at the the patient's bedside AND on the patient's hospital floor or unit, greater than 50% of which was counseling/coordinating care for gastrostomy tube   Patient ID: Shannon [name], male   DOB: 09-Feb-1946, 75 y.o.   MRN: [id number]

## 2020-07-06 NOTE — Progress Notes (Signed)
Physical Therapy Wound Treatment Patient Details  Name: Exavier Lina MRN: [id number] Date of Birth: 1945/10/18  Today's Date: 07/06/2020 Time: 1005-1053 Time Calculation (min): 48 min  Subjective  Subjective: More alert today Patient and Family Stated Goals: did not state Date of Onset:  (unknown) Prior Treatments: Dressing change, hydrotherapy prior admission 05/2020, s/p bedside debridement 07/04/2019  Pain Score:    Wound Assessment  Pressure Injury 06/22/20 Sacrum Posterior Stage 4 - Full thickness tissue loss with exposed bone, tendon or muscle. PHYSICAL THERAPY/HYDROTHERAPY (Active)  Wound Image   07/04/20 1355  Dressing Type Barrier Film (skin prep);ABD;Gauze (Comment);Moist to dry 07/06/20 1106  Dressing Changed;Clean 07/06/20 1106  Dressing Change Frequency Daily 07/06/20 1106  State of Healing Eschar 07/06/20 1106  Site / Wound Assessment Bleeding;Brown;Red;Yellow 07/06/20 1106  % Wound base Red or Granulating 30% 07/06/20 1106  % Wound base Yellow/Fibrinous Exudate 20% 07/06/20 1106  % Wound base Black/Eschar 50% 07/06/20 1106  % Wound base Other/Granulation Tissue (Comment) 0% 07/06/20 1106  Peri-wound Assessment Erythema (non-blanchable);Pink 07/05/20 0924  Wound Length (cm) 11 cm 07/04/20 1355  Wound Width (cm) 11 cm 07/04/20 1355  Wound Depth (cm) 2 cm 07/04/20 1355  Wound Surface Area (cm^2) 121 cm^2 07/04/20 1355  Wound Volume (cm^3) 242 cm^3 07/04/20 1355  Undermining (cm) 3 07/04/20 1355  Margins Unattached edges (unapproximated) 07/06/20 1106  Drainage Amount Moderate 07/06/20 1106  Drainage Description Serous;Serosanguineous 07/06/20 1106  Treatment Cleansed;Debridement (Selective);Hydrotherapy (Pulse lavage);Packing (Saline gauze);Other (Comment) 07/06/20 1106   Santyl applied to wound bed prior to applying dressing.    Hydrotherapy Pulsed lavage therapy - wound location: sacrum Pulsed Lavage with Suction (psi): 8 psi Pulsed Lavage with Suction -  Normal Saline Used: 1000 mL Pulsed Lavage Tip: Tip with splash shield Selective Debridement Selective Debridement - Location: Sacrum Selective Debridement - Tools Used: Forceps;Scissors Selective Debridement - Tissue Removed: slough   Wound Assessment and Plan  Wound Therapy - Assess/Plan/Recommendations Wound Therapy - Clinical Statement: Pt with increased pain during pulsatile lavage, debridement and dressing change, despite premedication. Asked RN to ask MD for increased dosing.  Pt will benefit from hydrotherapy for selective debridement and to decrease bioburden. Wound Therapy - Functional Problem List: Decreased mobility and global weakness Factors Delaying/Impairing Wound Healing: Immobility Hydrotherapy Plan: Debridement;Dressing change;Patient/family education;Pulsatile lavage with suction Wound Therapy - Frequency: 6X / week Wound Therapy - Follow Up Recommendations: Skilled nursing facility Wound Plan: See above  Wound Therapy Goals- Improve the function of patient's integumentary system by progressing the wound(s) through the phases of wound healing (inflammation - proliferation - remodeling) by: Decrease Necrotic Tissue to: 50 Decrease Necrotic Tissue - Progress: Progressing toward goal Increase Granulation Tissue to: 50 Increase Granulation Tissue - Progress: Progressing toward goal Goals/treatment plan/discharge plan were made with and agreed upon by patient/family: Yes Time For Goal Achievement: 7 days Wound Therapy - Potential for Goals: Fair  Goals will be updated until maximal potential achieved or discharge criteria met.  Discharge criteria: when goals achieved, discharge from hospital, MD decision/surgical intervention, no progress towards goals, refusal/missing three consecutive treatments without notification or medical reason.  GP   07/06/2020  Ginger Carne., PT Acute Rehabilitation Services [phone]  (pager) [phone]  (office)  Tessie Fass  Lyric Rossano 07/06/2020, 11:16 AM

## 2020-07-06 NOTE — Progress Notes (Signed)
Nutrition Follow Up  DOCUMENTATION CODES:   Severe malnutrition in context of chronic illness  INTERVENTION:   Monitor magnesium, potassium, and phosphorus daily, MD to replete as needed, as pt is at risk for refeeding syndrome.   Tube feeding:  -Osmolite 1.5 @ 20 ml/hr via PEG -Increase by 10 ml Q4 hours to goal rate of 60 ml/hr (1440 ml) -ProSource TF 45 ml BID -Juven BID -Free water 200 ml Q6 hours (may need adjustment upon d/c)  Provides: 2320 kcals, 134 grams protein, 1097 ml free water (1897 ml with flushes)   May transition to bolus or nocturnal feeding once tolerance is established.   NUTRITION DIAGNOSIS:   Severe Malnutrition related to chronic illness (chronic wounds) as evidenced by energy intake < or equal to 75% for > or equal to 1 month,severe fat depletion,severe muscle depletion.  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 Presents this admission with UTI.   01/10- Cortrak placed  01/12- PEG placed  Prior to PEG placement pt was able to advance Osmolite 1.5 to 40 ml/hr. Now that PEG is placed would continue slow advancement as pt is actively refeeding. Discussed with MD, plan aggressive repletion of electrolytes. Pt remains NPO. Will transition to bolus/nocturnal once electrolytes improve and tolerance is established.   Admission weight: 106 kg  No current weight taken.   UOP: 750 ml x 24 hrs   Drips: D5 in 1/2 NS @ 100 ml/hr, potassium  phosphate Medications: 1000 mg Vitamin C, k phos BID, miralax, 40 mEq KCl QID Labs: Na 146 (H) K 2.9 (L) Phosphorus 2.0 (L) CBG 102-131  Diet Order:   Diet Order            Diet NPO time specified Except for: Sips with Meds  Diet effective midnight                 EDUCATION NEEDS:   Not appropriate for education at this time  Skin:  Skin Assessment: Skin Integrity Issues: Skin Integrity Issues:: DTI,Stage II,Stage IV,Stage III,Unstageable DTI: L throat Stage II: vertebral column Stage III: R throat Stage IV: sacrum Unstageable: sacrum  Last BM:  1/13  Height:   Ht Readings from Last 1 Encounters:  06/30/20 6' (1.829 m)    Weight:   Wt Readings from Last 1 Encounters:  06/30/20 106 kg    BMI:  Body mass index is 31.69 kg/m.  Estimated Nutritional Needs:   Kcal:  2300-2500 kcal  Protein:  120-135 grams  Fluid:  >/= 2 L/day  Vanessa Kick RD, LDN Clinical Nutrition Pager listed in AMION

## 2020-07-06 NOTE — Progress Notes (Signed)
Spoke with Dr. Pola Corn, restarted patients Osmolite 1.5 at 60 ml/hr with 100 ml water flush every 6 hours.  G-tube flushes well, will continue to monitor.

## 2020-07-06 NOTE — Progress Notes (Incomplete)
CC:  Subjective: ***  Objective: Vital signs in last 24 hours: Temp:  [97.5 F (36.4 C)-98.7 F (37.1 C)] 97.5 F (36.4 C) (01/13 0400) Pulse Rate:  [74-108] 79 (01/13 0000) Resp:  [10-19] 17 (01/13 0000) BP: (95-120)/(59-80) 114/79 (01/13 0000) SpO2:  [92 %-100 %] 99 % (01/13 0000) Last BM Date: 07/06/20 N.p.o. tube feeding not recorded 463 IV 750 urine Afebrile, vital signs are stable K+ 2.9 CBC stable  Intake/Output from previous day: 01/12 0701 - 01/13 0700 In: 463.4 [I.V.:218.2; IV Piggyback:245.2] Out: 750 [Urine:750] Intake/Output this shift: No intake/output data recorded.  {physical CLEX:5170017}  Lab Results:  Recent Labs    07/05/20 0229 07/06/20 0338  WBC 8.2 7.8  HGB 9.3* 8.8*  HCT 31.3* 28.4*  PLT 230 190    BMET Recent Labs    07/05/20 0229 07/06/20 0338  NA 149* 146*  K 2.8* 2.9*  CL 111 108  CO2 30 31  GLUCOSE 164* 121*  BUN 23 14  CREATININE 0.39* 0.34*  CALCIUM 8.1* 7.9*   PT/INR No results for input(s): LABPROT, INR in the last 72 hours.  Recent Labs  Lab 06/30/20 1006 07/02/20 0104 07/03/20 0849  AST 20 15 16   ALT 10 9 8   ALKPHOS 98 84 83  BILITOT 0.9 1.4* 1.1  PROT 6.2* 5.5* 5.6*  ALBUMIN 1.7* 1.3* 1.3*     Lipase     Component Value Date/Time   LIPASE 13 06/30/2020 1006     Medications: . acetaminophen  1,000 mg Per Tube TID  . ascorbic acid  1,000 mg Per Tube Daily  . Chlorhexidine Gluconate Cloth  6 each Topical Daily  . collagenase   Topical Daily  . enoxaparin (LOVENOX) injection  40 mg Subcutaneous Q24H  . escitalopram  2.5 mg Per Tube QHS  . feeding supplement (PROSource TF)  45 mL Per Tube TID  . ferrous sulfate  300 mg Per Tube BID WC  . free water  200 mL Per Tube Q6H  . gabapentin  200 mg Per Tube BID  . mouth rinse  15 mL Mouth Rinse BID  . midodrine  10 mg Per Tube BID WC  . nutrition supplement (JUVEN)  1 packet Per Tube BID  . pantoprazole sodium  40 mg Per Tube QHS  . phosphorus   500 mg Per NG tube BID  . polyethylene glycol  17 g Per Tube q morning - 10a  . potassium chloride  40 mEq Per Tube BID  . scopolamine  1 patch Transdermal Q72H  . sodium chloride flush  3 mL Intravenous Q12H  . tamsulosin  0.4 mg Oral Daily   . dextrose 5 % and 0.45% NaCl 100 mL/hr at 07/06/20 0122  . feeding supplement (OSMOLITE 1.5 CAL) 40 mL/hr at 07/04/20 1647  . meropenem (MERREM) IV 2 g (07/05/20 2319)  . sodium chloride irrigation     Anti-infectives (From admission, onward)   Start     Dose/Rate Route Frequency Ordered Stop   07/04/20 1600  vancomycin (VANCOREADY) IVPB 1500 mg/300 mL  Status:  Discontinued        1,500 mg 150 mL/hr over 120 Minutes Intravenous Every 24 hours 07/03/20 0904 07/04/20 0900   07/04/20 1000  vancomycin (VANCOREADY) IVPB 1500 mg/300 mL  Status:  Discontinued        1,500 mg 150 mL/hr over 120 Minutes Intravenous Every 24 hours 07/04/20 0900 07/05/20 1054   07/01/20 0900  vancomycin (VANCOREADY) IVPB 1500 mg/300 mL  Status:  Discontinued        1,500 mg 150 mL/hr over 120 Minutes Intravenous Every 12 hours 07/01/20 0843 07/03/20 0904   06/30/20 2000  meropenem (MERREM) 2 g in sodium chloride 0.9 % 100 mL IVPB        2 g 200 mL/hr over 30 Minutes Intravenous Every 8 hours 06/30/20 1303     06/30/20 1115  meropenem (MERREM) 2 g in sodium chloride 0.9 % 100 mL IVPB        2 g 200 mL/hr over 30 Minutes Intravenous  Once 06/30/20 1036 06/30/20 1241       Assessment/Plan Quadriplegia A/C-spine surgery 10/13 after fall at home SIRS due to complicated UTI/aspiration pneumonia Acute respiratory hypoxic failure Chronic aspiration Chronic dysphagia Severe protein calorie malnutrition  Stage IV sacral decubitus ulcer  FEN: IV fluids/tube feeding ID: Vancomycin 1/8 -1/11:  Meropenem 1/7>> day 7 DVT: Lovenox  Plan:    LOS: 6 days    Lynann Demetrius 07/06/2020 Please see Amion

## 2020-07-06 NOTE — TOC Initial Note (Addendum)
Transition of Care [facility]) - Initial/Assessment Note    Patient Details  Name: Shannon [name] MRN: [id number] Date of Birth: [birth year]  Transition of Care [facility]) CM/SW Contact:    Lorri Frederick, LCSW Phone Number: 07/06/2020, 10:37 AM  Clinical Narrative:   CSW attempted to speak with pt who is not able to participate in assessment.  Spoke with daughter [name] who confirms that she would like pt to continue at [facility] when medically ready.  CSW has confirmed with [place] that pt is good to return when ready.  Pt is vaccinated for covid.                   Expected Discharge Plan: Skilled Nursing Facility Barriers to Discharge: Continued Medical Work up   Patient Goals and CMS Choice   CMS Medicare.gov Compare Post Acute Care list provided to::  (daughter wants to continue at [facility] when pt is medically ready)    Expected Discharge Plan and Services Expected Discharge Plan: Skilled Nursing Facility     Post Acute Care Choice: Skilled Nursing Facility Living arrangements for the past 2 months: Skilled Nursing Facility                                      Prior Living Arrangements/Services Living arrangements for the past 2 months: Skilled Nursing Facility Lives with:: Facility Resident          Need for Family Participation in Patient Care: Yes (Comment) Care giver support system in place?: Yes (comment) Current home services: Other (comment) (na) Criminal Activity/Legal Involvement Pertinent to Current Situation/Hospitalization: No - Comment as needed  Activities of Daily Living      Permission Sought/Granted                  Emotional Assessment Appearance:: Appears stated age Attitude/Demeanor/Rapport: Unable to Assess Affect (typically observed): Unable to Assess Orientation: : Oriented to Self,Oriented to Place,Oriented to  Time,Oriented to Situation Alcohol / Substance Use: Not Applicable Psych Involvement: No  (comment)  Admission diagnosis:  SOB (shortness of breath) [R06.02] Pleural effusion on right [J90] Acute respiratory failure with hypoxia (HCC) [J96.01] Acute hypoxemic respiratory failure due to severe acute respiratory syndrome coronavirus 2 (SARS-CoV-2) disease (HCC) [U07.1, J96.01] COVID-19 virus infection [U07.1] Patient Active Problem List   Diagnosis Date Noted  . Acute respiratory failure with hypoxia (HCC) 06/30/2020  . Hematuria 06/19/2020  . Acute lower UTI 06/19/2020  . Sacral decubitus ulcer 06/19/2020  . COVID-19 virus infection 06/06/2020 06/19/2020  . UTI (urinary tract infection) 06/19/2020  . Palliative care by specialist   . Goals of care, counseling/discussion   . Respiration abnormal   . Respiratory distress   . Protein-calorie malnutrition, severe 05/05/2020  . Pressure injury of skin 05/04/2020  . Aspiration pneumonia (HCC) 05/03/2020  . Sepsis secondary to UTI (HCC) 05/03/2020  . Urinary retention 05/03/2020  . Chronic indwelling Foley catheter 05/03/2020  . 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 Myelopathy (HCC) 04/22/2020  . Quadriplegia and quadriparesis (HCC)   . Acute blood loss anemia   . Slow transit constipation   . Essential hypertension   . Cervical myelopathy (HCC) 04/07/2020  . Quadriparesis (HCC)   . Benign essential HTN   . Post-operative pain   . Neuropathic pain   . Asthma   . 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   PCP:  McGowen,  Maryjean Morn, MD Pharmacy:  No Pharmacies Listed    Social Determinants of Health (SDOH) Interventions    Readmission Risk Interventions No flowsheet data found.

## 2020-07-06 NOTE — Progress Notes (Signed)
Spoke with dietician, changed patients Osmolite 1.5 settings to 20 ml/hr and will increase by 10 in 4 hours.  Free water flush has been set for 200 ml/every 6 hours.  Will continue to monitor.

## 2020-07-07 DIAGNOSIS — L89154 Pressure ulcer of sacral region, stage 4: Secondary | ICD-10-CM | POA: Diagnosis not present

## 2020-07-07 LAB — GLUCOSE, CAPILLARY
Glucose-Capillary: 112 mg/dL — ABNORMAL HIGH (ref 70–99)
Glucose-Capillary: 113 mg/dL — ABNORMAL HIGH (ref 70–99)
Glucose-Capillary: 115 mg/dL — ABNORMAL HIGH (ref 70–99)
Glucose-Capillary: 121 mg/dL — ABNORMAL HIGH (ref 70–99)
Glucose-Capillary: 123 mg/dL — ABNORMAL HIGH (ref 70–99)
Glucose-Capillary: 127 mg/dL — ABNORMAL HIGH (ref 70–99)
Glucose-Capillary: 146 mg/dL — ABNORMAL HIGH (ref 70–99)

## 2020-07-07 LAB — CBC
HCT: 31 % — ABNORMAL LOW (ref 39.0–52.0)
Hemoglobin: 9.2 g/dL — ABNORMAL LOW (ref 13.0–17.0)
MCH: 28.1 pg (ref 26.0–34.0)
MCHC: 29.7 g/dL — ABNORMAL LOW (ref 30.0–36.0)
MCV: 94.8 fL (ref 80.0–100.0)
Platelets: 188 10*3/uL (ref 150–400)
RBC: 3.27 MIL/uL — ABNORMAL LOW (ref 4.22–5.81)
RDW: 14.4 % (ref 11.5–15.5)
WBC: 6.2 10*3/uL (ref 4.0–10.5)
nRBC: 0 % (ref 0.0–0.2)

## 2020-07-07 LAB — BASIC METABOLIC PANEL
Anion gap: 5 (ref 5–15)
BUN: 17 mg/dL (ref 8–23)
CO2: 32 mmol/L (ref 22–32)
Calcium: 8.1 mg/dL — ABNORMAL LOW (ref 8.9–10.3)
Chloride: 108 mmol/L (ref 98–111)
Creatinine, Ser: 0.36 mg/dL — ABNORMAL LOW (ref 0.61–1.24)
GFR, Estimated: >60 mL/min (ref >60)
Glucose, Bld: 138 mg/dL — ABNORMAL HIGH (ref 70–99)
Potassium: 4.5 mmol/L (ref 3.5–5.1)
Sodium: 145 mmol/L (ref 135–145)

## 2020-07-07 LAB — PHOSPHORUS: Phosphorus: 2.2 mg/dL — ABNORMAL LOW (ref 2.5–4.6)

## 2020-07-07 LAB — MAGNESIUM: Magnesium: 1.9 mg/dL (ref 1.7–2.4)

## 2020-07-07 MED ORDER — POTASSIUM PHOSPHATES 15 MMOLE/5ML IV SOLN
20.0000 mmol | Freq: Once | INTRAVENOUS | Status: AC
  Administered 2020-07-07: 20 mmol via INTRAVENOUS
  Filled 2020-07-07: qty 6.67

## 2020-07-07 MED ORDER — HYDROMORPHONE HCL 1 MG/ML IJ SOLN
0.5000 mg | Freq: Every day | INTRAMUSCULAR | Status: DC | PRN
  Administered 2020-07-08 – 2020-07-11 (×3): 0.5 mg via INTRAVENOUS
  Filled 2020-07-07 (×3): qty 1

## 2020-07-07 NOTE — Progress Notes (Signed)
Subjective: Having pain with hydrotherapy today.  ROS: See above, otherwise other systems negative  Objective: Vital signs in last 24 hours: Temp:  [98.2 F (36.8 C)-98.7 F (37.1 C)] 98.3 F (36.8 C) (01/14 0745) Pulse Rate:  [61-88] 88 (01/14 0745) Resp:  [14-20] 19 (01/14 0745) BP: (93-105)/(64-74) 100/74 (01/14 0745) SpO2:  [97 %-100 %] 100 % (01/14 0745) Weight:  [104.3 kg] 104.3 kg (01/14 0327) Last BM Date: 07/06/20  Intake/Output from previous day: 01/13 0701 - 01/14 0700 In: 1233 [NG/GT:703; IV Piggyback:100] Out: 1550 [Urine:1200; Stool:350] Intake/Output this shift: No intake/output data recorded.  PE: Skin: wound is cleaning up.  Exposed and loose bone palpable at the inferior aspect of the wound.  Overall fairly clean.  No evidence of infection or purulent fluid     Lab Results:  Recent Labs    07/06/20 0338 07/07/20 0340  WBC 7.8 6.2  HGB 8.8* 9.2*  HCT 28.4* 31.0*  PLT 190 188   BMET Recent Labs    07/06/20 0338 07/07/20 0340  NA 146* 145  K 2.9* 4.5  CL 108 108  CO2 31 32  GLUCOSE 121* 138*  BUN 14 17  CREATININE 0.34* 0.36*  CALCIUM 7.9* 8.1*   PT/INR No results for input(s): LABPROT, INR in the last 72 hours. CMP     Component Value Date/Time   NA 145 07/07/2020 0340   K 4.5 07/07/2020 0340   CL 108 07/07/2020 0340   CO2 32 07/07/2020 0340   GLUCOSE 138 (H) 07/07/2020 0340   BUN 17 07/07/2020 0340   CREATININE 0.36 (L) 07/07/2020 0340   CALCIUM 8.1 (L) 07/07/2020 0340   PROT 5.6 (L) 07/03/2020 0849   ALBUMIN 1.3 (L) 07/03/2020 0849   AST 16 07/03/2020 0849   ALT 8 07/03/2020 0849   ALKPHOS 83 07/03/2020 0849   BILITOT 1.1 07/03/2020 0849   GFRNONAA >60 07/07/2020 0340   GFRAA >60 01/09/2020 1317   Lipase     Component Value Date/Time   LIPASE 13 06/30/2020 1006       Studies/Results: IR GASTROSTOMY TUBE MOD SED  Result Date: 07/05/2020 INDICATION: 75 year old male with quadriplegia and dysphagia.  EXAM: PERC PLACEMENT GASTROSTOMY MEDICATIONS: None. ANESTHESIA/SEDATION: Versed 0.5 mg IV; Fentanyl 25 mcg IV Moderate Sedation Time:  17 The patient was continuously monitored during the procedure by the interventional radiology nurse under my direct supervision. CONTRAST:  69mL OMNIPAQUE IOHEXOL 300 MG/ML SOLN - administered into the gastric lumen. FLUOROSCOPY TIME:  Fluoroscopy Time: 0 minutes 36 seconds (5 mGy). COMPLICATIONS: None immediate. PROCEDURE: Informed written consent was obtained from the patient after a thorough discussion of the procedural risks, benefits and alternatives. All questions were addressed. Maximal Sterile Barrier Technique was utilized including caps, mask, sterile gowns, sterile gloves, sterile drape, hand hygiene and skin antiseptic. A timeout was performed prior to the initiation of the procedure. The patient was placed on the procedure table in the supine position. Pre-procedure abdominal film confirmed visualization of the transverse colon. The patient was prepped and draped in usual sterile fashion. The stomach was insufflated with air via the indwelling nasogastric tube. Under fluoroscopy, a puncture site was selected and local analgesia achieved with 1% lidocaine infiltrated subcutaneously. Under fluoroscopic guidance, a gastropexy needle was passed into the stomach and the T-bar suture was released. Entry into the stomach was confirmed with fluoroscopy, aspiration of air, and injection of contrast material. This was repeated with an additional gastropexy suture (for a total  of 2 fasteners). At the center of these gastropexy sutures, a dermatotomy was performed. An 18 gauge needle was passed into the stomach atthe site of this dermatotomy, and position within the gastric lumen again confirmed under fluoroscopy using aspiration of air and contrast injection. An Amplatz guidewire was passed through this needle and intraluminal placement within the stomach was confirmed by  fluoroscopy. The needle was removed. Over the guidewire, the percutaneous tract was dilated using a 10 mm non-compliant balloon. The balloon was deflated, then pushed into the gastric lumen followed in concert by the 20 Fr gastrostomy tube. The retention balloon of the percutaneous gastrostomy tube was inflated with 10 mL of sterile water. The tube was withdrawn until the retention balloon was at the edge of the gastric lumen. The external bumper was brought to the abdominal wall. Contrast was injected through the gastrostomy tube, confirming intraluminal positioning. The patient tolerated the procedure well without any immediate post-procedural complications. IMPRESSION: Technically successful placement of 20 Fr gastrostomy tube. PLAN: Return in 2 weeks for gastropexy suture release. Marliss Coots, MD [facility] Radiology Electronically Signed   By: Marliss Coots MD   On: 07/05/2020 17:38    Anti-infectives: Anti-infectives (From admission, onward)   Start     Dose/Rate Route Frequency Ordered Stop   07/04/20 1600  vancomycin (VANCOREADY) IVPB 1500 mg/300 mL  Status:  Discontinued        1,500 mg 150 mL/hr over 120 Minutes Intravenous Every 24 hours 07/03/20 0904 07/04/20 0900   07/04/20 1000  vancomycin (VANCOREADY) IVPB 1500 mg/300 mL  Status:  Discontinued        1,500 mg 150 mL/hr over 120 Minutes Intravenous Every 24 hours 07/04/20 0900 07/05/20 1054   07/01/20 0900  vancomycin (VANCOREADY) IVPB 1500 mg/300 mL  Status:  Discontinued        1,500 mg 150 mL/hr over 120 Minutes Intravenous Every 12 hours 07/01/20 0843 07/03/20 0904   06/30/20 2000  meropenem (MERREM) 2 g in sodium chloride 0.9 % 100 mL IVPB        2 g 200 mL/hr over 30 Minutes Intravenous Every 8 hours 06/30/20 1303 07/06/20 2359   06/30/20 1115  meropenem (MERREM) 2 g in sodium chloride 0.9 % 100 mL IVPB        2 g 200 mL/hr over 30 Minutes Intravenous  Once 06/30/20 1036  06/30/20 1241       Assessment/Plan  Stage IV sacral decubitus ulcer -doing well with hydrotherapy although very painful for patient -no further plans for surgical debridement -cont WD dressing changes upon discharge -should follow up with the wound center -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 -no further surgical needs.  We will sign off     LOS: 7 days    Letha Cape , [facility] Surgery 07/07/2020, 10:34 AM Please see Amion for pager number during day hours 7:00am-4:30pm or 7:00am -11:30am on weekends

## 2020-07-07 NOTE — Progress Notes (Signed)
PROGRESS NOTE    Shannon [name]  OZD:[id number]  DOB: [birth year]  DOA: 06/30/2020 PCP: [name], MD Outpatient Specialists:   Hospital course:  Shannon [name] is a 75 y.o. 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  Then he had a fall at home developed epidural hematoma, underwent reexploration, removal of hardware, evacuation of hematoma on 10/30, now quadriplegic with dysphagia, unstageable sacral decubitus wounds, failure to thrive-with numerous recent hospitalizations for aspiration pneumonia/hematuria/UTI.   Patient was brought to the ED from SNF for evaluation of recurrent hematuria, cough.  In the ED, he briefly required up to 10 L of oxygen. Chest x-ray showed dense opacity at the right lung base. Urinalysis showed turbid brown urine. Patient was admitted for acute hypoxic respiratory due to aspiration pneumonia and complicated UTI. Urine culture sent on admission grew multidrug-resistant Achromobacter xylosoxidans  During the course of aspiration, patient was found to have significant dysphagia, worse compared to prior. PEG tube was placed and refeeding was complicated by refeeding syndrome.  Subjective:  Patient himself does not have any complaints at present.  PT at bedside however notes that patient appeared to be in significant pain during debridement of his sacral ulcer.  Patient agrees that there was pain with debridement.  She denies constipation.   Objective: Vitals:   07/07/20 0327 07/07/20 0745 07/07/20 1153 07/07/20 1559  BP: 93/69 100/74 (!) 87/68 96/61  Pulse: 86 88 76 86  Resp: 18 19 18 12   Temp: 98.7 F (37.1 C) 98.3 F (36.8 C) 98.1 F (36.7 C) 97.9 F (36.6 C)  TempSrc: Oral Oral Oral Oral  SpO2: 97% 100% 100% 100%  Weight: 104.3 kg     Height:        Intake/Output Summary (Last 24 hours) at 07/07/2020  1631 Last data filed at 07/07/2020 0545 Gross per 24 hour  Intake 1233 ml  Output 1550 ml  Net -317 ml   Filed Weights   06/30/20 1143 07/07/20 0327  Weight: 106 kg 104.3 kg     Exam:  General: Quadriplegic patient lying in bed being attended to by nurses. Eyes: sclera anicteric, conjuctiva mild injection bilaterally CVS: S1-S2, regular  Respiratory:  decreased air entry bilaterally secondary to decreased inspiratory effort, rales at bases  GI: NABS, soft, NT, PEG tube CDI without discharge LE: Edema in all of his extremities. Neuro: A/O x 3, quadriplegic Psych: patient is logical and coherent, judgement and insight appear normal, mood and affect appropriate to situation.   Assessment & Plan:   75 year old quadriplegic man with FTT and numerous recent hospitalization was admitted for SIRS secondary to aspiration pneumonia and complicated UTI.  Hospital course has been complicated by refeeding syndrome with severe hypophosphatemia after placement of PEG tube.  Refeeding syndrome with hypophosphatemia and hypomagnesemia Potassium and magnesium are within normal Phosphorus is improved but still low at 2.2 Patient is already on aggressive K-Phos oral twice daily Will add IV K-Phos x1 and recheck in the morning  Chronic sacral decubitus ulcer stage IV - POA Appreciate surgery consultation, s/p + debridement 07/03/2020 Continue hydrotherapy with debridement per PT Will add Dilaudid 0.5 mg IV pretreatment for pain prior to hydrotherapy  SIRS secondary to complicated UTI and aspiration pneumonia Patient has completed 7-day course of broad-spectrum antibiotics with meropenem and vancomycin. 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 Lorain Childes note from yesterday: Chronic aspiration -Due to chronic dysphagia. -CT chest from 1/11 showed complete opacification of the both lower lobes and subtotal opacification of right middle lobe.  There are layering debris's in the left mainstem and lower lobe bronchi.  These findings are suspicious for recurrent aspiration.   UTI associated with chronic Foley catheter -Urinalysis on admission showed turbid brown urine.   Urine culture grew more than 1000 CFU per mL of multidrug-resistant Achromobacter xylosoxidans.  History ofCOVID-19 infection: -Positive Covid PCR on admission is a sequelae of prior infection-doubt he requires any further treatment or airborne isolation for this issue. Continue to observe-his hypoxia is due to aspiration pneumonia.   Quadriplegia with dysphagia-cervical myelopathy-s/p C-spine surgery on 10/13-subsequently sustained a fall at home and developed epidural hematoma-underwent reexploration/removal of hardware and evacuation of hematoma on 10/30. Per prior documentation-plan is to leave cervical collar for 3 months total-till mid January. Continues to have significant quadriplegia (able to move upper extremity side-by-side-unable to lift off the bed against gravity, able to only wiggle toes-unable to lift lower extremities off the bed against gravity)  Swelling of both upper extremities -Multifactorial: Chronic immobility, low albumin level, compression bandages on arm and forearm.  Keep extremities  elevated.  Multifactorial anemia-anemia of chronic disease/vitamin B12 deficiency/borderline iron deficiency/acute blood loss due to hematuria: -Hemoglobin at baseline-continue iron supplementation   DVT prophylaxis: Lovenox Code Status: Full Family Communication: None today Disposition Plan:   Patient is from: SNF  Anticipated Discharge Location: SNF  Barriers to Discharge: Ongoing hypophosphatemia  Is patient medically stable for Discharge: He will be once his phosphates are normalized and the rest of his electrolytes stay normal   Consultants:  General surgery  Procedures:  Bedside debridement 07/03/2020  Antimicrobials:  None at present status post meropenem and vancomycin   Data Reviewed:  Basic Metabolic Panel: Recent Labs  Lab 07/03/20 0849 07/04/20 0423 07/05/20 0229 07/06/20 0338 07/07/20 0340  NA 146* 148* 149* 146* 145  K 3.2* 3.1* 2.8* 2.9* 4.5  CL 110 111 111 108 108  CO2 25 27 30 31  32  GLUCOSE 79 118* 164* 121* 138*  BUN 6* 16 23 14 17   CREATININE 0.65 0.63 0.39* 0.34* 0.36*  CALCIUM 8.1* 8.3* 8.1* 7.9* 8.1*  MG  --  1.6* 2.3  --  1.9  PHOS  --  1.5* 1.2* 2.0* 2.2*   Liver Function Tests: Recent Labs  Lab 07/02/20 0104 07/03/20 0849  AST 15 16  ALT 9 8  ALKPHOS 84 83  BILITOT 1.4* 1.1  PROT 5.5* 5.6*  ALBUMIN 1.3* 1.3*   No results for input(s): LIPASE, AMYLASE in the last 168 hours. No results for input(s): AMMONIA in the last 168 hours. CBC: Recent Labs  Lab 07/03/20 0849 07/04/20 0423 07/05/20 0229 07/06/20 0338 07/07/20 0340  WBC 11.9* 9.0 8.2 7.8 6.2  NEUTROABS 10.1* 6.8 6.1 5.6  --  HGB 8.8* 9.1* 9.3* 8.8* 9.2*  HCT 29.3* 29.7* 31.3* 28.4* 31.0*  MCV 95.1 94.3 96.0 93.4 94.8  PLT 216 240 230 190 188   Cardiac Enzymes: No results for input(s): CKTOTAL, CKMB, CKMBINDEX, TROPONINI in the last 168 hours. BNP (last 3 results) No results for input(s): PROBNP in the last 8760 hours. CBG: Recent Labs  Lab  07/07/20 0025 07/07/20 0424 07/07/20 0740 07/07/20 1147 07/07/20 1604  GLUCAP 123* 127* 146* 115* 121*    Recent Results (from the past 240 hour(s))  Blood Culture (routine x 2)     Status: None   Collection Time: 06/30/20 10:05 AM   Specimen: BLOOD  Result Value Ref Range Status   Specimen Description BLOOD RIGHT ANTECUBITAL  Final   Special Requests   Final    BOTTLES DRAWN AEROBIC AND ANAEROBIC Blood Culture adequate volume   Culture   Final    NO GROWTH 5 DAYS Performed at [facility], [address], [place], [place] [address]    Report Status 07/05/2020 FINAL  Final  Blood Culture (routine x 2)     Status: None   Collection Time: 06/30/20 10:06 AM   Specimen: BLOOD LEFT FOREARM  Result Value Ref Range Status   Specimen Description BLOOD LEFT FOREARM  Final   Special Requests   Final    BOTTLES DRAWN AEROBIC AND ANAEROBIC Blood Culture adequate volume   Culture   Final    NO GROWTH 5 DAYS Performed at [facility], [address] [place], [place] [address]    Report Status 07/05/2020 FINAL  Final  Urine culture     Status: Abnormal   Collection Time: 06/30/20 10:47 AM   Specimen: In/Out Cath Urine  Result Value Ref Range Status   Specimen Description IN/OUT CATH URINE  Final   Special Requests   Final    NONE Performed at [facility], [address], [place], [place] [address]    Culture >=100,000 COLONIES/mL ACHROMOBACTER XYLOSOXIDANS (A)  Final   Report Status 07/02/2020 FINAL  Final   Organism ID, Bacteria ACHROMOBACTER XYLOSOXIDANS (A)  Final      Susceptibility   Achromobacter xylosoxidans - MIC*    CEFAZOLIN >=64 RESISTANT Resistant     GENTAMICIN >=16 RESISTANT Resistant     CIPROFLOXACIN >=4 RESISTANT Resistant     IMIPENEM 1 SENSITIVE Sensitive     TRIMETH/SULFA <=20 SENSITIVE Sensitive     * >=100,000 COLONIES/mL ACHROMOBACTER XYLOSOXIDANS  MRSA PCR Screening     Status: None   Collection Time: 07/04/20 12:54 PM   Specimen:  Nasal Mucosa; Nasopharyngeal  Result Value Ref Range Status   MRSA by PCR NEGATIVE NEGATIVE Final    Comment:        The GeneXpert MRSA Assay (FDA approved for NASAL specimens only), is one component of a comprehensive MRSA colonization surveillance program. It is not intended to diagnose MRSA infection nor to guide or monitor treatment for MRSA infections. Performed at [facility], [address], [place], [place] [address]       Studies: No results found.   Scheduled Meds: . acetaminophen  1,000 mg Per Tube TID  . ascorbic acid  1,000 mg Per Tube Daily  . Chlorhexidine Gluconate Cloth  6 each Topical Daily  . citalopram  5 mg Per Tube Daily  . collagenase   Topical Daily  . enoxaparin (LOVENOX) injection  40 mg Subcutaneous Q24H  . feeding supplement (PROSource TF)  45 mL Per Tube TID  . ferrous sulfate  300 mg Per Tube BID WC  . free water  200 mL Per Tube Q6H  . gabapentin  200 mg Per Tube BID  . mouth rinse  15 mL Mouth Rinse BID  . midodrine  10 mg Per Tube BID WC  . nutrition supplement (JUVEN)  1 packet Per Tube BID  . pantoprazole sodium  40 mg Per Tube QHS  . polyethylene glycol  17 g Per Tube q morning - 10a  . potassium chloride  40 mEq Per Tube TID AC & HS  . scopolamine  1 patch Transdermal Q72H  . sodium chloride flush  3 mL Intravenous Q12H  . tamsulosin  0.4 mg Oral Daily   Continuous Infusions: . dextrose 5 % and 0.45% NaCl 50 mL/hr at 07/06/20 1513  . feeding supplement (OSMOLITE 1.5 CAL) 1,000 mL (07/06/20 0938)  . sodium chloride irrigation      Active Problems:   Quadriparesis (HCC)   Quadriplegia and quadriparesis (HCC)   Aspiration pneumonia (HCC)   Protein-calorie malnutrition, severe   Hematuria   Sacral decubitus ulcer   COVID-19 virus infection 06/06/2020   UTI (urinary tract infection)   Acute respiratory failure with hypoxia (HCC)     Edy Mcbane Orma Flamingublu Julissa Browning, [facility]  If 7PM-7AM, please contact  night-coverage [email or web address] Password [email or web address] 07/07/2020, 4:31 PM    LOS: 7 days

## 2020-07-07 NOTE — Progress Notes (Signed)
Physical Therapy Wound Treatment Patient Details  Name: Shannon [name] MRN: [id number] Date of Birth: September 05, 1945  Today's Date: 07/07/2020 Time: 1010-1045 Time Calculation (min): 35 min  Subjective  Subjective: More alert today Patient and Family Stated Goals: did not state Date of Onset:  (unknown) Prior Treatments: Dressing change, hydrotherapy prior admission 05/2020, s/p bedside debridement 07/04/2019  Pain Score:  4-6/10    Wound Assessment  Pressure Injury 06/22/20 Sacrum Posterior Stage 4 - Full thickness tissue loss with exposed bone, tendon or muscle. PHYSICAL THERAPY/HYDROTHERAPY (Active)  Wound Image   07/07/20 1344  Dressing Type Barrier Film (skin prep);Gauze (Comment);Moist to dry 07/07/20 1344  Dressing Changed;Clean 07/07/20 1344  Dressing Change Frequency Daily 07/07/20 1344  State of Healing Eschar 07/07/20 1344  Site / Wound Assessment Bleeding;Brown;Red;Yellow 07/07/20 1344  % Wound base Red or Granulating 30% 07/07/20 1344  % Wound base Yellow/Fibrinous Exudate 35% 07/07/20 1344  % Wound base Black/Eschar 35% 07/07/20 1344  % Wound base Other/Granulation Tissue (Comment) 0% 07/07/20 1344  Peri-wound Assessment Intact 07/07/20 1344  Wound Length (cm) 11 cm 07/04/20 1355  Wound Width (cm) 11 cm 07/04/20 1355  Wound Depth (cm) 2 cm 07/04/20 1355  Wound Surface Area (cm^2) 121 cm^2 07/04/20 1355  Wound Volume (cm^3) 242 cm^3 07/04/20 1355  Undermining (cm) 3 07/04/20 1355  Margins Unattached edges (unapproximated) 07/07/20 1344  Drainage Amount Moderate 07/07/20 1344  Drainage Description Serous;Sanguineous 07/07/20 1344  Treatment Cleansed;Debridement (Selective);Hydrotherapy (Pulse lavage);Packing (Saline gauze) 07/07/20 1344   Santyl applied to wound bed prior to applying dressing.    Hydrotherapy Pulsed lavage therapy - wound location: sacrum Pulsed Lavage with Suction (psi): 8 psi Pulsed Lavage with Suction - Normal Saline Used: 1000 mL Pulsed  Lavage Tip: Tip with splash shield Selective Debridement Selective Debridement - Location: Sacrum Selective Debridement - Tools Used: Forceps;Scissors Selective Debridement - Tissue Removed: slough   Wound Assessment and Plan  Wound Therapy - Assess/Plan/Recommendations Wound Therapy - Clinical Statement: Pt with increased pain during pulsatile lavage, debridement and dressing change, despite premedication. Asked RN to ask MD for increased dosing.  Pt will benefit from hydrotherapy for selective debridement and to decrease bioburden. Wound Therapy - Functional Problem List: Decreased mobility and global weakness Factors Delaying/Impairing Wound Healing: Immobility Hydrotherapy Plan: Debridement;Dressing change;Patient/family education;Pulsatile lavage with suction Wound Therapy - Frequency: 6X / week Wound Therapy - Follow Up Recommendations: Skilled nursing facility Wound Plan: See above  Wound Therapy Goals- Improve the function of patient's integumentary system by progressing the wound(s) through the phases of wound healing (inflammation - proliferation - remodeling) by: Decrease Necrotic Tissue to: 50 Decrease Necrotic Tissue - Progress: Progressing toward goal Increase Granulation Tissue to: 50 Increase Granulation Tissue - Progress: Progressing toward goal Goals/treatment plan/discharge plan were made with and agreed upon by patient/family: Yes Time For Goal Achievement: 7 days Wound Therapy - Potential for Goals: Fair  Goals will be updated until maximal potential achieved or discharge criteria met.  Discharge criteria: when goals achieved, discharge from hospital, MD decision/surgical intervention, no progress towards goals, refusal/missing three consecutive treatments without notification or medical reason.  GP     Tessie Fass Mandie Crabbe 07/07/2020, 3:10 PM

## 2020-07-08 DIAGNOSIS — E878 Other disorders of electrolyte and fluid balance, not elsewhere classified: Secondary | ICD-10-CM

## 2020-07-08 LAB — CBC
HCT: 30.5 % — ABNORMAL LOW (ref 39.0–52.0)
Hemoglobin: 9.5 g/dL — ABNORMAL LOW (ref 13.0–17.0)
MCH: 29.1 pg (ref 26.0–34.0)
MCHC: 31.1 g/dL (ref 30.0–36.0)
MCV: 93.3 fL (ref 80.0–100.0)
Platelets: 195 10*3/uL (ref 150–400)
RBC: 3.27 MIL/uL — ABNORMAL LOW (ref 4.22–5.81)
RDW: 14.4 % (ref 11.5–15.5)
WBC: 5.6 10*3/uL (ref 4.0–10.5)
nRBC: 0 % (ref 0.0–0.2)

## 2020-07-08 LAB — BASIC METABOLIC PANEL
Anion gap: 6 (ref 5–15)
BUN: 18 mg/dL (ref 8–23)
CO2: 31 mmol/L (ref 22–32)
Calcium: 8 mg/dL — ABNORMAL LOW (ref 8.9–10.3)
Chloride: 107 mmol/L (ref 98–111)
Creatinine, Ser: 0.35 mg/dL — ABNORMAL LOW (ref 0.61–1.24)
GFR, Estimated: >60 mL/min (ref >60)
Glucose, Bld: 125 mg/dL — ABNORMAL HIGH (ref 70–99)
Potassium: 4.9 mmol/L (ref 3.5–5.1)
Sodium: 144 mmol/L (ref 135–145)

## 2020-07-08 LAB — GLUCOSE, CAPILLARY
Glucose-Capillary: 125 mg/dL — ABNORMAL HIGH (ref 70–99)
Glucose-Capillary: 128 mg/dL — ABNORMAL HIGH (ref 70–99)
Glucose-Capillary: 104 mg/dL — ABNORMAL HIGH (ref 70–99)
Glucose-Capillary: 106 mg/dL — ABNORMAL HIGH (ref 70–99)
Glucose-Capillary: 119 mg/dL — ABNORMAL HIGH (ref 70–99)
Glucose-Capillary: 110 mg/dL — ABNORMAL HIGH (ref 70–99)

## 2020-07-08 LAB — PHOSPHORUS: Phosphorus: 1.8 mg/dL — ABNORMAL LOW (ref 2.5–4.6)

## 2020-07-08 LAB — MAGNESIUM: Magnesium: 1.8 mg/dL (ref 1.7–2.4)

## 2020-07-08 MED ORDER — K PHOS MONO-SOD PHOS DI & MONO 155-852-130 MG PO TABS
500.0000 mg | ORAL_TABLET | Freq: Three times a day (TID) | ORAL | Status: DC
  Administered 2020-07-08 – 2020-07-09 (×2): 500 mg via ORAL
  Filled 2020-07-08 (×2): qty 2

## 2020-07-08 MED ORDER — POTASSIUM PHOSPHATES 15 MMOLE/5ML IV SOLN
30.0000 mmol | Freq: Once | INTRAVENOUS | Status: AC
  Administered 2020-07-08: 30 mmol via INTRAVENOUS
  Filled 2020-07-08: qty 10

## 2020-07-08 MED ORDER — MAGNESIUM SULFATE 2 GM/50ML IV SOLN
2.0000 g | Freq: Once | INTRAVENOUS | Status: AC
  Administered 2020-07-08: 2 g via INTRAVENOUS
  Filled 2020-07-08: qty 50

## 2020-07-08 MED ORDER — SODIUM CHLORIDE 0.9 % IV BOLUS
1000.0000 mL | Freq: Once | INTRAVENOUS | Status: AC
  Administered 2020-07-09: 1000 mL via INTRAVENOUS

## 2020-07-08 NOTE — Progress Notes (Signed)
Physical Therapy Wound Treatment Patient Details  Name: Shannon [name] MRN: [id number] Date of Birth: [birth year]  Today's Date: 07/08/2020 Time: 1005-1045 Time Calculation (min): 40 min  Subjective  Subjective: More alert today Patient and Family Stated Goals: did not state Date of Onset:  (unknown) Prior Treatments: Dressing change, hydrotherapy prior admission 05/2020, s/p bedside debridement 07/04/2019  Pain Score:  4/10   Pre medicated with Dilaudid  Wound Assessment  Pressure Injury 06/22/20 Sacrum Posterior Stage 4 - Full thickness tissue loss with exposed bone, tendon or muscle. PHYSICAL THERAPY/HYDROTHERAPY (Active)  Wound Image   07/07/20 1344  Dressing Type ABD;Barrier Film (skin prep);Gauze (Comment);Moist to dry;Other (Comment) 07/08/20 1109  Dressing Clean;Intact;Dry 07/07/20 1922  Dressing Change Frequency Daily 07/08/20 1109  State of Healing Eschar 07/08/20 1109  Site / Wound Assessment Purple;Red;Yellow;Brown 07/08/20 1109  % Wound base Red or Granulating 30% 07/08/20 1109  % Wound base Yellow/Fibrinous Exudate 20% 07/08/20 1109  % Wound base Black/Eschar 35% 07/08/20 1109  % Wound base Other/Granulation Tissue (Comment) 15% 07/08/20 1109  Peri-wound Assessment Intact 07/08/20 1109  Wound Length (cm) 11 cm 07/04/20 1355  Wound Width (cm) 11 cm 07/04/20 1355  Wound Depth (cm) 2 cm 07/04/20 1355  Wound Surface Area (cm^2) 121 cm^2 07/04/20 1355  Wound Volume (cm^3) 242 cm^3 07/04/20 1355  Undermining (cm) 1-2 cm for ~ 9* to 12* 07/08/20 1109  Margins Unattached edges (unapproximated) 07/08/20 1109  Drainage Amount Moderate 07/08/20 1109  Drainage Description Serous;Sanguineous 07/08/20 1109  Treatment Cleansed;Debridement (Selective);Hydrotherapy (Pulse lavage);Packing (Saline gauze);Other (Comment) 07/08/20 1109   Santyl applied to wound bed prior to applying dressing.    Hydrotherapy Pulsed lavage therapy - wound location: sacrum Pulsed Lavage with Suction  (psi): 8 psi (4-8) Pulsed Lavage with Suction - Normal Saline Used: 1000 mL Pulsed Lavage Tip: Tip with splash shield Selective Debridement Selective Debridement - Location: Sacrum Selective Debridement - Tools Used: Forceps;Scissors Selective Debridement - Tissue Removed: slough, necrotic muscle   Wound Assessment and Plan  Wound Therapy - Assess/Plan/Recommendations Wound Therapy - Clinical Statement: Pt with increased pain during pulsatile lavage, debridement and dressing change, despite premedication. Asked RN to ask MD for increased dosing.  Pt will benefit from hydrotherapy for selective debridement and to decrease bioburden. Wound Therapy - Functional Problem List: Decreased mobility and global weakness Factors Delaying/Impairing Wound Healing: Immobility Hydrotherapy Plan: Debridement;Dressing change;Patient/family education;Pulsatile lavage with suction Wound Therapy - Frequency: 6X / week Wound Therapy - Follow Up Recommendations: Skilled nursing facility Wound Plan: See above  Wound Therapy Goals- Improve the function of patient's integumentary system by progressing the wound(s) through the phases of wound healing (inflammation - proliferation - remodeling) by: Decrease Necrotic Tissue to: 50 Decrease Necrotic Tissue - Progress: Progressing toward goal Increase Granulation Tissue to: 50 Increase Granulation Tissue - Progress: Progressing toward goal Goals/treatment plan/discharge plan were made with and agreed upon by patient/family: Yes Time For Goal Achievement: 7 days Wound Therapy - Potential for Goals: Fair  Goals will be updated until maximal potential achieved or discharge criteria met.  Discharge criteria: when goals achieved, discharge from hospital, MD decision/surgical intervention, no progress towards goals, refusal/missing three consecutive treatments without notification or medical reason.  GP   07/08/2020  Ginger Carne., PT Acute Rehabilitation  Services [phone]  (pager) [phone]  (office)  Tessie Fass Anae Hams 07/08/2020, 11:17 AM

## 2020-07-08 NOTE — Progress Notes (Addendum)
30F foley catheter inserted as ordered with 100 ml of pink tinged urine initial output, pt tolerated procedure well.  2317 Dr Para March notified of B/P outside of parameters @ 97/58, awaiting return communication.  2336 New orders received for NS bolus x 1000 ml, to start @ 0015. IV team was notified to provide additional access.

## 2020-07-08 NOTE — Progress Notes (Signed)
PROGRESS NOTE    Shannon [name]  JEH:[id number]  DOB: 14-Jul-1945  DOA: 06/30/2020 PCP: [name], MD Outpatient Specialists:   Hospital course:  Shannon [name] is a 75 y.o. 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  Then he had a fall at home developed epidural hematoma, underwent reexploration, removal of hardware, evacuation of hematoma on 10/30, now quadriplegic with dysphagia, unstageable sacral decubitus wounds, failure to thrive-with numerous recent hospitalizations for aspiration pneumonia/hematuria/UTI.   Patient was brought to the ED from SNF for evaluation of recurrent hematuria, cough.  In the ED, he briefly required up to 10 L of oxygen. Chest x-ray showed dense opacity at the right lung base. Urinalysis showed turbid brown urine. Patient was admitted for acute hypoxic respiratory due to aspiration pneumonia and complicated UTI. Urine culture sent on admission grew multidrug-resistant Achromobacter xylosoxidans  During the course of aspiration, patient was found to have significant dysphagia, worse compared to prior. PEG tube was placed and refeeding was complicated by refeeding syndrome.  Subjective:  Patient feels he is doing okay.  Does not have pain during debridement as far as he remembers. Is grateful for all the care he has gotten.  Objective: Vitals:   07/08/20 0600 07/08/20 0746 07/08/20 1207 07/08/20 1625  BP: 104/77 107/73 113/75 103/64  Pulse: 96     Resp: 18   17  Temp:  98.3 F (36.8 C) 98.1 F (36.7 C)   TempSrc:  Oral Oral   SpO2: 100%     Weight:      Height:        Intake/Output Summary (Last 24 hours) at 07/08/2020 1654 Last data filed at 07/07/2020 1819 Gross per 24 hour  Intake --  Output 850 ml  Net -850 ml   Filed Weights   06/30/20 1143 07/07/20 0327  Weight: 106 kg 104.3 kg      Exam:  General: Quadriplegic patient lying in bed sleeping, easily arousable by voice alone Eyes: sclera anicteric, conjuctiva mild injection bilaterally CVS: S1-S2, regular  Respiratory:  decreased air entry bilaterally secondary to decreased inspiratory effort, rales at bases  GI: NABS, soft, NT, PEG tube CDI without discharge LE: Edema in all of his extremities. Neuro: A/O x 3, quadriplegic Psych: mood and affect appropriate to situation.   Assessment & Plan:   75 year old quadriplegic man with FTT and numerous recent hospitalization was admitted for SIRS secondary to aspiration pneumonia and complicated UTI.  Hospital course has been complicated by refeeding syndrome with severe hypophosphatemia after placement of PEG tube.  Refeeding syndrome with hypophosphatemia and hypomagnesemia Phosphorus has decreased to 1.8 from 2.2 despite aggressive supplementation. Magnesium is falling although is still within normal limits and potassium is rising to high normal 4.8 today Patient had been on oral K-Phos twice daily but I do not see this on his meds anymore. Will add K-Phos 3 times daily Also K-Phos 30 mmol x 1 IV Will discontinue TID Klor-Con for now as he will be getting additional potassium and K-Phos.  Will restart work on likely tomorrow depending on the potassium level Given falling magnesium, will provide 2 g IV today  Chronic sacral decubitus ulcer stage IV - POA Appreciate surgery consultation, s/p + debridement 07/03/2020 Continue hydrotherapy with debridement per PT Continue Dilaudid 0.5 mg IV pretreatment for pain prior to hydrotherapy  SIRS secondary to complicated UTI and aspiration pneumonia Patient has  completed 7-day course of broad-spectrum antibiotics with meropenem and vancomycin. 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 Lorain Childes note from yesterday: Chronic aspiration -Due to chronic dysphagia. -CT chest from 1/11 showed complete opacification of the both lower lobes and subtotal opacification of right middle lobe.  There are layering debris's in the left mainstem and lower lobe bronchi.  These findings are suspicious for recurrent aspiration.   UTI associated with chronic Foley catheter -Urinalysis on admission showed turbid brown urine.   Urine culture grew more than 1000 CFU per mL of multidrug-resistant Achromobacter xylosoxidans.  History ofCOVID-19 infection: -Positive Covid PCR on admission is a sequelae of prior infection-doubt he requires any further treatment or airborne isolation for this issue. Continue to observe-his hypoxia is due to aspiration pneumonia.   Quadriplegia with dysphagia-cervical myelopathy-s/p C-spine surgery on 10/13-subsequently sustained a fall at home and developed epidural hematoma-underwent reexploration/removal of hardware and evacuation of hematoma on 10/30. Per prior documentation-plan is to leave cervical collar for 3 months total-till mid January. Continues to have significant quadriplegia (able to move upper extremity side-by-side-unable to lift off the bed against gravity, able to only wiggle toes-unable to lift lower extremities off the bed against gravity)  Swelling of both upper extremities -Multifactorial: Chronic  immobility, low albumin level, compression bandages on arm and forearm.  Keep extremities elevated.  Multifactorial anemia-anemia of chronic disease/vitamin B12 deficiency/borderline iron deficiency/acute blood loss due to hematuria: -Hemoglobin at baseline-continue iron supplementation   DVT prophylaxis: Lovenox Code Status: Full Family Communication: None today Disposition Plan:   Patient is from: SNF  Anticipated Discharge Location: SNF  Barriers to Discharge: Ongoing hypophosphatemia  Is patient medically stable for Discharge: He will be once his phosphates are normalized and the rest of his electrolytes stay normal   Consultants:  General surgery  Procedures:  Bedside debridement 07/03/2020  Antimicrobials:  None at present status post meropenem and vancomycin   Data Reviewed:  Basic Metabolic Panel: Recent Labs  Lab 07/04/20 0423 07/05/20 0229 07/06/20 0338 07/07/20 0340 07/08/20 0053  NA 148* 149* 146* 145 144  K 3.1* 2.8* 2.9* 4.5 4.9  CL 111 111 108 108 107  CO2 27 30 31  32 31  GLUCOSE 118* 164* 121* 138* 125*  BUN 16 23 14 17 18   CREATININE 0.63 0.39* 0.34* 0.36* 0.35*  CALCIUM 8.3* 8.1* 7.9* 8.1* 8.0*  MG 1.6* 2.3  --  1.9 1.8  PHOS 1.5* 1.2* 2.0* 2.2* 1.8*   Liver Function Tests: Recent Labs  Lab 07/02/20 0104 07/03/20 0849  AST 15 16  ALT 9 8  ALKPHOS 84 83  BILITOT 1.4* 1.1  PROT 5.5* 5.6*  ALBUMIN 1.3* 1.3*   No results for input(s): LIPASE, AMYLASE in the last 168 hours. No results for input(s): AMMONIA in the last 168 hours. CBC: Recent Labs  Lab 07/03/20 306 260 2920  07/04/20 0423 07/05/20 0229 07/06/20 0338 07/07/20 0340 07/08/20 0053  WBC 11.9* 9.0 8.2 7.8 6.2 5.6  NEUTROABS 10.1* 6.8 6.1 5.6  --   --   HGB 8.8* 9.1* 9.3* 8.8* 9.2* 9.5*  HCT 29.3* 29.7* 31.3* 28.4* 31.0* 30.5*  MCV 95.1 94.3 96.0 93.4 94.8 93.3  PLT 216 240 230 190 188 195   Cardiac Enzymes: No results for input(s): CKTOTAL, CKMB, CKMBINDEX, TROPONINI in  the last 168 hours. BNP (last 3 results) No results for input(s): PROBNP in the last 8760 hours. CBG: Recent Labs  Lab 07/07/20 2242 07/08/20 0323 07/08/20 0745 07/08/20 1205 07/08/20 1623  GLUCAP 113* 125* 128* 104* 106*    Recent Results (from the past 240 hour(s))  Blood Culture (routine x 2)     Status: None   Collection Time: 06/30/20 10:05 AM   Specimen: BLOOD  Result Value Ref Range Status   Specimen Description BLOOD RIGHT ANTECUBITAL  Final   Special Requests   Final    BOTTLES DRAWN AEROBIC AND ANAEROBIC Blood Culture adequate volume   Culture   Final    NO GROWTH 5 DAYS Performed at [facility], [address], [place], [place] [address]    Report Status 07/05/2020 FINAL  Final  Blood Culture (routine x 2)     Status: None   Collection Time: 06/30/20 10:06 AM   Specimen: BLOOD LEFT FOREARM  Result Value Ref Range Status   Specimen Description BLOOD LEFT FOREARM  Final   Special Requests   Final    BOTTLES DRAWN AEROBIC AND ANAEROBIC Blood Culture adequate volume   Culture   Final    NO GROWTH 5 DAYS Performed at [facility], [address] [place], [place] [address]    Report Status 07/05/2020 FINAL  Final  Urine culture     Status: Abnormal   Collection Time: 06/30/20 10:47 AM   Specimen: In/Out Cath Urine  Result Value Ref Range Status   Specimen Description IN/OUT CATH URINE  Final   Special Requests   Final    NONE Performed at [facility], [address] [place], [place] [address]    Culture >=100,000 COLONIES/mL ACHROMOBACTER XYLOSOXIDANS (A)  Final   Report Status 07/02/2020 FINAL  Final   Organism ID, Bacteria ACHROMOBACTER XYLOSOXIDANS (A)  Final      Susceptibility   Achromobacter xylosoxidans - MIC*    CEFAZOLIN >=64 RESISTANT Resistant     GENTAMICIN >=16 RESISTANT Resistant     CIPROFLOXACIN >=4 RESISTANT Resistant     IMIPENEM 1 SENSITIVE Sensitive     TRIMETH/SULFA <=20 SENSITIVE Sensitive     * >=100,000  COLONIES/mL ACHROMOBACTER XYLOSOXIDANS  MRSA PCR Screening     Status: None   Collection Time: 07/04/20 12:54 PM   Specimen: Nasal Mucosa; Nasopharyngeal  Result Value Ref Range Status   MRSA by PCR NEGATIVE NEGATIVE Final    Comment:        The GeneXpert MRSA Assay (FDA approved for NASAL specimens only), is one component of a comprehensive MRSA colonization surveillance program. It is not intended to diagnose MRSA infection nor to guide or monitor treatment for MRSA infections. Performed at [facility], [address], [place], [place] [address]       Studies: No results found.   Scheduled Meds: . acetaminophen  1,000 mg Per Tube TID  . ascorbic acid  1,000 mg Per Tube Daily  . Chlorhexidine Gluconate Cloth  6  each Topical Daily  . citalopram  5 mg Per Tube Daily  . collagenase   Topical Daily  . enoxaparin (LOVENOX) injection  40 mg Subcutaneous Q24H  . feeding supplement (PROSource TF)  45 mL Per Tube TID  . ferrous sulfate  300 mg Per Tube BID WC  . free water  200 mL Per Tube Q6H  . gabapentin  200 mg Per Tube BID  . mouth rinse  15 mL Mouth Rinse BID  . midodrine  10 mg Per Tube BID WC  . nutrition supplement (JUVEN)  1 packet Per Tube BID  . pantoprazole sodium  40 mg Per Tube QHS  . polyethylene glycol  17 g Per Tube q morning - 10a  . potassium chloride  40 mEq Per Tube TID AC & HS  . scopolamine  1 patch Transdermal Q72H  . sodium chloride flush  3 mL Intravenous Q12H  . tamsulosin  0.4 mg Oral Daily   Continuous Infusions: . dextrose 5 % and 0.45% NaCl 50 mL/hr at 07/06/20 1513  . feeding supplement (OSMOLITE 1.5 CAL) 1,000 mL (07/08/20 0505)  . sodium chloride irrigation      Active Problems:   Quadriparesis (HCC)   Quadriplegia and quadriparesis (HCC)   Aspiration pneumonia (HCC)   Protein-calorie malnutrition, severe   Hematuria   Sacral decubitus ulcer   COVID-19 virus infection 06/06/2020   UTI (urinary tract infection)   Acute  respiratory failure with hypoxia (HCC)     Delores Edelstein Orma Flamingublu Naijah Lacek, [facility]  If 7PM-7AM, please contact night-coverage [email or web address] Password [email or web address] 07/08/2020, 4:54 PM    LOS: 8 days

## 2020-07-09 DIAGNOSIS — E878 Other disorders of electrolyte and fluid balance, not elsewhere classified: Secondary | ICD-10-CM | POA: Diagnosis not present

## 2020-07-09 LAB — CBC
HCT: 27.2 % — ABNORMAL LOW (ref 39.0–52.0)
Hemoglobin: 8.5 g/dL — ABNORMAL LOW (ref 13.0–17.0)
MCH: 28.6 pg (ref 26.0–34.0)
MCHC: 31.3 g/dL (ref 30.0–36.0)
MCV: 91.6 fL (ref 80.0–100.0)
Platelets: 188 10*3/uL (ref 150–400)
RBC: 2.97 MIL/uL — ABNORMAL LOW (ref 4.22–5.81)
RDW: 14.6 % (ref 11.5–15.5)
WBC: 8.4 10*3/uL (ref 4.0–10.5)
nRBC: 0 % (ref 0.0–0.2)

## 2020-07-09 LAB — BASIC METABOLIC PANEL
Anion gap: 6 (ref 5–15)
BUN: 21 mg/dL (ref 8–23)
CO2: 30 mmol/L (ref 22–32)
Calcium: 7.5 mg/dL — ABNORMAL LOW (ref 8.9–10.3)
Chloride: 101 mmol/L (ref 98–111)
Creatinine, Ser: 0.32 mg/dL — ABNORMAL LOW (ref 0.61–1.24)
GFR, Estimated: >60 mL/min (ref >60)
Glucose, Bld: 142 mg/dL — ABNORMAL HIGH (ref 70–99)
Potassium: 5.5 mmol/L — ABNORMAL HIGH (ref 3.5–5.1)
Sodium: 137 mmol/L (ref 135–145)

## 2020-07-09 LAB — HEMOGLOBIN AND HEMATOCRIT, BLOOD
HCT: 24.2 % — ABNORMAL LOW (ref 39.0–52.0)
Hemoglobin: 7.8 g/dL — ABNORMAL LOW (ref 13.0–17.0)

## 2020-07-09 LAB — MAGNESIUM: Magnesium: 2 mg/dL (ref 1.7–2.4)

## 2020-07-09 LAB — GLUCOSE, CAPILLARY
Glucose-Capillary: 104 mg/dL — ABNORMAL HIGH (ref 70–99)
Glucose-Capillary: 88 mg/dL (ref 70–99)
Glucose-Capillary: 108 mg/dL — ABNORMAL HIGH (ref 70–99)
Glucose-Capillary: 112 mg/dL — ABNORMAL HIGH (ref 70–99)
Glucose-Capillary: 122 mg/dL — ABNORMAL HIGH (ref 70–99)

## 2020-07-09 LAB — PHOSPHORUS: Phosphorus: 3.2 mg/dL (ref 2.5–4.6)

## 2020-07-09 MED ORDER — SODIUM CHLORIDE 0.9% FLUSH
10.0000 mL | Freq: Two times a day (BID) | INTRAVENOUS | Status: DC
  Administered 2020-07-09 – 2020-07-11 (×5): 10 mL

## 2020-07-09 MED ORDER — K PHOS MONO-SOD PHOS DI & MONO 155-852-130 MG PO TABS
500.0000 mg | ORAL_TABLET | Freq: Two times a day (BID) | ORAL | Status: DC
  Administered 2020-07-09 – 2020-07-11 (×4): 500 mg via ORAL
  Filled 2020-07-09 (×4): qty 2

## 2020-07-09 MED ORDER — SODIUM CHLORIDE 0.9% FLUSH
10.0000 mL | INTRAVENOUS | Status: DC | PRN
Start: 2020-07-09 — End: 2020-07-11

## 2020-07-09 NOTE — Progress Notes (Signed)
PROGRESS NOTE    Shannon [name]  TKZ:[id number]  DOB: 1945-09-12  DOA: 06/30/2020 PCP: [name], MD Outpatient Specialists:   Hospital course:  Shannon [name] is a 75 y.o. 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  Then he had a fall at home developed epidural hematoma, underwent reexploration, removal of hardware, evacuation of hematoma on 10/30, now quadriplegic with dysphagia, unstageable sacral decubitus wounds, failure to thrive-with numerous recent hospitalizations for aspiration pneumonia/hematuria/UTI.   Patient was brought to the ED from SNF for evaluation of recurrent hematuria, cough.  In the ED, he briefly required up to 10 L of oxygen. Chest x-ray showed dense opacity at the right lung base. Urinalysis showed turbid brown urine. Patient was admitted for acute hypoxic respiratory due to aspiration pneumonia and complicated UTI. Urine culture sent on admission grew multidrug-resistant Achromobacter xylosoxidans  During the course of aspiration, patient was found to have significant dysphagia, worse compared to prior. PEG tube was placed and refeeding was complicated by refeeding syndrome.  Subjective:  Patient without any complaints today.  Notes he is sleepy.   Objective: Vitals:   07/09/20 0003 07/09/20 0211 07/09/20 0428 07/09/20 1146  BP: 97/67 104/76 102/64 (!) 108/59  Pulse: 91 84 85 97  Resp:  16 20 20   Temp:  98.2 F (36.8 C)  98.8 F (37.1 C)  TempSrc:  Oral  Oral  SpO2:  100% 100% 97%  Weight:      Height:        Intake/Output Summary (Last 24 hours) at 07/09/2020 1420 Last data filed at 07/09/2020 1300 Gross per 24 hour  Intake 8696.96 ml  Output 2725 ml  Net 5971.96 ml   Filed Weights   06/30/20 1143 07/07/20 0327  Weight: 106 kg 104.3 kg     Exam:  General: Quadriplegic patient lying in bed  sleeping, pleasant and cooperative when awakened by voice alone. Eyes: sclera anicteric, conjuctiva mild injection bilaterally CVS: S1-S2, regular  Respiratory:  decreased air entry bilaterally secondary to decreased inspiratory effort, rales at bases  GI: NABS, soft, NT, PEG tube CDI without discharge LE: Edema in all of his extremities. Neuro: A/O x 3, quadriplegic Psych: mood and affect appropriate to situation.   Assessment & Plan:   75 year old quadriplegic man with FTT and numerous recent hospitalization was admitted for SIRS secondary to aspiration pneumonia and complicated UTI.  Hospital course has been complicated by refeeding syndrome with severe hypophosphatemia after placement of PEG tube.  Refeeding syndrome with hypophosphatemia and hypomagnesemia Phosphorus now normalized after aggressive repletion. Magnesium also normalized Potassium is high normal at present.  Standing Klor-Con has was discontinued yesterday Will decrease K-Phos to twice daily from 3 times daily.  Chronic sacral decubitus ulcer stage IV - POA Appreciate surgery consultation, s/p + debridement 07/03/2020 Continue hydrotherapy with debridement per PT Continue Dilaudid 0.5 mg IV pretreatment for pain prior to hydrotherapy  SIRS secondary to complicated UTI and aspiration pneumonia Patient has completed 7-day course of broad-spectrum antibiotics with meropenem and vancomycin. Chest x-ray showed opacity in the right lung base with leukocytosis Patient has chronic dysphagia with chronic aspiration To new oxygen 2 L as needed, had previously been up to 10 L oxygen  Severe protein calorie malnutrition with chronic dysphagia Tube was placed 1/12 by IR Will need to be tack suture removal in 2 weeks Prescient ongoing nutrition consultation  Urinary retention  secondary to neurogenic bladder and BPH Patient has indwelling Foley which needs to be changed every 4 weeks Continue Flomax  Hypotension Chronic,  continue midodrine  Hypernatremia Resolved with free water flushes and IV fluid Continue D5 half-normal saline at 50 cc an hour   Previous medical problems now in active copied from Dr. Lorain Childes note from yesterday: Chronic aspiration -Due to chronic dysphagia. -CT chest from 1/11 showed complete opacification of the both lower lobes and subtotal opacification of right middle lobe.  There are layering debris's in the left mainstem and lower lobe bronchi.  These findings are suspicious for recurrent aspiration.   UTI associated with chronic Foley catheter -Urinalysis on admission showed turbid brown urine.   Urine culture grew more than 1000 CFU per mL of multidrug-resistant Achromobacter xylosoxidans.  History ofCOVID-19 infection: -Positive Covid PCR on admission is a sequelae of prior infection-doubt he requires any further treatment or airborne isolation for this issue. Continue to observe-his hypoxia is due to aspiration pneumonia.   Quadriplegia with dysphagia-cervical myelopathy-s/p C-spine surgery on 10/13-subsequently sustained a fall at home and developed epidural hematoma-underwent reexploration/removal of hardware and evacuation of hematoma on 10/30. Per prior documentation-plan is to leave cervical collar for 3 months total-till mid January. Continues to have significant quadriplegia (able to move upper extremity side-by-side-unable to lift off the bed against gravity, able to only wiggle toes-unable to lift lower extremities off the bed against gravity)  Swelling of both upper extremities -Multifactorial: Chronic immobility, low albumin level, compression bandages on arm and forearm.  Keep extremities elevated.  Multifactorial anemia-anemia of chronic disease/vitamin B12 deficiency/borderline iron deficiency/acute blood loss due to hematuria: -Hemoglobin at baseline-continue iron supplementation   DVT prophylaxis: Lovenox Code Status: Full Family  Communication: None today Disposition Plan:   Patient is from: SNF  Anticipated Discharge Location: SNF  Barriers to Discharge: Ongoing hypophosphatemia  Is patient medically stable for Discharge: He will be once his phosphates are normalized and the rest of his electrolytes stay normal   Consultants:  General surgery  Procedures:  Bedside debridement 07/03/2020  Antimicrobials:  None at present status post meropenem and vancomycin   Data Reviewed:  Basic Metabolic Panel: Recent Labs  Lab 07/04/20 0423 07/05/20 0229 07/06/20 0338 07/07/20 0340 07/08/20 0053 07/09/20 0422  NA 148* 149* 146* 145 144 137  K 3.1* 2.8* 2.9* 4.5 4.9 5.5*  CL 111 111 108 108 107 101  CO2 27 30 31  32 31 30  GLUCOSE 118* 164* 121* 138* 125* 142*  BUN 16 23 14 17 18 21   CREATININE 0.63 0.39* 0.34* 0.36* 0.35* 0.32*  CALCIUM 8.3* 8.1* 7.9* 8.1* 8.0* 7.5*  MG 1.6* 2.3  --  1.9 1.8 2.0  PHOS 1.5* 1.2* 2.0* 2.2* 1.8* 3.2   Liver Function Tests: Recent Labs  Lab 07/03/20 0849  AST 16  ALT 8  ALKPHOS 83  BILITOT 1.1  PROT 5.6*  ALBUMIN 1.3*   No results for input(s): LIPASE, AMYLASE in the last 168 hours. No results for input(s): AMMONIA in the last 168 hours. CBC: Recent Labs  Lab 07/03/20 0849 07/04/20 0423 07/05/20 0229 07/06/20 0338 07/07/20 0340 07/08/20 0053 07/09/20 0422 07/09/20 1331  WBC 11.9* 9.0 8.2 7.8 6.2 5.6 8.4  --   NEUTROABS 10.1* 6.8 6.1 5.6  --   --   --   --   HGB 8.8* 9.1* 9.3* 8.8* 9.2* 9.5* 8.5* 7.8*  HCT 29.3* 29.7* 31.3* 28.4* 31.0* 30.5* 27.2* 24.2*  MCV 95.1 94.3 96.0 93.4  94.8 93.3 91.6  --   PLT 216 240 230 190 188 195 188  --    Cardiac Enzymes: No results for input(s): CKTOTAL, CKMB, CKMBINDEX, TROPONINI in the last 168 hours. BNP (last 3 results) No results for input(s): PROBNP in the last 8760 hours. CBG: Recent Labs  Lab 07/08/20 2034 07/08/20 2326 07/09/20 0359 07/09/20 0814 07/09/20 1144  GLUCAP 119* 110* 104* 88 108*     Recent Results (from the past 240 hour(s))  Blood Culture (routine x 2)     Status: None   Collection Time: 06/30/20 10:05 AM   Specimen: BLOOD  Result Value Ref Range Status   Specimen Description BLOOD RIGHT ANTECUBITAL  Final   Special Requests   Final    BOTTLES DRAWN AEROBIC AND ANAEROBIC Blood Culture adequate volume   Culture   Final    NO GROWTH 5 DAYS Performed at [facility], [address] [place], [place] [address]    Report Status 07/05/2020 FINAL  Final  Blood Culture (routine x 2)     Status: None   Collection Time: 06/30/20 10:06 AM   Specimen: BLOOD LEFT FOREARM  Result Value Ref Range Status   Specimen Description BLOOD LEFT FOREARM  Final   Special Requests   Final    BOTTLES DRAWN AEROBIC AND ANAEROBIC Blood Culture adequate volume   Culture   Final    NO GROWTH 5 DAYS Performed at [facility], [address] [place], [place] [address]    Report Status 07/05/2020 FINAL  Final  Urine culture     Status: Abnormal   Collection Time: 06/30/20 10:47 AM   Specimen: In/Out Cath Urine  Result Value Ref Range Status   Specimen Description IN/OUT CATH URINE  Final   Special Requests   Final    NONE Performed at [facility], [address] [place], [place] [address]    Culture >=100,000 COLONIES/mL ACHROMOBACTER XYLOSOXIDANS (A)  Final   Report Status 07/02/2020 FINAL  Final   Organism ID, Bacteria ACHROMOBACTER XYLOSOXIDANS (A)  Final      Susceptibility   Achromobacter xylosoxidans - MIC*    CEFAZOLIN >=64 RESISTANT Resistant     GENTAMICIN >=16 RESISTANT Resistant     CIPROFLOXACIN >=4 RESISTANT Resistant     IMIPENEM 1 SENSITIVE Sensitive     TRIMETH/SULFA <=20 SENSITIVE Sensitive     * >=100,000 COLONIES/mL ACHROMOBACTER XYLOSOXIDANS  MRSA PCR Screening     Status: None   Collection Time: 07/04/20 12:54 PM   Specimen: Nasal Mucosa; Nasopharyngeal  Result Value Ref Range Status   MRSA by PCR NEGATIVE NEGATIVE Final     Comment:        The GeneXpert MRSA Assay (FDA approved for NASAL specimens only), is one component of a comprehensive MRSA colonization surveillance program. It is not intended to diagnose MRSA infection nor to guide or monitor treatment for MRSA infections. Performed at [facility], [address] [place], [place] [address]       Studies: No results found.   Scheduled Meds: . acetaminophen  1,000 mg Per Tube TID  . ascorbic acid  1,000 mg Per Tube Daily  . Chlorhexidine Gluconate Cloth  6 each Topical Daily  . citalopram  5 mg Per Tube Daily  . collagenase   Topical Daily  . enoxaparin (LOVENOX) injection  40 mg Subcutaneous Q24H  . feeding supplement (PROSource TF)  45 mL Per Tube TID  . ferrous  sulfate  300 mg Per Tube BID WC  . free water  200 mL Per Tube Q6H  . gabapentin  200 mg Per Tube BID  . mouth rinse  15 mL Mouth Rinse BID  . midodrine  10 mg Per Tube BID WC  . nutrition supplement (JUVEN)  1 packet Per Tube BID  . pantoprazole sodium  40 mg Per Tube QHS  . phosphorus  500 mg Oral TID  . polyethylene glycol  17 g Per Tube q morning - 10a  . scopolamine  1 patch Transdermal Q72H  . sodium chloride flush  10-40 mL Intracatheter Q12H  . sodium chloride flush  3 mL Intravenous Q12H  . tamsulosin  0.4 mg Oral Daily   Continuous Infusions: . dextrose 5 % and 0.45% NaCl 50 mL/hr at 07/08/20 1753  . feeding supplement (OSMOLITE 1.5 CAL) 1,000 mL (07/08/20 0505)  . sodium chloride irrigation      Active Problems:   Quadriparesis (HCC)   Quadriplegia and quadriparesis (HCC)   Aspiration pneumonia (HCC)   Protein-calorie malnutrition, severe   Hematuria   Sacral decubitus ulcer   COVID-19 virus infection 06/06/2020   UTI (urinary tract infection)   Acute respiratory failure with hypoxia (HCC)     Shannon [name] Orma Flaming, [facility]  If 7PM-7AM, please contact night-coverage [email or web address] Password [email or web address] 07/09/2020, 2:20 PM    LOS: 9  days

## 2020-07-10 DIAGNOSIS — L89154 Pressure ulcer of sacral region, stage 4: Secondary | ICD-10-CM | POA: Diagnosis not present

## 2020-07-10 DIAGNOSIS — J9601 Acute respiratory failure with hypoxia: Secondary | ICD-10-CM | POA: Diagnosis not present

## 2020-07-10 DIAGNOSIS — E43 Unspecified severe protein-calorie malnutrition: Secondary | ICD-10-CM | POA: Diagnosis not present

## 2020-07-10 DIAGNOSIS — J690 Pneumonitis due to inhalation of food and vomit: Secondary | ICD-10-CM | POA: Diagnosis not present

## 2020-07-10 LAB — GLUCOSE, CAPILLARY
Glucose-Capillary: 122 mg/dL — ABNORMAL HIGH (ref 70–99)
Glucose-Capillary: 113 mg/dL — ABNORMAL HIGH (ref 70–99)
Glucose-Capillary: 135 mg/dL — ABNORMAL HIGH (ref 70–99)
Glucose-Capillary: 128 mg/dL — ABNORMAL HIGH (ref 70–99)
Glucose-Capillary: 122 mg/dL — ABNORMAL HIGH (ref 70–99)
Glucose-Capillary: 125 mg/dL — ABNORMAL HIGH (ref 70–99)
Glucose-Capillary: 111 mg/dL — ABNORMAL HIGH (ref 70–99)

## 2020-07-10 LAB — BASIC METABOLIC PANEL
Anion gap: 7 (ref 5–15)
BUN: 22 mg/dL (ref 8–23)
CO2: 30 mmol/L (ref 22–32)
Calcium: 7.6 mg/dL — ABNORMAL LOW (ref 8.9–10.3)
Chloride: 101 mmol/L (ref 98–111)
Creatinine, Ser: 0.34 mg/dL — ABNORMAL LOW (ref 0.61–1.24)
GFR, Estimated: >60 mL/min (ref >60)
Glucose, Bld: 133 mg/dL — ABNORMAL HIGH (ref 70–99)
Potassium: 4.5 mmol/L (ref 3.5–5.1)
Sodium: 138 mmol/L (ref 135–145)

## 2020-07-10 LAB — CBC
HCT: 22.7 % — ABNORMAL LOW (ref 39.0–52.0)
Hemoglobin: 7.5 g/dL — ABNORMAL LOW (ref 13.0–17.0)
MCH: 29.4 pg (ref 26.0–34.0)
MCHC: 33 g/dL (ref 30.0–36.0)
MCV: 89 fL (ref 80.0–100.0)
Platelets: 195 10*3/uL (ref 150–400)
RBC: 2.55 MIL/uL — ABNORMAL LOW (ref 4.22–5.81)
RDW: 14.6 % (ref 11.5–15.5)
WBC: 7 10*3/uL (ref 4.0–10.5)
nRBC: 0 % (ref 0.0–0.2)

## 2020-07-10 LAB — MAGNESIUM: Magnesium: 1.9 mg/dL (ref 1.7–2.4)

## 2020-07-10 LAB — PHOSPHORUS: Phosphorus: 2.9 mg/dL (ref 2.5–4.6)

## 2020-07-10 NOTE — Progress Notes (Signed)
  Speech Language Pathology Treatment: Dysphagia  Patient Details Name: Jaquese Irving MRN: [id number] DOB: 12/16/1945 Today's Date: 07/10/2020 Time: 9833-8250 SLP Time Calculation (min) (ACUTE ONLY): 19 min  Assessment / Plan / Recommendation Clinical Impression  Pt was seen for dysphagia treatment and was cooperative during the session. Oral care was provided and he was seated upright for the session. No s/sx of aspiration were noted with thin liquids via straw or with ice chips. Three secondary swallows were noted with the initial bolus of thin liquids via straw, but this was not subsequently demonstrated. Pt refused all other solids/liquids stating "that's enough for now". Pt's swallow function appears improved compared to his performance on 07/03/19. SLP will continue to follow pt to further assess swallow function and to determine the pt's and team's willingness to initiate anything p.o.   HPI HPI: Madyx Delfin is a 75 y.o. 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  He originally had a C3-4 Watson 4-5 anterior cervical disc ACDF with decompression, C3 3-4 and C4-5 interbody arthrodesis with local autograft bone, anterior cervical plating of C3-C5 on 04/05/2020.  He then went home and had a fall from his bed, he was then hospitalized and from here he was sent to a nursing home, he has been quadriplegic since then, he has also developed sacral decubitus ulcers. Was seen for MBS on 05/04/20 "deficits resulted in silent aspiration of thin liquid and nectar thick liquid by straw prior to the swallow. With nectar thick liquid by cup, only transient penetration was seen. There was significant pharyngeal residue with puree and solid consistencies which was reduced but not fully cleared with liquid wash." Pt was then admitted for AMS due to UTI, sepsis and hypotension and underwent mbs with normal swallow.  Pt now admitted with hematuria - Also diagnosed with COVID 06/11/2020.  MD ordered swallow eval currently due to"suspicion for aspiration pna".  Pt has remained on precautions due to his continued cough. Pt reports he only coughs when he is being moved around.      SLP Plan  Continue with current plan of care       Recommendations  Diet recommendations: NPO Medication Administration: Via alternative means                Oral Care Recommendations: Oral care QID Follow up Recommendations: Skilled Nursing facility SLP Visit Diagnosis: Dysphagia, unspecified (R13.10) Plan: Continue with current plan of care       Angeligue Bowne I. Vear Clock, MS, CCC-SLP Acute Rehabilitation Services Office number [phone] Pager [phone]               Scheryl Marten 07/10/2020, 12:42 PM

## 2020-07-10 NOTE — Progress Notes (Signed)
Progress Note    Shannon [name]  ZOX:[id number] DOB: [birth year]  DOA: 06/30/2020 PCP: Jeoffrey MassedMcGowen, Philip H, MD      Brief Narrative:    Medical records reviewed and are as summarized below:  Shannon Otaonald Ray Nazaire is a 75 y.o. male       Assessment/Plan:   Active Problems:   Quadriparesis (HCC)   Quadriplegia and quadriparesis (HCC)   Aspiration pneumonia (HCC)   Protein-calorie malnutrition, severe   Hematuria   Sacral decubitus ulcer   COVID-19 virus infection 06/06/2020   UTI (urinary tract infection)   Acute respiratory failure with hypoxia (HCC)   Nutrition Problem: Severe Malnutrition Etiology: chronic illness (chronic wounds)  Signs/Symptoms: energy intake < or equal to 75% for > or equal to 1 month,severe fat depletion,severe muscle depletion   Body mass index is 31.19 kg/m.     Refeeding syndrome with hypophosphatemia and hypomagnesemia Phosphorus and magnesium levels improved.  Continue supplementation and monitor closely.  Chronic sacral decubitus ulcer stage IV - POA Appreciate surgery consultation.  Per surgeon, no further debridement required. Continue hydrotherapy. Continue Dilaudid 0.5 mg IV pretreatment for pain prior to hydrotherapy  SIRS secondary to complicated UTI and aspiration pneumonia Patient has completed 7-day course of broad-spectrum antibiotics with meropenem and vancomycin. Chest x-ray showed opacity in the right lung base with leukocytosis Patient has chronic dysphagia with chronic aspiration On oxygen 2 L as needed, had previously been up to 10 L oxygen Taper off oxygen as able  Severe protein calorie malnutrition with chronic dysphagia PEG tube was placed on 07/05/2020 by interventional radiologist. 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 Per prior documentation-plan is to leave cervical collar for 3 months total-till mid January.   Swelling of both upper extremities -Multifactorial: Chronic immobility, low albumin level, compression bandages on arm and forearm. Keep extremities elevated.  Multifactorial anemia-anemia of chronic disease/vitamin B12 deficiency/borderline iron deficiency/acute blood loss due to hematuria: -Hemoglobin at baseline-continue iron supplementation               Diet Order            Diet NPO time specified Except for: Sips with Meds  Diet effective midnight                    Consultants:  Interventional radiologist  General surgeon  Procedures:  PEG tube placement by IR on 07/05/2020    Medications:   . acetaminophen  1,000 mg Per Tube TID  . ascorbic acid  1,000 mg Per Tube Daily  . Chlorhexidine Gluconate Cloth  6 each Topical Daily  . citalopram  5 mg Per Tube Daily  . collagenase   Topical Daily  . enoxaparin (LOVENOX) injection  40 mg Subcutaneous Q24H  . feeding supplement (PROSource TF)  45 mL Per Tube TID  . ferrous sulfate  300 mg Per Tube BID WC  . free water  200 mL Per Tube Q6H  . gabapentin  200 mg Per Tube BID  .  mouth rinse  15 mL Mouth Rinse BID  . midodrine  10 mg Per Tube BID WC  . nutrition supplement (JUVEN)  1 packet Per Tube BID  . pantoprazole sodium  40 mg Per Tube QHS  . phosphorus  500 mg Oral BID  . polyethylene glycol  17 g Per Tube q morning - 10a  . scopolamine  1 patch Transdermal Q72H  . sodium  chloride flush  10-40 mL Intracatheter Q12H  . sodium chloride flush  3 mL Intravenous Q12H  . tamsulosin  0.4 mg Oral Daily   Continuous Infusions: . dextrose 5 % and 0.45% NaCl 50 mL/hr at 07/10/20 0930  . feeding supplement (OSMOLITE 1.5 CAL) 1,000 mL (07/10/20 1203)  . sodium chloride irrigation       Anti-infectives (From admission, onward)   Start     Dose/Rate Route Frequency Ordered Stop   07/04/20 1600  vancomycin (VANCOREADY) IVPB 1500 mg/300 mL  Status:  Discontinued        1,500 mg 150 mL/hr over 120 Minutes Intravenous Every 24 hours 07/03/20 0904 07/04/20 0900   07/04/20 1000  vancomycin (VANCOREADY) IVPB 1500 mg/300 mL  Status:  Discontinued        1,500 mg 150 mL/hr over 120 Minutes Intravenous Every 24 hours 07/04/20 0900 07/05/20 1054   07/01/20 0900  vancomycin (VANCOREADY) IVPB 1500 mg/300 mL  Status:  Discontinued        1,500 mg 150 mL/hr over 120 Minutes Intravenous Every 12 hours 07/01/20 0843 07/03/20 0904   06/30/20 2000  meropenem (MERREM) 2 g in sodium chloride 0.9 % 100 mL IVPB        2 g 200 mL/hr over 30 Minutes Intravenous Every 8 hours 06/30/20 1303 07/06/20 2359   06/30/20 1115  meropenem (MERREM) 2 g in sodium chloride 0.9 % 100 mL IVPB        2 g 200 mL/hr over 30 Minutes Intravenous  Once 06/30/20 1036 06/30/20 1241             Family Communication/Anticipated D/C date and plan/Code Status   DVT prophylaxis: enoxaparin (LOVENOX) injection 40 mg Start: 07/06/20 1200 SCDs Start: 06/30/20 1310     Code Status: Full Code  Family Communication: None Disposition Plan:    Status is: Inpatient  Remains inpatient appropriate because:Inpatient level of care appropriate due to severity of illness   Dispo: The patient is from: SNF              Anticipated d/c is to: SNF              Anticipated d/c date is: 1 day              Patient currently is medically stable to d/c.           Subjective:   Interval events noted.  He  has no complaints.  Objective:    Vitals:   07/10/20 0033 07/10/20 0424 07/10/20 0700 07/10/20 1100  BP: (!) 100/58 124/68 127/63 116/63  Pulse: 92 87 82 71  Resp: 20 20 13 11   Temp: 98.2 F (36.8 C) 98 F (36.7 C) 98.2 F (36.8 C) 98 F (36.7 C)  TempSrc: Oral  Oral Oral  SpO2: 100% 100% 100% 100%  Weight:      Height:       No data found.   Intake/Output Summary (Last 24 hours) at 07/10/2020 1617 Last data filed at 07/09/2020 2136 Gross per 24 hour  Intake 1125  ml  Output --  Net 1125 ml   Filed Weights   06/30/20 1143 07/07/20 0327  Weight: 106 kg 104.3 kg    Exam:  GEN: NAD SKIN: Stage IV 4 sacral decubitus ulcer EYES: EOMI ENT: MMM CV: RRR PULM: CTA B ABD: soft, ND, NT, +BS CNS: AAO x 3, quadriplegic EXT: No edema or tenderness    Data Reviewed:   I have personally reviewed following labs and imaging studies:  Labs: Labs show the following:   Basic Metabolic Panel: Recent Labs  Lab 07/05/20 0229 07/06/20 0338 07/07/20 0340 07/08/20 0053 07/09/20 0422 07/10/20 0120  NA 149* 146* 145 144 137 138  K 2.8* 2.9* 4.5 4.9 5.5* 4.5  CL 111 108 108 107 101 101  CO2 30 31 32 31 30 30   GLUCOSE 164* 121* 138* 125* 142* 133*  BUN 23 14 17 18 21 22   CREATININE 0.39* 0.34* 0.36* 0.35* 0.32* 0.34*  CALCIUM 8.1* 7.9* 8.1* 8.0* 7.5* 7.6*  MG 2.3  --  1.9 1.8 2.0 1.9  PHOS 1.2* 2.0* 2.2* 1.8* 3.2 2.9   GFR Estimated Creatinine Clearance: 101.2 mL/min (A) (by C-G formula based on SCr of 0.34 mg/dL (L)). Liver Function Tests: No results for input(s): AST, ALT, ALKPHOS, BILITOT, PROT, ALBUMIN in the last 168 hours. No results for input(s): LIPASE, AMYLASE in the last 168 hours. No results for input(s): AMMONIA in the last 168 hours. Coagulation profile No results for input(s): INR, PROTIME in the last 168 hours.  CBC: Recent Labs  Lab 07/04/20 0423 07/05/20 0229 07/06/20 0338 07/07/20 0340 07/08/20 0053 07/09/20 0422 07/09/20 1331  07/10/20 0120  WBC 9.0 8.2 7.8 6.2 5.6 8.4  --  7.0  NEUTROABS 6.8 6.1 5.6  --   --   --   --   --   HGB 9.1* 9.3* 8.8* 9.2* 9.5* 8.5* 7.8* 7.5*  HCT 29.7* 31.3* 28.4* 31.0* 30.5* 27.2* 24.2* 22.7*  MCV 94.3 96.0 93.4 94.8 93.3 91.6  --  89.0  PLT 240 230 190 188 195 188  --  195   Cardiac Enzymes: No results for input(s): CKTOTAL, CKMB, CKMBINDEX, TROPONINI in the last 168 hours. BNP (last 3 results) No results for input(s): PROBNP in the last 8760 hours. CBG: Recent Labs  Lab 07/09/20 1559 07/09/20 2130 07/10/20 0421 07/10/20 0822 07/10/20 1157  GLUCAP 112* 122* 122* 113* 135*   D-Dimer: No results for input(s): DDIMER in the last 72 hours. Hgb A1c: No results for input(s): HGBA1C in the last 72 hours. Lipid Profile: No results for input(s): CHOL, HDL, LDLCALC, TRIG, CHOLHDL, LDLDIRECT in the last 72 hours. Thyroid function studies: No results for input(s): TSH, T4TOTAL, T3FREE, THYROIDAB in the last 72 hours.  Invalid input(s): FREET3 Anemia work up: No results for input(s): VITAMINB12, FOLATE, FERRITIN, TIBC, IRON, RETICCTPCT in the last 72 hours. Sepsis Labs: Recent Labs  Lab 07/07/20 0340 07/08/20 0053 07/09/20 0422 07/10/20 0120  WBC 6.2 5.6 8.4 7.0    Microbiology Recent Results (from the past 240 hour(s))  MRSA PCR Screening     Status: None   Collection Time: 07/04/20 12:54 PM   Specimen: Nasal Mucosa; Nasopharyngeal  Result Value Ref Range Status   MRSA by PCR NEGATIVE NEGATIVE Final    Comment:        The GeneXpert MRSA Assay (FDA approved for NASAL specimens only), is one component of a comprehensive MRSA colonization surveillance program. It is not intended to diagnose MRSA infection nor to guide  or monitor treatment for MRSA infections. Performed at [facility], [address], [place], [place] [address]     Procedures and diagnostic studies:  No results found.             LOS: 10 days   Alexa Blish  [facility]   Pager on [email or web address]. If 7PM-7AM, please contact night-coverage at [email or web address]     07/10/2020, 4:17 PM

## 2020-07-10 NOTE — Plan of Care (Signed)
  Problem: Health Behavior/Discharge Planning: Goal: Ability to manage health-related needs will improve Outcome: Not Progressing   

## 2020-07-10 NOTE — Progress Notes (Signed)
Physical Therapy Wound Treatment Patient Details  Name: Shannon [name] MRN: [id number] Date of Birth: 1946/02/21  Today's Date: 07/10/2020 Time: 4098-1191 Time Calculation (min): 28 min  Subjective  Subjective: "I just hurt everywhere." Patient and Family Stated Goals: did not state Date of Onset:  (unknown) Prior Treatments: Dressing change, hydrotherapy prior admission 05/2020, s/p bedside debridement 07/04/2019  Pain Score:  2/10  Wound Assessment  Pressure Injury 06/22/20 Sacrum Posterior Stage 4 - Full thickness tissue loss with exposed bone, tendon or muscle. PHYSICAL THERAPY/HYDROTHERAPY (Active)  Dressing Type ABD;Barrier Film (skin prep);Gauze (Comment);Moist to dry 07/10/20 1113  Dressing Changed;Clean;Dry;Intact 07/10/20 1113  Dressing Change Frequency Twice a day 07/10/20 1113  State of Healing Early/partial granulation 07/10/20 1113  Site / Wound Assessment Yellow;Pink;Red;Brown 07/10/20 1113  % Wound base Red or Granulating 30% 07/10/20 1113  % Wound base Yellow/Fibrinous Exudate 20% 07/10/20 1113  % Wound base Black/Eschar 35% 07/10/20 1113  % Wound base Other/Granulation Tissue (Comment) 15% 07/10/20 1113  Peri-wound Assessment Erythema (non-blanchable);Pink 07/10/20 1113  Wound Length (cm) 11 cm 07/04/20 1355  Wound Width (cm) 11 cm 07/04/20 1355  Wound Depth (cm) 2 cm 07/04/20 1355  Wound Surface Area (cm^2) 121 cm^2 07/04/20 1355  Wound Volume (cm^3) 242 cm^3 07/04/20 1355  Undermining (cm) 1-2 cm for ~ 9* to 12* 07/08/20 1109  Margins Unattached edges (unapproximated) 07/10/20 1113  Drainage Amount Moderate 07/10/20 1113  Drainage Description Serosanguineous 07/10/20 1113  Treatment Debridement (Selective);Hydrotherapy (Pulse lavage);Packing (Saline gauze) 07/10/20 1113   Hydrotherapy Pulsed lavage therapy - wound location: sacrum Pulsed Lavage with Suction (psi): 8 psi (4-8) Pulsed Lavage with Suction - Normal Saline Used: 1000 mL Pulsed Lavage Tip: Tip  with splash shield Selective Debridement Selective Debridement - Location: Sacrum Selective Debridement - Tools Used: Forceps;Scissors Selective Debridement - Tissue Removed: slough   Wound Assessment and Plan  Wound Therapy - Assess/Plan/Recommendations Wound Therapy - Clinical Statement: Pt with seemingly improved pain control today. Wound bed continuing to improve in appearance.  Pt will benefit from hydrotherapy for selective debridement and to decrease bioburden. Wound Therapy - Functional Problem List: Decreased mobility and global weakness Factors Delaying/Impairing Wound Healing: Immobility Hydrotherapy Plan: Debridement;Dressing change;Patient/family education;Pulsatile lavage with suction Wound Therapy - Frequency: 6X / week Wound Therapy - Follow Up Recommendations: Skilled nursing facility Wound Plan: See above  Wound Therapy Goals- Improve the function of patient's integumentary system by progressing the wound(s) through the phases of wound healing (inflammation - proliferation - remodeling) by: Decrease Necrotic Tissue to: 50 Decrease Necrotic Tissue - Progress: Progressing toward goal Increase Granulation Tissue to: 50 Increase Granulation Tissue - Progress: Progressing toward goal Goals/treatment plan/discharge plan were made with and agreed upon by patient/family: Yes Time For Goal Achievement: 7 days Wound Therapy - Potential for Goals: Fair  Goals will be updated until maximal potential achieved or discharge criteria met.  Discharge criteria: when goals achieved, discharge from hospital, MD decision/surgical intervention, no progress towards goals, refusal/missing three consecutive treatments without notification or medical reason.  GP     Wyona Almas, PT, DPT Acute Rehabilitation Services Pager [phone] Office [phone]   Deno Etienne 07/10/2020, 11:17 AM

## 2020-07-11 DIAGNOSIS — E43 Unspecified severe protein-calorie malnutrition: Secondary | ICD-10-CM | POA: Diagnosis not present

## 2020-07-11 DIAGNOSIS — J9601 Acute respiratory failure with hypoxia: Secondary | ICD-10-CM | POA: Diagnosis not present

## 2020-07-11 DIAGNOSIS — J690 Pneumonitis due to inhalation of food and vomit: Secondary | ICD-10-CM | POA: Diagnosis not present

## 2020-07-11 LAB — BASIC METABOLIC PANEL
Anion gap: 7 (ref 5–15)
BUN: 18 mg/dL (ref 8–23)
CO2: 30 mmol/L (ref 22–32)
Calcium: 7.5 mg/dL — ABNORMAL LOW (ref 8.9–10.3)
Chloride: 100 mmol/L (ref 98–111)
Creatinine, Ser: 0.35 mg/dL — ABNORMAL LOW (ref 0.61–1.24)
GFR, Estimated: >60 mL/min (ref >60)
Glucose, Bld: 127 mg/dL — ABNORMAL HIGH (ref 70–99)
Potassium: 4.4 mmol/L (ref 3.5–5.1)
Sodium: 137 mmol/L (ref 135–145)

## 2020-07-11 LAB — CBC
HCT: 23.7 % — ABNORMAL LOW (ref 39.0–52.0)
Hemoglobin: 7.4 g/dL — ABNORMAL LOW (ref 13.0–17.0)
MCH: 29 pg (ref 26.0–34.0)
MCHC: 31.2 g/dL (ref 30.0–36.0)
MCV: 92.9 fL (ref 80.0–100.0)
Platelets: 200 10*3/uL (ref 150–400)
RBC: 2.55 MIL/uL — ABNORMAL LOW (ref 4.22–5.81)
RDW: 15.5 % (ref 11.5–15.5)
WBC: 6.8 10*3/uL (ref 4.0–10.5)
nRBC: 0.3 % — ABNORMAL HIGH (ref 0.0–0.2)

## 2020-07-11 LAB — GLUCOSE, CAPILLARY
Glucose-Capillary: 121 mg/dL — ABNORMAL HIGH (ref 70–99)
Glucose-Capillary: 108 mg/dL — ABNORMAL HIGH (ref 70–99)
Glucose-Capillary: 114 mg/dL — ABNORMAL HIGH (ref 70–99)

## 2020-07-11 LAB — MAGNESIUM: Magnesium: 1.8 mg/dL (ref 1.7–2.4)

## 2020-07-11 LAB — RESP PANEL BY RT-PCR (FLU A&B, COVID) ARPGX2
Influenza A by PCR: NEGATIVE
Influenza B by PCR: NEGATIVE
SARS Coronavirus 2 by RT PCR: NEGATIVE

## 2020-07-11 LAB — PHOSPHORUS: Phosphorus: 2.9 mg/dL (ref 2.5–4.6)

## 2020-07-11 MED ORDER — SODIUM CHLORIDE 0.9 % IV SOLN
200.0000 mg | Freq: Once | INTRAVENOUS | Status: AC
  Administered 2020-07-11: 200 mg via INTRAVENOUS
  Filled 2020-07-11: qty 10

## 2020-07-11 MED ORDER — OSMOLITE 1.5 CAL PO LIQD
237.0000 mL | Freq: Every day | ORAL | Status: DC
  Administered 2020-07-11: 237 mL
  Filled 2020-07-11 (×3): qty 237

## 2020-07-11 MED ORDER — OXYCODONE HCL 5 MG PO TABS: 2.5000 mg | ORAL_TABLET | Freq: Every day | ORAL | 0 refills | Status: DC | PRN

## 2020-07-11 MED ORDER — OSMOLITE 1.5 CAL PO LIQD: 237.0000 mL | Freq: Every day | ORAL | 0 refills | Status: DC

## 2020-07-11 MED ORDER — COLLAGENASE 250 UNIT/GM EX OINT: TOPICAL_OINTMENT | Freq: Every day | CUTANEOUS | 0 refills | Status: AC

## 2020-07-11 NOTE — Progress Notes (Signed)
Brief Nutrition Follow Up  Plan for patient discharge to SNF.   Currently receiving Osmolite 1.5 @ 60 ml/hr + ProSource TF 45 ml BID. Noted ongoing need for flexiseal and ~500 ml output over the last 24 hrs. Electrolytes have stabilized. Transition to bolus feedings today. Hopeful to see improvement in diarrhea with this change.   Tube feeding regimen:  - 1 can/ARC Osmolite 1.5 six times daily (can provide 2 at a time if able to tolerate) - 45 ml Prosource or other protein modular TID -100 ml free water flushes before and after each feeding  Provides: 2250 kcals, 123 grams protein, 1086 ml free water (2286 ml with flushes).   Vanessa Kick RD, LDN Clinical Nutrition Pager listed in AMION

## 2020-07-11 NOTE — TOC Transition Note (Signed)
Transition of Care [facility]) - CM/SW Discharge Note   Patient Details  Name: Shannon [name] MRN: [id number] Date of Birth: Feb 01, 1946  [facility] CM/SW Contact:  Lorri Frederick, LCSW Phone Number: 07/11/2020, 1:30 PM   Clinical Narrative:   Pt discharging to [facility], Room 604[facility].  RN call report to [phone].  [facility] to transport.    Final next level of care: Skilled Nursing Facility Barriers to Discharge: Barriers Resolved   Patient Goals and CMS Choice   CMS Medicare.gov Compare Post Acute Care list provided to::  (daughter wants to continue at [facility] when pt is medically ready)    Discharge Placement              Patient chooses bed at: [facility] Patient to be transferred to facility by: [facility] Name of family member notified: niece, [name] Patient and family notified of of transfer: 07/11/20  Discharge Plan and Services     Post Acute Care Choice: Skilled Nursing Facility                               Social Determinants of Health (SDOH) Interventions     Readmission Risk Interventions No flowsheet data found.

## 2020-07-11 NOTE — Progress Notes (Signed)
Patient discharged with transport to [facility]. Rectal tube and midline removed prior to discharge. Instructed to leave foley in place as it is chronic. Patient stable at time of discharge.

## 2020-07-11 NOTE — Discharge Summary (Addendum)
Physician Discharge Summary  Shannon [name] FYB:[id number] DOB: 08-Aug-1945 DOA: 06/30/2020  PCP: [name], MD  Admit date: 06/30/2020 Discharge date: 07/11/2020  Discharge disposition: Skilled nursing facility   Recommendations for Outpatient Follow-Up:   Follow-up with physician at the skilled nursing facility within 3 days of discharge   Discharge Diagnosis:   Active Problems:   Quadriparesis [facility])   Quadriplegia and quadriparesis (HCC)   Aspiration pneumonia (HCC)   Protein-calorie malnutrition, severe   Hematuria   Sacral decubitus ulcer   COVID-19 virus infection 06/06/2020   UTI (urinary tract infection)   Acute respiratory failure with hypoxia (HCC)    Discharge Condition: Stable.  Diet recommendation:  Diet Order            Diet NPO time specified Except for: Sips with Meds  Diet effective midnight                   Code Status: Full Code     Hospital Course:    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 Then he had a fall at home developed epidural hematoma, underwent reexploration, removal of hardware, evacuation of hematoma on 10/30, now quadriplegic with dysphagia, unstageable sacral decubitus wounds, failure to thrive-with numerous recent hospitalizations for aspiration pneumonia/hematuria/UTI.  Patient was brought to the ED from SNF for evaluation of recurrent hematuria, cough.  In the ED, he briefly required up to 10 L of oxygen. Chest x-ray showed dense opacity at the right lung base. Urinalysis showed turbid brown urine. Patient was admitted for acute hypoxic respiratory due to aspiration pneumonia and complicated UTI. Urine culture sent on  admission grew multidrug-resistant Achromobacter xylosoxidans  During the course of aspiration, patient was found to have significant dysphagia, worse compared to prior. PEG tube was placed and refeeding was complicated by refeeding syndrome.  He had hypomagnesemia, hypophosphatemia and hypernatremia that were corrected.  He was seen by the general surgeon who for sacral decubitus ulcer.  Local wound care including hydrotherapy was performed.  He was given IV iron (for iron deficiency anemia) on the day of discharge.  His condition has improved and he is deemed stable for discharge to SNF today.    Medical Consultants:    Interventional radiologist for PEG tube  General surgeon   Discharge Exam:    Vitals:   07/11/20 0312 07/11/20 0556 07/11/20 0700 07/11/20 1200  BP: (!) 128/51  (!) 124/56 107/63  Pulse: 88  92 88  Resp: 18  18 16   Temp: 98 F (36.7 C)  98.3 F (36.8 C) 98.8 F (37.1 C)  TempSrc: Oral  Axillary Axillary  SpO2: 100%  100% 100%  Weight:  104 kg    Height:         GEN: NAD SKIN: Stage IV sacral decubitus ulcer EYES: EOMI ENT: MMM.  Cervical collar in place CV: RRR PULM: CTA B ABD: soft, ND, NT, +BS, + PEG tube CNS: AAO x 3, quadriplegic EXT: Swelling of bilateral upper extremities.  No erythema or tenderness GU: Foley catheter draining amber urine    The results of significant diagnostics from this hospitalization (including imaging, microbiology, ancillary and laboratory) are listed below for reference.     Procedures and Diagnostic Studies:   DG Chest Portable 1 View  Result Date: 06/30/2020 CLINICAL DATA:  COVID-19 positive patient with productive cough. EXAM: PORTABLE CHEST 1 VIEW COMPARISON:  Single-view of the chest 06/19/2020 and 06/06/2020. FINDINGS: Right pleural effusion and basilar airspace disease have markedly worsened. The patient has a very small left pleural effusion and mild basilar airspace disease. No pneumothorax. Heart size  normal. IMPRESSION: Marked worsening of a right pleural effusion which appears moderate in size and right basilar airspace disease. Trace left pleural effusion is noted. Electronically Signed   By: Drusilla Kannerhomas  Dalessio M.D.   On: 06/30/2020 10:00   DG Chest Port 1V same Day  Result Date: 07/01/2020 CLINICAL DATA:  Shortness of breath.  COVID positive. EXAM: PORTABLE CHEST 1 VIEW COMPARISON:  Chest x-ray dated 06/30/2020. FINDINGS: Heart size and mediastinal contours are stable. Small pleural effusion and/or atelectasis at the LEFT lung base. Stable opacity at the RIGHT lung base, moderate-sized pleural effusion and/or atelectasis or pneumonia. No pneumothorax is seen. IMPRESSION: 1. Dense opacity at the RIGHT lung base, stable compared to most recent chest x-ray of 06/30/2020, compatible with moderate-sized pleural effusion with underlying atelectasis or pneumonia. 2. Small pleural effusion and/or atelectasis at the LEFT lung base. Electronically Signed   By: Bary RichardStan  Maynard M.D.   On: 07/01/2020 07:54     Labs:   Basic Metabolic Panel: Recent Labs  Lab 07/07/20 0340 07/08/20 0053 07/09/20 0422 07/10/20 0120 07/11/20 0431  NA 145 144 137 138 137  K 4.5 4.9 5.5* 4.5 4.4  CL 108 107 101 101 100  CO2 32 31 30 30 30   GLUCOSE 138* 125* 142* 133* 127*  BUN 17 18 21 22 18   CREATININE 0.36* 0.35* 0.32* 0.34* 0.35*  CALCIUM 8.1* 8.0* 7.5* 7.6* 7.5*  MG 1.9 1.8 2.0 1.9 1.8  PHOS 2.2* 1.8* 3.2 2.9 2.9   GFR Estimated Creatinine Clearance: 101.1 mL/min (A) (by C-G formula based on SCr of 0.35 mg/dL (L)). Liver Function Tests: No results for input(s): AST, ALT, ALKPHOS, BILITOT, PROT, ALBUMIN in the last 168 hours. No results for input(s): LIPASE, AMYLASE in the last 168 hours. No results for input(s): AMMONIA in the last 168 hours. Coagulation profile No results for input(s): INR, PROTIME in the last 168 hours.  CBC: Recent Labs  Lab 07/05/20 0229 07/06/20 0338 07/07/20 0340 07/08/20 0053  07/09/20 0422 07/09/20 1331 07/10/20 0120 07/11/20 0431  WBC 8.2 7.8 6.2 5.6 8.4  --  7.0 6.8  NEUTROABS 6.1 5.6  --   --   --   --   --   --   HGB 9.3* 8.8* 9.2* 9.5* 8.5* 7.8* 7.5* 7.4*  HCT 31.3* 28.4* 31.0* 30.5* 27.2* 24.2* 22.7* 23.7*  MCV 96.0 93.4 94.8 93.3 91.6  --  89.0 92.9  PLT 230 190 188 195 188  --  195 200   Cardiac Enzymes: No results for input(s): CKTOTAL, CKMB, CKMBINDEX, TROPONINI in the last 168 hours. BNP: Invalid input(s): POCBNP CBG: Recent Labs  Lab 07/10/20 2152 07/10/20 2306 07/11/20 0316 07/11/20 0733 07/11/20 1156  GLUCAP 125* 111* 121* 108* 114*   D-Dimer No results for input(s): DDIMER in the last 72 hours. Hgb A1c No results for input(s): HGBA1C in the last 72 hours. Lipid Profile No results for input(s): CHOL, HDL, LDLCALC, TRIG, CHOLHDL, LDLDIRECT in the last 72 hours. Thyroid function studies No results for input(s): TSH, T4TOTAL, T3FREE, THYROIDAB in the last 72 hours.  Invalid input(s): FREET3 Anemia work up No results for input(s): VITAMINB12, FOLATE, FERRITIN, TIBC, IRON, RETICCTPCT  in the last 72 hours. Microbiology Recent Results (from the past 240 hour(s))  MRSA PCR Screening     Status: None   Collection Time: 07/04/20 12:54 PM   Specimen: Nasal Mucosa; Nasopharyngeal  Result Value Ref Range Status   MRSA by PCR NEGATIVE NEGATIVE Final    Comment:        The GeneXpert MRSA Assay (FDA approved for NASAL specimens only), is one component of a comprehensive MRSA colonization surveillance program. It is not intended to diagnose MRSA infection nor to guide or monitor treatment for MRSA infections. Performed at [facility], [address] [place], [place] [address]   Resp Panel by RT-PCR (Flu A&B, Covid) Nasopharyngeal Swab     Status: None   Collection Time: 07/11/20  9:35 AM   Specimen: Nasopharyngeal Swab; Nasopharyngeal(NP) swabs in vial transport medium  Result Value Ref Range Status   SARS Coronavirus 2 by  RT PCR NEGATIVE NEGATIVE Final    Comment: (NOTE) SARS-CoV-2 target nucleic acids are NOT DETECTED.  The SARS-CoV-2 RNA is generally detectable in upper respiratory specimens during the acute phase of infection. The lowest concentration of SARS-CoV-2 viral copies this assay can detect is 138 copies/mL. A negative result does not preclude SARS-Cov-2 infection and should not be used as the sole basis for treatment or other patient management decisions. A negative result may occur with  improper specimen collection/handling, submission of specimen other than nasopharyngeal swab, presence of viral mutation(s) within the areas targeted by this assay, and inadequate number of viral copies(<138 copies/mL). A negative result must be combined with clinical observations, patient history, and epidemiological information. The expected result is Negative.  Fact Sheet for Patients:  BloggerCourse.com  Fact Sheet for Healthcare Providers:  SeriousBroker.it  This test is no t yet approved or cleared by the [place] FDA and  has been authorized for detection and/or diagnosis of SARS-CoV-2 by FDA under an Emergency Use Authorization (EUA). This EUA will remain  in effect (meaning this test can be used) for the duration of the COVID-19 declaration under Section 564(b)(1) of the Act, 21 U.S.C.section 360bbb-3(b)(1), unless the authorization is terminated  or revoked sooner.       Influenza A by PCR NEGATIVE NEGATIVE Final   Influenza B by PCR NEGATIVE NEGATIVE Final    Comment: (NOTE) The Xpert Xpress SARS-CoV-2/FLU/RSV plus assay is intended as an aid in the diagnosis of influenza from Nasopharyngeal swab specimens and should not be used as a sole basis for treatment. Nasal washings and aspirates are unacceptable for Xpert Xpress SARS-CoV-2/FLU/RSV testing.  Fact Sheet for Patients: BloggerCourse.com  Fact Sheet  for Healthcare Providers: SeriousBroker.it  This test is not yet approved or cleared by the [place] FDA and has been authorized for detection and/or diagnosis of SARS-CoV-2 by FDA under an Emergency Use Authorization (EUA). This EUA will remain in effect (meaning this test can be used) for the duration of the COVID-19 declaration under Section 564(b)(1) of the Act, 21 U.S.C. section 360bbb-3(b)(1), unless the authorization is terminated or revoked.  Performed at [facility], [address] [place], [place] [address]      Discharge Instructions:   Discharge Instructions    Discharge diet:   Complete by: As directed    Tube feeding regimen:  - 1 can/ARC Osmolite 1.5 six times daily (can provide 2 at a time if able to tolerate) - 45 ml Prosource or other protein modular TID -100 ml free water flushes before  and after each feeding  Provides: 2250 kcals, 123 grams protein, 1086 ml free water (2286 ml with flushes).   Discharge instructions   Complete by: As directed    Follow-up with physician at the nursing home within 3 days of discharge   Discharge wound care:   Complete by: As directed    Apply Santyl to undermining edge of sacral wound.   Increase activity slowly   Complete by: As directed      Allergies as of 07/11/2020      Reactions   Iodine Swelling   Penicillins Swelling   ** tolerates cephalosporins Facial swelling, itchy throat      Medication List    TAKE these medications   acetaminophen 500 MG tablet Commonly known as: TYLENOL Take 1,000 mg by mouth 3 (three) times daily.   albuterol 108 (90 Base) MCG/ACT inhaler Commonly known as: VENTOLIN HFA Inhale 2 puffs into the lungs daily.   ascorbic acid 500 MG tablet Commonly known as: VITAMIN C Take 1,000 mg by mouth daily.   aspirin 325 MG tablet Take 325 mg by mouth every morning.   collagenase ointment Commonly known as: SANTYL Apply topically daily for 7  days. Apply to undermining edge of sacral wound. Start taking on: July 12, 2020   docusate sodium 100 MG capsule Commonly known as: COLACE Take 1 capsule (100 mg total) by mouth 2 (two) times daily.   escitalopram 5 MG tablet Commonly known as: LEXAPRO Take 2.5 mg by mouth at bedtime.   feeding supplement Liqd Take 237 mLs by mouth 4 (four) times daily. What changed: Another medication with the same name was added. Make sure you understand how and when to take each.   feeding supplement (OSMOLITE 1.5 CAL) Liqd Place 237 mLs into feeding tube 6 (six) times daily. What changed: You were already taking a medication with the same name, and this prescription was added. Make sure you understand how and when to take each.   ferrous sulfate 325 (65 FE) MG tablet Take 1 tablet (325 mg total) by mouth 2 (two) times daily with a meal.   gabapentin 100 MG capsule Commonly known as: NEURONTIN Take 200 mg by mouth 2 (two) times daily.   midodrine 10 MG tablet Commonly known as: PROAMATINE Take 1 tablet (10 mg total) by mouth 2 (two) times daily with a meal.   oxyCODONE 5 MG immediate release tablet Commonly known as: Oxy IR/ROXICODONE Take 0.5 tablets (2.5 mg total) by mouth daily as needed for moderate pain or severe pain. What changed:   when to take this  reasons to take this   pantoprazole 40 MG tablet Commonly known as: PROTONIX Take 1 tablet (40 mg total) by mouth at bedtime.   polyethylene glycol 17 g packet Commonly known as: MIRALAX / GLYCOLAX Take 17 g by mouth every morning. Mix in 6 oz water and drink   scopolamine 1 MG/3DAYS Commonly known as: TRANSDERM-SCOP Place 1 patch onto the skin every 3 (three) days.   tamsulosin 0.4 MG Caps capsule Commonly known as: FLOMAX Take 1 capsule (0.4 mg total) by mouth daily.            Discharge Care Instructions  (From admission, onward)         Start     Ordered   07/11/20 0000  Discharge wound care:        Comments: Apply Santyl to undermining edge of sacral wound.   07/11/20 1204  Contact information for after-discharge care    Destination    [facility]erred [facility] .   Service: Skilled Nursing Contact information: [address] [place] [address] [phone]                   Time coordinating discharge: 35 minutes  Signed:  Lurene Shadow  [facility] 07/11/2020, 2:08 PM   Pager on [email or web address]. If 7PM-7AM, please contact night-coverage at [email or web address]

## 2020-07-11 NOTE — Evaluation (Signed)
Physical Therapy Evaluation Patient Details Name: Friend Dorfman MRN: [id number] DOB: February 27, 1946 Today's Date: 07/11/2020   History of Present Illness  Mr. Omeed Osuna is a 75 y.o. 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  Then he had a fall at home developed epidural hematoma, underwent reexploration, removal of hardware, evacuation of hematoma on 10/30, now quadriplegic with dysphagia, unstageable sacral decubitus wounds, failure to thrive-with numerous recent hospitalizations for aspiration pneumonia/hematuria/UTI.  Clinical Impression  Prior to admission, pt receiving rehab from [facility] and dependent for mobility and ADL's. Pt pleasant, agreeable to therapy; A&Ox3. Requires total assist for all aspects of bed mobility. Performed PROM to all extremities and repositioned. Recommend return to SNF at discharge.     Follow Up Recommendations SNF    Equipment Recommendations  None recommended by PT    Recommendations for Other Services       Precautions / Restrictions Precautions Precautions: Fall;Cervical Required Braces or Orthoses: Cervical Brace Cervical Brace: Hard collar;At all times Restrictions Weight Bearing Restrictions: No      Mobility  Bed Mobility Overal bed mobility: Needs Assistance Bed Mobility: Rolling Rolling: Total assist;+2 for physical assistance         General bed mobility comments: TotalA +2 for rolling to right and left    Transfers                    Ambulation/Gait                Stairs            Wheelchair Mobility    Modified Rankin (Stroke Patients Only)       Balance                                             Pertinent  Vitals/Pain Pain Assessment: Faces Faces Pain Scale: Hurts little more Pain Location: generalized with movement Pain Descriptors / Indicators: Grimacing Pain Intervention(s): Monitored during session    Home Living Family/patient expects to be discharged to:: Skilled nursing facility                 Additional Comments: [facility]    Prior Function Level of Independence: Needs assistance   Gait / Transfers Assistance Needed: totalA  ADL's / Homemaking Assistance Needed: totalA        Hand Dominance   Dominant Hand: Right    Extremity/Trunk Assessment   Upper Extremity Assessment Upper Extremity Assessment: RUE deficits/detail;LUE deficits/detail RUE Deficits / Details: Shoulder/elbow ROM WFL LUE Deficits / Details: Shoulder/elbow ROM WFL    Lower Extremity Assessment Lower Extremity Assessment: LLE deficits/detail;RLE deficits/detail RLE Deficits / Details: Ankle dorsiflexion limited ~10 degrees from neutral LLE Deficits / Details: Ankle dorsiflexion limited ~15 degrees from neutral       Communication   Communication: No difficulties  Cognition Arousal/Alertness: Awake/alert Behavior During Therapy: Flat affect Overall Cognitive Status: No family/caregiver present to determine baseline cognitive functioning Area of Impairment: Safety/judgement;Awareness                         Safety/Judgement: Decreased awareness of deficits Awareness: Emergent   General Comments: Pt  A&Ox3, although incorrectly stating the day of the week.      General Comments      Exercises General Exercises - Upper Extremity Digit Composite Flexion: PROM;Both;10 reps;Supine Composite Extension: PROM;Both;10 reps;Seated General Exercises - Lower Extremity Ankle Circles/Pumps: PROM;Both;10 reps;Supine Heel Slides: PROM;Both;10 reps;Supine Other Exercises Other Exercises: Supine: BUE PROM D1/D2 x 10, PROM bilateral hip external and internal rotation x 10 each    Assessment/Plan    PT Assessment Patent does not need any further PT services  PT Problem List Decreased strength;Decreased range of motion;Decreased activity tolerance;Decreased balance;Decreased mobility;Decreased coordination;Cardiopulmonary status limiting activity;Decreased skin integrity       PT Treatment Interventions      PT Goals (Current goals can be found in the Care Plan section)  Acute Rehab PT Goals Patient Stated Goal: go to rehab PT Goal Formulation: With patient Time For Goal Achievement: 07/25/20 Potential to Achieve Goals: Fair    Frequency     Barriers to discharge        Co-evaluation               AM-PAC PT "6 Clicks" Mobility  Outcome Measure Help needed turning from your back to your side while in a flat bed without using bedrails?: Total Help needed moving from lying on your back to sitting on the side of a flat bed without using bedrails?: Total Help needed moving to and from a bed to a chair (including a wheelchair)?: Total Help needed standing up from a chair using your arms (e.g., wheelchair or bedside chair)?: Total Help needed to walk in hospital room?: Total Help needed climbing 3-5 steps with a railing? : Total 6 Click Score: 6    End of Session Equipment Utilized During Treatment: Cervical collar;Oxygen Activity Tolerance: Patient tolerated treatment well Patient left: in bed;with call bell/phone within reach;with bed alarm set   PT Visit Diagnosis: Muscle weakness (generalized) (M62.81);Difficulty in walking, not elsewhere classified (R26.2);Other symptoms and signs involving the nervous system (R29.898);Other (comment)    Time: 1224-8250 PT Time Calculation (min) (ACUTE ONLY): 15 min   Charges:   PT Evaluation $PT Eval Moderate Complexity: 1 Mod         Lillia Pauls, PT, DPT Acute Rehabilitation Services Pager [phone] Office [phone]   Norval Morton 07/11/2020, 3:05 PM

## 2020-07-11 NOTE — Progress Notes (Signed)
Attempted to call report to [facility] with no answer at this time.

## 2020-07-11 NOTE — Progress Notes (Signed)
Physical Therapy Wound Treatment Patient Details  Name: Shannon [name] MRN: [id number] Date of Birth: 03-12-1946  Today's Date: 07/11/2020 Time: 1120-1200 Time Calculation (min): 40 min  Subjective  Subjective: "I am leaving today." Patient and Family Stated Goals: did not state Date of Onset:  (unknown) Prior Treatments: Dressing change, hydrotherapy prior admission 05/2020, s/p bedside debridement 07/04/2019  Pain Score:  4/10  Wound Assessment  Pressure Injury 06/22/20 Sacrum Posterior Stage 4 - Full thickness tissue loss with exposed bone, tendon or muscle. PHYSICAL THERAPY/HYDROTHERAPY (Active)  Wound Image   07/11/20 1307  Dressing Type ABD;Barrier Film (skin prep);Gauze (Comment);Moist to dry 07/11/20 1307  Dressing Changed;Intact;Dry 07/11/20 1307  Dressing Change Frequency Twice a day 07/11/20 1307  State of Healing Early/partial granulation 07/11/20 1307  Site / Wound Assessment Yellow;Pink;Brown;Red 07/11/20 1307  % Wound base Red or Granulating 30% 07/11/20 1307  % Wound base Yellow/Fibrinous Exudate 35% 07/11/20 1307  % Wound base Black/Eschar 20% 07/11/20 1307  % Wound base Other/Granulation Tissue (Comment) 15% 07/11/20 1307  Peri-wound Assessment Erythema (non-blanchable);Pink 07/11/20 1307  Wound Length (cm) 13 cm 07/11/20 1307  Wound Width (cm) 9 cm 07/11/20 1307  Wound Depth (cm) 4 cm 07/11/20 1307  Wound Surface Area (cm^2) 117 cm^2 07/11/20 1307  Wound Volume (cm^3) 468 cm^3 07/11/20 1307  Undermining (cm) 2-4 cm from 9-12 o clock 07/11/20 1307  Margins Unattached edges (unapproximated) 07/11/20 1307  Drainage Amount Moderate 07/11/20 1307  Drainage Description Serosanguineous 07/11/20 1307  Treatment Debridement (Selective);Hydrotherapy (Pulse lavage);Packing (Saline gauze) 07/11/20 1307      Hydrotherapy Pulsed lavage therapy - wound location: sacrum Pulsed Lavage with Suction (psi): 4 psi (4-8) Pulsed Lavage with Suction - Normal Saline Used: 1000  mL Pulsed Lavage Tip: Tip with splash shield Selective Debridement Selective Debridement - Location: Sacrum Selective Debridement - Tools Used: Forceps;Scissors Selective Debridement - Tissue Removed: slough   Wound Assessment and Plan  Wound Therapy - Assess/Plan/Recommendations Wound Therapy - Clinical Statement: Wound bed continuing to improve in appearance with increased granulation tissue. Slower progress with debridement Pt will benefit from hydrotherapy for selective debridement and to decrease bioburden. Wound Therapy - Functional Problem List: Decreased mobility and global weakness Factors Delaying/Impairing Wound Healing: Immobility Hydrotherapy Plan: Debridement;Dressing change;Patient/family education;Pulsatile lavage with suction Wound Therapy - Frequency: 6X / week Wound Therapy - Follow Up Recommendations: Skilled nursing facility Wound Plan: See above  Wound Therapy Goals- Improve the function of patient's integumentary system by progressing the wound(s) through the phases of wound healing (inflammation - proliferation - remodeling) by: Decrease Necrotic Tissue to: 50 Decrease Necrotic Tissue - Progress: Progressing toward goal Increase Granulation Tissue to: 50 Increase Granulation Tissue - Progress: Progressing toward goal Goals/treatment plan/discharge plan were made with and agreed upon by patient/family: Yes Time For Goal Achievement: 7 days Wound Therapy - Potential for Goals: Fair  Goals will be updated until maximal potential achieved or discharge criteria met.  Discharge criteria: when goals achieved, discharge from hospital, MD decision/surgical intervention, no progress towards goals, refusal/missing three consecutive treatments without notification or medical reason.  GP  Wyona Almas, PT, DPT Acute Rehabilitation Services Pager [phone] Office [phone]      Deno Etienne 07/11/2020, 1:11 PM

## 2020-07-28 ENCOUNTER — Other Ambulatory Visit (HOSPITAL_COMMUNITY): Payer: Self-pay | Admitting: Student

## 2020-07-28 ENCOUNTER — Other Ambulatory Visit: Payer: Self-pay | Admitting: Student

## 2020-07-28 DIAGNOSIS — Z981 Arthrodesis status: Secondary | ICD-10-CM

## 2020-08-09 ENCOUNTER — Ambulatory Visit (HOSPITAL_COMMUNITY)
Admission: RE | Admit: 2020-08-09 | Discharge: 2020-08-09 | Disposition: A | Discharge status: Home / Self Care | Payer: Medicare Other | Source: Ambulatory Visit | Attending: Student | Admitting: Student

## 2020-08-09 ENCOUNTER — Other Ambulatory Visit: Payer: Self-pay

## 2020-08-09 DIAGNOSIS — Z981 Arthrodesis status: Secondary | ICD-10-CM | POA: Insufficient documentation

## 2020-08-09 IMAGING — CT CT CERVICAL SPINE W/O CM
3 of 4 series · 12 of 33 positions shown, 14 images · non-contrast
Comparison: [DATE]

CLINICAL DATA: Fell

EXAM:
CT CERVICAL SPINE WITHOUT CONTRAST
TECHNIQUE: Multidetector CT imaging of the cervical spine was performed without
intravenous contrast. Multiplanar CT image reconstructions were also
generated.

[Series 7: orthogonal bone · axial · 0.23mm/px · z∈[-332,-187]mm · 4 of 112 slices shown, 5 images]
[im 16/112  soft-tissue]
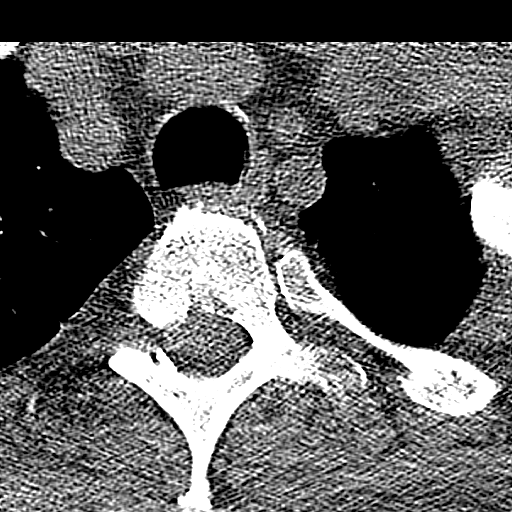
[im 16/112  bone]
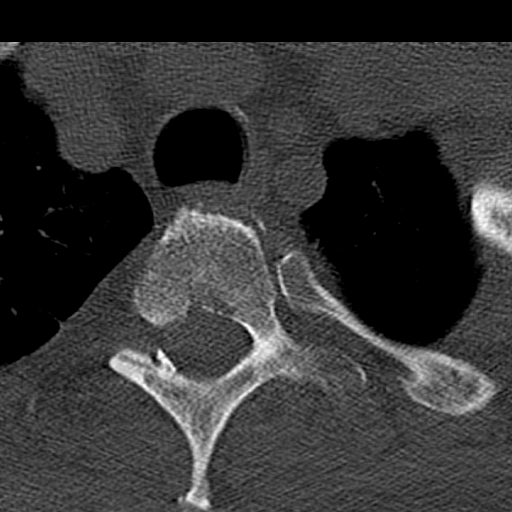
[im 48/112  bone]
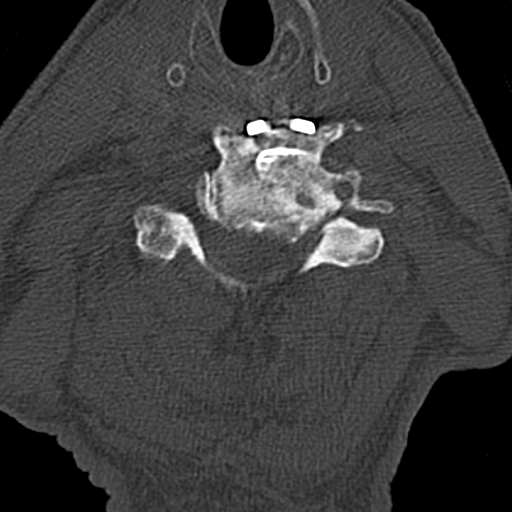
[im 64/112  bone]
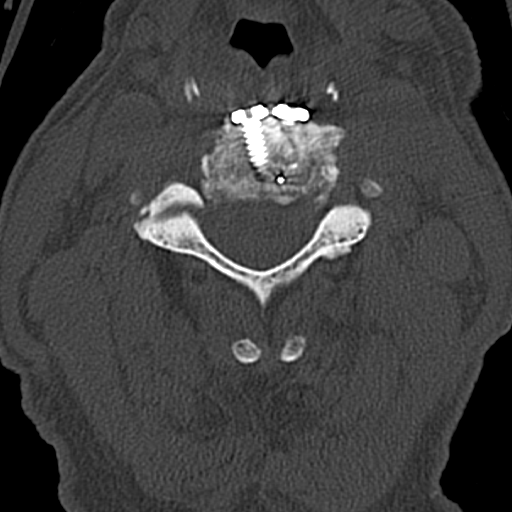
[im 96/112  bone]
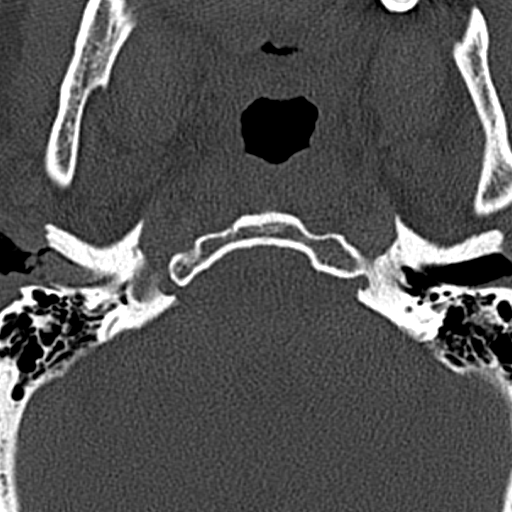

[Series 8: coronal bone · coronal · 0.28mm/px · 3 of 61 slices shown]
[im 16/61  bone]
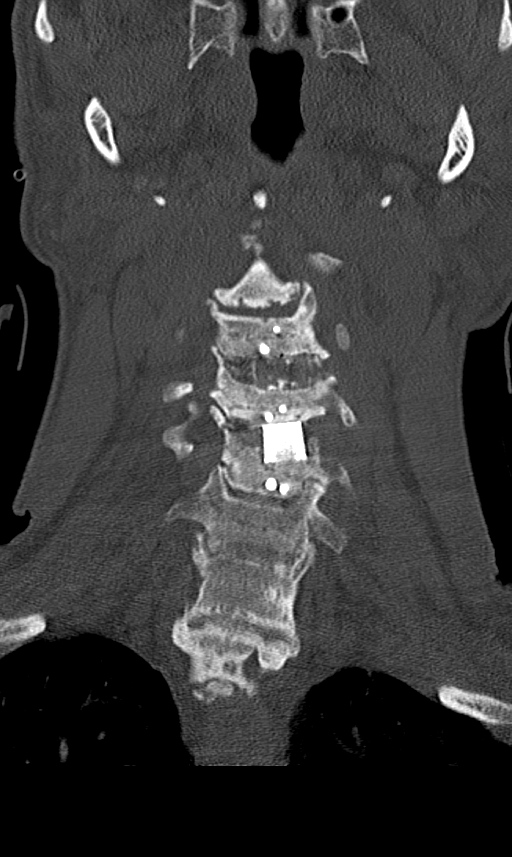
[im 26/61  bone]
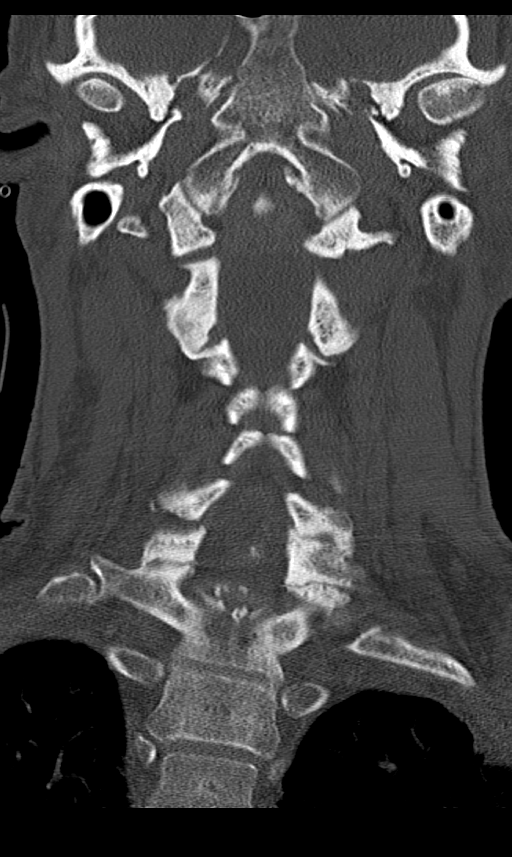
[im 35/61  bone]
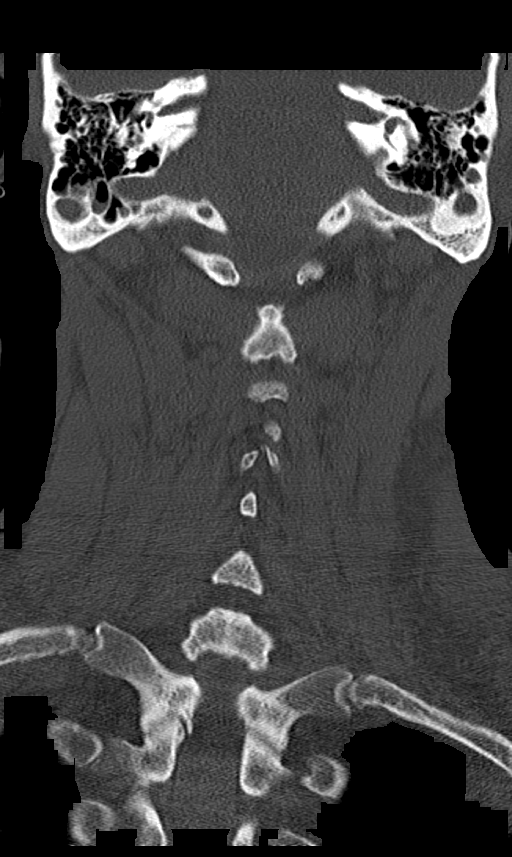

[Series 9: sagittal bone · sagittal · 0.28mm/px · 5 of 61 slices shown, 6 images]
[im 21/61  bone]
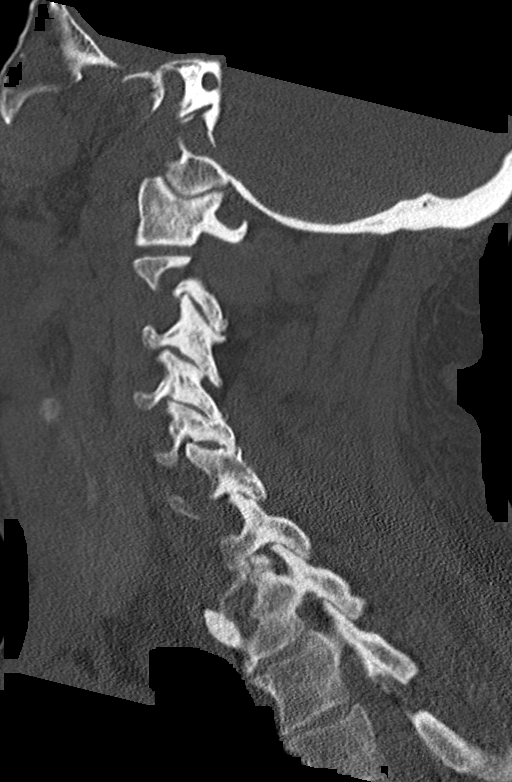
[im 26/61  bone]
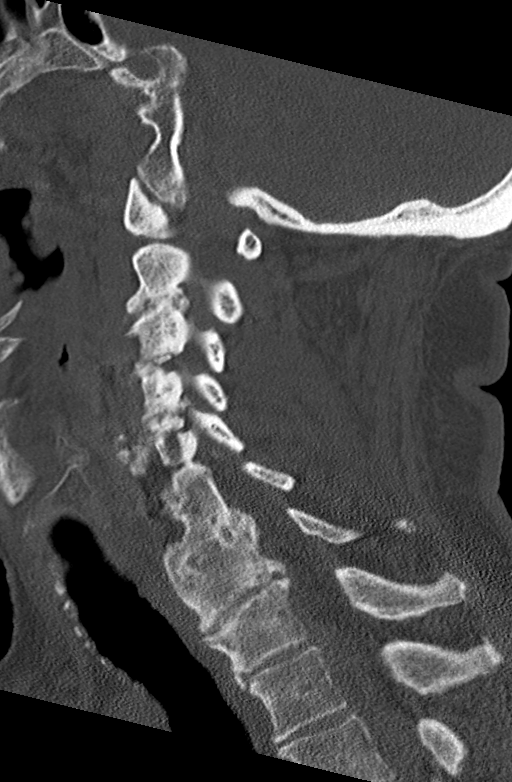
[im 31/61  soft-tissue]
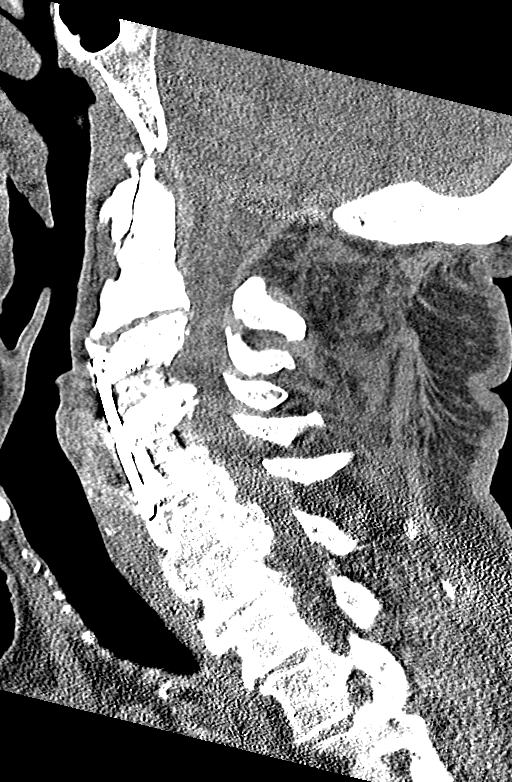
[im 31/61  bone]
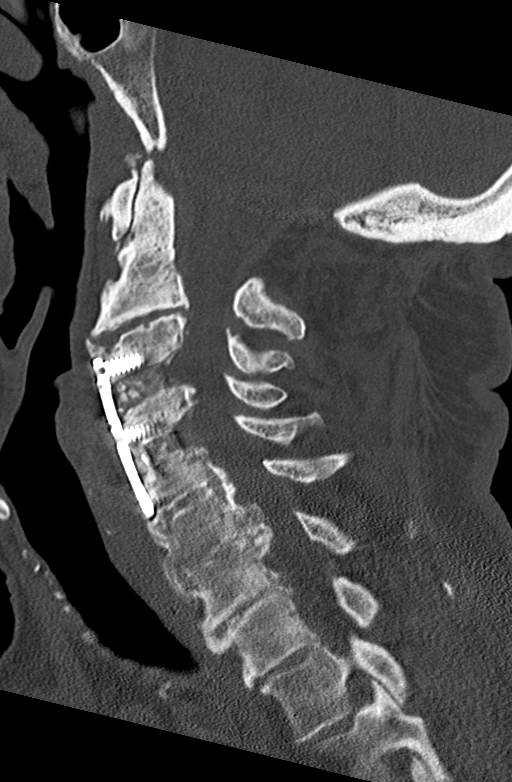
[im 36/61  bone]
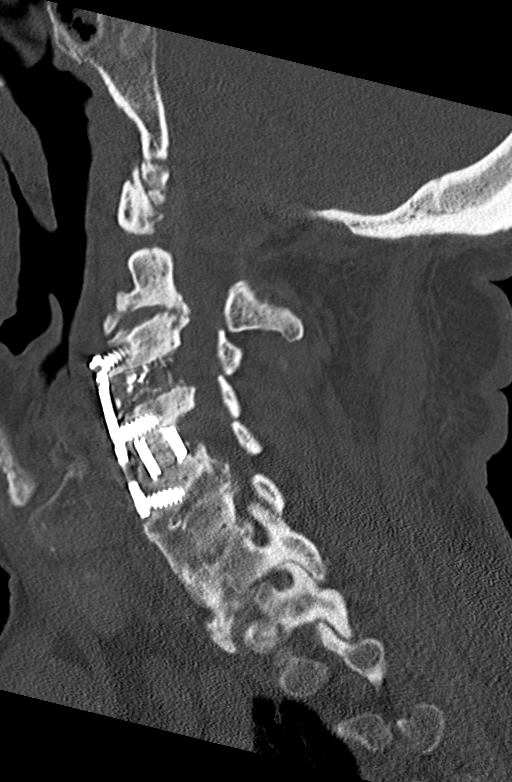
[im 41/61  bone]
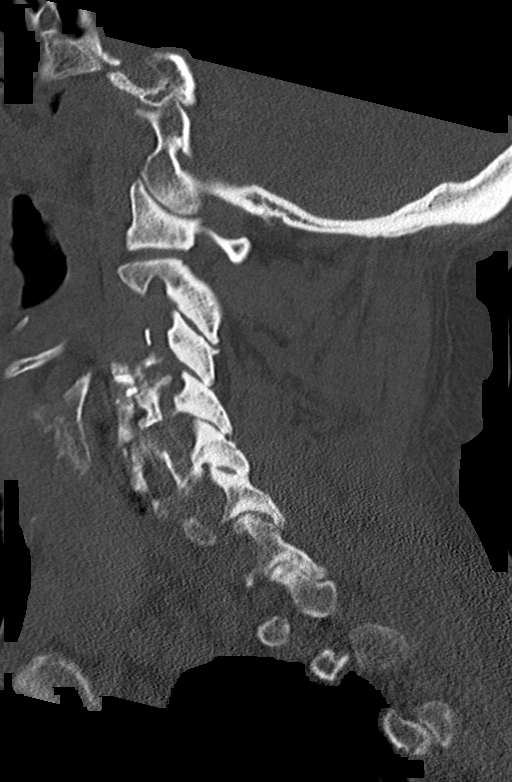

[12 of 33 positions shown; findings below may reference images not displayed]

FINDINGS: Alignment: Alignment is anatomic.

Skull base and vertebrae: No acute fracture. No primary bone lesion
or focal pathologic process.

Soft tissues and spinal canal: No prevertebral fluid or swelling. No
visible canal hematoma.

Disc levels: Postsurgical changes from discectomy and anterior
fusion spanning C3 through C5. Extensive spondylosis at the
nonsurgical levels. There is mild central canal stenosis at C5-6 and
C6-7 due to central disc osteophyte complex. Bilateral neural
foraminal encroachment at C2-3, C3-4, and C4-5, with left
predominant encroachment C5-6, C6-7, and C7-T1.

Upper chest: Airway is patent.  Lung apices are clear.

Other: Reconstructed images demonstrate no additional findings.
IMPRESSION: 1. No acute cervical spine fracture.
2. C3-C5 ACDF.
3. Diffuse cervical spondylosis and facet hypertrophy as above.
Prominent central canal stenosis at C5-6 and C6-7, with diffuse
neural foraminal narrowing throughout the cervical spine.

## 2020-08-15 ENCOUNTER — Non-Acute Institutional Stay: Payer: No Typology Code available for payment source | Admitting: Student

## 2020-08-15 DIAGNOSIS — Z515 Encounter for palliative care: Secondary | ICD-10-CM

## 2020-08-15 NOTE — Progress Notes (Signed)
[place] Consult Note Telephone: [phone]  Fax: [phone]  PATIENT NAME: Shannon [name] [address] [place] [address] [phone] (home)  DOB: [birth year] MRN: [id number]  PRIMARY CARE PROVIDER:    Tammi Sou, MD,  [address] [place] [address] [phone]  REFERRING PROVIDER:   Tammi Sou, MD [address] [place],  [place] [address] [phone]  RESPONSIBLE PARTY:   Extended Emergency Contact Information Primary Emergency Contact: Shannon [name] Phone: [phone] Mobile Phone: [phone] Relation: Niece Preferred language: English Interpreter needed? No Secondary Emergency Contact: Shannon [name] Home Phone: [phone] Work Phone: [phone] Mobile Phone: [phone] Relation: Daughter  I met face to face with patient in facility.   ASSESSMENT AND RECOMMENDATIONS:   Advance Care Planning: Visit at the request of Shannon [name] for palliative consult. Visit consisted of building trust and discussions on Palliative care medicine as specialized medical care for people living with serious illness, aimed at facilitating improved quality of life through symptoms relief, assisting with advance care planning and establishing goals of care. Palliative care will continue to provide support to patient, family and the medical team.  Goal of care: To be comfortable, continue therapy in hopes to improve physically.  Directives: MOST form reviewed; Attempt CPR, full scope of treatment, antibiotics, IV fluids as indicated, feeding tube for a defined trial.   Symptom Management:   Pain-patient reports back and neck pain S/p spinal injury. Started on Lyrica 08/14/20; continue Lyrica 25m BID via g-tube, oxycodone 2.567mQD prn severe pain via g-tube, acetaminophen 65044mID via g-tube.   Wound-patient with stage 4 sacral wound; continue daily dressing per facility  directions, wound care nurse. Air mattress on bed.  Cognitive / Functional decline: Patient with quadriparesis; requires total dependence for all adl's. Currently receiving OT/PT; continue therapy as directed.   Follow up Palliative Care Visit: Palliative care will continue to follow for complex decision making and symptom management. Return in 4 weeks or prn.  Family /Caregiver/Community Supports: Palliative Medicine will continue to provide support.     I spent 35 minutes providing this consultation, from 1:45pm to 2:20pm. Time includes time spent with patient/family, chart review, provider coordination, and documentation. More than 50% of the time in this consultation was spent counseling and coordinating communication.   CHIEF COMPLAINT: Palliative Medicine initial consult.   History obtained from review of EMR, discussion with primary team and patient. Records reviewed and summarized below.    HISTORY OF PRESENT ILLNESS:  Shannon [name] a 74 97o. year old male with multiple medical problems including cervical spondylosis with myelopathy, s/p cervical spinal fusion, quadriparesis, dysphagia, oropharyngeal phase, muscle weakness, cerebrovascular disease, muscle wasting and atrophy, epidural hemorrhage, hx of respiratory failure with hypoxia. Palliative Care was asked to follow this patient by consultation request of Dr. BlaKeenan [name] help address advance care planning and goals of care. This is an initial visit.  [name] is [facility]. Therapy states patient is sitting up to bed for short periods, working on mobility of upper extremities, trunk support. He is dependent for all adl's. He wears cervical collar at all times; he is hoping to have this discontinued soon. Shannon [name] neck and back pain. He is receiving all his nutrition via g-tube. He wears oxygen via nasal canula prn to maintain sats 92% or greater. He  endorses occasional shortness of breath. Has  stage 4 sacral wound; care provided by wound care nurse.   CODE STATUS: Full Code  PPS: 30%  HOSPICE ELIGIBILITY/DIAGNOSIS: TBD  ROS   General: NAD, reports back and neck pain EYES: denies vision changes ENMT: dysphagia Cardiovascular: denies chest pain Pulmonary: denies cough, increased SOB Abdomen: receives all nutrition via g-tube, endorses incontinence of bowel GU: denies dysuria, foley catheter MSK: decreased ROM to all extremities, no falls reported Skin: denies rashes, blisters to arms Neurological: endorses weakness Psych: Endorses positive mood Heme/lymph/immuno: denies bruises, abnormal bleeding   Physical Exam: Weight today 179 pounds Constitutional: NAD, alert and oriented x 3 General: frail appearing, chronically ill appearing EYES: anicteric sclera, lids intact, no discharge  ENMT: intact hearing CV: RRR, no LE edema, edema to bilateral arms Pulmonary: LCTA, no increased work of breathing, no cough, no audible wheezes, oxygen at 2lpm via n/c Abdomen: g-tube, bowel sounds normoactive x 4 GU: foley catheter intact; amber urine MSK: decreased ROM in all extremities, non ambulatory Skin: warm and dry, blister to left upper arm Neuro: Generalized weakness Psych: non anxious, pleasant affect Hem/lymph/immuno: no widespread bruising   PAST MEDICAL HISTORY:  Past Medical History:  Diagnosis Date  . Asthma   . 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  Quadraparesis, L>R, w/ cape-like sensory deficit to pin prick (Shannon [name], 1992-->surg at [facility].  Summer 2021->Cervicalgia,arm pain, hand atrophy RUE wkness, hyperreflex (Shannon [name] MRI: extensive cord atrophy and spinal and foraminal stenosis->to get surgery 03/2020  . Hay fever   . Hypertension   . Osteoarthritis of both hands     SOCIAL HX:  Social History   Tobacco Use  . Smoking status: Former Research scientist (life sciences)  . Smokeless tobacco: Never Used   Substance Use Topics  . Alcohol use: Not Currently   FAMILY HX:  Family History  Problem Relation Age of Onset  . Heart attack Mother   . Heart disease Mother   . High blood pressure Mother   . Alcohol abuse Father   . Cancer Father   . Diabetes Father   . High blood pressure Father     ALLERGIES:  Allergies  Allergen Reactions  . Iodine Swelling  . Penicillins Swelling    ** tolerates cephalosporins Facial swelling, itchy throat     PERTINENT MEDICATIONS:  Outpatient Encounter Medications as of 08/15/2020  Medication Sig  . acetaminophen (TYLENOL) 500 MG tablet Take 1,000 mg by mouth 3 (three) times daily.   Marland Kitchen albuterol (VENTOLIN HFA) 108 (90 Base) MCG/ACT inhaler Inhale 2 puffs into the lungs daily.  Marland Kitchen ascorbic acid (VITAMIN C) 500 MG tablet Take 1,000 mg by mouth daily.  Marland Kitchen aspirin 325 MG tablet Take 325 mg by mouth every morning.  . docusate sodium (COLACE) 100 MG capsule Take 1 capsule (100 mg total) by mouth 2 (two) times daily.  Marland Kitchen escitalopram (LEXAPRO) 5 MG tablet Take 2.5 mg by mouth at bedtime.  . feeding supplement (ENSURE ENLIVE / ENSURE PLUS) LIQD Take 237 mLs by mouth 4 (four) times daily.  . ferrous sulfate 325 (65 FE) MG tablet Take 1 tablet (325 mg total) by mouth 2 (two) times daily with a meal.  . gabapentin (NEURONTIN) 100 MG capsule Take 200 mg by mouth 2 (two) times daily.  . midodrine (PROAMATINE) 10 MG tablet Take 1 tablet (10 mg total) by mouth 2 (two) times daily with a meal.  . Nutritional Supplements (FEEDING SUPPLEMENT, OSMOLITE 1.5 CAL,) LIQD Place 237 mLs into feeding tube 6 (  six) times daily.  Marland Kitchen oxyCODONE (OXY IR/ROXICODONE) 5 MG immediate release tablet Take 0.5 tablets (2.5 mg total) by mouth daily as needed for moderate pain or severe pain.  . pantoprazole (PROTONIX) 40 MG tablet Take 1 tablet (40 mg total) by mouth at bedtime.  . polyethylene glycol (MIRALAX / GLYCOLAX) 17 g packet Take 17 g by mouth every morning. Mix in 6 oz water and drink   . scopolamine (TRANSDERM-SCOP) 1 MG/3DAYS Place 1 patch onto the skin every 3 (three) days.  . tamsulosin (FLOMAX) 0.4 MG CAPS capsule Take 1 capsule (0.4 mg total) by mouth daily.   No facility-administered encounter medications on file as of 08/15/2020.     Thank you for the opportunity to participate in the care of Mr. Forero. The palliative care team will continue to follow. Please call our office at [phone] if we can be of additional assistance.  Ezekiel Slocumb, NP, AGPCNP-C

## 2020-08-16 ENCOUNTER — Other Ambulatory Visit: Payer: Self-pay

## 2020-08-30 ENCOUNTER — Other Ambulatory Visit: Payer: Self-pay | Admitting: Nurse Practitioner

## 2020-08-30 DIAGNOSIS — M869 Osteomyelitis, unspecified: Secondary | ICD-10-CM

## 2020-08-31 ENCOUNTER — Other Ambulatory Visit (HOSPITAL_COMMUNITY): Payer: Self-pay | Admitting: Nurse Practitioner

## 2020-08-31 DIAGNOSIS — M869 Osteomyelitis, unspecified: Secondary | ICD-10-CM

## 2020-09-12 ENCOUNTER — Ambulatory Visit (HOSPITAL_COMMUNITY)
Admission: RE | Admit: 2020-09-12 | Discharge: 2020-09-12 | Disposition: A | Discharge status: Home / Self Care | Payer: Medicare Other | Source: Ambulatory Visit | Attending: Nurse Practitioner | Admitting: Nurse Practitioner

## 2020-09-12 ENCOUNTER — Other Ambulatory Visit: Payer: Self-pay

## 2020-09-12 DIAGNOSIS — M869 Osteomyelitis, unspecified: Secondary | ICD-10-CM | POA: Diagnosis present

## 2020-09-12 IMAGING — MR MR PELVIS W/O CM
7 series · 48 of 48 positions shown · non-contrast
Comparison: None.

CLINICAL DATA: Nonhealing sacral decubitus ulcer

EXAM:
MRI PELVIS WITHOUT CONTRAST
TECHNIQUE: Multiplanar multisequence MR imaging of the pelvis was performed. No
intravenous contrast was administered.

[Series 2: T1 · coronal · 5.0mm · 1.19mm/px · 4 of 33 slices shown (1 of 2)]
[im 1/33]
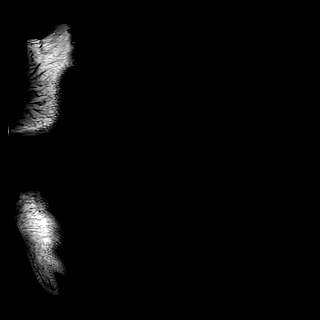
[im 11/33]
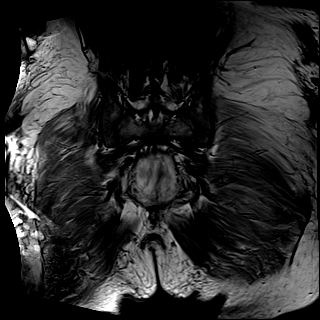
[im 22/33]
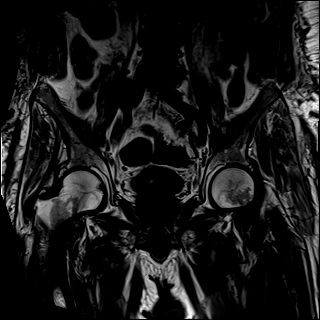
[im 33/33]
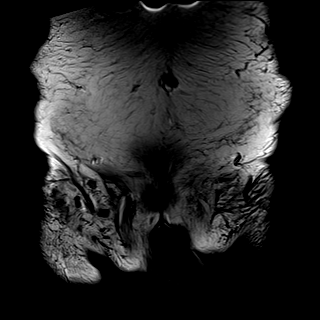

[Series 3: STIR · coronal · 5.0mm · 1.19mm/px · 5 of 33 slices shown (1 of 2)]
[im 1/33]
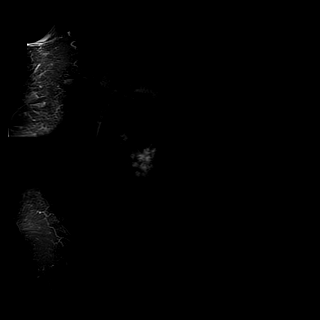
[im 9/33]
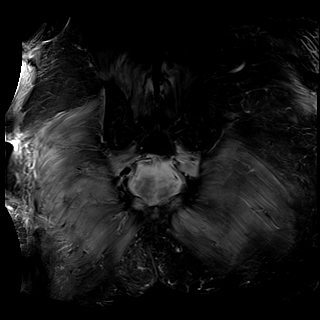
[im 17/33]
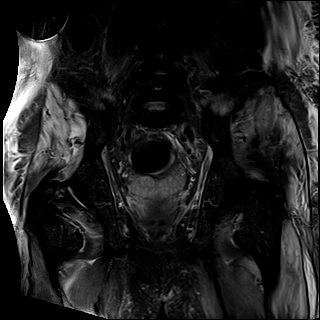
[im 25/33]
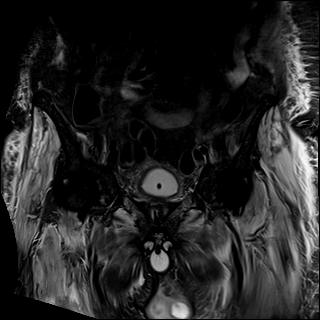
[im 33/33]
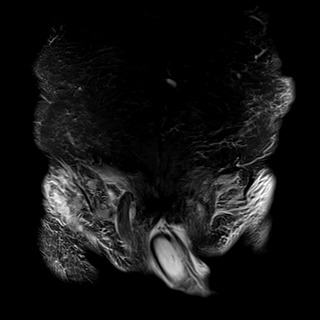

[Series 4: T1 · axial · 4.0mm · 0.94mm/px · z∈[-111,+139]mm · 8 of 51 slices shown (2 of 2)]
[im 1/51]
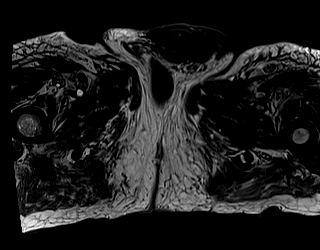
[im 8/51]
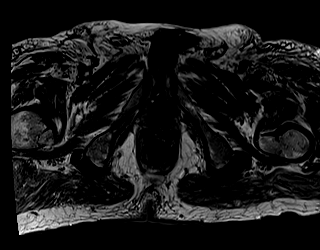
[im 15/51]
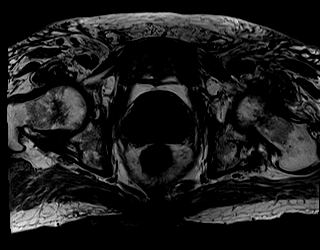
[im 22/51]
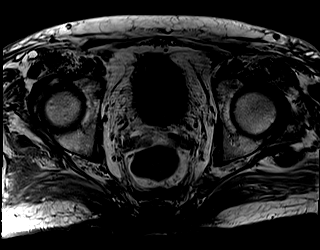
[im 29/51]
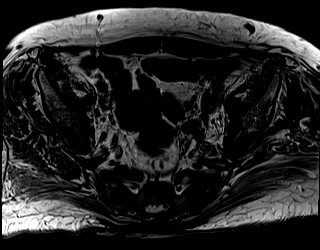
[im 36/51]
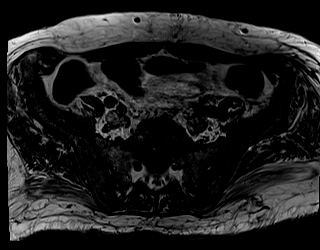
[im 43/51]
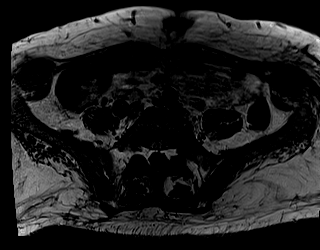
[im 51/51]
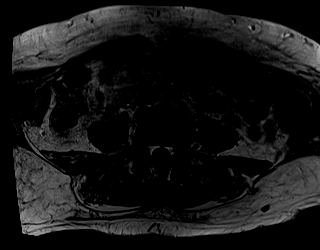

[Series 5: T2 fat-sat · axial · 4.0mm · 0.94mm/px · z∈[-111,+139]mm · 8 of 51 slices shown]
[im 1/51]
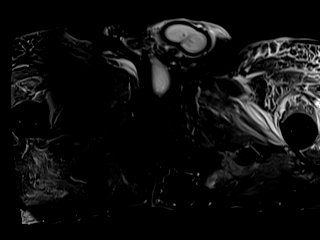
[im 8/51]
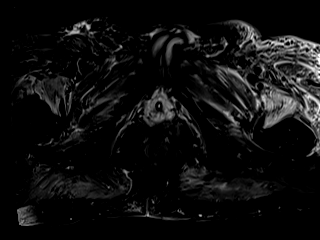
[im 15/51]
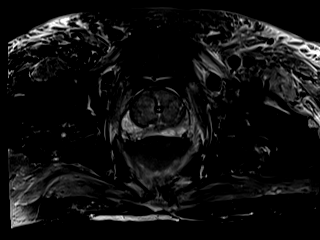
[im 22/51]
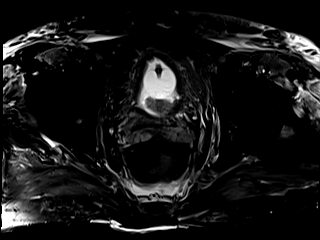
[im 29/51]
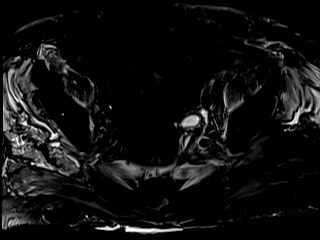
[im 36/51]
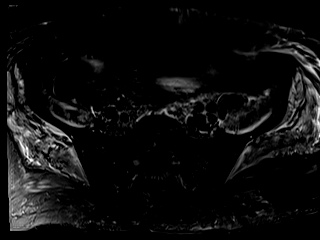
[im 43/51]
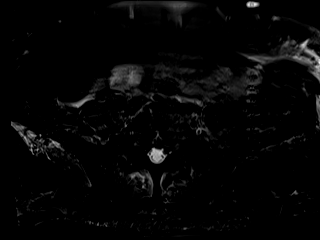
[im 51/51]
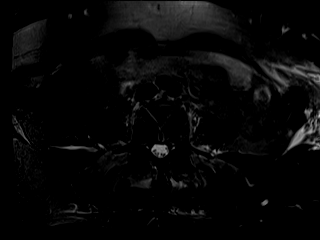

[Series 6: STIR · sagittal · 5.0mm · 1.19mm/px · 7 of 49 slices shown (2 of 2)]
[im 1/49]
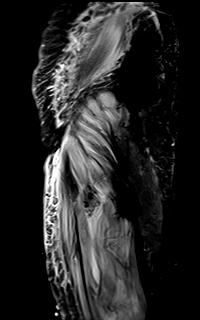
[im 9/49]
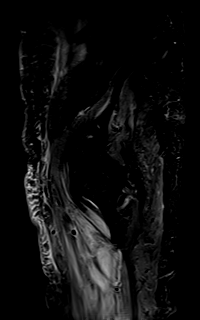
[im 17/49]
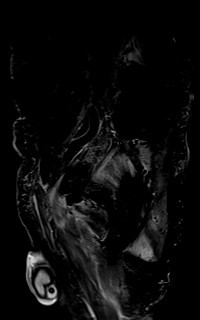
[im 25/49]
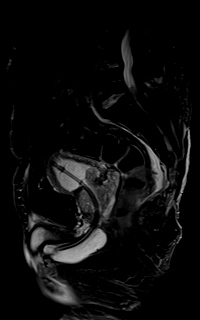
[im 33/49]
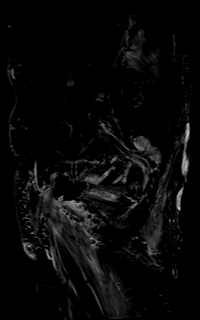
[im 41/49]
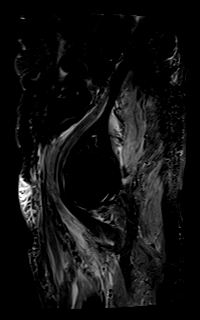
[im 49/49]
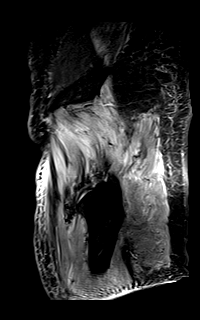

[Series 7: DWI · axial · 6.0mm · 1.36mm/px · z∈[-114,+138]mm · 11 of 72 slices shown (1 of 2)]
[im 1/72]
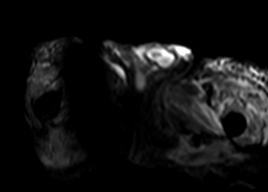
[im 8/72]
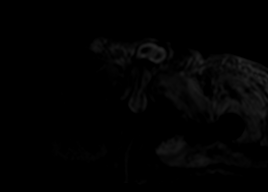
[im 15/72]
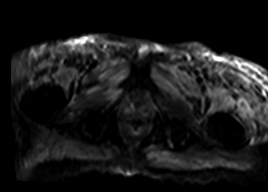
[im 22/72]
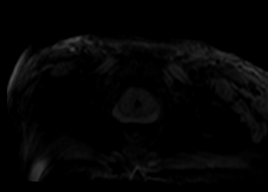
[im 29/72]
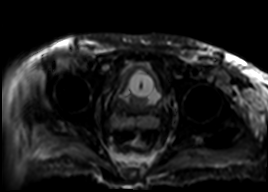
[im 36/72]
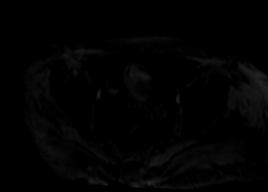
[im 43/72]
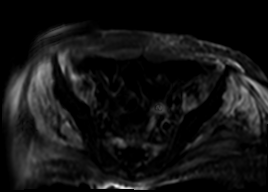
[im 50/72]
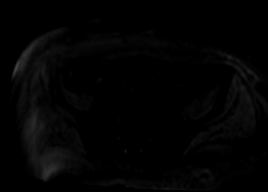
[im 57/72]
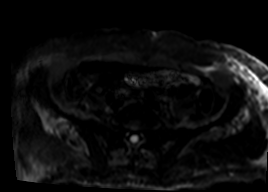
[im 64/72]
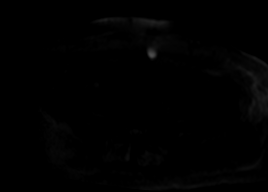
[im 72/72]
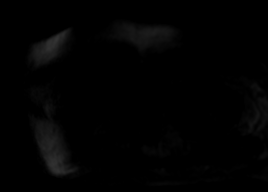

[Series 8: DWI · axial · 6.0mm · 1.36mm/px · z∈[-114,+138]mm · 5 of 36 slices shown (2 of 2)]
[im 1/36]
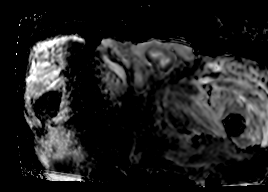
[im 9/36]
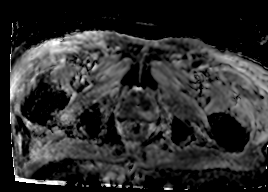
[im 18/36]
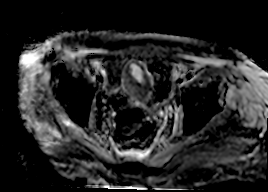
[im 27/36]
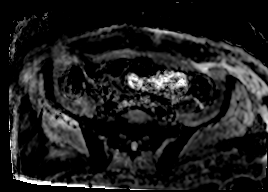
[im 36/36]
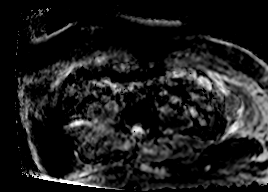

[48 of 48 positions shown; findings below may reference images not displayed]

FINDINGS: Bones:

No hip fracture, dislocation or avascular necrosis.

Sacral decubitus ulcer overlying the posterior lower sacrum with
bone marrow edema and cortical irregularity most consistent with
sacral osteomyelitis. Adjacent soft tissue edema in the medial
aspect of the left gluteus maximus muscle likely reflects a small
phlegmon measuring 4.3 x 1.8 cm.

Normal sacrum and sacroiliac joints. No SI joint widening or erosive
changes.

Articular cartilage and labrum

Articular cartilage: Mild partial-thickness cartilage loss of the
hips bilaterally.

Labrum: Grossly intact, but evaluation is limited by lack of
intraarticular fluid.

Joint or bursal effusion

Joint effusion:  No hip joint effusion.  No SI joint effusion.

Bursae:  No bursa formation.

Muscles and tendons

Generalized T2 hyperintense signal throughout the pelvic musculature
likely neurogenic given the patient's history of quadriplegia.

Other findings

No pelvic free fluid. No fluid collection or hematoma. No inguinal
lymphadenopathy. No inguinal hernia.
IMPRESSION: 1. Sacral decubitus ulcer overlying the posterior lower sacrum with
bone marrow edema and cortical irregularity most consistent with
sacral osteomyelitis. Adjacent soft tissue edema in the medial
aspect of the left gluteus maximus muscle likely reflects a small
phlegmon measuring 4.3 x 1.8 cm.

## 2020-09-19 ENCOUNTER — Other Ambulatory Visit: Payer: Self-pay

## 2020-09-19 ENCOUNTER — Encounter: Payer: Self-pay | Admitting: Internal Medicine

## 2020-09-19 ENCOUNTER — Ambulatory Visit (INDEPENDENT_AMBULATORY_CARE_PROVIDER_SITE_OTHER): Payer: Medicare Other | Admitting: Internal Medicine

## 2020-09-19 DIAGNOSIS — M4628 Osteomyelitis of vertebra, sacral and sacrococcygeal region: Secondary | ICD-10-CM | POA: Insufficient documentation

## 2020-09-19 NOTE — Progress Notes (Signed)
[facility]  Reason for Consult: Sacral osteomyelitis Referring Provider: Dr. Blenda Mounts  Assessment: He has a very large sacral wound complicated by sacral osteomyelitis.  I told them that the only way to have her cure this type of infection and heal this type of wound requires reversing the entire cascade of events that led up to the wound in the first place.  It would require excellent, sustained wound care, offloading pressure, improve nutritional status, consideration of a diverting colostomy and then antibiotic therapy.  This would likely require a PICC and 4 to 6 weeks of IV vancomycin and ertapenem.  When discussing all of this he became tearful and anxious and requested to be taken back to his facility as soon as possible.  He is having quite a bit of pain.  His daughter requested more time to consider their options.  I will arrange a phone visit with him in 48 hours.   Plan: 1. Phone follow-up visit in 48 hours to continue discussion of his management options  Patient Active Problem List   Diagnosis Date Noted  . 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    Priority: High  . Sacral decubitus ulcer 06/19/2020    Priority: High  . Quadriplegia and quadriparesis (HCC)     Priority: Medium  . 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    Priority: Medium  . Hematuria 06/19/2020  . COVID-19 virus infection 06/06/2020 06/19/2020  . Palliative care by specialist   . Goals of care, counseling/discussion   . Protein-calorie malnutrition, severe 05/05/2020  . Chronic indwelling Foley catheter 05/03/2020  . Acute blood loss anemia   . Slow transit constipation   . Essential hypertension   . Benign essential HTN   . Neuropathic pain   . Asthma     Patient's Medications  New Prescriptions   No medications on file  Previous Medications   ACETAMINOPHEN (TYLENOL) 500 MG TABLET    Take 1,000 mg by mouth 3 (three) times  daily.    ALBUTEROL (VENTOLIN HFA) 108 (90 BASE) MCG/ACT INHALER    Inhale 2 puffs into the lungs daily.   ASCORBIC ACID (VITAMIN C) 500 MG TABLET    Take 1,000 mg by mouth daily.   ASPIRIN 325 MG TABLET    Take 325 mg by mouth every morning.   DOCUSATE SODIUM (COLACE) 100 MG CAPSULE    Take 1 capsule (100 mg total) by mouth 2 (two) times daily.   ESCITALOPRAM (LEXAPRO) 5 MG TABLET    Take 2.5 mg by mouth at bedtime.   FEEDING SUPPLEMENT (ENSURE ENLIVE / ENSURE PLUS) LIQD    Take 237 mLs by mouth 4 (four) times daily.   FERROUS SULFATE 325 (65 FE) MG TABLET    Take 1 tablet (325 mg total) by mouth 2 (two) times daily with a meal.   GABAPENTIN (NEURONTIN) 100 MG CAPSULE    Take 200 mg by mouth 2 (two) times daily.   MIDODRINE (PROAMATINE) 10 MG TABLET    Take 1 tablet (10 mg total) by mouth 2 (two) times daily with a meal.   NUTRITIONAL SUPPLEMENTS (FEEDING SUPPLEMENT, OSMOLITE 1.5 CAL,) LIQD    Place 237 mLs into feeding tube 6 (six) times daily.   OXYCODONE (OXY IR/ROXICODONE) 5 MG IMMEDIATE RELEASE TABLET    Take 0.5 tablets (2.5 mg total) by mouth daily as needed for moderate pain or severe pain.   PANTOPRAZOLE (PROTONIX) 40 MG TABLET  Take 1 tablet (40 mg total) by mouth at bedtime.   POLYETHYLENE GLYCOL (MIRALAX / GLYCOLAX) 17 G PACKET    Take 17 g by mouth every morning. Mix in 6 oz water and drink   SCOPOLAMINE (TRANSDERM-SCOP) 1 MG/3DAYS    Place 1 patch onto the skin every 3 (three) days.   TAMSULOSIN (FLOMAX) 0.4 MG CAPS CAPSULE    Take 1 capsule (0.4 mg total) by mouth daily.  Modified Medications   No medications on file  Discontinued Medications   No medications on file    HPI: Shannon [name] is a 75 y.o. male with severe cervical stenosis complicated by myelopathy and quadrip paresis.  He underwent 2 cervical spine surgeries last October and was eventually discharged to a skilled nursing facility.  His daughter, who is with him today said that he has slowly developed a  decubitus ulcer over the sacrum over the past 4 to 5 months.  He was being fed through a feeding tube but is now eating solid food.  His daughter estimates that he has lost about 40 pounds in the past 5 months.  Notes from his wound care notes indicate that the wounds have "declined" recently.  He says that he has been told that they have seen pus.  He underwent an MRI of his pelvis on 09/12/2020 which showed evidence of sacral osteomyelitis.  He does not believe he has been on any antibiotics recently.  He did have a UTI in January due to Achromobacter that was fairly resistant.  He had a brief course of meropenem and then.  Recent swab wound cultures grew MRSA.  He is not sure if he is on a special bed at his facility.  He is a non-smoker.  Review of Systems: Review of Systems  Constitutional: Positive for weight loss. Negative for chills, diaphoresis and fever.  Gastrointestinal: Negative for abdominal pain, diarrhea, nausea and vomiting.      Past Medical History:  Diagnosis Date  . Asthma   . 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 Newell Coral, 1992-->surg at [facility]  Summer 2021->Cervicalgia,arm pain, hand atrophy RUE wkness, hyperreflex (Dr. 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 Hay fever   . Hypertension   . Osteoarthritis of both hands     Social History   Tobacco Use  . Smoking status: Former Games developer  . Smokeless tobacco: Never Used  Vaping Use  . Vaping Use: Never used  Substance Use Topics  . Alcohol use: Not Currently  . Drug use: Never    Family History  Problem Relation Age of Onset  . Heart attack Mother   . Heart disease Mother   . High blood pressure Mother   . Alcohol abuse Father   . Cancer Father   . Diabetes Father   . High blood pressure Father    Allergies  Allergen Reactions  . Iodine Swelling  . Penicillins  Swelling    ** tolerates cephalosporins Facial swelling, itchy throat    OBJECTIVE: Vitals:   09/19/20 1410  BP: 134/80  Pulse: 80  Temp: 97.8 F (36.6 C)  TempSrc: Oral  SpO2: 97%   There is no height or weight on file to calculate BMI.   Physical Exam Constitutional:      Comments: He is very pleasant and in no distress.  He is accompanied by his daughter and  a close friend.  He is strapped onto a stretcher.  Cardiovascular:     Rate and Rhythm: Normal rate.  Pulmonary:     Effort: Pulmonary effort is normal.  Abdominal:     Palpations: Abdomen is soft.     Tenderness: There is no abdominal tenderness.     Comments: He has a feeding tube.  Musculoskeletal:     Comments: He has a large, deep sacral wound with probable exposed bone in the center.  There is some fecal soiling of the wound.  He has thin bloody drainage on his gauze dressings.  There is no malodor.  Psychiatric:        Mood and Affect: Mood normal.       Microbiology: No results found for this or any previous visit (from the past 240 hour(s)).  Cliffton Asters, MD [facility] [phone] pager   [phone] cell 09/19/2020, 3:03 PM

## 2020-09-19 NOTE — Assessment & Plan Note (Signed)
He has a very large sacral wound complicated by sacral osteomyelitis.  I told them that the only way to have her cure this type of infection and heal this type of wound requires reversing the entire cascade of events that led up to the wound in the first place.  It would require excellent, sustained wound care, offloading pressure, improve nutritional status, consideration of a diverting colostomy and then antibiotic therapy.  This would likely require a PICC and 4 to 6 weeks of IV vancomycin and ertapenem.  When discussing all of this he became tearful and anxious and requested to be taken back to his facility as soon as possible.  He is having quite a bit of pain.  His daughter requested more time to consider their options.  I will arrange a phone visit with him in 48 hours.

## 2020-09-20 ENCOUNTER — Telehealth: Payer: Self-pay

## 2020-09-20 NOTE — Telephone Encounter (Signed)
Per Md called patient to reschedule appointment for 3/31 at 1 pm to earlier time or another day. Spoke with patient's daughter who is not able to reschedule appointment at this time due to work schedule. Rescheduled appointment for 4/7 at 1.  Patient will talk to supervisor to see if she can take time off tomorrow to have appointment done at 10 am. Will call with update. Lorenso Courier, [place]

## 2020-09-21 ENCOUNTER — Telehealth: Payer: Medicare Other | Admitting: Internal Medicine

## 2020-09-21 ENCOUNTER — Other Ambulatory Visit: Payer: Self-pay

## 2020-09-21 ENCOUNTER — Telehealth (INDEPENDENT_AMBULATORY_CARE_PROVIDER_SITE_OTHER): Payer: Medicare Other | Admitting: Internal Medicine

## 2020-09-21 DIAGNOSIS — M4628 Osteomyelitis of vertebra, sacral and sacrococcygeal region: Secondary | ICD-10-CM | POA: Diagnosis not present

## 2020-09-21 NOTE — Progress Notes (Signed)
Virtual Visit via Telephone Note  I connected with Shannon [name] on 09/21/20 at  9:45 AM EDT by telephone and verified that I am speaking with the correct person using two identifiers.  Location: Patient: Home  Provider: [facility]   I discussed the limitations, risks, security and privacy concerns of performing an evaluation and management service by telephone and the availability of in person appointments. I also discussed with the patient that there may be a patient responsible charge related to this service. The patient expressed understanding and agreed to proceed.   History of Present Illness: I conducted a phone visit today with Shannon [name], his daughter and niece.  Initially his daughter said that they wanted to proceed with IV antibiotic therapy.  I went over the current situation with his niece outlining how severe the chronic sacral decubitus ulcer is and that it is now complicated by sacral osteomyelitis.  I reiterated that starting IV antibiotics without making significant improvements in the other factors leading up to his wound would not be helpful.  I also highlighted that IV antibiotics would not be likely to improve his chronic pain.   Observations/Objective:   Assessment and Plan: I encouraged him to continue discussions about Shannon [name] goals of care and to circle back with the palliative care team to get help reviewing treatment options.  What ever they choose to do it sounds like he needs better pain control.  I did talk to them about the option of focusing on comfort care and avoiding any further aggressive interventions.  Follow Up Instructions: We will arrange phone follow-up in 1 week   I discussed the assessment and treatment plan with the patient. The patient was provided an opportunity to ask questions and all were answered. The patient agreed with the plan and demonstrated an understanding of the instructions.   The patient was advised to call back or seek an  in-person evaluation if the symptoms worsen or if the condition fails to improve as anticipated.  I provided 18 minutes of non-face-to-face time during this encounter.   Cliffton Asters, MD

## 2020-09-26 ENCOUNTER — Telehealth: Payer: Self-pay

## 2020-09-26 NOTE — Telephone Encounter (Signed)
Received call from patient's facility stating that he began to have a fever today; tmax 102.2. No other signs of distress. Daughter does want patient to begin antibiotics. Team is requesting orders for antibiotics to be faxed to their facility.   Rosanna Randy, RN   Fax: [phone] [employer]

## 2020-09-27 NOTE — Telephone Encounter (Signed)
RN spoke to Shannon [name], Engineer, civil (consulting) at [facility], to make her aware of new PICC and IV antibiotic orders. Requested that facility fax blood culture results to triage once they are available. RN faxed signed PICC and antibiotic orders to [facility] with receipt of successful fax transmission.   Sandie Ano, RN

## 2020-09-27 NOTE — Telephone Encounter (Signed)
He will need PICC placement.  I recommend 6 weeks of treatment with IV vancomycin per pharmacy protocol and ertapenem 1 g IV daily.  He needs weekly blood work including CBC, BMP, sed rate, C-reactive protein and vancomycin trough levels.  Please asked them to fax blood culture results when they are available.

## 2020-09-27 NOTE — Telephone Encounter (Signed)
Received call from [facility], wound care nurse at [facility]. She states that the in house doctor at [facility] assessed patient yesterday, no signs of distress other than temperature of 102.7 that was reported yesterday. She states fever was treated with Motrin and that blood cultures, CBC, and CMP were ordered yesterday. Victorino Dike would like a call back once antibiotic orders have been determined.   Victorino Dike cell: [phone]  Sandie Ano, RN

## 2020-09-28 ENCOUNTER — Other Ambulatory Visit: Payer: Self-pay

## 2020-09-28 ENCOUNTER — Telehealth: Payer: Medicare Other | Admitting: Internal Medicine

## 2020-09-28 ENCOUNTER — Telehealth (INDEPENDENT_AMBULATORY_CARE_PROVIDER_SITE_OTHER): Payer: Medicare Other | Admitting: Internal Medicine

## 2020-09-28 ENCOUNTER — Telehealth: Payer: Self-pay

## 2020-09-28 ENCOUNTER — Non-Acute Institutional Stay: Payer: No Typology Code available for payment source | Admitting: Student

## 2020-09-28 DIAGNOSIS — M4628 Osteomyelitis of vertebra, sacral and sacrococcygeal region: Secondary | ICD-10-CM

## 2020-09-28 DIAGNOSIS — Z515 Encounter for palliative care: Secondary | ICD-10-CM

## 2020-09-28 NOTE — Progress Notes (Signed)
Virtual Visit via Telephone Note  I connected with Shannon [name] on 09/28/20 at 11:15 AM EDT by telephone and verified that I am speaking with the correct person using two identifiers.  Location: Patient: SNF Provider: [facility]   I discussed the limitations, risks, security and privacy concerns of performing an evaluation and management service by telephone and the availability of in person appointments. I also discussed with the patient that there may be a patient responsible charge related to this service. The patient expressed understanding and agreed to proceed.   History of Present Illness: I spoke with Shannon [name], Shannon [name]'s niece, today.  His daughter, Shannon [name], was supposed to join a call but could not make it.  Shannon [name] states that her uncle is still having severe pain during dressing changes for his large sacral wound.  He recently started having fever and I gave orders for IV vancomycin and ertapenem which has been started.  She tells me that they are supposed to have a visit with the palliative care team this afternoon but she is not sure if that is actually going to take place.  She does not think that there has been any discussion about addressing other factors that have led up to Shannon [name] wound or other discussions of goals of care since his visit here.   Observations/Objective:   Assessment and Plan: I am still skeptical that IV vancomycin and ertapenem will make much difference in his long-term outcome given the difficulty of reversing his bedridden state, malnutrition more consistent offloading of pressure from the wound.  Follow Up Instructions: Continue IV antibiotics for now Shannon [name] is going to try to arrange a follow-up, ongoing discussion with palliative care Phone follow-up 10/10/2020   I discussed the assessment and treatment plan with the patient. The patient was provided an opportunity to ask questions and all were answered. The patient agreed with the plan  and demonstrated an understanding of the instructions.   The patient was advised to call back or seek an in-person evaluation if the symptoms worsen or if the condition fails to improve as anticipated.  I provided 17 minutes of non-face-to-face time during this encounter.   Cliffton Asters, MD

## 2020-09-28 NOTE — Progress Notes (Signed)
[facility] Consult Note Telephone: [phone]  Fax: [phone]  PATIENT NAME: Shannon [name] [address] [place] [address] [phone] (home)  DOB: [birth year] MRN: [id number]  PRIMARY CARE PROVIDER:    Tammi Sou, MD,  [address] [place]  [address] [phone]  REFERRING PROVIDER:   Dr. Keenan Bachelor  RESPONSIBLE PARTY:   Extended Emergency Contact Information Primary Emergency Contact: [place] Phone: [phone] Work Phone: [phone] Mobile Phone: [phone] Relation: Daughter Secondary Emergency Contact: [name] Home Phone: [phone] Mobile Phone: [phone] Relation: Niece Preferred language: English Interpreter needed? No  I met face to face with patient in facility.   ASSESSMENT AND RECOMMENDATIONS:   Advance Care Planning: Visit at the request of Dr. Keenan Bachelor for palliative consult. Visit consisted of building trust and discussions on Palliative care medicine as specialized medical care for people living with serious illness, aimed at facilitating improved quality of life through symptoms relief, assisting with advance care planning and establishing goals of care. Palliative care will continue to provide support to patient, family and the medical team.  Patient with decline since last palliative visit. New dx of sacral osteomyelitis. Palliative NP was to meet with family today. NP at facility. Family not present. NP did speak with niece Shannon [name] on phone, asked to schedule a phone conversation tomorrow with she and patient's daughter Shannon [name]. Phone call scheduled for 115 pm. Will discuss/clarify goals of care, comfort care path. Patient with complex medical history and prognosis appears poor.    Goal of care: To be comfortable.  Directives: MOST form.  Symptom Management:   Pain-Patient with severe pain upon turning/repositioning, dressing changes, stage 4  wound with sacral osteomyelitis present, spondylosis with myelopathy, s/p cervical spinal fusion. Recommend oxycodone be scheduled routinely. Discussed with facility NP. Continue Lyrica, duloxetine, acetaminophen as directed.   Stage 4 sacral wound/sacral osteomyelitis- patient now with PICC line; continue Vancomycin, ertapenem as directed x 6 weeks. PICC line care as directed per facility policy. Daily dressing changes per wound care nurse. Continue air mattress on bed. Pain medications as scheduled; recommend oxycodone be scheduled routinely. Follow up with Wound management and ID as scheduled. Routine labs as directed, CBC, BMP, sed rate, CRP.   Follow up Palliative Care Visit: Palliative care will continue to follow for complex decision making and symptom management. Return in 2 weeks or prn.  Family /Caregiver/Community Supports: Palliative Medicine will continue to provide support.   I spent 35 minutes providing this consultation, time includes time spent with patient/family, chart review, provider coordination, and documentation. More than 50% of the time in this consultation was spent counseling and coordinating communication.   CHIEF COMPLAINT: Palliative Medicine follow up visit.  History obtained from review of EMR, discussion with primary team, and  interview with family. Records reviewed and summarized below.  HISTORY OF PRESENT ILLNESS:  Shannon [name] is a 75 y.o. 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 Palliative Care was asked to follow this patient by consultation request of Dr. Keenan Bachelor to help address advance care planning and goals of care. This is a follow up visit.  Patient with stage 4  sacral wound, complicated by sacral osteomyelitis. He consulted with infectious disease last week; family was undecided on proposed  treatment plan of IV vancomycin x 6 weeks. Nursing staff report patient spiking fevers up to 102 earlier this week. Temperature 99.0 today. He since had PICC line placed and is now receiving Vancomycin every 24 hours x 6 weeks and ertapenem 1gm daily x 6 weeks. Patient had a follow up tele health visit today with ID. Patient is bed bound; lies supine, head of bed elevated, no longer wearing cervical collar. Air mattress on bed. Osmolite 1.5 at 60 ml/hr; no PO nutrition per nursing. Patient denies pain at rest; nursing staff and wound nurse report patient having severe pain with dressing changes, adl care, turning/repositioning. Mobile CXR today per nursing staff.   CODE STATUS: Full Code  PPS: 75%  HOSPICE ELIGIBILITY/DIAGNOSIS: TBD  ROS   Deferred today as patient was unable to adequately contribute to ROS.    Physical Exam:  Constitutional: NAD, lethargic General: ill appearing, only answers yes no questions today EYES: anicteric sclera, lids intact, no discharge  ENMT: intact hearing, oral mucous membranes dry CV: RRR, generalized edema Pulmonary: anterior lobes clear, no increased work of breathing, sats 98% at 2lpm Abdomen: g-tube, BS normoactive x 4 GU: foley catheter intact, amber urine MSK: decreased ROM in all extremities, non ambulatory Skin: clammy; PICC line left upper arm Neuro: Generalized weakness Hem/lymph/immuno: no widespread bruising   PAST MEDICAL HISTORY:  Past Medical History:  Diagnosis Date  . Asthma   . 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 Sherwood Gambler, 1992-->surg at [facility]  Summer 2021->Cervicalgia,arm pain, hand atrophy RUE wkness, hyperreflex (Dr. Rosalita Levan MRI: extensive cord atrophy and spinal and foraminal stenosis->to get surgery 03/2020   . Hay fever   . Hypertension   . Osteoarthritis of both hands     SOCIAL HX:  Social History   Tobacco Use  . Smoking status: Former Research scientist (life sciences)  . Smokeless tobacco: Never Used  Substance Use Topics  . Alcohol use: Not Currently   FAMILY HX:  Family History  Problem Relation Age of Onset  . Heart attack Mother   . Heart disease Mother   . High blood pressure Mother   . Alcohol abuse Father   . Cancer Father   . Diabetes Father   . High blood pressure Father     ALLERGIES:  Allergies  Allergen Reactions  . Iodine Swelling  . Penicillins Swelling    ** tolerates cephalosporins Facial swelling, itchy throat     PERTINENT MEDICATIONS:  Outpatient Encounter Medications as of 09/28/2020  Medication Sig  . acetaminophen (TYLENOL) 500 MG tablet Take 1,000 mg by mouth 3 (three) times daily.   Marland Kitchen albuterol (VENTOLIN HFA) 108 (90 Base) MCG/ACT inhaler Inhale 2 puffs into the lungs daily.  Marland Kitchen ascorbic acid (VITAMIN C) 500 MG tablet Take 1,000 mg by mouth daily.  Marland Kitchen aspirin 325 MG tablet Take 325 mg by mouth every morning.  . docusate sodium (COLACE) 100 MG capsule Take 1 capsule (100 mg total) by mouth 2 (two) times daily.  Marland Kitchen escitalopram (LEXAPRO) 5 MG tablet Take 2.5 mg by mouth at bedtime.  . feeding supplement (ENSURE ENLIVE / ENSURE PLUS) LIQD Take 237 mLs by mouth 4 (four) times daily.  . ferrous sulfate 325 (65 FE) MG tablet Take 1 tablet (325 mg total) by mouth 2 (two) times daily with a meal.  . gabapentin (NEURONTIN) 100 MG capsule Take 200 mg by mouth 2 (two) times daily.  . midodrine (PROAMATINE)  10 MG tablet Take 1 tablet (10 mg total) by mouth 2 (two) times daily with a meal.  . Nutritional Supplements (FEEDING SUPPLEMENT, OSMOLITE 1.5 CAL,) LIQD Place 237 mLs into feeding tube 6 (six) times daily.  Marland Kitchen oxyCODONE (OXY IR/ROXICODONE) 5 MG immediate release tablet Take 0.5 tablets (2.5 mg total) by mouth daily as needed for moderate pain or severe pain.  . pantoprazole  (PROTONIX) 40 MG tablet Take 1 tablet (40 mg total) by mouth at bedtime.  . polyethylene glycol (MIRALAX / GLYCOLAX) 17 g packet Take 17 g by mouth every morning. Mix in 6 oz water and drink  . scopolamine (TRANSDERM-SCOP) 1 MG/3DAYS Place 1 patch onto the skin every 3 (three) days.  . tamsulosin (FLOMAX) 0.4 MG CAPS capsule Take 1 capsule (0.4 mg total) by mouth daily.   No facility-administered encounter medications on file as of 09/28/2020.     Thank you for the opportunity to participate in the care of Mr. Goodwyn. The palliative care team will continue to follow. Please call our office at [phone] if we can be of additional assistance.  Ezekiel Slocumb, NP

## 2020-09-28 NOTE — Telephone Encounter (Signed)
Received call from patient's daughter, [name]. She would like to be the patient's primary point of contact from now on. She is okay with the niece, [name], being updated, but states that she needs to be contacted first in regards to the patient's care. She became very upset in regards to not being contacted today for the patient's phone appointment and is requesting a call from Dr. Orvan Falconer. RN assured her that she will pass the message along, [name] disconnected the call. Will route to provider.   Sandie Ano, RN

## 2020-09-29 ENCOUNTER — Telehealth: Payer: Self-pay | Admitting: Student

## 2020-09-29 NOTE — Telephone Encounter (Signed)
Late entry for 09/29/20 at 130pm. Palliative NP spoke with daughter [name] and niece [name] via phone. Reviewed goals of care; daughter would like for patient to remain comfortable and have symptoms managed. Discussed patient being acutely ill, sacral osteomyelitis and declines/changes in the past month. Palliative NP recommends comfort care/hospice services given his complex medical diagnoses and recent decline in status. Daughter states that she would like to discuss this further with patient when she visits facility over weekend. Patient's MOST form currently reflects aggressive treatments; daughter will discuss and follow up with Palliative on Monday. Opportunity given to ask questions; support provided.

## 2020-10-03 ENCOUNTER — Non-Acute Institutional Stay: Payer: No Typology Code available for payment source | Admitting: Student

## 2020-10-03 ENCOUNTER — Other Ambulatory Visit: Payer: Self-pay

## 2020-10-03 DIAGNOSIS — Z515 Encounter for palliative care: Secondary | ICD-10-CM

## 2020-10-03 DIAGNOSIS — M4628 Osteomyelitis of vertebra, sacral and sacrococcygeal region: Secondary | ICD-10-CM

## 2020-10-03 DIAGNOSIS — L89154 Pressure ulcer of sacral region, stage 4: Secondary | ICD-10-CM

## 2020-10-03 DIAGNOSIS — R52 Pain, unspecified: Secondary | ICD-10-CM

## 2020-10-03 NOTE — Progress Notes (Signed)
[facility] Consult Note Telephone: [phone]  Fax: [phone]    Date of encounter: 10/03/20 PATIENT NAME: Shannon [name] [id number]   [phone] (home)  DOB: 1946/04/02 MRN: [id number] PRIMARY CARE PROVIDER:    Dr. Clovis Riley PROVIDER:   Dr. Keenan Bachelor  RESPONSIBLE PARTY:    Contact Information    Name Relation Home Work Mobile   Speers,[name] Daughter [phone]   [name] Niece [phone]  [phone]   [name] [phone]  [phone]       I met face to face with patient in the facility. Palliative Care was asked to follow this patient by consultation request of  Dr. Keenan Bachelor to address advance care planning and complex medical decision making. This is a follow up visit.                                   ASSESSMENT AND PLAN / RECOMMENDATIONS:   Advance Care Planning/Goals of Care: Goals include to maximize quality of life, comfort and symptom management. Our advance care planning conversation included a discussion about:     The value and importance of advance care planning   Experiences with loved ones who have been seriously ill or have died   Exploration of personal, cultural or spiritual beliefs that might influence medical decisions   Exploration of goals of care in the event of a sudden injury or illness   Identification and preparation of a healthcare agent   Review and updating or creation of an  advance directive document   CODE STATUS: Full Code  NP has left message for daughter [name] to follow up on conversation last week regarding updating MOST form, goals of care.  Symptom Management/Plan:   Stage 4 sacral wound/sacral osteomyelitis- patient  with PICC line; continue Vancomycin, ertapenem as directed x 6 weeks. PICC line care as directed per facility policy. Daily dressing changes per wound care nurse.  Continue air mattress on bed. PT working with patient on purap mattress system. Follow up with Wound management and ID as scheduled.   Pain-complaint of pain upon turning/repositioning, dressing changes, stage 4 wound with sacral osteomyelitis present, spondylosis with myelopathy, s/p cervical spinal fusion. Continue oxycodone, Lyrica, duloxetine, acetaminophen as directed.    Follow up Palliative Care Visit: Palliative care will continue to follow for complex medical decision making, advance care planning, and clarification of goals. Return in 4 weeks or prn.  I spent 35 minutes providing this consultation. More than 50% of the time in this consultation was spent in counseling and care coordination.  PPS: 30%  HOSPICE ELIGIBILITY/DIAGNOSIS: TBD  Chief Complaint: Palliative Medicine follow up visit, sacral osteomyelitis.  HISTORY OF PRESENT ILLNESS:  Shannon [name] is a 75 y.o. 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   Patient with stage 4 sacral wound, complicated by sacral osteomyelitis. Patient continues on IV vancomycin and ertapenem. He denies pain today; he states his pain has been stable. PT is currently working with patient on purap mattress system; patient also has air mattress on bed.   History obtained from review of EMR, discussion with primary team, and interview with family, facility staff/caregiver and/or Mr.  Gabay.  I reviewed available labs, medications, imaging, studies and related documents from the EMR.  Records reviewed and summarized above.   ROS General: NAD EYES: denies vision changes ENMT: dysphagia, not receiving any nutrition by mouth Cardiovascular: denies chest pain, denies DOE Pulmonary: denies cough,  denies increased SOB Abdomen: incontinence of bowel GU: denies dysuria, foley MSK:  weakness,  no falls reported Skin: wound to sacrum and left foot Neurological: denies pain, denies insomnia Psych: Endorses stable mood Heme/lymph/immuno: denies bruises, abnormal bleeding  Physical Exam: Weight: 09/25/20: 189 pounds  Constitutional: NAD General: ill appearing, more alert today, responds appropriately EYES: anicteric sclera, lids intact, no discharge  ENMT: intact hearing, oral mucous membranes dry, dentition intact CV: S1S2, RRR, generalized edema Pulmonary: LCTA, no increased work of breathing, no cough, sats 98% at 2lpm Abdomen: g-tube, normo-active BS + 4 quadrants, soft and non tender,  GU: foley catheter; amber urine in drainage bag MSK: decreased ROM in all extremities, non-ambulatory Skin: cool and dry, PICC line left upper arm, sacral wound not visualized Neuro: generalized weakness,   Psych: non-anxious affect, A and O x 3 Hem/lymph/immuno: no widespread bruising   Thank you for the opportunity to participate in the care of Mr. Asmus.  The palliative care team will continue to follow. Please call our office at [phone] if we can be of additional assistance.    S , NP   COVID-19 PATIENT SCREENING TOOL Asked and negative response unless otherwise noted:   Have you had symptoms of covid, tested positive or been in contact with someone with symptoms/positive test in the past 5-10 days? No  

## 2020-10-09 NOTE — Telephone Encounter (Signed)
I called her on 09/28/2020 and left a message on her phone asking her to call back.

## 2020-10-10 ENCOUNTER — Telehealth: Payer: Self-pay

## 2020-10-10 ENCOUNTER — Other Ambulatory Visit: Payer: Self-pay

## 2020-10-10 ENCOUNTER — Telehealth: Payer: Medicare Other | Admitting: Internal Medicine

## 2020-10-10 NOTE — Telephone Encounter (Signed)
Attempted to call patient's daughter multiple times to begin e-visit with provider. Called Mobile number and work number, but not able to reach her at this time. Left multiple messages (3:34,3:48,3:52,4:01). Will need to reschedule appointment when available. Did receive call from Chad (Niece) at 3:49 to begin e-visit. However, per daughter all information regarding patient will need to be addressed to her. Relayed this to [place] who will reach out to [place].

## 2020-10-17 ENCOUNTER — Other Ambulatory Visit: Payer: Self-pay

## 2020-10-17 ENCOUNTER — Non-Acute Institutional Stay: Payer: No Typology Code available for payment source | Admitting: Student

## 2020-10-17 DIAGNOSIS — M4628 Osteomyelitis of vertebra, sacral and sacrococcygeal region: Secondary | ICD-10-CM

## 2020-10-17 DIAGNOSIS — R52 Pain, unspecified: Secondary | ICD-10-CM

## 2020-10-17 DIAGNOSIS — Z515 Encounter for palliative care: Secondary | ICD-10-CM

## 2020-10-17 NOTE — Progress Notes (Signed)
  [facility] Consult Note Telephone: [phone]  Fax: [phone]    Date of encounter: 10/17/20 PATIENT NAME: Shannon [name] [address] [place]  [address]   [phone] (home)  DOB: 07/27/1945 MRN: [id number] PRIMARY CARE PROVIDER:    Dr. Blake  REFERRING PROVIDER:   Dr. Blake  RESPONSIBLE PARTY:    Contact Information    Name Relation Home Work Mobile   Ferrell,Shannon [name] Daughter [phone]   Agard, Shannon [name] Niece [phone]  [phone]   Neal,Shannon Friend [phone]  [phone]       I met face to face with patient and family in the facility. Niece Shannon [name] is present. Palliative Care was asked to follow this patient by consultation request of  McGowen, Philip H, MD to address advance care planning and complex medical decision making. This is a follow up visit.                                   ASSESSMENT AND PLAN / RECOMMENDATIONS:   Advance Care Planning/Goals of Care: Goals include to maximize quality of life and symptom management. Our advance care planning conversation included a discussion about:     The value and importance of advance care planning   Experiences with loved ones who have been seriously ill or have died   Exploration of personal, cultural or spiritual beliefs that might influence medical decisions   Exploration of goals of care in the event of a sudden injury or illness   Identification and preparation of a healthcare agent   Review and updating or creation of an  advance directive document  Decision not to resuscitate or to de-escalate disease focused treatments due to poor prognosis.  CODE STATUS: Full Code  Discussed comfort path vs. Continuing aggressive treatments. Patient expresses wanting to be comfortable, but is unclear on continuing aggressive treatments. During visit, he states that he could "get rid of the IV's." He expresses wanting  to go home. Conversation approached regarding hospice services, explained the additional support he could receive if he decides comfort path; although the word "hospice" not used today as it did not seem appropriate as patient was pondering his current state of health and not to overwhelm patient. Patient expresses being a man of faith and hope. Patient's daughter Shannon [name] not present today; she has not returned call to NP to continue ongoing conversation. Niece Shannon [name] is attempting to set up a phone call on Thursday at 115pm to continue conversation.   Symptom Management/Plan:  Stage 4 sacral wound/sacral osteomyelitis- patient  with PICC line; continue Vancomycin, ertapenem as directed x 6 weeks. PICC line care as directed per facility policy. Daily dressing changes per wound care nurse. Continue air mattress on bed. PT working with patient on purap mattress system. Follow up with Wound management and ID as scheduled.   Pain-complaint of pain upon turning/repositioning, dressing changes, stage 4 wound with sacral osteomyelitis present,spondylosis with myelopathy, s/p cervical spinal fusion. Continue oxycodone, Lyrica, duloxetine, acetaminophen as directed.  Follow up Palliative Care Visit: Palliative care will continue to follow for complex medical decision making, advance care planning, and clarification of goals. Return in 4 weeks or prn.  I spent 35 minutes providing this consultation. More than 50% of the time in this consultation was spent in counseling and care coordination.   PPS: 30%  HOSPICE ELIGIBILITY/DIAGNOSIS: TBD  Chief Complaint: Palliative Medicine follow up,   sacral osteomyelitis.  HISTORY OF PRESENT ILLNESS:  Shannon [name] is a 75 y.o. year old male  sacral wound-stage 4; sacral wound complicated by osteomyelitis. Diagnoses also include stage 3 to left foot,spondylosis with myelopathy, s/p cervical spinal fusion, quadriparesis, dysphagia, oropharyngeal phase, muscle  weakness, cerebrovascular disease, muscle wasting and atrophy, epidural hemorrhage, hx of respiratory failure with hypoxia.  Patient is currently receiving IV Vancomycin and ertapenem. He is eating little by mouth, taking some PO fluids; nutrition primarily through G-tube. He states his pain has been stable. PT is currently working with patient on purap mattress system; patient also has air mattress on bed. Wound nurse states his wound is actually improving and credits new purap mattress that helps offload. Intermittent confusion reported per niece and staff. Patient with hematuria on 4/19; aspirin discontinued. CBC collected on 4/19. hgb 8.4, hct 26.8, wbc 7.7, rbc 3.56, platelets 289. Patient missed recent follow up appointment with ID.   History obtained from review of EMR, discussion with primary team, and interview with family, facility staff/caregiver and/or Shannon [name].  I reviewed available labs, medications, imaging, studies and related documents from the EMR.  Records reviewed and summarized above.   ROS  General: NAD EYES: denies vision changes ENMT: dysphagia Cardiovascular: denies chest pain, denies DOE Pulmonary: denies cough, denies increased SOB Abdomen: denies constipation GU: denies dysuria, foley MSK: weakness, no falls reported Skin: wounds to sacrum, left foot Neurological:  denies insomnia Psych: Endorses stable mood Heme/lymph/immuno: denies bruises  Physical Exam: Weight: 186 pounds on 10/06/20 Constitutional: NAD General: frail appearing, ill appearing EYES: anicteric sclera, lids intact, no discharge  ENMT: intact hearing, oral mucous membranes moist, grinds teeth CV: S1S2, RRR, generalized edema Pulmonary: LCTA, no increased work of breathing, no cough, sats 96% at 2lpm Abdomen: g-tube, normo-active BS + 4 quadrants, soft and non tender GU: foley catheter; hematuria in drainage bag MSK: decreased ROM all extremities, non-ambulatory Skin: cool to touch, PICC  line left upper arm; wounds not visualized Neuro: generalized weakness Psych: non-anxious affect, A & O x 2 Hem/lymph/immuno: no widespread bruising   Thank you for the opportunity to participate in the care of Shannon [name].  The palliative care team will continue to follow. Please call our office at [phone] if we can be of additional assistance.    S , NP   COVID-19 PATIENT SCREENING TOOL Asked and negative response unless otherwise noted:   Have you had symptoms of covid, tested positive or been in contact with someone with symptoms/positive test in the past 5-10 days? No  

## 2020-10-19 ENCOUNTER — Telehealth: Payer: Self-pay | Admitting: Student

## 2020-10-19 NOTE — Telephone Encounter (Signed)
Palliative NP and niece [name] were to meet via phone with patient's daughter [name] to discuss goals of care as patient continues to decline. No response from daughter. Palliative Medicine will continue to provide services.

## 2020-10-20 ENCOUNTER — Telehealth: Payer: Self-pay | Admitting: Student

## 2020-10-20 NOTE — Telephone Encounter (Signed)
Palliative NP able to speak with daughter [name] regarding patient. We discussed patient continues to be ill, in spite of current IV antibiotics. Patient had expressed earlier this week that they could take away IV's. She is in agreement with Hospice care; she wants her dad to be comfortable. She is made aware that patient would need to switch over to PO antibiotics; she is in agreement with this. Addressed code status; she would like for patient to remain a full code. Explained what this would look like for patient. Discussed quality of life; he will remain a Full Code.  NP reached out to facility and SW regarding getting patient on Hospice; awaiting response. Daughter is made aware.

## 2020-11-12 ENCOUNTER — Other Ambulatory Visit: Payer: Self-pay

## 2020-11-12 ENCOUNTER — Emergency Department (HOSPITAL_COMMUNITY)

## 2020-11-12 ENCOUNTER — Inpatient Hospital Stay (HOSPITAL_COMMUNITY)
Admission: EM | Admit: 2020-11-12 | Discharge: 2020-11-18 | DRG: 662 | Disposition: A | Discharge status: Skilled Nursing Facility | Source: Skilled Nursing Facility | Attending: Internal Medicine | Admitting: Internal Medicine

## 2020-11-12 DIAGNOSIS — N400 Enlarged prostate without lower urinary tract symptoms: Secondary | ICD-10-CM | POA: Diagnosis present

## 2020-11-12 DIAGNOSIS — Z833 Family history of diabetes mellitus: Secondary | ICD-10-CM

## 2020-11-12 DIAGNOSIS — N5089 Other specified disorders of the male genital organs: Secondary | ICD-10-CM | POA: Diagnosis present

## 2020-11-12 DIAGNOSIS — W19XXXS Unspecified fall, sequela: Secondary | ICD-10-CM | POA: Diagnosis present

## 2020-11-12 DIAGNOSIS — D509 Iron deficiency anemia, unspecified: Secondary | ICD-10-CM | POA: Diagnosis present

## 2020-11-12 DIAGNOSIS — E538 Deficiency of other specified B group vitamins: Secondary | ICD-10-CM | POA: Diagnosis present

## 2020-11-12 DIAGNOSIS — R319 Hematuria, unspecified: Secondary | ICD-10-CM

## 2020-11-12 DIAGNOSIS — N139 Obstructive and reflux uropathy, unspecified: Secondary | ICD-10-CM | POA: Diagnosis present

## 2020-11-12 DIAGNOSIS — D638 Anemia in other chronic diseases classified elsewhere: Secondary | ICD-10-CM | POA: Diagnosis present

## 2020-11-12 DIAGNOSIS — Z978 Presence of other specified devices: Secondary | ICD-10-CM

## 2020-11-12 DIAGNOSIS — T8383XA Hemorrhage of genitourinary prosthetic devices, implants and grafts, initial encounter: Principal | ICD-10-CM | POA: Diagnosis present

## 2020-11-12 DIAGNOSIS — Z7401 Bed confinement status: Secondary | ICD-10-CM | POA: Diagnosis not present

## 2020-11-12 DIAGNOSIS — I9589 Other hypotension: Secondary | ICD-10-CM | POA: Diagnosis not present

## 2020-11-12 DIAGNOSIS — M4628 Osteomyelitis of vertebra, sacral and sacrococcygeal region: Secondary | ICD-10-CM | POA: Diagnosis present

## 2020-11-12 DIAGNOSIS — R310 Gross hematuria: Secondary | ICD-10-CM | POA: Diagnosis present

## 2020-11-12 DIAGNOSIS — Z79899 Other long term (current) drug therapy: Secondary | ICD-10-CM

## 2020-11-12 DIAGNOSIS — I10 Essential (primary) hypertension: Secondary | ICD-10-CM | POA: Diagnosis present

## 2020-11-12 DIAGNOSIS — G8250 Quadriplegia, unspecified: Secondary | ICD-10-CM | POA: Diagnosis present

## 2020-11-12 DIAGNOSIS — E8809 Other disorders of plasma-protein metabolism, not elsewhere classified: Secondary | ICD-10-CM | POA: Diagnosis present

## 2020-11-12 DIAGNOSIS — E43 Unspecified severe protein-calorie malnutrition: Secondary | ICD-10-CM | POA: Diagnosis present

## 2020-11-12 DIAGNOSIS — N319 Neuromuscular dysfunction of bladder, unspecified: Secondary | ICD-10-CM | POA: Diagnosis present

## 2020-11-12 DIAGNOSIS — L89021 Pressure ulcer of left elbow, stage 1: Secondary | ICD-10-CM | POA: Diagnosis present

## 2020-11-12 DIAGNOSIS — F32A Depression, unspecified: Secondary | ICD-10-CM | POA: Diagnosis present

## 2020-11-12 DIAGNOSIS — N138 Other obstructive and reflux uropathy: Secondary | ICD-10-CM | POA: Diagnosis present

## 2020-11-12 DIAGNOSIS — Y846 Urinary catheterization as the cause of abnormal reaction of the patient, or of later complication, without mention of misadventure at the time of the procedure: Secondary | ICD-10-CM | POA: Diagnosis present

## 2020-11-12 DIAGNOSIS — L89154 Pressure ulcer of sacral region, stage 4: Secondary | ICD-10-CM | POA: Diagnosis present

## 2020-11-12 DIAGNOSIS — Z981 Arthrodesis status: Secondary | ICD-10-CM

## 2020-11-12 DIAGNOSIS — Z7982 Long term (current) use of aspirin: Secondary | ICD-10-CM

## 2020-11-12 DIAGNOSIS — D519 Vitamin B12 deficiency anemia, unspecified: Secondary | ICD-10-CM | POA: Diagnosis present

## 2020-11-12 DIAGNOSIS — R578 Other shock: Secondary | ICD-10-CM | POA: Diagnosis present

## 2020-11-12 DIAGNOSIS — Z6830 Body mass index (BMI) 30.0-30.9, adult: Secondary | ICD-10-CM | POA: Diagnosis not present

## 2020-11-12 DIAGNOSIS — Z87891 Personal history of nicotine dependence: Secondary | ICD-10-CM

## 2020-11-12 DIAGNOSIS — Z20822 Contact with and (suspected) exposure to covid-19: Secondary | ICD-10-CM | POA: Diagnosis present

## 2020-11-12 DIAGNOSIS — Y92129 Unspecified place in nursing home as the place of occurrence of the external cause: Secondary | ICD-10-CM

## 2020-11-12 DIAGNOSIS — D62 Acute posthemorrhagic anemia: Secondary | ICD-10-CM | POA: Diagnosis present

## 2020-11-12 DIAGNOSIS — S14123S Central cord syndrome at C3 level of cervical spinal cord, sequela: Secondary | ICD-10-CM

## 2020-11-12 DIAGNOSIS — Z931 Gastrostomy status: Secondary | ICD-10-CM

## 2020-11-12 DIAGNOSIS — E8770 Fluid overload, unspecified: Secondary | ICD-10-CM | POA: Diagnosis not present

## 2020-11-12 DIAGNOSIS — S3739XA Other injury of urethra, initial encounter: Secondary | ICD-10-CM | POA: Diagnosis present

## 2020-11-12 DIAGNOSIS — Z8249 Family history of ischemic heart disease and other diseases of the circulatory system: Secondary | ICD-10-CM

## 2020-11-12 DIAGNOSIS — D649 Anemia, unspecified: Secondary | ICD-10-CM | POA: Diagnosis present

## 2020-11-12 LAB — CBC WITH DIFFERENTIAL/PLATELET
Abs Immature Granulocytes: 0.06 10*3/uL (ref 0.00–0.07)
Basophils Absolute: 0 10*3/uL (ref 0.0–0.1)
Basophils Relative: 0 %
Eosinophils Absolute: 0 10*3/uL (ref 0.0–0.5)
Eosinophils Relative: 0 %
HCT: 26.6 % — ABNORMAL LOW (ref 39.0–52.0)
Hemoglobin: 7.1 g/dL — ABNORMAL LOW (ref 13.0–17.0)
Immature Granulocytes: 1 %
Lymphocytes Relative: 8 %
Lymphs Abs: 0.6 10*3/uL — ABNORMAL LOW (ref 0.7–4.0)
MCH: 21.5 pg — ABNORMAL LOW (ref 26.0–34.0)
MCHC: 26.7 g/dL — ABNORMAL LOW (ref 30.0–36.0)
MCV: 80.6 fL (ref 80.0–100.0)
Monocytes Absolute: 0.8 10*3/uL (ref 0.1–1.0)
Monocytes Relative: 10 %
Neutro Abs: 6.2 10*3/uL (ref 1.7–7.7)
Neutrophils Relative %: 81 %
Platelets: 264 10*3/uL (ref 150–400)
RBC: 3.3 MIL/uL — ABNORMAL LOW (ref 4.22–5.81)
RDW: 17.2 % — ABNORMAL HIGH (ref 11.5–15.5)
WBC: 7.7 10*3/uL (ref 4.0–10.5)
nRBC: 0 % (ref 0.0–0.2)

## 2020-11-12 LAB — URINALYSIS, ROUTINE W REFLEX MICROSCOPIC

## 2020-11-12 LAB — COMPREHENSIVE METABOLIC PANEL
ALT: 10 U/L (ref 0–44)
AST: 24 U/L (ref 15–41)
Albumin: 2.1 g/dL — ABNORMAL LOW (ref 3.5–5.0)
Alkaline Phosphatase: 113 U/L (ref 38–126)
Anion gap: 8 (ref 5–15)
BUN: 35 mg/dL — ABNORMAL HIGH (ref 8–23)
CO2: 25 mmol/L (ref 22–32)
Calcium: 8.6 mg/dL — ABNORMAL LOW (ref 8.9–10.3)
Chloride: 102 mmol/L (ref 98–111)
Creatinine, Ser: 0.65 mg/dL (ref 0.61–1.24)
GFR, Estimated: >60 mL/min (ref >60)
Glucose, Bld: 175 mg/dL — ABNORMAL HIGH (ref 70–99)
Potassium: 3.9 mmol/L (ref 3.5–5.1)
Sodium: 135 mmol/L (ref 135–145)
Total Bilirubin: 0.5 mg/dL (ref 0.3–1.2)
Total Protein: 7.9 g/dL (ref 6.5–8.1)

## 2020-11-12 LAB — URINALYSIS, MICROSCOPIC (REFLEX): RBC / HPF: >50 RBC/hpf (ref 0–5)

## 2020-11-12 LAB — PROTIME-INR
INR: 1.2 (ref 0.8–1.2)
Prothrombin Time: 15.6 seconds — ABNORMAL HIGH (ref 11.4–15.2)

## 2020-11-12 IMAGING — CT CT RENAL STONE PROTOCOL
3 of 5 series · 16 of 46 positions shown, 18 images · non-contrast
Comparison: [DATE]

CLINICAL DATA: Hematuria following Foley catheter replacement.

EXAM:
CT ABDOMEN AND PELVIS WITHOUT CONTRAST
TECHNIQUE: Multidetector CT imaging of the abdomen and pelvis was performed
following the standard protocol without IV contrast.

[Series 2: axial st · axial · 0.87mm/px · z∈[-426,+24]mm · 10 of 112 slices shown, 12 images]
[im 11/112  soft-tissue]
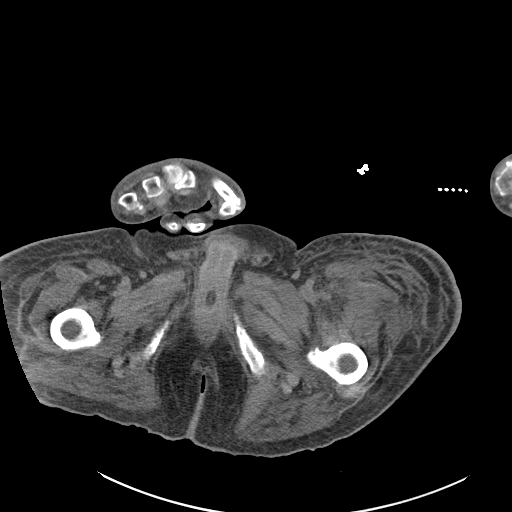
[im 11/112  bone]
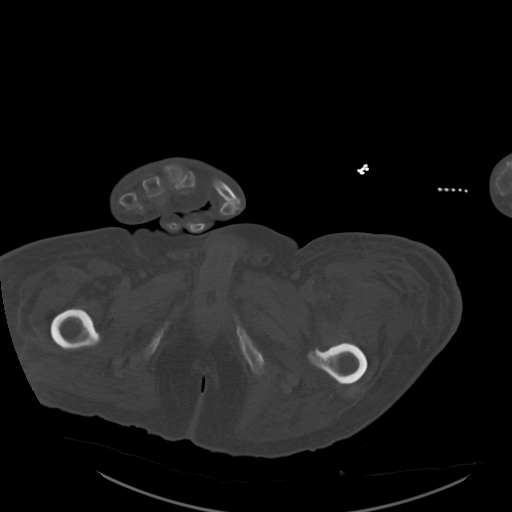
[im 21/112  soft-tissue]
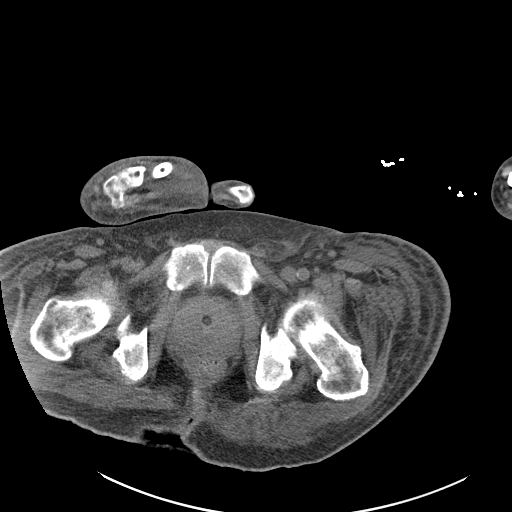
[im 31/112  soft-tissue]
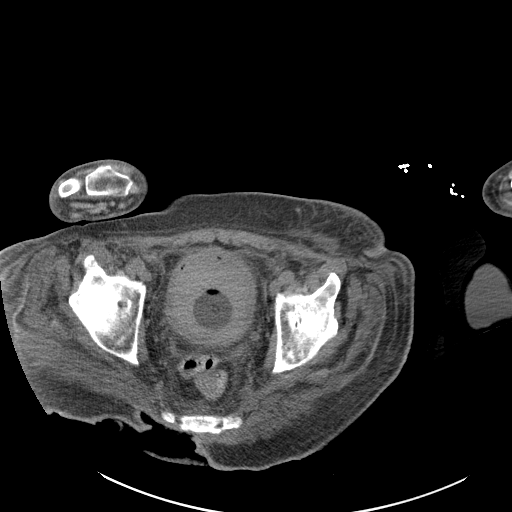
[im 41/112  soft-tissue]
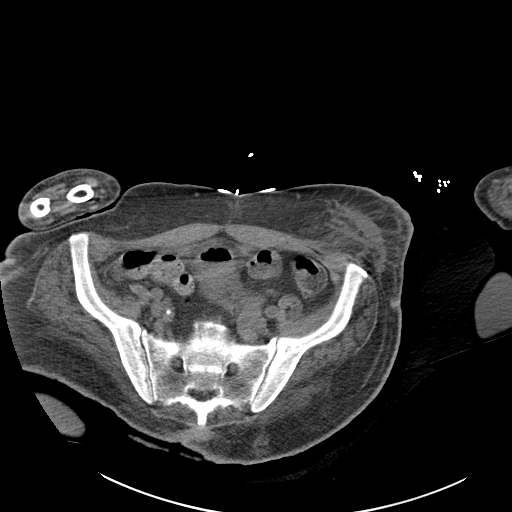
[im 51/112  soft-tissue]
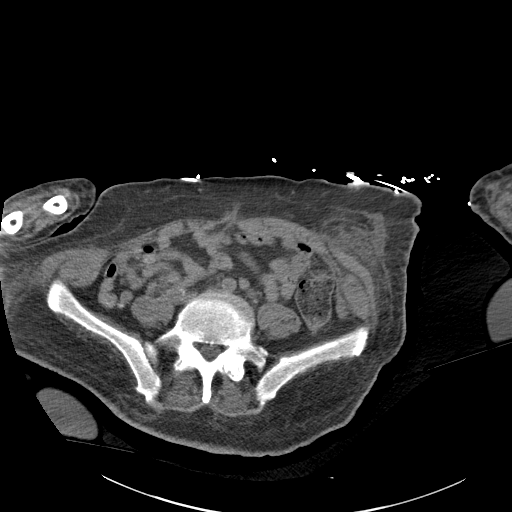
[im 61/112  soft-tissue]
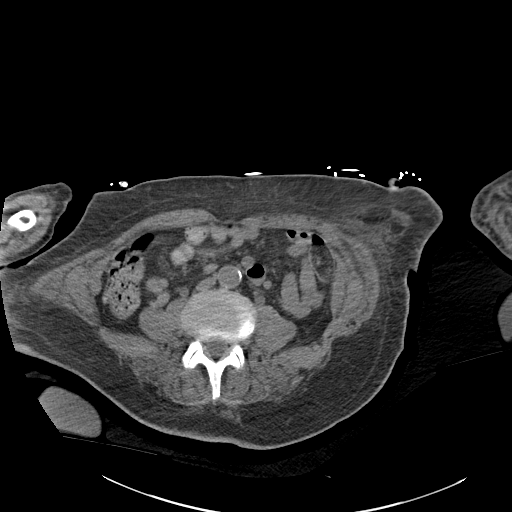
[im 71/112  soft-tissue]
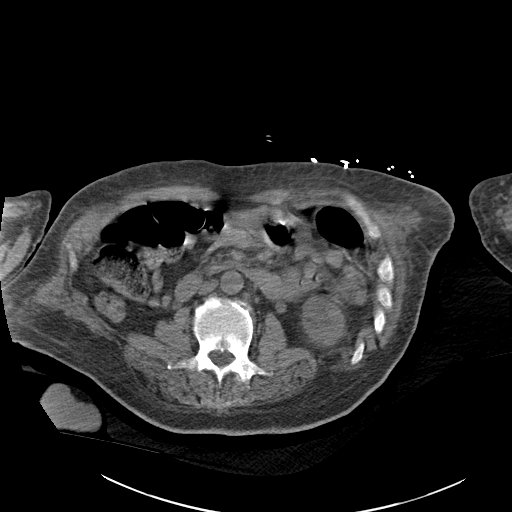
[im 81/112  soft-tissue]
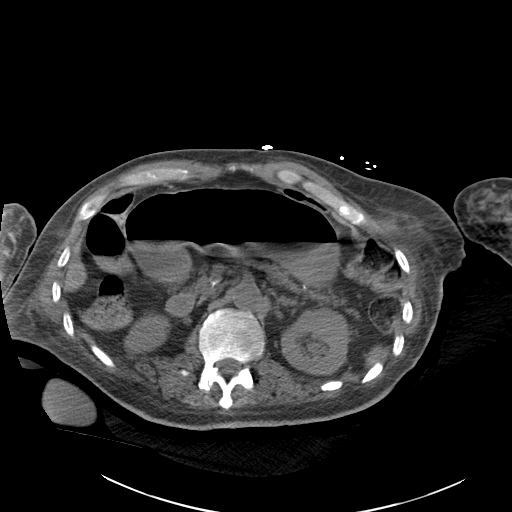
[im 91/112  soft-tissue]
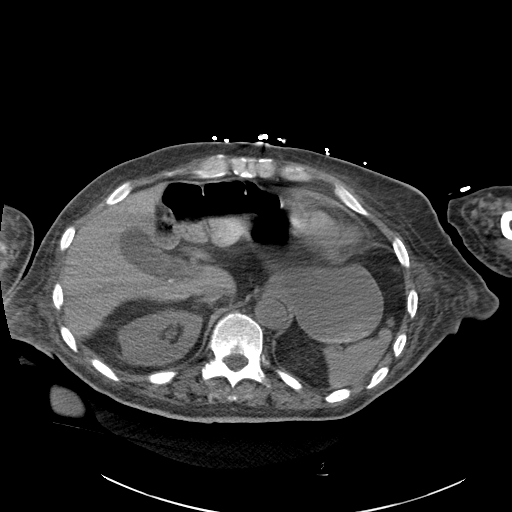
[im 91/112  bone]
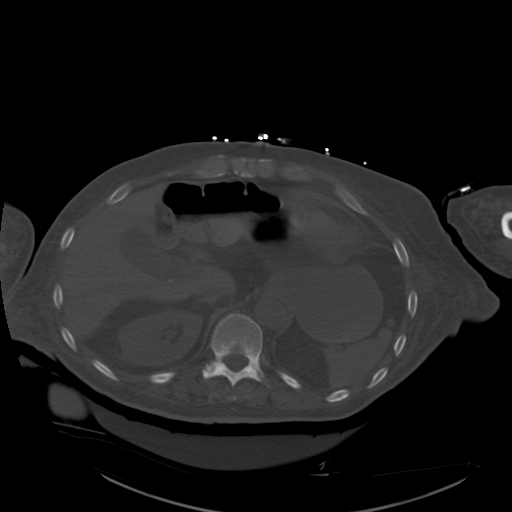
[im 101/112  soft-tissue]
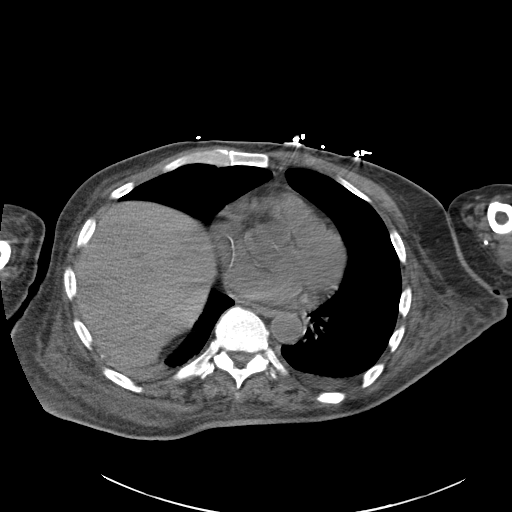

[Series 5: lung bases · axial · 0.97mm/px · z∈[-83,-37]mm · 3 of 93 slices shown]
[im 12/93  bone]
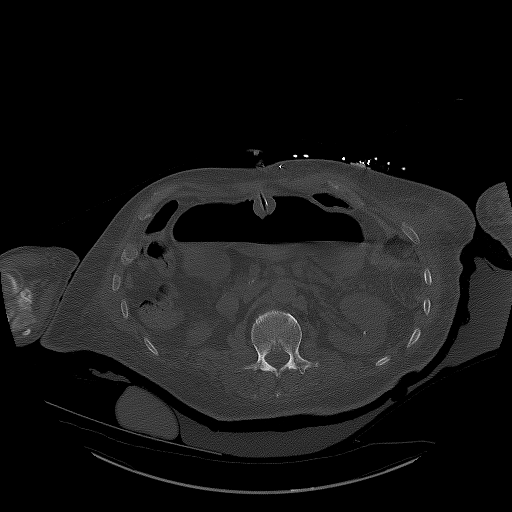
[im 24/93  bone]
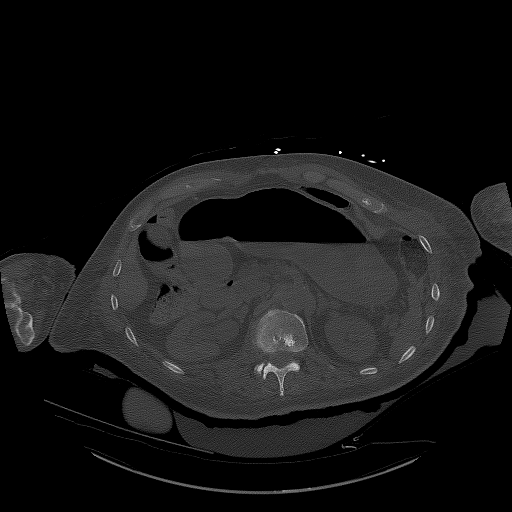
[im 35/93  bone]
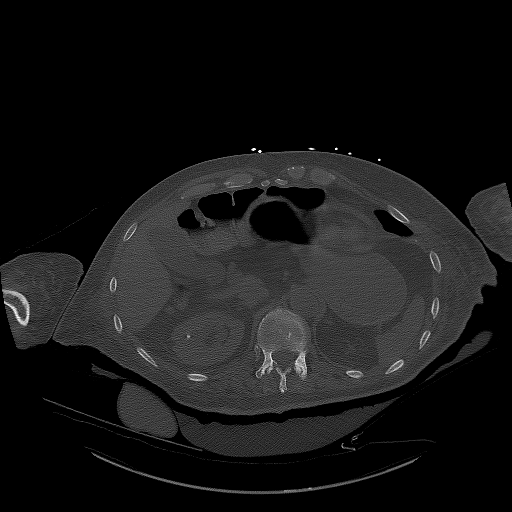

[Series 6: coronal · coronal · 1.00mm/px · 3 of 156 slices shown]
[im 52/156  soft-tissue]
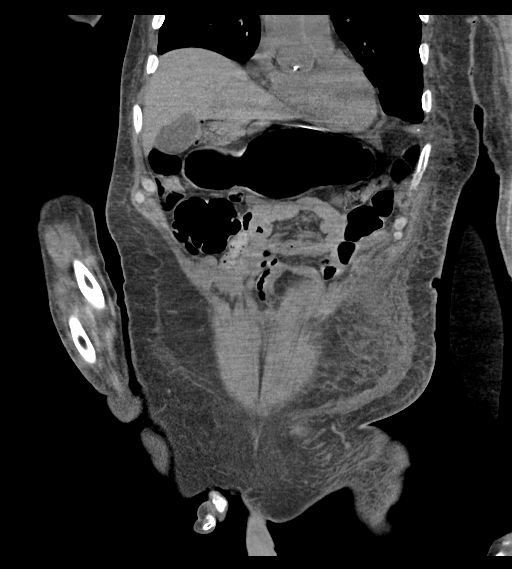
[im 69/156  soft-tissue]
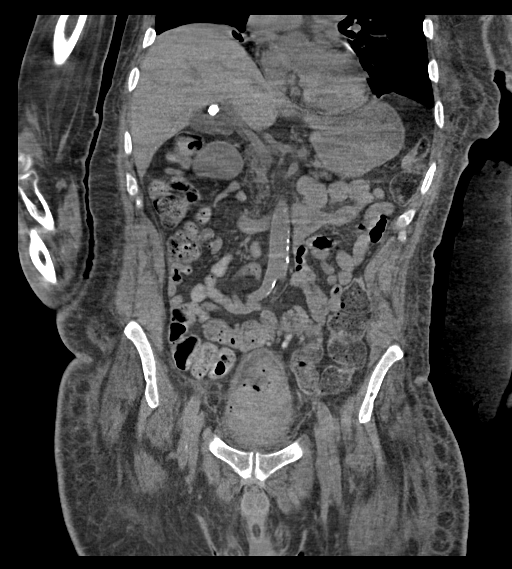
[im 87/156  soft-tissue]
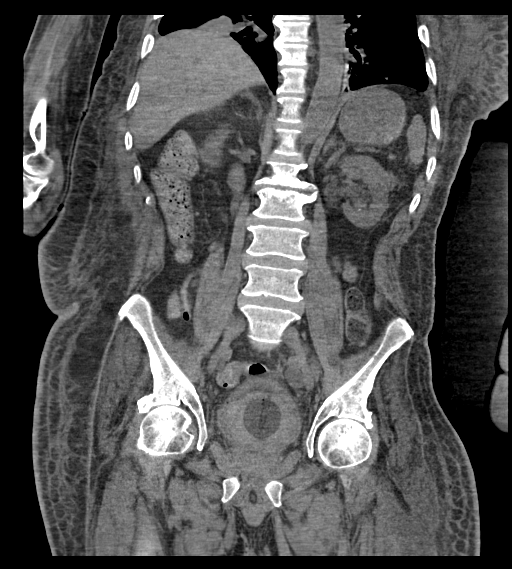

[16 of 46 positions shown; findings below may reference images not displayed]

FINDINGS: Lower chest: There is elevation of the right hemidiaphragm with
moderate severity atelectasis and/or infiltrate seen within the
posterior aspect of the bilateral lung bases.

A small left pleural effusion is seen.

Hepatobiliary: No focal liver abnormality is seen. A 1.0 cm
gallstone is seen within the lumen of an otherwise normal-appearing
gallbladder.

Pancreas: Unremarkable. No pancreatic ductal dilatation or
surrounding inflammatory changes.

Spleen: Normal in size without focal abnormality.

Adrenals/Urinary Tract: Adrenal glands are unremarkable. Kidneys are
normal in size, bilaterally. A 4 mm nonobstructing renal stone is
seen within the lateral aspect of the mid right kidney. 3 mm
nonobstructing renal stones are seen within the posterior aspect of
the mid left kidney. There is mild to moderate severity bilateral
hydronephrosis and hydroureter, without evidence of obstructing
renal stones.

An 8 mm calcification is seen within the posterolateral wall of the
urinary bladder on the left (axial CT image 84, CT series number 2).
This is adjacent to the left UVJ. A Foley catheter, a moderate
amount of mildly increased attenuation (approximately
Hounsfield units) and a mild amount of air are seen within the
bladder lumen.

Stomach/Bowel: A percutaneous gastrostomy tube is in place. Its
distal tip and insufflator bulb are seen within the body of the
stomach. Appendix appears normal. No evidence of bowel dilatation.
Noninflamed diverticula are seen within the large bowel.

Vascular/Lymphatic: Aortic atherosclerosis. No enlarged abdominal or
pelvic lymph nodes.

Reproductive: There is mild to moderate severity prostate gland
enlargement.

Other: Mild to moderate severity anasarca is seen throughout the
abdominal and pelvic walls.

No abdominopelvic ascites.

Musculoskeletal: Degenerative changes seen throughout the lumbar
spine.
IMPRESSION: 1. Moderate amount of acute blood products within the urinary
bladder lumen, likely secondary to sequelae associated with the
patient's recent Foley catheter replacement.
2. Bilateral subcentimeter nonobstructing renal calculi.
3. Moderate severity bibasilar atelectasis and/or infiltrate.
4. Cholelithiasis.
5. Colonic diverticulosis.

## 2020-11-12 MED ORDER — SODIUM CHLORIDE 0.9 % IV BOLUS
1000.0000 mL | Freq: Once | INTRAVENOUS | Status: AC
  Administered 2020-11-12: 1000 mL via INTRAVENOUS

## 2020-11-12 MED ORDER — SODIUM CHLORIDE 0.9 % IR SOLN
3000.0000 mL | Status: DC
  Administered 2020-11-12: 3000 mL

## 2020-11-12 MED ORDER — FENTANYL CITRATE (PF) 100 MCG/2ML IJ SOLN
50.0000 ug | Freq: Once | INTRAMUSCULAR | Status: AC
  Administered 2020-11-12: 50 ug via INTRAVENOUS
  Filled 2020-11-12: qty 2

## 2020-11-12 MED ORDER — SODIUM CHLORIDE 0.9 % IV SOLN: Freq: Once | INTRAVENOUS | Status: AC

## 2020-11-12 MED ORDER — LACTATED RINGERS IV SOLN: INTRAVENOUS | Status: AC

## 2020-11-12 MED ORDER — SODIUM CHLORIDE 0.9 % IV SOLN: 10.0000 mL/h | Freq: Once | INTRAVENOUS | Status: DC

## 2020-11-12 MED ORDER — LIDOCAINE HCL URETHRAL/MUCOSAL 2 % EX GEL
1.0000 "application " | Freq: Once | CUTANEOUS | Status: AC | PRN
  Administered 2020-11-12: 1 via URETHRAL
  Filled 2020-11-12: qty 11

## 2020-11-12 NOTE — ED Triage Notes (Addendum)
Patient arrives from [facility]. Patient is a quadriplegic and is a chronic foley user. Patient's catheter wasn't draining and so it was replaced at facility. Immediately after, patient began having frank hematuria and 10/10 pain.   130 palp 96 155 10/10 pain

## 2020-11-12 NOTE — ED Notes (Signed)
Patient arrives with blood oozing out of catheter. Catheter attempted to be flushed. Dr. Silverio Lay wanted catheter removed. New 3 way 49F catheter inserted with 97mL balloon size. Bladder manually irrigated with 15mL in, 69mL out. Clots come out every time it is irrigated. Dr. Silverio Lay at bedside. Gave order for continuous bladder irrigation. Every time nurse Huntley Dec stopped irrigating catheter, a clot would form. CBI beginning.

## 2020-11-12 NOTE — Consult Note (Signed)
I have been asked to see the patient by Dr. Chaney Mallingavid Yao for evaluation and management of gross hematuria.  History of present illness: 75 yo man with hx of SCI and NGB managed with indwelling foley who presented to gross hematuria.  Patient had Foley catheter changed at residence and was noted not to have any urine output.  On presentation to the ED Foley catheter was removed and copious clots were then seen.  A 22 French three-way catheter was placed and irrigation attempted.  A noncontrast CT scan of the abdomen pelvis showed moderate amount of blood product taking it most of bladder.  Patient is not tolerating bedside irrigation well.   Review of systems: A 12 point comprehensive review of systems was obtained and is negative unless otherwise stated in the history of present illness.  Patient Active Problem List   Diagnosis Date Noted  . 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  . Hematuria 06/19/2020  . Sacral decubitus ulcer 06/19/2020  . COVID-19 virus infection 06/06/2020 06/19/2020  . Palliative care by specialist   . Goals of care, counseling/discussion   . Protein-calorie malnutrition, severe 05/05/2020  . Chronic indwelling Foley catheter 05/03/2020  . Quadriplegia and quadriparesis (HCC)   . Acute blood loss anemia   . Slow transit constipation   . Essential hypertension   . Benign essential HTN   . Neuropathic pain   . Asthma   . 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    No current facility-administered medications on file prior to encounter.   Current Outpatient Medications on File Prior to Encounter  Medication Sig Dispense Refill  . acetaminophen (TYLENOL) 500 MG tablet Take 1,000 mg by mouth 3 (three) times daily.     Marland Kitchen. albuterol (VENTOLIN HFA) 108 (90 Base) MCG/ACT inhaler Inhale 2 puffs into the lungs daily.    Marland Kitchen. ascorbic acid (VITAMIN C) 500 MG tablet Take 1,000 mg by mouth daily.    Marland Kitchen. aspirin 325 MG tablet Take 325 mg by mouth every  morning.    . docusate sodium (COLACE) 100 MG capsule Take 1 capsule (100 mg total) by mouth 2 (two) times daily. 10 capsule 0  . escitalopram (LEXAPRO) 5 MG tablet Take 2.5 mg by mouth at bedtime.    . feeding supplement (ENSURE ENLIVE / ENSURE PLUS) LIQD Take 237 mLs by mouth 4 (four) times daily. 237 mL 12  . ferrous sulfate 325 (65 FE) MG tablet Take 1 tablet (325 mg total) by mouth 2 (two) times daily with a meal.    . gabapentin (NEURONTIN) 100 MG capsule Take 200 mg by mouth 2 (two) times daily.    . midodrine (PROAMATINE) 10 MG tablet Take 1 tablet (10 mg total) by mouth 2 (two) times daily with a meal.    . Nutritional Supplements (FEEDING SUPPLEMENT, OSMOLITE 1.5 CAL,) LIQD Place 237 mLs into feeding tube 6 (six) times daily.  0  . oxyCODONE (OXY IR/ROXICODONE) 5 MG immediate release tablet Take 0.5 tablets (2.5 mg total) by mouth daily as needed for moderate pain or severe pain. 7 tablet 0  . pantoprazole (PROTONIX) 40 MG tablet Take 1 tablet (40 mg total) by mouth at bedtime.    . polyethylene glycol (MIRALAX / GLYCOLAX) 17 g packet Take 17 g by mouth every morning. Mix in 6 oz water and drink    . scopolamine (TRANSDERM-SCOP) 1 MG/3DAYS Place 1 patch onto the skin every 3 (three) days.    . tamsulosin (FLOMAX) 0.4 MG CAPS capsule Take  1 capsule (0.4 mg total) by mouth daily. 30 capsule 1    Past Medical History:  Diagnosis Date  . Asthma   . 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 Newell Coral, 1992-->surg at [facility].  Summer 2021->Cervicalgia,arm pain, hand atrophy RUE wkness, hyperreflex (Dr. 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 Hay fever   . Hypertension   . Osteoarthritis of both hands     Past Surgical History:  Procedure Laterality Date  . ANTERIOR CERVICAL DECOMP/DISCECTOMY FUSION N/A 04/05/2020   Procedure: ANTERIOR CERVICAL  DECOMPRESSION/DISCECTOMY FUSION, INTERBODY PROSTHESIS, PLATE/SCREWS CERVICAL THREE-CERVICAL FOUR, CERVICAL FOUR- CERVICAL FIVE;  Surgeon: Tressie Stalker, MD;  Location: [facility] OR;  Service: Neurosurgery;  Laterality: N/A;  . ANTERIOR CERVICAL DECOMP/DISCECTOMY FUSION N/A 04/22/2020   Procedure: Reexploration of anterior cervical wound for epidural hematoma;  Surgeon: Donalee Citrin, MD;  Location: [facility] OR;  Service: Neurosurgery;  Laterality: N/A;  . BACK SURGERY    . INCISION AND DRAINAGE Left 2012   L hand infection  . IR GASTROSTOMY TUBE MOD SED  07/05/2020  . ROTATOR CUFF REPAIR Right    x 3  . SPINE SURGERY      Social History   Tobacco Use  . Smoking status: Former Games developer  . Smokeless tobacco: Never Used  Vaping Use  . Vaping Use: Never used  Substance Use Topics  . Alcohol use: Not Currently  . Drug use: Never    Family History  Problem Relation Age of Onset  . Heart attack Mother   . Heart disease Mother   . High blood pressure Mother   . Alcohol abuse Father   . Cancer Father   . Diabetes Father   . High blood pressure Father     PE: Vitals:   11/12/20 2105 11/12/20 2110 11/12/20 2115 11/12/20 2151  BP: 97/78 (!) 101/59 90/64 (!) 124/58  Pulse: (!) 125 (!) 121 (!) 118 (!) 117  Resp: 18 17 17 16   Temp:      TempSrc:      SpO2: 100% 100% 100% 100%   Patient appears to be in no acute distress  patient is alert and oriented x3 Atraumatic normocephalic head No increased work of breathing, no audible wheezes/rhonchi Regular sinus rhythm/rate Abdomen is tender to palpation 22 French three-way Foley catheter difficulty irrigate secondary to patient's pain and clot burden Lower extremities are symmetric without appreciable edema Grossly neurologically intact No identifiable skin lesions  Recent Labs    11/12/20 2000  WBC 7.7  HGB 7.1*  HCT 26.6*   Recent Labs    11/12/20 2000  NA 135  K 3.9  CL 102  CO2 25  GLUCOSE 175*  BUN 35*  CREATININE 0.65   CALCIUM 8.6*   Recent Labs    11/12/20 2000  INR 1.2   No results for input(s): LABURIN in the last 72 hours. Results for orders placed or performed during the hospital encounter of 06/30/20  Blood Culture (routine x 2)     Status: None   Collection Time: 06/30/20 10:05 AM   Specimen: BLOOD  Result Value Ref Range Status   Specimen Description BLOOD RIGHT ANTECUBITAL  Final   Special Requests   Final    BOTTLES DRAWN AEROBIC AND ANAEROBIC Blood Culture adequate volume   Culture   Final    NO GROWTH 5 DAYS Performed at [facility]  Lab, [address], [place], [place] [address]    Report Status 07/05/2020 FINAL  Final  Blood Culture (routine x 2)     Status: None   Collection Time: 06/30/20 10:06 AM   Specimen: BLOOD LEFT FOREARM  Result Value Ref Range Status   Specimen Description BLOOD LEFT FOREARM  Final   Special Requests   Final    BOTTLES DRAWN AEROBIC AND ANAEROBIC Blood Culture adequate volume   Culture   Final    NO GROWTH 5 DAYS Performed at [facility], [address] [place], [place] [address]    Report Status 07/05/2020 FINAL  Final  Urine culture     Status: Abnormal   Collection Time: 06/30/20 10:47 AM   Specimen: In/Out Cath Urine  Result Value Ref Range Status   Specimen Description IN/OUT CATH URINE  Final   Special Requests   Final    NONE Performed at [facility] Lab, [address], [place], [place] [address]    Culture >=100,000 COLONIES/mL ACHROMOBACTER XYLOSOXIDANS (A)  Final   Report Status 07/02/2020 FINAL  Final   Organism ID, Bacteria ACHROMOBACTER XYLOSOXIDANS (A)  Final      Susceptibility   Achromobacter xylosoxidans - MIC*    CEFAZOLIN >=64 RESISTANT Resistant     GENTAMICIN >=16 RESISTANT Resistant     CIPROFLOXACIN >=4 RESISTANT Resistant     IMIPENEM 1 SENSITIVE Sensitive     TRIMETH/SULFA <=20 SENSITIVE Sensitive     * >=100,000 COLONIES/mL ACHROMOBACTER XYLOSOXIDANS  MRSA PCR Screening     Status: None    Collection Time: 07/04/20 12:54 PM   Specimen: Nasal Mucosa; Nasopharyngeal  Result Value Ref Range Status   MRSA by PCR NEGATIVE NEGATIVE Final    Comment:        The GeneXpert MRSA Assay (FDA approved for NASAL specimens only), is one component of a comprehensive MRSA colonization surveillance program. It is not intended to diagnose MRSA infection nor to guide or monitor treatment for MRSA infections. Performed at [facility] Lab, [address], [place], [place] [address]   Resp Panel by RT-PCR (Flu A&B, Covid) Nasopharyngeal Swab     Status: None   Collection Time: 07/11/20  9:35 AM   Specimen: Nasopharyngeal Swab; Nasopharyngeal(NP) swabs in vial transport medium  Result Value Ref Range Status   SARS Coronavirus 2 by RT PCR NEGATIVE NEGATIVE Final    Comment: (NOTE) SARS-CoV-2 target nucleic acids are NOT DETECTED.  The SARS-CoV-2 RNA is generally detectable in upper respiratory specimens during the acute phase of infection. The lowest concentration of SARS-CoV-2 viral copies this assay can detect is 138 copies/mL. A negative result does not preclude SARS-Cov-2 infection and should not be used as the sole basis for treatment or other patient management decisions. A negative result may occur with  improper specimen collection/handling, submission of specimen other than nasopharyngeal swab, presence of viral mutation(s) within the areas targeted by this assay, and inadequate number of viral copies(<138 copies/mL). A negative result must be combined with clinical observations, patient history, and epidemiological information. The expected result is Negative.  Fact Sheet for Patients:  BloggerCourse.com  Fact Sheet for Healthcare Providers:  SeriousBroker.it  This test is no t yet approved or cleared by the [place] FDA and  has been authorized for detection and/or diagnosis of SARS-CoV-2 by FDA under an Emergency  Use Authorization (EUA). This EUA will remain  in effect (meaning this test can be used) for the duration  of the COVID-19 declaration under Section 564(b)(1) of the Act, 21 U.S.C.section 360bbb-3(b)(1), unless the authorization is terminated  or revoked sooner.       Influenza A by PCR NEGATIVE NEGATIVE Final   Influenza B by PCR NEGATIVE NEGATIVE Final    Comment: (NOTE) The Xpert Xpress SARS-CoV-2/FLU/RSV plus assay is intended as an aid in the diagnosis of influenza from Nasopharyngeal swab specimens and should not be used as a sole basis for treatment. Nasal washings and aspirates are unacceptable for Xpert Xpress SARS-CoV-2/FLU/RSV testing.  Fact Sheet for Patients: BloggerCourse.com  Fact Sheet for Healthcare Providers: SeriousBroker.it  This test is not yet approved or cleared by the [place] FDA and has been authorized for detection and/or diagnosis of SARS-CoV-2 by FDA under an Emergency Use Authorization (EUA). This EUA will remain in effect (meaning this test can be used) for the duration of the COVID-19 declaration under Section 564(b)(1) of the Act, 21 U.S.C. section 360bbb-3(b)(1), unless the authorization is terminated or revoked.  Performed at [facility] Lab, [address], [place], [place] [address]     Imaging: CT Abd/Pelvis 11/12/20: IMPRESSION: 1. Moderate amount of acute blood products within the urinary bladder lumen, likely secondary to sequelae associated with the patient's recent Foley catheter replacement. 2. Bilateral subcentimeter nonobstructing renal calculi. 3. Moderate severity bibasilar atelectasis and/or infiltrate. 4. Cholelithiasis. 5. Colonic diverticulosis.   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  Patient had traumatic Foley catheter placement  resulting in gross hematuria with significant clot urinary retention.  -Recommendation from urology for abdominal imaging prior to initiation of continuous bladder irrigation was made however CBI was started in the ED; when patient was examined he was audibly in pain.  CBI was stopped and manual irrigation attempted however difficult secondary to pain and clot burden -Recommend cystoscopy with clot evacuation and possible fulguration in the operating room -Risks and benefits of the procedure were discussed and patient has elected to proceed -Will proceed with urgent surgery   Thank you for involving me in this patient's care. Please page with any further questions or concerns. Massiah Longanecker D Mita Vallo

## 2020-11-12 NOTE — ED Provider Notes (Signed)
[facility]-EMERGENCY DEPT Provider Note   CSN: [id number] Arrival date & time: 11/12/20  [id number]     History Chief Complaint  Patient presents with  . Hematuria  . Catheter Issue    Shannon [name] is a 75 y.o. male hx of asthma, HTN, here presenting with hematuria.  Patient has a indwelling catheter for paraplegia.  Patient's catheter was clotted yesterday.  A new Foley was placed by nursing this morning and large clots were coming out around the catheter.  Patient was in severe pain.  He was noted to be tachycardic and diaphoretic.  The history is provided by the EMS personnel.       Past Medical History:  Diagnosis Date  . Asthma   . 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 Newell CoralNudelman, 1992-->[facility].  Summer 2021->Cervicalgia,arm pain, hand atrophy RUE wkness, hyperreflex (Dr. Sharyn CreamerJenkins)-cerv MRI: extensive cord atrophy and spinal and foraminal stenosis->to get surgery 03/2020  . Hay fever   . Hypertension   . Osteoarthritis of both hands     Patient Active Problem List   Diagnosis Date Noted  . 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  . Hematuria 06/19/2020  . Sacral decubitus ulcer 06/19/2020  . COVID-19 virus infection 06/06/2020 06/19/2020  . Palliative care by specialist   . Goals of care, counseling/discussion   . Protein-calorie malnutrition, severe 05/05/2020  . Chronic indwelling Foley catheter 05/03/2020  . Quadriplegia and quadriparesis (HCC)   . Acute blood loss anemia   . Slow transit constipation   . Essential hypertension   . Benign essential HTN   . Neuropathic pain   . Asthma   . 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 ANTERIOR CERVICAL DECOMP/DISCECTOMY FUSION N/A 04/05/2020   Procedure: ANTERIOR CERVICAL DECOMPRESSION/DISCECTOMY FUSION, INTERBODY PROSTHESIS,  PLATE/SCREWS CERVICAL THREE-CERVICAL FOUR, CERVICAL FOUR- CERVICAL FIVE;  Surgeon: Tressie StalkerJenkins, Jeffrey, MD;  [facility];  Service: Neurosurgery;  Laterality: N/A;  . ANTERIOR CERVICAL DECOMP/DISCECTOMY FUSION N/A 04/22/2020   Procedure: Reexploration of anterior cervical wound for epidural hematoma;  Surgeon: Donalee Citrinram, Gary, MD;  [facility];  Service: Neurosurgery;  Laterality: N/A;  . BACK SURGERY    . INCISION AND DRAINAGE Left 2012   L hand infection  . IR GASTROSTOMY TUBE MOD SED  07/05/2020  . ROTATOR CUFF REPAIR Right    x 3  . SPINE SURGERY         Family History  Problem Relation Age of Onset  . Heart attack Mother   . Heart disease Mother   . High blood pressure Mother   . Alcohol abuse Father   . Cancer Father   . Diabetes Father   . High blood pressure Father     Social History   Tobacco Use  . Smoking status: Former Games developermoker  . Smokeless tobacco: Never Used  Vaping Use  . Vaping Use: Never used  Substance Use Topics  . Alcohol use: Not Currently  . Drug use: Never    Home Medications Prior to Admission medications   Medication Sig Start Date End Date Taking? Authorizing Provider  albuterol (VENTOLIN HFA) 108 (90 Base) MCG/ACT inhaler Inhale 2 puffs into the lungs daily.   Yes [provider]  AMINO ACIDS-PROTEIN HYDROLYS PO Take by mouth 2 (two) times daily between meals. 17-100 gram/ Kcal/30 mL   (Also known as  Pro -Stat)   Yes [provider]  ascorbic acid (VITAMIN C) 500 MG tablet Take 1,000 mg by mouth daily.   Yes [provider]  aspirin 81 MG chewable tablet Chew 81 mg by mouth every morning.   Yes [provider]  Cranberry 400 MG CAPS Take 1 capsule by mouth daily at 6 (six) AM.   Yes [provider]  docusate (COLACE) 50 MG/5ML liquid Place 100 mg into feeding tube 2 (two) times daily.   Yes [provider]  DULoxetine (CYMBALTA) 30 MG capsule Take 30 mg by mouth daily.   Yes [provider]  Ferrous Sulfate 220 (44 Fe) MG/5ML LIQD Give 5 mLs by tube in the morning, at noon, and at bedtime. Via G tube   Yes [provider]  Lactobacillus (PROBIOTIC ACIDOPHILUS) CAPS Place 1 capsule into feeding tube daily.   Yes [provider]  midodrine (PROAMATINE) 10 MG tablet Take 1 tablet (10 mg total) by mouth 2 (two) times daily with a meal. 06/06/20  Yes Zannie Cove, MD  Multiple Vitamins-Minerals (ZINC PO) Place 220 mg into feeding tube daily.   Yes [provider]  omeprazole (PRILOSEC) 20 MG capsule Take 20 mg by mouth every other day. Per Tube   Yes [provider]  oxyCODONE (OXY IR/ROXICODONE) 5 MG immediate release tablet Take 0.5 tablets (2.5 mg total) by mouth daily as needed for moderate pain or severe pain. Patient taking differently: Take 2.5 mg by mouth 2 (two) times daily as needed for moderate pain or severe pain. 07/11/20  Yes Lurene Shadow, MD  oxyCODONE-acetaminophen (PERCOCET/ROXICET) 5-325 MG tablet Take 1 tablet by mouth 2 (two) times daily.   Yes [provider]  polyethylene glycol (MIRALAX / GLYCOLAX) 17 g packet Place 17 g into feeding tube daily.   Yes [provider]  scopolamine (TRANSDERM-SCOP) 1 MG/3DAYS Place 1 patch onto the skin every 3 (three) days.   Yes [provider]  tamsulosin (FLOMAX) 0.4 MG CAPS capsule Take 1 capsule (0.4 mg total) by mouth daily. 04/28/20  Yes Tressie Stalker, MD  acetaminophen (TYLENOL) 500 MG tablet Take 1,000 mg by mouth in the morning and at bedtime.    [provider]  ertapenem (INVANZ) IVPB Inject 1 g into the vein. Daily for 6 weeks Per MAR until 11/08/20    [provider]  pregabalin (LYRICA) 100 MG capsule Take 100 mg by mouth 2 (two) times daily.    [provider]  vancomycin IVPB Inject 750 mg into the vein every 12 (twelve) hours.    [provider]    Allergies    Iodine and Penicillins  Review of  Systems   Review of Systems  Genitourinary: Positive for hematuria.  All other systems reviewed and are negative.   Physical Exam Updated Vital Signs BP 137/90 (BP Location: Right Arm)   Pulse (!) 122   Temp 97.6 F (36.4 C) (Oral)   Resp 16   SpO2 100%   Physical Exam Vitals and nursing note reviewed.  Constitutional:      Comments: Patient is crying in pain  HENT:     Head: Normocephalic.     Nose: Nose normal.     Mouth/Throat:     Mouth: Mucous membranes are moist.  Eyes:     Extraocular Movements: Extraocular movements intact.     Pupils: Pupils are equal, round, and reactive to light.  Cardiovascular:     Rate and Rhythm:  Regular rhythm. Tachycardia present.     Pulses: Normal pulses.     Heart sounds: Normal heart sounds.  Pulmonary:     Effort: Pulmonary effort is normal.     Breath sounds: Normal breath sounds.  Abdominal:     Comments: + Suprapubic tenderness.  Musculoskeletal:        General: Normal range of motion.     Cervical back: Normal range of motion and neck supple.  Skin:    General: Skin is warm.     Capillary Refill: Capillary refill takes less than 2 seconds.  Neurological:     General: No focal deficit present.     Mental Status: He is alert.     Comments: Patient is paraplegic which is baseline  Psychiatric:        Mood and Affect: Mood normal.        Behavior: Behavior normal.     ED Results / Procedures / Treatments   Labs (all labs ordered are listed, but only abnormal results are displayed) Labs Reviewed  CBC WITH DIFFERENTIAL/PLATELET - Abnormal; Notable for the following components:      Result Value   RBC 3.30 (*)    Hemoglobin 7.1 (*)    HCT 26.6 (*)    MCH 21.5 (*)    MCHC 26.7 (*)    RDW 17.2 (*)    Lymphs Abs 0.6 (*)    All other components within normal limits  COMPREHENSIVE METABOLIC PANEL - Abnormal; Notable for the following components:   Glucose, Bld 175 (*)    BUN 35 (*)    Calcium 8.6 (*)    Albumin 2.1  (*)    All other components within normal limits  PROTIME-INR - Abnormal; Notable for the following components:   Prothrombin Time 15.6 (*)    All other components within normal limits  URINALYSIS, ROUTINE W REFLEX MICROSCOPIC - Abnormal; Notable for the following components:   Color, Urine RED (*)    APPearance TURBID (*)    Glucose, UA   (*)    Value: TEST NOT REPORTED DUE TO COLOR INTERFERENCE OF URINE PIGMENT   Hgb urine dipstick   (*)    Value: TEST NOT REPORTED DUE TO COLOR INTERFERENCE OF URINE PIGMENT   Bilirubin Urine   (*)    Value: TEST NOT REPORTED DUE TO COLOR INTERFERENCE OF URINE PIGMENT   Ketones, ur   (*)    Value: TEST NOT REPORTED DUE TO COLOR INTERFERENCE OF URINE PIGMENT   Protein, ur   (*)    Value: TEST NOT REPORTED DUE TO COLOR INTERFERENCE OF URINE PIGMENT   Nitrite   (*)    Value: TEST NOT REPORTED DUE TO COLOR INTERFERENCE OF URINE PIGMENT   Leukocytes,Ua   (*)    Value: TEST NOT REPORTED DUE TO COLOR INTERFERENCE OF URINE PIGMENT   All other components within normal limits  URINE CULTURE  RESP PANEL BY RT-PCR (FLU A&B, COVID) ARPGX2  URINALYSIS, MICROSCOPIC (REFLEX)  TYPE AND SCREEN  PREPARE RBC (CROSSMATCH)    EKG EKG Interpretation  Date/Time:  Sunday Nov 12 2020 19:57:20 EDT Ventricular Rate:  160 PR Interval:  152 QRS Duration: 152 QT Interval:  317 QTC Calculation: 518 R Axis:   122 Text Interpretation: Poor quality data, interpretation may be affected Extreme tachycardia with wide complex, no further rhythm analysis attempted Artifact in lead(s) II III aVR aVL aVF V1 V2 V3 V5 V6 and baseline wander in lead(s) V5 Since last  tracing rate faster Confirmed by Richardean Canal ([id number]) on 11/12/2020 8:10:55 PM   Radiology CT Renal Stone Study  Result Date: 11/12/2020 CLINICAL DATA:  Hematuria following Foley catheter replacement. EXAM: CT ABDOMEN AND PELVIS WITHOUT CONTRAST TECHNIQUE: Multidetector CT imaging of the abdomen and pelvis was performed  following the standard protocol without IV contrast. COMPARISON:  June 19, 2020 FINDINGS: Lower chest: There is elevation of the right hemidiaphragm with moderate severity atelectasis and/or infiltrate seen within the posterior aspect of the bilateral lung bases. A small left pleural effusion is seen. Hepatobiliary: No focal liver abnormality is seen. A 1.0 cm gallstone is seen within the lumen of an otherwise normal-appearing gallbladder. Pancreas: Unremarkable. No pancreatic ductal dilatation or surrounding inflammatory changes. Spleen: Normal in size without focal abnormality. Adrenals/Urinary Tract: Adrenal glands are unremarkable. Kidneys are normal in size, bilaterally. A 4 mm nonobstructing renal stone is seen within the lateral aspect of the mid right kidney. 3 mm nonobstructing renal stones are seen within the posterior aspect of the mid left kidney. There is mild to moderate severity bilateral hydronephrosis and hydroureter, without evidence of obstructing renal stones. An 8 mm calcification is seen within the posterolateral wall of the urinary bladder on the left (axial CT image 84, CT series number 2). This is adjacent to the left UVJ. A Foley catheter, a moderate amount of mildly increased attenuation (approximately 68.67 Hounsfield units) and a mild amount of air are seen within the bladder lumen. Stomach/Bowel: A percutaneous gastrostomy tube is in place. Its distal tip and insufflator bulb are seen within the body of the stomach. Appendix appears normal. No evidence of bowel dilatation. Noninflamed diverticula are seen within the large bowel. Vascular/Lymphatic: Aortic atherosclerosis. No enlarged abdominal or pelvic lymph nodes. Reproductive: There is mild to moderate severity prostate gland enlargement. Other: Mild to moderate severity anasarca is seen throughout the abdominal and pelvic walls. No abdominopelvic ascites. Musculoskeletal: Degenerative changes seen throughout the lumbar spine.  IMPRESSION: 1. Moderate amount of acute blood products within the urinary bladder lumen, likely secondary to sequelae associated with the patient's recent Foley catheter replacement. 2. Bilateral subcentimeter nonobstructing renal calculi. 3. Moderate severity bibasilar atelectasis and/or infiltrate. 4. Cholelithiasis. 5. Colonic diverticulosis. Electronically Signed   By: Aram Candela M.D.   On: 11/12/2020 23:29    Procedures Procedures   CRITICAL CARE Performed by: Richardean Canal   Total critical care time: 30 minutes  Critical care time was exclusive of separately billable procedures and treating other patients.  Critical care was necessary to treat or prevent imminent or life-threatening deterioration.  Critical care was time spent personally by me on the following activities: development of treatment plan with patient and/or surrogate as well as nursing, discussions with consultants, evaluation of patient's response to treatment, examination of patient, obtaining history from patient or surrogate, ordering and performing treatments and interventions, ordering and review of laboratory studies, ordering and review of radiographic studies, pulse oximetry and re-evaluation of patient's condition.   Medications Ordered in ED Medications  sodium chloride irrigation 0.9 % 3,000 mL (3,000 mLs Irrigation New Bag/Given 11/12/20 2122)  0.9 %  sodium chloride infusion (has no administration in time range)  sodium chloride 0.9 % bolus 1,000 mL (0 mLs Intravenous Stopped 11/12/20 2236)  lidocaine (XYLOCAINE) 2 % jelly 1 application (1 application Urethral Given 11/12/20 2030)  fentaNYL (SUBLIMAZE) injection 50 mcg (50 mcg Intravenous Given 11/12/20 2312)  0.9 %  sodium chloride infusion ( Intravenous New Bag/Given 11/12/20 2313)  ED Course  I have reviewed the triage vital signs and the nursing notes.  Pertinent labs & imaging results that were available during my care of the patient were  reviewed by me and considered in my medical decision making (see chart for details).    MDM Rules/Calculators/A&P                         Shannon [name] is a 75 y.o. male here presenting with hematuria.  I did a bedside ultrasound there are large clots in the bladder.  However the Foley balloon does not seem to be in the bladder.  Nursing was able to remove the Foley catheter.  Nursing also put in a three-way 22 French catheter.  Plan to irrigate the catheter and get blood work and hydrate patient.  Patient may need to be started on CBI.   9 pm Nurse was able to place Foley catheter and about 500 cc a large clot came out.  Irrigated catheter with 3 L normal saline.  However clots continue to come back.  I discussed case with Dr. Arita Miss.  She request CT renal stone.   10 pm Nursing had to irrigate the catheter every 5 minutes and large clots still coming out.  CT showed large amount of blood in the bladder.  CBI was started by nursing.   11:50 PM Dr. Arita Miss came to see the patient. She reviewed the imaging and plans to take patient to the OR.  She stopped the CBI for now.  Hospitalist to admit.   Final Clinical Impression(s) / ED Diagnoses Final diagnoses:  None    Rx / DC Orders ED Discharge Orders    None       Charlynne Pander, MD 11/12/20 2351

## 2020-11-12 NOTE — H&P (View-Only) (Signed)
I have been asked to see the patient by Dr. David Yao for evaluation and management of gross hematuria.  History of present illness: 74 yo man with hx of SCI and NGB managed with indwelling foley who presented to gross hematuria.  Patient had Foley catheter changed at residence and was noted not to have any urine output.  On presentation to the ED Foley catheter was removed and copious clots were then seen.  A 22 French three-way catheter was placed and irrigation attempted.  A noncontrast CT scan of the abdomen pelvis showed moderate amount of blood product taking it most of bladder.  Patient is not tolerating bedside irrigation well.   Review of systems: A 12 point comprehensive review of systems was obtained and is negative unless otherwise stated in the history of present illness.  Patient Active Problem List   Diagnosis Date Noted  . 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  . Hematuria 06/19/2020  . Sacral decubitus ulcer 06/19/2020  . COVID-19 virus infection 06/06/2020 06/19/2020  . Palliative care by specialist   . Goals of care, counseling/discussion   . Protein-calorie malnutrition, severe 05/05/2020  . Chronic indwelling Foley catheter 05/03/2020  . Quadriplegia and quadriparesis (HCC)   . Acute blood loss anemia   . Slow transit constipation   . Essential hypertension   . Benign essential HTN   . Neuropathic pain   . Asthma   . 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    No current facility-administered medications on file prior to encounter.   Current Outpatient Medications on File Prior to Encounter  Medication Sig Dispense Refill  . acetaminophen (TYLENOL) 500 MG tablet Take 1,000 mg by mouth 3 (three) times daily.     . albuterol (VENTOLIN HFA) 108 (90 Base) MCG/ACT inhaler Inhale 2 puffs into the lungs daily.    . ascorbic acid (VITAMIN C) 500 MG tablet Take 1,000 mg by mouth daily.    . aspirin 325 MG tablet Take 325 mg by mouth every  morning.    . docusate sodium (COLACE) 100 MG capsule Take 1 capsule (100 mg total) by mouth 2 (two) times daily. 10 capsule 0  . escitalopram (LEXAPRO) 5 MG tablet Take 2.5 mg by mouth at bedtime.    . feeding supplement (ENSURE ENLIVE / ENSURE PLUS) LIQD Take 237 mLs by mouth 4 (four) times daily. 237 mL 12  . ferrous sulfate 325 (65 FE) MG tablet Take 1 tablet (325 mg total) by mouth 2 (two) times daily with a meal.    . gabapentin (NEURONTIN) 100 MG capsule Take 200 mg by mouth 2 (two) times daily.    . midodrine (PROAMATINE) 10 MG tablet Take 1 tablet (10 mg total) by mouth 2 (two) times daily with a meal.    . Nutritional Supplements (FEEDING SUPPLEMENT, OSMOLITE 1.5 CAL,) LIQD Place 237 mLs into feeding tube 6 (six) times daily.  0  . oxyCODONE (OXY IR/ROXICODONE) 5 MG immediate release tablet Take 0.5 tablets (2.5 mg total) by mouth daily as needed for moderate pain or severe pain. 7 tablet 0  . pantoprazole (PROTONIX) 40 MG tablet Take 1 tablet (40 mg total) by mouth at bedtime.    . polyethylene glycol (MIRALAX / GLYCOLAX) 17 g packet Take 17 g by mouth every morning. Mix in 6 oz water and drink    . scopolamine (TRANSDERM-SCOP) 1 MG/3DAYS Place 1 patch onto the skin every 3 (three) days.    . tamsulosin (FLOMAX) 0.4 MG CAPS capsule Take   1 capsule (0.4 mg total) by mouth daily. 30 capsule 1    Past Medical History:  Diagnosis Date  . Asthma   . 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 Newell Coral, 1992-->surg at [facility].  Summer 2021->Cervicalgia,arm pain, hand atrophy RUE wkness, hyperreflex (Dr. 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 Hay fever   . Hypertension   . Osteoarthritis of both hands     Past Surgical History:  Procedure Laterality Date  . ANTERIOR CERVICAL DECOMP/DISCECTOMY FUSION N/A 04/05/2020   Procedure: ANTERIOR CERVICAL  DECOMPRESSION/DISCECTOMY FUSION, INTERBODY PROSTHESIS, PLATE/SCREWS CERVICAL THREE-CERVICAL FOUR, CERVICAL FOUR- CERVICAL FIVE;  Surgeon: Tressie Stalker, MD;  Location: [facility] OR;  Service: Neurosurgery;  Laterality: N/A;  . ANTERIOR CERVICAL DECOMP/DISCECTOMY FUSION N/A 04/22/2020   Procedure: Reexploration of anterior cervical wound for epidural hematoma;  Surgeon: Donalee Citrin, MD;  Location: [facility] OR;  Service: Neurosurgery;  Laterality: N/A;  . BACK SURGERY    . INCISION AND DRAINAGE Left 2012   L hand infection  . IR GASTROSTOMY TUBE MOD SED  07/05/2020  . ROTATOR CUFF REPAIR Right    x 3  . SPINE SURGERY      Social History   Tobacco Use  . Smoking status: Former Games developer  . Smokeless tobacco: Never Used  Vaping Use  . Vaping Use: Never used  Substance Use Topics  . Alcohol use: Not Currently  . Drug use: Never    Family History  Problem Relation Age of Onset  . Heart attack Mother   . Heart disease Mother   . High blood pressure Mother   . Alcohol abuse Father   . Cancer Father   . Diabetes Father   . High blood pressure Father     PE: Vitals:   11/12/20 2105 11/12/20 2110 11/12/20 2115 11/12/20 2151  BP: 97/78 (!) 101/59 90/64 (!) 124/58  Pulse: (!) 125 (!) 121 (!) 118 (!) 117  Resp: 18 17 17 16   Temp:      TempSrc:      SpO2: 100% 100% 100% 100%   Patient appears to be in no acute distress  patient is alert and oriented x3 Atraumatic normocephalic head No increased work of breathing, no audible wheezes/rhonchi Regular sinus rhythm/rate Abdomen is tender to palpation 22 French three-way Foley catheter difficulty irrigate secondary to patient's pain and clot burden Lower extremities are symmetric without appreciable edema Grossly neurologically intact No identifiable skin lesions  Recent Labs    11/12/20 2000  WBC 7.7  HGB 7.1*  HCT 26.6*   Recent Labs    11/12/20 2000  NA 135  K 3.9  CL 102  CO2 25  GLUCOSE 175*  BUN 35*  CREATININE 0.65   CALCIUM 8.6*   Recent Labs    11/12/20 2000  INR 1.2   No results for input(s): LABURIN in the last 72 hours. Results for orders placed or performed during the hospital encounter of 06/30/20  Blood Culture (routine x 2)     Status: None   Collection Time: 06/30/20 10:05 AM   Specimen: BLOOD  Result Value Ref Range Status   Specimen Description BLOOD RIGHT ANTECUBITAL  Final   Special Requests   Final    BOTTLES DRAWN AEROBIC AND ANAEROBIC Blood Culture adequate volume   Culture   Final    NO GROWTH 5 DAYS Performed at [facility]  Lab, [address], [place], [place] [address]    Report Status 07/05/2020 FINAL  Final  Blood Culture (routine x 2)     Status: None   Collection Time: 06/30/20 10:06 AM   Specimen: BLOOD LEFT FOREARM  Result Value Ref Range Status   Specimen Description BLOOD LEFT FOREARM  Final   Special Requests   Final    BOTTLES DRAWN AEROBIC AND ANAEROBIC Blood Culture adequate volume   Culture   Final    NO GROWTH 5 DAYS Performed at [facility], [address] [place], [place] [address]    Report Status 07/05/2020 FINAL  Final  Urine culture     Status: Abnormal   Collection Time: 06/30/20 10:47 AM   Specimen: In/Out Cath Urine  Result Value Ref Range Status   Specimen Description IN/OUT CATH URINE  Final   Special Requests   Final    NONE Performed at [facility] Lab, [address], [place], [place] [address]    Culture >=100,000 COLONIES/mL ACHROMOBACTER XYLOSOXIDANS (A)  Final   Report Status 07/02/2020 FINAL  Final   Organism ID, Bacteria ACHROMOBACTER XYLOSOXIDANS (A)  Final      Susceptibility   Achromobacter xylosoxidans - MIC*    CEFAZOLIN >=64 RESISTANT Resistant     GENTAMICIN >=16 RESISTANT Resistant     CIPROFLOXACIN >=4 RESISTANT Resistant     IMIPENEM 1 SENSITIVE Sensitive     TRIMETH/SULFA <=20 SENSITIVE Sensitive     * >=100,000 COLONIES/mL ACHROMOBACTER XYLOSOXIDANS  MRSA PCR Screening     Status: None    Collection Time: 07/04/20 12:54 PM   Specimen: Nasal Mucosa; Nasopharyngeal  Result Value Ref Range Status   MRSA by PCR NEGATIVE NEGATIVE Final    Comment:        The GeneXpert MRSA Assay (FDA approved for NASAL specimens only), is one component of a comprehensive MRSA colonization surveillance program. It is not intended to diagnose MRSA infection nor to guide or monitor treatment for MRSA infections. Performed at [facility] Lab, [address], [place], [place] [address]   Resp Panel by RT-PCR (Flu A&B, Covid) Nasopharyngeal Swab     Status: None   Collection Time: 07/11/20  9:35 AM   Specimen: Nasopharyngeal Swab; Nasopharyngeal(NP) swabs in vial transport medium  Result Value Ref Range Status   SARS Coronavirus 2 by RT PCR NEGATIVE NEGATIVE Final    Comment: (NOTE) SARS-CoV-2 target nucleic acids are NOT DETECTED.  The SARS-CoV-2 RNA is generally detectable in upper respiratory specimens during the acute phase of infection. The lowest concentration of SARS-CoV-2 viral copies this assay can detect is 138 copies/mL. A negative result does not preclude SARS-Cov-2 infection and should not be used as the sole basis for treatment or other patient management decisions. A negative result may occur with  improper specimen collection/handling, submission of specimen other than nasopharyngeal swab, presence of viral mutation(s) within the areas targeted by this assay, and inadequate number of viral copies(<138 copies/mL). A negative result must be combined with clinical observations, patient history, and epidemiological information. The expected result is Negative.  Fact Sheet for Patients:  BloggerCourse.com  Fact Sheet for Healthcare Providers:  SeriousBroker.it  This test is no t yet approved or cleared by the [place] FDA and  has been authorized for detection and/or diagnosis of SARS-CoV-2 by FDA under an Emergency  Use Authorization (EUA). This EUA will remain  in effect (meaning this test can be used) for the duration  of the COVID-19 declaration under Section 564(b)(1) of the Act, 21 U.S.C.section 360bbb-3(b)(1), unless the authorization is terminated  or revoked sooner.       Influenza A by PCR NEGATIVE NEGATIVE Final   Influenza B by PCR NEGATIVE NEGATIVE Final    Comment: (NOTE) The Xpert Xpress SARS-CoV-2/FLU/RSV plus assay is intended as an aid in the diagnosis of influenza from Nasopharyngeal swab specimens and should not be used as a sole basis for treatment. Nasal washings and aspirates are unacceptable for Xpert Xpress SARS-CoV-2/FLU/RSV testing.  Fact Sheet for Patients: BloggerCourse.com  Fact Sheet for Healthcare Providers: SeriousBroker.it  This test is not yet approved or cleared by the [place] FDA and has been authorized for detection and/or diagnosis of SARS-CoV-2 by FDA under an Emergency Use Authorization (EUA). This EUA will remain in effect (meaning this test can be used) for the duration of the COVID-19 declaration under Section 564(b)(1) of the Act, 21 U.S.C. section 360bbb-3(b)(1), unless the authorization is terminated or revoked.  Performed at [facility] Lab, [address], [place], [place] [address]     Imaging: CT Abd/Pelvis 11/12/20: IMPRESSION: 1. Moderate amount of acute blood products within the urinary bladder lumen, likely secondary to sequelae associated with the patient's recent Foley catheter replacement. 2. Bilateral subcentimeter nonobstructing renal calculi. 3. Moderate severity bibasilar atelectasis and/or infiltrate. 4. Cholelithiasis. 5. Colonic diverticulosis.   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  Patient had traumatic Foley catheter placement  resulting in gross hematuria with significant clot urinary retention.  -Recommendation from urology for abdominal imaging prior to initiation of continuous bladder irrigation was made however CBI was started in the ED; when patient was examined he was audibly in pain.  CBI was stopped and manual irrigation attempted however difficult secondary to pain and clot burden -Recommend cystoscopy with clot evacuation and possible fulguration in the operating room -Risks and benefits of the procedure were discussed and patient has elected to proceed -Will proceed with urgent surgery   Thank you for involving me in this patient's care. Please page with any further questions or concerns. Wen Merced D Merideth Bosque

## 2020-11-13 ENCOUNTER — Other Ambulatory Visit: Payer: Self-pay

## 2020-11-13 ENCOUNTER — Inpatient Hospital Stay (HOSPITAL_COMMUNITY): Admitting: Certified Registered Nurse Anesthetist

## 2020-11-13 ENCOUNTER — Encounter (HOSPITAL_COMMUNITY): Payer: Self-pay | Admitting: Internal Medicine

## 2020-11-13 ENCOUNTER — Encounter (HOSPITAL_COMMUNITY)
Admission: EM | Disposition: A | Discharge status: Skilled Nursing Facility | Payer: Self-pay | Source: Skilled Nursing Facility | Attending: Internal Medicine

## 2020-11-13 ENCOUNTER — Inpatient Hospital Stay (HOSPITAL_COMMUNITY)

## 2020-11-13 DIAGNOSIS — I9589 Other hypotension: Secondary | ICD-10-CM

## 2020-11-13 DIAGNOSIS — Z978 Presence of other specified devices: Secondary | ICD-10-CM

## 2020-11-13 DIAGNOSIS — E43 Unspecified severe protein-calorie malnutrition: Secondary | ICD-10-CM | POA: Diagnosis not present

## 2020-11-13 DIAGNOSIS — I10 Essential (primary) hypertension: Secondary | ICD-10-CM

## 2020-11-13 DIAGNOSIS — R578 Other shock: Secondary | ICD-10-CM

## 2020-11-13 DIAGNOSIS — R310 Gross hematuria: Secondary | ICD-10-CM | POA: Diagnosis not present

## 2020-11-13 DIAGNOSIS — G8250 Quadriplegia, unspecified: Secondary | ICD-10-CM

## 2020-11-13 DIAGNOSIS — D62 Acute posthemorrhagic anemia: Secondary | ICD-10-CM | POA: Diagnosis not present

## 2020-11-13 DIAGNOSIS — L89154 Pressure ulcer of sacral region, stage 4: Secondary | ICD-10-CM | POA: Diagnosis present

## 2020-11-13 DIAGNOSIS — D649 Anemia, unspecified: Secondary | ICD-10-CM | POA: Diagnosis present

## 2020-11-13 HISTORY — PX: CYSTOSCOPY: SHX5120

## 2020-11-13 LAB — CBC WITH DIFFERENTIAL/PLATELET
Abs Immature Granulocytes: 0.07 10*3/uL (ref 0.00–0.07)
Basophils Absolute: 0 10*3/uL (ref 0.0–0.1)
Basophils Relative: 0 %
Eosinophils Absolute: 0 10*3/uL (ref 0.0–0.5)
Eosinophils Relative: 0 %
HCT: 17.7 % — ABNORMAL LOW (ref 39.0–52.0)
Hemoglobin: 5.5 g/dL — CL (ref 13.0–17.0)
Immature Granulocytes: 1 %
Lymphocytes Relative: 15 %
Lymphs Abs: 1.7 10*3/uL (ref 0.7–4.0)
MCH: 25.3 pg — ABNORMAL LOW (ref 26.0–34.0)
MCHC: 31.1 g/dL (ref 30.0–36.0)
MCV: 81.6 fL (ref 80.0–100.0)
Monocytes Absolute: 1.8 10*3/uL — ABNORMAL HIGH (ref 0.1–1.0)
Monocytes Relative: 16 %
Neutro Abs: 7.7 10*3/uL (ref 1.7–7.7)
Neutrophils Relative %: 68 %
Platelets: 164 10*3/uL (ref 150–400)
RBC: 2.17 MIL/uL — ABNORMAL LOW (ref 4.22–5.81)
RDW: 16.3 % — ABNORMAL HIGH (ref 11.5–15.5)
WBC: 11.4 10*3/uL — ABNORMAL HIGH (ref 4.0–10.5)
nRBC: 0.2 % (ref 0.0–0.2)

## 2020-11-13 LAB — BASIC METABOLIC PANEL
Anion gap: 3 — ABNORMAL LOW (ref 5–15)
BUN: 29 mg/dL — ABNORMAL HIGH (ref 8–23)
CO2: 26 mmol/L (ref 22–32)
Calcium: 7.9 mg/dL — ABNORMAL LOW (ref 8.9–10.3)
Chloride: 107 mmol/L (ref 98–111)
Creatinine, Ser: 0.57 mg/dL — ABNORMAL LOW (ref 0.61–1.24)
GFR, Estimated: >60 mL/min (ref >60)
Glucose, Bld: 144 mg/dL — ABNORMAL HIGH (ref 70–99)
Potassium: 3.8 mmol/L (ref 3.5–5.1)
Sodium: 136 mmol/L (ref 135–145)

## 2020-11-13 LAB — HEMOGLOBIN AND HEMATOCRIT, BLOOD
HCT: 19 % — ABNORMAL LOW (ref 39.0–52.0)
HCT: 22.3 % — ABNORMAL LOW (ref 39.0–52.0)
Hemoglobin: 5.9 g/dL — CL (ref 13.0–17.0)
Hemoglobin: 7.2 g/dL — ABNORMAL LOW (ref 13.0–17.0)

## 2020-11-13 LAB — ECHOCARDIOGRAM COMPLETE
Area-P 1/2: 3.91 cm2
Calc EF: 57.6 %
Height: 67 in
S' Lateral: 2.59 cm
Single Plane A2C EF: 59.3 %
Single Plane A4C EF: 54.7 %
Weight: 3153.46 oz

## 2020-11-13 LAB — RESP PANEL BY RT-PCR (FLU A&B, COVID) ARPGX2
Influenza A by PCR: NEGATIVE
Influenza B by PCR: NEGATIVE
SARS Coronavirus 2 by RT PCR: NEGATIVE

## 2020-11-13 LAB — MRSA PCR SCREENING: MRSA by PCR: POSITIVE — AB

## 2020-11-13 LAB — PREPARE RBC (CROSSMATCH)

## 2020-11-13 SURGERY — CYSTOSCOPY
Anesthesia: General

## 2020-11-13 MED ORDER — PANTOPRAZOLE SODIUM 40 MG IV SOLR
40.0000 mg | INTRAVENOUS | Status: DC
  Administered 2020-11-13: 40 mg via INTRAVENOUS
  Filled 2020-11-13: qty 40

## 2020-11-13 MED ORDER — PROPOFOL 10 MG/ML IV BOLUS
INTRAVENOUS | Status: AC
  Filled 2020-11-13: qty 20

## 2020-11-13 MED ORDER — DAKINS (1/4 STRENGTH) 0.125 % EX SOLN
Freq: Every day | CUTANEOUS | Status: AC
  Filled 2020-11-13: qty 473

## 2020-11-13 MED ORDER — TAMSULOSIN HCL 0.4 MG PO CAPS
0.4000 mg | ORAL_CAPSULE | Freq: Every day | ORAL | Status: DC
  Administered 2020-11-13 – 2020-11-18 (×6): 0.4 mg via ORAL
  Filled 2020-11-13 (×6): qty 1

## 2020-11-13 MED ORDER — LIDOCAINE 2% (20 MG/ML) 5 ML SYRINGE
INTRAMUSCULAR | Status: DC | PRN
  Administered 2020-11-13: 60 mg via INTRAVENOUS

## 2020-11-13 MED ORDER — PHENYLEPHRINE HCL-NACL 10-0.9 MG/250ML-% IV SOLN
INTRAVENOUS | Status: DC | PRN
  Administered 2020-11-13: 60 ug/min via INTRAVENOUS

## 2020-11-13 MED ORDER — DOCUSATE SODIUM 50 MG/5ML PO LIQD
100.0000 mg | Freq: Two times a day (BID) | ORAL | Status: DC
  Administered 2020-11-13 – 2020-11-18 (×10): 100 mg
  Filled 2020-11-13 (×11): qty 10

## 2020-11-13 MED ORDER — FENTANYL CITRATE (PF) 100 MCG/2ML IJ SOLN
INTRAMUSCULAR | Status: AC
  Filled 2020-11-13: qty 2

## 2020-11-13 MED ORDER — CEFAZOLIN SODIUM-DEXTROSE 2-4 GM/100ML-% IV SOLN
INTRAVENOUS | Status: AC
  Filled 2020-11-13: qty 100

## 2020-11-13 MED ORDER — VASOPRESSIN 20 UNIT/ML IV SOLN
INTRAVENOUS | Status: AC
  Filled 2020-11-13: qty 1

## 2020-11-13 MED ORDER — PROSOURCE TF PO LIQD
45.0000 mL | Freq: Two times a day (BID) | ORAL | Status: DC
  Administered 2020-11-13 – 2020-11-18 (×11): 45 mL
  Filled 2020-11-13 (×10): qty 45

## 2020-11-13 MED ORDER — PANTOPRAZOLE SODIUM 40 MG PO PACK
40.0000 mg | PACK | Freq: Every day | ORAL | Status: DC
  Administered 2020-11-13 – 2020-11-18 (×6): 40 mg
  Filled 2020-11-13 (×6): qty 20

## 2020-11-13 MED ORDER — SODIUM CHLORIDE 0.9% IV SOLUTION: Freq: Once | INTRAVENOUS | Status: AC

## 2020-11-13 MED ORDER — SODIUM CHLORIDE 0.9 % IR SOLN
Status: DC | PRN
  Administered 2020-11-13 (×2): 3000 mL via INTRAVESICAL

## 2020-11-13 MED ORDER — LANSOPRAZOLE 3 MG/ML SUSP: 15.0000 mg | Freq: Every day | ORAL | Status: DC

## 2020-11-13 MED ORDER — MUPIROCIN 2 % EX OINT
1.0000 "application " | TOPICAL_OINTMENT | Freq: Two times a day (BID) | CUTANEOUS | Status: AC
  Administered 2020-11-13 – 2020-11-17 (×10): 1 via NASAL
  Filled 2020-11-13: qty 22

## 2020-11-13 MED ORDER — JUVEN PO PACK
1.0000 | PACK | Freq: Two times a day (BID) | ORAL | Status: DC
  Administered 2020-11-13 – 2020-11-18 (×11): 1
  Filled 2020-11-13 (×10): qty 1

## 2020-11-13 MED ORDER — FERROUS SULFATE 300 (60 FE) MG/5ML PO SYRP
300.0000 mg | ORAL_SOLUTION | Freq: Three times a day (TID) | ORAL | Status: DC
  Administered 2020-11-13 – 2020-11-18 (×16): 300 mg
  Filled 2020-11-13 (×18): qty 5

## 2020-11-13 MED ORDER — ACETAMINOPHEN 650 MG RE SUPP: 325.0000 mg | Freq: Four times a day (QID) | RECTAL | Status: DC | PRN

## 2020-11-13 MED ORDER — OXYCODONE-ACETAMINOPHEN 5-325 MG PO TABS
1.0000 | ORAL_TABLET | Freq: Two times a day (BID) | ORAL | Status: DC | PRN
Start: 2020-11-13 — End: 2020-11-18
  Administered 2020-11-16: 1
  Filled 2020-11-13: qty 1

## 2020-11-13 MED ORDER — ALBUTEROL SULFATE HFA 108 (90 BASE) MCG/ACT IN AERS
2.0000 | INHALATION_SPRAY | Freq: Every day | RESPIRATORY_TRACT | Status: DC
  Administered 2020-11-13 – 2020-11-18 (×6): 2 via RESPIRATORY_TRACT
  Filled 2020-11-13: qty 6.7

## 2020-11-13 MED ORDER — PHENYLEPHRINE 40 MCG/ML (10ML) SYRINGE FOR IV PUSH (FOR BLOOD PRESSURE SUPPORT)
PREFILLED_SYRINGE | INTRAVENOUS | Status: DC | PRN
  Administered 2020-11-13 (×3): 80 ug via INTRAVENOUS

## 2020-11-13 MED ORDER — VASOPRESSIN 20 UNIT/ML IV SOLN
INTRAVENOUS | Status: DC | PRN
  Administered 2020-11-13 (×3): 1 [IU] via INTRAVENOUS

## 2020-11-13 MED ORDER — PHENYLEPHRINE HCL (PRESSORS) 10 MG/ML IV SOLN
INTRAVENOUS | Status: AC
  Filled 2020-11-13: qty 1

## 2020-11-13 MED ORDER — POLYETHYLENE GLYCOL 3350 17 G PO PACK
17.0000 g | PACK | Freq: Every day | ORAL | Status: DC
  Administered 2020-11-13 – 2020-11-18 (×5): 17 g
  Filled 2020-11-13 (×5): qty 1

## 2020-11-13 MED ORDER — PROPOFOL 10 MG/ML IV BOLUS
INTRAVENOUS | Status: DC | PRN
  Administered 2020-11-13: 50 mg via INTRAVENOUS

## 2020-11-13 MED ORDER — ALBUMIN HUMAN 5 % IV SOLN
INTRAVENOUS | Status: AC
  Filled 2020-11-13: qty 250

## 2020-11-13 MED ORDER — ONDANSETRON HCL 4 MG/2ML IJ SOLN: 4.0000 mg | Freq: Four times a day (QID) | INTRAMUSCULAR | Status: DC | PRN

## 2020-11-13 MED ORDER — PHENYLEPHRINE HCL-NACL 10-0.9 MG/250ML-% IV SOLN
0.0000 ug/min | INTRAVENOUS | Status: DC
  Administered 2020-11-13: 70 ug/min via INTRAVENOUS
  Filled 2020-11-13 (×3): qty 250

## 2020-11-13 MED ORDER — CEFAZOLIN SODIUM-DEXTROSE 2-3 GM-%(50ML) IV SOLR
INTRAVENOUS | Status: DC | PRN
  Administered 2020-11-13: 2 g via INTRAVENOUS

## 2020-11-13 MED ORDER — ONDANSETRON HCL 4 MG PO TABS
4.0000 mg | ORAL_TABLET | Freq: Four times a day (QID) | ORAL | Status: DC | PRN
Start: 2020-11-13 — End: 2020-11-18

## 2020-11-13 MED ORDER — FENTANYL CITRATE (PF) 100 MCG/2ML IJ SOLN: 25.0000 ug | INTRAMUSCULAR | Status: DC | PRN

## 2020-11-13 MED ORDER — ONDANSETRON HCL 4 MG/2ML IJ SOLN
INTRAMUSCULAR | Status: DC | PRN
  Administered 2020-11-13: 4 mg via INTRAVENOUS

## 2020-11-13 MED ORDER — PROBIOTIC ACIDOPHILUS PO CAPS: 1.0000 | ORAL_CAPSULE | Freq: Every day | ORAL | Status: DC

## 2020-11-13 MED ORDER — NOREPINEPHRINE 4 MG/250ML-% IV SOLN
0.0000 ug/min | INTRAVENOUS | Status: DC
  Administered 2020-11-13: 7 ug/min via INTRAVENOUS
  Administered 2020-11-13: 2 ug/min via INTRAVENOUS
  Administered 2020-11-14: 4 ug/min via INTRAVENOUS
  Filled 2020-11-13 (×3): qty 250

## 2020-11-13 MED ORDER — CHLORHEXIDINE GLUCONATE CLOTH 2 % EX PADS
6.0000 | MEDICATED_PAD | Freq: Every day | CUTANEOUS | Status: DC
  Administered 2020-11-13 – 2020-11-18 (×6): 6 via TOPICAL

## 2020-11-13 MED ORDER — OSMOLITE 1.5 CAL PO LIQD
1000.0000 mL | ORAL | Status: DC
  Administered 2020-11-13 – 2020-11-16 (×3): 1000 mL
  Filled 2020-11-13 (×4): qty 1000

## 2020-11-13 MED ORDER — ACETAMINOPHEN 500 MG PO TABS
1000.0000 mg | ORAL_TABLET | Freq: Two times a day (BID) | ORAL | Status: DC
  Administered 2020-11-13 – 2020-11-15 (×4): 1000 mg via ORAL
  Filled 2020-11-13 (×4): qty 2

## 2020-11-13 MED ORDER — AMISULPRIDE (ANTIEMETIC) 5 MG/2ML IV SOLN: 5.0000 mg | Freq: Once | INTRAVENOUS | Status: DC | PRN

## 2020-11-13 MED ORDER — DULOXETINE HCL 30 MG PO CPEP
30.0000 mg | ORAL_CAPSULE | Freq: Every day | ORAL | Status: DC
  Administered 2020-11-13 – 2020-11-18 (×6): 30 mg via ORAL
  Filled 2020-11-13 (×6): qty 1

## 2020-11-13 MED ORDER — MIDODRINE HCL 5 MG PO TABS
10.0000 mg | ORAL_TABLET | Freq: Two times a day (BID) | ORAL | Status: DC
  Administered 2020-11-13: 10 mg via ORAL
  Filled 2020-11-13: qty 2

## 2020-11-13 MED ORDER — PHENYLEPHRINE CONCENTRATED 100MG/250ML (0.4 MG/ML) INFUSION SIMPLE
0.0000 ug/min | INTRAVENOUS | Status: DC
Start: 2020-11-13 — End: 2020-11-13
  Filled 2020-11-13: qty 250

## 2020-11-13 MED ORDER — FENTANYL CITRATE (PF) 100 MCG/2ML IJ SOLN
INTRAMUSCULAR | Status: DC | PRN
  Administered 2020-11-13 (×2): 25 ug via INTRAVENOUS

## 2020-11-13 MED ORDER — ONDANSETRON HCL 4 MG/2ML IJ SOLN: 4.0000 mg | Freq: Once | INTRAMUSCULAR | Status: DC | PRN

## 2020-11-13 MED ORDER — RISAQUAD PO CAPS
1.0000 | ORAL_CAPSULE | Freq: Every day | ORAL | Status: DC
  Administered 2020-11-13 – 2020-11-18 (×6): 1 via ORAL
  Filled 2020-11-13 (×6): qty 1

## 2020-11-13 MED ORDER — MIDODRINE HCL 5 MG PO TABS
10.0000 mg | ORAL_TABLET | Freq: Three times a day (TID) | ORAL | Status: DC
  Administered 2020-11-13 – 2020-11-18 (×15): 10 mg
  Filled 2020-11-13 (×15): qty 2

## 2020-11-13 MED ORDER — PREGABALIN 100 MG PO CAPS
100.0000 mg | ORAL_CAPSULE | Freq: Two times a day (BID) | ORAL | Status: DC
  Administered 2020-11-13 – 2020-11-15 (×5): 100 mg via ORAL
  Filled 2020-11-13 (×5): qty 1

## 2020-11-13 MED ORDER — ACETAMINOPHEN 325 MG PO TABS: 325.0000 mg | ORAL_TABLET | Freq: Four times a day (QID) | ORAL | Status: DC | PRN

## 2020-11-13 MED ORDER — ALBUMIN HUMAN 5 % IV SOLN: INTRAVENOUS | Status: DC | PRN

## 2020-11-13 MED ORDER — SCOPOLAMINE 1 MG/3DAYS TD PT72
1.0000 | MEDICATED_PATCH | TRANSDERMAL | Status: DC
  Administered 2020-11-15 – 2020-11-18 (×2): 1.5 mg via TRANSDERMAL
  Filled 2020-11-13 (×2): qty 1

## 2020-11-13 MED ORDER — ACETAMINOPHEN 10 MG/ML IV SOLN: 1000.0000 mg | Freq: Once | INTRAVENOUS | Status: DC | PRN

## 2020-11-13 MED ORDER — PHENYLEPHRINE HCL-NACL 10-0.9 MG/250ML-% IV SOLN: 0.0000 ug/min | INTRAVENOUS | Status: DC

## 2020-11-13 MED ORDER — ORAL CARE MOUTH RINSE
15.0000 mL | Freq: Two times a day (BID) | OROMUCOSAL | Status: DC
  Administered 2020-11-13 – 2020-11-18 (×11): 15 mL via OROMUCOSAL

## 2020-11-13 SURGICAL SUPPLY — 20 items
BAG DRAINAGE 600ML DEPOT (BAG) ×1 IMPLANT
BAG DRN 24 TWST VLV ADJ (BAG) ×1
BAG DRN RND TRDRP ANRFLXCHMBR (UROLOGICAL SUPPLIES) ×1
BAG URINE DRAIN 2000ML AR STRL (UROLOGICAL SUPPLIES) ×1 IMPLANT
BAG URO CATCHER STRL LF (MISCELLANEOUS) ×2 IMPLANT
CATH FOLEY 3WAY 30CC 22FR (CATHETERS) ×1 IMPLANT
CATH INTERMIT  6FR 70CM (CATHETERS) ×2 IMPLANT
CLOTH BEACON ORANGE TIMEOUT ST (SAFETY) ×2 IMPLANT
GLOVE SURG ENC MOIS LTX SZ6.5 (GLOVE) ×2 IMPLANT
GOWN STRL REUS W/ TWL LRG LVL3 (GOWN DISPOSABLE) ×1 IMPLANT
GOWN STRL REUS W/TWL LRG LVL3 (GOWN DISPOSABLE) ×6 IMPLANT
GUIDEWIRE STR DUAL SENSOR (WIRE) ×2 IMPLANT
KIT TURNOVER KIT A (KITS) ×2 IMPLANT
LOOP CUT BIPOLAR 24F LRG (ELECTROSURGICAL) ×1 IMPLANT
MANIFOLD NEPTUNE II (INSTRUMENTS) ×2 IMPLANT
PACK CYSTO (CUSTOM PROCEDURE TRAY) ×2 IMPLANT
SYR TOOMEY IRRIG 70ML (MISCELLANEOUS) ×2
SYRINGE TOOMEY IRRIG 70ML (MISCELLANEOUS) IMPLANT
TUBING CONNECTING 10 (TUBING) ×2 IMPLANT
TUBING UROLOGY SET (TUBING) IMPLANT

## 2020-11-13 NOTE — H&P (Signed)
History and Physical    Shannon [name] VZD:[id number] DOB: 17-Aug-1945 DOA: 11/12/2020  PCP: [name], MD  Patient coming from: [facility]    Chief Complaint:  Chief Complaint  Patient presents with  . Hematuria  . 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  Patient underwent Foley catheter placement earlier in the day on 5/22.  Upon attempting Foley catheter placement patient was not exhibiting any urine output.  When this Foley catheter was removed patient suddenly began to exhibit rapid gross hematuria.  Patient reports some associated abdominal pain but has not detailing his symptoms further.    EMS was contacted and the patient was promptly brought into [facility] emergency department for evaluation.  Upon evaluation in the emergency department patient was found to have ongoing gross hematuria.  CT imaging of the abdomen and pelvis revealed a moderate amount of blood in the bladder with bilateral subcentimeter nonobstructing renal calculi.  A 22 French three-way Foley catheter was then placed and continuous bladder irrigation was initiated.  Dr. Arita Miss with urology was then contacted who recommended manual bladder irrigation however patient was unable to tolerate this and therefore Dr. Arita Miss promptly came in and took the patient to the operating room for evacuation of clots and fulguration of the bladder. The hospitalist group has additionally been called to assess the patient for admission to the hospital.  Review of Systems:   Review of Systems   Gastrointestinal: Positive for abdominal pain.  Genitourinary: Positive for hematuria.    Past Medical History:  Diagnosis Date  . Asthma   . 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 Newell Coral, 1992-->surg at [facility].  Summer 2021->Cervicalgia,arm pain, hand atrophy RUE wkness, hyperreflex (Dr. 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 Hay fever   . Hypertension   . Osteoarthritis of both hands     Past Surgical History:  Procedure Laterality Date  . ANTERIOR CERVICAL DECOMP/DISCECTOMY FUSION N/A 04/05/2020   Procedure: ANTERIOR CERVICAL DECOMPRESSION/DISCECTOMY FUSION, INTERBODY PROSTHESIS, PLATE/SCREWS CERVICAL THREE-CERVICAL FOUR, CERVICAL FOUR- CERVICAL FIVE;  Surgeon: Tressie Stalker, MD;  Location: [facility] OR;  Service: Neurosurgery;  Laterality: N/A;  . ANTERIOR CERVICAL DECOMP/DISCECTOMY FUSION N/A 04/22/2020   Procedure: Reexploration of anterior cervical wound for epidural hematoma;  Surgeon: Donalee Citrin, MD;  Location: [facility] OR;  Service: Neurosurgery;  Laterality: N/A;  . BACK SURGERY    . INCISION AND DRAINAGE Left 2012   L hand infection  . IR GASTROSTOMY TUBE MOD SED  07/05/2020  . ROTATOR CUFF REPAIR Right    x 3  . SPINE SURGERY       reports that he has quit smoking. He has never used smokeless tobacco. He reports previous alcohol use. He reports that he does not use drugs.  Allergies  Allergen Reactions  . Iodine Swelling  . Penicillins Swelling    ** tolerates cephalosporins Facial swelling, itchy throat    Family History  Problem Relation Age of Onset  . Heart attack Mother   . Heart  disease Mother   . High blood pressure Mother   . Alcohol abuse Father   . Cancer Father   . Diabetes Father   . High blood pressure Father      Prior to Admission medications   Medication Sig Start Date End Date Taking? Authorizing  Provider  albuterol (VENTOLIN HFA) 108 (90 Base) MCG/ACT inhaler Inhale 2 puffs into the lungs daily.   Yes [provider]  AMINO ACIDS-PROTEIN HYDROLYS PO Take by mouth 2 (two) times daily between meals. 17-100 gram/ Kcal/30 mL   (Also known as Pro -Stat)   Yes [provider]  ascorbic acid (VITAMIN C) 500 MG tablet Take 1,000 mg by mouth daily.   Yes [provider]  aspirin 81 MG chewable tablet Chew 81 mg by mouth every morning.   Yes [provider]  Cranberry 400 MG CAPS Take 1 capsule by mouth daily at 6 (six) AM.   Yes [provider]  docusate (COLACE) 50 MG/5ML liquid Place 100 mg into feeding tube 2 (two) times daily.   Yes [provider]  DULoxetine (CYMBALTA) 30 MG capsule Take 30 mg by mouth daily.   Yes [provider]  Ferrous Sulfate 220 (44 Fe) MG/5ML LIQD Give 5 mLs by tube in the morning, at noon, and at bedtime. Via G tube   Yes [provider]  Lactobacillus (PROBIOTIC ACIDOPHILUS) CAPS Place 1 capsule into feeding tube daily.   Yes [provider]  midodrine (PROAMATINE) 10 MG tablet Take 1 tablet (10 mg total) by mouth 2 (two) times daily with a meal. 06/06/20  Yes Zannie Cove, MD  Multiple Vitamins-Minerals (ZINC PO) Place 220 mg into feeding tube daily.   Yes [provider]  omeprazole (PRILOSEC) 20 MG capsule Take 20 mg by mouth every other day. Per Tube   Yes [provider]  oxyCODONE (OXY IR/ROXICODONE) 5 MG immediate release tablet Take 0.5 tablets (2.5 mg total) by mouth daily as needed for moderate pain or severe pain. Patient taking differently: Take 2.5 mg by mouth 2 (two) times daily as needed for moderate pain or severe pain. 07/11/20  Yes Lurene Shadow, MD  oxyCODONE-acetaminophen (PERCOCET/ROXICET) 5-325 MG tablet Take 1 tablet by mouth 2 (two) times daily.   Yes [provider]  polyethylene glycol (MIRALAX / GLYCOLAX) 17 g packet Place 17 g into  feeding tube daily.   Yes [provider]  scopolamine (TRANSDERM-SCOP) 1 MG/3DAYS Place 1 patch onto the skin every 3 (three) days.   Yes [provider]  tamsulosin (FLOMAX) 0.4 MG CAPS capsule Take 1 capsule (0.4 mg total) by mouth daily. 04/28/20  Yes Tressie Stalker, MD  acetaminophen (TYLENOL) 500 MG tablet Take 1,000 mg by mouth in the morning and at bedtime.    [provider]  ertapenem (INVANZ) IVPB Inject 1 g into the vein. Daily for 6 weeks Per MAR until 11/08/20    [provider]  pregabalin (LYRICA) 100 MG capsule Take 100 mg by mouth 2 (two) times daily.    [provider]  vancomycin IVPB Inject 750 mg into the vein every 12 (twelve) hours.    [provider]    Physical Exam: Vitals:   11/13/20 0247 11/13/20 0300 11/13/20 0312 11/13/20 0327  BP:  (!) 74/43 (!) 88/43 (!) 104/52  Pulse:  (!) 107  96  Resp:  14  15  Temp: 98.3 F (36.8 C) 97.8 F (36.6 C)  97.9 F (36.6 C)  TempSrc: Oral Axillary  Axillary  SpO2:  99%  100%  Weight: 89.4 kg     Height: 5\' 7"  (1.702 m)       Constitutional: Awake alert and oriented x3, no associated distress.   Skin: Increased skin pallor noted.  Approximate 6 cm diameter stage IV sacral wound noted without any evidence of active drainage, foul smell or necrotic tissue.  good skin turgor noted. Eyes: Pupils are equally reactive to light.  No evidence of scleral icterus or conjunctival pallor.  ENMT: Moist mucous membranes noted.  Posterior pharynx clear of any exudate or lesions.   Neck: normal, supple, no masses, no thyromegaly.  No evidence of jugular venous distension.   Respiratory: Mild bibasilar rales.  No evidence of wheezing.  Normal respiratory effort. No accessory muscle use.  Cardiovascular: Regular rate and rhythm, no murmurs / rubs / gallops. No extremity edema. 2+ pedal pulses. No carotid bruits.  Chest:   Nontender without crepitus or deformity.   Back:   Nontender  without crepitus or deformity. Abdomen: PEG tube is noted to be in place.  Abdomen is soft and nontender.  No evidence of intra-abdominal masses.  Positive bowel sounds noted in all quadrants.   Musculoskeletal: Extremely poor muscle tone noted of all 4 extremities.  No joint deformity upper and lower extremities. Good ROM, no contractures.  Neurologic: CN 2-12 grossly intact.  Patient exhibits quadriplegia with minimal movement of the distal muscle groups of all 4 extremities.  Patient is following all commands.  Patient is responsive to verbal stimuli.   Psychiatric: Patient exhibits depressed mood with flat affect.  Patient seems to possess insight as to their current situation.         Labs on Admission: I have personally reviewed following labs and imaging studies -   CBC: Recent Labs  Lab 11/12/20 2000  WBC 7.7  NEUTROABS 6.2  HGB 7.1*  HCT 26.6*  MCV 80.6  PLT 264   Basic Metabolic Panel: Recent Labs  Lab 11/12/20 2000  NA 135  K 3.9  CL 102  CO2 25  GLUCOSE 175*  BUN 35*  CREATININE 0.65  CALCIUM 8.6*   GFR: Estimated Creatinine Clearance: 86.4 mL/min (by C-G formula based on SCr of 0.65 mg/dL). Liver Function Tests: Recent Labs  Lab 11/12/20 2000  AST 24  ALT 10  ALKPHOS 113  BILITOT 0.5  PROT 7.9  ALBUMIN 2.1*   No results for input(s): LIPASE, AMYLASE in the last 168 hours. No results for input(s): AMMONIA in the last 168 hours. Coagulation Profile: Recent Labs  Lab 11/12/20 2000  INR 1.2   Cardiac Enzymes: No results for input(s): CKTOTAL, CKMB, CKMBINDEX, TROPONINI in the last 168 hours. BNP (last 3 results) No results for input(s): PROBNP in the last 8760 hours. HbA1C: No results for input(s): HGBA1C in the last 72 hours. CBG: No results for input(s): GLUCAP in the last 168 hours. Lipid Profile: No results for input(s): CHOL, HDL, LDLCALC, TRIG, CHOLHDL, LDLDIRECT in the last 72 hours. Thyroid Function Tests: No results for input(s):  TSH, T4TOTAL, FREET4, T3FREE, THYROIDAB in the last 72 hours. Anemia Panel: No results for input(s): VITAMINB12, FOLATE, FERRITIN, TIBC, IRON, RETICCTPCT in the last 72 hours. Urine analysis:    Component Value Date/Time   COLORURINE RED (A) 11/12/2020 2000   APPEARANCEUR TURBID (A) 11/12/2020 2000   LABSPEC CLINITEK WILL NOT READ OR ACCEPT STRIP 11/12/2020 2000   PHURINE  11/12/2020 2000    TEST NOT REPORTED  DUE TO COLOR INTERFERENCE OF URINE PIGMENT   GLUCOSEU (A) 11/12/2020 2000    TEST NOT REPORTED DUE TO COLOR INTERFERENCE OF URINE PIGMENT   HGBUR (A) 11/12/2020 2000    TEST NOT REPORTED DUE TO COLOR INTERFERENCE OF URINE PIGMENT   BILIRUBINUR (A) 11/12/2020 2000    TEST NOT REPORTED DUE TO COLOR INTERFERENCE OF URINE PIGMENT   KETONESUR (A) 11/12/2020 2000    TEST NOT REPORTED DUE TO COLOR INTERFERENCE OF URINE PIGMENT   PROTEINUR (A) 11/12/2020 2000    TEST NOT REPORTED DUE TO COLOR INTERFERENCE OF URINE PIGMENT   NITRITE (A) 11/12/2020 2000    TEST NOT REPORTED DUE TO COLOR INTERFERENCE OF URINE PIGMENT   LEUKOCYTESUR (A) 11/12/2020 2000    TEST NOT REPORTED DUE TO COLOR INTERFERENCE OF URINE PIGMENT    Radiological Exams on Admission - Personally Reviewed: CT Renal Stone Study  Result Date: 11/12/2020 CLINICAL DATA:  Hematuria following Foley catheter replacement. EXAM: CT ABDOMEN AND PELVIS WITHOUT CONTRAST TECHNIQUE: Multidetector CT imaging of the abdomen and pelvis was performed following the standard protocol without IV contrast. COMPARISON:  June 19, 2020 FINDINGS: Lower chest: There is elevation of the right hemidiaphragm with moderate severity atelectasis and/or infiltrate seen within the posterior aspect of the bilateral lung bases. A small left pleural effusion is seen. Hepatobiliary: No focal liver abnormality is seen. A 1.0 cm gallstone is seen within the lumen of an otherwise normal-appearing gallbladder. Pancreas: Unremarkable. No pancreatic ductal dilatation  or surrounding inflammatory changes. Spleen: Normal in size without focal abnormality. Adrenals/Urinary Tract: Adrenal glands are unremarkable. Kidneys are normal in size, bilaterally. A 4 mm nonobstructing renal stone is seen within the lateral aspect of the mid right kidney. 3 mm nonobstructing renal stones are seen within the posterior aspect of the mid left kidney. There is mild to moderate severity bilateral hydronephrosis and hydroureter, without evidence of obstructing renal stones. An 8 mm calcification is seen within the posterolateral wall of the urinary bladder on the left (axial CT image 84, CT series number 2). This is adjacent to the left UVJ. A Foley catheter, a moderate amount of mildly increased attenuation (approximately 68.67 Hounsfield units) and a mild amount of air are seen within the bladder lumen. Stomach/Bowel: A percutaneous gastrostomy tube is in place. Its distal tip and insufflator bulb are seen within the body of the stomach. Appendix appears normal. No evidence of bowel dilatation. Noninflamed diverticula are seen within the large bowel. Vascular/Lymphatic: Aortic atherosclerosis. No enlarged abdominal or pelvic lymph nodes. Reproductive: There is mild to moderate severity prostate gland enlargement. Other: Mild to moderate severity anasarca is seen throughout the abdominal and pelvic walls. No abdominopelvic ascites. Musculoskeletal: Degenerative changes seen throughout the lumbar spine. IMPRESSION: 1. Moderate amount of acute blood products within the urinary bladder lumen, likely secondary to sequelae associated with the patient's recent Foley catheter replacement. 2. Bilateral subcentimeter nonobstructing renal calculi. 3. Moderate severity bibasilar atelectasis and/or infiltrate. 4. Cholelithiasis. 5. Colonic diverticulosis. Electronically Signed   By: Aram Candelahaddeus  Houston M.D.   On: 11/12/2020 23:29    EKG: Personally reviewed.  Rhythm is sinus tachycardia with heart rate of 160  bpm.  No dynamic ST segment changes appreciated.  Assessment/Plan Principal Problem:   Gross hematuria   Patient presenting with profound rapid gross hematuria upon arrival to the emergency department  Hematuria likely secondary to inadvertent urethral trauma with Foley catheter exchange at skilled nursing facility on 5/22  Due to patient not being able  to tolerate manual bladder irrigation Dr. Arita Miss with urology promptly evaluated the patient at the bedside in the emergency department and take him to the operating room for manual clot evacuation and fulguration of the bladder.  Procedure was successful and patient is now in the intensive care unit with no evidence of continued hematuria  Currently providing patient with 2 units of packed red blood cell transfusion for significant blood loss that persisted even after the hemoglobin of 7.1 was obtained upon arrival to our facility.  We will perform posttransfusion H&H.  Monitoring for bleeding recurrence.  Holding home regimen of aspirin therapy  Neurology recommendations are appreciated.  Active Problems:   Quadriplegia and quadriparesis (HCC)   Patient developed quadriplegia after the unfortunate event of having a traumatic fall 13 days after a C3-4 and C4-5 anterior cervical discectomy decompression on 10/13.  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  Patient now possesses an indwelling Foley catheter for neurogenic bladder  Patient additionally had a PEG tube placed in January 2022 for associated protein calorie malnutrition  Patient has had frequent hospitalizations for urinary tract infections  Patient's course as of late has also been complicated by the development of a substantial stage IV sacral wound with associated osteomyelitis  Patient is now bedbound and  resides at [facility] skilled nursing facility  Acute blood loss anemia superimposed on chronic vitamin B12 deficiency and anemia of chronic disease   Hemoglobin 7.1 on arrival with continued rapid significant bleeding after the samples obtained  Proceeding with 2 unit packed red blood cell transfusion which is continuing upon arrival to the ICU from the operating room  We will perform repeat hemoglobin and hematocrit after transfusion is complete and continue to transfuse as necessary to achieve hemoglobin of greater than 7  Hypotension   Patient exhibiting intermittent hypotension upon arrival to the intensive care unit and is currently on a Neo-Synephrine infusion ordered by anesthesia  Patient is currently receiving packed red blood cell transfusion and I am in the hopes that we can wean the patient off of Neo-Synephrine as the transfusion is being provided.  Target systolic blood pressures greater than 90 and target maps greater than 65.  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    Code Status:  Full code Family Communication: Patient's daughter has been updated on plan of care by urology  Status is: Inpatient  Remains inpatient appropriate because:Hemodynamically unstable, IV treatments appropriate due to intensity of illness or inability to take PO and Inpatient level of care appropriate due to severity of illness   Dispo: The patient is from:  SNF              Anticipated d/c is to: SNF              Patient currently is not medically stable to d/c.   Difficult to place patient No  Marinda Elk MD [facility] Pager [phone]  If 7PM-7AM, please contact night-coverage [email or web address] Use universal [place] password for that web site. If you do not have the password, please call the hospital operator.  11/13/2020, 5:02 AM

## 2020-11-13 NOTE — ED Notes (Signed)
Informed Consent signed and at bedside with patient.

## 2020-11-13 NOTE — Anesthesia Procedure Notes (Signed)
Procedure Name: LMA Insertion Date/Time: 11/13/2020 1:04 AM Performed by: Epimenio Sarin, CRNA Pre-anesthesia Checklist: Patient identified, Emergency Drugs available, Suction available, Patient being monitored and Timeout performed Patient Re-evaluated:Patient Re-evaluated prior to induction Oxygen Delivery Method: Circle system utilized Preoxygenation: Pre-oxygenation with 100% oxygen Induction Type: IV induction Ventilation: Mask ventilation without difficulty LMA: LMA with gastric port inserted LMA Size: 4.0 Number of attempts: 1 Dental Injury: Teeth and Oropharynx as per pre-operative assessment

## 2020-11-13 NOTE — ED Notes (Signed)
Patient transported to OR.

## 2020-11-13 NOTE — Consult Note (Addendum)
WOC Nurse Consult Note: Reason for Consult: Chronic, nonhealing full thickness Stage 4 pressure injury to sacrum Wound type:Pressure Pressure Injury POA: Yes/No/NA Measurement: 8cm x 8cm x 1.6cm with undermining from 12-4 o'clock measuring 1.4cm and undermining from 4-6 o'clock measuring 2cm. Wound bed: Pale red, moist. No nonviable tissue Drainage (amount, consistency, odor) Serous Periwound: intact with evidence of wound healing and contraction, particularly from 7-10 o'cock Dressing procedure/placement/frequency: Turning and repositioning is in place, patient is wearing bilateral Prevalon boots.  Wound photo is appreciated. Topical care while in house will be to line the defect with a silver hydrofiber dressing, tucking it into any areas of undermining. This will be covered with fluffed dry gauze to bring the dressing to skin level and then topped with silicone foam dressing for the sacrum. Dressing is to be changed daily and PRN drainage strike-through or soiling.  The area noted as a Stage 1 to the left elbow is actually a healed full thickness wound with scar tissue covering.  WOC nursing team will not follow, but will remain available to this patient, the nursing and medical teams.  Please re-consult if needed. Thanks, Ladona Mow, MSN, RN, GNP, [place]  Pager# [phone]

## 2020-11-13 NOTE — Anesthesia Preprocedure Evaluation (Addendum)
Anesthesia Evaluation  Patient identified by MRN, date of birth, ID bandGeneral Assessment Comment:Patient complaining of pain. Responds to verbal stimuli.   Reviewed: Allergy & Precautions, NPO status , Patient's Chart, lab work & pertinent test results  Airway Mallampati: III  TM Distance: >3 FB Neck ROM: Full    Dental  (+) Poor Dentition, Missing   Pulmonary asthma , former smoker,  Oxygen dependent   Pulmonary exam normal breath sounds clear to auscultation       Cardiovascular hypertension,  Rhythm:Regular Rate:Tachycardia  ECG: ST, rate 160   Neuro/Psych Quadraparesis  Neuromuscular disease    GI/Hepatic negative GI ROS, Neg liver ROS,   Endo/Other  negative endocrine ROS  Renal/GU negative Renal ROS     Musculoskeletal  (+) Arthritis , Sacral decubitus ulcer   Abdominal   Peds  Hematology  (+) anemia ,   Anesthesia Other Findings URINARY OBSTRUCTION  Reproductive/Obstetrics                             Anesthesia Physical Anesthesia Plan  ASA: IV and emergent  Anesthesia Plan: General   Post-op Pain Management:    Induction: Intravenous  PONV Risk Score and Plan: 2  Airway Management Planned: LMA  Additional Equipment:   Intra-op Plan:   Post-operative Plan: Possible Post-op intubation/ventilation  Informed Consent: I have reviewed the patients History and Physical, chart, labs and discussed the procedure including the risks, benefits and alternatives for the proposed anesthesia with the patient or authorized representative who has indicated his/her understanding and acceptance.     Dental advisory given  Plan Discussed with: CRNA and Surgeon  Anesthesia Plan Comments:        Anesthesia Quick Evaluation

## 2020-11-13 NOTE — Anesthesia Postprocedure Evaluation (Signed)
Anesthesia Post Note  Patient: Shannon [name]  Procedure(s) Performed: CYSTOSCOPY, CLOT EVACUATION, FULGURATION (N/A )     Patient location during evaluation: PACU Anesthesia Type: General Level of consciousness: awake Pain management: pain level controlled Vital Signs Assessment: post-procedure vital signs reviewed and stable Respiratory status: spontaneous breathing, nonlabored ventilation, respiratory function stable and patient connected to nasal cannula oxygen Cardiovascular status: blood pressure returned to baseline and stable Postop Assessment: no apparent nausea or vomiting Anesthetic complications: no   No complications documented.  Last Vitals:  Vitals:   11/13/20 0200 11/13/20 0215  BP: 104/61 (!) 89/41  Pulse: 98 (!) 108  Resp: 14 15  Temp: 37.2 C   SpO2: 99% 98%    Last Pain:  Vitals:   11/13/20 0200  TempSrc:   PainSc: 0-No pain                 Maxx Calaway P Ramonia Mcclaran

## 2020-11-13 NOTE — Progress Notes (Addendum)
[facility])       This patient is an active hospice patient with [facility], admitted on 10/27/20 with a terminal diagnosis of abnormal weight loss.  Per [facility] notes on call was notified by [facility] nurse yesterday that the chronic indwelling Foley had been swapped out with a 16 Fr. Coude with gross hematuria on return.  Pt presented to [facility] where he was taken in for emergent surgery to address the situation. Per Dr. Kirt Boys, [facility] MD, this is a related admission.  This liaison attempted to contact pt's daughter, [name], at [phone].  A HIPAA compliant message was left on an unidentified voice mail box.    Thank you for the opportunity to participate in this patient's care.     Gillian Scarce, BSN, RN [facility] Liaison [phone] (24h on call)

## 2020-11-13 NOTE — Progress Notes (Signed)
[facility])       This patient is an active hospice patient with [facility], admitted on 10/27/20 with a terminal diagnosis of abnormal weight loss.  Per [facility] notes on call was notified by [facility] nurse yesterday that the chronic indwelling Foley had been swapped out with a 16 Fr. Coude with gross hematuria on return.  Pt presented to [name] where he was taken in for emergent surgery to address the situation. Per Dr. Kirt Boys, [name] MD, this is a related admission.  Visited the patient at bedside.  AO to self, denies pain or distress. Foley bag draining clear, peach tinged urine. +2 edema noted to bilat hands.  Pt about to start third unit of RBC during visit.  Exchanged report with bedside RN Enid Derry. Discussed visitor policy with AD Denny Peon who stated that, due to severity of pt's condition and being on hospice services, pt's daughter would be allowed to visit in addition to two visitors, with a limit of two visitors at a time. This was shared with pt's daughter via text.     This liaison attempted to contact pt's daughter, Shannon [name], at [phone].  A HIPAA compliant message was left on an unidentified voice mail box.  Addendum: return call received.  Shannon [name] confirmed code status as : FULL, states desire for full interventions for her father.   This pt is appropriate for inpatient services due to the need for continuous fluids and IV medications.  Vitals:  99.6 axillary, 101 HR, 14 RR, 98% 2L [place]  I&O's:  1826.7/500  Abnormal Labs: BUN 29, Creat 0.57, Ca 7.9, WBC 11.4, Hgb 5.9, HCT 19.0, Monocyte# 1.8  Diagnostics:  CT Renal Stone Study   Result Date: 11/12/2020 CLINICAL DATA:  Hematuria following Foley catheter replacement. EXAM: CT ABDOMEN AND PELVIS WITHOUT CONTRAST TECHNIQUE: Multidetector CT imaging of the abdomen and pelvis was performed following the standard protocol without IV contrast. COMPARISON:  June 19, 2020 FINDINGS: Lower chest: There is elevation of the  right hemidiaphragm with moderate severity atelectasis and/or infiltrate seen within the posterior aspect of the bilateral lung bases. A small left pleural effusion is seen. Hepatobiliary: No focal liver abnormality is seen. A 1.0 cm gallstone is seen within the lumen of an otherwise normal-appearing gallbladder. Pancreas: Unremarkable. No pancreatic ductal dilatation or surrounding inflammatory changes. Spleen: Normal in size without focal abnormality. Adrenals/Urinary Tract: Adrenal glands are unremarkable. Kidneys are normal in size, bilaterally. A 4 mm nonobstructing renal stone is seen within the lateral aspect of the mid right kidney. 3 mm nonobstructing renal stones are seen within the posterior aspect of the mid left kidney. There is mild to moderate severity bilateral hydronephrosis and hydroureter, without evidence of obstructing renal stones. An 8 mm calcification is seen within the posterolateral wall of the urinary bladder on the left (axial CT image 84, CT series number 2). This is adjacent to the left UVJ. A Foley catheter, a moderate amount of mildly increased attenuation (approximately 68.67 Hounsfield units) and a mild amount of air are seen within the bladder lumen. Stomach/Bowel: A percutaneous gastrostomy tube is in place. Its distal tip and insufflator bulb are seen within the body of the stomach. Appendix appears normal. No evidence of bowel dilatation. Noninflamed diverticula are seen within the large bowel. Vascular/Lymphatic: Aortic atherosclerosis. No enlarged abdominal or pelvic lymph nodes. Reproductive: There is mild to moderate severity prostate gland enlargement. Other: Mild to moderate severity anasarca is seen throughout the abdominal and pelvic walls. No abdominopelvic ascites.  Musculoskeletal: Degenerative changes seen throughout the lumbar spine. IMPRESSION: 1. Moderate amount of acute blood products within the urinary bladder lumen, likely secondary to sequelae associated with  the patient's recent Foley catheter replacement. 2. Bilateral subcentimeter nonobstructing renal calculi. 3. Moderate severity bibasilar atelectasis and/or infiltrate. 4. Cholelithiasis. 5. Colonic diverticulosis. Electronically Signed   By: Aram Candela M.D.   On: 11/12/2020 23:29     IV/PRN meds:  LR@ 124mL/hr PIV, acetaminophen 1000mg  PIV x 1 dose, levophed 0-40 mcg/min (currently @7  mcg/min), neosynephrine PIV (now dc'ed), NS PIV bolus x 1 dose  Problem List:  Hemorrhagic shock due to massive hematuria/ chronic urinary retention. Foley bag with clear urine, no macroscopic blood at the time of my examination.  Sp 2 units PRBC transfusion.  Pending follow up Hgb and Hct.  Patient with eyes closed but mentation is appropriate  Access left PICC line.    Continue to be hypotensive with MAP 50 despite phenylephrine infusion.    Plan to change to norepinephrine infusion to add inotropic effect, target MAP of 60 mmHg.   Follow Hgb and Hct, keep hgb above 8.  Continue IV fluids with lactated ringer's solution at 100 ml per hr and check echocardiogram.  Increase midodrine to 10 mg tid.  Continue with tamsulosin.    2. 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 Stage 1 left elbow pressure ulcer.  Continue local wound care, no apparent purulence at the site.  Frequent turning per protocol.  Continue with pregabalin.    3. Swallow dysfunction/ protein calorie malnutrition severe. Continue tube feedings.  Aspiration precautions.    4. Depression. Continue with duloxetine.    5. Iron deficiency. Continue with iron supplementation.     Discharge Planning:  ongoing  Family Contact: Spoke with daughter by phone  IDT: updated   Goals of Care:  Full code, would like to be medically optimized   Thank you for the opportunity to participate in this patient's care.       , BSN, RN [facility] Liaison [phone]  (24h on call)

## 2020-11-13 NOTE — Discharge Instructions (Signed)

## 2020-11-13 NOTE — Transfer of Care (Signed)
Immediate Anesthesia Transfer of Care Note  Patient: Noach Calvillo  Procedure(s) Performed: CYSTOSCOPY, CLOT EVACUATION, FULGURATION (N/A )  Patient Location: PACU  Anesthesia Type:General  Level of Consciousness: drowsy and patient cooperative  Airway & Oxygen Therapy: Patient Spontanous Breathing and Patient connected to face mask oxygen  Post-op Assessment: Report given to RN and Post -op Vital signs reviewed and stable  Post vital signs: Reviewed and stable  Last Vitals:  Vitals Value Taken Time  BP 99/51 11/13/20 0135  Temp    Pulse 104 11/13/20 0138  Resp 13 11/13/20 0137  SpO2 100 % 11/13/20 0138  Vitals shown include unvalidated device data.  Last Pain:  Vitals:   11/12/20 2340  TempSrc:   PainSc: 8          Complications: No complications documented.

## 2020-11-13 NOTE — Progress Notes (Signed)
[facility]

## 2020-11-13 NOTE — Progress Notes (Signed)
Initial Nutrition Assessment  DOCUMENTATION CODES:   Severe malnutrition in context of chronic illness  INTERVENTION:   -Continue Osmolite 1.5 @ 60 ml/hr via PEG -45 ml Prosource TF BID -Juven Fruit Punch BID via tube, each serving provides 95kcal and 2.5g of protein (amino acids glutamine and arginine) -Provides 2430 kcals, 117g protein and 1097 ml H2O  -Free water recommendation: 150 ml every 4 hours (900 ml)   NUTRITION DIAGNOSIS:   Severe Malnutrition related to chronic illness (wounds) as evidenced by percent weight loss,severe fat depletion,severe muscle depletion.  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 Admitted to the hospital with severe hematuria, due to foley trauma complicated with hemorrhagic shock  5/23: s/p cystourethroscopy  Patient has been started on home (from facility) tube feed regimen. Osmolite 1.5 @ 60 ml/hr via PEG. Will add Prosource and Juven to aid in wound healing.   Per weight records, pt has lost 31 lbs since 07/11/20 (13% wt loss x 4 months, significant for time frame).  Per nursing documentation, pt with mild-moderate BLE edema.  Medications: Colace, Ferrous sulfate, Miralax, Levophed  Labs reviewed.  NUTRITION - FOCUSED PHYSICAL EXAM:  Flowsheet Row Most Recent Value  Orbital Region Moderate depletion  Upper Arm Region Severe depletion  Thoracic and Lumbar Region Unable to assess  Buccal Region Severe  depletion  Temple Region Severe depletion  Clavicle Bone Region Severe depletion  Clavicle and Acromion Bone Region Severe depletion  Scapular Bone Region Severe depletion  Dorsal Hand Moderate depletion  Patellar Region Unable to assess  Anterior Thigh Region Unable to assess  Posterior Calf Region Unable to assess  Edema (RD Assessment) Moderate       Diet Order:   Diet Order    None      EDUCATION NEEDS:   No education needs have been identified at this time  Skin:  Skin Assessment: Skin Integrity Issues: Skin Integrity Issues:: Stage I,Stage IV Stage I: left elbow Stage IV: sacrum -osteomyelitis  Last BM:  PTA  Height:   Ht Readings from Last 1 Encounters:  11/13/20 5\' 7"  (1.702 m)    Weight:   Wt Readings from Last 1 Encounters:  11/13/20 89.4 kg   BMI:  Body mass index is 30.87 kg/m.  Estimated Nutritional Needs:   Kcal:  2300-2500  Protein:  115-130g  Fluid:  2L/day   11/15/20, MS, RD, LDN Inpatient Clinical Dietitian Contact information available via Amion

## 2020-11-13 NOTE — Progress Notes (Signed)
PROGRESS NOTE    Shannon [name]  RFF:[id number] DOB: Feb 26, 1946 DOA: 11/12/2020 PCP: [name], MD    Brief Narrative:  Shannon [name] was admitted to the hospital with severe hematuria, due to foley trauma complicated with hemorrhagic shock.   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  Patient underwent Foley catheter exchange 5/22, developing severe hematuria and abdominal pain.  On his initial physical examination his blood pressure was 74/43, heart rate 107, respiratory rate 14, temperature 97.8, oxygen saturation 99%.  His lungs had bibasilar rales, heart S1-S2, present, rhythmic, his abdomen had PEG tube in place, positive bowel sounds, no lower extremity edema.  Large stage IV decubitus ulcer.   Sodium 135, potassium 3.9, chloride 102, bicarb 25, glucose 175, BUN 35, creatinine 0.65, white count 7.7, hemoglobin 7.1, hematocrit 26.6, platelets 264. SARS COVID-19 negative.  Urinalysis with frank hematuria, > 50 white cells.  Renal CT with moderate amount of acute blood products within the urinary bladder lumen, likely secondary to sequela I associated with recent Foley catheter placement.  Bilateral subcentimeter nonobstructing renal calculi.  Cholelithiasis.  EKG 160 bpm, normal axis, normal intervals, sinus rhythm, positive LVH, poor R wave progression, no significant ST segment T wave changes, noisy baseline.  In the emergency department patient had a 22 French three-way Foley catheter placed for continuous bladder irrigation.  Patient unable to tolerate bladder irrigation and was taken to the operating room by Dr. Arita Miss.  Patient had cystoscopy ureteroscopy, evacuation of blood clot and fulguration.  Persistent hypotension  required vasopressors and transferred to the intensive care unit.  Assessment & Plan:   Principal Problem:   Hemorrhagic shock (HCC) Active Problems:   Quadriplegia and quadriparesis (HCC)   Acute blood loss anemia   Chronic indwelling Foley catheter   Protein-calorie malnutrition, severe   Gross hematuria   Stage IV pressure ulcer of sacral region (HCC)   Anemia   1. Hemorrhagic shock due to massive hematuria/ chronic urinary retention. Foley bag with clear urine, no macroscopic blood at the time of my examination.  Sp 2 units PRBC transfusion.  Pending follow up Hgb and Hct.  Patient with eyes closed but mentation is appropriate  Access left PICC line.   Continue to be hypotensive with MAP 50 despite phenylephrine infusion.   Plan to change to norepinephrine infusion to add inotropic effect, target MAP of 60 mmHg.   Follow Hgb and Hct, keep hgb above 8.  Continue IV fluids with lactated ringer's solution at 100 ml per hr and check echocardiogram.  Increase midodrine to 10 mg tid.  Continue with tamsulosin.   2. 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 Stage 1 left elbow pressure ulcer.  Continue local wound care, no apparent purulence at the site.  Frequent turning per protocol.  Continue with pregabalin.   3. Swallow dysfunction/ protein calorie malnutrition severe. Continue tube feedings.  Aspiration precautions.   4. Depression. Continue with duloxetine.   5. Iron deficiency. Continue with iron supplementation.   Patient continue to be at high risk for worsening shock Patient is critically ill, continue vasopressors to prevent imminent deterioration. Critical care time 60 minutes.   Status is: Inpatient  Remains inpatient appropriate because:IV treatments appropriate due to intensity of illness or inability to take PO   Dispo: The  patient is from: SNF              Anticipated d/c is to: SNF              Patient currently is not  medically stable to d/c.   Difficult to place patient No   DVT prophylaxis: scd   Code Status:   full  Family Communication:         Skin Documentation: Pressure Injury 06/20/20 Vertebral column Lower;Medial Stage 2 -  Partial thickness loss of dermis presenting as a shallow open injury with a red, pink wound bed without slough. (Active)  06/20/20 0130  Location: Vertebral column  Location Orientation: Lower;Medial  Staging: Stage 2 -  Partial thickness loss of dermis presenting as a shallow open injury with a red, pink wound bed without slough.  Wound Description (Comments):   Present on Admission: Yes     Pressure Injury 06/22/20 Sacrum Posterior Stage 4 - Full thickness tissue loss with exposed bone, tendon or muscle. PHYSICAL THERAPY/HYDROTHERAPY (Active)  06/22/20 1400  Location: Sacrum  Location Orientation: Posterior  Staging: Stage 4 - Full thickness tissue loss with exposed bone, tendon or muscle.  Wound Description (Comments): PHYSICAL THERAPY/HYDROTHERAPY  Present on Admission: Yes     Pressure Injury 11/13/20 Elbow Left;Posterior Stage 1 -  Intact skin with non-blanchable redness of a localized area usually over a bony prominence. (Active)  11/13/20 0347  Location: Elbow  Location Orientation: Left;Posterior  Staging: Stage 1 -  Intact skin with non-blanchable redness of a localized area usually over a bony prominence.  Wound Description (Comments):   Present on Admission: Yes     Consultants:   Urology   Procedures:  1.  Cystourethroscopy 2.  Evacuation of blood clot 3.  Fulguration    Subjective: Patient with eyes closed, able to respond to questions appropriately, denies any nausea or vomiting, no dyspnea or chest pain. No suprapubic pain. Foley catheter is in place.    Objective: Vitals:   11/13/20 0600 11/13/20 0616 11/13/20 0730 11/13/20 0805  BP: (!) 116/52 (!) 119/39  (!) 110/58  Pulse: 97 98  99  Resp: 14 17  17   Temp: 98.7 F (37.1 C)  98.4 F (36.9 C) 98.1 F (36.7 C) 98.5 F (36.9 C)  TempSrc: Axillary Axillary Oral   SpO2: 99% 100%  100%  Weight:      Height:        Intake/Output Summary (Last 24 hours) at 11/13/2020 11/15/2020 Last data filed at 11/13/2020 0805 Gross per 24 hour  Intake 2567.54 ml  Output 10 ml  Net 2557.54 ml   Filed Weights   11/13/20 0247  Weight: 89.4 kg    Examination:   General: deconditioned  Neurology: positive quadriplegia, eyes closed but able to responds to questions appropriately.  E ENT: mild pallor, no icterus, oral mucosa moist Cardiovascular: No JVD. S1-S2 present, rhythmic, no gallops, rubs, or murmurs. Positive 2-3+ upper and lower extremity edema non pitting. Pulmonary: positive breath sounds bilaterally, adequate air movement, no wheezing, rhonchi or rales. Gastrointestinal. Abdomen soft and non tender, PEG tube in place.  Skin. Stage IV sacral ulcer, stage 1 left elbow.  Musculoskeletal: no joint deformities     Data Reviewed: I have personally reviewed following labs and imaging studies  CBC: Recent Labs  Lab 11/12/20 2000  WBC 7.7  NEUTROABS 6.2  HGB 7.1*  HCT 26.6*  MCV 80.6  PLT 264   Basic Metabolic Panel: Recent Labs  Lab  11/12/20 2000  NA 135  K 3.9  CL 102  CO2 25  GLUCOSE 175*  BUN 35*  CREATININE 0.65  CALCIUM 8.6*   GFR: Estimated Creatinine Clearance: 86.4 mL/min (by C-G formula based on SCr of 0.65 mg/dL). Liver Function Tests: Recent Labs  Lab 11/12/20 2000  AST 24  ALT 10  ALKPHOS 113  BILITOT 0.5  PROT 7.9  ALBUMIN 2.1*   No results for input(s): LIPASE, AMYLASE in the last 168 hours. No results for input(s): AMMONIA in the last 168 hours. Coagulation Profile: Recent Labs  Lab 11/12/20 2000  INR 1.2   Cardiac Enzymes: No results for input(s): CKTOTAL, CKMB, CKMBINDEX, TROPONINI in the last 168 hours. BNP (last 3 results) No results for input(s): PROBNP in the last 8760 hours. HbA1C: No results for input(s):  HGBA1C in the last 72 hours. CBG: No results for input(s): GLUCAP in the last 168 hours. Lipid Profile: No results for input(s): CHOL, HDL, LDLCALC, TRIG, CHOLHDL, LDLDIRECT in the last 72 hours. Thyroid Function Tests: No results for input(s): TSH, T4TOTAL, FREET4, T3FREE, THYROIDAB in the last 72 hours. Anemia Panel: No results for input(s): VITAMINB12, FOLATE, FERRITIN, TIBC, IRON, RETICCTPCT in the last 72 hours.    Radiology Studies: I have reviewed all of the imaging during this hospital visit personally     Scheduled Meds: . acetaminophen  1,000 mg Oral BID  . acidophilus  1 capsule Oral Daily  . albuterol  2 puff Inhalation Daily  . Chlorhexidine Gluconate Cloth  6 each Topical Daily  . docusate  100 mg Per Tube BID  . DULoxetine  30 mg Oral Daily  . feeding supplement (OSMOLITE 1.5 CAL)  1,000 mL Per Tube Q24H  . ferrous sulfate  300 mg Per Tube TID  . mouth rinse  15 mL Mouth Rinse BID  . midodrine  10 mg Oral BID WC  . mupirocin ointment  1 application Nasal BID  . pantoprazole (PROTONIX) IV  40 mg Intravenous Q24H  . polyethylene glycol  17 g Per Tube Daily  . pregabalin  100 mg Oral BID  . [START ON 11/15/2020] scopolamine  1 patch Transdermal Q72H  . sodium hypochlorite   Irrigation Daily  . tamsulosin  0.4 mg Oral Daily   Continuous Infusions: . sodium chloride    . ceFAZolin    . lactated ringers 125 mL/hr at 11/13/20 0648  . phenylephrine (NEO-SYNEPHRINE) Adult infusion 70 mcg/min (11/13/20 0648)     LOS: 1 day        Jelena Malicoat Annett Gula, MD

## 2020-11-13 NOTE — Progress Notes (Signed)
Date and time results received: 11/13/20 @11 :45    Test: Hemoglobin  Critical Value: 5.9  Name of Provider Notified: Dr.Arrien   Orders Received? Or Actions Taken?: Awaiting orders, will continue to monitor

## 2020-11-13 NOTE — Progress Notes (Signed)
  Echocardiogram 2D Echocardiogram has been performed.  Shannon [name] 11/13/2020, 2:49 PM

## 2020-11-13 NOTE — Interval H&P Note (Signed)
History and Physical Interval Note:  11/13/2020 12:39 AM  Shannon [name]  has presented today for surgery, with the diagnosis of URINARY OBSTRUCTION.  The various methods of treatment have been discussed with the patient and family. After consideration of risks, benefits and other options for treatment, the patient has consented to  Procedure(s): CYSTOSCOPY, CLOT EVACU (N/A) as a surgical intervention.  The patient's history has been reviewed, patient examined, no change in status, stable for surgery.  I have reviewed the patient's chart and labs.  Questions were answered to the patient's satisfaction.     [name]

## 2020-11-13 NOTE — Op Note (Signed)
Operative Note  Preoperative diagnosis:  1.  Traumatic Foley 2.   Clot urinary retention  Postoperative diagnosis: 1.  Traumatic Foley 2.  Clot urinary retention  Procedure(s): 1.  Cystourethroscopy 2.  Evacuation of blood clot 3.  Fulguration  Surgeon: Kasandra Knudsen, MD  Assistants:  None  Anesthesia:  General  Complications:  None  EBL:  100 cc old clot  Specimens: 1. none  Drains/Catheters: 1.  22Fr 3-way foley with inflow port capped with catheter plug and outflow to gravity drainage  Intraoperative findings:   1. Normal anterior urethra 2. 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 Bilateral orthotopic ureteral orifices 4. Mild trabeculation 5. Moderate organized clot  Indication:  Shannon [name] is a 75 y.o. male with history of paraplegia and neurogenic bladder managed with indwelling Foley catheter.  Patient had traumatic Foley catheter placement and nondraining catheter at residential nursing facility.  Upon arrival in the emergency room patient had copious clots per urethra.  Description of procedure: After risks and benefits of the procedure were discussed with the patient in detail, informed consent was obtained.  The patient was taken to the operating room placed in supine position.  Anesthesia was induced and antibiotics were administered.  The patient was then repositioned in the dorsolithotomy position.  The patient was prepped and draped in usual sterile fashion a timeout is performed.  26 French rigid resectoscope was placed in the urethral meatus and advanced into the bladder.  Visualization was poor due to significant clot burden.  A Toomey syringe was then used to evacuate the clot through the resectoscope.  After all clot burden have been removed inspection of the bladder revealed bilateral orthotopic ureteral orifices.  Patient was noted to have trilobar prostatic hypertrophy with mild oozing at the right  prostatic/bladder neck.  This was fulgurated with a bipolar loop.  Hemostasis was adequate with the irrigant turned off.  The resectoscope was removed.  A 22 French three-way Foley catheter was then placed without difficulty.  The inflow port was capped and the outflow port was placed to gravity drainage.  The patient emerged from anesthesia and was transferred the PACU in stable condition.  Disposition: The patient will be observed in PACU.  He has a chronic indwelling Foley catheter and it will remain in place for 30 days.  He will be observed in the hospital at least overnight.

## 2020-11-14 ENCOUNTER — Encounter (HOSPITAL_COMMUNITY): Payer: Self-pay | Admitting: Urology

## 2020-11-14 DIAGNOSIS — D62 Acute posthemorrhagic anemia: Secondary | ICD-10-CM | POA: Diagnosis not present

## 2020-11-14 DIAGNOSIS — R310 Gross hematuria: Secondary | ICD-10-CM | POA: Diagnosis not present

## 2020-11-14 DIAGNOSIS — R578 Other shock: Secondary | ICD-10-CM | POA: Diagnosis not present

## 2020-11-14 DIAGNOSIS — Z978 Presence of other specified devices: Secondary | ICD-10-CM | POA: Diagnosis not present

## 2020-11-14 LAB — BASIC METABOLIC PANEL
Anion gap: 3 — ABNORMAL LOW (ref 5–15)
BUN: 27 mg/dL — ABNORMAL HIGH (ref 8–23)
CO2: 29 mmol/L (ref 22–32)
Calcium: 7.9 mg/dL — ABNORMAL LOW (ref 8.9–10.3)
Chloride: 107 mmol/L (ref 98–111)
Creatinine, Ser: 0.46 mg/dL — ABNORMAL LOW (ref 0.61–1.24)
GFR, Estimated: >60 mL/min (ref >60)
Glucose, Bld: 149 mg/dL — ABNORMAL HIGH (ref 70–99)
Potassium: 3.6 mmol/L (ref 3.5–5.1)
Sodium: 139 mmol/L (ref 135–145)

## 2020-11-14 LAB — CBC WITH DIFFERENTIAL/PLATELET
Abs Immature Granulocytes: 0.08 10*3/uL — ABNORMAL HIGH (ref 0.00–0.07)
Basophils Absolute: 0.1 10*3/uL (ref 0.0–0.1)
Basophils Relative: 1 %
Eosinophils Absolute: 0.6 10*3/uL — ABNORMAL HIGH (ref 0.0–0.5)
Eosinophils Relative: 6 %
HCT: 22.8 % — ABNORMAL LOW (ref 39.0–52.0)
Hemoglobin: 7.2 g/dL — ABNORMAL LOW (ref 13.0–17.0)
Immature Granulocytes: 1 %
Lymphocytes Relative: 13 %
Lymphs Abs: 1.3 10*3/uL (ref 0.7–4.0)
MCH: 26.6 pg (ref 26.0–34.0)
MCHC: 31.6 g/dL (ref 30.0–36.0)
MCV: 84.1 fL (ref 80.0–100.0)
Monocytes Absolute: 1.5 10*3/uL — ABNORMAL HIGH (ref 0.1–1.0)
Monocytes Relative: 15 %
Neutro Abs: 6.7 10*3/uL (ref 1.7–7.7)
Neutrophils Relative %: 64 %
Platelets: 130 10*3/uL — ABNORMAL LOW (ref 150–400)
RBC: 2.71 MIL/uL — ABNORMAL LOW (ref 4.22–5.81)
RDW: 16.4 % — ABNORMAL HIGH (ref 11.5–15.5)
WBC: 10.2 10*3/uL (ref 4.0–10.5)
nRBC: 0.4 % — ABNORMAL HIGH (ref 0.0–0.2)

## 2020-11-14 LAB — PREPARE RBC (CROSSMATCH)

## 2020-11-14 LAB — HEMOGLOBIN AND HEMATOCRIT, BLOOD
HCT: 27 % — ABNORMAL LOW (ref 39.0–52.0)
Hemoglobin: 8.6 g/dL — ABNORMAL LOW (ref 13.0–17.0)

## 2020-11-14 MED ORDER — LACTATED RINGERS IV SOLN: INTRAVENOUS | Status: DC

## 2020-11-14 MED ORDER — SODIUM CHLORIDE 0.9% IV SOLUTION: Freq: Once | INTRAVENOUS | Status: AC

## 2020-11-14 NOTE — Progress Notes (Signed)
[facility]) Hospitalized Hospice Patient  This patient isa current hospice patient with [facility],admitted to services on 10/27/20 with a terminal diagnosis of abnormal weight loss. Per ACC notes, on call was notified by [facility] nurse yesterday that the chronic indwelling Foley had been swapped out with a 16 Fr. coude with gross hematuria on return. Pt presented to [facility] for evaluation of gross hematuria. Per Dr. Gildardo Cranker, [facility] MD, this is a related admission.  Report exchanged. Visited patient at the bedside. He is alert and oriented and advised that he is feeling better. His spirits are good and he is thankful for the excellent care he is receiving. 1 unit of blood was finishing transfusing and he was scheduled for another unit. He denies pain or other needs that are not being met by the staff in ICU.  V/S: 99.4 axillary, 107/40, HR 93, RR 18, SPO2 97% on O2 via [place]  I&O: 2293/1700 Labs: RBC 2.71, hgb 7.2, HCT 22.8, plt 130 IVs/PRNs: levophed 3 mcg/min continuous for a MAP of 60, lactated ringers continuous @ 100 ml/hr, 2 units PRBCs for hgb < 8  Problem List:  Hemorrhagic shock due to massive hematuria/ chronic urinary retention - urine in foley is clear, receiving 2 units of PRBCs today, weaning off of levophed. He is alert and oriented today, back at his baseline. V/S as listed above.   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  Swallow dysfunction/ protein calorie malnutrition severe - tube feeds to be restarted, albumin 2.1, RD consult to assist with nutrition.   Goals of care: Ongoing. Pt recently admitted onto hospice services on 5/6, sent to ED for gross hematuria after foley exchange. Pt advises that he understands what hospice is but he would like to be treated for what can be treated.   D/C plan: Back to his facility with hospice support  once he is medically optimized.  Family: Updated.  IDT: Hospice team updated  Thank you, Venia Carbon RN, BSN, [facility] Liaison

## 2020-11-14 NOTE — TOC Initial Note (Signed)
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  Patient underwent Foley catheter placement earlier in the day on 5/22.  Upon attempting Foley catheter placement patient was not exhibiting any urine output.  When this Foley catheter was removed patient suddenly began to exhibit rapid gross hematuria.  Patient reports some associated abdominal pain but has not detailing his symptoms further.    EMS was contacted and the patient was promptly brought into [facility] emergency department for evaluation.  Upon evaluation in the emergency department patient was found to have ongoing gross hematuria.  CT imaging of the abdomen and pelvis revealed a moderate amount of blood in the bladder with bilateral subcentimeter nonobstructing renal calculi.  A 22 French three-way Foley catheter was then placed and continuous bladder irrigation was initiated.  Dr. Arita Miss with urology was then contacted who recommended manual bladder irrigation however patient was unable to tolerate this and therefore Dr. Arita Miss promptly came in and took the patient to the operating room for evacuation of clots and fulguration of the bladder. The hospitalist group has additionally been called to assess the patient for admission to the hospital. PLAN: to return to home hgb 7.2  rec'ing one unit of prbc this am.  Expected Discharge Plan: Home/Self Care Barriers to Discharge: Continued Medical Work up   Patient Goals and CMS Choice Patient states their goals for this hospitalization and ongoing recovery are:: to go back home CMS Medicare.gov Compare Post Acute Care list provided to:: Patient    Expected Discharge Plan and Services Expected Discharge Plan: Home/Self Care   Discharge Planning Services: CM Consult   Living arrangements for the past 2 months: Single Family Home                                      Prior Living Arrangements/Services Living arrangements for the past 2 months: Single Family Home Lives with:: Self Patient language and need for interpreter reviewed:: Yes Do you feel safe going back to the place where you live?: Yes      Need for Family Participation in Patient Care: Yes (Comment) (daughter) Care giver support system in place?: Yes (comment)   Criminal Activity/Legal Involvement Pertinent to Current Situation/Hospitalization: No - Comment as needed  Activities of Daily Living Home Assistive Devices/Equipment: None ADL Screening (condition at time of admission) Patient's cognitive ability adequate to safely complete daily activities?: No Is the patient deaf or have difficulty hearing?: No Does the patient have difficulty seeing, even when wearing glasses/contacts?: No Does the patient have difficulty concentrating, remembering, or making decisions?: Yes Patient able to express need for assistance with ADLs?: Yes Does the patient have difficulty dressing or bathing?: Yes Independently performs ADLs?: No Does the patient have difficulty walking or climbing stairs?:  Yes Weakness of Legs: Both Weakness of Arms/Hands: Both  Permission Sought/Granted                  Emotional Assessment Appearance:: Appears stated age Attitude/Demeanor/Rapport: Engaged Affect (typically observed): Calm Orientation: : Oriented  to Place,Oriented to Self,Oriented to  Time,Oriented to Situation Alcohol / Substance Use: Not Applicable Psych Involvement: No (comment)  Admission diagnosis:  Gross hematuria [R31.0] Anemia, unspecified type [D64.9] Hematuria, unspecified type [R31.9] Patient Active Problem List   Diagnosis Date Noted  . Stage IV pressure ulcer of sacral region (HCC) 11/13/2020  . Anemia 11/13/2020  . Hemorrhagic shock (HCC) 11/13/2020  . Gross hematuria 11/12/2020  . 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  . Hematuria 06/19/2020  . Sacral decubitus ulcer 06/19/2020  . COVID-19 virus infection 06/06/2020 06/19/2020  . Palliative care by specialist   . Goals of care, counseling/discussion   . Protein-calorie malnutrition, severe 05/05/2020  . Chronic indwelling Foley catheter 05/03/2020  . Quadriplegia and quadriparesis (HCC)   . Acute blood loss anemia   . Slow transit constipation   . Neuropathic pain   . Asthma   . 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   PCP:  [name], MD Pharmacy:  No Pharmacies Listed    Social Determinants of Health (SDOH) Interventions    Readmission Risk Interventions No flowsheet data found.

## 2020-11-14 NOTE — Progress Notes (Signed)
S: Patient remains in ICU.    O: Foley draining appropriately. Foreskin in anatomic position over glans. 22Fr 3 way foley with plug on inflow port and draining clear yellow urine  A/P:  Gross hematuria - resolved, No further urology intervention.  Urinary retention - continue foley, changes q 30 days

## 2020-11-14 NOTE — Progress Notes (Addendum)
PROGRESS NOTE    Shannon [name]  HER:[id number] DOB: 03/19/1946 DOA: 11/12/2020 PCP: [name], MD    Brief Narrative:  Shannon [name] was admitted to the hospital with severe hematuria, due to foley trauma complicated with hemorrhagic shock.   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  Patient underwent Foley catheter exchange 5/22, developing severe hematuria and abdominal pain.  On his initial physical examination his blood pressure was 74/43, heart rate 107, respiratory rate 14, temperature 97.8, oxygen saturation 99%.  His lungs had bibasilar rales, heart S1-S2, present, rhythmic, his abdomen had PEG tube in place, positive bowel sounds, no lower extremity edema.  Large stage IV decubitus ulcer.   Sodium 135, potassium 3.9, chloride 102, bicarb 25, glucose 175, BUN 35, creatinine 0.65, white count 7.7, hemoglobin 7.1, hematocrit 26.6, platelets 264. SARS COVID-19 negative.  Urinalysis with frank hematuria, > 50 white cells.  Renal CT with moderate amount of acute blood products within the urinary bladder lumen, likely secondary to sequela  associated with recent Foley catheter placement.  Bilateral subcentimeter nonobstructing renal calculi.  Cholelithiasis.  EKG 160 bpm, normal axis, normal intervals, sinus rhythm, positive LVH, poor R wave progression, no significant ST segment or T wave changes, noisy baseline.  In the emergency department patient had a 22 French three-way Foley catheter placed for continuous bladder irrigation.  Patient unable to tolerate bladder irrigation and was taken to the operating room by Dr. Arita Miss.  Patient had cystoscopy, ureteroscopy, evacuation of blood clot and  fulguration.  Persistent hypotension required vasopressors and transferred to the intensive care unit.  SP 4 units PRBC transfusion. Blood pressure improved and weaning off norepinephrine infusion.    Assessment & Plan:   Principal Problem:   Hemorrhagic shock (HCC) Active Problems:   Quadriplegia and quadriparesis (HCC)   Acute blood loss anemia   Chronic indwelling Foley catheter   Protein-calorie malnutrition, severe   Gross hematuria   Stage IV pressure ulcer of sacral region (HCC)   Anemia    1. Hemorrhagic shock due to massive hematuria/ chronic urinary retention. Continue with no macroscopic hematuria in the foley bag.  Sp 4 units PRBC transfusion.  This am with norepinephrine drip at 4 mcg per min with improved blood pressure, systolic has been in the low 100's  His mentation has improved, urine output is 1,700 ml over last 24 hrs.  Follow up Hgb is 7,2 with hct 22,3 Echocardiogram with preserved LV and RV systolic function.   Plan to transfuse 2 units more of PRBC today.  Continue to wean off norepinephrine drip to MAP of 60 (quadriplegia).  Continue with midodrine 10 mg tid.  Follow H&H after PRBC transfusion, target Hgb of 8 or greater.   2. 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 Local wound care. Admission pictures with no apparent purulence at the site.  Continue pregabalin and frequent turning.  Follow with wound care recommendations   3. Swallow dysfunction/ protein calorie malnutrition severe. Consult nutrition for tube feedings. Continue with aspiration precautions. Patient is more awake and reactive this am.   4. Depression. On duloxetine.   5. Iron deficiency. Enteral iron supplementation    Patient continue to be at high risk for worsening shock Patient critically ill with shock on vasopressors to prevent worsening hypotension.  Critical care time 60 minutes.   Status is: Inpatient  Remains inpatient  appropriate because:IV treatments appropriate due to intensity of illness or inability to take PO   Dispo: The patient is from: SNF              Anticipated d/c is to: SNF              Patient currently is not medically stable to d/c.   Difficult to place patient No  DVT prophylaxis: scd   Code Status:   full  Family Communication:  No family at the bedside      Nutrition Status: Nutrition Problem: Severe Malnutrition Etiology: chronic illness (wounds) Signs/Symptoms: percent weight loss,severe fat depletion,severe muscle depletion Interventions: Tube feeding,Prostat,Juven    Consultants:   Urology   Procedures:  1. Cystourethroscopy 2.Evacuation of blood clot 3.Fulguration   Subjective: Patient is more awake and alert, no nausea or vomiting, no chest pain or dyspnea, no further bleeding into this foley catheter bag.   Objective: Vitals:   11/14/20 0645 11/14/20 0700 11/14/20 0752 11/14/20 0800  BP: (!) 112/41 (!) 117/46  (!) 111/41  Pulse: 80 80  81  Resp: 12 13  14   Temp:   98 F (36.7 C) 98 F (36.7 C)  TempSrc:   Oral Oral  SpO2: 98% 100%  98%  Weight:      Height:        Intake/Output Summary (Last 24 hours) at 11/14/2020 0831 Last data filed at 11/14/2020 0459 Gross per 24 hour  Intake 1978.32 ml  Output 1700 ml  Net 278.32 ml   Filed Weights   11/13/20 0247  Weight: 89.4 kg    Examination:   General: Not in pain or dyspnea, deconditioned  Neurology: Awake and alert, non focal  E ENT: mild pallor, no icterus, oral mucosa moist Cardiovascular: No JVD. S1-S2 present, rhythmic, no gallops, rubs, or murmurs. Positive bilateral non pitting lower extremity edema. Pulmonary: positive breath sounds bilaterally, with no wheezing, rhonchi or rales. Gastrointestinal. Abdomen soft and non tender, peg tube in place Skin. Large sacral decub with dressing in place, Musculoskeletal: no joint deformities     Data Reviewed: I have personally  reviewed following labs and imaging studies  CBC: Recent Labs  Lab 11/12/20 2000 11/13/20 1030 11/13/20 1123 11/13/20 2100  WBC 7.7 11.4*  --   --   NEUTROABS 6.2 7.7  --   --   HGB 7.1* 5.5* 5.9* 7.2*  HCT 26.6* 17.7* 19.0* 22.3*  MCV 80.6 81.6  --   --   PLT 264 164  --   --    Basic Metabolic Panel: Recent Labs  Lab 11/12/20 2000 11/13/20 1030  NA 135 136  K 3.9 3.8  CL 102 107  CO2 25 26  GLUCOSE 175* 144*  BUN 35* 29*  CREATININE 0.65 0.57*  CALCIUM 8.6* 7.9*   GFR: Estimated Creatinine Clearance: 86.4 mL/min (A) (by C-G formula based on SCr of 0.57 mg/dL (L)). Liver Function Tests: Recent Labs  Lab 11/12/20 2000  AST 24  ALT 10  ALKPHOS 113  BILITOT 0.5  PROT 7.9  ALBUMIN 2.1*   No results for input(s): LIPASE, AMYLASE in the last 168 hours. No results for input(s): AMMONIA in the last 168 hours. Coagulation Profile: Recent Labs  Lab 11/12/20 2000  INR 1.2   Cardiac Enzymes: No results for input(s): CKTOTAL, CKMB, CKMBINDEX, TROPONINI in the last 168 hours. BNP (last 3 results) No results for  input(s): PROBNP in the last 8760 hours. HbA1C: No results for input(s): HGBA1C in the last 72 hours. CBG: No results for input(s): GLUCAP in the last 168 hours. Lipid Profile: No results for input(s): CHOL, HDL, LDLCALC, TRIG, CHOLHDL, LDLDIRECT in the last 72 hours. Thyroid Function Tests: No results for input(s): TSH, T4TOTAL, FREET4, T3FREE, THYROIDAB in the last 72 hours. Anemia Panel: No results for input(s): VITAMINB12, FOLATE, FERRITIN, TIBC, IRON, RETICCTPCT in the last 72 hours.    Radiology Studies: I have reviewed all of the imaging during this hospital visit personally     Scheduled Meds: . acetaminophen  1,000 mg Oral BID  . acidophilus  1 capsule Oral Daily  . albuterol  2 puff Inhalation Daily  . Chlorhexidine Gluconate Cloth  6 each Topical Daily  . docusate  100 mg Per Tube BID  . DULoxetine  30 mg Oral Daily  . feeding  supplement (OSMOLITE 1.5 CAL)  1,000 mL Per Tube Q24H  . feeding supplement (PROSource TF)  45 mL Per Tube BID  . ferrous sulfate  300 mg Per Tube TID  . mouth rinse  15 mL Mouth Rinse BID  . midodrine  10 mg Per Tube TID  . mupirocin ointment  1 application Nasal BID  . nutrition supplement (JUVEN)  1 packet Per Tube BID BM  . pantoprazole sodium  40 mg Per Tube Daily  . polyethylene glycol  17 g Per Tube Daily  . pregabalin  100 mg Oral BID  . [START ON 11/15/2020] scopolamine  1 patch Transdermal Q72H  . sodium hypochlorite   Irrigation Daily  . tamsulosin  0.4 mg Oral Daily   Continuous Infusions: . sodium chloride    . norepinephrine (LEVOPHED) Adult infusion 4 mcg/min (11/14/20 0547)     LOS: 2 days        Braxdon Gappa Annett Gula, MD

## 2020-11-14 NOTE — Progress Notes (Addendum)
NUTRITION NOTE  Consult received for initiation and management of TF. Patient was seen for full assessment by another RD yesterday and dx with severe malnutrition related to chronic illness as evidenced by percent weight loss, severe fat depletions, and severe muscle depletions.   Patient has a PEG and order is in place for Osmolite 1.5 @ 60 ml/hr with 45 ml Prosource TF BID, and 1 packet Juven BID. This regimen is providing 2430 kcal, 117 grams protein, and 1097 ml free water.   He is receiving IV fluids @ 100 ml/hr (2400 ml/24 hrs). Will monitor for need for free water flush with IV fluid changes.   RD will continue to follow per protocol.      Trenton Gammon, MS, RD, LDN, CNSC Inpatient Clinical Dietitian RD pager # available in [facility]  After hours/weekend pager # available in [facility]

## 2020-11-15 DIAGNOSIS — R310 Gross hematuria: Secondary | ICD-10-CM | POA: Diagnosis not present

## 2020-11-15 DIAGNOSIS — D62 Acute posthemorrhagic anemia: Secondary | ICD-10-CM | POA: Diagnosis not present

## 2020-11-15 DIAGNOSIS — R578 Other shock: Secondary | ICD-10-CM | POA: Diagnosis not present

## 2020-11-15 DIAGNOSIS — I10 Essential (primary) hypertension: Secondary | ICD-10-CM | POA: Diagnosis not present

## 2020-11-15 LAB — TYPE AND SCREEN
ABO/RH(D): A POS
Antibody Screen: NEGATIVE
Unit division: 0
Unit division: 0
Unit division: 0
Unit division: 0
Unit division: 0
Unit division: 0

## 2020-11-15 LAB — URINE CULTURE: Culture: 20000 — AB

## 2020-11-15 LAB — BPAM RBC
Blood Product Expiration Date: 202206112359
Blood Product Expiration Date: 202206112359
Blood Product Expiration Date: 202206122359
Blood Product Expiration Date: 202206152359
Blood Product Expiration Date: 202206172359
Blood Product Expiration Date: 202206182359
ISSUE DATE / TIME: 202205230305
ISSUE DATE / TIME: 202205230550
ISSUE DATE / TIME: 202205231235
ISSUE DATE / TIME: 202205231450
ISSUE DATE / TIME: 202205241058
ISSUE DATE / TIME: 202205241401
Unit Type and Rh: 6200
Unit Type and Rh: 6200
Unit Type and Rh: 6200
Unit Type and Rh: 6200
Unit Type and Rh: 6200
Unit Type and Rh: 6200

## 2020-11-15 LAB — CBC WITH DIFFERENTIAL/PLATELET
Abs Immature Granulocytes: 0.04 10*3/uL (ref 0.00–0.07)
Basophils Absolute: 0 10*3/uL (ref 0.0–0.1)
Basophils Relative: 0 %
Eosinophils Absolute: 0.6 10*3/uL — ABNORMAL HIGH (ref 0.0–0.5)
Eosinophils Relative: 7 %
HCT: 27 % — ABNORMAL LOW (ref 39.0–52.0)
Hemoglobin: 8.5 g/dL — ABNORMAL LOW (ref 13.0–17.0)
Immature Granulocytes: 0 %
Lymphocytes Relative: 17 %
Lymphs Abs: 1.5 10*3/uL (ref 0.7–4.0)
MCH: 27.2 pg (ref 26.0–34.0)
MCHC: 31.5 g/dL (ref 30.0–36.0)
MCV: 86.3 fL (ref 80.0–100.0)
Monocytes Absolute: 1.3 10*3/uL — ABNORMAL HIGH (ref 0.1–1.0)
Monocytes Relative: 15 %
Neutro Abs: 5.4 10*3/uL (ref 1.7–7.7)
Neutrophils Relative %: 61 %
Platelets: 128 10*3/uL — ABNORMAL LOW (ref 150–400)
RBC: 3.13 MIL/uL — ABNORMAL LOW (ref 4.22–5.81)
RDW: 17 % — ABNORMAL HIGH (ref 11.5–15.5)
WBC: 8.9 10*3/uL (ref 4.0–10.5)
nRBC: 0 % (ref 0.0–0.2)

## 2020-11-15 LAB — BASIC METABOLIC PANEL
Anion gap: 3 — ABNORMAL LOW (ref 5–15)
BUN: 29 mg/dL — ABNORMAL HIGH (ref 8–23)
CO2: 30 mmol/L (ref 22–32)
Calcium: 8.1 mg/dL — ABNORMAL LOW (ref 8.9–10.3)
Chloride: 106 mmol/L (ref 98–111)
Creatinine, Ser: 0.47 mg/dL — ABNORMAL LOW (ref 0.61–1.24)
GFR, Estimated: >60 mL/min (ref >60)
Glucose, Bld: 126 mg/dL — ABNORMAL HIGH (ref 70–99)
Potassium: 3.6 mmol/L (ref 3.5–5.1)
Sodium: 139 mmol/L (ref 135–145)

## 2020-11-15 MED ORDER — SODIUM CHLORIDE 0.9 % IV SOLN: INTRAVENOUS | Status: DC

## 2020-11-15 MED ORDER — ACETAMINOPHEN 160 MG/5ML PO SOLN
1000.0000 mg | Freq: Two times a day (BID) | ORAL | Status: DC
  Administered 2020-11-15 – 2020-11-18 (×6): 1000 mg
  Filled 2020-11-15 (×6): qty 40.6

## 2020-11-15 MED ORDER — PREGABALIN 100 MG PO CAPS
100.0000 mg | ORAL_CAPSULE | Freq: Two times a day (BID) | ORAL | Status: DC
  Administered 2020-11-15 – 2020-11-18 (×6): 100 mg
  Filled 2020-11-15 (×6): qty 1

## 2020-11-15 NOTE — Progress Notes (Signed)
Shannon [name] [address] [facility]) Hospitalized Hospice Patient  This patient isa current hospice patient with [facility],admitted to services on 10/27/20 with a terminal diagnosis of abnormal weight loss. Per ACC notes, on call was notified by [facility] nurse yesterday that the chronic indwelling Foley had been swapped out with a 16 Fr. coude with gross hematuria on return. Pt presented to [facility] for evaluation of gross hematuria. Per Dr. Kirt Boys, [facility] MD, this is a related admission.  Report received from bedside RN. Pt has been weaned off of vasporessors and is stable. Visited patient at the bedside, he was asleep and I did not wake him.  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   Goals of care: Ongoing. Pt recently admitted onto hospice services on 5/6, sent to ED for gross hematuria after foley exchange. Pt advises that he understands what hospice is but he would like to be treated for what can be treated.   D/C plan: Back to his facility with hospice support once he is medically optimized.  Family: Updated.  IDT: Hospice team updated  Thank you, Wallis Bamberg RN, BSN, CCRN [facility] Liaison

## 2020-11-15 NOTE — Progress Notes (Signed)
PROGRESS NOTE    Shannon [name]  FYB:[id number] DOB: 1945/08/28 DOA: 11/12/2020 PCP: [name], MD (Confirm with patient/family/NH records and if not entered, this HAS to be entered at [facility] point of entry. "No PCP" if truly none.)   Chief Complaint  Patient presents with  . Hematuria  . Catheter Issue    Brief Narrative:  Shannon [name] was admitted to the hospital with severe hematuria, due to foley trauma complicated with hemorrhagic shock.  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 Patient underwent Foley catheter exchange 5/22, developing severe hematuria and abdominal pain. On his initial physical examination his blood pressure was 74/43, heart rate 107, respiratory rate 14, temperature 97.8, oxygen saturation 99%. His lungs had bibasilar rales, heart S1-S2, present, rhythmic, his abdomen had PEG tube in place, positive bowel sounds, no lower extremity edema.Large stage IV decubitus ulcer.  Sodium 135, potassium 3.9, chloride 102, bicarb 25, glucose 175, BUN 35, creatinine 0.65, white count 7.7, hemoglobin 7.1, hematocrit 26.6, platelets 264. SARS COVID-19 negative.  Urinalysis with frank hematuria,>50 white cells.  Renal CT with moderate amount of acute blood products within the urinary bladder lumen, likely secondary to sequela  associated with recent Foley catheter placement. Bilateral subcentimeter nonobstructing renal calculi. Cholelithiasis.  EKG 160 bpm, normal axis, normal intervals, sinus rhythm, positive LVH, poor R wave progression, no significant ST segment or T wave changes, noisy baseline.  In the emergency department patient had a 22 French three-way Foley catheter placed for continuous  bladder irrigation. Patient unable to tolerate bladder irrigation and was taken to the operating room by Dr. Arita Miss.  Patient had cystoscopy, ureteroscopy, evacuation of blood clot and fulguration.  Persistent hypotension required vasopressors and transferred to the intensive care unit.  SP 6 units PRBC transfusion. Blood pressure improved and weaning off norepinephrine infusion.     Assessment & Plan:   Principal Problem:   Hemorrhagic shock (HCC) Active Problems:   Quadriplegia and quadriparesis (HCC)   Acute blood loss anemia   Chronic indwelling Foley catheter   Protein-calorie malnutrition, severe   Gross hematuria   Stage IV pressure ulcer of sacral region (HCC)   Anemia  #1 hemorrhagic shock secondary to massive hematuria/chronic urinary retention -Patient had presented with a hemorrhagic shock secondary to massive hematuria noted. -Patient was on pressors which have been weaned off with systolic blood pressures in the low 100s. -Status post transfusion 6 units packed red blood cells.- -Urine output of 1.850 L over the past 24 hours. -Currently on midodrine. -2D echo with preserved EF. -Hemoglobin currently stable at 8.5. -Follow H&H.  2.  Quadriplegia complicated by large stage IV sacral decubitus ulcer -Patient seen by wound care RN. -No purulence or fluctuation noted at site. -Continue current wound care. -Frequent turning.  3.  Swallow dysfunction/severe protein calorie malnutrition -Continue current tube feeds. -SLP evaluation.  4.  Depression -Duloxetine.  5.  Iron deficiency anemia -H&H currently stable. -Outpatient follow-up.  6.  Pressure Injury 06/20/20 Vertebral column Lower;Medial Stage 2 -  Partial thickness loss of dermis presenting as a shallow open injury with a red, pink wound bed without slough. (Active)  06/20/20 0130  Location: Vertebral column  Location Orientation: Lower;Medial  Staging: Stage 2 -  Partial thickness loss of  dermis presenting as a shallow open injury  with a red, pink wound bed without slough.  Wound Description (Comments):   Present on Admission: Yes     Pressure Injury 06/22/20 Sacrum Posterior Stage 4 - Full thickness tissue loss with exposed bone, tendon or muscle. PHYSICAL THERAPY/HYDROTHERAPY (Active)  06/22/20 1400  Location: Sacrum  Location Orientation: Posterior  Staging: Stage 4 - Full thickness tissue loss with exposed bone, tendon or muscle.  Wound Description (Comments): PHYSICAL THERAPY/HYDROTHERAPY  Present on Admission: Yes         DVT prophylaxis: SCDs Code Status: Full Family Communication: Updated patient.  No family at bedside. Disposition:   Status is: Inpatient    Dispo: The patient is from: SNF              Anticipated d/c is to: SNF with hospice follow              Patient currently not stable for discharge   Difficult to place patient: No       Consultants:   Urology: Dr. Arita Miss 11/12/2020  Wound care: Janit Pagan, RN 11/13/2020  Procedures:   CT renal stone protocol 11/12/2020  2D echo 11/13/2020  Cystourethroscopy, evacuation of blood clot, fulguration per Dr. Arita Miss 11/13/2020  Transfusion of 6 units PRBCs 11/13/2020-11/14/2020  Antimicrobials:  None.   Subjective: Sleeping but arousable.  Denies any chest pain.  No shortness of breath.  No abdominal pain.  Feeling better than on admission.  Patient currently off pressors.  Objective: Vitals:   11/15/20 0500 11/15/20 0600 11/15/20 0800 11/15/20 0845  BP: (!) 103/44 (!) 112/53    Pulse: 65 62    Resp: 11 10    Temp:   (!) 97.2 F (36.2 C)   TempSrc:   Oral   SpO2: 100% 100%  100%  Weight:      Height:        Intake/Output Summary (Last 24 hours) at 11/15/2020 1022 Last data filed at 11/15/2020 0600 Gross per 24 hour  Intake 896.88 ml  Output 1850 ml  Net -953.12 ml   Filed Weights   11/13/20 0247  Weight: 89.4 kg    Examination:  General exam: Appears calm and  comfortable.  Dry mucous membranes Respiratory system: Clear to auscultation anterior lung fields.  No wheezes, no crackles, no rhonchi.Marland Kitchen Respiratory effort normal. Cardiovascular system: S1 & S2 heard, RRR. No JVD, murmurs, rubs, gallops or clicks. No pedal edema. Gastrointestinal system: Abdomen is nondistended, soft and nontender. No organomegaly or masses felt. Normal bowel sounds heard.  G-tube intact. Central nervous system: Alert and oriented.  Quadriplegia.   Extremities: Symmetric 5 x 5 power. Skin: No rashes, lesions or ulcers Psychiatry: Judgement and insight appear normal. Mood & affect appropriate.     Data Reviewed: I have personally reviewed following labs and imaging studies  CBC: Recent Labs  Lab 11/12/20 2000 11/13/20 1030 11/13/20 1123 11/13/20 2100 11/14/20 0900 11/14/20 1845 11/15/20 0236  WBC 7.7 11.4*  --   --  10.2  --  8.9  NEUTROABS 6.2 7.7  --   --  6.7  --  5.4  HGB 7.1* 5.5* 5.9* 7.2* 7.2* 8.6* 8.5*  HCT 26.6* 17.7* 19.0* 22.3* 22.8* 27.0* 27.0*  MCV 80.6 81.6  --   --  84.1  --  86.3  PLT 264 164  --   --  130*  --  128*    Basic Metabolic Panel: Recent Labs  Lab 11/12/20 2000 11/13/20 1030 11/14/20 0900 11/15/20 0236  NA 135 136  139 139  K 3.9 3.8 3.6 3.6  CL 102 107 107 106  CO2 25 26 29 30   GLUCOSE 175* 144* 149* 126*  BUN 35* 29* 27* 29*  CREATININE 0.65 0.57* 0.46* 0.47*  CALCIUM 8.6* 7.9* 7.9* 8.1*    GFR: Estimated Creatinine Clearance: 86.4 mL/min (A) (by C-G formula based on SCr of 0.47 mg/dL (L)).  Liver Function Tests: Recent Labs  Lab 11/12/20 2000  AST 24  ALT 10  ALKPHOS 113  BILITOT 0.5  PROT 7.9  ALBUMIN 2.1*    CBG: No results for input(s): GLUCAP in the last 168 hours.   Recent Results (from the past 240 hour(s))  Urine Culture     Status: Abnormal   Collection Time: 11/12/20  8:00 PM   Specimen: Urine, Random  Result Value Ref Range Status   Specimen Description   Final    URINE,  RANDOM Performed at [facility], [address], [place], [place]    Special Requests   Final    NONE Performed at [facility], [address] [place], [place]    Culture 20,000 COLONIES/mL PSEUDOMONAS AERUGINOSA (A)  Final   Report Status 11/15/2020 FINAL  Final   Organism ID, Bacteria PSEUDOMONAS AERUGINOSA (A)  Final      Susceptibility   Pseudomonas aeruginosa - MIC*    CEFTAZIDIME 4 SENSITIVE Sensitive     CIPROFLOXACIN <=0.25 SENSITIVE Sensitive     GENTAMICIN <=1 SENSITIVE Sensitive     IMIPENEM 2 SENSITIVE Sensitive     PIP/TAZO 8 SENSITIVE Sensitive     CEFEPIME 2 SENSITIVE Sensitive     * 20,000 COLONIES/mL PSEUDOMONAS AERUGINOSA  Resp Panel by RT-PCR (Flu A&B, Covid) Nasopharyngeal Swab     Status: None   Collection Time: 11/12/20 11:23 PM   Specimen: Nasopharyngeal Swab; Nasopharyngeal(NP) swabs in vial transport medium  Result Value Ref Range Status   SARS Coronavirus 2 by RT PCR NEGATIVE NEGATIVE Final    Comment: (NOTE) SARS-CoV-2 target nucleic acids are NOT DETECTED.  The SARS-CoV-2 RNA is generally detectable in upper respiratory specimens during the acute phase of infection. The lowest concentration of SARS-CoV-2 viral copies this assay can detect is 138 copies/mL. A negative result does not preclude SARS-Cov-2 infection and should not be used as the sole basis for treatment or other patient management decisions. A negative result may occur with  improper specimen collection/handling, submission of specimen other than nasopharyngeal swab, presence of viral mutation(s) within the areas targeted by this assay, and inadequate number of viral copies(<138 copies/mL). A negative result must be combined with clinical observations, patient history, and epidemiological information. The expected result is Negative.  Fact Sheet for Patients:  11/14/20  Fact Sheet for  Healthcare Providers:  BloggerCourse.com  This test is no t yet approved or cleared by the [employer] FDA and  has been authorized for detection and/or diagnosis of SARS-CoV-2 by FDA under an Emergency Use Authorization (EUA). This EUA will remain  in effect (meaning this test can be used) for the duration of the COVID-19 declaration under Section 564(b)(1) of the Act, 21 U.S.C.section 360bbb-3(b)(1), unless the authorization is terminated  or revoked sooner.       Influenza A by PCR NEGATIVE NEGATIVE Final   Influenza B by PCR NEGATIVE NEGATIVE Final    Comment: (NOTE) The Xpert Xpress SARS-CoV-2/FLU/RSV plus assay is intended as an aid in the diagnosis of influenza from Nasopharyngeal swab specimens and should  not be used as a sole basis for treatment. Nasal washings and aspirates are unacceptable for Xpert Xpress SARS-CoV-2/FLU/RSV testing.  Fact Sheet for Patients: BloggerCourse.com  Fact Sheet for Healthcare Providers: SeriousBroker.it  This test is not yet approved or cleared by the [place] FDA and has been authorized for detection and/or diagnosis of SARS-CoV-2 by FDA under an Emergency Use Authorization (EUA). This EUA will remain in effect (meaning this test can be used) for the duration of the COVID-19 declaration under Section 564(b)(1) of the Act, 21 U.S.C. section 360bbb-3(b)(1), unless the authorization is terminated or revoked.  Performed at [facility], [address], [place], [place] [address]   MRSA PCR Screening     Status: Abnormal   Collection Time: 11/13/20  2:10 AM   Specimen: Nasal Mucosa; Nasopharyngeal  Result Value Ref Range Status   MRSA by PCR POSITIVE (A) NEGATIVE Final    Comment:        The GeneXpert MRSA Assay (FDA approved for NASAL specimens only), is one component of a comprehensive MRSA colonization surveillance program. It is  not intended to diagnose MRSA infection nor to guide or monitor treatment for MRSA infections. RESULT CALLED TO, READ BACK BY AND VERIFIED WITH: STACEY WEAVER AT 0407 ON 11/13/20 BY Katha Hamming Performed at [facility], [address] [place], [place] [address]          Radiology Studies: ECHOCARDIOGRAM COMPLETE  Result Date: 11/13/2020    ECHOCARDIOGRAM REPORT   Patient Name:   [name] Date of Exam: 11/13/2020 Medical Rec #:  [id number]        Height:       67.0 in Accession #:    [id number]       Weight:       197.1 lb Date of Birth:  1945-11-30       BSA:          2.010 m Patient Age:    74 years         BP:           125/47 mmHg Patient Gender: M                HR:           78 bpm. Exam Location:  Inpatient Procedure: 2D Echo, Cardiac Doppler and Color Doppler Indications:    Hypotension  History:        Patient has no prior history of Echocardiogram examinations.                 Quadriplegia. Edema.  Sonographer:    Sheralyn Boatman RDCS Referring Phys: [id number] [facility] ARRIEN  Sonographer Comments: Technically difficult study due to poor echo windows. Suboptimal machine. Feeding tube in subcostal window. IMPRESSIONS  1. Left ventricular ejection fraction, by estimation, is 60 to 65%. The left ventricle has normal function. The left ventricle has no regional wall motion abnormalities. There is moderate left ventricular hypertrophy. Left ventricular diastolic parameters are consistent with Grade I diastolic dysfunction (impaired relaxation).  2. Right ventricular systolic function is normal. The right ventricular size is normal. Tricuspid regurgitation signal is inadequate for assessing PA pressure.  3. The mitral valve is normal in structure. Trivial mitral valve regurgitation. No evidence of mitral stenosis.  4. The aortic valve is tricuspid. Aortic valve regurgitation is not visualized. Mild to moderate aortic valve sclerosis/calcification is present, without any  evidence of aortic stenosis.  5. Aortic dilatation noted. There is  moderate dilatation of the ascending aorta, measuring 46 mm.  6. IVC not visualized. FINDINGS  Left Ventricle: Left ventricular ejection fraction, by estimation, is 60 to 65%. The left ventricle has normal function. The left ventricle has no regional wall motion abnormalities. The left ventricular internal cavity size was normal in size. There is  moderate left ventricular hypertrophy. Left ventricular diastolic parameters are consistent with Grade I diastolic dysfunction (impaired relaxation). Right Ventricle: The right ventricular size is normal. No increase in right ventricular wall thickness. Right ventricular systolic function is normal. Tricuspid regurgitation signal is inadequate for assessing PA pressure. Left Atrium: Left atrial size was normal in size. Right Atrium: Right atrial size was normal in size. Pericardium: There is no evidence of pericardial effusion. Mitral Valve: The mitral valve is normal in structure. There is mild calcification of the mitral valve leaflet(s). Trivial mitral valve regurgitation. No evidence of mitral valve stenosis. Tricuspid Valve: The tricuspid valve is normal in structure. Tricuspid valve regurgitation is trivial. Aortic Valve: The aortic valve is tricuspid. Aortic valve regurgitation is not visualized. Mild to moderate aortic valve sclerosis/calcification is present, without any evidence of aortic stenosis. Pulmonic Valve: The pulmonic valve was normal in structure. Pulmonic valve regurgitation is trivial. Aorta: Aortic dilatation noted. There is moderate dilatation of the ascending aorta, measuring 46 mm. Venous: The inferior vena cava was not well visualized. IAS/Shunts: No atrial level shunt detected by color flow Doppler.  LEFT VENTRICLE PLAX 2D LVIDd:         4.52 cm      Diastology LVIDs:         2.59 cm      LV e' medial:    0.07 cm/s LV PW:         1.67 cm      LV E/e' medial:  13.2 LV IVS:         1.64 cm      LV e' lateral:   0.07 cm/s LVOT diam:     2.20 cm      LV E/e' lateral: 14.7 LV SV:         82 LV SV Index:   41 LVOT Area:     3.80 cm  LV Volumes (MOD) LV vol d, MOD A2C: 118.0 ml LV vol d, MOD A4C: 104.0 ml LV vol s, MOD A2C: 48.0 ml LV vol s, MOD A4C: 47.2 ml LV SV MOD A2C:     70.0 ml LV SV MOD A4C:     104.0 ml LV SV MOD BP:      65.4 ml RIGHT VENTRICLE RV S prime:     16.50 cm/s TAPSE (M-mode): 2.7 cm LEFT ATRIUM             Index       RIGHT ATRIUM           Index LA diam:        3.80 cm 1.89 cm/m  RA Area:     12.70 cm LA Vol (A2C):   48.9 ml 24.33 ml/m RA Volume:   25.00 ml  12.44 ml/m LA Vol (A4C):   46.4 ml 23.09 ml/m LA Biplane Vol: 48.0 ml 23.88 ml/m  AORTIC VALVE LVOT Vmax:   121.00 cm/s LVOT Vmean:  84.300 cm/s LVOT VTI:    0.216 m  AORTA Ao Root diam: 4.10 cm Ao Asc diam:  4.30 cm MITRAL VALVE MV Area (PHT): 3.91 cm    SHUNTS MV Decel Time: 194 msec  Systemic VTI:  0.22 m MV E velocity: 0.96 cm/s   Systemic Diam: 2.20 cm MV A velocity: 93.10 cm/s MV E/A ratio:  0.01 Marca Anconaalton Mclean MD Electronically signed by Marca Anconaalton Mclean MD Signature Date/Time: 11/13/2020/8:23:01 PM    Final         Scheduled Meds: . acetaminophen  1,000 mg Oral BID  . acidophilus  1 capsule Oral Daily  . albuterol  2 puff Inhalation Daily  . Chlorhexidine Gluconate Cloth  6 each Topical Daily  . docusate  100 mg Per Tube BID  . DULoxetine  30 mg Oral Daily  . feeding supplement (OSMOLITE 1.5 CAL)  1,000 mL Per Tube Q24H  . feeding supplement (PROSource TF)  45 mL Per Tube BID  . ferrous sulfate  300 mg Per Tube TID  . mouth rinse  15 mL Mouth Rinse BID  . midodrine  10 mg Per Tube TID  . mupirocin ointment  1 application Nasal BID  . nutrition supplement (JUVEN)  1 packet Per Tube BID BM  . pantoprazole sodium  40 mg Per Tube Daily  . polyethylene glycol  17 g Per Tube Daily  . pregabalin  100 mg Oral BID  . scopolamine  1 patch Transdermal Q72H  . sodium hypochlorite   Irrigation  Daily  . tamsulosin  0.4 mg Oral Daily   Continuous Infusions: . sodium chloride    . norepinephrine (LEVOPHED) Adult infusion Stopped (11/14/20 1548)     LOS: 3 days    Time spent: 35 minutes    Ramiro Harvestaniel Syble Picco, MD [facility]   To contact the attending provider between 7A-7P or the covering provider during after hours 7P-7A, please log into the web site [email or web address] and access using universal [place] password for that web site. If you do not have the password, please call the hospital operator.  11/15/2020, 10:22 AM

## 2020-11-15 NOTE — Evaluation (Signed)
Clinical/Bedside Swallow Evaluation Patient Details  Name: Shannon [name] MRN: [id number] Date of Birth: 05/31/1946  Today's Date: 11/15/2020 Time: SLP Start Time (ACUTE ONLY): 1615 SLP Stop Time (ACUTE ONLY): 1645 SLP Time Calculation (min) (ACUTE ONLY): 30 min  Past Medical History:  Past Medical History:  Diagnosis Date  . Asthma   . 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 Newell Coral, 1992-->surg at [facility].  Summer 2021->Cervicalgia,arm pain, hand atrophy RUE wkness, hyperreflex (Dr. 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 Hay fever   . Hypertension   . Osteoarthritis of both hands    Past Surgical History:  Past Surgical History:  Procedure Laterality Date  . ANTERIOR CERVICAL DECOMP/DISCECTOMY FUSION N/A 04/05/2020   Procedure: ANTERIOR CERVICAL DECOMPRESSION/DISCECTOMY FUSION, INTERBODY PROSTHESIS, PLATE/SCREWS CERVICAL THREE-CERVICAL FOUR, CERVICAL FOUR- CERVICAL FIVE;  Surgeon: Tressie Stalker, MD;  Location: [facility] OR;  Service: Neurosurgery;  Laterality: N/A;  . ANTERIOR CERVICAL DECOMP/DISCECTOMY FUSION N/A 04/22/2020   Procedure: Reexploration of anterior cervical wound for epidural hematoma;  Surgeon: Donalee Citrin, MD;  Location: [facility] OR;  Service: Neurosurgery;  Laterality: N/A;  . BACK SURGERY    . CYSTOSCOPY N/A 11/13/2020   Procedure: CYSTOSCOPY, CLOT EVACUATION, FULGURATION;  Surgeon: Noel Christmas, MD;  Location: [facility] ORS;  Service: Urology;  Laterality: N/A;  . INCISION AND DRAINAGE Left 2012   L hand infection  . IR GASTROSTOMY TUBE MOD SED  07/05/2020  . ROTATOR CUFF REPAIR Right    x 3  . SPINE SURGERY     HPI:  75 yo male adm to [facility] with Hemorrhagic shock due to massive hematuria/ chronic urinary retention.  Pt PMH + for Chiari Malformation s/p ACDF 04/05/2020 with revision 04/22/2020, functional quadriparesis, pleural  effusion.  Pt with dysphagia diagnosed post=ACDF via several MBS studies showing silent aspiration - 11/2 and 11/12.  Most recent exam 06/05/2020 showed improvement with swallowing with recommendation to advance to regular/thin.  He received a a PEG tube 06/2020 due to malnutrition and has not had po since hospitalized a few days prior.  Swallow eval ordered as pt needs po medications that can not be crushed.  Per pt's chart from SNF from review by RN, pt on soft/thin diet PTA.  Imaging from January 2022 concerning for pleural effusion but not adequate for thoracentesis.   Assessment / Plan / Recommendation Clinical Impression  Pt presents with clinical indications of swallow ability consistent with prior MBS from 05/2020 which showed no aspiration or penetration and only minimal retention at pyriform sinus more than vallecular space.  His CN exam is unremarkable but cough is marginally weak due to his quadriparesis.  SLP reviewed most recent MBS with pt on EPIC - showing him video loops.  Today, he did not pass 3 ounce water test due to requiring rest break- and was noted to clear his throat x2 following thin liquid intake.  Pt does report at times he feels that he has difficulty getting liquids to clear through his throat and multiple swallows with each liquid bolus noted.  Pt did not report retention sensation with liquids today.    Comparison of cold vs room temp liquids did not result in any subjective swallow changes.  Upon review of prior MBS, pt did have mild retention in pyriform sinus post-swallow of lqiuids - ? if his C3-C5 ACDF may contribute.  Regardless, no overt indication of  aspiration observed.    Fortunately pt masticates food well without cues - which he purports is due to his prior involvement with nutra systems.    Recommend dys3/thin diet with strict precautions including slow rate, adequate mastication, consumption of liquids during meals and staying partially upright for 30 minutes  after po intake. Using teach back, written precaution sign and demonstration of mitigation strategies - education completed.   Will follow up briefly to assure po tolerance and assess for instrumental swallow evaluation indication.  SLP Visit Diagnosis: Dysphagia, unspecified (R13.10)    Aspiration Risk  Mild aspiration risk    Diet Recommendation Dysphagia 3 (Mech soft);Thin liquid   Liquid Administration via: Cup;Straw Medication Administration: Whole meds with puree Supervision: Full supervision/cueing for compensatory strategies;Comment (pt needs to be fed due to quadriparesis) Compensations: Slow rate;Small sips/bites Postural Changes: Seated upright at 90 degrees;Remain upright for at least 30 minutes after po intake    Other  Recommendations Oral Care Recommendations: Oral care BID   Follow up Recommendations None      Frequency and Duration min 1 x/week  1 week       Prognosis Prognosis for Safe Diet Advancement: Good      Swallow Study   General HPI: 75 yo male adm to [facility] with Hemorrhagic shock due to massive hematuria/ chronic urinary retention.  Pt PMH + for Chiari Malformation s/p ACDF 04/05/2020 with revision 04/22/2020, functional quadriparesis, pleural effusion.  Pt with dysphagia diagnosed post=ACDF via several MBS studies showing silent aspiration - 11/2 and 11/12.  Most recent exam 06/05/2020 showed improvement with swallowing with recommendation to advance to regular/thin.  He received a a PEG tube 06/2020 due to malnutrition and has not had po since hospitalized a few days prior.  Swallow eval ordered as pt needs po medications that can not be crushed.  Per pt's chart from SNF from review by RN, pt on soft/thin diet PTA.  Imaging from January 2022 concerning for pleural effusion but not adequate for thoracentesis. Type of Study: Bedside Swallow Evaluation Previous Swallow Assessment: see hpi Diet Prior to this Study: NPO;PEG tube Temperature Spikes Noted:  No Respiratory Status: Room air History of Recent Intubation: No Behavior/Cognition: Alert;Cooperative;Pleasant mood Oral Cavity Assessment: Within Functional Limits Oral Care Completed by SLP: No Oral Cavity - Dentition: Adequate natural dentition Vision: Impaired for self-feeding Self-Feeding Abilities: Total assist Patient Positioning: Upright in bed Baseline Vocal Quality: Normal Volitional Cough: Strong Volitional Swallow: Able to elicit    Oral/Motor/Sensory Function Overall Oral Motor/Sensory Function: Within functional limits   Ice Chips Ice chips: Within functional limits Presentation: Spoon   Thin Liquid Thin Liquid: Impaired Presentation: Straw;Cup Pharyngeal  Phase Impairments: Multiple swallows;Throat Clearing - Delayed    Nectar Thick Nectar Thick Liquid: Not tested   Honey Thick Honey Thick Liquid: Not tested   Puree Puree: Within functional limits Presentation: Spoon   Solid     Solid: Within functional limits Other Comments: pt masticates foods thoroughly - stating he learned this from his nutritional instructions for weight loss      Chales Abrahams 11/15/2020,5:01 PM   Rolena Infante, MS [facility] SLP Acute Rehab Services Office [phone] Pager [phone]

## 2020-11-16 ENCOUNTER — Other Ambulatory Visit: Payer: Self-pay

## 2020-11-16 DIAGNOSIS — I10 Essential (primary) hypertension: Secondary | ICD-10-CM | POA: Diagnosis not present

## 2020-11-16 DIAGNOSIS — D62 Acute posthemorrhagic anemia: Secondary | ICD-10-CM | POA: Diagnosis not present

## 2020-11-16 DIAGNOSIS — R310 Gross hematuria: Secondary | ICD-10-CM | POA: Diagnosis not present

## 2020-11-16 DIAGNOSIS — R578 Other shock: Secondary | ICD-10-CM | POA: Diagnosis not present

## 2020-11-16 LAB — BASIC METABOLIC PANEL
Anion gap: 1 — ABNORMAL LOW (ref 5–15)
BUN: 28 mg/dL — ABNORMAL HIGH (ref 8–23)
CO2: 31 mmol/L (ref 22–32)
Calcium: 8 mg/dL — ABNORMAL LOW (ref 8.9–10.3)
Chloride: 107 mmol/L (ref 98–111)
Creatinine, Ser: 0.42 mg/dL — ABNORMAL LOW (ref 0.61–1.24)
GFR, Estimated: >60 mL/min (ref >60)
Glucose, Bld: 126 mg/dL — ABNORMAL HIGH (ref 70–99)
Potassium: 3.9 mmol/L (ref 3.5–5.1)
Sodium: 139 mmol/L (ref 135–145)

## 2020-11-16 LAB — CBC WITH DIFFERENTIAL/PLATELET
Abs Immature Granulocytes: 0.05 10*3/uL (ref 0.00–0.07)
Basophils Absolute: 0 10*3/uL (ref 0.0–0.1)
Basophils Relative: 0 %
Eosinophils Absolute: 0.6 10*3/uL — ABNORMAL HIGH (ref 0.0–0.5)
Eosinophils Relative: 7 %
HCT: 28.1 % — ABNORMAL LOW (ref 39.0–52.0)
Hemoglobin: 8.8 g/dL — ABNORMAL LOW (ref 13.0–17.0)
Immature Granulocytes: 1 %
Lymphocytes Relative: 13 %
Lymphs Abs: 1.2 10*3/uL (ref 0.7–4.0)
MCH: 27.6 pg (ref 26.0–34.0)
MCHC: 31.3 g/dL (ref 30.0–36.0)
MCV: 88.1 fL (ref 80.0–100.0)
Monocytes Absolute: 1.1 10*3/uL — ABNORMAL HIGH (ref 0.1–1.0)
Monocytes Relative: 12 %
Neutro Abs: 6.1 10*3/uL (ref 1.7–7.7)
Neutrophils Relative %: 67 %
Platelets: 151 10*3/uL (ref 150–400)
RBC: 3.19 MIL/uL — ABNORMAL LOW (ref 4.22–5.81)
RDW: 17.4 % — ABNORMAL HIGH (ref 11.5–15.5)
WBC: 9.2 10*3/uL (ref 4.0–10.5)
nRBC: 0 % (ref 0.0–0.2)

## 2020-11-16 MED ORDER — SODIUM CHLORIDE 0.9 % IV SOLN: Freq: Once | INTRAVENOUS | Status: AC

## 2020-11-16 MED ORDER — OSMOLITE 1.5 CAL PO LIQD
1000.0000 mL | ORAL | Status: DC
  Administered 2020-11-16 – 2020-11-17 (×2): 1000 mL
  Filled 2020-11-16: qty 1000

## 2020-11-16 MED ORDER — SODIUM CHLORIDE 0.9 % IV BOLUS
1000.0000 mL | Freq: Once | INTRAVENOUS | Status: AC
  Administered 2020-11-16: 1000 mL via INTRAVENOUS

## 2020-11-16 MED ORDER — SODIUM CHLORIDE 0.9 % IV BOLUS: 500.0000 mL | Freq: Once | INTRAVENOUS | Status: DC

## 2020-11-16 MED ORDER — ALBUMIN HUMAN 25 % IV SOLN
25.0000 g | Freq: Four times a day (QID) | INTRAVENOUS | Status: AC
  Administered 2020-11-16 – 2020-11-17 (×4): 25 g via INTRAVENOUS
  Filled 2020-11-16 (×2): qty 100

## 2020-11-16 MED ORDER — SODIUM CHLORIDE 0.9 % IV SOLN: INTRAVENOUS | Status: AC

## 2020-11-16 MED ORDER — FUROSEMIDE 10 MG/ML IJ SOLN
20.0000 mg | Freq: Once | INTRAMUSCULAR | Status: AC
  Administered 2020-11-16: 20 mg via INTRAVENOUS
  Filled 2020-11-16: qty 2

## 2020-11-16 NOTE — Progress Notes (Signed)
Nutrition Follow-up  DOCUMENTATION CODES:   Severe malnutrition in context of chronic illness  INTERVENTION:  - will adjust TF regimen to encourage increased intakes at meals: Osmolite 1.5 @ 80 ml/hr x16 hours/day (1700-0900) with 45 ml Prosource TF BID and 1 packet Juven BID. - this regimen will provide 2190 kcal (95% kcal need), 107 grams protein (93% estimated protein need), and 975 ml free water.    NUTRITION DIAGNOSIS:   Severe Malnutrition related to chronic illness (wounds) as evidenced by percent weight loss,severe fat depletion,severe muscle depletion. 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 Admitted to the hospital with severe hematuria, due to foley trauma complicated with hemorrhagic shock  Patient sleeping at this time with no family or visitors present. Diet advanced to Regular on 5/24 at 0912 and then changed to Dysphagia 3, thin liquids yesterday at 1433. The only documented meal completion was 25% of breakfast this AM.  Able to talk with RN who fed patient a few bites of breakfast before tech took over. RN denies patient having any coughing or difficulty chewing or swallowing with breakfast meal.   He has not been weighed since admission on 5/23. Moderate pitting edema to BUE documented in the edema section of flow sheet.  He has PEG which placed on 07/05/20. He is receiving Osmolite 1.5 @ 60 ml/hr with 45 ml Prosource TF BID and 1 packet Juven BID. This regimen is providing 2430 kcal, 117 grams protein, and 1097 ml free  water.     Labs reviewed; BUN: 28 mg/dl, creatinine: 0.42 mg/dl, Ca: 8 mg/dl. Medications reviewed; 1 capsule risaquad/day, 100 mg colace BID, 40 mg protonix/day, 17 g miralax/day. IVF; NS @ 50 ml/hr.   Diet Order:   Diet Order            DIET DYS 3 Room service appropriate? Yes; Fluid consistency: Thin  Diet effective now                 EDUCATION NEEDS:   No education needs have been identified at this time  Skin:  Skin Assessment: Skin Integrity Issues: Skin Integrity Issues:: Stage IV,Incisions Stage I: left elbow Stage IV: sacrum -osteomyelitis Incisions: penis (5/23)  Last BM:  5/25 (type 5 x1 and type 6 x1)  Height:   Ht Readings from Last 1 Encounters:  11/13/20 5' 7"  (1.702 m)    Weight:   Wt Readings from Last 1 Encounters:  11/13/20 89.4 kg     Estimated Nutritional Needs:  Kcal:  2300-2500 Protein:  115-130g Fluid:  2L/day     Jarome Matin, MS, RD, LDN, CNSC Inpatient Clinical Dietitian RD pager # available in [facility]  After hours/weekend pager # available in [facility]

## 2020-11-16 NOTE — Progress Notes (Signed)
[name]  This [facility] is[facility]osp[facility] patient with [facility],admittedto services on5/6/22 with a terminal diagnosis of abnormal weight loss. Per [facility] notes,on call was notified by [facility] nurse yesterday that the chronic indwelling Foley had been swapped out with a 16 Fr. coude with gross hematuria on return. Pt presented to [facility] for evaluation of gross hematuria. Per Dr. Kirt Boys, [facility] MD, this is a related admission.  Report exchanged with attending. Visited pt at the bedside. He is alert and oriented and eating breakfast. He reports he is feeling better and eager to discharge once he is able to. Denies any pain/symptoms that are not currently being managed by the staffs interventions.  V/S: 98.3, 108/51, HR 80, RR 17, SPO2 97% on RA I&O: 3895/925 Labs: RBC 3.19, hgb 8.8, HCT 28.1 IVs/PRNs: albumin 25 g IV q6h, NS bolus 1 liter x 1 for hypotension and MAPs in the 50's  Problem List:  Hemorrhagic shock due to massive hematuria/ chronic urinary retention- urine in foley is clear, VSS and is off pressor support. Requiring liter bolus today. Albumin and then possibly diuresing.   Quadriplegia complicated with large stage IV sacral decubitus ulcer- chronic, albumin 2.1, he is being frequently repositioned, wound care RN made recommendations.  Swallow dysfunction/ protein calorie malnutrition severe- tube feeds to be restarted, albumin 2.1, RD consult to assist with nutrition. TF to be adjusted as he was restarted on an oral diet on 5/25, dysphagia 3.    Goals of care:Ongoing. Pt recently admitted onto hospice services on 5/6, sent to ED for gross hematuria after foley exchange. Pt advises that he understands what hospice is but he would like to be treated for what can be treated.   D/C plan: Back to his facility with hospice support once he is medically optimized.  Family:  Updated.  IDT: Hospice team updated  Thank you, Wallis Bamberg RN, BSN, CCRN [facility] Liaison

## 2020-11-16 NOTE — Progress Notes (Addendum)
PROGRESS NOTE    Shannon [name]  KZS:[id number] DOB: Mar 07, 1946 DOA: 11/12/2020 PCP: [name], MD    Chief Complaint  Patient presents with  . Hematuria  . Catheter Issue    Brief Narrative:  Mr. Triggs was admitted to the hospital with severe hematuria, due to foley trauma complicated with hemorrhagic shock.  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 Patient underwent Foley catheter exchange 5/22, developing severe hematuria and abdominal pain. On his initial physical examination his blood pressure was 74/43, heart rate 107, respiratory rate 14, temperature 97.8, oxygen saturation 99%. His lungs had bibasilar rales, heart S1-S2, present, rhythmic, his abdomen had PEG tube in place, positive bowel sounds, no lower extremity edema.Large stage IV decubitus ulcer.  Sodium 135, potassium 3.9, chloride 102, bicarb 25, glucose 175, BUN 35, creatinine 0.65, white count 7.7, hemoglobin 7.1, hematocrit 26.6, platelets 264. SARS COVID-19 negative.  Urinalysis with frank hematuria,>50 white cells.  Renal CT with moderate amount of acute blood products within the urinary bladder lumen, likely secondary to sequela  associated with recent Foley catheter placement. Bilateral subcentimeter nonobstructing renal calculi. Cholelithiasis.  EKG 160 bpm, normal axis, normal intervals, sinus rhythm, positive LVH, poor R wave progression, no significant ST segment or T wave changes, noisy baseline.  In the emergency department patient had a 22 French three-way Foley catheter placed for continuous bladder irrigation. Patient unable to tolerate bladder irrigation and was taken to the operating room by Dr. Arita Miss.  Patient  had cystoscopy, ureteroscopy, evacuation of blood clot and fulguration.  Persistent hypotension required vasopressors and transferred to the intensive care unit.  SP 6 units PRBC transfusion. Blood pressure improved and weaning off norepinephrine infusion.     Assessment & Plan:   Principal Problem:   Hemorrhagic shock (HCC) Active Problems:   Quadriplegia and quadriparesis (HCC)   Acute blood loss anemia   Chronic indwelling Foley catheter   Protein-calorie malnutrition, severe   Gross hematuria   Stage IV pressure ulcer of sacral region (HCC)   Anemia  #1 hemorrhagic shock secondary to massive hematuria/chronic urinary retention -Patient had presented with a hemorrhagic shock secondary to massive hematuria noted. -Patient was on pressors which have been weaned off with systolic blood pressures in the low 100s this morning.  Patient noted to have systolic blood pressures in the 90s overnight and received fluid boluses.. -Status post transfusion 6 units packed red blood cells.- -Urine output of 925 cc over the past 24 hours. -Currently on midodrine. -2D echo with preserved EF. -Hemoglobin currently stable at 8.8. -Follow H&H.  2.  Quadriplegia complicated by large stage IV sacral decubitus ulcer -Patient seen by wound care RN. -No purulence or fluctuation noted at wound site. -Continue current wound care. -Frequent turning.  3.  Swallow dysfunction/severe protein calorie malnutrition -Continue current tube feeds. -SLP evaluated patient and patient started on a dysphagia 3 diet.   4.  Depression -Continue duloxetine.  5.  Iron deficiency anemia -Hemoglobin stable at 8.8.  -Outpatient follow-up.  6.  Pressure Injury 06/20/20 Vertebral column Lower;Medial Stage 2 -  Partial thickness loss of dermis presenting as a shallow open injury with a red, pink wound bed without slough. (Active)  06/20/20 0130  Location: Vertebral column  Location Orientation: Lower;Medial   Staging: Stage 2 -  Partial  thickness loss of dermis presenting as a shallow open injury with a red, pink wound bed without slough.  Wound Description (Comments):   Present on Admission: Yes     Pressure Injury 06/22/20 Sacrum Posterior Stage 4 - Full thickness tissue loss with exposed bone, tendon or muscle. PHYSICAL THERAPY/HYDROTHERAPY (Active)  06/22/20 1400  Location: Sacrum  Location Orientation: Posterior  Staging: Stage 4 - Full thickness tissue loss with exposed bone, tendon or muscle.  Wound Description (Comments): PHYSICAL THERAPY/HYDROTHERAPY  Present on Admission: Yes    7.  Bilateral upper extremity and lower extremity edema/Volume ovwerload -Likely secondary to aggressive fluid resuscitation on presentation in the setting of hypoalbuminemia. -Albumin of 2.1. -Patient noted to be +6.4 L during his hospitalization. -Decrease IV fluids to 50 cc an hour. -IV albumin every 6 hours x24 hours. -If blood pressure improves will give Lasix 20 mg IV x1.     DVT prophylaxis: SCDs Code Status: Full Family Communication: Updated patient.  No family at bedside. Disposition:   Status is: Inpatient    Dispo: The patient is from: SNF              Anticipated d/c is to: SNF with hospice following.              Patient currently not stable for discharge   Difficult to place patient: No       Consultants:   Urology: Dr. Arita Miss 11/12/2020  Wound care: Janit Pagan, RN 11/13/2020  Procedures:   CT renal stone protocol 11/12/2020  2D echo 11/13/2020  Cystourethroscopy, evacuation of blood clot, fulguration per Dr. Arita Miss 11/13/2020  Transfusion of 6 units PRBCs 11/13/2020-11/14/2020  Antimicrobials:  None.   Subjective: Awake, alert.  Denies any chest pain.  No shortness of breath.  No abdominal pain.  Quadriplegic.  Noted to be hypotensive overnight requiring fluid boluses.  Foley catheter with clear urine.  Objective: Vitals:   11/16/20 0400 11/16/20 0401  11/16/20 0732 11/16/20 0800  BP: (!) 94/45     Pulse: 86     Resp: 16     Temp:  98.4 F (36.9 C)  98.3 F (36.8 C)  TempSrc:  Oral  Oral  SpO2: 96%  97%   Weight:      Height:        Intake/Output Summary (Last 24 hours) at 11/16/2020 1000 Last data filed at 11/16/2020 0510 Gross per 24 hour  Intake 3895.62 ml  Output 925 ml  Net 2970.62 ml   Filed Weights   11/13/20 0247  Weight: 89.4 kg    Examination:  General exam: NAD. Respiratory system: Lungs clear to auscultation bilaterally anterior lung fields.  No wheezes, no crackles, no rhonchi.  Normal respiratory effort.   Cardiovascular system: RRR no murmurs rubs or gallops.  No JVD.  1+ bilateral lower extremity edema.  Gastrointestinal system: Abdomen is nondistended, soft and nontender. No organomegaly or masses felt. Normal bowel sounds heard.  G-tube intact. Central nervous system: Alert and oriented.  Quadriplegia.   Extremities: Quadriplegia.  Bilateral upper extremity and lower extremity edema. Skin: No rashes, lesions or ulcers Psychiatry: Judgement and insight appear normal. Mood & affect appropriate.     Data Reviewed: I have personally reviewed following labs and imaging studies  CBC: Recent Labs  Lab 11/12/20 2000 11/13/20 1030 11/13/20 1123 11/13/20 2100 11/14/20 0900 11/14/20 1845 11/15/20 0236 11/16/20 0234  WBC 7.7 11.4*  --   --  10.2  --  8.9 9.2  NEUTROABS 6.2  7.7  --   --  6.7  --  5.4 6.1  HGB 7.1* 5.5*   < > 7.2* 7.2* 8.6* 8.5* 8.8*  HCT 26.6* 17.7*   < > 22.3* 22.8* 27.0* 27.0* 28.1*  MCV 80.6 81.6  --   --  84.1  --  86.3 88.1  PLT 264 164  --   --  130*  --  128* 151   < > = values in this interval not displayed.    Basic Metabolic Panel: Recent Labs  Lab 11/12/20 2000 11/13/20 1030 11/14/20 0900 11/15/20 0236 11/16/20 0234  NA 135 136 139 139 139  K 3.9 3.8 3.6 3.6 3.9  CL 102 107 107 106 107  CO2 25 26 29 30 31   GLUCOSE 175* 144* 149* 126* 126*  BUN 35* 29* 27* 29*  28*  CREATININE 0.65 0.57* 0.46* 0.47* 0.42*  CALCIUM 8.6* 7.9* 7.9* 8.1* 8.0*    GFR: Estimated Creatinine Clearance: 86.4 mL/min (A) (by C-G formula based on SCr of 0.42 mg/dL (L)).  Liver Function Tests: Recent Labs  Lab 11/12/20 2000  AST 24  ALT 10  ALKPHOS 113  BILITOT 0.5  PROT 7.9  ALBUMIN 2.1*    CBG: No results for input(s): GLUCAP in the last 168 hours.   Recent Results (from the past 240 hour(s))  Urine Culture     Status: Abnormal   Collection Time: 11/12/20  8:00 PM   Specimen: Urine, Random  Result Value Ref Range Status   Specimen Description   Final    URINE, RANDOM Performed at [facility], [address] [place], [place] [address]    Special Requests   Final    NONE Performed at [facility], [address] [place], [place] [address]    Culture 20,000 COLONIES/mL PSEUDOMONAS AERUGINOSA (A)  Final   Report Status 11/15/2020 FINAL  Final   Organism ID, Bacteria PSEUDOMONAS AERUGINOSA (A)  Final      Susceptibility   Pseudomonas aeruginosa - MIC*    CEFTAZIDIME 4 SENSITIVE Sensitive     CIPROFLOXACIN <=0.25 SENSITIVE Sensitive     GENTAMICIN <=1 SENSITIVE Sensitive     IMIPENEM 2 SENSITIVE Sensitive     PIP/TAZO 8 SENSITIVE Sensitive     CEFEPIME 2 SENSITIVE Sensitive     * 20,000 COLONIES/mL PSEUDOMONAS AERUGINOSA  Resp Panel by RT-PCR (Flu A&B, Covid) Nasopharyngeal Swab     Status: None   Collection Time: 11/12/20 11:23 PM   Specimen: Nasopharyngeal Swab; Nasopharyngeal(NP) swabs in vial transport medium  Result Value Ref Range Status   SARS Coronavirus 2 by RT PCR NEGATIVE NEGATIVE Final    Comment: (NOTE) SARS-CoV-2 target nucleic acids are NOT DETECTED.  The SARS-CoV-2 RNA is generally detectable in upper respiratory specimens during the acute phase of infection. The lowest concentration of SARS-CoV-2 viral copies this assay can detect is 138 copies/mL. A negative result does not preclude  SARS-Cov-2 infection and should not be used as the sole basis for treatment or other patient management decisions. A negative result may occur with  improper specimen collection/handling, submission of specimen other than nasopharyngeal swab, presence of viral mutation(s) within the areas targeted by this assay, and inadequate number of viral copies(<138 copies/mL). A negative result must be combined with clinical observations, patient history, and epidemiological information. The expected result is Negative.  Fact Sheet for Patients:  BloggerCourse.comhttps://www.fda.gov/media/152166/download  Fact Sheet for Healthcare Providers:  SeriousBroker.ithttps://www.fda.gov/media/152162/download  This test is no t yet  approved or cleared by the [place] and  has been authorized for detection and/or diagnosis of SARS-CoV-2 by FDA under an Emergency Use Authorization (EUA). This EUA will remain  in effect (meaning this test can be used) for the duration of the COVID-19 declaration under Section 564(b)(1) of the Act, 21 U.S.C.section 360bbb-3(b)(1), unless the authorization is terminated  or revoked sooner.       Influenza A by PCR NEGATIVE NEGATIVE Final   Influenza B by PCR NEGATIVE NEGATIVE Final    Comment: (NOTE) The Xpert Xpress SARS-CoV-2/FLU/RSV plus assay is intended as an aid in the diagnosis of influenza from Nasopharyngeal swab specimens and should not be used as a sole basis for treatment. Nasal washings and aspirates are unacceptable for Xpert Xpress SARS-CoV-2/FLU/RSV testing.  Fact Sheet for Patients: BloggerCourse.com  Fact Sheet for Healthcare Providers: SeriousBroker.it  This test is not yet approved or cleared by the [place] FDA and has been authorized for detection and/or diagnosis of SARS-CoV-2 by FDA under an Emergency Use Authorization (EUA). This EUA will remain in effect (meaning this test can be used) for the duration of  the COVID-19 declaration under Section 564(b)(1) of the Act, 21 U.S.C. section 360bbb-3(b)(1), unless the authorization is terminated or revoked.  Performed at [facility], [address] [place], [place] [address]   MRSA PCR Screening     Status: Abnormal   Collection Time: 11/13/20  2:10 AM   Specimen: Nasal Mucosa; Nasopharyngeal  Result Value Ref Range Status   MRSA by PCR POSITIVE (A) NEGATIVE Final    Comment:        The GeneXpert MRSA Assay (FDA approved for NASAL specimens only), is one component of a comprehensive MRSA colonization surveillance program. It is not intended to diagnose MRSA infection nor to guide or monitor treatment for MRSA infections. RESULT CALLED TO, READ BACK BY AND VERIFIED WITH: STACEY WEAVER AT 0407 ON 11/13/20 BY Katha Hamming Performed at [facility], [address], [place], [place] [address]          Radiology Studies: No results found.      Scheduled Meds: . acetaminophen (TYLENOL) oral liquid 160 mg/5 mL  1,000 mg Per Tube BID  . acidophilus  1 capsule Oral Daily  . albuterol  2 puff Inhalation Daily  . Chlorhexidine Gluconate Cloth  6 each Topical Daily  . docusate  100 mg Per Tube BID  . DULoxetine  30 mg Oral Daily  . feeding supplement (OSMOLITE 1.5 CAL)  1,000 mL Per Tube Q24H  . feeding supplement (PROSource TF)  45 mL Per Tube BID  . ferrous sulfate  300 mg Per Tube TID  . mouth rinse  15 mL Mouth Rinse BID  . midodrine  10 mg Per Tube TID  . mupirocin ointment  1 application Nasal BID  . nutrition supplement (JUVEN)  1 packet Per Tube BID BM  . pantoprazole sodium  40 mg Per Tube Daily  . polyethylene glycol  17 g Per Tube Daily  . pregabalin  100 mg Per Tube BID  . scopolamine  1 patch Transdermal Q72H  . tamsulosin  0.4 mg Oral Daily   Continuous Infusions: . sodium chloride    . sodium chloride       LOS: 4 days    Time spent: 40 minutes    Ramiro Harvest,  MD [facility]   To contact the attending provider between 7A-7P or the covering provider during after hours  7P-7A, please log into the web site [email or web address] and access using universal [place] password for that web site. If you do not have the password, please call the hospital operator.  11/16/2020, 10:00 AM

## 2020-11-16 NOTE — TOC Progression Note (Signed)
Transition of Care [facility]) - Progression Note    Patient Details  Name: Shannon [name] MRN: [id number] Date of Birth: 10/23/1945  [facility] CM/SW Contact  Golda Acre, RN Phone Number: 11/16/2020, 7:38 AM  Clinical Narrative:    PLAN: to return to home with hospoice care that is already in place.   Expected Discharge Plan: Home/Self Care Barriers to Discharge: Continued Medical Work up  Expected Discharge Plan and Services Expected Discharge Plan: Home/Self Care   Discharge Planning Services: CM Consult   Living arrangements for the past 2 months: Single Family Home                                       Social Determinants of Health (SDOH) Interventions    Readmission Risk Interventions No flowsheet data found.

## 2020-11-16 NOTE — Progress Notes (Signed)
Hypotensive with Map in the 50's SBP 90's , Discussed with [facility] night coverage , Will give NS 1 liter bolus

## 2020-11-17 DIAGNOSIS — R578 Other shock: Secondary | ICD-10-CM | POA: Diagnosis not present

## 2020-11-17 DIAGNOSIS — D62 Acute posthemorrhagic anemia: Secondary | ICD-10-CM | POA: Diagnosis not present

## 2020-11-17 DIAGNOSIS — R310 Gross hematuria: Secondary | ICD-10-CM | POA: Diagnosis not present

## 2020-11-17 DIAGNOSIS — I10 Essential (primary) hypertension: Secondary | ICD-10-CM | POA: Diagnosis not present

## 2020-11-17 DIAGNOSIS — E8770 Fluid overload, unspecified: Secondary | ICD-10-CM

## 2020-11-17 LAB — CBC
HCT: 25.9 % — ABNORMAL LOW (ref 39.0–52.0)
Hemoglobin: 7.9 g/dL — ABNORMAL LOW (ref 13.0–17.0)
MCH: 27.1 pg (ref 26.0–34.0)
MCHC: 30.5 g/dL (ref 30.0–36.0)
MCV: 89 fL (ref 80.0–100.0)
Platelets: 133 10*3/uL — ABNORMAL LOW (ref 150–400)
RBC: 2.91 MIL/uL — ABNORMAL LOW (ref 4.22–5.81)
RDW: 17.6 % — ABNORMAL HIGH (ref 11.5–15.5)
WBC: 7.4 10*3/uL (ref 4.0–10.5)
nRBC: 0 % (ref 0.0–0.2)

## 2020-11-17 LAB — RENAL FUNCTION PANEL
Albumin: 2.5 g/dL — ABNORMAL LOW (ref 3.5–5.0)
Anion gap: 1 — ABNORMAL LOW (ref 5–15)
BUN: 29 mg/dL — ABNORMAL HIGH (ref 8–23)
CO2: 31 mmol/L (ref 22–32)
Calcium: 8.4 mg/dL — ABNORMAL LOW (ref 8.9–10.3)
Chloride: 108 mmol/L (ref 98–111)
Creatinine, Ser: 0.35 mg/dL — ABNORMAL LOW (ref 0.61–1.24)
GFR, Estimated: >60 mL/min (ref >60)
Glucose, Bld: 111 mg/dL — ABNORMAL HIGH (ref 70–99)
Phosphorus: 2.6 mg/dL (ref 2.5–4.6)
Potassium: 3.7 mmol/L (ref 3.5–5.1)
Sodium: 140 mmol/L (ref 135–145)

## 2020-11-17 MED ORDER — FUROSEMIDE 10 MG/ML IJ SOLN
20.0000 mg | Freq: Once | INTRAMUSCULAR | Status: AC
  Administered 2020-11-17: 20 mg via INTRAVENOUS
  Filled 2020-11-17: qty 2

## 2020-11-17 NOTE — Progress Notes (Signed)
PROGRESS NOTE    Shannon [name]  UJW:[id number] DOB: 03-01-1946 DOA: 11/12/2020 PCP: Jeoffrey MassedMcGowen, Philip H, MD    Chief Complaint  Patient presents with  . Hematuria  . Catheter Issue    Brief Narrative:  Shannon [name] was admitted to the hospital with severe hematuria, due to foley trauma complicated with hemorrhagic shock.  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 Patient underwent Foley catheter exchange 5/22, developing severe hematuria and abdominal pain. On his initial physical examination his blood pressure was 74/43, heart rate 107, respiratory rate 14, temperature 97.8, oxygen saturation 99%. His lungs had bibasilar rales, heart S1-S2, present, rhythmic, his abdomen had PEG tube in place, positive bowel sounds, no lower extremity edema.Large stage IV decubitus ulcer.  Sodium 135, potassium 3.9, chloride 102, bicarb 25, glucose 175, BUN 35, creatinine 0.65, white count 7.7, hemoglobin 7.1, hematocrit 26.6, platelets 264. SARS COVID-19 negative.  Urinalysis with frank hematuria,>50 white cells.  Renal CT with moderate amount of acute blood products within the urinary bladder lumen, likely secondary to sequela  associated with recent Foley catheter placement. Bilateral subcentimeter nonobstructing renal calculi. Cholelithiasis.  EKG 160 bpm, normal axis, normal intervals, sinus rhythm, positive LVH, poor R wave progression, no significant ST segment or T wave changes, noisy baseline.  In the emergency department patient had a 22 French three-way Foley catheter placed for continuous bladder irrigation. Patient unable to tolerate bladder irrigation and was taken to the operating room by Dr. Arita MissPace.  Patient  had cystoscopy, ureteroscopy, evacuation of blood clot and fulguration.  Persistent hypotension required vasopressors and transferred to the intensive care unit.  SP 6 units PRBC transfusion. Blood pressure improved and weaning off norepinephrine infusion.     Assessment & Plan:   Principal Problem:   Hemorrhagic shock (HCC) Active Problems:   Quadriplegia and quadriparesis (HCC)   Acute blood loss anemia   Chronic indwelling Foley catheter   Protein-calorie malnutrition, severe   Gross hematuria   Stage IV pressure ulcer of sacral region (HCC)   Anemia  1 hemorrhagic shock secondary to massive hematuria/chronic urinary retention -Patient had presented with a hemorrhagic shock secondary to massive hematuria noted. -Patient was on pressors which have been weaned off with systolic blood pressures still borderline but improving after being placed on IV albumin.  -Status post transfusion 6 units packed red blood cells.- -Urine output of 3.5 L over the past 24 hours. -Continue midodrine.  -2D echo with preserved EF. -Hemoglobin at 7.9. -Transfusion threshold hemoglobin < 7. -Follow H&H.  2.  Quadriplegia complicated by large stage IV sacral decubitus ulcer -Patient seen by wound care RN. -No purulence or fluctuation noted at wound site. -Continue current wound care. -Frequent turning.  3.  Swallow dysfunction/severe protein calorie malnutrition -Continue current tube feeds. -Patient placed on IV albumin every 6 hours x24 hours yesterday. -SLP evaluated patient and patient started on a dysphagia 3 diet.   4.  Depression -Cymbalta.    5.  Iron deficiency anemia -Hemoglobin currently at 7.9.   -Consider dose of IV Feraheme tomorrow.   -Outpatient follow-up.  6.  Pressure Injury 06/20/20 Vertebral column Lower;Medial Stage 2 -  Partial thickness loss of dermis presenting as a shallow open injury with a red, pink wound bed without slough. (Active)  06/20/20 0130   Location:  Vertebral column  Location Orientation: Lower;Medial  Staging: Stage 2 -  Partial thickness loss of dermis presenting as a shallow open injury with a red, pink wound bed without slough.  Wound Description (Comments):   Present on Admission: Yes     Pressure Injury 06/22/20 Sacrum Posterior Stage 4 - Full thickness tissue loss with exposed bone, tendon or muscle. PHYSICAL THERAPY/HYDROTHERAPY (Active)  06/22/20 1400  Location: Sacrum  Location Orientation: Posterior  Staging: Stage 4 - Full thickness tissue loss with exposed bone, tendon or muscle.  Wound Description (Comments): PHYSICAL THERAPY/HYDROTHERAPY  Present on Admission: Yes    7.  Bilateral upper extremity and lower extremity edema/Volume ovwerload -Likely secondary to aggressive fluid resuscitation on presentation in the setting of hypoalbuminemia. -Albumin of 2.1. -Patient noted to be +6.4 L during his hospitalization. -IVF have been saline locked.   -Patient placed on IV albumin every 6 hours x24 hours and given a dose of Lasix 20 mg IV x1 (11/16/2020) with urine output of 3.5 L over the past 24 hours.   -Some clinical improvement however still significantly volume overloaded with scrotal swelling.   -Patient receiving last dose of IV albumin we will also give another dose of Lasix 20 mg IV x1.   -Elevate scrotum.   -Strict I's and O's.  Daily weights.   -Monitor blood pressure with diuretics.     DVT prophylaxis: SCDs Code Status: Full Family Communication: Updated patient.  No family at bedside. Disposition:   Status is: Inpatient    Dispo: The patient is from: SNF              Anticipated d/c is to: SNF with hospice following.              Patient currently not stable for discharge   Difficult to place patient: No       Consultants:   Urology: Dr. Arita Miss 11/12/2020  Wound care: Janit Pagan, RN 11/13/2020  Procedures:   CT renal stone protocol 11/12/2020  2D echo  11/13/2020  Cystourethroscopy, evacuation of blood clot, fulguration per Dr. Arita Miss 11/13/2020  Transfusion of 6 units PRBCs 11/13/2020-11/14/2020  Antimicrobials:  None.   Subjective: Alert.  Awake.  Denies any chest pain.  No shortness of breath.  Stated swelling has improved.  Per RN patient noted to have significant scrotal swelling.  Patient complaining of tooth pain/mouth pain ongoing since April 05, 2020.    Objective: Vitals:   11/17/20 0500 11/17/20 0600 11/17/20 0743 11/17/20 0800  BP: (!) 104/47 (!) 100/45    Pulse: 75 86    Resp: 14 14    Temp:    98.8 F (37.1 C)  TempSrc:    Oral  SpO2: 96% 94% 96%   Weight:      Height:        Intake/Output Summary (Last 24 hours) at 11/17/2020 0933 Last data filed at 11/17/2020 0615 Gross per 24 hour  Intake 3067.15 ml  Output 3500 ml  Net -432.85 ml   Filed Weights   11/13/20 0247  Weight: 89.4 kg    Examination:  General exam: NAD.  Improving anasarca.  Poor dentition Respiratory system: CTA B anterior lung fields.  No wheezes, no crackles, no rhonchi.  Normal respiratory effort. Cardiovascular system: RRR no murmurs rubs or gallops.  No JVD.  1-2+ bilateral lower extremity edema.  Gastrointestinal system: Abdomen is nondistended, soft and nontender. No organomegaly or masses felt. Normal bowel sounds heard.  G-tube intact. Central nervous system: Alert  and oriented.  Quadriplegia.   GU: Scrotal swelling. Extremities: Quadriplegia.  2-3+ bilateral upper extremity and lower extremity edema which is improving. Skin: No rashes, lesions or ulcers Psychiatry: Judgement and insight appear normal. Mood & affect appropriate.     Data Reviewed: I have personally reviewed following labs and imaging studies  CBC: Recent Labs  Lab 11/12/20 2000 11/13/20 1030 11/13/20 1123 11/14/20 0900 11/14/20 1845 11/15/20 0236 11/16/20 0234 11/17/20 0407  WBC 7.7 11.4*  --  10.2  --  8.9 9.2 7.4  NEUTROABS 6.2 7.7  --  6.7  --   5.4 6.1  --   HGB 7.1* 5.5*   < > 7.2* 8.6* 8.5* 8.8* 7.9*  HCT 26.6* 17.7*   < > 22.8* 27.0* 27.0* 28.1* 25.9*  MCV 80.6 81.6  --  84.1  --  86.3 88.1 89.0  PLT 264 164  --  130*  --  128* 151 133*   < > = values in this interval not displayed.    Basic Metabolic Panel: Recent Labs  Lab 11/13/20 1030 11/14/20 0900 11/15/20 0236 11/16/20 0234 11/17/20 0407  NA 136 139 139 139 140  K 3.8 3.6 3.6 3.9 3.7  CL 107 107 106 107 108  CO2 26 29 30 31 31   GLUCOSE 144* 149* 126* 126* 111*  BUN 29* 27* 29* 28* 29*  CREATININE 0.57* 0.46* 0.47* 0.42* 0.35*  CALCIUM 7.9* 7.9* 8.1* 8.0* 8.4*  PHOS  --   --   --   --  2.6    GFR: Estimated Creatinine Clearance: 86.4 mL/min (A) (by C-G formula based on SCr of 0.35 mg/dL (L)).  Liver Function Tests: Recent Labs  Lab 11/12/20 2000 11/17/20 0407  AST 24  --   ALT 10  --   ALKPHOS 113  --   BILITOT 0.5  --   PROT 7.9  --   ALBUMIN 2.1* 2.5*    CBG: No results for input(s): GLUCAP in the last 168 hours.   Recent Results (from the past 240 hour(s))  Urine Culture     Status: Abnormal   Collection Time: 11/12/20  8:00 PM   Specimen: Urine, Random  Result Value Ref Range Status   Specimen Description   Final    URINE, RANDOM Performed at [facility], [address] [place], [place]    Special Requests   Final    NONE Performed at [facility], [address], [place], [place]    Culture 20,000 COLONIES/mL PSEUDOMONAS AERUGINOSA (A)  Final   Report Status 11/15/2020 FINAL  Final   Organism ID, Bacteria PSEUDOMONAS AERUGINOSA (A)  Final      Susceptibility   Pseudomonas aeruginosa - MIC*    CEFTAZIDIME 4 SENSITIVE Sensitive     CIPROFLOXACIN <=0.25 SENSITIVE Sensitive     GENTAMICIN <=1 SENSITIVE Sensitive     IMIPENEM 2 SENSITIVE Sensitive     PIP/TAZO 8 SENSITIVE Sensitive     CEFEPIME 2 SENSITIVE Sensitive     * 20,000 COLONIES/mL PSEUDOMONAS AERUGINOSA  Resp  Panel by RT-PCR (Flu A&B, Covid) Nasopharyngeal Swab     Status: None   Collection Time: 11/12/20 11:23 PM   Specimen: Nasopharyngeal Swab; Nasopharyngeal(NP) swabs in vial transport medium  Result Value Ref Range Status   SARS Coronavirus 2 by RT PCR NEGATIVE NEGATIVE Final    Comment: (NOTE) SARS-CoV-2 target nucleic acids are NOT DETECTED.  The SARS-CoV-2 RNA is generally detectable in upper  respiratory specimens during the acute phase of infection. The lowest concentration of SARS-CoV-2 viral copies this assay can detect is 138 copies/mL. A negative result does not preclude SARS-Cov-2 infection and should not be used as the sole basis for treatment or other patient management decisions. A negative result may occur with  improper specimen collection/handling, submission of specimen other than nasopharyngeal swab, presence of viral mutation(s) within the areas targeted by this assay, and inadequate number of viral copies(<138 copies/mL). A negative result must be combined with clinical observations, patient history, and epidemiological information. The expected result is Negative.  Fact Sheet for Patients:  BloggerCourse.com  Fact Sheet for Healthcare Providers:  SeriousBroker.it  This test is no t yet approved or cleared by the [place] FDA and  has been authorized for detection and/or diagnosis of SARS-CoV-2 by FDA under an Emergency Use Authorization (EUA). This EUA will remain  in effect (meaning this test can be used) for the duration of the COVID-19 declaration under Section 564(b)(1) of the Act, 21 U.S.C.section 360bbb-3(b)(1), unless the authorization is terminated  or revoked sooner.       Influenza A by PCR NEGATIVE NEGATIVE Final   Influenza B by PCR NEGATIVE NEGATIVE Final    Comment: (NOTE) The Xpert Xpress SARS-CoV-2/FLU/RSV plus assay is intended as an aid in the diagnosis of influenza from Nasopharyngeal  swab specimens and should not be used as a sole basis for treatment. Nasal washings and aspirates are unacceptable for Xpert Xpress SARS-CoV-2/FLU/RSV testing.  Fact Sheet for Patients: BloggerCourse.com  Fact Sheet for Healthcare Providers: SeriousBroker.it  This test is not yet approved or cleared by the [place] FDA and has been authorized for detection and/or diagnosis of SARS-CoV-2 by FDA under an Emergency Use Authorization (EUA). This EUA will remain in effect (meaning this test can be used) for the duration of the COVID-19 declaration under Section 564(b)(1) of the Act, 21 U.S.C. section 360bbb-3(b)(1), unless the authorization is terminated or revoked.  Performed at [facility], [address] [place], [place] [address]   MRSA PCR Screening     Status: Abnormal   Collection Time: 11/13/20  2:10 AM   Specimen: Nasal Mucosa; Nasopharyngeal  Result Value Ref Range Status   MRSA by PCR POSITIVE (A) NEGATIVE Final    Comment:        The GeneXpert MRSA Assay (FDA approved for NASAL specimens only), is one component of a comprehensive MRSA colonization surveillance program. It is not intended to diagnose MRSA infection nor to guide or monitor treatment for MRSA infections. RESULT CALLED TO, READ BACK BY AND VERIFIED WITH: STACEY WEAVER AT 0407 ON 11/13/20 BY Katha Hamming Performed at [facility], [address] [place], [place] [address]          Radiology Studies: No results found.      Scheduled Meds: . acetaminophen (TYLENOL) oral liquid 160 mg/5 mL  1,000 mg Per Tube BID  . acidophilus  1 capsule Oral Daily  . albuterol  2 puff Inhalation Daily  . Chlorhexidine Gluconate Cloth  6 each Topical Daily  . docusate  100 mg Per Tube BID  . DULoxetine  30 mg Oral Daily  . feeding supplement (PROSource TF)  45 mL Per Tube BID  . ferrous sulfate  300 mg Per Tube TID  .  mouth rinse  15 mL Mouth Rinse BID  . midodrine  10 mg Per Tube TID  . mupirocin ointment  1 application Nasal BID  .  nutrition supplement (JUVEN)  1 packet Per Tube BID BM  . pantoprazole sodium  40 mg Per Tube Daily  . polyethylene glycol  17 g Per Tube Daily  . pregabalin  100 mg Per Tube BID  . scopolamine  1 patch Transdermal Q72H  . tamsulosin  0.4 mg Oral Daily   Continuous Infusions: . sodium chloride    . feeding supplement (OSMOLITE 1.5 CAL) Stopped (11/17/20 0615)     LOS: 5 days    Time spent: 40 minutes    Ramiro Harvest, MD [facility]   To contact the attending provider between 7A-7P or the covering provider during after hours 7P-7A, please log into the web site [email or web address] and access using universal [place] password for that web site. If you do not have the password, please call the hospital operator.  11/17/2020, 9:33 AM

## 2020-11-17 NOTE — Progress Notes (Signed)
[facility] [address] [facility]) Hospital Liaison Note  This patient is a current hospice patient with [facility], admitted to services on 10/27/20 with a terminal diagnosis of abnormal weight loss.  Per [facility] notes, on call was notified by [facility] nurse yesterday that the chronic indwelling Foley had been swapped out with a 16 Fr. coude with gross hematuria on return.  Pt presented to [facility] for evaluation of gross hematuria. Per Dr. Kirt Boys, [facility] MD, this is a related admission.   Visited patient at bedside, he was awake and alert and visiting with his niece. Patient reports to feeling some better, he believes he may be able to go home in the next day or so. Exchanged report with bedside nurse Lurena Joiner. Patient is currently being treated for scrotal and penile swelling which worsened over night with IV Lasix.    V/S: 98.1 oral, 103/46, HR 71, RR 17, SPO2 98% RA I&O:  Intake 3067.15 ml  Output 3500 ml   Labs:  Albumin 2.5 IVs/PRNs: NS @ 75 No new diagnostics   Problem List:   Hemorrhagic shock due to massive hematuria/ chronic urinary retention - urine in foley is clear, VSS and is off pressor support   Quadriplegia complicated with large stage IV sacral decubitus ulcer - chronic, albumin 2.1, he is being frequently repositioned, wound care RN made recommendations   Swallow dysfunction/ protein calorie malnutrition severe - tube feeds to be restarted, albumin 2.1, RD consult to assist with nutrition.   Scrotal and Penile Edema- Patient received 24 hours of Albumin every 6 hours and at this time is requiring dosing of IV Lasix   Goals of care: Ongoing. Pt recently admitted onto hospice services on 5/6, sent to ED for gross hematuria after foley exchange. Pt advises that he understands what hospice is but he would like to be treated for what can be treated.    D/C plan: Back to his facility with hospice support once he is medically optimized.   Family: Updated.   IDT:  Hospice team updated  Please call for any Hospice related questions or concerns.   Thea Gist, Charity fundraiser, [facility] Liaison  [phone]

## 2020-11-18 DIAGNOSIS — N5089 Other specified disorders of the male genital organs: Secondary | ICD-10-CM

## 2020-11-18 DIAGNOSIS — R578 Other shock: Secondary | ICD-10-CM | POA: Diagnosis not present

## 2020-11-18 DIAGNOSIS — I10 Essential (primary) hypertension: Secondary | ICD-10-CM | POA: Diagnosis not present

## 2020-11-18 DIAGNOSIS — D62 Acute posthemorrhagic anemia: Secondary | ICD-10-CM | POA: Diagnosis not present

## 2020-11-18 DIAGNOSIS — Z978 Presence of other specified devices: Secondary | ICD-10-CM | POA: Diagnosis not present

## 2020-11-18 LAB — CBC WITH DIFFERENTIAL/PLATELET
Abs Immature Granulocytes: 0.03 10*3/uL (ref 0.00–0.07)
Basophils Absolute: 0 10*3/uL (ref 0.0–0.1)
Basophils Relative: 1 %
Eosinophils Absolute: 0.8 10*3/uL — ABNORMAL HIGH (ref 0.0–0.5)
Eosinophils Relative: 11 %
HCT: 28.1 % — ABNORMAL LOW (ref 39.0–52.0)
Hemoglobin: 8.4 g/dL — ABNORMAL LOW (ref 13.0–17.0)
Immature Granulocytes: 0 %
Lymphocytes Relative: 14 %
Lymphs Abs: 1.1 10*3/uL (ref 0.7–4.0)
MCH: 26.4 pg (ref 26.0–34.0)
MCHC: 29.9 g/dL — ABNORMAL LOW (ref 30.0–36.0)
MCV: 88.4 fL (ref 80.0–100.0)
Monocytes Absolute: 1 10*3/uL (ref 0.1–1.0)
Monocytes Relative: 13 %
Neutro Abs: 4.8 10*3/uL (ref 1.7–7.7)
Neutrophils Relative %: 61 %
Platelets: 164 10*3/uL (ref 150–400)
RBC: 3.18 MIL/uL — ABNORMAL LOW (ref 4.22–5.81)
RDW: 17.6 % — ABNORMAL HIGH (ref 11.5–15.5)
WBC: 7.8 10*3/uL (ref 4.0–10.5)
nRBC: 0 % (ref 0.0–0.2)

## 2020-11-18 LAB — RESP PANEL BY RT-PCR (FLU A&B, COVID) ARPGX2
Influenza A by PCR: NEGATIVE
Influenza B by PCR: NEGATIVE
SARS Coronavirus 2 by RT PCR: NEGATIVE

## 2020-11-18 LAB — RENAL FUNCTION PANEL
Albumin: 2.5 g/dL — ABNORMAL LOW (ref 3.5–5.0)
Anion gap: 4 — ABNORMAL LOW (ref 5–15)
BUN: 24 mg/dL — ABNORMAL HIGH (ref 8–23)
CO2: 30 mmol/L (ref 22–32)
Calcium: 8.5 mg/dL — ABNORMAL LOW (ref 8.9–10.3)
Chloride: 105 mmol/L (ref 98–111)
Creatinine, Ser: 0.36 mg/dL — ABNORMAL LOW (ref 0.61–1.24)
GFR, Estimated: >60 mL/min (ref >60)
Glucose, Bld: 119 mg/dL — ABNORMAL HIGH (ref 70–99)
Phosphorus: 2.9 mg/dL (ref 2.5–4.6)
Potassium: 3.8 mmol/L (ref 3.5–5.1)
Sodium: 139 mmol/L (ref 135–145)

## 2020-11-18 LAB — MAGNESIUM: Magnesium: 1.9 mg/dL (ref 1.7–2.4)

## 2020-11-18 MED ORDER — MIDODRINE HCL 10 MG PO TABS: 10.0000 mg | ORAL_TABLET | Freq: Three times a day (TID) | ORAL | 1 refills | Status: AC

## 2020-11-18 MED ORDER — SODIUM CHLORIDE 0.9% FLUSH: 10.0000 mL | INTRAVENOUS | Status: DC | PRN

## 2020-11-18 MED ORDER — SODIUM CHLORIDE 0.9% FLUSH
10.0000 mL | Freq: Two times a day (BID) | INTRAVENOUS | Status: DC
  Administered 2020-11-18: 30 mL

## 2020-11-18 MED ORDER — JUVEN PO PACK: 1.0000 | PACK | Freq: Two times a day (BID) | ORAL | 0 refills | Status: AC

## 2020-11-18 MED ORDER — ASPIRIN 81 MG PO CHEW: 81.0000 mg | CHEWABLE_TABLET | Freq: Every morning | ORAL | Status: DC

## 2020-11-18 MED ORDER — OSMOLITE 1.5 CAL PO LIQD: 1000.0000 mL | ORAL | 0 refills | Status: AC

## 2020-11-18 MED ORDER — PROSOURCE TF PO LIQD: 45.0000 mL | Freq: Two times a day (BID) | ORAL | Status: DC

## 2020-11-18 MED ORDER — OXYCODONE HCL 5 MG PO TABS: 2.5000 mg | ORAL_TABLET | Freq: Two times a day (BID) | ORAL | 0 refills | Status: DC | PRN

## 2020-11-18 MED ORDER — PANTOPRAZOLE SODIUM 40 MG PO PACK: 40.0000 mg | PACK | Freq: Every day | ORAL | 1 refills | Status: DC

## 2020-11-18 MED ORDER — OXYCODONE-ACETAMINOPHEN 5-325 MG PO TABS: 1.0000 | ORAL_TABLET | Freq: Two times a day (BID) | ORAL | 0 refills | Status: DC

## 2020-11-18 NOTE — Progress Notes (Signed)
Report called to [facility], given to [employer].  RN will continue to carefully monitor until discharge.

## 2020-11-18 NOTE — Discharge Summary (Signed)
Physician Discharge Summary  [name] ZOX:[id number] DOB: [birth year] DOA: 11/12/2020  PCP: [name], MD  Admit date: 11/12/2020 Discharge date: 11/18/2020  Time spent: 55 minutes  Recommendations for Outpatient Follow-up:  1. Follow-up with MD at skilled nursing facility.  Patient will need a CBC done to follow-up on H&H in 1 week.  Patient also need a basic metabolic profile done to follow-up on electrolytes and renal function. 2. Follow-up with [facility] in 2 weeks.   Discharge Diagnoses:  Principal Problem:   Hemorrhagic shock (HCC) Active Problems:   Quadriplegia and quadriparesis (HCC)   Acute blood loss anemia   Chronic indwelling Foley catheter   Protein-calorie malnutrition, severe   Gross hematuria   Stage IV pressure ulcer of sacral region (HCC)   Anemia   Scrotal edema   Discharge Condition: Stable and improved  Diet recommendation: Dysphagia 3 diet/tube feeds  Filed Weights   11/13/20 0247  Weight: 89.4 kg    History of present illness:  HPI per Dr. 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  Patient underwent Foley catheter placement earlier in the day on 5/22.  Upon attempting Foley catheter placement patient was not exhibiting any urine output.  When this Foley catheter was removed patient suddenly began to exhibit rapid gross hematuria.  Patient reports some associated abdominal pain but has not detailing his symptoms further.    EMS was contacted and the patient was promptly brought into [facility] emergency department for  evaluation.  Upon evaluation in the emergency department patient was found to have ongoing gross hematuria.  CT imaging of the abdomen and pelvis revealed a moderate amount of blood in the bladder with bilateral subcentimeter nonobstructing renal calculi.  A 22 French three-way Foley catheter was then placed and continuous bladder irrigation was initiated.  Dr. Arita Miss with urology was then contacted who recommended manual bladder irrigation however patient was unable to tolerate this and therefore Dr. Arita Miss promptly came in and took the patient to the operating room for evacuation of clots and fulguration of the bladder. The hospitalist group has additionally been called to assess the patient for admission to the hospital.  Hospital Course:  1 hemorrhagic shock secondary to massive hematuria/chronic urinary retention status post chronic indwelling Foley catheter -Patient had presented with a hemorrhagic shock secondary to massive hematuria noted. -Patient was on pressors which were subsequently weaned off.   -Patient maintained on home regimen midodrine.   -Patient is status post transfusion of 6 units packed red blood cells during this hospitalization.   -Blood pressure noted to be somewhat borderline and patient placed on IV albumin every 6 hours x24 hours with improvement with systolic blood pressures.  -2D echo with preserved EF. -Hemoglobin  stabilized at 8.4 by day of discharge.   -Patient had no further hematuria noted and urine had cleared up in Foley catheter.   -Patient was discharged back to skilled nursing facility with hospice following.  2.  Quadriplegia complicated by large stage IV sacral decubitus ulcer -Patient seen by wound care RN. -No purulence or fluctuation noted at wound site. -Wound care recommended by wound care nurse sutures done during the hospitalization in addition  to frequent turning.   3.  Swallow dysfunction/severe protein calorie malnutrition -Patient  maintained on tube feeds during the hospitalization and was seen by dietitian.  -Patient received IV albumin every 6 hours x24 hours.  -Patient seen by speech therapy evaluated and placed on a dysphagia 3 diet which he tolerated.    4.  Depression -Patient maintained on home regimen of Cymbalta.    5.  Iron deficiency anemia -Hemoglobin  stabilized at 8.4 by day of discharge.   -Outpatient follow-up.   6.  Pressure Injury 06/20/20 Vertebral column Lower;Medial Stage 2 -  Partial thickness loss of dermis presenting as a shallow open injury with a red, pink wound bed without slough. (Active)  06/20/20 0130  Location: Vertebral column  Location Orientation: Lower;Medial  Staging: Stage 2 -  Partial thickness loss of dermis presenting as a shallow open injury with a red, pink wound bed without slough.  Wound Description (Comments):   Present on Admission: Yes     Pressure Injury 06/22/20 Sacrum Posterior Stage 4 - Full thickness tissue loss with exposed bone, tendon or muscle. PHYSICAL THERAPY/HYDROTHERAPY (Active)  06/22/20 1400  Location: Sacrum  Location Orientation: Posterior  Staging: Stage 4 - Full thickness tissue loss with exposed bone, tendon or muscle.  Wound Description (Comments): PHYSICAL THERAPY/HYDROTHERAPY  Present on Admission: Yes    7.  Bilateral upper extremity and lower extremity edema/Volume overload/scrotal edema -Likely secondary to aggressive fluid resuscitation on presentation in the setting of hypoalbuminemia. -Albumin of 2.1. -Patient noted to be +6.4 L during his hospitalization. -IVF have been saline locked.   -Patient placed on IV albumin every 6 hours x24 hours and given a dose of Lasix 20 mg IV x1 (11/16/2020) with urine output of 3.5 L over the past 24 hours. -Patient given another dose of Lasix 20 mg IV x1(11/17/2020) with urine output of 4.9 L. -Volume overload or bilateral upper extremity lower extremity edema as well as scrotal edema improved  significantly by day of discharge. -Patient be discharged back to skilled nursing facility in stable and improved condition.       Procedures:  CT renal stone protocol 11/12/2020  2D echo 11/13/2020  Cystourethroscopy, evacuation of blood clot, fulguration per Dr. Arita Miss 11/13/2020  Transfusion of 6 units PRBCs 11/13/2020-11/14/2020    Consultations:  Urology: Dr. Arita Miss 11/12/2020  Wound care: Janit Pagan, RN 11/13/2020    Discharge Exam: Vitals:   11/18/20 1000 11/18/20 1122  BP: (!) 113/56   Pulse: 66   Resp: 12   Temp:  98.2 F (36.8 C)  SpO2: 98%     General: NAD. Cardiovascular: RRR Respiratory: CTA B anterior lung fields. GU: Significantly improved scrotal edema. Extremities: Trace to 1+ bilateral upper extremity and lower extremity edema.  Discharge Instructions   Discharge Instructions    Diet general   Complete by: As directed    DYSPHAGIA 3 DIET   Discharge wound care:   Complete by: As directed    AS ABOVE   Increase activity slowly   Complete by: As directed      Allergies as of 11/18/2020      Reactions   Iodine Swelling   Penicillins Swelling   ** tolerates cephalosporins Facial swelling, itchy throat      Medication List    STOP taking these medications   ertapenem  IVPB Commonly known as: INVANZ   vancomycin  IVPB     TAKE these medications   acetaminophen 500 MG tablet Commonly known as: TYLENOL  Take 1,000 mg by mouth in the morning and at bedtime.   albuterol 108 (90 Base) MCG/ACT inhaler Commonly known as: VENTOLIN HFA Inhale 2 puffs into the lungs daily.   AMINO ACIDS-PROTEIN HYDROLYS PO Take by mouth 2 (two) times daily between meals. 17-100 gram/ Kcal/30 mL   (Also known as Pro -Stat)   ascorbic acid 500 MG tablet Commonly known as: VITAMIN C Take 1,000 mg by mouth daily.   aspirin 81 MG chewable tablet Chew 1 tablet (81 mg total) by mouth every morning. Start taking on: December 02, 2020 What changed: These  instructions start on December 02, 2020. If you are unsure what to do until then, ask your doctor or other care provider.   Cranberry 400 MG Caps Take 1 capsule by mouth daily at 6 (six) AM.   docusate 50 MG/5ML liquid Commonly known as: COLACE Place 100 mg into feeding tube 2 (two) times daily.   DULoxetine 30 MG capsule Commonly known as: CYMBALTA Take 30 mg by mouth daily.   feeding supplement (PROSource TF) liquid Place 45 mLs into feeding tube 2 (two) times daily.   feeding supplement (OSMOLITE 1.5 CAL) Liqd Place 1,000 mLs into feeding tube continuous.   nutrition supplement (JUVEN) Pack Place 1 packet into feeding tube 2 (two) times daily between meals.   Ferrous Sulfate 220 (44 Fe) MG/5ML Liqd Give 5 mLs by tube in the morning, at noon, and at bedtime. Via G tube   midodrine 10 MG tablet Commonly known as: PROAMATINE Place 1 tablet (10 mg total) into feeding tube 3 (three) times daily. What changed:   how to take this  when to take this   omeprazole 20 MG capsule Commonly known as: PRILOSEC Take 20 mg by mouth every other day. Per Tube   oxyCODONE 5 MG immediate release tablet Commonly known as: Oxy IR/ROXICODONE Take 0.5 tablets (2.5 mg total) by mouth 2 (two) times daily as needed for moderate pain or severe pain.   oxyCODONE-acetaminophen 5-325 MG tablet Commonly known as: PERCOCET/ROXICET Place 1 tablet into feeding tube 2 (two) times daily. What changed: how to take this   pantoprazole sodium 40 mg/20 mL Pack Commonly known as: PROTONIX Place 20 mLs (40 mg total) into feeding tube daily. Start taking on: Nov 19, 2020   polyethylene glycol 17 g packet Commonly known as: MIRALAX / GLYCOLAX Place 17 g into feeding tube daily.   pregabalin 100 MG capsule Commonly known as: LYRICA Take 100 mg by mouth 2 (two) times daily.   Probiotic Acidophilus Caps Place 1 capsule into feeding tube daily.   scopolamine 1 MG/3DAYS Commonly known as:  TRANSDERM-SCOP Place 1 patch onto the skin every 3 (three) days.   tamsulosin 0.4 MG Caps capsule Commonly known as: FLOMAX Take 1 capsule (0.4 mg total) by mouth daily.   ZINC PO Place 220 mg into feeding tube daily.            Discharge Care Instructions  (From admission, onward)         Start     Ordered   11/18/20 0000  Discharge wound care:       Comments: AS ABOVE   11/18/20 1133         Allergies  Allergen Reactions  . Iodine Swelling  . Penicillins Swelling    ** tolerates cephalosporins Facial swelling, itchy throat    Follow-up Information    [facility]. Schedule an appointment as soon as possible for a visit  in 2 week(s).   Contact information: [address] [place] [address] [phone]       MD AT SNF Follow up.                The results of significant diagnostics from this hospitalization (including imaging, microbiology, ancillary and laboratory) are listed below for reference.    Significant Diagnostic Studies: ECHOCARDIOGRAM COMPLETE  Result Date: 11/13/2020    ECHOCARDIOGRAM REPORT   Patient Name:   Shannon [name] Date of Exam: 11/13/2020 Medical Rec #:  [id number]        Height:       67.0 in Accession #:    [id number]       Weight:       197.1 lb Date of Birth:  [birth year]       BSA:          2.010 m Patient Age:    74 years         BP:           125/47 mmHg Patient Gender: M                HR:           78 bpm. Exam Location:  Inpatient Procedure: 2D Echo, Cardiac Doppler and Color Doppler Indications:    Hypotension  History:        Patient has no prior history of Echocardiogram examinations.                 Quadriplegia. Edema.  Sonographer:    Sheralyn Boatman RDCS Referring Phys: [id number] [facility] ARRIEN  Sonographer Comments: Technically difficult study due to poor echo windows. Suboptimal machine. Feeding tube in subcostal window. IMPRESSIONS  1. Left ventricular ejection fraction, by  estimation, is 60 to 65%. The left ventricle has normal function. The left ventricle has no regional wall motion abnormalities. There is moderate left ventricular hypertrophy. Left ventricular diastolic parameters are consistent with Grade I diastolic dysfunction (impaired relaxation).  2. Right ventricular systolic function is normal. The right ventricular size is normal. Tricuspid regurgitation signal is inadequate for assessing PA pressure.  3. The mitral valve is normal in structure. Trivial mitral valve regurgitation. No evidence of mitral stenosis.  4. The aortic valve is tricuspid. Aortic valve regurgitation is not visualized. Mild to moderate aortic valve sclerosis/calcification is present, without any evidence of aortic stenosis.  5. Aortic dilatation noted. There is moderate dilatation of the ascending aorta, measuring 46 mm.  6. IVC not visualized. FINDINGS  Left Ventricle: Left ventricular ejection fraction, by estimation, is 60 to 65%. The left ventricle has normal function. The left ventricle has no regional wall motion abnormalities. The left ventricular internal cavity size was normal in size. There is  moderate left ventricular hypertrophy. Left ventricular diastolic parameters are consistent with Grade I diastolic dysfunction (impaired relaxation). Right Ventricle: The right ventricular size is normal. No increase in right ventricular wall thickness. Right ventricular systolic function is normal. Tricuspid regurgitation signal is inadequate for assessing PA pressure. Left Atrium: Left atrial size was normal in size. Right Atrium: Right atrial size was normal in size. Pericardium: There is no evidence of pericardial effusion. Mitral Valve: The mitral valve is normal in structure. There is mild calcification of the mitral valve leaflet(s). Trivial mitral valve regurgitation. No evidence of mitral valve stenosis. Tricuspid Valve: The tricuspid valve is normal in structure. Tricuspid valve  regurgitation is trivial. Aortic Valve: The aortic  valve is tricuspid. Aortic valve regurgitation is not visualized. Mild to moderate aortic valve sclerosis/calcification is present, without any evidence of aortic stenosis. Pulmonic Valve: The pulmonic valve was normal in structure. Pulmonic valve regurgitation is trivial. Aorta: Aortic dilatation noted. There is moderate dilatation of the ascending aorta, measuring 46 mm. Venous: The inferior vena cava was not well visualized. IAS/Shunts: No atrial level shunt detected by color flow Doppler.  LEFT VENTRICLE PLAX 2D LVIDd:         4.52 cm      Diastology LVIDs:         2.59 cm      LV e' medial:    0.07 cm/s LV PW:         1.67 cm      LV E/e' medial:  13.2 LV IVS:        1.64 cm      LV e' lateral:   0.07 cm/s LVOT diam:     2.20 cm      LV E/e' lateral: 14.7 LV SV:         82 LV SV Index:   41 LVOT Area:     3.80 cm  LV Volumes (MOD) LV vol d, MOD A2C: 118.0 ml LV vol d, MOD A4C: 104.0 ml LV vol s, MOD A2C: 48.0 ml LV vol s, MOD A4C: 47.2 ml LV SV MOD A2C:     70.0 ml LV SV MOD A4C:     104.0 ml LV SV MOD BP:      65.4 ml RIGHT VENTRICLE RV S prime:     16.50 cm/s TAPSE (M-mode): 2.7 cm LEFT ATRIUM             Index       RIGHT ATRIUM           Index LA diam:        3.80 cm 1.89 cm/m  RA Area:     12.70 cm LA Vol (A2C):   48.9 ml 24.33 ml/m RA Volume:   25.00 ml  12.44 ml/m LA Vol (A4C):   46.4 ml 23.09 ml/m LA Biplane Vol: 48.0 ml 23.88 ml/m  AORTIC VALVE LVOT Vmax:   121.00 cm/s LVOT Vmean:  84.300 cm/s LVOT VTI:    0.216 m  AORTA Ao Root diam: 4.10 cm Ao Asc diam:  4.30 cm MITRAL VALVE MV Area (PHT): 3.91 cm    SHUNTS MV Decel Time: 194 msec    Systemic VTI:  0.22 m MV E velocity: 0.96 cm/s   Systemic Diam: 2.20 cm MV A velocity: 93.10 cm/s MV E/A ratio:  0.01 Marca Anconaalton Mclean MD Electronically signed by Marca Anconaalton Mclean MD Signature Date/Time: 11/13/2020/8:23:01 PM    Final    CT Renal Stone Study  Result Date: 11/12/2020 CLINICAL DATA:  Hematuria  following Foley catheter replacement. EXAM: CT ABDOMEN AND PELVIS WITHOUT CONTRAST TECHNIQUE: Multidetector CT imaging of the abdomen and pelvis was performed following the standard protocol without IV contrast. COMPARISON:  June 19, 2020 FINDINGS: Lower chest: There is elevation of the right hemidiaphragm with moderate severity atelectasis and/or infiltrate seen within the posterior aspect of the bilateral lung bases. A small left pleural effusion is seen. Hepatobiliary: No focal liver abnormality is seen. A 1.0 cm gallstone is seen within the lumen of an otherwise normal-appearing gallbladder. Pancreas: Unremarkable. No pancreatic ductal dilatation or surrounding inflammatory changes. Spleen: Normal in size without focal abnormality. Adrenals/Urinary Tract: Adrenal glands are unremarkable. Kidneys are normal in size,  bilaterally. A 4 mm nonobstructing renal stone is seen within the lateral aspect of the mid right kidney. 3 mm nonobstructing renal stones are seen within the posterior aspect of the mid left kidney. There is mild to moderate severity bilateral hydronephrosis and hydroureter, without evidence of obstructing renal stones. An 8 mm calcification is seen within the posterolateral wall of the urinary bladder on the left (axial CT image 84, CT series number 2). This is adjacent to the left UVJ. A Foley catheter, a moderate amount of mildly increased attenuation (approximately 68.67 Hounsfield units) and a mild amount of air are seen within the bladder lumen. Stomach/Bowel: A percutaneous gastrostomy tube is in place. Its distal tip and insufflator bulb are seen within the body of the stomach. Appendix appears normal. No evidence of bowel dilatation. Noninflamed diverticula are seen within the large bowel. Vascular/Lymphatic: Aortic atherosclerosis. No enlarged abdominal or pelvic lymph nodes. Reproductive: There is mild to moderate severity prostate gland enlargement. Other: Mild to moderate severity  anasarca is seen throughout the abdominal and pelvic walls. No abdominopelvic ascites. Musculoskeletal: Degenerative changes seen throughout the lumbar spine. IMPRESSION: 1. Moderate amount of acute blood products within the urinary bladder lumen, likely secondary to sequelae associated with the patient's recent Foley catheter replacement. 2. Bilateral subcentimeter nonobstructing renal calculi. 3. Moderate severity bibasilar atelectasis and/or infiltrate. 4. Cholelithiasis. 5. Colonic diverticulosis. Electronically Signed   By: Aram Candela M.D.   On: 11/12/2020 23:29    Microbiology: Recent Results (from the past 240 hour(s))  Urine Culture     Status: Abnormal   Collection Time: 11/12/20  8:00 PM   Specimen: Urine, Random  Result Value Ref Range Status   Specimen Description   Final    URINE, RANDOM Performed at [facility], [address] [place], [place] [address]    Special Requests   Final    NONE Performed at [facility], [address], [place], [place] [address]    Culture 20,000 COLONIES/mL PSEUDOMONAS AERUGINOSA (A)  Final   Report Status 11/15/2020 FINAL  Final   Organism ID, Bacteria PSEUDOMONAS AERUGINOSA (A)  Final      Susceptibility   Pseudomonas aeruginosa - MIC*    CEFTAZIDIME 4 SENSITIVE Sensitive     CIPROFLOXACIN <=0.25 SENSITIVE Sensitive     GENTAMICIN <=1 SENSITIVE Sensitive     IMIPENEM 2 SENSITIVE Sensitive     PIP/TAZO 8 SENSITIVE Sensitive     CEFEPIME 2 SENSITIVE Sensitive     * 20,000 COLONIES/mL PSEUDOMONAS AERUGINOSA  Resp Panel by RT-PCR (Flu A&B, Covid) Nasopharyngeal Swab     Status: None   Collection Time: 11/12/20 11:23 PM   Specimen: Nasopharyngeal Swab; Nasopharyngeal(NP) swabs in vial transport medium  Result Value Ref Range Status   SARS Coronavirus 2 by RT PCR NEGATIVE NEGATIVE Final    Comment: (NOTE) SARS-CoV-2 target nucleic acids are NOT DETECTED.  The SARS-CoV-2 RNA is generally  detectable in upper respiratory specimens during the acute phase of infection. The lowest concentration of SARS-CoV-2 viral copies this assay can detect is 138 copies/mL. A negative result does not preclude SARS-Cov-2 infection and should not be used as the sole basis for treatment or other patient management decisions. A negative result may occur with  improper specimen collection/handling, submission of specimen other than nasopharyngeal swab, presence of viral mutation(s) within the areas targeted by this assay, and inadequate number of viral copies(<138 copies/mL). A negative result must be combined with clinical observations, patient history,  and epidemiological information. The expected result is Negative.  Fact Sheet for Patients:  BloggerCourse.com  Fact Sheet for Healthcare Providers:  SeriousBroker.it  This test is no t yet approved or cleared by the [place] FDA and  has been authorized for detection and/or diagnosis of SARS-CoV-2 by FDA under an Emergency Use Authorization (EUA). This EUA will remain  in effect (meaning this test can be used) for the duration of the COVID-19 declaration under Section 564(b)(1) of the Act, 21 U.S.C.section 360bbb-3(b)(1), unless the authorization is terminated  or revoked sooner.       Influenza A by PCR NEGATIVE NEGATIVE Final   Influenza B by PCR NEGATIVE NEGATIVE Final    Comment: (NOTE) The Xpert Xpress SARS-CoV-2/FLU/RSV plus assay is intended as an aid in the diagnosis of influenza from Nasopharyngeal swab specimens and should not be used as a sole basis for treatment. Nasal washings and aspirates are unacceptable for Xpert Xpress SARS-CoV-2/FLU/RSV testing.  Fact Sheet for Patients: BloggerCourse.com  Fact Sheet for Healthcare Providers: SeriousBroker.it  This test is not yet approved or cleared by the [place] FDA  and has been authorized for detection and/or diagnosis of SARS-CoV-2 by FDA under an Emergency Use Authorization (EUA). This EUA will remain in effect (meaning this test can be used) for the duration of the COVID-19 declaration under Section 564(b)(1) of the Act, 21 U.S.C. section 360bbb-3(b)(1), unless the authorization is terminated or revoked.  Performed at [facility], [address], [place], [place] [address]   MRSA PCR Screening     Status: Abnormal   Collection Time: 11/13/20  2:10 AM   Specimen: Nasal Mucosa; Nasopharyngeal  Result Value Ref Range Status   MRSA by PCR POSITIVE (A) NEGATIVE Final    Comment:        The GeneXpert MRSA Assay (FDA approved for NASAL specimens only), is one component of a comprehensive MRSA colonization surveillance program. It is not intended to diagnose MRSA infection nor to guide or monitor treatment for MRSA infections. RESULT CALLED TO, READ BACK BY AND VERIFIED WITH: STACEY WEAVER AT 0407 ON 11/13/20 BY Katha Hamming Performed at [facility], [address], [place], [place] [address]      Labs: Basic Metabolic Panel: Recent Labs  Lab 11/14/20 0900 11/15/20 0236 11/16/20 0234 11/17/20 0407 11/18/20 0430  NA 139 139 139 140 139  K 3.6 3.6 3.9 3.7 3.8  CL 107 106 107 108 105  CO2 GLUCOSE 149* 126* 126* 111* 119*  BUN 27* 29* 28* 29* 24*  CREATININE 0.46* 0.47* 0.42* 0.35* 0.36*  CALCIUM 7.9* 8.1* 8.0* 8.4* 8.5*  MG  --   --   --   --  1.9  PHOS  --   --   --  2.6 2.9   Liver Function Tests: Recent Labs  Lab 11/12/20 2000 11/17/20 0407 11/18/20 0430  AST 24  --   --   ALT 10  --   --   ALKPHOS 113  --   --   BILITOT 0.5  --   --   PROT 7.9  --   --   ALBUMIN 2.1* 2.5* 2.5*   No results for input(s): LIPASE, AMYLASE in the last 168 hours. No results for input(s): AMMONIA in the last 168 hours. CBC: Recent Labs  Lab 11/13/20 1030 11/13/20 1123 11/14/20 0900  11/14/20 1845 11/15/20 0236 11/16/20 0234 11/17/20 0407 11/18/20 0430  WBC 11.4*  --  10.2  --  8.9 9.2 7.4 7.8  NEUTROABS 7.7  --  6.7  --  5.4 6.1  --  4.8  HGB 5.5*   < > 7.2* 8.6* 8.5* 8.8* 7.9* 8.4*  HCT 17.7*   < > 22.8* 27.0* 27.0* 28.1* 25.9* 28.1*  MCV 81.6  --  84.1  --  86.3 88.1 89.0 88.4  PLT 164  --  130*  --  128* 151 133* 164   < > = values in this interval not displayed.   Cardiac Enzymes: No results for input(s): CKTOTAL, CKMB, CKMBINDEX, TROPONINI in the last 168 hours. BNP: BNP (last 3 results) Recent Labs    05/23/20 0432 05/24/20 0127 06/30/20 1000  BNP 35.1 32.0 35.9    ProBNP (last 3 results) No results for input(s): PROBNP in the last 8760 hours.  CBG: No results for input(s): GLUCAP in the last 168 hours.     Signed:  Ramiro Harvest MD.  [facility] 11/18/2020, 11:45 AM

## 2020-11-18 NOTE — Progress Notes (Signed)
RN spoke with nurse, Matilde Sprang at [facility] about leaving PICC in place.  This RN was advised to leave PICC in place for receiving facility to use.  RN will continue to carefully monitor until discharge.

## 2020-11-18 NOTE — TOC Transition Note (Signed)
Transition of Care [facility]) - CM/SW Discharge Note   Patient Details  Name: Shannon [name] MRN: [id number] Date of Birth: 1945-07-28  Transition of Care [facility]) CM/SW Contact:  Villa Herb, LCSWA Phone Number: 11/18/2020, 10:34 AM   Clinical Narrative:    CSW spoke with admissions at [facility] who states that pt can return today if ready for d/c. CSW spoke to Haynes Bast with [facility] who states they are also set up for pt to go back to [facility]. CSW to send D/C summary in hub and call admissions when pt is ready. CSW updated pts daughter that he will likely d/c back to facility today, she is understanding. CSW updated Attending that pt can return to facility today. Once d/c summary is completed/sent and COVID test resulted, transportation will be set up back to facility. TOC signing off.    Final next level of care: Long Term Nursing Home Barriers to Discharge: Barriers Resolved   Patient Goals and CMS Choice Patient states their goals for this hospitalization and ongoing recovery are:: Go back to [email or web address] Compare Post Acute Care list provided to:: Patient Choice offered to / list presented to : Patient  Discharge Placement                Patient to be transferred to facility by: EMS Name of family member notified: Amado Coe Patient and family notified of of transfer: 11/18/20  Discharge Plan and Services   Discharge Planning Services: CM Consult            DME Arranged: N/A DME Agency: NA       HH Arranged: NA HH Agency: NA        Social Determinants of Health (SDOH) Interventions     Readmission Risk Interventions No flowsheet data found.

## 2020-12-08 ENCOUNTER — Emergency Department (HOSPITAL_COMMUNITY)

## 2020-12-08 ENCOUNTER — Other Ambulatory Visit: Payer: Self-pay

## 2020-12-08 ENCOUNTER — Inpatient Hospital Stay (HOSPITAL_COMMUNITY)
Admission: EM | Admit: 2020-12-08 | Discharge: 2020-12-21 | DRG: 871 | Disposition: A | Discharge status: Skilled Nursing Facility | Source: Skilled Nursing Facility | Attending: Family Medicine | Admitting: Family Medicine

## 2020-12-08 ENCOUNTER — Encounter (HOSPITAL_COMMUNITY): Payer: Self-pay

## 2020-12-08 DIAGNOSIS — I110 Hypertensive heart disease with heart failure: Secondary | ICD-10-CM | POA: Diagnosis present

## 2020-12-08 DIAGNOSIS — G20 Parkinson's disease: Secondary | ICD-10-CM | POA: Diagnosis present

## 2020-12-08 DIAGNOSIS — E871 Hypo-osmolality and hyponatremia: Secondary | ICD-10-CM | POA: Diagnosis present

## 2020-12-08 DIAGNOSIS — R778 Other specified abnormalities of plasma proteins: Secondary | ICD-10-CM | POA: Diagnosis not present

## 2020-12-08 DIAGNOSIS — Z6830 Body mass index (BMI) 30.0-30.9, adult: Secondary | ICD-10-CM

## 2020-12-08 DIAGNOSIS — Z87891 Personal history of nicotine dependence: Secondary | ICD-10-CM

## 2020-12-08 DIAGNOSIS — Z20822 Contact with and (suspected) exposure to covid-19: Secondary | ICD-10-CM | POA: Diagnosis present

## 2020-12-08 DIAGNOSIS — D7582 Heparin induced thrombocytopenia (HIT): Secondary | ICD-10-CM | POA: Diagnosis not present

## 2020-12-08 DIAGNOSIS — G935 Compression of brain: Secondary | ICD-10-CM | POA: Diagnosis present

## 2020-12-08 DIAGNOSIS — E43 Unspecified severe protein-calorie malnutrition: Secondary | ICD-10-CM | POA: Diagnosis present

## 2020-12-08 DIAGNOSIS — J189 Pneumonia, unspecified organism: Secondary | ICD-10-CM | POA: Diagnosis present

## 2020-12-08 DIAGNOSIS — Z87442 Personal history of urinary calculi: Secondary | ICD-10-CM

## 2020-12-08 DIAGNOSIS — Z978 Presence of other specified devices: Secondary | ICD-10-CM

## 2020-12-08 DIAGNOSIS — E876 Hypokalemia: Secondary | ICD-10-CM | POA: Diagnosis present

## 2020-12-08 DIAGNOSIS — I509 Heart failure, unspecified: Secondary | ICD-10-CM | POA: Diagnosis present

## 2020-12-08 DIAGNOSIS — Z7401 Bed confinement status: Secondary | ICD-10-CM

## 2020-12-08 DIAGNOSIS — L89154 Pressure ulcer of sacral region, stage 4: Secondary | ICD-10-CM | POA: Diagnosis present

## 2020-12-08 DIAGNOSIS — R627 Adult failure to thrive: Secondary | ICD-10-CM | POA: Diagnosis present

## 2020-12-08 DIAGNOSIS — R6521 Severe sepsis with septic shock: Secondary | ICD-10-CM | POA: Diagnosis present

## 2020-12-08 DIAGNOSIS — D638 Anemia in other chronic diseases classified elsewhere: Secondary | ICD-10-CM | POA: Diagnosis present

## 2020-12-08 DIAGNOSIS — Z8616 Personal history of COVID-19: Secondary | ICD-10-CM | POA: Diagnosis not present

## 2020-12-08 DIAGNOSIS — A419 Sepsis, unspecified organism: Secondary | ICD-10-CM | POA: Diagnosis present

## 2020-12-08 DIAGNOSIS — R532 Functional quadriplegia: Secondary | ICD-10-CM | POA: Diagnosis present

## 2020-12-08 DIAGNOSIS — L89816 Pressure-induced deep tissue damage of head: Secondary | ICD-10-CM | POA: Diagnosis not present

## 2020-12-08 DIAGNOSIS — Z888 Allergy status to other drugs, medicaments and biological substances status: Secondary | ICD-10-CM

## 2020-12-08 DIAGNOSIS — K5730 Diverticulosis of large intestine without perforation or abscess without bleeding: Secondary | ICD-10-CM | POA: Diagnosis present

## 2020-12-08 DIAGNOSIS — Z79891 Long term (current) use of opiate analgesic: Secondary | ICD-10-CM

## 2020-12-08 DIAGNOSIS — G9341 Metabolic encephalopathy: Secondary | ICD-10-CM | POA: Diagnosis present

## 2020-12-08 DIAGNOSIS — Z7982 Long term (current) use of aspirin: Secondary | ICD-10-CM

## 2020-12-08 DIAGNOSIS — G8929 Other chronic pain: Secondary | ICD-10-CM | POA: Diagnosis present

## 2020-12-08 DIAGNOSIS — M4628 Osteomyelitis of vertebra, sacral and sacrococcygeal region: Secondary | ICD-10-CM | POA: Diagnosis present

## 2020-12-08 DIAGNOSIS — G8250 Quadriplegia, unspecified: Secondary | ICD-10-CM

## 2020-12-08 DIAGNOSIS — E872 Acidosis: Secondary | ICD-10-CM | POA: Diagnosis present

## 2020-12-08 DIAGNOSIS — N200 Calculus of kidney: Secondary | ICD-10-CM | POA: Diagnosis present

## 2020-12-08 DIAGNOSIS — I9589 Other hypotension: Secondary | ICD-10-CM | POA: Diagnosis not present

## 2020-12-08 DIAGNOSIS — R319 Hematuria, unspecified: Secondary | ICD-10-CM

## 2020-12-08 DIAGNOSIS — N319 Neuromuscular dysfunction of bladder, unspecified: Secondary | ICD-10-CM | POA: Diagnosis present

## 2020-12-08 DIAGNOSIS — Z79899 Other long term (current) drug therapy: Secondary | ICD-10-CM

## 2020-12-08 DIAGNOSIS — K7200 Acute and subacute hepatic failure without coma: Secondary | ICD-10-CM | POA: Diagnosis present

## 2020-12-08 DIAGNOSIS — D696 Thrombocytopenia, unspecified: Secondary | ICD-10-CM | POA: Diagnosis present

## 2020-12-08 DIAGNOSIS — Z931 Gastrostomy status: Secondary | ICD-10-CM

## 2020-12-08 DIAGNOSIS — Z981 Arthrodesis status: Secondary | ICD-10-CM

## 2020-12-08 DIAGNOSIS — L89159 Pressure ulcer of sacral region, unspecified stage: Secondary | ICD-10-CM | POA: Diagnosis present

## 2020-12-08 DIAGNOSIS — J9601 Acute respiratory failure with hypoxia: Secondary | ICD-10-CM | POA: Diagnosis present

## 2020-12-08 DIAGNOSIS — R7401 Elevation of levels of liver transaminase levels: Secondary | ICD-10-CM | POA: Diagnosis present

## 2020-12-08 DIAGNOSIS — Z880 Allergy status to penicillin: Secondary | ICD-10-CM

## 2020-12-08 LAB — CBC WITH DIFFERENTIAL/PLATELET
Abs Immature Granulocytes: 0.35 10*3/uL — ABNORMAL HIGH (ref 0.00–0.07)
Basophils Absolute: 0.1 10*3/uL (ref 0.0–0.1)
Basophils Relative: 0 %
Eosinophils Absolute: 0 10*3/uL (ref 0.0–0.5)
Eosinophils Relative: 0 %
HCT: 32.8 % — ABNORMAL LOW (ref 39.0–52.0)
Hemoglobin: 9.6 g/dL — ABNORMAL LOW (ref 13.0–17.0)
Immature Granulocytes: 1 %
Lymphocytes Relative: 1 %
Lymphs Abs: 0.4 10*3/uL — ABNORMAL LOW (ref 0.7–4.0)
MCH: 26.2 pg (ref 26.0–34.0)
MCHC: 29.3 g/dL — ABNORMAL LOW (ref 30.0–36.0)
MCV: 89.4 fL (ref 80.0–100.0)
Monocytes Absolute: 0.3 10*3/uL (ref 0.1–1.0)
Monocytes Relative: 1 %
Neutro Abs: 25.1 10*3/uL — ABNORMAL HIGH (ref 1.7–7.7)
Neutrophils Relative %: 97 %
Platelets: 148 10*3/uL — ABNORMAL LOW (ref 150–400)
RBC: 3.67 MIL/uL — ABNORMAL LOW (ref 4.22–5.81)
RDW: 16.2 % — ABNORMAL HIGH (ref 11.5–15.5)
WBC: 26.2 10*3/uL — ABNORMAL HIGH (ref 4.0–10.5)
nRBC: 0 % (ref 0.0–0.2)

## 2020-12-08 LAB — URINALYSIS, ROUTINE W REFLEX MICROSCOPIC
Bilirubin Urine: NEGATIVE
Glucose, UA: 50 mg/dL — AB
Ketones, ur: NEGATIVE mg/dL
Nitrite: NEGATIVE
Protein, ur: 100 mg/dL — AB
RBC / HPF: >50 RBC/hpf — ABNORMAL HIGH (ref 0–5)
Specific Gravity, Urine: 1.012 (ref 1.005–1.030)
pH: 7 (ref 5.0–8.0)

## 2020-12-08 LAB — COMPREHENSIVE METABOLIC PANEL
ALT: 99 U/L — ABNORMAL HIGH (ref 0–44)
AST: 229 U/L — ABNORMAL HIGH (ref 15–41)
Albumin: 2.2 g/dL — ABNORMAL LOW (ref 3.5–5.0)
Alkaline Phosphatase: 226 U/L — ABNORMAL HIGH (ref 38–126)
Anion gap: 14 (ref 5–15)
BUN: 37 mg/dL — ABNORMAL HIGH (ref 8–23)
CO2: 25 mmol/L (ref 22–32)
Calcium: 8.8 mg/dL — ABNORMAL LOW (ref 8.9–10.3)
Chloride: 100 mmol/L (ref 98–111)
Creatinine, Ser: 1.61 mg/dL — ABNORMAL HIGH (ref 0.61–1.24)
GFR, Estimated: 45 mL/min — ABNORMAL LOW (ref >60)
Glucose, Bld: 97 mg/dL (ref 70–99)
Potassium: 4.1 mmol/L (ref 3.5–5.1)
Sodium: 139 mmol/L (ref 135–145)
Total Bilirubin: 1.5 mg/dL — ABNORMAL HIGH (ref 0.3–1.2)
Total Protein: 7.4 g/dL (ref 6.5–8.1)

## 2020-12-08 LAB — TROPONIN I (HIGH SENSITIVITY)
Troponin I (High Sensitivity): 3292 ng/L (ref <18)
Troponin I (High Sensitivity): 2965 ng/L (ref <18)

## 2020-12-08 LAB — LACTIC ACID, PLASMA
Lactic Acid, Venous: 8.1 mmol/L (ref 0.5–1.9)
Lactic Acid, Venous: 7.3 mmol/L (ref 0.5–1.9)
Lactic Acid, Venous: 9.7 mmol/L (ref 0.5–1.9)

## 2020-12-08 LAB — D-DIMER, QUANTITATIVE: D-Dimer, Quant: >20 ug/mL-FEU — ABNORMAL HIGH (ref 0.00–0.50)

## 2020-12-08 LAB — PROTIME-INR
INR: 1.5 — ABNORMAL HIGH (ref 0.8–1.2)
Prothrombin Time: 18.4 seconds — ABNORMAL HIGH (ref 11.4–15.2)

## 2020-12-08 LAB — CBG MONITORING, ED: Glucose-Capillary: 80 mg/dL (ref 70–99)

## 2020-12-08 LAB — RESP PANEL BY RT-PCR (FLU A&B, COVID) ARPGX2
Influenza A by PCR: NEGATIVE
Influenza B by PCR: NEGATIVE
SARS Coronavirus 2 by RT PCR: NEGATIVE

## 2020-12-08 LAB — APTT: aPTT: 33 seconds (ref 24–36)

## 2020-12-08 LAB — PROCALCITONIN: Procalcitonin: >150 ng/mL

## 2020-12-08 IMAGING — CT CT HEAD W/O CM
2 series · 15 of 37 positions shown, 18 images · non-contrast
Comparison: [DATE]

CLINICAL DATA: Altered mental status

EXAM:
CT HEAD WITHOUT CONTRAST
TECHNIQUE: Contiguous axial images were obtained from the base of the skull
through the vertex without intravenous contrast.

[Series 3: head wo · axial · 0.47mm/px · z∈[-278,-128]mm · 12 of 36 slices shown, 15 images]
[im 3/36  brain]
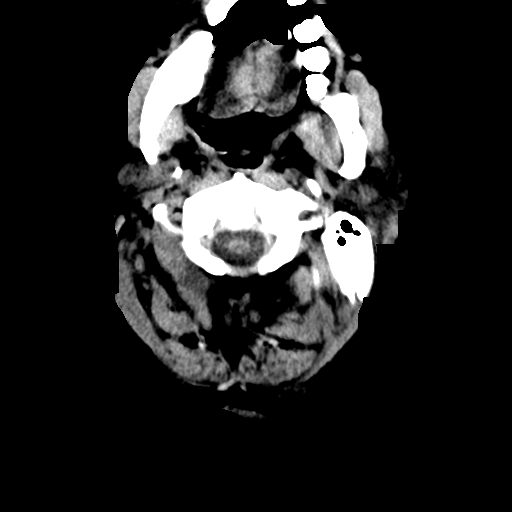
[im 3/36  bone]
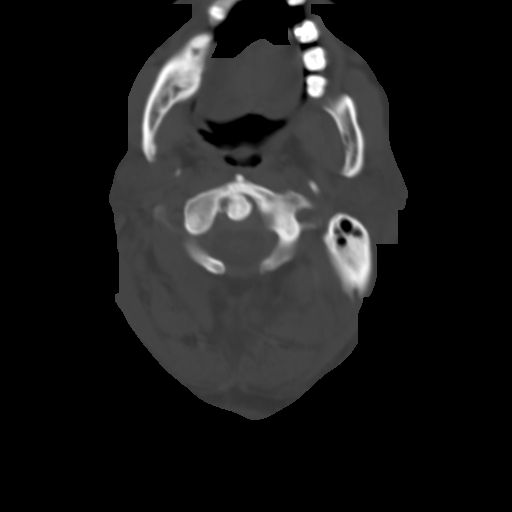
[im 5/36  brain]
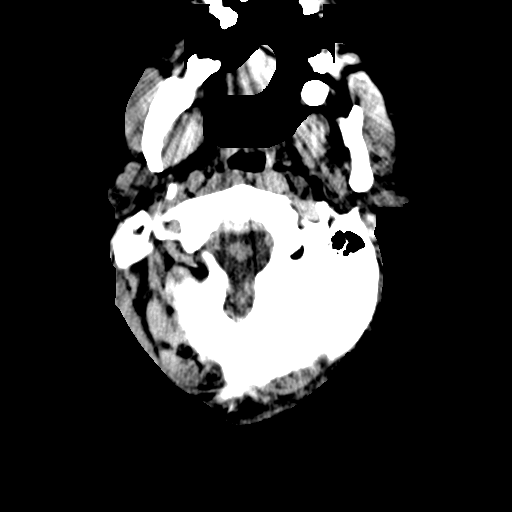
[im 8/36  brain]
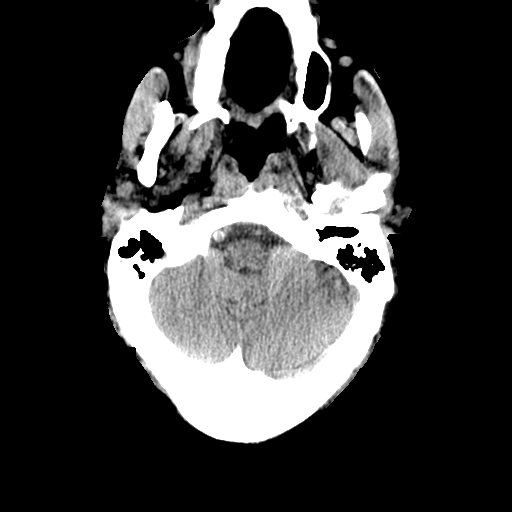
[im 11/36  brain]
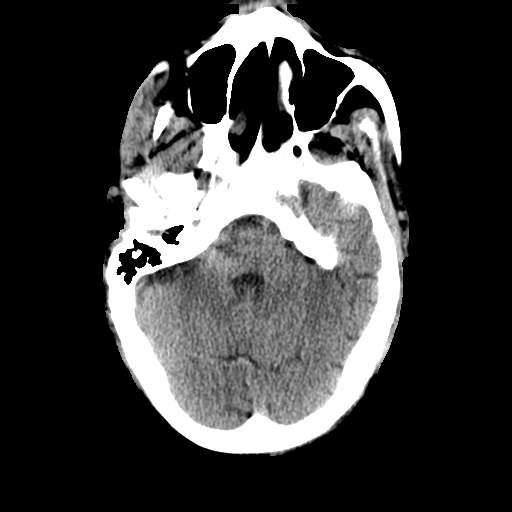
[im 14/36  brain]
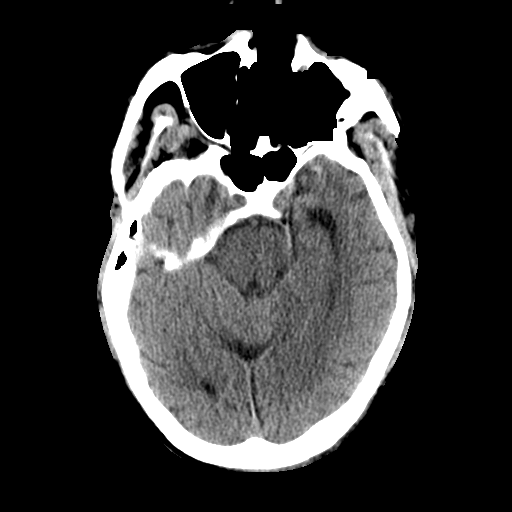
[im 14/36  bone]
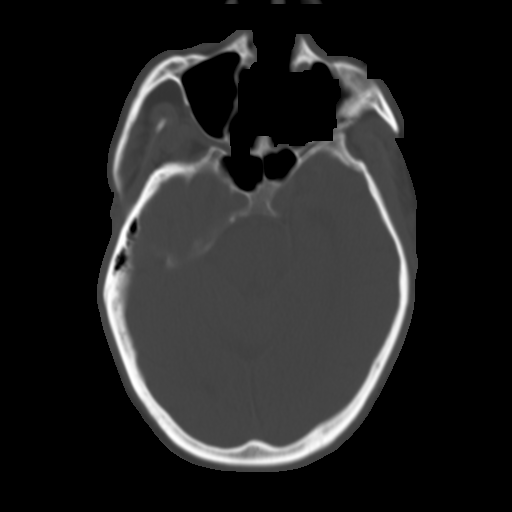
[im 16/36  brain]
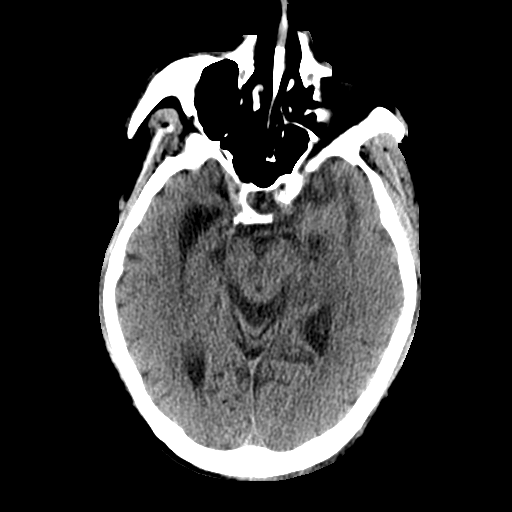
[im 20/36  brain]
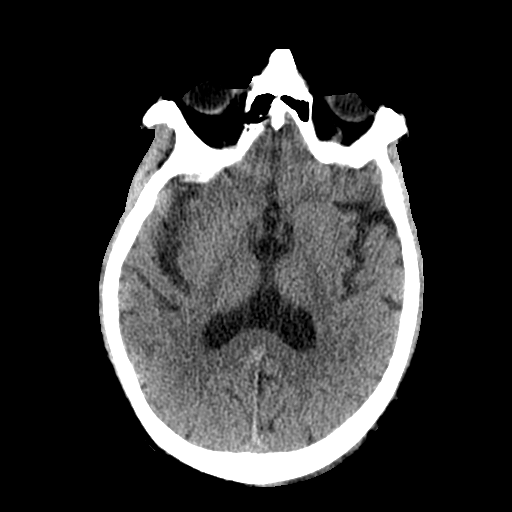
[im 22/36  brain]
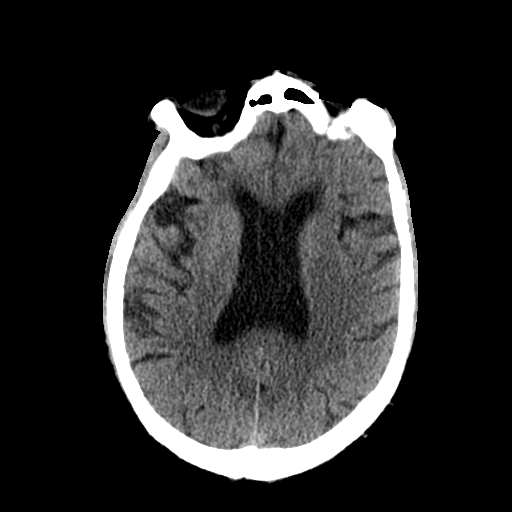
[im 25/36  brain]
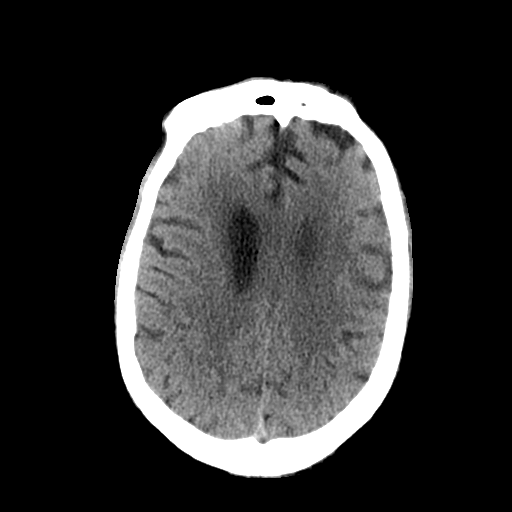
[im 25/36  bone]
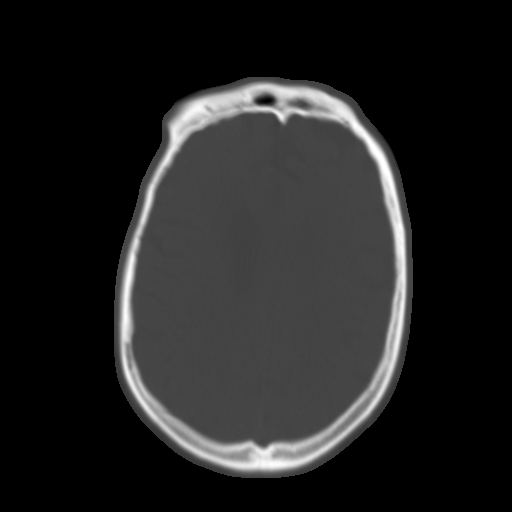
[im 28/36  brain]
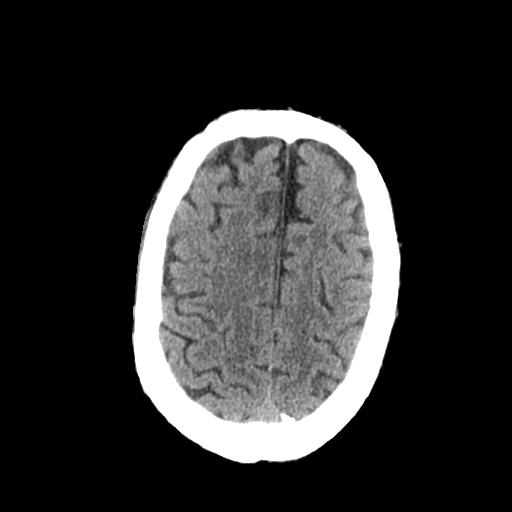
[im 31/36  brain]
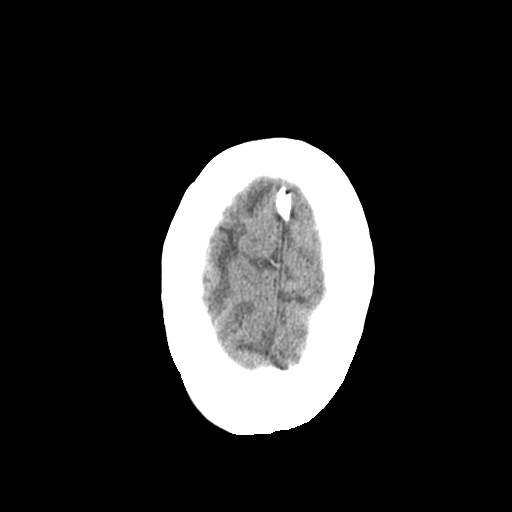
[im 33/36  brain]
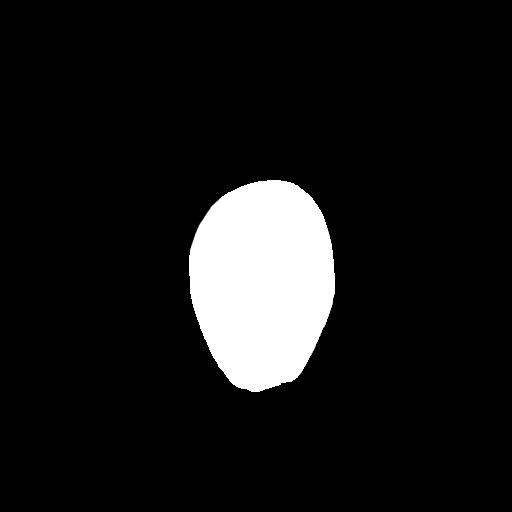

[Series 7: sagittal soft tissue · sagittal · 0.32mm/px · 3 of 59 slices shown]
[im 22/59  brain]
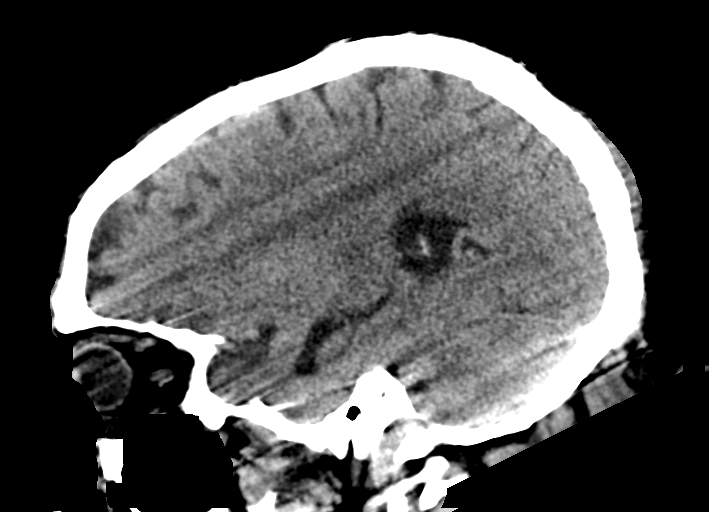
[im 30/59  brain]
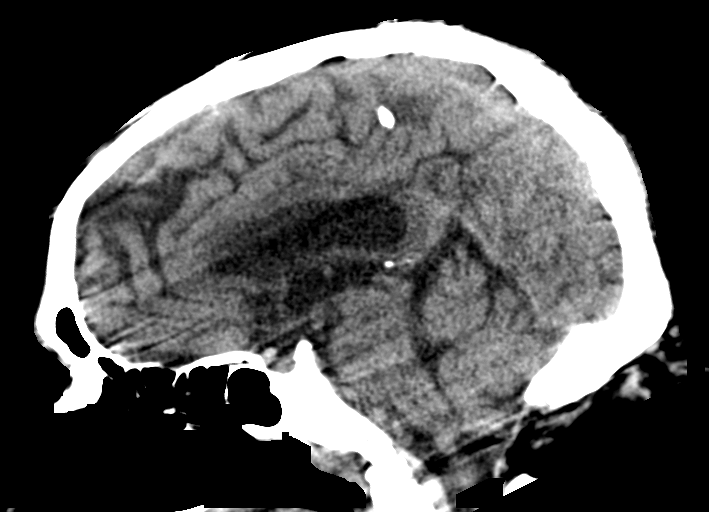
[im 37/59  brain]
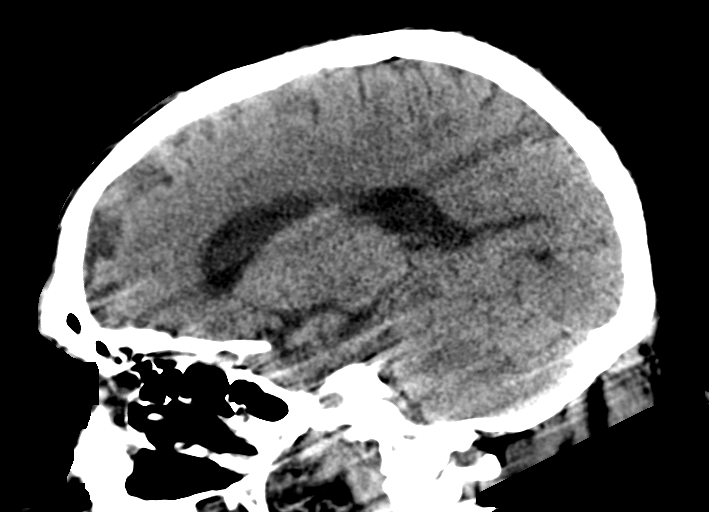

[15 of 37 positions shown; findings below may reference images not displayed]

FINDINGS: Brain: No evidence of acute infarction, hemorrhage, hydrocephalus,
extra-axial collection or mass lesion/mass effect. Cavum septum
pellucidum and cavum vergae incidentally noted. Mild low-density
changes within the periventricular and subcortical white matter
compatible with chronic microvascular ischemic change. Mild diffuse
cerebral volume loss.

Vascular: Atherosclerotic calcifications involving the large vessels
of the skull base. No unexpected hyperdense vessel.

Skull: Normal. Negative for fracture or focal lesion.

Sinuses/Orbits: Partial bilateral mastoid opacification. Paranasal
sinuses are clear.

Other: None.
IMPRESSION: 1. No acute intracranial findings.
2. Chronic microvascular ischemic change and cerebral volume loss.
3. Partial bilateral mastoid opacification.

## 2020-12-08 IMAGING — DX DG CHEST 1V PORT
1 series · 1 of 1 positions shown · non-contrast
Comparison: [DATE]

CLINICAL DATA: Possible sepsis

EXAM:
PORTABLE CHEST 1 VIEW

[chest ap]
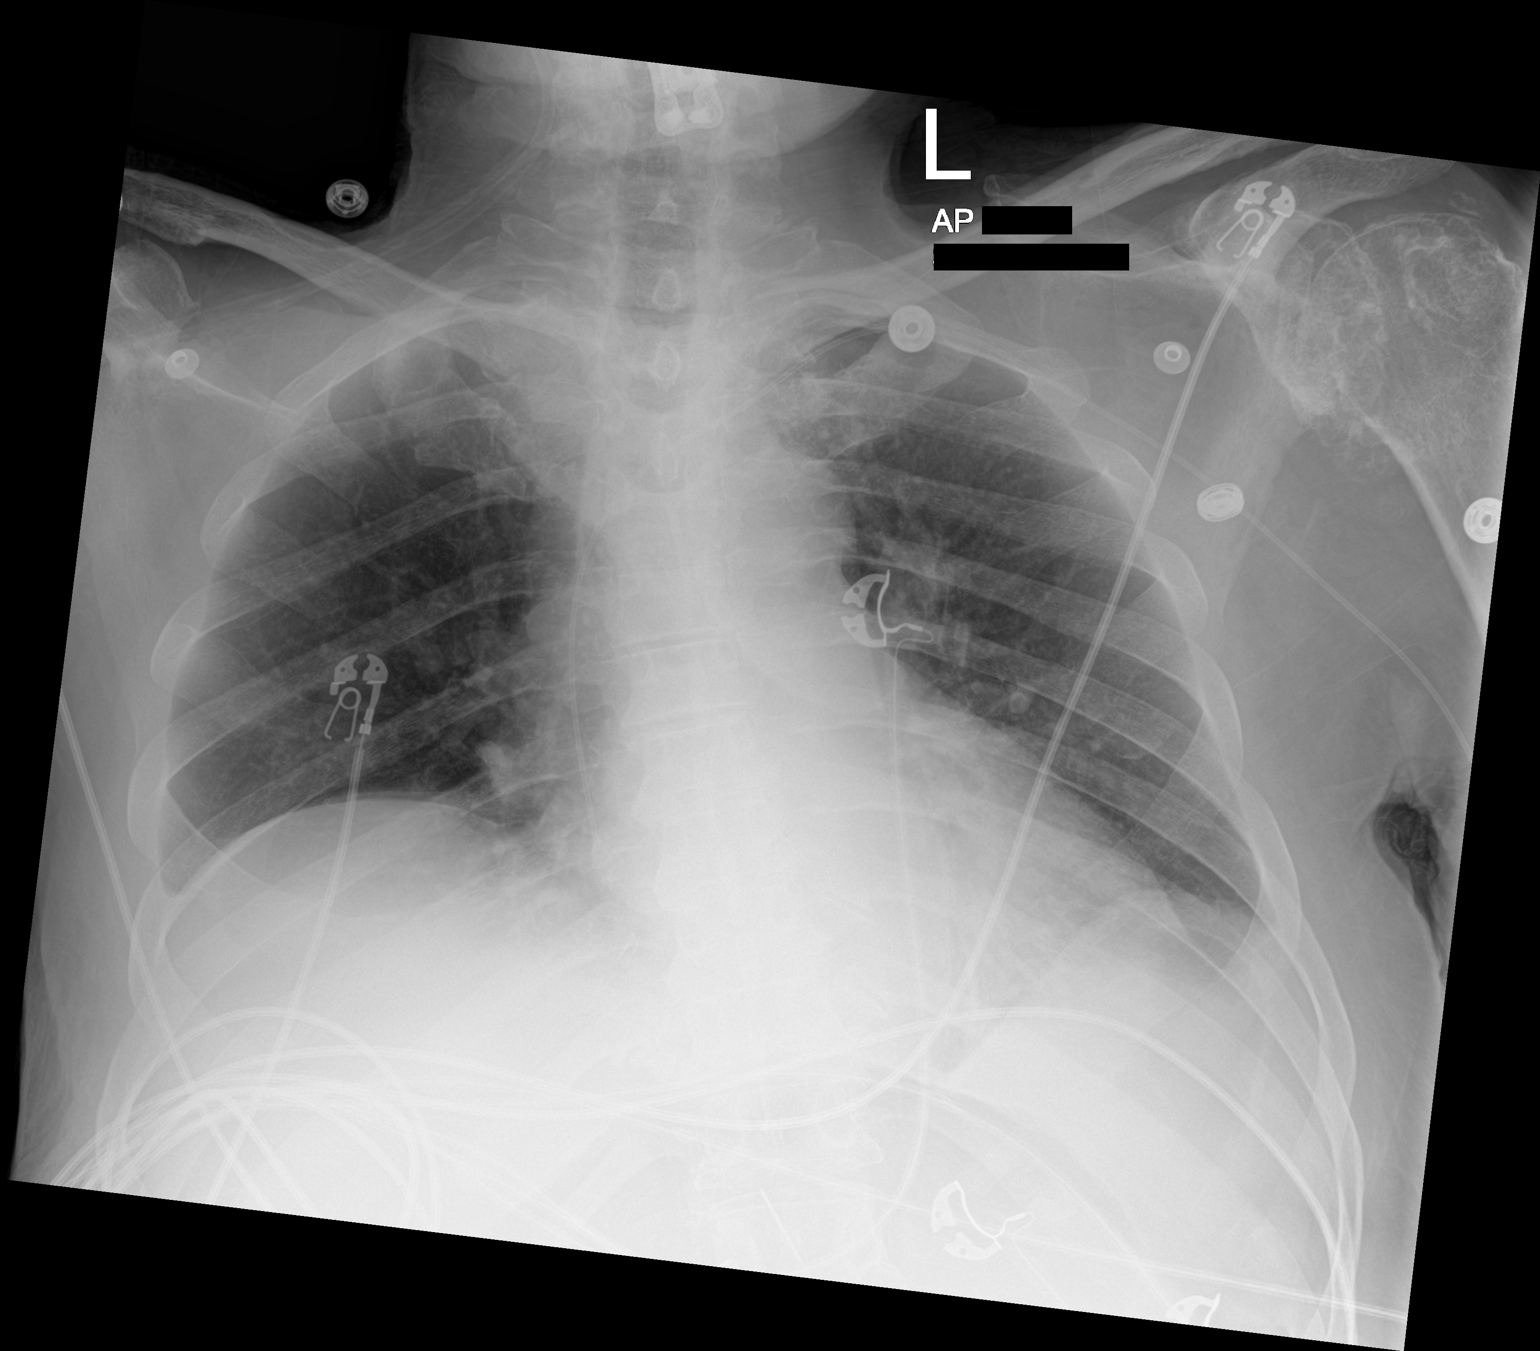

[1 of 1 positions shown; findings below may reference images not displayed]

FINDINGS: Cardiac shadow is mildly enlarged but stable. Left-sided PICC line
is noted at the cavoatrial junction. The overall inspiratory effort
is poor. Stable small pleural effusion on the right is noted. No
acute abnormality noted.
IMPRESSION: Chronic changes in the right lung base with small effusion. No focal
infiltrate is noted.

## 2020-12-08 IMAGING — CT CT ABD-PELV W/ CM
3 of 11 series · 11 of 46 positions shown, 17 images · IV contrast (omnipaque)
Comparison: CT stone study [DATE]

CLINICAL DATA: Abdominal pain.  Fever.

EXAM:
CT ANGIOGRAPHY CHEST
CT ABDOMEN AND PELVIS WITH CONTRAST
TECHNIQUE: Multidetector CT imaging of the chest was performed using the
standard protocol during bolus administration of intravenous
contrast. Multiplanar CT image reconstructions and MIPs were
obtained to evaluate the vascular anatomy. Multidetector CT imaging
of the abdomen and pelvis was performed using the standard protocol
during bolus administration of intravenous contrast.
CONTRAST:  80mL OMNIPAQUE IOHEXOL 350 MG/ML SOLN

[Series 6: axial st · axial · 0.79mm/px · z∈[-940,-610]mm · 4 of 111 slices shown, 9 images]
[im 23/111  soft-tissue]
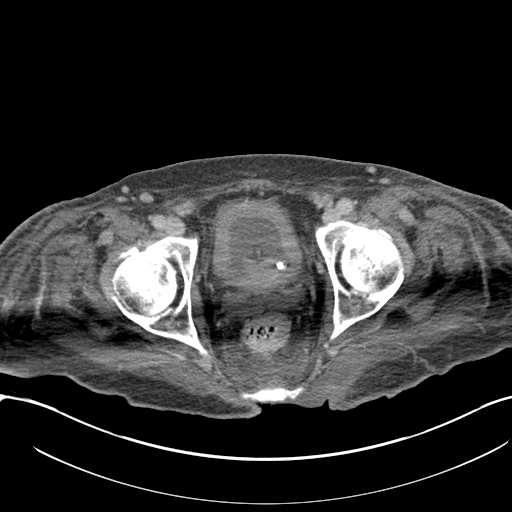
[im 23/111  lung]
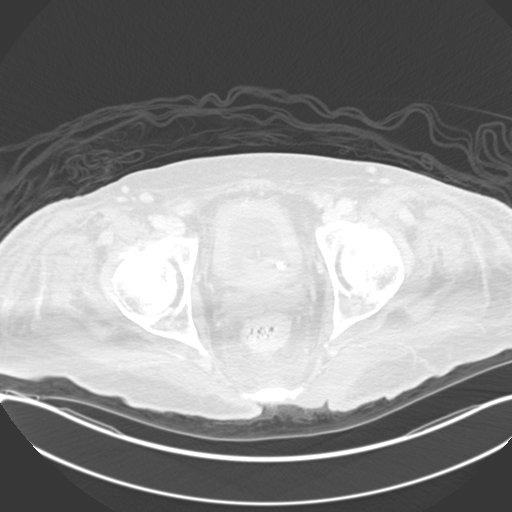
[im 23/111  bone]
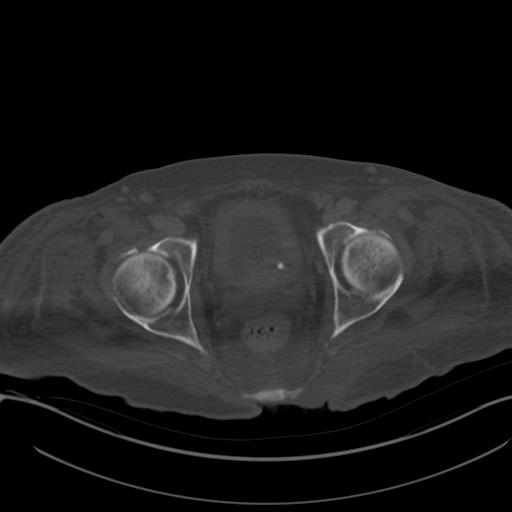
[im 45/111  soft-tissue]
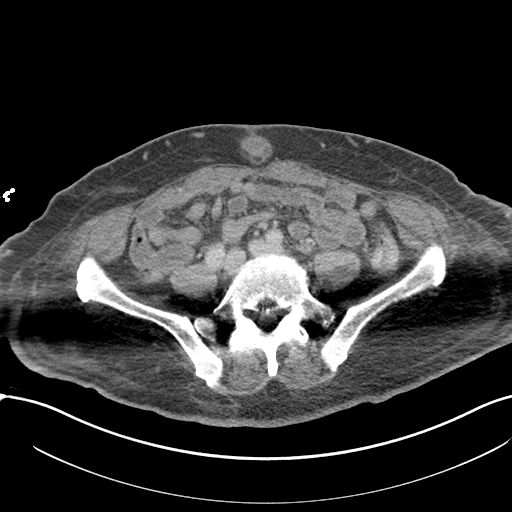
[im 45/111  lung]
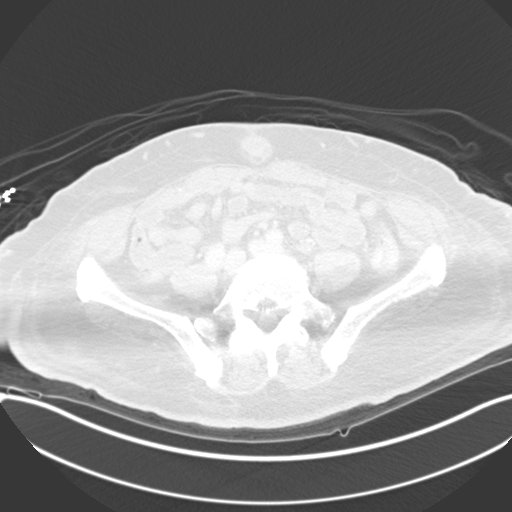
[im 67/111  soft-tissue]
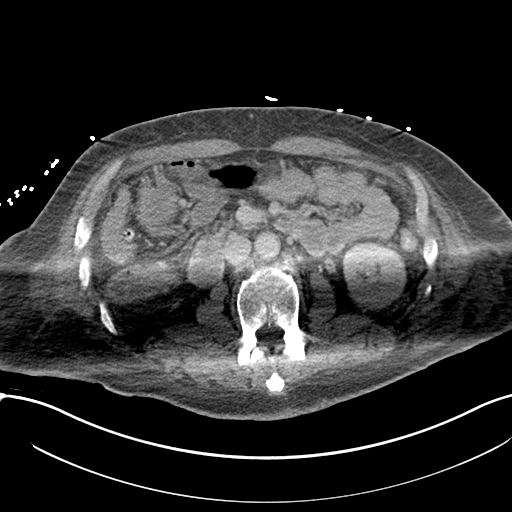
[im 67/111  lung]
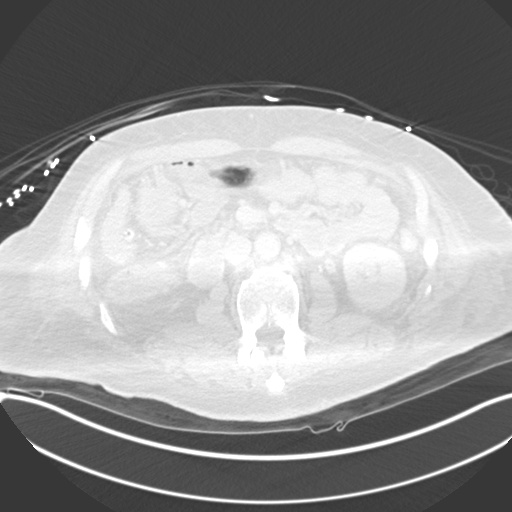
[im 89/111  soft-tissue]
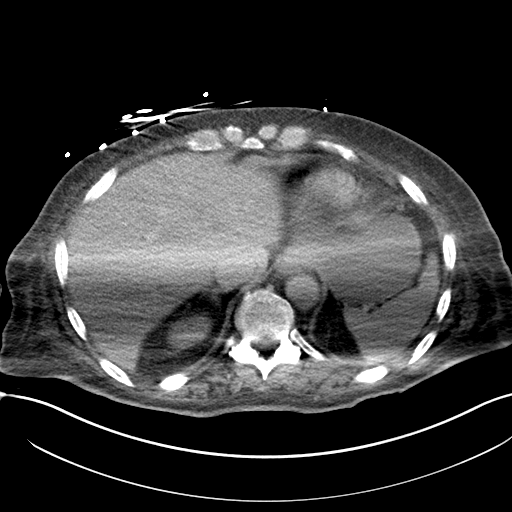
[im 89/111  lung]
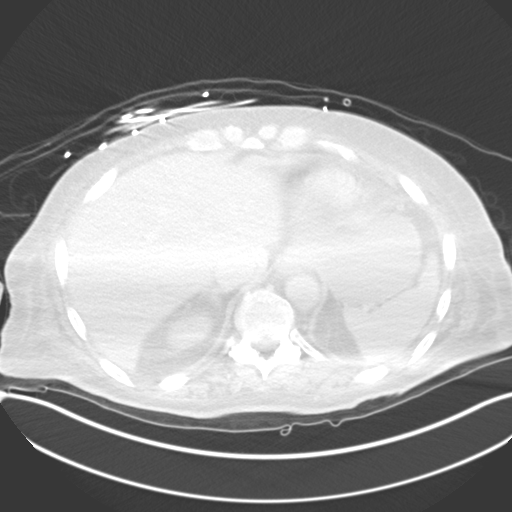

[Series 10: thins · axial · 0.77mm/px · z∈[-642,-509]mm · 5 of 287 slices shown]
[im 20/287  soft-tissue]
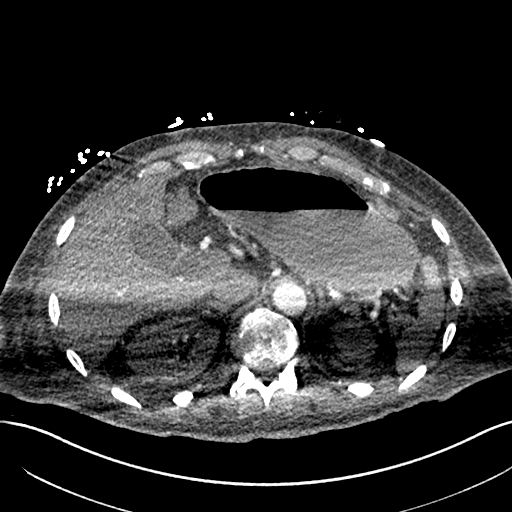
[im 58/287  soft-tissue]
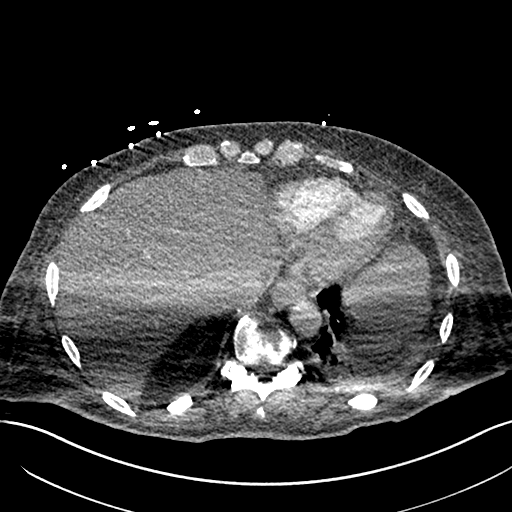
[im 96/287  soft-tissue]
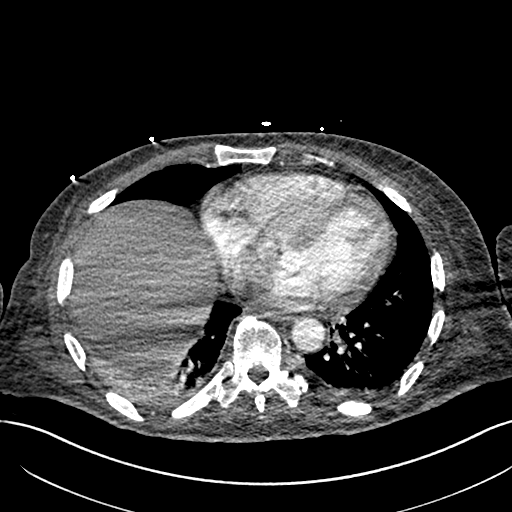
[im 134/287  soft-tissue]
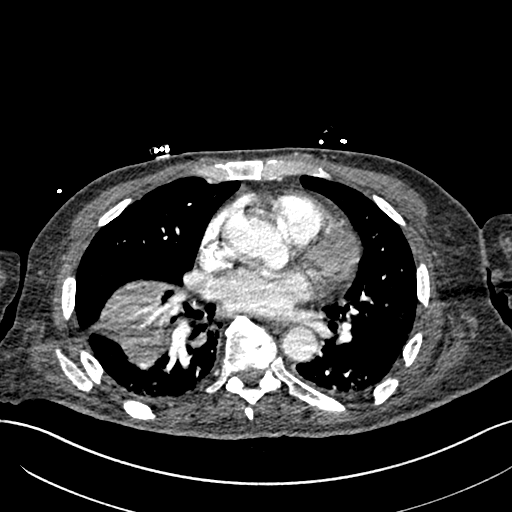
[im 153/287  soft-tissue]
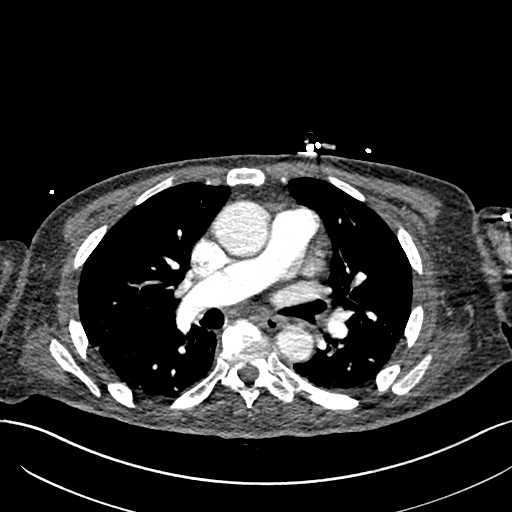

[Series 15: coronal mpr · coronal · 0.62mm/px · 2 of 129 slices shown, 3 images]
[im 43/129  soft-tissue]
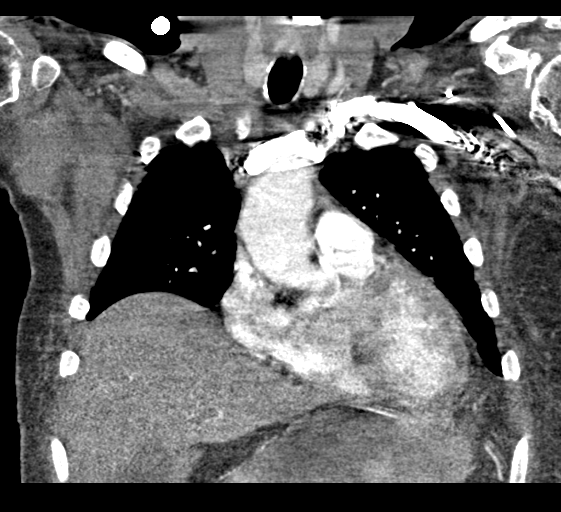
[im 43/129  bone]
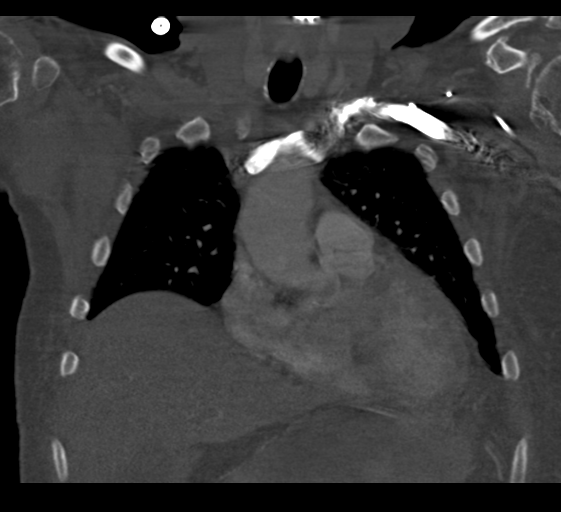
[im 86/129  soft-tissue]
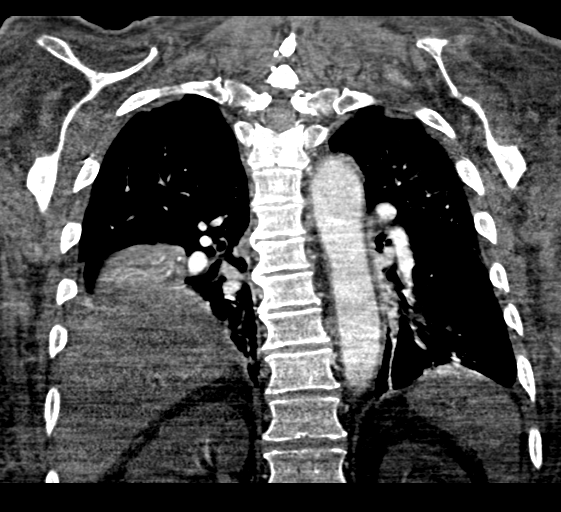

[11 of 46 positions shown; findings below may reference images not displayed]

FINDINGS: CTA CHEST FINDINGS

Cardiovascular: Heart size is normal. Aorta is within normal limits.
Pulmonary artery opacification is excellent. No focal filling
defects are present to suggest pulmonary emboli.

Mediastinum/Nodes: No significant mediastinal, hilar, or axillary
adenopathy is present. Esophagus is within normal limits. Thoracic
inlet is unremarkable.

Lungs/Pleura: Asymmetric right lower lobe airspace disease is
present. Small effusions are present bilaterally. There is some
dependent atelectasis on the left. Mild dependent atelectasis is
present in the upper lobes bilaterally. No pneumothorax is present.

Musculoskeletal: Vertebral body heights and alignment are
maintained. Thoracic kyphosis normal.

Review of the MIP images confirms the above findings.

CT ABDOMEN and PELVIS FINDINGS

Hepatobiliary: Gallstone again noted. No inflammatory changes
associated. Liver is unremarkable.

Pancreas: Unremarkable. No pancreatic ductal dilatation or
surrounding inflammatory changes.

Spleen: Normal in size without focal abnormality.

Adrenals/Urinary Tract: Adrenal glands are normal bilaterally.
Bilateral nonobstructing nephrolithiasis is stable. No mass lesion
present. The left ureter remains dilated with some inflammation.
Stone is present at the UVJ. Foley catheter is present in the
urinary bladder.

Stomach/Bowel: Peg tube place. Moderate fluid is present within the
stomach. Inflammatory change present. Small bowel unremarkable.
Terminal ileum within normal limits. Mobile cecum noted. Ascending
and transverse colon are within normal limits. Descending colon
unremarkable. Diverticula are present in the descending and sigmoid
colon without inflammation.

Vascular/Lymphatic: Atherosclerotic calcifications are present in
the aorta and branch vessels without aneurysm. No significant
adenopathy is present.

Reproductive: Prostate is unremarkable.

Other: There is some stranding in the subcutaneous fat. No free
fluid or free air is present. No significant ventral hernias are
present.

Musculoskeletal: Endplate changes of the lumbar spine are most
pronounced at L1-2 anteriorly. Disc disease at L4-5 contributes to
bilateral foraminal narrowing. Moderate degenerative changes are
noted in the hips bilaterally. No focal lytic or blastic lesions are
present.

Review of the MIP images confirms the above findings.
IMPRESSION: 1. No pulmonary embolus.
2. Right lower lobe airspace disease, concerning for pneumonia.
3. Small bilateral pleural effusions and associated atelectasis.
4. Stable bilateral nonobstructing nephrolithiasis.
5. Cholelithiasis without evidence for cholecystitis.
6. Descending and sigmoid diverticulosis without diverticulitis.
7. Degenerative changes of the lumbar spine are most pronounced at
L1-2 and L4-5.
8. Aortic Atherosclerosis ([CK]-[CK]).

## 2020-12-08 MED ORDER — LACTATED RINGERS IV SOLN: INTRAVENOUS | Status: DC

## 2020-12-08 MED ORDER — HEPARIN SODIUM (PORCINE) 5000 UNIT/ML IJ SOLN
5000.0000 [IU] | Freq: Three times a day (TID) | INTRAMUSCULAR | Status: DC
  Administered 2020-12-08 – 2020-12-10 (×6): 5000 [IU] via SUBCUTANEOUS
  Filled 2020-12-08 (×6): qty 1

## 2020-12-08 MED ORDER — LACTATED RINGERS IV BOLUS (SEPSIS)
1000.0000 mL | Freq: Once | INTRAVENOUS | Status: AC
  Administered 2020-12-08: 1000 mL via INTRAVENOUS

## 2020-12-08 MED ORDER — MIDODRINE HCL 5 MG PO TABS
10.0000 mg | ORAL_TABLET | Freq: Three times a day (TID) | ORAL | Status: DC
  Administered 2020-12-08 – 2020-12-21 (×38): 10 mg
  Filled 2020-12-08 (×35): qty 2

## 2020-12-08 MED ORDER — VANCOMYCIN HCL IN DEXTROSE 1-5 GM/200ML-% IV SOLN
1000.0000 mg | Freq: Once | INTRAVENOUS | Status: AC
  Administered 2020-12-08: 1000 mg via INTRAVENOUS
  Filled 2020-12-08: qty 200

## 2020-12-08 MED ORDER — ACETAMINOPHEN 650 MG RE SUPP
650.0000 mg | Freq: Once | RECTAL | Status: AC
  Administered 2020-12-08: 650 mg via RECTAL
  Filled 2020-12-08: qty 1

## 2020-12-08 MED ORDER — IOHEXOL 350 MG/ML SOLN
80.0000 mL | Freq: Once | INTRAVENOUS | Status: AC | PRN
  Administered 2020-12-08: 80 mL via INTRAVENOUS

## 2020-12-08 MED ORDER — POLYETHYLENE GLYCOL 3350 17 G PO PACK: 17.0000 g | PACK | Freq: Every day | ORAL | Status: DC | PRN

## 2020-12-08 MED ORDER — SODIUM CHLORIDE 0.9 % IV SOLN
250.0000 mL | INTRAVENOUS | Status: DC
  Administered 2020-12-13: 250 mL via INTRAVENOUS

## 2020-12-08 MED ORDER — DOCUSATE SODIUM 100 MG PO CAPS: 100.0000 mg | ORAL_CAPSULE | Freq: Two times a day (BID) | ORAL | Status: DC | PRN

## 2020-12-08 MED ORDER — SODIUM CHLORIDE 0.9 % IV SOLN
2.0000 g | Freq: Two times a day (BID) | INTRAVENOUS | Status: DC
  Administered 2020-12-08: 2 g via INTRAVENOUS
  Filled 2020-12-08: qty 2

## 2020-12-08 MED ORDER — VANCOMYCIN HCL 750 MG/150ML IV SOLN
750.0000 mg | INTRAVENOUS | Status: DC
  Administered 2020-12-09: 750 mg via INTRAVENOUS
  Filled 2020-12-08: qty 150

## 2020-12-08 MED ORDER — PANTOPRAZOLE SODIUM 40 MG PO PACK
40.0000 mg | PACK | Freq: Every day | ORAL | Status: DC
  Administered 2020-12-09 – 2020-12-21 (×13): 40 mg
  Filled 2020-12-08 (×14): qty 20

## 2020-12-08 MED ORDER — EPINEPHRINE HCL 5 MG/250ML IV SOLN IN NS
0.5000 ug/min | INTRAVENOUS | Status: DC
  Filled 2020-12-08: qty 250

## 2020-12-08 MED ORDER — NOREPINEPHRINE 4 MG/250ML-% IV SOLN
0.0000 ug/min | INTRAVENOUS | Status: DC
  Administered 2020-12-08 (×2): 40 ug/min via INTRAVENOUS
  Administered 2020-12-08: 20 ug/min via INTRAVENOUS
  Administered 2020-12-08: 40 ug/min via INTRAVENOUS
  Administered 2020-12-08 – 2020-12-09 (×2): 26 ug/min via INTRAVENOUS
  Administered 2020-12-09: 34 ug/min via INTRAVENOUS
  Administered 2020-12-09: 35 ug/min via INTRAVENOUS
  Administered 2020-12-09: 36 ug/min via INTRAVENOUS
  Administered 2020-12-09: 26 ug/min via INTRAVENOUS
  Administered 2020-12-09: 36 ug/min via INTRAVENOUS
  Administered 2020-12-10: 28 ug/min via INTRAVENOUS
  Administered 2020-12-10: 23 ug/min via INTRAVENOUS
  Administered 2020-12-10: 24 ug/min via INTRAVENOUS
  Administered 2020-12-10: 21 ug/min via INTRAVENOUS
  Administered 2020-12-10: 25 ug/min via INTRAVENOUS
  Administered 2020-12-10: 29 ug/min via INTRAVENOUS
  Administered 2020-12-11 (×3): 13 ug/min via INTRAVENOUS
  Administered 2020-12-11: 17 ug/min via INTRAVENOUS
  Administered 2020-12-11: 13 ug/min via INTRAVENOUS
  Administered 2020-12-12: 8 ug/min via INTRAVENOUS
  Administered 2020-12-12: 11 ug/min via INTRAVENOUS
  Administered 2020-12-12: 13 ug/min via INTRAVENOUS
  Administered 2020-12-13: 5 ug/min via INTRAVENOUS
  Administered 2020-12-13 – 2020-12-14 (×2): 8 ug/min via INTRAVENOUS
  Filled 2020-12-08 (×31): qty 250
  Filled 2020-12-08: qty 500
  Filled 2020-12-08 (×2): qty 250

## 2020-12-08 MED ORDER — SODIUM CHLORIDE 0.9 % IV SOLN: 2.0000 g | Freq: Once | INTRAVENOUS | Status: DC

## 2020-12-08 MED ORDER — DEXTROSE IN LACTATED RINGERS 5 % IV SOLN
INTRAVENOUS | Status: DC
  Administered 2020-12-08: 75 mL/h via INTRAVENOUS
  Filled 2020-12-08: qty 1000

## 2020-12-08 MED ORDER — LACTATED RINGERS IV BOLUS
2000.0000 mL | Freq: Once | INTRAVENOUS | Status: AC
  Administered 2020-12-08: 2000 mL via INTRAVENOUS

## 2020-12-08 MED ORDER — SODIUM CHLORIDE 0.9 % IV SOLN
2.0000 g | Freq: Once | INTRAVENOUS | Status: AC
  Administered 2020-12-08: 2 g via INTRAVENOUS
  Filled 2020-12-08: qty 2

## 2020-12-08 NOTE — ED Triage Notes (Signed)
Per facility pt is normally able to talk. Pt became unresponsive with a GCS of 3 today.   Pt initially had no radial pulses and first BP was 60/30. CBG 97, HR 120, O2 95 on 3L (O2 was baseline), CO2 32.  EMS gave pt with no improvement. EMS started pt on Epi gtt at a minute.   Dr. Delford Field made aware and at beside @1340 .  Pt responsive to pain with EMS after Epi gtt and NS bolus given.  Pt with hx of sepsis on Vanco and PICC line to left arm.

## 2020-12-08 NOTE — Progress Notes (Signed)
Pharmacy Antibiotic Note  Shannon [name] is a 75 y.o. male admitted on 12/08/2020 with septic shock.  Pharmacy has been consulted for cefepime and vancomycin dosing for pneumonia.  IV antibiotics administered in ED: Cefepime 2 g Vancomycin 1 g  Plan: Continue cefepime 2 g IV every 12 hours Continue Vancomycin at 750 mg IV Q 24 hrs (Goal AUC 400-550, Expected AUC: 470.8, SCr used: 1.61) Monitor clinical picture, renal function, vancomycin levels if indicated F/U C&S, abx deescalation / LOT       Temp (24hrs), Avg:100.7 F (38.2 C), Min:100.7 F (38.2 C), Max:100.7 F (38.2 C)  Recent Labs  Lab 12/08/20 1351 12/08/20 1715  WBC 26.2*  --   CREATININE 1.61*  --   LATICACIDVEN 8.1* 7.3*    CrCl cannot be calculated (Unknown ideal weight.).   Estimated creatinine clearance 41 ml/min  Allergies  Allergen Reactions   Iodine Swelling    NOT CT CONTRAST   Penicillins Swelling    ** tolerates cephalosporins Facial swelling, itchy throat    Antimicrobials this admission: 6/17cefepime >> 6/17 vancomycin >>   Dose adjustments this admission:   Microbiology results: 6/17 BCx: sent 6/17 UCx: sent  11/13/20 MRSA PCR: positive  Thank you for allowing pharmacy to be a part of this patient's care.  Royce Macadamia, PharmD, BCPS 12/08/2020 7:00 PM

## 2020-12-08 NOTE — Progress Notes (Signed)
A consult was received from an ED physician for vancomycin and aztreonam per pharmacy dosing.  The patient's profile has been reviewed for ht/wt/allergies/indication/available labs.    Pt has PCN allergy documented, but has tolerated cephalosporins numerous times in the past (cephalexin, ceftriaxone, and cefepime) per Epic records. Per consult guidance, will change aztreonam to cefepime.  STAT ht/wt order entered for medication dosing. No weight in chart.   A one time order has been placed for vancomycin 1000 mg IV once +  cefepime 2 g IV once. Further antibiotics/pharmacy consults should be ordered by admitting physician if indicated.                       Thank you, Cindi Carbon, PharmD 12/08/2020  2:02 PM

## 2020-12-08 NOTE — ED Provider Notes (Signed)
**Shannon [name] De-Identified via Obfuscation** Shannon Shannon [name]   CSN: [id number] Arrival date & time: 12/08/20  1327     History Chief Complaint  Patient presents with   Altered Mental Status   Hypotension    Shannon Shannon [name] is a 75 y.o. male.  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  He was found to be unresponsive today, and EMS was called.  When they arrived at his facility, his blood pressure was 60/30, and his heart rate was 120.  He was given a small dose of IV fluid and started on an epinephrine drip.  Per his daughter, he was able to eat and interact yesterday, and this is a new change for him.  He was recently admitted for hemorrhagic shock secondary to hematuria.  He also recently completed vancomycin and ertapenem for a large sacral ulcer.  He is a patient of hospice, but he is a full code.  I clarified with his daughter that all measures are to be taken in the event of a cardiac arrest.  The history is provided by the EMS personnel and a relative (daughter). The history is limited by the condition of the patient.  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 Newell Coral, 1992-->surg at [facility].  Summer 2021->Cervicalgia,arm pain, hand atrophy RUE wkness, hyperreflex (Dr. 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   Hematuria 06/19/2020   Sacral decubitus ulcer 06/19/2020   COVID-19 virus infection 06/06/2020 06/19/2020   Palliative care by specialist    Goals of care, counseling/discussion    Protein-calorie malnutrition, severe 05/05/2020   Chronic indwelling Foley catheter 05/03/2020   Quadriplegia and quadriparesis (HCC)    Acute blood loss anemia    Slow transit constipation    Neuropathic pain    Asthma    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    Past Surgical History:  Procedure Laterality Date   ANTERIOR CERVICAL DECOMP/DISCECTOMY FUSION N/A 04/05/2020   Procedure: ANTERIOR CERVICAL DECOMPRESSION/DISCECTOMY FUSION, INTERBODY PROSTHESIS, PLATE/SCREWS CERVICAL THREE-CERVICAL FOUR, CERVICAL FOUR- CERVICAL FIVE;  Surgeon: Tressie Stalker, MD;  Location: [facility] OR;  Service: Neurosurgery;  Laterality: N/A;   ANTERIOR CERVICAL DECOMP/DISCECTOMY FUSION N/A 04/22/2020   Procedure: Reexploration of anterior cervical wound for epidural hematoma;  Surgeon: Donalee Citrin, MD;  Location: [facility] OR;  Service: Neurosurgery;  Laterality: N/A;   BACK SURGERY     CYSTOSCOPY N/A 11/13/2020   Procedure: CYSTOSCOPY, CLOT EVACUATION, FULGURATION;  Surgeon: Noel Christmas, MD;  Location: [facility] ORS;  Service: Urology;  Laterality: N/A;   INCISION AND DRAINAGE Left 2012   L hand infection   IR  GASTROSTOMY TUBE MOD SED  07/05/2020   ROTATOR CUFF REPAIR Right    x 3   SPINE SURGERY         Family History  Problem Relation Age of Onset   Heart attack Mother    Heart disease Mother    High blood pressure Mother    Alcohol abuse Father    Cancer Father    Diabetes Father    High blood pressure Father     Social History   Tobacco Use   Smoking status: Former    Pack years: 0.00   Smokeless tobacco: Never   Vaping Use   Vaping Use: Never used  Substance Use Topics   Alcohol use: Not Currently   Drug use: Never    Home Medications Prior to Admission medications   Medication Sig Start Date End Date Taking? Authorizing Provider  acetaminophen (TYLENOL) 500 MG tablet Take 1,000 mg by mouth in the morning and at bedtime.    [provider]  albuterol (VENTOLIN HFA) 108 (90 Base) MCG/ACT inhaler Inhale 2 puffs into the lungs daily.    [provider]  AMINO ACIDS-PROTEIN HYDROLYS PO Take by mouth 2 (two) times daily between meals. 17-100 gram/ Kcal/30 mL   (Also known as Pro -Stat)    [provider]  ascorbic acid (VITAMIN C) 500 MG tablet Take 1,000 mg by mouth daily.    [provider]  aspirin 81 MG chewable tablet Chew 1 tablet (81 mg total) by mouth every morning. 12/02/20   Rodolph Bong, MD  Cranberry 400 MG CAPS Take 1 capsule by mouth daily at 6 (six) AM.    [provider]  docusate (COLACE) 50 MG/5ML liquid Place 100 mg into feeding tube 2 (two) times daily.    [provider]  DULoxetine (CYMBALTA) 30 MG capsule Take 30 mg by mouth daily.    [provider]  Ferrous Sulfate 220 (44 Fe) MG/5ML LIQD Give 5 mLs by tube in the morning, at noon, and at bedtime. Via G tube    [provider]  Lactobacillus (PROBIOTIC ACIDOPHILUS) CAPS Place 1 capsule into feeding tube daily.    [provider]  midodrine (PROAMATINE) 10 MG tablet Place 1 tablet (10 mg total) into feeding tube 3 (three) times daily. 11/18/20   Rodolph Bong, MD  Multiple Vitamins-Minerals (ZINC PO) Place 220 mg into feeding tube daily.    [provider]  nutrition supplement, JUVEN, (JUVEN) PACK Place 1 packet into feeding tube 2 (two) times daily between meals. 11/18/20   Rodolph Bong, MD  Nutritional Supplements (FEEDING SUPPLEMENT, OSMOLITE 1.5 CAL,) LIQD Place 1,000 mLs into feeding tube continuous. 11/18/20   Rodolph Bong, MD  Nutritional Supplements (FEEDING SUPPLEMENT, PROSOURCE TF,) liquid Place 45 mLs into feeding tube 2 (two) times daily. 11/18/20   Rodolph Bong, MD  omeprazole (PRILOSEC) 20 MG capsule Take 20 mg by mouth every other day. Per Tube    [provider]  oxyCODONE (OXY IR/ROXICODONE) 5 MG immediate release tablet Take 0.5 tablets (2.5 mg total) by mouth 2 (two) times daily as needed for moderate pain or severe pain. 11/18/20   Rodolph Bong, MD  oxyCODONE-acetaminophen (PERCOCET/ROXICET) 5-325 MG tablet Place 1 tablet into feeding tube 2 (two) times daily. 11/18/20   Rodolph Bong, MD  pantoprazole sodium (PROTONIX) 40 mg/20 mL PACK Place 20 mLs (40 mg total) into feeding tube daily. 11/19/20   Rodolph Bong,  MD  polyethylene glycol (MIRALAX / GLYCOLAX) 17 g packet Place 17 g into feeding tube daily.    [provider]  pregabalin (LYRICA) 100 MG capsule Take 100 mg by mouth 2 (two) times daily.    [provider]  scopolamine (TRANSDERM-SCOP) 1 MG/3DAYS Place 1 patch onto the skin every 3 (three) days.    [provider]  tamsulosin (FLOMAX) 0.4 MG CAPS capsule Take 1 capsule (0.4 mg total) by mouth daily. 04/28/20   Tressie Stalker, MD    Allergies    Iodine and Penicillins  Review of Systems   Review of Systems  Unable to perform ROS: Unstable vital signs  Constitutional:  Positive for fever.   Physical Exam Updated Vital Signs BP 97/79   Pulse (!) 115   Temp (!) 100.7 F (38.2 C) (Rectal)   Resp (!) 23   SpO2 100%   Physical Exam Constitutional:      Appearance: He is ill-appearing.  HENT:     Head: Normocephalic and atraumatic.     Nose: Nose normal.     Mouth/Throat:     Mouth: Mucous membranes are dry.  Eyes:     Conjunctiva/sclera: Conjunctivae normal.  Cardiovascular:     Rate and Rhythm: Regular rhythm. Tachycardia present.  Pulmonary:     Breath sounds: Rhonchi present.     Comments:  tachypnea Abdominal:     General: There is no distension.     Tenderness: There is no abdominal tenderness. There is no guarding.  Genitourinary:    Comments: Hematuria in Foley bag Musculoskeletal:        General: No signs of injury.     Comments: Mild edema of all extremities.  Posturing in his lower extremities which is chronic.  Scattered wounds over his lower legs which were all dressed and covered.  PICC to left upper arm  Skin:    Comments: There is a large, deep ulcer encompassing the entirety of his sacrum.  There is exposed muscle and a foul smell to the area.  Neurological:     Mental Status: He is lethargic.     GCS: GCS eye subscore is 1. GCS verbal subscore is 4. GCS motor subscore is 5.     Comments: Quadriparetic    ED Results / Procedures / Treatments   Labs (all labs ordered are listed, but only abnormal results are displayed) Labs Reviewed  URINE CULTURE - Abnormal; Notable for the following components:      Result Value   Culture MULTIPLE SPECIES PRESENT, SUGGEST RECOLLECTION (*)    All other components within normal limits  LACTIC ACID, PLASMA - Abnormal; Notable for the following components:   Lactic Acid, Venous 8.1 (*)    All other components within normal limits  LACTIC ACID, PLASMA - Abnormal; Notable for the following components:   Lactic Acid, Venous 7.3 (*)    All other components within normal limits  COMPREHENSIVE METABOLIC PANEL - Abnormal; Notable for the following components:   BUN 37 (*)    Creatinine, Ser 1.61 (*)    Calcium 8.8 (*)    Albumin 2.2 (*)    AST 229 (*)    ALT 99 (*)    Alkaline Phosphatase 226 (*)    Total Bilirubin 1.5 (*)    GFR, Estimated 45 (*)    All other components within normal limits  CBC WITH DIFFERENTIAL/PLATELET - Abnormal; Notable for the following components:   WBC 26.2 (*)    RBC 3.67 (*)  Hemoglobin 9.6 (*)    HCT 32.8 (*)    MCHC 29.3 (*)    RDW 16.2 (*)    Platelets 148 (*)    Neutro Abs 25.1 (*)     Lymphs Abs 0.4 (*)    Abs Immature Granulocytes 0.35 (*)    All other components within normal limits  PROTIME-INR - Abnormal; Notable for the following components:   Prothrombin Time 18.4 (*)    INR 1.5 (*)    All other components within normal limits  URINALYSIS, ROUTINE W REFLEX MICROSCOPIC - Abnormal; Notable for the following components:   Color, Urine RED (*)    APPearance CLOUDY (*)    Glucose, UA 50 (*)    Hgb urine dipstick MODERATE (*)    Protein, ur 100 (*)    Leukocytes,Ua MODERATE (*)    RBC / HPF >50 (*)    Bacteria, UA FEW (*)    All other components within normal limits  D-DIMER, QUANTITATIVE - Abnormal; Notable for the following components:   D-Dimer, Quant >20.00 (*)    All other components within normal limits  LACTIC ACID, PLASMA - Abnormal; Notable for the following components:   Lactic Acid, Venous 9.7 (*)    All other components within normal limits  LACTIC ACID, PLASMA - Abnormal; Notable for the following components:   Lactic Acid, Venous 7.0 (*)    All other components within normal limits  PROTIME-INR - Abnormal; Notable for the following components:   Prothrombin Time 19.4 (*)    INR 1.6 (*)    All other components within normal limits  COMPREHENSIVE METABOLIC PANEL - Abnormal; Notable for the following components:   Sodium 134 (*)    BUN 35 (*)    Calcium 8.6 (*)    Albumin 2.2 (*)    AST 195 (*)    ALT 91 (*)    Alkaline Phosphatase 211 (*)    All other components within normal limits  CBC - Abnormal; Notable for the following components:   WBC 49.4 (*)    RBC 3.96 (*)    Hemoglobin 10.3 (*)    HCT 35.1 (*)    MCHC 29.3 (*)    RDW 16.5 (*)    All other components within normal limits  BLOOD GAS, ARTERIAL - Abnormal; Notable for the following components:   pO2, Arterial 191 (*)    Acid-Base Excess 2.5 (*)    All other components within normal limits  BLOOD GAS, ARTERIAL - Abnormal; Notable for the following components:   pO2,  Arterial 56.8 (*)    Acid-Base Excess 2.2 (*)    All other components within normal limits  LACTIC ACID, PLASMA - Abnormal; Notable for the following components:   Lactic Acid, Venous 2.8 (*)    All other components within normal limits  COMPREHENSIVE METABOLIC PANEL - Abnormal; Notable for the following components:   Potassium 2.9 (*)    Glucose, Bld 166 (*)    BUN 26 (*)    Calcium 8.1 (*)    Total Protein 6.4 (*)    Albumin 1.7 (*)    AST 100 (*)    ALT 56 (*)    Alkaline Phosphatase 159 (*)    All other components within normal limits  CBC - Abnormal; Notable for the following components:   WBC 47.2 (*)    RBC 3.35 (*)    Hemoglobin 9.0 (*)    HCT 28.5 (*)    RDW 16.3 (*)  Platelets 109 (*)    All other components within normal limits  MAGNESIUM - Abnormal; Notable for the following components:   Magnesium 1.4 (*)    All other components within normal limits  PHOSPHORUS - Abnormal; Notable for the following components:   Phosphorus 2.1 (*)    All other components within normal limits  LACTIC ACID, PLASMA - Abnormal; Notable for the following components:   Lactic Acid, Venous 2.1 (*)    All other components within normal limits  PROTIME-INR - Abnormal; Notable for the following components:   Prothrombin Time 17.9 (*)    INR 1.5 (*)    All other components within normal limits  TROPONIN I (HIGH SENSITIVITY) - Abnormal; Notable for the following components:   Troponin I (High Sensitivity) 3,292 (*)    All other components within normal limits  TROPONIN I (HIGH SENSITIVITY) - Abnormal; Notable for the following components:   Troponin I (High Sensitivity) 2,965 (*)    All other components within normal limits  RESP PANEL BY RT-PCR (FLU A&B, COVID) [id number]  CULTURE, BLOOD (ROUTINE X 2)  CULTURE, BLOOD (ROUTINE X 2)  APTT  PROCALCITONIN  MAGNESIUM  PHOSPHORUS  DIFFERENTIAL  DIC (DISSEMINATED INTRAVASCULAR COAGULATION)PANEL  HEPARIN INDUCED PLATELET AB (HIT ANTIBODY)   CBG MONITORING, ED  TROPONIN I (HIGH SENSITIVITY)    EKG EKG Interpretation  Date/Time:  Friday December 08 2020 13:38:55 EDT Ventricular Rate:  121 PR Interval:  178 QRS Duration: 89 QT Interval:  313 QTC Calculation: 444 R Axis:   -33 Text Interpretation: Sinus tachycardia Left axis deviation Borderline low voltage, extremity leads No acute ischemia Confirmed by Pieter Partridge ([id number]) on 12/08/2020 2:41:08 PM  Radiology DG Chest Port 1 View  Result Date: 12/08/2020 CLINICAL DATA:  Possible sepsis EXAM: PORTABLE CHEST 1 VIEW COMPARISON:  07/04/2020 FINDINGS: Cardiac shadow is mildly enlarged but stable. Left-sided PICC line is noted at the cavoatrial junction. The overall inspiratory effort is poor. Stable small pleural effusion on the right is noted. No acute abnormality noted. IMPRESSION: Chronic changes in the right lung base with small effusion. No focal infiltrate is noted. Electronically Signed   By: Alcide Clever M.D.   On: 12/08/2020 15:09    Procedures .Critical Care  Date/Time: 12/08/2020 4:32 PM Performed by: Koleen Distance, MD Authorized by: Koleen Distance, MD   Critical care provider statement:    Critical care time (minutes):  60   Critical care time was exclusive of:  Separately billable procedures and treating other patients and teaching time   Critical care was necessary to treat or prevent imminent or life-threatening deterioration of the following conditions:  Circulatory failure, cardiac failure, renal failure, sepsis and shock   Critical care was time spent personally by me on the following activities:  Discussions with consultants, evaluation of patient's response to treatment, examination of patient, ordering and performing treatments and interventions, ordering and review of laboratory studies, ordering and review of radiographic studies, pulse oximetry, re-evaluation of patient's condition, obtaining history from patient or surrogate and review of old charts   Care  discussed with: admitting provider     Medications Ordered in ED Medications  lactated ringers infusion (has no administration in time range)  lactated ringers bolus 1,000 mL (1,000 mLs Intravenous New Bag/Given 12/08/20 1359)    And  lactated ringers bolus 1,000 mL (has no administration in time range)    And  lactated ringers bolus 1,000 mL (1,000 mLs Intravenous New Bag/Given 12/08/20 1418)  vancomycin (  VANCOCIN) IVPB 1000 mg/200 mL premix (1,000 mg Intravenous New Bag/Given 12/08/20 1409)  0.9 %  sodium chloride infusion (has no administration in time range)  norepinephrine (LEVOPHED)  in premix infusion (40 mcg/min Intravenous Rate/Dose Change 12/08/20 1424)  ceFEPIme (MAXIPIME) 2 g in sodium chloride 0.9 % 100 mL IVPB (has no administration in time range)  EPINEPHrine (ADRENALIN) 5 mg in NS 250 mL (0.02 mg/mL) premix infusion (has no administration in time range)  acetaminophen (TYLENOL) suppository 650 mg (650 mg Rectal Given 12/08/20 1426)    ED Course  I have reviewed the triage vital signs and the nursing notes.  Pertinent labs & imaging results that were available during my care of the patient were reviewed by me and considered in my medical decision making (see chart for details).  Clinical Course as of 12/10/20 1244  Fri Dec 08, 2020  1533 Hospice nurses at bedside.  [AW]  1631 I spoke with Delorise Shiner from ICU.  [AW]    Clinical Course User Index [AW] Koleen Distance, MD    Shannon Shannon [name] is an iodine allergy documented in his chart with a reaction of swelling.  He is unable to verbalize the nature of his reaction to iodine.  Radiology requests that I placed this Shannon [name] assuming responsibility for the administration of IV contrast.  I do think the risk/benefit in this situation is in favor of evaluating the patient for pulmonary embolism, acute intra-abdominal process, or other contributing factor to his sepsis.  The patient is experiencing septic shock.  I do think the  risk/benefit is in favor of further diagnosis.  I am willing to accept full responsibility for the decision to administer contrast to this patient.  MDM Rules/Calculators/A&P                          Shannon Shannon [name] resented to the ED with septic shock.  He has been resuscitated with IV fluid and pressors.  Labs, urine, and imaging are being obtained to evaluate for source.  Vasopressors agents are being titrated to maintain MAP over 65.  He has been given broad-spectrum antibiotics according to sepsis protocol.  He will be admitted to the ICU.  Final Clinical Impression(s) / ED Diagnoses Final diagnoses:  Septic shock (HCC)  Quadriplegia and quadriparesis (HCC)  Chronic indwelling Foley catheter  Stage IV pressure ulcer of sacral region (HCC)  Hematuria, unspecified type    Rx / DC Orders ED Discharge Orders     None        Koleen Distance, MD 12/10/20 1247

## 2020-12-08 NOTE — Progress Notes (Signed)
eLink Physician-Brief Progress Note Patient Name: Shannon [name] DOB: Nov 29, 1945 MRN: [id number]   Date of Service  12/08/2020  HPI/Events of Note  Patient admitted with septic shock, right lower lobe pneumonia, likely NSTEMI against a background of extremely complicated co-morbidity including chronic osteomyelitis, quadriplegia, failure to thrive, for which she was in a hospice facility.  eICU Interventions  New Patient Evaluation.        Thomasene Lot Eschol Auxier 12/08/2020, 8:32 PM

## 2020-12-08 NOTE — ED Notes (Addendum)
Critical Value- Lactic Acid-8.1  Delford Field EDP made aware.

## 2020-12-08 NOTE — ED Notes (Addendum)
Daughter at bedside.  Multiple wounds all over patient. Large wound on sacrum larger than a softball. EDP made aware.

## 2020-12-08 NOTE — ED Notes (Signed)
Family at bedside speaking with provider.

## 2020-12-08 NOTE — Progress Notes (Signed)
[id number]   [facility]) RN Hospital Liaison Note:  Patient brought in to the emergency department per daughter request for hypotension and altered mental status.   Spoke with Santina Evans, RN and Dr. Delford Field related to patient's condition.   Spoke with daughter, [name]. She desires full evaluation and treatment of any and all conditions at this time.   Initiated discussion related to opportunity to choose revocation of Hospice Medicare benefit if she desires to pursue aggressive treatments. At this time, she asks to focus on her father and his ED evaluation.   Will continue to follow patient and support daughter.   Please call with any Hospice questions and concerns.   Thank you,   Bobbie "Einar Gip, RN, BSN [facility] Liaison [phone]

## 2020-12-08 NOTE — Sepsis Progress Note (Signed)
Sepsis protocol being followed by [facility] 

## 2020-12-08 NOTE — H&P (Signed)
NAME:  Shannon [name], MRN:  [id number], DOB:  1945-08-16, LOS: 0 ADMISSION DATE:  12/08/2020, CONSULTATION DATE:  6/17 REFERRING MD:  Joya Gaskins - EM CHIEF COMPLAINT:  Unresponsive, shock   History of Present Illness:  75 yo PMH quadriplegia, neurogenic bladder, sacral osteomyelitis and stage IV sacral decubitus ulcer , bed bound, hospice patietn requiring course of vanc and ertapenem, severe protein calorie malnutrition, recent renal calculi with hematuria and hemorrhagic shock requiring admission/cysto/blood clot evac/fulguration in 10/2020 --who is a resident at [facility] and receiving hospice care-- presented to ED 12/08/20 after being found unresponsive at care facility. Usual state of health 6/16. Then on 12/08/2020 found by staff unresponsive, and hypotensive SBPs 60s on EMS arrival which did not improve with IVF and was started on an epi infusion by EMS. On arrival to the ED patient remained in shock, was started on NE infusion via PICC line (present on admission) and started on sepsis protocol receiving vanc and cefepime   LA 8.1 WBC 26 Cr 1.61 (baseline 0.4) AG 14 (prior <5) alk phos 226 AST 229 ALT 99 Trop 3292 Ddimer >20,000 . CXR with L PICC, small R pleural effusion . CT chest with RLL pneumonia (aspn v HCA); he does not have trach. Started on pressors and s/p 3L fluid by ER at time of CCM eval 12/08/2020 and more responsive. No hx of bleeing this admit. 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   reports that he  has quit smoking. He has never used smokeless tobacco.  Past Surgical History:  Procedure Laterality Date   ANTERIOR CERVICAL DECOMP/DISCECTOMY FUSION N/A 04/05/2020   Procedure: ANTERIOR CERVICAL DECOMPRESSION/DISCECTOMY FUSION, INTERBODY PROSTHESIS, PLATE/SCREWS CERVICAL THREE-CERVICAL FOUR, CERVICAL FOUR- CERVICAL FIVE;  Surgeon: Newman Pies, MD;  Location: [facility];  Service: Neurosurgery;  Laterality: N/A;   ANTERIOR CERVICAL DECOMP/DISCECTOMY FUSION N/A 04/22/2020   Procedure: Reexploration of anterior cervical wound for epidural hematoma;  Surgeon: Kary Kos, MD;  Location: [facility];  Service: Neurosurgery;  Laterality: N/A;   BACK SURGERY     CYSTOSCOPY N/A 11/13/2020   Procedure: CYSTOSCOPY, CLOT EVACUATION, FULGURATION;  Surgeon: Robley Fries, MD;  Location: [facility];  Service: Urology;  Laterality: N/A;   INCISION AND DRAINAGE Left 2012   L hand infection   IR GASTROSTOMY TUBE MOD SED  07/05/2020   ROTATOR CUFF REPAIR Right    x 3   SPINE SURGERY      Allergies  Allergen Reactions   Iodine Swelling    NOT CT CONTRAST   Penicillins Swelling    ** tolerates cephalosporins Facial swelling, itchy throat    Immunization History  Administered Date(s) Administered   PFIZER(Purple Top)SARS-COV-2 Vaccination 09/09/2019, 10/05/2019   Tdap 06/24/2010    Family History  Problem Relation Age of Onset   Heart attack Mother    Heart disease Mother    High blood pressure Mother    Alcohol abuse Father    Cancer Father    Diabetes Father    High blood pressure Father  Current Facility-Administered Medications:    0.9 %  sodium chloride infusion, 250 mL, Intravenous, Continuous, Arnaldo Natal, MD   EPINEPHrine (ADRENALIN) 5 mg in NS 250 mL (0.02 mg/mL) premix infusion, 0.5-20 mcg/min, Intravenous, Titrated, Arnaldo Natal, MD, Held at 12/08/20 1508   lactated ringers infusion, , Intravenous, Continuous, Arnaldo Natal, MD, Last Rate: 150 mL/hr at 12/08/20 1712, New Bag  at 12/08/20 1712   norepinephrine (LEVOPHED) 71m in 2544mpremix infusion, 0-40 mcg/min, Intravenous, Continuous, WrArnaldo NatalMD, Last Rate: 150 mL/hr at 12/08/20 1751, 40 mcg/min at 12/08/20 1751  Current Outpatient Medications:    albuterol (VENTOLIN HFA) 108 (90 Base) MCG/ACT inhaler, Inhale 2 puffs into the lungs daily., Disp: , Rfl:    AMINO ACIDS-PROTEIN HYDROLYS PO, Take by mouth 2 (two) times daily between meals. 17-100 gram/ Kcal/30 mL   (Also known as Pro -Stat), Disp: , Rfl:    ascorbic acid (VITAMIN C) 500 MG tablet, Take 1,000 mg by mouth daily., Disp: , Rfl:    aspirin 81 MG chewable tablet, Chew 1 tablet (81 mg total) by mouth every morning., Disp: , Rfl:    Cranberry 400 MG CAPS, Take 1 capsule by mouth daily at 6 (six) AM., Disp: , Rfl:    docusate (COLACE) 50 MG/5ML liquid, Place 100 mg into feeding tube 2 (two) times daily., Disp: , Rfl:    DULoxetine (CYMBALTA) 30 MG capsule, Take 30 mg by mouth daily., Disp: , Rfl:    ertapenem (INVANZ) 1 g injection, Inject 1 g into the muscle daily., Disp: , Rfl:    Ferrous Sulfate 220 (44 Fe) MG/5ML LIQD, Give 5 mLs by tube in the morning, at noon, and at bedtime. Via G tube, Disp: , Rfl:    Lactobacillus (PROBIOTIC ACIDOPHILUS) CAPS, Place 1 capsule into feeding tube daily., Disp: , Rfl:    midodrine (PROAMATINE) 10 MG tablet, Place 1 tablet (10 mg total) into feeding tube 3 (three) times daily., Disp: 90 tablet, Rfl: 1   Nutritional Supplements (FEEDING SUPPLEMENT, OSMOLITE 1.5 CAL,) LIQD, Place 1,000 mLs into feeding tube continuous., Disp: 60 mL, Rfl: 0   omeprazole (PRILOSEC) 20 MG capsule, Take 20 mg by mouth every other day. Per Tube, Disp: , Rfl:    oxyCODONE (OXY IR/ROXICODONE) 5 MG immediate release tablet, Take 0.5 tablets (2.5 mg total) by mouth 2 (two) times daily as needed for moderate pain or severe pain., Disp: 10 tablet, Rfl: 0   oxyCODONE-acetaminophen (PERCOCET/ROXICET) 5-325 MG tablet, Place 1 tablet into feeding tube  2 (two) times daily., Disp: 20 tablet, Rfl: 0   polyethylene glycol (MIRALAX / GLYCOLAX) 17 g packet, Place 17 g into feeding tube daily., Disp: , Rfl:    pregabalin (LYRICA) 100 MG capsule, Take 100 mg by mouth 2 (two) times daily., Disp: , Rfl:    scopolamine (TRANSDERM-SCOP) 1 MG/3DAYS, Place 1 patch onto the skin every 3 (three) days., Disp: , Rfl:    tamsulosin (FLOMAX) 0.4 MG CAPS capsule, Take 1 capsule (0.4 mg total) by mouth daily., Disp: 30 capsule, Rfl: 1   vancomycin (VANCOCIN) 750 MG SOLR injection, Inject 750 mg into the vein 2 (two) times daily., Disp: , Rfl:    zinc gluconate 50 MG tablet, Take 50 mg by mouth daily., Disp: , Rfl:    nutrition supplement, JUVEN, (JUVEN) PACK, Place 1 packet into feeding tube 2 (two) times daily between meals., Disp: , Rfl: 0   Nutritional Supplements (FEEDING SUPPLEMENT, PROSOURCE TF,) liquid, Place 45  mLs into feeding tube 2 (two) times daily., Disp: , Rfl:    pantoprazole sodium (PROTONIX) 40 mg/20 mL PACK, Place 20 mLs (40 mg total) into feeding tube daily. (Patient not taking: Reported on 12/08/2020), Disp: 600 mL, Rfl: 1   Significant Hospital Events: Including procedures, antibiotic start and stop dates in addition to other pertinent events   6/17 unresponsive at [facility]. On EMS arrival, SBPs 60-- refractory to Ivf and started on epi. Changed to NE in ED. Started on vanc, cefepime. Is a hospice pt but family is contemplating revoking this.   Interim History / Subjective:   6/17 - seen in [place] and follows some commands. Weak flicker movement on uppers. PRotecting airway. CTA bilarlly. Abd soft. Normal heart sounds  Objective   Blood pressure (!) 84/55, pulse (!) 127, temperature (!) 100.7 F (38.2 C), temperature source Rectal, resp. rate 18, SpO2 99 %.        Intake/Output Summary (Last 24 hours) at 12/08/2020 1637 Last data filed at 12/08/2020 1513 Gross per 24 hour  Intake 1212.33 ml  Output --  Net 1212.33 ml    There were no vitals filed for this visit.  Examination: mumbles and follows some commands. Weak flicker movement on uppers. PRotecting airway. CTA bilarlly. Abd soft. Normal heart sounds. Abd soft. Lowers in [place]   Labs/imaging that I havepersonally reviewed  (right click and "Reselect all SmartList Selections" daily)   CMP - Cr 1.61 AG 14 Alk phos 226 AST 229 ALT 99 tbili 1.5  Trop I - 3292 Ddimer >20,000  LA 8.1  CBC - WBC 26.1 hgb 9.6 plt 148  INR 1.5  UA- >50 RBCs moderate leukocytes few bacteria     LABS    PULMONARY No results for input(s): PHART, PCO2ART, PO2ART, HCO3, TCO2, O2SAT in the last 168 hours.  Invalid input(s): PCO2, PO2  CBC Recent Labs  Lab 12/08/20 1351  HGB 9.6*  HCT 32.8*  WBC 26.2*  PLT 148*    COAGULATION Recent Labs  Lab 12/08/20 1351  INR 1.5*    CARDIAC  No results for input(s): TROPONINI in the last 168 hours. No results for input(s): PROBNP in the last 168 hours.   CHEMISTRY Recent Labs  Lab 12/08/20 1351  NA 139  K 4.1  CL 100  CO2 25  GLUCOSE 97  BUN 37*  CREATININE 1.61*  CALCIUM 8.8*   CrCl cannot be calculated (Unknown ideal weight.).   LIVER Recent Labs  Lab 12/08/20 1351  AST 229*  ALT 99*  ALKPHOS 226*  BILITOT 1.5*  PROT 7.4  ALBUMIN 2.2*  INR 1.5*     INFECTIOUS Recent Labs  Lab 12/08/20 1351 12/08/20 1715  LATICACIDVEN 8.1* 7.3*     ENDOCRINE CBG (last 3)  Recent Labs    12/08/20 1356  GLUCAP 80         IMAGING x48h  - image(s) personally visualized  -   highlighted in bold CT Head Wo Contrast  Result Date: 12/08/2020 CLINICAL DATA:  Altered mental status EXAM: CT HEAD WITHOUT CONTRAST TECHNIQUE: Contiguous axial images were obtained from the base of the skull through the vertex without intravenous contrast. COMPARISON:  05/21/2020 FINDINGS: Brain: No evidence of acute infarction, hemorrhage, hydrocephalus, extra-axial collection or mass lesion/mass effect. Cavum  septum pellucidum and cavum vergae incidentally noted. Mild low-density changes within the periventricular and subcortical white matter compatible with chronic microvascular ischemic change. Mild diffuse cerebral volume loss. Vascular: Atherosclerotic calcifications involving  the large vessels of the skull base. No unexpected hyperdense vessel. Skull: Normal. Negative for fracture or focal lesion. Sinuses/Orbits: Partial bilateral mastoid opacification. Paranasal sinuses are clear. Other: None. IMPRESSION: 1. No acute intracranial findings. 2. 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 3. Partial bilateral mastoid opacification. Electronically Signed   By: Davina Poke D.O.   On: 12/08/2020 17:18   CT Angio Chest PE W and/or Wo Contrast  Result Date: 12/08/2020 CLINICAL DATA:  Abdominal pain.  Fever. EXAM: CT ANGIOGRAPHY CHEST CT ABDOMEN AND PELVIS WITH CONTRAST TECHNIQUE: Multidetector CT imaging of the chest was performed using the standard protocol during bolus administration of intravenous contrast. Multiplanar CT image reconstructions and MIPs were obtained to evaluate the vascular anatomy. Multidetector CT imaging of the abdomen and pelvis was performed using the standard protocol during bolus administration of intravenous contrast. CONTRAST:  18m OMNIPAQUE IOHEXOL 350 MG/ML SOLN COMPARISON:  CT stone study 11/12/2020 FINDINGS: CTA CHEST FINDINGS Cardiovascular: Heart size is normal. Aorta is within normal limits. Pulmonary artery opacification is excellent. No focal filling defects are present to suggest pulmonary emboli. Mediastinum/Nodes: No significant mediastinal, hilar, or axillary adenopathy is present. Esophagus is within normal limits. Thoracic inlet is unremarkable. Lungs/Pleura: Asymmetric right lower lobe airspace disease is present. Small effusions are present bilaterally. There is some dependent atelectasis on the left. Mild dependent atelectasis is present in the  upper lobes bilaterally. No pneumothorax is present. Musculoskeletal: Vertebral body heights and alignment are maintained. Thoracic kyphosis normal. Review of the MIP images confirms the above findings. CT ABDOMEN and PELVIS FINDINGS Hepatobiliary: Gallstone again noted. No inflammatory changes associated. Liver is unremarkable. Pancreas: Unremarkable. No pancreatic ductal dilatation or surrounding inflammatory changes. Spleen: Normal in size without focal abnormality. Adrenals/Urinary Tract: Adrenal glands are normal bilaterally. Bilateral nonobstructing nephrolithiasis is stable. No mass lesion present. The left ureter remains dilated with some inflammation. Stone is present at the UVJ. Foley catheter is present in the urinary bladder. Stomach/Bowel: Peg tube place. Moderate fluid is present within the stomach. Inflammatory change present. Small bowel unremarkable. Terminal ileum within normal limits. Mobile cecum noted. Ascending and transverse colon are within normal limits. Descending colon unremarkable. Diverticula are present in the descending and sigmoid colon without inflammation. Vascular/Lymphatic: Atherosclerotic calcifications are present in the aorta and branch vessels without aneurysm. No significant adenopathy is present. Reproductive: Prostate is unremarkable. Other: There is some stranding in the subcutaneous fat. No free fluid or free air is present. No significant ventral hernias are present. Musculoskeletal: Endplate changes of the lumbar spine are most pronounced at L1-2 anteriorly. Disc disease at L4-5 contributes to bilateral foraminal narrowing. Moderate degenerative changes are noted in the hips bilaterally. No focal lytic or blastic lesions are present. Review of the MIP images confirms the above findings. IMPRESSION: 1. No pulmonary embolus. 2. Right lower lobe airspace disease, concerning for pneumonia. 3. Small bilateral pleural effusions and associated atelectasis. 4. Stable bilateral  nonobstructing nephrolithiasis. 5. Cholelithiasis without evidence for cholecystitis. 6. 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 7. Degenerative changes of the lumbar spine are most pronounced at L1-2 and L4-5. 8. Aortic Atherosclerosis (ICD10-I70.0). Electronically Signed   By: CSan MorelleM.D.   On: 12/08/2020 17:32   CT Abdomen Pelvis W Contrast  Result Date: 12/08/2020 CLINICAL DATA:  Abdominal pain.  Fever. EXAM: CT ANGIOGRAPHY CHEST CT ABDOMEN AND PELVIS WITH CONTRAST TECHNIQUE: Multidetector CT imaging of the chest was performed using the standard protocol during bolus administration of intravenous contrast. Multiplanar CT image  reconstructions and MIPs were obtained to evaluate the vascular anatomy. Multidetector CT imaging of the abdomen and pelvis was performed using the standard protocol during bolus administration of intravenous contrast. CONTRAST:  27m OMNIPAQUE IOHEXOL 350 MG/ML SOLN COMPARISON:  CT stone study 11/12/2020 FINDINGS: CTA CHEST FINDINGS Cardiovascular: Heart size is normal. Aorta is within normal limits. Pulmonary artery opacification is excellent. No focal filling defects are present to suggest pulmonary emboli. Mediastinum/Nodes: No significant mediastinal, hilar, or axillary adenopathy is present. Esophagus is within normal limits. Thoracic inlet is unremarkable. Lungs/Pleura: Asymmetric right lower lobe airspace disease is present. Small effusions are present bilaterally. There is some dependent atelectasis on the left. Mild dependent atelectasis is present in the upper lobes bilaterally. No pneumothorax is present. Musculoskeletal: Vertebral body heights and alignment are maintained. Thoracic kyphosis normal. Review of the MIP images confirms the above findings. CT ABDOMEN and PELVIS FINDINGS Hepatobiliary: Gallstone again noted. No inflammatory changes associated. Liver is unremarkable. Pancreas: Unremarkable. No pancreatic ductal dilatation or  surrounding inflammatory changes. Spleen: Normal in size without focal abnormality. Adrenals/Urinary Tract: Adrenal glands are normal bilaterally. Bilateral nonobstructing nephrolithiasis is stable. No mass lesion present. The left ureter remains dilated with some inflammation. Stone is present at the UVJ. Foley catheter is present in the urinary bladder. Stomach/Bowel: Peg tube place. Moderate fluid is present within the stomach. Inflammatory change present. Small bowel unremarkable. Terminal ileum within normal limits. Mobile cecum noted. Ascending and transverse colon are within normal limits. Descending colon unremarkable. Diverticula are present in the descending and sigmoid colon without inflammation. Vascular/Lymphatic: Atherosclerotic calcifications are present in the aorta and branch vessels without aneurysm. No significant adenopathy is present. Reproductive: Prostate is unremarkable. Other: There is some stranding in the subcutaneous fat. No free fluid or free air is present. No significant ventral hernias are present. Musculoskeletal: Endplate changes of the lumbar spine are most pronounced at L1-2 anteriorly. Disc disease at L4-5 contributes to bilateral foraminal narrowing. Moderate degenerative changes are noted in the hips bilaterally. No focal lytic or blastic lesions are present. Review of the MIP images confirms the above findings. IMPRESSION: 1. No pulmonary embolus. 2. Right lower lobe airspace disease, concerning for pneumonia. 3. Small bilateral pleural effusions and associated atelectasis. 4. Stable bilateral nonobstructing nephrolithiasis. 5. Cholelithiasis without evidence for cholecystitis. 6. 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 7. Degenerative changes of the lumbar spine are most pronounced at L1-2 and L4-5. 8. Aortic Atherosclerosis (ICD10-I70.0). Electronically Signed   By: CSan MorelleM.D.   On: 12/08/2020 17:32   DG Chest Port 1 View  Result  Date: 12/08/2020 CLINICAL DATA:  Possible sepsis EXAM: PORTABLE CHEST 1 VIEW COMPARISON:  07/04/2020 FINDINGS: Cardiac shadow is mildly enlarged but stable. Left-sided PICC line is noted at the cavoatrial junction. The overall inspiratory effort is poor. Stable small pleural effusion on the right is noted. No acute abnormality noted. IMPRESSION: Chronic changes in the right lung base with small effusion. No focal infiltrate is noted. Electronically Signed   By: MInez CatalinaM.D.   On: 12/08/2020 15:09     Resolved Hospital Problem list     Assessment & Plan:   ASSESSMENT / PLAN:   A:  Baseline Quadriplegia without tracheostomy - puts him at risk for acute respiratory failure in setting of sepsi and acute metabolic encephalopathy  67/59/1638-> in ER protecting airway as he is more arousabld  P:   Monitor closely Pulse ox goal > 88%    A:   Baseline: bed bound  Quadriplegia - prior to admission Present acutely on admission: Acute metabolic obtunded encephalopathy due to sepsis  6/17 - impriving in ER following BP resuscitation  P:   Monitor closely NPO till dysphagia sorted out     A:   Has chronic PICC (early April for osteo) Circulatory shock due to septic shock needing pressors - present on admission (on midodrine at baseline)  6/17 - in ER s.p 3L fluids and on levophed   P:  Levophed for MAP goal > 65   A: Chronic Grad1 Diast Dysfunction  - echo May 2022 Current concern for NSTEMI  - possbly type 2 = @ admission  P: Monitor Cycle enzymes Get ECHO Hold off IV heparin for now (had hematuria massive in May 2022)   A: Sinus tachycardia - at admission. No EKG findings of MI at admit   P: Monitor tele   A:   History of Sacral Osteomyeltiis - April 2022 - associated with chronic baseline decub SEvere sepsis (SIRS + lactic acidosis + Pneumonia on RLL on CT) with septic shock - at admit  P:   Vanc 6/17 Cefepime 6/17 Might need ID consult    A:   Has chronic indwelling foley - at risk for recurrnet UTI Has baseline non obstructive nephrolithiasis Baseline creat 0.23m%. Current - creat 1.655m - c/w Acute Renal Failure given > 3X rise - c/w ATN so far - at admit  P:  Fluids and monitor     A:  Severe lactic acidosis - present on admisson with 10% clearance only in ER  P: Fluids and moitor Replete as needed    A:   Baseline Chronic PEG at baseline - prior to admit. Severe Protein Calorie Malnutrition at admit Aspiration risk at admit Aysmoptomatic cholelithsiasis - baseline problem at admit on CT  Acute Transaminitis - new problem at admit  P:   TF via PEG NPO Monitor   A:  Chronic anemia at baseline (mid 8s). Ciurrently 9s of chronic disease - present at admit     P:  - PRBC for hgb </= 6.9gm%    - exceptions are   -  if ACS susepcted/confirmed then transfuse for hgb </= 8.0gm%,  or    -  active bleeding with hemodynamic instability, then transfuse regardless of hemoglobin value   At at all times try to transfuse 1 unit prbc as possible with exception of active hemorrhage    A Mild thromboyctopenia - acute present on admit. Likeluy due to sepsis (baseline high 100-200s)  P Heparin for SQ proph Monitor   A:   At risk for hypo and hyperglycemia    P:   D5LR at 75cc SSI  MSK/DERM Stagee 4 sacral decub at baseline Chronic pain Opioid dependence with depression - on lyrica, percocet, oxycodoen, cymbalta - baseline prior to admit  Plan  Woc consult Monitor for opioid withdrawal  CODE Baseline Hospice/SNF patient - at admit Full Code at admit  Plan  Ongoing goals of care    Best Practice (right click and "Reselect all SmartList Selections" daily)   Diet/type: NPO Pain/Anxiety/Delirium protocol Not indicated VAP protocol (if indicated): Not indicated DVT prophylaxis: prophylactic heparin  GI prophylaxis: PPI Glucose control:  SSI Central venous access:  Yes, and it is still  needed Arterial line:  N/A Foley:  Yes, and it is still needed Mobility:  bed rest  PT consulted: N/A Studies pending: None Culture data pending:urine  and blood Last reviewed culture data:today Antibiotics:cefepime and vanc Antibiotic  de-escalation: no,  continue current rx Stop date: to be determined  Daily labs: requested Code Status:  full code Last date of multidisciplinary goals of care discussion [family NA a bedside  - d/w Dr Sherlean Foot [phone] and updated - daughter expressed full code and to call for any changes any time] ccm prognosis: Life-threating Disposition: admit to the intensive care      [place]   The patient Shannon [name] is critically ill with multiple organ systems failure and requires high complexity decision making for assessment and support, frequent evaluation and titration of therapies, application of advanced monitoring technologies and extensive interpretation of multiple databases.   Critical Care Time devoted to patient care services described in this note is  60  Minutes. This time reflects time of care of this signee Dr Brand Males. This critical care time does not reflect procedure time, or teaching time or supervisory time of PA/NP/Med student/Med Resident etc but could involve care discussion time     Dr. Brand Males, M.D., [facility].C.P Pulmonary and Critical Care Medicine Staff Physician [facility] Pager: [phone], If no answer or between  15:00h - 7:00h: call [phone]  12/08/2020 6:21 PM

## 2020-12-08 NOTE — ED Notes (Signed)
PATIENT IS IN CT SCAN WITH THE NURSE DUE  TO VITALS

## 2020-12-08 NOTE — ED Notes (Signed)
ICU called and notified, per Asher Muir, RN hold patient until shift change for available nurse to resume care of patient in ICU.

## 2020-12-09 ENCOUNTER — Inpatient Hospital Stay (HOSPITAL_COMMUNITY)

## 2020-12-09 DIAGNOSIS — I9589 Other hypotension: Secondary | ICD-10-CM

## 2020-12-09 DIAGNOSIS — R778 Other specified abnormalities of plasma proteins: Secondary | ICD-10-CM

## 2020-12-09 DIAGNOSIS — R6521 Severe sepsis with septic shock: Secondary | ICD-10-CM | POA: Diagnosis not present

## 2020-12-09 DIAGNOSIS — A419 Sepsis, unspecified organism: Secondary | ICD-10-CM | POA: Diagnosis not present

## 2020-12-09 LAB — BLOOD GAS, ARTERIAL
Acid-Base Excess: 2.5 mmol/L — ABNORMAL HIGH (ref 0.0–2.0)
Acid-Base Excess: 2.2 mmol/L — ABNORMAL HIGH (ref 0.0–2.0)
Bicarbonate: 27.4 mmol/L (ref 20.0–28.0)
Bicarbonate: 26.4 mmol/L (ref 20.0–28.0)
Drawn by: 59133
FIO2: 80
FIO2: 100
O2 Content: 15 L/min
O2 Saturation: 99.8 %
O2 Saturation: 87.9 %
Patient temperature: 100.7
Patient temperature: 99.9
pCO2 arterial: 47.1 mmHg (ref 32.0–48.0)
pCO2 arterial: 43.6 mmHg (ref 32.0–48.0)
pH, Arterial: 7.383 (ref 7.350–7.450)
pH, Arterial: 7.404 (ref 7.350–7.450)
pO2, Arterial: 191 mmHg — ABNORMAL HIGH (ref 83.0–108.0)
pO2, Arterial: 56.8 mmHg — ABNORMAL LOW (ref 83.0–108.0)

## 2020-12-09 LAB — DIFFERENTIAL
Band Neutrophils: 21 %
Basophils Relative: 0 %
Blasts: NONE SEEN %
Eosinophils Relative: 0 %
Lymphocytes Relative: 0 %
Metamyelocytes Relative: 10 %
Monocytes Relative: 5 %
Myelocytes: 4 %
Neutrophils Relative %: 60 %
Promyelocytes Relative: NONE SEEN %
RBC Morphology: NORMAL
nRBC: NONE SEEN /100 WBC

## 2020-12-09 LAB — ECHOCARDIOGRAM COMPLETE
Calc EF: 47.5 %
Height: 67 in
S' Lateral: 3.5 cm
Single Plane A2C EF: 50 %
Single Plane A4C EF: 45.9 %
Weight: 3132.3 oz

## 2020-12-09 LAB — MAGNESIUM: Magnesium: 1.8 mg/dL (ref 1.7–2.4)

## 2020-12-09 LAB — CBC
HCT: 35.1 % — ABNORMAL LOW (ref 39.0–52.0)
Hemoglobin: 10.3 g/dL — ABNORMAL LOW (ref 13.0–17.0)
MCH: 26 pg (ref 26.0–34.0)
MCHC: 29.3 g/dL — ABNORMAL LOW (ref 30.0–36.0)
MCV: 88.6 fL (ref 80.0–100.0)
Platelets: 156 10*3/uL (ref 150–400)
RBC: 3.96 MIL/uL — ABNORMAL LOW (ref 4.22–5.81)
RDW: 16.5 % — ABNORMAL HIGH (ref 11.5–15.5)
WBC: 49.4 10*3/uL — ABNORMAL HIGH (ref 4.0–10.5)
nRBC: 0 % (ref 0.0–0.2)

## 2020-12-09 LAB — COMPREHENSIVE METABOLIC PANEL
ALT: 91 U/L — ABNORMAL HIGH (ref 0–44)
AST: 195 U/L — ABNORMAL HIGH (ref 15–41)
Albumin: 2.2 g/dL — ABNORMAL LOW (ref 3.5–5.0)
Alkaline Phosphatase: 211 U/L — ABNORMAL HIGH (ref 38–126)
Anion gap: 14 (ref 5–15)
BUN: 35 mg/dL — ABNORMAL HIGH (ref 8–23)
CO2: 22 mmol/L (ref 22–32)
Calcium: 8.6 mg/dL — ABNORMAL LOW (ref 8.9–10.3)
Chloride: 98 mmol/L (ref 98–111)
Creatinine, Ser: 1.04 mg/dL (ref 0.61–1.24)
GFR, Estimated: >60 mL/min (ref >60)
Glucose, Bld: 84 mg/dL (ref 70–99)
Potassium: 4.7 mmol/L (ref 3.5–5.1)
Sodium: 134 mmol/L — ABNORMAL LOW (ref 135–145)
Total Bilirubin: 1.2 mg/dL (ref 0.3–1.2)
Total Protein: 7.9 g/dL (ref 6.5–8.1)

## 2020-12-09 LAB — LACTIC ACID, PLASMA
Lactic Acid, Venous: 2.8 mmol/L (ref 0.5–1.9)
Lactic Acid, Venous: 7 mmol/L (ref 0.5–1.9)

## 2020-12-09 LAB — URINE CULTURE

## 2020-12-09 LAB — PHOSPHORUS: Phosphorus: 3 mg/dL (ref 2.5–4.6)

## 2020-12-09 LAB — PROTIME-INR
INR: 1.6 — ABNORMAL HIGH (ref 0.8–1.2)
Prothrombin Time: 19.4 seconds — ABNORMAL HIGH (ref 11.4–15.2)

## 2020-12-09 IMAGING — DX DG CHEST 1V PORT
1 series · 1 of 1 positions shown · non-contrast
Comparison: Portable chest [DATE]. Chest CT [DATE] and
earlier.

CLINICAL DATA: 74-year-old male with pneumonia.

EXAM:
PORTABLE CHEST 1 VIEW

[chest ap]
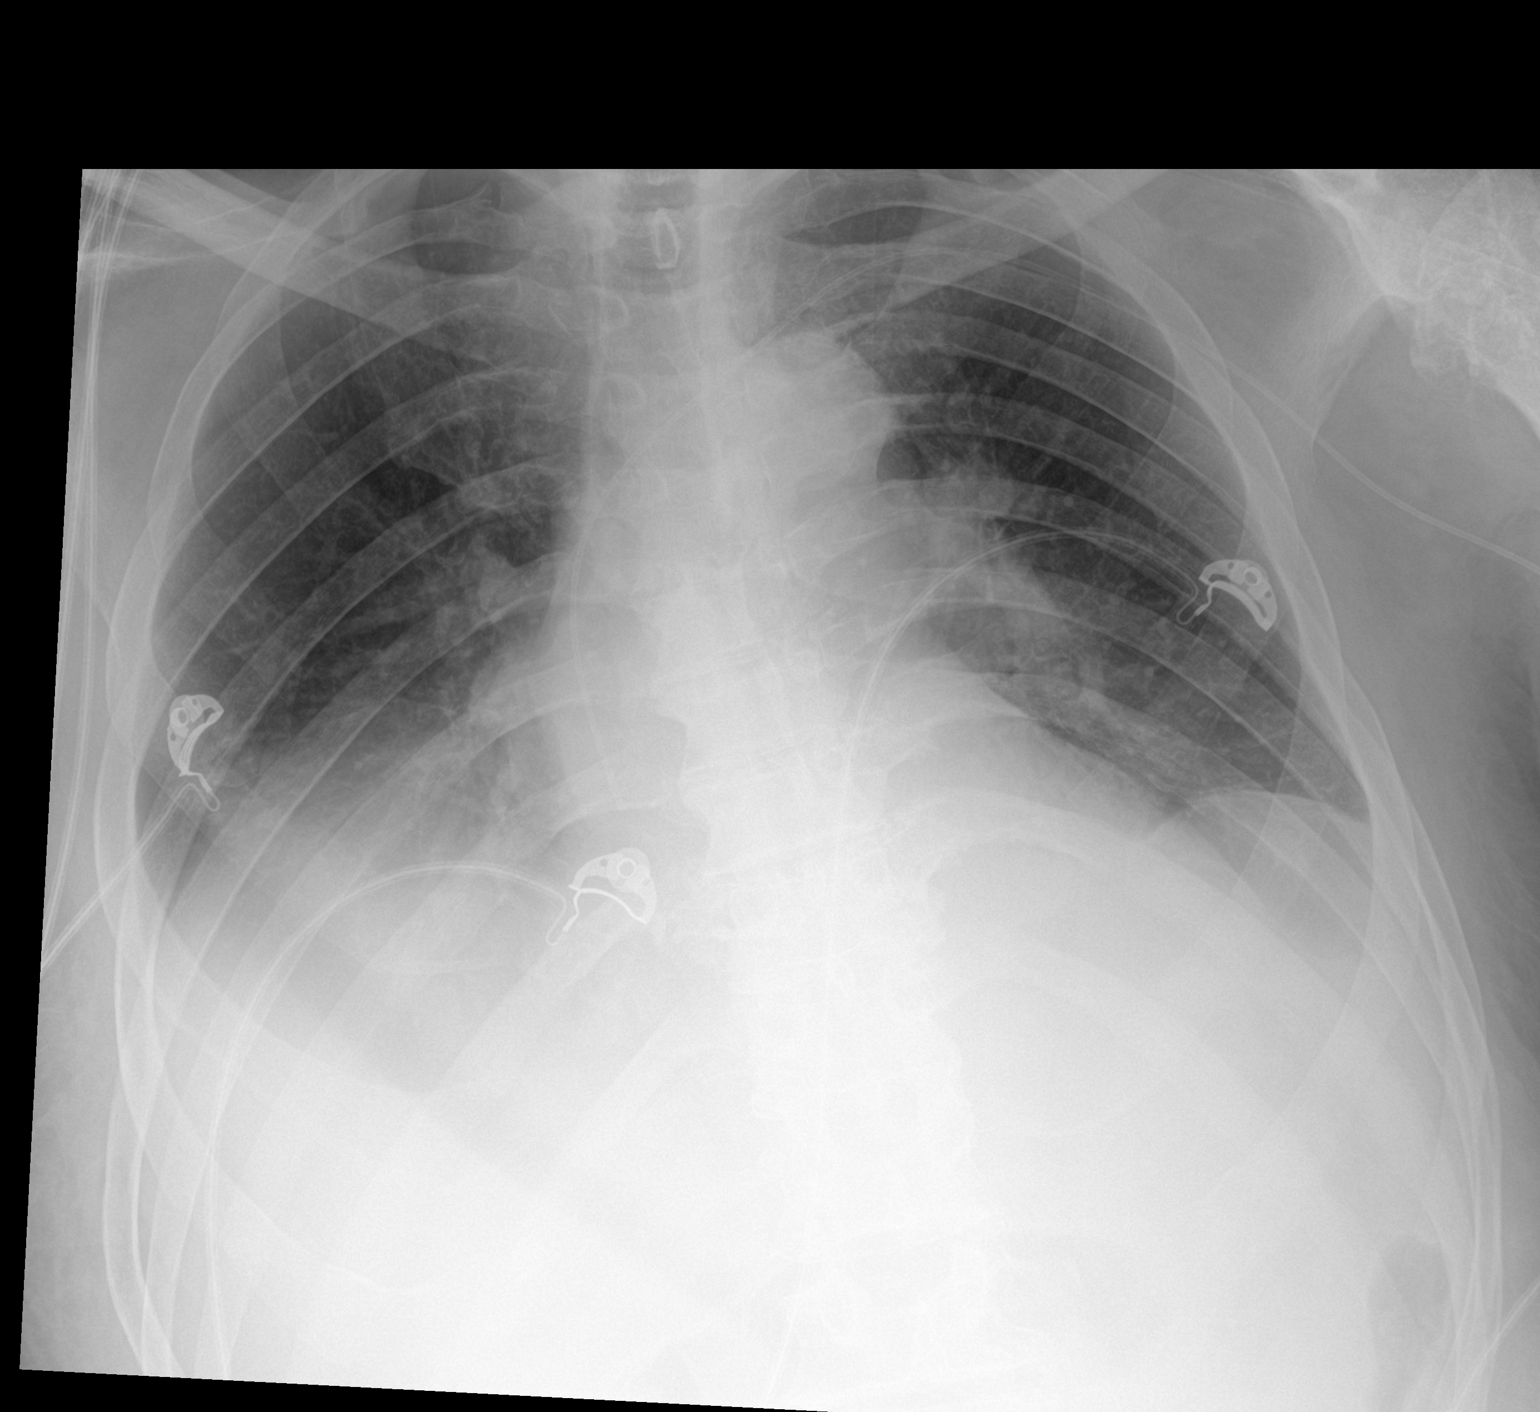

[1 of 1 positions shown; findings below may reference images not displayed]

FINDINGS: Portable AP semi upright view at [FA] hours. Increased veiling
opacity at the right lung base. Continued low lung volumes. Stable
cardiac size and mediastinal contours. Stable left PICC line. Mild
confluent opacity at the medial left base, retrocardiac. No
pneumothorax or pulmonary edema. Paucity of bowel gas in the upper
abdomen. No acute osseous abnormality identified. Severe chronic
changes at the left shoulder.
IMPRESSION: 1. Low lung volumes with increasing right lung base veiling opacity
more resembling pleural effusion than pneumonia.
2. Probable atelectasis at the left lung base, but infection there
also difficult to exclude.

## 2020-12-09 MED ORDER — PERFLUTREN LIPID MICROSPHERE
1.0000 mL | INTRAVENOUS | Status: AC | PRN
Start: 2020-12-09 — End: 2020-12-09
  Administered 2020-12-09: 4 mL via INTRAVENOUS
  Filled 2020-12-09: qty 10

## 2020-12-09 MED ORDER — CHLORHEXIDINE GLUCONATE CLOTH 2 % EX PADS
6.0000 | MEDICATED_PAD | Freq: Every day | CUTANEOUS | Status: DC
  Administered 2020-12-09 – 2020-12-21 (×13): 6 via TOPICAL

## 2020-12-09 MED ORDER — LACTATED RINGERS IV BOLUS
1000.0000 mL | Freq: Once | INTRAVENOUS | Status: AC
  Administered 2020-12-09: 1000 mL via INTRAVENOUS

## 2020-12-09 MED ORDER — SODIUM CHLORIDE 0.9 % IV SOLN
1.0000 g | Freq: Three times a day (TID) | INTRAVENOUS | Status: DC
  Administered 2020-12-09 – 2020-12-21 (×35): 1 g via INTRAVENOUS
  Filled 2020-12-09 (×37): qty 1

## 2020-12-09 MED ORDER — VANCOMYCIN HCL 1250 MG/250ML IV SOLN
1250.0000 mg | INTRAVENOUS | Status: DC
  Administered 2020-12-10 – 2020-12-13 (×4): 1250 mg via INTRAVENOUS
  Filled 2020-12-09 (×4): qty 250

## 2020-12-09 MED ORDER — MAGNESIUM SULFATE 2 GM/50ML IV SOLN: 2.0000 g | Freq: Once | INTRAVENOUS | Status: DC

## 2020-12-09 NOTE — Progress Notes (Signed)
  Echocardiogram 2D Echocardiogram with definity has been performed.  Leta Jungling M 12/09/2020, 8:49 AM

## 2020-12-09 NOTE — Progress Notes (Addendum)
WL [id number]  [facility]) [facility] Liaison Note:  Patient is a current hospice patient with [facility]. Admitted to Hospice services on 10/27/20 with a terminal diagnosis of abnormal weight loss. On 12/08/2020, patient was found by staff at [facility] unresponsive, and hypotensive with SBPs 60s. Transported by EMS to [facility] ED per daughter's request. On arrival to the ED patient remained in shock and was started on NE infusion via PICC line and started on sepsis protocol receiving vancomycin and cefepime.   Report exchanged with Dr. Marchelle Gearing and Clovis Riley, RN. Visited the patient at bedside. No family at bedside. He responds to voice, but is lethargic. When asked about pain, he attempts to verbalize, but too weak to understand. No grimacing. Respirations labored. NRB in place. Foley catheter draining amber urine. Edema to all extremities.    This pt is appropriate for inpatient services due to the need for IV medications for hypotension and infection.    V/S: 99.9 axillary, 109/63, HR 127, RR 22, SPO2 96% on NRB    I/O:  3995.01/2059   Labs: Sodium 134, BUN 35, Calcium 8.6, Albumin 2.2, WBC 49.4, RBC 3.96, Hgb 10.3, Hct 35.1, PT 19.4, INR 1.6, Lactic Acid 8.1, D-Dimer >20   Diagnostics: EXAM: PORTABLE CHEST 1 VIEW IMPRESSION: Chronic changes in the right lung base with small effusion. No focal infiltrate is noted.  Electronically Signed   By: Alcide Clever M.D.   On: 12/08/2020 15:09   EXAM: CT ANGIOGRAPHY CHEST   CT ABDOMEN AND PELVIS WITH CONTRAST  IMPRESSION: 1. No pulmonary embolus. 2. Right lower lobe airspace disease, concerning for pneumonia. 3. Small bilateral pleural effusions and associated atelectasis. 4. Stable bilateral nonobstructing nephrolithiasis. 5. Cholelithiasis without evidence for cholecystitis. 6. 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 7. Degenerative changes of the lumbar spine are most pronounced at L1-2 and L4-5. 8. Aortic  Atherosclerosis (ICD10-I70.0).   Electronically Signed   By: Marin Roberts M.D.   On: 12/08/2020 17:32  EXAM: CT HEAD WITHOUT CONTRAST  IMPRESSION: 1. No acute intracranial findings. 2. 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 3. Partial bilateral mastoid opacification.   Electronically Signed   By: Duanne Guess D.O.   On: 12/08/2020 17:18  IV/PRN meds: D5LR @ 75 ml/hr , meropenem 1g every 8h, norepinephrine 36 mcg/min, vancomycin 1250 mg every 24h  Problem List: Baseline Quadriplegia without tracheostomy - puts him at risk for acute respiratory failure in setting of sepsi and acute metabolic encephalopathy 12/09/2020 -> he has developed acute hypoxemic respiratory failure with admission present on admission.  15 L facemask Monitor closely Pulse ox goal > 88% Check blood gas to determine intubation needed.  Baseline: bed bound Quadriplegia - prior to admission Present acutely on admission: Acute metabolic obtunded encephalopathy due to sepsis Sick/18/22: Much improved Monitor closely NPO till dysphagia sorted out  Has chronic PICC (early April for osteo) Circulatory shock due to septic shock needing pressors - present on admission (on midodrine at baseline) 6-18/2022: Persistent circulatory shock requiring Levophed through the left upper extremity PICC line Levophed for MAP goal > 65  Chronic Grad1 Diast Dysfunction  - echo May 2022 Current concern for NSTEMI  - possbly type 2 = @ admission 12/09/2020: Echo done results pending Monitor Cycle enzymes Hold off IV heparin for now (had hematuria massive in May 2022)   Sinus tachycardia - at admission. No EKG findings of MI at admit  Sick/18/22: Ongoing heart rate 140.  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  Cultures pending but etiology likely due to osteomyelitis and antibiotic stoppage end of May 2022 Vanc 6/17 Cefepime 6/17 -618 Merrem (in lieu of ertapenemm) for prior osteo 6/18 >> Change antibiotics to reflect osteomyelitis previous treatment. Might need ID consult   Has chronic indwelling foley - at risk for recurrnet UTI Has baseline non obstructive nephrolithiasis Baseline creat 0.35mg %. Current - creat 1.6mg % - c/w Acute Renal Failure given > 3X rise - c/w ATN so far - at admit 12/09/2020: AKI resolved.  Fluids and monitor   Severe lactic acidosis - present on admisson with 10% clearance only in ER 6/18 - no clearing Fluids and moitor - reeat bolus Chagne abx Replete as needed  Baseline Chronic PEG at baseline - prior to admit. Severe Protein Calorie Malnutrition at admit Aspiration risk at admit Aysmoptomatic cholelithsiasis - baseline problem at admit on CT Desc colon diverticulosis - without diverticulitis on CT at admit   Acute Shock liver new problem at admit 12/09/2020 - improved  TF via PEG NPO Monitor PPI   Chronic anemia at baseline (mid 8s). Ciurrently 9s of chronic disease - present at admit  12/09/2020 - no active bleed  - PRBC for hgb </= 6.9gm%               - exceptions are                         -  if ACS susepcted/confirmed then transfuse for hgb </= 8.0gm%,  or                         -  active bleeding with hemodynamic instability, then transfuse regardless of hemoglobin value              At at all times try to transfuse 1 unit prbc as possible with exception of active hemorrhage  Mild thromboyctopenia - acute present on admit. 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   Family Contact: Spoke with daughter [name] on the phone. She states that she continues to desire for her father to receive full evaluation and treatment for any and all conditions at this time. Continued discussions related to opportunity to choose revocation of Hospice Medicare benefit if she desires to pursue aggressive treatments. Stated she could not commit to a specific time to meet at the hospital  to fill out paperwork.    IDT: Hospice team updated   Goals of Care: Full code; daughter continues to desire all measures taken to treat any and all conditions.    Thank you,   Bobbie "Einar Gip, RN, BSN [facility] Liaison [phone]

## 2020-12-09 NOTE — Progress Notes (Signed)
Pharmacy Antibiotic Note  Shannon [name] is a 75 y.o. male admitted on 12/08/2020 with septic shock.  Pharmacy has been consulted for cefepime and vancomycin dosing for pneumonia.  IV antibiotics administered in ED: Cefepime 2 g Vancomycin 1 g  Plan: Cefepime to Meropenem 1gm q8 per CCM request Pt previously on Ertapenem 1gm q24 at SNF Adjust Vancomycin to 1250mg  q24, AUC 533, SCr 1.04, Vd 0.5  Height: 5\' 7"  (170.2 cm) Weight: 88.8 kg (195 lb 12.3 oz) IBW/kg (Calculated) : 66.1  Temp (24hrs), Avg:100.2 F (37.9 C), Min:98.1 F (36.7 C), Max:101.7 F (38.7 C)  Recent Labs  Lab 12/08/20 1351 12/08/20 1715 12/08/20 2114 12/09/20 0011 12/09/20 0324  WBC 26.2*  --   --   --  49.4*  CREATININE 1.61*  --   --   --  1.04  LATICACIDVEN 8.1* 7.3* 9.7* 7.0*  --      Estimated Creatinine Clearance: 66.3 mL/min (by C-G formula based on SCr of 1.04 mg/dL).   Estimated creatinine clearance 41 ml/min  Allergies  Allergen Reactions   Iodine Swelling    NOT CT CONTRAST   Penicillins Swelling    ** tolerates cephalosporins Facial swelling, itchy throat    Antimicrobials this admission: 6/17 Cefepime >> 6/18 6/17 Vanc >> 6/18 Meropenem >> Dose adjustments this admission: 6/18 Vanc 750mg  q24 > 1250mg  q24  Microbiology results: 6/17 BCx: sent 6/17 UCx: sent  11/13/20 MRSA PCR: positive  Thank you for allowing pharmacy to be a part of this patient's care.  , PharmD 12/09/2020 12:47 PM

## 2020-12-09 NOTE — Progress Notes (Addendum)
eLink Physician-Brief Progress Note Patient Name: Shannon [name] DOB: 07-24-1945 MRN: [id number]   Date of Service  12/09/2020  HPI/Events of Note  Patient is somnolent but does intermittently attempt to respond to questions, however I could not get him to respond enough to clearly establish whether he would accept intubation / mechanical ventilation if it came to it, I am awaiting ABG results, I attempted to call his daughter to discuss goals of care but it went to voicemail, and although I left a message asking to be called I have not heard from the daughter.  eICU Interventions  Will attempt to call patient's daughter again, awaiting ABG result. ADDENDUM ABG reflects acceptable oxygenation and ventilation at this time. No indication for emergent intubation.        Thomasene Lot Bernadene Garside 12/09/2020, 5:59 AM

## 2020-12-09 NOTE — H&P (Signed)
NAME:  Shannon [name], MRN:  [id number], DOB:  [birth year], LOS: 1 ADMISSION DATE:  12/08/2020, CONSULTATION DATE:  6/17 REFERRING MD:  Joya Gaskins - EM CHIEF COMPLAINT:  Unresponsive, shock   BRIEF  75 yo PMH quadriplegia, neurogenic bladder, sacral osteomyelitis and stage IV sacral decubitus ulcer , bed bound,  Long  term resident of  [facility], severe protein calorie malnutrition. Hospice at [facility] was initiated early May 2022. Then, with hematuria and hemorrhagic shock  due to traumatic foley at [facility] requiring admission/cysto/blood clot evac/fulguration. Admitted 5/22 - 11/18/20  and discharged back to [facility]. Due to hospice status abx for osteo were stopped (vanc and ertapenem) continued receiving hospice care though as full code. He then presented to ED 12/08/20 after being found unresponsive at care facility. Usual state of health 6/16. Then on 12/08/2020 found by staff unresponsive, and hypotensive SBPs 60s on EMS arrival which did not improve with IVF and was started on an epi infusion by EMS. On arrival to the ED patient remained in shock, was started on NE infusion via PICC line (chronic LUE - present on admission) and started on sepsis protocol receiving vanc and cefepime   LA 8.1 WBC 26 Cr 1.61 (baseline 0.4) AG 14 (prior <5) alk phos 226 AST 229 ALT 99 Trop 3292 Ddimer >20,000 . CXR with L PICC, small R pleural effusion . CT chest with RLL pneumonia (aspn v HCA); he does not have trach. Started on pressors and s/p 3L fluid by ER at time of CCM eval 12/08/2020 and more responsive. No hx of bleeing this admit. 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   has a past surgical history that includes Rotator cuff repair (Right); Spine surgery; Incision and drainage (Left, 2012); Back surgery; Anterior cervical decomp/discectomy fusion (N/A, 04/05/2020); Anterior cervical decomp/discectomy fusion (N/A, 04/22/2020); IR GASTROSTOMY TUBE MOD SED (07/05/2020); and Cystoscopy (N/A, 11/13/2020).   MICRO     Significant Hospital Events: Including procedures, antibiotic start and stop dates in addition to other pertinent events   6/17 unresponsive at [facility]. On EMS arrival, SBPs 60-- refractory to Ivf and started on epi. Changed to NE in ED. Started on vanc, cefepime. Is a hospice pt but family is contemplating revoking this.  6/17 - seen in [place] and follows some commands. Weak flicker movement on uppers. PRotecting airway. CTA bilarlly. Abd soft. Normal heart sounds  Interim History / Subjective:   12/09/2020 -more awake and he nods and tries to follow commands.  On 15 L nonrebreather mask.  He is on Levophed 26 mcg via the PICC line is on D5 LR.  Cultures with no growth.  Febrile 101.  White count is 40 9.4K on Vanco and cefepime.  Procalcitonin is greater than 150.  Lactic acid is still persistently high without clearance lactate greater than 7.  Hospice nurse visited him at the bedside and explained that end of May 2022 when patient was admitted for his traumatic Foley hematuria and he was discharged.  His antibiotics for sacral osteomyelitis  were held because of his hospice status [facility] is hospice and concomitantly full code].  At this point in time he continues to have his hospice benefits though is full code. Okay here okay so Objective   Blood pressure 109/64, pulse (!) 135, temperature 99.1 F (37.3 C), temperature source Axillary, resp. rate 16, height _0  (1.702 m), weight 88.8 kg, SpO2 100 %.         Intake/Output Summary (Last 24 hours) at 12/09/2020 1114 Last data filed at 12/09/2020 0539 Gross per 24 hour  Intake 3995.79 ml  Output 2060 ml  Net 1935.79 ml   Filed Weights   12/08/20 2013 12/09/20 0351  Weight: 88.7 kg 88.8 kg    Examination: Highly deconditioned male with a facemask oxygen.  Awake and nods with his head.  He is got a flicker of contraction on his fingers.  His abdomen is soft.  His line sites look okay.  Clear to auscultation in the infraclavicular area.  Abdomen soft.  Sacral decub examined it is greater than or 6 inches.  No active drainage.  It goes to the bone.   Labs/imaging that I havepersonally reviewed  (right click and "Reselect all SmartList Selections" daily)   CMP - Cr 1.61 AG 14 Alk phos 226 AST 229 ALT 99 tbili 1.5  Trop I - 3292 Ddimer >20,000  LA 8.1  CBC - WBC 26.1 hgb 9.6 plt 148  INR 1.5  UA- >50 RBCs moderate leukocytes few bacteria     LABS    PULMONARY Recent Labs  Lab 12/09/20 0543  PHART 7.383  PCO2ART 47.1  PO2ART 191*  HCO3 27.4  O2SAT 99.8    CBC Recent Labs  Lab 12/08/20 1351 12/09/20 0324  HGB 9.6* 10.3*  HCT 32.8* 35.1*  WBC 26.2* 49.4*  PLT 148* 156    COAGULATION Recent Labs  Lab 12/08/20 1351 12/09/20 0324  INR 1.5* 1.6*    CARDIAC  No results for input(s): TROPONINI in the last 168 hours. No results for input(s): PROBNP in the last 168 hours.   CHEMISTRY Recent Labs  Lab 12/08/20 1351 12/09/20 0324  NA 139 134*  K 4.1 4.7  CL 100 98  CO2 25 22  GLUCOSE 97 84  BUN 37* 35*  CREATININE 1.61* 1.04  CALCIUM 8.8* 8.6*  MG  --  1.8  PHOS  --  3.0   Estimated Creatinine Clearance: 66.3 mL/min (by C-G formula based on SCr of 1.04 mg/dL).   LIVER Recent Labs  Lab 12/08/20 1351 12/09/20 0324  AST 229* 195*  ALT 99* 91*  ALKPHOS 226* 211*  BILITOT 1.5* 1.2  PROT 7.4 7.9  ALBUMIN 2.2* 2.2*  INR 1.5* 1.6*     INFECTIOUS Recent Labs  Lab 12/08/20 1715 12/08/20 1916  12/08/20 2114 12/09/20 0011  LATICACIDVEN 7.3*  --  9.7* 7.0*  PROCALCITON  --  >150.00  --   --      ENDOCRINE CBG (last 3)  Recent Labs    12/08/20 1356  GLUCAP 80         IMAGING x48h  - image(s) personally visualized  -   highlighted in bold CT Head Wo Contrast  Result Date: 12/08/2020 CLINICAL DATA:  Altered mental status EXAM: CT HEAD WITHOUT CONTRAST TECHNIQUE: Contiguous axial images were obtained from the base of the skull through the vertex without intravenous contrast. COMPARISON:  05/21/2020 FINDINGS: Brain: No evidence of acute infarction, hemorrhage, hydrocephalus, extra-axial collection or mass lesion/mass effect. Cavum septum  pellucidum and cavum vergae incidentally noted. Mild low-density changes within the periventricular and subcortical white matter compatible with chronic microvascular ischemic change. Mild diffuse cerebral volume loss. Vascular: Atherosclerotic calcifications involving the large vessels of the skull base. No unexpected hyperdense vessel. Skull: Normal. Negative for fracture or focal lesion. Sinuses/Orbits: Partial bilateral mastoid opacification. Paranasal sinuses are clear. Other: None. IMPRESSION: 1. No acute intracranial findings. 2. 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 3. Partial bilateral mastoid opacification. Electronically Signed   By: Davina Poke D.O.   On: 12/08/2020 17:18   CT Angio Chest PE W and/or Wo Contrast  Result Date: 12/08/2020 CLINICAL DATA:  Abdominal pain.  Fever. EXAM: CT ANGIOGRAPHY CHEST CT ABDOMEN AND PELVIS WITH CONTRAST TECHNIQUE: Multidetector CT imaging of the chest was performed using the standard protocol during bolus administration of intravenous contrast. Multiplanar CT image reconstructions and MIPs were obtained to evaluate the vascular anatomy. Multidetector CT imaging of the abdomen and pelvis was performed using the standard protocol during bolus administration of intravenous  contrast. CONTRAST:  31m OMNIPAQUE IOHEXOL 350 MG/ML SOLN COMPARISON:  CT stone study 11/12/2020 FINDINGS: CTA CHEST FINDINGS Cardiovascular: Heart size is normal. Aorta is within normal limits. Pulmonary artery opacification is excellent. No focal filling defects are present to suggest pulmonary emboli. Mediastinum/Nodes: No significant mediastinal, hilar, or axillary adenopathy is present. Esophagus is within normal limits. Thoracic inlet is unremarkable. Lungs/Pleura: Asymmetric right lower lobe airspace disease is present. Small effusions are present bilaterally. There is some dependent atelectasis on the left. Mild dependent atelectasis is present in the upper lobes bilaterally. No pneumothorax is present. Musculoskeletal: Vertebral body heights and alignment are maintained. Thoracic kyphosis normal. Review of the MIP images confirms the above findings. CT ABDOMEN and PELVIS FINDINGS Hepatobiliary: Gallstone again noted. No inflammatory changes associated. Liver is unremarkable. Pancreas: Unremarkable. No pancreatic ductal dilatation or surrounding inflammatory changes. Spleen: Normal in size without focal abnormality. Adrenals/Urinary Tract: Adrenal glands are normal bilaterally. Bilateral nonobstructing nephrolithiasis is stable. No mass lesion present. The left ureter remains dilated with some inflammation. Stone is present at the UVJ. Foley catheter is present in the urinary bladder. Stomach/Bowel: Peg tube place. Moderate fluid is present within the stomach. Inflammatory change present. Small bowel unremarkable. Terminal ileum within normal limits. Mobile cecum noted. Ascending and transverse colon are within normal limits. Descending colon unremarkable. Diverticula are present in the descending and sigmoid colon without inflammation. Vascular/Lymphatic: Atherosclerotic calcifications are present in the aorta and branch vessels without aneurysm. No significant adenopathy is present. Reproductive: Prostate  is unremarkable. Other: There is some stranding in the subcutaneous fat. No free fluid or free air is present. No significant ventral hernias are present. Musculoskeletal: Endplate changes of the lumbar spine are most pronounced at L1-2 anteriorly. Disc disease at L4-5 contributes to bilateral foraminal narrowing. Moderate degenerative changes are noted in the hips bilaterally. No focal lytic or blastic lesions are present. Review of the MIP images confirms the above findings. IMPRESSION: 1. No pulmonary embolus. 2. Right lower lobe airspace disease, concerning for pneumonia. 3. Small bilateral pleural effusions and associated atelectasis. 4. Stable bilateral nonobstructing nephrolithiasis. 5. Cholelithiasis without evidence for cholecystitis. 6. 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 7. Degenerative changes of the lumbar spine are most pronounced at L1-2 and L4-5. 8. Aortic Atherosclerosis (ICD10-I70.0). Electronically Signed   By: CSan MorelleM.D.   On: 12/08/2020 17:32   CT Abdomen Pelvis W Contrast  Result Date: 12/08/2020 CLINICAL DATA:  Abdominal pain.  Fever. EXAM:  CT ANGIOGRAPHY CHEST CT ABDOMEN AND PELVIS WITH CONTRAST TECHNIQUE: Multidetector CT imaging of the chest was performed using the standard protocol during bolus administration of intravenous contrast. Multiplanar CT image reconstructions and MIPs were obtained to evaluate the vascular anatomy. Multidetector CT imaging of the abdomen and pelvis was performed using the standard protocol during bolus administration of intravenous contrast. CONTRAST:  38m OMNIPAQUE IOHEXOL 350 MG/ML SOLN COMPARISON:  CT stone study 11/12/2020 FINDINGS: CTA CHEST FINDINGS Cardiovascular: Heart size is normal. Aorta is within normal limits. Pulmonary artery opacification is excellent. No focal filling defects are present to suggest pulmonary emboli. Mediastinum/Nodes: No significant mediastinal, hilar, or axillary adenopathy is  present. Esophagus is within normal limits. Thoracic inlet is unremarkable. Lungs/Pleura: Asymmetric right lower lobe airspace disease is present. Small effusions are present bilaterally. There is some dependent atelectasis on the left. Mild dependent atelectasis is present in the upper lobes bilaterally. No pneumothorax is present. Musculoskeletal: Vertebral body heights and alignment are maintained. Thoracic kyphosis normal. Review of the MIP images confirms the above findings. CT ABDOMEN and PELVIS FINDINGS Hepatobiliary: Gallstone again noted. No inflammatory changes associated. Liver is unremarkable. Pancreas: Unremarkable. No pancreatic ductal dilatation or surrounding inflammatory changes. Spleen: Normal in size without focal abnormality. Adrenals/Urinary Tract: Adrenal glands are normal bilaterally. Bilateral nonobstructing nephrolithiasis is stable. No mass lesion present. The left ureter remains dilated with some inflammation. Stone is present at the UVJ. Foley catheter is present in the urinary bladder. Stomach/Bowel: Peg tube place. Moderate fluid is present within the stomach. Inflammatory change present. Small bowel unremarkable. Terminal ileum within normal limits. Mobile cecum noted. Ascending and transverse colon are within normal limits. Descending colon unremarkable. Diverticula are present in the descending and sigmoid colon without inflammation. Vascular/Lymphatic: Atherosclerotic calcifications are present in the aorta and branch vessels without aneurysm. No significant adenopathy is present. Reproductive: Prostate is unremarkable. Other: There is some stranding in the subcutaneous fat. No free fluid or free air is present. No significant ventral hernias are present. Musculoskeletal: Endplate changes of the lumbar spine are most pronounced at L1-2 anteriorly. Disc disease at L4-5 contributes to bilateral foraminal narrowing. Moderate degenerative changes are noted in the hips bilaterally. No  focal lytic or blastic lesions are present. Review of the MIP images confirms the above findings. IMPRESSION: 1. No pulmonary embolus. 2. Right lower lobe airspace disease, concerning for pneumonia. 3. Small bilateral pleural effusions and associated atelectasis. 4. Stable bilateral nonobstructing nephrolithiasis. 5. Cholelithiasis without evidence for cholecystitis. 6. 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 7. Degenerative changes of the lumbar spine are most pronounced at L1-2 and L4-5. 8. Aortic Atherosclerosis (ICD10-I70.0). Electronically Signed   By: CSan MorelleM.D.   On: 12/08/2020 17:32   DG Chest Port 1 View  Result Date: 12/09/2020 CLINICAL DATA:  75year old male with pneumonia. EXAM: PORTABLE CHEST 1 VIEW COMPARISON:  Portable chest 12/08/2020. Chest CT 07/04/2020 and earlier. FINDINGS: Portable AP semi upright view at 0634 hours. Increased veiling opacity at the right lung base. Continued low lung volumes. Stable cardiac size and mediastinal contours. Stable left PICC line. Mild confluent opacity at the medial left base, retrocardiac. No pneumothorax or pulmonary edema. Paucity of bowel gas in the upper abdomen. No acute osseous abnormality identified. Severe chronic changes at the left shoulder. IMPRESSION: 1. Low lung volumes with increasing right lung base veiling opacity more resembling pleural effusion than pneumonia. 2. Probable atelectasis at the left lung base, but infection there also difficult to exclude. Electronically Signed   By:  Genevie Ann M.D.   On: 12/09/2020 07:23   DG Chest Port 1 View  Result Date: 12/08/2020 CLINICAL DATA:  Possible sepsis EXAM: PORTABLE CHEST 1 VIEW COMPARISON:  07/04/2020 FINDINGS: Cardiac shadow is mildly enlarged but stable. Left-sided PICC line is noted at the cavoatrial junction. The overall inspiratory effort is poor. Stable small pleural effusion on the right is noted. No acute abnormality noted. IMPRESSION: Chronic  changes in the right lung base with small effusion. No focal infiltrate is noted. Electronically Signed   By: Inez Catalina M.D.   On: 12/08/2020 15:09     Resolved Hospital Problem list     Assessment & Plan:   ASSESSMENT / PLAN:   A:  Baseline Quadriplegia without tracheostomy - puts him at risk for acute respiratory failure in setting of sepsi and acute metabolic encephalopathy  3/82/5053 -> he has developed acute hypoxemic respiratory failure with admission present on admission.  15 L facemask.  P:   Monitor closely Pulse ox goal > 88% Check blood gas to determine intubation needed.    A:   Baseline: bed bound Quadriplegia - prior to admission Present acutely on admission: Acute metabolic obtunded encephalopathy due to sepsis  Sick/18/22: Much improved  P:   Monitor closely NPO till dysphagia sorted out    A:   Has chronic PICC (early April for osteo) Circulatory shock due to septic shock needing pressors - present on admission (on midodrine at baseline)  6-18/2022: Persistent circulatory shock requiring Levophed through the left upper extremity PICC line   P:  Levophed for MAP goal > 65   A: Chronic Grad1 Diast Dysfunction  - echo May 2022 Current concern for NSTEMI  - possbly type 2 = @ admission  12/09/2020: Echo done results pending  P: Monitor Cycle enzymes Hold off IV heparin for now (had hematuria massive in May 2022)   A: Sinus tachycardia - at admission. No EKG findings of MI at admit  Sick/18/22: Ongoing heart rate 140.  Autonomic instability and sepsis contributing  P: Monitor tele   A:   History of Sacral Osteomyeltiis - April 2022 - associated with chronic baseline decub  -Antibiotics were withheld end of May 2022 because of discharge to hospice status SEvere sepsis (SIRS + lactic acidosis + Pneumonia on RLL on CT) with septic shock - at admit  -Urine culture 12/08/2020  -Blood culture 12/08/2020  -COVID PCR  sick/17/22-negative  -  6/18 -profound ongoing septic shock with fever and persistent lactic acidosis severe.  Cultures pending but etiology likely due to osteomyelitis and antibiotic stoppage end of May 2022  P:   Vanc 6/17 Cefepime 6/17 -618 Merrem (in lieu of ertapenemm) for prior osteo 6/18 >> Change antibiotics to reflect osteomyelitis previous treatment. Might need ID consult    A:  Has chronic indwelling foley - at risk for recurrnet UTI Has baseline non obstructive nephrolithiasis Baseline creat 0.32m%. Current - creat 1.668m - c/w Acute Renal Failure given > 3X rise - c/w ATN so far - at admit  12/09/2020: AKI resolved.  P:  Fluids and monitor   A:  Severe lactic acidosis - present on admisson with 10% clearance only in ER  6/18 - no clearing  P: Fluids and moitor - reeat bolus Chagne abx Replete as needed    A:   Baseline Chronic PEG at baseline - prior to admit. Severe Protein Calorie Malnutrition at admit Aspiration risk at admit Aysmoptomatic cholelithsiasis - baseline problem at admit on CT  Desc colon diverticulosis - without diverticulitis on CT at admit   Acute Shock liver new problem at admit  12/09/2020 - improvined  P:   TF via PEG NPO Monitor PPI  A:  Chronic anemia at baseline (mid 8s). Ciurrently 9s of chronic disease - present at admit  12/09/2020 - no active bleed   P:  - PRBC for hgb </= 6.9gm%    - exceptions are   -  if ACS susepcted/confirmed then transfuse for hgb </= 8.0gm%,  or    -  active bleeding with hemodynamic instability, then transfuse regardless of hemoglobin value   At at all times try to transfuse 1 unit prbc as possible with exception of active hemorrhage    A Mild thromboyctopenia - acute present on admit. Likeluy due to sepsis (baseline high 100-200s)  P Heparin for SQ proph Monitor   A:   At risk for hypo and hyperglycemia    P:   D5LR at 75cc SSI  MSK/DERM Stagee 4 sacral decub at  baseline  - apppears > 6 inches Chronic pain Opioid dependence with depression - on lyrica, percocet, oxycodoen, cymbalta - baseline prior to admit  Plan Woc consult Monitor for opioid withdrawal  CODE Baseline Hospice/SNF patient - at admit Full Code at admit  Plan  Ongoing goals of care - [facility] will d/w family about revoking code status    Best Practice (right click and "Reselect all SmartList Selections" daily)   Diet/type: NPO Pain/Anxiety/Delirium protocol Not indicated VAP protocol (if indicated): Not indicated DVT prophylaxis: prophylactic heparin  GI prophylaxis: PPI Glucose control:  SSI Central venous access:  Yes, and it is still needed - LUE PICC chronic Arterial line:  N/A Foley:  Yes, and it is still needed - chronic indwelling Mobility:  bed rest  PT consulted: N/A Studies pending: None Culture data pending:urine  and blood Last reviewed culture data:today Antibiotics:cefepime and vanc Antibiotic de-escalation: no,  continue current rx Stop date: to be determined  Daily labs: requested Code Status:  full code Last date of multidisciplinary goals of care discussion [family NA a bedside  - d/w Dr Sherlean Foot [phone] and updated - daughter expressed full code and to call for any changes any time] ccm prognosis: Life-threating Disposition: to the intensive care      ATTESTATION & SIGNATURE   The patient Melquiades Kovar is critically ill with multiple organ systems failure and requires high complexity decision making for assessment and support, frequent evaluation and titration of therapies, application of advanced monitoring technologies and extensive interpretation of multiple databases.   Critical Care Time devoted to patient care services described in this note is  60  Minutes. This time reflects time of care of this signee Dr Brand Males. This critical care time does not reflect procedure time, or teaching time or supervisory time  of PA/NP/Med student/Med Resident etc but could involve care discussion time     Dr. Brand Males, M.D., [facility].C.P Pulmonary and Critical Care Medicine Staff Physician [facility] Pager: [phone], If no answer or between  15:00h - 7:00h: call [phone]  12/09/2020 12:29 PM

## 2020-12-09 NOTE — Plan of Care (Signed)

## 2020-12-10 DIAGNOSIS — R6521 Severe sepsis with septic shock: Secondary | ICD-10-CM | POA: Diagnosis not present

## 2020-12-10 DIAGNOSIS — J9601 Acute respiratory failure with hypoxia: Secondary | ICD-10-CM | POA: Diagnosis not present

## 2020-12-10 DIAGNOSIS — A419 Sepsis, unspecified organism: Secondary | ICD-10-CM | POA: Diagnosis not present

## 2020-12-10 LAB — COMPREHENSIVE METABOLIC PANEL
ALT: 56 U/L — ABNORMAL HIGH (ref 0–44)
AST: 100 U/L — ABNORMAL HIGH (ref 15–41)
Albumin: 1.7 g/dL — ABNORMAL LOW (ref 3.5–5.0)
Alkaline Phosphatase: 159 U/L — ABNORMAL HIGH (ref 38–126)
Anion gap: 6 (ref 5–15)
BUN: 26 mg/dL — ABNORMAL HIGH (ref 8–23)
CO2: 28 mmol/L (ref 22–32)
Calcium: 8.1 mg/dL — ABNORMAL LOW (ref 8.9–10.3)
Chloride: 101 mmol/L (ref 98–111)
Creatinine, Ser: 0.72 mg/dL (ref 0.61–1.24)
GFR, Estimated: >60 mL/min (ref >60)
Glucose, Bld: 166 mg/dL — ABNORMAL HIGH (ref 70–99)
Potassium: 2.9 mmol/L — ABNORMAL LOW (ref 3.5–5.1)
Sodium: 135 mmol/L (ref 135–145)
Total Bilirubin: 0.8 mg/dL (ref 0.3–1.2)
Total Protein: 6.4 g/dL — ABNORMAL LOW (ref 6.5–8.1)

## 2020-12-10 LAB — GLUCOSE, CAPILLARY: Glucose-Capillary: 144 mg/dL — ABNORMAL HIGH (ref 70–99)

## 2020-12-10 LAB — DIC (DISSEMINATED INTRAVASCULAR COAGULATION)PANEL
D-Dimer, Quant: 7.2 ug/mL-FEU — ABNORMAL HIGH (ref 0.00–0.50)
Fibrinogen: 588 mg/dL — ABNORMAL HIGH (ref 210–475)
INR: 1.4 — ABNORMAL HIGH (ref 0.8–1.2)
Platelets: 83 10*3/uL — ABNORMAL LOW (ref 150–400)
Prothrombin Time: 17.2 seconds — ABNORMAL HIGH (ref 11.4–15.2)
aPTT: 47 seconds — ABNORMAL HIGH (ref 24–36)

## 2020-12-10 LAB — LACTIC ACID, PLASMA: Lactic Acid, Venous: 2.1 mmol/L (ref 0.5–1.9)

## 2020-12-10 LAB — CBC
HCT: 28.5 % — ABNORMAL LOW (ref 39.0–52.0)
Hemoglobin: 9 g/dL — ABNORMAL LOW (ref 13.0–17.0)
MCH: 26.9 pg (ref 26.0–34.0)
MCHC: 31.6 g/dL (ref 30.0–36.0)
MCV: 85.1 fL (ref 80.0–100.0)
Platelets: 109 10*3/uL — ABNORMAL LOW (ref 150–400)
RBC: 3.35 MIL/uL — ABNORMAL LOW (ref 4.22–5.81)
RDW: 16.3 % — ABNORMAL HIGH (ref 11.5–15.5)
WBC: 47.2 10*3/uL — ABNORMAL HIGH (ref 4.0–10.5)
nRBC: 0 % (ref 0.0–0.2)

## 2020-12-10 LAB — PROTIME-INR
INR: 1.5 — ABNORMAL HIGH (ref 0.8–1.2)
Prothrombin Time: 17.9 seconds — ABNORMAL HIGH (ref 11.4–15.2)

## 2020-12-10 LAB — MAGNESIUM: Magnesium: 1.4 mg/dL — ABNORMAL LOW (ref 1.7–2.4)

## 2020-12-10 LAB — PHOSPHORUS: Phosphorus: 2.1 mg/dL — ABNORMAL LOW (ref 2.5–4.6)

## 2020-12-10 MED ORDER — OSMOLITE 1.2 CAL PO LIQD
1000.0000 mL | ORAL | Status: DC
  Administered 2020-12-10: 1000 mL

## 2020-12-10 MED ORDER — MAGNESIUM SULFATE 2 GM/50ML IV SOLN
2.0000 g | Freq: Once | INTRAVENOUS | Status: AC
  Administered 2020-12-10: 2 g via INTRAVENOUS
  Filled 2020-12-10: qty 50

## 2020-12-10 MED ORDER — VASOPRESSIN 20 UNITS/100 ML INFUSION FOR SHOCK
0.0000 [IU]/min | INTRAVENOUS | Status: DC
  Administered 2020-12-10 – 2020-12-12 (×5): 0.03 [IU]/min via INTRAVENOUS
  Filled 2020-12-10 (×6): qty 100

## 2020-12-10 MED ORDER — VITAL HIGH PROTEIN PO LIQD: 1000.0000 mL | ORAL | Status: DC

## 2020-12-10 MED ORDER — POTASSIUM PHOSPHATES 15 MMOLE/5ML IV SOLN
15.0000 mmol | Freq: Once | INTRAVENOUS | Status: AC
Start: 2020-12-10 — End: 2020-12-10
  Administered 2020-12-10: 15 mmol via INTRAVENOUS
  Filled 2020-12-10: qty 5

## 2020-12-10 NOTE — Progress Notes (Signed)
Brief Nutrition Note RD working remotely.   Consult received for enteral/tube feeding initiation and management.  Adult Enteral Nutrition Protocol initiated. Full assessment to follow. Patient has G-tube which was placed on 07/05/20. Noted hypokalemia, hypomagnesemia, and hypophosphatemia. Trickle TF ordered.   Admitting Dx: Chronic indwelling Foley catheter [Z97.8] Stage IV pressure ulcer of sacral region [facility]) [L89.154] Quadriplegia and quadriparesis (HCC) [G82.50] Septic shock (HCC) [A41.9, R65.21] Sepsis (HCC) [A41.9] Hematuria, unspecified type [R31.9]  Body mass index is 32.39 kg/m. Pt meets criteria for obesity based on current BMI.  Labs:  Recent Labs  Lab 12/08/20 1351 12/09/20 0324 12/10/20 0335  NA 139 134* 135  K 4.1 4.7 2.9*  CL 100 98 101  CO2 25 22 28   BUN 37* 35* 26*  CREATININE 1.61* 1.04 0.72  CALCIUM 8.8* 8.6* 8.1*  MG  --  1.8 1.4*  PHOS  --  3.0 2.1*  GLUCOSE 97 84 166*       , MS, RD, LDN, CNSC Inpatient Clinical Dietitian RD pager # available in [facility]  After hours/weekend pager # available in [facility]

## 2020-12-10 NOTE — H&P (Signed)
NAME:  Shannon [name], MRN:  [id number], DOB:  02-11-1946, LOS: 2 ADMISSION DATE:  12/08/2020, CONSULTATION DATE:  6/17 REFERRING MD:  Joya Gaskins - EM CHIEF COMPLAINT:  Unresponsive, shock   BRIEF  75 yo PMH quadriplegia, neurogenic bladder, sacral osteomyelitis and stage IV sacral decubitus ulcer , bed bound,  Long  term resident of  [facility], severe protein calorie malnutrition. Hospice at [facility] was initiated early May 2022. Then, with hematuria and hemorrhagic shock  due to traumatic foley at [facility] requiring admission/cysto/blood clot evac/fulguration. Admitted 5/22 - 11/18/20  and discharged back to [facility]. Due to hospice status abx for osteo were stopped (vanc and ertapenem) continued receiving hospice care though as full code. He then presented to ED 12/08/20 after being found unresponsive at care facility. Usual state of health 6/16. Then on 12/08/2020 found by staff unresponsive, and hypotensive SBPs 60s on EMS arrival which did not improve with IVF and was started on an epi infusion by EMS. On arrival to the ED patient remained in shock, was started on NE infusion via PICC line (chronic LUE - present on admission) and started on sepsis protocol receiving vanc and cefepime   LA 8.1 WBC 26 Cr 1.61 (baseline 0.4) AG 14 (prior <5) alk phos 226 AST 229 ALT 99 Trop 3292 Ddimer >20,000 . CXR with L PICC, small R pleural effusion . CT chest with RLL pneumonia (aspn v HCA); he does not have trach. Started on pressors and s/p 3L fluid by ER at time of CCM eval 12/08/2020 and more responsive. No hx of bleeing this admit. Lactate with 10% clearance in 2h since admit  Code status revered to full code at time of admission   Pertinent  Medical History  Sacral osteomyelitis Stage IV sacral wound Chiari I malformation Quadriplegia  Anemia Hemorrhagic shock Neurogenic bladder  COVID-19 infection  Severe protein calorie malnutrition  Renal calculi  Hospice patient due to terminal abnormal weight loss     has a past medical history of Asthma, Chiari I malformation ([place]), Hay fever, Hypertension, and Osteoarthritis of both hands.   has a past surgical history that includes Rotator cuff repair (Right); Spine surgery; Incision and drainage (Left, 2012); Back surgery; Anterior cervical decomp/discectomy fusion (N/A, 04/05/2020); Anterior cervical decomp/discectomy fusion (N/A, 04/22/2020); IR GASTROSTOMY TUBE MOD SED (07/05/2020); and Cystoscopy (N/A, 11/13/2020).   MICRO     Significant Hospital Events: Including procedures, antibiotic start and stop dates in addition to other pertinent events   6/17 unresponsive at [facility]. On EMS arrival, SBPs 60-- refractory to Ivf and started on epi. Changed to NE in ED. Started on vanc, cefepime. Is a hospice pt but family is contemplating revoking this.  6/17 - seen in [place] and follows some commands. Weak flicker movement on uppers. PRotecting airway. CTA bilarlly. Abd soft. Normal heart sounds 6/18 - -more awake and he nods and tries to follow commands.  On 15 L nonrebreather mask.  He is on Levophed 26 mcg via the PICC line is on D5 LR.  Cultures with no growth.  Febrile 101.  White count is 40 9.4K on Vanco and cefepime.  Procalcitonin is greater than 150.  Lactic acid is still persistently high without clearance lactate greater than 7.  Hospice nurse visited him at the bedside and explained that end of May 2022 when patient was admitted for his traumatic Foley hematuria and he was discharged.  His antibiotics for sacral osteomyelitis were held because of his hospice  status [he is hospice and concomitantly full code].  At this point in time he continues to have his hospice benefits though is full code.   Interim History / Subjective:   12/10/2020  -on heated high flow nasal cannula 100% to 20 L flow rate.  Continues to be on Levophed but needs improved.  More awake today antibiotics changed yesterday to reflect previous osteomyelitis  treatment.  Cefepime changed to meropenem.  Fever curve might be improving.  White count reduced to 47,000.  Daughter spoke with hospice services on the phone.  Could not commit to coming in.  Maintain full code.  Unclear if she wants to revoke hospice status.  Abnormal electrolytes being repleted   Objective   Blood pressure 101/70, pulse (!) 115, temperature 99.3 F (37.4 C), temperature source Axillary, resp. rate (!) 21, height 5' 7"  (1.702 m), weight 93.8 kg, SpO2 100 %.    FiO2 (%):  [100 %] 100 %   Intake/Output Summary (Last 24 hours) at 12/10/2020 1050 Last data filed at 12/10/2020 0613 Gross per 24 hour  Intake 4444.18 ml  Output 2575 ml  Net 1869.18 ml   Filed Weights   12/08/20 2013 12/09/20 0351 12/10/20 0339  Weight: 88.7 kg 88.8 kg 93.8 kg    Examination: Highly deconditioned male.  Quadriplegic.  Has flicker movements with his fingers.  He is on heated high flow nasal cannula.  He is more awake today and was able to mouth that he is better.  He also mentioned that he takes food orally.  Abdomen soft.  Normal heart sounds.  Not as tachypneic as yesterday.  Feet in [place] boots.  Labs/imaging that I havepersonally reviewed  (right click and "Reselect all SmartList Selections" daily)   CMP - Cr 1.61 AG 14 Alk phos 226 AST 229 ALT 99 tbili 1.5  Trop I - 3292 Ddimer >20,000  LA 8.1  CBC - WBC 26.1 hgb 9.6 plt 148  INR 1.5  UA- >50 RBCs moderate leukocytes few bacteria      Resolved Hospital Problem list     Assessment & Plan:   ASSESSMENT / PLAN:   A:  Baseline Quadriplegia without tracheostomy - puts him at risk for acute respiratory failure in setting of sepsi and acute metabolic encephalopathy  10/27/3974 -> severe hypoxemic respiratory failure on heated high flow nasal cannula persists but does not clinically meet indication right now for intubation but remains at high risk.  ABG yesterday without acidosis or hypercapnia.  Clinically somewhat better  today  P:   Monitor closely Pulse ox goal > 88% Intubate if needed    A:   Baseline: bed bound Quadriplegia - prior to admission Present acutely on admission: Acute metabolic obtunded encephalopathy due to sepsis  12/10/2020: Progressive improvement in obtunded encephalopathy.  P:   Monitor closely NPO till dysphagia sorted out -> will need swallow eval when he is fully awake    A:   Has chronic PICC (early April for osteo) Circulatory shock due to septic shock needing pressors - present on admission (on midodrine at baseline)  12/10/2020: Persistent circulatory shock requiring Levophed through the left upper extremity PICC line but needs a better   P:  Levophed for MAP goal > 65 Reduce D5 LR to 50 cc/h Add vasopressin 12/10/2020   A: Chronic Grad1 Diast Dysfunction  - echo May 2022 Current concern for NSTEMI  - possbly type 2 = @ admission  12/10/2020: Echo yesterday with no focal wall motion abnormalities  and good pump function.  Troponin downtrending from 12/08/2020  P: Monitor Cycle enzymes as required Hold off IV heparin for now (had hematuria massive in May 2022)   A: Sinus tachycardia - at admission. No EKG findings of MI at admit  12/10/2020: Improving heart rate with improving sepsis after antibiotic change yesterday  P: Monitor tele   A:   History of Sacral Osteomyeltiis - April 2022 - associated with chronic baseline decub  -Antibiotics were withheld end of May 2022 because of discharge to hospice status SEvere sepsis (SIRS + lactic acidosis + Pneumonia on RLL on CT) and osteomyelitis [antibiotics were stopped at discharge end of May 2022 because of hospice status] with septic shock - at admit  -Urine culture 12/08/2020 -multiple species  -Blood culture 12/08/2020 -negative as of 12/10/2020  -COVID PCR and flu PCR 12/08/20-negative  -  6/19 - etiology likely due to osteomyelitis and antibiotic stoppage end of May 2022 and also pneumonia not otherwise  specified.  So far culture negative.  Fever curve and white count improving after antibiotic change yesterday  P:   Vanc 6/17 Cefepime 6/17 -618, Merrem (in lieu of ertapenemm) for prior osteo 6/18 >> Change antibiotics to reflect osteomyelitis previous treatment. Might need ID consult    A:  Has chronic indwelling foley - at risk for recurrnet UTI Has baseline non obstructive nephrolithiasis Baseline creat 0.37m%. Current - creat 1.654m - c/w Acute Renal Failure given > 3X rise - c/w ATN so far - at admit  12/09/2020: AKI resolved.  P:  Fluids and monitor   A:  Severe lactic acidosis - present on admisson with 10% clearance only in ER  12/10/20: Dramatic improvement after antibiotic change 24 hours earlier.  P: Monitor   A:   Baseline Chronic PEG at baseline - prior to admit. Severe Protein Calorie Malnutrition at admit Aspiration risk at admit Aysmoptomatic cholelithsiasis - baseline problem at admit on CT Desc colon diverticulosis - without diverticulitis on CT at admit   Acute Shock liver new problem at admit  12/10/2020 - i improving shock liver parameters  P:   TF via PEG get nutrition consult NPO Monitor PPI  A:  Chronic anemia at baseline (mid 8s). Ciurrently 9s of chronic disease - present at admit  12/10/2020 - no active bleed   P:  - PRBC for hgb </= 6.9gm%    - exceptions are   -  if ACS susepcted/confirmed then transfuse for hgb </= 8.0gm%,  or    -  active bleeding with hemodynamic instability, then transfuse regardless of hemoglobin value   At at all times try to transfuse 1 unit prbc as possible with exception of active hemorrhage    A Mild thromboyctopenia - acute present on admit. Likeluy due to sepsis (baseline high 100-200s)  12/10/2020: Thrombocytopenia is worse.  Given recent admission within 30 days there is concern for HIT  P Stop Heparin for SQ proph Get duplex lower extremity and if positive start direct thrombin  inhibitor Get HIT antibody panel Also check DIC panel Monitor   A:   At risk for hypo and hyperglycemia    P:   D5LR at 75cc -> reduce to 50cc/h SSI  MSK/DERM Stagee 4 sacral decub at baseline  - apppears > 6 inches Chronic pain Opioid dependence with depression - on lyrica, percocet, oxycodoen, cymbalta - baseline prior to admit  Plan Woc consult Monitor for opioid withdrawal  CODE Baseline Hospice/SNF patient - at admit Full Code baseline -  at admit  Plan  Ongoing goals of care - [facility] in  d/w family about revoking code status given ongoing care not c/w hospice    Best Practice (right click and "Reselect all SmartList Selections" daily)   Diet/type: NPO Pain/Anxiety/Delirium protocol Not indicated VAP protocol (if indicated): Not indicated DVT prophylaxis: prophylactic heparin  -> hold 12/10/20 due to low plaetet GI prophylaxis: PPI Glucose control:  SSI Central venous access:  Yes, and it is still needed - LUE PICC chronic Arterial line:  N/A Foley:  Yes, and it is still needed - chronic indwelling Mobility:  bed rest  PT consulted: N/A Studies pending: None Culture data pending:urine  and blood Last reviewed culture data:today Antibiotics:cefepime and vanc Antibiotic de-escalation: no,  continue current rx Stop date: to be determined  Daily labs: requested Code Status:  full code Last date of multidisciplinary goals of care discussion  - [family NA a bedside  - d/w Dr Sherlean Foot [phone] and updated 12/08/2020  - daughter expressed full code and to call for any changes any time]. On 12/10/20  - CCM updated over telephone ccm prognosis: Life-threating Disposition: to the intensive care     [place]   The patient Shannon [name] is critically ill with multiple organ systems failure and requires high complexity decision making for assessment and support, frequent evaluation and titration of therapies, application of advanced  monitoring technologies and extensive interpretation of multiple databases.   Critical Care Time devoted to patient care services described in this note is  35  Minutes. This time reflects time of care of this signee Dr Brand Males. This critical care time does not reflect procedure time, or teaching time or supervisory time of PA/NP/Med student/Med Resident etc but could involve care discussion time     Dr. Brand Males, M.D., [facility].C.P Pulmonary and Critical Care Medicine Staff Physician [facility] Pager: [phone], If no answer or between  15:00h - 7:00h: call [phone]  12/10/2020 11:21 AM    LABS    PULMONARY Recent Labs  Lab 12/09/20 0543 12/09/20 1310  PHART 7.383 7.404  PCO2ART 47.1 43.6  PO2ART 191* 56.8*  HCO3 27.4 26.4  O2SAT 99.8 87.9    CBC Recent Labs  Lab 12/08/20 1351 12/09/20 0324 12/10/20 0335  HGB 9.6* 10.3* 9.0*  HCT 32.8* 35.1* 28.5*  WBC 26.2* 49.4* 47.2*  PLT 148* 156 109*    COAGULATION Recent Labs  Lab 12/08/20 1351 12/09/20 0324 12/10/20 0335  INR 1.5* 1.6* 1.5*    CARDIAC  No results for input(s): TROPONINI in the last 168 hours. No results for input(s): PROBNP in the last 168 hours.   CHEMISTRY Recent Labs  Lab 12/08/20 1351 12/09/20 0324 12/10/20 0335  NA 139 134* 135  K 4.1 4.7 2.9*  CL 100 98 101  CO2 25 22 28   GLUCOSE 97 84 166*  BUN 37* 35* 26*  CREATININE 1.61* 1.04 0.72  CALCIUM 8.8* 8.6* 8.1*  MG  --  1.8 1.4*  PHOS  --  3.0 2.1*   Estimated Creatinine Clearance: 88.5 mL/min (by C-G formula based on SCr of 0.72 mg/dL).   LIVER Recent Labs  Lab 12/08/20 1351 12/09/20 0324 12/10/20 0335  AST 229* 195* 100*  ALT 99* 91* 56*  ALKPHOS 226* 211* 159*  BILITOT 1.5* 1.2 0.8  PROT 7.4 7.9 6.4*  ALBUMIN 2.2* 2.2* 1.7*  INR 1.5* 1.6* 1.5*  INFECTIOUS Recent Labs  Lab 12/08/20 1916 12/08/20 2114 12/09/20 0011 12/09/20 1423  12/10/20 0335  LATICACIDVEN  --    < > 7.0* 2.8* 2.1*  PROCALCITON >150.00  --   --   --   --    < > = values in this interval not displayed.     ENDOCRINE CBG (last 3)  Recent Labs    12/08/20 1356  GLUCAP 80         IMAGING x48h  - image(s) personally visualized  -   highlighted in bold CT Head Wo Contrast  Result Date: 12/08/2020 CLINICAL DATA:  Altered mental status EXAM: CT HEAD WITHOUT CONTRAST TECHNIQUE: Contiguous axial images were obtained from the base of the skull through the vertex without intravenous contrast. COMPARISON:  05/21/2020 FINDINGS: Brain: No evidence of acute infarction, hemorrhage, hydrocephalus, extra-axial collection or mass lesion/mass effect. Cavum septum pellucidum and cavum vergae incidentally noted. Mild low-density changes within the periventricular and subcortical white matter compatible with chronic microvascular ischemic change. Mild diffuse cerebral volume loss. Vascular: Atherosclerotic calcifications involving the large vessels of the skull base. No unexpected hyperdense vessel. Skull: Normal. Negative for fracture or focal lesion. Sinuses/Orbits: Partial bilateral mastoid opacification. Paranasal sinuses are clear. Other: None. IMPRESSION: 1. No acute intracranial findings. 2. 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 3. Partial bilateral mastoid opacification. Electronically Signed   By: Davina Poke D.O.   On: 12/08/2020 17:18   CT Angio Chest PE W and/or Wo Contrast  Result Date: 12/08/2020 CLINICAL DATA:  Abdominal pain.  Fever. EXAM: CT ANGIOGRAPHY CHEST CT ABDOMEN AND PELVIS WITH CONTRAST TECHNIQUE: Multidetector CT imaging of the chest was performed using the standard protocol during bolus administration of intravenous contrast. Multiplanar CT image reconstructions and MIPs were obtained to evaluate the vascular anatomy. Multidetector CT imaging of the abdomen and pelvis was performed using the standard protocol  during bolus administration of intravenous contrast. CONTRAST:  59m OMNIPAQUE IOHEXOL 350 MG/ML SOLN COMPARISON:  CT stone study 11/12/2020 FINDINGS: CTA CHEST FINDINGS Cardiovascular: Heart size is normal. Aorta is within normal limits. Pulmonary artery opacification is excellent. No focal filling defects are present to suggest pulmonary emboli. Mediastinum/Nodes: No significant mediastinal, hilar, or axillary adenopathy is present. Esophagus is within normal limits. Thoracic inlet is unremarkable. Lungs/Pleura: Asymmetric right lower lobe airspace disease is present. Small effusions are present bilaterally. There is some dependent atelectasis on the left. Mild dependent atelectasis is present in the upper lobes bilaterally. No pneumothorax is present. Musculoskeletal: Vertebral body heights and alignment are maintained. Thoracic kyphosis normal. Review of the MIP images confirms the above findings. CT ABDOMEN and PELVIS FINDINGS Hepatobiliary: Gallstone again noted. No inflammatory changes associated. Liver is unremarkable. Pancreas: Unremarkable. No pancreatic ductal dilatation or surrounding inflammatory changes. Spleen: Normal in size without focal abnormality. Adrenals/Urinary Tract: Adrenal glands are normal bilaterally. Bilateral nonobstructing nephrolithiasis is stable. No mass lesion present. The left ureter remains dilated with some inflammation. Stone is present at the UVJ. Foley catheter is present in the urinary bladder. Stomach/Bowel: Peg tube place. Moderate fluid is present within the stomach. Inflammatory change present. Small bowel unremarkable. Terminal ileum within normal limits. Mobile cecum noted. Ascending and transverse colon are within normal limits. Descending colon unremarkable. Diverticula are present in the descending and sigmoid colon without inflammation. Vascular/Lymphatic: Atherosclerotic calcifications are present in the aorta and branch vessels without aneurysm. No significant  adenopathy is present. Reproductive: Prostate is unremarkable. Other: There is some stranding in the subcutaneous fat. No  free fluid or free air is present. No significant ventral hernias are present. Musculoskeletal: Endplate changes of the lumbar spine are most pronounced at L1-2 anteriorly. Disc disease at L4-5 contributes to bilateral foraminal narrowing. Moderate degenerative changes are noted in the hips bilaterally. No focal lytic or blastic lesions are present. Review of the MIP images confirms the above findings. IMPRESSION: 1. No pulmonary embolus. 2. Right lower lobe airspace disease, concerning for pneumonia. 3. Small bilateral pleural effusions and associated atelectasis. 4. Stable bilateral nonobstructing nephrolithiasis. 5. Cholelithiasis without evidence for cholecystitis. 6. 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 7. Degenerative changes of the lumbar spine are most pronounced at L1-2 and L4-5. 8. Aortic Atherosclerosis (ICD10-I70.0). Electronically Signed   By: San Morelle M.D.   On: 12/08/2020 17:32   CT Abdomen Pelvis W Contrast  Result Date: 12/08/2020 CLINICAL DATA:  Abdominal pain.  Fever. EXAM: CT ANGIOGRAPHY CHEST CT ABDOMEN AND PELVIS WITH CONTRAST TECHNIQUE: Multidetector CT imaging of the chest was performed using the standard protocol during bolus administration of intravenous contrast. Multiplanar CT image reconstructions and MIPs were obtained to evaluate the vascular anatomy. Multidetector CT imaging of the abdomen and pelvis was performed using the standard protocol during bolus administration of intravenous contrast. CONTRAST:  79m OMNIPAQUE IOHEXOL 350 MG/ML SOLN COMPARISON:  CT stone study 11/12/2020 FINDINGS: CTA CHEST FINDINGS Cardiovascular: Heart size is normal. Aorta is within normal limits. Pulmonary artery opacification is excellent. No focal filling defects are present to suggest pulmonary emboli. Mediastinum/Nodes: No significant  mediastinal, hilar, or axillary adenopathy is present. Esophagus is within normal limits. Thoracic inlet is unremarkable. Lungs/Pleura: Asymmetric right lower lobe airspace disease is present. Small effusions are present bilaterally. There is some dependent atelectasis on the left. Mild dependent atelectasis is present in the upper lobes bilaterally. No pneumothorax is present. Musculoskeletal: Vertebral body heights and alignment are maintained. Thoracic kyphosis normal. Review of the MIP images confirms the above findings. CT ABDOMEN and PELVIS FINDINGS Hepatobiliary: Gallstone again noted. No inflammatory changes associated. Liver is unremarkable. Pancreas: Unremarkable. No pancreatic ductal dilatation or surrounding inflammatory changes. Spleen: Normal in size without focal abnormality. Adrenals/Urinary Tract: Adrenal glands are normal bilaterally. Bilateral nonobstructing nephrolithiasis is stable. No mass lesion present. The left ureter remains dilated with some inflammation. Stone is present at the UVJ. Foley catheter is present in the urinary bladder. Stomach/Bowel: Peg tube place. Moderate fluid is present within the stomach. Inflammatory change present. Small bowel unremarkable. Terminal ileum within normal limits. Mobile cecum noted. Ascending and transverse colon are within normal limits. Descending colon unremarkable. Diverticula are present in the descending and sigmoid colon without inflammation. Vascular/Lymphatic: Atherosclerotic calcifications are present in the aorta and branch vessels without aneurysm. No significant adenopathy is present. Reproductive: Prostate is unremarkable. Other: There is some stranding in the subcutaneous fat. No free fluid or free air is present. No significant ventral hernias are present. Musculoskeletal: Endplate changes of the lumbar spine are most pronounced at L1-2 anteriorly. Disc disease at L4-5 contributes to bilateral foraminal narrowing. Moderate degenerative  changes are noted in the hips bilaterally. No focal lytic or blastic lesions are present. Review of the MIP images confirms the above findings. IMPRESSION: 1. No pulmonary embolus. 2. Right lower lobe airspace disease, concerning for pneumonia. 3. Small bilateral pleural effusions and associated atelectasis. 4. Stable bilateral nonobstructing nephrolithiasis. 5. Cholelithiasis without evidence for cholecystitis. 6. 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 7. Degenerative changes of the lumbar spine are most pronounced at L1-2 and L4-5. 8. Aortic  Atherosclerosis (ICD10-I70.0). Electronically Signed   By: San Morelle M.D.   On: 12/08/2020 17:32   DG Chest Port 1 View  Result Date: 12/09/2020 CLINICAL DATA:  75 year old male with pneumonia. EXAM: PORTABLE CHEST 1 VIEW COMPARISON:  Portable chest 12/08/2020. Chest CT 07/04/2020 and earlier. FINDINGS: Portable AP semi upright view at 0634 hours. Increased veiling opacity at the right lung base. Continued low lung volumes. Stable cardiac size and mediastinal contours. Stable left PICC line. Mild confluent opacity at the medial left base, retrocardiac. No pneumothorax or pulmonary edema. Paucity of bowel gas in the upper abdomen. No acute osseous abnormality identified. Severe chronic changes at the left shoulder. IMPRESSION: 1. Low lung volumes with increasing right lung base veiling opacity more resembling pleural effusion than pneumonia. 2. Probable atelectasis at the left lung base, but infection there also difficult to exclude. Electronically Signed   By: Genevie Ann M.D.   On: 12/09/2020 07:23   DG Chest Port 1 View  Result Date: 12/08/2020 CLINICAL DATA:  Possible sepsis EXAM: PORTABLE CHEST 1 VIEW COMPARISON:  07/04/2020 FINDINGS: Cardiac shadow is mildly enlarged but stable. Left-sided PICC line is noted at the cavoatrial junction. The overall inspiratory effort is poor. Stable small pleural effusion on the right is noted. No  acute abnormality noted. IMPRESSION: Chronic changes in the right lung base with small effusion. No focal infiltrate is noted. Electronically Signed   By: Inez Catalina M.D.   On: 12/08/2020 15:09   ECHOCARDIOGRAM COMPLETE  Result Date: 12/09/2020    ECHOCARDIOGRAM REPORT   Patient Name:   Shannon [name] Date of Exam: 12/09/2020 Medical Rec #:  [id number]        Height:       67.0 in Accession #:    [id number]       Weight:       195.8 lb Date of Birth:  [birth year]       BSA:          2.004 m Patient Age:    74 years         BP:           109/64 mmHg Patient Gender: M                HR:           130 bpm. Exam Location:  Inpatient Procedure: 2D Echo, Cardiac Doppler, Color Doppler and Intracardiac            Opacification Agent Indications:    Elevated Troponin  History:        Patient has prior history of Echocardiogram examinations, most                 recent 11/13/2020. Quadriplegia, sacral osteomyelitis,                 Circulatory shock due to septic shock needing pressors, Sinus                 tachycardia, chronic anmeia, pneumonia.  Sonographer:    Darlina Sicilian RDCS Referring Phys: [place]  1. Left ventricular ejection fraction, by estimation, is 60 to 65%. The left ventricle has normal function. Left ventricular endocardial border not optimally defined to evaluate regional wall motion. There is mild left ventricular hypertrophy of the basal-septal segment. Left ventricular diastolic parameters are indeterminate.  2. Right ventricular systolic function is normal. The right ventricular size is normal.  3. The mitral valve is normal in  structure. No evidence of mitral valve regurgitation. No evidence of mitral stenosis.  4. The aortic valve is tricuspid. Aortic valve regurgitation is not visualized. No aortic stenosis is present.  5. Aortic dilatation noted. There is mild dilatation of the aortic root, measuring 40 mm. There is mild dilatation of the ascending aorta,  measuring 40 mm.  6. The inferior vena cava is normal in size with greater than 50% respiratory variability, suggesting right atrial pressure of 3 mmHg. FINDINGS  Left Ventricle: Left ventricular ejection fraction, by estimation, is 60 to 65%. The left ventricle has normal function. Left ventricular endocardial border not optimally defined to evaluate regional wall motion. Definity contrast agent was given IV to delineate the left ventricular endocardial borders. The left ventricular internal cavity size was normal in size. There is mild left ventricular hypertrophy of the basal-septal segment. Left ventricular diastolic parameters are indeterminate. Right Ventricle: The right ventricular size is normal. No increase in right ventricular wall thickness. Right ventricular systolic function is normal. Left Atrium: Left atrial size was normal in size. Right Atrium: Right atrial size was not well visualized. Pericardium: There is no evidence of pericardial effusion. Mitral Valve: The mitral valve is normal in structure. No evidence of mitral valve regurgitation. No evidence of mitral valve stenosis. Tricuspid Valve: The tricuspid valve is not well visualized. Tricuspid valve regurgitation is not demonstrated. No evidence of tricuspid stenosis. Aortic Valve: The aortic valve is tricuspid. Aortic valve regurgitation is not visualized. No aortic stenosis is present. Pulmonic Valve: The pulmonic valve was not well visualized. Pulmonic valve regurgitation is not visualized. No evidence of pulmonic stenosis. Aorta: Aortic dilatation noted. There is mild dilatation of the aortic root, measuring 40 mm. There is mild dilatation of the ascending aorta, measuring 40 mm. Pulmonary Artery: Indeterminant PASP, inadequate TR jet. Venous: The inferior vena cava is normal in size with greater than 50% respiratory variability, suggesting right atrial pressure of 3 mmHg. IAS/Shunts: The interatrial septum was not well visualized.  LEFT  VENTRICLE PLAX 2D LVIDd:         5.00 cm LVIDs:         3.50 cm LV PW:         0.70 cm LV IVS:        0.90 cm LVOT diam:     2.50 cm LV SV:         88 LV SV Index:   44 LVOT Area:     4.91 cm  LV Volumes (MOD) LV vol d, MOD A2C: 70.2 ml LV vol d, MOD A4C: 68.9 ml LV vol s, MOD A2C: 35.1 ml LV vol s, MOD A4C: 37.3 ml LV SV MOD A2C:     35.1 ml LV SV MOD A4C:     68.9 ml LV SV MOD BP:      32.9 ml RIGHT VENTRICLE RV S prime:     15.80 cm/s TAPSE (M-mode): 1.6 cm LEFT ATRIUM           Index LA diam:      3.40 cm 1.70 cm/m LA Vol (A2C): 36.5 ml 18.21 ml/m LA Vol (A4C): 21.1 ml 10.53 ml/m  AORTIC VALVE LVOT Vmax:   133.00 cm/s LVOT Vmean:  93.750 cm/s LVOT VTI:    0.179 m  AORTA Ao Root diam: 4.00 cm Ao Asc diam:  4.00 cm  SHUNTS Systemic VTI:  0.18 m Systemic Diam: 2.50 cm Carlyle Dolly MD Electronically signed by Carlyle Dolly MD Signature Date/Time: 12/09/2020/2:35:03 PM  Final

## 2020-12-10 NOTE — Consult Note (Signed)
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 Seen by this Clinical research associate during last admission. No change in this ulcer except for continued contraction and diminished measurement of width, which it should be noted, may be positional. Wound type:Pressure Pressure Injury POA: Yes Measurement:Per Nursing Notes on admission: 8cm x 6cm x 2cm with undermining from 1-4 o'clock measuring 1.4cm and undermining from 4-6 o'clock measuring 2cm. Contraction at periphery is evident from 7-10 o'clock. Wound bed:red, moist, <10% nonviable tissue Drainage (amount, consistency, odor) serous to serosanguinous Periwound: With evidence of contraction, dry. Dressing procedure/placement/frequency: Interventions will be continued from last assessment and are for once daily cleansing with NS, patting dry.  The defect is to be lined with a silver hydrofiber dressing, tucking it into any areas of undermining. The silver dressing is to be topped with fluffed dry gauze to bring it up to skin level and then the dressing is to be topped with silicone foam.  Turning from side to side is in place and patient is on a mattress replacement surface with low air loss feature.Heels are floated on pillows or via the use of Prevalon Boots.  WOC nursing team will not follow, but will remain available to this patient, the nursing and medical teams.  Please re-consult if needed. Thanks, Ladona Mow, MSN, RN, GNP, [place]  Pager# [phone]

## 2020-12-10 NOTE — Progress Notes (Signed)
[facility] ADULT ICU REPLACEMENT PROTOCOL   The patient does apply for the [facility] Adult ICU Electrolyte Replacment Protocol based on the criteria listed below:   1. Is GFR >/= 30 ml/min? Yes.    Patient's GFR today is >60 2. Is SCr </= 2? Yes.   Patient's SCr is 0.72 ml/kg/hr 3. Did SCr increase >/= 0.5 in 24 hours? No. 4. Abnormal electrolyte(s): K+2.9 Phos 2. Mag 1.4 5. Ordered repletion with: protocol 6. If a panic level lab has been reported, has the CCM MD in charge been notified? Yes.  .   Physician:  Nathaniel Man  Lolita Lenz 12/10/2020 5:09 AM

## 2020-12-10 NOTE — Plan of Care (Signed)
  Problem: Education: Goal: Knowledge of General Education information will improve Description: Including pain rating scale, medication(s)/side effects and non-pharmacologic comfort measures Outcome: Progressing   Problem: Health Behavior/Discharge Planning: Goal: Ability to manage health-related needs will improve Outcome: Progressing   Problem: Clinical Measurements: Goal: Ability to maintain clinical measurements within normal limits will improve Outcome: Progressing Goal: Will remain free from infection Outcome: Progressing Goal: Diagnostic test results will improve Outcome: Progressing Goal: Respiratory complications will improve Outcome: Progressing Goal: Cardiovascular complication will be avoided Outcome: Progressing   Problem: Activity: Goal: Risk for activity intolerance will decrease Outcome: Progressing   Problem: Nutrition: Goal: Adequate nutrition will be maintained Outcome: Progressing   Problem: Coping: Goal: Level of anxiety will decrease Outcome: Progressing   Problem: Elimination: Goal: Will not experience complications related to bowel motility Outcome: Progressing Goal: Will not experience complications related to urinary retention Outcome: Progressing   Problem: Elimination: Goal: Will not experience complications related to urinary retention Outcome: Progressing   Problem: Pain Managment: Goal: General experience of comfort will improve Outcome: Progressing   Problem: Safety: Goal: Ability to remain free from injury will improve Outcome: Progressing   Problem: Skin Integrity: Goal: Risk for impaired skin integrity will decrease Outcome: Progressing   Problem: Fluid Volume: Goal: Hemodynamic stability will improve Outcome: Progressing   Problem: Clinical Measurements: Goal: Diagnostic test results will improve Outcome: Progressing Goal: Signs and symptoms of infection will decrease Outcome: Progressing   Problem:  Respiratory: Goal: Ability to maintain adequate ventilation will improve Outcome: Progressing

## 2020-12-10 NOTE — Progress Notes (Signed)
[facility] [address]  [facility]) Comprehensive Outpatient Surge Liaison Note:   Patient is a current hospice patient with [facility]. Admitted to Hospice services on 10/27/20 with a terminal diagnosis of abnormal weight loss. On 12/08/2020, patient was found by staff at [facility] unresponsive, and hypotensive with SBPs 60s. Transported by EMS to [facility] ED per daughter's request. On arrival to the ED patient remained in shock and was started on NE infusion via PICC line and started on sepsis protocol receiving vancomycin and cefepime.   Visited at bedside. No family at bedside.  Report exchanged at bedside with RN Alroy Dust. Pt opened eyes to voice, was oriented to self.  Pt denied pain, SOB, distress.  Resp even/unlabored.  RN reports pt improved color, LOC, work of breathing.  Antibiotic has ben changed to meropenem and vasopressin has been added.  Pt now on 20L HHFNC.  This RN wished pt a happy Father's Day, and pt responded with big smile.  At end of visit pt expressed appreciation of well-wishes, demonstrating improved mentation.     This pt continues to be appropriate for inpatient services due to the need for IV medications for hypotension and infection.    V/S: 98.8axillary, 90/49 (58), HR 104, RR 26, SPO2 100% 20L HFNC   I/O:  4536.07/2573   Labs: K 2.9, BUN 26, Ca 8.1, Phos 2.1, Mg 1.4, Alk Phos 159, AST 100, ALT 56, Albumin 1.7, WBC 47.2, Hgb 9.0, platelets 109k, PT 17.9, INR 1.5, Lactic Acid 2.1 (<<8.1) , D-Dimer 7.2 (<<  >20)   Diagnostics: none new   EXAM:   Problem List: Baseline Quadriplegia without tracheostomy - puts him at risk for acute respiratory failure in setting of sepsis and acute metabolic encephalopathy Monitor closely Pulse ox goal > 88% Intubate if needed     Has chronic PICC (early April for osteo) Circulatory shock due to septic shock needing pressors - present on admission (on midodrine at baseline) Levophed for MAP goal > 65 Reduce D5 LR to 50 cc/h Add vasopressin 12/10/2020     Chronic Grad1 Diast Dysfunction  - echo May 2022 Current concern for NSTEMI  - possbly type 2 = @ admission 12/10/2020: Echo yesterday with no focal wall motion abnormalities and good pump function.  Troponin downtrending from 12/08/2020    Sinus tachycardia - at admission. 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  So far culture negative.  Fever curve and white count improving after antibiotic change yesterday    Severe lactic acidosis - present on admission with 10% clearance only in ER 12/10/20: Dramatic improvement after antibiotic change 24 hours earlier  Baseline Chronic PEG at baseline - prior to admit. Severe Protein  Calorie Malnutrition at admit Aspiration risk at admit Asymptomatic cholelithiasis - baseline problem at admit on CT Desc colon diverticulosis - without diverticulitis on CT at admit   Acute Shock liver new problem at admit 12/10/2020 - improving shock liver parameters  TF via PEG NPO Monitor PPI   Chronic anemia at baseline (mid 8s). Currently 9s of chronic disease - present at admit  12/10/2020 - no active bleed   - PRBC for hgb </= 6.9gm%               - exceptions are                         -  if ACS suspected/confirmed then transfuse for hgb </= 8.0gm%,  or                         -  active bleeding with hemodynamic  instability, then transfuse regardless of hemoglobin value              At all times try to transfuse 1 unit prbc as possible with exception of active hemorrhage   Mild thrombocytopenia - acute present on admit. Likely due to sepsis (baseline high 100-200s) 12/10/2020: Thrombocytopenia is worse.  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   Family Contact: Left HIPAA compliant voicemail on dtg [name]'s phone [phone]).  No return call.   IDT: Hospice team updated   Goals of Care: Full code; daughter continues to desire all measures taken to treat any and all conditions.    Thank you for the opportunity to participate in this patient's care. Domenic Moras, BSN, RN [facility] Liaison (listed on [facility] under Hospice/[facility])    [phone] (24h on call)

## 2020-12-11 ENCOUNTER — Inpatient Hospital Stay (HOSPITAL_COMMUNITY)

## 2020-12-11 DIAGNOSIS — R6521 Severe sepsis with septic shock: Secondary | ICD-10-CM | POA: Diagnosis not present

## 2020-12-11 DIAGNOSIS — A419 Sepsis, unspecified organism: Secondary | ICD-10-CM | POA: Diagnosis not present

## 2020-12-11 DIAGNOSIS — D7582 Heparin induced thrombocytopenia (HIT): Secondary | ICD-10-CM

## 2020-12-11 LAB — BLOOD CULTURE ID PANEL (REFLEXED) - BCID2

## 2020-12-11 LAB — COMPREHENSIVE METABOLIC PANEL
ALT: 48 U/L — ABNORMAL HIGH (ref 0–44)
AST: 75 U/L — ABNORMAL HIGH (ref 15–41)
Albumin: 1.7 g/dL — ABNORMAL LOW (ref 3.5–5.0)
Alkaline Phosphatase: 160 U/L — ABNORMAL HIGH (ref 38–126)
Anion gap: 4 — ABNORMAL LOW (ref 5–15)
BUN: 23 mg/dL (ref 8–23)
CO2: 27 mmol/L (ref 22–32)
Calcium: 7.8 mg/dL — ABNORMAL LOW (ref 8.9–10.3)
Chloride: 101 mmol/L (ref 98–111)
Creatinine, Ser: 0.58 mg/dL — ABNORMAL LOW (ref 0.61–1.24)
GFR, Estimated: >60 mL/min (ref >60)
Glucose, Bld: 165 mg/dL — ABNORMAL HIGH (ref 70–99)
Potassium: 2.9 mmol/L — ABNORMAL LOW (ref 3.5–5.1)
Sodium: 132 mmol/L — ABNORMAL LOW (ref 135–145)
Total Bilirubin: 0.9 mg/dL (ref 0.3–1.2)
Total Protein: 6 g/dL — ABNORMAL LOW (ref 6.5–8.1)

## 2020-12-11 LAB — GLUCOSE, CAPILLARY
Glucose-Capillary: 143 mg/dL — ABNORMAL HIGH (ref 70–99)
Glucose-Capillary: 144 mg/dL — ABNORMAL HIGH (ref 70–99)
Glucose-Capillary: 146 mg/dL — ABNORMAL HIGH (ref 70–99)
Glucose-Capillary: 152 mg/dL — ABNORMAL HIGH (ref 70–99)
Glucose-Capillary: 161 mg/dL — ABNORMAL HIGH (ref 70–99)
Glucose-Capillary: 162 mg/dL — ABNORMAL HIGH (ref 70–99)
Glucose-Capillary: 164 mg/dL — ABNORMAL HIGH (ref 70–99)

## 2020-12-11 LAB — HEPARIN INDUCED PLATELET AB (HIT ANTIBODY): Heparin Induced Plt Ab: 0.125 OD (ref 0.000–0.400)

## 2020-12-11 LAB — CBC
HCT: 25.5 % — ABNORMAL LOW (ref 39.0–52.0)
Hemoglobin: 8 g/dL — ABNORMAL LOW (ref 13.0–17.0)
MCH: 26.6 pg (ref 26.0–34.0)
MCHC: 31.4 g/dL (ref 30.0–36.0)
MCV: 84.7 fL (ref 80.0–100.0)
Platelets: 79 10*3/uL — ABNORMAL LOW (ref 150–400)
RBC: 3.01 MIL/uL — ABNORMAL LOW (ref 4.22–5.81)
RDW: 16.9 % — ABNORMAL HIGH (ref 11.5–15.5)
WBC: 39.7 10*3/uL — ABNORMAL HIGH (ref 4.0–10.5)
nRBC: 0 % (ref 0.0–0.2)

## 2020-12-11 LAB — PHOSPHORUS: Phosphorus: 1.7 mg/dL — ABNORMAL LOW (ref 2.5–4.6)

## 2020-12-11 LAB — MAGNESIUM: Magnesium: 1.5 mg/dL — ABNORMAL LOW (ref 1.7–2.4)

## 2020-12-11 MED ORDER — OSMOLITE 1.5 CAL PO LIQD
1000.0000 mL | ORAL | Status: DC
  Administered 2020-12-11 – 2020-12-12 (×2): 1000 mL
  Filled 2020-12-11 (×3): qty 1000

## 2020-12-11 MED ORDER — PREGABALIN 50 MG PO CAPS
100.0000 mg | ORAL_CAPSULE | Freq: Two times a day (BID) | ORAL | Status: DC
  Administered 2020-12-11 – 2020-12-21 (×21): 100 mg
  Filled 2020-12-11: qty 1
  Filled 2020-12-11 (×4): qty 2
  Filled 2020-12-11: qty 1
  Filled 2020-12-11: qty 2
  Filled 2020-12-11 (×3): qty 1
  Filled 2020-12-11: qty 2
  Filled 2020-12-11 (×5): qty 1
  Filled 2020-12-11 (×3): qty 2
  Filled 2020-12-11: qty 1
  Filled 2020-12-11: qty 2

## 2020-12-11 MED ORDER — POTASSIUM CHLORIDE 20 MEQ PO PACK
40.0000 meq | PACK | Freq: Once | ORAL | Status: AC
  Administered 2020-12-11: 40 meq
  Filled 2020-12-11: qty 2

## 2020-12-11 MED ORDER — PROSOURCE TF PO LIQD
90.0000 mL | Freq: Two times a day (BID) | ORAL | Status: DC
  Administered 2020-12-11 – 2020-12-21 (×21): 90 mL
  Filled 2020-12-11 (×22): qty 90

## 2020-12-11 MED ORDER — MAGNESIUM SULFATE 4 GM/100ML IV SOLN
4.0000 g | Freq: Once | INTRAVENOUS | Status: AC
  Administered 2020-12-11: 4 g via INTRAVENOUS
  Filled 2020-12-11: qty 100

## 2020-12-11 MED ORDER — POTASSIUM PHOSPHATES 15 MMOLE/5ML IV SOLN
30.0000 mmol | Freq: Once | INTRAVENOUS | Status: AC
  Administered 2020-12-11: 30 mmol via INTRAVENOUS
  Filled 2020-12-11: qty 10

## 2020-12-11 MED ORDER — CHLORHEXIDINE GLUCONATE 0.12 % MT SOLN
15.0000 mL | Freq: Two times a day (BID) | OROMUCOSAL | Status: DC
  Administered 2020-12-11 – 2020-12-21 (×19): 15 mL via OROMUCOSAL
  Filled 2020-12-11 (×16): qty 15

## 2020-12-11 MED ORDER — JUVEN PO PACK
1.0000 | PACK | Freq: Two times a day (BID) | ORAL | Status: DC
  Administered 2020-12-11 – 2020-12-15 (×8): 1 via ORAL
  Filled 2020-12-11 (×8): qty 1

## 2020-12-11 MED ORDER — ORAL CARE MOUTH RINSE
15.0000 mL | Freq: Two times a day (BID) | OROMUCOSAL | Status: DC
  Administered 2020-12-11 – 2020-12-20 (×20): 15 mL via OROMUCOSAL

## 2020-12-11 NOTE — Progress Notes (Signed)
Initial Nutrition Assessment  DOCUMENTATION CODES:   Severe malnutrition in context of chronic illness  INTERVENTION:  - will change TF to Osmolite 1.5 @ 20 ml/hr with 90 ml Prosource TF BID and Juven BID. - goal TF regimen: Osmolite 1.5 @ 60 ml/hr with 90 ml Prosource TF BID and Juven BID. - this regimen will provide 2320 kcal, 134 grams protein, and 1097 ml free water. - free water flush per CCM given edema, worsening edema.    NUTRITION DIAGNOSIS:   Severe Malnutrition related to chronic illness (Parkinson's disease) as evidenced by moderate fat depletion, moderate muscle depletion, severe muscle depletion, edema.  GOAL:   Patient will meet greater than or equal to 90% of their needs  MONITOR:   TF tolerance, Labs, Weight trends, Skin  REASON FOR ASSESSMENT:   Consult Enteral/tube feeding initiation and management  ASSESSMENT:   75 year-old male with medical history of quadriplegia, neurogenic bladder, sacral osteomyelitis, stage 4 sacral decubitus ulcer, bed bound, and severe protein calorie malnutrition. He is a long-term resident at [facility]. He presented to the ED on 6/17 after being found unresponsive at [facility].  He has been NPO since admission. He is noted to ne a/o to self only. No family/visitors present.  Patient has a PEG and is receiving Osmolite 1.2 @ 20 ml/hr with 30 ml free water every 4 hours. This regimen is providing 576 kcal, 27 grams protein, and 574 ml free water.  RN denies any issues with TF. Communicated with her plan to continue trickle rate TF d/t persistent hypoK, Mg, Phos.   Weight has been trending up since admission. Edema has progressively worsened since admission.  Per notes: - quadriplegia at baseline - sepsis - acute metabolic encephalopathy - circulatory shock d/t septic shock requiring pressors   Labs reviewed; CBGs: 144, 162, 143, 164 mg/dl, Na: 132 mmol/l, K: 2.9 mmol/l, creatinine: 0.58 mg/dl, Ca: 7.8 mg/dl, Phos: 1.7  mg/dl, Mg: 1.5 mg/dl, Alk Phos and LFTs slightly elevated and trending down from 6/19.  Medications reviewed; 40 mg protonix/day, 40 mEq Klor-Con x1 dose 6/20, 30 mmol IV KPhos x1 run 6/20.  IVF; D5-LR @ 50 ml/hr (204 kcal/24 hrs). Drips; vaso @ 0.03 units/min, levo @ 14 mcg/min.    NUTRITION - FOCUSED PHYSICAL EXAM:  Flowsheet Row Most Recent Value  Orbital Region Moderate depletion  Upper Arm Region Mild depletion  Thoracic and Lumbar Region Unable to assess  Buccal Region Severe depletion  Temple Region Moderate depletion  Clavicle Bone Region Severe depletion  Clavicle and Acromion Bone Region Severe depletion  Scapular Bone Region Unable to assess  Dorsal Hand No depletion  Patellar Region No depletion  Anterior Thigh Region Unable to assess  Posterior Calf Region No depletion  Edema (RD Assessment) Moderate  [all extremities]  Hair Reviewed  Eyes Reviewed  Mouth Reviewed  Skin Reviewed  Nails Reviewed       Diet Order:   Diet Order             Diet NPO time specified  Diet effective now                   EDUCATION NEEDS:   No education needs have been identified at this time  Skin:  Skin Assessment: Skin Integrity Issues: Skin Integrity Issues:: Stage IV Stage IV: sacrum + osteomyelitis  Last BM:  6/19 (type 5 x1)  Height:   Ht Readings from Last 1 Encounters:  12/08/20 _0  (1.702 m)  Weight:   Wt Readings from Last 1 Encounters:  12/11/20 94 kg      Estimated Nutritional Needs:  Kcal:  2300-2500 kcal Protein:  120-135 grams Fluid:  >/= 2 L/day     Jarome Matin, MS, RD, LDN, CNSC Inpatient Clinical Dietitian RD pager # available in [facility]  After hours/weekend pager # available in [facility]

## 2020-12-11 NOTE — Progress Notes (Signed)
K 2.9, Phos 1.7, Mg 1.5 Electrolytes replaced per [facility] electrolyte replacement protocol

## 2020-12-11 NOTE — TOC Initial Note (Signed)
Transition of Care [facility]) - Initial/Assessment Note    Patient Details  Name: Shannon [name] MRN: [id number] Date of Birth: 12/06/1945  Transition of Care [facility]) CM/SW Contact:    Golda Acre, RN Phone Number: 12/11/2020, 9:16 AM  Clinical Narrative:                 Significant Hospital Events: Including procedures, antibiotic start and stop dates in addition to other pertinent events  6/17 unresponsive at [facility]. On EMS arrival, SBPs 60-- refractory to Ivf and started on epi. Changed to NE in ED. Started on vanc, cefepime. Is a hospice pt but family is contemplating revoking this. 6/17 - seen in ER [facility] - mumbles and follows some commands. Weak flicker movement on uppers. PRotecting airway. CTA bilarlly. Abd soft. Normal heart sounds 6/18 - -more awake and he nods and tries to follow commands.  On 15 L nonrebreather mask.  He is on Levophed 26 mcg via the PICC line is on D5 LR.  Cultures with no growth.  Febrile 101.  White count is 40 9.4K on Vanco and cefepime.  Procalcitonin is greater than 150.  Lactic acid is still persistently high without clearance lactate greater than 7.  Hospice nurse visited him at the bedside and explained that end of May 2022 when patient was admitted for his traumatic Foley hematuria and he was discharged.  His antibiotics for sacral osteomyelitis were held because of his hospice status [he is hospice and concomitantly full code].  At this point in time he continues to have his hospice benefits though is full code.   TOC PLAN:  to return to [facility]  Expected Discharge Plan: Skilled Nursing Facility [facility] with hsopice) Barriers to Discharge: Continued Medical Work up   Patient Goals and CMS Choice Patient states their goals for this hospitalization and ongoing recovery are:: unable to state      Expected Discharge Plan and Services Expected Discharge Plan: Skilled Nursing Facility [facility] with hsopice)    Discharge Planning Services: CM Consult                                          Prior Living Arrangements/Services     Patient language and need for interpreter reviewed:: Yes              Criminal Activity/Legal Involvement Pertinent to Current Situation/Hospitalization: No - Comment as needed  Activities of Daily Living Home Assistive Devices/Equipment: Other (Comment) (pt unable to verbalize, he  is a pt at [facility] pt has peg and indwelling foley) ADL Screening (condition at time of admission) Patient's cognitive ability adequate to safely complete daily activities?: No Is the patient deaf or have difficulty hearing?: No Does the patient have difficulty seeing, even when wearing glasses/contacts?: No Does the patient have difficulty concentrating, remembering, or making decisions?: Yes Patient able to express need for assistance with ADLs?: No Does the patient have difficulty dressing or bathing?: Yes Independently performs ADLs?: No Communication: Needs assistance Is this a change from baseline?: Pre-admission baseline Dressing (OT): Needs assistance Is this a change from baseline?: Pre-admission baseline Grooming: Needs assistance Is this a change from baseline?: Pre-admission baseline Feeding: Needs assistance Is this a change from baseline?: Pre-admission baseline Bathing: Needs assistance Is this a change from baseline?: Pre-admission baseline Toileting: Needs assistance Is this a change from baseline?:  Pre-admission baseline In/Out Bed: Needs assistance Is this a change from baseline?: Pre-admission baseline Walks in Home: Needs assistance Is this a change from baseline?: Pre-admission baseline Does the patient have difficulty walking or climbing stairs?: Yes Weakness of Legs: Both Weakness of Arms/Hands: Both  Permission Sought/Granted                  Emotional Assessment Appearance:: Appears stated age Attitude/Demeanor/Rapport:  Unable to Assess Affect (typically observed): Unable to Assess Orientation: : Fluctuating Orientation (Suspected and/or reported Sundowners) Alcohol / Substance Use: Not Applicable Psych Involvement: No (comment)  Admission diagnosis:  Chronic indwelling Foley catheter [Z97.8] Stage IV pressure ulcer of sacral region (HCC) [L89.154] Quadriplegia and quadriparesis (HCC) [G82.50] Septic shock (HCC) [A41.9, R65.21] Sepsis (HCC) [A41.9] Hematuria, unspecified type [R31.9] Patient Active Problem List   Diagnosis Date Noted   Sepsis (HCC) 12/08/2020   Scrotal edema 11/18/2020   Stage IV pressure ulcer of sacral region (HCC) 11/13/2020   Anemia 11/13/2020   Hemorrhagic shock (HCC) 11/13/2020   Gross hematuria 11/12/2020   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   Hematuria 06/19/2020   Sacral decubitus ulcer 06/19/2020   COVID-19 virus infection 06/06/2020 06/19/2020   Palliative care by specialist    Goals of care, counseling/discussion    Protein-calorie malnutrition, severe 05/05/2020   Chronic indwelling Foley catheter 05/03/2020   Quadriplegia and quadriparesis (HCC)    Acute blood loss anemia    Slow transit constipation    Neuropathic pain    Asthma    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   PCP:  [name], MD Pharmacy:  No Pharmacies Listed    Social Determinants of Health (SDOH) Interventions    Readmission Risk Interventions No flowsheet data found.

## 2020-12-11 NOTE — Progress Notes (Addendum)
WL 1234  [facility]) [facility] Liaison Note:   Patient is a current hospice patient with [facility]. Admitted to Hospice services on 10/27/20 with a terminal diagnosis of abnormal weight loss. On 12/08/2020, patient was found by staff at [facility] unresponsive, and hypotensive with SBPs 60s. Transported by EMS to [facility] ED per daughter's request. On arrival to the ED patient remained in shock and was started on NE infusion via PICC line and started on sepsis protocol receiving vancomycin and cefepime.  Per Dr. Gildardo Cranker, [facility] MD, this is a related admission.   Visited at bedside. No family at bedside.  Pt alert when this RN entered room, remembered this RN from yesterday's visit. Pt denied pain, SOB, distress.  Resp even/unlabored.  Pt denies new or unmet needs.  Report exchanged with bedside RN who reports pt sleepier today than yesterday.  Discussed pt with [facility] MD Dr. Gildardo Cranker who approved pt to receive long term abx to treat osteomyelisits as a part of hospice plan of care.   This pt continues to be appropriate for inpatient services due to the need for IV medications for hypotension, infection, and electrolyte replacement.    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 - puts him at risk for acute respiratory failure in setting of sepsis and acute metabolic encephalopathy Remains on heated high flow nasal cannula, mild to moderate cough strength Wean FiO2 as able Push pulmonary hygiene Based on his overall  condition do not believe intubation and mechanical ventilation are in his best interest, they would almost certainly lead to chronic tracheostomy with possible vent, institutionalization      Has chronic PICC (early April for osteo) Circulatory shock due to septic shock needing pressors - present on admission (on midodrine at baseline) and 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 cortisol 30 (6/19), hold off on stress dose steroids for now -Maintenance IV fluids D5 LR 50 cc/h -abx: meropenem + vanco -will plan to repeat blood to assess for clearance. If no evidence additional positives then he can probably keep his existing PICC -WOC for decub ulcer -outpt opioids for wound, cymbalta and lyrica are all currently on hold    Hypophosphatemia Hypokalemia P: -aggressively replete electrolytes and follow      Protein calorie malnutrition, severe, Chronic PEG TF as ordered -? SLP eval soon. He will not be able to meet his caloric needs without TF supplementation    Chronic anemia due to chronic illness Acute on chronic anemia due to critical illness, septic shock P:  -follow CBC -transfusion threshold Hgb 7.0     Mild thromboyctopenia - acute present on admit. Likeluy due to sepsis (baseline high 100-200s) -progressive, elevated d-dimer with  schistocytes, normal fibrinogen. Consistent sepsis or DIC LE doppler US 6/20 > no evidence LE DVT  -heparin sq held 6/19; will continue to hold pending HITT panel results. Would like to get him on prophylaxis as soon as feasible given evidence possible DIC -follow CBC       Discharge Planning: Back to facility when medically optimized   Family Contact: IDT team to contact [name] for support   IDT: Hospice team  updated   Goals of Care: Full code; daughter continues to desire all measures taken to treat any and all conditions.    Thank you for the opportunity to participate in this patient's care.  Domenic Moras, BSN, RN [facility] Liaison (listed on [facility] under Hospice/[facility])    [phone] (24h on call)

## 2020-12-11 NOTE — Progress Notes (Addendum)
NAME:  Shannon [name], MRN:  [id number], DOB:  1946/05/08, LOS: 3 ADMISSION DATE:  12/08/2020, CONSULTATION DATE:  6/17 REFERRING MD:  Joya Gaskins - EM CHIEF COMPLAINT:  Unresponsive, shock   BRIEF  75 yo PMH quadriplegia, neurogenic bladder, sacral osteomyelitis and stage IV sacral decubitus ulcer , bed bound,  Long  term resident of  [facility], severe protein calorie malnutrition. Hospice at [facility] was initiated early May 2022. Then, with hematuria and hemorrhagic shock  due to traumatic foley at [facility] requiring admission/cysto/blood clot evac/fulguration. Admitted 5/22 - 11/18/20  and discharged back to [place]. Due to hospice status abx for osteo were stopped (vanc and ertapenem) continued receiving hospice care though as full code. He then presented to ED 12/08/20 after being found unresponsive at care facility. Usual state of health 6/16. Then on 12/08/2020 found by staff unresponsive, and hypotensive SBPs 60s on EMS arrival which did not improve with IVF and was started on an epi infusion by EMS. On arrival to the ED patient remained in shock, was started on NE infusion via PICC line (chronic LUE - present on admission) and started on sepsis protocol receiving vanc and cefepime   LA 8.1 WBC 26 Cr 1.61 (baseline 0.4) AG 14 (prior <5) alk phos 226 AST 229 ALT 99 Trop 3292 Ddimer >20,000 . CXR with L PICC, small R pleural effusion . CT chest with RLL pneumonia (aspn v HCA); he does not have trach. Started on pressors and s/p 3L fluid by ER at time of CCM eval 12/08/2020 and more responsive. No hx of bleeing this admit. 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   has a past surgical history that includes Rotator cuff repair (Right); Spine surgery; Incision and drainage (Left, 2012); Back surgery; Anterior cervical decomp/discectomy fusion (N/A, 04/05/2020); Anterior cervical decomp/discectomy fusion (N/A, 04/22/2020); IR GASTROSTOMY TUBE MOD SED (07/05/2020); and Cystoscopy (N/A, 11/13/2020).   MICRO     Significant Hospital Events: Including procedures, antibiotic start and stop dates in addition to other pertinent events   6/17 unresponsive at [facility]. On EMS arrival, SBPs 60-- refractory to Ivf and started on epi. Changed to NE in ED. Started on vanc, cefepime. Is a hospice pt but family is contemplating revoking this.  6/17 - seen in [place] and follows some commands. Weak flicker movement on uppers. PRotecting airway. CTA bilarlly. Abd soft. Normal heart sounds 6/18 - -more awake and he nods and tries to follow commands.  On 15 L nonrebreather mask.  He is on Levophed 26 mcg via the PICC line is on D5 LR.  Cultures with no growth.  Febrile 101.  White count is 40 9.4K on Vanco and cefepime.  Procalcitonin is greater than 150.  Lactic acid is still persistently high without clearance lactate greater than 7.  Hospice nurse visited him at the bedside and explained that end of May 2022 when patient was admitted for his traumatic Foley hematuria and he was discharged.  His antibiotics for sacral osteomyelitis were held because of his hospice  status [he is hospice and concomitantly full code].  At this point in time he continues to have his hospice benefits though is full code. Blood cx 6/17 >>  GPC clusters > MRSE (per BCID >   Interim History / Subjective:   Norepinephrine down to 13 Vasopressin 0.03 I/O+ 7 L total Significant hypokalemia and hypophosphatemia, mild hyponatremia Profound leukocytosis but  improving Thrombocytopenia Schistocytes present on smear  Objective   Blood pressure 101/62, pulse 97, temperature 100.2 F (37.9 C), temperature source Axillary, resp. rate (!) 26, height 5' 7"  (1.702 m), weight 94 kg, SpO2 99 %.    FiO2 (%):  [50 %-80 %] 50 %   Intake/Output Summary (Last 24 hours) at 12/11/2020 0355 Last data filed at 12/11/2020 0700 Gross per 24 hour  Intake 4080.65 ml  Output 925 ml  Net 3155.65 ml   Filed Weights   12/09/20 0351 12/10/20 0339 12/11/20 0500  Weight: 88.8 kg 93.8 kg 94 kg    Examination: Gen: Elderly debilitated gentleman, acutely and chronically ill-appearing HEENT: Oropharynx quite dry, no lesions noted Neck: No stridor Lungs: Clear bilaterally, decreased at the bases CV: Regular, distant, no murmur Abd: Nondistended with positive bowel sounds Ext: Foot drop boots in place, bilateral upper and lower extremity edema Skin: No rash Neuro: He does wake to voice and will follow commands.  Attempts to talk but very weak.  He has a weak cough but does sound like he is moving some secretions, not up to his mouth completely.  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 - puts him at risk for acute respiratory failure in setting of sepsi and acute metabolic encephalopathy -Remains on heated high flow nasal cannula, mild to moderate cough strength P:   -Wean FiO2 as able -Push pulmonary hygiene -Based on his overall condition do not believe intubation and mechanical ventilation are in his best interest, they would almost certainly lead to chronic tracheostomy with possible vent, institutionalization   Baseline: bed bound Quadriplegia -  prior to admission Present acutely on admission: Acute metabolic obtunded encephalopathy due to sepsis -Some improvement in mental status with treatment of sepsis remains globally profoundly weak P:   -Follow neurological status as we treat underlying infectious process -Probably could participate in an SLP evaluation to assess for swallowing but I do not believe he would pass at this time 6/20   Circulatory shock due to septic shock needing pressors - present on admission (on midodrine at baseline): source chronic sacral wound w associated osteo, and possible RLL PNA 1 of 2 Blood Cx MRSE, ? 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 Antibiotics were withheld end of May 2022 because of discharge to hospice status -Remains on norepinephrine 13, vasopressin P:  -Work to wean norepinephrine as able -Plan to get him off vasopressin when norepinephrine needs are below 10 -A.m. cortisol 30 (6/19), hold off on stress dose steroids for now -Maintenance IV fluids D5 LR 50 cc/h -abx: meropenem + vanco -will plan to repeat blood to assess for clearance. If no evidence additional positives then he can probably keep his existing PICC -WOC for decub  ulcer -outpt opioids for wound, cymbalta and lyrica are all currently on hold  Chronic Grad1 Diast Dysfunction  - echo May 2022 Current concern for NSTEMI  - possbly type 2 = @ admission -Echo 6/18 with no focal wall motion abnormalities and good pump function.  Troponin downtrending from 12/08/2020 P: -Heparin deferred, likely stress related troponin elevation -Telemetry -Continue to follow    Has chronic indwelling foley - at risk for recurrent UTI Has baseline non obstructive nephrolithiasis P:  -polymicrobial on I/O cx -covered w current abx   Hypophosphatemia Hypokalemia P: -aggressively replete electrolytes and follow   Protein calorie malnutrition, severe Chronic  PEG P: - TF as ordered -? SLP eval soon. He will not be able to meet his caloric needs without TF supplementation   Chronic anemia due to chronic illness Acute on chronic anemia due to critical illness, septic shock P:  -follow CBC -transfusion threshold Hgb 7.0   Mild thromboyctopenia - acute present on admit. Likeluy due to sepsis (baseline high 100-200s) -progressive, elevated d-dimer with schistocytes, normal fibrinogen. Consistent sepsis or DIC LE doppler US 6/20 > no evidence LE DVT P -heparin sq held 6/19; will continue to hold pending HITT panel results. Would like to get him on prophylaxis as soon as feasible given evidence possible DIC -follow CBC    At risk for hypo and hyperglycemia P:   -continue current D5LR 50cc/h -SSI as ordered    Best Practice (right click and "Reselect all SmartList Selections" daily)   Diet/type: NPO Pain/Anxiety/Delirium protocol Not indicated VAP protocol (if indicated): Not indicated DVT prophylaxis: prophylactic heparin  -> hold 12/10/20 due to low plaetet GI prophylaxis: PPI Glucose control:  SSI Central venous access:  Yes, and it is still needed - LUE PICC chronic Arterial line:  N/A Foley:  Yes, and it is still needed - chronic indwelling Mobility:  bed rest  PT consulted: N/A Studies pending: None Culture data pending:urine  and blood Last reviewed culture data:today Antibiotics:cefepime and vanc Antibiotic de-escalation: no,  continue current rx Stop date: to be determined  Daily labs: requested Code Status:  full code Last date of multidisciplinary goals of care discussion:  Dr Lamonte Sakai discussed status, plans and prognosis with [name] by phone 6/20: She is clear that she agrees with current aggressive care, does not want to lose her father, is not interested in speaking with Hospice or in de-escalation.  I explained that we are supporting aggressively to get through this acute decompensation due to septic  shock, but that there is likely no way to resolve the underlying cause, namely his decubitus ulcer and osteo - ultimately it as well as his debilitation from paraplegia will take his life.  As pertains to her goal to get through this acute event, I assured her that were are being aggressive, and that I will commit to continuing all CC interventions that might have benefit.  I also explained that if there are life sustaining interventions from which I do not believe he can recover to his previous QOL, then I would recommend deferring. I will have to talk to her further about possible limitations - for example DNI status, no trach, etc. Will continue to update her and discuss.      ATTESTATION & SIGNATURE   The patient Shannon [name] is critically ill with multiple organ systems failure and requires high complexity decision making for assessment and support, frequent evaluation and titration of therapies, application of advanced monitoring technologies and extensive  interpretation of multiple databases.   Independent CC time: 35 minutes   Baltazar Apo, MD, PhD 12/11/2020, 10:54 AM [facility] [phone] or if no answer before 7:00PM call [phone] For any issues after 7:00PM please call eLink [phone]     LABS    PULMONARY Recent Labs  Lab 12/09/20 0543 12/09/20 1310  PHART 7.383 7.404  PCO2ART 47.1 43.6  PO2ART 191* 56.8*  HCO3 27.4 26.4  O2SAT 99.8 87.9    CBC Recent Labs  Lab 12/09/20 0324 12/10/20 0100 12/10/20 0335 12/11/20 0329  HGB 10.3*  --  9.0* 8.0*  HCT 35.1*  --  28.5* 25.5*  WBC 49.4*  --  47.2* 39.7*  PLT 156 83* 109* 79*    COAGULATION Recent Labs  Lab 12/08/20 1351 12/09/20 0324 12/10/20 0100 12/10/20 0335  INR 1.5* 1.6* 1.4* 1.5*    CARDIAC  No results for input(s): TROPONINI in the last 168 hours. No results for input(s): PROBNP in the last 168 hours.   CHEMISTRY Recent Labs  Lab 12/08/20 1351  12/09/20 0324 12/10/20 0335 12/11/20 0329  NA 139 134* 135 132*  K 4.1 4.7 2.9* 2.9*  CL 100 98 101 101  CO2 25 22 28 27   GLUCOSE 97 84 166* 165*  BUN 37* 35* 26* 23  CREATININE 1.61* 1.04 0.72 0.58*  CALCIUM 8.8* 8.6* 8.1* 7.8*  MG  --  1.8 1.4* 1.5*  PHOS  --  3.0 2.1* 1.7*   Estimated Creatinine Clearance: 88.6 mL/min (A) (by C-G formula based on SCr of 0.58 mg/dL (L)).   LIVER Recent Labs  Lab 12/08/20 1351 12/09/20 0324 12/10/20 0100 12/10/20 0335 12/11/20 0329  AST 229* 195*  --  100* 75*  ALT 99* 91*  --  56* 48*  ALKPHOS 226* 211*  --  159* 160*  BILITOT 1.5* 1.2  --  0.8 0.9  PROT 7.4 7.9  --  6.4* 6.0*  ALBUMIN 2.2* 2.2*  --  1.7* 1.7*  INR 1.5* 1.6* 1.4* 1.5*  --      INFECTIOUS Recent Labs  Lab 12/08/20 1916 12/08/20 2114 12/09/20 0011 12/09/20 1423 12/10/20 0335  LATICACIDVEN  --    < > 7.0* 2.8* 2.1*  PROCALCITON >150.00  --   --   --   --    < > = values in this interval not displayed.     ENDOCRINE CBG (last 3)  Recent Labs    12/11/20 0006 12/11/20 0412 12/11/20 0756  GLUCAP 144* 162* 143*

## 2020-12-11 NOTE — Progress Notes (Signed)
Bilateral lower extremity venous duplex completed. Refer to "CV Proc" under chart review to view preliminary results.  12/11/2020 9:46 AM Eula Fried., MHA, RVT, RDCS, RDMS

## 2020-12-11 NOTE — Progress Notes (Signed)
PHARMACY - PHYSICIAN COMMUNICATION CRITICAL VALUE ALERT - BLOOD CULTURE IDENTIFICATION (BCID)  Shannon [name] is an 75 y.o. male who presented to [facility] on 12/08/2020 with a chief complaint of hematuria  Assessment:  1/4 staph epi, R detected  Name of physician (or Provider) Contacted: [place]  Current antibiotics: merrem, vancomycin  Changes to prescribed antibiotics recommended:  none  Results for orders placed or performed during the hospital encounter of 12/08/20  Blood Culture ID Panel (Reflexed) (Collected: 12/08/2020  1:51 PM)  Result Value Ref Range   Enterococcus faecalis NOT DETECTED NOT DETECTED   Enterococcus Faecium NOT DETECTED NOT DETECTED   Listeria monocytogenes NOT DETECTED NOT DETECTED   Staphylococcus species DETECTED (A) NOT DETECTED   Staphylococcus aureus (BCID) NOT DETECTED NOT DETECTED   Staphylococcus epidermidis DETECTED (A) NOT DETECTED   Staphylococcus lugdunensis NOT DETECTED NOT DETECTED   Streptococcus species NOT DETECTED NOT DETECTED   Streptococcus agalactiae NOT DETECTED NOT DETECTED   Streptococcus pneumoniae NOT DETECTED NOT DETECTED   Streptococcus pyogenes NOT DETECTED NOT DETECTED   A.calcoaceticus-baumannii NOT DETECTED NOT DETECTED   Bacteroides fragilis NOT DETECTED NOT DETECTED   Enterobacterales NOT DETECTED NOT DETECTED   Enterobacter cloacae complex NOT DETECTED NOT DETECTED   Escherichia coli NOT DETECTED NOT DETECTED   Klebsiella aerogenes NOT DETECTED NOT DETECTED   Klebsiella oxytoca NOT DETECTED NOT DETECTED   Klebsiella pneumoniae NOT DETECTED NOT DETECTED   Proteus species NOT DETECTED NOT DETECTED   Salmonella species NOT DETECTED NOT DETECTED   Serratia marcescens NOT DETECTED NOT DETECTED   Haemophilus influenzae NOT DETECTED NOT DETECTED   Neisseria meningitidis NOT DETECTED NOT DETECTED   Pseudomonas aeruginosa NOT DETECTED NOT DETECTED   Stenotrophomonas maltophilia NOT DETECTED NOT DETECTED   Candida  albicans NOT DETECTED NOT DETECTED   Candida auris NOT DETECTED NOT DETECTED   Candida glabrata NOT DETECTED NOT DETECTED   Candida krusei NOT DETECTED NOT DETECTED   Candida parapsilosis NOT DETECTED NOT DETECTED   Candida tropicalis NOT DETECTED NOT DETECTED   Cryptococcus neoformans/gattii NOT DETECTED NOT DETECTED   Methicillin resistance mecA/C DETECTED (A) NOT DETECTED    Arley Phenix RPh 12/11/2020, 3:45 AM

## 2020-12-12 DIAGNOSIS — R6521 Severe sepsis with septic shock: Secondary | ICD-10-CM | POA: Diagnosis not present

## 2020-12-12 DIAGNOSIS — A419 Sepsis, unspecified organism: Secondary | ICD-10-CM | POA: Diagnosis not present

## 2020-12-12 LAB — CBC
HCT: 23.9 % — ABNORMAL LOW (ref 39.0–52.0)
Hemoglobin: 7.4 g/dL — ABNORMAL LOW (ref 13.0–17.0)
MCH: 25.6 pg — ABNORMAL LOW (ref 26.0–34.0)
MCHC: 31 g/dL (ref 30.0–36.0)
MCV: 82.7 fL (ref 80.0–100.0)
Platelets: 75 10*3/uL — ABNORMAL LOW (ref 150–400)
RBC: 2.89 MIL/uL — ABNORMAL LOW (ref 4.22–5.81)
RDW: 16.6 % — ABNORMAL HIGH (ref 11.5–15.5)
WBC: 20.6 10*3/uL — ABNORMAL HIGH (ref 4.0–10.5)
nRBC: 0 % (ref 0.0–0.2)

## 2020-12-12 LAB — COMPREHENSIVE METABOLIC PANEL
ALT: 46 U/L — ABNORMAL HIGH (ref 0–44)
AST: 57 U/L — ABNORMAL HIGH (ref 15–41)
Albumin: 1.6 g/dL — ABNORMAL LOW (ref 3.5–5.0)
Alkaline Phosphatase: 173 U/L — ABNORMAL HIGH (ref 38–126)
Anion gap: 6 (ref 5–15)
BUN: 26 mg/dL — ABNORMAL HIGH (ref 8–23)
CO2: 28 mmol/L (ref 22–32)
Calcium: 8.1 mg/dL — ABNORMAL LOW (ref 8.9–10.3)
Chloride: 97 mmol/L — ABNORMAL LOW (ref 98–111)
Creatinine, Ser: <0.3 mg/dL — ABNORMAL LOW (ref 0.61–1.24)
Glucose, Bld: 168 mg/dL — ABNORMAL HIGH (ref 70–99)
Potassium: 2.8 mmol/L — ABNORMAL LOW (ref 3.5–5.1)
Sodium: 131 mmol/L — ABNORMAL LOW (ref 135–145)
Total Bilirubin: 0.9 mg/dL (ref 0.3–1.2)
Total Protein: 6.2 g/dL — ABNORMAL LOW (ref 6.5–8.1)

## 2020-12-12 LAB — MAGNESIUM: Magnesium: 1.7 mg/dL (ref 1.7–2.4)

## 2020-12-12 LAB — GLUCOSE, CAPILLARY
Glucose-Capillary: 115 mg/dL — ABNORMAL HIGH (ref 70–99)
Glucose-Capillary: 127 mg/dL — ABNORMAL HIGH (ref 70–99)
Glucose-Capillary: 138 mg/dL — ABNORMAL HIGH (ref 70–99)
Glucose-Capillary: 139 mg/dL — ABNORMAL HIGH (ref 70–99)
Glucose-Capillary: 149 mg/dL — ABNORMAL HIGH (ref 70–99)
Glucose-Capillary: 163 mg/dL — ABNORMAL HIGH (ref 70–99)

## 2020-12-12 LAB — CULTURE, BLOOD (ROUTINE X 2): Special Requests: ADEQUATE

## 2020-12-12 LAB — PHOSPHORUS: Phosphorus: 1.8 mg/dL — ABNORMAL LOW (ref 2.5–4.6)

## 2020-12-12 MED ORDER — HEPARIN SODIUM (PORCINE) 5000 UNIT/ML IJ SOLN
5000.0000 [IU] | Freq: Three times a day (TID) | INTRAMUSCULAR | Status: DC
  Administered 2020-12-12 – 2020-12-21 (×27): 5000 [IU] via SUBCUTANEOUS
  Filled 2020-12-12 (×26): qty 1

## 2020-12-12 MED ORDER — POTASSIUM PHOSPHATES 15 MMOLE/5ML IV SOLN
30.0000 mmol | Freq: Once | INTRAVENOUS | Status: AC
  Administered 2020-12-12: 30 mmol via INTRAVENOUS
  Filled 2020-12-12: qty 10

## 2020-12-12 MED ORDER — POTASSIUM CHLORIDE 20 MEQ PO PACK
40.0000 meq | PACK | ORAL | Status: AC
  Administered 2020-12-12 (×2): 40 meq
  Filled 2020-12-12 (×2): qty 2

## 2020-12-12 MED ORDER — ACETAMINOPHEN 160 MG/5ML PO SOLN
650.0000 mg | Freq: Four times a day (QID) | ORAL | Status: DC | PRN
  Administered 2020-12-12 – 2020-12-20 (×6): 650 mg via ORAL
  Filled 2020-12-12 (×6): qty 20.3

## 2020-12-12 NOTE — Progress Notes (Addendum)
WL 1234  [facility]) [facility] Liaison Note:   Patient is a current hospice patient with [facility]. Admitted to Hospice services on 10/27/20 with a terminal diagnosis of abnormal weight loss. On 12/08/2020, patient was found by staff at [facility] unresponsive, and hypotensive with SBPs 60s. Transported by EMS to [facility] ED per daughter's request. On arrival to the ED patient remained in shock and was started on NE infusion via PICC line and started on sepsis protocol receiving vancomycin and cefepime.  Per Dr. Kirt Boys, [facility] MD, this is a related admission.   Visited at bedside. No family present. Report received from nurse. Patient sleeping, did not respond to my voice. Appeared comfortable and in no apparent distress.   This pt continues to be appropriate for inpatient services due to the need for IV medications for hypotension, infection, and electrolyte replacement.   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 Contaminant vs infxn >> Has chronic PICC (early April for osteo) History of Sacral  Osteomyeltiis - April 2022 . -Chronic Grad1 Diast Dysfunction  - echo May 2022 Current concern for NSTEMI  - possbly type 2 = @ admission -Has chronic indwelling foley - at risk for recurrent UTI Has baseline non obstructive nephrolithiasis -Hypophosphatemia Hypokalemia -Protein calorie malnutrition, severe Chronic PEG -Chronic anemia due to chronic illness Acute on chronic anemia due to critical illness, septic shock -Mild thromboyctopenia - acute present on admit. Likeluy due to sepsis (baseline high 100-200s) -At risk for hypo and hyperglycemia  Discharge Planning: Back to facility when medically optimized   Family Contact: Left VM for    IDT: Hospice team updated   Goals of Care: From Dr. Kavin Leech note today: "  Dr Delton Coombes discussed status, plans and prognosis with pt's daughter [name] by phone 6/20: She is clear that she agrees with current aggressive care, does not want to lose her father, is not interested in speaking with Hospice or in de-escalation."   Medication list and transfer summary placed on shadow chart.   Please call [facility] if ambulance transport is needed.    Thank you for the opportunity to participate in this patient's care.   Please do not hesitate to call with questions.   Thank you,   Elsie Saas, RN, [facility]      [facility] Liaison   [phone]

## 2020-12-12 NOTE — Progress Notes (Signed)
NAME:  Shannon [name], MRN:  [id number], DOB:  1946/03/08, LOS: 4 ADMISSION DATE:  12/08/2020, CONSULTATION DATE:  6/17 REFERRING MD:  Joya Gaskins - EM CHIEF COMPLAINT:  Unresponsive, shock   BRIEF  75 yo PMH quadriplegia, neurogenic bladder, sacral osteomyelitis and stage IV sacral decubitus ulcer , bed bound,  Long  term resident of  [facility], severe protein calorie malnutrition. Hospice at [facility] was initiated early May 2022. Then, with hematuria and hemorrhagic shock  due to traumatic foley at [facility] requiring admission/cysto/blood clot evac/fulguration. Admitted 5/22 - 11/18/20  and discharged back to [facility]. Due to hospice status abx for osteo were stopped (vanc and ertapenem) continued receiving hospice care though as full code. He then presented to ED 12/08/20 after being found unresponsive at care facility. Usual state of health 6/16. Then on 12/08/2020 found by staff unresponsive, and hypotensive SBPs 60s on EMS arrival which did not improve with IVF and was started on an epi infusion by EMS. On arrival to the ED patient remained in shock, was started on NE infusion via PICC line (chronic LUE - present on admission) and started on sepsis protocol receiving vanc and cefepime   LA 8.1 WBC 26 Cr 1.61 (baseline 0.4) AG 14 (prior <5) alk phos 226 AST 229 ALT 99 Trop 3292 Ddimer >20,000 . CXR with L PICC, small R pleural effusion . CT chest with RLL pneumonia (aspn v HCA); he does not have trach. Started on pressors and s/p 3L fluid by ER at time of CCM eval 12/08/2020 and more responsive. No hx of bleeing this admit. 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   has a past surgical history that includes Rotator cuff repair (Right); Spine surgery; Incision and drainage (Left, 2012); Back surgery; Anterior cervical decomp/discectomy fusion (N/A, 04/05/2020); Anterior cervical decomp/discectomy fusion (N/A, 04/22/2020); IR GASTROSTOMY TUBE MOD SED (07/05/2020); and Cystoscopy (N/A, 11/13/2020).   MICRO     Significant Hospital Events: Including procedures, antibiotic start and stop dates in addition to other pertinent events   6/17 unresponsive at [facility]. On EMS arrival, SBPs 60-- refractory to Ivf and started on epi. Changed to NE in ED. Started on vanc, cefepime. Is a hospice pt but family is contemplating revoking this.  6/17 - seen in [place] and follows some commands. Weak flicker movement on uppers. PRotecting airway. CTA bilarlly. Abd soft. Normal heart sounds 6/18 - -more awake and he nods and tries to follow commands.  On 15 L nonrebreather mask.  He is on Levophed 26 mcg via the PICC line is on D5 LR.  Cultures with no growth.  Febrile 101.  White count is 40 9.4K on Vanco and cefepime.  Procalcitonin is greater than 150.  Lactic acid is still persistently high without clearance lactate greater than 7.  Hospice nurse visited him at the bedside and explained that end of May 2022 when patient was admitted for his traumatic Foley hematuria and he was discharged.  His antibiotics for sacral osteomyelitis were held because of his hospice  status [he is hospice and concomitantly full code].  At this point in time he continues to have his hospice benefits though is full code. Blood cx 6/17 >>  GPC clusters > MRSE (per BCID >   Interim History / Subjective:   Norepinephrine remains at 13, vasopressin running I/O+ 9 L total Remains hypokalemic, hypophosphatemic, hypomagnesemic Leukocytosis improving, platelets  stable but low   Objective   Blood pressure 124/64, pulse 86, temperature 99.6 F (37.6 C), temperature source Axillary, resp. rate 19, height _0  (1.702 m), weight 94 kg, SpO2 96 %.    FiO2 (%):  [40 %-50 %] 40 %   Intake/Output Summary (Last 24 hours) at 12/12/2020 0762 Last data filed at 12/12/2020 0502 Gross per 24 hour  Intake 3793.99 ml  Output 1775 ml  Net 2018.99 ml   Filed Weights   12/10/20 0339 12/11/20 0500 12/12/20 0500  Weight: 93.8 kg 94 kg 94 kg    Examination: Gen: Elderly man, chronically debilitated, laying in bed in no distress HEENT: Oropharynx a bit less dry Neck: No stridor.  Definitely stronger voice, stronger cough Lungs: Decreased at both bases, otherwise clear CV: Distant, regular, no murmur Abd: Nondistended, positive bowel sounds Ext: Bilateral upper extremity edema, some persistent lower extremity edema, foot drop boots in place Skin: No rash Neuro: Awake easily to voice.  Follows commands, stronger.  Much stronger voice, strong cough.  Able to move his fingers bilaterally on request.  Did not move his toes   Labs/imaging that I have personally reviewed  (right click and "Reselect all SmartList Selections" daily)   Labs and studies reviewed 6/21   Resolved Hospital Problem list   Lactic acidosis  Shock liver  Assessment & Plan:   ASSESSMENT / PLAN:    Baseline Quadriplegia without tracheostomy - puts him at risk for acute respiratory failure in setting of sepsi and acute metabolic encephalopathy -Remains on heated high flow nasal cannula, mild to moderate cough strength P:   -Continue to wean FiO2 as able, push pulmonary hygiene -Poor candidate for mechanical ventilation given his overall prognosis, likelihood that he would be committed to long-term vent if he were to decline and require.  At this point it appears that he is improving, will not need that kind of support.  Baseline: bed bound Quadriplegia - prior to  admission Present acutely on admission: Acute metabolic obtunded encephalopathy due to sepsis -Some improvement in mental status with treatment of sepsis remains globally profoundly weak P:   -Stronger.  Continue to follow neurological status as we treat the underlying infectious process -May be able to participate in an SLP evaluation today 6/21 given his improvement  Circulatory shock due to septic shock needing pressors - present on admission (on midodrine at baseline): source chronic sacral wound w associated osteo, and possible RLL PNA 1 of 2 Blood Cx MRSE, ? 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 Antibiotics were withheld end of May 2022 because of discharge to hospice status -Remains on norepinephrine 13, vasopressin P:  -Aggressively wean norepinephrine.  Will stop the vasopressin once his and he needs are lower -A.m. cortisol on 6/19 argues against stress dose steroids, avoiding for now -Continue maintenance IV fluids D5 LR at 50 cc/h.  Consider decreasing on 6/22 -Continue current meropenem, vancomycin -Repeat blood cultures today to assess for MRSE (question contaminant).  Continue his PICC line for now -WOC for decubitus ulcer -Restarted his Lyrica  6/20, unable to crush Cymbalta.  Avoiding opioids as able to avoid blood pressure affect   -Work to wean norepinephrine as able -Plan to get him off vasopressin when norepinephrine needs are below 10 -A.m. cortisol 30 (6/19), hold off on stress dose steroids for now -Maintenance IV fluids D5 LR 50 cc/h -abx: meropenem + vanco -will plan to repeat blood to assess for clearance. If no evidence additional positives then he can probably keep his existing PICC -WOC for decub ulcer -outpt opioids for wound, cymbalta and lyrica are all currently on hold  Chronic Grad1 Diast Dysfunction  - echo May 2022 Current concern for NSTEMI  - possbly type 2 = @  admission -Echo 6/18 with no focal wall motion abnormalities and good pump function.  Troponin downtrending from 12/08/2020 P: -Stress non-STEMI, deferring heparin -Telemetry  Has chronic indwelling foley - at risk for recurrent UTI Has baseline non obstructive nephrolithiasis P:  -polymicrobial on I/O cx -Adequately covered on current antibiotics   Hypophosphatemia Hypokalemia P: -Repeat replacement 6/21 -Follow BMP   Protein calorie malnutrition, severe Chronic PEG P: -Continue TF as ordered -May be able to get an SLP evaluation today sick/21 since he is stronger.  Chronic anemia due to chronic illness Acute on chronic anemia due to critical illness, septic shock P:  -Follow CBC -transfusion threshold hemoglobin 7.0   Mild thromboyctopenia - acute present on admit. Likeluy due to sepsis (baseline high 100-200s) -progressive, elevated d-dimer with schistocytes, normal fibrinogen. Consistent sepsis or DIC LE doppler US 6/20 > no evidence LE DVT P -Heparin antibody negative -Restart heparin subcutaneous on 6/21 -Follow CBC   At risk for hypo and hyperglycemia P:   -continue current D5LR 50cc/h, consider DC 6/22 -Sliding-scale insulin as ordered    Best Practice (right click and "Reselect all SmartList Selections" daily)   Diet/type: NPO Pain/Anxiety/Delirium protocol Not indicated VAP protocol (if indicated): Not indicated DVT prophylaxis: prophylactic heparin restarted 6/21 GI prophylaxis: PPI Glucose control:  SSI Central venous access:  Yes, and it is still needed - LUE PICC chronic Arterial line:  N/A Foley:  Yes, and it is still needed - chronic indwelling Mobility:  bed rest  PT consulted: N/A Studies pending: None Culture data pending:urine  and blood Last reviewed culture data:today Antibiotics:cefepime and vanc Antibiotic de-escalation: no,  continue current rx Stop date: to be determined  Daily labs: requested Code Status:  full code  Last  date of multidisciplinary goals of care discussion:  Dr Lamonte Sakai discussed status, plans and prognosis with [name] by phone 6/20: She is clear that she agrees with current aggressive care, does not want to lose her father, is not interested in speaking with Hospice or in de-escalation.  I explained that we are supporting aggressively to get through this acute decompensation due to septic shock, but that there is likely no way to resolve the underlying cause, namely his decubitus ulcer and osteo - ultimately it as well as his debilitation from paraplegia will take his life.  As pertains to her goal to get through this acute event, I assured her that were are being aggressive, and that I will commit to continuing all CC interventions that might have benefit.  I also explained that if there are life sustaining interventions from which I do not believe he can recover to his previous QOL, then I would recommend deferring. I will have to talk to her further about possible limitations - for example DNI status, no trach, etc. Will continue  to update her and discuss.     ATTESTATION & SIGNATURE   The patient Wisdom Rickey is critically ill with multiple organ systems failure and requires high complexity decision making for assessment and support, frequent evaluation and titration of therapies, application of advanced monitoring technologies and extensive interpretation of multiple databases.   Independent CC time: 33 minutes   Baltazar Apo, MD, PhD 12/12/2020, 8:07 AM [facility] [phone] or if no answer before 7:00PM call [phone] For any issues after 7:00PM please call eLink [phone]     LABS    PULMONARY Recent Labs  Lab 12/09/20 0543 12/09/20 1310  PHART 7.383 7.404  PCO2ART 47.1 43.6  PO2ART 191* 56.8*  HCO3 27.4 26.4  O2SAT 99.8 87.9    CBC Recent Labs  Lab 12/10/20 0335 12/11/20 0329 12/12/20 0500  HGB 9.0* 8.0* 7.4*  HCT  28.5* 25.5* 23.9*  WBC 47.2* 39.7* 20.6*  PLT 109* 79* 75*    COAGULATION Recent Labs  Lab 12/08/20 1351 12/09/20 0324 12/10/20 0100 12/10/20 0335  INR 1.5* 1.6* 1.4* 1.5*    CARDIAC  No results for input(s): TROPONINI in the last 168 hours. No results for input(s): PROBNP in the last 168 hours.   CHEMISTRY Recent Labs  Lab 12/08/20 1351 12/09/20 0324 12/10/20 0335 12/11/20 0329 12/12/20 0500  NA 139 134* 135 132* 131*  K 4.1 4.7 2.9* 2.9* 2.8*  CL 100 98 101 101 97*  CO2 _0 GLUCOSE 97 84 166* 165* 168*  BUN 37* 35* 26* 23 26*  CREATININE 1.61* 1.04 0.72 0.58* <0.30*  CALCIUM 8.8* 8.6* 8.1* 7.8* 8.1*  MG  --  1.8 1.4* 1.5* 1.7  PHOS  --  3.0 2.1* 1.7* 1.8*   CrCl cannot be calculated (This lab value cannot be used to calculate CrCl because it is not a number: <0.30).   LIVER Recent Labs  Lab 12/08/20 1351 12/09/20 0324 12/10/20 0100 12/10/20 0335 12/11/20 0329 12/12/20 0500  AST 229* 195*  --  100* 75* 57*  ALT 99* 91*  --  56* 48* 46*  ALKPHOS 226* 211*  --  159* 160* 173*  BILITOT 1.5* 1.2  --  0.8 0.9 0.9  PROT 7.4 7.9  --  6.4* 6.0* 6.2*  ALBUMIN 2.2* 2.2*  --  1.7* 1.7* 1.6*  INR 1.5* 1.6* 1.4* 1.5*  --   --      INFECTIOUS Recent Labs  Lab 12/08/20 1916 12/08/20 2114 12/09/20 0011 12/09/20 1423 12/10/20 0335  LATICACIDVEN  --    < > 7.0* 2.8* 2.1*  PROCALCITON >150.00  --   --   --   --    < > = values in this interval not displayed.     ENDOCRINE CBG (last 3)  Recent Labs    12/11/20 2303 12/12/20 0321 12/12/20 0736  GLUCAP 146* 139* 163*

## 2020-12-13 ENCOUNTER — Other Ambulatory Visit: Payer: Self-pay

## 2020-12-13 LAB — COMPREHENSIVE METABOLIC PANEL
ALT: 40 U/L (ref 0–44)
AST: 40 U/L (ref 15–41)
Albumin: 1.8 g/dL — ABNORMAL LOW (ref 3.5–5.0)
Alkaline Phosphatase: 166 U/L — ABNORMAL HIGH (ref 38–126)
Anion gap: 5 (ref 5–15)
BUN: 32 mg/dL — ABNORMAL HIGH (ref 8–23)
CO2: 33 mmol/L — ABNORMAL HIGH (ref 22–32)
Calcium: 8.3 mg/dL — ABNORMAL LOW (ref 8.9–10.3)
Chloride: 104 mmol/L (ref 98–111)
Creatinine, Ser: 0.42 mg/dL — ABNORMAL LOW (ref 0.61–1.24)
GFR, Estimated: >60 mL/min (ref >60)
Glucose, Bld: 110 mg/dL — ABNORMAL HIGH (ref 70–99)
Potassium: 3.7 mmol/L (ref 3.5–5.1)
Sodium: 142 mmol/L (ref 135–145)
Total Bilirubin: 1 mg/dL (ref 0.3–1.2)
Total Protein: 6.7 g/dL (ref 6.5–8.1)

## 2020-12-13 LAB — CBC
HCT: 27.6 % — ABNORMAL LOW (ref 39.0–52.0)
Hemoglobin: 8.7 g/dL — ABNORMAL LOW (ref 13.0–17.0)
MCH: 26 pg (ref 26.0–34.0)
MCHC: 31.5 g/dL (ref 30.0–36.0)
MCV: 82.4 fL (ref 80.0–100.0)
Platelets: 102 10*3/uL — ABNORMAL LOW (ref 150–400)
RBC: 3.35 MIL/uL — ABNORMAL LOW (ref 4.22–5.81)
RDW: 16.6 % — ABNORMAL HIGH (ref 11.5–15.5)
WBC: 18.5 10*3/uL — ABNORMAL HIGH (ref 4.0–10.5)
nRBC: 0 % (ref 0.0–0.2)

## 2020-12-13 LAB — CULTURE, BLOOD (ROUTINE X 2): Culture: NO GROWTH

## 2020-12-13 LAB — GLUCOSE, CAPILLARY
Glucose-Capillary: 104 mg/dL — ABNORMAL HIGH (ref 70–99)
Glucose-Capillary: 107 mg/dL — ABNORMAL HIGH (ref 70–99)
Glucose-Capillary: 158 mg/dL — ABNORMAL HIGH (ref 70–99)
Glucose-Capillary: 147 mg/dL — ABNORMAL HIGH (ref 70–99)
Glucose-Capillary: 123 mg/dL — ABNORMAL HIGH (ref 70–99)
Glucose-Capillary: 128 mg/dL — ABNORMAL HIGH (ref 70–99)

## 2020-12-13 LAB — VANCOMYCIN, PEAK: Vancomycin Pk: 34 ug/mL (ref 30–40)

## 2020-12-13 LAB — PHOSPHORUS: Phosphorus: 2 mg/dL — ABNORMAL LOW (ref 2.5–4.6)

## 2020-12-13 LAB — VANCOMYCIN, TROUGH: Vancomycin Tr: 15 ug/mL (ref 15–20)

## 2020-12-13 LAB — MAGNESIUM: Magnesium: 1.5 mg/dL — ABNORMAL LOW (ref 1.7–2.4)

## 2020-12-13 MED ORDER — OSMOLITE 1.5 CAL PO LIQD
1000.0000 mL | ORAL | Status: DC
  Administered 2020-12-13 – 2020-12-20 (×8): 1000 mL
  Filled 2020-12-13 (×10): qty 1000

## 2020-12-13 MED ORDER — INSULIN ASPART 100 UNIT/ML IJ SOLN
0.0000 [IU] | INTRAMUSCULAR | Status: DC
  Administered 2020-12-13 – 2020-12-21 (×17): 2 [IU] via SUBCUTANEOUS

## 2020-12-13 MED ORDER — VANCOMYCIN HCL 1000 MG/200ML IV SOLN
1000.0000 mg | INTRAVENOUS | Status: AC
  Administered 2020-12-14 – 2020-12-21 (×8): 1000 mg via INTRAVENOUS
  Filled 2020-12-13 (×8): qty 200

## 2020-12-13 MED ORDER — LACTATED RINGERS IV BOLUS
1000.0000 mL | Freq: Once | INTRAVENOUS | Status: AC
  Administered 2020-12-13: 1000 mL via INTRAVENOUS

## 2020-12-13 MED ORDER — POTASSIUM CHLORIDE 20 MEQ PO PACK
40.0000 meq | PACK | Freq: Once | ORAL | Status: AC
  Administered 2020-12-13: 40 meq
  Filled 2020-12-13: qty 2

## 2020-12-13 MED ORDER — FREE WATER
200.0000 mL | Freq: Three times a day (TID) | Status: DC
  Administered 2020-12-13 – 2020-12-21 (×25): 200 mL

## 2020-12-13 MED ORDER — POTASSIUM PHOSPHATES 15 MMOLE/5ML IV SOLN
30.0000 mmol | Freq: Once | INTRAVENOUS | Status: AC
  Administered 2020-12-13: 30 mmol via INTRAVENOUS
  Filled 2020-12-13: qty 10

## 2020-12-13 MED ORDER — MAGNESIUM SULFATE 4 GM/100ML IV SOLN
4.0000 g | Freq: Once | INTRAVENOUS | Status: AC
  Administered 2020-12-13: 4 g via INTRAVENOUS
  Filled 2020-12-13: qty 100

## 2020-12-13 NOTE — TOC Progression Note (Signed)
Transition of Care [facility]) - Progression Note    Patient Details  Name: Shannon [name] MRN: [id number] Date of Birth: 11-Feb-1946  Transition of Care [facility] CM/SW Contact  Golda Acre, RN Phone Number: 12/13/2020, 8:09 AM  Clinical Narrative:    Significant Hospital Events: Including procedures, antibiotic start and stop dates in addition to other pertinent events  6/17 unresponsive at [facility]. On EMS arrival, SBPs 60-- refractory to Ivf and started on epi. Changed to NE in ED. Started on vanc, cefepime. Is a hospice pt but family is contemplating revoking this. 6/17 - seen in ER [facility] - mumbles and follows some commands. Weak flicker movement on uppers. PRotecting airway. CTA bilarlly. Abd soft. Normal heart sounds 6/18 - -more awake and he nods and tries to follow commands.  On 15 L nonrebreather mask.  He is on Levophed 26 mcg via the PICC line is on D5 LR.  Cultures with no growth.  Febrile 101.  White count is 40 9.4K on Vanco and cefepime.  Procalcitonin is greater than 150.  Lactic acid is still persistently high without clearance lactate greater than 7.  Hospice nurse visited him at the bedside and explained that end of May 2022 when patient was admitted for his traumatic Foley hematuria and he was discharged.  His antibiotics for sacral osteomyelitis were held because of his hospice status [he is hospice and concomitantly full code].  At this point in time he continues to have his hospice benefits though is full code. Blood cx 6/17 >>  GPC clusters > MRSE (per BCID >   P:  -Aggressively wean norepinephrine.  Will stop the vasopressin once his and he needs are lower -A.m. cortisol on 6/19 argues against stress dose steroids, avoiding for now -Continue maintenance IV fluids D5 LR at 50 cc/h.  Consider decreasing on 6/22 -Continue current meropenem, vancomycin -Repeat blood cultures today to assess for MRSE (question contaminant).  Continue his PICC line for now -WOC for  decubitus ulcer -Restarted his Lyrica 6/20, unable to crush Cymbalta.  Avoiding opioids as able to avoid blood pressure affect     -Work to wean norepinephrine as able -Plan to get him off vasopressin when norepinephrine needs are below 10 -A.m. cortisol 30 (6/19), hold off on stress dose steroids for now -Maintenance IV fluids D5 LR 50 cc/h -abx: meropenem + vanco -will plan to repeat blood to assess for clearance. If no evidence additional positives then he can probably keep his existing PICC -WOC for decub ulcer -outpt opioids for wound, cymbalta and lyrica are all currently on hold  TOC PLAN:  patient is from [facility] goal is to return to [facility] when stabloe. Interim History / Subjective:    Norepinephrine remains at 13, vasopressin running I/O+ 9 L total Remains hypokalemic, hypophosphatemic, hypomagnesemic Leukocytosis improving, platelets stable but low   Expected Discharge Plan: Skilled Nursing Facility [facility] with hsopice) Barriers to Discharge: Continued Medical Work up  Expected Discharge Plan and Services Expected Discharge Plan: Skilled Nursing Facility ([facility] with hsopice)   Discharge Planning Services: CM Consult                                           Social Determinants of Health (SDOH) Interventions    Readmission Risk Interventions No flowsheet data found.

## 2020-12-13 NOTE — Progress Notes (Signed)
NAME:  Shannon [name], MRN:  [id number], DOB:  [birth year], LOS: 5 ADMISSION DATE:  12/08/2020, CONSULTATION DATE:  6/17 REFERRING MD:  Joya Gaskins - EM CHIEF COMPLAINT:  Unresponsive, shock   BRIEF  75 yo PMH quadriplegia, neurogenic bladder, sacral osteomyelitis and stage IV sacral decubitus ulcer , bed bound,  Long  term resident of  [facility], severe protein calorie malnutrition. Hospice at [facility] was initiated early May 2022. Then, with hematuria and hemorrhagic shock  due to traumatic foley at [facility] requiring admission/cysto/blood clot evac/fulguration. Admitted 5/22 - 11/18/20  and discharged back to [place]. Due to hospice status abx for osteo were stopped (vanc and ertapenem) continued receiving hospice care though as full code. He then presented to ED 12/08/20 after being found unresponsive at care facility. Usual state of health 6/16. Then on 12/08/2020 found by staff unresponsive, and hypotensive SBPs 60s on EMS arrival which did not improve with IVF and was started on an epi infusion by EMS. On arrival to the ED patient remained in shock, was started on NE infusion via PICC line (chronic LUE - present on admission) and started on sepsis protocol receiving vanc and cefepime   LA 8.1 WBC 26 Cr 1.61 (baseline 0.4) AG 14 (prior <5) alk phos 226 AST 229 ALT 99 Trop 3292 Ddimer >20,000 . CXR with L PICC, small R pleural effusion . CT chest with RLL pneumonia (aspn v HCA); he does not have trach. Started on pressors and s/p 3L fluid by ER at time of CCM eval 12/08/2020 and more responsive. No hx of bleeing this admit. 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   has a past surgical history that includes Rotator cuff repair (Right); Spine surgery; Incision and drainage (Left, 2012); Back surgery; Anterior cervical decomp/discectomy fusion (N/A, 04/05/2020); Anterior cervical decomp/discectomy fusion (N/A, 04/22/2020); IR GASTROSTOMY TUBE MOD SED (07/05/2020); and Cystoscopy (N/A, 11/13/2020).   MICRO     Significant Hospital Events: Including procedures, antibiotic start and stop dates in addition to other pertinent events   6/17 unresponsive at [facility]. On EMS arrival, SBPs 60-- refractory to Ivf and started on epi. Changed to NE in ED. Started on vanc, cefepime. Is a hospice pt but family is contemplating revoking this.  6/17 - seen in [place] and follows some commands. Weak flicker movement on uppers. PRotecting airway. CTA bilarlly. Abd soft. Normal heart sounds 6/18 - -more awake and he nods and tries to follow commands.  On 15 L nonrebreather mask.  He is on Levophed 26 mcg via the PICC line is on D5 LR.  Cultures with no growth.  Febrile 101.  White count is 40 9.4K on Vanco and cefepime.  Procalcitonin is greater than 150.  Lactic acid is still persistently high without clearance lactate greater than 7.  Hospice nurse visited him at the bedside and explained that end of May 2022 when patient was admitted for his traumatic Foley hematuria and he was discharged.  His antibiotics for sacral osteomyelitis were held because of his hospice  status [he is hospice and concomitantly full code].  At this point in time he continues to have his hospice benefits though is full code. Blood cx 6/17 >>  GPC clusters > MRSE (per BCID >   Interim History / Subjective:   Progress with weaning norepinephrine, currently at 5 Vasopressin is off I/O +5 0.4 L total Large-scale auto diuresis on 6/21, urine output almost 7  L Platelets low but rebounding    Objective   Blood pressure (!) 113/48, pulse (!) 110, temperature 98.9 F (37.2 C), temperature source Oral, resp. rate (!) 30, height 5' 7"  (1.702 m), weight 90.5 kg, SpO2 97 %.        Intake/Output Summary (Last 24 hours) at 12/13/2020 0757 Last data filed at 12/13/2020 0700 Gross per 24 hour  Intake 3479.85 ml  Output 6975 ml  Net -3495.15 ml   Filed Weights   12/11/20 0500 12/12/20 0500 12/13/20 0400  Weight: 94 kg 94 kg 90.5 kg    Examination: Gen: Elderly man, chronically debilitated, laying in bed in no distress HEENT: Oropharynx is clear.  Strong voice Neck: No stridor Lungs: Creased both bases, otherwise clear CV: Distant, regular, no murmur Abd: Nondistended, positive bowel sounds Ext: Foot drop boots in place, bilateral upper extremity edema Skin: No rash Neuro: Awake, eyes open, follows commands.  Stronger, moderate cough strength.  Flicker movement of his fingers bilaterally on command   Labs/imaging that I have personally reviewed  (right click and "Reselect all SmartList Selections" daily)   Labs and studies reviewed 6/22   Resolved Hospital Problem list   Lactic acidosis  Shock liver  Assessment & Plan:   ASSESSMENT / PLAN:    Baseline Quadriplegia without tracheostomy - puts him at risk for acute respiratory failure in setting of sepsi and acute metabolic encephalopathy -Remains on heated high flow nasal cannula, mild to moderate cough strength P:   -Continue to push pulmonary hygiene -He likely would not survive mechanical ventilation to good outcome, would require long-term vent if he progresses to this point.  Does not look at risk for intubation currently.  Will need to continue to discuss this issue, goals for his care with his daughter going forward.  Baseline: bed bound Quadriplegia - prior to admission Present acutely on admission: Acute metabolic obtunded encephalopathy due to sepsis -Some improvement  in mental status with treatment of sepsis remains globally profoundly weak P:   -Improving with treatment of his underlying sepsis and infection. -SLP evaluation when he can participate, hopefully will allow [place] to start giving his chronic oral medications, some oral nutrition  Septic shock present on admission (on midodrine at baseline): source chronic sacral wound w associated osteo, and possible RLL PNA 1 of 2 Blood Cx MRSE, ? 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 Antibiotics were withheld end of May 2022 because of discharge to hospice status P:  -Norepinephrine needs improved, has been as low as 4, currently 5.  Vasopressin stopped. -Holding off on stress dose steroids given reassuring a.m. cortisol -LR bolus x1 6/22, increase maintenance IV fluids.  Note auto diuresis 6/21 which is likely a good sign -Continue current meropenem, vancomycin -Wound care for his decubitus ulcer -Following his repeat blood cultures.  Depending on this we will decide whether he needs to have his PICC line removed, changed for the 1 of 2 staph epidermidis -His chronic Foley was changed on 6/21 -Continue his Lyrica.  Unable to crush Cymbalta but will restart when able.  Avoiding opioids if possible given the hemodynamic impact   Chronic Grad1 Diast Dysfunction  - echo May 2022 Current concern for NSTEMI  - possbly type 2 = @ admission -Echo 6/18 with no focal wall motion abnormalities and good pump function.  Troponin downtrending from 12/08/2020 P: -Stress non-STEMI, holding off on heparin -Continue telemetry  Has chronic indwelling foley - at risk for recurrent UTI Has baseline non obstructive nephrolithiasis P:  -Polymicrobial from chronic Foley -Foley changed on 6/21 -Adequately covered on his current antibiotics  Hypophosphatemia Hypomagnesemia P: -Needs more replacement on 6/22 -Following BMP  Protein calorie  malnutrition, severe Chronic PEG P: -Continue tube feeding as ordered -SLP evaluation when feasible, hopefully 6/22 since he is stronger  Chronic anemia due to chronic illness Acute on chronic anemia due to critical illness, septic shock P:  -Following CBC -Transfusion threshold Hgb 7.0  Mild thromboyctopenia - acute present on admit. Likely due to sepsis (baseline high 100-200s) -elevated d-dimer with schistocytes, normal fibrinogen. Consistent sepsis or DIC.  Heparin antibody negative LE doppler US 6/20 > no evidence LE DVT P -Restarted subcutaneous heparin prophylaxis on 6/21, tolerating -Follow CBC   At risk for hypo and hyperglycemia P:   -Tube feeds running -Continue current D5 LR, increase to 75 cc/h given his auto diuresis -Order sliding scale insulin if he shows any evidence of hyperglycemia   Best Practice (right click and "Reselect all SmartList Selections" daily)   Diet/type: NPO Pain/Anxiety/Delirium protocol Not indicated VAP protocol (if indicated): Not indicated DVT prophylaxis: prophylactic heparin restarted 6/21 GI prophylaxis: PPI Glucose control:  SSI Central venous access:  Yes, and it is still needed - LUE PICC chronic Arterial line:  N/A Foley:  Yes, and it is still needed - chronic indwelling Mobility:  bed rest  PT consulted: N/A Studies pending: None Culture data pending:urine  and blood Last reviewed culture data:today Antibiotics:cefepime and vanc Antibiotic de-escalation: no,  continue current rx Stop date: to be determined  Daily labs: requested Code Status:  full code  Last date of multidisciplinary goals of care discussion: 6/20 Dr Lamonte Sakai discussed status, plans and prognosis with [name] by phone 6/20: She is clear that she agrees with current aggressive care, does not want to lose her father, is not interested in speaking with Hospice or in de-escalation.  I explained that we are supporting aggressively to get through  this acute decompensation due to septic shock, but that there is likely no way to resolve the underlying cause, namely his decubitus ulcer and osteo - ultimately it as well as his debilitation from paraplegia will take his life.  As pertains to her goal to get through this acute event, I assured her that were are being aggressive, and that I will commit to continuing all CC interventions that might have benefit.  I also explained that if there are life sustaining interventions from which I do not believe he can recover to his previous QOL, then I would recommend deferring. I will have to talk to her further about possible limitations - for example DNI status, no trach, etc. Will continue to update her and discuss.   Family: updated daughter on current status and plans 6/22    [place]   The patient Madyx Delfin is critically ill with multiple organ systems failure and requires high complexity decision making for assessment and support, frequent evaluation and titration of therapies, application of advanced monitoring  technologies and extensive interpretation of multiple databases.   Independent CC time: 32 minutes   Baltazar Apo, MD, PhD 12/13/2020, 7:57 AM [facility] [phone] or if no answer before 7:00PM call [phone] For any issues after 7:00PM please call eLink [phone]     LABS    PULMONARY Recent Labs  Lab 12/09/20 0543 12/09/20 1310  PHART 7.383 7.404  PCO2ART 47.1 43.6  PO2ART 191* 56.8*  HCO3 27.4 26.4  O2SAT 99.8 87.9    CBC Recent Labs  Lab 12/11/20 0329 12/12/20 0500 12/13/20 0357  HGB 8.0* 7.4* 8.7*  HCT 25.5* 23.9* 27.6*  WBC 39.7* 20.6* 18.5*  PLT 79* 75* 102*    COAGULATION Recent Labs  Lab 12/08/20 1351 12/09/20 0324 12/10/20 0100 12/10/20 0335  INR 1.5* 1.6* 1.4* 1.5*    CARDIAC  No results for input(s): TROPONINI in the last 168 hours. No results for input(s): PROBNP in the last 168  hours.   CHEMISTRY Recent Labs  Lab 12/09/20 0324 12/10/20 0335 12/11/20 0329 12/12/20 0500 12/13/20 0357  NA 134* 135 132* 131* 142  K 4.7 2.9* 2.9* 2.8* 3.7  CL 98 101 101 97* 104  CO2 22 28 27 28  33*  GLUCOSE 84 166* 165* 168* 110*  BUN 35* 26* 23 26* 32*  CREATININE 1.04 0.72 0.58* <0.30* 0.42*  CALCIUM 8.6* 8.1* 7.8* 8.1* 8.3*  MG 1.8 1.4* 1.5* 1.7 1.5*  PHOS 3.0 2.1* 1.7* 1.8* 2.0*   Estimated Creatinine Clearance: 87 mL/min (A) (by C-G formula based on SCr of 0.42 mg/dL (L)).   LIVER Recent Labs  Lab 12/08/20 1351 12/09/20 0324 12/10/20 0100 12/10/20 0335 12/11/20 0329 12/12/20 0500 12/13/20 0357  AST 229* 195*  --  100* 75* 57* 40  ALT 99* 91*  --  56* 48* 46* 40  ALKPHOS 226* 211*  --  159* 160* 173* 166*  BILITOT 1.5* 1.2  --  0.8 0.9 0.9 1.0  PROT 7.4 7.9  --  6.4* 6.0* 6.2* 6.7  ALBUMIN 2.2* 2.2*  --  1.7* 1.7* 1.6* 1.8*  INR 1.5* 1.6* 1.4* 1.5*  --   --   --      INFECTIOUS Recent Labs  Lab 12/08/20 1916 12/08/20 2114 12/09/20 0011 12/09/20 1423 12/10/20 0335  LATICACIDVEN  --    < > 7.0* 2.8* 2.1*  PROCALCITON >150.00  --   --   --   --    < > = values in this interval not displayed.     ENDOCRINE CBG (last 3)  Recent Labs    12/12/20 2345 12/13/20 0339 12/13/20 0755  GLUCAP 127* 104* 107*

## 2020-12-13 NOTE — Progress Notes (Signed)
[facility]  [facility]) [facility] Liaison Note:   Patient is a current hospice patient with [facility]. Admitted to Hospice services on 10/27/20 with a terminal diagnosis of abnormal weight loss. On 12/08/2020, patient was found by staff at [facility] unresponsive, and hypotensive with SBPs 60s. Transported by EMS to [facility] ED per daughter's request. On arrival to the ED patient remained in shock and was started on NE infusion via PICC line and started on sepsis protocol receiving vancomycin and cefepime.   Visited at bedside. No family at bedside.  Pt sleeping, did not rouse to name being called.  Resp even/unlabored, now on 4L [place].  Pt autodiuresed almost 7L yesterday, continues to have +3 edema BUE.  Rate of tube feeds increased to 70mL/hr.  SLP pending at this time.  Foley exchanged on 6/21.   Report exchanged with bedside RN who reports pt much the same.    This pt continues to be appropriate for inpatient services due to the need for IV medications for hypotension, infection, and electrolyte replacement.    V/S: 98.2 oral, 116/50, HR 70, RR 15, SPO2 100% 4L [place]   I/O:  3479.02/6974   Labs: CO2 33, BUN 32, Creat 0.42,  Ca 8.2, Phos 2.0, Mg 1.5, Albumin 1.8,  WBC 18.5, Hgb 8.7, platelets 102k Diagnostics: none new   Problem List: Baseline Quadriplegia without tracheostomy - puts him at risk for acute respiratory failure in setting of sepsis and acute metabolic encephalopathy  Push pulmonary hygiene Based on his overall condition do not believe intubation and mechanical ventilation are in his best interest, they would almost certainly lead to chronic tracheostomy with possible vent, institutionalization      Has chronic PICC (early April for osteo) Circulatory shock due to septic shock needing pressors - present on admission (on midodrine at baseline) and History of Sacral Osteomyelitis - April 2022 - associated with chronic baseline decub             - Norepinephrine needs improved, has  been as low as 4, currently 5.  Vasopressin stopped. -Holding off on stress dose steroids given reassuring a.m. cortisol -LR bolus x1 6/22, increase maintenance IV fluids.  Note auto diuresis 6/21 which is likely a good sign -Continue current meropenem, vancomycin -Wound care for his decubitus ulcer -Following his repeat blood cultures.  Depending on this we will decide whether he needs to have his PICC line removed, changed for the 1 of 2 staph epidermidis -His chronic Foley was changed on 6/21 -Continue his Lyrica.  Unable to crush Cymbalta but will restart when able.  Avoiding opioids if possible given the hemodynamic impact    Protein calorie malnutrition, severe, Chronic PEG Continue tube feeding as ordered -SLP evaluation when feasible, hopefully 6/22 since he is stronger      Discharge Planning: Back to facility when medically optimized   Family Contact: IDT team to contact pt's daughter [name] for support   IDT: Hospice team updated   Goals of Care: Full code; daughter continues to desire all measures taken to treat any and all conditions.     At time of discharge please use [facility] for transport.   Thank you for the opportunity to participate in this patient's care.  Gillian Scarce, BSN, RN [facility] Liaison (listed on [place] under Hospice/[facility])    [phone] (24h on call)

## 2020-12-13 NOTE — Evaluation (Signed)
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 Sherwood Gambler, 1992-->surg at [facility].  Summer 2021->Cervicalgia,arm pain, hand atrophy RUE wkness, hyperreflex (Dr. Rosalita Levan MRI: extensive cord atrophy and spinal and foraminal stenosis->to get surgery 03/2020   Hay fever    Hypertension    Osteoarthritis of both hands    Past Surgical History:  Past Surgical History:  Procedure Laterality Date   ANTERIOR CERVICAL DECOMP/DISCECTOMY FUSION N/A 04/05/2020   Procedure: ANTERIOR CERVICAL DECOMPRESSION/DISCECTOMY FUSION, INTERBODY PROSTHESIS, PLATE/SCREWS CERVICAL THREE-CERVICAL FOUR, CERVICAL FOUR- CERVICAL FIVE;  Surgeon: Newman Pies, MD;  Location: [place];  Service: Neurosurgery;  Laterality: N/A;   ANTERIOR CERVICAL DECOMP/DISCECTOMY FUSION N/A 04/22/2020   Procedure: Reexploration of anterior cervical wound for epidural hematoma;  Surgeon: Kary Kos, MD;  Location: [place];  Service: Neurosurgery;  Laterality: N/A;   BACK SURGERY     CYSTOSCOPY N/A 11/13/2020   Procedure: CYSTOSCOPY, CLOT EVACUATION, FULGURATION;  Surgeon: Robley Fries, MD;  Location: [facility];  Service: Urology;  Laterality: N/A;   INCISION AND DRAINAGE Left 2012   L hand infection   IR GASTROSTOMY TUBE MOD SED  07/05/2020   ROTATOR CUFF REPAIR Right    x 3   SPINE SURGERY     HPI:  75 yo male adm to [facility] with AMS - septic, hypotensive.  Prior to admit, pt is being followed by hospice and remains a full code.  Pt PMH + for Hemorrhagic shock due to massive hematuria/ chronic urinary retention with admit  10/2020 Chiari Malformation,  s/p ACDF 04/05/2020 with revision 04/22/2020, functional quadriparesis, pleural effusion.  Pt with dysphagia diagnosed post=ACDF via several MBS studies showing silent aspiration - 11/2 and 11/12.  Most recent exam 06/05/2020 showed improvement with swallowing with recommendation to advance to regular/thin.  He received a a PEG tube 06/2020 due to malnutrition.  Pt reports he eats "whatever he wants" at his SNF.  He does endorse increased mucus/coughing not always associated with po intake. At this time, pt is receiving tube feeding for nutrition and otherwise is NPO.   Assessment / Plan / Recommendation Clinical Impression  Pt presnts with clinical presentation of worsening deconditioning during this hospital admit compared to 10/2020.  Pt with very edematous bilaterally arms.  His neck is slightly elevated and he did not consistently hold his hed neutral - causing concern for increased risk for airway infiltration.  Pt CN exam largely unremarkable but his cough/voice are weak.  Provided him with oral care and po trials.  Continued multiple very audible subswallows (x7 with tsp nectar)- causing SLP to question UES involvement.  Clinicallly inconsistent adequate laryngeal closure appeared present.  Weak nonprotective cough noted immediately post swallow of water via straw.  Very controlled small single straw boluses of water did prevent cough.  Given pt's current medical status with potential/probable worsening swallowing function - recommend pt be allowed floor stock items only with RN and NT. Do not recommend po medications as pt has his PEG and he believes he takes all of his medications via PEG at the rehab.    SLP will follow  up for readiness for dietary advancement and/or repeat instrumental evaluation - especially in light of pt's current code status and multiple comorbidities.  Swallow ability may be variable based on mentation. Educated pt using teach back and written  precaution sign to swalow precautions. SLP Visit Diagnosis: Dysphagia, unspecified (R13.10)    Aspiration Risk  Moderate aspiration risk    Diet Recommendation  (floor stock)   Medication Administration: Via alternative means (IV, suspension of via PEG) Compensations: Slow rate;Small sips/bites Postural Changes: Remain upright for at least 30 minutes after po intake    Other  Recommendations Oral Care Recommendations: Oral care BID   Follow up Recommendations    TBD    Frequency and Duration min 2x/week  1 week       Prognosis   Good for goals.     Swallow Study   General HPI: 75 yo male adm to [facility] with AMS - septic, hypotensive.  Prior to admit, pt is being followed by hospice and remains a full code.  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  Pt with dysphagia diagnosed post=ACDF via several MBS studies showing silent aspiration - 11/2 and 11/12.  Most recent exam 06/05/2020 showed improvement with swallowing with recommendation to advance to regular/thin.  He received a a PEG tube 06/2020 due to malnutrition.  Pt reports he eats "whatever he wants" at his SNF.  He does endorse increased mucus/coughing not always associated with po intake. At this time, pt is receiving tube feeding for nutrition and otherwise is NPO. Type of Study: Bedside Swallow Evaluation Previous Swallow Assessment: see hpi Diet Prior to this Study: NPO Respiratory Status: Nasal cannula History of Recent Intubation: No Behavior/Cognition: Alert;Cooperative;Pleasant mood Oral Cavity Assessment: Within Functional Limits Oral Care Completed by SLP: Yes (with suction kit) Oral Cavity - Dentition: Adequate natural dentition;Other (Comment) (few teeth missing) Patient Positioning: Upright in bed;Other (comment) (reverse trendelenburg for better  position) Baseline Vocal Quality: Other (comment) (weak) Volitional Cough: Weak (weak, nonproductive) Volitional Swallow: Unable to elicit    Oral/Motor/Sensory Function Overall Oral Motor/Sensory Function: Other (comment) (? minimal facial asymmetry on right)   Ice Chips Ice chips: Within functional limits Presentation: Spoon Pharyngeal Phase Impairments: Multiple swallows   Thin Liquid Thin Liquid: Impaired Presentation: Cup;Spoon;Straw Pharyngeal  Phase Impairments: Multiple swallows;Throat Clearing - Delayed;Cough - Immediate;Decreased hyoid-laryngeal movement    Nectar Thick Nectar Thick Liquid: Within functional limits Presentation: Straw;Cup;Spoon   Honey Thick Honey Thick Liquid: Not tested   Puree Puree: Within functional limits Presentation: Spoon Pharyngeal Phase Impairments: Multiple swallows   Solid     Solid: Within functional limits (graham cracker) Oral Phase Functional Implications: Other (comment) (effective mastication without oral retention)      Macario Golds 12/13/2020,4:17 PM  Kathleen Lime, MS [facility] SLP [place] Office [phone] Pager [phone]

## 2020-12-13 NOTE — Plan of Care (Signed)
Patient remains on low-dose levophed this AM; weaning as tolerated. Plan for SLP eval today. Febrile this AM, PRN APAP given per order. No acute changes.   Problem: Education: Goal: Knowledge of General Education information will improve Description: Including pain rating scale, medication(s)/side effects and non-pharmacologic comfort measures Outcome: Progressing   Problem: Health Behavior/Discharge Planning: Goal: Ability to manage health-related needs will improve Outcome: Progressing   Problem: Clinical Measurements: Goal: Ability to maintain clinical measurements within normal limits will improve Outcome: Progressing Goal: Will remain free from infection Outcome: Progressing Goal: Diagnostic test results will improve Outcome: Progressing Goal: Respiratory complications will improve Outcome: Progressing Goal: Cardiovascular complication will be avoided Outcome: Progressing   Problem: Activity: Goal: Risk for activity intolerance will decrease Outcome: Progressing   Problem: Nutrition: Goal: Adequate nutrition will be maintained Outcome: Progressing   Problem: Coping: Goal: Level of anxiety will decrease Outcome: Progressing   Problem: Elimination: Goal: Will not experience complications related to bowel motility Outcome: Progressing Goal: Will not experience complications related to urinary retention Outcome: Progressing   Problem: Pain Managment: Goal: General experience of comfort will improve Outcome: Progressing   Problem: Safety: Goal: Ability to remain free from injury will improve Outcome: Progressing   Problem: Skin Integrity: Goal: Risk for impaired skin integrity will decrease Outcome: Progressing   Problem: Fluid Volume: Goal: Hemodynamic stability will improve Outcome: Progressing   Problem: Clinical Measurements: Goal: Diagnostic test results will improve Outcome: Progressing Goal: Signs and symptoms of infection will decrease Outcome:  Progressing   Problem: Respiratory: Goal: Ability to maintain adequate ventilation will improve Outcome: Progressing

## 2020-12-13 NOTE — Progress Notes (Signed)
[facility] ADULT ICU REPLACEMENT PROTOCOL   The patient does apply for the [facility] Adult ICU Electrolyte Replacment Protocol based on the criteria listed below:   1. Is GFR >/= 30 ml/min? Yes.    Patient's GFR today is >60 2. Is SCr </= 2? No. Patient's SCr is .42 ml/kg/hr 3. Did SCr increase >/= 0.5 in 24 hours? No. 4. Abnormal electrolyte(s): k+ 3.5, Mag 1.5 5. Ordered repletion with: standing orders 6. If a panic level lab has been reported, has the CCM MD in charge been notified? No..    Shannon [name] 12/13/2020 5:35 AM

## 2020-12-13 NOTE — Progress Notes (Signed)
Nutrition Follow-up  DOCUMENTATION CODES:   Severe malnutrition in context of chronic illness  INTERVENTION:  - increase Osmolite 1.5 to 30 ml/hr at this time and then increase by 10 ml every 12 hours to reach goal rate of 60 ml/hr with 90 ml Prosource TF BID and 1 packet Juven BID. - at goal rate, this regimen will provide 2510 kcal, 139 grams protein, and 1097 ml free water. - free water flush to continue to be per CCM.    NUTRITION DIAGNOSIS:   Severe Malnutrition related to chronic illness (Parkinson's disease) as evidenced by moderate fat depletion, moderate muscle depletion, severe muscle depletion, edema. -ongoing  GOAL:   Patient will meet greater than or equal to 90% of their needs -unmet with current TF rate.   MONITOR:   TF tolerance, Labs, Weight trends, Skin  REASON FOR ASSESSMENT:   Consult Enteral/tube feeding initiation and management  ASSESSMENT:   75 year-old male with medical history of quadriplegia, neurogenic bladder, sacral osteomyelitis, stage 4 sacral decubitus ulcer, bed bound, and severe protein calorie malnutrition. He is a long-term resident at [facility]. He presented to the ED on 6/17 after being found unresponsive at [facility].  Patient now noted to be a/o x3. He remains NPO. PEG in place and he is receiving Osmolite 1.5 @ 20 ml/hr with 90 ml Prosource TF BID, 1 packet Juven BID, and 200 ml free water TID added starting today. Serum K now WDL and Phos and Mg are trending toward WDL although still slightly low.   Weight today is more consistent with admission weight than weights recorded 6/19-6/21. Moderate pitting edema to BLE and perineal area and deep pitting edema to BUE documented in the edema section of flow sheet.    Labs reviewed; CBGs: 104, 107, 158 mg/dl, BUN: 32 mg/dl, creatinine: 0.42 mg/dl, Ca: 8.3 mg/dl, Phos: 2 mg/dl, Mg: 1.5 mg/dl, Alk Phos elevated.  Medications reviewed; 4 g IV Mg sulfate x1 run 6/22, 40 mg protonix/day, 40  mEq Klor-Con x2 doses 6/21 and x1 dose 6/22, 30 mmol IV KPhos x1 run 6/22. IVF; D5-LR @ 75 ml/hr (306 kcal/24 hrs).   Diet Order:   Diet Order             Diet NPO time specified  Diet effective now                   EDUCATION NEEDS:   No education needs have been identified at this time  Skin:  Skin Assessment: Skin Integrity Issues: Skin Integrity Issues:: Stage IV Stage IV: sacrum + osteomyelitis  Last BM:  6/21 (type 6 x3)  Height:   Ht Readings from Last 1 Encounters:  12/08/20 5' 7"  (1.702 m)    Weight:   Wt Readings from Last 1 Encounters:  12/13/20 90.5 kg      Estimated Nutritional Needs:  Kcal:  2300-2500 kcal Protein:  120-135 grams Fluid:  >/= 2 L/day      Jarome Matin, MS, RD, LDN, CNSC Inpatient Clinical Dietitian RD pager # available in [facility]  After hours/weekend pager # available in [facility]

## 2020-12-13 NOTE — Progress Notes (Signed)
Pharmacy Antibiotic Note  Shannon [name] is a 75 y.o. male admitted on 12/08/2020 with septic shock, history of sacral osteomyelitis.  Pharmacy has been consulted for cefepime and vancomycin dosing.  Plan: Continue Meropenem 1gm IV q8h Decrease to Vancomycin 1g IV q24h.  Expected AUC 456. Measure Vanc peak and trough at steady state.  Goal AUC = 400 - 550. Follow up renal function, culture results, and clinical course.   Height: 5\' 7"  (170.2 cm) Weight: 90.5 kg (199 lb 8.3 oz) IBW/kg (Calculated) : 66.1  Temp (24hrs), Avg:99.9 F (37.7 C), Min:96.4 F (35.8 C), Max:102.8 F (39.3 C)  Recent Labs  Lab 12/08/20 1715 12/08/20 2114 12/09/20 0011 12/09/20 0324 12/09/20 1423 12/10/20 0335 12/11/20 0329 12/12/20 0500 12/13/20 0357 12/13/20 0916  WBC  --   --   --  49.4*  --  47.2* 39.7* 20.6* 18.5*  --   CREATININE  --   --   --  1.04  --  0.72 0.58* <0.30* 0.42*  --   LATICACIDVEN 7.3* 9.7* 7.0*  --  2.8* 2.1*  --   --   --   --   VANCOTROUGH  --   --   --   --   --   --   --   --   --  15     Estimated Creatinine Clearance: 87 mL/min (A) (by C-G formula based on SCr of 0.42 mg/dL (L)).   Estimated creatinine clearance 41 ml/min  Allergies  Allergen Reactions   Iodine Swelling    NOT CT CONTRAST   Penicillins Swelling    ** tolerates cephalosporins Facial swelling, itchy throat    Antimicrobials this admission: 6/17 Cefepime >> 6/18 6/17 Vanc >> 6/18 Meropenem >>  Dose adjustments this admission: 6/18 Vanc 750mg  q24 > 1250mg  q24 6/22 0916 VT 15, 1217 VP 34.  Calculated AUC 570.  Change to Vanc 1g IV q24h for AUC 456 (Cmin 11.7, Cmax 28.7)  Microbiology results: 6/17 BCx: 1/4 staph epi, MR detected 6/17 UCx: mult sp-final 11/13/20 MRSA PCR: positive 6/21 BCx: ngtd  Thank you for allowing pharmacy to be a part of this patient's care. 7/17 PharmD, BCPS Clinical Pharmacist [facility] main pharmacy [phone] 12/13/2020 9:59 AM

## 2020-12-14 LAB — BASIC METABOLIC PANEL
Anion gap: 5 (ref 5–15)
BUN: 31 mg/dL — ABNORMAL HIGH (ref 8–23)
CO2: 32 mmol/L (ref 22–32)
Calcium: 7.9 mg/dL — ABNORMAL LOW (ref 8.9–10.3)
Chloride: 104 mmol/L (ref 98–111)
Creatinine, Ser: 0.45 mg/dL — ABNORMAL LOW (ref 0.61–1.24)
GFR, Estimated: >60 mL/min (ref >60)
Glucose, Bld: 142 mg/dL — ABNORMAL HIGH (ref 70–99)
Potassium: 3.7 mmol/L (ref 3.5–5.1)
Sodium: 141 mmol/L (ref 135–145)

## 2020-12-14 LAB — MAGNESIUM: Magnesium: 1.7 mg/dL (ref 1.7–2.4)

## 2020-12-14 LAB — GLUCOSE, CAPILLARY
Glucose-Capillary: 100 mg/dL — ABNORMAL HIGH (ref 70–99)
Glucose-Capillary: 106 mg/dL — ABNORMAL HIGH (ref 70–99)
Glucose-Capillary: 118 mg/dL — ABNORMAL HIGH (ref 70–99)
Glucose-Capillary: 124 mg/dL — ABNORMAL HIGH (ref 70–99)
Glucose-Capillary: 125 mg/dL — ABNORMAL HIGH (ref 70–99)
Glucose-Capillary: 140 mg/dL — ABNORMAL HIGH (ref 70–99)

## 2020-12-14 LAB — PHOSPHORUS: Phosphorus: 1.9 mg/dL — ABNORMAL LOW (ref 2.5–4.6)

## 2020-12-14 MED ORDER — POTASSIUM CHLORIDE CRYS ER 20 MEQ PO TBCR
40.0000 meq | EXTENDED_RELEASE_TABLET | Freq: Once | ORAL | Status: AC
  Administered 2020-12-14: 40 meq via ORAL
  Filled 2020-12-14: qty 2

## 2020-12-14 MED ORDER — MAGNESIUM SULFATE 2 GM/50ML IV SOLN
2.0000 g | Freq: Once | INTRAVENOUS | Status: AC
  Administered 2020-12-14: 2 g via INTRAVENOUS
  Filled 2020-12-14: qty 50

## 2020-12-14 NOTE — Plan of Care (Signed)
  Problem: Education: Goal: Knowledge of General Education information will improve Description: Including pain rating scale, medication(s)/side effects and non-pharmacologic comfort measures Outcome: Progressing   Problem: Pain Managment: Goal: General experience of comfort will improve Outcome: Progressing   Problem: Safety: Goal: Ability to remain free from injury will improve Outcome: Progressing   Problem: Fluid Volume: Goal: Hemodynamic stability will improve Outcome: Progressing

## 2020-12-14 NOTE — Progress Notes (Signed)
[facility]  [facility]) [facility] Liaison Note:   Patient is a current hospice patient with [facility]. Admitted to Hospice services on 10/27/20 with a terminal diagnosis of abnormal weight loss. On 12/08/2020, patient was found by staff at [facility] unresponsive, and hypotensive with SBPs 60s. Transported by EMS to [facility] ED per daughter's request. On arrival to the ED patient remained in shock and was started on NE infusion via PICC line and started on sepsis protocol receiving vancomycin and cefepime. Per Dr. Kirt Boys, [facility] MD, this is a related admission.   Visited at bedside. No family at bedside. Pt now on 2L O2, Levo discontinued as of this morning.  Pt AOx3, reports feeling "meh" today, reports sense of chills but nothing specifically wrong.  Pt denies SOB, pain, anxiety.  Pt appears clammy, no resp distress noted, sats on 2L [place] drop to 90% while pt talks, rebound to 93-94% while pt listening.  HR noted to be elevated at 104-112 during visit.  MD aware per bedside RN, Ashleigh. SLP cleared pt for PO intake for "small sips/bites, slow rate."  Meds still alternate route.  Pt reports his daughter's minister came to visit today and read from the Book of [employer]; pt reports this was a tremendous comfort.  Report exchanged with bedside RN.  This pt continues to be appropriate for inpatient services due to the need for IV medications for infection and electrolyte replacement.    V/S: 98.1 oral, 119/96 (100),  HR 110, RR 22, SPO2 93% 2L [place]   I/O:  5943.02/3044   Labs: BUN 31, Creat 0.45,  Ca 7.9, Phos 1.9, Mg 1.7   Diagnostics: none new   Problem List: Baseline Quadriplegia without tracheostomy - puts him at risk for acute respiratory failure in setting of sepsis and acute metabolic encephalopathy  Push pulmonary hygiene Based on his overall condition do not believe intubation and mechanical ventilation are in his best interest, they would almost certainly lead to chronic tracheostomy with  possible vent, institutionalization      Has chronic PICC (early April for osteo) Circulatory shock due to septic shock needing pressors - present on admission (on midodrine at baseline) and History of Sacral Osteomyelitis - April 2022 - associated with chronic baseline decub             Norepinephrine has been weaned but remains on 8.  Vasopressin off. -Consider further volume resuscitation -Stress dose steroids deferred as reassuring a.m. cortisol -Continue chronic midodrine -Continue meropenem, vancomycin -Continue wound care for decubitus ulcer > once daily cleansing with NS, silver Hydrofiber dressing.  No recommendation for surgical consult or debridement thus far -Repeat blood cultures performed 6/21 for surveillance given MRSE 1 of 2 as above.  If these remain negative suspect we can avoid changing out PICC line. -Chronic Foley was changed on 6/21 -Continue Lyrica.  Goal restart Cymbalta when he can take oral medications.  We are avoiding opioids given their hemodynamic impact   Protein calorie malnutrition, severe, Chronic PEG    Continue tube feeding as ordered -Oral intake when he is cleared by SLP.  He will not be able to meet his caloric needs with oral intake so TF will need to continue     Discharge Planning: Back to facility when medically optimized   Family Contact: Left a HIPAA-compliant voicemail for daughter [name].  No response at this time.   IDT: Hospice team updated   Goals of Care: Full code; daughter continues to desire all  measures taken to treat any and all conditions.     At time of discharge please use [facility] for transport.   Thank you for the opportunity to participate in this patient's care.  Gillian Scarce, BSN, RN [facility] Liaison (listed on [place] under Hospice/[facility])    [phone] (24h on call)

## 2020-12-14 NOTE — Progress Notes (Signed)
SLP Cancellation Note  Patient Details Name: Shannon [name] MRN: [id number] DOB: 04-04-1946   Cancelled treatment:       Reason Eval/Treat Not Completed: Other (comment) (in discussion with day time RN, Ashleigh, pt not appropriate for po at this time due to current mentation/medical status, will continue efforts, pt has PEG for nutrition)  Rolena Infante, MS [facility] SLP Acute Rehab Services Office [phone] Pager [phone] Chales Abrahams 12/14/2020, 7:09 PM

## 2020-12-14 NOTE — Progress Notes (Signed)
[facility] ADULT ICU REPLACEMENT PROTOCOL   The patient does apply for the [facility] Adult ICU Electrolyte Replacment Protocol based on the criteria listed below:   1. Is GFR >/= 30 ml/min? Yes.    Patient's GFR today is >60 2. Is SCr </= 2? No Patient's SCr is .45 ml/kg/hr 3. Did SCr increase >/= 0.5 in 24 hours? No. 4. Abnormal electrolyte(s): K+ 3.7, mag 1.7  5. Ordered repletion with: standing orders 6. If a panic level lab has been reported, has the CCM MD in charge been notified? No..   Physician:  Melvern Banker 12/14/2020 5:38 AM

## 2020-12-14 NOTE — Progress Notes (Signed)
NAME:  Shannon [name], MRN:  [id number], DOB:  11/27/1945, LOS: 6 ADMISSION DATE:  12/08/2020, CONSULTATION DATE:  6/17 REFERRING MD:  Joya Gaskins - EM CHIEF COMPLAINT:  Unresponsive, shock   BRIEF  75 yo PMH quadriplegia, neurogenic bladder, sacral osteomyelitis and stage IV sacral decubitus ulcer , bed bound,  Long  term resident of  [facility], severe protein calorie malnutrition. Hospice at [facility] was initiated early May 2022. Then, with hematuria and hemorrhagic shock  due to traumatic foley at [facility] requiring admission/cysto/blood clot evac/fulguration. Admitted 5/22 - 11/18/20  and discharged back to [place]. Due to hospice status abx for osteo were stopped (vanc and ertapenem) continued receiving hospice care though as full code. He then presented to ED 12/08/20 after being found unresponsive at care facility. Usual state of health 6/16. Then on 12/08/2020 found by staff unresponsive, and hypotensive SBPs 60s on EMS arrival which did not improve with IVF and was started on an epi infusion by EMS. On arrival to the ED patient remained in shock, was started on NE infusion via PICC line (chronic LUE - present on admission) and started on sepsis protocol receiving vanc and cefepime   LA 8.1 WBC 26 Cr 1.61 (baseline 0.4) AG 14 (prior <5) alk phos 226 AST 229 ALT 99 Trop 3292 Ddimer >20,000 . CXR with L PICC, small R pleural effusion . CT chest with RLL pneumonia (aspn v HCA); he does not have trach. Started on pressors and s/p 3L fluid by ER at time of CCM eval 12/08/2020 and more responsive. No hx of bleeing this admit. 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   has a past surgical history that includes Rotator cuff repair (Right); Spine surgery; Incision and drainage (Left, 2012); Back surgery; Anterior cervical decomp/discectomy fusion (N/A, 04/05/2020); Anterior cervical decomp/discectomy fusion (N/A, 04/22/2020); IR GASTROSTOMY TUBE MOD SED (07/05/2020); and Cystoscopy (N/A, 11/13/2020).   MICRO     Significant Hospital Events: Including procedures, antibiotic start and stop dates in addition to other pertinent events   6/17 unresponsive at [facility]. On EMS arrival, SBPs 60-- refractory to Ivf and started on epi. Changed to NE in ED. Started on vanc, cefepime. Is a hospice pt but family is contemplating revoking this.  6/17 - seen in [place] and follows some commands. Weak flicker movement on uppers. PRotecting airway. CTA bilarlly. Abd soft. Normal heart sounds 6/18 - -more awake and he nods and tries to follow commands.  On 15 L nonrebreather mask.  He is on Levophed 26 mcg via the PICC line is on D5 LR.  Cultures with no growth.  Febrile 101.  White count is 40 9.4K on Vanco and cefepime.  Procalcitonin is greater than 150.  Lactic acid is still persistently high without clearance lactate greater than 7.  Hospice nurse visited him at the bedside and explained that end of May 2022 when patient was admitted for his traumatic Foley hematuria and he was discharged.  His antibiotics for sacral osteomyelitis were held because of his hospice  status [he is hospice and concomitantly full code].  At this point in time he continues to have his hospice benefits though is full code. Blood cx 6/17 >>  GPC clusters > MRSE (per BCID >   Interim History / Subjective:   Norepinephrine at 8 (slightly increased), vasopressin off I/O+ 6.6 L total Hypomagnesemia, hypophosphatemia noted Evaluated by SLP 6/22  Objective    Blood pressure 119/64, pulse 85, temperature 99.2 F (37.3 C), temperature source Oral, resp. rate 19, height 5' 7"  (1.702 m), weight 92.3 kg, SpO2 100 %.        Intake/Output Summary (Last 24 hours) at 12/14/2020 0731 Last data filed at 12/14/2020 0600 Gross per 24 hour  Intake 4204.49 ml  Output 3045 ml  Net 1159.49 ml   Filed Weights   12/12/20 0500 12/13/20 0400 12/14/20 0500  Weight: 94 kg 90.5 kg 92.3 kg    Examination: Gen: Elderly man, comfortable HEENT: Oropharynx clear, no secretions, strong voice Neck: No stridor Lungs: Decreased breath sounds at both bases, otherwise clear CV: Distant, regular, no murmur Abd: Nondistended, positive bowel sounds, PEG Ext: Bilateral upper and lower extremity edema, foot drop boots in place Skin: No rash.  Sacral decubitus wound, dressing changed overnight last night.  Not examined today.  He has stage II pressure injury on his posterior scalp Neuro: Awake, stronger and interacting more.  Oriented to self, place, not date.  Follows commands.  Strong cough.  Able to move fingers slightly   Labs/imaging that I have personally reviewed  (right click and "Reselect all SmartList Selections" daily)   Labs and studies reviewed 6/23   Resolved Hospital Problem list   Lactic acidosis  Shock liver  Assessment & Plan:   ASSESSMENT / PLAN:    Baseline Quadriplegia without tracheostomy - puts him at risk for acute respiratory failure in setting of sepsi and acute metabolic encephalopathy -Remains on heated high flow nasal cannula, mild to moderate cough strength P:   -Continue to push pulmonary hygiene -Wean O2 as able -Poor candidate for mechanical ventilation should he decline.  Do not believe he would survive to liberation from MV.  Will need to continue to discuss this issue with patient's daughter.  At this point she would want all aggressive interventions made if he were to develop respiratory failure  Baseline: bed bound  Quadriplegia - prior to admission Present acutely on admission: Acute metabolic obtunded encephalopathy due to sepsis -Some improvement in mental status with treatment of sepsis remains globally profoundly weak P:   -Mental status and strength both improved as we have treated his underlying infection, septic shock. -SLP following for ability to tolerate oral medications, oral nutrition.  Septic shock present on admission (on midodrine at baseline): source chronic sacral wound w associated osteo, and possible RLL PNA 1 of 2 Blood Cx MRSE, ? 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 Antibiotics were withheld end of May 2022 because of discharge to hospice status P:  -Norepinephrine has been weaned but remains on 8.  Vasopressin off. -Consider further volume resuscitation -Stress dose steroids deferred as reassuring a.m. cortisol -Continue chronic midodrine -Continue meropenem, vancomycin -Continue wound care for decubitus ulcer > once daily cleansing with NS, silver Hydrofiber dressing.  No recommendation for surgical consult or debridement thus far -Repeat blood cultures performed 6/21 for surveillance given MRSE 1 of 2 as above.  If these remain negative suspect we can avoid changing out  PICC line. -Chronic Foley was changed on 6/21 -Continue Lyrica.  Goal restart Cymbalta when he can take oral medications.  We are avoiding opioids given their hemodynamic impact  Chronic Grad1 Diast Dysfunction  - echo May 2022 Current concern for NSTEMI  - possbly type 2 = @ admission -Echo 6/18 with no focal wall motion abnormalities and good pump function.  Troponin downtrending from 12/08/2020 P: -Stress non-STEMI, heparin deferred -Continuous telemetry  Has chronic indwelling foley - at risk for recurrent UTI Has baseline non obstructive nephrolithiasis P:  -Polymicrobial from chronic Foley, changed on 6/21 -Adequately covered  on his current antibiotics  Hypophosphatemia Hypomagnesemia P: -Has remained low, repeat replacement 6/23 -Following BMP  Protein calorie malnutrition, severe Chronic PEG P: -Continue tube feeding as ordered -Oral intake when he is cleared by SLP.  He will not be able to meet his caloric needs with oral intake so TF will need to continue  Chronic anemia due to chronic illness Acute on chronic anemia due to critical illness, septic shock P:  -Continue to follow CBC intermittently -Transfusion threshold Hgb 7.0  Mild thromboyctopenia - acute present on admit. Likely due to sepsis (baseline high 100-200s) -elevated d-dimer with schistocytes, normal fibrinogen. Consistent sepsis or DIC.  Heparin antibody negative LE doppler US 6/20 > no evidence LE DVT P -Restarted subcutaneous heparin prophylaxis on 6/21, tolerating -Follow CBC   At risk for hypo and hyperglycemia P:   -Tube feeding running -Stop D5 LR on 6/23 -Order sliding-scale insulin if shows any evidence of hyperglycemia, following CBG  Best Practice (right click and "Reselect all SmartList Selections" daily)   Diet/type: tubefeeds and NPO Pain/Anxiety/Delirium protocol Not indicated VAP protocol (if indicated): Not indicated DVT prophylaxis: prophylactic heparin restarted 6/21 GI prophylaxis: PPI Glucose control:  not indicated Central venous access:  Yes, and it is still needed - LUE PICC chronic Arterial line:  N/A Foley:  Yes, and it is still needed - chronic indwelling Mobility:  bed rest  PT consulted: N/A Studies pending: None Culture data pending:blood Last reviewed culture data: 6/23 Antibiotics:vanc and meropenem Antibiotic de-escalation: no,  continue current rx Stop date: to be determined  Daily labs: ordered Code Status:  full code  Last date of multidisciplinary goals of care discussion: 6/20 Dr Lamonte Sakai discussed status, plans and prognosis with [name] by phone 6/20: She is clear  that she agrees with current aggressive care, does not want to lose her father, is not interested in speaking with Hospice or in de-escalation.  I explained that we are supporting aggressively to get through this acute decompensation due to septic shock, but that there is likely no way to resolve the underlying cause, namely his decubitus ulcer and osteo - ultimately it as well as his debilitation from paraplegia will take his life.  As pertains to her goal to get through this acute event, I assured her that were are being aggressive, and that I will commit to continuing all CC interventions that might have benefit.  I also explained that if there are life sustaining interventions from which I do not believe he can recover to his previous QOL, then I would recommend deferring. I will have to talk to her further about possible limitations - for example DNI status, no trach, etc. Will continue to update her and discuss.   Family: updated daughter on current status and plans 6/22    [place]   The patient Shannon [name] is critically ill with multiple organ  systems failure and requires high complexity decision making for assessment and support, frequent evaluation and titration of therapies, application of advanced monitoring technologies and extensive interpretation of multiple databases.   Independent CC time: 32 minutes   Baltazar Apo, MD, PhD 12/14/2020, 7:31 AM [facility] [phone] or if no answer before 7:00PM call [phone] For any issues after 7:00PM please call eLink [phone]     LABS    PULMONARY Recent Labs  Lab 12/09/20 0543 12/09/20 1310  PHART 7.383 7.404  PCO2ART 47.1 43.6  PO2ART 191* 56.8*  HCO3 27.4 26.4  O2SAT 99.8 87.9    CBC Recent Labs  Lab 12/11/20 0329 12/12/20 0500 12/13/20 0357  HGB 8.0* 7.4* 8.7*  HCT 25.5* 23.9* 27.6*  WBC 39.7* 20.6* 18.5*  PLT 79* 75* 102*    COAGULATION Recent Labs  Lab  12/08/20 1351 12/09/20 0324 12/10/20 0100 12/10/20 0335  INR 1.5* 1.6* 1.4* 1.5*    CARDIAC  No results for input(s): TROPONINI in the last 168 hours. No results for input(s): PROBNP in the last 168 hours.   CHEMISTRY Recent Labs  Lab 12/10/20 0335 12/11/20 0329 12/12/20 0500 12/13/20 0357 12/14/20 0255  NA 135 132* 131* 142 141  K 2.9* 2.9* 2.8* 3.7 3.7  CL 101 101 97* 104 104  CO2 28 27 28  33* 32  GLUCOSE 166* 165* 168* 110* 142*  BUN 26* 23 26* 32* 31*  CREATININE 0.72 0.58* <0.30* 0.42* 0.45*  CALCIUM 8.1* 7.8* 8.1* 8.3* 7.9*  MG 1.4* 1.5* 1.7 1.5* 1.7  PHOS 2.1* 1.7* 1.8* 2.0* 1.9*   Estimated Creatinine Clearance: 87.8 mL/min (A) (by C-G formula based on SCr of 0.45 mg/dL (L)).   LIVER Recent Labs  Lab 12/08/20 1351 12/09/20 0324 12/10/20 0100 12/10/20 0335 12/11/20 0329 12/12/20 0500 12/13/20 0357  AST 229* 195*  --  100* 75* 57* 40  ALT 99* 91*  --  56* 48* 46* 40  ALKPHOS 226* 211*  --  159* 160* 173* 166*  BILITOT 1.5* 1.2  --  0.8 0.9 0.9 1.0  PROT 7.4 7.9  --  6.4* 6.0* 6.2* 6.7  ALBUMIN 2.2* 2.2*  --  1.7* 1.7* 1.6* 1.8*  INR 1.5* 1.6* 1.4* 1.5*  --   --   --      INFECTIOUS Recent Labs  Lab 12/08/20 1916 12/08/20 2114 12/09/20 0011 12/09/20 1423 12/10/20 0335  LATICACIDVEN  --    < > 7.0* 2.8* 2.1*  PROCALCITON >150.00  --   --   --   --    < > = values in this interval not displayed.     ENDOCRINE CBG (last 3)  Recent Labs    12/13/20 2329 12/14/20 0419 12/14/20 0727  GLUCAP 128* 140* 125*

## 2020-12-15 DIAGNOSIS — M4628 Osteomyelitis of vertebra, sacral and sacrococcygeal region: Secondary | ICD-10-CM

## 2020-12-15 LAB — GLUCOSE, CAPILLARY
Glucose-Capillary: 120 mg/dL — ABNORMAL HIGH (ref 70–99)
Glucose-Capillary: 135 mg/dL — ABNORMAL HIGH (ref 70–99)
Glucose-Capillary: 126 mg/dL — ABNORMAL HIGH (ref 70–99)
Glucose-Capillary: 115 mg/dL — ABNORMAL HIGH (ref 70–99)
Glucose-Capillary: 137 mg/dL — ABNORMAL HIGH (ref 70–99)
Glucose-Capillary: 93 mg/dL (ref 70–99)

## 2020-12-15 MED ORDER — POTASSIUM PHOSPHATES 15 MMOLE/5ML IV SOLN
30.0000 mmol | Freq: Once | INTRAVENOUS | Status: AC
  Administered 2020-12-15: 30 mmol via INTRAVENOUS
  Filled 2020-12-15: qty 10

## 2020-12-15 MED ORDER — JUVEN PO PACK
1.0000 | PACK | Freq: Two times a day (BID) | ORAL | Status: DC
  Administered 2020-12-15 – 2020-12-20 (×11): 1
  Filled 2020-12-15 (×12): qty 1

## 2020-12-15 NOTE — Progress Notes (Signed)
NAME:  Shannon [name], MRN:  [id number], DOB:  April 19, 1946, LOS: 7 ADMISSION DATE:  12/08/2020, CONSULTATION DATE:  6/17 REFERRING MD:  Joya Gaskins - EM CHIEF COMPLAINT:  Unresponsive, shock   BRIEF  75 yo PMH quadriplegia, neurogenic bladder, sacral osteomyelitis and stage IV sacral decubitus ulcer , bed bound,  Long  term resident of  [facility], severe protein calorie malnutrition. Hospice at [facility] was initiated early May 2022. Then, with hematuria and hemorrhagic shock  due to traumatic foley at [facility] requiring admission/cysto/blood clot evac/fulguration. Admitted 5/22 - 11/18/20  and discharged back to [place]. Due to hospice status abx for osteo were stopped (vanc and ertapenem) continued receiving hospice care though as full code. He then presented to ED 12/08/20 after being found unresponsive at care facility. Usual state of health 6/16. Then on 12/08/2020 found by staff unresponsive, and hypotensive SBPs 60s on EMS arrival which did not improve with IVF and was started on an epi infusion by EMS. On arrival to the ED patient remained in shock, was started on NE infusion via PICC line (chronic LUE - present on admission) and started on sepsis protocol receiving vanc and cefepime   LA 8.1 WBC 26 Cr 1.61 (baseline 0.4) AG 14 (prior <5) alk phos 226 AST 229 ALT 99 Trop 3292 Ddimer >20,000 . CXR with L PICC, small R pleural effusion . CT chest with RLL pneumonia (aspn v HCA); he does not have trach. Started on pressors and s/p 3L fluid by ER at time of CCM eval 12/08/2020 and more responsive. No hx of bleeing this admit. 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   has a past surgical history that includes Rotator cuff repair (Right); Spine surgery; Incision and drainage (Left, 2012); Back surgery; Anterior cervical decomp/discectomy fusion (N/A, 04/05/2020); Anterior cervical decomp/discectomy fusion (N/A, 04/22/2020); IR GASTROSTOMY TUBE MOD SED (07/05/2020); and Cystoscopy (N/A, 11/13/2020).   MICRO  Blood culture 6/17 with staph epidermidis Urine culture 6/17-multiple organisms, suggest reculture  Significant Hospital Events: Including procedures, antibiotic start and stop dates in addition to other pertinent events   6/17 unresponsive at [facility]. On EMS arrival, SBPs 60-- refractory to Ivf and started on epi. Changed to NE in ED. Started on vanc, cefepime. Is a hospice pt but family is contemplating revoking this.  6/17 - seen in [place] and follows some commands. Weak flicker movement on uppers. PRotecting airway. CTA bilarlly. Abd soft. Normal heart sounds 6/18 - -more awake and he nods and tries to follow commands.  On 15 L nonrebreather mask.  He is on Levophed 26 mcg via the PICC line is on D5 LR.  Cultures with no growth.  Febrile 101.  White count is 40 9.4K on Vanco and cefepime.  Procalcitonin is greater than 150.  Lactic acid is still persistently high without clearance lactate greater than 7.  Hospice nurse visited him at the bedside and explained that end of May 2022 when patient was admitted for his traumatic Foley hematuria and he was discharged.  His  antibiotics for sacral osteomyelitis were held because of his hospice status [he is hospice and concomitantly full code].  At this point in time he continues to have his hospice benefits though is full code. Blood cx 6/17 >>  GPC clusters > MRSE (per BCID >  6/24 levo was restarted  Interim History / Subjective:   Required resumption of  norepinephrine last evening I/O+ 5 L positive-improving Hypomagnesemia, hypophosphatemia noted-being corrected Evaluated by SLP 6/22  Objective   Blood pressure (!) 113/53, pulse (!) 109, temperature 97.7 F (36.5 C), temperature source Oral, resp. rate (!) 22, height 5' 7"  (1.702 m), weight 94.4 kg, SpO2 94 %.        Intake/Output Summary (Last 24 hours) at 12/15/2020 0809 Last data filed at 12/15/2020 0730 Gross per 24 hour  Intake 1461.7 ml  Output 2775 ml  Net -1313.3 ml   Filed Weights   12/13/20 0400 12/14/20 0500 12/15/20 0343  Weight: 90.5 kg 92.3 kg 94.4 kg    Examination: Gen: Elderly gentleman, comfortable, denies any pain or discomfort HEENT: Moist oral mucosa Neck: Supple, no JVD Lungs: Decreased breath sounds bibasilarly CV: S1-S2 appreciated with no murmur Abd: PEG in place, bowel sounds appreciated Ext: Lower extremity edema bilaterally Skin: Posterior scalp pressure injury, sacral decubitus wound  neuro: Alert and interactive, following commands, weak   Labs/imaging that I have personally reviewed  (right click and "Reselect all SmartList Selections" daily)   Labs and studies reviewed 6/24 Hypophosphatemia Hypomagnesemia   Resolved Hospital Problem list   Lactic acidosis  Shock liver  Assessment & Plan:   Quadriplegia Respiratory insufficiency -Remains on oxygen supplementation -Poor candidate for mechanical ventilation should he decline -Daughter was the POA desires aggressive measures if needed  Encephalopathy -This appears to be stabilizing -Overall mentation appears to be improving -Remains profoundly weak  Septic shock-present on admission Source of infection appears to be chronic sacral wound with associated osteomyelitis, possible right lower lobe pneumonia Has a chronic PICC-placed for osteomyelitis Sacral osteomyelitis April 2022-antibiotics were held May 2022 as patient was discharged to hospice status at the time -Still  requiring pressors -Midodrine ordered -Continue antibiotics-including meropenem and vancomycin -Continue wound care -Follow-up on repeat blood cultures performed 6/21-negative to date -Chronic Foley-changed 6/21  History of diastolic dysfunction on echocardiogram May 2022 Concern for NSTEMI -Has remained hemodynamically stable, did not need anticoagulation  Chronic indwelling Foley History of nephrolithiasis -Foley was replaced 6/21  Electrolyte derangement including hypophosphatemia, hypomagnesemia -Being replaced  Severe protein calorie malnutrition Chronic PEG -Continue tube feeds  Chronic anemia due to chronic illness Acute on chronic anemia due to critical illness, septic shock Mild thrombocytopenia-likely related to sepsis -Continue to trend -On subcu heparin for DVT prophylaxis -Trend labs  Best Practice (right click and "Reselect all SmartList Selections" daily)   Diet/type: tubefeeds Pain/Anxiety/Delirium protocol Not indicated VAP protocol (if indicated): Not indicated DVT prophylaxis: prophylactic heparin restarted 6/21 GI prophylaxis: PPI Glucose control:  not indicated Central venous access:  Yes, and it is still needed - LUE PICC chronic Arterial line:  N/A Foley:  Yes, and it is still needed - chronic indwelling Mobility:  bed rest  PT consulted: N/A Studies pending: None Culture data pending:blood Last reviewed culture data:today Antibiotics:vanc and meropenem Antibiotic de-escalation: no,  continue current rx Stop date: to be determined  Daily labs: ordered Code Status:  full code  Last date of multidisciplinary goals of care discussion: 6/20 Dr Lamonte Sakai discussed status, plans and prognosis with [name] by  phone 6/20: She is clear that she agrees with current aggressive care, does not want to lose her father, is not interested in speaking with Hospice or in de-escalation.  I explained that we are supporting aggressively to get through  this acute decompensation due to septic shock, but that there is likely no way to resolve the underlying cause, namely his decubitus ulcer and osteo - ultimately it as well as his debilitation from paraplegia will take his life.  As pertains to her goal to get through this acute event, I assured her that were are being aggressive, and that I will commit to continuing all CC interventions that might have benefit.  I also explained that if there are life sustaining interventions from which I do not believe he can recover to his previous QOL, then I would recommend deferring. I will have to talk to her further about possible limitations - for example DNI status, no trach, etc. Will continue to update her and discuss.   Family: updated daughter on current status and plans 6/22    [place]   The patient is critically ill with multiple organ systems failure and requires high complexity decision making for assessment and support, frequent evaluation and titration of therapies, application of advanced monitoring technologies and extensive interpretation of multiple databases. Critical Care Time devoted to patient care services described in this note independent of APP/resident time (if applicable)  is 32 minutes.   Sherrilyn Rist MD [facility] Personal pager: [phone] If unanswered, please page CCM On-call: [phone]    LABS    PULMONARY Recent Labs  Lab 12/09/20 0543 12/09/20 1310  PHART 7.383 7.404  PCO2ART 47.1 43.6  PO2ART 191* 56.8*  HCO3 27.4 26.4  O2SAT 99.8 87.9    CBC Recent Labs  Lab 12/11/20 0329 12/12/20 0500 12/13/20 0357  HGB 8.0* 7.4* 8.7*  HCT 25.5* 23.9* 27.6*  WBC 39.7* 20.6* 18.5*  PLT 79* 75* 102*    COAGULATION Recent Labs  Lab 12/08/20 1351 12/09/20 0324 12/10/20 0100 12/10/20 0335  INR 1.5* 1.6* 1.4* 1.5*    CARDIAC  No results for input(s): TROPONINI in the last 168 hours. No results for input(s):  PROBNP in the last 168 hours.   CHEMISTRY Recent Labs  Lab 12/10/20 0335 12/11/20 0329 12/12/20 0500 12/13/20 0357 12/14/20 0255  NA 135 132* 131* 142 141  K 2.9* 2.9* 2.8* 3.7 3.7  CL 101 101 97* 104 104  CO2 28 27 28  33* 32  GLUCOSE 166* 165* 168* 110* 142*  BUN 26* 23 26* 32* 31*  CREATININE 0.72 0.58* <0.30* 0.42* 0.45*  CALCIUM 8.1* 7.8* 8.1* 8.3* 7.9*  MG 1.4* 1.5* 1.7 1.5* 1.7  PHOS 2.1* 1.7* 1.8* 2.0* 1.9*   Estimated Creatinine Clearance: 88.7 mL/min (A) (by C-G formula based on SCr of 0.45 mg/dL (L)).   LIVER Recent Labs  Lab 12/08/20 1351 12/09/20 0324 12/10/20 0100 12/10/20 0335 12/11/20 0329 12/12/20 0500 12/13/20 0357  AST 229* 195*  --  100* 75* 57* 40  ALT 99* 91*  --  56* 48* 46* 40  ALKPHOS 226* 211*  --  159* 160* 173* 166*  BILITOT 1.5* 1.2  --  0.8 0.9 0.9 1.0  PROT 7.4 7.9  --  6.4* 6.0* 6.2* 6.7  ALBUMIN 2.2* 2.2*  --  1.7* 1.7* 1.6* 1.8*  INR 1.5* 1.6* 1.4* 1.5*  --   --   --      INFECTIOUS Recent Labs  Lab  12/08/20 1916 12/08/20 2114 12/09/20 0011 12/09/20 1423 12/10/20 0335  LATICACIDVEN  --    < > 7.0* 2.8* 2.1*  PROCALCITON >150.00  --   --   --   --    < > = values in this interval not displayed.     ENDOCRINE CBG (last 3)  Recent Labs    12/14/20 2341 12/15/20 0322 12/15/20 0735  GLUCAP 106* 120* 135*

## 2020-12-15 NOTE — Consult Note (Signed)
[facility]                                                                                        Patient Identification: Patient Name: Shannon [name] MRN: [id number] Admit Date: 12/08/2020  1:29 PM Today's Date: 12/15/2020 Reason for consult:  Requesting provider:   Active Problems:   Sepsis (HCC)   Antibiotics:-Vancomycin 6/17-current                     meropenem 6/18-current  Lines/Tubes: Left upper quadrant gastrostomy, urethral catheter, picc line  Assessment Shock resolved - off pressors Quadriplegia/Bedbound Severe Malnutrition Sacral ulcer/osteomyelitis MRSE in 1 out of 2 blood culture bottles. TTE with no vegetations PICC line, present for 32 days Right lower lobe pneumonia in CT chest with stable oxygen requirement Leukocytosis and Thrombocytopenia - improving   Comment-I doubt the septic presentation of this patient is related to his sacral osteomyelitis without at true bacteremia.  His blood cultures 1 out of 4 bottles  positive for staph epidermidis is most likely a contaminant.  Pneumonia is doubtful given his stable oxygen requirement.  However he has a PICC line in place that has been in place for 32 days which is a possible source of infection for this patient   Recommendations  -Recommend to DC PICC line,can place a new line if required  after removal of current line as blood cultures are no growth in 72 hrs -I do not see much  benefit of re-treating him with another  prolonged IV antibiotics given his bedbound state /quadriplegia and possibly this is a chronic osteomyelitis by this time. His sacral ulcer appears relatively clean  - Can treat for SSTI with IV Vancomycin and Meropenem for 14 days - Will sign off for now. Please call with questions  Rest of the management as per the primary team. Please call with questions or concerns.  Thank you for the  consult  Odette Fraction, MD Infectious Disease Physician [facility] [address] [place], [place] [address] Phone: [phone]  Fax: [phone]  __________________________________________________________________________________________________________ HPI and Hospital Course: History obtained from chart review due to patient's condition.  75 yo PMH quadriplegia, neurogenic bladder, sacral osteomyelitis and stage IV sacral decubitus ulcer , bed bound,  Long  term resident of  [facility], severe protein calorie malnutrition. Hospice at [facility] was initiated early May 2022. Then, with hematuria and hemorrhagic shock  due to traumatic foley at [facility] requiring admission/cysto/blood clot evac/fulguration. Admitted 5/22 - 11/18/20  and discharged back to [facility]. Due to hospice status abx for osteo were stopped (vanc and ertapenem) continued receiving hospice care though as full code. He then presented to ED 12/08/20 after being found unresponsive at care facility. Usual state of health 6/16. Then on 12/08/2020 found by staff unresponsive, and hypotensive SBPs 60s on EMS arrival which did not improve with IVF and was started on an epi infusion by EMS. On arrival to the ED patient remained in shock, was started on NE infusion via PICC line (chronic LUE - present on admission) and started  on sepsis protocol with bsa.   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 Newell Coral, 1992-->surg at [facility].  Summer 2021->Cervicalgia,arm pain, hand atrophy RUE wkness, hyperreflex (Dr. 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->to get surgery 03/2020   Hay fever    Hypertension    Osteoarthritis of both hands    Past Surgical History:  Procedure  Laterality Date   ANTERIOR CERVICAL DECOMP/DISCECTOMY FUSION N/A 04/05/2020   Procedure: ANTERIOR CERVICAL DECOMPRESSION/DISCECTOMY FUSION, INTERBODY PROSTHESIS, PLATE/SCREWS CERVICAL THREE-CERVICAL FOUR, CERVICAL FOUR- CERVICAL FIVE;  Surgeon: Tressie Stalker, MD;  Location: [facility] OR;  Service: Neurosurgery;  Laterality: N/A;   ANTERIOR CERVICAL DECOMP/DISCECTOMY FUSION N/A 04/22/2020   Procedure: Reexploration of anterior cervical wound for epidural hematoma;  Surgeon: Donalee Citrin, MD;  Location: [facility] OR;  Service: Neurosurgery;  Laterality: N/A;   BACK SURGERY     CYSTOSCOPY N/A 11/13/2020   Procedure: CYSTOSCOPY, CLOT EVACUATION, FULGURATION;  Surgeon: Noel Christmas, MD;  Location: [facility] ORS;  Service: Urology;  Laterality: N/A;   INCISION AND DRAINAGE Left 2012   L hand infection   IR GASTROSTOMY TUBE MOD SED  07/05/2020   ROTATOR CUFF REPAIR Right    x 3   SPINE SURGERY       Scheduled Meds:  chlorhexidine  15 mL Mouth Rinse BID   Chlorhexidine Gluconate Cloth  6 each Topical Daily   feeding supplement (OSMOLITE 1.5 CAL)  1,000 mL Per Tube Q24H   feeding supplement (PROSource TF)  90 mL Per Tube BID   free water  200 mL Per Tube Q8H   heparin injection (subcutaneous)  5,000 Units Subcutaneous Q8H   insulin aspart  0-15 Units Subcutaneous Q4H   mouth rinse  15 mL Mouth Rinse q12n4p   midodrine  10 mg Per Tube TID with meals   nutrition supplement (JUVEN)  1 packet Per Tube BID BM   pantoprazole sodium  40 mg Per Tube Daily   pregabalin  100 mg Per Tube BID   Continuous Infusions:  sodium chloride 10 mL/hr at 12/15/20 1600   meropenem (MERREM) IV Stopped (12/15/20 1226)   norepinephrine (LEVOPHED) Adult infusion Stopped (12/15/20 1208)   potassium PHOSPHATE IVPB (in mmol) 85 mL/hr at 12/15/20 1600   vancomycin Stopped (12/15/20 1331)   PRN Meds:.acetaminophen (TYLENOL) oral liquid 160 mg/5 mL, docusate sodium, polyethylene glycol  Allergies  Allergen Reactions   Iodine  Swelling    NOT CT CONTRAST   Penicillins Swelling    ** tolerates cephalosporins Facial swelling, itchy throat   Social History   Socioeconomic History   Marital status: Divorced    Spouse name: Not on file   Number of children: Not on file   Years of education: Not on file   Highest education level: Not on file  Occupational History   Not on file  Tobacco Use   Smoking status: Former    Pack years: 0.00   Smokeless tobacco: Never  Vaping Use   Vaping Use: Never used  Substance and Sexual Activity   Alcohol use: Not Currently   Drug use: Never   Sexual activity: Not Currently  Other Topics Concern   Not on file  Social History Narrative   Divorced, one daughter.   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  Veteran: Electronics engineer.  Last went to [place] [facility] 1992.   Tob: former.   Alc: no   Social Determinants of Corporate investment banker Strain: Not on file  Food Insecurity: Not on file  Transportation Needs: Not on file  Physical Activity: Not on file  Stress: Not on file  Social Connections: Not on file  Intimate Partner Violence: Not on file    Vitals BP (!) 140/59   Pulse (!) 106   Temp 99 F (37.2 C) (Oral)   Resp 20   Ht 5\' 7"  (1.702 m)   Wt 94.4 kg   SpO2 95%   BMI 32.60 kg/m   Physical Exam Constitutional:  tracheomstomy n place     Comments: on Nasal cannula   Cardiovascular:     Rate and Rhythm: Normal rate and regular rhythm.     Heart sounds: coarse breath sounds  Pulmonary:     Effort:no acute respiratory distress    Comments:   Abdominal:     Palpations: Abdomen is soft.     Tenderness: non tender and non distended   Musculoskeletal:        General: Quadriplegia   Skin:    Comments: sacral ulcer , looks clean   Neurological:     General: Quadriplegia   Psychiatric:        Mood and Affect: Unable to assess  Pertinent Microbiology Results for orders placed or performed during the hospital encounter of 12/08/20  Blood Culture  (routine x 2)     Status: Abnormal   Collection Time: 12/08/20  1:51 PM   Specimen: BLOOD  Result Value Ref Range Status   Specimen Description   Final    BLOOD BLOOD RIGHT FOREARM Performed at [facility], [address] [place], [place]    Special Requests   Final    BOTTLES DRAWN AEROBIC AND ANAEROBIC Blood Culture adequate volume Performed at [facility], [address], [place], [place]    Culture  Setup Time   Final    GRAM POSITIVE COCCI IN CLUSTERS ANAEROBIC BOTTLE ONLY Organism ID to follow CRITICAL RESULT CALLED TO, READ BACK BY AND VERIFIED WITH: PHARMD ELLEN JACKSON BY MESSAN H. AT 0315 ON 12/11/2020    Culture (A)  Final    STAPHYLOCOCCUS EPIDERMIDIS THE SIGNIFICANCE OF ISOLATING THIS ORGANISM FROM A SINGLE SET OF BLOOD CULTURES WHEN MULTIPLE SETS ARE DRAWN IS UNCERTAIN. PLEASE NOTIFY THE MICROBIOLOGY DEPARTMENT WITHIN ONE WEEK IF SPECIATION AND SENSITIVITIES ARE REQUIRED. Performed at [facility], [address] [place], [place]    Report Status 12/12/2020 FINAL  Final  Urine culture     Status: Abnormal   Collection Time: 12/08/20  1:51 PM   Specimen: In/Out Cath Urine  Result Value Ref Range Status   Specimen Description   Final    IN/OUT CATH URINE Performed at [facility], [address], [place], [place]    Special Requests   Final    NONE Performed at [facility], [address], [place], [place]    Culture MULTIPLE SPECIES PRESENT, SUGGEST RECOLLECTION (A)  Final   Report Status 12/09/2020 FINAL  Final  Blood Culture ID Panel (Reflexed)     Status: Abnormal   Collection Time: 12/08/20  1:51 PM  Result Value Ref Range Status   Enterococcus faecalis NOT DETECTED NOT DETECTED Final   Enterococcus Faecium NOT DETECTED NOT DETECTED Final  Listeria monocytogenes NOT DETECTED NOT DETECTED Final   Staphylococcus species  DETECTED (A) NOT DETECTED Final    Comment: CRITICAL RESULT CALLED TO, READ BACK BY AND VERIFIED WITH: PHARMD ELLEN JACKSON BY MESSAN H. AT 0315 ON 12/11/2020    Staphylococcus aureus (BCID) NOT DETECTED NOT DETECTED Final   Staphylococcus epidermidis DETECTED (A) NOT DETECTED Final    Comment: Methicillin (oxacillin) resistant coagulase negative staphylococcus. Possible blood culture contaminant (unless isolated from more than one blood culture draw or clinical case suggests pathogenicity). No antibiotic treatment is indicated for blood  culture contaminants.    Staphylococcus lugdunensis NOT DETECTED NOT DETECTED Final   Streptococcus species NOT DETECTED NOT DETECTED Final   Streptococcus agalactiae NOT DETECTED NOT DETECTED Final   Streptococcus pneumoniae NOT DETECTED NOT DETECTED Final   Streptococcus pyogenes NOT DETECTED NOT DETECTED Final   A.calcoaceticus-baumannii NOT DETECTED NOT DETECTED Final   Bacteroides fragilis NOT DETECTED NOT DETECTED Final   Enterobacterales NOT DETECTED NOT DETECTED Final   Enterobacter cloacae complex NOT DETECTED NOT DETECTED Final   Escherichia coli NOT DETECTED NOT DETECTED Final   Klebsiella aerogenes NOT DETECTED NOT DETECTED Final   Klebsiella oxytoca NOT DETECTED NOT DETECTED Final   Klebsiella pneumoniae NOT DETECTED NOT DETECTED Final   Proteus species NOT DETECTED NOT DETECTED Final   Salmonella species NOT DETECTED NOT DETECTED Final   Serratia marcescens NOT DETECTED NOT DETECTED Final   Haemophilus influenzae NOT DETECTED NOT DETECTED Final   Neisseria meningitidis NOT DETECTED NOT DETECTED Final   Pseudomonas aeruginosa NOT DETECTED NOT DETECTED Final   Stenotrophomonas maltophilia NOT DETECTED NOT DETECTED Final   Candida albicans NOT DETECTED NOT DETECTED Final   Candida auris NOT DETECTED NOT DETECTED Final   Candida glabrata NOT DETECTED NOT DETECTED Final   Candida krusei NOT DETECTED NOT DETECTED Final   Candida parapsilosis  NOT DETECTED NOT DETECTED Final   Candida tropicalis NOT DETECTED NOT DETECTED Final   Cryptococcus neoformans/gattii NOT DETECTED NOT DETECTED Final   Methicillin resistance mecA/C DETECTED (A) NOT DETECTED Final    Comment: CRITICAL RESULT CALLED TO, READ BACK BY AND VERIFIED WITH: PHARMD ELLEN JACKSON BY MESSAN H. AT 0315 ON 12/11/2020 Performed at [facility], [address] [place], [place] [address]   Resp Panel by RT-PCR (Flu A&B, Covid) Nasopharyngeal Swab     Status: None   Collection Time: 12/08/20  2:21 PM   Specimen: Nasopharyngeal Swab; Nasopharyngeal(NP) swabs in vial transport medium  Result Value Ref Range Status   SARS Coronavirus 2 by RT PCR NEGATIVE NEGATIVE Final    Comment: (NOTE) SARS-CoV-2 target nucleic acids are NOT DETECTED.  The SARS-CoV-2 RNA is generally detectable in upper respiratory specimens during the acute phase of infection. The lowest concentration of SARS-CoV-2 viral copies this assay can detect is 138 copies/mL. A negative result does not preclude SARS-Cov-2 infection and should not be used as the sole basis for treatment or other patient management decisions. A negative result may occur with  improper specimen collection/handling, submission of specimen other than nasopharyngeal swab, presence of viral mutation(s) within the areas targeted by this assay, and inadequate number of viral copies(<138 copies/mL). A negative result must be combined with clinical observations, patient history, and epidemiological information. The expected result is Negative.  Fact Sheet for Patients:  BloggerCourse.com  Fact Sheet for Healthcare Providers:  SeriousBroker.it  This test is no t yet approved or cleared by the [place] and  has been authorized for  detection and/or diagnosis of SARS-CoV-2 by FDA under an Emergency Use Authorization (EUA). This EUA will remain  in effect (meaning this  test can be used) for the duration of the COVID-19 declaration under Section 564(b)(1) of the Act, 21 U.S.C.section 360bbb-3(b)(1), unless the authorization is terminated  or revoked sooner.       Influenza A by PCR NEGATIVE NEGATIVE Final   Influenza B by PCR NEGATIVE NEGATIVE Final    Comment: (NOTE) The Xpert Xpress SARS-CoV-2/FLU/RSV plus assay is intended as an aid in the diagnosis of influenza from Nasopharyngeal swab specimens and should not be used as a sole basis for treatment. Nasal washings and aspirates are unacceptable for Xpert Xpress SARS-CoV-2/FLU/RSV testing.  Fact Sheet for Patients: BloggerCourse.com  Fact Sheet for Healthcare Providers: SeriousBroker.it  This test is not yet approved or cleared by the [place] FDA and has been authorized for detection and/or diagnosis of SARS-CoV-2 by FDA under an Emergency Use Authorization (EUA). This EUA will remain in effect (meaning this test can be used) for the duration of the COVID-19 declaration under Section 564(b)(1) of the Act, 21 U.S.C. section 360bbb-3(b)(1), unless the authorization is terminated or revoked.  Performed at [facility], [address], [place], [place] [address]   Blood Culture (routine x 2)     Status: None   Collection Time: 12/08/20  2:22 PM   Specimen: BLOOD RIGHT FOREARM  Result Value Ref Range Status   Specimen Description   Final    BLOOD RIGHT FOREARM Performed at [facility], [address] [place], [place] [address]    Special Requests   Final    BOTTLES DRAWN AEROBIC AND ANAEROBIC Blood Culture results may not be optimal due to an excessive volume of blood received in culture bottles Performed at [facility], [address], [place], [place] [address]    Culture   Final    NO GROWTH 5 DAYS Performed at [facility], [address] [place], [place]  [address]    Report Status 12/13/2020 FINAL  Final  Culture, blood (routine x 2)     Status: None (Preliminary result)   Collection Time: 12/12/20 10:46 AM   Specimen: BLOOD  Result Value Ref Range Status   Specimen Description   Final    BLOOD PICC LINE Performed at [facility], [address], [place], [place] [address]    Special Requests   Final    BOTTLES DRAWN AEROBIC AND ANAEROBIC Blood Culture adequate volume Performed at [facility], [address], [place], [place] [address]    Culture   Final    NO GROWTH 3 DAYS Performed at [facility], [address] [place], [place] [address]    Report Status PENDING  Incomplete  Culture, blood (routine x 2)     Status: None (Preliminary result)   Collection Time: 12/12/20 10:46 AM   Specimen: BLOOD  Result Value Ref Range Status   Specimen Description   Final    BLOOD BLOOD RIGHT HAND Performed at [facility], [address] [place], [place] [address]    Special Requests   Final    BOTTLES DRAWN AEROBIC AND ANAEROBIC Blood Culture adequate volume Performed at [facility], [address], [place], [place] [address]    Culture   Final    NO GROWTH 3 DAYS Performed at [facility], [address] [place],  [place] [id number]    Report Status PENDING  Incomplete   Pertinent Lab seen by me: CBC Latest Ref Rng & Units 12/13/2020 12/12/2020 12/11/2020  WBC 4.0 - 10.5 K/uL 18.5(H) 20.6(H) 39.7(H)  Hemoglobin 13.0 - 17.0 g/dL 6.0(A8.7(L) 7.4(L) 8.0(L)  Hematocrit 39.0 - 52.0 % 27.6(L) 23.9(L) 25.5(L)  Platelets 150 - 400 K/uL 102(L) 75(L) 79(L)   CMP Latest Ref Rng & Units 12/14/2020 12/13/2020 12/12/2020  Glucose 70 - 99 mg/dL 540(J142(H) 811(B110(H) 147(W168(H)  BUN 8 - 23 mg/dL 29(F31(H) 62(Z32(H) 30(Q26(H)  Creatinine 0.61 - 1.24 mg/dL 6.57(Q0.45(L) 4.69(G0.42(L) <2.95(M<0.30(L)  Sodium 135 - 145 mmol/L 141 142 131(L)  Potassium 3.5 - 5.1 mmol/L 3.7 3.7 2.8(L)  Chloride 98 - 111 mmol/L 104  104 97(L)  CO2 22 - 32 mmol/L 32 33(H) 28  Calcium 8.9 - 10.3 mg/dL 7.9(L) 8.3(L) 8.1(L)  Total Protein 6.5 - 8.1 g/dL - 6.7 6.2(L)  Total Bilirubin 0.3 - 1.2 mg/dL - 1.0 0.9  Alkaline Phos 38 - 126 U/L - 166(H) 173(H)  AST 15 - 41 U/L - 40 57(H)  ALT 0 - 44 U/L - 40 46(H)     Pertinent Imagings/Other Imagings Plain films and CT images have been personally visualized and interpreted; radiology reports have been reviewed. Decision making incorporated into the Impression / Recommendations.  CT abdomen/pelvis 12/08/20 IMPRESSION: 1. No pulmonary embolus. 2. Right lower lobe airspace disease, concerning for pneumonia. 3. Small bilateral pleural effusions and associated atelectasis. 4. Stable bilateral nonobstructing nephrolithiasis. 5. Cholelithiasis without evidence for cholecystitis. 6. 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 7. Degenerative changes of the lumbar spine are most pronounced at L1-2 and L4-5. 8. Aortic Atherosclerosis (ICD10-I70.0).  I have spent more than 70  minutes for this patient encounter including review of prior medical records with greater than 50% of time being face to face and coordination of their care.  Electronically signed by:   Odette FractionSabina Rhonda Vangieson, MD Infectious Disease Physician [facility]  [facility] Pager: [phone]

## 2020-12-15 NOTE — Progress Notes (Addendum)
[facility]  [facility]) [facility] Liaison Note:   Patient is a current hospice patient with [facility]. Admitted to Hospice services on 10/27/20 with a terminal diagnosis of abnormal weight loss. On 12/08/2020, patient was found by staff at [facility] unresponsive, and hypotensive with SBPs 60s. Transported by EMS to [facility] ED per daughter's request. On arrival to the ED patient remained in shock and was started on NE infusion via PICC line and started on sepsis protocol receiving vancomycin and cefepime. Per Dr. Kirt Boys, [facility] MD, this is a related admission.   Visited patient at bedside. No visitors present. Patient was awake and alert. He denied pain or discomfort and did not appear to be in distress. Patient states he has not had any visitors today but expects his daughter later today. Received report from RN.  This pt continues to be appropriate for inpatient services due to the need for IV medications for infection and electrolyte replacement.  VS: 98.1, 133/56, 100, 21, 95% 2Lnc I/O: 1449/3025 No new labs or diagnostics today  IV and PRN medications: Levophed titrated for BP, vancomycin 1000mg  IVPB QD, Merrem 1gm IVPB TID, potassium PHOSPHATE 30 mmol in dextrose 5 % 500 mL infusion   Assessment & Plan:    Quadriplegia Respiratory insufficiency -Remains on oxygen supplementation -Poor candidate for mechanical ventilation should he decline -Daughter was the POA desires aggressive measures if needed   Encephalopathy -This appears to be stabilizing -Overall mentation appears to be improving -Remains profoundly weak   Septic shock-present on admission Source of infection appears to be chronic sacral wound with associated osteomyelitis, possible right lower lobe pneumonia Has a chronic PICC-placed for osteomyelitis Sacral osteomyelitis April 2022-antibiotics were held May 2022 as patient was discharged to hospice status at the time -Still requiring pressors -Midodrine  ordered -Continue antibiotics-including meropenem and vancomycin -Continue wound care -Follow-up on repeat blood cultures performed 6/21-negative to date -Chronic Foley-changed 6/21  History of diastolic dysfunction on echocardiogram May 2022 Concern for NSTEMI -Has remained hemodynamically stable, did not need anticoagulation  Chronic indwelling Foley History of nephrolithiasis -Foley was replaced 6/21   Electrolyte derangement including hypophosphatemia, hypomagnesemia -Being replaced  Severe protein calorie malnutrition Chronic PEG -Continue tube feeds   Chronic anemia due to chronic illness Acute on chronic anemia due to critical illness, septic shock Mild thrombocytopenia-likely related to sepsis -Continue to trend -On subcu heparin for DVT prophylaxis -Trend labs  Discharge Planning: Back to facility when medically optimized   Family Contact: Left a HIPAA-compliant voicemail for daughter 7/21.  No response at this time.   IDT: Hospice team updated   Goals of Care: Full code; daughter continues to desire all measures taken to treat any and all conditions.    At time of discharge please use [facility] for transport.    Please do not hesitate to call with questions.   Thank you,   [name], RN, [facility]      [facility] Liaison   [phone]

## 2020-12-15 NOTE — Progress Notes (Signed)
  Speech Language Pathology Treatment: Dysphagia  Patient Details Name: Shannon [name] MRN: [id number] DOB: 10/25/1945 Today's Date: 12/15/2020 Time: 5465-6812 SLP Time Calculation (min) (ACUTE ONLY): 19 min  Assessment / Plan / Recommendation Clinical Impression  Pt continues to present with concerns for an acute dysphagia and deteriorated ability to protect airway with POs.  Demonstrated consistent and immediate coughing with thin liquids, regardless of bolus size/delivery method (straw/spoon/cup).  Pt tended to extend neck and cues to reposition head did not offer improved outcomes.  Pudding was tolerated without s/s of aspiration. Pt continues to swallow up to seven times with each teaspoon bolus, suggesting poor bolus transition either pharyngeally or within esophagus.  Recommend continuing to allow applesauce and pudding from floor stock, but no further liquids given likelihood of aspiration.  Chart review indicates hospice involvement AND full code status.  Swallowing ability has really waxed and waned.  Will need to determine how Mr. Doo wants to address oral nutrition during this admission. D/W RN.      HPI HPI: 75 yo male adm to [facility] with AMS - septic, hypotensive.  Prior to admit, pt is being followed by hospice and remains a full code.  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  Pt with dysphagia diagnosed post=ACDF via several MBS studies showing silent aspiration - 11/2 and 11/12.  Most recent exam 06/05/2020 showed improvement with swallowing with recommendation to advance to regular/thin.  He received a a PEG tube 06/2020 due to malnutrition.  Pt reports he eats "whatever he wants" at his SNF.  He does endorse increased mucus/coughing not always associated with po intake. At this time, pt is receiving tube feeding  for nutrition and otherwise is NPO.      SLP Plan  Continue with current plan of care Consider MBS next week if results would help with decision-making       Recommendations  Diet recommendations: NPO (except purees from floor stock) Medication Administration: Via GTUBE Supervision: Trained caregiver to feed patient Postural Changes and/or Swallow Maneuvers: Seated upright 90 degrees                Oral Care Recommendations: Oral care QID Follow up Recommendations: Other (comment) SLP Visit Diagnosis: Dysphagia, oropharyngeal phase (R13.12) Plan: Continue with current plan of care       GO               Dysen Edmondson L. Samson Frederic, MA CCC/SLP Acute Rehabilitation Services Office number [phone] Pager [phone]  Carolan Shiver 12/15/2020, 3:32 PM

## 2020-12-15 NOTE — Progress Notes (Signed)
Pharmacy Electrolyte Replacement  Recent Labs:  Recent Labs    12/14/20 0255  K 3.7  MG 1.7  PHOS 1.9*  CREATININE 0.45*    Low Critical Values (K </= 2.5, Phos </= 1, Mg </= 1) Present: Phos = 1.9  MD Contacted: Dr Wynona Neat  Plan: KPhos 30 mmol IV x1 dose (Also provides ~ 44 mEq K+) Recheck Phos with AM labs.    Lynann Beaver PharmD, BCPS Clinical Pharmacist [facility] main pharmacy [phone] 12/15/2020 9:00 AM

## 2020-12-16 LAB — BASIC METABOLIC PANEL
Anion gap: 4 — ABNORMAL LOW (ref 5–15)
BUN: 33 mg/dL — ABNORMAL HIGH (ref 8–23)
CO2: 28 mmol/L (ref 22–32)
Calcium: 7.6 mg/dL — ABNORMAL LOW (ref 8.9–10.3)
Chloride: 105 mmol/L (ref 98–111)
Creatinine, Ser: 0.49 mg/dL — ABNORMAL LOW (ref 0.61–1.24)
GFR, Estimated: >60 mL/min (ref >60)
Glucose, Bld: 124 mg/dL — ABNORMAL HIGH (ref 70–99)
Potassium: 4.4 mmol/L (ref 3.5–5.1)
Sodium: 137 mmol/L (ref 135–145)

## 2020-12-16 LAB — CBC
HCT: 25.4 % — ABNORMAL LOW (ref 39.0–52.0)
Hemoglobin: 7.8 g/dL — ABNORMAL LOW (ref 13.0–17.0)
MCH: 25.6 pg — ABNORMAL LOW (ref 26.0–34.0)
MCHC: 30.7 g/dL (ref 30.0–36.0)
MCV: 83.3 fL (ref 80.0–100.0)
Platelets: 217 10*3/uL (ref 150–400)
RBC: 3.05 MIL/uL — ABNORMAL LOW (ref 4.22–5.81)
RDW: 17.8 % — ABNORMAL HIGH (ref 11.5–15.5)
WBC: 13.7 10*3/uL — ABNORMAL HIGH (ref 4.0–10.5)
nRBC: 0 % (ref 0.0–0.2)

## 2020-12-16 LAB — GLUCOSE, CAPILLARY
Glucose-Capillary: 110 mg/dL — ABNORMAL HIGH (ref 70–99)
Glucose-Capillary: 124 mg/dL — ABNORMAL HIGH (ref 70–99)
Glucose-Capillary: 110 mg/dL — ABNORMAL HIGH (ref 70–99)
Glucose-Capillary: 112 mg/dL — ABNORMAL HIGH (ref 70–99)
Glucose-Capillary: 122 mg/dL — ABNORMAL HIGH (ref 70–99)
Glucose-Capillary: 108 mg/dL — ABNORMAL HIGH (ref 70–99)

## 2020-12-16 LAB — PHOSPHORUS: Phosphorus: 2.9 mg/dL (ref 2.5–4.6)

## 2020-12-16 NOTE — Plan of Care (Signed)
  Problem: Nutrition: Goal: Adequate nutrition will be maintained Outcome: Progressing  TF at goal 60 ml/hr

## 2020-12-16 NOTE — Progress Notes (Signed)
Pharmacy Antibiotic Note  Shannon [name] is a 75 y.o. male admitted on 12/08/2020 with septic shock, history of sacral osteomyelitis.  Pharmacy has been consulted for cefepime and vancomycin dosing. Today is day #9 broad spectrum antibiotics. Note, ID consult recommends treatment for 14 days.    Plan: Continue Meropenem 1gm IV q8h Continue Vancomycin 1g IV q24h.  Expected AUC 456. Measure Vanc peak and trough at steady state.  Goal AUC = 400 - 550. Follow up renal function, culture results, and clinical course.   Height: 5\' 7"  (170.2 cm) Weight: 93.3 kg (205 lb 11 oz) IBW/kg (Calculated) : 66.1  Temp (24hrs), Avg:98.6 F (37 C), Min:98.1 F (36.7 C), Max:99 F (37.2 C)  Recent Labs  Lab 12/09/20 1423 12/10/20 0335 12/10/20 0335 12/11/20 0329 12/12/20 0500 12/13/20 0357 12/13/20 0916 12/13/20 1217 12/14/20 0255 12/16/20 0325  WBC  --  47.2*  --  39.7* 20.6* 18.5*  --   --   --  13.7*  CREATININE  --  0.72   < > 0.58* <0.30* 0.42*  --   --  0.45* 0.49*  LATICACIDVEN 2.8* 2.1*  --   --   --   --   --   --   --   --   VANCOTROUGH  --   --   --   --   --   --  15  --   --   --   VANCOPEAK  --   --   --   --   --   --   --  34  --   --    < > = values in this interval not displayed.     Estimated Creatinine Clearance: 88.2 mL/min (A) (by C-G formula based on SCr of 0.49 mg/dL (L)).   Estimated creatinine clearance 41 ml/min  Allergies  Allergen Reactions   Iodine Swelling    NOT CT CONTRAST   Penicillins Swelling    ** tolerates cephalosporins Facial swelling, itchy throat    Antimicrobials this admission: 6/17 Cefepime >> 6/18 6/17 Vanc >> 6/18 Meropenem >>  Dose adjustments this admission: 6/18 Vanc 750mg  q24 > 1250mg  q24 6/22 0916 VT 15, 1217 VP 34.  Calculated AUC 570.  Change to Vanc 1g IV q24h for AUC 456 (Cmin 11.7, Cmax 28.7)  Microbiology results: 6/17 BCx: 1/4 staph epi, MR detected 6/17 UCx: mult sp-final 11/13/20 MRSA PCR: positive 6/21 BCx:  ngtd  Thank you for allowing pharmacy to be a part of this patient's care. 7/17 PharmD, BCPS Clinical Pharmacist [facility] main pharmacy [phone] 12/16/2020 8:39 AM

## 2020-12-16 NOTE — Progress Notes (Signed)
NAME:  Shannon [name], MRN:  [id number], DOB:  07/24/1945, LOS: 8 ADMISSION DATE:  12/08/2020, CONSULTATION DATE:  6/17 REFERRING MD:  Joya Gaskins - EM CHIEF COMPLAINT:  Unresponsive, shock   BRIEF  75 yo PMH quadriplegia, neurogenic bladder, sacral osteomyelitis and stage IV sacral decubitus ulcer , bed bound,  Long  term resident of  [facility], severe protein calorie malnutrition. Hospice at [facility] was initiated early May 2022. Then, with hematuria and hemorrhagic shock  due to traumatic foley at [facility] requiring admission/cysto/blood clot evac/fulguration. Admitted 5/22 - 11/18/20  and discharged back to [place]. Due to hospice status abx for osteo were stopped (vanc and ertapenem) continued receiving hospice care though as full code. He then presented to ED 12/08/20 after being found unresponsive at care facility. Usual state of health 6/16. Then on 12/08/2020 found by staff unresponsive, and hypotensive SBPs 60s on EMS arrival which did not improve with IVF and was started on an epi infusion by EMS. On arrival to the ED patient remained in shock, was started on NE infusion via PICC line (chronic LUE - present on admission) and started on sepsis protocol receiving vanc and cefepime   LA 8.1 WBC 26 Cr 1.61 (baseline 0.4) AG 14 (prior <5) alk phos 226 AST 229 ALT 99 Trop 3292 Ddimer >20,000 . CXR with L PICC, small R pleural effusion . CT chest with RLL pneumonia (aspn v HCA); he does not have trach. Started on pressors and s/p 3L fluid by ER at time of CCM eval 12/08/2020 and more responsive. No hx of bleeing this admit. 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   has a past surgical history that includes Rotator cuff repair (Right); Spine surgery; Incision and drainage (Left, 2012); Back surgery; Anterior cervical decomp/discectomy fusion (N/A, 04/05/2020); Anterior cervical decomp/discectomy fusion (N/A, 04/22/2020); IR GASTROSTOMY TUBE MOD SED (07/05/2020); and Cystoscopy (N/A, 11/13/2020).   MICRO  Blood culture 6/17 with staph epidermidis Urine culture 6/17-multiple organisms, suggest reculture  Significant Hospital Events: Including procedures, antibiotic start and stop dates in addition to other pertinent events   6/17 unresponsive at [facility]. On EMS arrival, SBPs 60-- refractory to Ivf and started on epi. Changed to NE in ED. Started on vanc, cefepime. Is a hospice pt but family is contemplating revoking this.  6/17 - seen in [place] and follows some commands. Weak flicker movement on uppers. PRotecting airway. CTA bilarlly. Abd soft. Normal heart sounds 6/18 - -more awake and he nods and tries to follow commands.  On 15 L nonrebreather mask.  He is on Levophed 26 mcg via the PICC line is on D5 LR.  Cultures with no growth.  Febrile 101.  White count is 40 9.4K on Vanco and cefepime.  Procalcitonin is greater than 150.  Lactic acid is still persistently high without clearance lactate greater than 7.  Hospice nurse visited him at the bedside and explained that end of May 2022 when patient was admitted for his traumatic Foley hematuria and he was discharged.  His  antibiotics for sacral osteomyelitis were held because of his hospice status [he is hospice and concomitantly full code].  At this point in time he continues to have his hospice benefits though is full code. Blood cx 6/17 >>  GPC clusters > MRSE (per BCID >  6/24 levo was restarted  Interim History / Subjective:   No overnight events Off  pressors  Objective   Blood pressure (!) 107/51, pulse 91, temperature 97.6 F (36.4 C), temperature source Axillary, resp. rate 19, height $RemoveBe'5\' 7"'YcjNlThTU$  (1.702 m), weight 93.3 kg, SpO2 97 %.        Intake/Output Summary (Last 24 hours) at 12/16/2020 1528 Last data filed at 12/16/2020 1152 Gross per 24 hour  Intake 2040.1 ml  Output 4225 ml  Net -2184.9 ml   Filed Weights   12/14/20 0500 12/15/20 0343 12/16/20 0341  Weight: 92.3 kg 94.4 kg 93.3 kg    Examination: Gen: Elderly gentleman, comfortable, does not appear to be in pain or discomfort  HEENT: Moist oral mucosa Neck: No JVD no thyromegaly Lungs: Decreased breath sounds bilaterally CV: S1-S2 appreciated Abd: PEG in place, bowel sounds appreciated Ext: Lower extremity edema bilaterally Skin: Posterior scalp pressure injury, sacral decubitus wound  neuro: Alert and interactive, following commands, weak   Labs/imaging that I have personally reviewed  (right click and "Reselect all SmartList Selections" daily)   Labs and studies reviewed 6/24 Hypophosphatemia Hypomagnesemia   Resolved Hospital Problem list   Lactic acidosis  Shock liver  Assessment & Plan:   Quadriplegia Respiratory insufficiency -Continue oxygen segmentation -Poor candidate for mechanical ventilation should he decline -Daughter still request for aggressive measures if needed  Encephalopathy -Mentation appears to be improving -Profoundly weak  Septic shock-present on admission Source of infection appears to be chronic sacral wound with associated osteomyelitis, possible right lower lobe pneumonia Has a chronic PICC-placed for osteomyelitis Sacral osteomyelitis April 2022-antibiotics were held May 2022 as patient was discharged to hospice status at the time -Off IV pressors -Tolerating midodrine -To continue antibiotics -Appreciate infectious disease input  History of diastolic dysfunction on echocardiogram May 2022 Concern for NSTEMI -Has  remained hemodynamically stable, did not need anticoagulation  Chronic indwelling Foley History of nephrolithiasis -Foley was replaced 6/21  Electrolyte derangement including hypophosphatemia, hypomagnesemia -Being replaced  Severe protein calorie malnutrition Chronic PEG -Continue tube feeds  Chronic anemia due to chronic illness Acute on chronic anemia due to critical illness, septic shock Mild thrombocytopenia-likely related to sepsis -Continue to trend -On subcu heparin for DVT prophylaxis -Trend labs  Appears to be stable today Risk of decompensation remains high as he does not have good reserves  Best Practice (right click and "Reselect all SmartList Selections" daily)   Diet/type: tubefeeds Pain/Anxiety/Delirium protocol Not indicated VAP protocol (if indicated): Not indicated DVT prophylaxis: prophylactic heparin restarted 6/21 GI prophylaxis: PPI Glucose control:  not indicated Central venous access:  Yes, and it is still needed - LUE PICC chronic Arterial line:  N/A Foley:  Yes, and it is still needed - chronic indwelling Mobility:  bed rest  PT consulted: N/A Studies pending: None Culture data pending:blood Last reviewed culture data:today Antibiotics:vanc and meropenem Antibiotic de-escalation: no,  continue current rx Stop date: to be determined  Daily labs: ordered Code Status:  full code  Last date of multidisciplinary goals of care discussion: 6/20 Dr Lamonte Sakai discussed status, plans and prognosis with [name] by phone 6/20: She is clear that she agrees with current aggressive care, does not want to  lose her father, is not interested in speaking with Hospice or in de-escalation.  I explained that we are supporting aggressively to get through this acute decompensation due to septic shock, but that there is likely no way to resolve the underlying cause, namely his decubitus ulcer and osteo - ultimately it as well as his debilitation from  paraplegia will take his life.  As pertains to her goal to get through this acute event, I assured her that were are being aggressive, and that I will commit to continuing all CC interventions that might have benefit.  I also explained that if there are life sustaining interventions from which I do not believe he can recover to his previous QOL, then I would recommend deferring. I will have to talk to her further about possible limitations - for example DNI status, no trach, etc. Will continue to update her and discuss.   Family: updated daughter on current status and plans 6/22   The patient is critically ill with multiple organ systems failure and requires high complexity decision making for assessment and support, frequent evaluation and titration of therapies, application of advanced monitoring technologies and extensive interpretation of multiple databases. Critical Care Time devoted to patient care services described in this note independent of APP/resident time (if applicable)  is 32 minutes.   Sherrilyn Rist MD [facility] Personal pager: [phone] If unanswered, please page CCM On-call: [phone]

## 2020-12-16 NOTE — Progress Notes (Signed)
[facility]  [facility]) [facility] Liaison Note:   Patient is a current hospice patient with [facility]. Admitted to Hospice services on 10/27/20 with a terminal diagnosis of abnormal weight loss. On 12/08/2020, patient was found by staff at [facility] unresponsive, and hypotensive with SBPs 60s. Transported by EMS to [facility] ED per daughter's request. On arrival to the ED patient remained in shock and was started on NE infusion via PICC line and started on sepsis protocol receiving vancomycin and cefepime. Per Dr. Kirt Boys, [facility] MD, this is a related admission.   Visited patient at bedside. No visitors present. Patient AO at baseline, resp e/u, cough appears to be at baseline.  Pt reports some symptoms of restlessness/anxiety,  spent some time exploring feelings around being asked by care team to make decisions around DNR/EOL care.  Played music, Pamala Duffel at pt's request, which pt found very enjoyable.  NT helped get this playing in the room, which was much appreciated.  Exchanged report with RN.  This pt continues to be appropriate for inpatient services due to the need for IV medications for infection and electrolyte replacement.  VS: 98.7 axillary, 103/60 (72), HR 102, RR 22, 97% 2L [place] I/O: 3072.02/4349 No new labs or diagnostics today  IV and PRN medications: Heparin 5000 units q8h [place],  NS@KVO , Merrem 1gm IVPB TID;  vancomycin 1000mg  dialy PIV   Assessment & Plan:    Quadriplegia Respiratory insufficiency -Remains on oxygen supplementation -Poor candidate for mechanical ventilation should he decline -Daughter still requesting aggressive measure if needed   Encephalopathy -Mentation appears to be improving -Remains profoundly weak   Septic shock-present on admission Source of infection appears to be chronic sacral wound with associated osteomyelitis, possible right lower lobe pneumonia Has a chronic PICC-placed for osteomyelitis Sacral osteomyelitis April 2022-antibiotics  were held May 2022 as patient was discharged to hospice status at the time -Off IV pressors -Tolerating midodrine -To continue antibiotics -Appreciate infectious disease input  History of diastolic dysfunction on echocardiogram May 2022 Concern for NSTEMI -Has remained hemodynamically stable, did not need anticoagulation  Chronic indwelling Foley History of nephrolithiasis -Foley was replaced 6/21   Electrolyte derangement including hypophosphatemia, hypomagnesemia -Being replaced  Severe protein calorie malnutrition Chronic PEG -Continue tube feeds   Chronic anemia due to chronic illness Acute on chronic anemia due to critical illness, septic shock Mild thrombocytopenia-likely related to sepsis -Continue to trend -On subcu heparin for DVT prophylaxis -Trend labs  Discharge Planning: Back to facility when medically optimized   Family Contact: Left a HIPAA-compliant voicemail for daughter 7/21.  No response at this time.   IDT: Hospice team updated   Goals of Care: Full code; daughter continues to desire all measures taken to treat any and all conditions.    At time of discharge please use [facility] for transport.    Please do not hesitate to call with questions.    Thank you for the opportunity to participate in this pt's care.   [name], BSN, RN [facility] Liaison (listed on [place] under Hospice/[facility])    [phone] (24h on call)

## 2020-12-16 NOTE — Progress Notes (Signed)
   12/16/20 2228  Assess: MEWS Score  Temp 99.9 F (37.7 C)  BP (!) 104/59  Pulse Rate (!) 111  Resp (!) 22  Level of Consciousness Alert  SpO2 97 %  Assess: MEWS Score  MEWS Temp 0  MEWS Systolic 0  MEWS Pulse 2  MEWS RR 1  MEWS LOC 0  MEWS Score 3  MEWS Score Color Yellow  Assess: if the MEWS score is Yellow or Red  Were vital signs taken at a resting state? Yes  Focused Assessment No change from prior assessment  Does the patient meet 2 or more of the SIRS criteria? Yes  Does the patient have a confirmed or suspected source of infection? Yes  Provider and Rapid Response Notified? No  MEWS guidelines implemented *See Row Information* No, previously yellow, continue vital signs every 4 hours (tx from ICU was red in ICU but is yellow now for the same reason)  Treat  Pain Scale 0-10  Pain Score 0  Take Vital Signs  Increase Vital Sign Frequency  Yellow: Q 2hr X 2 then Q 4hr X 2, if remains yellow, continue Q 4hrs  Escalate  MEWS: Escalate Yellow: discuss with charge nurse/RN and consider discussing with provider and RRT  Notify: Charge Nurse/RN  Name of Charge Nurse/RN Notified Hilda  Date Charge Nurse/RN Notified 12/16/20  Time Charge Nurse/RN Notified 2230  Notify: Provider  Provider Name/Title NA  Notify: Rapid Response  Name of Rapid Response RN Notified NA  Document  Patient Outcome Stabilized after interventions (tx from ICU no changes since)  Progress note created (see row info) Yes  Assess: SIRS CRITERIA  SIRS Temperature  0  SIRS Pulse 1  SIRS Respirations  1  SIRS WBC 0  SIRS Score Sum  2  Patient is a transfer from ICU, was in the red MEWS in ICU but became yellow here for the same reason. Patient is comfortable, AxO x4, will continue yellow MEWS protocols

## 2020-12-17 LAB — GLUCOSE, CAPILLARY
Glucose-Capillary: 123 mg/dL — ABNORMAL HIGH (ref 70–99)
Glucose-Capillary: 116 mg/dL — ABNORMAL HIGH (ref 70–99)
Glucose-Capillary: 124 mg/dL — ABNORMAL HIGH (ref 70–99)
Glucose-Capillary: 105 mg/dL — ABNORMAL HIGH (ref 70–99)
Glucose-Capillary: 119 mg/dL — ABNORMAL HIGH (ref 70–99)

## 2020-12-17 LAB — CULTURE, BLOOD (ROUTINE X 2)
Culture: NO GROWTH
Culture: NO GROWTH
Special Requests: ADEQUATE
Special Requests: ADEQUATE

## 2020-12-17 LAB — VANCOMYCIN, PEAK: Vancomycin Pk: 27 ug/mL — ABNORMAL LOW (ref 30–40)

## 2020-12-17 NOTE — Progress Notes (Signed)
NAME:  Shannon [name], MRN:  [id number], DOB:  [birth year], LOS: 9 ADMISSION DATE:  12/08/2020, CONSULTATION DATE:  6/17 REFERRING MD:  Joya Gaskins - EM CHIEF COMPLAINT:  Unresponsive, shock   BRIEF  75 yo PMH quadriplegia, neurogenic bladder, sacral osteomyelitis and stage IV sacral decubitus ulcer , bed bound,  Long  term resident of  [facility], severe protein calorie malnutrition. Hospice at [facility] was initiated early May 2022. Then, with hematuria and hemorrhagic shock  due to traumatic foley at [facility] requiring admission/cysto/blood clot evac/fulguration. Admitted 5/22 - 11/18/20  and discharged back to [facility]. Due to hospice status abx for osteo were stopped (vanc and ertapenem) continued receiving hospice care though as full code. He then presented to ED 12/08/20 after being found unresponsive at care facility. Usual state of health 6/16. Then on 12/08/2020 found by staff unresponsive, and hypotensive SBPs 60s on EMS arrival which did not improve with IVF and was started on an epi infusion by EMS. On arrival to the ED patient remained in shock, was started on NE infusion via PICC line (chronic LUE - present on admission) and started on sepsis protocol receiving vanc and cefepime   LA 8.1 WBC 26 Cr 1.61 (baseline 0.4) AG 14 (prior <5) alk phos 226 AST 229 ALT 99 Trop 3292 Ddimer >20,000 . CXR with L PICC, small R pleural effusion . CT chest with RLL pneumonia (aspn v HCA); he does not have trach. Started on pressors and s/p 3L fluid by ER at time of CCM eval 12/08/2020 and more responsive. No hx of bleeing this admit. 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   has a past surgical history that includes Rotator cuff repair (Right); Spine surgery; Incision and drainage (Left, 2012); Back surgery; Anterior cervical decomp/discectomy fusion (N/A, 04/05/2020); Anterior cervical decomp/discectomy fusion (N/A, 04/22/2020); IR GASTROSTOMY TUBE MOD SED (07/05/2020); and Cystoscopy (N/A, 11/13/2020).   MICRO  Blood culture 6/17 with staph epidermidis Urine culture 6/17-multiple organisms, suggest reculture  Significant Hospital Events: Including procedures, antibiotic start and stop dates in addition to other pertinent events   6/17 unresponsive at [facility]. On EMS arrival, SBPs 60-- refractory to Ivf and started on epi. Changed to NE in ED. Started on vanc, cefepime. Is a hospice pt but family is contemplating revoking this.  6/17 - seen in [place] and follows some commands. Weak flicker movement on uppers. PRotecting airway. CTA bilarlly. Abd soft. Normal heart sounds 6/18 - -more awake and he nods and tries to follow commands.  On 15 L nonrebreather mask.  He is on Levophed 26 mcg via the PICC line is on D5 LR.  Cultures with no growth.  Febrile 101.  White count is 40 9.4K on Vanco and cefepime.  Procalcitonin is greater than 150.  Lactic acid is still persistently high without clearance lactate greater than 7.  Hospice nurse visited him at the bedside and explained that end of May 2022 when patient was admitted for his traumatic Foley hematuria and he was discharged.  His  antibiotics for sacral osteomyelitis were held because of his hospice status [he is hospice and concomitantly full code].  At this point in time he continues to have his hospice benefits though is full code. Blood cx 6/17 >>  GPC clusters > MRSE (per BCID >  6/24 levo was restarted 6/25 transferred to medical floor  Interim History / Subjective:    Uneventful overnight Off pressors Awake and interactive, offers no complaints  Objective   Blood pressure (!) 91/52, pulse (!) 101, temperature 98.2 F (36.8 C), temperature source Oral, resp. rate (!) 22, height 5' 7"  (1.702 m), weight 92.1 kg, SpO2 96 %.        Intake/Output Summary (Last 24 hours) at 12/17/2020 0839 Last data filed at 12/17/2020 0404 Gross per 24 hour  Intake 120 ml  Output 3200 ml  Net -3080 ml   Filed Weights   12/15/20 0343 12/16/20 0341 12/17/20 0359  Weight: 94.4 kg 93.3 kg 92.1 kg    Examination: Gen: Elderly gentleman, comfortable, does not appear to be in pain or discomfort HEENT: Moist oral mucosa Lungs: Decreased breath sounds bilaterally CV: S1-S2 appreciated Abd: PEG in place, bowel sounds appreciated Ext: Lower extremity edema bilaterally Skin: Posterior scalp pressure injury, sacral decubitus wound  neuro: Alert and interactive, following commands, weak  Flexi-Seal placed for diarrhea, patient has a sacral decub  Labs/imaging that I have personally reviewed  (right click and "Reselect all SmartList Selections" daily)   Labs and studies reviewed 6/24 Hypophosphatemia Hypomagnesemia   Resolved Hospital Problem list   Lactic acidosis  Shock liver  Assessment & Plan:   Quadriplegia Respiratory sufficiency -Continue oxygen supplementation -Will be a "poor candidate for mechanical ventilation should he decline -Plan is to continue aggressive measures at present  Metabolic encephalopathy -Appears to be improving -Remains profoundly weak  Septic shock-present on admission Source of infection appears to be chronic sacral wound with associated osteomyelitis, possible right lower lobe pneumonia Has a chronic PICC-placed for osteomyelitis-removed on 12/15/2020 Sacral osteomyelitis April 2022-antibiotics were held May 2022 as patient was discharged to hospice status at the time -Tolerating midodrine -Appreciate ID  input -Recommendation is for 14 days of antibiotics-12/17/2020 will be day 9 of antibiotics  History of diastolic dysfunction on echo on May 2022 Concern for non-ST elevation MI -Has remained hemodynamically stable -Not on anticoagulation  Chronic indwelling Foley History of nephrolithiasis -Foley replaced 6/21  Severe protein calorie malnutrition Chronic PEG -Continue tube feeds  Chronic anemia due to chronic illness Acute on chronic anemia due to critical illness, septic shock Mild thrombocytopenia-stable  Appears to be stable today Transfer to medical floor, does not require pressors Transfer to hospitalist service today  Best Practice (right click and "Reselect all SmartList Selections" daily)   Diet/type: tubefeeds Pain/Anxiety/Delirium protocol Not indicated VAP protocol (if indicated): Not indicated DVT prophylaxis: prophylactic heparin restarted 6/21 GI prophylaxis: PPI Glucose control:  not indicated Central venous access:  Yes, and it is still needed - LUE PICC chronic Arterial line:  N/A Foley:  Yes, and it is still needed - chronic indwelling Mobility:  bed rest  PT consulted: N/A Studies pending: None Culture data pending:blood Last reviewed culture data:today Antibiotics:vanc and meropenem Antibiotic de-escalation: no,  continue current rx Stop date: to be determined  Daily labs: ordered Code Status:  full code  Last date of multidisciplinary goals of care discussion: 6/20 Dr Lamonte Sakai discussed status, plans and prognosis with [name] by phone 6/20: She is clear that she agrees with  current aggressive care, does not want to lose her father, is not interested in speaking with Hospice or in de-escalation.  I explained that we are supporting aggressively to get through this acute decompensation due to septic shock, but that there is likely no way to resolve the underlying cause, namely his decubitus ulcer and osteo - ultimately it as well as his  debilitation from paraplegia will take his life.  As pertains to her goal to get through this acute event, I assured her that were are being aggressive, and that I will commit to continuing all CC interventions that might have benefit.  I also explained that if there are life sustaining interventions from which I do not believe he can recover to his previous QOL, then I would recommend deferring. I will have to talk to her further about possible limitations - for example DNI status, no trach, etc. Will continue to update her and discuss.   Family: updated daughter on current status and plans 6/22  Sherrilyn Rist, MD [place] [facility] Pager: [phone]

## 2020-12-17 NOTE — Progress Notes (Addendum)
[facility] [address] [facility]) [facility] Liaison Note:   Patient is a current hospice patient with [facility]. Admitted to Hospice services on 10/27/20 with a terminal diagnosis of abnormal weight loss. On 12/08/2020, patient was found by staff at [facility] unresponsive, and hypotensive with SBPs 60s. Transported by EMS to [facility] ED per daughter's request. On arrival to the ED patient remained in shock and was started on NE infusion via PICC line and started on sepsis protocol receiving vancomycin and cefepime. Per Dr. Kirt Boys, [facility] MD, this is a related admission.   Visited patient at bedside. No visitors present. Patient sleeping soundly, did not rouse to name or to gentle touch.  Pt coughing more frequently; coughing did not interrupt sleep. Rectoseal inserted yesterday to aid wound healing.  This pt continues to be appropriate for inpatient services due to the need for IV medications for infection.  VS: 99.3 oral, 99/54 (67), HR 116, RR 20, 94% 2L [place] I/O: 120 (?question? Does not reflect tube feeds)/3425 No new labs or diagnostics today  IV and PRN medications: Heparin 5000 units q8h [place],  NS@KVO , Merrem 1gm IVPB TID;  vancomycin 1000mg  dialy PIV   Assessment & Plan:    Quadriplegia Respiratory insufficiency -Remains on oxygen supplementation -Poor candidate for mechanical ventilation should he decline -Plan is to continue aggressive measure at present   Encephalopathy -Mentation appears to be improving -Remains profoundly weak   Septic shock-present on admission Source of infection appears to be chronic sacral wound with associated osteomyelitis, possible right lower lobe pneumonia Has a chronic PICC-placed for osteomyelitis Sacral osteomyelitis April 2022-antibiotics were held May 2022 as patient was discharged to hospice status at the time -Tolerating midodrine -Appreciate ID input -Recommendation is for 14 days of antibiotics-12/17/2020 will be day 9 of  antibiotics  History of diastolic dysfunction on echocardiogram May 2022 Concern for NSTEMI -Has remained hemodynamically stable, did not need anticoagulation  Chronic indwelling Foley History of nephrolithiasis -Foley was replaced 6/21   Severe protein calorie malnutrition Chronic PEG -Continue tube feeds   Chronic anemia due to chronic illness Acute on chronic anemia due to critical illness, septic shock Mild thrombocytopenia- stable -Appears to be stable today Transfer to medical floor, does not require pressors Transfer to hospitalist service today   Discharge Planning: Back to facility when medically optimized   Family Contact: Left a HIPAA-compliant voicemail for daughter 7/21.  No response at this time.   IDT: Hospice team updated   Goals of Care: Full code; daughter continues to desire all measures taken to treat any and all conditions.    At time of discharge please use [facility] for transport.    Please do not hesitate to call with questions.    Thank you for the opportunity to participate in this pt's care.   [name], BSN, RN [facility] Liaison (listed on [place] under Hospice/[facility])    [phone] (24h on call)

## 2020-12-18 ENCOUNTER — Inpatient Hospital Stay (HOSPITAL_COMMUNITY)

## 2020-12-18 DIAGNOSIS — Z978 Presence of other specified devices: Secondary | ICD-10-CM

## 2020-12-18 LAB — BASIC METABOLIC PANEL
Anion gap: 5 (ref 5–15)
BUN: 33 mg/dL — ABNORMAL HIGH (ref 8–23)
CO2: 28 mmol/L (ref 22–32)
Calcium: 8.2 mg/dL — ABNORMAL LOW (ref 8.9–10.3)
Chloride: 108 mmol/L (ref 98–111)
Creatinine, Ser: 0.4 mg/dL — ABNORMAL LOW (ref 0.61–1.24)
GFR, Estimated: >60 mL/min (ref >60)
Glucose, Bld: 131 mg/dL — ABNORMAL HIGH (ref 70–99)
Potassium: 4.4 mmol/L (ref 3.5–5.1)
Sodium: 141 mmol/L (ref 135–145)

## 2020-12-18 LAB — CBC WITH DIFFERENTIAL/PLATELET
Abs Immature Granulocytes: 0.17 10*3/uL — ABNORMAL HIGH (ref 0.00–0.07)
Basophils Absolute: 0.1 10*3/uL (ref 0.0–0.1)
Basophils Relative: 1 %
Eosinophils Absolute: 0.7 10*3/uL — ABNORMAL HIGH (ref 0.0–0.5)
Eosinophils Relative: 9 %
HCT: 27.8 % — ABNORMAL LOW (ref 39.0–52.0)
Hemoglobin: 8.3 g/dL — ABNORMAL LOW (ref 13.0–17.0)
Immature Granulocytes: 2 %
Lymphocytes Relative: 16 %
Lymphs Abs: 1.2 10*3/uL (ref 0.7–4.0)
MCH: 25.1 pg — ABNORMAL LOW (ref 26.0–34.0)
MCHC: 29.9 g/dL — ABNORMAL LOW (ref 30.0–36.0)
MCV: 84 fL (ref 80.0–100.0)
Monocytes Absolute: 1 10*3/uL (ref 0.1–1.0)
Monocytes Relative: 13 %
Neutro Abs: 4.8 10*3/uL (ref 1.7–7.7)
Neutrophils Relative %: 59 %
Platelets: 337 10*3/uL (ref 150–400)
RBC: 3.31 MIL/uL — ABNORMAL LOW (ref 4.22–5.81)
RDW: 18 % — ABNORMAL HIGH (ref 11.5–15.5)
WBC: 8 10*3/uL (ref 4.0–10.5)
nRBC: 0 % (ref 0.0–0.2)

## 2020-12-18 LAB — GLUCOSE, CAPILLARY
Glucose-Capillary: 107 mg/dL — ABNORMAL HIGH (ref 70–99)
Glucose-Capillary: 119 mg/dL — ABNORMAL HIGH (ref 70–99)
Glucose-Capillary: 119 mg/dL — ABNORMAL HIGH (ref 70–99)
Glucose-Capillary: 120 mg/dL — ABNORMAL HIGH (ref 70–99)
Glucose-Capillary: 121 mg/dL — ABNORMAL HIGH (ref 70–99)
Glucose-Capillary: 136 mg/dL — ABNORMAL HIGH (ref 70–99)

## 2020-12-18 LAB — MAGNESIUM: Magnesium: 1.8 mg/dL (ref 1.7–2.4)

## 2020-12-18 LAB — VANCOMYCIN, TROUGH: Vancomycin Tr: 12 ug/mL — ABNORMAL LOW (ref 15–20)

## 2020-12-18 NOTE — Progress Notes (Signed)
Pharmacy Antibiotic Note  Shannon [name] is a 74 y.o. male with hx quadriplegia,  neurogenic bladder, sacral osteomyelitis and stage IV sacral decubitus ulcer presented to the ED on 12/08/2020 with AMS and hypotension.  He's currently on meropenem and vancomycin for SSTI. ID recom to treat for 14 days with abx.  - PICC removed on 6/24  Today, 12/18/2020: - day #11/14 abx - afeb - scr low (last bmet on 6/25) - 6/26 VP 1305 = 27, 6/27 VT 0930 = 12 (calculated AUC 462, Cmax 29.3, Cmin 11.7)  Plan: - continue meropenem 1gm q8h and vancomycin 1gm IV q24h  _____________________________________________  Height: 5\' 7"  (170.2 cm) Weight: 89.5 kg (197 lb 5 oz) IBW/kg (Calculated) : 66.1  Temp (24hrs), Avg:99 F (37.2 C), Min:97.8 F (36.6 C), Max:99.8 F (37.7 C)  Recent Labs  Lab 12/12/20 0500 12/13/20 0357 12/13/20 0916 12/13/20 1217 12/14/20 0255 12/16/20 0325 12/17/20 1305  WBC 20.6* 18.5*  --   --   --  13.7*  --   CREATININE <0.30* 0.42*  --   --  0.45* 0.49*  --   VANCOTROUGH  --   --  15  --   --   --   --   VANCOPEAK  --   --   --  34  --   --  27*    Estimated Creatinine Clearance: 86.5 mL/min (A) (by C-G formula based on SCr of 0.49 mg/dL (L)).    Allergies  Allergen Reactions   Iodine Swelling    NOT CT CONTRAST   Penicillins Swelling    ** tolerates cephalosporins Facial swelling, itchy throat    Antimicrobials this admission: 6/17 Cefepime >> 6/18 6/17 Vanc >> 6/18 Meropenem >>   Dose adjustments this admission: 6/18 Vanc 750mg  q24 > 1250mg  q24 6/22 0916 VT 15, 1217 VP 34.  Calculated AUC 570.  Change to Vanc 1g IV q24h for AUC 456 (Cmin 11.7, Cmax 28.7) 6/26 VP 1305 = 27, 6/27 VT 0930 = 12   Microbiology results: 6/17 BCx: 1/4 staph epi, MR detected 6/17 UCx: mult sp-final 11/13/20 MRSA PCR: positive 6/21 BCx: NGF  Thank you for allowing pharmacy to be a part of this patient's care.  7/17 12/18/2020 8:12 AM

## 2020-12-18 NOTE — Progress Notes (Signed)
[facility] [address] [facility]) Hospitalized Hospice Patient   Shannon [name] is a current hospice patient with [facility]. He was admitted to hospice services on 10/27/20 with a terminal diagnosis of abnormal weight loss. On 12/08/2020, patient was found by staff at [facility] unresponsive, and hypotensive with SBPs in the 60s. Shannon [name] was transported to [facility] ED for further evaluation. On arrival to the emergency department patient remained in shock and was started on norepinephrine infusion via PICC line and started on sepsis protocol receiving vancomycin and cefepime. Per Dr. Kirt Boys, [facility] MD, Shannon is a related admission.   Report exchanged with bedside RN and ST. Shannon [name] had just returned from a MBS and was feeing "frustrated" by all of the testing. Shannon [name] denied any pain or unmanaged symptoms. His appeared to be comfortable but not at his baseline. He is normally smiling and engaged easily in conversation.    Shannon [name] continues to be appropriate for inpatient services due to the need for IV medications for treatment of septic shock secondary to osteomyelitis, and likely RLL PNA.    VS: 99.7 oral, 90/61, HR 102, RR 19, 97% 2L [place] I/O: 1075/5425 Labs: RBC 3.31, hgb 8.3, HCT 27.8 Diagnostics: MBS - dysphagia 1, pureed solids, meds crushed.    IV and PRN medications: Merem 1g IV TID, vancomycin 1g IV QD      Problem List:    Quadriplegia Respiratory insufficiency -Remains on oxygen supplementation @ 2 lpm -Poor candidate for mechanical ventilation should he decline -Plan is to continue aggressive measure at present   Encephalopathy -Mentation appears to be improving although he is not at his baseline -Remains profoundly weak   Septic shock -likely secondary to osteomyelitis and RLL PNA -Tolerating midodrine - Merem and vancomycin  Chronic indwelling Foley History of nephrolithiasis -Foley was replaced 6/21   Severe protein calorie  malnutrition Chronic PEG -Continue tube feeds   Discharge Planning: Back to facility when medically optimized   Family Contact: No family present and daughter did not answer phone. Shannon [name] said she will be by today after work and he will update her.    IDT: Hospice team updated   Goals of Care: Full code; daughter continues to desire all measures taken to treat any and all conditions.    At time of discharge please use [facility] for transport.  Wallis Bamberg RN, BSN, CCRN [facility] Liaison

## 2020-12-18 NOTE — Progress Notes (Signed)
Modified Barium Swallow Progress Note  Patient Details  Name: Shannon [name] MRN: [id number] Date of Birth: 1946/02/22  Today's Date: 12/18/2020  Modified Barium Swallow completed.  Full report located under Chart Review in the Imaging Section.  Brief recommendations include the following:  Clinical Impression  Patient presents with a moderate oropharyngeal dysphagia with swallow function significantly changed as compared to [facility] 05/2020. Patient's head posture is tilted back, suspect due to curvature in cervical spine (no radiologist to confirm). Patient exhibited delay in anterior to posterior transit of all boluses, decreased mastication, piecemeal swallowing (noted mostly with thin liquids). With puree solids, regular solids boluses, moderate amount of vallecular sinus residuals and mild amount posterior pharyngeal wall residuals observed however this decreased to minimal with subsequent swallows. Fairly consistent flash penetration (PAS 2) occured with thin liquids during the swallow and one instance of silent penetration to vocal cords (PAS 4) of trace amount which then led to penetrate to pass below vocal cords before then being ejected out. Patient also exhibited prominent cricopharyngeal bar as well as appearance of decreased UES opening and ACDF hardwarem curvature of cervical spine and surrounding tissues appearing to cause narrowing of esophagus. SLP is recommending Dys 1 solids, thin liquids at this time.   Swallow Evaluation Recommendations       SLP Diet Recommendations: Dysphagia 1 (Puree) solids;Thin liquid   Liquid Administration via: Cup;Straw   Medication Administration: Crushed with puree   Supervision: Full supervision/cueing for compensatory strategies;Staff to assist with self feeding   Compensations: Minimize environmental distractions;Slow rate;Small sips/bites   Postural Changes: Seated upright at 90 degrees;Remain semi-upright after after feeds/meals (Comment)  (20-30 minutes)   Oral Care Recommendations: Oral care BID;Staff/trained caregiver to provide oral care       Angela Nevin, MA, CCC-SLP Speech Therapy

## 2020-12-18 NOTE — Plan of Care (Signed)

## 2020-12-18 NOTE — Progress Notes (Signed)
PROGRESS NOTE    Shannon [name]  LZJ:[id number] DOB: May 17, 1946 DOA: 12/08/2020 PCP: Tammi Sou, MD   Brief Narrative:  75 yo PMH quadriplegia, neurogenic bladder, sacral osteomyelitis and stage IV sacral decubitus ulcer , bed bound,  Long  term resident of  [facility], severe protein calorie malnutrition. Hospice at [facility] was initiated early May 2022. Then, with hematuria and hemorrhagic shock  due to traumatic foley at [facility] requiring admission/cysto/blood clot evac/fulguration. Admitted 5/22 - 11/18/20  and discharged back to [place]. Due to hospice status abx for osteo were stopped (vanc and ertapenem) continued receiving hospice care though as full code. He then presented to ED 12/08/20 after being found unresponsive at facility. Usual state of health 6/16. Then on 12/08/2020 found by staff unresponsive, and hypotensive SBPs 60s on EMS arrival which did not improve with IVF and was started on an epi infusion by EMS. On arrival to the ED patient remained in shock, was started on NE infusion via PICC line (chronic LUE - present on admission) and started on sepsis protocol receiving vanc and cefepime  LA 8.1 WBC 26 Cr 1.61 (baseline 0.4) AG 14 (prior <5) alk phos 226 AST 229 ALT 99 Trop 3292 Ddimer >20,000 . CXR with L PICC, small R pleural effusion . CT chest with RLL pneumonia (aspn v HCA); he does not have trach. Started on pressors and s/p 3L fluid by ER at time of CCM eval 12/08/2020 and more responsive. No hx of bleeing this admit. Lactate with 10% clearance in 2h since admit   Code status reversed to full code at time of admission    Assessment & Plan:   Active Problems:   Quadriplegia and quadriparesis (HCC)   Chronic indwelling Foley catheter   Sacral decubitus ulcer   Sacral osteomyelitis (HCC)   Stage IV pressure ulcer of sacral region ([place])   Severe sepsis with septic shock (HCC)   Quadriplegia/acute hypoxic respiratory failure -Continue oxygen supplementation,  currently on 2 L.  Try to wean. -Will be a "poor candidate for mechanical ventilation should he decline -Plan is to continue aggressive measures at present   Metabolic encephalopathy: Completely alert and oriented.   Septic shock-present on admission Source of infection appears to be chronic sacral wound with associated osteomyelitis, possible right lower lobe pneumonia. Has a chronic PICC-placed for osteomyelitis-removed on 12/15/2020 Sacral osteomyelitis April 2022-antibiotics were held May 2022 as patient was discharged to hospice status at the time.  Off of vasopressors since 12/16/2020.  Appreciate ID input. -Recommendation is for 14 days of antibiotics(Merrem and vancomycin)-with last dose on 12/22/2020.   History of diastolic dysfunction on echo on May 2022 Concern for non-ST elevation MI: Troponin significantly elevated 3292 and 2965 on 12/08/2020 however echo done on 12/09/2020 does not show wall motion about it.  Patient has remained hemodynamically stable.  Not a candidate for any further intervention.  Chronic indwelling Foley History of nephrolithiasis -Foley replaced 6/21  Severe protein calorie malnutrition Chronic PEG -Continue tube feeds  Chronic anemia due to chronic illness Acute on chronic anemia due to critical illness, septic shock Mild thrombocytopenia-stable  DVT prophylaxis: heparin injection 5,000 Units Start: 12/12/20 1400 SCDs Start: 12/08/20 1807   Code Status: Full Code  Family Communication:  None present at bedside.    Status is: Inpatient  Remains inpatient appropriate because:Inpatient level of care appropriate due to severity of illness  Dispo: The patient is from: SNF  Anticipated d/c is to: SNF              Patient currently is not medically stable to d/c.   Difficult to place patient No        Estimated body mass index is 30.9 kg/m as calculated from the following:   Height as of this encounter: 5' 7"  (1.702 m).   Weight as  of this encounter: 89.5 kg.  Pressure Injury 06/20/20 Vertebral column Lower;Medial Stage 2 -  Partial thickness loss of dermis presenting as a shallow open injury with a red, pink wound bed without slough. (Active)  06/20/20 0130  Location: Vertebral column  Location Orientation: Lower;Medial  Staging: Stage 2 -  Partial thickness loss of dermis presenting as a shallow open injury with a red, pink wound bed without slough.  Wound Description (Comments):   Present on Admission: Yes     Pressure Injury 06/22/20 Sacrum Posterior Stage 4 - Full thickness tissue loss with exposed bone, tendon or muscle. PHYSICAL THERAPY/HYDROTHERAPY (Active)  06/22/20 1400  Location: Sacrum  Location Orientation: Posterior  Staging: Stage 4 - Full thickness tissue loss with exposed bone, tendon or muscle.  Wound Description (Comments): PHYSICAL THERAPY/HYDROTHERAPY  Present on Admission: Yes     Pressure Injury 12/14/20 Head Lower;Posterior Deep Tissue Pressure Injury - Purple or maroon localized area of discolored intact skin or blood-filled blister due to damage of underlying soft tissue from pressure and/or shear. Maroon discoloration, bleedi (Active)  12/14/20 0600  Location: Head  Location Orientation: Lower;Posterior  Staging: Deep Tissue Pressure Injury - Purple or maroon localized area of discolored intact skin or blood-filled blister due to damage of underlying soft tissue from pressure and/or shear.  Wound Description (Comments): Maroon discoloration, bleeding, nonblanchable  Present on Admission:      Nutritional status:  Nutrition Problem: Severe Malnutrition Etiology: chronic illness (Parkinson's disease)   Signs/Symptoms: moderate fat depletion, moderate muscle depletion, severe muscle depletion, edema   Interventions: Tube feeding, Juven, Prostat    Consultants:  None  Procedures:  None  Antimicrobials:  Anti-infectives (From admission, onward)    Start     Dose/Rate Route  Frequency Ordered Stop   12/14/20 1200  vancomycin (VANCOREADY) IVPB 1000 mg/200 mL        1,000 mg 200 mL/hr over 60 Minutes Intravenous Every 24 hours 12/13/20 1350     12/10/20 1000  vancomycin (VANCOREADY) IVPB 1250 mg/250 mL  Status:  Discontinued        1,250 mg 166.7 mL/hr over 90 Minutes Intravenous Every 24 hours 12/09/20 1251 12/13/20 1350   12/09/20 1300  meropenem (MERREM) 1 g in sodium chloride 0.9 % 100 mL IVPB        1 g 200 mL/hr over 30 Minutes Intravenous Every 8 hours 12/09/20 1234     12/09/20 1000  vancomycin (VANCOREADY) IVPB 750 mg/150 mL  Status:  Discontinued        750 mg 150 mL/hr over 60 Minutes Intravenous Every 24 hours 12/08/20 1909 12/09/20 1251   12/08/20 2200  ceFEPIme (MAXIPIME) 2 g in sodium chloride 0.9 % 100 mL IVPB  Status:  Discontinued        2 g 200 mL/hr over 30 Minutes Intravenous Every 12 hours 12/08/20 1857 12/09/20 1223   12/08/20 1430  ceFEPIme (MAXIPIME) 2 g in sodium chloride 0.9 % 100 mL IVPB        2 g 200 mL/hr over 30 Minutes Intravenous  Once 12/08/20 1417 12/08/20 1753  12/08/20 1400  vancomycin (VANCOCIN) IVPB 1000 mg/200 mL premix        1,000 mg 200 mL/hr over 60 Minutes Intravenous  Once 12/08/20 1351 12/08/20 1513   12/08/20 1400  aztreonam (AZACTAM) 2 g in sodium chloride 0.9 % 100 mL IVPB  Status:  Discontinued        2 g 200 mL/hr over 30 Minutes Intravenous  Once 12/08/20 1351 12/08/20 1414          Subjective: Seen and examined.  Although, unfortunately quadriplegic but able to have meaningful conversation.  Denies any complaint.  Objective: Vitals:   12/18/20 0500 12/18/20 0608 12/18/20 0758 12/18/20 0950  BP:  (!) 85/63 90/61 94/66   Pulse:  (!) 107 (!) 102 95  Resp:  (!) 21 18 20   Temp:  99 F (37.2 C) 98.7 F (37.1 C) 98.4 F (36.9 C)  TempSrc:  Oral Oral Oral  SpO2:  98% 97% 95%  Weight: 89.5 kg     Height:        Intake/Output Summary (Last 24 hours) at 12/18/2020 1427 Last data filed at  12/18/2020 0900 Gross per 24 hour  Intake 0 ml  Output 4500 ml  Net -4500 ml   Filed Weights   12/16/20 0341 12/17/20 0359 12/18/20 0500  Weight: 93.3 kg 92.1 kg 89.5 kg    Examination:  General exam: Appears calm and comfortable  Respiratory system: Clear to auscultation. Respiratory effort normal. Cardiovascular system: S1 & S2 heard, RRR. No JVD, murmurs, rubs, gallops or clicks. No pedal edema. Gastrointestinal system: Abdomen is nondistended, soft and nontender. No organomegaly or masses felt. Normal bowel sounds heard. Central nervous system: Alert and oriented.  Quadriplegic  Data Reviewed: I have personally reviewed following labs and imaging studies  CBC: Recent Labs  Lab 12/12/20 0500 12/13/20 0357 12/16/20 0325 12/18/20 0924  WBC 20.6* 18.5* 13.7* 8.0  NEUTROABS  --   --   --  4.8  HGB 7.4* 8.7* 7.8* 8.3*  HCT 23.9* 27.6* 25.4* 27.8*  MCV 82.7 82.4 83.3 84.0  PLT 75* 102* 217 295   Basic Metabolic Panel: Recent Labs  Lab 12/12/20 0500 12/13/20 0357 12/14/20 0255 12/16/20 0325 12/18/20 0924  NA 131* 142 141 137 141  K 2.8* 3.7 3.7 4.4 4.4  CL 97* 104 104 105 108  CO2 28 33* 32 28 28  GLUCOSE 168* 110* 142* 124* 131*  BUN 26* 32* 31* 33* 33*  CREATININE <0.30* 0.42* 0.45* 0.49* 0.40*  CALCIUM 8.1* 8.3* 7.9* 7.6* 8.2*  MG 1.7 1.5* 1.7  --  1.8  PHOS 1.8* 2.0* 1.9* 2.9  --    GFR: Estimated Creatinine Clearance: 86.5 mL/min (A) (by C-G formula based on SCr of 0.4 mg/dL (L)). Liver Function Tests: Recent Labs  Lab 12/12/20 0500 12/13/20 0357  AST 57* 40  ALT 46* 40  ALKPHOS 173* 166*  BILITOT 0.9 1.0  PROT 6.2* 6.7  ALBUMIN 1.6* 1.8*   No results for input(s): LIPASE, AMYLASE in the last 168 hours. No results for input(s): AMMONIA in the last 168 hours. Coagulation Profile: No results for input(s): INR, PROTIME in the last 168 hours. Cardiac Enzymes: No results for input(s): CKTOTAL, CKMB, CKMBINDEX, TROPONINI in the last 168 hours. BNP  (last 3 results) No results for input(s): PROBNP in the last 8760 hours. HbA1C: No results for input(s): HGBA1C in the last 72 hours. CBG: Recent Labs  Lab 12/17/20 1956 12/18/20 0003 12/18/20 0347 12/18/20 0733 12/18/20 1133  GLUCAP  119* 119* 119* 121* 136*   Lipid Profile: No results for input(s): CHOL, HDL, LDLCALC, TRIG, CHOLHDL, LDLDIRECT in the last 72 hours. Thyroid Function Tests: No results for input(s): TSH, T4TOTAL, FREET4, T3FREE, THYROIDAB in the last 72 hours. Anemia Panel: No results for input(s): VITAMINB12, FOLATE, FERRITIN, TIBC, IRON, RETICCTPCT in the last 72 hours. Sepsis Labs: No results for input(s): PROCALCITON, LATICACIDVEN in the last 168 hours.  Recent Results (from the past 240 hour(s))  Culture, blood (routine x 2)     Status: None   Collection Time: 12/12/20 10:46 AM   Specimen: BLOOD  Result Value Ref Range Status   Specimen Description   Final    BLOOD PICC LINE Performed at [facility], [place] [address] [place], [place] [address]    Special Requests   Final    BOTTLES DRAWN AEROBIC AND ANAEROBIC Blood Culture adequate volume Performed at [facility] [address], [place], [place] [address]    Culture   Final    NO GROWTH 5 DAYS Performed at [facility], [place] [address] [place], [place] [address]    Report Status 12/17/2020 FINAL  Final  Culture, blood (routine x 2)     Status: None   Collection Time: 12/12/20 10:46 AM   Specimen: BLOOD  Result Value Ref Range Status   Specimen Description   Final    BLOOD BLOOD RIGHT HAND Performed at [facility] [address], [place], [place] [address]    Special Requests   Final    BOTTLES DRAWN AEROBIC AND ANAEROBIC Blood Culture adequate volume Performed at [facility] [address], [place], [place] [address]    Culture   Final    NO GROWTH 5 DAYS Performed at [facility], [place] [address], [place],  [address]    Report Status 12/17/2020 FINAL  Final      Radiology Studies: DG Swallowing Func-Speech Pathology  Result Date: 12/18/2020 Formatting of this result is different from the original. 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 Sherwood Gambler, 1992-->surg at [facility].  Summer 2021->Cervicalgia,arm pain, hand atrophy RUE wkness, hyperreflex (Dr. Rosalita Levan MRI: extensive cord atrophy and spinal and foraminal stenosis->to get surgery 03/2020  Hay fever   Hypertension   Osteoarthritis of both hands  Past Surgical History: Past Surgical History: Procedure Laterality Date  ANTERIOR CERVICAL DECOMP/DISCECTOMY FUSION N/A 04/05/2020  Procedure: ANTERIOR CERVICAL DECOMPRESSION/DISCECTOMY FUSION, INTERBODY PROSTHESIS, PLATE/SCREWS CERVICAL THREE-CERVICAL FOUR, CERVICAL FOUR- CERVICAL FIVE;  Surgeon: Newman Pies, MD;  Location: [facility];  Service: Neurosurgery;  Laterality: N/A;  ANTERIOR CERVICAL DECOMP/DISCECTOMY FUSION N/A 04/22/2020  Procedure: Reexploration of anterior cervical wound for epidural hematoma;  Surgeon: Kary Kos, MD;  Location: [facility];  Service: Neurosurgery;  Laterality: N/A;  BACK SURGERY    CYSTOSCOPY N/A 11/13/2020  Procedure: CYSTOSCOPY, CLOT EVACUATION, FULGURATION;  Surgeon: Robley Fries, MD;  Location: [facility];  Service: Urology;  Laterality: N/A;  INCISION AND DRAINAGE Left 2012  L hand infection  IR GASTROSTOMY TUBE MOD SED  07/05/2020  ROTATOR CUFF REPAIR Right   x 3  SPINE SURGERY   HPI: 75 yo male adm to [facility] with AMS  - septic, hypotensive.  Prior to admit, pt is being followed by hospice and remains a full code.  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  Pt with dysphagia diagnosed post=ACDF via several MBS studies showing silent aspiration - 11/2 and 11/12.  Most recent exam 06/05/2020 showed improvement with swallowing with recommendation to advance to regular/thin.  He received a a PEG tube 06/2020 due to malnutrition.  Pt reports he eats "whatever he wants" at his SNF.  He does endorse increased mucus/coughing not always associated with po intake. At this time, pt is receiving tube feeding for nutrition and otherwise is NPO.  Subjective: awake, alert Assessment / Plan / Recommendation CHL IP CLINICAL IMPRESSIONS 12/18/2020 Clinical Impression Patient presents with a moderate oropharyngeal dysphagia with swallow function significantly changed as compared to [facility] 05/2020. Patient's head posture is tilted back, suspect due to curvature in cervical spine (no radiologist to confirm). Patient exhibited delay in anterior to posterior transit of all boluses, decreased mastication, piecemeal swallowing (noted mostly with thin liquids). With puree solids, regular solids boluses, moderate amount of vallecular sinus residuals and mild amount posterior pharyngeal wall residuals observed however this decreased to minimal with subsequent swallows. Fairly consistent flash penetration (PAS 2) occured with thin liquids during the swallow and one instance of silent penetration to vocal cords (PAS 4) of trace amount which then led to penetrate to pass below vocal cords before then being ejected out. Patient also exhibited prominent cricopharyngeal bar as well as appearance of decreased UES opening and ACDF hardwarem curvature of cervical spine and surrounding tissues appearing  to cause narrowing of esophagus. SLP is recommending Dys 1 solids, thin liquids at this time. SLP Visit Diagnosis -- Attention and concentration deficit following -- Frontal lobe and executive function deficit following -- Impact on safety and function --   CHL IP TREATMENT RECOMMENDATION 12/18/2020 Treatment Recommendations Therapy as outlined in treatment plan below   Prognosis 12/18/2020 Prognosis for Safe Diet Advancement Good Barriers to Reach Goals -- Barriers/Prognosis Comment -- CHL IP DIET RECOMMENDATION 12/18/2020 SLP Diet Recommendations Dysphagia 1 (Puree) solids;Thin liquid Liquid Administration via Cup;Straw Medication Administration Crushed with puree Compensations Minimize environmental distractions;Slow rate;Small sips/bites Postural Changes Seated upright at 90 degrees;Remain semi-upright after after feeds/meals (Comment)   CHL IP OTHER RECOMMENDATIONS 12/18/2020 Recommended Consults -- Oral Care Recommendations Oral care BID;Staff/trained caregiver to provide oral care Other Recommendations --   CHL IP FOLLOW UP RECOMMENDATIONS 12/18/2020 Follow up Recommendations Skilled Nursing facility;24 hour supervision/assistance   CHL IP FREQUENCY AND DURATION 12/18/2020 Speech Therapy Frequency (ACUTE ONLY) min 2x/week Treatment Duration 1 week      CHL IP ORAL PHASE 12/18/2020 Oral Phase Impaired Oral - Pudding Teaspoon -- Oral - Pudding Cup -- Oral - Honey Teaspoon -- Oral - Honey Cup -- Oral - Nectar Teaspoon -- Oral - Nectar Cup -- Oral - Nectar Straw -- Oral - Thin Teaspoon -- Oral - Thin Cup Piecemeal swallowing;Delayed oral transit;Reduced posterior propulsion;Weak lingual manipulation Oral - Thin Straw Weak lingual manipulation;Piecemeal swallowing;Delayed oral transit Oral - Puree Weak lingual manipulation;Delayed oral transit Oral - Mech Soft -- Oral - Regular Weak lingual manipulation;Impaired mastication;Delayed oral transit Oral - Multi-Consistency -- Oral - Pill -- Oral Phase - Comment --  CHL IP  PHARYNGEAL PHASE 12/18/2020 Pharyngeal Phase Impaired Pharyngeal- Pudding Teaspoon -- Pharyngeal -- Pharyngeal- Pudding Cup -- Pharyngeal -- Pharyngeal- Honey Teaspoon -- Pharyngeal -- Pharyngeal- Honey Cup -- Pharyngeal -- Pharyngeal- Nectar Teaspoon -- Pharyngeal -- Pharyngeal- Nectar Cup Penetration/Aspiration during swallow;Pharyngeal residue - valleculae;Pharyngeal residue - posterior pharnyx Pharyngeal Material enters airway, remains ABOVE vocal cords then ejected out Pharyngeal- Nectar Straw -- Pharyngeal -- Pharyngeal- Thin Teaspoon -- Pharyngeal -- Pharyngeal- Thin Cup Reduced anterior laryngeal mobility;Reduced epiglottic inversion;Reduced laryngeal elevation;Reduced airway/laryngeal closure;Reduced tongue base retraction;Penetration/Aspiration during swallow;Penetration/Apiration after swallow;Pharyngeal residue - valleculae Pharyngeal Material enters airway, remains ABOVE vocal cords then ejected out Pharyngeal- Thin Straw Penetration/Aspiration during swallow;Pharyngeal residue - valleculae;Reduced epiglottic inversion;Reduced anterior laryngeal mobility;Reduced laryngeal elevation;Reduced airway/laryngeal closure;Reduced tongue base retraction Pharyngeal Material enters airway, remains ABOVE vocal cords then ejected out Pharyngeal- Puree Pharyngeal residue - valleculae;Pharyngeal residue - pyriform;Pharyngeal residue - posterior pharnyx Pharyngeal -- Pharyngeal- Mechanical Soft -- Pharyngeal -- Pharyngeal- Regular Pharyngeal residue - valleculae;Pharyngeal residue - pyriform;Reduced pharyngeal peristalsis Pharyngeal -- Pharyngeal- Multi-consistency -- Pharyngeal -- Pharyngeal- Pill -- Pharyngeal -- Pharyngeal Comment --  CHL IP CERVICAL ESOPHAGEAL PHASE 12/18/2020 Cervical Esophageal Phase Impaired Pudding Teaspoon -- Pudding Cup -- Honey Teaspoon -- Honey Cup -- Nectar Teaspoon -- Nectar Cup Reduced cricopharyngeal relaxation;Prominent cricopharyngeal segment Nectar Straw -- Thin Teaspoon -- Thin Cup  Reduced cricopharyngeal relaxation;Prominent cricopharyngeal segment Thin Straw Reduced cricopharyngeal relaxation;Prominent cricopharyngeal segment Puree Reduced cricopharyngeal relaxation;Prominent cricopharyngeal segment Mechanical Soft -- Regular Prominent cricopharyngeal segment;Reduced cricopharyngeal relaxation Multi-consistency -- Pill -- Cervical Esophageal Comment -- Sonia Baller, MA, CCC-SLP Speech Therapy              Scheduled Meds:  chlorhexidine  15 mL Mouth Rinse BID   Chlorhexidine Gluconate Cloth  6 each Topical Daily   feeding supplement (OSMOLITE 1.5 CAL)  1,000 mL Per Tube Q24H   feeding supplement (PROSource TF)  90 mL Per Tube BID   free water  200 mL Per Tube Q8H   heparin injection (subcutaneous)  5,000 Units Subcutaneous Q8H   insulin aspart  0-15 Units Subcutaneous Q4H   mouth rinse  15 mL Mouth Rinse q12n4p   midodrine  10 mg Per Tube TID with meals   nutrition supplement (JUVEN)  1 packet Per Tube BID BM   pantoprazole sodium  40 mg Per Tube Daily   pregabalin  100 mg Per Tube BID   Continuous Infusions:  sodium chloride 10 mL/hr at 12/15/20 1851   meropenem (MERREM) IV 1 g (12/18/20 1124)   vancomycin 1,000 mg (12/18/20 1230)     LOS: 10 days   Time spent: 37 minutes   Darliss Cheney, MD [facility]  12/18/2020, 2:27 PM   How to contact the [facility] Attending or Consulting provider [place] or covering provider during after hours 7P -7A, for this patient?  Check the care team in [employer] and look for a) attending/consulting TR[facility] provider listed and b) the [facility] team listed. Page or secure chat [phone]-7P. Log into [email or web address] and use [place]'s universal password to access. If you do not have the password, please contact the hospital operator. Locate the [facility] provider you are looking for under [facility] and page to a number that you can be directly reached. If you still have difficulty reaching the provider, please page the [facility] (Director on Call) for  the Hospitalists listed on amion for assistance.

## 2020-12-19 LAB — GLUCOSE, CAPILLARY
Glucose-Capillary: 108 mg/dL — ABNORMAL HIGH (ref 70–99)
Glucose-Capillary: 111 mg/dL — ABNORMAL HIGH (ref 70–99)
Glucose-Capillary: 118 mg/dL — ABNORMAL HIGH (ref 70–99)
Glucose-Capillary: 125 mg/dL — ABNORMAL HIGH (ref 70–99)
Glucose-Capillary: 129 mg/dL — ABNORMAL HIGH (ref 70–99)
Glucose-Capillary: 93 mg/dL (ref 70–99)

## 2020-12-19 NOTE — Progress Notes (Signed)
Nutrition Follow-up  DOCUMENTATION CODES:   Severe malnutrition in context of chronic illness  INTERVENTION:  - will monitor for potential diet advancement and if TF needs adjusted based on this. - continue Osmolite 1.5 @ 60 with 90 ml Prosource TF BID, Juven BID, and 200 ml free water TID.    NUTRITION DIAGNOSIS:   Severe Malnutrition related to chronic illness (Parkinson's disease) as evidenced by moderate fat depletion, moderate muscle depletion, severe muscle depletion, edema. -ongoing  GOAL:   Patient will meet greater than or equal to 90% of their needs -met with TF regimen  MONITOR:   TF tolerance, Labs, Weight trends, Skin   ASSESSMENT:   75 year-old male with medical history of quadriplegia, neurogenic bladder, sacral osteomyelitis, stage 4 sacral decubitus ulcer, bed bound, and severe protein calorie malnutrition. He is a long-term resident at [facility]. He presented to the ED on 6/17 after being found unresponsive at [facility].  Patient laying in bed sleeping with no family or visitors present at this time. He remains NPO at this time, but SLP recommendation from yesterday was for advancement to Dysphagia 1, thin liquids.   He has PEG in place and is receiving Osmolite 1.5 at goal rate of 60 ml/hr with 90 ml Prosource TF BID, Juven BID, and 200 ml free water TID. This regimen is providing 2510 kcal, 139 grams protein, and 1697 ml free water.   Weight today is fairly consistent with weights throughout hospitalization, but weight has fluctuated almost daily. Mild pitting edema to BUE and moderate pitting edema to BLE documented in the edema section of flow sheet.   Per notes: - quadriplegia - metabolic encephalopathy--resolved - severe PCM - chronic anemia - likely plan for d/c on 6/30; from SNF with plan to return to SNF      Labs reviewed; CBGs: 111, 93, 129, 108 mg/dl, BUN: 33 mg/dl, creatinine: 0.4 mg/dl, Ca: 8.2 mg/dl.  Medications reviewed; sliding  scale novolog, 40 mg protonix per PEG/day.   Diet Order:   Diet Order             Diet NPO time specified  Diet effective now                   EDUCATION NEEDS:   No education needs have been identified at this time  Skin:  Skin Assessment: Skin Integrity Issues: Skin Integrity Issues:: Stage IV, DTI DTI: lower head (newly documented 6/23) Stage IV: sacrum + osteomyelitis  Last BM:  6/25  Height:   Ht Readings from Last 1 Encounters:  12/08/20 _0  (1.702 m)    Weight:   Wt Readings from Last 1 Encounters:  12/19/20 90.5 kg      Estimated Nutritional Needs:  Kcal:  2300-2500 kcal Protein:  120-135 grams Fluid:  >/= 2 L/day     Shannon Matin, MS, RD, LDN, CNSC Inpatient Clinical Dietitian RD pager # available in [facility]  After hours/weekend pager # available in [facility]

## 2020-12-19 NOTE — Progress Notes (Signed)
SLP Cancellation Note  Patient Details Name: Shannon [name] MRN: [id number] DOB: 08/25/1945   Cancelled treatment:       Reason Eval/Treat Not Completed: Other (comment) (SlP messaged MD re: diet recommendations and if diet ok to order.  Will await follow up until MD approves diet.  Swallow precaution sign left with RN.)  RN report pt consuming ice chips and tolerating.  Thanks.   Rolena Infante, MS [facility] SLP Acute Rehab Services Office [phone] Pager [phone]    Chales Abrahams 12/19/2020, 6:29 PM

## 2020-12-19 NOTE — Progress Notes (Addendum)
[facility] [address] [facility]) Hospitalized Hospice Patient   Shannon [name] is a current hospice patient with [facility]. He was admitted to hospice services on 10/27/20 with a terminal diagnosis of abnormal weight loss. On 12/08/2020, patient was found by staff at [facility] unresponsive, and hypotensive with SBPs in the 60s. Shannon [name] was transported to [facility] ED for further evaluation. On arrival to the emergency department patient remained in shock and was started on norepinephrine infusion via PICC line and started on sepsis protocol receiving vancomycin and cefepime. Per Dr. Kirt Boys, [facility] MD, this is a related admission.   Report exchanged. Visited patient at the bedside. Shannon [name] was more alert today and closer to his baseline. He was complaining of some right foot pain, but states the staff were taking very good care of him. He indicated that his daughter was going to take him to their family home to care for him and he was not returning to [facility]. Attending was able to confirm that unfortunately due to the high level of care he needs, he will be returning to [facility] once his IV antibiotics are completed on 6/30.  V/S: 98.5 oral, 105/67, HR 104, RR 18, SPO2 96% on RA I&O: 800/3150 Labs: none today Diagnostics: none today IVs/PRNs: Merem 1g IV TID, vancomycin 1g IV QD  Shannon [name] continues to be appropriate for inpatient services due to treatment of septic shock secondary to osteomyelitis, and suspected RLL PNA.  Problem List:    Quadriplegia Respiratory insufficiency -Currently on room air -Poor candidate for mechanical ventilation should he decline -Plan is to continue aggressive measure at present   Encephalopathy -Mentation appears to be improving although he is not at his baseline -Remains profoundly weak   Septic shock -likely secondary to osteomyelitis and RLL PNA -Tolerating midodrine - Merem and vancomycin  Chronic indwelling  Foley History of nephrolithiasis -Foley was replaced 6/21   Severe protein calorie malnutrition Chronic PEG -Continue tube feeds   Discharge Planning: Back to facility when medically optimized   Family Contact: No family present and daughter did not answer phone. Shannon [name] said she will be by today after work and he will update her.    IDT: Hospice team updated   Goals of Care: Full code; daughter continues to desire all measures taken to treat any and all conditions.    At time of discharge please use [facility] for transport.   Wallis Bamberg RN, BSN, CCRN [facility] Liaison

## 2020-12-19 NOTE — Plan of Care (Signed)

## 2020-12-19 NOTE — Progress Notes (Signed)
PROGRESS NOTE    Shannon [name]  GSU:[id number] DOB: 12-Jul-1945 DOA: 12/08/2020 PCP: Tammi Sou, MD   Brief Narrative:  75 yo PMH quadriplegia, neurogenic bladder, sacral osteomyelitis and stage IV sacral decubitus ulcer , bed bound,  Long  term resident of  [facility] [facility], severe protein calorie malnutrition. Hospice at [facility] was initiated early May 2022. Then, with hematuria and hemorrhagic shock  due to traumatic foley at [facility] requiring admission/cysto/blood clot evac/fulguration. Admitted 5/22 - 11/18/20  and discharged back to [facility]. Due to hospice status abx for osteo were stopped (vanc and ertapenem) continued receiving hospice care though as full code. He then presented to ED 12/08/20 after being found unresponsive at facility. Usual state of health 6/16. Then on 12/08/2020 found by staff unresponsive, and hypotensive SBPs 60s on EMS arrival which did not improve with IVF and was started on an epi infusion by EMS. On arrival to the ED patient remained in shock, was started on NE infusion via PICC line (chronic LUE - present on admission) and started on sepsis protocol receiving vanc and cefepime  LA 8.1 WBC 26 Cr 1.61 (baseline 0.4) AG 14 (prior <5) alk phos 226 AST 229 ALT 99 Trop 3292 Ddimer >20,000 . CXR with L PICC, small R pleural effusion . CT chest with RLL pneumonia (aspn v HCA); he does not have trach. Started on pressors and s/p 3L fluid by ER at time of CCM eval 12/08/2020 and more responsive. No hx of bleeing this admit. Lactate with 10% clearance in 2h since admit   Code status reversed to full code at time of admission    Assessment & Plan:   Active Problems:   Quadriplegia and quadriparesis (HCC)   Chronic indwelling Foley catheter   Sacral decubitus ulcer   Sacral osteomyelitis (HCC)   Stage IV pressure ulcer of sacral region ([place])   Severe sepsis with septic shock (HCC)  Quadriplegia/acute hypoxic respiratory failure -Continue oxygen supplementation,  currently on 2 L.  Try to wean. -Will be a "poor candidate for mechanical ventilation should he decline -Plan is to continue aggressive measures at present   Metabolic encephalopathy: Completely alert and oriented.   Septic shock-present on admission Source of infection appears to be chronic sacral wound with associated osteomyelitis, possible right lower lobe pneumonia. Has a chronic PICC-placed for osteomyelitis-removed on 12/15/2020 Sacral osteomyelitis April 2022-antibiotics were held May 2022 as patient was discharged to hospice status at the time.  Off of vasopressors since 12/16/2020.  Appreciate ID input. -Recommendation is for 14 days of antibiotics(Merrem and vancomycin)-with last dose on 12/21/2020.   History of diastolic dysfunction on echo on May 2022 Concern for non-ST elevation MI: Troponin significantly elevated 3292 and 2965 on 12/08/2020 however echo done on 12/09/2020 does not show wall motion about it.  Patient has remained hemodynamically stable.  Not a candidate for any further intervention.  Chronic indwelling Foley History of nephrolithiasis -Foley replaced 6/21  Severe protein calorie malnutrition Chronic PEG -Continue tube feeds  Chronic anemia due to chronic illness Acute on chronic anemia due to critical illness, septic shock Mild thrombocytopenia-stable   DVT prophylaxis: heparin injection 5,000 Units Start: 12/12/20 1400 SCDs Start: 12/08/20 1807   Code Status: Full Code  Family Communication:  None present at bedside.  Discussed with his daughter over the phone about plan of discharge 12/21/2020 after his last dose of antibiotics.  Status is: Inpatient  Remains inpatient appropriate because:Inpatient level of care appropriate due to severity of illness  Dispo: The patient is from: SNF              Anticipated d/c is to: SNF              Patient currently is not medically stable to d/c.   Difficult to place patient No        Estimated body mass  index is 31.25 kg/m as calculated from the following:   Height as of this encounter: 5' 7"  (1.702 m).   Weight as of this encounter: 90.5 kg.  Pressure Injury 06/20/20 Vertebral column Lower;Medial Stage 2 -  Partial thickness loss of dermis presenting as a shallow open injury with a red, pink wound bed without slough. (Active)  06/20/20 0130  Location: Vertebral column  Location Orientation: Lower;Medial  Staging: Stage 2 -  Partial thickness loss of dermis presenting as a shallow open injury with a red, pink wound bed without slough.  Wound Description (Comments):   Present on Admission: Yes     Pressure Injury 06/22/20 Sacrum Posterior Stage 4 - Full thickness tissue loss with exposed bone, tendon or muscle. PHYSICAL THERAPY/HYDROTHERAPY (Active)  06/22/20 1400  Location: Sacrum  Location Orientation: Posterior  Staging: Stage 4 - Full thickness tissue loss with exposed bone, tendon or muscle.  Wound Description (Comments): PHYSICAL THERAPY/HYDROTHERAPY  Present on Admission: Yes     Pressure Injury 12/14/20 Head Lower;Posterior Deep Tissue Pressure Injury - Purple or maroon localized area of discolored intact skin or blood-filled blister due to damage of underlying soft tissue from pressure and/or shear. Maroon discoloration, bleedi (Active)  12/14/20 0600  Location: Head  Location Orientation: Lower;Posterior  Staging: Deep Tissue Pressure Injury - Purple or maroon localized area of discolored intact skin or blood-filled blister due to damage of underlying soft tissue from pressure and/or shear.  Wound Description (Comments): Maroon discoloration, bleeding, nonblanchable  Present on Admission:      Nutritional status:  Nutrition Problem: Severe Malnutrition Etiology: chronic illness (Parkinson's disease)   Signs/Symptoms: moderate fat depletion, moderate muscle depletion, severe muscle depletion, edema   Interventions: Tube feeding, Juven, Prostat    Consultants:   None  Procedures:  None  Antimicrobials:  Anti-infectives (From admission, onward)    Start     Dose/Rate Route Frequency Ordered Stop   12/14/20 1200  vancomycin (VANCOREADY) IVPB 1000 mg/200 mL        1,000 mg 200 mL/hr over 60 Minutes Intravenous Every 24 hours 12/13/20 1350 12/21/20 2359   12/10/20 1000  vancomycin (VANCOREADY) IVPB 1250 mg/250 mL  Status:  Discontinued        1,250 mg 166.7 mL/hr over 90 Minutes Intravenous Every 24 hours 12/09/20 1251 12/13/20 1350   12/09/20 1300  meropenem (MERREM) 1 g in sodium chloride 0.9 % 100 mL IVPB        1 g 200 mL/hr over 30 Minutes Intravenous Every 8 hours 12/09/20 1234 12/21/20 2359   12/09/20 1000  vancomycin (VANCOREADY) IVPB 750 mg/150 mL  Status:  Discontinued        750 mg 150 mL/hr over 60 Minutes Intravenous Every 24 hours 12/08/20 1909 12/09/20 1251   12/08/20 2200  ceFEPIme (MAXIPIME) 2 g in sodium chloride 0.9 % 100 mL IVPB  Status:  Discontinued        2 g 200 mL/hr over 30 Minutes Intravenous Every 12 hours 12/08/20 1857 12/09/20 1223   12/08/20 1430  ceFEPIme (MAXIPIME) 2 g in sodium chloride 0.9 % 100 mL IVPB  2 g 200 mL/hr over 30 Minutes Intravenous  Once 12/08/20 1417 12/08/20 1753   12/08/20 1400  vancomycin (VANCOCIN) IVPB 1000 mg/200 mL premix        1,000 mg 200 mL/hr over 60 Minutes Intravenous  Once 12/08/20 1351 12/08/20 1513   12/08/20 1400  aztreonam (AZACTAM) 2 g in sodium chloride 0.9 % 100 mL IVPB  Status:  Discontinued        2 g 200 mL/hr over 30 Minutes Intravenous  Once 12/08/20 1351 12/08/20 1414          Subjective: Seen and examined.  Fully alert and oriented.  He states that he does not want to go back to [facility] and wants to go home where he has been aggressive and wants to take care of himself.  He understands that he is quadriplegic and cannot take care of himself.   Objective: Vitals:   12/18/20 2022 12/19/20 0445 12/19/20 0500 12/19/20 1228  BP: (!) 106/57 98/63   (!) 106/57  Pulse: (!) 106 (!) 113  (!) 104  Resp: 20 18  18   Temp: 98.5 F (36.9 C) 98.1 F (36.7 C)  98.5 F (36.9 C)  TempSrc: Oral Oral  Oral  SpO2: 94% 94%  96%  Weight:   90.5 kg   Height:        Intake/Output Summary (Last 24 hours) at 12/19/2020 1301 Last data filed at 12/19/2020 1230 Gross per 24 hour  Intake 800 ml  Output 3600 ml  Net -2800 ml    Filed Weights   12/17/20 0359 12/18/20 0500 12/19/20 0500  Weight: 92.1 kg 89.5 kg 90.5 kg    Examination:  General exam: Appears calm and comfortable  Respiratory system: Clear to auscultation. Respiratory effort normal. Cardiovascular system: S1 & S2 heard, RRR. No JVD, murmurs, rubs, gallops or clicks. No pedal edema. Gastrointestinal system: Abdomen is nondistended, soft and nontender. No organomegaly or masses felt. Normal bowel sounds heard. Central nervous system: Alert and oriented.  Quadriplegic  Data Reviewed: I have personally reviewed following labs and imaging studies  CBC: Recent Labs  Lab 12/13/20 0357 12/16/20 0325 12/18/20 0924  WBC 18.5* 13.7* 8.0  NEUTROABS  --   --  4.8  HGB 8.7* 7.8* 8.3*  HCT 27.6* 25.4* 27.8*  MCV 82.4 83.3 84.0  PLT 102* 217 638    Basic Metabolic Panel: Recent Labs  Lab 12/13/20 0357 12/14/20 0255 12/16/20 0325 12/18/20 0924  NA 142 141 137 141  K 3.7 3.7 4.4 4.4  CL 104 104 105 108  CO2 33* 32 28 28  GLUCOSE 110* 142* 124* 131*  BUN 32* 31* 33* 33*  CREATININE 0.42* 0.45* 0.49* 0.40*  CALCIUM 8.3* 7.9* 7.6* 8.2*  MG 1.5* 1.7  --  1.8  PHOS 2.0* 1.9* 2.9  --     GFR: Estimated Creatinine Clearance: 87 mL/min (A) (by C-G formula based on SCr of 0.4 mg/dL (L)). Liver Function Tests: Recent Labs  Lab 12/13/20 0357  AST 40  ALT 40  ALKPHOS 166*  BILITOT 1.0  PROT 6.7  ALBUMIN 1.8*    No results for input(s): LIPASE, AMYLASE in the last 168 hours. No results for input(s): AMMONIA in the last 168 hours. Coagulation Profile: No results for  input(s): INR, PROTIME in the last 168 hours. Cardiac Enzymes: No results for input(s): CKTOTAL, CKMB, CKMBINDEX, TROPONINI in the last 168 hours. BNP (last 3 results) No results for input(s): PROBNP in the last 8760 hours. HbA1C:  No results for input(s): HGBA1C in the last 72 hours. CBG: Recent Labs  Lab 12/18/20 2014 12/19/20 0058 12/19/20 0435 12/19/20 0807 12/19/20 1225  GLUCAP 120* 111* 93 129* 108*    Lipid Profile: No results for input(s): CHOL, HDL, LDLCALC, TRIG, CHOLHDL, LDLDIRECT in the last 72 hours. Thyroid Function Tests: No results for input(s): TSH, T4TOTAL, FREET4, T3FREE, THYROIDAB in the last 72 hours. Anemia Panel: No results for input(s): VITAMINB12, FOLATE, FERRITIN, TIBC, IRON, RETICCTPCT in the last 72 hours. Sepsis Labs: No results for input(s): PROCALCITON, LATICACIDVEN in the last 168 hours.  Recent Results (from the past 240 hour(s))  Culture, blood (routine x 2)     Status: None   Collection Time: 12/12/20 10:46 AM   Specimen: BLOOD  Result Value Ref Range Status   Specimen Description   Final    BLOOD PICC LINE Performed at [facility], [place] [address], [place], [place] [address]    Special Requests   Final    BOTTLES DRAWN AEROBIC AND ANAEROBIC Blood Culture adequate volume Performed at [place] [address], [place], [place] [address]    Culture   Final    NO GROWTH 5 DAYS Performed at [facility], [place] [address], [place], [place] [address]    Report Status 12/17/2020 FINAL  Final  Culture, blood (routine x 2)     Status: None   Collection Time: 12/12/20 10:46 AM   Specimen: BLOOD  Result Value Ref Range Status   Specimen Description   Final    BLOOD BLOOD RIGHT HAND Performed at [place] [address] [place], [place] [address]    Special Requests   Final    BOTTLES DRAWN AEROBIC AND ANAEROBIC Blood Culture adequate volume Performed at [place] [address] [place], [place] [address]    Culture   Final    NO GROWTH 5 DAYS Performed at [facility], [place] [address], [place], [place] [address]    Report Status 12/17/2020 FINAL  Final       Radiology Studies: DG Swallowing Func-Speech Pathology  Result Date: 12/18/2020 Formatting of this result is different from the original. 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 Sherwood Gambler, 1992-->surg at [facility]  Summer 2021->Cervicalgia,arm pain, hand atrophy RUE wkness, hyperreflex (Dr. Rosalita Levan MRI: extensive cord atrophy and spinal and foraminal stenosis->to get surgery 03/2020  Hay fever   Hypertension   Osteoarthritis of both hands  Past Surgical History: Past Surgical History: Procedure Laterality Date  ANTERIOR CERVICAL DECOMP/DISCECTOMY FUSION N/A 04/05/2020  Procedure: ANTERIOR CERVICAL DECOMPRESSION/DISCECTOMY FUSION, INTERBODY PROSTHESIS, PLATE/SCREWS CERVICAL THREE-CERVICAL FOUR, CERVICAL FOUR- CERVICAL FIVE;  Surgeon: Newman Pies, MD;  Location: [facility];  Service: Neurosurgery;  Laterality: N/A;  ANTERIOR CERVICAL DECOMP/DISCECTOMY FUSION N/A 04/22/2020  Procedure: Reexploration of anterior cervical wound for epidural hematoma;  Surgeon: Kary Kos, MD;  Location: [facility];  Service: Neurosurgery;  Laterality: N/A;  BACK SURGERY    CYSTOSCOPY N/A 11/13/2020  Procedure: CYSTOSCOPY, CLOT EVACUATION, FULGURATION;  Surgeon: Robley Fries,  MD;  Location: [facility];  Service: Urology;  Laterality: N/A;  INCISION AND  DRAINAGE Left 2012  L hand infection  IR GASTROSTOMY TUBE MOD SED  07/05/2020  ROTATOR CUFF REPAIR Right   x 3  SPINE SURGERY   HPI: 75 yo male adm to [facility] with AMS - septic, hypotensive.  Prior to admit, pt is being followed by hospice and remains a full code.  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  Pt with dysphagia diagnosed post=ACDF via several MBS studies showing silent aspiration - 11/2 and 11/12.  Most recent exam 06/05/2020 showed improvement with swallowing with recommendation to advance to regular/thin.  He received a a PEG tube 06/2020 due to malnutrition.  Pt reports he eats "whatever he wants" at his SNF.  He does endorse increased mucus/coughing not always associated with po intake. At this time, pt is receiving tube feeding for nutrition and otherwise is NPO.  Subjective: awake, alert Assessment / Plan / Recommendation CHL IP CLINICAL IMPRESSIONS 12/18/2020 Clinical Impression Patient presents with a moderate oropharyngeal dysphagia with swallow function significantly changed as compared to [facility] 05/2020. Patient's head posture is tilted back, suspect due to curvature in cervical spine (no radiologist to confirm). Patient exhibited delay in anterior to posterior transit of all boluses, decreased mastication, piecemeal swallowing (noted mostly with thin liquids). With puree solids, regular solids boluses, moderate amount of vallecular sinus residuals and mild amount posterior pharyngeal wall residuals observed however this decreased to minimal with subsequent swallows. Fairly consistent flash penetration (PAS 2) occured with thin liquids during the swallow and one instance of silent penetration to vocal cords (PAS 4) of trace amount which then led to penetrate to pass below vocal cords before then being ejected out. Patient also  exhibited prominent cricopharyngeal bar as well as appearance of decreased UES opening and ACDF hardwarem curvature of cervical spine and surrounding tissues appearing to cause narrowing of esophagus. SLP is recommending Dys 1 solids, thin liquids at this time. SLP Visit Diagnosis -- Attention and concentration deficit following -- Frontal lobe and executive function deficit following -- Impact on safety and function --   CHL IP TREATMENT RECOMMENDATION 12/18/2020 Treatment Recommendations Therapy as outlined in treatment plan below   Prognosis 12/18/2020 Prognosis for Safe Diet Advancement Good Barriers to Reach Goals -- Barriers/Prognosis Comment -- CHL IP DIET RECOMMENDATION 12/18/2020 SLP Diet Recommendations Dysphagia 1 (Puree) solids;Thin liquid Liquid Administration via Cup;Straw Medication Administration Crushed with puree Compensations Minimize environmental distractions;Slow rate;Small sips/bites Postural Changes Seated upright at 90 degrees;Remain semi-upright after after feeds/meals (Comment)   CHL IP OTHER RECOMMENDATIONS 12/18/2020 Recommended Consults -- Oral Care Recommendations Oral care BID;Staff/trained caregiver to provide oral care Other Recommendations --   CHL IP FOLLOW UP RECOMMENDATIONS 12/18/2020 Follow up Recommendations Skilled Nursing facility;24 hour supervision/assistance   CHL IP FREQUENCY AND DURATION 12/18/2020 Speech Therapy Frequency (ACUTE ONLY) min 2x/week Treatment Duration 1 week      CHL IP ORAL PHASE 12/18/2020 Oral Phase Impaired Oral - Pudding Teaspoon -- Oral - Pudding Cup -- Oral - Honey Teaspoon -- Oral - Honey Cup -- Oral - Nectar Teaspoon -- Oral - Nectar Cup -- Oral - Nectar Straw -- Oral - Thin Teaspoon -- Oral - Thin Cup Piecemeal swallowing;Delayed oral transit;Reduced posterior propulsion;Weak lingual manipulation Oral - Thin Straw Weak lingual manipulation;Piecemeal swallowing;Delayed oral transit Oral - Puree Weak lingual  manipulation;Delayed oral transit Oral - Mech  Soft -- Oral - Regular Weak lingual manipulation;Impaired mastication;Delayed oral transit Oral - Multi-Consistency -- Oral - Pill -- Oral Phase - Comment --  CHL IP PHARYNGEAL PHASE 12/18/2020 Pharyngeal Phase Impaired Pharyngeal- Pudding Teaspoon -- Pharyngeal -- Pharyngeal- Pudding Cup -- Pharyngeal -- Pharyngeal- Honey Teaspoon -- Pharyngeal -- Pharyngeal- Honey Cup -- Pharyngeal -- Pharyngeal- Nectar Teaspoon -- Pharyngeal -- Pharyngeal- Nectar Cup Penetration/Aspiration during swallow;Pharyngeal residue - valleculae;Pharyngeal residue - posterior pharnyx Pharyngeal Material enters airway, remains ABOVE vocal cords then ejected out Pharyngeal- Nectar Straw -- Pharyngeal -- Pharyngeal- Thin Teaspoon -- Pharyngeal -- Pharyngeal- Thin Cup Reduced anterior laryngeal mobility;Reduced epiglottic inversion;Reduced laryngeal elevation;Reduced airway/laryngeal closure;Reduced tongue base retraction;Penetration/Aspiration during swallow;Penetration/Apiration after swallow;Pharyngeal residue - valleculae Pharyngeal Material enters airway, remains ABOVE vocal cords then ejected out Pharyngeal- Thin Straw Penetration/Aspiration during swallow;Pharyngeal residue - valleculae;Reduced epiglottic inversion;Reduced anterior laryngeal mobility;Reduced laryngeal elevation;Reduced airway/laryngeal closure;Reduced tongue base retraction Pharyngeal Material enters airway, remains ABOVE vocal cords then ejected out Pharyngeal- Puree Pharyngeal residue - valleculae;Pharyngeal residue - pyriform;Pharyngeal residue - posterior pharnyx Pharyngeal -- Pharyngeal- Mechanical Soft -- Pharyngeal -- Pharyngeal- Regular Pharyngeal residue - valleculae;Pharyngeal residue - pyriform;Reduced pharyngeal peristalsis Pharyngeal -- Pharyngeal- Multi-consistency -- Pharyngeal -- Pharyngeal- Pill -- Pharyngeal -- Pharyngeal Comment --  CHL IP CERVICAL ESOPHAGEAL PHASE 12/18/2020 Cervical Esophageal Phase Impaired Pudding Teaspoon -- Pudding Cup -- Honey  Teaspoon -- Honey Cup -- Nectar Teaspoon -- Nectar Cup Reduced cricopharyngeal relaxation;Prominent cricopharyngeal segment Nectar Straw -- Thin Teaspoon -- Thin Cup Reduced cricopharyngeal relaxation;Prominent cricopharyngeal segment Thin Straw Reduced cricopharyngeal relaxation;Prominent cricopharyngeal segment Puree Reduced cricopharyngeal relaxation;Prominent cricopharyngeal segment Mechanical Soft -- Regular Prominent cricopharyngeal segment;Reduced cricopharyngeal relaxation Multi-consistency -- Pill -- Cervical Esophageal Comment -- Sonia Baller, MA, CCC-SLP Speech Therapy              Scheduled Meds:  chlorhexidine  15 mL Mouth Rinse BID   Chlorhexidine Gluconate Cloth  6 each Topical Daily   feeding supplement (OSMOLITE 1.5 CAL)  1,000 mL Per Tube Q24H   feeding supplement (PROSource TF)  90 mL Per Tube BID   free water  200 mL Per Tube Q8H   heparin injection (subcutaneous)  5,000 Units Subcutaneous Q8H   insulin aspart  0-15 Units Subcutaneous Q4H   mouth rinse  15 mL Mouth Rinse q12n4p   midodrine  10 mg Per Tube TID with meals   nutrition supplement (JUVEN)  1 packet Per Tube BID BM   pantoprazole sodium  40 mg Per Tube Daily   pregabalin  100 mg Per Tube BID   Continuous Infusions:  sodium chloride 10 mL/hr at 12/15/20 1851   meropenem (MERREM) IV 1 g (12/19/20 0508)   vancomycin 1,000 mg (12/19/20 0847)     LOS: 11 days   Time spent: 29 minutes   Darliss Cheney, MD [facility]  12/19/2020, 1:01 PM   How to contact the [facility] Attending or Consulting provider [place] or covering provider during after hours 7P -7A, for this patient?  Check the care team in [facility] and look for a) attending/consulting TRH provider listed and b) the [facility] team listed. Page or secure chat 7A-7P. Log into [email or web address] and use [place] universal password to access. If you do not have the password, please contact the hospital operator. Locate the [facility] provider you are looking for under  [facility] and page to a number that you can be directly reached. If you still have difficulty reaching the provider, please  page the [facility] (Director on Call) for the Hospitalists listed on amion for assistance.

## 2020-12-20 LAB — GLUCOSE, CAPILLARY
Glucose-Capillary: 107 mg/dL — ABNORMAL HIGH (ref 70–99)
Glucose-Capillary: 110 mg/dL — ABNORMAL HIGH (ref 70–99)
Glucose-Capillary: 111 mg/dL — ABNORMAL HIGH (ref 70–99)
Glucose-Capillary: 111 mg/dL — ABNORMAL HIGH (ref 70–99)
Glucose-Capillary: 115 mg/dL — ABNORMAL HIGH (ref 70–99)
Glucose-Capillary: 129 mg/dL — ABNORMAL HIGH (ref 70–99)
Glucose-Capillary: 97 mg/dL (ref 70–99)

## 2020-12-20 NOTE — TOC Progression Note (Signed)
Transition of Care [facility]) - Progression Note    Patient Details  Name: Nilay Mangrum MRN: [id number] Date of Birth: 02/07/1946  [facility]) CM/SW Contact  Geni Bers, RN Phone Number: 12/20/2020, 11:26 AM  Clinical Narrative:    Pt from [facility] and will return. Admission Coordinator was called and aware that pt may return on 6/30 with [facility].     Expected Discharge Plan: Skilled Nursing Facility [facility] with hsopice) Barriers to Discharge: Continued Medical Work up  Expected Discharge Plan and Services Expected Discharge Plan: Skilled Nursing Facility [facility] with hsopice)   Discharge Planning Services: CM Consult                                           Social Determinants of Health (SDOH) Interventions    Readmission Risk Interventions No flowsheet data found.

## 2020-12-20 NOTE — Progress Notes (Signed)
Pt has Flexiseal and should be discontinued prior to leaving for SNF. Pt will not travel with flexiseal in place. SRP, RN

## 2020-12-20 NOTE — Progress Notes (Signed)
PROGRESS NOTE    Shannon [name]  JYN:[id number] DOB: 1946-02-07 DOA: 12/08/2020 PCP: Tammi Sou, MD   Brief Narrative:  75 yo PMH quadriplegia, neurogenic bladder, sacral osteomyelitis and stage IV sacral decubitus ulcer , bed bound,  Long  term resident of  [facility], severe protein calorie malnutrition. Hospice at [facility] was initiated early May 2022. Then, with hematuria and hemorrhagic shock  due to traumatic foley at [facility] requiring admission/cysto/blood clot evac/fulguration. Admitted 5/22 - 11/18/20  and discharged back to [place]. Due to hospice status abx for osteo were stopped (vanc and ertapenem) continued receiving hospice care though as full code. He then presented to ED 12/08/20 after being found unresponsive at facility. Usual state of health 6/16. Then on 12/08/2020 found by staff unresponsive, and hypotensive SBPs 60s on EMS arrival which did not improve with IVF and was started on an epi infusion by EMS. On arrival to the ED patient remained in shock, was started on NE infusion via PICC line (chronic LUE - present on admission) and started on sepsis protocol receiving vanc and cefepime  LA 8.1 WBC 26 Cr 1.61 (baseline 0.4) AG 14 (prior <5) alk phos 226 AST 229 ALT 99 Trop 3292 Ddimer >20,000 . CXR with L PICC, small R pleural effusion . CT chest with RLL pneumonia (aspn v HCA); he does not have trach. Started on pressors and s/p 3L fluid by ER at time of CCM eval 12/08/2020 and more responsive. No hx of bleeing this admit. Lactate with 10% clearance in 2h since admit   Code status reversed to full code at time of admission    Assessment & Plan:   Active Problems:   Quadriplegia and quadriparesis (HCC)   Chronic indwelling Foley catheter   Sacral decubitus ulcer   Sacral osteomyelitis (HCC)   Stage IV pressure ulcer of sacral region ([place])   Severe sepsis with septic shock (HCC)  Quadriplegia/acute hypoxic respiratory failure -Continue oxygen supplementation,  currently on 2 L.  Try to wean. -Will be a "poor candidate for mechanical ventilation should he decline -Plan is to continue aggressive measures at present   Metabolic encephalopathy: Completely alert and oriented.   Septic shock-present on admission Source of infection appears to be chronic sacral wound with associated osteomyelitis, possible right lower lobe pneumonia. Has a chronic PICC-placed for osteomyelitis-removed on 12/15/2020 Sacral osteomyelitis April 2022-antibiotics were held May 2022 as patient was discharged to hospice status at the time.  Off of vasopressors since 12/16/2020.  Appreciate ID input. -Recommendation is for 14 days of antibiotics(Merrem and vancomycin)-with last dose on 12/21/2020.   History of diastolic dysfunction on echo on May 2022 Concern for non-ST elevation MI: Troponin significantly elevated 3292 and 2965 on 12/08/2020 however echo done on 12/09/2020 does not show wall motion about it.  Patient has remained hemodynamically stable.  Not a candidate for any further intervention.  Chronic indwelling Foley History of nephrolithiasis -Foley replaced 6/21  Severe protein calorie malnutrition Chronic PEG -Continue tube feeds  Chronic anemia due to chronic illness Acute on chronic anemia due to critical illness, septic shock Mild thrombocytopenia-stable  DVT prophylaxis: heparin injection 5,000 Units Start: 12/12/20 1400 SCDs Start: 12/08/20 1807   Code Status: Full Code  Family Communication:  None present at bedside.  Discussed with daughter yesterday about plan for discharging tomorrow.  Status is: Inpatient  Remains inpatient appropriate because:Inpatient level of care appropriate due to severity of illness  Dispo: The patient is from: SNF  Anticipated d/c is to: SNF              Patient currently is not medically stable to d/c.   Difficult to place patient No        Estimated body mass index is 30.63 kg/m as calculated from the  following:   Height as of this encounter: 5' 7"  (1.702 m).   Weight as of this encounter: 88.7 kg.  Pressure Injury 06/20/20 Vertebral column Lower;Medial Stage 2 -  Partial thickness loss of dermis presenting as a shallow open injury with a red, pink wound bed without slough. (Active)  06/20/20 0130  Location: Vertebral column  Location Orientation: Lower;Medial  Staging: Stage 2 -  Partial thickness loss of dermis presenting as a shallow open injury with a red, pink wound bed without slough.  Wound Description (Comments):   Present on Admission: Yes     Pressure Injury 06/22/20 Sacrum Posterior Stage 4 - Full thickness tissue loss with exposed bone, tendon or muscle. PHYSICAL THERAPY/HYDROTHERAPY (Active)  06/22/20 1400  Location: Sacrum  Location Orientation: Posterior  Staging: Stage 4 - Full thickness tissue loss with exposed bone, tendon or muscle.  Wound Description (Comments): PHYSICAL THERAPY/HYDROTHERAPY  Present on Admission: Yes     Pressure Injury 12/14/20 Head Lower;Posterior Deep Tissue Pressure Injury - Purple or maroon localized area of discolored intact skin or blood-filled blister due to damage of underlying soft tissue from pressure and/or shear. Maroon discoloration, bleedi (Active)  12/14/20 0600  Location: Head  Location Orientation: Lower;Posterior  Staging: Deep Tissue Pressure Injury - Purple or maroon localized area of discolored intact skin or blood-filled blister due to damage of underlying soft tissue from pressure and/or shear.  Wound Description (Comments): Maroon discoloration, bleeding, nonblanchable  Present on Admission:      Nutritional status:  Nutrition Problem: Severe Malnutrition Etiology: chronic illness (Parkinson's disease)   Signs/Symptoms: moderate fat depletion, moderate muscle depletion, severe muscle depletion, edema   Interventions: Tube feeding, Juven, Prostat    Consultants:  None  Procedures:  None  Antimicrobials:   Anti-infectives (From admission, onward)    Start     Dose/Rate Route Frequency Ordered Stop   12/14/20 1200  vancomycin (VANCOREADY) IVPB 1000 mg/200 mL        1,000 mg 200 mL/hr over 60 Minutes Intravenous Every 24 hours 12/13/20 1350 12/21/20 2359   12/10/20 1000  vancomycin (VANCOREADY) IVPB 1250 mg/250 mL  Status:  Discontinued        1,250 mg 166.7 mL/hr over 90 Minutes Intravenous Every 24 hours 12/09/20 1251 12/13/20 1350   12/09/20 1300  meropenem (MERREM) 1 g in sodium chloride 0.9 % 100 mL IVPB        1 g 200 mL/hr over 30 Minutes Intravenous Every 8 hours 12/09/20 1234 12/21/20 2359   12/09/20 1000  vancomycin (VANCOREADY) IVPB 750 mg/150 mL  Status:  Discontinued        750 mg 150 mL/hr over 60 Minutes Intravenous Every 24 hours 12/08/20 1909 12/09/20 1251   12/08/20 2200  ceFEPIme (MAXIPIME) 2 g in sodium chloride 0.9 % 100 mL IVPB  Status:  Discontinued        2 g 200 mL/hr over 30 Minutes Intravenous Every 12 hours 12/08/20 1857 12/09/20 1223   12/08/20 1430  ceFEPIme (MAXIPIME) 2 g in sodium chloride 0.9 % 100 mL IVPB        2 g 200 mL/hr over 30 Minutes Intravenous  Once 12/08/20 1417 12/08/20 1753  12/08/20 1400  vancomycin (VANCOCIN) IVPB 1000 mg/200 mL premix        1,000 mg 200 mL/hr over 60 Minutes Intravenous  Once 12/08/20 1351 12/08/20 1513   12/08/20 1400  aztreonam (AZACTAM) 2 g in sodium chloride 0.9 % 100 mL IVPB  Status:  Discontinued        2 g 200 mL/hr over 30 Minutes Intravenous  Once 12/08/20 1351 12/08/20 1414          Subjective: Seen and examined.  He has no complaints.  Objective: Vitals:   12/19/20 1228 12/19/20 1955 12/20/20 0456 12/20/20 0500  BP: (!) 106/57 104/64 107/60   Pulse: (!) 104 100 99   Resp: 18 16 16    Temp: 98.5 F (36.9 C) 99.4 F (37.4 C) 97.8 F (36.6 C)   TempSrc: Oral Oral Oral   SpO2: 96% 93% 94%   Weight:    88.7 kg  Height:        Intake/Output Summary (Last 24 hours) at 12/20/2020 1314 Last data  filed at 12/20/2020 0511 Gross per 24 hour  Intake 0 ml  Output 2150 ml  Net -2150 ml    Filed Weights   12/18/20 0500 12/19/20 0500 12/20/20 0500  Weight: 89.5 kg 90.5 kg 88.7 kg    Examination:  General exam: Appears calm and comfortable  Respiratory system: Clear to auscultation. Respiratory effort normal. Cardiovascular system: S1 & S2 heard, RRR. No JVD, murmurs, rubs, gallops or clicks. No pedal edema. Gastrointestinal system: Abdomen is nondistended, soft and nontender. No organomegaly or masses felt. Normal bowel sounds heard. Central nervous system: Alert and oriented.  Quadriplegic   Data Reviewed: I have personally reviewed following labs and imaging studies  CBC: Recent Labs  Lab 12/16/20 0325 12/18/20 0924  WBC 13.7* 8.0  NEUTROABS  --  4.8  HGB 7.8* 8.3*  HCT 25.4* 27.8*  MCV 83.3 84.0  PLT 217 707    Basic Metabolic Panel: Recent Labs  Lab 12/14/20 0255 12/16/20 0325 12/18/20 0924  NA 141 137 141  K 3.7 4.4 4.4  CL 104 105 108  CO2 32 28 28  GLUCOSE 142* 124* 131*  BUN 31* 33* 33*  CREATININE 0.45* 0.49* 0.40*  CALCIUM 7.9* 7.6* 8.2*  MG 1.7  --  1.8  PHOS 1.9* 2.9  --     GFR: Estimated Creatinine Clearance: 86.1 mL/min (A) (by C-G formula based on SCr of 0.4 mg/dL (L)). Liver Function Tests: No results for input(s): AST, ALT, ALKPHOS, BILITOT, PROT, ALBUMIN in the last 168 hours.  No results for input(s): LIPASE, AMYLASE in the last 168 hours. No results for input(s): AMMONIA in the last 168 hours. Coagulation Profile: No results for input(s): INR, PROTIME in the last 168 hours. Cardiac Enzymes: No results for input(s): CKTOTAL, CKMB, CKMBINDEX, TROPONINI in the last 168 hours. BNP (last 3 results) No results for input(s): PROBNP in the last 8760 hours. HbA1C: No results for input(s): HGBA1C in the last 72 hours. CBG: Recent Labs  Lab 12/19/20 1958 12/20/20 0014 12/20/20 0459 12/20/20 0713 12/20/20 1132  GLUCAP 125* 111*  111* 110* 97    Lipid Profile: No results for input(s): CHOL, HDL, LDLCALC, TRIG, CHOLHDL, LDLDIRECT in the last 72 hours. Thyroid Function Tests: No results for input(s): TSH, T4TOTAL, FREET4, T3FREE, THYROIDAB in the last 72 hours. Anemia Panel: No results for input(s): VITAMINB12, FOLATE, FERRITIN, TIBC, IRON, RETICCTPCT in the last 72 hours. Sepsis Labs: No results for input(s): PROCALCITON, LATICACIDVEN in the  last 168 hours.  Recent Results (from the past 240 hour(s))  Culture, blood (routine x 2)     Status: None   Collection Time: 12/12/20 10:46 AM   Specimen: BLOOD  Result Value Ref Range Status   Specimen Description   Final    BLOOD PICC LINE Performed at [facility], [place] [address], [place], [place] [address]    Special Requests   Final    BOTTLES DRAWN AEROBIC AND ANAEROBIC Blood Culture adequate volume Performed at [facility] [address] [place], [place] [address]    Culture   Final    NO GROWTH 5 DAYS Performed at [facility], [place] [address] [place], [place] [address]    Report Status 12/17/2020 FINAL  Final  Culture, blood (routine x 2)     Status: None   Collection Time: 12/12/20 10:46 AM   Specimen: BLOOD  Result Value Ref Range Status   Specimen Description   Final    BLOOD BLOOD RIGHT HAND Performed at [facility] [address], [place], [place] [address]    Special Requests   Final    BOTTLES DRAWN AEROBIC AND ANAEROBIC Blood Culture adequate volume Performed at [facility] [address] [place], [place] [address]    Culture   Final    NO GROWTH 5 DAYS Performed at [facility], [place] [address] [place], [place] [address]    Report Status 12/17/2020 FINAL  Final       Radiology Studies: No results found.  Scheduled Meds:  chlorhexidine  15 mL Mouth Rinse BID   Chlorhexidine Gluconate Cloth  6 each Topical Daily   feeding supplement  (OSMOLITE 1.5 CAL)  1,000 mL Per Tube Q24H   feeding supplement (PROSource TF)  90 mL Per Tube BID   free water  200 mL Per Tube Q8H   heparin injection (subcutaneous)  5,000 Units Subcutaneous Q8H   insulin aspart  0-15 Units Subcutaneous Q4H   mouth rinse  15 mL Mouth Rinse q12n4p   midodrine  10 mg Per Tube TID with meals   nutrition supplement (JUVEN)  1 packet Per Tube BID BM   pantoprazole sodium  40 mg Per Tube Daily   pregabalin  100 mg Per Tube BID   Continuous Infusions:  sodium chloride 10 mL/hr at 12/15/20 1851   meropenem (MERREM) IV 1 g (12/20/20 1251)   vancomycin 1,000 mg (12/20/20 0932)     LOS: 12 days   Time spent: 26 minutes   Darliss Cheney, MD [facility]  12/20/2020, 1:14 PM   How to contact the [facility] Attending or Consulting provider [place] or covering provider during after hours [place], for this patient?  Check the care team in [facility] and look for a) attending/consulting TRH provider listed and b) the [facility] team listed. Page or secure chat [phone]. Log into [email or web address] and use [place]'s universal password to access. If you do not have the password, please contact the hospital operator. Locate the [facility] provider you are looking for under [facility] and page to a number that you can be directly reached. If you still have difficulty reaching the provider, please page the [facility] (Director on Call) for the Hospitalists listed on amion for assistance.

## 2020-12-20 NOTE — Plan of Care (Signed)

## 2020-12-20 NOTE — Progress Notes (Signed)
[facility] [address] [facility]) Hospitalized Hospice Patient   Shannon [name] is a current hospice patient with [facility]. He was admitted to hospice services on 10/27/20 with a terminal diagnosis of abnormal weight loss. On 12/08/2020, patient was found by staff at [facility] unresponsive, and hypotensive with SBPs in the 60s. Shannon [name] was transported to [facility] ED for further evaluation. On arrival to the emergency department patient remained in shock and was started on norepinephrine infusion via PICC line and started on sepsis protocol receiving vancomycin and cefepime. Per Dr. Kirt Boys, [facility] MD, this is a related admission.   Patient resting with eyes closed and did not wake to gentle stimulation. Exchanged report with bedside nurse Shannon [name] who reported no changes in patient since yesterday. Plan is still for him to return to [facility] on 6.30 after antibiotic completion. Left VM for daughter.    V/S: 98.6 oral, HR 105, BP 95/64, resp 20, sp02 95% RA I&O: 0/2150 Labs: none today Diagnostics: none today IVs/PRNs: Merem 1g IV TID, vancomycin 1g IV QD   Shannon [name] continues to be appropriate for inpatient services due to treatment of septic shock secondary to osteomyelitis, and suspected RLL PNA.   Problem List:   Quadriplegia/acute hypoxic respiratory failure -Continue oxygen supplementation, currently on 2 L.  Try to wean. -Will be a "poor candidate for mechanical ventilation should he decline -Plan is to continue aggressive measures at present   Metabolic encephalopathy: Completely alert and oriented.   Septic shock-present on admission Source of infection appears to be chronic sacral wound with associated osteomyelitis, possible right lower lobe pneumonia. Has a chronic PICC-placed for osteomyelitis-removed on 12/15/2020 Sacral osteomyelitis April 2022-antibiotics were held May 2022 as patient was discharged to hospice status at the time.  Off of vasopressors  since 12/16/2020.  Appreciate ID input. -Recommendation is for 14 days of antibiotics(Merrem and vancomycin)-with last dose on 12/21/2020.   History of diastolic dysfunction on echo on May 2022 Concern for non-ST elevation MI: Troponin significantly elevated 3292 and 2965 on 12/08/2020 however echo done on 12/09/2020 does not show wall motion about it.  Patient has remained hemodynamically stable.  Not a candidate for any further intervention.   Chronic indwelling Foley History of nephrolithiasis -Foley replaced 6/21   Severe protein calorie malnutrition Chronic PEG -Continue tube feeds   Chronic anemia due to chronic illness Acute on chronic anemia due to critical illness, septic shock Mild thrombocytopenia-stable   Discharge Planning: Back to facility when medically optimized   Family Contact: No family present and daughter did not answer phone. Did leave a VM   IDT: Hospice team updated   Goals of Care: Full code; daughter continues to desire all measures taken to treat any and all conditions.    At time of discharge please use [facility] for transport.  Thank you,  Shannon [name], BSN, [facility] Liaison [phone]

## 2020-12-20 NOTE — Progress Notes (Signed)
Speech Language Pathology Treatment:    Patient Details Name: Shannon [name] MRN: [id number] DOB: 1946-04-26 Today's Date: 12/20/2020 Time: 4496-7591 SLP Time Calculation (min) (ACUTE ONLY): 40 min  Assessment / Plan / Recommendation Clinical Impression  Today pt found with eyes closed in bed, he stated he was meditating.  Pt declined to consume intake offered including sodas, ice cream, jello, water.  He states that he was given "nasty macaroni and cheese" today that left a "bad taste" in his mouth following intake. He does report concerns with tube feeding causing him diarrhea and wants to consume more by mouth.    Today pt has mild dysarthria with both articulation imprecision and decreased phonatory strength *3-4 breathy words per breath group.  Concern for progressive deconditioning with repeated hospital admissions, acute medical events present.    SLP offered to provide pt oral care to which he politely declined but he did accept single ice chips.  Multiple sub-swallows noted per ice chip bolus -without indication of airway compromise.  SLP verbally provided pt with min cue pt to attempt to cough strongly to clear airway/secretions if needed.  He produced volitional weak cough that was not productive.   Pt states with ice chip consumption, he will cough up secretions.    Encouraged pt to attempt to consume some intake to help progress his goal of decreasing tube feeding with min cues and teach back.  Today pt appears with flat affect and decreased participation.  He states he thinks he would get stronger if he could "just go home".     Provided pt with bone pillow to help with neutral head positioning but pt winces, indicating discomfort with any body manipulation - therefore left pillow under his right hand to help with positioning.    Used reverse Trendelenburg of bed to maximize airway protection with po. Anticipate pt's intake will continue to be poor.  He will continue to require  complete nutrition via tube feeding at this time.    Recommend follow up at [facility] for dysphagia management on trial basis to help maximize his po and his QOL.      HPI HPI: 75 yo male adm to [facility] with AMS - septic, hypotensive.  Prior to admit, pt is being followed by hospice and remains a full code.  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  Pt with dysphagia diagnosed post=ACDF via several MBS studies showing silent aspiration - 11/2 and 11/12.  Most recent exam 06/05/2020 showed improvement with swallowing with recommendation to advance to regular/thin.  He received a a PEG tube 06/2020 due to malnutrition.  Pt reports he eats "whatever he wants" at his SNF.  He does endorse increased mucus/coughing not always associated with po intake. At this time, pt is receiving tube feeding for nutrition and otherwise is NPO.      SLP Plan  Continue with current plan of care       Recommendations  Diet recommendations: Thin liquid;Dysphagia 1 (puree) Liquids provided via: Cup;Straw;Teaspoon Medication Administration: Via alternative means Supervision: Full supervision/cueing for compensatory strategies Compensations: Minimize environmental distractions;Slow rate;Small sips/bites Postural Changes and/or Swallow Maneuvers: Seated upright 90 degrees (encourage pt to remain upright after meals to help with airway protection and faciliation of oropharyngeal/esophageal clearance)                Oral Care Recommendations: Oral care  QID Follow up Recommendations: Skilled Nursing facility;24 hour supervision/assistance SLP Visit Diagnosis: Dysphagia, oropharyngeal phase (R13.12) Plan: Continue with current plan of care       GO               Rolena Infante, MS [facility] SLP Acute Rehab Services Office [phone] Pager  [phone]  Chales Abrahams 12/20/2020, 5:21 PM

## 2020-12-21 ENCOUNTER — Ambulatory Visit: Payer: No Typology Code available for payment source | Admitting: Internal Medicine

## 2020-12-21 LAB — SARS CORONAVIRUS 2 (TAT 6-24 HRS): SARS Coronavirus 2: NEGATIVE

## 2020-12-21 LAB — GLUCOSE, CAPILLARY
Glucose-Capillary: 125 mg/dL — ABNORMAL HIGH (ref 70–99)
Glucose-Capillary: 114 mg/dL — ABNORMAL HIGH (ref 70–99)

## 2020-12-21 MED ORDER — OXYCODONE-ACETAMINOPHEN 5-325 MG PO TABS: 1.0000 | ORAL_TABLET | Freq: Two times a day (BID) | ORAL | 0 refills | Status: DC

## 2020-12-21 NOTE — TOC Progression Note (Signed)
Transition of Care [facility]) - Progression Note    Patient Details  Name: Shannon [name] MRN: [id number] Date of Birth: 1945/11/19  Transition of Care [facility] CM/SW Contact  Geni Bers, RN Phone Number: 12/21/2020, 11:07 AM  Clinical Narrative:     Daughter was called with no answer, left VM that pt would transfer back to [facility] today. [facility] called for transport.   Expected Discharge Plan: Skilled Nursing Facility [facility] with hsopice) Barriers to Discharge: Continued Medical Work up  Expected Discharge Plan and Services Expected Discharge Plan: Skilled Nursing Facility [facility] with hsopice)   Discharge Planning Services: CM Consult     Expected Discharge Date: 12/21/20                                     Social Determinants of Health (SDOH) Interventions    Readmission Risk Interventions No flowsheet data found.

## 2020-12-21 NOTE — Plan of Care (Signed)
?  Problem: Clinical Measurements: ?Goal: Will remain free from infection ?Outcome: Progressing ?Goal: Diagnostic test results will improve ?Outcome: Progressing ?Goal: Respiratory complications will improve ?Outcome: Progressing ?  ?

## 2020-12-21 NOTE — Discharge Summary (Signed)
Physician Discharge Summary  Shannon [name] MBT:[id number] DOB: 05-07-1946 DOA: 12/08/2020  PCP: Tammi Sou, MD  Admit date: 12/08/2020 Discharge date: 12/21/2020 30 Day Unplanned Readmission Risk Score    Flowsheet Row ED to Hosp-Admission (Current) from 12/08/2020 in [facility]  30 Day Unplanned Readmission Risk Score (%) 45.45 Filed at 12/21/2020 0801       This score is the patient's risk of an unplanned readmission within 30 days of being discharged (0 -100%). The score is based on dignosis, age, lab data, medications, orders, and past utilization.   Low:  0-14.9   Medium: 15-21.9   High: 22-29.9   Extreme: 30 and above           Admitted From: [facility] Disposition: [facility]  Recommendations for Outpatient Follow-up:  Follow up with PCP in 1-2 weeks Please obtain BMP/CBC in one week Please follow up with your PCP on the following pending results: Unresulted Labs (From admission, onward)    None         Home Health: None Equipment/Devices: None  Discharge Condition: Fair CODE STATUS: Full code Diet recommendation: Dysphagia 1 diet  Subjective: Seen and examined.  He has no complaints.  Brief/Interim Summary: 75 yo PMH quadriplegia, neurogenic bladder, sacral osteomyelitis and stage IV sacral decubitus ulcer , bed bound,  Long  term resident of  [facility], severe protein calorie malnutrition. Hospice at [facility] was initiated early May 2022. Then, with hematuria and hemorrhagic shock  due to traumatic foley at [facility] requiring admission/cysto/blood clot evac/fulguration. Admitted 5/22 - 11/18/20  and discharged back to [facility]. Due to hospice status abx for osteo were stopped (vanc and ertapenem) continued receiving hospice care though as full code. He then presented to ED 12/08/20 after being found unresponsive at facility. on 12/08/2020 found by staff unresponsive, and hypotensive SBPs 60s on EMS arrival which  did not improve with IVF and was started on an epi infusion by EMS. On arrival to the ED patient remained in shock, was started on NE infusion via PICC line (chronic LUE - present on admission) and started on sepsis protocol receiving vanc and cefepime  LA 8.1 WBC 26 Cr 1.61 (baseline 0.4) AG 14 (prior <5) alk phos 226 AST 229 ALT 99 Trop 3292 Ddimer >20,000 . CXR with L PICC, small R pleural effusion . CT chest with RLL pneumonia (aspn v HCA);  Started on pressors and s/p 3L fluid by ER at time of CCM eval 12/08/2020 and more responsive.  He was admitted under ICU. Code status reversed to full code at time of admission.  Never required intubation.  Was eventually transferred to medical floor under [place] on 12/18/2020.  Was on 2 L of oxygen which was weaned down and he has been on room air since 2 days now.  Metabolic encephalopathy: Resolved.  Completely alert and oriented.   Septic shock-present on admission Source of infection appears to be chronic sacral wound with associated osteomyelitis, possible right lower lobe pneumonia. Has a chronic PICC-placed for osteomyelitis-removed on 12/15/2020 Sacral osteomyelitis April 2022-antibiotics were held May 2022 as patient was discharged to hospice status at the time.  Off of vasopressors since 12/16/2020.  Appreciate ID input. -Recommendation is for 14 days of antibiotics(Merrem and vancomycin)-with last dose on 12/21/2020.  Will complete last dose today.   History of diastolic dysfunction on echo on May 2022 Concern for non-ST elevation MI: Troponin significantly elevated 3292 and 2965 on 12/08/2020  however echo done on 12/09/2020 does not show wall motion about it.  Patient has remained hemodynamically stable.  Not a candidate for any further intervention.   Chronic indwelling Foley History of nephrolithiasis -Foley replaced 6/21   Severe protein calorie malnutrition Chronic PEG -Continue tube feeds.  Evaluated by SLP and they recommended dysphagia 1  diet.   Chronic anemia due to chronic illness Acute on chronic anemia due to critical illness, septic shock , never required any blood transfusion. Mild thrombocytopenia-stable  Patient is being transferred back to [facility] today.  Discharge Diagnoses:  Active Problems:   Quadriplegia and quadriparesis (HCC)   Chronic indwelling Foley catheter   Sacral decubitus ulcer   Sacral osteomyelitis (HCC)   Stage IV pressure ulcer of sacral region [facility])   Severe sepsis with septic shock [facility])    Discharge Instructions   Allergies as of 12/21/2020       Reactions   Iodine Swelling   NOT CT CONTRAST   Penicillins Swelling   ** tolerates cephalosporins Facial swelling, itchy throat        Medication List     STOP taking these medications    ertapenem 1 g injection Commonly known as: INVANZ   oxyCODONE 5 MG immediate release tablet Commonly known as: Oxy IR/ROXICODONE   pantoprazole sodium 40 mg/20 mL Pack Commonly known as: PROTONIX   vancomycin 750 MG Solr injection Commonly known as: VANCOCIN       TAKE these medications    albuterol 108 (90 Base) MCG/ACT inhaler Commonly known as: VENTOLIN HFA Inhale 2 puffs into the lungs daily.   AMINO ACIDS-PROTEIN HYDROLYS PO Take by mouth 2 (two) times daily between meals. 17-100 gram/ Kcal/30 mL   (Also known as Pro -Stat)   ascorbic acid 500 MG tablet Commonly known as: VITAMIN C Take 1,000 mg by mouth daily.   aspirin 81 MG chewable tablet Chew 1 tablet (81 mg total) by mouth every morning.   Cranberry 400 MG Caps Take 1 capsule by mouth daily at 6 (six) AM.   docusate 50 MG/5ML liquid Commonly known as: COLACE Place 100 mg into feeding tube 2 (two) times daily.   DULoxetine 30 MG capsule Commonly known as: CYMBALTA Take 30 mg by mouth daily.   feeding supplement (PROSource TF) liquid Place 45 mLs into feeding tube 2 (two) times daily.   feeding supplement (OSMOLITE 1.5 CAL) Liqd Place 1,000 mLs  into feeding tube continuous.   nutrition supplement (JUVEN) Pack Place 1 packet into feeding tube 2 (two) times daily between meals.   Ferrous Sulfate 220 (44 Fe) MG/5ML Liqd Give 5 mLs by tube in the morning, at noon, and at bedtime. Via G tube   midodrine 10 MG tablet Commonly known as: PROAMATINE Place 1 tablet (10 mg total) into feeding tube 3 (three) times daily.   omeprazole 20 MG capsule Commonly known as: PRILOSEC Take 20 mg by mouth every other day. Per Tube   oxyCODONE-acetaminophen 5-325 MG tablet Commonly known as: PERCOCET/ROXICET Place 1 tablet into feeding tube 2 (two) times daily.   polyethylene glycol 17 g packet Commonly known as: MIRALAX / GLYCOLAX Place 17 g into feeding tube daily.   pregabalin 100 MG capsule Commonly known as: LYRICA Take 100 mg by mouth 2 (two) times daily.   Probiotic Acidophilus Caps Place 1 capsule into feeding tube daily.   scopolamine 1 MG/3DAYS Commonly known as: TRANSDERM-SCOP Place 1 patch onto the skin every 3 (three) days.   tamsulosin  0.4 MG Caps capsule Commonly known as: FLOMAX Take 1 capsule (0.4 mg total) by mouth daily.   zinc gluconate 50 MG tablet Take 50 mg by mouth daily.        Follow-up Information     McGowen, Adrian Blackwater, MD Follow up in 1 week(s).   Specialty: Family Medicine Contact information: [address] [place] [address] [phone]                Allergies  Allergen Reactions   Iodine Swelling    NOT CT CONTRAST   Penicillins Swelling    ** tolerates cephalosporins Facial swelling, itchy throat    Consultations: ID and PCCM   Procedures/Studies: CT Head Wo Contrast  Result Date: 12/08/2020 CLINICAL DATA:  Altered mental status EXAM: CT HEAD WITHOUT CONTRAST TECHNIQUE: Contiguous axial images were obtained from the base of the skull through the vertex without intravenous contrast. COMPARISON:  05/21/2020 FINDINGS: Brain: No evidence of acute infarction,  hemorrhage, hydrocephalus, extra-axial collection or mass lesion/mass effect. Cavum septum pellucidum and cavum vergae incidentally noted. Mild low-density changes within the periventricular and subcortical white matter compatible with chronic microvascular ischemic change. Mild diffuse cerebral volume loss. Vascular: Atherosclerotic calcifications involving the large vessels of the skull base. No unexpected hyperdense vessel. Skull: Normal. Negative for fracture or focal lesion. Sinuses/Orbits: Partial bilateral mastoid opacification. Paranasal sinuses are clear. Other: None. IMPRESSION: 1. No acute intracranial findings. 2. 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 3. Partial bilateral mastoid opacification. Electronically Signed   By: Davina Poke D.O.   On: 12/08/2020 17:18   CT Angio Chest PE W and/or Wo Contrast  Result Date: 12/08/2020 CLINICAL DATA:  Abdominal pain.  Fever. EXAM: CT ANGIOGRAPHY CHEST CT ABDOMEN AND PELVIS WITH CONTRAST TECHNIQUE: Multidetector CT imaging of the chest was performed using the standard protocol during bolus administration of intravenous contrast. Multiplanar CT image reconstructions and MIPs were obtained to evaluate the vascular anatomy. Multidetector CT imaging of the abdomen and pelvis was performed using the standard protocol during bolus administration of intravenous contrast. CONTRAST:  76m OMNIPAQUE IOHEXOL 350 MG/ML SOLN COMPARISON:  CT stone study 11/12/2020 FINDINGS: CTA CHEST FINDINGS Cardiovascular: Heart size is normal. Aorta is within normal limits. Pulmonary artery opacification is excellent. No focal filling defects are present to suggest pulmonary emboli. Mediastinum/Nodes: No significant mediastinal, hilar, or axillary adenopathy is present. Esophagus is within normal limits. Thoracic inlet is unremarkable. Lungs/Pleura: Asymmetric right lower lobe airspace disease is present. Small effusions are present bilaterally. There is  some dependent atelectasis on the left. Mild dependent atelectasis is present in the upper lobes bilaterally. No pneumothorax is present. Musculoskeletal: Vertebral body heights and alignment are maintained. Thoracic kyphosis normal. Review of the MIP images confirms the above findings. CT ABDOMEN and PELVIS FINDINGS Hepatobiliary: Gallstone again noted. No inflammatory changes associated. Liver is unremarkable. Pancreas: Unremarkable. No pancreatic ductal dilatation or surrounding inflammatory changes. Spleen: Normal in size without focal abnormality. Adrenals/Urinary Tract: Adrenal glands are normal bilaterally. Bilateral nonobstructing nephrolithiasis is stable. No mass lesion present. The left ureter remains dilated with some inflammation. Stone is present at the UVJ. Foley catheter is present in the urinary bladder. Stomach/Bowel: Peg tube place. Moderate fluid is present within the stomach. Inflammatory change present. Small bowel unremarkable. Terminal ileum within normal limits. Mobile cecum noted. Ascending and transverse colon are within normal limits. Descending colon unremarkable. Diverticula are present in the descending and sigmoid colon without inflammation. Vascular/Lymphatic: Atherosclerotic calcifications are present  in the aorta and branch vessels without aneurysm. No significant adenopathy is present. Reproductive: Prostate is unremarkable. Other: There is some stranding in the subcutaneous fat. No free fluid or free air is present. No significant ventral hernias are present. Musculoskeletal: Endplate changes of the lumbar spine are most pronounced at L1-2 anteriorly. Disc disease at L4-5 contributes to bilateral foraminal narrowing. Moderate degenerative changes are noted in the hips bilaterally. No focal lytic or blastic lesions are present. Review of the MIP images confirms the above findings. IMPRESSION: 1. No pulmonary embolus. 2. Right lower lobe airspace disease, concerning for pneumonia.  3. Small bilateral pleural effusions and associated atelectasis. 4. Stable bilateral nonobstructing nephrolithiasis. 5. Cholelithiasis without evidence for cholecystitis. 6. 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 7. Degenerative changes of the lumbar spine are most pronounced at L1-2 and L4-5. 8. Aortic Atherosclerosis (ICD10-I70.0). Electronically Signed   By: San Morelle M.D.   On: 12/08/2020 17:32   CT Abdomen Pelvis W Contrast  Result Date: 12/08/2020 CLINICAL DATA:  Abdominal pain.  Fever. EXAM: CT ANGIOGRAPHY CHEST CT ABDOMEN AND PELVIS WITH CONTRAST TECHNIQUE: Multidetector CT imaging of the chest was performed using the standard protocol during bolus administration of intravenous contrast. Multiplanar CT image reconstructions and MIPs were obtained to evaluate the vascular anatomy. Multidetector CT imaging of the abdomen and pelvis was performed using the standard protocol during bolus administration of intravenous contrast. CONTRAST:  88m OMNIPAQUE IOHEXOL 350 MG/ML SOLN COMPARISON:  CT stone study 11/12/2020 FINDINGS: CTA CHEST FINDINGS Cardiovascular: Heart size is normal. Aorta is within normal limits. Pulmonary artery opacification is excellent. No focal filling defects are present to suggest pulmonary emboli. Mediastinum/Nodes: No significant mediastinal, hilar, or axillary adenopathy is present. Esophagus is within normal limits. Thoracic inlet is unremarkable. Lungs/Pleura: Asymmetric right lower lobe airspace disease is present. Small effusions are present bilaterally. There is some dependent atelectasis on the left. Mild dependent atelectasis is present in the upper lobes bilaterally. No pneumothorax is present. Musculoskeletal: Vertebral body heights and alignment are maintained. Thoracic kyphosis normal. Review of the MIP images confirms the above findings. CT ABDOMEN and PELVIS FINDINGS Hepatobiliary: Gallstone again noted. No inflammatory changes associated.  Liver is unremarkable. Pancreas: Unremarkable. No pancreatic ductal dilatation or surrounding inflammatory changes. Spleen: Normal in size without focal abnormality. Adrenals/Urinary Tract: Adrenal glands are normal bilaterally. Bilateral nonobstructing nephrolithiasis is stable. No mass lesion present. The left ureter remains dilated with some inflammation. Stone is present at the UVJ. Foley catheter is present in the urinary bladder. Stomach/Bowel: Peg tube place. Moderate fluid is present within the stomach. Inflammatory change present. Small bowel unremarkable. Terminal ileum within normal limits. Mobile cecum noted. Ascending and transverse colon are within normal limits. Descending colon unremarkable. Diverticula are present in the descending and sigmoid colon without inflammation. Vascular/Lymphatic: Atherosclerotic calcifications are present in the aorta and branch vessels without aneurysm. No significant adenopathy is present. Reproductive: Prostate is unremarkable. Other: There is some stranding in the subcutaneous fat. No free fluid or free air is present. No significant ventral hernias are present. Musculoskeletal: Endplate changes of the lumbar spine are most pronounced at L1-2 anteriorly. Disc disease at L4-5 contributes to bilateral foraminal narrowing. Moderate degenerative changes are noted in the hips bilaterally. No focal lytic or blastic lesions are present. Review of the MIP images confirms the above findings. IMPRESSION: 1. No pulmonary embolus. 2. Right lower lobe airspace disease, concerning for pneumonia. 3. Small bilateral pleural effusions and associated atelectasis. 4. Stable bilateral nonobstructing nephrolithiasis. 5. Cholelithiasis  without evidence for cholecystitis. 6. 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 7. Degenerative changes of the lumbar spine are most pronounced at L1-2 and L4-5. 8. Aortic Atherosclerosis (ICD10-I70.0). Electronically Signed   By:  San Morelle M.D.   On: 12/08/2020 17:32   DG Chest Port 1 View  Result Date: 12/09/2020 CLINICAL DATA:  75 year old male with pneumonia. EXAM: PORTABLE CHEST 1 VIEW COMPARISON:  Portable chest 12/08/2020. Chest CT 07/04/2020 and earlier. FINDINGS: Portable AP semi upright view at 0634 hours. Increased veiling opacity at the right lung base. Continued low lung volumes. Stable cardiac size and mediastinal contours. Stable left PICC line. Mild confluent opacity at the medial left base, retrocardiac. No pneumothorax or pulmonary edema. Paucity of bowel gas in the upper abdomen. No acute osseous abnormality identified. Severe chronic changes at the left shoulder. IMPRESSION: 1. Low lung volumes with increasing right lung base veiling opacity more resembling pleural effusion than pneumonia. 2. Probable atelectasis at the left lung base, but infection there also difficult to exclude. Electronically Signed   By: Genevie Ann M.D.   On: 12/09/2020 07:23   DG Chest Port 1 View  Result Date: 12/08/2020 CLINICAL DATA:  Possible sepsis EXAM: PORTABLE CHEST 1 VIEW COMPARISON:  07/04/2020 FINDINGS: Cardiac shadow is mildly enlarged but stable. Left-sided PICC line is noted at the cavoatrial junction. The overall inspiratory effort is poor. Stable small pleural effusion on the right is noted. No acute abnormality noted. IMPRESSION: Chronic changes in the right lung base with small effusion. No focal infiltrate is noted. Electronically Signed   By: Inez Catalina M.D.   On: 12/08/2020 15:09   DG Swallowing Func-Speech Pathology  Result Date: 12/18/2020 Formatting of this result is different from the original. 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 Sherwood Gambler, 1992-->surg at [facility].  Summer 2021->Cervicalgia,arm pain, hand atrophy RUE wkness, hyperreflex (Dr. Rosalita Levan MRI: extensive cord atrophy and spinal and foraminal stenosis->to get surgery 03/2020  Hay fever   Hypertension   Osteoarthritis of both hands  Past Surgical History: Past Surgical History: Procedure Laterality Date  ANTERIOR CERVICAL DECOMP/DISCECTOMY FUSION N/A 04/05/2020  Procedure: ANTERIOR CERVICAL DECOMPRESSION/DISCECTOMY FUSION, INTERBODY PROSTHESIS, PLATE/SCREWS CERVICAL THREE-CERVICAL FOUR, CERVICAL FOUR- CERVICAL FIVE;  Surgeon: Newman Pies, MD;  Location: [facility];  Service: Neurosurgery;  Laterality: N/A;  ANTERIOR CERVICAL DECOMP/DISCECTOMY FUSION N/A 04/22/2020  Procedure: Reexploration of anterior cervical wound for epidural hematoma;  Surgeon: Kary Kos, MD;  Location: [facility];  Service: Neurosurgery;  Laterality: N/A;  BACK SURGERY    CYSTOSCOPY N/A 11/13/2020  Procedure: CYSTOSCOPY, CLOT EVACUATION, FULGURATION;  Surgeon: Robley Fries, MD;  Location: [facility];  Service: Urology;  Laterality: N/A;  INCISION AND DRAINAGE Left 2012  L hand infection  IR GASTROSTOMY TUBE MOD SED  07/05/2020  ROTATOR CUFF REPAIR Right   x 3  SPINE SURGERY   HPI: 75 yo male adm to [facility] with AMS - septic, hypotensive.  Prior to admit, pt is being followed by hospice and remains a full code.  Pt PMH + for Hemorrhagic shock due to massive hematuria/ chronic urinary  retention with admit 10/2020 Chiari Malformation,  s/p ACDF 04/05/2020 with revision 04/22/2020, functional quadriparesis, pleural effusion.  Pt with dysphagia diagnosed post=ACDF via several MBS studies showing silent aspiration - 11/2 and 11/12.  Most recent exam 06/05/2020 showed improvement with swallowing with recommendation to advance to regular/thin.   He received a a PEG tube 06/2020 due to malnutrition.  Pt reports he eats "whatever he wants" at his SNF.  He does endorse increased mucus/coughing not always associated with po intake. At this time, pt is receiving tube feeding for nutrition and otherwise is NPO.  Subjective: awake, alert Assessment / Plan / Recommendation CHL IP CLINICAL IMPRESSIONS 12/18/2020 Clinical Impression Patient presents with a moderate oropharyngeal dysphagia with swallow function significantly changed as [facility] to [facility]. Patient's head posture is tilted back, suspect due to curvature in cervical spine (no radiologist to confirm). Patient exhibited delay in anterior to posterior transit of all boluses, decreased mastication, piecemeal swallowing (noted mostly with thin liquids). With puree solids, regular solids boluses, moderate amount of vallecular sinus residuals and mild amount posterior pharyngeal wall residuals observed however this decreased to minimal with subsequent swallows. Fairly consistent flash penetration (PAS 2) occured with thin liquids during the swallow and one instance of silent penetration to vocal cords (PAS 4) of trace amount which then led to penetrate to pass below vocal cords before then being ejected out. Patient also exhibited prominent cricopharyngeal bar as well as appearance of decreased UES opening and ACDF hardwarem curvature of cervical spine and surrounding tissues appearing to cause narrowing of esophagus. SLP is recommending Dys 1 solids, thin liquids at this time. SLP Visit Diagnosis -- Attention and concentration deficit following -- Frontal lobe and executive function deficit following -- Impact on safety and function --   CHL IP TREATMENT RECOMMENDATION 12/18/2020 Treatment Recommendations Therapy as outlined in treatment plan below   Prognosis 12/18/2020 Prognosis for Safe Diet Advancement Good Barriers to Reach Goals -- Barriers/Prognosis Comment -- CHL IP DIET RECOMMENDATION 12/18/2020 SLP  Diet Recommendations Dysphagia 1 (Puree) solids;Thin liquid Liquid Administration via Cup;Straw Medication Administration Crushed with puree Compensations Minimize environmental distractions;Slow rate;Small sips/bites Postural Changes Seated upright at 90 degrees;Remain semi-upright after after feeds/meals (Comment)   CHL IP OTHER RECOMMENDATIONS 12/18/2020 Recommended Consults -- Oral Care Recommendations Oral care BID;Staff/trained caregiver to provide oral care Other Recommendations --   CHL IP FOLLOW UP RECOMMENDATIONS 12/18/2020 Follow up Recommendations Skilled Nursing facility;24 hour supervision/assistance   CHL IP FREQUENCY AND DURATION 12/18/2020 Speech Therapy Frequency (ACUTE ONLY) min 2x/week Treatment Duration 1 week      CHL IP ORAL PHASE 12/18/2020 Oral Phase Impaired Oral - Pudding Teaspoon -- Oral - Pudding Cup -- Oral - Honey Teaspoon -- Oral - Honey Cup -- Oral - Nectar Teaspoon -- Oral - Nectar Cup -- Oral - Nectar Straw -- Oral - Thin Teaspoon -- Oral - Thin Cup Piecemeal swallowing;Delayed oral transit;Reduced posterior propulsion;Weak lingual manipulation Oral - Thin Straw Weak lingual manipulation;Piecemeal swallowing;Delayed oral transit Oral - Puree Weak lingual manipulation;Delayed oral transit Oral - Mech Soft -- Oral - Regular Weak lingual manipulation;Impaired mastication;Delayed oral transit Oral - Multi-Consistency -- Oral - Pill -- Oral Phase - Comment --  CHL IP PHARYNGEAL PHASE 12/18/2020 Pharyngeal Phase Impaired Pharyngeal- Pudding Teaspoon -- Pharyngeal -- Pharyngeal- Pudding Cup -- Pharyngeal -- Pharyngeal- Honey Teaspoon -- Pharyngeal -- Pharyngeal- Honey Cup -- Pharyngeal -- Pharyngeal- Nectar Teaspoon -- Pharyngeal -- Pharyngeal- Nectar Cup Penetration/Aspiration during swallow;Pharyngeal residue - valleculae;Pharyngeal residue - posterior pharnyx Pharyngeal  Material enters airway, remains ABOVE vocal cords then ejected out Pharyngeal- Nectar Straw -- Pharyngeal -- Pharyngeal-  Thin Teaspoon -- Pharyngeal -- Pharyngeal- Thin Cup Reduced anterior laryngeal mobility;Reduced epiglottic inversion;Reduced laryngeal elevation;Reduced airway/laryngeal closure;Reduced tongue base retraction;Penetration/Aspiration during swallow;Penetration/Apiration after swallow;Pharyngeal residue - valleculae Pharyngeal Material enters airway, remains ABOVE vocal cords then ejected out Pharyngeal- Thin Straw Penetration/Aspiration during swallow;Pharyngeal residue - valleculae;Reduced epiglottic inversion;Reduced anterior laryngeal mobility;Reduced laryngeal elevation;Reduced airway/laryngeal closure;Reduced tongue base retraction Pharyngeal Material enters airway, remains ABOVE vocal cords then ejected out Pharyngeal- Puree Pharyngeal residue - valleculae;Pharyngeal residue - pyriform;Pharyngeal residue - posterior pharnyx Pharyngeal -- Pharyngeal- Mechanical Soft -- Pharyngeal -- Pharyngeal- Regular Pharyngeal residue - valleculae;Pharyngeal residue - pyriform;Reduced pharyngeal peristalsis Pharyngeal -- Pharyngeal- Multi-consistency -- Pharyngeal -- Pharyngeal- Pill -- Pharyngeal -- Pharyngeal Comment --  CHL IP CERVICAL ESOPHAGEAL PHASE 12/18/2020 Cervical Esophageal Phase Impaired Pudding Teaspoon -- Pudding Cup -- Honey Teaspoon -- Honey Cup -- Nectar Teaspoon -- Nectar Cup Reduced cricopharyngeal relaxation;Prominent cricopharyngeal segment Nectar Straw -- Thin Teaspoon -- Thin Cup Reduced cricopharyngeal relaxation;Prominent cricopharyngeal segment Thin Straw Reduced cricopharyngeal relaxation;Prominent cricopharyngeal segment Puree Reduced cricopharyngeal relaxation;Prominent cricopharyngeal segment Mechanical Soft -- Regular Prominent cricopharyngeal segment;Reduced cricopharyngeal relaxation Multi-consistency -- Pill -- Cervical Esophageal Comment -- Sonia Baller, MA, CCC-SLP Speech Therapy             ECHOCARDIOGRAM COMPLETE  Result Date: 12/09/2020    ECHOCARDIOGRAM REPORT   Patient Name:    Shannon [name] Date of Exam: 12/09/2020 Medical Rec #:  [id number]        Height:       67.0 in Accession #:    [id number]       Weight:       195.8 lb Date of Birth:  03-09-1946       BSA:          2.004 m Patient Age:    64 years         BP:           109/64 mmHg Patient Gender: M                HR:           130 bpm. Exam Location:  Inpatient Procedure: 2D Echo, Cardiac Doppler, Color Doppler and Intracardiac            Opacification Agent Indications:    Elevated Troponin  History:        Patient has prior history of Echocardiogram examinations, most                 recent 11/13/2020. Quadriplegia, sacral osteomyelitis,                 Circulatory shock due to septic shock needing pressors, Sinus                 tachycardia, chronic anmeia, pneumonia.  Sonographer:    Darlina Sicilian RDCS Referring Phys: [place]  1. Left ventricular ejection fraction, by estimation, is 60 to 65%. The left ventricle has normal function. Left ventricular endocardial border not optimally defined to evaluate regional wall motion. There is mild left ventricular hypertrophy of the basal-septal segment. Left ventricular diastolic parameters are indeterminate.  2. Right ventricular systolic function is normal. The right ventricular size is normal.  3. The mitral valve is normal in structure. No evidence of mitral valve regurgitation. No evidence of mitral stenosis.  4. The aortic valve is tricuspid. Aortic valve regurgitation  is not visualized. No aortic stenosis is present.  5. Aortic dilatation noted. There is mild dilatation of the aortic root, measuring 40 mm. There is mild dilatation of the ascending aorta, measuring 40 mm.  6. The inferior vena cava is normal in size with greater than 50% respiratory variability, suggesting right atrial pressure of 3 mmHg. FINDINGS  Left Ventricle: Left ventricular ejection fraction, by estimation, is 60 to 65%. The left ventricle has normal function. Left ventricular  endocardial border not optimally defined to evaluate regional wall motion. Definity contrast agent was given IV to delineate the left ventricular endocardial borders. The left ventricular internal cavity size was normal in size. There is mild left ventricular hypertrophy of the basal-septal segment. Left ventricular diastolic parameters are indeterminate. Right Ventricle: The right ventricular size is normal. No increase in right ventricular wall thickness. Right ventricular systolic function is normal. Left Atrium: Left atrial size was normal in size. Right Atrium: Right atrial size was not well visualized. Pericardium: There is no evidence of pericardial effusion. Mitral Valve: The mitral valve is normal in structure. No evidence of mitral valve regurgitation. No evidence of mitral valve stenosis. Tricuspid Valve: The tricuspid valve is not well visualized. Tricuspid valve regurgitation is not demonstrated. No evidence of tricuspid stenosis. Aortic Valve: The aortic valve is tricuspid. Aortic valve regurgitation is not visualized. No aortic stenosis is present. Pulmonic Valve: The pulmonic valve was not well visualized. Pulmonic valve regurgitation is not visualized. No evidence of pulmonic stenosis. Aorta: Aortic dilatation noted. There is mild dilatation of the aortic root, measuring 40 mm. There is mild dilatation of the ascending aorta, measuring 40 mm. Pulmonary Artery: Indeterminant PASP, inadequate TR jet. Venous: The inferior vena cava is normal in size with greater than 50% respiratory variability, suggesting right atrial pressure of 3 mmHg. IAS/Shunts: The interatrial septum was not well visualized.  LEFT VENTRICLE PLAX 2D LVIDd:         5.00 cm LVIDs:         3.50 cm LV PW:         0.70 cm LV IVS:        0.90 cm LVOT diam:     2.50 cm LV SV:         88 LV SV Index:   44 LVOT Area:     4.91 cm  LV Volumes (MOD) LV vol d, MOD A2C: 70.2 ml LV vol d, MOD A4C: 68.9 ml LV vol s, MOD A2C: 35.1 ml LV vol s,  MOD A4C: 37.3 ml LV SV MOD A2C:     35.1 ml LV SV MOD A4C:     68.9 ml LV SV MOD BP:      32.9 ml RIGHT VENTRICLE RV S prime:     15.80 cm/s TAPSE (M-mode): 1.6 cm LEFT ATRIUM           Index LA diam:      3.40 cm 1.70 cm/m LA Vol (A2C): 36.5 ml 18.21 ml/m LA Vol (A4C): 21.1 ml 10.53 ml/m  AORTIC VALVE LVOT Vmax:   133.00 cm/s LVOT Vmean:  93.750 cm/s LVOT VTI:    0.179 m  AORTA Ao Root diam: 4.00 cm Ao Asc diam:  4.00 cm  SHUNTS Systemic VTI:  0.18 m Systemic Diam: 2.50 cm Carlyle Dolly MD Electronically signed by Carlyle Dolly MD Signature Date/Time: 12/09/2020/2:35:03 PM    Final    VAS [place] LOWER EXTREMITY VENOUS (DVT)  Result Date: 12/11/2020  Lower Venous DVT Study Patient  Name:  Shannon [name]  Date of Exam:   12/11/2020 Medical Rec #: [id number]         Accession #:    [id number] Date of Birth: 03/10/1946        Patient Gender: M Patient Age:   74Y Exam Location:  [facility] Procedure:      VAS [place] LOWER EXTREMITY VENOUS (DVT) Referring Phys: [id number] MURALI RAMASWAMY --------------------------------------------------------------------------------  Indications: Concern for HIT.  Limitations: Body habitus and poor ultrasound/tissue interface. Comparison Study: No prior study Performing Technologist: Maudry Mayhew MHA, RDMS, RVT, RDCS  Examination Guidelines: A complete evaluation includes B-mode imaging, spectral Doppler, color Doppler, and power Doppler as needed of all accessible portions of each vessel. Bilateral testing is considered an integral part of a complete examination. Limited examinations for reoccurring indications may be performed as noted. The reflux portion of the exam is performed with the patient in reverse Trendelenburg.  +---------+---------------+---------+-----------+----------+--------------+ RIGHT    CompressibilityPhasicitySpontaneityPropertiesThrombus Aging +---------+---------------+---------+-----------+----------+--------------+ CFV      Full            Yes      Yes                                 +---------+---------------+---------+-----------+----------+--------------+ FV Prox  Full                                                        +---------+---------------+---------+-----------+----------+--------------+ FV Mid   Full                                                        +---------+---------------+---------+-----------+----------+--------------+ FV DistalFull                                                        +---------+---------------+---------+-----------+----------+--------------+ POP      Full           Yes      Yes                                 +---------+---------------+---------+-----------+----------+--------------+   Right Technical Findings: Not visualized segments include SFJ, PFV, PTV, peroneal veins.  +---------+---------------+---------+-----------+----------+--------------+ LEFT     CompressibilityPhasicitySpontaneityPropertiesThrombus Aging +---------+---------------+---------+-----------+----------+--------------+ CFV      Full           Yes      Yes                                 +---------+---------------+---------+-----------+----------+--------------+ SFJ      Full                                                        +---------+---------------+---------+-----------+----------+--------------+  FV Prox  Full                                                        +---------+---------------+---------+-----------+----------+--------------+ FV Mid   Full                                                        +---------+---------------+---------+-----------+----------+--------------+ FV DistalFull                                                        +---------+---------------+---------+-----------+----------+--------------+ PFV      Full                                                         +---------+---------------+---------+-----------+----------+--------------+ POP      Full           Yes      Yes                                 +---------+---------------+---------+-----------+----------+--------------+   Left Technical Findings: Not visualized segments include PTV, peroneal veins.   Summary: BILATERAL: -No evidence of popliteal cyst, bilaterally. RIGHT: - There is no evidence of deep vein thrombosis in the lower extremity. However, portions of this examination were limited- see technologist comments above.  LEFT: - There is no evidence of deep vein thrombosis in the lower extremity. However, portions of this examination were limited- see technologist comments above.  *See table(s) above for measurements and observations. Electronically signed by Ruta Hinds MD on 12/11/2020 at 6:51:59 PM.    Final      Discharge Exam: Vitals:   12/20/20 2115 12/21/20 0432  BP: 122/65 (!) 97/54  Pulse: 98 97  Resp: 17   Temp: 98.7 F (37.1 C) 98.1 F (36.7 C)  SpO2: 97% 96%   Vitals:   12/20/20 1429 12/20/20 2115 12/21/20 0432 12/21/20 0840  BP: (!) 114/54 122/65 (!) 97/54   Pulse: (!) 104 98 97   Resp:  17    Temp:  98.7 F (37.1 C) 98.1 F (36.7 C)   TempSrc:  Oral Oral   SpO2:  97% 96%   Weight:    84.6 kg  Height:        General: Pt is alert, awake, not in acute distress Cardiovascular: RRR, S1/S2 +, no rubs, no gallops Respiratory: CTA bilaterally, no wheezing, no rhonchi Abdominal: Soft, NT, ND, bowel sounds + Extremities: no edema, no cyanosis Neuro: Alert and oriented but with quadriplegia which is chronic.    The results of significant diagnostics from this hospitalization (including imaging, microbiology, ancillary and laboratory) are listed below for reference.     Microbiology: Recent Results (from the past 240 hour(s))  Culture, blood (routine x 2)  Status: None   Collection Time: 12/12/20 10:46 AM   Specimen: BLOOD  Result Value Ref Range Status    Specimen Description   Final    BLOOD PICC LINE Performed at [facility], [place] [address] [place], [place] [address]    Special Requests   Final    BOTTLES DRAWN AEROBIC AND ANAEROBIC Blood Culture adequate volume Performed at [place] [address] [place], [place] [address]    Culture   Final    NO GROWTH 5 DAYS Performed at [facility], [place] [address] [place], [place] [address]    Report Status 12/17/2020 FINAL  Final  Culture, blood (routine x 2)     Status: None   Collection Time: 12/12/20 10:46 AM   Specimen: BLOOD  Result Value Ref Range Status   Specimen Description   Final    BLOOD BLOOD RIGHT HAND Performed at [place] [address], [place], [place] [address]    Special Requests   Final    BOTTLES DRAWN AEROBIC AND ANAEROBIC Blood Culture adequate volume Performed at [place] [address], [place], [place] [address]    Culture   Final    NO GROWTH 5 DAYS Performed at [facility], [place] [address] [place], [place] [address]    Report Status 12/17/2020 FINAL  Final  SARS CORONAVIRUS 2 (TAT 6-24 HRS) Nasopharyngeal Nasopharyngeal Swab     Status: None   Collection Time: 12/20/20  1:34 PM   Specimen: Nasopharyngeal Swab  Result Value Ref Range Status   SARS Coronavirus 2 NEGATIVE NEGATIVE Final    Comment: (NOTE) SARS-CoV-2 target nucleic acids are NOT DETECTED.  The SARS-CoV-2 RNA is generally detectable in upper and lower respiratory specimens during the acute phase of infection. Negative results do not preclude SARS-CoV-2 infection, do not rule out co-infections with other pathogens, and should not be used as the sole basis for treatment or other patient management decisions. Negative results must be combined with clinical observations, patient history, and epidemiological information. The expected result is Negative.  Fact Sheet for  Patients: SugarRoll.be  Fact Sheet for Healthcare Providers: https://www.woods-mathews.com/  This test is not yet approved or cleared by the [place] FDA and  has been authorized for detection and/or diagnosis of SARS-CoV-2 by FDA under an Emergency Use Authorization (EUA). This EUA will remain  in effect (meaning this test can be used) for the duration of the COVID-19 declaration under Se ction 564(b)(1) of the Act, 21 U.S.C. section 360bbb-3(b)(1), unless the authorization is terminated or revoked sooner.  Performed at [facility], [place] [address] [place], [place] [address]      Labs: BNP (last 3 results) Recent Labs    05/23/20 0432 05/24/20 0127 06/30/20 1000  BNP 35.1 32.0 92.9   Basic Metabolic Panel: Recent Labs  Lab 12/16/20 0325 12/18/20 0924  NA 137 141  K 4.4 4.4  CL 105 108  CO2 28 28  GLUCOSE 124* 131*  BUN 33* 33*  CREATININE 0.49* 0.40*  CALCIUM 7.6* 8.2*  MG  --  1.8  PHOS 2.9  --    Liver Function Tests: No results for input(s): AST, ALT, ALKPHOS, BILITOT, PROT, ALBUMIN in the last 168 hours. No results for input(s): LIPASE, AMYLASE in the last 168 hours. No results for input(s): AMMONIA in the last 168 hours. CBC: Recent Labs  Lab 12/16/20 0325 12/18/20 0924  WBC 13.7* 8.0  NEUTROABS  --  4.8  HGB 7.8* 8.3*  HCT 25.4* 27.8*  MCV 83.3 84.0  PLT 217 337   Cardiac Enzymes: No results for input(s): CKTOTAL, CKMB, CKMBINDEX, TROPONINI in the last 168 hours. BNP: Invalid input(s): POCBNP CBG: Recent Labs  Lab 12/20/20 1635 12/20/20 2053 12/20/20 2353 12/21/20 0428 12/21/20 0739  GLUCAP 115* 129* 107* 125* 114*   D-Dimer No results for input(s): DDIMER in the last 72 hours. Hgb A1c No results for input(s): HGBA1C in the last 72 hours. Lipid Profile No results for input(s): CHOL, HDL, LDLCALC, TRIG, CHOLHDL, LDLDIRECT in the last 72 hours. Thyroid function studies No results  for input(s): TSH, T4TOTAL, T3FREE, THYROIDAB in the last 72 hours.  Invalid input(s): FREET3 Anemia work up No results for input(s): VITAMINB12, FOLATE, FERRITIN, TIBC, IRON, RETICCTPCT in the last 72 hours. Urinalysis    Component Value Date/Time   COLORURINE RED (A) 12/08/2020 1351   APPEARANCEUR CLOUDY (A) 12/08/2020 1351   LABSPEC 1.012 12/08/2020 1351   PHURINE 7.0 12/08/2020 1351   GLUCOSEU 50 (A) 12/08/2020 1351   HGBUR MODERATE (A) 12/08/2020 1351   BILIRUBINUR NEGATIVE 12/08/2020 1351   KETONESUR NEGATIVE 12/08/2020 1351   PROTEINUR 100 (A) 12/08/2020 1351   NITRITE NEGATIVE 12/08/2020 1351   LEUKOCYTESUR MODERATE (A) 12/08/2020 1351   Sepsis Labs Invalid input(s): PROCALCITONIN,  WBC,  LACTICIDVEN Microbiology Recent Results (from the past 240 hour(s))  Culture, blood (routine x 2)     Status: None   Collection Time: 12/12/20 10:46 AM   Specimen: BLOOD  Result Value Ref Range Status   Specimen Description   Final    BLOOD PICC LINE Performed at [facility], [place] [address] [place], [place] [address]    Special Requests   Final    BOTTLES DRAWN AEROBIC AND ANAEROBIC Blood Culture adequate volume Performed at [place] [address] [place], [place] [address]    Culture   Final    NO GROWTH 5 DAYS Performed at [facility], [place] [address] [place], [place] [address]    Report Status 12/17/2020 FINAL  Final  Culture, blood (routine x 2)     Status: None   Collection Time: 12/12/20 10:46 AM   Specimen: BLOOD  Result Value Ref Range Status   Specimen Description   Final    BLOOD BLOOD RIGHT HAND Performed at [place] [address] [place], [place] [address]    Special Requests   Final    BOTTLES DRAWN AEROBIC AND ANAEROBIC Blood Culture adequate volume Performed at [facility] [place] [address] [place], [place] [address]    Culture   Final    NO GROWTH 5  DAYS Performed at [facility], [place] [address] [place],  [address]    Report Status 12/17/2020 FINAL  Final  SARS CORONAVIRUS 2 (TAT 6-24 HRS) Nasopharyngeal Nasopharyngeal Swab     Status: None   Collection Time: 12/20/20  1:34 PM   Specimen: Nasopharyngeal Swab  Result Value Ref Range Status   SARS Coronavirus 2 NEGATIVE NEGATIVE Final    Comment: (NOTE) SARS-CoV-2 target nucleic acids are NOT DETECTED.  The SARS-CoV-2 RNA is generally detectable in upper and lower respiratory specimens during the acute phase of infection. Negative results do not preclude SARS-CoV-2 infection, do not rule out co-infections with other pathogens, and should not be used as the sole basis for treatment or other patient management decisions. Negative results  must be combined with clinical observations, patient history, and epidemiological information. The expected result is Negative.  Fact Sheet for Patients: SugarRoll.be  Fact Sheet for Healthcare Providers: https://www.woods-mathews.com/  This test is not yet approved or cleared by the [place] FDA and  has been authorized for detection and/or diagnosis of SARS-CoV-2 by FDA under an Emergency Use Authorization (EUA). This EUA will remain  in effect (meaning this test can be used) for the duration of the COVID-19 declaration under Se ction 564(b)(1) of the Act, 21 U.S.C. section 360bbb-3(b)(1), unless the authorization is terminated or revoked sooner.  Performed at [facility], [place] [address], [place], [place] [address]      Time coordinating discharge: Over 30 minutes  SIGNED:   Darliss Cheney, MD  [facility] 12/21/2020, 10:17 AM  If 7PM-7AM, please contact night-coverage [email or web address]

## 2020-12-21 NOTE — NC FL2 (Signed)
[place] MEDICAID FL2 LEVEL OF CARE SCREENING TOOL     IDENTIFICATION  Patient Name: Shannon [name] Birthdate: 1946-05-16 Sex: male Admission Date (Current Location): 12/08/2020  [facility] and [place] Number:  Producer, television/film/video and Address:  [facility],  [address] [place], [place] [address]      Provider Number: [id number]  Attending Physician Name and Address:  Hughie Closs, MD  Relative Name and Phone Number:  Codner,[name] Daughter work [phone],  [phone]    Current Level of Care: Hospital Recommended Level of Care: Skilled Nursing Facility Prior Approval Number:    Date Approved/Denied:   PASRR Number:    Discharge Plan: SNF    Current Diagnoses: Patient Active Problem List   Diagnosis Date Noted   Severe sepsis with septic shock (HCC) 12/08/2020   Scrotal edema 11/18/2020   Stage IV pressure ulcer of sacral region (HCC) 11/13/2020   Anemia 11/13/2020   Hemorrhagic shock (HCC) 11/13/2020   Gross hematuria 11/12/2020   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   Hematuria 06/19/2020   Sacral decubitus ulcer 06/19/2020   COVID-19 virus infection 06/06/2020 06/19/2020   Palliative care by specialist    Goals of care, counseling/discussion    Protein-calorie malnutrition, severe 05/05/2020   Chronic indwelling Foley catheter 05/03/2020   Quadriplegia and quadriparesis (HCC)    Acute blood loss anemia    Slow transit constipation    Neuropathic pain    Asthma    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    Orientation RESPIRATION BLADDER Height & Weight     Self, Time  Normal Incontinent, Indwelling catheter Weight: 84.6 kg Height:  5\' 7"  (170.2 cm)  BEHAVIORAL SYMPTOMS/MOOD NEUROLOGICAL BOWEL NUTRITION STATUS      Incontinent (Chronic) Diet (please see discharge summary)  AMBULATORY STATUS COMMUNICATION OF NEEDS Skin   Total Care Verbally Other (Comment) (Sacral decubitus ulcer, ossteomyelitis, Stage IV  pressure ulcer)                       Personal Care Assistance Level of Assistance  Bathing, Feeding, Dressing, Total care Bathing Assistance: Maximum assistance Feeding assistance: Maximum assistance Dressing Assistance: Maximum assistance Total Care Assistance: Maximum assistance   Functional Limitations Info  Sight, Hearing, Speech Sight Info: Adequate Hearing Info: Adequate Speech Info: Adequate    SPECIAL CARE FACTORS FREQUENCY                       Contractures Contractures Info: Not present    Additional Factors Info  Code Status, Allergies Code Status Info: FULL Allergies Info: Iodine, Penicillins           Current Medications (12/21/2020):  This is the current hospital active medication list Current Facility-Administered Medications  Medication Dose Route Frequency Provider Last Rate Last Admin   0.9 %  sodium chloride infusion  250 mL Intravenous Continuous 12/23/2020, MD 10 mL/hr at 12/15/20 1851 Infusion Verify at 12/15/20 1851   acetaminophen (TYLENOL) 160 MG/5ML solution 650 mg  650 mg Oral Q6H PRN 12/17/20 D, NP   650 mg at 12/20/20 2111   chlorhexidine (PERIDEX) 0.12 % solution 15 mL  15 mL Mouth Rinse BID 2112, MD   15 mL at 12/21/20 0933   Chlorhexidine Gluconate Cloth 2 % PADS 6 each  6 each Topical Daily 12/23/20, MD   6 each at 12/21/20 0851   docusate sodium (COLACE) capsule 100 mg  100 mg Oral  BID PRN Cloyd Stagers M, PA-C       feeding supplement (OSMOLITE 1.5 CAL) liquid 1,000 mL  1,000 mL Per Tube Q24H Leslye Peer, MD 60 mL/hr at 12/20/20 2117 1,000 mL at 12/20/20 2117   feeding supplement (PROSource TF) liquid 90 mL  90 mL Per Tube BID Leslye Peer, MD   90 mL at 12/21/20 0933   free water 200 mL  200 mL Per Tube Q8H Leslye Peer, MD   200 mL at 12/21/20 0600   heparin injection 5,000 Units  5,000 Units Subcutaneous Q8H Leslye Peer, MD   5,000 Units at 12/21/20 0701   insulin aspart  (novoLOG) injection 0-15 Units  0-15 Units Subcutaneous Q4H Janeann Forehand D, NP   2 Units at 12/21/20 0433   MEDLINE mouth rinse  15 mL Mouth Rinse q12n4p Kalman Shan, MD   15 mL at 12/20/20 1816   meropenem (MERREM) 1 g in sodium chloride 0.9 % 100 mL IVPB  1 g Intravenous Q8H Pahwani, Ravi, MD 200 mL/hr at 12/21/20 0439 1 g at 12/21/20 0439   midodrine (PROAMATINE) tablet 10 mg  10 mg Per Tube TID with meals Cloyd Stagers M, PA-C   10 mg at 12/21/20 2409   nutrition supplement (JUVEN) (JUVEN) powder packet 1 packet  1 packet Per Tube BID BM Leslye Peer, MD   1 packet at 12/20/20 2057   pantoprazole sodium (PROTONIX) 40 mg/20 mL oral suspension 40 mg  40 mg Per Tube Daily Cloyd Stagers M, PA-C   40 mg at 12/21/20 0933   polyethylene glycol (MIRALAX / GLYCOLAX) packet 17 g  17 g Oral Daily PRN Cloyd Stagers M, PA-C       pregabalin (LYRICA) capsule 100 mg  100 mg Per Tube BID Leslye Peer, MD   100 mg at 12/21/20 0931   vancomycin (VANCOREADY) IVPB 1000 mg/200 mL  1,000 mg Intravenous Q24H Hughie Closs, MD 200 mL/hr at 12/21/20 0929 1,000 mg at 12/21/20 7353     Discharge Medications: Please see discharge summary for a list of discharge medications.  Relevant Imaging Results:  Relevant Lab Results:   Additional Information SS#[id number]  Geni Bers, RN

## 2021-01-04 ENCOUNTER — Ambulatory Visit: Payer: No Typology Code available for payment source | Admitting: Internal Medicine

## 2021-01-19 ENCOUNTER — Other Ambulatory Visit: Payer: Self-pay

## 2021-01-19 ENCOUNTER — Encounter (HOSPITAL_COMMUNITY): Payer: Self-pay

## 2021-01-19 ENCOUNTER — Ambulatory Visit (HOSPITAL_COMMUNITY)
Admission: RE | Admit: 2021-01-19 | Discharge: 2021-01-19 | Disposition: A | Discharge status: Home / Self Care | Source: Ambulatory Visit | Attending: Interventional Radiology | Admitting: Interventional Radiology

## 2021-01-19 ENCOUNTER — Emergency Department (HOSPITAL_COMMUNITY)
Admission: EM | Admit: 2021-01-19 | Discharge: 2021-01-19 | Disposition: A | Discharge status: Home / Self Care | Attending: Emergency Medicine | Admitting: Emergency Medicine

## 2021-01-19 ENCOUNTER — Other Ambulatory Visit (HOSPITAL_COMMUNITY): Payer: Self-pay | Admitting: Interventional Radiology

## 2021-01-19 DIAGNOSIS — Z7951 Long term (current) use of inhaled steroids: Secondary | ICD-10-CM | POA: Diagnosis not present

## 2021-01-19 DIAGNOSIS — Y828 Other medical devices associated with adverse incidents: Secondary | ICD-10-CM | POA: Insufficient documentation

## 2021-01-19 DIAGNOSIS — T85528A Displacement of other gastrointestinal prosthetic devices, implants and grafts, initial encounter: Secondary | ICD-10-CM | POA: Diagnosis not present

## 2021-01-19 DIAGNOSIS — I10 Essential (primary) hypertension: Secondary | ICD-10-CM | POA: Insufficient documentation

## 2021-01-19 DIAGNOSIS — Z8616 Personal history of COVID-19: Secondary | ICD-10-CM | POA: Insufficient documentation

## 2021-01-19 DIAGNOSIS — J45909 Unspecified asthma, uncomplicated: Secondary | ICD-10-CM | POA: Diagnosis not present

## 2021-01-19 DIAGNOSIS — Z7982 Long term (current) use of aspirin: Secondary | ICD-10-CM | POA: Diagnosis not present

## 2021-01-19 DIAGNOSIS — Z87891 Personal history of nicotine dependence: Secondary | ICD-10-CM | POA: Diagnosis not present

## 2021-01-19 DIAGNOSIS — K9423 Gastrostomy malfunction: Secondary | ICD-10-CM | POA: Diagnosis present

## 2021-01-19 DIAGNOSIS — K9420 Gastrostomy complication, unspecified: Secondary | ICD-10-CM

## 2021-01-19 DIAGNOSIS — T85598A Other mechanical complication of other gastrointestinal prosthetic devices, implants and grafts, initial encounter: Secondary | ICD-10-CM

## 2021-01-19 HISTORY — PX: IR REPLC GASTRO/COLONIC TUBE PERCUT W/FLUORO: IMG2333

## 2021-01-19 IMAGING — XA IR REPLACE G-TUBE/COLONIC TUBE
1 series · 1 of 1 positions shown · non-contrast
Comparison: None.

INDICATION: Dislodged gastrostomy tube.  Originally placed [DATE]

EXAM:
FLUOROSCOPIC GUIDED REPLACEMENT OF GASTROSTOMY TUBE

[Series 1: fl (-) angio · 1 of 1 slices shown]
[im 1/1]
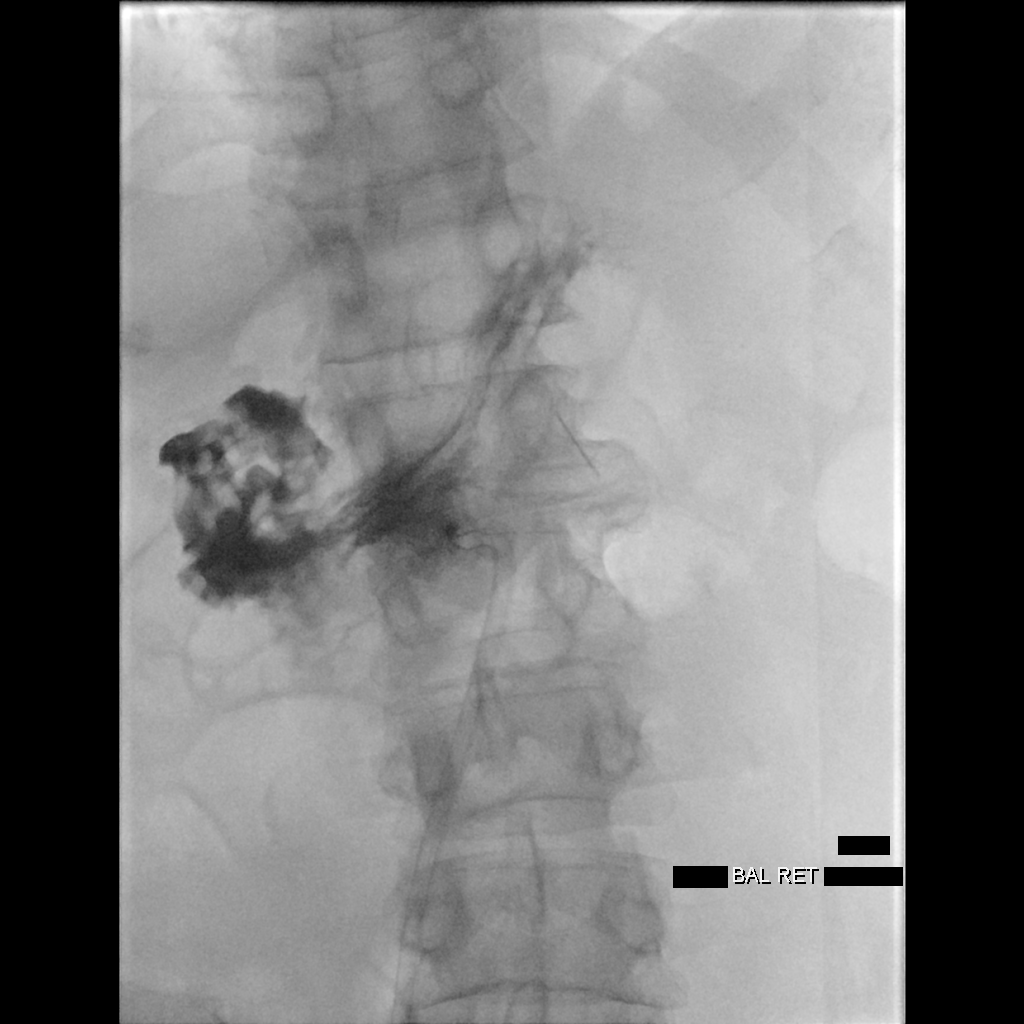

[1 of 1 positions shown; findings below may reference images not displayed]

MEDICATIONS:
None.

CONTRAST:  15mL OMNIPAQUE IOHEXOL 300 MG/ML SOLN - administered into
the gastric lumen

FLUOROSCOPY TIME:  12 seconds

COMPLICATIONS:
None immediate.

PROCEDURE:
Informed written consent was obtained from the patient after a
discussion of the risks, benefits and alternatives to treatment.
Questions regarding the procedure were encouraged and answered. A
timeout was performed prior to the initiation of the procedure.

The upper abdomen and external portion of the existing gastrostomy
tube was prepped and draped in the usual sterile fashion, and a
sterile drape was applied covering the operative field. Maximum
barrier sterile technique with sterile gowns and gloves were used
for the procedure. A timeout was performed prior to the initiation
of the procedure.

The existing gastrostomy tube had been completely removed and
dislodged from the tract. Replacement gastrostomy tube insertion was
initially met with resistance. Next, the tract was cannulated with a
Kumpe the catheter, followed by a stiff Glidewire which was coiled
within the gastric fundus. Brief contrast injection confirmed
intragastric location. Over the stiff guide wire, a new 20-French
balloon inflatable gastrostomy tube was inserted. The balloon was
inflated with saline and dilute contrast and pulled against the
anterior inner lumen of the stomach and the external disc was
cinched.

Contrast was injected and a post procedural spot fluoroscopic image
was obtained confirming appropriate positioning and functionality of
the new gastrostomy tube. A dressing was applied. The patient
tolerated the procedure well without immediate postprocedural
complication.
IMPRESSION: Successful fluoroscopic guided replacement of a new 20-French
balloon retention gastrostomy tube. The gastrostomy tube is ready
for immediate use.

=

## 2021-01-19 MED ORDER — IOHEXOL 300 MG/ML  SOLN
50.0000 mL | Freq: Once | INTRAMUSCULAR | Status: AC | PRN
  Administered 2021-01-19: 15 mL

## 2021-01-19 NOTE — Procedures (Signed)
Successful image guided replacement of 20 Fr balloon retention gastrostomy tube. No complications.  Brayton El PA-C Interventional Radiology 01/19/2021 11:42 AM

## 2021-01-19 NOTE — Discharge Instructions (Addendum)
To be Called from interventional radiology during the day, please keep NPO for procedure

## 2021-01-19 NOTE — ED Notes (Signed)
Ptar called 

## 2021-01-19 NOTE — ED Provider Notes (Signed)
[facility] EMERGENCY DEPARTMENT Provider Note   CSN: [id number] Arrival date & time: 01/19/21  0305     History Chief Complaint  Patient presents with   G-tube removed    Shannon [name] is a 75 y.o. male.  The history is provided by the patient.  Illness Location:  Abdomen Quality:  G tube Severity:  Moderate Onset quality:  Sudden Duration: hours. 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 Newell Coral, 1992-->surg at [facility].  Summer 2021->Cervicalgia,arm pain, hand atrophy RUE wkness, hyperreflex (Dr. 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   Hematuria 06/19/2020   Sacral decubitus ulcer 06/19/2020   COVID-19 virus infection 06/06/2020 06/19/2020   Palliative care by specialist    Goals of care, counseling/discussion    Protein-calorie malnutrition, severe 05/05/2020   Chronic indwelling Foley catheter 05/03/2020   Quadriplegia and quadriparesis (HCC)    Acute blood loss anemia    Slow transit constipation    Neuropathic pain    Asthma    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    Past Surgical History:  Procedure Laterality Date   ANTERIOR CERVICAL DECOMP/DISCECTOMY FUSION N/A 04/05/2020   Procedure: ANTERIOR CERVICAL DECOMPRESSION/DISCECTOMY FUSION, INTERBODY PROSTHESIS, PLATE/SCREWS CERVICAL THREE-CERVICAL FOUR, CERVICAL FOUR- CERVICAL FIVE;  Surgeon: Tressie Stalker, MD;  Location: [facility] OR;  Service: Neurosurgery;  Laterality: N/A;   ANTERIOR CERVICAL DECOMP/DISCECTOMY FUSION N/A 04/22/2020   Procedure: Reexploration of anterior cervical wound for epidural hematoma;  Surgeon: Donalee Citrin, MD;  Location: [facility] OR;  Service: Neurosurgery;  Laterality: N/A;   BACK SURGERY     CYSTOSCOPY N/A 11/13/2020   Procedure: CYSTOSCOPY, CLOT EVACUATION, FULGURATION;  Surgeon: Noel Christmas, MD;  Location: [facility] ORS;  Service: Urology;  Laterality: N/A;   INCISION AND DRAINAGE Left 2012   L hand infection   IR GASTROSTOMY TUBE MOD SED  07/05/2020   ROTATOR CUFF REPAIR Right    x 3   SPINE SURGERY         Family History  Problem Relation Age of Onset   Heart attack Mother    Heart disease Mother    High blood pressure Mother    Alcohol abuse Father    Cancer Father    Diabetes Father    High blood pressure Father     Social History   Tobacco Use   Smoking status: Former   Smokeless tobacco: Never  Building services engineer Use: Never used  Substance Use Topics   Alcohol use: Not Currently   Drug use: Never    Home Medications Prior  to Admission medications   Medication Sig Start Date End Date Taking? Authorizing Provider  albuterol (VENTOLIN HFA) 108 (90 Base) MCG/ACT inhaler Inhale 2 puffs into the lungs daily.    [provider]  AMINO ACIDS-PROTEIN HYDROLYS PO Take by mouth 2 (two) times daily between meals. 17-100 gram/ Kcal/30 mL   (Also known as Pro -Stat)    [provider]  ascorbic acid (VITAMIN C) 500 MG tablet Take 1,000 mg by mouth daily.    [provider]  aspirin 81 MG chewable tablet Chew 1  tablet (81 mg total) by mouth every morning. 12/02/20   Rodolph Bong, MD  Cranberry 400 MG CAPS Take 1 capsule by mouth daily at 6 (six) AM.    [provider]  docusate (COLACE) 50 MG/5ML liquid Place 100 mg into feeding tube 2 (two) times daily.    [provider]  DULoxetine (CYMBALTA) 30 MG capsule Take 30 mg by mouth daily.    [provider]  Ferrous Sulfate 220 (44 Fe) MG/5ML LIQD Give 5 mLs by tube in the morning, at noon, and at bedtime. Via G tube    [provider]  Lactobacillus (PROBIOTIC ACIDOPHILUS) CAPS Place 1 capsule into feeding tube daily.    [provider]  midodrine (PROAMATINE) 10 MG tablet Place 1 tablet (10 mg total) into feeding tube 3 (three) times daily. 11/18/20   Rodolph Bong, MD  nutrition supplement, JUVEN, [employer]) PACK Place 1 packet into feeding tube 2 (two) times daily between meals. 11/18/20   Rodolph Bong, MD  Nutritional Supplements (FEEDING SUPPLEMENT, OSMOLITE 1.5 CAL,) LIQD Place 1,000 mLs into feeding tube continuous. 11/18/20   Rodolph Bong, MD  Nutritional Supplements (FEEDING SUPPLEMENT, PROSOURCE TF,) liquid Place 45 mLs into feeding tube 2 (two) times daily. 11/18/20   Rodolph Bong, MD  omeprazole (PRILOSEC) 20 MG capsule Take 20 mg by mouth every other day. Per Tube    [provider]  oxyCODONE-acetaminophen (PERCOCET/ROXICET) 5-325 MG tablet Place 1 tablet into feeding tube 2 (two) times daily. 12/21/20   Hughie Closs, MD  polyethylene glycol (MIRALAX / GLYCOLAX) 17 g packet Place 17 g into feeding tube daily.    [provider]  pregabalin (LYRICA) 100 MG capsule Take 100 mg by mouth 2 (two) times daily.    [provider]  scopolamine (TRANSDERM-SCOP) 1 MG/3DAYS Place 1 patch onto the skin every 3 (three) days.    [provider]  tamsulosin (FLOMAX) 0.4 MG CAPS capsule Take 1 capsule (0.4 mg total) by mouth daily. 04/28/20   Tressie Stalker, MD   zinc gluconate 50 MG tablet Take 50 mg by mouth daily.    [provider]    Allergies    Iodine and Penicillins  Review of Systems   Review of Systems  Constitutional:  Negative for fever.  HENT:  Negative for rhinorrhea.   Eyes:  Negative for redness.  Respiratory:  Negative for wheezing.   Cardiovascular:  Negative for leg swelling.  Gastrointestinal:  Negative for vomiting.  Skin:  Negative for rash.  Neurological:  Negative for facial asymmetry.  Psychiatric/Behavioral:  Negative for agitation.   All other systems reviewed and are negative.  Physical Exam Updated Vital Signs BP 119/84   Pulse 99   Temp 99.1 F (37.3 C) (Oral)   Resp 17   Ht 5\' 11"  (1.803 m)   Wt 86.6 kg   SpO2 98%   BMI 26.64 kg/m  Physical Exam Vitals and nursing note reviewed. Exam conducted with a chaperone present.  Constitutional:      Appearance: Normal appearance. He is not diaphoretic.  HENT:     Head: Normocephalic and atraumatic.     Nose: Nose normal.  Eyes:     Extraocular Movements: Extraocular movements intact.     Conjunctiva/sclera: Conjunctivae normal.  Cardiovascular:     Rate and Rhythm: Normal rate and regular rhythm.     Pulses: Normal pulses.     Heart sounds: Normal heart sounds.  Pulmonary:     Effort: Pulmonary effort is normal.     Breath sounds: Normal breath sounds.  Abdominal:     General: Abdomen is flat. Bowel sounds are normal.     Palpations: Abdomen is soft.     Tenderness: There is no abdominal tenderness. There is no guarding or rebound.     Comments: Wound at G tube site   Musculoskeletal:     Cervical back: Normal range of motion and neck supple.     Right lower leg: No edema.     Left lower leg: No edema.  Skin:    General: Skin is warm and dry.     Capillary Refill: Capillary refill takes less than 2 seconds.  Neurological:     General: No focal deficit present.     Mental Status: He is alert.  Psychiatric:        Mood and  Affect: Mood normal.        Behavior: Behavior normal.    ED Results / Procedures / Treatments   Labs (all labs ordered are listed, but only abnormal results are displayed) Labs Reviewed - No data to display  EKG None  Radiology No results found.  Procedures Procedures   Medications Ordered in ED Medications - No data to display  ED Course  I have reviewed the triage vital signs and the nursing notes.  Pertinent labs & imaging results that were available during my care of the patient were reviewed by me and considered in my medical decision making (see chart for details).  Attempted to place foley in the ED to stent opening but catheter will not pass.   I suspect the G tube may have been out for some hours.    IR consult placed in Epic for G tube  IR PA number called and message left as directed by Dr. Margo AyeHall of radiology.  Patient's name med rec DOB and [facility] was noted on message  Shanda BumpsJessica of hospice was made aware patient was in the ED and hospice will also follow up to ensure G tube is replaced.    Will D/c to facility for outpatient G tube placement  Shannon [name] was evaluated in Emergency Department on 01/19/2021 for the symptoms described in the history of present illness. He was evaluated in the context of the global COVID-19 pandemic, which necessitated consideration that the patient might be at risk for infection with the SARS-CoV-2 virus that causes COVID-19. Institutional protocols and algorithms that pertain to the evaluation of patients at risk for COVID-19 are in a state of rapid change based on information released by regulatory bodies including the CDC and federal and state organizations. These policies and algorithms were followed during the patient's care in the ED.    Final Clinical Impression(s) / ED Diagnoses Final diagnoses:  None    Return for intractable cough, coughing up blood, fevers > 100.4 unrelieved by medication, shortness of breath,  intractable vomiting, chest pain, shortness of breath, weakness, numbness, changes in speech, facial asymmetry, abdominal pain, passing out, Inability to tolerate liquids or food, cough, altered mental status or any concerns. No signs of systemic illness or infection. The patient is nontoxic-appearing on exam and vital signs are within normal limits. I have reviewed the triage vital signs and the nursing notes. Pertinent labs & imaging results that were available during my care of the patient were reviewed by me and considered in my medical decision making (see chart for details). After history, exam, and medical workup I feel the patient has been appropriately medically screened and is safe for discharge home. Pertinent diagnoses were discussed with the patient. Patient was given return precautions. Rx / DC Orders ED Discharge Orders     None        Ahmarion Saraceno, MD 01/19/21 9201

## 2021-01-19 NOTE — ED Triage Notes (Signed)
BIB EMS from [facility]. Pt found with g-tube out. Upon arrival pt c/o left leg pain.

## 2021-02-01 ENCOUNTER — Other Ambulatory Visit: Payer: Self-pay

## 2021-02-01 ENCOUNTER — Encounter (HOSPITAL_COMMUNITY): Payer: Self-pay

## 2021-02-01 ENCOUNTER — Inpatient Hospital Stay (HOSPITAL_COMMUNITY)
Admission: EM | Admit: 2021-02-01 | Discharge: 2021-02-03 | DRG: 698 | Disposition: A | Discharge status: Skilled Nursing Facility | Source: Skilled Nursing Facility | Attending: Internal Medicine | Admitting: Internal Medicine

## 2021-02-01 DIAGNOSIS — R319 Hematuria, unspecified: Secondary | ICD-10-CM | POA: Diagnosis not present

## 2021-02-01 DIAGNOSIS — Z79899 Other long term (current) drug therapy: Secondary | ICD-10-CM

## 2021-02-01 DIAGNOSIS — E43 Unspecified severe protein-calorie malnutrition: Secondary | ICD-10-CM | POA: Diagnosis present

## 2021-02-01 DIAGNOSIS — N319 Neuromuscular dysfunction of bladder, unspecified: Secondary | ICD-10-CM | POA: Diagnosis present

## 2021-02-01 DIAGNOSIS — D62 Acute posthemorrhagic anemia: Secondary | ICD-10-CM | POA: Diagnosis present

## 2021-02-01 DIAGNOSIS — Z833 Family history of diabetes mellitus: Secondary | ICD-10-CM

## 2021-02-01 DIAGNOSIS — Z20822 Contact with and (suspected) exposure to covid-19: Secondary | ICD-10-CM | POA: Diagnosis present

## 2021-02-01 DIAGNOSIS — Z888 Allergy status to other drugs, medicaments and biological substances status: Secondary | ICD-10-CM

## 2021-02-01 DIAGNOSIS — R310 Gross hematuria: Secondary | ICD-10-CM | POA: Diagnosis present

## 2021-02-01 DIAGNOSIS — Z8249 Family history of ischemic heart disease and other diseases of the circulatory system: Secondary | ICD-10-CM

## 2021-02-01 DIAGNOSIS — Z960 Presence of urogenital implants: Secondary | ICD-10-CM | POA: Diagnosis present

## 2021-02-01 DIAGNOSIS — R1030 Lower abdominal pain, unspecified: Secondary | ICD-10-CM | POA: Diagnosis present

## 2021-02-01 DIAGNOSIS — L89626 Pressure-induced deep tissue damage of left heel: Secondary | ICD-10-CM | POA: Diagnosis present

## 2021-02-01 DIAGNOSIS — J45909 Unspecified asthma, uncomplicated: Secondary | ICD-10-CM | POA: Diagnosis present

## 2021-02-01 DIAGNOSIS — Z880 Allergy status to penicillin: Secondary | ICD-10-CM

## 2021-02-01 DIAGNOSIS — Z981 Arthrodesis status: Secondary | ICD-10-CM

## 2021-02-01 DIAGNOSIS — T8383XA Hemorrhage of genitourinary prosthetic devices, implants and grafts, initial encounter: Secondary | ICD-10-CM | POA: Diagnosis not present

## 2021-02-01 DIAGNOSIS — Y846 Urinary catheterization as the cause of abnormal reaction of the patient, or of later complication, without mention of misadventure at the time of the procedure: Secondary | ICD-10-CM | POA: Diagnosis present

## 2021-02-01 DIAGNOSIS — Z87891 Personal history of nicotine dependence: Secondary | ICD-10-CM

## 2021-02-01 DIAGNOSIS — L89154 Pressure ulcer of sacral region, stage 4: Secondary | ICD-10-CM | POA: Diagnosis present

## 2021-02-01 DIAGNOSIS — Z8616 Personal history of COVID-19: Secondary | ICD-10-CM

## 2021-02-01 DIAGNOSIS — M19042 Primary osteoarthritis, left hand: Secondary | ICD-10-CM | POA: Diagnosis present

## 2021-02-01 DIAGNOSIS — Z66 Do not resuscitate: Secondary | ICD-10-CM | POA: Diagnosis present

## 2021-02-01 DIAGNOSIS — G8250 Quadriplegia, unspecified: Secondary | ICD-10-CM | POA: Diagnosis present

## 2021-02-01 DIAGNOSIS — G935 Compression of brain: Secondary | ICD-10-CM | POA: Diagnosis present

## 2021-02-01 DIAGNOSIS — M19041 Primary osteoarthritis, right hand: Secondary | ICD-10-CM | POA: Diagnosis present

## 2021-02-01 DIAGNOSIS — Z7401 Bed confinement status: Secondary | ICD-10-CM

## 2021-02-01 DIAGNOSIS — Z7982 Long term (current) use of aspirin: Secondary | ICD-10-CM

## 2021-02-01 DIAGNOSIS — G950 Syringomyelia and syringobulbia: Secondary | ICD-10-CM | POA: Diagnosis present

## 2021-02-01 LAB — CBC WITH DIFFERENTIAL/PLATELET
Abs Immature Granulocytes: 0.04 10*3/uL (ref 0.00–0.07)
Basophils Absolute: 0.1 10*3/uL (ref 0.0–0.1)
Basophils Relative: 1 %
Eosinophils Absolute: 1.2 10*3/uL — ABNORMAL HIGH (ref 0.0–0.5)
Eosinophils Relative: 13 %
HCT: 28.9 % — ABNORMAL LOW (ref 39.0–52.0)
Hemoglobin: 8.4 g/dL — ABNORMAL LOW (ref 13.0–17.0)
Immature Granulocytes: 0 %
Lymphocytes Relative: 13 %
Lymphs Abs: 1.2 10*3/uL (ref 0.7–4.0)
MCH: 23.2 pg — ABNORMAL LOW (ref 26.0–34.0)
MCHC: 29.1 g/dL — ABNORMAL LOW (ref 30.0–36.0)
MCV: 79.8 fL — ABNORMAL LOW (ref 80.0–100.0)
Monocytes Absolute: 1.1 10*3/uL — ABNORMAL HIGH (ref 0.1–1.0)
Monocytes Relative: 12 %
Neutro Abs: 5.7 10*3/uL (ref 1.7–7.7)
Neutrophils Relative %: 61 %
Platelets: 263 10*3/uL (ref 150–400)
RBC: 3.62 MIL/uL — ABNORMAL LOW (ref 4.22–5.81)
RDW: 16.1 % — ABNORMAL HIGH (ref 11.5–15.5)
WBC: 9.3 10*3/uL (ref 4.0–10.5)
nRBC: 0 % (ref 0.0–0.2)

## 2021-02-01 LAB — BASIC METABOLIC PANEL
Anion gap: 7 (ref 5–15)
BUN: 31 mg/dL — ABNORMAL HIGH (ref 8–23)
CO2: 31 mmol/L (ref 22–32)
Calcium: 9.2 mg/dL (ref 8.9–10.3)
Chloride: 101 mmol/L (ref 98–111)
Creatinine, Ser: 0.45 mg/dL — ABNORMAL LOW (ref 0.61–1.24)
GFR, Estimated: >60 mL/min (ref >60)
Glucose, Bld: 131 mg/dL — ABNORMAL HIGH (ref 70–99)
Potassium: 3.8 mmol/L (ref 3.5–5.1)
Sodium: 139 mmol/L (ref 135–145)

## 2021-02-01 LAB — PROTIME-INR
INR: 1.1 (ref 0.8–1.2)
Prothrombin Time: 14.7 seconds (ref 11.4–15.2)

## 2021-02-01 MED ORDER — MORPHINE SULFATE (PF) 4 MG/ML IV SOLN
4.0000 mg | Freq: Once | INTRAVENOUS | Status: AC
  Administered 2021-02-01: 4 mg via INTRAVENOUS
  Filled 2021-02-01: qty 1

## 2021-02-01 MED ORDER — SODIUM CHLORIDE 0.9 % IR SOLN
3000.0000 mL | Status: DC
  Administered 2021-02-01 – 2021-02-03 (×3): 3000 mL

## 2021-02-01 MED ORDER — SODIUM CHLORIDE 0.9 % IV SOLN: INTRAVENOUS | Status: DC

## 2021-02-01 NOTE — ED Triage Notes (Signed)
Patient BIB [facility] from [facility]. Presents with bleeding from urinary catheter, been a month since last time it happened again. Hour ago blood clots coming from it. Also elevated BP.  EMS vitals 150/85 HR 110 RR 18 O2 96% 176 CBG 98.6F

## 2021-02-01 NOTE — ED Provider Notes (Signed)
[facility]-EMERGENCY DEPT Provider Note   CSN: [id number] Arrival date & time: 02/01/21  2144     History Chief Complaint  Patient presents with   Bleeding from Catheter    Broderic Bara is a 75 y.o. male.  HPI  Patient presents to the ED for evaluation of bleeding from the urethral catheter.  Patient had an episode back in May of this year requiring admission to the hospital for hematuria.  At that time patient developed hemorrhagic shock and required cystoscopy, evacuation of blood clot and fulguration.  Patient has been maintained with Foley catheter at his nursing facility since then.  Patient presented to the ED today because of gross hematuria.  Patient is describing pain in the penile area.  He has had gross hematuria coming from around the urethral catheter.  Patient denies any fevers or chills.  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 Newell Coral, 1992-->surg at [facility].  Summer 2021->Cervicalgia,arm pain, hand atrophy RUE wkness, hyperreflex (Dr. 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   Hematuria 06/19/2020   Sacral decubitus ulcer 06/19/2020   COVID-19 virus infection 06/06/2020 06/19/2020   Palliative care by specialist    Goals of care, counseling/discussion    Protein-calorie malnutrition, severe 05/05/2020   Chronic indwelling Foley catheter 05/03/2020    Quadriplegia and quadriparesis (HCC)    Acute blood loss anemia    Slow transit constipation    Neuropathic pain    Asthma    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    Past Surgical History:  Procedure Laterality Date   ANTERIOR CERVICAL DECOMP/DISCECTOMY FUSION N/A 04/05/2020   Procedure: ANTERIOR CERVICAL DECOMPRESSION/DISCECTOMY FUSION, INTERBODY PROSTHESIS, PLATE/SCREWS CERVICAL THREE-CERVICAL FOUR, CERVICAL FOUR- CERVICAL FIVE;  Surgeon: Tressie Stalker, MD;  Location[facility] OR;  Service: Neurosurgery;  Laterality: N/A;   ANTERIOR CERVICAL DECOMP/DISCECTOMY FUSION N/A 04/22/2020   Procedure: Reexploration of anterior cervical wound for epidural hematoma;  Surgeon: Donalee Citrin, MD;  Location: [facility] OR;  Service: Neurosurgery;  Laterality: N/A;   BACK SURGERY     CYSTOSCOPY N/A 11/13/2020   Procedure: CYSTOSCOPY, CLOT EVACUATION, FULGURATION;  Surgeon: Noel Christmas, MD;  Location: [facility] ORS;  Service: Urology;  Laterality: N/A;   INCISION AND DRAINAGE Left 2012   L hand infection   IR GASTROSTOMY TUBE MOD SED  07/05/2020   IR REPLC GASTRO/COLONIC TUBE PERCUT W/FLUORO  01/19/2021   ROTATOR CUFF REPAIR Right    x 3   SPINE SURGERY         Family History  Problem Relation Age of Onset   Heart attack Mother    Heart disease Mother    High blood pressure Mother    Alcohol abuse Father    Cancer Father    Diabetes Father    High blood pressure Father     Social History  Tobacco Use   Smoking status: Former   Smokeless tobacco: Never  Vaping Use   Vaping Use: Never used  Substance Use Topics   Alcohol use: Not Currently   Drug use: Never    Home Medications Prior to Admission medications   Medication Sig Start Date End Date Taking? Authorizing Provider  albuterol (VENTOLIN HFA) 108 (90 Base) MCG/ACT inhaler Inhale 2 puffs into the lungs daily.    [provider]  AMINO ACIDS-PROTEIN HYDROLYS PO Take by mouth 2 (two) times daily between  meals. 17-100 gram/ Kcal/30 mL   (Also known as Pro -Stat)    [provider]  ascorbic acid (VITAMIN C) 500 MG tablet Take 1,000 mg by mouth daily.    [provider]  aspirin 81 MG chewable tablet Chew 1 tablet (81 mg total) by mouth every morning. 12/02/20   Rodolph Bong, MD  Cranberry 400 MG CAPS Take 1 capsule by mouth daily at 6 (six) AM.    [provider]  docusate (COLACE) 50 MG/5ML liquid Place 100 mg into feeding tube 2 (two) times daily.    [provider]  DULoxetine (CYMBALTA) 30 MG capsule Take 30 mg by mouth daily.    [provider]  Ferrous Sulfate 220 (44 Fe) MG/5ML LIQD Give 5 mLs by tube in the morning, at noon, and at bedtime. Via G tube    [provider]  Lactobacillus (PROBIOTIC ACIDOPHILUS) CAPS Place 1 capsule into feeding tube daily.    [provider]  midodrine (PROAMATINE) 10 MG tablet Place 1 tablet (10 mg total) into feeding tube 3 (three) times daily. 11/18/20   Rodolph Bong, MD  nutrition supplement, JUVEN, [employer]) PACK Place 1 packet into feeding tube 2 (two) times daily between meals. 11/18/20   Rodolph Bong, MD  Nutritional Supplements (FEEDING SUPPLEMENT, OSMOLITE 1.5 CAL,) LIQD Place 1,000 mLs into feeding tube continuous. 11/18/20   Rodolph Bong, MD  Nutritional Supplements (FEEDING SUPPLEMENT, PROSOURCE TF,) liquid Place 45 mLs into feeding tube 2 (two) times daily. 11/18/20   Rodolph Bong, MD  omeprazole (PRILOSEC) 20 MG capsule Take 20 mg by mouth every other day. Per Tube    [provider]  oxyCODONE-acetaminophen (PERCOCET/ROXICET) 5-325 MG tablet Place 1 tablet into feeding tube 2 (two) times daily. 12/21/20   Hughie Closs, MD  polyethylene glycol (MIRALAX / GLYCOLAX) 17 g packet Place 17 g into feeding tube daily.    [provider]  pregabalin (LYRICA) 100 MG capsule Take 100 mg by mouth 2 (two) times daily.    [provider]   scopolamine (TRANSDERM-SCOP) 1 MG/3DAYS Place 1 patch onto the skin every 3 (three) days.    [provider]  tamsulosin (FLOMAX) 0.4 MG CAPS capsule Take 1 capsule (0.4 mg total) by mouth daily. 04/28/20   Tressie Stalker, MD  zinc gluconate 50 MG tablet Take 50 mg by mouth daily.    [provider]    Allergies    Iodine and Penicillins  Review of Systems   Review of Systems  All other systems reviewed and are negative.  Physical Exam Updated Vital Signs BP 128/84   Pulse (!) 101   Temp 98.7 F (37.1 C) (Axillary)   Resp 18   Ht 1.803 m (5\' 11" )   Wt 86.6 kg   SpO2 99%   BMI 26.64 kg/m   Physical Exam Vitals and nursing note reviewed.  Constitutional:      General:  He is not in acute distress.    Appearance: He is well-developed.     Comments: Chronically ill-appearing  HENT:     Head: Normocephalic and atraumatic.     Right Ear: External ear normal.     Left Ear: External ear normal.  Eyes:     General: No scleral icterus.       Right eye: No discharge.        Left eye: No discharge.     Conjunctiva/sclera: Conjunctivae normal.  Neck:     Trachea: No tracheal deviation.  Cardiovascular:     Rate and Rhythm: Regular rhythm. Tachycardia present.  Pulmonary:     Effort: Pulmonary effort is normal. No respiratory distress.     Breath sounds: Normal breath sounds. No stridor. No wheezing or rales.  Abdominal:     General: Bowel sounds are normal. There is no distension.     Palpations: Abdomen is soft.     Tenderness: There is no abdominal tenderness. There is no guarding or rebound.  Genitourinary:    Comments: Urethral catheter in place, minimal output through the catheter, gross blood noted coming from around catheter at the urethral meatus Musculoskeletal:        General: No tenderness or deformity.     Cervical back: Neck supple.  Skin:    General: Skin is warm and dry.     Findings: No rash.  Neurological:     General: No focal  deficit present.     Mental Status: He is alert.     Cranial Nerves: No cranial nerve deficit (no facial droop, extraocular movements intact, no slurred speech).     Sensory: No sensory deficit.     Motor: Weakness and abnormal muscle tone present. No seizure activity.     Comments: Paraplegia  Psychiatric:        Mood and Affect: Mood normal.    ED Results / Procedures / Treatments   Labs (all labs ordered are listed, but only abnormal results are displayed) Labs Reviewed  CBC WITH DIFFERENTIAL/PLATELET - Abnormal; Notable for the following components:      Result Value   RBC 3.62 (*)    Hemoglobin 8.4 (*)    HCT 28.9 (*)    MCV 79.8 (*)    MCH 23.2 (*)    MCHC 29.1 (*)    RDW 16.1 (*)    Monocytes Absolute 1.1 (*)    Eosinophils Absolute 1.2 (*)    All other components within normal limits  BASIC METABOLIC PANEL - Abnormal; Notable for the following components:   Glucose, Bld 131 (*)    BUN 31 (*)    Creatinine, Ser 0.45 (*)    All other components within normal limits  PROTIME-INR  URINALYSIS, DIPSTICK ONLY  TYPE AND SCREEN    EKG None  Radiology No results found.  Procedures Procedures   Medications Ordered in ED Medications  sodium chloride irrigation 0.9 % 3,000 mL (3,000 mLs Irrigation New Bag/Given 02/01/21 2343)  0.9 %  sodium chloride infusion ( Intravenous New Bag/Given 02/01/21 2256)  morphine 4 MG/ML injection 4 mg (4 mg Intravenous Given 02/01/21 2302)  morphine 4 MG/ML injection 4 mg (4 mg Intravenous Given 02/01/21 2353)    ED Course  I have reviewed the triage vital signs and the nursing notes.  Pertinent labs & imaging results that were available during my care of the patient were reviewed by me and considered in my medical decision making (see chart for  details).  Clinical Course as of 02/02/21 0024  Thu Feb 01, 2021  2346 Hemoglobin stable at 8.4 [JK]  2346 Renal function normal [JK]  Fri Feb 02, 2021  0022 Patient undergoing continuous  bladder irrigation.  Starting to clear now [JK]  0022 BP: 128/84 [JK]    Clinical Course User Index [JK] Linwood DibblesKnapp, Alegandro Macnaughton, MD   MDM Rules/Calculators/A&P                           Patient presents the ED with complaints of hematuria.  Patient has notable blood coming from around his catheter site.  He is also passing clots.  Patient's initial labs are stable.  No signs of acute blood loss anemia.  His chronic anemia is unchanged.  Patient had large amount of clots were passing but he is starting to clear with continuous bladder irrigation.  We will continue with irrigation and reassess.  Care turned over to Dr. Madilyn Hookees  Final Clinical Impression(s) / ED Diagnoses Final diagnoses:  Hematuria, unspecified type    Rx / DC Orders ED Discharge Orders     None        Linwood DibblesKnapp, Anastasiya Gowin, MD 02/02/21 [phone]

## 2021-02-02 DIAGNOSIS — N319 Neuromuscular dysfunction of bladder, unspecified: Secondary | ICD-10-CM | POA: Diagnosis present

## 2021-02-02 DIAGNOSIS — G950 Syringomyelia and syringobulbia: Secondary | ICD-10-CM | POA: Diagnosis present

## 2021-02-02 DIAGNOSIS — J45909 Unspecified asthma, uncomplicated: Secondary | ICD-10-CM | POA: Diagnosis present

## 2021-02-02 DIAGNOSIS — Z888 Allergy status to other drugs, medicaments and biological substances status: Secondary | ICD-10-CM | POA: Diagnosis not present

## 2021-02-02 DIAGNOSIS — Z66 Do not resuscitate: Secondary | ICD-10-CM | POA: Diagnosis present

## 2021-02-02 DIAGNOSIS — Z7982 Long term (current) use of aspirin: Secondary | ICD-10-CM | POA: Diagnosis not present

## 2021-02-02 DIAGNOSIS — G935 Compression of brain: Secondary | ICD-10-CM | POA: Diagnosis present

## 2021-02-02 DIAGNOSIS — E43 Unspecified severe protein-calorie malnutrition: Secondary | ICD-10-CM | POA: Diagnosis present

## 2021-02-02 DIAGNOSIS — Z8249 Family history of ischemic heart disease and other diseases of the circulatory system: Secondary | ICD-10-CM | POA: Diagnosis not present

## 2021-02-02 DIAGNOSIS — R319 Hematuria, unspecified: Secondary | ICD-10-CM | POA: Diagnosis present

## 2021-02-02 DIAGNOSIS — R1030 Lower abdominal pain, unspecified: Secondary | ICD-10-CM | POA: Diagnosis present

## 2021-02-02 DIAGNOSIS — T8383XA Hemorrhage of genitourinary prosthetic devices, implants and grafts, initial encounter: Secondary | ICD-10-CM | POA: Diagnosis present

## 2021-02-02 DIAGNOSIS — Y846 Urinary catheterization as the cause of abnormal reaction of the patient, or of later complication, without mention of misadventure at the time of the procedure: Secondary | ICD-10-CM | POA: Diagnosis present

## 2021-02-02 DIAGNOSIS — L89154 Pressure ulcer of sacral region, stage 4: Secondary | ICD-10-CM | POA: Diagnosis present

## 2021-02-02 DIAGNOSIS — Z981 Arthrodesis status: Secondary | ICD-10-CM | POA: Diagnosis not present

## 2021-02-02 DIAGNOSIS — Z20822 Contact with and (suspected) exposure to covid-19: Secondary | ICD-10-CM | POA: Diagnosis present

## 2021-02-02 DIAGNOSIS — Z87891 Personal history of nicotine dependence: Secondary | ICD-10-CM | POA: Diagnosis not present

## 2021-02-02 DIAGNOSIS — Z7401 Bed confinement status: Secondary | ICD-10-CM | POA: Diagnosis not present

## 2021-02-02 DIAGNOSIS — G8250 Quadriplegia, unspecified: Secondary | ICD-10-CM | POA: Diagnosis present

## 2021-02-02 DIAGNOSIS — L89626 Pressure-induced deep tissue damage of left heel: Secondary | ICD-10-CM | POA: Diagnosis present

## 2021-02-02 DIAGNOSIS — D62 Acute posthemorrhagic anemia: Secondary | ICD-10-CM | POA: Diagnosis present

## 2021-02-02 DIAGNOSIS — Z8616 Personal history of COVID-19: Secondary | ICD-10-CM | POA: Diagnosis not present

## 2021-02-02 DIAGNOSIS — R310 Gross hematuria: Secondary | ICD-10-CM | POA: Diagnosis present

## 2021-02-02 DIAGNOSIS — Z79899 Other long term (current) drug therapy: Secondary | ICD-10-CM | POA: Diagnosis not present

## 2021-02-02 DIAGNOSIS — Z880 Allergy status to penicillin: Secondary | ICD-10-CM | POA: Diagnosis not present

## 2021-02-02 DIAGNOSIS — Z833 Family history of diabetes mellitus: Secondary | ICD-10-CM | POA: Diagnosis not present

## 2021-02-02 LAB — URINALYSIS, DIPSTICK ONLY
Bilirubin Urine: NEGATIVE
Glucose, UA: NEGATIVE mg/dL
Ketones, ur: NEGATIVE mg/dL
Nitrite: NEGATIVE
Protein, ur: 30 mg/dL — AB
Specific Gravity, Urine: 1.004 — ABNORMAL LOW (ref 1.005–1.030)
pH: 6 (ref 5.0–8.0)

## 2021-02-02 LAB — RESP PANEL BY RT-PCR (FLU A&B, COVID) ARPGX2
Influenza A by PCR: NEGATIVE
Influenza B by PCR: NEGATIVE
SARS Coronavirus 2 by RT PCR: NEGATIVE

## 2021-02-02 LAB — BASIC METABOLIC PANEL
Anion gap: 6 (ref 5–15)
BUN: 25 mg/dL — ABNORMAL HIGH (ref 8–23)
CO2: 31 mmol/L (ref 22–32)
Calcium: 9.2 mg/dL (ref 8.9–10.3)
Chloride: 106 mmol/L (ref 98–111)
Creatinine, Ser: 0.46 mg/dL — ABNORMAL LOW (ref 0.61–1.24)
GFR, Estimated: >60 mL/min (ref >60)
Glucose, Bld: 122 mg/dL — ABNORMAL HIGH (ref 70–99)
Potassium: 3.6 mmol/L (ref 3.5–5.1)
Sodium: 143 mmol/L (ref 135–145)

## 2021-02-02 LAB — CBC
HCT: 24.4 % — ABNORMAL LOW (ref 39.0–52.0)
HCT: 27.5 % — ABNORMAL LOW (ref 39.0–52.0)
Hemoglobin: 7 g/dL — ABNORMAL LOW (ref 13.0–17.0)
Hemoglobin: 8 g/dL — ABNORMAL LOW (ref 13.0–17.0)
MCH: 22.9 pg — ABNORMAL LOW (ref 26.0–34.0)
MCH: 23.3 pg — ABNORMAL LOW (ref 26.0–34.0)
MCHC: 28.7 g/dL — ABNORMAL LOW (ref 30.0–36.0)
MCHC: 29.1 g/dL — ABNORMAL LOW (ref 30.0–36.0)
MCV: 79.7 fL — ABNORMAL LOW (ref 80.0–100.0)
MCV: 80.2 fL (ref 80.0–100.0)
Platelets: 251 10*3/uL (ref 150–400)
Platelets: 217 10*3/uL (ref 150–400)
RBC: 3.06 MIL/uL — ABNORMAL LOW (ref 4.22–5.81)
RBC: 3.43 MIL/uL — ABNORMAL LOW (ref 4.22–5.81)
RDW: 16.1 % — ABNORMAL HIGH (ref 11.5–15.5)
RDW: 15.9 % — ABNORMAL HIGH (ref 11.5–15.5)
WBC: 10.1 10*3/uL (ref 4.0–10.5)
WBC: 9.4 10*3/uL (ref 4.0–10.5)
nRBC: 0 % (ref 0.0–0.2)
nRBC: 0 % (ref 0.0–0.2)

## 2021-02-02 LAB — PROTIME-INR
INR: 1.1 (ref 0.8–1.2)
Prothrombin Time: 14.5 seconds (ref 11.4–15.2)

## 2021-02-02 LAB — CBC WITH DIFFERENTIAL/PLATELET
Abs Immature Granulocytes: 0.04 10*3/uL (ref 0.00–0.07)
Basophils Absolute: 0.1 10*3/uL (ref 0.0–0.1)
Basophils Relative: 1 %
Eosinophils Absolute: 1.2 10*3/uL — ABNORMAL HIGH (ref 0.0–0.5)
Eosinophils Relative: 14 %
HCT: 27.2 % — ABNORMAL LOW (ref 39.0–52.0)
Hemoglobin: 7.8 g/dL — ABNORMAL LOW (ref 13.0–17.0)
Immature Granulocytes: 0 %
Lymphocytes Relative: 12 %
Lymphs Abs: 1.1 10*3/uL (ref 0.7–4.0)
MCH: 23.3 pg — ABNORMAL LOW (ref 26.0–34.0)
MCHC: 28.7 g/dL — ABNORMAL LOW (ref 30.0–36.0)
MCV: 81.2 fL (ref 80.0–100.0)
Monocytes Absolute: 0.9 10*3/uL (ref 0.1–1.0)
Monocytes Relative: 10 %
Neutro Abs: 5.8 10*3/uL (ref 1.7–7.7)
Neutrophils Relative %: 63 %
Platelets: 244 10*3/uL (ref 150–400)
RBC: 3.35 MIL/uL — ABNORMAL LOW (ref 4.22–5.81)
RDW: 16.1 % — ABNORMAL HIGH (ref 11.5–15.5)
WBC: 9.1 10*3/uL (ref 4.0–10.5)
nRBC: 0 % (ref 0.0–0.2)

## 2021-02-02 LAB — TSH: TSH: 3.7 u[IU]/mL (ref 0.350–4.500)

## 2021-02-02 LAB — PREPARE RBC (CROSSMATCH)

## 2021-02-02 MED ORDER — FERROUS SULFATE 300 (60 FE) MG/5ML PO SYRP
300.0000 mg | ORAL_SOLUTION | Freq: Three times a day (TID) | ORAL | Status: DC
  Administered 2021-02-02 – 2021-02-03 (×5): 300 mg
  Filled 2021-02-02 (×7): qty 5

## 2021-02-02 MED ORDER — ALBUTEROL SULFATE (2.5 MG/3ML) 0.083% IN NEBU
3.0000 mL | INHALATION_SOLUTION | Freq: Two times a day (BID) | RESPIRATORY_TRACT | Status: DC
  Administered 2021-02-02 – 2021-02-03 (×4): 3 mL via RESPIRATORY_TRACT
  Filled 2021-02-02 (×4): qty 3

## 2021-02-02 MED ORDER — DOCUSATE SODIUM 50 MG/5ML PO LIQD
100.0000 mg | Freq: Two times a day (BID) | ORAL | Status: DC
  Administered 2021-02-02 (×2): 100 mg
  Filled 2021-02-02 (×5): qty 10

## 2021-02-02 MED ORDER — NYSTATIN 100000 UNIT/GM EX POWD
1.0000 "application " | Freq: Every day | CUTANEOUS | Status: DC
  Filled 2021-02-02 (×2): qty 15

## 2021-02-02 MED ORDER — OXYCODONE-ACETAMINOPHEN 5-325 MG PO TABS
1.0000 | ORAL_TABLET | Freq: Two times a day (BID) | ORAL | Status: DC
  Administered 2021-02-02 – 2021-02-03 (×3): 1
  Filled 2021-02-02 (×3): qty 1

## 2021-02-02 MED ORDER — SODIUM CHLORIDE 0.9% IV SOLUTION: Freq: Once | INTRAVENOUS | Status: AC

## 2021-02-02 MED ORDER — PREGABALIN 50 MG PO CAPS
100.0000 mg | ORAL_CAPSULE | Freq: Two times a day (BID) | ORAL | Status: DC
  Administered 2021-02-02 – 2021-02-03 (×3): 100 mg
  Filled 2021-02-02 (×3): qty 2

## 2021-02-02 MED ORDER — CHLORHEXIDINE GLUCONATE CLOTH 2 % EX PADS
6.0000 | MEDICATED_PAD | Freq: Every day | CUTANEOUS | Status: DC
  Administered 2021-02-02 – 2021-02-03 (×2): 6 via TOPICAL

## 2021-02-02 MED ORDER — DULOXETINE HCL 30 MG PO CPEP
30.0000 mg | ORAL_CAPSULE | Freq: Every day | ORAL | Status: DC
  Administered 2021-02-02 – 2021-02-03 (×2): 30 mg via ORAL
  Filled 2021-02-02 (×2): qty 1

## 2021-02-02 MED ORDER — ACETAMINOPHEN 650 MG RE SUPP: 650.0000 mg | Freq: Four times a day (QID) | RECTAL | Status: DC | PRN

## 2021-02-02 MED ORDER — SCOPOLAMINE 1 MG/3DAYS TD PT72
1.0000 | MEDICATED_PATCH | TRANSDERMAL | Status: DC
  Administered 2021-02-02: 1.5 mg via TRANSDERMAL
  Filled 2021-02-02 (×2): qty 1

## 2021-02-02 MED ORDER — MIDODRINE HCL 5 MG PO TABS
10.0000 mg | ORAL_TABLET | Freq: Three times a day (TID) | ORAL | Status: DC
  Administered 2021-02-02 – 2021-02-03 (×4): 10 mg
  Filled 2021-02-02 (×4): qty 2

## 2021-02-02 MED ORDER — POLYETHYLENE GLYCOL 3350 17 G PO PACK
17.0000 g | PACK | Freq: Every day | ORAL | Status: DC
  Administered 2021-02-02: 17 g
  Filled 2021-02-02: qty 1

## 2021-02-02 MED ORDER — ASCORBIC ACID 500 MG PO TABS
1000.0000 mg | ORAL_TABLET | Freq: Every day | ORAL | Status: DC
  Administered 2021-02-02 – 2021-02-03 (×2): 1000 mg via ORAL
  Filled 2021-02-02 (×2): qty 2

## 2021-02-02 MED ORDER — ZINC SULFATE 220 (50 ZN) MG PO CAPS
220.0000 mg | ORAL_CAPSULE | Freq: Every day | ORAL | Status: DC
  Administered 2021-02-02 – 2021-02-03 (×2): 220 mg
  Filled 2021-02-02 (×2): qty 1

## 2021-02-02 MED ORDER — TAMSULOSIN HCL 0.4 MG PO CAPS
0.4000 mg | ORAL_CAPSULE | Freq: Every day | ORAL | Status: DC
  Administered 2021-02-02 – 2021-02-03 (×2): 0.4 mg via ORAL
  Filled 2021-02-02 (×2): qty 1

## 2021-02-02 MED ORDER — PANTOPRAZOLE SODIUM 40 MG PO PACK
40.0000 mg | PACK | Freq: Every day | ORAL | Status: DC
  Administered 2021-02-02 – 2021-02-03 (×2): 40 mg
  Filled 2021-02-02 (×3): qty 20

## 2021-02-02 MED ORDER — SACCHAROMYCES BOULARDII 250 MG PO CAPS
250.0000 mg | ORAL_CAPSULE | Freq: Every day | ORAL | Status: DC
  Administered 2021-02-02 – 2021-02-03 (×2): 250 mg
  Filled 2021-02-02 (×2): qty 1

## 2021-02-02 MED ORDER — ACETAMINOPHEN 325 MG PO TABS: 650.0000 mg | ORAL_TABLET | Freq: Four times a day (QID) | ORAL | Status: DC | PRN

## 2021-02-02 MED ORDER — SODIUM CHLORIDE 0.9% FLUSH: 3.0000 mL | Freq: Two times a day (BID) | INTRAVENOUS | Status: DC

## 2021-02-02 NOTE — ED Notes (Signed)
Call to blood bank so see about blood being ready, they are still working on it

## 2021-02-02 NOTE — ED Notes (Signed)
Gregori Abril patients daughter would like a call back [phone]

## 2021-02-02 NOTE — ED Notes (Signed)
Called nutrition services to get pt breakfast tray

## 2021-02-02 NOTE — ED Provider Notes (Signed)
Patient care assumed at 0000 pending CBI.  Following irrigation patient's urine would begin to clear but as soon as irrigation was discontinued he would develop recurrent grossly bloodied urine followed by recollection of clots blocking his catheter. Discussed with Dr. Remer Macho with urology. He recommends continuing CBI. He will see the patient in consult. He does recommend that the patient is kept NPO in the event that a surgical procedure is needed. Medicine consulted for admission.   Tilden Fossa, MD 02/02/21 [phone]

## 2021-02-02 NOTE — ED Notes (Signed)
Called pt daughter and gave her an update

## 2021-02-02 NOTE — ED Notes (Signed)
RN called [name], patients daughter and gave her an update.

## 2021-02-02 NOTE — ED Notes (Signed)
Pt repositioned for comfort and being fed breakfast by tech.  Medications available given by feeding tube and flushed with water.  Await the rest of pt medications from pharmacy

## 2021-02-02 NOTE — ED Notes (Signed)
Per [facility] pt has a DNR and MOST form (this form did not come with pt from facility, will call them)

## 2021-02-02 NOTE — ED Notes (Signed)
Please note that DNR/most form received via fax from SNF, placed this at bedside.

## 2021-02-02 NOTE — ED Notes (Signed)
Pt is resting, hospice RN came to check on him and will update his daughter.  Pt continues to receive continuous bladder irrigation with the goal of clear to light pink urine output from foley, at this time a very slow drip rate is achieving this.

## 2021-02-02 NOTE — ED Notes (Signed)
Checked on pt and changed him, changed linens.  Pt continues to have small amount of deep red urine draining from bladder.  Pt repositioned and warm blankets for comfort.  Pt face washed and noted some bleeding from spot at the left side of head.  Gauze dressing applied.

## 2021-02-02 NOTE — ED Notes (Signed)
Call to [facility] to let them know that pt is being admitted and to clarify pt diet at home as well as if pt has a DNR or MOST form with them. Spoke with Ramatou who advised that pt has DNR form and she will fax this to me.  She advised that pt takes very little by mouth but it given a soft diet and regular liquids by mouth in addition to tube feeding.

## 2021-02-02 NOTE — H&P (Signed)
History and Physical    Shannon [name] TGG:[id number] DOB: [birth year] DOA: 02/01/2021  PCP: [name], MD  Patient coming from: Rehab  I have personally briefly reviewed patient's old medical records in [facility]  Chief Complaint: bleeding from urinary catheter  HPI: Shannon [name] is a 75 y.o. 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 Per last d/c summary patient is hospice at [facility] since May 2022. But still remains a full code.  [facility] due to hematuria. Patient has had prior episode of this 5/22 with severe bleeding due to traumatic foley placement, requiring transfusion support and cystoscopy with clot removal and fulguration. Patient currently has associated complaint of penile pain  and lower abdominal pain but no fever/chills/ n/v /presyncope/chest pain /fatigue or sob.  ED Course:  BP 128/84   Pulse (!) 101   Temp 98.7 F (37.1 C) (Axillary)   Resp 18   Ht 1.803 m (5\' 11" )   Wt 86.6 kg   SpO2 99%   BMI 26.64 kg/m   labs: NA 139, K 3.8, CL 101, bicar 31 , cr 0.45 Urine dip:+heme Tx NS, morphine, EKG: Sinus tachycardia , LAFB COVID neg Wbc: 10.1 hgb 7 was 8.4 ( which is patient baseline) ,plt:251  Review of Systems: As per HPI otherwise 10 point review of systems negative.   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 , 1992-->surg at [facility].  Summer 2021->Cervicalgia,arm  pain, hand atrophy RUE wkness, hyperreflex (Dr. 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->to get surgery 03/2020   Hay fever    Hypertension    Osteoarthritis of both hands     Past Surgical History:  Procedure Laterality Date   ANTERIOR CERVICAL DECOMP/DISCECTOMY FUSION N/A 04/05/2020   Procedure: ANTERIOR CERVICAL DECOMPRESSION/DISCECTOMY FUSION, INTERBODY PROSTHESIS, PLATE/SCREWS CERVICAL THREE-CERVICAL FOUR, CERVICAL FOUR- CERVICAL FIVE;  Surgeon: 04/07/2020, MD;  Location: [facility] OR;  Service: Neurosurgery;  Laterality: N/A;   ANTERIOR CERVICAL DECOMP/DISCECTOMY FUSION N/A 04/22/2020   Procedure: Reexploration of anterior cervical wound for epidural hematoma;  Surgeon: 04/24/2020, MD;  Location: [facility] OR;  Service: Neurosurgery;  Laterality: N/A;   BACK SURGERY     CYSTOSCOPY N/A 11/13/2020   Procedure: CYSTOSCOPY, CLOT EVACUATION, FULGURATION;  Surgeon: 11/15/2020, MD;  Location: [facility] ORS;  Service: Urology;  Laterality: N/A;   INCISION AND DRAINAGE Left 2012   L hand infection   IR GASTROSTOMY TUBE MOD SED  07/05/2020   IR REPLC GASTRO/COLONIC TUBE PERCUT W/FLUORO  01/19/2021   ROTATOR CUFF REPAIR Right    x 3   SPINE SURGERY       reports that he has quit smoking. He has never used smokeless tobacco. He reports that he does not currently use alcohol. He reports that he does not use drugs.  Allergies  Allergen Reactions   Iodine Swelling    NOT CT CONTRAST   Penicillins Swelling    **  tolerates cephalosporins Facial swelling, itchy throat    Family History  Problem Relation Age of Onset   Heart attack Mother    Heart disease Mother    High blood pressure Mother    Alcohol abuse Father    Cancer Father    Diabetes Father    High blood pressure Father     Prior to Admission medications   Medication Sig Start Date End Date Taking? Authorizing Provider  albuterol (VENTOLIN HFA) 108 (90 Base) MCG/ACT inhaler Inhale 2 puffs into the  lungs 2 (two) times daily.   Yes [provider]  ascorbic acid (VITAMIN C) 500 MG tablet Take 1,000 mg by mouth daily.   Yes [provider]  aspirin 81 MG chewable tablet Chew 1 tablet (81 mg total) by mouth every morning. 12/02/20  Yes Rodolph Bong, MD  Cranberry 400 MG CAPS Take 1 capsule by mouth daily at 6 (six) AM.   Yes [provider]  docusate (COLACE) 50 MG/5ML liquid Place 100 mg into feeding tube 2 (two) times daily.   Yes [provider]  DULoxetine (CYMBALTA) 30 MG capsule Take 30 mg by mouth daily.   Yes [provider]  Ferrous Sulfate 220 (44 Fe) MG/5ML LIQD Give 5 mLs by tube in the morning, at noon, and at bedtime. Via G tube   Yes [provider]  guaiFENesin (ROBITUSSIN) 100 MG/5ML liquid Take 100 mg by mouth 2 (two) times daily.   Yes [provider]  nutrition supplement, JUVEN, (JUVEN) PACK Place 1 packet into feeding tube 2 (two) times daily between meals. 11/18/20  Yes Rodolph Bong, MD  AMINO ACIDS-PROTEIN HYDROLYS PO Take by mouth 2 (two) times daily between meals. 17-100 gram/ Kcal/30 mL   (Also known as Pro -Stat)    [provider]  Lactobacillus (PROBIOTIC ACIDOPHILUS) CAPS Place 1 capsule into feeding tube daily.    [provider]  midodrine (PROAMATINE) 10 MG tablet Place 1 tablet (10 mg total) into feeding tube 3 (three) times daily. 11/18/20   Rodolph Bong, MD  Nutritional Supplements (FEEDING SUPPLEMENT, OSMOLITE 1.5 CAL,) LIQD Place 1,000 mLs into feeding tube continuous. 11/18/20   Rodolph Bong, MD  Nutritional Supplements (FEEDING SUPPLEMENT, PROSOURCE TF,) liquid Place 45 mLs into feeding tube 2 (two) times daily. 11/18/20   Rodolph Bong, MD  omeprazole (PRILOSEC) 20 MG capsule Take 20 mg by mouth every other day. Per Tube    [provider]  oxyCODONE-acetaminophen (PERCOCET/ROXICET) 5-325 MG tablet Place 1 tablet into feeding tube 2 (two) times  daily. 12/21/20   Hughie Closs, MD  polyethylene glycol (MIRALAX / GLYCOLAX) 17 g packet Place 17 g into feeding tube daily.    [provider]  Pregabalin 20 MG/ML SOLN Give 5 mLs by tube 2 (two) times daily. 01/24/21   [provider]  scopolamine (TRANSDERM-SCOP) 1 MG/3DAYS Place 1 patch onto the skin every 3 (three) days.    [provider]  tamsulosin (FLOMAX) 0.4 MG CAPS capsule Take 1 capsule (0.4 mg total) by mouth daily. 04/28/20   Tressie Stalker, MD  zinc gluconate 50 MG tablet Take 50 mg by mouth daily.    [provider]    Physical Exam: Vitals:   02/02/21 0500 02/02/21 0530 02/02/21 0600 02/02/21 0630  BP: 108/65 108/65 103/65 101/67  Pulse: 95 94 100 97  Resp: 11 18 14 13   Temp:      TempSrc:  SpO2: 100% 99% 99% 98%  Weight:      Height:         Vitals:   02/02/21 0500 02/02/21 0530 02/02/21 0600 02/02/21 0630  BP: 108/65 108/65 103/65 101/67  Pulse: 95 94 100 97  Resp: 11 18 14 13   Temp:      TempSrc:      SpO2: 100% 99% 99% 98%  Weight:      Height:      Constitutional: NAD, calm, comfortable Eyes: PERRL, lids and conjunctivae normal ENMT: Mucous membranes are dry. Noted peridontal disease and bleeding gums Posterior pharynx clear of any exudate or lesions. Neck: normal, supple, no masses, no thyromegaly Respiratory: clear to auscultation bilaterally, no wheezing, no crackles. Normal respiratory effort. No accessory muscle use.  Cardiovascular: Regular rate and rhythm, no murmurs / rubs / gallops. No extremity edema. 2+ pedal pulses. No carotid bruits.  Abdomen: no tenderness, no masses palpated. No hepatosplenomegaly. Bowel sounds positive.  Musculoskeletal: no clubbing / cyanosis. No joint deformity upper and lower extremities. Good ROM, no contractures. Normal muscle tone.  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  Neurologic: alert and oriented x3 ,quadriplegia, cn in tact grossly   Psychiatric: Normal judgment and insight. Normal mood.    Labs on Admission: I have personally reviewed following labs and imaging studies  CBC: Recent Labs  Lab 02/01/21 2250 02/02/21 0548  WBC 9.3 10.1  NEUTROABS 5.7  --   HGB 8.4* 7.0*  HCT 28.9* 24.4*  MCV 79.8* 79.7*  PLT 263 251   Basic Metabolic Panel: Recent Labs  Lab 02/01/21 2250  NA 139  K 3.8  CL 101  CO2 31  GLUCOSE 131*  BUN 31*  CREATININE 0.45*  CALCIUM 9.2   GFR: Estimated Creatinine Clearance: 86.3 mL/min (A) (by C-G formula based on SCr of 0.45 mg/dL (L)). Liver Function Tests: No results for input(s): AST, ALT, ALKPHOS, BILITOT, PROT, ALBUMIN in the last 168 hours. No results for input(s): LIPASE, AMYLASE in the last 168 hours. No results for input(s): AMMONIA in the last 168 hours. Coagulation Profile: Recent Labs  Lab 02/01/21 2250  INR 1.1   Cardiac Enzymes: No results for input(s): CKTOTAL, CKMB, CKMBINDEX, TROPONINI in the last 168 hours. BNP (last 3 results) No results for input(s): PROBNP in the last 8760 hours. HbA1C: No results for input(s): HGBA1C in the last 72 hours. CBG: No results for input(s): GLUCAP in the last 168 hours. Lipid Profile: No results for input(s): CHOL, HDL, LDLCALC, TRIG, CHOLHDL, LDLDIRECT in the last 72 hours. Thyroid Function Tests: No results for input(s): TSH, T4TOTAL, FREET4, T3FREE, THYROIDAB in the last 72 hours. Anemia Panel: No results for input(s): VITAMINB12, FOLATE, FERRITIN, TIBC, IRON, RETICCTPCT in the last 72 hours. Urine analysis:    Component Value Date/Time   COLORURINE RED (A) 02/01/2021 2347   APPEARANCEUR CLOUDY (A) 02/01/2021 2347   LABSPEC 1.004 (L) 02/01/2021 2347   PHURINE 6.0 02/01/2021 2347   GLUCOSEU NEGATIVE 02/01/2021 2347   HGBUR MODERATE (A) 02/01/2021 2347   BILIRUBINUR NEGATIVE 02/01/2021 2347   KETONESUR NEGATIVE 02/01/2021 2347   PROTEINUR 30 (A) 02/01/2021 2347   NITRITE NEGATIVE 02/01/2021 2347    LEUKOCYTESUR SMALL (A) 02/01/2021 2347    Radiological Exams on Admission: No results found.  EKG: Independently reviewed.   Assessment/Plan Hematuria  -prior hx 5/22 with severe bleeding requiring cystoscope --monitor h/h  -check inr  -urology consulted from ED Dr 6/22 ,  no plans for OR at this time -continue on low bladder irrigation, await final recs   Anemia of blood loss -continue iron supplements -monitor h/h due to active hematuria  -transfuse 1 units prbc   Asthma -no acute exacerbation  - continue albuterol nebs prn   Chiari malformation associated with syringomyelia  -quadriparesis chronic bed bound state  -neurogenic bladder chronic foley -Long  term resident of  [place]   Stage IV sacral decubitus ulcer /sacral osteomyelitis   -complicated by recent admission 6/17-6/30 with septic shock - s/p antibiotics x 14 days(Merrem and vancomycin)  -no current systemic signs of infection    Severe protein calorie malnutrition - on tube feeds supplementation in addition to diet  DVT prophylaxis: scd Code Status:FULL Family Communication: n/a Disposition Plan: patient  expected to be admitted greater than 2 midnights  Consults called: Urology Dr Benancio DeedsNewsome Admission status: progressive   Lurline DelSara-Maiz A Celvin Taney MD [facility]   If 7PM-7AM, please contact night-coverage [email or web address] Password [email or web address]  02/02/2021, 7:44 AM

## 2021-02-02 NOTE — Consult Note (Signed)
Urology Consult   Physician requesting consult: Dr Madilyn Hook   Reason for consult: Gross hematuria  History of Present Illness: Shannon [name] is a 75 y.o. male with history of quadriplegia and chronic indwelling Foley catheter.  Patient has had a history of gross hematuria in the past requiring cystoscopy and fulguration by Dr. Arita Miss in May 2022 in the source for bleeding was prostatic source felt second to Foley trauma.  Patient presented tonight with recurrent gross hematuria with clots.  Seen in the emergency room and Foley was changed to three-way irrigation Foley.  Urine was not clearing and I was asked to evaluate. Foley is irrigated obtained light-tinged urine no clots.  Patient had significant bladder spasm with bladder irrigation.  Return to CBI with normal saline at low rate CBI and urine remains clear in the tubing.  He denies a history of voiding or storage urinary symptoms, hematuria, UTIs, STDs, urolithiasis, GU malignancy/trauma/surgery.  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 Newell Coral, 1992-->surg at [facility].  Summer 2021->Cervicalgia,arm pain, hand atrophy RUE wkness, hyperreflex (Dr. 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->to get surgery 03/2020   Hay fever    Hypertension    Osteoarthritis of both hands     Past Surgical History:  Procedure Laterality Date   ANTERIOR CERVICAL DECOMP/DISCECTOMY FUSION N/A 04/05/2020   Procedure: ANTERIOR CERVICAL DECOMPRESSION/DISCECTOMY FUSION, INTERBODY PROSTHESIS, PLATE/SCREWS CERVICAL THREE-CERVICAL FOUR, CERVICAL FOUR- CERVICAL FIVE;  Surgeon: Tressie Stalker, MD;  Location: [facility] OR;  Service: Neurosurgery;  Laterality: N/A;   ANTERIOR CERVICAL DECOMP/DISCECTOMY FUSION N/A 04/22/2020   Procedure: Reexploration of anterior cervical wound for epidural hematoma;  Surgeon:  Donalee Citrin, MD;  Location: [facility] OR;  Service: Neurosurgery;  Laterality: N/A;   BACK SURGERY     CYSTOSCOPY N/A 11/13/2020   Procedure: CYSTOSCOPY, CLOT EVACUATION, FULGURATION;  Surgeon: Noel Christmas, MD;  Location: [facility] ORS;  Service: Urology;  Laterality: N/A;   INCISION AND DRAINAGE Left 2012   L hand infection   IR GASTROSTOMY TUBE MOD SED  07/05/2020   IR REPLC GASTRO/COLONIC TUBE PERCUT W/FLUORO  01/19/2021   ROTATOR CUFF REPAIR Right    x 3   SPINE SURGERY      Current Hospital Medications:  Home Meds:  No current facility-administered medications on file prior to encounter.   Current Outpatient Medications on File Prior to Encounter  Medication Sig Dispense Refill   albuterol (VENTOLIN HFA) 108 (90 Base) MCG/ACT inhaler Inhale 2 puffs into the lungs 2 (two) times daily.     AMINO ACIDS-PROTEIN HYDROLYS PO Take by mouth 2 (two) times daily between meals. 17-100 gram/ Kcal/30 mL   (Also known as Pro -Stat)     ascorbic acid (VITAMIN C) 500 MG tablet Take 1,000 mg by mouth daily.     aspirin 81 MG chewable tablet Chew 1 tablet (81 mg total) by mouth every morning.     Cranberry 400 MG CAPS Take 1 capsule by mouth daily at 6 (six) AM.     docusate (COLACE) 50 MG/5ML liquid Place 100 mg into feeding tube 2 (two) times daily.     DULoxetine (CYMBALTA) 30 MG capsule Take 30 mg by mouth daily.     Ferrous Sulfate 220 (44 Fe) MG/5ML LIQD Give 5 mLs by tube in the morning, at noon, and at bedtime. Via G  tube     guaiFENesin (ROBITUSSIN) 100 MG/5ML liquid Take 100 mg by mouth 2 (two) times daily.     Lactobacillus (PROBIOTIC ACIDOPHILUS) CAPS Place 1 capsule into feeding tube daily.     midodrine (PROAMATINE) 10 MG tablet Place 1 tablet (10 mg total) into feeding tube 3 (three) times daily. 90 tablet 1   nutrition supplement, JUVEN, (JUVEN) PACK Place 1 packet into feeding tube 2 (two) times daily between meals.  0   Nutritional Supplements (FEEDING SUPPLEMENT, OSMOLITE 1.5 CAL,) LIQD  Place 1,000 mLs into feeding tube continuous. 60 mL 0   nystatin (MYCOSTATIN/NYSTOP) powder Apply 1 application topically daily. Apply once daily during wound dressing change to sacrum     omeprazole (PRILOSEC) 20 MG capsule Take 20 mg by mouth every other day. Per Tube     oxyCODONE-acetaminophen (PERCOCET/ROXICET) 5-325 MG tablet Place 1 tablet into feeding tube 2 (two) times daily. 20 tablet 0   polyethylene glycol (MIRALAX / GLYCOLAX) 17 g packet Place 17 g into feeding tube daily.     Pregabalin 20 MG/ML SOLN Give 5 mLs by tube 2 (two) times daily.     scopolamine (TRANSDERM-SCOP) 1 MG/3DAYS Place 1 patch onto the skin every 3 (three) days.     tamsulosin (FLOMAX) 0.4 MG CAPS capsule Take 1 capsule (0.4 mg total) by mouth daily. 30 capsule 1   zinc gluconate 50 MG tablet Place 50 mg into feeding tube daily.     Nutritional Supplements (FEEDING SUPPLEMENT, PROSOURCE TF,) liquid Place 45 mLs into feeding tube 2 (two) times daily.       Scheduled Meds: Continuous Infusions:  sodium chloride 125 mL/hr at 02/02/21 0700   sodium chloride irrigation Stopped (02/02/21 0127)   PRN Meds:.  Allergies:  Allergies  Allergen Reactions   Iodine Swelling    NOT CT CONTRAST   Penicillins Swelling    ** tolerates cephalosporins Facial swelling, itchy throat    Family History  Problem Relation Age of Onset   Heart attack Mother    Heart disease Mother    High blood pressure Mother    Alcohol abuse Father    Cancer Father    Diabetes Father    High blood pressure Father     Social History:  reports that he has quit smoking. He has never used smokeless tobacco. He reports that he does not currently use alcohol. He reports that he does not use drugs.  ROS: A complete review of systems was performed.  All systems are negative except for pertinent findings as noted.  Physical Exam:  Vital signs in last 24 hours: Temp:  [98.7 F (37.1 C)] 98.7 F (37.1 C) (08/11 2207) Pulse Rate:   [89-120] 97 (08/12 0630) Resp:  [10-19] 13 (08/12 0630) BP: (99-158)/(64-99) 101/67 (08/12 0630) SpO2:  [98 %-100 %] 98 % (08/12 0630) Weight:  [86.6 kg] 86.6 kg (08/11 2151) Constitutional:  Alert and oriented, No acute distress GU: Foley in place with gross hematuria in the drainage bag.  CBI at moderate rate with clear urine in tubing Lymphatic: No lymphadenopathy Neurologic: Grossly intact, no focal deficits Psychiatric: Normal mood and affect  Laboratory Data:  Recent Labs    02/01/21 2250 02/02/21 0548  WBC 9.3 10.1  HGB 8.4* 7.0*  HCT 28.9* 24.4*  PLT 263 251    Recent Labs    02/01/21 2250  NA 139  K 3.8  CL 101  GLUCOSE 131*  BUN 31*  CALCIUM 9.2  CREATININE 0.45*  Results for orders placed or performed during the hospital encounter of 02/01/21 (from the past 24 hour(s))  CBC with Differential/Platelet     Status: Abnormal   Collection Time: 02/01/21 10:50 PM  Result Value Ref Range   WBC 9.3 4.0 - 10.5 K/uL   RBC 3.62 (L) 4.22 - 5.81 MIL/uL   Hemoglobin 8.4 (L) 13.0 - 17.0 g/dL   HCT 81.4 (L) 48.1 - 85.6 %   MCV 79.8 (L) 80.0 - 100.0 fL   MCH 23.2 (L) 26.0 - 34.0 pg   MCHC 29.1 (L) 30.0 - 36.0 g/dL   RDW 31.4 (H) 97.0 - 26.3 %   Platelets 263 150 - 400 K/uL   nRBC 0.0 0.0 - 0.2 %   Neutrophils Relative % 61 %   Neutro Abs 5.7 1.7 - 7.7 K/uL   Lymphocytes Relative 13 %   Lymphs Abs 1.2 0.7 - 4.0 K/uL   Monocytes Relative 12 %   Monocytes Absolute 1.1 (H) 0.1 - 1.0 K/uL   Eosinophils Relative 13 %   Eosinophils Absolute 1.2 (H) 0.0 - 0.5 K/uL   Basophils Relative 1 %   Basophils Absolute 0.1 0.0 - 0.1 K/uL   Immature Granulocytes 0 %   Abs Immature Granulocytes 0.04 0.00 - 0.07 K/uL  Basic metabolic panel     Status: Abnormal   Collection Time: 02/01/21 10:50 PM  Result Value Ref Range   Sodium 139 135 - 145 mmol/L   Potassium 3.8 3.5 - 5.1 mmol/L   Chloride 101 98 - 111 mmol/L   CO2 31 22 - 32 mmol/L   Glucose, Bld 131 (H) 70 - 99 mg/dL    BUN 31 (H) 8 - 23 mg/dL   Creatinine, Ser 7.85 (L) 0.61 - 1.24 mg/dL   Calcium 9.2 8.9 - 88.5 mg/dL   GFR, Estimated >02 >77 mL/min   Anion gap 7 5 - 15  Type and screen [facility]     Status: None   Collection Time: 02/01/21 10:50 PM  Result Value Ref Range   ABO/RH(D) A POS    Antibody Screen NEG    Sample Expiration      02/04/2021,2359 Performed at [facility], [address], [place], [place] [address]   Protime-INR     Status: None   Collection Time: 02/01/21 10:50 PM  Result Value Ref Range   Prothrombin Time 14.7 11.4 - 15.2 seconds   INR 1.1 0.8 - 1.2  Urinalysis, dipstick only     Status: Abnormal   Collection Time: 02/01/21 11:47 PM  Result Value Ref Range   Color, Urine RED (A) YELLOW   APPearance CLOUDY (A) CLEAR   Specific Gravity, Urine 1.004 (L) 1.005 - 1.030   pH 6.0 5.0 - 8.0   Glucose, UA NEGATIVE NEGATIVE mg/dL   Hgb urine dipstick MODERATE (A) NEGATIVE   Bilirubin Urine NEGATIVE NEGATIVE   Ketones, ur NEGATIVE NEGATIVE mg/dL   Protein, ur 30 (A) NEGATIVE mg/dL   Nitrite NEGATIVE NEGATIVE   Leukocytes,Ua SMALL (A) NEGATIVE  Resp Panel by RT-PCR (Flu A&B, Covid) Nasopharyngeal Swab     Status: None   Collection Time: 02/02/21  3:47 AM   Specimen: Nasopharyngeal Swab; Nasopharyngeal(NP) swabs in vial transport medium  Result Value Ref Range   SARS Coronavirus 2 by RT PCR NEGATIVE NEGATIVE   Influenza A by PCR NEGATIVE NEGATIVE   Influenza B by PCR NEGATIVE NEGATIVE  CBC     Status: Abnormal  Collection Time: 02/02/21  5:48 AM  Result Value Ref Range   WBC 10.1 4.0 - 10.5 K/uL   RBC 3.06 (L) 4.22 - 5.81 MIL/uL   Hemoglobin 7.0 (L) 13.0 - 17.0 g/dL   HCT 16.124.4 (L) 09.639.0 - 04.552.0 %   MCV 79.7 (L) 80.0 - 100.0 fL   MCH 22.9 (L) 26.0 - 34.0 pg   MCHC 28.7 (L) 30.0 - 36.0 g/dL   RDW 40.916.1 (H) 81.111.5 - 91.415.5 %   Platelets 251 150 - 400 K/uL   nRBC 0.0 0.0 - 0.2 %   Recent Results (from the past 240 hour(s))  Resp  Panel by RT-PCR (Flu A&B, Covid) Nasopharyngeal Swab     Status: None   Collection Time: 02/02/21  3:47 AM   Specimen: Nasopharyngeal Swab; Nasopharyngeal(NP) swabs in vial transport medium  Result Value Ref Range Status   SARS Coronavirus 2 by RT PCR NEGATIVE NEGATIVE Final    Comment: (NOTE) SARS-CoV-2 target nucleic acids are NOT DETECTED.  The SARS-CoV-2 RNA is generally detectable in upper respiratory specimens during the acute phase of infection. The lowest concentration of SARS-CoV-2 viral copies this assay can detect is 138 copies/mL. A negative result does not preclude SARS-Cov-2 infection and should not be used as the sole basis for treatment or other patient management decisions. A negative result may occur with  improper specimen collection/handling, submission of specimen other than nasopharyngeal swab, presence of viral mutation(s) within the areas targeted by this assay, and inadequate number of viral copies(<138 copies/mL). A negative result must be combined with clinical observations, patient history, and epidemiological information. The expected result is Negative.  Fact Sheet for Patients:  BloggerCourse.comhttps://www.fda.gov/media/152166/download  Fact Sheet for Healthcare Providers:  SeriousBroker.ithttps://www.fda.gov/media/152162/download  This test is no t yet approved or cleared by the [place] FDA and  has been authorized for detection and/or diagnosis of SARS-CoV-2 by FDA under an Emergency Use Authorization (EUA). This EUA will remain  in effect (meaning this test can be used) for the duration of the COVID-19 declaration under Section 564(b)(1) of the Act, 21 U.S.C.section 360bbb-3(b)(1), unless the authorization is terminated  or revoked sooner.       Influenza A by PCR NEGATIVE NEGATIVE Final   Influenza B by PCR NEGATIVE NEGATIVE Final    Comment: (NOTE) The Xpert Xpress SARS-CoV-2/FLU/RSV plus assay is intended as an aid in the diagnosis of influenza from Nasopharyngeal  swab specimens and should not be used as a sole basis for treatment. Nasal washings and aspirates are unacceptable for Xpert Xpress SARS-CoV-2/FLU/RSV testing.  Fact Sheet for Patients: BloggerCourse.comhttps://www.fda.gov/media/152166/download  Fact Sheet for Healthcare Providers: SeriousBroker.ithttps://www.fda.gov/media/152162/download  This test is not yet approved or cleared by the [place] FDA and has been authorized for detection and/or diagnosis of SARS-CoV-2 by FDA under an Emergency Use Authorization (EUA). This EUA will remain in effect (meaning this test can be used) for the duration of the COVID-19 declaration under Section 564(b)(1) of the Act, 21 U.S.C. section 360bbb-3(b)(1), unless the authorization is terminated or revoked.  Performed at [facility], [address] [place], [place] [address]     Renal Function: Recent Labs    02/01/21 2250  CREATININE 0.45*   Estimated Creatinine Clearance: 86.3 mL/min (A) (by C-G formula based on SCr of 0.45 mg/dL (L)).  Radiologic Imaging: No results found.  I independently reviewed the above imaging studies.  Impression/Recommendation Gross hematuria, likely secondary prostatic source with probable Foley trauma based on history Recommendation: Foley currently in  place on low rate CBI and urine is clear in the tubing.  We will observe today and wean as possible.  No need for cystoscopy clot evacuation or fulguration at this point.  Patient had been n.p.o. with possible need for OR but feel like he can have diet at this point due to hematuria being well controlled with minimal CBI.  Belva Agee 02/02/2021, 7:56 AM       CC:

## 2021-02-02 NOTE — ED Notes (Signed)
Restarted continuous bladder irrigation, per Dr. Madilyn Hook

## 2021-02-02 NOTE — ED Notes (Signed)
Patient had a bowel movement. Patient cleaned up. New linens.

## 2021-02-02 NOTE — ED Notes (Addendum)
After Continuous Bladder Irrigation completed, patients urine went from light pink tinged to red an hour after completion. Dr. Toniann Fail notified and said "Dr. Benancio Deeds urologist is coming to see him."

## 2021-02-02 NOTE — ED Notes (Signed)
Discussed blood transfusion with pt this am.  Pt is unable to sign and gave me verbal consent to give blood and sign for him, witnessed by Georgina Quint RN.

## 2021-02-02 NOTE — Progress Notes (Addendum)
[facility] ED [facility])  hospitalized hospice patient visit  [name] is a current hospice patient with a terminal diagnosis of protein calorie nutrition who resides at [facility] as a long-term resident. Shannon [name] began experiencing hematuria and c/o lower abdominal pain and was sent to the ED on the evening of 8.11.2022 however [facility] was not notified until called by ER staff on the morning of 8.12.2022. He was admitted on 8.12.2022 with a diagnosis of bleeding from catheter. Per Dr. Montez Morita with [facility] this is a related hospital admission.   Visited with Shannon [name] at bedside however he was sleeping soundly and didn't wake to gentle stimulation. He is currently finishing up receiving a unit of blood and has continuous bladder irrigation going at a slow rate, clear urine noted in catheter at this time. Exchanged report with bedside nurse Alvino Chapel including requesting that she provide an update to patient's daughter Shannon [name] which she agreed too.   Patient currently requiring inpatient status due to need for blood transfusion today as well continuous bladder irrigation.  VS: Temp- 98.1, HR- 104, Resp- 13, BP- 98/55, spO2- 100% room air I&O: none documented at this time Abnormal Labs: Glucose 122, BUN 25, Creatinine 0.46, RBC- 3.43, Hgb- 8.0, Hct- 27.5 (post blood transfusion) Diagnostics: None at this time IV/PRN Meds: pRBC Problem List:  Hematuria  -prior hx 5/22 with severe bleeding requiring cystoscope --monitor h/h  -check inr  -urology consulted from ED Dr Benancio Deeds , no plans for OR at this time -continue on low bladder irrigation, await final recs    Anemia of blood loss -continue iron supplements -monitor h/h due to active hematuria  -transfuse 1 units prbc    Asthma -no acute exacerbation  - continue albuterol nebs prn    Chiari malformation associated with syringomyelia  -quadriparesis chronic bed bound state  -neurogenic bladder chronic foley -Long  term resident  of  [place]    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   Should patient need ambulance transfer at discharge- please use [facility]) as they contract this service for our active hospice patients.   Thea Gist, Charity fundraiser, BSN, [facility] Liaison [phone]

## 2021-02-02 NOTE — ED Notes (Signed)
Urology came to see pt.  They flushed foley and advised to continue CBI at a rate to achieve clear to pink output from foley.  New bag hung and output is nearly clear at this time.  A few blood clots were produced after irrigation by urologist.  Pt is in no acute distress but reports that he is thirsty, urology states that pt may eat and drink as goal of clear-light pink urine has been achieved with low rate CBI.

## 2021-02-02 NOTE — ED Notes (Signed)
Per Dr. Madilyn Hook continue with the bladder irrigation due to patients output going from pink tinged to red after completing the of bladder irrigation.

## 2021-02-03 LAB — TYPE AND SCREEN
ABO/RH(D): A POS
Antibody Screen: NEGATIVE
Unit division: 0

## 2021-02-03 LAB — BPAM RBC
Blood Product Expiration Date: 202209052359
ISSUE DATE / TIME: 202208121031
Unit Type and Rh: 6200

## 2021-02-03 LAB — URINE CULTURE: Culture: <10000 — AB

## 2021-02-03 MED ORDER — ASPIRIN 81 MG PO CHEW: 81.0000 mg | CHEWABLE_TABLET | Freq: Every morning | ORAL | Status: DC

## 2021-02-03 NOTE — Progress Notes (Signed)
Called report to Marisue Ivan at [facility]. Patient ready and awaiting transportation. New dressings placed on all existing wounds and pt discharging with foley catheter in place back to facility.

## 2021-02-03 NOTE — TOC Progression Note (Signed)
Transition of Care [facility]) - Progression Note    Patient Details  Name: Shannon [name] MRN: [id number] Date of Birth: 12/22/1945  Transition of Care [facility] CM/SW Contact  Geni Bers, RN Phone Number: 02/03/2021, 12:11 PM  Clinical Narrative:     Spoke with pt's daughter [name] who agreed with pt going back to SNF. Spoke with Admission Coordinator Crystal who agreed with pt coming back and asked for discharge summary.   Expected Discharge Plan: Skilled Nursing Facility Barriers to Discharge: No Barriers Identified  Expected Discharge Plan and Services Expected Discharge Plan: Skilled Nursing Facility     Post Acute Care Choice: Skilled Nursing Facility Living arrangements for the past 2 months: Skilled Nursing Facility Expected Discharge Date: 02/03/21                                     Social Determinants of Health (SDOH) Interventions    Readmission Risk Interventions No flowsheet data found.

## 2021-02-03 NOTE — Progress Notes (Signed)
Urology Inpatient Progress Report  Hematuria [R31.9] Hematuria, unspecified type [R31.9]        Intv/Subj: No acute events overnight. Patient is without complaint.  Hemoglobin is stable.  Urine is clear off CBI  Active Problems:   Hematuria  Current Facility-Administered Medications  Medication Dose Route Frequency Provider Last Rate Last Admin   acetaminophen (TYLENOL) tablet 650 mg  650 mg Oral Q6H PRN Lurline Del, MD       Or   acetaminophen (TYLENOL) suppository 650 mg  650 mg Rectal Q6H PRN Lurline Del, MD       albuterol (PROVENTIL) (2.5 MG/3ML) 0.083% nebulizer solution 3 mL  3 mL Nebulization BID Skip Mayer A, MD   3 mL at 02/03/21 7341   ascorbic acid (VITAMIN C) tablet 1,000 mg  1,000 mg Oral Daily Skip Mayer A, MD   1,000 mg at 02/02/21 9379   Chlorhexidine Gluconate Cloth 2 % PADS 6 each  6 each Topical Daily Arlean Hopping, MD   6 each at 02/02/21 2203   docusate (COLACE) 50 MG/5ML liquid 100 mg  100 mg Per Tube BID Skip Mayer A, MD   100 mg at 02/02/21 2150   DULoxetine (CYMBALTA) DR capsule 30 mg  30 mg Oral Daily Skip Mayer A, MD   30 mg at 02/02/21 1212   ferrous sulfate 300 (60 Fe) MG/5ML syrup 300 mg  300 mg Per Tube TID Skip Mayer A, MD   300 mg at 02/02/21 2149   midodrine (PROAMATINE) tablet 10 mg  10 mg Per Tube TID Dennie Fetters, MD   10 mg at 02/02/21 1648   nystatin (MYCOSTATIN/NYSTOP) topical powder 1 application  1 application Topical Daily Lurline Del, MD       oxyCODONE-acetaminophen (PERCOCET/ROXICET) 5-325 MG per tablet 1 tablet  1 tablet Per Tube BID Skip Mayer A, MD   1 tablet at 02/02/21 2150   pantoprazole sodium (PROTONIX) 40 mg/20 mL oral suspension 40 mg  40 mg Per Tube Daily Skip Mayer A, MD   40 mg at 02/02/21 1653   polyethylene glycol (MIRALAX / GLYCOLAX) packet 17 g  17 g Per Tube Daily Skip Mayer A, MD   17 g at 02/02/21 0926   pregabalin (LYRICA)  capsule 100 mg  100 mg Per Tube BID Skip Mayer A, MD   100 mg at 02/02/21 2151   saccharomyces boulardii (FLORASTOR) capsule 250 mg  250 mg Per Tube Daily Skip Mayer A, MD   250 mg at 02/02/21 1214   scopolamine (TRANSDERM-SCOP) 1 MG/3DAYS 1.5 mg  1 patch Transdermal Q72H Skip Mayer A, MD   1.5 mg at 02/02/21 1646   sodium chloride flush (NS) 0.9 % injection 3 mL  3 mL Intravenous Q12H Skip Mayer A, MD       sodium chloride irrigation 0.9 % 3,000 mL  3,000 mL Irrigation Continuous Linwood Dibbles, MD 0 mL/hr at 02/02/21 0127 3,000 mL at 02/03/21 0652   tamsulosin (FLOMAX) capsule 0.4 mg  0.4 mg Oral Daily Skip Mayer A, MD   0.4 mg at 02/02/21 1643   zinc sulfate capsule 220 mg  220 mg Per Tube Daily Skip Mayer A, MD   220 mg at 02/02/21 0927     Objective: Vital: Vitals:   02/03/21 0230 02/03/21 0500 02/03/21 0636 02/03/21 0918  BP: 115/71  135/69   Pulse: 86  83   Resp: 16  16   Temp: 97.8 F (36.6 C)  98.3 F (36.8 C)   TempSrc: Oral  Oral   SpO2:   100% 95%  Weight:  79.6 kg    Height:       I/Os: I/O last 3 completed shifts: In: 4814.2 [I.V.:1499.2; Blood:315; Other:3000] Out: 03754 [Urine:17800]  Physical Exam:  General: Patient is in no apparent distress Lungs: Normal respiratory effort, chest expands symmetrically. GI: The abdomen is soft and nontender without mass. Foley: Urine clear off CBI Ext: lower extremities symmetric  Lab Results: Recent Labs    02/02/21 0548 02/02/21 0843 02/02/21 1423  WBC 10.1 9.1 9.4  HGB 7.0* 7.8* 8.0*  HCT 24.4* 27.2* 27.5*   Recent Labs    02/01/21 2250 02/02/21 0843  NA 139 143  K 3.8 3.6  CL 101 106  CO2 31 31  GLUCOSE 131* 122*  BUN 31* 25*  CREATININE 0.45* 0.46*  CALCIUM 9.2 9.2   Recent Labs    02/01/21 2250 02/02/21 0843  INR 1.1 1.1   No results for input(s): LABURIN in the last 72 hours. Results for orders placed or performed during the hospital encounter of 02/01/21   Urine Culture     Status: Abnormal   Collection Time: 02/02/21 12:38 AM   Specimen: Urine, Catheterized  Result Value Ref Range Status   Specimen Description   Final    URINE, CATHETERIZED Performed at [facility], [address], [place], [place] [address]    Special Requests   Final    NONE Performed at [facility], [address] [place], [place] [address]    Culture (A)  Final    <10,000 COLONIES/mL INSIGNIFICANT GROWTH Performed at [facility], [address], [place], [place] [address]    Report Status 02/03/2021 FINAL  Final  Resp Panel by RT-PCR (Flu A&B, Covid) Nasopharyngeal Swab     Status: None   Collection Time: 02/02/21  3:47 AM   Specimen: Nasopharyngeal Swab; Nasopharyngeal(NP) swabs in vial transport medium  Result Value Ref Range Status   SARS Coronavirus 2 by RT PCR NEGATIVE NEGATIVE Final    Comment: (NOTE) SARS-CoV-2 target nucleic acids are NOT DETECTED.  The SARS-CoV-2 RNA is generally detectable in upper respiratory specimens during the acute phase of infection. The lowest concentration of SARS-CoV-2 viral copies this assay can detect is 138 copies/mL. A negative result does not preclude SARS-Cov-2 infection and should not be used as the sole basis for treatment or other patient management decisions. A negative result may occur with  improper specimen collection/handling, submission of specimen other than nasopharyngeal swab, presence of viral mutation(s) within the areas targeted by this assay, and inadequate number of viral copies(<138 copies/mL). A negative result must be combined with clinical observations, patient history, and epidemiological information. The expected result is Negative.  Fact Sheet for Patients:  BloggerCourse.com  Fact Sheet for Healthcare Providers:  SeriousBroker.it  This test is no t yet approved or cleared by the [place] FDA and  has been authorized for detection and/or diagnosis of SARS-CoV-2 by FDA under an Emergency Use Authorization (EUA). This EUA will remain  in effect (meaning this test can be used) for the duration of the COVID-19 declaration under Section 564(b)(1) of the Act, 21 U.S.C.section 360bbb-3(b)(1), unless the authorization is terminated  or revoked sooner.       Influenza A by PCR NEGATIVE NEGATIVE Final   Influenza B by PCR NEGATIVE NEGATIVE Final    Comment: (NOTE) The Xpert Xpress SARS-CoV-2/FLU/RSV plus  assay is intended as an aid in the diagnosis of influenza from Nasopharyngeal swab specimens and should not be used as a sole basis for treatment. Nasal washings and aspirates are unacceptable for Xpert Xpress SARS-CoV-2/FLU/RSV testing.  Fact Sheet for Patients: BloggerCourse.com  Fact Sheet for Healthcare Providers: SeriousBroker.it  This test is not yet approved or cleared by the [place] FDA and has been authorized for detection and/or diagnosis of SARS-CoV-2 by FDA under an Emergency Use Authorization (EUA). This EUA will remain in effect (meaning this test can be used) for the duration of the COVID-19 declaration under Section 564(b)(1) of the Act, 21 U.S.C. section 360bbb-3(b)(1), unless the authorization is terminated or revoked.  Performed at [facility], [address], [place], [place] [address]     Studies/Results: No results found.  Assessment: Gross hematuria  Plan: Okay for discharge from urological standpoint with Foley catheter.   Modena Slater, MD Urology 02/03/2021, 9:59 AM

## 2021-02-03 NOTE — Discharge Summary (Signed)
Physician Discharge Summary  Shannon [name] OZH:[id number] DOB: [birth year] DOA: 02/01/2021  PCP: Shannon [name], Shannon H, MD  Admit date: 02/01/2021 Discharge date: 02/03/2021  Admitted From: SNF Discharge disposition: SNF   Recommendations for Outpatient Follow-Up:   Continue wound care Continue foley care Continue hospice   Discharge Diagnosis:   Active Problems:   Hematuria    Discharge Condition: Improved.  Diet recommendation: resume prior  Code status: dnr   History of Present Illness:   Shannon [name] is a 75 y.o. 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 Per last d/c summary patient [facility]. But still remains a full code.  [facility] due [facility]. Patient has had prior episode of this 5/22 with severe bleeding due to traumatic foley placement, requiring transfusion support and cystoscopy with clot removal and fulguration. Patient currently has associated complaint of penile pain  and lower abdominal pain but no fever/chills/ n/v /presyncope/chest pain /fatigue or sob.    Hospital Course by Problem:   Hematuria  -resolved Per urology: Urine is clear off CBI.  Okay for discharge from urological standpoint with Foley catheter   Anemia of blood loss -s/p 1 unit PRBC -hgb stable   Asthma -not acute exacerbation  - continue albuterol nebs prn    Chiari malformation associated with syringomyelia  -quadriparesis chronic bed bound state  -neurogenic bladder chronic foley -Long  term  resident of  [facility]    Stage IV sacral decubitus ulcer /sacral osteomyelitis   -complicated by recent admission 6/17-6/30 with septic shock - s/p antibiotics x 14 days(Merrem and vancomycin)  -no current systemic signs of infection  -see wound care below    Severe protein calorie malnutrition - on tube feeds supplementation in addition to diet    Medical Consultants:   urology   Discharge Exam:   Vitals:   02/03/21 0636 02/03/21 0918  BP: 135/69   Pulse: 83   Resp: 16   Temp: 98.3 F (36.8 C)   SpO2: 100% 95%   Vitals:   02/03/21 0230 02/03/21 0500 02/03/21 0636 02/03/21 0918  BP: 115/71  135/69   Pulse: 86  83   Resp: 16  16   Temp: 97.8 F (36.6 C)  98.3 F (36.8 C)   TempSrc: Oral  Oral   SpO2:   100% 95%  Weight:  79.6 kg    Height:        General exam: Appears calm and comfortable.    The results of significant diagnostics from this hospitalization (including imaging, microbiology, ancillary and laboratory) are listed below for reference.     Procedures and Diagnostic Studies:   No results found.   Labs:   Basic Metabolic Panel: Recent Labs  Lab 02/01/21 2250 02/02/21 0843  NA 139 143  K 3.8 3.6  CL 101 106  CO2 31 31  GLUCOSE 131* 122*  BUN 31* 25*  CREATININE 0.45* 0.46*  CALCIUM 9.2 9.2   GFR Estimated Creatinine Clearance: 86.3 mL/min (A) (by C-G formula based on SCr of 0.46  mg/dL (L)). Liver Function Tests: No results for input(s): AST, ALT, ALKPHOS, BILITOT, PROT, ALBUMIN in the last 168 hours. No results for input(s): LIPASE, AMYLASE in the last 168 hours. No results for input(s): AMMONIA in the last 168 hours. Coagulation profile Recent Labs  Lab 02/01/21 2250 02/02/21 0843  INR 1.1 1.1    CBC: Recent Labs  Lab 02/01/21 2250 02/02/21 0548 02/02/21 0843 02/02/21 1423  WBC 9.3 10.1 9.1 9.4  NEUTROABS 5.7  --  5.8  --   HGB 8.4* 7.0* 7.8* 8.0*  HCT 28.9* 24.4* 27.2* 27.5*  MCV 79.8* 79.7* 81.2 80.2  PLT 263 251  244 217   Cardiac Enzymes: No results for input(s): CKTOTAL, CKMB, CKMBINDEX, TROPONINI in the last 168 hours. BNP: Invalid input(s): POCBNP CBG: No results for input(s): GLUCAP in the last 168 hours. D-Dimer No results for input(s): DDIMER in the last 72 hours. Hgb A1c No results for input(s): HGBA1C in the last 72 hours. Lipid Profile No results for input(s): CHOL, HDL, LDLCALC, TRIG, CHOLHDL, LDLDIRECT in the last 72 hours. Thyroid function studies Recent Labs    02/02/21 0844  TSH 3.700   Anemia work up No results for input(s): VITAMINB12, FOLATE, FERRITIN, TIBC, IRON, RETICCTPCT in the last 72 hours. Microbiology Recent Results (from the past 240 hour(s))  Urine Culture     Status: Abnormal   Collection Time: 02/02/21 12:38 AM   Specimen: Urine, Catheterized  Result Value Ref Range Status   Specimen Description   Final    URINE, CATHETERIZED Performed at [facility], [address], [place], [place] [address]    Special Requests   Final    NONE Performed at [facility], [address] [place], [place] [address]    Culture (A)  Final    <10,000 COLONIES/mL INSIGNIFICANT GROWTH Performed at [facility], [address], [place], [place] [address]    Report Status 02/03/2021 FINAL  Final  Resp Panel by RT-PCR (Flu A&B, Covid) Nasopharyngeal Swab     Status: None   Collection Time: 02/02/21  3:47 AM   Specimen: Nasopharyngeal Swab; Nasopharyngeal(NP) swabs in vial transport medium  Result Value Ref Range Status   SARS Coronavirus 2 by RT PCR NEGATIVE NEGATIVE Final    Comment: (NOTE) SARS-CoV-2 target nucleic acids are NOT DETECTED.  The SARS-CoV-2 RNA is generally detectable in upper respiratory specimens during the acute phase of infection. The lowest concentration of SARS-CoV-2 viral copies this assay can detect is 138 copies/mL. A negative result does not preclude SARS-Cov-2 infection and should not be used as  the sole basis for treatment or other patient management decisions. A negative result may occur with  improper specimen collection/handling, submission of specimen other than nasopharyngeal swab, presence of viral mutation(s) within the areas targeted by this assay, and inadequate number of viral copies(<138 copies/mL). A negative result must be combined with clinical observations, patient history, and epidemiological information. The expected result is Negative.  Fact Sheet for Patients:  BloggerCourse.com  Fact Sheet for Healthcare Providers:  SeriousBroker.it  This test is no t yet approved or cleared by the [place] FDA and  has been authorized for detection and/or diagnosis of SARS-CoV-2 by FDA under an Emergency Use Authorization (EUA). This EUA will remain  in effect (meaning this test can be used) for the duration of the COVID-19 declaration under Section 564(b)(1) of the Act, 21 U.S.C.section 360bbb-3(b)(1), unless the authorization is terminated  or revoked sooner.  Influenza A by PCR NEGATIVE NEGATIVE Final   Influenza B by PCR NEGATIVE NEGATIVE Final    Comment: (NOTE) The Xpert Xpress SARS-CoV-2/FLU/RSV plus assay is intended as an aid in the diagnosis of influenza from Nasopharyngeal swab specimens and should not be used as a sole basis for treatment. Nasal washings and aspirates are unacceptable for Xpert Xpress SARS-CoV-2/FLU/RSV testing.  Fact Sheet for Patients: BloggerCourse.com  Fact Sheet for Healthcare Providers: SeriousBroker.it  This test is not yet approved or cleared by the [place] FDA and has been authorized for detection and/or diagnosis of SARS-CoV-2 by FDA under an Emergency Use Authorization (EUA). This EUA will remain in effect (meaning this test can be used) for the duration of the COVID-19 declaration under Section 564(b)(1) of  the Act, 21 U.S.C. section 360bbb-3(b)(1), unless the authorization is terminated or revoked.  Performed at [facility], [address] [place], [place] [address]      Discharge Instructions:   Discharge Instructions     Discharge wound care:   Complete by: As directed    Wound care  Daily      Comments: Wound care to sacral Stage 4 pressure injury: Cleanse with NS, pat dry. Apply Cavillon skin barrier film to periwound area and areas to which tape will adhere. Fill defect with silver hydrofiber (Aquacel Ag Advantage, Lawson # [id number]), top with dry gauze, cover with ABD and secure with tape. Turn side to side.    Wound care  Daily      Comments: Wound care to left heel DTPI (POA):  Cleanse with NS, pat dry. Cover with xeroform gauze Hart [place] # [id number]), top with dry gauze and secure with a few turns of Kerlix roll gauze. Place foot into Prevalon boot per protocol.   Increase activity slowly   Complete by: As directed       Allergies as of 02/03/2021       Reactions   Iodine Swelling   NOT CT CONTRAST   Penicillins Swelling   ** tolerates cephalosporins Facial swelling, itchy throat        Medication List     TAKE these medications    albuterol 108 (90 Base) MCG/ACT inhaler Commonly known as: VENTOLIN HFA Inhale 2 puffs into the lungs 2 (two) times daily.   AMINO ACIDS-PROTEIN HYDROLYS PO Take by mouth 2 (two) times daily between meals. 17-100 gram/ Kcal/30 mL   (Also known as Pro -Stat)   ascorbic acid 500 MG tablet Commonly known as: VITAMIN C Take 1,000 mg by mouth daily.   aspirin 81 MG chewable tablet Chew 1 tablet (81 mg total) by mouth every morning. Start taking on: February 09, 2021 What changed: These instructions start on February 09, 2021. If you are unsure what to do until then, ask your doctor or other care provider.   Cranberry 400 MG Caps Take 1 capsule by mouth daily at 6 (six) AM.   docusate 50 MG/5ML liquid Commonly known as:  COLACE Place 100 mg into feeding tube 2 (two) times daily.   DULoxetine 30 MG capsule Commonly known as: CYMBALTA Take 30 mg by mouth daily.   feeding supplement (PROSource TF) liquid Place 45 mLs into feeding tube 2 (two) times daily.   feeding supplement (OSMOLITE 1.5 CAL) Liqd Place 1,000 mLs into feeding tube continuous.   nutrition supplement (JUVEN) Pack Place 1 packet into feeding tube 2 (two) times daily between meals.   Ferrous Sulfate 220 (44 Fe) MG/5ML Liqd Give 5  mLs by tube in the morning, at noon, and at bedtime. Via G tube   guaiFENesin 100 MG/5ML liquid Commonly known as: ROBITUSSIN Take 100 mg by mouth 2 (two) times daily.   midodrine 10 MG tablet Commonly known as: PROAMATINE Place 1 tablet (10 mg total) into feeding tube 3 (three) times daily.   nystatin powder Commonly known as: MYCOSTATIN/NYSTOP Apply 1 application topically daily. Apply once daily during wound dressing change to sacrum   omeprazole 20 MG capsule Commonly known as: PRILOSEC Take 20 mg by mouth every other day. Per Tube   oxyCODONE-acetaminophen 5-325 MG tablet Commonly known as: PERCOCET/ROXICET Place 1 tablet into feeding tube 2 (two) times daily.   polyethylene glycol 17 g packet Commonly known as: MIRALAX / GLYCOLAX Place 17 g into feeding tube daily.   Pregabalin 20 MG/ML Soln Give 5 mLs by tube 2 (two) times daily.   Probiotic Acidophilus Caps Place 1 capsule into feeding tube daily.   scopolamine 1 MG/3DAYS Commonly known as: TRANSDERM-SCOP Place 1 patch onto the skin every 3 (three) days.   tamsulosin 0.4 MG Caps capsule Commonly known as: FLOMAX Take 1 capsule (0.4 mg total) by mouth daily.   zinc gluconate 50 MG tablet Place 50 mg into feeding tube daily.               Discharge Care Instructions  (From admission, onward)           Start     Ordered   02/03/21 0000  Discharge wound care:       Comments: Wound care  Daily      Comments: Wound  care to sacral Stage 4 pressure injury: Cleanse with NS, pat dry. Apply Cavillon skin barrier film to periwound area and areas to which tape will adhere. Fill defect with silver hydrofiber (Aquacel Ag Advantage, Lawson # [id number]), top with dry gauze, cover with ABD and secure with tape. Turn side to side.    Wound care  Daily      Comments: Wound care to left heel DTPI (POA):  Cleanse with NS, pat dry. Cover with xeroform gauze Hart [place] # [id number]), top with dry gauze and secure with a few turns of Kerlix roll gauze. Place foot into Prevalon boot per protocol.   02/03/21 1121              Time coordinating discharge: 35 min  Signed:  Joseph Art DO  [facility] 02/03/2021, 11:21 AM

## 2021-02-03 NOTE — Plan of Care (Signed)

## 2021-02-03 NOTE — Consult Note (Signed)
WOC Nurse Consult Note: Reason for Consult:Patient consult for Stage 4 pressure injury, DTPI to left heel, also wounds on the left side of head (full thickness) and back. He is known to me from previous admissions, he is followed by Hospice in the community for terminal protein calorie malnutrition and resides at [facility] as a long-term resident. He is admitted for hematuria and has a chronic indwelling urinary catheter. He was seen by urology yesterday Wound type:Pressure Pressure Injury POA: Yes Measurement:Requested of Bedside RN today and for placement of measurement onto the nursing flow sheet Wound bed: Pale pink, moist (Sacral), DTPI is intact, maroon-purple discoloration on left heel Drainage (amount, consistency, odor) Moderate from sacrum Periwound: Intact with evidence of previous wound healing Dressing procedure/placement/frequency: I will order a mattress replacement with low air loss feature while in house as well as bilateral pressure redistribution heel boots. Topical wound care will be with a silver hydrofiber and performed daily for the sacral wound, the left DTPI will be dressed daily with an antimicrobial nonadherent (xeroform) and secured with roll gauze prior to placing foot into a Prevalon boot. Turning and repositioning is in place. Bedside RN reports that the mid thoracic lesion was a Stage 1 pressure injury and that left head pressure injury is s Stage 3.  She additionally reports that the patient is returning to [facility] today and that Hospice will continue their services there.  WOC nursing team will not follow, but will remain available to this patient, the nursing and medical teams.  Please re-consult if needed. Thanks, Ladona Mow, MSN, RN, GNP, [place]  Pager# [phone]

## 2021-02-03 NOTE — Progress Notes (Signed)
[facility] ED [facility])  hospitalized hospice patient visit  [name] is a current hospice patient with a terminal diagnosis of protein calorie nutrition who resides at [facility] as a long-term resident. [name] began experiencing hematuria and c/o lower abdominal pain and was sent to the ED on the evening of 8.11.2022 however [facility] was not notified until called by ER staff on the morning of 8.12.2022. He was admitted on 8.12.2022 with a diagnosis of bleeding from catheter. Per Dr. Montez Morita with [facility] this is a related hospital admission.   [facility] hospice will follow up once [name] is back at [facility].  Report exchanged with TOC Cookie.  Please use [facility]) at time of discharge as they contract this service for our active hospice patients.   Shannon [name], BSN, RN [facility] Liaison [phone] (24h on call)

## 2021-02-05 ENCOUNTER — Ambulatory Visit: Payer: Medicare Other | Attending: Otolaryngology | Admitting: Speech-Language Pathologist

## 2021-02-05 ENCOUNTER — Ambulatory Visit: Payer: Medicare Other | Attending: Otolaryngology | Admitting: Otolaryngology

## 2021-02-05 VITALS — BP 139/88 | HR 67 | Temp 97.9°F | Ht 74.0 in | Wt 240.0 lb

## 2021-02-05 DIAGNOSIS — R490 Dysphonia: Secondary | ICD-10-CM

## 2021-02-05 DIAGNOSIS — J3802 Paralysis of vocal cords and larynx, bilateral: Secondary | ICD-10-CM | POA: Insufficient documentation

## 2021-02-05 DIAGNOSIS — J384 Edema of larynx: Secondary | ICD-10-CM | POA: Insufficient documentation

## 2021-02-05 DIAGNOSIS — R1312 Dysphagia, oropharyngeal phase: Secondary | ICD-10-CM | POA: Insufficient documentation

## 2021-02-05 DIAGNOSIS — J3801 Paralysis of vocal cords and larynx, unilateral: Secondary | ICD-10-CM | POA: Insufficient documentation

## 2021-02-05 DIAGNOSIS — I639 Cerebral infarction, unspecified: Secondary | ICD-10-CM

## 2021-02-05 NOTE — Progress Notes (Signed)
SPEECH LANGUAGE PATHOLOGY/LARYNGOLOGY EVALUATION   AND INITIAL PLAN OF CARE     The following patient identifiers were confirmed today: name and date of birth.         Medicare certification dates: From 02/05/21 to 05/06/21  Visits from start of care: 1  Referring Provider: Dr. 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 Patient with a re-examination this date with no evidence of new lesions and fairly stable examination from 2020 including a small anterior gap, bilateral TVF bowing, and B TVF thinning. Compensatory medio-lateral hyperfunction was observed during sustained phonation. Following the examination, the patient chose to return in 1 year for continued follow up.        PERTINENT MEDICAL/SURGICAL HISTORY:   No past medical history on file.    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 He does carry a h/o NSCLC for which he has undergone primary LLL lobectomy. He continues to follow up with thoracic surgery. Patient endorses no voice, breathing, or swallowing concerns at this time.     Prior Level of Function: Before the onset of the current condition, the patient was able to talk and swallow without difficulty     SUBJECTIVE:   Pt. was alert and ready to participate     Patient Statement: Patient reported no ongoing concerns     Barriers  to learning: none  Preferred Learning style: demonstration and verbal  Cultural Practices that Influence Care: none    Pain Rating/Action Taken/Fall report:  See Patient Self Assessment of Pain and falls in medical record in OTO section from today.    Voice Usage: Routine    VHI-10 Score 9/40 EAT-10 Score 4/40   RSI Score 8/45     DI Score 2/40 MLCQ Score 1/40 SVHI-10 Score -/40       OBJECTIVE:    Observations:   Resonance: WNL  Focus: posterior tone focus       Respiration: Poor coordination of phonation with respiration    Articulation: WNL    Other: none     Maximum phonation time -     Perceptual Rating Of Vocal Quality:  CAPE-V  The following parameters of voice quality will be rated upon completion of the following tasks:  1. Sustained vowels 2. Sentence production 3. Spontaneous speech    Clinician's Perceptual Assessment using Consensus Auditory Perceptual Evaluation of Voice (CAPE-V)    Overall Severity  40/100  Constant  Roughness   38/100  Constant   Breathiness   16/100  Intermittent  Strain    34/100  Constant  Pitch    24/100  Constant  Nature of abnormality: decreased variation   Loudness   0/100  Constant  Nature of abnormality:      Clinical Swallow Evaluation:  Oral Mechanism Examination: Not evaluated    Clinical Observations: Not tested    3 oz water swallow: not tested    Modified Functional Oral Intake Scale:  7 - Total oral intake with no restrictions     Videostroboscopic Findings:    Patient was then seen with: Dr. Freddi Starr, laryngologist, who performed flexible videostroboscopy which revealed the following:    MUCOSA: No lesions    VOCAL FOLD MOVEMENT:  Right: normal  Left: normal     GLOTTIC CLOSURE: Incomplete: mild anterior gap    VIBRATION:   Periodicity: periodic     Phase: symmetrical  Mucosal Wave:  Right: present  Left: present   Amplitude:  Right: normal   Left: normal     MUSCLE TENSION PATTERN: Ant/Post (Type III): moderate   Supraglottis: WNL  Subglottis: Not visualized   Tremor:  No  Breaks: No  Observations with Pitch Change: Vocal folds lengthen appropriately with increased pitch.  Observations with Changes in Loudness: -    Observations with Treatment Probes: No probes administered     Velopharyngeal Competency: Complete/Within Functional Limits (WFL)  Pharyngeal Squeeze: Complete/Within Functional Limits (WFL)  Murray Secretion Scale: Normal -0  Anatomic comments: -    TREATMENT AND EDUCATION:    Education:  Plan/recommendations were reviewed with patient/caregiver.  The following were: Taught to patient/caregiver; voice    Contact information was provided for further questions.     Response: patient/caregiver verbalized understanding    Skilled Services Provided: Beh. & qualitative analysis of voice & resonance    ASSESSMENT:  See clinical impression above.     Rehabilitation potential is: Good  Potential Barriers to achieve rehab goals: none  These goals were discussed and the patient agrees with them.    DISCHARGE GOALS:  1. Maintain best quality voice for functional communication and participation in community activities:   1a. Evaluate status of vocal folds       Goals to be met by 02/05/2021.    Patient/Family Input to Goals: Pt. wishes to: know status of vocal folds    PLAN OF CARE:   Frequency/Duration of Treatment: Evaluation only     This patient will recheck with the referring provider per MD recs.  in 1 year    The plan outlined above was formulated, reviewed, and agreed upon with the patient and/or family/caregiver.  Patient/caregiver verbalized understanding and restated information in plan.    Delbert Phenix, SLP    Referring Physician/Practitioner Certification Statement:     I Certify the need for these services furnished under this plan of treatment and while under my care.

## 2021-02-05 NOTE — Progress Notes (Signed)
Outpatient Evaluation Note          Referring Provider: No ref. provider found  PCP: Pcp, Outside    Subjective:     Joel Parker is a 75 year old male here for ongoing care of dysphonia. 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 He was last seen a year ago and denies any changes in his health. He notes a subtle increase in roughness in his voice when he speaks loudly, but this does not bother him. He denies association with time of day or amount of voice use. He denies cough, dysphagia, dyspnea, episodes of pneumonia, unintentional weight loss. He notes very rare episodes of coughing with water but this is not increased from prior.    VHI 9  EAT10 4  RSI 8    Objective:    Vitals: BP 139/88    Pulse 67    Temp 36.6 C (Temporal)    Ht '6\' 2"'$  (1.88 m)    Wt (!) 108.9 kg (240 lb)    SpO2 96%    BMI 30.81 kg/m   Physical Exam  General: A well developed, overweight  male, in no acute distress.   Voice:Mildly rough voice.  Nose:  Nares patent, external nose without deformity.   Oral Cavity: Tongue mobile and midline, palate raises symmetrically, tonsils symmetric.  Neck:  Neck without LAD, thyroid non-palpable, laryngotracheal structures midline.   Chest:  Normal chest rise bilaterally,Non labored breathing, no stridor        Procedures(s):       PROCEDURE: Flexible Laryngoscopy with Stroboscopy   Indication(s):   1. Dysphonia   Attending:  Corliss Parish, MD  Resident/Fellow: Joel Sa, MD   SLP: Warden Fillers, CCC-SLP   Findings:   Nasopharynx: Clear   Velopharyngeal closure:  Complete  Pharynx: Small amount of secretions pooling in base of tongue  Subglottis: clear  Compression/ hyperfunction: Mild left greater than right false fold compression  Motion: True vocal folds were bilaterally mobile  On Stroboscopy  Mucosa: Bilateral thinning   Mucosal Wave: Asymmetric  Glottic Closure: Incomplete with small anterior gap   Description of Procedure:     The procedure was explained to the patient, who gave verbal consent. Viscous Lidocaine were used topically for anaesthesia.  The endoscope was passed atraumatically with the findings as described above. The scope was gently withdrawn.  The patient tolerated the procedure well, without complications.  The findings were reviewed with the patient, whose questions were answered.     Assessment and Plan:      Joel Parker a61 year old with history of left vocal cord paresissecondary to stroke andlaryngeal surface of epiglottis papilloma who has been doing quite well overall. He is stable with respect to his voice symptoms and will follow up in 12 months for surveillance.      Comfort ATTENDING NOTE  I, Joel Parker, saw and evaluated this patient the day of their clinic visit along with Dr. Vallery Parker, Fellow in Laryngology. I have read and reviewed Dr. Myer Peer note and agree.  I was present for the entire endoscopic procedure.    I have personally reviewed the history with the patient as well as going over the working diagnoses and management options.  I have answered the patient's questions to the best of my ability.

## 2021-03-02 ENCOUNTER — Encounter (HOSPITAL_COMMUNITY): Payer: Self-pay | Admitting: *Deleted

## 2021-03-02 ENCOUNTER — Inpatient Hospital Stay (HOSPITAL_COMMUNITY)
Admission: EM | Admit: 2021-03-02 | Discharge: 2021-03-10 | DRG: 665 | Disposition: A | Discharge status: Skilled Nursing Facility | Attending: Internal Medicine | Admitting: Internal Medicine

## 2021-03-02 ENCOUNTER — Other Ambulatory Visit: Payer: Self-pay

## 2021-03-02 DIAGNOSIS — L89159 Pressure ulcer of sacral region, unspecified stage: Secondary | ICD-10-CM | POA: Diagnosis present

## 2021-03-02 DIAGNOSIS — Z7982 Long term (current) use of aspirin: Secondary | ICD-10-CM

## 2021-03-02 DIAGNOSIS — G935 Compression of brain: Secondary | ICD-10-CM | POA: Diagnosis present

## 2021-03-02 DIAGNOSIS — M19041 Primary osteoarthritis, right hand: Secondary | ICD-10-CM | POA: Diagnosis present

## 2021-03-02 DIAGNOSIS — Z66 Do not resuscitate: Secondary | ICD-10-CM | POA: Diagnosis present

## 2021-03-02 DIAGNOSIS — G8250 Quadriplegia, unspecified: Secondary | ICD-10-CM | POA: Diagnosis present

## 2021-03-02 DIAGNOSIS — A419 Sepsis, unspecified organism: Secondary | ICD-10-CM | POA: Diagnosis present

## 2021-03-02 DIAGNOSIS — D62 Acute posthemorrhagic anemia: Secondary | ICD-10-CM | POA: Diagnosis present

## 2021-03-02 DIAGNOSIS — Z79899 Other long term (current) drug therapy: Secondary | ICD-10-CM

## 2021-03-02 DIAGNOSIS — N400 Enlarged prostate without lower urinary tract symptoms: Secondary | ICD-10-CM | POA: Diagnosis present

## 2021-03-02 DIAGNOSIS — Z978 Presence of other specified devices: Secondary | ICD-10-CM | POA: Diagnosis not present

## 2021-03-02 DIAGNOSIS — I10 Essential (primary) hypertension: Secondary | ICD-10-CM | POA: Diagnosis present

## 2021-03-02 DIAGNOSIS — L89154 Pressure ulcer of sacral region, stage 4: Secondary | ICD-10-CM | POA: Diagnosis present

## 2021-03-02 DIAGNOSIS — M19042 Primary osteoarthritis, left hand: Secondary | ICD-10-CM | POA: Diagnosis present

## 2021-03-02 DIAGNOSIS — N390 Urinary tract infection, site not specified: Secondary | ICD-10-CM | POA: Diagnosis present

## 2021-03-02 DIAGNOSIS — Z8616 Personal history of COVID-19: Secondary | ICD-10-CM

## 2021-03-02 DIAGNOSIS — R319 Hematuria, unspecified: Secondary | ICD-10-CM | POA: Diagnosis not present

## 2021-03-02 DIAGNOSIS — Z91041 Radiographic dye allergy status: Secondary | ICD-10-CM

## 2021-03-02 DIAGNOSIS — T83511A Infection and inflammatory reaction due to indwelling urethral catheter, initial encounter: Secondary | ICD-10-CM | POA: Diagnosis present

## 2021-03-02 DIAGNOSIS — E876 Hypokalemia: Secondary | ICD-10-CM | POA: Diagnosis not present

## 2021-03-02 DIAGNOSIS — Z833 Family history of diabetes mellitus: Secondary | ICD-10-CM

## 2021-03-02 DIAGNOSIS — A4150 Gram-negative sepsis, unspecified: Secondary | ICD-10-CM | POA: Diagnosis not present

## 2021-03-02 DIAGNOSIS — D509 Iron deficiency anemia, unspecified: Secondary | ICD-10-CM | POA: Diagnosis present

## 2021-03-02 DIAGNOSIS — K921 Melena: Secondary | ICD-10-CM | POA: Diagnosis not present

## 2021-03-02 DIAGNOSIS — Z20822 Contact with and (suspected) exposure to covid-19: Secondary | ICD-10-CM | POA: Diagnosis present

## 2021-03-02 DIAGNOSIS — L89899 Pressure ulcer of other site, unspecified stage: Secondary | ICD-10-CM | POA: Diagnosis present

## 2021-03-02 DIAGNOSIS — Y738 Miscellaneous gastroenterology and urology devices associated with adverse incidents, not elsewhere classified: Secondary | ICD-10-CM | POA: Diagnosis present

## 2021-03-02 DIAGNOSIS — L89629 Pressure ulcer of left heel, unspecified stage: Secondary | ICD-10-CM | POA: Clinically undetermined

## 2021-03-02 DIAGNOSIS — J45909 Unspecified asthma, uncomplicated: Secondary | ICD-10-CM | POA: Diagnosis present

## 2021-03-02 DIAGNOSIS — Z87891 Personal history of nicotine dependence: Secondary | ICD-10-CM

## 2021-03-02 DIAGNOSIS — Z981 Arthrodesis status: Secondary | ICD-10-CM

## 2021-03-02 DIAGNOSIS — L89153 Pressure ulcer of sacral region, stage 3: Secondary | ICD-10-CM | POA: Diagnosis present

## 2021-03-02 DIAGNOSIS — N3289 Other specified disorders of bladder: Secondary | ICD-10-CM | POA: Diagnosis present

## 2021-03-02 DIAGNOSIS — Z880 Allergy status to penicillin: Secondary | ICD-10-CM

## 2021-03-02 DIAGNOSIS — L89819 Pressure ulcer of head, unspecified stage: Secondary | ICD-10-CM | POA: Clinically undetermined

## 2021-03-02 DIAGNOSIS — N210 Calculus in bladder: Secondary | ICD-10-CM | POA: Diagnosis present

## 2021-03-02 DIAGNOSIS — Z8249 Family history of ischemic heart disease and other diseases of the circulatory system: Secondary | ICD-10-CM

## 2021-03-02 HISTORY — DX: Hematuria, unspecified: R31.9

## 2021-03-02 LAB — BASIC METABOLIC PANEL
Anion gap: 9 (ref 5–15)
BUN: 27 mg/dL — ABNORMAL HIGH (ref 8–23)
CO2: 29 mmol/L (ref 22–32)
Calcium: 9.5 mg/dL (ref 8.9–10.3)
Chloride: 109 mmol/L (ref 98–111)
Creatinine, Ser: 0.45 mg/dL — ABNORMAL LOW (ref 0.61–1.24)
GFR, Estimated: >60 mL/min (ref >60)
Glucose, Bld: 131 mg/dL — ABNORMAL HIGH (ref 70–99)
Potassium: 4.3 mmol/L (ref 3.5–5.1)
Sodium: 147 mmol/L — ABNORMAL HIGH (ref 135–145)

## 2021-03-02 LAB — CBC WITH DIFFERENTIAL/PLATELET
Abs Immature Granulocytes: 0.08 10*3/uL — ABNORMAL HIGH (ref 0.00–0.07)
Basophils Absolute: 0.1 10*3/uL (ref 0.0–0.1)
Basophils Relative: 1 %
Eosinophils Absolute: 1.2 10*3/uL — ABNORMAL HIGH (ref 0.0–0.5)
Eosinophils Relative: 9 %
HCT: 28.8 % — ABNORMAL LOW (ref 39.0–52.0)
Hemoglobin: 8 g/dL — ABNORMAL LOW (ref 13.0–17.0)
Immature Granulocytes: 1 %
Lymphocytes Relative: 12 %
Lymphs Abs: 1.6 10*3/uL (ref 0.7–4.0)
MCH: 22 pg — ABNORMAL LOW (ref 26.0–34.0)
MCHC: 27.8 g/dL — ABNORMAL LOW (ref 30.0–36.0)
MCV: 79.3 fL — ABNORMAL LOW (ref 80.0–100.0)
Monocytes Absolute: 1.6 10*3/uL — ABNORMAL HIGH (ref 0.1–1.0)
Monocytes Relative: 12 %
Neutro Abs: 8.8 10*3/uL — ABNORMAL HIGH (ref 1.7–7.7)
Neutrophils Relative %: 65 %
Platelets: 274 10*3/uL (ref 150–400)
RBC: 3.63 MIL/uL — ABNORMAL LOW (ref 4.22–5.81)
RDW: 17.4 % — ABNORMAL HIGH (ref 11.5–15.5)
WBC: 13.3 10*3/uL — ABNORMAL HIGH (ref 4.0–10.5)
nRBC: 0 % (ref 0.0–0.2)

## 2021-03-02 LAB — URINALYSIS, ROUTINE W REFLEX MICROSCOPIC
Glucose, UA: 100 mg/dL — AB
Nitrite: POSITIVE — AB
Protein, ur: >300 mg/dL — AB
RBC / HPF: >50 RBC/hpf — ABNORMAL HIGH (ref 0–5)
Specific Gravity, Urine: <1.005 — ABNORMAL LOW (ref 1.005–1.030)
WBC, UA: >50 WBC/hpf — ABNORMAL HIGH (ref 0–5)
pH: 8.5 — ABNORMAL HIGH (ref 5.0–8.0)

## 2021-03-02 LAB — RESP PANEL BY RT-PCR (FLU A&B, COVID) ARPGX2
Influenza A by PCR: NEGATIVE
Influenza B by PCR: NEGATIVE
SARS Coronavirus 2 by RT PCR: NEGATIVE

## 2021-03-02 LAB — LACTIC ACID, PLASMA
Lactic Acid, Venous: 1.8 mmol/L (ref 0.5–1.9)
Lactic Acid, Venous: 3.1 mmol/L (ref 0.5–1.9)

## 2021-03-02 MED ORDER — SODIUM CHLORIDE 0.9 % IR SOLN
3000.0000 mL | Status: DC
  Administered 2021-03-02 – 2021-03-04 (×5): 3000 mL

## 2021-03-02 MED ORDER — LACTATED RINGERS IV SOLN: INTRAVENOUS | Status: AC

## 2021-03-02 MED ORDER — LACTATED RINGERS IV BOLUS (SEPSIS)
1000.0000 mL | Freq: Once | INTRAVENOUS | Status: AC
  Administered 2021-03-02: 1000 mL via INTRAVENOUS

## 2021-03-02 MED ORDER — SODIUM CHLORIDE 0.9 % IV SOLN: 2.0000 g | Freq: Once | INTRAVENOUS | Status: DC

## 2021-03-02 MED ORDER — ONDANSETRON HCL 4 MG/2ML IJ SOLN
4.0000 mg | Freq: Four times a day (QID) | INTRAMUSCULAR | Status: DC | PRN
  Administered 2021-03-06: 4 mg via INTRAVENOUS

## 2021-03-02 MED ORDER — ONDANSETRON HCL 4 MG PO TABS
4.0000 mg | ORAL_TABLET | Freq: Four times a day (QID) | ORAL | Status: DC | PRN
  Filled 2021-03-02: qty 1

## 2021-03-02 MED ORDER — LACTATED RINGERS IV BOLUS (SEPSIS)
500.0000 mL | Freq: Once | INTRAVENOUS | Status: AC
  Administered 2021-03-02: 500 mL via INTRAVENOUS

## 2021-03-02 MED ORDER — LACTATED RINGERS IV SOLN: INTRAVENOUS | Status: DC

## 2021-03-02 MED ORDER — LIDOCAINE HCL URETHRAL/MUCOSAL 2 % EX GEL
1.0000 "application " | Freq: Once | CUTANEOUS | Status: AC
  Administered 2021-03-02: 1 via URETHRAL
  Filled 2021-03-02: qty 11

## 2021-03-02 MED ORDER — ACETAMINOPHEN 325 MG PO TABS
650.0000 mg | ORAL_TABLET | Freq: Four times a day (QID) | ORAL | Status: DC | PRN
  Administered 2021-03-05: 650 mg via ORAL
  Filled 2021-03-02 (×2): qty 2

## 2021-03-02 MED ORDER — SODIUM CHLORIDE 0.9 % IV SOLN
2.0000 g | Freq: Once | INTRAVENOUS | Status: AC
  Administered 2021-03-02: 2 g via INTRAVENOUS
  Filled 2021-03-02: qty 2

## 2021-03-02 MED ORDER — ACETAMINOPHEN 650 MG RE SUPP: 650.0000 mg | Freq: Four times a day (QID) | RECTAL | Status: DC | PRN

## 2021-03-02 MED ORDER — SODIUM CHLORIDE 0.9 % IV SOLN
2.0000 g | Freq: Three times a day (TID) | INTRAVENOUS | Status: AC
  Administered 2021-03-03 – 2021-03-09 (×19): 2 g via INTRAVENOUS
  Filled 2021-03-02 (×21): qty 2

## 2021-03-02 NOTE — H&P (View-Only) (Signed)
Urology Consult   Physician requesting consult: Dr Benjamine Mola  Reason for consult: Gross hematuria  History of Present Illness: Shannon [name] is a 75 y.o. patient is a 75 year old African-American male hospice patient with numerous medical issues and history of Chiari I malformation.  Patient has had prior history of gross hematuria from prostate trauma has a chronic indwelling Foley.  Presented with cute onset of gross hematuria again today from nursing facility.  Urine is dark red with clots and Foley will not irrigate.  We are asked to assist with the gross hematuria. 22 French three-way Foley catheter was placed without difficulty.  I irrigated a moderate amount of old clot from the bladder.  Patient then began experiencing significant bladder spasm around the Foley.  I hooked Foley up to CBI irrigation with normal saline and urine is light pink on moderate rate CBI.  Gentle traction on the Foley seems to help clear suggesting this is likely prostatic source.  Patient required cystoscopy under anesthesia in May 2022 by Dr. Arita Miss and found the source to be median lobe trauma from indwelling Foley as the bleeding source.  Bladder did not have any lesions at that time.    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 Newell Coral, 1992-->surg at [facility].  Summer 2021->Cervicalgia,arm pain, hand atrophy RUE wkness, hyperreflex (Dr. 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->to get surgery 03/2020   Hay fever    Hypertension    Osteoarthritis of both hands     Past Surgical History:  Procedure Laterality Date   ANTERIOR CERVICAL DECOMP/DISCECTOMY FUSION N/A 04/05/2020   Procedure: ANTERIOR CERVICAL DECOMPRESSION/DISCECTOMY FUSION, INTERBODY PROSTHESIS, PLATE/SCREWS CERVICAL THREE-CERVICAL FOUR, CERVICAL FOUR- CERVICAL FIVE;  Surgeon: Tressie Stalker, MD;  Location: [facility] OR;  Service: Neurosurgery;  Laterality: N/A;   ANTERIOR CERVICAL DECOMP/DISCECTOMY FUSION N/A 04/22/2020   Procedure: Reexploration of anterior cervical wound for epidural hematoma;  Surgeon: Donalee Citrin, MD;  Location: [facility] OR;  Service: Neurosurgery;  Laterality: N/A;   BACK SURGERY     CYSTOSCOPY N/A 11/13/2020   Procedure: CYSTOSCOPY, CLOT EVACUATION, FULGURATION;  Surgeon: Noel Christmas, MD;  Location: [facility] ORS;  Service: Urology;  Laterality: N/A;   INCISION AND DRAINAGE Left 2012   L hand infection   IR GASTROSTOMY TUBE MOD SED  07/05/2020   IR REPLC GASTRO/COLONIC TUBE PERCUT W/FLUORO  01/19/2021   ROTATOR CUFF REPAIR Right    x 3   SPINE SURGERY      Current Hospital Medications:  Home Meds:  No current facility-administered medications on file prior to encounter.   Current Outpatient Medications on File Prior to Encounter  Medication Sig Dispense Refill   albuterol (VENTOLIN HFA) 108 (90 Base) MCG/ACT inhaler Inhale 2 puffs into the lungs 2 (two) times daily.     AMINO ACIDS-PROTEIN HYDROLYS PO Take by mouth 2 (two) times daily between meals. 17-100 gram/ Kcal/30 mL   (Also known as Pro -Stat)     ascorbic acid (VITAMIN C) 500 MG tablet Take 1,000 mg by mouth daily.     aspirin 81 MG chewable tablet Chew 1 tablet (81 mg total) by mouth every morning.     Cranberry 400 MG CAPS Take 1 capsule by mouth daily at 6 (six) AM.     docusate (COLACE) 50 MG/5ML liquid Place 100 mg into feeding tube  2 (two) times daily.     DULoxetine (CYMBALTA) 30 MG capsule Take 30 mg by mouth daily.     Ferrous Sulfate 220 (44 Fe) MG/5ML LIQD Give 5 mLs by tube in the morning, at noon, and at bedtime. Via G tube     guaiFENesin (ROBITUSSIN) 100 MG/5ML liquid Take 100 mg by mouth 2 (two) times daily.     Lactobacillus (PROBIOTIC ACIDOPHILUS) CAPS Place 1 capsule into feeding tube daily.     midodrine (PROAMATINE) 10 MG tablet Place 1 tablet (10 mg total) into feeding tube 3  (three) times daily. 90 tablet 1   nutrition supplement, JUVEN, (JUVEN) PACK Place 1 packet into feeding tube 2 (two) times daily between meals.  0   Nutritional Supplements (FEEDING SUPPLEMENT, OSMOLITE 1.5 CAL,) LIQD Place 1,000 mLs into feeding tube continuous. 60 mL 0   Nutritional Supplements (FEEDING SUPPLEMENT, PROSOURCE TF,) liquid Place 45 mLs into feeding tube 2 (two) times daily.     nystatin (MYCOSTATIN/NYSTOP) powder Apply 1 application topically daily. Apply once daily during wound dressing change to sacrum     omeprazole (PRILOSEC) 20 MG capsule Take 20 mg by mouth every other day. Per Tube     oxyCODONE-acetaminophen (PERCOCET/ROXICET) 5-325 MG tablet Place 1 tablet into feeding tube 2 (two) times daily. 20 tablet 0   polyethylene glycol (MIRALAX / GLYCOLAX) 17 g packet Place 17 g into feeding tube daily.     Pregabalin 20 MG/ML SOLN Give 5 mLs by tube 2 (two) times daily.     scopolamine (TRANSDERM-SCOP) 1 MG/3DAYS Place 1 patch onto the skin every 3 (three) days.     tamsulosin (FLOMAX) 0.4 MG CAPS capsule Take 1 capsule (0.4 mg total) by mouth daily. 30 capsule 1   zinc gluconate 50 MG tablet Place 50 mg into feeding tube daily.       Scheduled Meds: Continuous Infusions:  ceFEPime (MAXIPIME) IV     [START ON 03/03/2021] ceFEPime (MAXIPIME) IV     lactated ringers     lactated ringers     sodium chloride irrigation     PRN Meds:.  Allergies:  Allergies  Allergen Reactions   Iodine Swelling    NOT CT CONTRAST   Penicillins Swelling    ** tolerates cephalosporins Facial swelling, itchy throat    Family History  Problem Relation Age of Onset   Heart attack Mother    Heart disease Mother    High blood pressure Mother    Alcohol abuse Father    Cancer Father    Diabetes Father    High blood pressure Father     Social History:  reports that he has quit smoking. He has never used smokeless tobacco. He reports that he does not currently use alcohol. He reports  that he does not use drugs.  ROS: A complete review of systems was performed.  All systems are negative except for pertinent findings as noted.  Physical Exam:  Vital signs in last 24 hours: Temp:  [98.6 F (37 C)] 98.6 F (37 C) (09/09 1309) Pulse Rate:  [113-115] 115 (09/09 1658) Resp:  [16-18] 16 (09/09 1658) BP: (96-117)/(61-69) 96/61 (09/09 1658) SpO2:  [95 %-100 %] 95 % (09/09 1658)  GU: Foley in place now draining light pink urine on CBI   Psychiatric: Normal mood and affect  Laboratory Data:  Recent Labs    03/02/21 1440  WBC 13.3*  HGB 8.0*  HCT 28.8*  PLT 274    Recent Labs  03/02/21 1440  NA 147*  K 4.3  CL 109  GLUCOSE 131*  BUN 27*  CALCIUM 9.5  CREATININE 0.45*     Results for orders placed or performed during the hospital encounter of 03/02/21 (from the past 24 hour(s))  Urinalysis, Routine w reflex microscopic Urine, Catheterized     Status: Abnormal   Collection Time: 03/02/21  2:30 PM  Result Value Ref Range   Color, Urine BROWN (A) YELLOW   APPearance TURBID (A) CLEAR   Specific Gravity, Urine <1.005 (L) 1.005 - 1.030   pH 8.5 (H) 5.0 - 8.0   Glucose, UA 100 (A) NEGATIVE mg/dL   Hgb urine dipstick LARGE (A) NEGATIVE   Bilirubin Urine LARGE (A) NEGATIVE   Ketones, ur TRACE (A) NEGATIVE mg/dL   Protein, ur >119>300 (A) NEGATIVE mg/dL   Nitrite POSITIVE (A) NEGATIVE   Leukocytes,Ua LARGE (A) NEGATIVE   RBC / HPF >50 (H) 0 - 5 RBC/hpf   WBC, UA >50 (H) 0 - 5 WBC/hpf   Bacteria, UA MANY (A) NONE SEEN   Triple Phosphate Crystal PRESENT   CBC with Differential     Status: Abnormal   Collection Time: 03/02/21  2:40 PM  Result Value Ref Range   WBC 13.3 (H) 4.0 - 10.5 K/uL   RBC 3.63 (L) 4.22 - 5.81 MIL/uL   Hemoglobin 8.0 (L) 13.0 - 17.0 g/dL   HCT 14.728.8 (L) 82.939.0 - 56.252.0 %   MCV 79.3 (L) 80.0 - 100.0 fL   MCH 22.0 (L) 26.0 - 34.0 pg   MCHC 27.8 (L) 30.0 - 36.0 g/dL   RDW 13.017.4 (H) 86.511.5 - 78.415.5 %   Platelets 274 150 - 400 K/uL   nRBC 0.0  0.0 - 0.2 %   Neutrophils Relative % 65 %   Neutro Abs 8.8 (H) 1.7 - 7.7 K/uL   Lymphocytes Relative 12 %   Lymphs Abs 1.6 0.7 - 4.0 K/uL   Monocytes Relative 12 %   Monocytes Absolute 1.6 (H) 0.1 - 1.0 K/uL   Eosinophils Relative 9 %   Eosinophils Absolute 1.2 (H) 0.0 - 0.5 K/uL   Basophils Relative 1 %   Basophils Absolute 0.1 0.0 - 0.1 K/uL   Immature Granulocytes 1 %   Abs Immature Granulocytes 0.08 (H) 0.00 - 0.07 K/uL  Basic metabolic panel     Status: Abnormal   Collection Time: 03/02/21  2:40 PM  Result Value Ref Range   Sodium 147 (H) 135 - 145 mmol/L   Potassium 4.3 3.5 - 5.1 mmol/L   Chloride 109 98 - 111 mmol/L   CO2 29 22 - 32 mmol/L   Glucose, Bld 131 (H) 70 - 99 mg/dL   BUN 27 (H) 8 - 23 mg/dL   Creatinine, Ser 6.960.45 (L) 0.61 - 1.24 mg/dL   Calcium 9.5 8.9 - 29.510.3 mg/dL   GFR, Estimated >28>60 >41>60 mL/min   Anion gap 9 5 - 15  Lactic acid, plasma     Status: None   Collection Time: 03/02/21  6:09 PM  Result Value Ref Range   Lactic Acid, Venous 1.8 0.5 - 1.9 mmol/L   No results found for this or any previous visit (from the past 240 hour(s)).  Renal Function: Recent Labs    03/02/21 1440  CREATININE 0.45*   CrCl cannot be calculated (Unknown ideal weight.).  Radiologic Imaging: No results found.  I independently reviewed the above imaging studies.  Impression/Recommendation Gross hematuria likely prostatic source based on history  and chronic indwelling Foley. Plan/recommendation: Foley now with three-way 22 [place] hematuria catheter with light to moderate CBI.  We will keep on light traction and monitor.  Use manual irrigation as needed for clots.  I am hopeful that larger catheter will hold pressure and manage the hematuria without need for operating room clot evacuation.  Belva Agee 03/02/2021, 8:07 PM      CC:

## 2021-03-02 NOTE — Consult Note (Signed)
Urology Consult   Physician requesting consult: Dr Benjamine Mola  Reason for consult: Gross hematuria  History of Present Illness: Shannon [name] is a 75 y.o. patient is a 75 year old African-American male hospice patient with numerous medical issues and history of Chiari I malformation.  Patient has had prior history of gross hematuria from prostate trauma has a chronic indwelling Foley.  Presented with cute onset of gross hematuria again today from nursing facility.  Urine is dark red with clots and Foley will not irrigate.  We are asked to assist with the gross hematuria. 22 French three-way Foley catheter was placed without difficulty.  I irrigated a moderate amount of old clot from the bladder.  Patient then began experiencing significant bladder spasm around the Foley.  I hooked Foley up to CBI irrigation with normal saline and urine is light pink on moderate rate CBI.  Gentle traction on the Foley seems to help clear suggesting this is likely prostatic source.  Patient required cystoscopy under anesthesia in May 2022 by Dr. Arita Miss and found the source to be median lobe trauma from indwelling Foley as the bleeding source.  Bladder did not have any lesions at that time.    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 Newell Coral, 1992-->surg at [facility].  Summer 2021->Cervicalgia,arm pain, hand atrophy RUE wkness, hyperreflex (Dr. 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->to get surgery 03/2020   Hay fever    Hypertension    Osteoarthritis of both hands     Past Surgical History:  Procedure Laterality Date   ANTERIOR CERVICAL DECOMP/DISCECTOMY FUSION N/A 04/05/2020   Procedure: ANTERIOR CERVICAL DECOMPRESSION/DISCECTOMY FUSION, INTERBODY PROSTHESIS, PLATE/SCREWS CERVICAL THREE-CERVICAL FOUR, CERVICAL FOUR- CERVICAL FIVE;  Surgeon: Tressie Stalker, MD;  Location: [facility] OR;  Service: Neurosurgery;  Laterality: N/A;   ANTERIOR CERVICAL DECOMP/DISCECTOMY FUSION N/A 04/22/2020   Procedure: Reexploration of anterior cervical wound for epidural hematoma;  Surgeon: Donalee Citrin, MD;  Location: [facility] OR;  Service: Neurosurgery;  Laterality: N/A;   BACK SURGERY     CYSTOSCOPY N/A 11/13/2020   Procedure: CYSTOSCOPY, CLOT EVACUATION, FULGURATION;  Surgeon: Noel Christmas, MD;  Location: [facility] ORS;  Service: Urology;  Laterality: N/A;   INCISION AND DRAINAGE Left 2012   L hand infection   IR GASTROSTOMY TUBE MOD SED  07/05/2020   IR REPLC GASTRO/COLONIC TUBE PERCUT W/FLUORO  01/19/2021   ROTATOR CUFF REPAIR Right    x 3   SPINE SURGERY      Current Hospital Medications:  Home Meds:  No current facility-administered medications on file prior to encounter.   Current Outpatient Medications on File Prior to Encounter  Medication Sig Dispense Refill   albuterol (VENTOLIN HFA) 108 (90 Base) MCG/ACT inhaler Inhale 2 puffs into the lungs 2 (two) times daily.     AMINO ACIDS-PROTEIN HYDROLYS PO Take by mouth 2 (two) times daily between meals. 17-100 gram/ Kcal/30 mL   (Also known as Pro -Stat)     ascorbic acid (VITAMIN C) 500 MG tablet Take 1,000 mg by mouth daily.     aspirin 81 MG chewable tablet Chew 1 tablet (81 mg total) by mouth every morning.     Cranberry 400 MG CAPS Take 1 capsule by mouth daily at 6 (six) AM.     docusate (COLACE) 50 MG/5ML liquid Place 100 mg into feeding tube  2 (two) times daily.     DULoxetine (CYMBALTA) 30 MG capsule Take 30 mg by mouth daily.     Ferrous Sulfate 220 (44 Fe) MG/5ML LIQD Give 5 mLs by tube in the morning, at noon, and at bedtime. Via G tube     guaiFENesin (ROBITUSSIN) 100 MG/5ML liquid Take 100 mg by mouth 2 (two) times daily.     Lactobacillus (PROBIOTIC ACIDOPHILUS) CAPS Place 1 capsule into feeding tube daily.     midodrine (PROAMATINE) 10 MG tablet Place 1 tablet (10 mg total) into feeding tube 3  (three) times daily. 90 tablet 1   nutrition supplement, JUVEN, (JUVEN) PACK Place 1 packet into feeding tube 2 (two) times daily between meals.  0   Nutritional Supplements (FEEDING SUPPLEMENT, OSMOLITE 1.5 CAL,) LIQD Place 1,000 mLs into feeding tube continuous. 60 mL 0   Nutritional Supplements (FEEDING SUPPLEMENT, PROSOURCE TF,) liquid Place 45 mLs into feeding tube 2 (two) times daily.     nystatin (MYCOSTATIN/NYSTOP) powder Apply 1 application topically daily. Apply once daily during wound dressing change to sacrum     omeprazole (PRILOSEC) 20 MG capsule Take 20 mg by mouth every other day. Per Tube     oxyCODONE-acetaminophen (PERCOCET/ROXICET) 5-325 MG tablet Place 1 tablet into feeding tube 2 (two) times daily. 20 tablet 0   polyethylene glycol (MIRALAX / GLYCOLAX) 17 g packet Place 17 g into feeding tube daily.     Pregabalin 20 MG/ML SOLN Give 5 mLs by tube 2 (two) times daily.     scopolamine (TRANSDERM-SCOP) 1 MG/3DAYS Place 1 patch onto the skin every 3 (three) days.     tamsulosin (FLOMAX) 0.4 MG CAPS capsule Take 1 capsule (0.4 mg total) by mouth daily. 30 capsule 1   zinc gluconate 50 MG tablet Place 50 mg into feeding tube daily.       Scheduled Meds: Continuous Infusions:  ceFEPime (MAXIPIME) IV     [START ON 03/03/2021] ceFEPime (MAXIPIME) IV     lactated ringers     lactated ringers     sodium chloride irrigation     PRN Meds:.  Allergies:  Allergies  Allergen Reactions   Iodine Swelling    NOT CT CONTRAST   Penicillins Swelling    ** tolerates cephalosporins Facial swelling, itchy throat    Family History  Problem Relation Age of Onset   Heart attack Mother    Heart disease Mother    High blood pressure Mother    Alcohol abuse Father    Cancer Father    Diabetes Father    High blood pressure Father     Social History:  reports that he has quit smoking. He has never used smokeless tobacco. He reports that he does not currently use alcohol. He reports  that he does not use drugs.  ROS: A complete review of systems was performed.  All systems are negative except for pertinent findings as noted.  Physical Exam:  Vital signs in last 24 hours: Temp:  [98.6 F (37 C)] 98.6 F (37 C) (09/09 1309) Pulse Rate:  [113-115] 115 (09/09 1658) Resp:  [16-18] 16 (09/09 1658) BP: (96-117)/(61-69) 96/61 (09/09 1658) SpO2:  [95 %-100 %] 95 % (09/09 1658)  GU: Foley in place now draining light pink urine on CBI   Psychiatric: Normal mood and affect  Laboratory Data:  Recent Labs    03/02/21 1440  WBC 13.3*  HGB 8.0*  HCT 28.8*  PLT 274    Recent Labs  03/02/21 1440  NA 147*  K 4.3  CL 109  GLUCOSE 131*  BUN 27*  CALCIUM 9.5  CREATININE 0.45*     Results for orders placed or performed during the hospital encounter of 03/02/21 (from the past 24 hour(s))  Urinalysis, Routine w reflex microscopic Urine, Catheterized     Status: Abnormal   Collection Time: 03/02/21  2:30 PM  Result Value Ref Range   Color, Urine BROWN (A) YELLOW   APPearance TURBID (A) CLEAR   Specific Gravity, Urine <1.005 (L) 1.005 - 1.030   pH 8.5 (H) 5.0 - 8.0   Glucose, UA 100 (A) NEGATIVE mg/dL   Hgb urine dipstick LARGE (A) NEGATIVE   Bilirubin Urine LARGE (A) NEGATIVE   Ketones, ur TRACE (A) NEGATIVE mg/dL   Protein, ur >300 (A) NEGATIVE mg/dL   Nitrite POSITIVE (A) NEGATIVE   Leukocytes,Ua LARGE (A) NEGATIVE   RBC / HPF >50 (H) 0 - 5 RBC/hpf   WBC, UA >50 (H) 0 - 5 WBC/hpf   Bacteria, UA MANY (A) NONE SEEN   Triple Phosphate Crystal PRESENT   CBC with Differential     Status: Abnormal   Collection Time: 03/02/21  2:40 PM  Result Value Ref Range   WBC 13.3 (H) 4.0 - 10.5 K/uL   RBC 3.63 (L) 4.22 - 5.81 MIL/uL   Hemoglobin 8.0 (L) 13.0 - 17.0 g/dL   HCT 28.8 (L) 39.0 - 52.0 %   MCV 79.3 (L) 80.0 - 100.0 fL   MCH 22.0 (L) 26.0 - 34.0 pg   MCHC 27.8 (L) 30.0 - 36.0 g/dL   RDW 17.4 (H) 11.5 - 15.5 %   Platelets 274 150 - 400 K/uL   nRBC 0.0  0.0 - 0.2 %   Neutrophils Relative % 65 %   Neutro Abs 8.8 (H) 1.7 - 7.7 K/uL   Lymphocytes Relative 12 %   Lymphs Abs 1.6 0.7 - 4.0 K/uL   Monocytes Relative 12 %   Monocytes Absolute 1.6 (H) 0.1 - 1.0 K/uL   Eosinophils Relative 9 %   Eosinophils Absolute 1.2 (H) 0.0 - 0.5 K/uL   Basophils Relative 1 %   Basophils Absolute 0.1 0.0 - 0.1 K/uL   Immature Granulocytes 1 %   Abs Immature Granulocytes 0.08 (H) 0.00 - 0.07 K/uL  Basic metabolic panel     Status: Abnormal   Collection Time: 03/02/21  2:40 PM  Result Value Ref Range   Sodium 147 (H) 135 - 145 mmol/L   Potassium 4.3 3.5 - 5.1 mmol/L   Chloride 109 98 - 111 mmol/L   CO2 29 22 - 32 mmol/L   Glucose, Bld 131 (H) 70 - 99 mg/dL   BUN 27 (H) 8 - 23 mg/dL   Creatinine, Ser 0.45 (L) 0.61 - 1.24 mg/dL   Calcium 9.5 8.9 - 10.3 mg/dL   GFR, Estimated >60 >60 mL/min   Anion gap 9 5 - 15  Lactic acid, plasma     Status: None   Collection Time: 03/02/21  6:09 PM  Result Value Ref Range   Lactic Acid, Venous 1.8 0.5 - 1.9 mmol/L   No results found for this or any previous visit (from the past 240 hour(s)).  Renal Function: Recent Labs    03/02/21 1440  CREATININE 0.45*   CrCl cannot be calculated (Unknown ideal weight.).  Radiologic Imaging: No results found.  I independently reviewed the above imaging studies.  Impression/Recommendation Gross hematuria likely prostatic source based on history   and chronic indwelling Foley. Plan/recommendation: Foley now with three-way 22 [place] hematuria catheter with light to moderate CBI.  We will keep on light traction and monitor.  Use manual irrigation as needed for clots.  I am hopeful that larger catheter will hold pressure and manage the hematuria without need for operating room clot evacuation.  Belva Agee 03/02/2021, 8:07 PM      CC:

## 2021-03-02 NOTE — ED Triage Notes (Signed)
Pt arrives via [facility] from [facility] for evaluation of bleeding from urethral meatus and hematuria. Per staff bleeding started last night which is somewhat usual for the patient, but it has worsened this morning. Transported to ED for evaluation.   EMS last VS - 126/70, HR 110, RR 18, 98% on RA, 97.9 temp.

## 2021-03-02 NOTE — ED Provider Notes (Signed)
Foley irrigated. F/u UA and CBC. Probable return to SNF Physical Exam  BP 117/66 (BP Location: Left Arm)   Pulse (!) 113   Temp 98.6 F (37 C) (Oral)   Resp 18   SpO2 100%   Physical Exam  ED Course/Procedures     Procedures  MDM    Patient with severe comorbid illness and disability on hospice.  Presents with blood in catheter.  Examined bladder with bedside ultrasound, appears consistent with large amount of clot in the bladder.  Patient also has SIRS criteria.  I reviewed patient's care with his daughter and hospice.  Daughter reports she wants full medical care.  Sepsis protocol initiated and urology consulted, admitted to medical service.  CRITICAL CARE Performed by: Arby Barrette   Total critical care time: 30 minutes  Critical care time was exclusive of separately billable procedures and treating other patients.  Critical care was necessary to treat or prevent imminent or life-threatening deterioration.  Critical care was time spent personally by me on the following activities: development of treatment plan with patient and/or surrogate as well as nursing, discussions with consultants, evaluation of patient's response to treatment, examination of patient, obtaining history from patient or surrogate, ordering and performing treatments and interventions, ordering and review of laboratory studies, ordering and review of radiographic studies, pulse oximetry and re-evaluation of patient's condition.      Arby Barrette, MD 03/12/21 [phone]

## 2021-03-02 NOTE — Progress Notes (Signed)
Civil engineer, contracting [facility]) Nurse Liaison Note  [name] is a current hospice patient with a terminal diagnosis of protein calorie nutrition who resides at [facility] as a long-term resident.    [facility] nurse liaisons will continue to follow for discharge planning needs and to coordinate continuation of hospice care. At time of discharge please use [facility] as [facility] contracts this service for our active hospice pts.  Thank you for the opportunity to participate in this patient's care.  Gillian Scarce, BSN, RN [facility] liaison [phone] (24h on call)

## 2021-03-02 NOTE — ED Notes (Signed)
Called patients daughter, [name], let her know patient is moving upstairs to 1433.

## 2021-03-02 NOTE — ED Notes (Signed)
Called Kennedy at lab to check on the status of blood cultures. Was told by Kyung Rudd that she notified the ED secretary at 1800 that the set of blood cultures sent down was incorrectly labelled. Previous RN did not receive this message. This RN was told at shift change that a set of blood cultures had been sent down. Josph Macho RN [facility] consulted for next steps.

## 2021-03-02 NOTE — ED Provider Notes (Signed)
[facility]-EMERGENCY DEPT Provider Note   CSN: [id number] Arrival date & time: 03/02/21  1254     History Chief Complaint  Patient presents with   Hematuria    Shannon [name] is a 75 y.o. male.  Pt is a 75 yo male presenting from [facility] for blood in foley cath. Pt is non verbal at this time.   The history is provided by the patient. No language interpreter was used.  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 Newell Coral, 1992-->surg at [facility].  Summer 2021->Cervicalgia,arm pain, hand atrophy RUE wkness, hyperreflex (Dr. 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   Hematuria 06/19/2020   Sacral decubitus ulcer 06/19/2020   COVID-19 virus infection 06/06/2020 06/19/2020   Palliative care by specialist    Goals of care, counseling/discussion    Protein-calorie malnutrition, severe 05/05/2020   Chronic indwelling Foley catheter 05/03/2020   Quadriplegia and quadriparesis (HCC)    Acute blood loss anemia    Slow transit constipation    Neuropathic pain    Asthma    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    Past Surgical History:  Procedure Laterality Date   ANTERIOR CERVICAL DECOMP/DISCECTOMY FUSION N/A 04/05/2020   Procedure: ANTERIOR CERVICAL DECOMPRESSION/DISCECTOMY FUSION, INTERBODY PROSTHESIS, PLATE/SCREWS CERVICAL  THREE-CERVICAL FOUR, CERVICAL FOUR- CERVICAL FIVE;  Surgeon: Tressie Stalker, MD;  Location: [facility] OR;  Service: Neurosurgery;  Laterality: N/A;   ANTERIOR CERVICAL DECOMP/DISCECTOMY FUSION N/A 04/22/2020   Procedure: Reexploration of anterior cervical wound for epidural hematoma;  Surgeon: Donalee Citrin, MD;  Location: [facility] OR;  Service: Neurosurgery;  Laterality: N/A;   BACK SURGERY     CYSTOSCOPY N/A 11/13/2020   Procedure: CYSTOSCOPY, CLOT EVACUATION, FULGURATION;  Surgeon: Noel Christmas, MD;  Location: [facility] ORS;  Service: Urology;  Laterality: N/A;   INCISION AND DRAINAGE Left 2012   L hand infection   IR GASTROSTOMY TUBE MOD SED  07/05/2020   IR REPLC GASTRO/COLONIC TUBE PERCUT W/FLUORO  01/19/2021   ROTATOR CUFF REPAIR Right    x 3   SPINE SURGERY         Family History  Problem Relation Age of Onset   Heart attack Mother    Heart disease Mother    High blood pressure Mother    Alcohol abuse Father    Cancer Father    Diabetes Father    High blood pressure Father     Social History   Tobacco Use   Smoking status: Former   Smokeless tobacco: Never  Building services engineer Use: Never used  Substance Use Topics   Alcohol use: Not Currently   Drug use: Never    Home Medications Prior to Admission medications   Medication Sig Start Date End Date Taking? Authorizing Provider  albuterol (VENTOLIN HFA) 108 (90 Base) MCG/ACT inhaler  Inhale 2 puffs into the lungs 2 (two) times daily.    [provider]  AMINO ACIDS-PROTEIN HYDROLYS PO Take by mouth 2 (two) times daily between meals. 17-100 gram/ Kcal/30 mL   (Also known as Pro -Stat)    [provider]  ascorbic acid (VITAMIN C) 500 MG tablet Take 1,000 mg by mouth daily.    [provider]  aspirin 81 MG chewable tablet Chew 1 tablet (81 mg total) by mouth every morning. 02/09/21   Joseph Art, DO  Cranberry 400 MG CAPS Take 1 capsule by mouth daily at 6 (six) AM.    [provider]  docusate  (COLACE) 50 MG/5ML liquid Place 100 mg into feeding tube 2 (two) times daily.    [provider]  DULoxetine (CYMBALTA) 30 MG capsule Take 30 mg by mouth daily.    [provider]  Ferrous Sulfate 220 (44 Fe) MG/5ML LIQD Give 5 mLs by tube in the morning, at noon, and at bedtime. Via G tube    [provider]  guaiFENesin (ROBITUSSIN) 100 MG/5ML liquid Take 100 mg by mouth 2 (two) times daily.    [provider]  Lactobacillus (PROBIOTIC ACIDOPHILUS) CAPS Place 1 capsule into feeding tube daily.    [provider]  midodrine (PROAMATINE) 10 MG tablet Place 1 tablet (10 mg total) into feeding tube 3 (three) times daily. 11/18/20   Rodolph Bong, MD  nutrition supplement, JUVEN, [employer]) PACK Place 1 packet into feeding tube 2 (two) times daily between meals. 11/18/20   Rodolph Bong, MD  Nutritional Supplements (FEEDING SUPPLEMENT, OSMOLITE 1.5 CAL,) LIQD Place 1,000 mLs into feeding tube continuous. 11/18/20   Rodolph Bong, MD  Nutritional Supplements (FEEDING SUPPLEMENT, PROSOURCE TF,) liquid Place 45 mLs into feeding tube 2 (two) times daily. 11/18/20   Rodolph Bong, MD  nystatin (MYCOSTATIN/NYSTOP) powder Apply 1 application topically daily. Apply once daily during wound dressing change to sacrum    [provider]  omeprazole (PRILOSEC) 20 MG capsule Take 20 mg by mouth every other day. Per Tube    [provider]  oxyCODONE-acetaminophen (PERCOCET/ROXICET) 5-325 MG tablet Place 1 tablet into feeding tube 2 (two) times daily. 12/21/20   Hughie Closs, MD  polyethylene glycol (MIRALAX / GLYCOLAX) 17 g packet Place 17 g into feeding tube daily.    [provider]  Pregabalin 20 MG/ML SOLN Give 5 mLs by tube 2 (two) times daily. 01/24/21   [provider]  scopolamine (TRANSDERM-SCOP) 1 MG/3DAYS Place 1 patch onto the skin every 3 (three) days.    [provider]  tamsulosin (FLOMAX) 0.4 MG CAPS  capsule Take 1 capsule (0.4 mg total) by mouth daily. 04/28/20   Tressie Stalker, MD  zinc gluconate 50 MG tablet Place 50 mg into feeding tube daily.    [provider]    Allergies    Iodine and Penicillins  Review of Systems   Review of Systems  Unable to perform ROS: Patient nonverbal  Genitourinary:  Positive for hematuria.   Physical Exam Updated Vital Signs BP 114/69 (BP Location: Left Arm)   Pulse (!) 113   Temp 98.6 F (37 C) (Oral)   Resp 18   SpO2 99%   Physical Exam Vitals and nursing note reviewed.  Constitutional:      General: He is sleeping.     Appearance: He is overweight.  HENT:     Nose: Nose normal.  Mouth/Throat:     Mouth: Mucous membranes are moist.     Pharynx: Oropharynx is clear.  Eyes:     Conjunctiva/sclera: Conjunctivae normal.     Pupils: Pupils are equal, round, and reactive to light.  Cardiovascular:     Rate and Rhythm: Normal rate and regular rhythm.  Pulmonary:     Effort: Pulmonary effort is normal.     Breath sounds: Normal breath sounds.  Abdominal:     General: Abdomen is flat.     Palpations: Abdomen is soft.     Tenderness: There is no abdominal tenderness.  Genitourinary:    Comments: Indwelling foley catheter present. Large amount of blood in catheter tubing and bag with clots. Musculoskeletal:     Comments: Bilateral upper extremity contractures  Skin:    General: Skin is warm and dry.     Capillary Refill: Capillary refill takes less than 2 seconds.     Comments: Pressure wounds bilateral heels Skin tear right lateral shin Skin tear right deltoid      ED Results / Procedures / Treatments   Labs (all labs ordered are listed, but only abnormal results are displayed) Labs Reviewed - No data to display  EKG None  Radiology No results found.  Procedures Procedures   Medications Ordered in ED Medications - No data to display  ED Course  I have reviewed the triage vital signs and the nursing  notes.  Pertinent labs & imaging results that were available during my care of the patient were reviewed by me and considered in my medical decision making (see chart for details).    MDM Rules/Calculators/A&P  1:58 PM Pt is a 75 yo male presenting from [facility] for blood in foley cath. Pt is non verbal at this time. Hematuria collected and sent for UA. Bladder scan ordered to r/o retention. Catheter flushed irrigated with normal saline. Labs pending.  Patient signed out to oncoming provider while awaiting results.        Final Clinical Impression(s) / ED Diagnoses Final diagnoses:  Hematuria, unspecified type    Rx / DC Orders ED Discharge Orders     None        Franne Forts, DO 03/03/21 [phone]

## 2021-03-02 NOTE — Plan of Care (Signed)
  Problem: Education: Goal: Knowledge of General Education information will improve Description Including pain rating scale, medication(s)/side effects and non-pharmacologic comfort measures Outcome: Progressing   Problem: Health Behavior/Discharge Planning: Goal: Ability to manage health-related needs will improve Outcome: Progressing   

## 2021-03-02 NOTE — ED Notes (Signed)
Urology MD Newsom at bedside changing pt to 80fr catheter and setting up continuous irrigation.

## 2021-03-02 NOTE — ED Notes (Signed)
Set of blood cultures drawn and sent to lab

## 2021-03-02 NOTE — Sepsis Progress Note (Addendum)
Messaged bedside RN via secure chat regarding the collection of blood cultures not showing in Epic.  RN called lab & was told specimen was sent before shift change but was not labeled correctly.  Lab stated they notified dept, but blood cultures not redrawn due to possible miscommunication.  RN drawing blood cultures now.  Antibiotics have already been given.

## 2021-03-02 NOTE — Sepsis Progress Note (Signed)
Monitoring for code sepsis protocol. 

## 2021-03-02 NOTE — Progress Notes (Signed)
A consult was received from an ED physician for aztreonam per pharmacy dosing unless PCN allergy in intolerance or history of cephalosporin use then cefepime may be used.    The patient's profile has been reviewed for ht/wt/allergies/indication/available labs.  Cefepime has been used previously in 2021.  A one time order has been placed for Cefepime 2 g IV.  Further antibiotics/pharmacy consults should be ordered by admitting physician if indicated.                       Thank you, Lynden Ang. PharmD, BCPS 03/02/2021  6:22 PM

## 2021-03-02 NOTE — Progress Notes (Signed)
Pharmacy Antibiotic Note  Shannon [name] is a 75 y.o. male admitted on 03/02/2021 with sepsis.  Patient is a current hospice patient with a terminal diagnosis of protein calorie nutrition who resides at [facility] as a long-term resident and presented with hematuria in foley cath.  Pharmacy has been consulted for cefepime dosing for UTI for 7 days.  Note: Initial consult was for azactam with comment for Pharmacist to investigate beta-lactam allergy.  If history of intolerance, mild allergy, or documented history of use of cephalosporins, pharmacy can adjust aztreonam to cefepime.   Cefepime was administered previously in 2021.  Cefepime 2 g IV x 1 ordered in ED - not yet administered  Plan: Continue cefepime 2 g IV every 8 hours Monitor clinical picture, renal function F/U C&S, abx deescalation       Temp (24hrs), Avg:98.6 F (37 C), Min:98.6 F (37 C), Max:98.6 F (37 C)  Recent Labs  Lab 03/02/21 1440 03/02/21 1809  WBC 13.3*  --   CREATININE 0.45*  --   LATICACIDVEN  --  1.8    CrCl cannot be calculated (Unknown ideal weight.).    Allergies  Allergen Reactions   Iodine Swelling    NOT CT CONTRAST   Penicillins Swelling    ** tolerates cephalosporins Facial swelling, itchy throat    Antimicrobials this admission: 9/9 cefepime >>   Dose adjustments this admission:   Microbiology results: 9/9 BCx: to be collected 9/9 Urinalysis: to be collected 8/12 UCx: <10,000 insignificant growth    Thank you for allowing pharmacy to be a part of this patient's care.  Selinda Eon, PharmD, BCPS Clinical Pharmacist [place] Please utilize Amion for appropriate phone number to reach the unit pharmacist [facility]) 03/02/2021 7:39 PM

## 2021-03-02 NOTE — ED Notes (Signed)
Pt's bladder scanned at 152 mL. Pt's foley bag was then changed using sterile technique with Myriam Jacobson B. EMT as second person assisting. Pt had foley flushed with 90 mL Sterile Water, passing large bloody clots thereafter. Pt re-bladder scanned afterwards with 75 mL noted.

## 2021-03-02 NOTE — H&P (Signed)
History and Physical   Shannon [name] MCN:[id number] DOB: [birth year] DOA: 03/02/2021  Referring MD/NP/PA: Dr. Clarice Pole  PCP: [name], MD   Outpatient Specialists: Urology   Patient coming from: [facility] health and rehab  Chief Complaint: Hematuria  HPI: Shannon [name] is a 75 y.o. male with medical history significant of quadriplegia, Chiari malformation type I, hypertension, osteoarthritis, stage IV sacral decubitus ulcer, chronic indwelling catheter, asthma and previous COVID-19 infection who was admitted to the ER from the skilled facility with significant hematuria.  Patient is DNR and supposed to be hospice but daughter wants treatment done.  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  He was noted to have dark red urine with clots.  His Foley will not irrigate.  Additionally has evidence of UTI with sepsis.  Patient is nonverbal at this point.  History obtained from nursing home records and daughter.  He was seen and evaluated.  Initiated on IV antibiotics.  Aggressive irrigation of his bladder with change in Foley catheter was performed by urology.  At this point patient is being admitted to the hospital for treatment of sepsis and management of gross hematuria.  Patient is DNI.  ED Course: Temperature 98.6 blood pressure 137/93, pulse 127, respirate of 22 and oxygen sats 95% on room air.  White count is 13.3 hemoglobin 8.0 and platelets 274.  Sodium is 147 potassium 4.3 chloride 10, CO2 of 29 glucose 131 BUN 27 creatinine of 0.45.  Lactic acid initially 1.8 and trended to 3.1.  Influenza and COVID-19 screen is negative.  Urinalysis showed turbid urine bacteria many WBC more than 50 but RBC more than 50 positive nitrite.  Patient therefore suspected to have sepsis due to UTI in addition to gross hematuria.  Review of Systems: As per HPI otherwise 10 point review of systems negative.    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 Newell Coral, 1992-->surg at [facility].  Summer 2021->Cervicalgia,arm pain, hand atrophy RUE wkness, hyperreflex (Dr. 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->to get surgery 03/2020   Hay fever    Hypertension    Osteoarthritis of both hands     Past Surgical History:  Procedure Laterality Date   ANTERIOR CERVICAL DECOMP/DISCECTOMY FUSION N/A 04/05/2020   Procedure: ANTERIOR CERVICAL DECOMPRESSION/DISCECTOMY FUSION, INTERBODY PROSTHESIS, PLATE/SCREWS CERVICAL THREE-CERVICAL FOUR, CERVICAL FOUR- CERVICAL FIVE;  Surgeon: Tressie Stalker, MD;  Location: [facility] OR;  Service: Neurosurgery;  Laterality: N/A;   ANTERIOR CERVICAL DECOMP/DISCECTOMY FUSION N/A 04/22/2020   Procedure: Reexploration of anterior cervical wound for epidural hematoma;  Surgeon: Donalee Citrin, MD;  Location: [facility] OR;  Service: Neurosurgery;  Laterality: N/A;   BACK SURGERY     CYSTOSCOPY N/A 11/13/2020   Procedure: CYSTOSCOPY, CLOT EVACUATION, FULGURATION;  Surgeon: Noel Christmas, MD;  Location: [facility] ORS;  Service: Urology;  Laterality: N/A;   INCISION AND DRAINAGE Left 2012   L hand infection   IR GASTROSTOMY TUBE MOD SED  07/05/2020   IR REPLC GASTRO/COLONIC TUBE PERCUT W/FLUORO  01/19/2021   ROTATOR CUFF REPAIR Right    x 3   SPINE SURGERY       reports that he has quit smoking. He has never used smokeless tobacco. He reports that he does not currently use alcohol. He reports that he does not use  drugs.  Allergies  Allergen Reactions   Iodine Swelling and Other (See Comments)    "NOT CT CONTRAST" "Allergic," per MAR   Penicillins Itching, Swelling and Other (See Comments)    "** tolerates cephalosporins; facial swelling, itchy throat" "Allergic," per [facility]    Family History  Problem Relation Age of Onset   Heart attack Mother    Heart disease Mother    High blood  pressure Mother    Alcohol abuse Father    Cancer Father    Diabetes Father    High blood pressure Father      Prior to Admission medications   Medication Sig Start Date End Date Taking? Authorizing Provider  Acidophilus Lactobacillus CAPS Place 1 capsule into feeding tube in the morning.   Yes [provider]  albuterol (VENTOLIN HFA) 108 (90 Base) MCG/ACT inhaler Inhale 2 puffs into the lungs 2 (two) times daily.   Yes [provider]  Amino Acids-Protein Hydrolys (PRO-STAT) LIQD Place 30 mLs into feeding tube daily.   Yes [provider]  ascorbic acid (VITAMIN C) 500 MG tablet Place 1,000 mg into feeding tube daily.   Yes [provider]  aspirin 81 MG chewable tablet Chew 1 tablet (81 mg total) by mouth every morning. Patient taking differently: Place 81 mg into feeding tube every morning. 02/09/21  Yes Vann, Jessica U, DO  Cranberry 400 MG CAPS Place 400 mg into feeding tube in the morning.   Yes [provider]  docusate (COLACE) 50 MG/5ML liquid Place 100 mg into feeding tube 2 (two) times daily.   Yes [provider]  DULoxetine (CYMBALTA) 30 MG capsule 30 mg See admin instructions. 30 ,g, per G-Tube, every morning   Yes [provider]  Ferrous Sulfate 220 (44 Fe) MG/5ML LIQD Give 220 mg by tube in the morning, at noon, and at bedtime. Via G tube   Yes [provider]  guaiFENesin (ROBITUSSIN) 100 MG/5ML liquid Place 100 mg into feeding tube 2 (two) times daily.   Yes [provider]  midodrine (PROAMATINE) 10 MG tablet Place 1 tablet (10 mg total) into feeding tube 3 (three) times daily. 11/18/20  Yes Rodolph Bong, MD  nutrition supplement, [employer], [employer]) PACK Place 1 packet into feeding tube 2 (two) times daily between meals. Patient taking differently: Place 1 packet into feeding tube See admin instructions. Mix the contents of 1 packet into 6 ounces of fluid and place into tube 2 times a day  between meals 11/18/20  Yes Rodolph Bong, MD  Nutritional Supplements (FEEDING SUPPLEMENT, OSMOLITE 1.5 CAL,) LIQD Place 1,000 mLs into feeding tube continuous. Patient taking differently: Place 75 mL/hr into feeding tube See admin instructions. 75 ml's/hr, via gastric tube, for 22 hours with a break of 2 hours for personal care 11/18/20  Yes Rodolph Bong, MD  nystatin (MYCOSTATIN/NYSTOP) powder Apply 1 application topically See admin instructions. Apply topically once a day to sacrum peri-wound and surrounding skin- during wound dressing change   Yes [provider]  omeprazole (PRILOSEC) 20 MG capsule 20 mg See admin instructions. 20 mg, via G-Tube, every other day   Yes [provider]  oxyCODONE-acetaminophen (PERCOCET/ROXICET) 5-325 MG tablet Place 1 tablet into feeding tube 2 (two) times daily. 12/21/20  Yes Pahwani, Daleen Bo, MD  polyethylene glycol (MIRALAX / GLYCOLAX) 17 g packet Place 17 g into feeding tube See admin instructions. Mix 17 grams of powder into 8 ounces of water and place into G-Tube  once a day   Yes [provider]  Pregabalin 20 MG/ML SOLN Give 100 mg by tube 2 (two) times daily. 01/24/21  Yes [provider]  scopolamine (TRANSDERM-SCOP) 1 MG/3DAYS Place 1 patch onto the skin every 3 (three) days.   Yes [provider]  tamsulosin (FLOMAX) 0.4 MG CAPS capsule Take 1 capsule (0.4 mg total) by mouth daily. Patient taking differently: 0.4 mg See admin instructions. 0.4 mg per G-Tube every morning 04/28/20  Yes Tressie Stalker, MD  zinc gluconate 50 MG tablet Place 50 mg into feeding tube daily.   Yes [provider]  Nutritional Supplements (FEEDING SUPPLEMENT, PROSOURCE TF,) liquid Place 45 mLs into feeding tube 2 (two) times daily. Patient not taking: Reported on 03/02/2021 11/18/20   Rodolph Bong, MD    Physical Exam: Vitals:   03/02/21 2000 03/02/21 2107 03/02/21 2148 03/02/21 2258  BP: 125/80  (!) 137/93  118/80  Pulse:   (!) 114 (!) 127  Resp: 20  18 20   Temp:    97.8 F (36.6 C)  TempSrc:    Oral  SpO2:   100% 98%  Weight:  79.6 kg        Constitutional: Chronically ill looking, frail, contractures, aphasic Vitals:   03/02/21 2000 03/02/21 2107 03/02/21 2148 03/02/21 2258  BP: 125/80  (!) 137/93 118/80  Pulse:   (!) 114 (!) 127  Resp: 20  18 20   Temp:    97.8 F (36.6 C)  TempSrc:    Oral  SpO2:   100% 98%  Weight:  79.6 kg     Eyes: PERRL, lids and conjunctivae normal ENMT: Mucous membranes are dry. Posterior pharynx clear of any exudate or lesions.Normal dentition.  Neck: normal, supple, no masses, no thyromegaly Respiratory: clear to auscultation bilaterally, no wheezing, no crackles. Normal respiratory effort. No accessory muscle use.  Cardiovascular: Sinus tachycardia, no murmurs / rubs / gallops. No extremity edema. 2+ pedal pulses. No carotid bruits.  Abdomen: no tenderness, no masses palpated. No hepatosplenomegaly. Bowel sounds positive.  Musculoskeletal: no clubbing / cyanosis.  Contractures of upper and lower extremity with marked muscle wasting  skin: no rashes, lesions, ulcers. No induration Neurologic: Patient not communicating, quadriparetic, significant contractures Psychiatric: Aphasic not communicating.     Labs on Admission: I have personally reviewed following labs and imaging studies  CBC: Recent Labs  Lab 03/02/21 1440  WBC 13.3*  NEUTROABS 8.8*  HGB 8.0*  HCT 28.8*  MCV 79.3*  PLT 274   Basic Metabolic Panel: Recent Labs  Lab 03/02/21 1440  NA 147*  K 4.3  CL 109  CO2 29  GLUCOSE 131*  BUN 27*  CREATININE 0.45*  CALCIUM 9.5   GFR: Estimated Creatinine Clearance: 86.3 mL/min (A) (by C-G formula based on SCr of 0.45 mg/dL (L)). Liver Function Tests: No results for input(s): AST, ALT, ALKPHOS, BILITOT, PROT, ALBUMIN in the last 168 hours. No results for input(s): LIPASE, AMYLASE in the last 168 hours. No results for input(s):  AMMONIA in the last 168 hours. Coagulation Profile: No results for input(s): INR, PROTIME in the last 168 hours. Cardiac Enzymes: No results for input(s): CKTOTAL, CKMB, CKMBINDEX, TROPONINI in the last 168 hours. BNP (last 3 results) No results for input(s): PROBNP in the last 8760 hours. HbA1C: No results for input(s): HGBA1C in the last 72 hours. CBG: No results for input(s): GLUCAP in the last 168 hours. Lipid Profile: No results for input(s): CHOL, HDL, LDLCALC, TRIG, CHOLHDL, LDLDIRECT in  the last 72 hours. Thyroid Function Tests: No results for input(s): TSH, T4TOTAL, FREET4, T3FREE, THYROIDAB in the last 72 hours. Anemia Panel: No results for input(s): VITAMINB12, FOLATE, FERRITIN, TIBC, IRON, RETICCTPCT in the last 72 hours. Urine analysis:    Component Value Date/Time   COLORURINE BROWN (A) 03/02/2021 1430   APPEARANCEUR TURBID (A) 03/02/2021 1430   LABSPEC <1.005 (L) 03/02/2021 1430   PHURINE 8.5 (H) 03/02/2021 1430   GLUCOSEU 100 (A) 03/02/2021 1430   HGBUR LARGE (A) 03/02/2021 1430   BILIRUBINUR LARGE (A) 03/02/2021 1430   KETONESUR TRACE (A) 03/02/2021 1430   PROTEINUR >300 (A) 03/02/2021 1430   NITRITE POSITIVE (A) 03/02/2021 1430   LEUKOCYTESUR LARGE (A) 03/02/2021 1430   Sepsis Labs: (procalcitonin:4,lacticidven:4) ) Recent Results (from the past 240 hour(s))  Resp Panel by RT-PCR (Flu A&B, Covid) Nasopharyngeal Swab     Status: None   Collection Time: 03/02/21  6:58 PM   Specimen: Nasopharyngeal Swab; Nasopharyngeal(NP) swabs in vial transport medium  Result Value Ref Range Status   SARS Coronavirus 2 by RT PCR NEGATIVE NEGATIVE Final    Comment: (NOTE) SARS-CoV-2 target nucleic acids are NOT DETECTED.  The SARS-CoV-2 RNA is generally detectable in upper respiratory specimens during the acute phase of infection. The lowest concentration of SARS-CoV-2 viral copies this assay can detect is 138 copies/mL. A negative result does not preclude  SARS-Cov-2 infection and should not be used as the sole basis for treatment or other patient management decisions. A negative result may occur with  improper specimen collection/handling, submission of specimen other than nasopharyngeal swab, presence of viral mutation(s) within the areas targeted by this assay, and inadequate number of viral copies(<138 copies/mL). A negative result must be combined with clinical observations, patient history, and epidemiological information. The expected result is Negative.  Fact Sheet for Patients:  BloggerCourse.com  Fact Sheet for Healthcare Providers:  SeriousBroker.it  This test is no t yet approved or cleared by the [place] FDA and  has been authorized for detection and/or diagnosis of SARS-CoV-2 by FDA under an Emergency Use Authorization (EUA). This EUA will remain  in effect (meaning this test can be used) for the duration of the COVID-19 declaration under Section 564(b)(1) of the Act, 21 U.S.C.section 360bbb-3(b)(1), unless the authorization is terminated  or revoked sooner.       Influenza A by PCR NEGATIVE NEGATIVE Final   Influenza B by PCR NEGATIVE NEGATIVE Final    Comment: (NOTE) The Xpert Xpress SARS-CoV-2/FLU/RSV plus assay is intended as an aid in the diagnosis of influenza from Nasopharyngeal swab specimens and should not be used as a sole basis for treatment. Nasal washings and aspirates are unacceptable for Xpert Xpress SARS-CoV-2/FLU/RSV testing.  Fact Sheet for Patients: BloggerCourse.com  Fact Sheet for Healthcare Providers: SeriousBroker.it  This test is not yet approved or cleared by the [place] FDA and has been authorized for detection and/or diagnosis of SARS-CoV-2 by FDA under an Emergency Use Authorization (EUA). This EUA will remain in effect (meaning this test can be used) for the duration of  the COVID-19 declaration under Section 564(b)(1) of the Act, 21 U.S.C. section 360bbb-3(b)(1), unless the authorization is terminated or revoked.  Performed at [facility], [address] [place], [place] [address]      Radiological Exams on Admission: No results found.    Assessment/Plan Principal Problem:   Sepsis due to gram-negative UTI [facility]) Active Problems:   Quadriplegia and quadriparesis (HCC)   Acute blood  loss anemia   Chronic indwelling Foley catheter   Sacral decubitus ulcer   Stage IV pressure ulcer of sacral region (HCC)   Severe sepsis with septic shock (HCC)     #1 sepsis due to UTI: Patient will be admitted.  He will be treated for healthcare associated UTI.  Coming from skilled facility with chronic indwelling catheter.  Aggressive IV antibiotics.  Tolerated cefepime but known to have severe penicillin allergies of.  May use aztreonam.  Urine and blood cultures obtained and will follow.  #2 gross hematuria: Appreciate urology help.  Foley catheter has been changed.  Irrigation ongoing.  #3 acute blood loss anemia: Hemoglobin currently at 8.0.  Continue to monitor and transfuse if less than 7 g.  #4 stage IV sacral decubitus ulcer: Continue wound care  #5 quadriparesis and quadriplegia: Secondary to Chiari I malformation.  Chronic.  Continue monitoring   DVT prophylaxis: SCD Code Status: DNI Family Communication: Daughter Disposition Plan: Back to skilled facility Consults called: Urology Dr. Benancio DeedsNewsome Admission status: Inpatient  Severity of Illness: The appropriate patient status for this patient is INPATIENT. Inpatient status is judged to be reasonable and necessary in order to provide the required intensity of service to ensure the patient's safety. The patient's presenting symptoms, physical exam findings, and initial radiographic and laboratory data in the context of their chronic comorbidities is felt to place them at high risk  for further clinical deterioration. Furthermore, it is not anticipated that the patient will be medically stable for discharge from the hospital within 2 midnights of admission. The following factors support the patient status of inpatient.   " The patient's presenting symptoms include gross hematuria. " The worrisome physical exam findings include evidence of hematuria and aphasia. " The initial radiographic and laboratory data are worrisome because of hemoglobin of 8.  Also evidence of UTI " The chronic co-morbidities include quadriparesis.   * I certify that at the point of admission it is my clinical judgment that the patient will require inpatient hospital care spanning beyond 2 midnights from the point of admission due to high intensity of service, high risk for further deterioration and high frequency of surveillance required.Lonia Blood*   Delancey Moraes,LAWAL MD [facility] Pager [phone]  If 7PM-7AM, please contact night-coverage [email or web address] Password [email or web address]  03/02/2021, 11:24 PM

## 2021-03-02 NOTE — Sepsis Progress Note (Signed)
Notified provider of need to order repeat lactic acid. ° °

## 2021-03-03 DIAGNOSIS — L89154 Pressure ulcer of sacral region, stage 4: Secondary | ICD-10-CM

## 2021-03-03 DIAGNOSIS — D62 Acute posthemorrhagic anemia: Secondary | ICD-10-CM

## 2021-03-03 DIAGNOSIS — N390 Urinary tract infection, site not specified: Secondary | ICD-10-CM

## 2021-03-03 DIAGNOSIS — A419 Sepsis, unspecified organism: Secondary | ICD-10-CM

## 2021-03-03 DIAGNOSIS — G8250 Quadriplegia, unspecified: Secondary | ICD-10-CM

## 2021-03-03 DIAGNOSIS — Z978 Presence of other specified devices: Secondary | ICD-10-CM

## 2021-03-03 LAB — CBC
HCT: 24.8 % — ABNORMAL LOW (ref 39.0–52.0)
Hemoglobin: 7 g/dL — ABNORMAL LOW (ref 13.0–17.0)
MCH: 22.2 pg — ABNORMAL LOW (ref 26.0–34.0)
MCHC: 28.2 g/dL — ABNORMAL LOW (ref 30.0–36.0)
MCV: 78.5 fL — ABNORMAL LOW (ref 80.0–100.0)
Platelets: 268 10*3/uL (ref 150–400)
RBC: 3.16 MIL/uL — ABNORMAL LOW (ref 4.22–5.81)
RDW: 17.2 % — ABNORMAL HIGH (ref 11.5–15.5)
WBC: 11.8 10*3/uL — ABNORMAL HIGH (ref 4.0–10.5)
nRBC: 0 % (ref 0.0–0.2)

## 2021-03-03 LAB — COMPREHENSIVE METABOLIC PANEL
ALT: 18 U/L (ref 0–44)
AST: 27 U/L (ref 15–41)
Albumin: 1.9 g/dL — ABNORMAL LOW (ref 3.5–5.0)
Alkaline Phosphatase: 85 U/L (ref 38–126)
Anion gap: 7 (ref 5–15)
BUN: 23 mg/dL (ref 8–23)
CO2: 29 mmol/L (ref 22–32)
Calcium: 8.9 mg/dL (ref 8.9–10.3)
Chloride: 105 mmol/L (ref 98–111)
Creatinine, Ser: 0.56 mg/dL — ABNORMAL LOW (ref 0.61–1.24)
GFR, Estimated: >60 mL/min (ref >60)
Glucose, Bld: 114 mg/dL — ABNORMAL HIGH (ref 70–99)
Potassium: 3.9 mmol/L (ref 3.5–5.1)
Sodium: 141 mmol/L (ref 135–145)
Total Bilirubin: 0.9 mg/dL (ref 0.3–1.2)
Total Protein: 8.9 g/dL — ABNORMAL HIGH (ref 6.5–8.1)

## 2021-03-03 LAB — CORTISOL-AM, BLOOD: Cortisol - AM: 15.4 ug/dL (ref 6.7–22.6)

## 2021-03-03 LAB — PROTIME-INR
INR: 1.5 — ABNORMAL HIGH (ref 0.8–1.2)
Prothrombin Time: 18.5 seconds — ABNORMAL HIGH (ref 11.4–15.2)

## 2021-03-03 LAB — PROCALCITONIN: Procalcitonin: <0.1 ng/mL

## 2021-03-03 LAB — LACTIC ACID, PLASMA: Lactic Acid, Venous: 2 mmol/L (ref 0.5–1.9)

## 2021-03-03 MED ORDER — PRO-STAT PO LIQD: 30.0000 mL | Freq: Every day | ORAL | Status: DC

## 2021-03-03 MED ORDER — CRANBERRY 400 MG PO CAPS: 400.0000 mg | ORAL_CAPSULE | Freq: Every morning | ORAL | Status: DC

## 2021-03-03 MED ORDER — DOCUSATE SODIUM 50 MG/5ML PO LIQD
100.0000 mg | Freq: Two times a day (BID) | ORAL | Status: DC
  Administered 2021-03-05 – 2021-03-10 (×10): 100 mg
  Filled 2021-03-03 (×15): qty 10

## 2021-03-03 MED ORDER — OSMOLITE 1.5 CAL PO LIQD: 75.0000 mL/h | ORAL | Status: DC

## 2021-03-03 MED ORDER — JUVEN PO PACK
1.0000 | PACK | Freq: Two times a day (BID) | ORAL | Status: DC
  Administered 2021-03-04 – 2021-03-05 (×3): 1
  Filled 2021-03-03 (×4): qty 1

## 2021-03-03 MED ORDER — OSMOLITE 1.5 CAL PO LIQD
1000.0000 mL | ORAL | Status: DC
  Administered 2021-03-04: 1000 mL
  Filled 2021-03-03 (×3): qty 1000

## 2021-03-03 MED ORDER — ALBUTEROL SULFATE (2.5 MG/3ML) 0.083% IN NEBU
2.5000 mg | INHALATION_SOLUTION | Freq: Two times a day (BID) | RESPIRATORY_TRACT | Status: DC
  Administered 2021-03-03 – 2021-03-09 (×11): 2.5 mg via RESPIRATORY_TRACT
  Filled 2021-03-03 (×13): qty 3

## 2021-03-03 MED ORDER — PREGABALIN 20 MG/ML PO SOLN: 100.0000 mg | Freq: Two times a day (BID) | ORAL | Status: DC

## 2021-03-03 MED ORDER — SCOPOLAMINE 1 MG/3DAYS TD PT72
1.0000 | MEDICATED_PATCH | TRANSDERMAL | Status: DC
  Administered 2021-03-03 – 2021-03-06 (×2): 1.5 mg via TRANSDERMAL
  Filled 2021-03-03 (×3): qty 1

## 2021-03-03 MED ORDER — DULOXETINE HCL 30 MG PO CPEP
30.0000 mg | ORAL_CAPSULE | Freq: Every day | ORAL | Status: DC
  Administered 2021-03-04 – 2021-03-10 (×6): 30 mg via ORAL
  Filled 2021-03-03 (×8): qty 1

## 2021-03-03 MED ORDER — GUAIFENESIN 100 MG/5ML PO SOLN
100.0000 mg | Freq: Two times a day (BID) | ORAL | Status: DC
  Administered 2021-03-03 – 2021-03-10 (×13): 100 mg
  Filled 2021-03-03 (×13): qty 10

## 2021-03-03 MED ORDER — POLYETHYLENE GLYCOL 3350 17 G PO PACK: 17.0000 g | PACK | Freq: Every day | ORAL | Status: DC

## 2021-03-03 MED ORDER — FERROUS SULFATE 300 (60 FE) MG/5ML PO SYRP
300.0000 mg | ORAL_SOLUTION | Freq: Every day | ORAL | Status: DC
  Administered 2021-03-04 – 2021-03-10 (×7): 300 mg
  Filled 2021-03-03 (×7): qty 5

## 2021-03-03 MED ORDER — CHLORHEXIDINE GLUCONATE CLOTH 2 % EX PADS: 6.0000 | MEDICATED_PAD | Freq: Every day | CUTANEOUS | Status: DC

## 2021-03-03 MED ORDER — ACIDOPHILUS LACTOBACILLUS PO CAPS: 1.0000 | ORAL_CAPSULE | Freq: Every morning | ORAL | Status: DC

## 2021-03-03 MED ORDER — PROSOURCE TF PO LIQD
45.0000 mL | Freq: Every day | ORAL | Status: DC
  Administered 2021-03-04: 45 mL
  Filled 2021-03-03: qty 45

## 2021-03-03 MED ORDER — OMEPRAZOLE 2 MG/ML ORAL SUSPENSION: 20.0000 mg | Freq: Every day | ORAL | Status: DC

## 2021-03-03 MED ORDER — CHLORHEXIDINE GLUCONATE CLOTH 2 % EX PADS
6.0000 | MEDICATED_PAD | Freq: Every day | CUTANEOUS | Status: DC
  Administered 2021-03-04 – 2021-03-10 (×6): 6 via TOPICAL

## 2021-03-03 MED ORDER — OXYCODONE-ACETAMINOPHEN 5-325 MG PO TABS
1.0000 | ORAL_TABLET | Freq: Two times a day (BID) | ORAL | Status: DC
Start: 2021-03-03 — End: 2021-03-05
  Administered 2021-03-03 – 2021-03-05 (×5): 1
  Filled 2021-03-03 (×6): qty 1

## 2021-03-03 MED ORDER — TAMSULOSIN HCL 0.4 MG PO CAPS
0.4000 mg | ORAL_CAPSULE | Freq: Every day | ORAL | Status: DC
  Administered 2021-03-03 – 2021-03-08 (×5): 0.4 mg via ORAL
  Filled 2021-03-03 (×6): qty 1

## 2021-03-03 MED ORDER — PREGABALIN 50 MG PO CAPS
100.0000 mg | ORAL_CAPSULE | Freq: Two times a day (BID) | ORAL | Status: DC
  Administered 2021-03-03 – 2021-03-06 (×5): 100 mg via ORAL
  Filled 2021-03-03 (×7): qty 2

## 2021-03-03 MED ORDER — ASCORBIC ACID 500 MG PO TABS
1000.0000 mg | ORAL_TABLET | Freq: Every day | ORAL | Status: DC
  Administered 2021-03-04 – 2021-03-10 (×7): 1000 mg
  Filled 2021-03-03 (×8): qty 2

## 2021-03-03 MED ORDER — PANTOPRAZOLE SODIUM 40 MG PO PACK
40.0000 mg | PACK | Freq: Every day | ORAL | Status: DC
  Administered 2021-03-03 – 2021-03-07 (×5): 40 mg
  Filled 2021-03-03 (×5): qty 20

## 2021-03-03 MED ORDER — MIDODRINE HCL 5 MG PO TABS
10.0000 mg | ORAL_TABLET | Freq: Three times a day (TID) | ORAL | Status: DC
  Administered 2021-03-03 – 2021-03-10 (×18): 10 mg
  Filled 2021-03-03 (×19): qty 2

## 2021-03-03 MED ORDER — FLORANEX PO PACK
1.0000 g | PACK | Freq: Every day | ORAL | Status: DC
  Administered 2021-03-04 – 2021-03-09 (×6): 1 g
  Filled 2021-03-03 (×7): qty 1

## 2021-03-03 NOTE — Progress Notes (Signed)
Pt had large loose green/brown stool. Miralax on hold will continue to monitor the need ongoing as necessary and update MD later if pt becomes constipated. SRP, RN

## 2021-03-03 NOTE — Sepsis Progress Note (Signed)
Notified bedside nurse of need to draw repeat lactic acid. 

## 2021-03-03 NOTE — Progress Notes (Signed)
PROGRESS NOTE    Shannon [name]  PZW:[id number] DOB: May 10, 1946 DOA: 03/02/2021 PCP: Shannon Massed, MD    Brief Narrative:  Shannon [name] was admitted to the hospital with the working diagnosis of sepsis due to urinary tract infection, complicated with hematuria.  75 year old male past medical history for quadriplegia, Chiari malformation type I, hypertension, osteoarthritis, chronic indwelling Foley catheter, asthma and stage IV sacral decubitus ulcer who was transferred from the skilled nursing facility to the hospital due to significant hematuria.  Patient has been under hospice.  Apparently he had dark red urine with clots, unable to irrigate Foley catheter.  On his initial physical examination his temperature was 98.6, blood pressure 137/93, heart rate 127, respiratory 22, oxygen saturation 95% he had dry mucous membranes, his lungs were clear to auscultation bilaterally, heart S1-S2, present, tachycardic, abdomen was soft, no lower extremity edema.  Patient had contractures in his upper and lower extremities, quadriplegia, noncommunicative.  Aphasic.  Sodium 147, potassium 4.3, chloride 109, bicarb 29, glucose 131, BUN 27, creatinine 0.45, white count 13.3, hemoglobin 8.0, hematocrit 28.8, platelets 274. SARS COVID-19 negative.  Urinalysis specific gravity <1.005,, > 300 protein,, > 50 white cells, > 50 red cells.  Patient was placed on intravenous antibiotic therapy, neurology was consulted and patient was placed on continuous bladder irrigation.  Assessment & Plan:   Principal Problem:   Sepsis secondary to UTI [facility]) Active Problems:   Quadriplegia and quadriparesis (HCC)   Acute blood loss anemia   Chronic indwelling Foley catheter   Stage IV pressure ulcer of sacral region (HCC)  Sepsis due to complicated urinary tract infection, in the setting of chronic foley cathter (present on admission). Patient with no abdominal pain, he is on continuous bladder irrigation. Wbc is  11,8  Continue antibiotic therapy with cefepime (patient had pseudomonas in the past urine culture from 05/22  Resume midodrine, pregabalin, scopolamine and oxycodone.  Tamsulosin,   2. Acute blood loss anemia due to hematuria/ iron deficiency. Urine is clearing, from macroscopic blood. Continue with bladder irrigation per urology recommendations.   Hgb this am is down to 7,0 with hct at 24.8. if continue to drop below 7 will transfuse PRBC. Follow cell count in am.   Resume iron supplementation.   3. Quadriplegia, swallow dysfunction.  Continue neuro checks per unit protocol, plan to resume tube feedings. Consult nutrition Continue with aspiration precautions.     Patient continue to be at high risk for worsening sepsis and acute blood loss anemia   Status is: Inpatient  Remains inpatient appropriate because:Inpatient level of care appropriate due to severity of illness  Dispo: The patient is from: SNF              Anticipated d/c is to: SNF              Patient currently is not medically stable to d/c.   Difficult to place patient No   DVT prophylaxis: Scd   Code Status:  full  Family Communication:   I spoke with patient's nice at the bedside, we talked in detail about patient's condition, plan of care and prognosis and all questions were addressed.     Skin Documentation:  Consultants:  Urology   Antimicrobials:  Ceftriaxone     Subjective: Patient with no nausea or vomiting, no chest pain or dyspnea, he has been placed on continuous bladder irrigation   Objective: Vitals:   03/02/21 2300 03/03/21 0147 03/03/21 0318 03/03/21 0702  BP:  103/62 104/65  98/65  Pulse:  (!) 117 (!) 116 (!) 116  Resp:  18  18  Temp:  97.8 F (36.6 C)  98.7 F (37.1 C)  TempSrc:  Oral  Oral  SpO2:  99% 100% 100%  Weight: 80 kg     Height: 5\' 11"  (1.803 m)       Intake/Output Summary (Last 24 hours) at 03/03/2021 1417 Last data filed at 03/03/2021 1308 Gross per 24 hour   Intake 05/03/2021 ml  Output 13585 ml  Net -1935 ml   Filed Weights   03/02/21 2107 03/02/21 2300  Weight: 79.6 kg 80 kg    Examination:   General: Not in pain or dyspnea,  Neurology: Awake and alert, quadriplegia  E ENT: positive pallor, no icterus, oral mucosa moist Cardiovascular: No JVD. S1-S2 present, rhythmic, no gallops, rubs, or murmurs. No lower extremity edema. Pulmonary: positive breath sounds bilaterally, adequate air movement, no wheezing, rhonchi or rales. Gastrointestinal. Abdomen soft and non tender Skin. Stage IV pressure decubitus ulcer.  Musculoskeletal: no joint deformities     Data Reviewed: I have personally reviewed following labs and imaging studies  CBC: Recent Labs  Lab 03/02/21 1440 03/03/21 0038  WBC 13.3* 11.8*  NEUTROABS 8.8*  --   HGB 8.0* 7.0*  HCT 28.8* 24.8*  MCV 79.3* 78.5*  PLT 274 268   Basic Metabolic Panel: Recent Labs  Lab 03/02/21 1440 03/03/21 0038  NA 147* 141  K 4.3 3.9  CL 109 105  CO2 29 29  GLUCOSE 131* 114*  BUN 27* 23  CREATININE 0.45* 0.56*  CALCIUM 9.5 8.9   GFR: Estimated Creatinine Clearance: 86.3 mL/min (A) (by C-G formula based on SCr of 0.56 mg/dL (L)). Liver Function Tests: Recent Labs  Lab 03/03/21 0038  AST 27  ALT 18  ALKPHOS 85  BILITOT 0.9  PROT 8.9*  ALBUMIN 1.9*   No results for input(s): LIPASE, AMYLASE in the last 168 hours. No results for input(s): AMMONIA in the last 168 hours. Coagulation Profile: Recent Labs  Lab 03/03/21 0038  INR 1.5*   Cardiac Enzymes: No results for input(s): CKTOTAL, CKMB, CKMBINDEX, TROPONINI in the last 168 hours. BNP (last 3 results) No results for input(s): PROBNP in the last 8760 hours. HbA1C: No results for input(s): HGBA1C in the last 72 hours. CBG: No results for input(s): GLUCAP in the last 168 hours. Lipid Profile: No results for input(s): CHOL, HDL, LDLCALC, TRIG, CHOLHDL, LDLDIRECT in the last 72 hours. Thyroid Function Tests: No  results for input(s): TSH, T4TOTAL, FREET4, T3FREE, THYROIDAB in the last 72 hours. Anemia Panel: No results for input(s): VITAMINB12, FOLATE, FERRITIN, TIBC, IRON, RETICCTPCT in the last 72 hours.    Radiology Studies: I have reviewed all of the imaging during this hospital visit personally     Scheduled Meds:  Chlorhexidine Gluconate Cloth  6 each Topical Daily   Continuous Infusions:  ceFEPime (MAXIPIME) IV 2 g (03/03/21 1307)   lactated ringers 125 mL/hr at 03/02/21 2325   sodium chloride irrigation       LOS: 1 day        Doctor Sheahan 05/02/21, MD

## 2021-03-03 NOTE — Plan of Care (Signed)

## 2021-03-03 NOTE — Progress Notes (Signed)
  Subjective: Patient resting comfortably today.  Urine remains light pink on moderate rate CBI.  No obvious clots.  Tends to clear when increasing CBI rate.  Objective: Vital signs in last 24 hours: Temp:  [97.8 F (36.6 C)-98.7 F (37.1 C)] 98.7 F (37.1 C) (09/10 0702) Pulse Rate:  [113-127] 116 (09/10 0702) Resp:  [16-22] 18 (09/10 0702) BP: (96-137)/(61-93) 98/65 (09/10 0702) SpO2:  [95 %-100 %] 100 % (09/10 0702) Weight:  [79.6 kg-80 kg] 80 kg (09/09 2300)  Intake/Output from previous day: 09/09 0701 - 09/10 0700 In: 8590  Out: 9335 [Urine:9335] Intake/Output this shift: Total I/O In: -  Out: 250 [Urine:250]  Physical Exam:  General: Alert and oriented C  Lab Results: Recent Labs    03/02/21 1440 03/03/21 0038  HGB 8.0* 7.0*  HCT 28.8* 24.8*   BMET Recent Labs    03/02/21 1440 03/03/21 0038  NA 147* 141  K 4.3 3.9  CL 109 105  CO2 29 29  GLUCOSE 131* 114*  BUN 27* 23  CREATININE 0.45* 0.56*  CALCIUM 9.5 8.9     Studies/Results: No results found.  Assessment/Plan: 1.  Gross hematuria likely secondary to prostate source now with CBI slightly improved. Plan/recommendation.  Continue CBI will wean as tolerated.  Hopeful this will continue to improve with conservative management and not require cystoscopy fulguration.  Follow hemoglobin hematocrit transfuse as needed.  Blood count continues to drop requiring transfusion will consider cystoscopy fulguration    LOS: 1 day   Shannon [name] 03/03/2021, 8:44 AM

## 2021-03-03 NOTE — Progress Notes (Signed)
Pt refused to be turned and to start tube feedings. He states, "To just leave him alone" will attempt to try again in a couple of hours.

## 2021-03-03 NOTE — Progress Notes (Addendum)
Shannon [name] [address] [facility]) hospitalized hospice patient visit  [name] is a current hospice patient with a terminal diagnosis of severe protein calorie malnutrition who resides at [facility]. Facility sent patient to the ED for bleeding from meatus, Hospice was notified only after patient was transferred. [name] was admitted to [facility] on 9.9.2022 with a diagnosis of Sepsis related to UTI. Per Dr. Barbee Shropshire with [facility] this is a related hospital admission.   Visited with patient at bedside and spoke with his daughter Shannon [name] who is a bedside. Patient reports to feeling ok today, he is alert and conversive. He reports he never really felt bad yesterday although he understands he was somewhat confused. Patient currently has bladder irrigation running. Daughter Shannon [name] reports that she is currently waiting on the doctor to come in.  Patient is inpatient appropriate due to need for bladder irrigation and IVAB Vital Signs: T- 98.7, HR- 116, BP- 98/65, RR- 18, spO2- 100 room air I&O: 11,650/14,385 (bladder irrigation) Abnormal Labs: Creatinine 0.56, Albumin 1.9, total protein 8.9, lactic acid 2 Diagnostics: None IV/PRN Meds: LR @125 /H, Cefepime 2grams every 8 hours Problem List:  #1 sepsis due to UTI: Patient will be admitted.  He will be treated for healthcare associated UTI.  Coming from skilled facility with chronic indwelling catheter.  Aggressive IV antibiotics.  Tolerated cefepime but known to have severe penicillin allergies of.  May use aztreonam.  Urine and blood cultures obtained and will follow.   #2 gross hematuria: Appreciate urology help.  Foley catheter has been changed.  Irrigation ongoing.   #3 acute blood loss anemia: Hemoglobin currently at 8.0.  Continue to monitor and transfuse if less than 7 g.   #4 stage IV sacral decubitus ulcer: Continue wound care   #5 quadriparesis and quadriplegia: Secondary to Chiari I malformation.  Chronic.   Continue monitoring Discharge Planning: Ongoing Family Contact: talked with daughter at bedside IDT: Updated Goals of Care: per daughter to treat what is treatable and then for him to return to facility.  Medication list and transfer summary given to patient's bedside nurse.  Should patient need ambulance transport at discharge please use [facility] as they contract this service for our active hospice patients.  , Thea Gist, BSN, [facility] Liaison  [phone]

## 2021-03-04 LAB — BASIC METABOLIC PANEL WITH GFR
Anion gap: 6 (ref 5–15)
BUN: 23 mg/dL (ref 8–23)
CO2: 28 mmol/L (ref 22–32)
Calcium: 8.4 mg/dL — ABNORMAL LOW (ref 8.9–10.3)
Chloride: 106 mmol/L (ref 98–111)
Creatinine, Ser: 0.54 mg/dL — ABNORMAL LOW (ref 0.61–1.24)
GFR, Estimated: >60 mL/min
Glucose, Bld: 108 mg/dL — ABNORMAL HIGH (ref 70–99)
Potassium: 3.4 mmol/L — ABNORMAL LOW (ref 3.5–5.1)
Sodium: 140 mmol/L (ref 135–145)

## 2021-03-04 LAB — CBC
HCT: 20 % — ABNORMAL LOW (ref 39.0–52.0)
Hemoglobin: 5.8 g/dL — CL (ref 13.0–17.0)
MCH: 22.6 pg — ABNORMAL LOW (ref 26.0–34.0)
MCHC: 29 g/dL — ABNORMAL LOW (ref 30.0–36.0)
MCV: 77.8 fL — ABNORMAL LOW (ref 80.0–100.0)
Platelets: 220 K/uL (ref 150–400)
RBC: 2.57 MIL/uL — ABNORMAL LOW (ref 4.22–5.81)
RDW: 17.4 % — ABNORMAL HIGH (ref 11.5–15.5)
WBC: 9.2 K/uL (ref 4.0–10.5)
nRBC: 0 % (ref 0.0–0.2)

## 2021-03-04 LAB — PREPARE RBC (CROSSMATCH)

## 2021-03-04 MED ORDER — PROSOURCE TF PO LIQD
90.0000 mL | Freq: Two times a day (BID) | ORAL | Status: DC
  Administered 2021-03-04 – 2021-03-05 (×2): 90 mL
  Filled 2021-03-04 (×2): qty 90

## 2021-03-04 MED ORDER — ZINC SULFATE 220 (50 ZN) MG PO CAPS
220.0000 mg | ORAL_CAPSULE | Freq: Every day | ORAL | Status: DC
  Administered 2021-03-04 – 2021-03-10 (×7): 220 mg
  Filled 2021-03-04 (×8): qty 1

## 2021-03-04 MED ORDER — FREE WATER
100.0000 mL | Status: DC
  Administered 2021-03-04 – 2021-03-10 (×35): 100 mL

## 2021-03-04 MED ORDER — POTASSIUM CHLORIDE 20 MEQ PO PACK
40.0000 meq | PACK | Freq: Once | ORAL | Status: AC
  Administered 2021-03-04: 40 meq
  Filled 2021-03-04: qty 2

## 2021-03-04 MED ORDER — OSMOLITE 1.5 CAL PO LIQD
1000.0000 mL | ORAL | Status: DC
  Administered 2021-03-04: 1000 mL
  Filled 2021-03-04 (×2): qty 1000

## 2021-03-04 MED ORDER — SODIUM CHLORIDE 0.9% IV SOLUTION: Freq: Once | INTRAVENOUS | Status: DC

## 2021-03-04 NOTE — Progress Notes (Signed)
Blood consent obtained from patient verbally with two RN witnessing and placed on chart.

## 2021-03-04 NOTE — Progress Notes (Signed)
PROGRESS NOTE    Shannon [name]  DGU:[id number] DOB: 10/24/1945 DOA: 03/02/2021 PCP: [name], MD    Brief Narrative:  [name] was admitted to the hospital with the working diagnosis of sepsis due to urinary tract infection, complicated with hematuria.   75 year old male past medical history for quadriplegia, Chiari malformation type I, hypertension, osteoarthritis, chronic indwelling Foley catheter, asthma and stage IV sacral decubitus ulcer who was transferred from the skilled nursing facility to the hospital due to significant hematuria.  Patient has been under hospice.  Apparently he had dark red urine with clots, unable to irrigate Foley catheter.  On his initial physical examination his temperature was 98.6, blood pressure 137/93, heart rate 127, respiratory 22, oxygen saturation 95% he had dry mucous membranes, his lungs were clear to auscultation bilaterally, heart S1-S2, present, tachycardic, abdomen was soft, no lower extremity edema.  Patient had contractures in his upper and lower extremities, quadriplegia, noncommunicative.  Aphasic.   Sodium 147, potassium 4.3, chloride 109, bicarb 29, glucose 131, BUN 27, creatinine 0.45, white count 13.3, hemoglobin 8.0, hematocrit 28.8, platelets 274. SARS COVID-19 negative.   Urinalysis specific gravity <1.005,, > 300 protein,, > 50 white cells, > 50 red cells.   Patient was placed on intravenous antibiotic therapy, neurology was consulted and patient was placed on continuous bladder irrigation.   Worsening anemia with Hgb down to 5,8 and hct 20,0 on 03/04/21.  Ordered 2 units PRBC.   Assessment & Plan:   Principal Problem:   Sepsis secondary to UTI [facility]) Active Problems:   Quadriplegia and quadriparesis (HCC)   Acute blood loss anemia   Chronic indwelling Foley catheter   Stage IV pressure ulcer of sacral region (HCC)   Sepsis due to complicated urinary tract infection, in the setting of chronic foley cathter (present  on admission). On continuous bladder irrigation. Wbc today is 9,2. Blood pressure systolic in the high 90 to low 100 mmHg.  Antibiotic therapy with cefepime (patient had pseudomonas in the past urine culture from 05/22)   Continue with midodrine, pregabalin, scopolamine and oxycodone.  On Tamsulosin,   Continue bladder irrigation per urology recommendations.    2. Acute blood loss anemia due to hematuria/ prostate source / iron deficiency. Continue to have bloody urine, on continuous bladder irrigation   His Hgb has dropped to 5.8 with htc at 20,0 and Plt at 220.  Plan for PRBC transfusion x2 units.   Continue with iron supplementation. Continue with Flomax.  Follow cell count in am.    3. Quadriplegia, swallow dysfunction. Stage 3 vertebral column pressure ulcer. Present on admission.  Patient is awake and alert. No confusion or agitation, responds to questions appropriately Feedings per peg tube have been resumed.  Aspiration precautions.  Continue local skin pressure ulcer care.   4. Hypokalemia. Stable renal function with serum cr at 0,54, with K at 3,4 and serum bicarbonate at 28, Will add 40 meq Kcl and will follow up renal function in am.    Patient continue to be at high risk for worsening anemia, acute blood loss.   Status is: Inpatient  Remains inpatient appropriate because:Inpatient level of care appropriate due to severity of illness  Dispo: The patient is from: Home              Anticipated d/c is to: Home              Patient currently is not medically stable to d/c.   Difficult to place patient  No   DVT prophylaxis: Scd   Code Status:    DNR  Family Communication:  No family at the bedside      Nutrition Status:           Skin Documentation: Pressure Injury 03/02/21 Vertebral column Lower;Medial Stage 3 -  Full thickness tissue loss. Subcutaneous fat may be visible but bone, tendon or muscle are NOT exposed. (Active)  03/02/21 2305  Location:  Vertebral column  Location Orientation: Lower;Medial  Staging: Stage 3 -  Full thickness tissue loss. Subcutaneous fat may be visible but bone, tendon or muscle are NOT exposed.  Wound Description (Comments):   Present on Admission: Yes     Pressure Injury 06/22/20 Sacrum Posterior Stage 4 - Full thickness tissue loss with exposed bone, tendon or muscle. PHYSICAL THERAPY/HYDROTHERAPY (Active)  06/22/20 1400  Location: Sacrum  Location Orientation: Posterior  Staging: Stage 4 - Full thickness tissue loss with exposed bone, tendon or muscle.  Wound Description (Comments): PHYSICAL THERAPY/HYDROTHERAPY  Present on Admission: Yes     Pressure Injury 12/14/20 Head Lower;Posterior Deep Tissue Pressure Injury - Purple or maroon localized area of discolored intact skin or blood-filled blister due to damage of underlying soft tissue from pressure and/or shear. Maroon discoloration, bleedi (Active)  12/14/20 0600  Location: Head  Location Orientation: Lower;Posterior  Staging: Deep Tissue Pressure Injury - Purple or maroon localized area of discolored intact skin or blood-filled blister due to damage of underlying soft tissue from pressure and/or shear.  Wound Description (Comments): Maroon discoloration, bleeding, nonblanchable  Present on Admission:      Pressure Injury 02/02/21 Heel Left Deep Tissue Pressure Injury - Purple or maroon localized area of discolored intact skin or blood-filled blister due to damage of underlying soft tissue from pressure and/or shear. (Active)  02/02/21 2244  Location: Heel  Location Orientation: Left  Staging: Deep Tissue Pressure Injury - Purple or maroon localized area of discolored intact skin or blood-filled blister due to damage of underlying soft tissue from pressure and/or shear.  Wound Description (Comments):   Present on Admission:      Pressure Injury 03/02/21 Toe (Comment  which one) Anterior;Left Deep Tissue Pressure Injury - Purple or maroon  localized area of discolored intact skin or blood-filled blister due to damage of underlying soft tissue from pressure and/or shear. (Active)  03/02/21 2308  Location: Toe (Comment  which one)  Location Orientation: Anterior;Left  Staging: Deep Tissue Pressure Injury - Purple or maroon localized area of discolored intact skin or blood-filled blister due to damage of underlying soft tissue from pressure and/or shear.  Wound Description (Comments):   Present on Admission: Yes     Consultants:  Urology   Procedures:  Continuous bladder irrigation   Antimicrobials:  Ceftriaxone     Subjective: Patient with no nausea or vomiting, continue to have hematuria, no abdominal pain,  Objective: Vitals:   03/04/21 0032 03/04/21 0830 03/04/21 1131 03/04/21 1205  BP: 97/67  (!) 89/54 99/61  Pulse: (!) 101  98 96  Resp: 17  18 16   Temp: 98.6 F (37 C)  97.8 F (36.6 C) 97.7 F (36.5 C)  TempSrc: Oral  Oral Axillary  SpO2: 98% 92% 98% 98%  Weight:      Height:        Intake/Output Summary (Last 24 hours) at 03/04/2021 1338 Last data filed at 03/04/2021 1255 Gross per 24 hour  Intake 14329.98 ml  Output 05/04/2021 ml  Net -1645.02 ml   75102  Weights   03/02/21 2107 03/02/21 2300  Weight: 79.6 kg 80 kg    Examination:   General: Not in pain or dyspnea  Neurology: Awake and alert, non focal  E ENT: mild pallor, no icterus, oral mucosa moist Cardiovascular: No JVD. S1-S2 present, rhythmic, no gallops, rubs, or murmurs. No lower extremity edema. Pulmonary: positive breath sounds bilaterally, with no wheezing, rhonchi or rales. Gastrointestinal. Abdomen soft and non tender Skin. No rashes Musculoskeletal: no joint deformities     Data Reviewed: I have personally reviewed following labs and imaging studies  CBC: Recent Labs  Lab 03/02/21 1440 03/03/21 0038 03/04/21 0407  WBC 13.3* 11.8* 9.2  NEUTROABS 8.8*  --   --   HGB 8.0* 7.0* 5.8*  HCT 28.8* 24.8* 20.0*  MCV 79.3*  78.5* 77.8*  PLT 274 268 220   Basic Metabolic Panel: Recent Labs  Lab 03/02/21 1440 03/03/21 0038 03/04/21 0407  NA 147* 141 140  K 4.3 3.9 3.4*  CL 109 105 106  CO2 29 29 28   GLUCOSE 131* 114* 108*  BUN 27* 23 23  CREATININE 0.45* 0.56* 0.54*  CALCIUM 9.5 8.9 8.4*   GFR: Estimated Creatinine Clearance: 86.3 mL/min (A) (by C-G formula based on SCr of 0.54 mg/dL (L)). Liver Function Tests: Recent Labs  Lab 03/03/21 0038  AST 27  ALT 18  ALKPHOS 85  BILITOT 0.9  PROT 8.9*  ALBUMIN 1.9*   No results for input(s): LIPASE, AMYLASE in the last 168 hours. No results for input(s): AMMONIA in the last 168 hours. Coagulation Profile: Recent Labs  Lab 03/03/21 0038  INR 1.5*   Cardiac Enzymes: No results for input(s): CKTOTAL, CKMB, CKMBINDEX, TROPONINI in the last 168 hours. BNP (last 3 results) No results for input(s): PROBNP in the last 8760 hours. HbA1C: No results for input(s): HGBA1C in the last 72 hours. CBG: No results for input(s): GLUCAP in the last 168 hours. Lipid Profile: No results for input(s): CHOL, HDL, LDLCALC, TRIG, CHOLHDL, LDLDIRECT in the last 72 hours. Thyroid Function Tests: No results for input(s): TSH, T4TOTAL, FREET4, T3FREE, THYROIDAB in the last 72 hours. Anemia Panel: No results for input(s): VITAMINB12, FOLATE, FERRITIN, TIBC, IRON, RETICCTPCT in the last 72 hours.    Radiology Studies: I have reviewed all of the imaging during this hospital visit personally     Scheduled Meds:  sodium chloride   Intravenous Once   albuterol  2.5 mg Inhalation BID   ascorbic acid  1,000 mg Per Tube Daily   Chlorhexidine Gluconate Cloth  6 each Topical Daily   docusate  100 mg Per Tube BID   DULoxetine  30 mg Oral Daily   feeding supplement (PROSource TF)  45 mL Per Tube Daily   ferrous sulfate  300 mg Per Tube Q1200   guaiFENesin  100 mg Per Tube BID   lactobacillus  1 g Per Tube Daily   midodrine  10 mg Per Tube TID PC   nutrition  supplement (JUVEN)  1 packet Per Tube BID BM   oxyCODONE-acetaminophen  1 tablet Per Tube BID   pantoprazole sodium  40 mg Per Tube Daily   pregabalin  100 mg Oral BID   scopolamine  1 patch Transdermal Q72H   tamsulosin  0.4 mg Oral q1800   Continuous Infusions:  ceFEPime (MAXIPIME) IV 2 g (03/04/21 0610)   feeding supplement (OSMOLITE 1.5 CAL) 1,000 mL (03/04/21 1101)   lactated ringers 75 mL/hr at 03/04/21 0607   sodium chloride irrigation  LOS: 2 days        Maudean Hoffmann Gerome Apley, MD

## 2021-03-04 NOTE — Consult Note (Signed)
WOC Nurse Consult Note: Reason for Consult:Patient known to me from previous admissions. Last seen on 02/08/21. Left heel with DTPI (blood blister) and sacral Stage 4 wound is stable.Left head wound is a Stage 3. Patient's Braden Scale Score today is a 10; he is at high risk for continued skin breakdown.. Wound type: Pressure Pressure Injury POA: Yes Measurement: Left heel is 2cm x 2cm intact blood filled blister. Sacral and left head wounds are to be measured by WTA S. Haynes Dage today and documented on the Nursing Flow Sheet. Wound bed: Sacrum is pink, moist and with evidence of previous wound healing (contraction). Small to moderate serous drainage. Drainage (amount, consistency, odor) As noted above Periwound:intact Dressing procedure/placement/frequency:A mattress replacement is provided as are bilateral pressure redistribution heel boots. The topical care of the sacral wound is with a silver hydrofiber (Aquacel Ag+ Advantage) and the head and left foot will be covered with xeroform gauze prior to covering with a silicone foam. The cornerstone of the POC is turning and repositioning and it is noted that on occasion, patient refuses this intervention. Patient is to return to [facility] and the outpatient services of Hospice upon discharge.  WOC nursing team will not follow, but will remain available to this patient, the nursing and medical teams.  Please re-consult if needed. Thanks, Ladona Mow, MSN, RN, GNP, [place]  Pager# [phone]

## 2021-03-04 NOTE — Progress Notes (Signed)
Initial Nutrition Assessment RD working remotely.  DOCUMENTATION CODES:   Not applicable  INTERVENTION:  - will adjust TF regimen: Osmolite 1.5 @ 55 ml/hr with 90 ml Prosource TF BID, 1 packet Juven BID, and 100 ml free water every 4 hours. - this regimen will provide 2330 kcal, 132 grams protein, and 1606 ml free water.  - will order 220 mg zinc sulfate/day. - complete NFPE when feasible.   NUTRITION DIAGNOSIS:   Increased nutrient needs related to acute illness, wound healing as evidenced by estimated needs.  GOAL:   Patient will meet greater than or equal to 90% of their needs  MONITOR:   TF tolerance, Labs, Weight trends, Skin  REASON FOR ASSESSMENT:   Consult Enteral/tube feeding initiation and management  ASSESSMENT:   75 y.o. male with medical history of quadriplegia, Chiari malformation type I, HTN, osteoarthritis, stage IV sacral decubitus ulcer, chronic indwelling catheter, asthma, and previous COVID-19 infection. He presented to the ED from SNF d/t significant hematuria. Patient is non-verbal.  Patient noted to now be a/o x4; was non-verbal in the ED.   Regular diet ordered on 9/9 at 2300 and he ate 0% of lunch yesterday and 0% of breakfast today.   PEG in place from PTA and order in place for Osmolite 1.5 @ 75 ml/hr with 45 ml Prosource TF once/day and 1 packet of Juven BID. This regimen is providing 2930 kcal, 129 grams protein, and 1372 ml free water.   This RD assessed patient frequently from 11/14/20-12/19/20. As of 6/28 he was receiving Osmolite 1.5 @ 60 ml/hr with 90 ml Prosource TF BID, Juven BID, and 200 ml free water TID.  This regimen was providing 2510 kcal, 139 grams protein, and 1697 ml free water.   Estimated needs during that hospitalization were higher than current d/t medical course at that time.   Weight on 9/9 was 176 lb and weight on 6/30 was 186 lb. This indicates 10 lb weight loss (5.4% body weight) in the past 2.5 months; not significant  for time frame. Noted to have non-pitting edema to BUE and to be -2.37 L since admission.    Labs reviewed; K: 3.4 mmol/l, creatinine: 0.54 mg/dl, Ca: 8.4 mg/dl. Medications reviewed; 1000 mg ascorbic acid/day, 100 mg colace BID, 300 mg ferrous sulfate/day, 40 mg protonix per PEG/day, 40 mEq Klor-Con/day,  IVF; LR @ 75 ml/hr.     NUTRITION - FOCUSED PHYSICAL EXAM:  Unable to complete at this time.  Diet Order:   Diet Order             Diet regular Room service appropriate? Yes; Fluid consistency: Thin  Diet effective now                   EDUCATION NEEDS:   No education needs have been identified at this time  Skin:  Skin Assessment: Skin Integrity Issues: Skin Integrity Issues:: DTI, Stage III DTI: L heel; toe on L foot Stage III: medical vertebral column  Last BM:  9/10 (type 5 x1 and type 6 x1)  Height:   Ht Readings from Last 1 Encounters:  03/02/21 5\' 11"  (1.803 m)    Weight:   Wt Readings from Last 1 Encounters:  03/02/21 80 kg     Estimated Nutritional Needs:  Kcal:  2100-2300 kcal Protein:  115-130 grams Fluid:  >/= 2.3 L/day      05/02/21, MS, RD, LDN, CNSC Inpatient Clinical Dietitian RD pager # available in [facility]  After  hours/weekend pager # available in [facility]

## 2021-03-04 NOTE — Progress Notes (Signed)
Audrea Muscat, NP notified of pt's critical Hgb of 5.8 and pt's refusal of tube feedings and turning.  2 units PRBC ordered for transfusion after type and screen completed and blood consent obtained.

## 2021-03-04 NOTE — Progress Notes (Signed)
  Subjective: Patient resting comfortably this morning.  Urine remains light pink on minimal to moderate rate CBI drip.  Looks much improved over yesterday.  Continues to be anemic perhaps due to hematuria.  Likely will need transfusion.  Do not feel patient needs cystoscopy and clot evacuation or fulguration at this point as urine is clearing with Foley catheter.  Objective: Vital signs in last 24 hours: Temp:  [98.6 F (37 C)-98.8 F (37.1 C)] 98.6 F (37 C) (09/11 0032) Pulse Rate:  [101-118] 101 (09/11 0032) Resp:  [17-20] 17 (09/11 0032) BP: (97-99)/(67-68) 97/67 (09/11 0032) SpO2:  [92 %-99 %] 92 % (09/11 0830)  Intake/Output from previous day: 09/10 0701 - 09/11 0700 In: 24268 [P.O.:120; I.V.:4910; IV Piggyback:300] Out: 34196 [Urine:16850] Intake/Output this shift: No intake/output data recorded.  Physical Exam:  General: Alert and oriented   Lab Results: Recent Labs    03/02/21 1440 03/03/21 0038 03/04/21 0407  HGB 8.0* 7.0* 5.8*  HCT 28.8* 24.8* 20.0*   BMET Recent Labs    03/03/21 0038 03/04/21 0407  NA 141 140  K 3.9 3.4*  CL 105 106  CO2 29 28  GLUCOSE 114* 108*  BUN 23 23  CREATININE 0.56* 0.54*  CALCIUM 8.9 8.4*     Studies/Results: No results found.  Assessment/Plan: 1.  Gross hematuria likely prostatic source, improving Plan recommendation: Continue CBI wean as possible.  If continues to to require blood products may consider cystoscopy and fulguration but seems to be responding to conservative measures at this point.    LOS: 2 days   Belva Agee 03/04/2021, 8:32 AM

## 2021-03-04 NOTE — Progress Notes (Signed)
Shannon [name] [address] [facility]) hospitalized hospice patient visit  [name] is a current hospice patient with a terminal diagnosis of severe protein calorie malnutrition who resides at [facility]. Facility sent patient to the ED for bleeding from meatus, Hospice was notified only after patient was transferred. [name] was admitted to [facility] on 9.9.2022 with a diagnosis of Sepsis related to UTI. Per Dr. Barbee Shropshire with [facility] this is a related hospital admission.   Visited with patient at bedside and spoke by phone with his daughter [name]. Patient opens eyes to name but then goes back to sleep. Tube feeds in progress; blood transfusing.  No acute distress noted, respirations even/unlabored.  Report exchanged with bedside RN.    Patient is inpatient appropriate due to need for bladder irrigation, IVAB, and blood transfusions.  Vital Signs: T- 97.5, HR- 118, BP- 95/64, RR- 17, spO2- 100%  room air I&O: 18,330/16850 (bladder irrigation) Abnormal Labs: K 3.4, Ca 8.4, albumin 1.9, RBC 2.57, Hgb 5.8, HCT 20.0 Diagnostics: None new IV/PRN Meds: LR @75mL /H, Cefepime 2grams every 8 hours, 2 units PRBC once Problem List:  #Sepsis due to complicated urinary tract infection, in the setting of chronic foley cathter (present on admission). On continuous bladder irrigation. Wbc today is 9,2. Blood pressure systolic in the high 90 to low 100 mmHg.  Antibiotic therapy with cefepime (patient had pseudomonas in the past urine culture from 05/22)   Continue with midodrine, pregabalin, scopolamine and oxycodone.  On Tamsulosin,    Continue bladder irrigation per urology recommendations.     2. Acute blood loss anemia due to hematuria/ prostate source / iron deficiency. Continue to have bloody urine, on continuous bladder irrigation   His Hgb has dropped to 5.8 with htc at 20,0 and Plt at 220.   Plan for PRBC transfusion x2 units.    Continue with iron supplementation.  Continue with Flomax.  Follow cell count in am.     3. Quadriplegia, swallow dysfunction. Stage 3 vertebral column pressure ulcer. Present on admission.  Patient is awake and alert. No confusion or agitation, responds to questions appropriately Feedings per peg tube have been resumed.  Aspiration precautions.  Continue local skin pressure ulcer care.    4. Hypokalemia. Stable renal function with serum cr at 0,54, with K at 3,4 and serum bicarbonate at 28, Will add 40 meq Kcl and will follow up renal function in am.    Discharge Planning: back to [facility] with hospice services when stable to discharge Family Contact: talked with daughter  by phone IDT: Updated Goals of Care: per daughter to treat what is treatable and then for him to return to facility.    Should patient need ambulance transport at discharge please use [facility] as they contract this service for our active hospice patients.   Thank you for the opportunity to participate in this pt's care.  [facility], BSN, RN [facility] Liaison  [phone]

## 2021-03-04 NOTE — Progress Notes (Addendum)
Pt moved to air mattress and dressings were changed. Pt stated discomfort during movement. Shannon [name]

## 2021-03-05 LAB — BLOOD CULTURE ID PANEL (REFLEXED) - BCID2

## 2021-03-05 LAB — BASIC METABOLIC PANEL
Anion gap: 7 (ref 5–15)
BUN: 31 mg/dL — ABNORMAL HIGH (ref 8–23)
CO2: 27 mmol/L (ref 22–32)
Calcium: 8.4 mg/dL — ABNORMAL LOW (ref 8.9–10.3)
Chloride: 108 mmol/L (ref 98–111)
Creatinine, Ser: 0.46 mg/dL — ABNORMAL LOW (ref 0.61–1.24)
GFR, Estimated: >60 mL/min (ref >60)
Glucose, Bld: 124 mg/dL — ABNORMAL HIGH (ref 70–99)
Potassium: 3.5 mmol/L (ref 3.5–5.1)
Sodium: 142 mmol/L (ref 135–145)

## 2021-03-05 LAB — CBC
HCT: 24 % — ABNORMAL LOW (ref 39.0–52.0)
Hemoglobin: 7.3 g/dL — ABNORMAL LOW (ref 13.0–17.0)
MCH: 24.5 pg — ABNORMAL LOW (ref 26.0–34.0)
MCHC: 30.4 g/dL (ref 30.0–36.0)
MCV: 80.5 fL (ref 80.0–100.0)
Platelets: 213 10*3/uL (ref 150–400)
RBC: 2.98 MIL/uL — ABNORMAL LOW (ref 4.22–5.81)
RDW: 17 % — ABNORMAL HIGH (ref 11.5–15.5)
WBC: 10.1 10*3/uL (ref 4.0–10.5)
nRBC: 0.3 % — ABNORMAL HIGH (ref 0.0–0.2)

## 2021-03-05 LAB — PREPARE RBC (CROSSMATCH)

## 2021-03-05 MED ORDER — OXYCODONE-ACETAMINOPHEN 5-325 MG PO TABS
1.0000 | ORAL_TABLET | ORAL | Status: DC | PRN
  Administered 2021-03-06 – 2021-03-10 (×9): 1
  Filled 2021-03-05 (×10): qty 1

## 2021-03-05 MED ORDER — SODIUM CHLORIDE 0.9% IV SOLUTION: Freq: Once | INTRAVENOUS | Status: AC

## 2021-03-05 MED ORDER — OXYCODONE HCL 5 MG PO TABS: 5.0000 mg | ORAL_TABLET | ORAL | Status: DC | PRN

## 2021-03-05 MED ORDER — PIVOT 1.5 CAL PO LIQD
1000.0000 mL | ORAL | Status: DC
  Administered 2021-03-06 (×2): 1000 mL
  Filled 2021-03-05 (×9): qty 1000

## 2021-03-05 NOTE — Progress Notes (Signed)
  Subjective: Patient alert, no pain. Urine remains cherry colored requiring CBI, I have posted for cysto/clot evacuation and fulguration tomorrow arounf 1:30 PM. Will make NPO after midnight.  Objective: Vital signs in last 24 hours: Temp:  [97.5 F (36.4 C)-99.2 F (37.3 C)] 99.2 F (37.3 C) (09/11 2205) Pulse Rate:  [91-98] 98 (09/11 2205) Resp:  [16-20] 20 (09/11 2205) BP: (89-145)/(54-72) 145/72 (09/11 2205) SpO2:  [94 %-100 %] 94 % (09/12 0749)  Intake/Output from previous day: 09/11 0701 - 09/12 0700 In: 17414.1 [P.O.:120; I.V.:772.8; Blood:731.3; IV Piggyback:310] Out: 78242 [Urine:19750] Intake/Output this shift: Total I/O In: 3200 [Other:3200] Out: 3750 [Urine:3750]  Physical Exam:  General: Alert and oriented  Lab Results: Recent Labs    03/03/21 0038 03/04/21 0407 03/05/21 0700  HGB 7.0* 5.8* 7.3*  HCT 24.8* 20.0* 24.0*   BMET Recent Labs    03/04/21 0407 03/05/21 0700  NA 140 142  K 3.4* 3.5  CL 106 108  CO2 28 27  GLUCOSE 108* 124*  BUN 23 31*  CREATININE 0.54* 0.46*  CALCIUM 8.4* 8.4*     Studies/Results: No results found.  Assessment/Plan: Gross hematuria,likely prostatic source Scheduled for cysto/clot evacuation,fulguration of prostate/bladder tomorrow.    LOS: 3 days   Shannon [name] 03/05/2021, 11:02 AM

## 2021-03-05 NOTE — Progress Notes (Signed)
Patient foley irrigated manually per verbal order from Dr. Benancio Deeds with approx 200 ml NS.  Moderate amount of clots removed, mostly dime sized.  CBI is running at a moderate rate and flow into foley bag is the same with clear red urine.  Will continue to monitor.

## 2021-03-05 NOTE — Care Management Important Message (Signed)
Important Message  Patient Details IM Letter given to the Patient. Name: Shannon [name] MRN: [id number] Date of Birth: 1945-10-22   Medicare Important Message Given:  Yes     Caren Macadam 03/05/2021, 3:35 PM

## 2021-03-05 NOTE — Progress Notes (Signed)
Shannon [name] [address] [facility]) hospitalized hospice patient visit   [name] is a current hospice patient with a terminal diagnosis of severe protein calorie malnutrition who resides at [facility]. Facility sent patient to the ED for bleeding from meatus, Hospice was notified only after patient was transferred. Shannon [name] was admitted to [facility] on 9.9.2022 with a diagnosis of Sepsis related to UTI. Per Dr. Barbee Shropshire with [facility] this is a related hospital admission.    Visited with patient at bedside and spoke by phone with his daughter Shannon [name].  Patient did not respond to my voice and does not appear in distress. Tube feeding, bladder irrigation in process. The plan is to transfuse one more unit of PRBCs today.   Patient is inpatient appropriate due to need for bladder irrigation, IVAB, and blood transfusions.  VS: 97.8, 145/72,  107, 22, 94%ra I/O: 17414/14150 Abn Labs: 03/05/2021 07:00 Glucose: 124 (H) BUN: 31 (H) Creatinine: 0.46 (L) Calcium: 8.4 (L) RBC: 2.98 (L) Hemoglobin: 7.3 (L) HCT: 24.0 (L) MCH: 24.5 (L) RDW: 17.0 (H) nRBC: 0.3 (H)  Diagnostics: None new IV/PRN Meds: Cefepime 2grams every 8 hours, 3 units PRBC once  Problem list: Sepsis due to complicated urinary tract infection, in the setting of chronic foley cathter (present on admission). Patient on continuous bladder irrigation, continue to have hematuria.    Wbc 10,1. Continue with cefepime # 3 (patient had pseudomonas in the past urine culture from 05/22)   On midodrine, pregabalin, scopolamine and oxycodone.  Continue with Tamsulosin,  Blood pressure this am 145/73 mmHg.    On bladder irrigation, plan for cystoscopy in am.     2. Acute blood loss anemia due to hematuria/ prostate source / iron deficiency. Continuous bladder irrigation    SP 2 PRBC transfusion, hgb up to 7,1 but continue to have hematuria. Plan to transfuse one more unit today, for a total of 3 since  admission.  Continue iron supplementation.  On flomax.    Continue close follow up on cell count.    3. Quadriplegia, swallow dysfunction. Stage 3 vertebral column pressure ulcer. Present on admission.  Very weak and deconditioned. Tolerating well tube feedings. Continue with aspiration precautions.    Local skin pressure ulcer care, continue care per protocol.    4. Hypokalemia.  Electrolytes have been corrected, his renal function continue to be stable with serum cr at 0.46.    Discharge Planning: back to [facility] with hospice services when stable to discharge Family Contact: talked with daughter  by phone IDT: Updated Goals of Care: per daughter to treat what is treatable and then for him to return to facility.    Should patient need ambulance transport at discharge please use [facility] as they contract this service for our active hospice patients.    A Please do not hesitate to call with questions.   Thank you,   Elsie Saas, RN, [facility]      [facility] Liaison   [phone]

## 2021-03-05 NOTE — Progress Notes (Signed)
Patient has refused turns and repositioning during our shift (7a-7p). Patient states 'please leave me alone, don't move me'.  Will continue to monitor and reposition as he allows.

## 2021-03-05 NOTE — Progress Notes (Signed)
PROGRESS NOTE    Shannon [name]  KAJ:[id number] DOB: 06-Sep-1945 DOA: 03/02/2021 PCP: [name], MD    Brief Narrative:  [name] was admitted to the hospital with the working diagnosis of sepsis due to urinary tract infection, complicated with hematuria.   75 year old male past medical history for quadriplegia, Chiari malformation type I, hypertension, osteoarthritis, chronic indwelling Foley catheter, asthma and stage IV sacral decubitus ulcer who was transferred from the skilled nursing facility to the hospital due to significant hematuria.  Patient has been under hospice.  Apparently he had dark red urine with clots, unable to irrigate Foley catheter.  On his initial physical examination his temperature was 98.6, blood pressure 137/93, heart rate 127, respiratory 22, oxygen saturation 95% he had dry mucous membranes, his lungs were clear to auscultation bilaterally, heart S1-S2, present, tachycardic, abdomen was soft, no lower extremity edema.  Patient had contractures in his upper and lower extremities, quadriplegia, noncommunicative.  Aphasic.   Sodium 147, potassium 4.3, chloride 109, bicarb 29, glucose 131, BUN 27, creatinine 0.45, white count 13.3, hemoglobin 8.0, hematocrit 28.8, platelets 274. SARS COVID-19 negative.   Urinalysis specific gravity <1.005,, > 300 protein,, > 50 white cells, > 50 red cells.   Patient was placed on intravenous antibiotic therapy, neurology was consulted and patient was placed on continuous bladder irrigation.   Worsening anemia with Hgb down to 5,8 and hct 20,0 on 03/04/21.  Ordered 2 units PRBC.   09/12 follow up Hgb is 7,1 and continue to have hematuria, transfuse one more unit PRBC today.    Assessment & Plan:   Principal Problem:   Sepsis secondary to UTI Shannon [name]) Active Problems:   Quadriplegia and quadriparesis (HCC)   Acute blood loss anemia   Chronic indwelling Foley catheter   Stage IV pressure ulcer of sacral region  (HCC)   Sepsis due to complicated urinary tract infection, in the setting of chronic foley cathter (present on admission). Patient on continuous bladder irrigation, continue to have hematuria.   Wbc 10,1. Continue with cefepime # 3 (patient had pseudomonas in the past urine culture from 05/22)   On midodrine, pregabalin, scopolamine and oxycodone.  Continue with Tamsulosin,  Blood pressure this am 145/73 mmHg.   On bladder irrigation, plan for cystoscopy in am.     2. Acute blood loss anemia due to hematuria/ prostate source / iron deficiency. Continuous bladder irrigation    SP 2 PRBC transfusion, hgb up to 7,1 but continue to have hematuria. Plan to transfuse one more unit today, for a total of 3 since admission.  Continue iron supplementation.  On flomax.   Continue close follow up on cell count.    3. Quadriplegia, swallow dysfunction. Stage 3 vertebral column pressure ulcer. Present on admission.  Very weak and deconditioned. Tolerating well tube feedings. Continue with aspiration precautions.   Local skin pressure ulcer care, continue care per protocol.    4. Hypokalemia.  Electrolytes have been corrected, his renal function continue to be stable with serum cr at 0,46.     Patient continue to be at high risk for worsening anemia and sepsis.   Status is: Inpatient  Remains inpatient appropriate because:Inpatient level of care appropriate due to severity of illness  Dispo: The patient is from: Home              Anticipated d/c is to: Home              Patient currently is not medically stable to  d/c.   Difficult to place patient No  DVT prophylaxis: Scd   Code Status:    DNR Family Communication:  no family at the bedside       Nutrition Status: Nutrition Problem: Increased nutrient needs Etiology: acute illness, wound healing Signs/Symptoms: estimated needs Interventions: Tube feeding, Prostat, Juven     Consultants:  Urology   Antimicrobials:   Cefepime     Subjective: Patient very weak and deconditioned, responds to questions, no apparent dyspnea or chest pain,.   Objective: Vitals:   03/04/21 1603 03/04/21 1743 03/04/21 2205 03/05/21 0749  BP: 95/64 99/63 (!) 145/72   Pulse: 97 91 98   Resp: 17 18 20    Temp: (!) 97.5 F (36.4 C) 98.7 F (37.1 C) 99.2 F (37.3 C)   TempSrc: Oral Axillary Axillary   SpO2: 100% 100% 95% 94%  Weight:      Height:        Intake/Output Summary (Last 24 hours) at 03/05/2021 1139 Last data filed at 03/05/2021 1009 Gross per 24 hour  Intake 20434.11 ml  Output 05/05/2021 ml  Net -65.89 ml   Filed Weights   03/02/21 2107 03/02/21 2300  Weight: 79.6 kg 80 kg    Examination:   General: Not in pain or dyspnea, deconditioned  Neurology: sleeping but easy to arouse, responds to questions, positive quadriplegia  E ENT: positive pallor, no icterus, oral mucosa moist Cardiovascular: No JVD. S1-S2 present, rhythmic, no gallops, rubs, or murmurs. Trace lower extremity edema. Pulmonary: positive breath sounds bilaterally, with no wheezing, rhonchi or rales. Gastrointestinal. Abdomen soft and non tender Peg tube in place.  Skin. Bilateral boots in place.  Musculoskeletal: no joint deformities     Data Reviewed: I have personally reviewed following labs and imaging studies  CBC: Recent Labs  Lab 03/02/21 1440 03/03/21 0038 03/04/21 0407 03/05/21 0700  WBC 13.3* 11.8* 9.2 10.1  NEUTROABS 8.8*  --   --   --   HGB 8.0* 7.0* 5.8* 7.3*  HCT 28.8* 24.8* 20.0* 24.0*  MCV 79.3* 78.5* 77.8* 80.5  PLT 274 268 220 213   Basic Metabolic Panel: Recent Labs  Lab 03/02/21 1440 03/03/21 0038 03/04/21 0407 03/05/21 0700  NA 147* 141 140 142  K 4.3 3.9 3.4* 3.5  CL 109 105 106 108  CO2 29 29 28 27   GLUCOSE 131* 114* 108* 124*  BUN 27* 23 23 31*  CREATININE 0.45* 0.56* 0.54* 0.46*  CALCIUM 9.5 8.9 8.4* 8.4*   GFR: Estimated Creatinine Clearance: 86.3 mL/min (A) (by C-G formula based on  SCr of 0.46 mg/dL (L)). Liver Function Tests: Recent Labs  Lab 03/03/21 0038  AST 27  ALT 18  ALKPHOS 85  BILITOT 0.9  PROT 8.9*  ALBUMIN 1.9*   No results for input(s): LIPASE, AMYLASE in the last 168 hours. No results for input(s): AMMONIA in the last 168 hours. Coagulation Profile: Recent Labs  Lab 03/03/21 0038  INR 1.5*   Cardiac Enzymes: No results for input(s): CKTOTAL, CKMB, CKMBINDEX, TROPONINI in the last 168 hours. BNP (last 3 results) No results for input(s): PROBNP in the last 8760 hours. HbA1C: No results for input(s): HGBA1C in the last 72 hours. CBG: No results for input(s): GLUCAP in the last 168 hours. Lipid Profile: No results for input(s): CHOL, HDL, LDLCALC, TRIG, CHOLHDL, LDLDIRECT in the last 72 hours. Thyroid Function Tests: No results for input(s): TSH, T4TOTAL, FREET4, T3FREE, THYROIDAB in the last 72 hours. Anemia Panel: No results for input(s):  VITAMINB12, FOLATE, FERRITIN, TIBC, IRON, RETICCTPCT in the last 72 hours.    Radiology Studies: I have reviewed all of the imaging during this hospital visit personally     Scheduled Meds:  sodium chloride   Intravenous Once   sodium chloride   Intravenous Once   albuterol  2.5 mg Inhalation BID   ascorbic acid  1,000 mg Per Tube Daily   Chlorhexidine Gluconate Cloth  6 each Topical Daily   docusate  100 mg Per Tube BID   DULoxetine  30 mg Oral Daily   feeding supplement (PROSource TF)  90 mL Per Tube BID   ferrous sulfate  300 mg Per Tube Q1200   free water  100 mL Per Tube Q4H   guaiFENesin  100 mg Per Tube BID   lactobacillus  1 g Per Tube Daily   midodrine  10 mg Per Tube TID PC   nutrition supplement (JUVEN)  1 packet Per Tube BID BM   oxyCODONE-acetaminophen  1 tablet Per Tube BID   pantoprazole sodium  40 mg Per Tube Daily   pregabalin  100 mg Oral BID   scopolamine  1 patch Transdermal Q72H   tamsulosin  0.4 mg Oral q1800   zinc sulfate  220 mg Per Tube Daily   Continuous  Infusions:  ceFEPime (MAXIPIME) IV 2 g (03/05/21 0507)   feeding supplement (OSMOLITE 1.5 CAL) 1,000 mL (03/04/21 1515)   sodium chloride irrigation       LOS: 3 days        Shannon [name] Gula, MD

## 2021-03-05 NOTE — Progress Notes (Signed)
NUTRITION NOTE  Patient with PEG and is currently ordered Osmolite 1.5 @ 55 ml/hr with 90 ml Prosource TF BID, 1 packet Juven BID, and 100 ml water every 4 hours. This regimen is providing 2330 kcal, 132 grams protein, and 1606 ml free water.  Shannon [name] is currently on backorder. Will adjust TF regimen as outlined below d/t this. Patient can resume Osmolite 1.5 once Juven is back in stock and/or when he discharges. Pivot 1.5 is not needed for patient for any reason other than Juven being unavailable.   Will order Pivot 1.5 @ 60 ml/hr with 100 ml free water every 4 hours which will provide 2160 kcal, 135 grams protein, and 1680 ml free water.    Estimated Nutritional Needs:  Kcal:  2100-2300 kcal Protein:  115-130 grams Fluid:  >/= 2.3 L/day      Trenton Gammon, MS, RD, LDN, CNSC Inpatient Clinical Dietitian RD pager # available in [facility]  After hours/weekend pager # available in [facility]

## 2021-03-05 NOTE — Progress Notes (Signed)
PHARMACY - PHYSICIAN COMMUNICATION CRITICAL VALUE ALERT - BLOOD CULTURE IDENTIFICATION (BCID)  Shannon [name] is an 75 y.o. male  with chronic indrwelling foley who presented to [facility] on 03/02/2021 with a chief complaint of hematuria.  Urology team is seeing patient and plan is for cysto/clot evacuation and fulguration of prostate/bladder on 9/13.   One of three blood culture bottles collected on 03/02/21 has GPC in clusters (BCID= staph species).  Name of physician (or Provider) Contacted: Dr. Ella Jubilee  Current antibiotics: cefepime  Changes to prescribed antibiotics recommended:  - suspects contaminant. Continue with cefepime for now  Results for orders placed or performed during the hospital encounter of 03/02/21  Blood Culture ID Panel (Reflexed) (Collected: 03/02/2021 10:30 PM)  Result Value Ref Range   Enterococcus faecalis NOT DETECTED NOT DETECTED   Enterococcus Faecium NOT DETECTED NOT DETECTED   Listeria monocytogenes NOT DETECTED NOT DETECTED   Staphylococcus species DETECTED (A) NOT DETECTED   Staphylococcus aureus (BCID) NOT DETECTED NOT DETECTED   Staphylococcus epidermidis NOT DETECTED NOT DETECTED   Staphylococcus lugdunensis NOT DETECTED NOT DETECTED   Streptococcus species NOT DETECTED NOT DETECTED   Streptococcus agalactiae NOT DETECTED NOT DETECTED   Streptococcus pneumoniae NOT DETECTED NOT DETECTED   Streptococcus pyogenes NOT DETECTED NOT DETECTED   A.calcoaceticus-baumannii NOT DETECTED NOT DETECTED   Bacteroides fragilis NOT DETECTED NOT DETECTED   Enterobacterales NOT DETECTED NOT DETECTED   Enterobacter cloacae complex NOT DETECTED NOT DETECTED   Escherichia coli NOT DETECTED NOT DETECTED   Klebsiella aerogenes NOT DETECTED NOT DETECTED   Klebsiella oxytoca NOT DETECTED NOT DETECTED   Klebsiella pneumoniae NOT DETECTED NOT DETECTED   Proteus species NOT DETECTED NOT DETECTED   Salmonella species NOT DETECTED NOT DETECTED   Serratia marcescens NOT  DETECTED NOT DETECTED   Haemophilus influenzae NOT DETECTED NOT DETECTED   Neisseria meningitidis NOT DETECTED NOT DETECTED   Pseudomonas aeruginosa NOT DETECTED NOT DETECTED   Stenotrophomonas maltophilia NOT DETECTED NOT DETECTED   Candida albicans NOT DETECTED NOT DETECTED   Candida auris NOT DETECTED NOT DETECTED   Candida glabrata NOT DETECTED NOT DETECTED   Candida krusei NOT DETECTED NOT DETECTED   Candida parapsilosis NOT DETECTED NOT DETECTED   Candida tropicalis NOT DETECTED NOT DETECTED   Cryptococcus neoformans/gattii NOT DETECTED NOT DETECTED    Lucia Gaskins 03/05/2021  6:57 PM

## 2021-03-05 NOTE — Anesthesia Preprocedure Evaluation (Addendum)
Anesthesia Evaluation  Patient identified by MRN, date of birth, ID band Patient awake    Reviewed: Allergy & Precautions, Patient's Chart, lab work & pertinent test results  Airway Mallampati: II  TM Distance: >3 FB     Dental  (+) Missing   Pulmonary asthma (oxygen dependent) , former smoker,    Pulmonary exam normal breath sounds clear to auscultation       Cardiovascular Exercise Tolerance: Good hypertension,  Rhythm:Regular Rate:Normal  12/09/20 Echo IMPRESSIONS    1. Left ventricular ejection fraction, by estimation, is 60 to 65%. The  left ventricle has normal function. Left ventricular endocardial border  not optimally defined to evaluate regional wall motion. There is mild left  ventricular hypertrophy of the  basal-septal segment. Left ventricular diastolic parameters are  indeterminate.  2. Right ventricular systolic function is normal. The right ventricular  size is normal.  3. The mitral valve is normal in structure. No evidence of mitral valve  regurgitation. No evidence of mitral stenosis.  4. The aortic valve is tricuspid. Aortic valve regurgitation is not  visualized. No aortic stenosis is present.  5. Aortic dilatation noted. There is mild dilatation of the aortic root,  measuring 40 mm. There is mild dilatation of the ascending aorta,  measuring 40 mm.  6. The inferior vena cava is normal in size with greater than 50%  respiratory variability, suggesting right atrial pressure of 3 mmHg.    Neuro/Psych Chiari Malformation  Neuromuscular disease (Quadraparesis)    GI/Hepatic negative GI ROS, Neg liver ROS, On Tube feeds.   Endo/Other  negative endocrine ROS  Renal/GU negative Renal ROSLab Results      Component                Value               Date                      CREATININE               0.46 (L)            03/05/2021                BUN                      31 (H)               03/05/2021                NA                       142                 03/05/2021                K                        3.5                 03/05/2021                CL                       108                 03/05/2021  CO2                      27                  03/05/2021              Urinary obstruction    Musculoskeletal  (+) Arthritis , Osteoarthritis,  Stage IV sacral decubitus ulcer   Abdominal Normal abdominal exam  (+)   Peds  Hematology  (+) anemia , Lab Results      Component                Value               Date                      WBC                      10.1                03/05/2021                HGB                      7.3 (L)             03/05/2021                HCT                      24.0 (L)            03/05/2021                MCV                      80.5                03/05/2021                PLT                      213                 03/05/2021              Anesthesia Other Findings Pt w Quadraplegia  All: iodine  +PCN  Reproductive/Obstetrics                           Anesthesia Physical Anesthesia Plan  ASA: 4  Anesthesia Plan: General   Post-op Pain Management:    Induction: Intravenous  PONV Risk Score and Plan: Treatment may vary due to age or medical condition  Airway Management Planned: LMA  Additional Equipment:   Intra-op Plan:   Post-operative Plan: Extubation in OR  Informed Consent: I have reviewed the patients History and Physical, chart, labs and discussed the procedure including the risks, benefits and alternatives for the proposed anesthesia with the patient or authorized representative who has indicated his/her understanding and acceptance.   Patient has DNR.  Discussed DNR with power of attorney and Suspend DNR.   Dental advisory given  Plan Discussed with: CRNA and Anesthesiologist  Anesthesia Plan Comments: (LMA GA)      Anesthesia Quick Evaluation

## 2021-03-06 ENCOUNTER — Inpatient Hospital Stay (HOSPITAL_COMMUNITY): Admitting: Anesthesiology

## 2021-03-06 ENCOUNTER — Encounter (HOSPITAL_COMMUNITY): Payer: Self-pay | Admitting: Internal Medicine

## 2021-03-06 ENCOUNTER — Encounter (HOSPITAL_COMMUNITY)
Admission: EM | Disposition: A | Discharge status: Skilled Nursing Facility | Payer: Self-pay | Source: Home / Self Care | Attending: Internal Medicine

## 2021-03-06 HISTORY — PX: TRANSURETHRAL RESECTION OF PROSTATE: SHX73

## 2021-03-06 LAB — PREPARE RBC (CROSSMATCH)

## 2021-03-06 LAB — CBC
HCT: 25.3 % — ABNORMAL LOW (ref 39.0–52.0)
Hemoglobin: 7.7 g/dL — ABNORMAL LOW (ref 13.0–17.0)
MCH: 24.4 pg — ABNORMAL LOW (ref 26.0–34.0)
MCHC: 30.4 g/dL (ref 30.0–36.0)
MCV: 80.1 fL (ref 80.0–100.0)
Platelets: 186 10*3/uL (ref 150–400)
RBC: 3.16 MIL/uL — ABNORMAL LOW (ref 4.22–5.81)
RDW: 18.4 % — ABNORMAL HIGH (ref 11.5–15.5)
WBC: 9.8 10*3/uL (ref 4.0–10.5)
nRBC: 0.2 % (ref 0.0–0.2)

## 2021-03-06 LAB — BASIC METABOLIC PANEL
Anion gap: 3 — ABNORMAL LOW (ref 5–15)
BUN: 28 mg/dL — ABNORMAL HIGH (ref 8–23)
CO2: 27 mmol/L (ref 22–32)
Calcium: 8.2 mg/dL — ABNORMAL LOW (ref 8.9–10.3)
Chloride: 106 mmol/L (ref 98–111)
Creatinine, Ser: 0.41 mg/dL — ABNORMAL LOW (ref 0.61–1.24)
GFR, Estimated: >60 mL/min (ref >60)
Glucose, Bld: 138 mg/dL — ABNORMAL HIGH (ref 70–99)
Potassium: 3.4 mmol/L — ABNORMAL LOW (ref 3.5–5.1)
Sodium: 136 mmol/L (ref 135–145)

## 2021-03-06 LAB — HEMOGLOBIN AND HEMATOCRIT, BLOOD
HCT: 27.6 % — ABNORMAL LOW (ref 39.0–52.0)
Hemoglobin: 8.2 g/dL — ABNORMAL LOW (ref 13.0–17.0)

## 2021-03-06 SURGERY — TURP (TRANSURETHRAL RESECTION OF PROSTATE)
Anesthesia: General | Site: Urethra

## 2021-03-06 MED ORDER — CHLORHEXIDINE GLUCONATE 0.12 % MT SOLN
15.0000 mL | Freq: Once | OROMUCOSAL | Status: AC
  Administered 2021-03-06: 15 mL via OROMUCOSAL

## 2021-03-06 MED ORDER — FINASTERIDE 5 MG PO TABS
5.0000 mg | ORAL_TABLET | Freq: Every day | ORAL | Status: DC
  Administered 2021-03-06 – 2021-03-10 (×5): 5 mg via ORAL
  Filled 2021-03-06 (×5): qty 1

## 2021-03-06 MED ORDER — POTASSIUM CHLORIDE 20 MEQ PO PACK
40.0000 meq | PACK | ORAL | Status: AC
Start: 2021-03-06 — End: 2021-03-06
  Administered 2021-03-06: 40 meq
  Filled 2021-03-06 (×2): qty 2

## 2021-03-06 MED ORDER — ROCURONIUM BROMIDE 10 MG/ML (PF) SYRINGE
PREFILLED_SYRINGE | INTRAVENOUS | Status: DC | PRN
  Administered 2021-03-06: 60 mg via INTRAVENOUS

## 2021-03-06 MED ORDER — PREGABALIN 50 MG PO CAPS
100.0000 mg | ORAL_CAPSULE | Freq: Two times a day (BID) | ORAL | Status: DC
  Administered 2021-03-07 – 2021-03-10 (×7): 100 mg
  Filled 2021-03-06 (×8): qty 2

## 2021-03-06 MED ORDER — LIDOCAINE 2% (20 MG/ML) 5 ML SYRINGE
INTRAMUSCULAR | Status: AC
  Filled 2021-03-06: qty 5

## 2021-03-06 MED ORDER — MUPIROCIN 2 % EX OINT
1.0000 "application " | TOPICAL_OINTMENT | Freq: Two times a day (BID) | CUTANEOUS | Status: DC
  Administered 2021-03-06 – 2021-03-09 (×7): 1 via NASAL
  Filled 2021-03-06: qty 22

## 2021-03-06 MED ORDER — DEXAMETHASONE SODIUM PHOSPHATE 10 MG/ML IJ SOLN
INTRAMUSCULAR | Status: AC
  Filled 2021-03-06: qty 1

## 2021-03-06 MED ORDER — FENTANYL CITRATE (PF) 100 MCG/2ML IJ SOLN
INTRAMUSCULAR | Status: DC | PRN
  Administered 2021-03-06 (×4): 25 ug via INTRAVENOUS

## 2021-03-06 MED ORDER — PHENYLEPHRINE 40 MCG/ML (10ML) SYRINGE FOR IV PUSH (FOR BLOOD PRESSURE SUPPORT)
PREFILLED_SYRINGE | INTRAVENOUS | Status: DC | PRN
  Administered 2021-03-06: 200 ug via INTRAVENOUS

## 2021-03-06 MED ORDER — LACTATED RINGERS IV SOLN: INTRAVENOUS | Status: DC

## 2021-03-06 MED ORDER — FENTANYL CITRATE (PF) 100 MCG/2ML IJ SOLN
INTRAMUSCULAR | Status: AC
  Filled 2021-03-06: qty 2

## 2021-03-06 MED ORDER — PHENYLEPHRINE HCL-NACL 20-0.9 MG/250ML-% IV SOLN
INTRAVENOUS | Status: DC | PRN
  Administered 2021-03-06: 40 ug/min via INTRAVENOUS

## 2021-03-06 MED ORDER — PROPOFOL 10 MG/ML IV BOLUS
INTRAVENOUS | Status: DC | PRN
  Administered 2021-03-06: 100 mg via INTRAVENOUS

## 2021-03-06 MED ORDER — ONDANSETRON HCL 4 MG/2ML IJ SOLN
INTRAMUSCULAR | Status: AC
  Filled 2021-03-06: qty 2

## 2021-03-06 MED ORDER — EPHEDRINE 5 MG/ML INJ
INTRAVENOUS | Status: AC
  Filled 2021-03-06: qty 5

## 2021-03-06 MED ORDER — PHENYLEPHRINE 40 MCG/ML (10ML) SYRINGE FOR IV PUSH (FOR BLOOD PRESSURE SUPPORT)
PREFILLED_SYRINGE | INTRAVENOUS | Status: AC
  Filled 2021-03-06: qty 10

## 2021-03-06 MED ORDER — ROCURONIUM BROMIDE 10 MG/ML (PF) SYRINGE
PREFILLED_SYRINGE | INTRAVENOUS | Status: AC
  Filled 2021-03-06: qty 10

## 2021-03-06 MED ORDER — SODIUM CHLORIDE 0.9 % IR SOLN
Status: DC | PRN
  Administered 2021-03-06 (×2): 3000 mL

## 2021-03-06 MED ORDER — DEXAMETHASONE SODIUM PHOSPHATE 10 MG/ML IJ SOLN
INTRAMUSCULAR | Status: DC | PRN
  Administered 2021-03-06: 4 mg via INTRAVENOUS

## 2021-03-06 MED ORDER — LIDOCAINE 2% (20 MG/ML) 5 ML SYRINGE
INTRAMUSCULAR | Status: DC | PRN
  Administered 2021-03-06: 80 mg via INTRAVENOUS

## 2021-03-06 MED ORDER — SUGAMMADEX SODIUM 200 MG/2ML IV SOLN
INTRAVENOUS | Status: DC | PRN
  Administered 2021-03-06: 200 mg via INTRAVENOUS

## 2021-03-06 SURGICAL SUPPLY — 19 items
BAG DRN RND TRDRP ANRFLXCHMBR (UROLOGICAL SUPPLIES) ×1
BAG URINE DRAIN 2000ML AR STRL (UROLOGICAL SUPPLIES) ×2 IMPLANT
BAG URO CATCHER STRL LF (MISCELLANEOUS) ×2 IMPLANT
CATH HEMA 3WAY 30CC 22FR COUDE (CATHETERS) IMPLANT
CATH HEMA 3WAY 30CC 24FR COUDE (CATHETERS) ×1 IMPLANT
DRAPE FOOT SWITCH (DRAPES) ×2 IMPLANT
GLOVE SURG ENC TEXT LTX SZ7.5 (GLOVE) ×2 IMPLANT
GOWN STRL REUS W/TWL LRG LVL3 (GOWN DISPOSABLE) ×2 IMPLANT
HOLDER FOLEY CATH W/STRAP (MISCELLANEOUS) IMPLANT
KIT TURNOVER KIT A (KITS) ×2 IMPLANT
LOOP CUT BIPOLAR 24F LRG (ELECTROSURGICAL) ×1 IMPLANT
MANIFOLD NEPTUNE II (INSTRUMENTS) ×2 IMPLANT
PACK CYSTO (CUSTOM PROCEDURE TRAY) ×2 IMPLANT
PENCIL SMOKE EVACUATOR (MISCELLANEOUS) IMPLANT
SYR 30ML LL (SYRINGE) ×2 IMPLANT
SYR TOOMEY IRRIG 70ML (MISCELLANEOUS) ×2
SYRINGE TOOMEY IRRIG 70ML (MISCELLANEOUS) ×1 IMPLANT
TUBING CONNECTING 10 (TUBING) ×2 IMPLANT
TUBING UROLOGY SET (TUBING) ×2 IMPLANT

## 2021-03-06 NOTE — Interval H&P Note (Signed)
History and Physical Interval Note:  03/06/2021 8:14 PM  Shannon [name]  has presented today for surgery, with the diagnosis of HEMATURIA.  The various methods of treatment have been discussed with the patient and family. After consideration of risks, benefits and other options for treatment, the patient has consented to  Procedure(s): CYSTOSCOPY, FULGURATION OF PROSTATE, CLOT EVACUATION (N/A) as a surgical intervention.  The patient's history has been reviewed, patient examined, no change in status, stable for surgery.  I have reviewed the patient's chart and labs.  Questions were answered to the patient's satisfaction.     Ray Church, III

## 2021-03-06 NOTE — Anesthesia Procedure Notes (Signed)
Procedure Name: Intubation Date/Time: 03/06/2021 8:25 PM Performed by: Ezekiel Ina, CRNA Pre-anesthesia Checklist: Patient identified, Emergency Drugs available, Suction available and Patient being monitored Patient Re-evaluated:Patient Re-evaluated prior to induction Oxygen Delivery Method: Circle system utilized Preoxygenation: Pre-oxygenation with 100% oxygen Induction Type: IV induction Ventilation: Mask ventilation without difficulty Laryngoscope Size: Miller and 2 Grade View: Grade II Tube type: Oral Tube size: 7.0 mm Number of attempts: 1 Airway Equipment and Method: Stylet Placement Confirmation: ETT inserted through vocal cords under direct vision, positive ETCO2 and breath sounds checked- equal and bilateral Secured at: 23 cm Tube secured with: Tape Dental Injury: Teeth and Oropharynx as per pre-operative assessment

## 2021-03-06 NOTE — Plan of Care (Signed)
PIVOT is being administered through t Problem: Nutrition: Goal: Adequate nutrition will be maintained Outcome: Completed/Met  he PEG tube

## 2021-03-06 NOTE — Progress Notes (Signed)
Patient was transported to the OR.

## 2021-03-06 NOTE — Progress Notes (Signed)
PROGRESS NOTE    Shannon [name]  FMB:[id number] DOB: 1945/09/28 DOA: 03/02/2021 PCP: [name], MD    Brief Narrative:  Shannon [name] was admitted to the hospital with the working diagnosis of sepsis due to urinary tract infection, complicated with hematuria and acute blood loss.   75 year old male past medical history for quadriplegia, Chiari malformation type I, hypertension, osteoarthritis, chronic indwelling Foley catheter, asthma and stage IV sacral decubitus ulcer who was transferred from the skilled nursing facility to the hospital due to significant hematuria.  Patient has been under hospice.  Apparently he had dark red urine with clots, unable to irrigate Foley catheter.  On his initial physical examination his temperature was 98.6, blood pressure 137/93, heart rate 127, respiratory 22, oxygen saturation 95% he had dry mucous membranes, his lungs were clear to auscultation bilaterally, heart S1-S2, present, tachycardic, abdomen was soft, no lower extremity edema.  Patient had contractures in his upper and lower extremities, quadriplegia, noncommunicative.  Aphasic.   Sodium 147, potassium 4.3, chloride 109, bicarb 29, glucose 131, BUN 27, creatinine 0.45, white count 13.3, hemoglobin 8.0, hematocrit 28.8, platelets 274. SARS COVID-19 negative.   Urinalysis specific gravity <1.005,, > 300 protein,, > 50 white cells, > 50 red cells.   Patient was placed on intravenous antibiotic therapy, urology was consulted and patient was placed on continuous bladder irrigation.   Worsening anemia with Hgb down to 5,8 and hct 20,0 on 03/04/21.  Ordered 2 units PRBC.    09/12 follow up Hgb is 7,1 and continue to have hematuria, transfuse one more unit PRBC.  09/13 plan for cystoscopy per Urology      Assessment & Plan:   Principal Problem:   Sepsis secondary to UTI [facility]) Active Problems:   Quadriplegia and quadriparesis (HCC)   Acute blood loss anemia   Chronic indwelling Foley  catheter   Stage IV pressure ulcer of sacral region (HCC)   Sepsis due to complicated urinary tract infection, in the setting of chronic foley cathter (present on admission). Today urine in the foley bag is more clear at the time of my examination.     Wbc today is 9,8 Continue with cefepime # 4 (patient had pseudomonas in the past urine culture from 05/22)   Patient continue hemodynamically stable, continue with home regimen of midodrine, pregabalin, scopolamine and oxycodone.  On Tamsulosin,  Follow with cystoscopy results.    2. Acute blood loss anemia due to hematuria/ prostate source / iron deficiency.  His urine is more clear today, follow up Hgb is 7,7 with hct at 24,4.  Total PRBC transfusion so far during this hospitalization 3 units. Follow cell count in am. Continue with iron supplementation.     3. Quadriplegia, swallow dysfunction. Stage 3 vertebral column pressure ulcer. Present on admission.  Continue to be very weak and deconditioned, follows with hospice as outpatient. Tube feedings are on hold in preparation for cystoscopy. Aspiration precautions.    Continue with local skin pressure ulcer care, continue care per protocol.    4. Hypokalemia.  Renal function with serum cr at 0,41 with K at 3,4 and serum bicarbonate at 27. Add 40 meq x2 doses and follow up renal function in am. Off IV fluids, he has been tolerating well tube feedings.   Patient continue to be at high risk for worsening anemia   Status is: Inpatient  Remains inpatient appropriate because:Inpatient level of care appropriate due to severity of illness  Dispo: The patient is from: SNF  Anticipated d/c is to: SNF              Patient currently is not medically stable to d/c.   Difficult to place patient No  DVT prophylaxis: Scd   Code Status:    DNR   Family Communication:  I spoke over the phone with the patient's daughter about patient's  condition, plan of care, prognosis and  all questions were addressed.      Nutrition Status: Nutrition Problem: Increased nutrient needs Etiology: acute illness, wound healing Signs/Symptoms: estimated needs Interventions: Tube feeding, Prostat, Juven     Skin Documentation: Pressure Injury 03/02/21 Vertebral column Lower;Medial Stage 4 - Full thickness tissue loss with exposed bone, tendon or muscle. Full thickness wound with exposed tendon and ligaments. Pink with some streaked white slough, with area of beef reddness and  (Active)  03/02/21 2305  Location: Vertebral column  Location Orientation: Lower;Medial  Staging: Stage 4 - Full thickness tissue loss with exposed bone, tendon or muscle.  Wound Description (Comments): Full thickness wound with exposed tendon and ligaments. Pink with some streaked white slough, with area of beef reddness and notable blood supply oozing in the wound bed. 9.5 cm X 6.25 cm 1 cm depth and 1.25 undermining  Present on Admission: Yes     Pressure Injury 06/22/20 Sacrum Posterior Stage 4 - Full thickness tissue loss with exposed bone, tendon or muscle. PHYSICAL THERAPY/HYDROTHERAPY (Active)  06/22/20 1400  Location: Sacrum  Location Orientation: Posterior  Staging: Stage 4 - Full thickness tissue loss with exposed bone, tendon or muscle.  Wound Description (Comments): PHYSICAL THERAPY/HYDROTHERAPY  Present on Admission: Yes     Pressure Injury 12/14/20 Head Lower;Posterior Deep Tissue Pressure Injury - Purple or maroon localized area of discolored intact skin or blood-filled blister due to damage of underlying soft tissue from pressure and/or shear. Maroon discoloration, bleedi (Active)  12/14/20 0600  Location: Head  Location Orientation: Lower;Posterior  Staging: Deep Tissue Pressure Injury - Purple or maroon localized area of discolored intact skin or blood-filled blister due to damage of underlying soft tissue from pressure and/or shear.  Wound Description (Comments): Maroon  discoloration, bleeding, nonblanchable  Present on Admission:      Pressure Injury 02/02/21 Heel Left Deep Tissue Pressure Injury - Purple or maroon localized area of discolored intact skin or blood-filled blister due to damage of underlying soft tissue from pressure and/or shear. (Active)  02/02/21 2244  Location: Heel  Location Orientation: Left  Staging: Deep Tissue Pressure Injury - Purple or maroon localized area of discolored intact skin or blood-filled blister due to damage of underlying soft tissue from pressure and/or shear.  Wound Description (Comments):   Present on Admission:      Pressure Injury 03/02/21 Toe (Comment  which one) Anterior;Left Deep Tissue Pressure Injury - Purple or maroon localized area of discolored intact skin or blood-filled blister due to damage of underlying soft tissue from pressure and/or shear. (Active)  03/02/21 2308  Location: Toe (Comment  which one)  Location Orientation: Anterior;Left  Staging: Deep Tissue Pressure Injury - Purple or maroon localized area of discolored intact skin or blood-filled blister due to damage of underlying soft tissue from pressure and/or shear.  Wound Description (Comments):   Present on Admission: Yes     Consultants:  Urology    Antimicrobials:  Cefepime     Subjective:  Patient with no nausea or vomiting, no dyspnea or chest pain, at the time of my examination the urine is more clear.  Objective: Vitals:   03/06/21 0500 03/06/21 0755 03/06/21 0820 03/06/21 1253  BP: 108/63   115/63  Pulse: 92  (!) 106 96  Resp: 16  16 20   Temp: 98.7 F (37.1 C)  97.9 F (36.6 C) 98.5 F (36.9 C)  TempSrc: Axillary  Oral Axillary  SpO2: 97% 95% 97% 100%  Weight:      Height:        Intake/Output Summary (Last 24 hours) at 03/06/2021 1340 Last data filed at 03/06/2021 1320 Gross per 24 hour  Intake 12695.06 ml  Output 03/08/2021 ml  Net -404.94 ml   Filed Weights   03/02/21 2107 03/02/21 2300  Weight: 79.6 kg 80  kg    Examination:   General: Not in pain or dyspnea, deconditioned  Neurology: Awake and alert, non focal  E ENT: mild pallor, no icterus, oral mucosa moist Cardiovascular: No JVD. S1-S2 present, rhythmic, no gallops, rubs, or murmurs. No lower extremity edema. Pulmonary: positive breath sounds bilaterally, adequate air movement, no wheezing, rhonchi or rales. Gastrointestinal. Abdomen soft and non tender, peg tube in place.  Skin. No rashes Musculoskeletal: no joint deformities    Data Reviewed: I have personally reviewed following labs and imaging studies  CBC: Recent Labs  Lab 03/02/21 1440 03/03/21 0038 03/04/21 0407 03/05/21 0700 03/06/21 0326  WBC 13.3* 11.8* 9.2 10.1 9.8  NEUTROABS 8.8*  --   --   --   --   HGB 8.0* 7.0* 5.8* 7.3* 7.7*  HCT 28.8* 24.8* 20.0* 24.0* 25.3*  MCV 79.3* 78.5* 77.8* 80.5 80.1  PLT 274 268 220 213 186   Basic Metabolic Panel: Recent Labs  Lab 03/02/21 1440 03/03/21 0038 03/04/21 0407 03/05/21 0700 03/06/21 0326  NA 147* 141 140 142 136  K 4.3 3.9 3.4* 3.5 3.4*  CL 109 105 106 108 106  CO2 29 29 28 27 27   GLUCOSE 131* 114* 108* 124* 138*  BUN 27* 23 23 31* 28*  CREATININE 0.45* 0.56* 0.54* 0.46* 0.41*  CALCIUM 9.5 8.9 8.4* 8.4* 8.2*   GFR: Estimated Creatinine Clearance: 86.3 mL/min (A) (by C-G formula based on SCr of 0.41 mg/dL (L)). Liver Function Tests: Recent Labs  Lab 03/03/21 0038  AST 27  ALT 18  ALKPHOS 85  BILITOT 0.9  PROT 8.9*  ALBUMIN 1.9*   No results for input(s): LIPASE, AMYLASE in the last 168 hours. No results for input(s): AMMONIA in the last 168 hours. Coagulation Profile: Recent Labs  Lab 03/03/21 0038  INR 1.5*   Cardiac Enzymes: No results for input(s): CKTOTAL, CKMB, CKMBINDEX, TROPONINI in the last 168 hours. BNP (last 3 results) No results for input(s): PROBNP in the last 8760 hours. HbA1C: No results for input(s): HGBA1C in the last 72 hours. CBG: No results for input(s): GLUCAP in  the last 168 hours. Lipid Profile: No results for input(s): CHOL, HDL, LDLCALC, TRIG, CHOLHDL, LDLDIRECT in the last 72 hours. Thyroid Function Tests: No results for input(s): TSH, T4TOTAL, FREET4, T3FREE, THYROIDAB in the last 72 hours. Anemia Panel: No results for input(s): VITAMINB12, FOLATE, FERRITIN, TIBC, IRON, RETICCTPCT in the last 72 hours.    Radiology Studies: I have reviewed all of the imaging during this hospital visit personally     Scheduled Meds:  sodium chloride   Intravenous Once   albuterol  2.5 mg Inhalation BID   ascorbic acid  1,000 mg Per Tube Daily   Chlorhexidine Gluconate Cloth  6 each Topical Daily   docusate  100 mg  Per Tube BID   DULoxetine  30 mg Oral Daily   ferrous sulfate  300 mg Per Tube Q1200   free water  100 mL Per Tube Q4H   guaiFENesin  100 mg Per Tube BID   lactobacillus  1 g Per Tube Daily   midodrine  10 mg Per Tube TID PC   mupirocin ointment  1 application Nasal BID   pantoprazole sodium  40 mg Per Tube Daily   pregabalin  100 mg Per Tube BID   scopolamine  1 patch Transdermal Q72H   tamsulosin  0.4 mg Oral q1800   zinc sulfate  220 mg Per Tube Daily   Continuous Infusions:  ceFEPime (MAXIPIME) IV Stopped (03/06/21 9030)   feeding supplement (PIVOT 1.5 CAL) 1,000 mL (03/06/21 0006)   sodium chloride irrigation       LOS: 4 days        Leler Brion Annett Gula, MD

## 2021-03-06 NOTE — Progress Notes (Signed)
Shannon [name] [address] [facility]) hospitalized hospice patient visit   Shannon [name] is a current hospice patient with a terminal diagnosis of severe protein calorie malnutrition who resides at [facility]. Facility sent patient to the ED for bleeding from meatus, Hospice was notified only after patient was transferred. Shannon [name] was admitted to [facility] on 9.9.2022 with a diagnosis of Sepsis related to UTI. Per Dr. Barbee Shropshire with [facility] this is a related hospital admission.    Visited with patient at bedside and spoke by phone with his daughter Shannon [name]. Was planned to have cystoscopy today but tube feedings were not stopped. Procedure maybe delayed. Continues to receive bladder irrigation.   Patient is inpatient appropriate due to need for bladder irrigation, IVAB, and blood transfusions.   VS: 98.5, 115/63, 96, 20, 100%RA I/O: 15885/17750 Abnormal Labs: 03/06/2021 03:26 Potassium: 3.4 (L) Glucose: 138 (H) BUN: 28 (H) Creatinine: 0.41 (L) Calcium: 8.2 (L) Anion gap: 3 (L) RBC: 3.16 (L) Hemoglobin: 7.7 (L) HCT: 25.3 (L) MCH: 24.4 (L) RDW: 18.4 (H)  Diagnostics: None new IV/PRN Meds: Cefepime 2grams every 8 hours, Percocet 5-325mg  PRN @ 00:04  No new notes from MD today. Problem list: Sepsis due to complicated urinary tract infection, in the setting of chronic foley cathter (present on admission). Patient on continuous bladder irrigation, continue to have hematuria.    Wbc 10,1. Continue with cefepime # 3 (patient had pseudomonas in the past urine culture from 05/22)   On midodrine, pregabalin, scopolamine and oxycodone.  Continue with Tamsulosin,  Blood pressure this am 145/73 mmHg.    On bladder irrigation, plan for cystoscopy in am.     2. Acute blood loss anemia due to hematuria/ prostate source / iron deficiency. Continuous bladder irrigation    SP 2 PRBC transfusion, hgb up to 7,1 but continue to have hematuria. Plan to transfuse one more unit  today, for a total of 3 since admission.  Continue iron supplementation.  On flomax.    Continue close follow up on cell count.    3. Quadriplegia, swallow dysfunction. Stage 3 vertebral column pressure ulcer. Present on admission.  Very weak and deconditioned. Tolerating well tube feedings. Continue with aspiration precautions.    Local skin pressure ulcer care, continue care per protocol.    4. Hypokalemia.  Electrolytes have been corrected, his renal function continue to be stable with serum cr at 0.46.     Discharge Planning: back to [facility] with hospice services when stable to discharge Family Contact: talked with daughter  by phone IDT: Updated Goals of Care: per daughter to treat what is treatable and then for him to return to facility.    Should patient need ambulance transport at discharge please use [facility] as they contract this service for our active hospice patients.    A Please do not hesitate to call with questions.   Thank you,   Elsie Saas, RN, [facility]      [facility] Liaison   [phone]

## 2021-03-06 NOTE — Transfer of Care (Signed)
Immediate Anesthesia Transfer of Care Note  Patient: Shannon [name]  Procedure(s) Performed: CYSTOSCOPY, FULGURATION OF PROSTATE, CLOT EVACUATION (Urethra)  Patient Location: PACU  Anesthesia Type:General  Level of Consciousness: awake  Airway & Oxygen Therapy: Patient Spontanous Breathing  Post-op Assessment: Report given to RN and Post -op Vital signs reviewed and stable  Post vital signs: Reviewed and stable  Last Vitals:  Vitals Value Taken Time  BP 140/83 03/06/21 2115  Temp    Pulse 98 03/06/21 2118  Resp 17 03/06/21 2118  SpO2 97 % 03/06/21 2118  Vitals shown include unvalidated device data.  Last Pain:  Vitals:   03/06/21 1838  TempSrc: Oral  PainSc:       Patients Stated Pain Goal: 2 (03/05/21 1445)  Complications: No notable events documented.

## 2021-03-06 NOTE — Op Note (Addendum)
Operative Note  Preoperative diagnosis:  1.  Gross hematuria  Postoperative diagnosis: 1.  BPH with hematuria 2.  Bladder calculus  Procedure(s): 1.  Transurethral resection of prostate 2.  Cystoscopy with extraction of bladder calculus  Surgeon: Modena Slater, MD  Assistants: None  Anesthesia: General  Complications: None immediate  EBL: Minimal  Specimens: 1.  Prostate chips  Drains/Catheters: 1.  24 French three-way Foley catheter with CBI initiated  Intraoperative findings: 1.  Normal anterior urethra 2.  Obstructing prostate with a large median lobe.  The median lobe was hypervascular and the likely source of bleeding therefore it was resected.  Bladder mucosa had some trabeculation but no tumors.  There was a 1 cm bladder calculus.  Indication: 75 year old male with gross hematuria requiring transfusion who has required cystoscopy with clot evacuation and fulguration of median lobe in the past presents for cystoscopy with clot evacuation with possible interventions.  Description of procedure:  The patient was identified and consent was obtained.  The patient was taken to the operating room and placed in the supine position.  The patient was placed under general anesthesia.  Perioperative antibiotics were administered.  The patient was placed in dorsal lithotomy.  Patient was prepped and draped in a standard sterile fashion and a timeout was performed.  A 26 French resectoscope with a visual obturator in place was advanced into the urethra and into the bladder.  Small amount of clot was evacuated.  I exchanged for the bipolar working element.  I inspected the entire bladder mucosa and there were no tumors.  There was obvious hypervascularity to the prostate median lobe that was likely the cause of the patient's recurrent hematuria.  Therefore, I resected the median lobe and fulgurated the resection bed.  I fulgurated some bleeding areas of the lateral lobes but did not feel  like resection of the lateral lobes was warranted at this time.  I evacuated all prostate chips and collected these for specimen.  I extracted the bladder calculus.  I reinspected the mucosa.  There was no active bleeding.  Resection was well away from bilateral ureteral orifices.  There was adequate hemostasis and therefore the scope was withdrawn.  I placed a 24 French three-way Foley catheter and this concluded the operation.  Patient tolerated the procedure well and was stable postoperative.  Plan: Continue CBI and hopefully wean off tomorrow.  Continue to follow H&H.  I will start finasteride 5 mg daily to help decrease the risk of future hematuria secondary to his prostate.

## 2021-03-06 NOTE — TOC Progression Note (Signed)
Transition of Care [facility]) - Progression Note    Patient Details  Name: Shannon [name] MRN: [id number] Date of Birth: 1945/10/17  [facility]) CM/SW Contact  Geni Bers, RN Phone Number: 03/06/2021, 1:57 PM  Clinical Narrative:    Pt from [facility] with [facility].    Expected Discharge Plan: Skilled Nursing Facility Barriers to Discharge: No Barriers Identified  Expected Discharge Plan and Services Expected Discharge Plan: Skilled Nursing Facility       Living arrangements for the past 2 months: Skilled Nursing Facility                                       Social Determinants of Health (SDOH) Interventions    Readmission Risk Interventions No flowsheet data found.

## 2021-03-06 NOTE — Anesthesia Postprocedure Evaluation (Signed)
Anesthesia Post Note  Patient: Shannon [name]  Procedure(s) Performed: CYSTOSCOPY, FULGURATION OF PROSTATE, CLOT EVACUATION (Urethra)     Patient location during evaluation: PACU Anesthesia Type: General Level of consciousness: awake Pain management: pain level controlled Vital Signs Assessment: post-procedure vital signs reviewed and stable Respiratory status: spontaneous breathing Cardiovascular status: stable Postop Assessment: no apparent nausea or vomiting Anesthetic complications: no   No notable events documented.  Last Vitals:  Vitals:   03/06/21 2130 03/06/21 2145  BP: 119/70 126/71  Pulse: 93 93  Resp: 20 20  Temp:  36.8 C  SpO2: 96% 97%    Last Pain:  Vitals:   03/06/21 2145  TempSrc:   PainSc: 0-No pain                 Edra Riccardi

## 2021-03-07 ENCOUNTER — Encounter (HOSPITAL_COMMUNITY): Payer: Self-pay | Admitting: Urology

## 2021-03-07 DIAGNOSIS — L89154 Pressure ulcer of sacral region, stage 4: Secondary | ICD-10-CM

## 2021-03-07 DIAGNOSIS — D62 Acute posthemorrhagic anemia: Secondary | ICD-10-CM

## 2021-03-07 DIAGNOSIS — Z978 Presence of other specified devices: Secondary | ICD-10-CM

## 2021-03-07 DIAGNOSIS — G8250 Quadriplegia, unspecified: Secondary | ICD-10-CM

## 2021-03-07 DIAGNOSIS — K921 Melena: Secondary | ICD-10-CM

## 2021-03-07 DIAGNOSIS — R319 Hematuria, unspecified: Secondary | ICD-10-CM

## 2021-03-07 LAB — BASIC METABOLIC PANEL
Anion gap: 7 (ref 5–15)
BUN: 21 mg/dL (ref 8–23)
CO2: 26 mmol/L (ref 22–32)
Calcium: 8.3 mg/dL — ABNORMAL LOW (ref 8.9–10.3)
Chloride: 102 mmol/L (ref 98–111)
Creatinine, Ser: 0.33 mg/dL — ABNORMAL LOW (ref 0.61–1.24)
GFR, Estimated: >60 mL/min (ref >60)
Glucose, Bld: 110 mg/dL — ABNORMAL HIGH (ref 70–99)
Potassium: 4.1 mmol/L (ref 3.5–5.1)
Sodium: 135 mmol/L (ref 135–145)

## 2021-03-07 LAB — CULTURE, BLOOD (ROUTINE X 2): Special Requests: ADEQUATE

## 2021-03-07 LAB — OCCULT BLOOD X 1 CARD TO LAB, STOOL: Fecal Occult Bld: POSITIVE — AB

## 2021-03-07 LAB — CBC
HCT: 27 % — ABNORMAL LOW (ref 39.0–52.0)
Hemoglobin: 8 g/dL — ABNORMAL LOW (ref 13.0–17.0)
MCH: 23.8 pg — ABNORMAL LOW (ref 26.0–34.0)
MCHC: 29.6 g/dL — ABNORMAL LOW (ref 30.0–36.0)
MCV: 80.4 fL (ref 80.0–100.0)
Platelets: 201 10*3/uL (ref 150–400)
RBC: 3.36 MIL/uL — ABNORMAL LOW (ref 4.22–5.81)
RDW: 19.1 % — ABNORMAL HIGH (ref 11.5–15.5)
WBC: 12.1 10*3/uL — ABNORMAL HIGH (ref 4.0–10.5)
nRBC: 0 % (ref 0.0–0.2)

## 2021-03-07 LAB — SURGICAL PCR SCREEN
MRSA, PCR: POSITIVE — AB
Staphylococcus aureus: POSITIVE — AB

## 2021-03-07 MED ORDER — PANTOPRAZOLE SODIUM 40 MG PO PACK
40.0000 mg | PACK | Freq: Two times a day (BID) | ORAL | Status: DC
  Administered 2021-03-07 – 2021-03-08 (×2): 40 mg
  Filled 2021-03-07 (×4): qty 20

## 2021-03-07 NOTE — Progress Notes (Signed)
Urology Inpatient Progress Report  Sepsis due to gram-negative UTI (HCC) [A41.50, N39.0]  Procedure(s): CYSTOSCOPY, FULGURATION OF PROSTATE, CLOT EVACUATION  1 Day Post-Op   Intv/Subj: No acute events overnight.  Urine clear off CBI.  Hemoglobin stable  Principal Problem:   Sepsis secondary to UTI [facility]) Active Problems:   Quadriplegia and quadriparesis (HCC)   Acute blood loss anemia   Chronic indwelling Foley catheter   Stage IV pressure ulcer of sacral region (HCC)  Current Facility-Administered Medications  Medication Dose Route Frequency Provider Last Rate Last Admin   0.9 %  sodium chloride infusion (Manually program via Guardrails IV Fluids)   Intravenous Once Audrea Muscat T, NP       acetaminophen (TYLENOL) tablet 650 mg  650 mg Oral Q6H PRN Rometta Emery, MD   650 mg at 03/05/21 1446   Or   acetaminophen (TYLENOL) suppository 650 mg  650 mg Rectal Q6H PRN Rometta Emery, MD       albuterol (PROVENTIL) (2.5 MG/3ML) 0.083% nebulizer solution 2.5 mg  2.5 mg Inhalation BID Arrien, York Ram, MD   2.5 mg at 03/06/21 6222   ascorbic acid (VITAMIN C) tablet 1,000 mg  1,000 mg Per Tube Daily Arrien, York Ram, MD   1,000 mg at 03/06/21 0949   ceFEPIme (MAXIPIME) 2 g in sodium chloride 0.9 % 100 mL IVPB  2 g Intravenous Q8H Lynden Ang, RPH 200 mL/hr at 03/07/21 0519 2 g at 03/07/21 9798   Chlorhexidine Gluconate Cloth 2 % PADS 6 each  6 each Topical Daily Arrien, York Ram, MD   6 each at 03/06/21 0950   docusate (COLACE) 50 MG/5ML liquid 100 mg  100 mg Per Tube BID Coralie Keens, MD   100 mg at 03/06/21 0950   DULoxetine (CYMBALTA) DR capsule 30 mg  30 mg Oral Daily Arrien, York Ram, MD   30 mg at 03/05/21 9211   feeding supplement (PIVOT 1.5 CAL) liquid 1,000 mL  1,000 mL Per Tube Continuous Coralie Keens, MD 60 mL/hr at 03/06/21 2354 1,000 mL at 03/06/21 2354   ferrous sulfate 300 (60 Fe) MG/5ML syrup 300 mg  300 mg Per Tube  Q1200 Arrien, York Ram, MD   300 mg at 03/06/21 1115   finasteride (PROSCAR) tablet 5 mg  5 mg Oral Daily Ray Church III, MD   5 mg at 03/06/21 2352   free water 100 mL  100 mL Per Tube Q4H Arrien, York Ram, MD   100 mL at 03/07/21 0430   guaiFENesin (ROBITUSSIN) 100 MG/5ML solution 100 mg  100 mg Per Tube BID Coralie Keens, MD   100 mg at 03/06/21 0950   lactobacillus (FLORANEX/LACTINEX) granules 1 g  1 g Per Tube Daily Arrien, York Ram, MD   1 g at 03/06/21 0949   midodrine (PROAMATINE) tablet 10 mg  10 mg Per Tube TID PC Arrien, York Ram, MD   10 mg at 03/06/21 1810   mupirocin ointment (BACTROBAN) 2 % 1 application  1 application Nasal BID Arrien, York Ram, MD   1 application at 03/06/21 1601   ondansetron (ZOFRAN) tablet 4 mg  4 mg Oral Q6H PRN Rometta Emery, MD       Or   ondansetron (ZOFRAN) injection 4 mg  4 mg Intravenous Q6H PRN Rometta Emery, MD   4 mg at 03/06/21 2026   oxyCODONE-acetaminophen (PERCOCET/ROXICET) 5-325 MG per tablet 1 tablet  1 tablet Per Tube Q4H PRN  Arrien, York Ram, MD   1 tablet at 03/06/21 0004   pantoprazole sodium (PROTONIX) 40 mg oral suspension 40 mg  40 mg Per Tube Daily Arrien, York Ram, MD   40 mg at 03/06/21 0955   pregabalin (LYRICA) capsule 100 mg  100 mg Per Tube BID Arrien, York Ram, MD       scopolamine (TRANSDERM-SCOP) 1 MG/3DAYS 1.5 mg  1 patch Transdermal Q72H Arrien, York Ram, MD   1.5 mg at 03/06/21 1812   sodium chloride irrigation 0.9 % 3,000 mL  3,000 mL Irrigation Continuous Arby Barrette, MD   3,000 mL at 03/04/21 1652   tamsulosin (FLOMAX) capsule 0.4 mg  0.4 mg Oral q1800 Arrien, York Ram, MD   0.4 mg at 03/06/21 1810   zinc sulfate capsule 220 mg  220 mg Per Tube Daily Arrien, York Ram, MD   220 mg at 03/06/21 0949     Objective: Vital: Vitals:   03/06/21 2130 03/06/21 2145 03/06/21 2206 03/07/21 0500  BP: 119/70 126/71 122/72 118/84   Pulse: 93 93 84 87  Resp: 20 20 14 12   Temp:  98.3 F (36.8 C) 98.5 F (36.9 C) 98.9 F (37.2 C)  TempSrc:   Oral Oral  SpO2: 96% 97% 97% 98%  Weight:      Height:       I/Os: I/O last 3 completed shifts: In: 9900 [I.V.:600; Other:9000; IV Piggyback:300] Out: [Urine:11700]  Physical Exam:  General: Patient is in no apparent distress Lungs: Normal respiratory effort, chest expands symmetrically. GI: The abdomen is soft and nontender without mass. Foley: Three-way Foley catheter in place.  CBI off and urine draining clear yellow. Ext: lower extremities symmetric  Lab Results: Recent Labs    03/05/21 0700 03/06/21 0326 03/06/21 2311 03/07/21 0015  WBC 10.1 9.8  --  12.1*  HGB 7.3* 7.7* 8.2* 8.0*  HCT 24.0* 25.3* 27.6* 27.0*   Recent Labs    03/05/21 0700 03/06/21 0326 03/07/21 0015  NA 142 136 135  K 3.5 3.4* 4.1  CL 108 106 102  CO2 27 27 26   GLUCOSE 124* 138* 110*  BUN 31* 28* 21  CREATININE 0.46* 0.41* 0.33*  CALCIUM 8.4* 8.2* 8.3*   No results for input(s): LABPT, INR in the last 72 hours. No results for input(s): LABURIN in the last 72 hours. Results for orders placed or performed during the hospital encounter of 03/02/21  Resp Panel by RT-PCR (Flu A&B, Covid) Nasopharyngeal Swab     Status: None   Collection Time: 03/02/21  6:58 PM   Specimen: Nasopharyngeal Swab; Nasopharyngeal(NP) swabs in vial transport medium  Result Value Ref Range Status   SARS Coronavirus 2 by RT PCR NEGATIVE NEGATIVE Final    Comment: (NOTE) SARS-CoV-2 target nucleic acids are NOT DETECTED.  The SARS-CoV-2 RNA is generally detectable in upper respiratory specimens during the acute phase of infection. The lowest concentration of SARS-CoV-2 viral copies this assay can detect is 138 copies/mL. A negative result does not preclude SARS-Cov-2 infection and should not be used as the sole basis for treatment or other patient management decisions. A negative result may occur  with  improper specimen collection/handling, submission of specimen other than nasopharyngeal swab, presence of viral mutation(s) within the areas targeted by this assay, and inadequate number of viral copies(<138 copies/mL). A negative result must be combined with clinical observations, patient history, and epidemiological information. The expected result is Negative.  Fact Sheet for Patients:  05/02/21  Fact Sheet  for Healthcare Providers:  SeriousBroker.it  This test is no t yet approved or cleared by the [place] and  has been authorized for detection and/or diagnosis of SARS-CoV-2 by FDA under an Emergency Use Authorization (EUA). This EUA will remain  in effect (meaning this test can be used) for the duration of the COVID-19 declaration under Section 564(b)(1) of the Act, 21 U.S.C.section 360bbb-3(b)(1), unless the authorization is terminated  or revoked sooner.       Influenza A by PCR NEGATIVE NEGATIVE Final   Influenza B by PCR NEGATIVE NEGATIVE Final    Comment: (NOTE) The Xpert Xpress SARS-CoV-2/FLU/RSV plus assay is intended as an aid in the diagnosis of influenza from Nasopharyngeal swab specimens and should not be used as a sole basis for treatment. Nasal washings and aspirates are unacceptable for Xpert Xpress SARS-CoV-2/FLU/RSV testing.  Fact Sheet for Patients: BloggerCourse.com  Fact Sheet for Healthcare Providers: SeriousBroker.it  This test is not yet approved or cleared by the [place] FDA and has been authorized for detection and/or diagnosis of SARS-CoV-2 by FDA under an Emergency Use Authorization (EUA). This EUA will remain in effect (meaning this test can be used) for the duration of the COVID-19 declaration under Section 564(b)(1) of the Act, 21 U.S.C. section 360bbb-3(b)(1), unless the authorization is terminated  or revoked.  Performed at [facility], [address] [place], [place] [address]   Culture, blood (routine x 2)     Status: Abnormal (Preliminary result)   Collection Time: 03/02/21 10:30 PM   Specimen: BLOOD  Result Value Ref Range Status   Specimen Description   Final    BLOOD BLOOD LEFT HAND Performed at [facility], [address], [place], [place] [address]    Special Requests   Final    BOTTLES DRAWN AEROBIC AND ANAEROBIC Blood Culture adequate volume Performed at [facility], [address] [place], [place] [address]    Culture  Setup Time   Final    GRAM POSITIVE COCCI IN CLUSTERS ANAEROBIC BOTTLE ONLY Organism ID to follow CRITICAL RESULT CALLED TO, READ BACK BY AND VERIFIED WITH: A PHAM PHARMD [id number] 03/05/21 A BROWNING    Culture (A)  Final    STAPHYLOCOCCUS COHNII THE SIGNIFICANCE OF ISOLATING THIS ORGANISM FROM A SINGLE SET OF BLOOD CULTURES WHEN MULTIPLE SETS ARE DRAWN IS UNCERTAIN. PLEASE NOTIFY THE MICROBIOLOGY DEPARTMENT WITHIN ONE WEEK IF SPECIATION AND SENSITIVITIES ARE REQUIRED. Performed at [facility], [address] [place], [place] [address]    Report Status PENDING  Incomplete  Blood Culture ID Panel (Reflexed)     Status: Abnormal   Collection Time: 03/02/21 10:30 PM  Result Value Ref Range Status   Enterococcus faecalis NOT DETECTED NOT DETECTED Final   Enterococcus Faecium NOT DETECTED NOT DETECTED Final   Listeria monocytogenes NOT DETECTED NOT DETECTED Final   Staphylococcus species DETECTED (A) NOT DETECTED Final    Comment: CRITICAL RESULT CALLED TO, READ BACK BY AND VERIFIED WITH: A PHAM PHARMD [id number] 03/05/21 A BROWNING    Staphylococcus aureus (BCID) NOT DETECTED NOT DETECTED Final   Staphylococcus epidermidis NOT DETECTED NOT DETECTED Final   Staphylococcus lugdunensis NOT DETECTED NOT DETECTED Final   Streptococcus species NOT DETECTED NOT DETECTED Final   Streptococcus  agalactiae NOT DETECTED NOT DETECTED Final   Streptococcus pneumoniae NOT DETECTED NOT DETECTED Final   Streptococcus pyogenes NOT DETECTED NOT DETECTED Final   A.calcoaceticus-baumannii NOT DETECTED NOT DETECTED Final  Bacteroides fragilis NOT DETECTED NOT DETECTED Final   Enterobacterales NOT DETECTED NOT DETECTED Final   Enterobacter cloacae complex NOT DETECTED NOT DETECTED Final   Escherichia coli NOT DETECTED NOT DETECTED Final   Klebsiella aerogenes NOT DETECTED NOT DETECTED Final   Klebsiella oxytoca NOT DETECTED NOT DETECTED Final   Klebsiella pneumoniae NOT DETECTED NOT DETECTED Final   Proteus species NOT DETECTED NOT DETECTED Final   Salmonella species NOT DETECTED NOT DETECTED Final   Serratia marcescens NOT DETECTED NOT DETECTED Final   Haemophilus influenzae NOT DETECTED NOT DETECTED Final   Neisseria meningitidis NOT DETECTED NOT DETECTED Final   Pseudomonas aeruginosa NOT DETECTED NOT DETECTED Final   Stenotrophomonas maltophilia NOT DETECTED NOT DETECTED Final   Candida albicans NOT DETECTED NOT DETECTED Final   Candida auris NOT DETECTED NOT DETECTED Final   Candida glabrata NOT DETECTED NOT DETECTED Final   Candida krusei NOT DETECTED NOT DETECTED Final   Candida parapsilosis NOT DETECTED NOT DETECTED Final   Candida tropicalis NOT DETECTED NOT DETECTED Final   Cryptococcus neoformans/gattii NOT DETECTED NOT DETECTED Final    Comment: Performed at [facility], [address], [place], [place] [address]  Culture, blood (routine x 2)     Status: None (Preliminary result)   Collection Time: 03/03/21 12:38 AM   Specimen: BLOOD  Result Value Ref Range Status   Specimen Description   Final    BLOOD RIGHT WRIST Performed at [facility], [address], [place], [place] [address]    Special Requests   Final    BOTTLES DRAWN AEROBIC ONLY Blood Culture adequate volume Performed at [facility], [address]  [place], [place] [address]    Culture   Final    NO GROWTH 3 DAYS Performed at [facility], [address] [place], [place] [address]    Report Status PENDING  Incomplete  Surgical PCR screen     Status: Abnormal   Collection Time: 03/06/21  2:23 PM   Specimen: Nasal Mucosa; Nasal Swab  Result Value Ref Range Status   MRSA, PCR POSITIVE (A) NEGATIVE Final    Comment: RESULT CALLED TO, READ BACK BY AND VERIFIED WITH: RN Shela [facility] AT 9357 03/07/21 CRUICKSHANK A    Staphylococcus aureus POSITIVE (A) NEGATIVE Final    Comment: (NOTE) The Xpert SA Assay (FDA approved for NASAL specimens in patients 32 years of age and older), is one component of a comprehensive surveillance program. It is not intended to diagnose infection nor to guide or monitor treatment. Performed at [facility], [address], [place], [place] [address]     Studies/Results: No results found.  Assessment: Gross hematuria secondary to BPH  Procedure(s): CYSTOSCOPY, FULGURATION OF PROSTATE, CLOT EVACUATION, 1 Day Post-Op  doing well.  Plan: Continue Foley catheter.  Can cap the third port of the catheter and keep Foley to drainage.  No further CBI needed.  Continue finasteride 5 mg daily and he should be discharged on this.  Stable for discharge from a urological standpoint when deemed appropriate by primary team   Modena Slater, MD Urology 03/07/2021, 7:52 AM

## 2021-03-07 NOTE — Progress Notes (Signed)
Shannon [name] [address] [facility]) hospitalized hospice patient visit   Shannon [name] is a current hospice patient with a terminal diagnosis of severe protein calorie malnutrition who resides at [facility]. Facility sent patient to the ED for bleeding from meatus, Hospice was notified only after patient was transferred. Shannon [name] was admitted to [facility] on 9.9.2022 with a diagnosis of Sepsis related to UTI. Per Dr. Barbee Shropshire with [facility] this is a related hospital admission.   Visited at bedside. Niece was present also. Patient is awake and alert. He denied pain. Was talkative and engaged. Continues to receive tube feed and IV antibiotics.  Cystoscopy was completed yesterday.   Patient is inpatient appropriate due to need for  IVAB, and follow up evaluation  from procedure.  VS: 98.6, 125/71, 96, 18, 98%ra I/O: 1100/3950  Abnormal Labs:  03/07/2021 00:15 Glucose: 110 (H) BUN: 21 Creatinine: 0.33 (L) Calcium: 8.3 (L) WBC: 12.1 (H) RBC: 3.36 (L) Hemoglobin: 8.0 (L) HCT: 27.0 (L) MCH: 23.8 (L) MCHC: 29.6 (L) RDW: 19.1 (H)  IV/PRN Meds: Cefepime 2grams every 8 hours, no PRNs today.  Problem list: Sepsis due to complicated urinary tract infection, in the setting of chronic foley cathter (present on admission). Today urine in the foley bag is more clear at the time of my examination.    Follow with cystoscopy results.  Procedure(s): 1.  Transurethral resection of prostate 2.  Cystoscopy with extraction of bladder calculus finasteride 5 mg daily to help decrease the risk of future hematuria secondary to his prostate.   2. Acute blood loss anemia due to hematuria/ prostate source / iron deficiency.     3. Quadriplegia, swallow dysfunction. Stage 3 vertebral column pressure ulcer. Present on admission.  Continue to be very weak and deconditioned, follows with hospice as outpatient. Aspiration precautions.  Continue with local skin pressure ulcer care, continue  care per protocol.    4. Hypokalemia.  Renal function with serum cr at 0,41 with K at 3,4 and serum bicarbonate at 27. Add 40 meq x2 doses and follow up renal function in am. Off IV fluids, he has been tolerating well tube feedings.  Patient continue to be at high risk for worsening anemia    Discharge Planning: back to [facility] with hospice services when stable to discharge Family Contact: talked with daughter  by phone IDT: Updated Goals of Care: per daughter to treat what is treatable and then for him to return to facility.    Should patient need ambulance transport at discharge please use [facility] as they contract this service for our active hospice patients.    A Please do not hesitate to call with questions.    Thank you,   Shannon Saas, RN, [facility]      [facility] Liaison   [phone]

## 2021-03-07 NOTE — Plan of Care (Signed)
  Problem: Clinical Measurements: Goal: Respiratory complications will improve Outcome: Completed/Met

## 2021-03-07 NOTE — Progress Notes (Addendum)
PROGRESS NOTE    Shannon [name]  IZT:[id number] DOB: 21-Aug-1945 DOA: 03/02/2021 PCP: [name], MD   Chief Complaint  Patient presents with   Hematuria    Brief Narrative:  Shannon [name] was admitted to the hospital with the working diagnosis of sepsis due to urinary tract infection, complicated with hematuria and acute blood loss.   75 year old male past medical history for quadriplegia, Chiari malformation type I, hypertension, osteoarthritis, chronic indwelling Foley catheter, asthma and stage IV sacral decubitus ulcer who was transferred from the skilled nursing facility to the hospital due to significant hematuria.  Patient has been under hospice.  Apparently he had dark red urine with clots, unable to irrigate Foley catheter.  On his initial physical examination his temperature was 98.6, blood pressure 137/93, heart rate 127, respiratory 22, oxygen saturation 95% he had dry mucous membranes, his lungs were clear to auscultation bilaterally, heart S1-S2, present, tachycardic, abdomen was soft, no lower extremity edema.  Patient had contractures in his upper and lower extremities, quadriplegia, noncommunicative.  Aphasic.   Sodium 147, potassium 4.3, chloride 109, bicarb 29, glucose 131, BUN 27, creatinine 0.45, white count 13.3, hemoglobin 8.0, hematocrit 28.8, platelets 274. SARS COVID-19 negative.   Urinalysis specific gravity <1.005,, > 300 protein,, > 50 white cells, > 50 red cells.   Patient was placed on intravenous antibiotic therapy, urology was consulted and patient was placed on continuous bladder irrigation.   Worsening anemia with Hgb down to 5,8 and hct 20,0 on 03/04/21.  Ordered 2 units PRBC.    09/12 follow up Hgb is 7,1 and continue to have hematuria, transfuse one more unit PRBC.   Status post cystoscopy per urology 03/06/2021 with TURP and calculus extraction.     Assessment & Plan:   Principal Problem:   Sepsis secondary to UTI [facility]) Active Problems:    Quadriplegia and quadriparesis (HCC)   Acute blood loss anemia   Chronic indwelling Foley catheter   Stage IV pressure ulcer of sacral region (HCC)  #1. sepsis secondary to complicated UTI in the setting of chronic Foley catheter, POA -Patient status post TURP with calculus extraction per urology 03/06/2021. -Patient underwent CBI. -Hematuria clearing Foley bag which is more clear at the time of examination. -Patient hemodynamically stable continue home regimen of midodrine, pregabalin, scopolamine, oxycodone, tamsulosin. -WBC fluctuating currently at 12.1. -Continue empiric IV cefepime as patient noted to have a prior history of Pseudomonas in urine culture from 10/2020. -Urology following.  2.  Acute blood loss anemia secondary to hematuria/prostate source/iron deficiency anemia -Status post transfusion 3 units packed red blood cells during the hospitalization. -Hemoglobin currently at 8.0 this morning. -Patient noted to have a melanotic stool per RN later on this afternoon FOBT ordered. -Follow H&H. -Transfusion threshold hemoglobin < 7.  3.  Quadriplegia/swallow dysfunction/stage III sacral decubitus ulcer, POA -Deconditioned and weak being followed by hospice in the outpatient setting. -Continue current tube feeds. -Continue current wound care as recommended by wound care nurse.  4.  Hypokalemia -Repleted.  5.  Melena -Per RN patient with some melanotic stools this afternoon. -Check FOBT. -Increase PPI to twice daily. -Follow H&H. -If ongoing melanotic stools will need GI input. -Transfusion threshold hemoglobin < 7.   DVT prophylaxis: SCDs Code Status: Full Family Communication: Updated patient and niece at bedside Disposition:   Status is: Inpatient  Remains inpatient appropriate because:IV treatments appropriate due to intensity of illness or inability to take PO  Dispo: The patient is from: SNF  Anticipated d/c is to: SNF              Patient  currently is not medically stable to d/c.   Difficult to place patient No       Consultants:  Urology: Dr. Benancio Deeds 03/02/2021 WOC RN  Procedures: TURP/cystoscopy with extraction of bladder calculus per Dr. Alvester Morin 03/06/2021 Transfusion 3 units packed red blood cells  Antimicrobials:  IV cefepime 03/02/2021>>>>> 03/09/2021   Subjective: Laying in bed.  Denies any chest pain.  No shortness of breath.  No abdominal pain.  Tolerating tube feeds.  Niece at bedside.  Foley catheter with clear urine.  Objective: Vitals:   03/06/21 2145 03/06/21 2206 03/07/21 0500 03/07/21 0800  BP: 126/71 122/72 118/84 137/60  Pulse: 93 84 87 (!) 102  Resp: Temp: 98.3 F (36.8 C) 98.5 F (36.9 C) 98.9 F (37.2 C) 98.3 F (36.8 C)  TempSrc:  Oral Oral Oral  SpO2: 97% 97% 98% 98%  Weight:      Height:        Intake/Output Summary (Last 24 hours) at 03/07/2021 1232 Last data filed at 03/07/2021 0900 Gross per 24 hour  Intake 1100 ml  Output 4000 ml  Net -2900 ml   Filed Weights   03/02/21 2107 03/02/21 2300  Weight: 79.6 kg 80 kg    Examination:  General exam: Appears calm and comfortable  Respiratory system: Clear to auscultation anterior lung fields.Marland Kitchen Respiratory effort normal. Cardiovascular system: S1 & S2 heard, RRR. No JVD, murmurs, rubs, gallops or clicks. No pedal edema. Gastrointestinal system: Abdomen is nondistended, soft and nontender. No organomegaly or masses felt. Normal bowel sounds heard.  PEG tube intact. Central nervous system: Alert and oriented.  Quadriplegic.  Extremities: Symmetric 5 x 5 power. Skin: No rashes, lesions or ulcers Psychiatry: Judgement and insight appear normal. Mood & affect appropriate.     Data Reviewed: I have personally reviewed following labs and imaging studies  CBC: Recent Labs  Lab 03/02/21 1440 03/03/21 0038 03/04/21 0407 03/05/21 0700 03/06/21 0326 03/06/21 2311 03/07/21 0015  WBC 13.3* 11.8* 9.2 10.1 9.8  --  12.1*   NEUTROABS 8.8*  --   --   --   --   --   --   HGB 8.0* 7.0* 5.8* 7.3* 7.7* 8.2* 8.0*  HCT 28.8* 24.8* 20.0* 24.0* 25.3* 27.6* 27.0*  MCV 79.3* 78.5* 77.8* 80.5 80.1  --  80.4  PLT 274 268 220 213 186  --  201    Basic Metabolic Panel: Recent Labs  Lab 03/03/21 0038 03/04/21 0407 03/05/21 0700 03/06/21 0326 03/07/21 0015  NA 141 140 142 136 135  K 3.9 3.4* 3.5 3.4* 4.1  CL 105 106 108 106 102  CO2 GLUCOSE 114* 108* 124* 138* 110*  BUN 23 23 31* 28* 21  CREATININE 0.56* 0.54* 0.46* 0.41* 0.33*  CALCIUM 8.9 8.4* 8.4* 8.2* 8.3*    GFR: Estimated Creatinine Clearance: 86.3 mL/min (A) (by C-G formula based on SCr of 0.33 mg/dL (L)).  Liver Function Tests: Recent Labs  Lab 03/03/21 0038  AST 27  ALT 18  ALKPHOS 85  BILITOT 0.9  PROT 8.9*  ALBUMIN 1.9*    CBG: No results for input(s): GLUCAP in the last 168 hours.   Recent Results (from the past 240 hour(s))  Resp Panel by RT-PCR (Flu A&B, Covid) Nasopharyngeal Swab     Status: None   Collection Time: 03/02/21  6:58 PM   Specimen: Nasopharyngeal Swab; Nasopharyngeal(NP) swabs in vial transport medium  Result Value Ref Range Status   SARS Coronavirus 2 by RT PCR NEGATIVE NEGATIVE Final    Comment: (NOTE) SARS-CoV-2 target nucleic acids are NOT DETECTED.  The SARS-CoV-2 RNA is generally detectable in upper respiratory specimens during the acute phase of infection. The lowest concentration of SARS-CoV-2 viral copies this assay can detect is 138 copies/mL. A negative result does not preclude SARS-Cov-2 infection and should not be used as the sole basis for treatment or other patient management decisions. A negative result may occur with  improper specimen collection/handling, submission of specimen other than nasopharyngeal swab, presence of viral mutation(s) within the areas targeted by this assay, and inadequate number of viral copies(<138 copies/mL). A negative result must be combined  with clinical observations, patient history, and epidemiological information. The expected result is Negative.  Fact Sheet for Patients:  BloggerCourse.com  Fact Sheet for Healthcare Providers:  SeriousBroker.it  This test is no t yet approved or cleared by the [place] FDA and  has been authorized for detection and/or diagnosis of SARS-CoV-2 by FDA under an Emergency Use Authorization (EUA). This EUA will remain  in effect (meaning this test can be used) for the duration of the COVID-19 declaration under Section 564(b)(1) of the Act, 21 U.S.C.section 360bbb-3(b)(1), unless the authorization is terminated  or revoked sooner.       Influenza A by PCR NEGATIVE NEGATIVE Final   Influenza B by PCR NEGATIVE NEGATIVE Final    Comment: (NOTE) The Xpert Xpress SARS-CoV-2/FLU/RSV plus assay is intended as an aid in the diagnosis of influenza from Nasopharyngeal swab specimens and should not be used as a sole basis for treatment. Nasal washings and aspirates are unacceptable for Xpert Xpress SARS-CoV-2/FLU/RSV testing.  Fact Sheet for Patients: BloggerCourse.com  Fact Sheet for Healthcare Providers: SeriousBroker.it  This test is not yet approved or cleared by the [place] FDA and has been authorized for detection and/or diagnosis of SARS-CoV-2 by FDA under an Emergency Use Authorization (EUA). This EUA will remain in effect (meaning this test can be used) for the duration of the COVID-19 declaration under Section 564(b)(1) of the Act, 21 U.S.C. section 360bbb-3(b)(1), unless the authorization is terminated or revoked.  Performed at [facility], [address] [place], [place] [address]   Culture, blood (routine x 2)     Status: Abnormal   Collection Time: 03/02/21 10:30 PM   Specimen: BLOOD  Result Value Ref Range Status   Specimen Description    Final    BLOOD BLOOD LEFT HAND Performed at [facility], [address], [place], [place] [address]    Special Requests   Final    BOTTLES DRAWN AEROBIC AND ANAEROBIC Blood Culture adequate volume Performed at [facility], [address], [place], [place] [address]    Culture  Setup Time   Final    GRAM POSITIVE COCCI IN CLUSTERS ANAEROBIC BOTTLE ONLY Organism ID to follow CRITICAL RESULT CALLED TO, READ BACK BY AND VERIFIED WITH: A PHAM PHARMD [id number] 03/05/21 A BROWNING    Culture (A)  Final    STAPHYLOCOCCUS COHNII THE SIGNIFICANCE OF ISOLATING THIS ORGANISM FROM A SINGLE SET OF BLOOD CULTURES WHEN MULTIPLE SETS ARE DRAWN IS UNCERTAIN. PLEASE NOTIFY THE MICROBIOLOGY DEPARTMENT WITHIN ONE WEEK IF SPECIATION AND SENSITIVITIES ARE REQUIRED. Performed at [facility], [address] [place], [place] [address]    Report Status  03/07/2021 FINAL  Final  Blood Culture ID Panel (Reflexed)     Status: Abnormal   Collection Time: 03/02/21 10:30 PM  Result Value Ref Range Status   Enterococcus faecalis NOT DETECTED NOT DETECTED Final   Enterococcus Faecium NOT DETECTED NOT DETECTED Final   Listeria monocytogenes NOT DETECTED NOT DETECTED Final   Staphylococcus species DETECTED (A) NOT DETECTED Final    Comment: CRITICAL RESULT CALLED TO, READ BACK BY AND VERIFIED WITH: A PHAM PHARMD [id number] 03/05/21 A BROWNING    Staphylococcus aureus (BCID) NOT DETECTED NOT DETECTED Final   Staphylococcus epidermidis NOT DETECTED NOT DETECTED Final   Staphylococcus lugdunensis NOT DETECTED NOT DETECTED Final   Streptococcus species NOT DETECTED NOT DETECTED Final   Streptococcus agalactiae NOT DETECTED NOT DETECTED Final   Streptococcus pneumoniae NOT DETECTED NOT DETECTED Final   Streptococcus pyogenes NOT DETECTED NOT DETECTED Final   A.calcoaceticus-baumannii NOT DETECTED NOT DETECTED Final   Bacteroides fragilis NOT DETECTED NOT DETECTED Final    Enterobacterales NOT DETECTED NOT DETECTED Final   Enterobacter cloacae complex NOT DETECTED NOT DETECTED Final   Escherichia coli NOT DETECTED NOT DETECTED Final   Klebsiella aerogenes NOT DETECTED NOT DETECTED Final   Klebsiella oxytoca NOT DETECTED NOT DETECTED Final   Klebsiella pneumoniae NOT DETECTED NOT DETECTED Final   Proteus species NOT DETECTED NOT DETECTED Final   Salmonella species NOT DETECTED NOT DETECTED Final   Serratia marcescens NOT DETECTED NOT DETECTED Final   Haemophilus influenzae NOT DETECTED NOT DETECTED Final   Neisseria meningitidis NOT DETECTED NOT DETECTED Final   Pseudomonas aeruginosa NOT DETECTED NOT DETECTED Final   Stenotrophomonas maltophilia NOT DETECTED NOT DETECTED Final   Candida albicans NOT DETECTED NOT DETECTED Final   Candida auris NOT DETECTED NOT DETECTED Final   Candida glabrata NOT DETECTED NOT DETECTED Final   Candida krusei NOT DETECTED NOT DETECTED Final   Candida parapsilosis NOT DETECTED NOT DETECTED Final   Candida tropicalis NOT DETECTED NOT DETECTED Final   Cryptococcus neoformans/gattii NOT DETECTED NOT DETECTED Final    Comment: Performed at [facility], [address], [place], [place] [address]  Culture, blood (routine x 2)     Status: None (Preliminary result)   Collection Time: 03/03/21 12:38 AM   Specimen: BLOOD  Result Value Ref Range Status   Specimen Description   Final    BLOOD RIGHT WRIST Performed at [facility], [address], [place], [place] [address]    Special Requests   Final    BOTTLES DRAWN AEROBIC ONLY Blood Culture adequate volume Performed at [facility], [address], [place], [place] [address]    Culture   Final    NO GROWTH 4 DAYS Performed at [facility], [address] [place], [place] [address]    Report Status PENDING  Incomplete  Surgical PCR screen     Status: Abnormal   Collection Time: 03/06/21  2:23 PM   Specimen: Nasal Mucosa;  Nasal Swab  Result Value Ref Range Status   MRSA, PCR POSITIVE (A) NEGATIVE Final    Comment: RESULT CALLED TO, READ BACK BY AND VERIFIED WITH: RN Shela Commons [facility] AT 5053 03/07/21 CRUICKSHANK A    Staphylococcus aureus POSITIVE (A) NEGATIVE Final    Comment: (NOTE) The Xpert SA Assay (FDA approved for NASAL specimens in patients 85 years of age and older), is one component of a comprehensive surveillance program. It is not intended to diagnose infection nor to guide  or monitor treatment. Performed at [facility], [address] [place], [place] [address]          Radiology Studies: No results found.      Scheduled Meds:  sodium chloride   Intravenous Once   albuterol  2.5 mg Inhalation BID   ascorbic acid  1,000 mg Per Tube Daily   Chlorhexidine Gluconate Cloth  6 each Topical Daily   docusate  100 mg Per Tube BID   DULoxetine  30 mg Oral Daily   ferrous sulfate  300 mg Per Tube Q1200   finasteride  5 mg Oral Daily   free water  100 mL Per Tube Q4H   guaiFENesin  100 mg Per Tube BID   lactobacillus  1 g Per Tube Daily   midodrine  10 mg Per Tube TID PC   mupirocin ointment  1 application Nasal BID   pantoprazole sodium  40 mg Per Tube Daily   pregabalin  100 mg Per Tube BID   scopolamine  1 patch Transdermal Q72H   tamsulosin  0.4 mg Oral q1800   zinc sulfate  220 mg Per Tube Daily   Continuous Infusions:  ceFEPime (MAXIPIME) IV 2 g (03/07/21 1200)   feeding supplement (PIVOT 1.5 CAL) 1,000 mL (03/06/21 2354)   sodium chloride irrigation       LOS: 5 days    Time spent: 35 minutes    Ramiro Harvest, MD [facility]   To contact the attending provider between 7A-7P or the covering provider during after hours 7P-7A, please log into the web site [email or web address] and access using universal [place] password for that web site. If you do not have the password, please call the hospital operator.  03/07/2021, 12:32 PM

## 2021-03-08 LAB — TYPE AND SCREEN
ABO/RH(D): A POS
Antibody Screen: NEGATIVE
Unit division: 0
Unit division: 0
Unit division: 0
Unit division: 0
Unit division: 0

## 2021-03-08 LAB — CBC WITH DIFFERENTIAL/PLATELET
Abs Immature Granulocytes: 0.07 10*3/uL (ref 0.00–0.07)
Basophils Absolute: 0 10*3/uL (ref 0.0–0.1)
Basophils Relative: 0 %
Eosinophils Absolute: 0.5 10*3/uL (ref 0.0–0.5)
Eosinophils Relative: 6 %
HCT: 24.4 % — ABNORMAL LOW (ref 39.0–52.0)
Hemoglobin: 7.3 g/dL — ABNORMAL LOW (ref 13.0–17.0)
Immature Granulocytes: 1 %
Lymphocytes Relative: 20 %
Lymphs Abs: 1.9 10*3/uL (ref 0.7–4.0)
MCH: 24 pg — ABNORMAL LOW (ref 26.0–34.0)
MCHC: 29.9 g/dL — ABNORMAL LOW (ref 30.0–36.0)
MCV: 80.3 fL (ref 80.0–100.0)
Monocytes Absolute: 1 10*3/uL (ref 0.1–1.0)
Monocytes Relative: 11 %
Neutro Abs: 5.9 10*3/uL (ref 1.7–7.7)
Neutrophils Relative %: 62 %
Platelets: 196 10*3/uL (ref 150–400)
RBC: 3.04 MIL/uL — ABNORMAL LOW (ref 4.22–5.81)
RDW: 19.2 % — ABNORMAL HIGH (ref 11.5–15.5)
WBC: 9.4 10*3/uL (ref 4.0–10.5)
nRBC: 0 % (ref 0.0–0.2)

## 2021-03-08 LAB — BPAM RBC
Blood Product Expiration Date: 202210092359
Blood Product Expiration Date: 202210092359
Blood Product Expiration Date: 202210102359
Blood Product Expiration Date: 202210102359
Blood Product Expiration Date: 202210102359
ISSUE DATE / TIME: 202209111141
ISSUE DATE / TIME: 202209111535
ISSUE DATE / TIME: 202209121622
Unit Type and Rh: 6200
Unit Type and Rh: 6200
Unit Type and Rh: 6200
Unit Type and Rh: 6200
Unit Type and Rh: 6200

## 2021-03-08 LAB — VITAMIN B12: Vitamin B-12: 431 pg/mL (ref 180–914)

## 2021-03-08 LAB — BASIC METABOLIC PANEL
Anion gap: 5 (ref 5–15)
BUN: 23 mg/dL (ref 8–23)
CO2: 28 mmol/L (ref 22–32)
Calcium: 8.3 mg/dL — ABNORMAL LOW (ref 8.9–10.3)
Chloride: 104 mmol/L (ref 98–111)
Creatinine, Ser: 0.45 mg/dL — ABNORMAL LOW (ref 0.61–1.24)
GFR, Estimated: >60 mL/min (ref >60)
Glucose, Bld: 127 mg/dL — ABNORMAL HIGH (ref 70–99)
Potassium: 3.9 mmol/L (ref 3.5–5.1)
Sodium: 137 mmol/L (ref 135–145)

## 2021-03-08 LAB — CULTURE, BLOOD (ROUTINE X 2)
Culture: NO GROWTH
Special Requests: ADEQUATE

## 2021-03-08 LAB — SURGICAL PATHOLOGY

## 2021-03-08 LAB — FOLATE: Folate: 10.6 ng/mL (ref >5.9)

## 2021-03-08 LAB — HEMOGLOBIN AND HEMATOCRIT, BLOOD
HCT: 24.4 % — ABNORMAL LOW (ref 39.0–52.0)
Hemoglobin: 7.2 g/dL — ABNORMAL LOW (ref 13.0–17.0)

## 2021-03-08 LAB — IRON AND TIBC
Iron: 15 ug/dL — ABNORMAL LOW (ref 45–182)
Saturation Ratios: 7 % — ABNORMAL LOW (ref 17.9–39.5)
TIBC: 202 ug/dL — ABNORMAL LOW (ref 250–450)
UIBC: 187 ug/dL

## 2021-03-08 LAB — FERRITIN: Ferritin: 33 ng/mL (ref 24–336)

## 2021-03-08 MED ORDER — PANTOPRAZOLE SODIUM 40 MG PO PACK: 40.0000 mg | PACK | Freq: Two times a day (BID) | ORAL | Status: DC

## 2021-03-08 MED ORDER — PANTOPRAZOLE 2 MG/ML SUSPENSION: 40.0000 mg | Freq: Two times a day (BID) | ORAL | Status: DC

## 2021-03-08 MED ORDER — SODIUM CHLORIDE 0.9 % IV SOLN: 510.0000 mg | Freq: Once | INTRAVENOUS | Status: DC

## 2021-03-08 MED ORDER — PANTOPRAZOLE 2 MG/ML SUSPENSION
40.0000 mg | Freq: Two times a day (BID) | ORAL | Status: DC
  Administered 2021-03-08 – 2021-03-10 (×4): 40 mg
  Filled 2021-03-08 (×6): qty 20

## 2021-03-08 MED ORDER — SODIUM CHLORIDE 0.9 % IV SOLN
250.0000 mg | Freq: Once | INTRAVENOUS | Status: AC
  Administered 2021-03-08: 250 mg via INTRAVENOUS
  Filled 2021-03-08: qty 20

## 2021-03-08 NOTE — Progress Notes (Signed)
Shannon [name] [address] [facility]) hospitalized hospice patient visit   Shannon [name] is a current hospice patient with a terminal diagnosis of severe protein calorie malnutrition who resides at [facility]. Facility sent patient to the ED for bleeding from meatus, Hospice was notified only after patient was transferred. Shannon [name] was admitted to [facility] on 9.9.2022 with a diagnosis of Sepsis related to UTI. Per Dr. Barbee Shropshire with [facility] this is a related hospital admission.    Visited at bedside. Patient was sleeping comfortably, easily awakened by voice. Stated he was hurting in his left lower leg. Continues to receive tube feed and IV antibiotics.  Cystoscopy was completed yesterday. Patient has had some black tarry stools and was positive for occult blood. Unsure at this time if treatment is an option but patient stated he would like to do what is possible in his condition.    Patient is inpatient appropriate due to need for  IVAB, and follow up evaluation  from procedure.   VS: 98.4, 101/76, 84, 18, 98%ra I/O: 600/450   Abnormal Labs:  03/07/2021 00:15 Glucose: 127(H) Creatinine: 0.45 (L) Calcium: 8.3 (L) RBC: 3.04(L) Hemoglobin: 7.2(L) HCT: 24.4(L) MCH: 23.8 (L) MCHC: 29.9 (L) RDW: 19.2 (H)   IV/PRN Meds: Cefepime 2grams every 8 hours. PRN: [id number] oxyCODONE-acetaminophen (PERCOCET/ROXICET) 5-325 MG per tablet 1 tablet Dose: 1 tablet   Problem list: #1. sepsis secondary to complicated UTI in the setting of chronic Foley catheter, POA -Patient status post TURP with calculus extraction per urology 03/06/2021. -Patient underwent CBI. -Hematuria clearing Foley bag which is more clear at the time of examination. -Patient hemodynamically stable continue home regimen of midodrine, pregabalin, scopolamine, oxycodone, tamsulosin. -WBC fluctuating currently at 12.1. -Continue empiric IV cefepime as patient noted to have a prior history of Pseudomonas in urine culture  from 10/2020. -Urology following.  2.  Acute blood loss anemia secondary to hematuria/prostate source/iron deficiency anemia -Status post transfusion 3 units packed red blood cells during the hospitalization. -Hemoglobin currently at 8.0 this morning. -Patient noted to have a melanotic stool per RN later on this afternoon FOBT ordered. -Follow H&H. -Transfusion threshold hemoglobin < 7.  3.  Quadriplegia/swallow dysfunction/stage III sacral decubitus ulcer, POA -Deconditioned and weak being followed by hospice in the outpatient setting. -Continue current tube feeds. -Continue current wound care as recommended by wound care nurse.  4.  Hypokalemia -Repleted.  5.  Melena -Per RN patient with some melanotic stools this afternoon. -Check FOBT. -Increase PPI to twice daily. -Follow H&H. -If ongoing melanotic stools will need GI input. -Transfusion threshold hemoglobin < 7.     Discharge Planning: back to [facility] with hospice services when stable to discharge Family Contact: talked with daughter  by phone IDT: Updated Goals of Care: per daughter to treat what is treatable and then for him to return to facility.    Should patient need ambulance transport at discharge please use [facility] as they contract this service for our active hospice patients.    A Please do not hesitate to call with questions.     Thank you,   Yolande Jolly, BSN, RN  [facility] Liaison   [phone]

## 2021-03-08 NOTE — Progress Notes (Signed)
PROGRESS NOTE    Shannon [name]  ASN:[id number] DOB: Dec 03, 1945 DOA: 03/02/2021 PCP: [name], MD   Chief Complaint  Patient presents with   Hematuria    Brief Narrative:  [name] was admitted to the hospital with the working diagnosis of sepsis due to urinary tract infection, complicated with hematuria and acute blood loss.   75 year old male past medical history for quadriplegia, Chiari malformation type I, hypertension, osteoarthritis, chronic indwelling Foley catheter, asthma and stage IV sacral decubitus ulcer who was transferred from the skilled nursing facility to the hospital due to significant hematuria.  Patient has been under hospice.  Apparently he had dark red urine with clots, unable to irrigate Foley catheter.  On his initial physical examination his temperature was 98.6, blood pressure 137/93, heart rate 127, respiratory 22, oxygen saturation 95% he had dry mucous membranes, his lungs were clear to auscultation bilaterally, heart S1-S2, present, tachycardic, abdomen was soft, no lower extremity edema.  Patient had contractures in his upper and lower extremities, quadriplegia, noncommunicative.  Aphasic.   Sodium 147, potassium 4.3, chloride 109, bicarb 29, glucose 131, BUN 27, creatinine 0.45, white count 13.3, hemoglobin 8.0, hematocrit 28.8, platelets 274. SARS COVID-19 negative.   Urinalysis specific gravity <1.005,, > 300 protein,, > 50 white cells, > 50 red cells.   Patient was placed on intravenous antibiotic therapy, urology was consulted and patient was placed on continuous bladder irrigation.   Worsening anemia with Hgb down to 5,8 and hct 20,0 on 03/04/21.  Ordered 2 units PRBC.    09/12 follow up Hgb is 7,1 and continue to have hematuria, transfuse one more unit PRBC.   Status post cystoscopy per urology 03/06/2021 with TURP and calculus extraction.     Assessment & Plan:   Principal Problem:   Sepsis secondary to UTI [facility]) Active Problems:    Quadriplegia and quadriparesis (HCC)   Acute blood loss anemia   Chronic indwelling Foley catheter   Stage IV pressure ulcer of sacral region (HCC)   Melena  #1. sepsis secondary to complicated UTI in the setting of chronic Foley catheter, POA -Patient status post TURP with calculus extraction per urology 03/06/2021. -Patient underwent CBI. -Hematuria clearing Foley bag which is more clear at the time of examination. -Patient hemodynamically stable continue home regimen of midodrine, pregabalin, scopolamine, oxycodone, tamsulosin. -WBC was fluctuating but trended down currently at 9.4.  -Continue empiric IV cefepime as patient noted to have a prior history of Pseudomonas in urine culture from 10/2020. -Urology following.  2.  Acute blood loss anemia secondary to hematuria/prostate source/iron deficiency anemia -Status post transfusion 3 units packed red blood cells during the hospitalization. -Hemoglobin currently at stable at 7.3 this morning. -Patient noted to have a melanotic stool per RN later on the afternoon of 03/07/2021.   -FOBT positive with no further melanotic stools noted.   -PPI increased to twice daily.   -Anemia panel ordered.  If iron deficient will need IV Feraheme.  -Follow H&H. -Transfusion threshold hemoglobin < 7.  3.  Quadriplegia/swallow dysfunction/stage III sacral decubitus ulcer, POA -Deconditioned and weak being followed by hospice in the outpatient setting. -Continue tube feeds.   -Continue current wound care as recommended per wound care nurse.   4.  Hypokalemia -Repleted.  5.  Melena -Per RN patient with some melanotic stools the afternoon of 03/07/2021.  -FOBT positive.   -No further melanotic stools noted today.   -Check an anemia panel.   -Follow H&H.   -Continue PPI  twice daily.  -If ongoing melanotic stools will need GI input. -Transfusion threshold hemoglobin < 7.   DVT prophylaxis: SCDs Code Status: Full Family Communication: Updated  patient.  No family at bedside. Disposition:   Status is: Inpatient  Remains inpatient appropriate because:IV treatments appropriate due to intensity of illness or inability to take PO  Dispo: The patient is from: SNF              Anticipated d/c is to: SNF with hospice following as before prior to admission.              Patient currently is not medically stable to d/c.   Difficult to place patient No       Consultants:  Urology: Dr. Benancio Deeds 03/02/2021 WOC RN  Procedures: TURP/cystoscopy with extraction of bladder calculus per Dr. Alvester Morin 03/06/2021 Transfusion 3 units packed red blood cells  Antimicrobials:  IV cefepime 03/02/2021>>>>> 03/09/2021   Subjective: Quadriplegic.  Laying in bed.  Denies any chest pain.  No shortness of breath.  No abdominal pain.  Feeling well.  Asking when he is going to be able to be discharged.  No noted melanotic stools today.    Objective: Vitals:   03/07/21 1200 03/07/21 2208 03/08/21 0605 03/08/21 0920  BP: 125/71 (!) 102/55 101/76   Pulse: 96 92 84   Resp: 18 16 18    Temp: 98.6 F (37 C) 98.8 F (37.1 C) 98.4 F (36.9 C)   TempSrc: Oral Axillary Axillary   SpO2: 98% 100% 100% 97%  Weight:      Height:        Intake/Output Summary (Last 24 hours) at 03/08/2021 1251 Last data filed at 03/08/2021 1213 Gross per 24 hour  Intake 2800 ml  Output 2050 ml  Net 750 ml    Filed Weights   03/02/21 2107 03/02/21 2300  Weight: 79.6 kg 80 kg    Examination:  General exam: NAD Respiratory system: CTA B.  No wheezes, no crackles, no rhonchi.  Normal respiratory effort.   Cardiovascular system: Regular rate and rhythm.  No murmurs rubs or gallops.  No JVD.  No lower extremity edema.  Gastrointestinal system: Abdomen is soft, nontender, nondistended, positive bowel sounds.  PEG tube in place.  Central nervous system: Alert and oriented.  Quadriplegic.  Extremities: Symmetric 5 x 5 power. Skin: No rashes, lesions or ulcers Psychiatry:  Judgement and insight appear normal. Mood & affect appropriate.     Data Reviewed: I have personally reviewed following labs and imaging studies  CBC: Recent Labs  Lab 03/02/21 1440 03/03/21 0038 03/04/21 0407 03/05/21 0700 03/06/21 0326 03/06/21 2311 03/07/21 0015 03/08/21 0335  WBC 13.3*   < > 9.2 10.1 9.8  --  12.1* 9.4  NEUTROABS 8.8*  --   --   --   --   --   --  5.9  HGB 8.0*   < > 5.8* 7.3* 7.7* 8.2* 8.0* 7.3*  HCT 28.8*   < > 20.0* 24.0* 25.3* 27.6* 27.0* 24.4*  MCV 79.3*   < > 77.8* 80.5 80.1  --  80.4 80.3  PLT 274   < > 220 213 186  --  201 196   < > = values in this interval not displayed.     Basic Metabolic Panel: Recent Labs  Lab 03/04/21 0407 03/05/21 0700 03/06/21 0326 03/07/21 0015 03/08/21 0335  NA 140 142 136 135 137  K 3.4* 3.5 3.4* 4.1 3.9  CL 106 108 106 102  104  CO2 28 27 27 26 28   GLUCOSE 108* 124* 138* 110* 127*  BUN 23 31* 28* 21 23  CREATININE 0.54* 0.46* 0.41* 0.33* 0.45*  CALCIUM 8.4* 8.4* 8.2* 8.3* 8.3*     GFR: Estimated Creatinine Clearance: 86.3 mL/min (A) (by C-G formula based on SCr of 0.45 mg/dL (L)).  Liver Function Tests: Recent Labs  Lab 03/03/21 0038  AST 27  ALT 18  ALKPHOS 85  BILITOT 0.9  PROT 8.9*  ALBUMIN 1.9*     CBG: No results for input(s): GLUCAP in the last 168 hours.   Recent Results (from the past 240 hour(s))  Resp Panel by RT-PCR (Flu A&B, Covid) Nasopharyngeal Swab     Status: None   Collection Time: 03/02/21  6:58 PM   Specimen: Nasopharyngeal Swab; Nasopharyngeal(NP) swabs in vial transport medium  Result Value Ref Range Status   SARS Coronavirus 2 by RT PCR NEGATIVE NEGATIVE Final    Comment: (NOTE) SARS-CoV-2 target nucleic acids are NOT DETECTED.  The SARS-CoV-2 RNA is generally detectable in upper respiratory specimens during the acute phase of infection. The lowest concentration of SARS-CoV-2 viral copies this assay can detect is 138 copies/mL. A negative result does not  preclude SARS-Cov-2 infection and should not be used as the sole basis for treatment or other patient management decisions. A negative result may occur with  improper specimen collection/handling, submission of specimen other than nasopharyngeal swab, presence of viral mutation(s) within the areas targeted by this assay, and inadequate number of viral copies(<138 copies/mL). A negative result must be combined with clinical observations, patient history, and epidemiological information. The expected result is Negative.  Fact Sheet for Patients:  05/02/21  Fact Sheet for Healthcare Providers:  BloggerCourse.com  This test is no t yet approved or cleared by the [employer] FDA and  has been authorized for detection and/or diagnosis of SARS-CoV-2 by FDA under an Emergency Use Authorization (EUA). This EUA will remain  in effect (meaning this test can be used) for the duration of the COVID-19 declaration under Section 564(b)(1) of the Act, 21 U.S.C.section 360bbb-3(b)(1), unless the authorization is terminated  or revoked sooner.       Influenza A by PCR NEGATIVE NEGATIVE Final   Influenza B by PCR NEGATIVE NEGATIVE Final    Comment: (NOTE) The Xpert Xpress SARS-CoV-2/FLU/RSV plus assay is intended as an aid in the diagnosis of influenza from Nasopharyngeal swab specimens and should not be used as a sole basis for treatment. Nasal washings and aspirates are unacceptable for Xpert Xpress SARS-CoV-2/FLU/RSV testing.  Fact Sheet for Patients: [place]  Fact Sheet for Healthcare Providers: BloggerCourse.com  This test is not yet approved or cleared by the [employer]ious[employer] FDA and has been authorized for detection and/or diagnosis of SARS-CoV-2 by FDA under an Emergency Use Authorization (EUA). This EUA will remain in effect (meaning this test can be used) for the  duration of the COVID-19 declaration under Section 564(b)(1) of the Act, 21 U.S.C. section 360bbb-3(b)(1), unless the authorization is terminated or revoked.  Performed at [facility], [address], [place], [place]   Culture, blood (routine x 2)     Status: Abnormal   Collection Time: 03/02/21 10:30 PM   Specimen: BLOOD  Result Value Ref Range Status   Specimen Description   Final    BLOOD BLOOD LEFT HAND Performed at [facility], [address] [place], [place]    Special Requests   Final  BOTTLES DRAWN AEROBIC AND ANAEROBIC Blood Culture adequate volume Performed at [facility], [address], [place], [place] [address]    Culture  Setup Time   Final    GRAM POSITIVE COCCI IN CLUSTERS ANAEROBIC BOTTLE ONLY Organism ID to follow CRITICAL RESULT CALLED TO, READ BACK BY AND VERIFIED WITH: A PHAM PHARMD [id number] 03/05/21 A BROWNING    Culture (A)  Final    STAPHYLOCOCCUS COHNII THE SIGNIFICANCE OF ISOLATING THIS ORGANISM FROM A SINGLE SET OF BLOOD CULTURES WHEN MULTIPLE SETS ARE DRAWN IS UNCERTAIN. PLEASE NOTIFY THE MICROBIOLOGY DEPARTMENT WITHIN ONE WEEK IF SPECIATION AND SENSITIVITIES ARE REQUIRED. Performed at [facility], [address] [place], [place] [address]    Report Status 03/07/2021 FINAL  Final  Blood Culture ID Panel (Reflexed)     Status: Abnormal   Collection Time: 03/02/21 10:30 PM  Result Value Ref Range Status   Enterococcus faecalis NOT DETECTED NOT DETECTED Final   Enterococcus Faecium NOT DETECTED NOT DETECTED Final   Listeria monocytogenes NOT DETECTED NOT DETECTED Final   Staphylococcus species DETECTED (A) NOT DETECTED Final    Comment: CRITICAL RESULT CALLED TO, READ BACK BY AND VERIFIED WITH: A PHAM PHARMD [id number] 03/05/21 A BROWNING    Staphylococcus aureus (BCID) NOT DETECTED NOT DETECTED Final   Staphylococcus epidermidis NOT DETECTED NOT DETECTED Final    Staphylococcus lugdunensis NOT DETECTED NOT DETECTED Final   Streptococcus species NOT DETECTED NOT DETECTED Final   Streptococcus agalactiae NOT DETECTED NOT DETECTED Final   Streptococcus pneumoniae NOT DETECTED NOT DETECTED Final   Streptococcus pyogenes NOT DETECTED NOT DETECTED Final   A.calcoaceticus-baumannii NOT DETECTED NOT DETECTED Final   Bacteroides fragilis NOT DETECTED NOT DETECTED Final   Enterobacterales NOT DETECTED NOT DETECTED Final   Enterobacter cloacae complex NOT DETECTED NOT DETECTED Final   Escherichia coli NOT DETECTED NOT DETECTED Final   Klebsiella aerogenes NOT DETECTED NOT DETECTED Final   Klebsiella oxytoca NOT DETECTED NOT DETECTED Final   Klebsiella pneumoniae NOT DETECTED NOT DETECTED Final   Proteus species NOT DETECTED NOT DETECTED Final   Salmonella species NOT DETECTED NOT DETECTED Final   Serratia marcescens NOT DETECTED NOT DETECTED Final   Haemophilus influenzae NOT DETECTED NOT DETECTED Final   Neisseria meningitidis NOT DETECTED NOT DETECTED Final   Pseudomonas aeruginosa NOT DETECTED NOT DETECTED Final   Stenotrophomonas maltophilia NOT DETECTED NOT DETECTED Final   Candida albicans NOT DETECTED NOT DETECTED Final   Candida auris NOT DETECTED NOT DETECTED Final   Candida glabrata NOT DETECTED NOT DETECTED Final   Candida krusei NOT DETECTED NOT DETECTED Final   Candida parapsilosis NOT DETECTED NOT DETECTED Final   Candida tropicalis NOT DETECTED NOT DETECTED Final   Cryptococcus neoformans/gattii NOT DETECTED NOT DETECTED Final    Comment: Performed at [facility], [address], [place], [place] [address]  Culture, blood (routine x 2)     Status: None   Collection Time: 03/03/21 12:38 AM   Specimen: BLOOD  Result Value Ref Range Status   Specimen Description   Final    BLOOD RIGHT WRIST Performed at [facility], [address], [place], [place] [address]    Special Requests   Final    BOTTLES DRAWN AEROBIC  ONLY Blood Culture adequate volume Performed at [facility], [address] [place], [place] [address]    Culture   Final    NO GROWTH 5 DAYS Performed at [facility], [address], [place], [place] [address]    Report Status 03/08/2021 FINAL  Final  Surgical PCR screen     Status: Abnormal   Collection Time: 03/06/21  2:23 PM   Specimen: Nasal Mucosa; Nasal Swab  Result Value Ref Range Status   MRSA, PCR POSITIVE (A) NEGATIVE Final    Comment: RESULT CALLED TO, READ BACK BY AND VERIFIED WITH: RN Shela [facility] AT 3235 03/07/21 CRUICKSHANK A    Staphylococcus aureus POSITIVE (A) NEGATIVE Final    Comment: (NOTE) The Xpert SA Assay (FDA approved for NASAL specimens in patients 39 years of age and older), is one component of a comprehensive surveillance program. It is not intended to diagnose infection nor to guide or monitor treatment. Performed at [facility], [address] [place], [place] [address]           Radiology Studies: No results found.      Scheduled Meds:  sodium chloride   Intravenous Once   albuterol  2.5 mg Inhalation BID   ascorbic acid  1,000 mg Per Tube Daily   Chlorhexidine Gluconate Cloth  6 each Topical Daily   docusate  100 mg Per Tube BID   DULoxetine  30 mg Oral Daily   ferrous sulfate  300 mg Per Tube Q1200   finasteride  5 mg Oral Daily   free water  100 mL Per Tube Q4H   guaiFENesin  100 mg Per Tube BID   lactobacillus  1 g Per Tube Daily   midodrine  10 mg Per Tube TID PC   mupirocin ointment  1 application Nasal BID   pantoprazole sodium  40 mg Per Tube BID   pregabalin  100 mg Per Tube BID   scopolamine  1 patch Transdermal Q72H   tamsulosin  0.4 mg Oral q1800   zinc sulfate  220 mg Per Tube Daily   Continuous Infusions:  ceFEPime (MAXIPIME) IV 2 g (03/08/21 1208)   feeding supplement (PIVOT 1.5 CAL) 1,000 mL (03/06/21 2354)   sodium chloride irrigation       LOS: 6 days     Time spent: 35 minutes    Ramiro Harvest, MD [facility]   To contact the attending provider between 7A-7P or the covering provider during after hours 7P-7A, please log into the web site [email or web address] and access using universal [place] password for that web site. If you do not have the password, please call the hospital operator.  03/08/2021, 12:51 PM

## 2021-03-09 DIAGNOSIS — A419 Sepsis, unspecified organism: Secondary | ICD-10-CM

## 2021-03-09 DIAGNOSIS — N390 Urinary tract infection, site not specified: Secondary | ICD-10-CM

## 2021-03-09 LAB — CBC WITH DIFFERENTIAL/PLATELET
Abs Immature Granulocytes: 0.05 10*3/uL (ref 0.00–0.07)
Basophils Absolute: 0 10*3/uL (ref 0.0–0.1)
Basophils Relative: 1 %
Eosinophils Absolute: 0.9 10*3/uL — ABNORMAL HIGH (ref 0.0–0.5)
Eosinophils Relative: 11 %
HCT: 24.8 % — ABNORMAL LOW (ref 39.0–52.0)
Hemoglobin: 7.5 g/dL — ABNORMAL LOW (ref 13.0–17.0)
Immature Granulocytes: 1 %
Lymphocytes Relative: 20 %
Lymphs Abs: 1.6 10*3/uL (ref 0.7–4.0)
MCH: 24 pg — ABNORMAL LOW (ref 26.0–34.0)
MCHC: 30.2 g/dL (ref 30.0–36.0)
MCV: 79.5 fL — ABNORMAL LOW (ref 80.0–100.0)
Monocytes Absolute: 0.9 10*3/uL (ref 0.1–1.0)
Monocytes Relative: 11 %
Neutro Abs: 4.7 10*3/uL (ref 1.7–7.7)
Neutrophils Relative %: 56 %
Platelets: 197 10*3/uL (ref 150–400)
RBC: 3.12 MIL/uL — ABNORMAL LOW (ref 4.22–5.81)
RDW: 19.6 % — ABNORMAL HIGH (ref 11.5–15.5)
WBC: 8.2 10*3/uL (ref 4.0–10.5)
nRBC: 0 % (ref 0.0–0.2)

## 2021-03-09 LAB — BASIC METABOLIC PANEL
Anion gap: 4 — ABNORMAL LOW (ref 5–15)
BUN: 24 mg/dL — ABNORMAL HIGH (ref 8–23)
CO2: 29 mmol/L (ref 22–32)
Calcium: 8.1 mg/dL — ABNORMAL LOW (ref 8.9–10.3)
Chloride: 102 mmol/L (ref 98–111)
Creatinine, Ser: 0.46 mg/dL — ABNORMAL LOW (ref 0.61–1.24)
GFR, Estimated: >60 mL/min (ref >60)
Glucose, Bld: 130 mg/dL — ABNORMAL HIGH (ref 70–99)
Potassium: 3.9 mmol/L (ref 3.5–5.1)
Sodium: 135 mmol/L (ref 135–145)

## 2021-03-09 LAB — SARS CORONAVIRUS 2 (TAT 6-24 HRS): SARS Coronavirus 2: NEGATIVE

## 2021-03-09 MED ORDER — SODIUM CHLORIDE 0.9 % IV SOLN
250.0000 mg | Freq: Once | INTRAVENOUS | Status: AC
  Administered 2021-03-09: 250 mg via INTRAVENOUS
  Filled 2021-03-09: qty 20

## 2021-03-09 MED ORDER — LEVALBUTEROL HCL 0.63 MG/3ML IN NEBU: 0.6300 mg | INHALATION_SOLUTION | Freq: Four times a day (QID) | RESPIRATORY_TRACT | Status: DC | PRN

## 2021-03-09 NOTE — Progress Notes (Signed)
Nutrition Follow-up  INTERVENTION:   -Pivot 1.5 @ 60 ml/hr with 100 ml free water every 4 hours which will provide 2160 kcal, 135 grams protein, and 1680 ml free water.   **Patient can resume Osmolite 1.5 @ 55 ml/hr once Juven is back in stock and/or when he discharges. Pivot 1.5 is not needed for patient for any reason other than Juven being unavailable.    NUTRITION DIAGNOSIS:   Increased nutrient needs related to acute illness, wound healing as evidenced by estimated needs.  Ongoing.  GOAL:   Patient will meet greater than or equal to 90% of their needs  Meeting with TF  MONITOR:   TF tolerance, Labs, Weight trends, Skin  ASSESSMENT:   75 y.o. male with medical history of quadriplegia, Chiari malformation type I, HTN, osteoarthritis, stage IV sacral decubitus ulcer, chronic indwelling catheter, asthma, and previous COVID-19 infection. He presented to the ED from SNF d/t significant hematuria. Patient is non-verbal.  9/13 s/p cystoscopy, Transurethral resection of prostate  Patient continues TF of Pivot 1.5 @ 60 ml/hr via PEG. Pt can resume home TF of Osmolite 1.5 if discharges.   Admission weight: 176 lbs. No other weights for this admission.  Medications: Vitamin C, Colace, Ferrous sulfate, Floranex, Zinc sulfate  Labs reviewed.  Diet Order:   Diet Order             Diet NPO time specified  Diet effective midnight                   EDUCATION NEEDS:   No education needs have been identified at this time  Skin:  Skin Assessment: Skin Integrity Issues: Skin Integrity Issues:: DTI, Stage III DTI: L heel; toe on L foot Stage III: medical vertebral column  Last BM:  9/14 -type 6  Height:   Ht Readings from Last 1 Encounters:  03/02/21 5\' 11"  (1.803 m)    Weight:   Wt Readings from Last 1 Encounters:  03/02/21 80 kg    Ideal Body Weight:  78.2 kg  BMI:  Body mass index is 24.6 kg/m.  Estimated Nutritional Needs:   Kcal:  2100-2300  kcal  Protein:  115-130 grams  Fluid:  >/= 2.3 L/day  05/02/21, MS, RD, LDN Inpatient Clinical Dietitian Contact information available via Amion

## 2021-03-09 NOTE — Progress Notes (Signed)
Shannon [name] [address] [facility]) Hospital Liaison RN note  Shannon [name] is a current hospice patient with a terminal diagnosis of severe protein calorie malnutrition who resides at [facility]. Facility sent patient to the ED for bleeding from meatus, Hospice was notified only after patient was transferred. Shannon [name] was admitted to [facility] on 9.9.2022 with a diagnosis of Sepsis related to UTI. Per Dr. Barbee Shropshire with [facility] this is a related hospital admission.  Visited with patient at bedside. Patient was resting with eyes closed, easily aroused when name spoken softly. Stated he was having abdominal and leg pain "5-8" and requesting something for pain. Notified bedside RN Iris.  Patient remains inpatient appropriate due to need for IVAB, follow up evaluation of procedure and continued evaluation of lab work following H/H.  V/S: 99.5 Ax, 109/77, 97, 16, 94% on room air  I/O: 600/2350  Abnormal Labs: Glucose Latest Ref Range: 70 - 99 mg/dL 761 (H)  BUN Latest Ref Range: 8 - 23 mg/dL 24 (H)  Creatinine Latest Ref Range: 0.61 - 1.24 mg/dL 6.07 (L)  Calcium Latest Ref Range: 8.9 - 10.3 mg/dL 8.1 (L)  Anion gap Latest Ref Range: 5 - 15  4 (L)  RBC Latest Ref Range: 4.22 - 5.81 MIL/uL 3.12 (L)  Hemoglobin Latest Ref Range: 13.0 - 17.0 g/dL 7.5 (L)  HCT Latest Ref Range: 39.0 - 52.0 % 24.8 (L)  MCV Latest Ref Range: 80.0 - 100.0 fL 79.5 (L)  MCH Latest Ref Range: 26.0 - 34.0 pg 24.0 (L)  RDW Latest Ref Range: 11.5 - 15.5 % 19.6 (H)  Eosinophils Absolute Latest Ref Range: 0.0 - 0.5 K/uL 0.9 (H)   IV/PRN: cefipime 2G IV Q 8 hours, ferumoxytol 510 mg IV X 1, oxycodone 5-325 mg, 1Tablet X 2  Problem List: Assessment & Plan:   Principal Problem:   Sepsis secondary to UTI [facility]) Active Problems:   Quadriplegia and quadriparesis (HCC)   Acute blood loss anemia   Chronic indwelling Foley catheter   Stage IV pressure ulcer of sacral region (HCC)   Melena   1-Sepsis  secondary to complicated UTI in the setting of chronic Foley catheter, POA -Patient is status post TURP with calculus extraction.  Urology 03/06/2021 -Patient received CBI. -Hematuria improving. -Continue empirically cefepime -Urology following   2-Acute blood loss anemia secondary to hematuria/prostate source/iron deficiency anemia: -Patient has received 3 unit of packed red blood cell -Hbstable 7.5. -FOBT positive, continue to monitor for melanotic stool.  No bowel movement in the last 24 hours -Received IV iron 9/15.  Plan to repeat IV iron today. -If hemoglobin continues to be stable tomorrow he can be transferred to SNF>    3-Quadriplegia/Swallow Dysfunction/Stage III sacral Decubitus Ulcer, POA;  -Continue hospice follow-up -Continue with tube feedings -Continue wound care recommendation   4-Hypokalemia: Replace   5-Melena: Per registered nurse patient has some melanotic stool afternoon 9/14 FOBT positive Monitor for further recurrence On oral iron, BLACK STOOL Plan to repeat IV iron today.  Discharge Planning: back to [facility] with hospice services when stable to discharge, notes reflect possibly tomorrow.  Family Contact: Spoke with [name] by phone twice, updated regarding visit and notes about possible returning to facility tomorrow.  IDT: updated  Goals of Care: treat the treatable and then return to facility with the support of hospice.  Should patient need ambulance transport at discharge please use [facility] as they contract this service for our active hospice patients.  Please  call with any hospice related questions or concerns.  Thank you, Haynes Bast, BSN, RN [facility] Hospice Liaison [phone]

## 2021-03-09 NOTE — Progress Notes (Signed)
PROGRESS NOTE    Shannon [name]  PJA:[id number] DOB: 20-Dec-1945 DOA: 03/02/2021 PCP: [name], MD   Brief Narrative: 75 year old with past medical history significant for quadriplegia, Chiari  malformation type I, hypertension, chronic indwelling Foley catheter, asthma, stage IV sacral decubitus ulcer who was transferred from a skilled nursing facility to the hospital due to significant hematuria.  Patient has been under hospice care.  Patient developed dark red urine with clots, unable to irrigate Foley catheter.  Presented with sodium of 147, creatinine 0.5 hemoglobin 8.0.  UA with more than 50 white blood cells and more than 50 red blood cell.  He was a started on IV antibiotics and urology was consulted.  Patient was placed on continuous bladder irrigation.  Patient hemoglobin dropped to 5.8 on 9/11.  He received 2 units of packed red blood cell.  On 9/12 hemoglobin 7.1 he received 3rd unit of packed red blood cell.  He underwent cystoscopy per urology on 9/13 with TURP and calculus extraction.  There were some concern for melanotic stool, occult blood positive.  Plan to follow hemoglobin.   Assessment & Plan:   Principal Problem:   Sepsis secondary to UTI [facility]) Active Problems:   Quadriplegia and quadriparesis (HCC)   Acute blood loss anemia   Chronic indwelling Foley catheter   Stage IV pressure ulcer of sacral region (HCC)   Melena  1-Sepsis secondary to complicated UTI in the setting of chronic Foley catheter, POA -Patient is status post TURP with calculus extraction.  Urology 03/06/2021 -Patient received CBI. -Hematuria improving. -Continue empirically cefepime -Urology following  2-Acute blood loss anemia secondary to hematuria/prostate source/iron deficiency anemia: -Patient has received 3 unit of packed red blood cell -Hbstable 7.5. -FOBT positive, continue to monitor for melanotic stool.  No bowel movement in the last 24 hours -Received IV iron 9/15.  Plan to  repeat IV iron today. -If hemoglobin continues to be stable tomorrow he can be transferred to SNF>   3-Quadriplegia/Swallow Dysfunction/Stage III sacral Decubitus Ulcer, POA;  -Continue hospice follow-up -Continue with tube feedings -Continue wound care recommendation  4-Hypokalemia: Replace  5-Melena: Per registered nurse patient has some melanotic stool afternoon 9/14 FOBT positive Monitor for further recurrence On oral iron, BLACK STOOL Plan to repeat IV iron today.       Pressure Injury 03/02/21 Vertebral column Lower;Medial Stage 4 - Full thickness tissue loss with exposed bone, tendon or muscle. Full thickness wound with exposed tendon and ligaments. Pink with some streaked white slough, with area of beef reddness and  (Active)  03/02/21 2305  Location: Vertebral column  Location Orientation: Lower;Medial  Staging: Stage 4 - Full thickness tissue loss with exposed bone, tendon or muscle.  Wound Description (Comments): Full thickness wound with exposed tendon and ligaments. Pink with some streaked white slough, with area of beef reddness and notable blood supply oozing in the wound bed. 9.5 cm X 6.25 cm 1 cm depth and 1.25 undermining  Present on Admission: Yes     Pressure Injury 06/22/20 Sacrum Posterior Stage 4 - Full thickness tissue loss with exposed bone, tendon or muscle. PHYSICAL THERAPY/HYDROTHERAPY (Active)  06/22/20 1400  Location: Sacrum  Location Orientation: Posterior  Staging: Stage 4 - Full thickness tissue loss with exposed bone, tendon or muscle.  Wound Description (Comments): PHYSICAL THERAPY/HYDROTHERAPY  Present on Admission: Yes     Pressure Injury 12/14/20 Head Lower;Posterior Deep Tissue Pressure Injury - Purple or maroon localized area of discolored intact skin or blood-filled blister due  to damage of underlying soft tissue from pressure and/or shear. Maroon discoloration, bleedi (Active)  12/14/20 0600  Location: Head  Location Orientation:  Lower;Posterior  Staging: Deep Tissue Pressure Injury - Purple or maroon localized area of discolored intact skin or blood-filled blister due to damage of underlying soft tissue from pressure and/or shear.  Wound Description (Comments): Maroon discoloration, bleeding, nonblanchable  Present on Admission:      Pressure Injury 02/02/21 Heel Left Deep Tissue Pressure Injury - Purple or maroon localized area of discolored intact skin or blood-filled blister due to damage of underlying soft tissue from pressure and/or shear. (Active)  02/02/21 2244  Location: Heel  Location Orientation: Left  Staging: Deep Tissue Pressure Injury - Purple or maroon localized area of discolored intact skin or blood-filled blister due to damage of underlying soft tissue from pressure and/or shear.  Wound Description (Comments):   Present on Admission:      Pressure Injury 03/02/21 Toe (Comment  which one) Anterior;Left Deep Tissue Pressure Injury - Purple or maroon localized area of discolored intact skin or blood-filled blister due to damage of underlying soft tissue from pressure and/or shear. (Active)  03/02/21 2308  Location: Toe (Comment  which one)  Location Orientation: Anterior;Left  Staging: Deep Tissue Pressure Injury - Purple or maroon localized area of discolored intact skin or blood-filled blister due to damage of underlying soft tissue from pressure and/or shear.  Wound Description (Comments):   Present on Admission: Yes     Nutrition Problem: Increased nutrient needs Etiology: acute illness, wound healing    Signs/Symptoms: estimated needs    Interventions: Tube feeding, Prostat, Juven  Estimated body mass index is 24.6 kg/m as calculated from the following:   Height as of this encounter: 5\' 11"  (1.803 m).   Weight as of this encounter: 80 kg.   DVT prophylaxis: SCD Code Status: DNR Family Communication: no family at bedside Disposition Plan:  Status is: Inpatient  Remains  inpatient appropriate because:IV treatments appropriate due to intensity of illness or inability to take PO  Dispo: The patient is from: SNF              Anticipated d/c is to: SNF              Patient currently is not medically stable to d/c.   Difficult to place patient No        Consultants:  Urology: Dr. 03/02/2021 WOC RN  Procedures:  TURP/cystoscopy with extraction of bladder calculus per Dr. 05/02/2021 03/06/2021 Transfusion 3 units packed red blood cells  Antimicrobials:  IV cefepime 03/02/2021>>>>> 03/09/2021  Subjective: Alert, complaints of leg pain. No BM reporter per nurse.   Objective: Vitals:   03/08/21 1935 03/09/21 0300 03/09/21 0844 03/09/21 1331  BP: (!) 93/52 109/77  110/76  Pulse: 98 97  95  Resp: 16 16  18   Temp: 100.2 F (37.9 C) 99.5 F (37.5 C)  98 F (36.7 C)  TempSrc: Axillary Axillary  Oral  SpO2: 95% 94% 96%   Weight:      Height:        Intake/Output Summary (Last 24 hours) at 03/09/2021 1503 Last data filed at 03/09/2021 1335 Gross per 24 hour  Intake --  Output 2500 ml  Net -2500 ml   Filed Weights   03/02/21 2107 03/02/21 2300  Weight: 79.6 kg 80 kg    Examination:  General exam: frail, chronic ill Respiratory system: Clear to auscultation. Respiratory effort normal. Cardiovascular system: S1 &  S2 heard, RRR.  Gastrointestinal system: Abdomen is nondistended, soft and nontender. Peg tube in place.  Central nervous system: Alert , quadriplegic Extremities: trace edema   Data Reviewed: I have personally reviewed following labs and imaging studies  CBC: Recent Labs  Lab 03/05/21 0700 03/06/21 0326 03/06/21 2311 03/07/21 0015 03/08/21 0335 03/08/21 1338 03/09/21 0333  WBC 10.1 9.8  --  12.1* 9.4  --  8.2  NEUTROABS  --   --   --   --  5.9  --  4.7  HGB 7.3* 7.7* 8.2* 8.0* 7.3* 7.2* 7.5*  HCT 24.0* 25.3* 27.6* 27.0* 24.4* 24.4* 24.8*  MCV 80.5 80.1  --  80.4 80.3  --  79.5*  PLT 213 186  --  201 196  --  197    Basic Metabolic Panel: Recent Labs  Lab 03/05/21 0700 03/06/21 0326 03/07/21 0015 03/08/21 0335 03/09/21 0333  NA 142 136 135 137 135  K 3.5 3.4* 4.1 3.9 3.9  CL 108 106 102 104 102  CO2 GLUCOSE 124* 138* 110* 127* 130*  BUN 31* 28* 21 23 24*  CREATININE 0.46* 0.41* 0.33* 0.45* 0.46*  CALCIUM 8.4* 8.2* 8.3* 8.3* 8.1*   GFR: Estimated Creatinine Clearance: 86.3 mL/min (A) (by C-G formula based on SCr of 0.46 mg/dL (L)). Liver Function Tests: Recent Labs  Lab 03/03/21 0038  AST 27  ALT 18  ALKPHOS 85  BILITOT 0.9  PROT 8.9*  ALBUMIN 1.9*   No results for input(s): LIPASE, AMYLASE in the last 168 hours. No results for input(s): AMMONIA in the last 168 hours. Coagulation Profile: Recent Labs  Lab 03/03/21 0038  INR 1.5*   Cardiac Enzymes: No results for input(s): CKTOTAL, CKMB, CKMBINDEX, TROPONINI in the last 168 hours. BNP (last 3 results) No results for input(s): PROBNP in the last 8760 hours. HbA1C: No results for input(s): HGBA1C in the last 72 hours. CBG: No results for input(s): GLUCAP in the last 168 hours. Lipid Profile: No results for input(s): CHOL, HDL, LDLCALC, TRIG, CHOLHDL, LDLDIRECT in the last 72 hours. Thyroid Function Tests: No results for input(s): TSH, T4TOTAL, FREET4, T3FREE, THYROIDAB in the last 72 hours. Anemia Panel: Recent Labs    03/08/21 1338  VITAMINB12 431  FOLATE 10.6  FERRITIN 33  TIBC 202*  IRON 15*   Sepsis Labs: Recent Labs  Lab 03/02/21 1809 03/02/21 2045 03/03/21 0038  PROCALCITON  --   --  <0.10  LATICACIDVEN 1.8 3.1* 2.0*    Recent Results (from the past 240 hour(s))  Resp Panel by RT-PCR (Flu A&B, Covid) Nasopharyngeal Swab     Status: None   Collection Time: 03/02/21  6:58 PM   Specimen: Nasopharyngeal Swab; Nasopharyngeal(NP) swabs in vial transport medium  Result Value Ref Range Status   SARS Coronavirus 2 by RT PCR NEGATIVE NEGATIVE Final    Comment: (NOTE) SARS-CoV-2 target  nucleic acids are NOT DETECTED.  The SARS-CoV-2 RNA is generally detectable in upper respiratory specimens during the acute phase of infection. The lowest concentration of SARS-CoV-2 viral copies this assay can detect is 138 copies/mL. A negative result does not preclude SARS-Cov-2 infection and should not be used as the sole basis for treatment or other patient management decisions. A negative result may occur with  improper specimen collection/handling, submission of specimen other than nasopharyngeal swab, presence of viral mutation(s) within the areas targeted by this assay, and inadequate number of viral copies(<138 copies/mL). A negative result must be  combined with clinical observations, patient history, and epidemiological information. The expected result is Negative.  Fact Sheet for Patients:  BloggerCourse.com  Fact Sheet for Healthcare Providers:  SeriousBroker.it  This test is no t yet approved or cleared by the [place] FDA and  has been authorized for detection and/or diagnosis of SARS-CoV-2 by FDA under an Emergency Use Authorization (EUA). This EUA will remain  in effect (meaning this test can be used) for the duration of the COVID-19 declaration under Section 564(b)(1) of the Act, 21 U.S.C.section 360bbb-3(b)(1), unless the authorization is terminated  or revoked sooner.       Influenza A by PCR NEGATIVE NEGATIVE Final   Influenza B by PCR NEGATIVE NEGATIVE Final    Comment: (NOTE) The Xpert Xpress SARS-CoV-2/FLU/RSV plus assay is intended as an aid in the diagnosis of influenza from Nasopharyngeal swab specimens and should not be used as a sole basis for treatment. Nasal washings and aspirates are unacceptable for Xpert Xpress SARS-CoV-2/FLU/RSV testing.  Fact Sheet for Patients: BloggerCourse.com  Fact Sheet for Healthcare  Providers: SeriousBroker.it  This test is not yet approved or cleared by the [place] FDA and has been authorized for detection and/or diagnosis of SARS-CoV-2 by FDA under an Emergency Use Authorization (EUA). This EUA will remain in effect (meaning this test can be used) for the duration of the COVID-19 declaration under Section 564(b)(1) of the Act, 21 U.S.C. section 360bbb-3(b)(1), unless the authorization is terminated or revoked.  Performed at [facility], [address] [place], [place] [address]   Culture, blood (routine x 2)     Status: Abnormal   Collection Time: 03/02/21 10:30 PM   Specimen: BLOOD  Result Value Ref Range Status   Specimen Description   Final    BLOOD BLOOD LEFT HAND Performed at [facility], [address], [place], [place] [address]    Special Requests   Final    BOTTLES DRAWN AEROBIC AND ANAEROBIC Blood Culture adequate volume Performed at [facility], [address], [place], [place] [address]    Culture  Setup Time   Final    GRAM POSITIVE COCCI IN CLUSTERS ANAEROBIC BOTTLE ONLY Organism ID to follow CRITICAL RESULT CALLED TO, READ BACK BY AND VERIFIED WITH: A PHAM PHARMD [id number] 03/05/21 A BROWNING    Culture (A)  Final    STAPHYLOCOCCUS COHNII THE SIGNIFICANCE OF ISOLATING THIS ORGANISM FROM A SINGLE SET OF BLOOD CULTURES WHEN MULTIPLE SETS ARE DRAWN IS UNCERTAIN. PLEASE NOTIFY THE MICROBIOLOGY DEPARTMENT WITHIN ONE WEEK IF SPECIATION AND SENSITIVITIES ARE REQUIRED. Performed at [facility], [address], [place], [place] [address]    Report Status 03/07/2021 FINAL  Final  Blood Culture ID Panel (Reflexed)     Status: Abnormal   Collection Time: 03/02/21 10:30 PM  Result Value Ref Range Status   Enterococcus faecalis NOT DETECTED NOT DETECTED Final   Enterococcus Faecium NOT DETECTED NOT DETECTED Final   Listeria monocytogenes NOT DETECTED NOT  DETECTED Final   Staphylococcus species DETECTED (A) NOT DETECTED Final    Comment: CRITICAL RESULT CALLED TO, READ BACK BY AND VERIFIED WITH: A PHAM PHARMD [id number] 03/05/21 A BROWNING    Staphylococcus aureus (BCID) NOT DETECTED NOT DETECTED Final   Staphylococcus epidermidis NOT DETECTED NOT DETECTED Final   Staphylococcus lugdunensis NOT DETECTED NOT DETECTED Final   Streptococcus species NOT DETECTED NOT DETECTED Final   Streptococcus agalactiae NOT DETECTED NOT DETECTED Final   Streptococcus pneumoniae  NOT DETECTED NOT DETECTED Final   Streptococcus pyogenes NOT DETECTED NOT DETECTED Final   A.calcoaceticus-baumannii NOT DETECTED NOT DETECTED Final   Bacteroides fragilis NOT DETECTED NOT DETECTED Final   Enterobacterales NOT DETECTED NOT DETECTED Final   Enterobacter cloacae complex NOT DETECTED NOT DETECTED Final   Escherichia coli NOT DETECTED NOT DETECTED Final   Klebsiella aerogenes NOT DETECTED NOT DETECTED Final   Klebsiella oxytoca NOT DETECTED NOT DETECTED Final   Klebsiella pneumoniae NOT DETECTED NOT DETECTED Final   Proteus species NOT DETECTED NOT DETECTED Final   Salmonella species NOT DETECTED NOT DETECTED Final   Serratia marcescens NOT DETECTED NOT DETECTED Final   Haemophilus influenzae NOT DETECTED NOT DETECTED Final   Neisseria meningitidis NOT DETECTED NOT DETECTED Final   Pseudomonas aeruginosa NOT DETECTED NOT DETECTED Final   Stenotrophomonas maltophilia NOT DETECTED NOT DETECTED Final   Candida albicans NOT DETECTED NOT DETECTED Final   Candida auris NOT DETECTED NOT DETECTED Final   Candida glabrata NOT DETECTED NOT DETECTED Final   Candida krusei NOT DETECTED NOT DETECTED Final   Candida parapsilosis NOT DETECTED NOT DETECTED Final   Candida tropicalis NOT DETECTED NOT DETECTED Final   Cryptococcus neoformans/gattii NOT DETECTED NOT DETECTED Final    Comment: Performed at [facility], [address], [place], [place] [address]  Culture, blood  (routine x 2)     Status: None   Collection Time: 03/03/21 12:38 AM   Specimen: BLOOD  Result Value Ref Range Status   Specimen Description   Final    BLOOD RIGHT WRIST Performed at [facility], [address] [place], [place] [address]    Special Requests   Final    BOTTLES DRAWN AEROBIC ONLY Blood Culture adequate volume Performed at [facility], [address] [place], [place] [address]    Culture   Final    NO GROWTH 5 DAYS Performed at [facility], [address], [place], [place] [address]    Report Status 03/08/2021 FINAL  Final  Surgical PCR screen     Status: Abnormal   Collection Time: 03/06/21  2:23 PM   Specimen: Nasal Mucosa; Nasal Swab  Result Value Ref Range Status   MRSA, PCR POSITIVE (A) NEGATIVE Final    Comment: RESULT CALLED TO, READ BACK BY AND VERIFIED WITH: RN Shela [facility] AT 3704 03/07/21 CRUICKSHANK A    Staphylococcus aureus POSITIVE (A) NEGATIVE Final    Comment: (NOTE) The Xpert SA Assay (FDA approved for NASAL specimens in patients 88 years of age and older), is one component of a comprehensive surveillance program. It is not intended to diagnose infection nor to guide or monitor treatment. Performed at [facility], [address], [place], [place] [address]   SARS CORONAVIRUS 2 (TAT 6-24 HRS) Nasopharyngeal Nasopharyngeal Swab     Status: None   Collection Time: 03/08/21  8:08 PM   Specimen: Nasopharyngeal Swab  Result Value Ref Range Status   SARS Coronavirus 2 NEGATIVE NEGATIVE Final    Comment: (NOTE) SARS-CoV-2 target nucleic acids are NOT DETECTED.  The SARS-CoV-2 RNA is generally detectable in upper and lower respiratory specimens during the acute phase of infection. Negative results do not preclude SARS-CoV-2 infection, do not rule out co-infections with other pathogens, and should not be used as the sole basis for treatment or other patient management  decisions. Negative results must be combined with clinical observations, patient history, and epidemiological information. The expected result  is Negative.  Fact Sheet for Patients: HairSlick.no  Fact Sheet for Healthcare Providers: quierodirigir.com  This test is not yet approved or cleared by the [place] FDA and  has been authorized for detection and/or diagnosis of SARS-CoV-2 by FDA under an Emergency Use Authorization (EUA). This EUA will remain  in effect (meaning this test can be used) for the duration of the COVID-19 declaration under Se ction 564(b)(1) of the Act, 21 U.S.C. section 360bbb-3(b)(1), unless the authorization is terminated or revoked sooner.  Performed at [facility], [address], [place], [place] [address]          Radiology Studies: No results found.      Scheduled Meds:  sodium chloride   Intravenous Once   albuterol  2.5 mg Inhalation BID   ascorbic acid  1,000 mg Per Tube Daily   Chlorhexidine Gluconate Cloth  6 each Topical Daily   docusate  100 mg Per Tube BID   DULoxetine  30 mg Oral Daily   ferrous sulfate  300 mg Per Tube Q1200   finasteride  5 mg Oral Daily   free water  100 mL Per Tube Q4H   guaiFENesin  100 mg Per Tube BID   lactobacillus  1 g Per Tube Daily   midodrine  10 mg Per Tube TID PC   mupirocin ointment  1 application Nasal BID   pantoprazole sodium  40 mg Per Tube BID   pregabalin  100 mg Per Tube BID   scopolamine  1 patch Transdermal Q72H   tamsulosin  0.4 mg Oral q1800   zinc sulfate  220 mg Per Tube Daily   Continuous Infusions:  ceFEPime (MAXIPIME) IV 2 g (03/09/21 0527)   feeding supplement (PIVOT 1.5 CAL) 1,000 mL (03/06/21 2354)   ferric gluconate (FERRLECIT) IVPB     sodium chloride irrigation       LOS: 7 days    Time spent: 35 minutes    Katrinka Herbison A Sagal Gayton, MD [facility]   If 7PM-7AM, please contact  night-coverage [email or web address]  03/09/2021, 3:03 PM

## 2021-03-09 NOTE — [place] FL2 (Signed)
[place] MEDICAID FL2 LEVEL OF CARE SCREENING TOOL     IDENTIFICATION  Patient Name: Shannon [name] Birthdate: 1945/11/06 Sex: male Admission Date (Current Location): 03/02/2021  [facility] and [place] Number:  Producer, television/film/video and Address:  [facility],  [address] [place], [place] [address]      Provider Number: [id number]  Attending Physician Name and Address:  Alba Cory, MD  Relative Name and Phone Number:  Novinger,[name] Daughter [phone], work [phone]    Current Level of Care: Hospital Recommended Level of Care: Skilled Nursing Facility Prior Approval Number:    Date Approved/Denied:   PASRR Number:    Discharge Plan: SNF    Current Diagnoses: Patient Active Problem List   Diagnosis Date Noted   Melena    Sepsis secondary to UTI (HCC) 03/03/2021   Sepsis due to gram-negative UTI (HCC) 03/02/2021   Severe sepsis with septic shock (HCC) 12/08/2020   Scrotal edema 11/18/2020   Stage IV pressure ulcer of sacral region (HCC) 11/13/2020   Anemia 11/13/2020   Hemorrhagic shock (HCC) 11/13/2020   Gross hematuria 11/12/2020   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   Hematuria 06/19/2020   Sacral decubitus ulcer 06/19/2020   COVID-19 virus infection 06/06/2020 06/19/2020   Palliative care by specialist    Goals of care, counseling/discussion    Protein-calorie malnutrition, severe 05/05/2020   Chronic indwelling Foley catheter 05/03/2020   Quadriplegia and quadriparesis (HCC)    Acute blood loss anemia    Slow transit constipation    Neuropathic pain    Asthma    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    Orientation RESPIRATION BLADDER Height & Weight     Self, Place  Normal Incontinent, Indwelling catheter Weight: 80 kg Height:  5\' 11"  (180.3 cm)  BEHAVIORAL SYMPTOMS/MOOD NEUROLOGICAL BOWEL NUTRITION STATUS      Incontinent Feeding tube  AMBULATORY STATUS COMMUNICATION OF NEEDS Skin   Extensive  Assist Verbally Other (Comment) (Sacrum stage 4, Head and heels)                       Personal Care Assistance Level of Assistance  Bathing, Feeding, Dressing, Total care Bathing Assistance: Maximum assistance Feeding assistance: Maximum assistance Dressing Assistance: Maximum assistance Total Care Assistance: Maximum assistance   Functional Limitations Info  Sight, Hearing, Speech Sight Info: Adequate Hearing Info: Adequate Speech Info: Adequate    SPECIAL CARE FACTORS FREQUENCY  PT (By licensed PT), OT (By licensed OT)     PT Frequency: eval and treat OT Frequency: eval and treat            Contractures Contractures Info: Not present    Additional Factors Info  Code Status, Allergies Code Status Info: DNR Allergies Info: Iodine, Penicillins           Current Medications (03/09/2021):  This is the current hospital active medication list Current Facility-Administered Medications  Medication Dose Route Frequency Provider Last Rate Last Admin   0.9 %  sodium chloride infusion (Manually program via Guardrails IV Fluids)   Intravenous Once Blount, 03/11/2021 T, NP       acetaminophen (TYLENOL) tablet 650 mg  650 mg Oral Q6H PRN Andi Devon, MD   650 mg at 03/05/21 1446   Or   acetaminophen (TYLENOL) suppository 650 mg  650 mg Rectal Q6H PRN 05/05/21, MD       albuterol (PROVENTIL) (2.5 MG/3ML) 0.083% nebulizer solution 2.5 mg  2.5 mg  Inhalation BID Arrien, York Ram, MD   2.5 mg at 03/09/21 2694   ascorbic acid (VITAMIN C) tablet 1,000 mg  1,000 mg Per Tube Daily Arrien, York Ram, MD   1,000 mg at 03/09/21 1058   ceFEPIme (MAXIPIME) 2 g in sodium chloride 0.9 % 100 mL IVPB  2 g Intravenous Q8H Lynden Ang, RPH 200 mL/hr at 03/09/21 1503 2 g at 03/09/21 1503   Chlorhexidine Gluconate Cloth 2 % PADS 6 each  6 each Topical Daily Arrien, York Ram, MD   6 each at 03/09/21 1100   docusate (COLACE) 50 MG/5ML liquid 100 mg  100 mg Per Tube  BID Coralie Keens, MD   100 mg at 03/09/21 1104   DULoxetine (CYMBALTA) DR capsule 30 mg  30 mg Oral Daily Arrien, York Ram, MD   30 mg at 03/09/21 1100   feeding supplement (PIVOT 1.5 CAL) liquid 1,000 mL  1,000 mL Per Tube Continuous Coralie Keens, MD 60 mL/hr at 03/06/21 2354 1,000 mL at 03/06/21 2354   ferric gluconate (FERRLECIT) 250 mg in sodium chloride 0.9 % 250 mL IVPB  250 mg Intravenous Once Regalado, Belkys A, MD       ferrous sulfate 300 (60 Fe) MG/5ML syrup 300 mg  300 mg Per Tube Q1200 Arrien, York Ram, MD   300 mg at 03/09/21 1104   finasteride (PROSCAR) tablet 5 mg  5 mg Oral Daily Ray Church III, MD   5 mg at 03/09/21 1058   free water 100 mL  100 mL Per Tube Q4H Arrien, York Ram, MD   100 mL at 03/09/21 1100   guaiFENesin (ROBITUSSIN) 100 MG/5ML solution 100 mg  100 mg Per Tube BID Arrien, York Ram, MD   100 mg at 03/09/21 1058   lactobacillus (FLORANEX/LACTINEX) granules 1 g  1 g Per Tube Daily Arrien, York Ram, MD   1 g at 03/09/21 1104   midodrine (PROAMATINE) tablet 10 mg  10 mg Per Tube TID PC Arrien, York Ram, MD   10 mg at 03/09/21 1503   mupirocin ointment (BACTROBAN) 2 % 1 application  1 application Nasal BID Arrien, York Ram, MD   1 application at 03/09/21 1059   ondansetron (ZOFRAN) tablet 4 mg  4 mg Oral Q6H PRN Rometta Emery, MD       Or   ondansetron (ZOFRAN) injection 4 mg  4 mg Intravenous Q6H PRN Rometta Emery, MD   4 mg at 03/06/21 2026   oxyCODONE-acetaminophen (PERCOCET/ROXICET) 5-325 MG per tablet 1 tablet  1 tablet Per Tube Q4H PRN Arrien, York Ram, MD   1 tablet at 03/09/21 0444   pantoprazole sodium (PROTONIX) 40 mg/20 mL oral suspension 40 mg  40 mg Per Tube BID Rodolph Bong, MD   40 mg at 03/09/21 1113   pregabalin (LYRICA) capsule 100 mg  100 mg Per Tube BID Coralie Keens, MD   100 mg at 03/09/21 1059   scopolamine (TRANSDERM-SCOP) 1 MG/3DAYS 1.5 mg  1  patch Transdermal Q72H Arrien, York Ram, MD   1.5 mg at 03/06/21 1812   sodium chloride irrigation 0.9 % 3,000 mL  3,000 mL Irrigation Continuous Arby Barrette, MD   3,000 mL at 03/04/21 1652   tamsulosin (FLOMAX) capsule 0.4 mg  0.4 mg Oral q1800 Arrien, York Ram, MD   0.4 mg at 03/08/21 1635   zinc sulfate capsule 220 mg  220 mg Per Tube Daily Arrien, York Ram, MD  220 mg at 03/09/21 1059     Discharge Medications: Please see discharge summary for a list of discharge medications.  Relevant Imaging Results:  Relevant Lab Results:   Additional Information [id number]  Geni Bers, RN

## 2021-03-09 NOTE — Care Management Important Message (Signed)
Important Message  Patient Details IM Letter given to the Patient. Name: Aristidis Talerico MRN: [id number] Date of Birth: [birth year]   Medicare Important Message Given:  Yes     Caren Macadam 03/09/2021, 10:34 AM

## 2021-03-09 NOTE — TOC Progression Note (Signed)
Transition of Care [facility]) - Progression Note    Patient Details  Name: Shannon [name] MRN: [id number] Date of Birth: 08/20/1945  [facility]) CM/SW Contact  Geni Bers, RN Phone Number: 03/09/2021, 3:34 PM  Clinical Narrative:    Pt may return to [facility]. Will need COVID test.    Expected Discharge Plan: Skilled Nursing Facility Barriers to Discharge: No Barriers Identified  Expected Discharge Plan and Services Expected Discharge Plan: Skilled Nursing Facility       Living arrangements for the past 2 months: Skilled Nursing Facility                                       Social Determinants of Health (SDOH) Interventions    Readmission Risk Interventions No flowsheet data found.

## 2021-03-09 NOTE — Care Management Important Message (Signed)
Important Message  Patient Details IM Letter given to the Patient. Name: Shannon [name] MRN: [id number] Date of Birth: 1945-07-15   Medicare Important Message Given:  Yes     Caren Macadam 03/09/2021, 10:45 AM

## 2021-03-10 DIAGNOSIS — E876 Hypokalemia: Secondary | ICD-10-CM

## 2021-03-10 LAB — SARS CORONAVIRUS 2 (TAT 6-24 HRS): SARS Coronavirus 2: NEGATIVE

## 2021-03-10 LAB — CBC
HCT: 25.5 % — ABNORMAL LOW (ref 39.0–52.0)
Hemoglobin: 7.4 g/dL — ABNORMAL LOW (ref 13.0–17.0)
MCH: 23.6 pg — ABNORMAL LOW (ref 26.0–34.0)
MCHC: 29 g/dL — ABNORMAL LOW (ref 30.0–36.0)
MCV: 81.5 fL (ref 80.0–100.0)
Platelets: 220 10*3/uL (ref 150–400)
RBC: 3.13 MIL/uL — ABNORMAL LOW (ref 4.22–5.81)
RDW: 19.6 % — ABNORMAL HIGH (ref 11.5–15.5)
WBC: 8.8 10*3/uL (ref 4.0–10.5)
nRBC: 0 % (ref 0.0–0.2)

## 2021-03-10 MED ORDER — FINASTERIDE 5 MG PO TABS: 5.0000 mg | ORAL_TABLET | Freq: Every day | ORAL | 1 refills | Status: AC

## 2021-03-10 MED ORDER — ASPIRIN 81 MG PO CHEW: 81.0000 mg | CHEWABLE_TABLET | Freq: Every morning | ORAL | Status: AC

## 2021-03-10 MED ORDER — ALBUTEROL SULFATE (2.5 MG/3ML) 0.083% IN NEBU: 2.5000 mg | INHALATION_SOLUTION | Freq: Four times a day (QID) | RESPIRATORY_TRACT | Status: DC | PRN

## 2021-03-10 MED ORDER — FREE WATER: 100.0000 mL | Status: AC

## 2021-03-10 MED ORDER — MUPIROCIN 2 % EX OINT: 1.0000 "application " | TOPICAL_OINTMENT | Freq: Two times a day (BID) | CUTANEOUS | 0 refills | Status: AC

## 2021-03-10 MED ORDER — OMEPRAZOLE 20 MG PO CPDR: 20.0000 mg | DELAYED_RELEASE_CAPSULE | Freq: Two times a day (BID) | ORAL | 1 refills | Status: AC

## 2021-03-10 MED ORDER — OXYCODONE-ACETAMINOPHEN 5-325 MG PO TABS: 1.0000 | ORAL_TABLET | ORAL | 0 refills | Status: DC | PRN

## 2021-03-10 NOTE — Discharge Summary (Signed)
Physician Discharge Summary  Shannon [name] ACZ:[id number] DOB: 1946/02/24 DOA: 03/02/2021  PCP: [name], MD  Admit date: 03/02/2021 Discharge date: 03/10/2021  Time spent: 60 minutes  Recommendations for Outpatient Follow-up:  Follow-up with Dr. Alvester Morin urology in 2 weeks. Follow-up with MD at SNF.  Patient will need a basic metabolic profile, CBC done to follow-up on electrolytes, renal function, hemoglobin. Follow-up with hospice at [facility]. Follow-up with McGowen, Maryjean Morn, MD in 2 weeks.   Discharge Diagnoses:  Principal Problem:   Sepsis secondary to UTI [facility]) Active Problems:   Quadriplegia and quadriparesis (HCC)   Acute blood loss anemia   Chronic indwelling Foley catheter   Stage IV pressure ulcer of sacral region [facility])   Melena   Discharge Condition: Stable and improved  Diet recommendation: On tube feeds.  N.p.o.  Filed Weights   03/02/21 2107 03/02/21 2300  Weight: 79.6 kg 80 kg    History of present illness:  HPI per Dr. Renae Fickle Shannon [name] is a 75 y.o. male with medical history significant of quadriplegia, Chiari malformation type I, hypertension, osteoarthritis, stage IV sacral decubitus ulcer, chronic indwelling catheter, asthma and previous COVID-19 infection who was admitted to the ER from the skilled facility with significant hematuria.  Patient is DNR and supposed to be hospice but daughter wants treatment done.  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  He was noted to have dark red urine with clots.  His Foley will not irrigate.  Additionally has evidence of UTI with sepsis.  Patient is nonverbal at this point.  History obtained from nursing home records and daughter.  He was seen and evaluated.  Initiated on IV antibiotics.  Aggressive irrigation of his bladder with change in Foley catheter was performed by urology.  At this point patient is being admitted to the hospital for treatment of sepsis and management  of gross hematuria.  Patient is DNI.   ED Course: Temperature 98.6 blood pressure 137/93, pulse 127, respirate of 22 and oxygen sats 95% on room air.  White count is 13.3 hemoglobin 8.0 and platelets 274.  Sodium is 147 potassium 4.3 chloride 10, CO2 of 29 glucose 131 BUN 27 creatinine of 0.45.  Lactic acid initially 1.8 and trended to 3.1.  Influenza and COVID-19 screen is negative.  Urinalysis showed turbid urine bacteria many WBC more than 50 but RBC more than 50 positive nitrite.  Patient therefore suspected to have sepsis due to UTI in addition to gross hematuria.  Hospital Course:  1. sepsis secondary to complicated UTI in the setting of chronic Foley catheter, POA -Patient status post TURP with calculus extraction per urology 03/06/2021. -Patient underwent CBI. -Hematuria cleared in Foley bag with CBI which was subsequently discontinued.  -Patient hemodynamically stable and maintained on home regimen of midodrine, pregabalin, scopolamine, oxycodone, tamsulosin. -WBC was fluctuating but trended down to 8.8 by day of discharge -Patient received a full course of IV cefepime as patient noted to have a prior history of Pseudomonas in urine culture from 10/2020. -Patient remained afebrile. -Patient followed by urology during the hospitalization and will follow up with urology in the outpatient setting. -Patient was discharged with Foley catheter as well as Proscar as recommended per urology.  2.  Acute blood loss anemia secondary to hematuria/prostate source/iron deficiency anemia -Status post transfusion 3 units packed red blood cells during the hospitalization. -Hemoglobin remained stable at 7.4 by day of discharge.   -Patient noted to  have a melanotic stool per RN later on the afternoon of 03/07/2021.   -FOBT positive with no further melanotic stools noted.  -Patient also noted to be on oral iron supplementation.-PPI increased to twice daily.   -Anemia panel ordered and consistent with iron  deficiency anemia and as such patient received IV iron during the hospitalization. -Outpatient follow-up with PCP.  3.  Quadriplegia/swallow dysfunction/stage III sacral decubitus ulcer, POA -Deconditioned and weak being followed by hospice in the outpatient setting. -Patient maintained on tube feeds during the hospitalization.   -Patient seen by wound care nurse and dressing changes recommendations were made and followed during the hospitalization.    4.  Hypokalemia -Repleted.  5.  Melena -Per RN patient with some melanotic stools the afternoon of 03/07/2021.  -FOBT positive.   -No further melanotic stools noted during the hospitalization.   -Anemia panel obtained consistent with iron deficiency anemia.   -Patient noted to be having black stools on oral iron.   -Patient received IV iron during the hospitalization and PPI increased to twice daily.   -Hemoglobin remained stable at 7.4 by day of discharge.    6.  Pressure injury, POA Pressure Injury 03/02/21 Vertebral column Lower;Medial Stage 4 - Full thickness tissue loss with exposed bone, tendon or muscle. Full thickness wound with exposed tendon and ligaments. Pink with some streaked white slough, with area of beef reddness and  (Active)  03/02/21 2305  Location: Vertebral column  Location Orientation: Lower;Medial  Staging: Stage 4 - Full thickness tissue loss with exposed bone, tendon or muscle.  Wound Description (Comments): Full thickness wound with exposed tendon and ligaments. Pink with some streaked white slough, with area of beef reddness and notable blood supply oozing in the wound bed. 9.5 cm X 6.25 cm 1 cm depth and 1.25 undermining  Present on Admission: Yes     Pressure Injury 06/22/20 Sacrum Posterior Stage 4 - Full thickness tissue loss with exposed bone, tendon or muscle. PHYSICAL THERAPY/HYDROTHERAPY (Active)  06/22/20 1400  Location: Sacrum  Location Orientation: Posterior  Staging: Stage 4 - Full thickness tissue  loss with exposed bone, tendon or muscle.  Wound Description (Comments): PHYSICAL THERAPY/HYDROTHERAPY  Present on Admission: Yes     Pressure Injury 12/14/20 Head Lower;Posterior Deep Tissue Pressure Injury - Purple or maroon localized area of discolored intact skin or blood-filled blister due to damage of underlying soft tissue from pressure and/or shear. Maroon discoloration, bleedi (Active)  12/14/20 0600  Location: Head  Location Orientation: Lower;Posterior  Staging: Deep Tissue Pressure Injury - Purple or maroon localized area of discolored intact skin or blood-filled blister due to damage of underlying soft tissue from pressure and/or shear.  Wound Description (Comments): Maroon discoloration, bleeding, nonblanchable  Present on Admission:      Pressure Injury 02/02/21 Heel Left Deep Tissue Pressure Injury - Purple or maroon localized area of discolored intact skin or blood-filled blister due to damage of underlying soft tissue from pressure and/or shear. (Active)  02/02/21 2244  Location: Heel  Location Orientation: Left  Staging: Deep Tissue Pressure Injury - Purple or maroon localized area of discolored intact skin or blood-filled blister due to damage of underlying soft tissue from pressure and/or shear.  Wound Description (Comments):   Present on Admission:      Pressure Injury 03/02/21 Toe (Comment  which one) Anterior;Left Deep Tissue Pressure Injury - Purple or maroon localized area of discolored intact skin or blood-filled blister due to damage of underlying soft tissue from pressure and/or  shear. (Active)  03/02/21 2308  Location: Toe (Comment  which one)  Location Orientation: Anterior;Left  Staging: Deep Tissue Pressure Injury - Purple or maroon localized area of discolored intact skin or blood-filled blister due to damage of underlying soft tissue from pressure and/or shear.  Wound Description (Comments):   Present on Admission: Yes         Procedures: TURP/cystoscopy with extraction of bladder calculus per Dr. Alvester Morin 03/06/2021 Transfusion 3 units packed red blood cells  Consultations: Urology: Dr. Benancio Deeds 03/02/2021 WOC RN Hospice following in-house.  Discharge Exam: Vitals:   03/10/21 0549 03/10/21 1033  BP: (!) 108/59 117/73  Pulse: 99 98  Resp: 20 16  Temp: 98.8 F (37.1 C) 98.4 F (36.9 C)  SpO2: 97% 94%    General: Quadriplegic.  NAD. Cardiovascular: RRR no murmurs rubs or gallops.  No JVD.  No lower extremity edema. Respiratory: CTA B anterior lung fields.  Discharge Instructions   Discharge Instructions     Discharge wound care:   Complete by: As directed    As above   Increase activity slowly   Complete by: As directed       Allergies as of 03/10/2021       Reactions   Iodine Swelling, Other (See Comments)   "NOT CT CONTRAST" "Allergic," per MAR   Penicillins Itching, Swelling, Other (See Comments)   "** tolerates cephalosporins; facial swelling, itchy throat" "Allergic," per [facility]        Medication List     TAKE these medications    Acidophilus Lactobacillus Caps Place 1 capsule into feeding tube in the morning.   albuterol 108 (90 Base) MCG/ACT inhaler Commonly known as: VENTOLIN HFA Inhale 2 puffs into the lungs 2 (two) times daily.   ascorbic acid 500 MG tablet Commonly known as: VITAMIN C Place 1,000 mg into feeding tube daily.   aspirin 81 MG chewable tablet Place 1 tablet (81 mg total) into feeding tube every morning. Start taking on: March 17, 2021 What changed:  how to take this These instructions start on March 17, 2021. If you are unsure what to do until then, ask your doctor or other care provider.   Cranberry 400 MG Caps Place 400 mg into feeding tube in the morning.   docusate 50 MG/5ML liquid Commonly known as: COLACE Place 100 mg into feeding tube 2 (two) times daily.   DULoxetine 30 MG capsule Commonly known as: CYMBALTA 30 mg See admin  instructions. 30 ,g, per G-Tube, every morning   feeding supplement (OSMOLITE 1.5 CAL) Liqd Place 1,000 mLs into feeding tube continuous. What changed:  how much to take when to take this additional instructions Another medication with the same name was removed. Continue taking this medication, and follow the directions you see here.   nutrition supplement (JUVEN) Pack Place 1 packet into feeding tube 2 (two) times daily between meals. What changed:  when to take this additional instructions Another medication with the same name was removed. Continue taking this medication, and follow the directions you see here.   Ferrous Sulfate 220 (44 Fe) MG/5ML Liqd Give 220 mg by tube in the morning, at noon, and at bedtime. Via G tube   finasteride 5 MG tablet Commonly known as: PROSCAR Take 1 tablet (5 mg total) by mouth daily. Per tube Start taking on: March 11, 2021   free water Soln Place 100 mLs into feeding tube every 4 (four) hours.   guaiFENesin 100 MG/5ML liquid Commonly known as: ROBITUSSIN  Place 100 mg into feeding tube 2 (two) times daily.   midodrine 10 MG tablet Commonly known as: PROAMATINE Place 1 tablet (10 mg total) into feeding tube 3 (three) times daily.   mupirocin ointment 2 % Commonly known as: BACTROBAN Place 1 application into the nose 2 (two) times daily for 2 days.   nystatin powder Commonly known as: MYCOSTATIN/NYSTOP Apply 1 application topically See admin instructions. Apply topically once a day to sacrum peri-wound and surrounding skin- during wound dressing change   omeprazole 20 MG capsule Commonly known as: PRILOSEC Take 1 capsule (20 mg total) by mouth 2 (two) times daily before a meal. 20 mg, via G-Tube, 2 times daily x 4 weeks, then daily. What changed:  how to take this when to take this additional instructions   oxyCODONE-acetaminophen 5-325 MG tablet Commonly known as: PERCOCET/ROXICET Place 1 tablet into feeding tube every 4  (four) hours as needed for severe pain. What changed:  when to take this reasons to take this   polyethylene glycol 17 g packet Commonly known as: MIRALAX / GLYCOLAX Place 17 g into feeding tube See admin instructions. Mix 17 grams of powder into 8 ounces of water and place into G-Tube once a day   Pregabalin 20 MG/ML Soln Give 100 mg by tube 2 (two) times daily.   Pro-Stat Liqd Place 30 mLs into feeding tube daily.   scopolamine 1 MG/3DAYS Commonly known as: TRANSDERM-SCOP Place 1 patch onto the skin every 3 (three) days.   tamsulosin 0.4 MG Caps capsule Commonly known as: FLOMAX Take 1 capsule (0.4 mg total) by mouth daily. What changed:  how to take this when to take this additional instructions   zinc gluconate 50 MG tablet Place 50 mg into feeding tube daily.               Discharge Care Instructions  (From admission, onward)           Start     Ordered   03/10/21 0000  Discharge wound care:       Comments: As above   03/10/21 1245           Allergies  Allergen Reactions   Iodine Swelling and Other (See Comments)    "NOT CT CONTRAST" "Allergic," per MAR   Penicillins Itching, Swelling and Other (See Comments)    "** tolerates cephalosporins; facial swelling, itchy throat" "Allergic," per [facility]    Follow-up Information     McGowen, Maryjean Morn, MD. Schedule an appointment as soon as possible for a visit in 2 week(s).   Specialty: Family Medicine Contact information: [address] [place] [address] [phone]         Crista Elliot, MD. Schedule an appointment as soon as possible for a visit in 2 week(s).   Specialty: Urology Contact information: [address] [place] [address] [phone]         MD AT SNF Follow up.                   The results of significant diagnostics from this hospitalization (including imaging, microbiology, ancillary and laboratory) are listed below for reference.     Significant Diagnostic Studies: No results found.  Microbiology: Recent Results (from the past 240 hour(s))  Resp Panel by RT-PCR (Flu A&B, Covid) Nasopharyngeal Swab     Status: None   Collection Time: 03/02/21  6:58 PM   Specimen: Nasopharyngeal Swab; Nasopharyngeal(NP) swabs in vial  transport medium  Result Value Ref Range Status   SARS Coronavirus 2 by RT PCR NEGATIVE NEGATIVE Final    Comment: (NOTE) SARS-CoV-2 target nucleic acids are NOT DETECTED.  The SARS-CoV-2 RNA is generally detectable in upper respiratory specimens during the acute phase of infection. The lowest concentration of SARS-CoV-2 viral copies this assay can detect is 138 copies/mL. A negative result does not preclude SARS-Cov-2 infection and should not be used as the sole basis for treatment or other patient management decisions. A negative result may occur with  improper specimen collection/handling, submission of specimen other than nasopharyngeal swab, presence of viral mutation(s) within the areas targeted by this assay, and inadequate number of viral copies(<138 copies/mL). A negative result must be combined with clinical observations, patient history, and epidemiological information. The expected result is Negative.  Fact Sheet for Patients:  BloggerCourse.com  Fact Sheet for Healthcare Providers:  SeriousBroker.it  This test is no t yet approved or cleared by the [place] FDA and  has been authorized for detection and/or diagnosis of SARS-CoV-2 by FDA under an Emergency Use Authorization (EUA). This EUA will remain  in effect (meaning this test can be used) for the duration of the COVID-19 declaration under Section 564(b)(1) of the Act, 21 U.S.C.section 360bbb-3(b)(1), unless the authorization is terminated  or revoked sooner.       Influenza A by PCR NEGATIVE NEGATIVE Final   Influenza B by PCR NEGATIVE NEGATIVE Final    Comment:  (NOTE) The Xpert Xpress SARS-CoV-2/FLU/RSV plus assay is intended as an aid in the diagnosis of influenza from Nasopharyngeal swab specimens and should not be used as a sole basis for treatment. Nasal washings and aspirates are unacceptable for Xpert Xpress SARS-CoV-2/FLU/RSV testing.  Fact Sheet for Patients: BloggerCourse.com  Fact Sheet for Healthcare Providers: SeriousBroker.it  This test is not yet approved or cleared by the [place] FDA and has been authorized for detection and/or diagnosis of SARS-CoV-2 by FDA under an Emergency Use Authorization (EUA). This EUA will remain in effect (meaning this test can be used) for the duration of the COVID-19 declaration under Section 564(b)(1) of the Act, 21 U.S.C. section 360bbb-3(b)(1), unless the authorization is terminated or revoked.  Performed at [facility], [address], [place], [place] [address]   Culture, blood (routine x 2)     Status: Abnormal   Collection Time: 03/02/21 10:30 PM   Specimen: BLOOD  Result Value Ref Range Status   Specimen Description   Final    BLOOD BLOOD LEFT HAND Performed at [facility], [address] [place], [place] [address]    Special Requests   Final    BOTTLES DRAWN AEROBIC AND ANAEROBIC Blood Culture adequate volume Performed at [facility], [address] [place], [place] [address]    Culture  Setup Time   Final    GRAM POSITIVE COCCI IN CLUSTERS ANAEROBIC BOTTLE ONLY Organism ID to follow CRITICAL RESULT CALLED TO, READ BACK BY AND VERIFIED WITH: A PHAM PHARMD [id number] 03/05/21 A BROWNING    Culture (A)  Final    STAPHYLOCOCCUS COHNII THE SIGNIFICANCE OF ISOLATING THIS ORGANISM FROM A SINGLE SET OF BLOOD CULTURES WHEN MULTIPLE SETS ARE DRAWN IS UNCERTAIN. PLEASE NOTIFY THE MICROBIOLOGY DEPARTMENT WITHIN ONE WEEK IF SPECIATION AND SENSITIVITIES ARE REQUIRED. Performed at  [facility], [address], [place], [place] [address]    Report Status 03/07/2021 FINAL  Final  Blood Culture ID Panel (Reflexed)  Status: Abnormal   Collection Time: 03/02/21 10:30 PM  Result Value Ref Range Status   Enterococcus faecalis NOT DETECTED NOT DETECTED Final   Enterococcus Faecium NOT DETECTED NOT DETECTED Final   Listeria monocytogenes NOT DETECTED NOT DETECTED Final   Staphylococcus species DETECTED (A) NOT DETECTED Final    Comment: CRITICAL RESULT CALLED TO, READ BACK BY AND VERIFIED WITH: A PHAM PHARMD [id number] 03/05/21 A BROWNING    Staphylococcus aureus (BCID) NOT DETECTED NOT DETECTED Final   Staphylococcus epidermidis NOT DETECTED NOT DETECTED Final   Staphylococcus lugdunensis NOT DETECTED NOT DETECTED Final   Streptococcus species NOT DETECTED NOT DETECTED Final   Streptococcus agalactiae NOT DETECTED NOT DETECTED Final   Streptococcus pneumoniae NOT DETECTED NOT DETECTED Final   Streptococcus pyogenes NOT DETECTED NOT DETECTED Final   A.calcoaceticus-baumannii NOT DETECTED NOT DETECTED Final   Bacteroides fragilis NOT DETECTED NOT DETECTED Final   Enterobacterales NOT DETECTED NOT DETECTED Final   Enterobacter cloacae complex NOT DETECTED NOT DETECTED Final   Escherichia coli NOT DETECTED NOT DETECTED Final   Klebsiella aerogenes NOT DETECTED NOT DETECTED Final   Klebsiella oxytoca NOT DETECTED NOT DETECTED Final   Klebsiella pneumoniae NOT DETECTED NOT DETECTED Final   Proteus species NOT DETECTED NOT DETECTED Final   Salmonella species NOT DETECTED NOT DETECTED Final   Serratia marcescens NOT DETECTED NOT DETECTED Final   Haemophilus influenzae NOT DETECTED NOT DETECTED Final   Neisseria meningitidis NOT DETECTED NOT DETECTED Final   Pseudomonas aeruginosa NOT DETECTED NOT DETECTED Final   Stenotrophomonas maltophilia NOT DETECTED NOT DETECTED Final   Candida albicans NOT DETECTED NOT DETECTED Final   Candida auris NOT DETECTED NOT DETECTED Final    Candida glabrata NOT DETECTED NOT DETECTED Final   Candida krusei NOT DETECTED NOT DETECTED Final   Candida parapsilosis NOT DETECTED NOT DETECTED Final   Candida tropicalis NOT DETECTED NOT DETECTED Final   Cryptococcus neoformans/gattii NOT DETECTED NOT DETECTED Final    Comment: Performed at [facility], [address], [place], [place] [address]  Culture, blood (routine x 2)     Status: None   Collection Time: 03/03/21 12:38 AM   Specimen: BLOOD  Result Value Ref Range Status   Specimen Description   Final    BLOOD RIGHT WRIST Performed at [facility], [address], [place], [place] [address]    Special Requests   Final    BOTTLES DRAWN AEROBIC ONLY Blood Culture adequate volume Performed at [facility], [address], [place], [place] [address]    Culture   Final    NO GROWTH 5 DAYS Performed at [facility], [address], [place], [place] [address]    Report Status 03/08/2021 FINAL  Final  Surgical PCR screen     Status: Abnormal   Collection Time: 03/06/21  2:23 PM   Specimen: Nasal Mucosa; Nasal Swab  Result Value Ref Range Status   MRSA, PCR POSITIVE (A) NEGATIVE Final    Comment: RESULT CALLED TO, READ BACK BY AND VERIFIED WITH: RN Shela Commons [facility] AT [id number] 03/07/21 CRUICKSHANK A    Staphylococcus aureus POSITIVE (A) NEGATIVE Final    Comment: (NOTE) The Xpert SA Assay (FDA approved for NASAL specimens in patients 45 years of age and older), is one component of a comprehensive surveillance program. It is not intended to diagnose infection nor to guide or monitor treatment. Performed at [facility], [address], [place], [place] [address]  SARS CORONAVIRUS 2 (TAT 6-24 HRS) Nasopharyngeal Nasopharyngeal Swab     Status: None   Collection Time: 03/08/21  8:08 PM   Specimen: Nasopharyngeal Swab  Result Value Ref Range Status   SARS Coronavirus 2 NEGATIVE NEGATIVE Final    Comment:  (NOTE) SARS-CoV-2 target nucleic acids are NOT DETECTED.  The SARS-CoV-2 RNA is generally detectable in upper and lower respiratory specimens during the acute phase of infection. Negative results do not preclude SARS-CoV-2 infection, do not rule out co-infections with other pathogens, and should not be used as the sole basis for treatment or other patient management decisions. Negative results must be combined with clinical observations, patient history, and epidemiological information. The expected result is Negative.  Fact Sheet for Patients: HairSlick.no  Fact Sheet for Healthcare Providers: quierodirigir.com  This test is not yet approved or cleared by the [place] FDA and  has been authorized for detection and/or diagnosis of SARS-CoV-2 by FDA under an Emergency Use Authorization (EUA). This EUA will remain  in effect (meaning this test can be used) for the duration of the COVID-19 declaration under Se ction 564(b)(1) of the Act, 21 U.S.C. section 360bbb-3(b)(1), unless the authorization is terminated or revoked sooner.  Performed at [facility], [address], [place], [place] [address]   SARS CORONAVIRUS 2 (TAT 6-24 HRS) Nasopharyngeal Nasopharyngeal Swab     Status: None   Collection Time: 03/09/21  6:31 PM   Specimen: Nasopharyngeal Swab  Result Value Ref Range Status   SARS Coronavirus 2 NEGATIVE NEGATIVE Final    Comment: (NOTE) SARS-CoV-2 target nucleic acids are NOT DETECTED.  The SARS-CoV-2 RNA is generally detectable in upper and lower respiratory specimens during the acute phase of infection. Negative results do not preclude SARS-CoV-2 infection, do not rule out co-infections with other pathogens, and should not be used as the sole basis for treatment or other patient management decisions. Negative results must be combined with clinical observations, patient history, and epidemiological  information. The expected result is Negative.  Fact Sheet for Patients: HairSlick.no  Fact Sheet for Healthcare Providers: quierodirigir.com  This test is not yet approved or cleared by the [place] FDA and  has been authorized for detection and/or diagnosis of SARS-CoV-2 by FDA under an Emergency Use Authorization (EUA). This EUA will remain  in effect (meaning this test can be used) for the duration of the COVID-19 declaration under Se ction 564(b)(1) of the Act, 21 U.S.C. section 360bbb-3(b)(1), unless the authorization is terminated or revoked sooner.  Performed at [facility], [address], [place], [place] [address]      Labs: Basic Metabolic Panel: Recent Labs  Lab 03/05/21 0700 03/06/21 0326 03/07/21 0015 03/08/21 0335 03/09/21 0333  NA 142 136 135 137 135  K 3.5 3.4* 4.1 3.9 3.9  CL 108 106 102 104 102  CO2 GLUCOSE 124* 138* 110* 127* 130*  BUN 31* 28* 21 23 24*  CREATININE 0.46* 0.41* 0.33* 0.45* 0.46*  CALCIUM 8.4* 8.2* 8.3* 8.3* 8.1*   Liver Function Tests: No results for input(s): AST, ALT, ALKPHOS, BILITOT, PROT, ALBUMIN in the last 168 hours. No results for input(s): LIPASE, AMYLASE in the last 168 hours. No results for input(s): AMMONIA in the last 168 hours. CBC: Recent Labs  Lab 03/06/21 0326 03/06/21 2311 03/07/21 0015 03/08/21 0335 03/08/21 1338 03/09/21 0333 03/10/21 0345  WBC 9.8  --  12.1* 9.4  --  8.2 8.8  NEUTROABS  --   --   --  5.9  --  4.7  --   HGB 7.7*   < > 8.0* 7.3* 7.2* 7.5* 7.4*  HCT 25.3*   < > 27.0* 24.4* 24.4* 24.8* 25.5*  MCV 80.1  --  80.4 80.3  --  79.5* 81.5  PLT 186  --  201 196  --  197 220   < > = values in this interval not displayed.   Cardiac Enzymes: No results for input(s): CKTOTAL, CKMB, CKMBINDEX, TROPONINI in the last 168 hours. BNP: BNP (last 3 results) Recent Labs    05/23/20 0432 05/24/20 0127 06/30/20 1000   BNP 35.1 32.0 35.9    ProBNP (last 3 results) No results for input(s): PROBNP in the last 8760 hours.  CBG: No results for input(s): GLUCAP in the last 168 hours.     Signed:  Ramiro Harvest MD.  [facility] 03/10/2021, 12:46 PM

## 2021-03-10 NOTE — Progress Notes (Signed)
Attempted to call report to [facility] x2. At second attempt, message left with secretary to have RN return my call, to receive report.

## 2021-03-10 NOTE — TOC Transition Note (Signed)
Transition of Care [facility]) - CM/SW Discharge Note   Patient Details  Name: Shannon [name] MRN: [id number] Date of Birth: [birth year]  Transition of Care [facility]) CM/SW Contact:  Ida Rogue, LCSW Phone Number: 03/10/2021, 1:53 PM   Clinical Narrative:   Patient who is medically stable for d/c will return to [facility] today.  PTAR arranged.  Nursing, please call report to [phone], ask for [facility] RN station, room 604. TOC sign off.    Final next level of care: Skilled Nursing Facility Barriers to Discharge: No Barriers Identified   Patient Goals and CMS Choice Patient states their goals for this hospitalization and ongoing recovery are:: To feel better CMS Medicare.gov Compare Post Acute Care list provided to:: Patient Choice offered to / list presented to : Patient, Adult Children  Discharge Placement                       Discharge Plan and Services                                     Social Determinants of Health (SDOH) Interventions     Readmission Risk Interventions No flowsheet data found.

## 2021-03-10 NOTE — Plan of Care (Signed)
  Problem: Education: Goal: Knowledge of General Education information will improve Description: Including pain rating scale, medication(s)/side effects and non-pharmacologic comfort measures Outcome: Progressing   Problem: Health Behavior/Discharge Planning: Goal: Ability to manage health-related needs will improve Outcome: Progressing   Problem: Clinical Measurements: Goal: Ability to maintain clinical measurements within normal limits will improve Outcome: Progressing Goal: Will remain free from infection Outcome: Progressing Goal: Diagnostic test results will improve Outcome: Progressing Goal: Cardiovascular complication will be avoided Outcome: Progressing   Problem: Activity: Goal: Risk for activity intolerance will decrease Outcome: Progressing   Problem: Coping: Goal: Level of anxiety will decrease Outcome: Progressing   Problem: Elimination: Goal: Will not experience complications related to bowel motility Outcome: Progressing Goal: Will not experience complications related to urinary retention Outcome: Progressing   Problem: Pain Managment: Goal: General experience of comfort will improve Outcome: Progressing   Problem: Safety: Goal: Ability to remain free from injury will improve Outcome: Progressing   Problem: Skin Integrity: Goal: Risk for impaired skin integrity will decrease Outcome: Progressing   

## 2021-03-16 ENCOUNTER — Encounter: Payer: Self-pay | Admitting: *Deleted

## 2021-03-16 NOTE — Telephone Encounter (Signed)
Transition Care Management Unsuccessful Follow-up Telephone Call  Date of discharge and from where:  [facility]  03/10/2021  Attempts:  1st Attempt  Reason for unsuccessful TCM follow-up call:  No answer/busy  Contact information states pt resides at [facility] and all calls go to daughter Kenner Lewan.  RN care manager outreached daughter to make sure this is correct and verify pt is residing at [facility].  Irving Shows [facility], BSN RN Case Manager [phone]

## 2021-03-17 ENCOUNTER — Emergency Department: Payer: Self-pay

## 2021-05-12 ENCOUNTER — Emergency Department: Payer: Self-pay

## 2021-07-03 ENCOUNTER — Emergency Department (HOSPITAL_COMMUNITY)

## 2021-07-03 ENCOUNTER — Inpatient Hospital Stay (HOSPITAL_COMMUNITY)
Admission: EM | Admit: 2021-07-03 | Discharge: 2021-07-05 | DRG: 698 | Disposition: A | Discharge status: Hospice | Source: Skilled Nursing Facility | Attending: Internal Medicine | Admitting: Internal Medicine

## 2021-07-03 ENCOUNTER — Encounter (HOSPITAL_COMMUNITY): Payer: Self-pay

## 2021-07-03 ENCOUNTER — Other Ambulatory Visit: Payer: Self-pay

## 2021-07-03 DIAGNOSIS — G8250 Quadriplegia, unspecified: Secondary | ICD-10-CM | POA: Diagnosis present

## 2021-07-03 DIAGNOSIS — L8990 Pressure ulcer of unspecified site, unspecified stage: Secondary | ICD-10-CM

## 2021-07-03 DIAGNOSIS — Z87891 Personal history of nicotine dependence: Secondary | ICD-10-CM | POA: Diagnosis not present

## 2021-07-03 DIAGNOSIS — Z880 Allergy status to penicillin: Secondary | ICD-10-CM

## 2021-07-03 DIAGNOSIS — Z888 Allergy status to other drugs, medicaments and biological substances status: Secondary | ICD-10-CM | POA: Diagnosis not present

## 2021-07-03 DIAGNOSIS — Z20822 Contact with and (suspected) exposure to covid-19: Secondary | ICD-10-CM | POA: Diagnosis present

## 2021-07-03 DIAGNOSIS — D638 Anemia in other chronic diseases classified elsewhere: Secondary | ICD-10-CM | POA: Diagnosis present

## 2021-07-03 DIAGNOSIS — Z9079 Acquired absence of other genital organ(s): Secondary | ICD-10-CM

## 2021-07-03 DIAGNOSIS — L89813 Pressure ulcer of head, stage 3: Secondary | ICD-10-CM | POA: Diagnosis present

## 2021-07-03 DIAGNOSIS — Z79899 Other long term (current) drug therapy: Secondary | ICD-10-CM | POA: Diagnosis not present

## 2021-07-03 DIAGNOSIS — Z981 Arthrodesis status: Secondary | ICD-10-CM | POA: Diagnosis not present

## 2021-07-03 DIAGNOSIS — L89154 Pressure ulcer of sacral region, stage 4: Secondary | ICD-10-CM | POA: Diagnosis present

## 2021-07-03 DIAGNOSIS — Z7982 Long term (current) use of aspirin: Secondary | ICD-10-CM | POA: Diagnosis not present

## 2021-07-03 DIAGNOSIS — G9341 Metabolic encephalopathy: Secondary | ICD-10-CM

## 2021-07-03 DIAGNOSIS — I10 Essential (primary) hypertension: Secondary | ICD-10-CM | POA: Diagnosis present

## 2021-07-03 DIAGNOSIS — R1310 Dysphagia, unspecified: Secondary | ICD-10-CM | POA: Diagnosis present

## 2021-07-03 DIAGNOSIS — Z931 Gastrostomy status: Secondary | ICD-10-CM

## 2021-07-03 DIAGNOSIS — Z431 Encounter for attention to gastrostomy: Secondary | ICD-10-CM

## 2021-07-03 DIAGNOSIS — M1990 Unspecified osteoarthritis, unspecified site: Secondary | ICD-10-CM | POA: Diagnosis present

## 2021-07-03 DIAGNOSIS — Y846 Urinary catheterization as the cause of abnormal reaction of the patient, or of later complication, without mention of misadventure at the time of the procedure: Secondary | ICD-10-CM | POA: Diagnosis present

## 2021-07-03 DIAGNOSIS — D649 Anemia, unspecified: Secondary | ICD-10-CM

## 2021-07-03 DIAGNOSIS — E871 Hypo-osmolality and hyponatremia: Secondary | ICD-10-CM | POA: Diagnosis present

## 2021-07-03 DIAGNOSIS — N390 Urinary tract infection, site not specified: Secondary | ICD-10-CM | POA: Diagnosis present

## 2021-07-03 DIAGNOSIS — T85528A Displacement of other gastrointestinal prosthetic devices, implants and grafts, initial encounter: Secondary | ICD-10-CM

## 2021-07-03 DIAGNOSIS — L89890 Pressure ulcer of other site, unstageable: Secondary | ICD-10-CM | POA: Diagnosis present

## 2021-07-03 DIAGNOSIS — T83511A Infection and inflammatory reaction due to indwelling urethral catheter, initial encounter: Principal | ICD-10-CM | POA: Diagnosis present

## 2021-07-03 DIAGNOSIS — Z466 Encounter for fitting and adjustment of urinary device: Secondary | ICD-10-CM

## 2021-07-03 LAB — CBC WITH DIFFERENTIAL/PLATELET
Abs Immature Granulocytes: 0.05 10*3/uL (ref 0.00–0.07)
Basophils Absolute: 0 10*3/uL (ref 0.0–0.1)
Basophils Relative: 1 %
Eosinophils Absolute: 0.8 10*3/uL — ABNORMAL HIGH (ref 0.0–0.5)
Eosinophils Relative: 11 %
HCT: 29.2 % — ABNORMAL LOW (ref 39.0–52.0)
Hemoglobin: 8.3 g/dL — ABNORMAL LOW (ref 13.0–17.0)
Immature Granulocytes: 1 %
Lymphocytes Relative: 15 %
Lymphs Abs: 1.1 10*3/uL (ref 0.7–4.0)
MCH: 23.6 pg — ABNORMAL LOW (ref 26.0–34.0)
MCHC: 28.4 g/dL — ABNORMAL LOW (ref 30.0–36.0)
MCV: 83 fL (ref 80.0–100.0)
Monocytes Absolute: 0.7 10*3/uL (ref 0.1–1.0)
Monocytes Relative: 10 %
Neutro Abs: 4.7 10*3/uL (ref 1.7–7.7)
Neutrophils Relative %: 62 %
Platelets: 269 10*3/uL (ref 150–400)
RBC: 3.52 MIL/uL — ABNORMAL LOW (ref 4.22–5.81)
RDW: 18.5 % — ABNORMAL HIGH (ref 11.5–15.5)
WBC: 7.4 10*3/uL (ref 4.0–10.5)
nRBC: 0 % (ref 0.0–0.2)

## 2021-07-03 LAB — RESP PANEL BY RT-PCR (FLU A&B, COVID) ARPGX2
Influenza A by PCR: NEGATIVE
Influenza B by PCR: NEGATIVE
SARS Coronavirus 2 by RT PCR: NEGATIVE

## 2021-07-03 LAB — COMPREHENSIVE METABOLIC PANEL
ALT: 14 U/L (ref 0–44)
AST: 22 U/L (ref 15–41)
Albumin: <1.5 g/dL — ABNORMAL LOW (ref 3.5–5.0)
Alkaline Phosphatase: 96 U/L (ref 38–126)
Anion gap: 6 (ref 5–15)
BUN: 19 mg/dL (ref 8–23)
CO2: 29 mmol/L (ref 22–32)
Calcium: 8.3 mg/dL — ABNORMAL LOW (ref 8.9–10.3)
Chloride: 94 mmol/L — ABNORMAL LOW (ref 98–111)
Creatinine, Ser: 0.45 mg/dL — ABNORMAL LOW (ref 0.61–1.24)
GFR, Estimated: >60 mL/min (ref >60)
Glucose, Bld: 134 mg/dL — ABNORMAL HIGH (ref 70–99)
Potassium: 3.9 mmol/L (ref 3.5–5.1)
Sodium: 129 mmol/L — ABNORMAL LOW (ref 135–145)
Total Bilirubin: 0.5 mg/dL (ref 0.3–1.2)
Total Protein: 10.5 g/dL — ABNORMAL HIGH (ref 6.5–8.1)

## 2021-07-03 LAB — URINALYSIS, ROUTINE W REFLEX MICROSCOPIC
Bilirubin Urine: NEGATIVE
Glucose, UA: NEGATIVE mg/dL
Ketones, ur: NEGATIVE mg/dL
Nitrite: NEGATIVE
Protein, ur: 30 mg/dL — AB
RBC / HPF: >50 RBC/hpf — ABNORMAL HIGH (ref 0–5)
Specific Gravity, Urine: 1.013 (ref 1.005–1.030)
WBC, UA: >50 WBC/hpf — ABNORMAL HIGH (ref 0–5)
pH: 8 (ref 5.0–8.0)

## 2021-07-03 LAB — CBG MONITORING, ED: Glucose-Capillary: 146 mg/dL — ABNORMAL HIGH (ref 70–99)

## 2021-07-03 LAB — LACTIC ACID, PLASMA
Lactic Acid, Venous: 1.1 mmol/L (ref 0.5–1.9)
Lactic Acid, Venous: 1.2 mmol/L (ref 0.5–1.9)

## 2021-07-03 IMAGING — DX DG CHEST 1V PORT
1 series · 1 of 1 positions shown · non-contrast
Comparison: Chest x-ray [DATE].

CLINICAL DATA: 75-year-old male with history of altered level of
consciousness.

EXAM:
PORTABLE CHEST 1 VIEW

[chest ap]
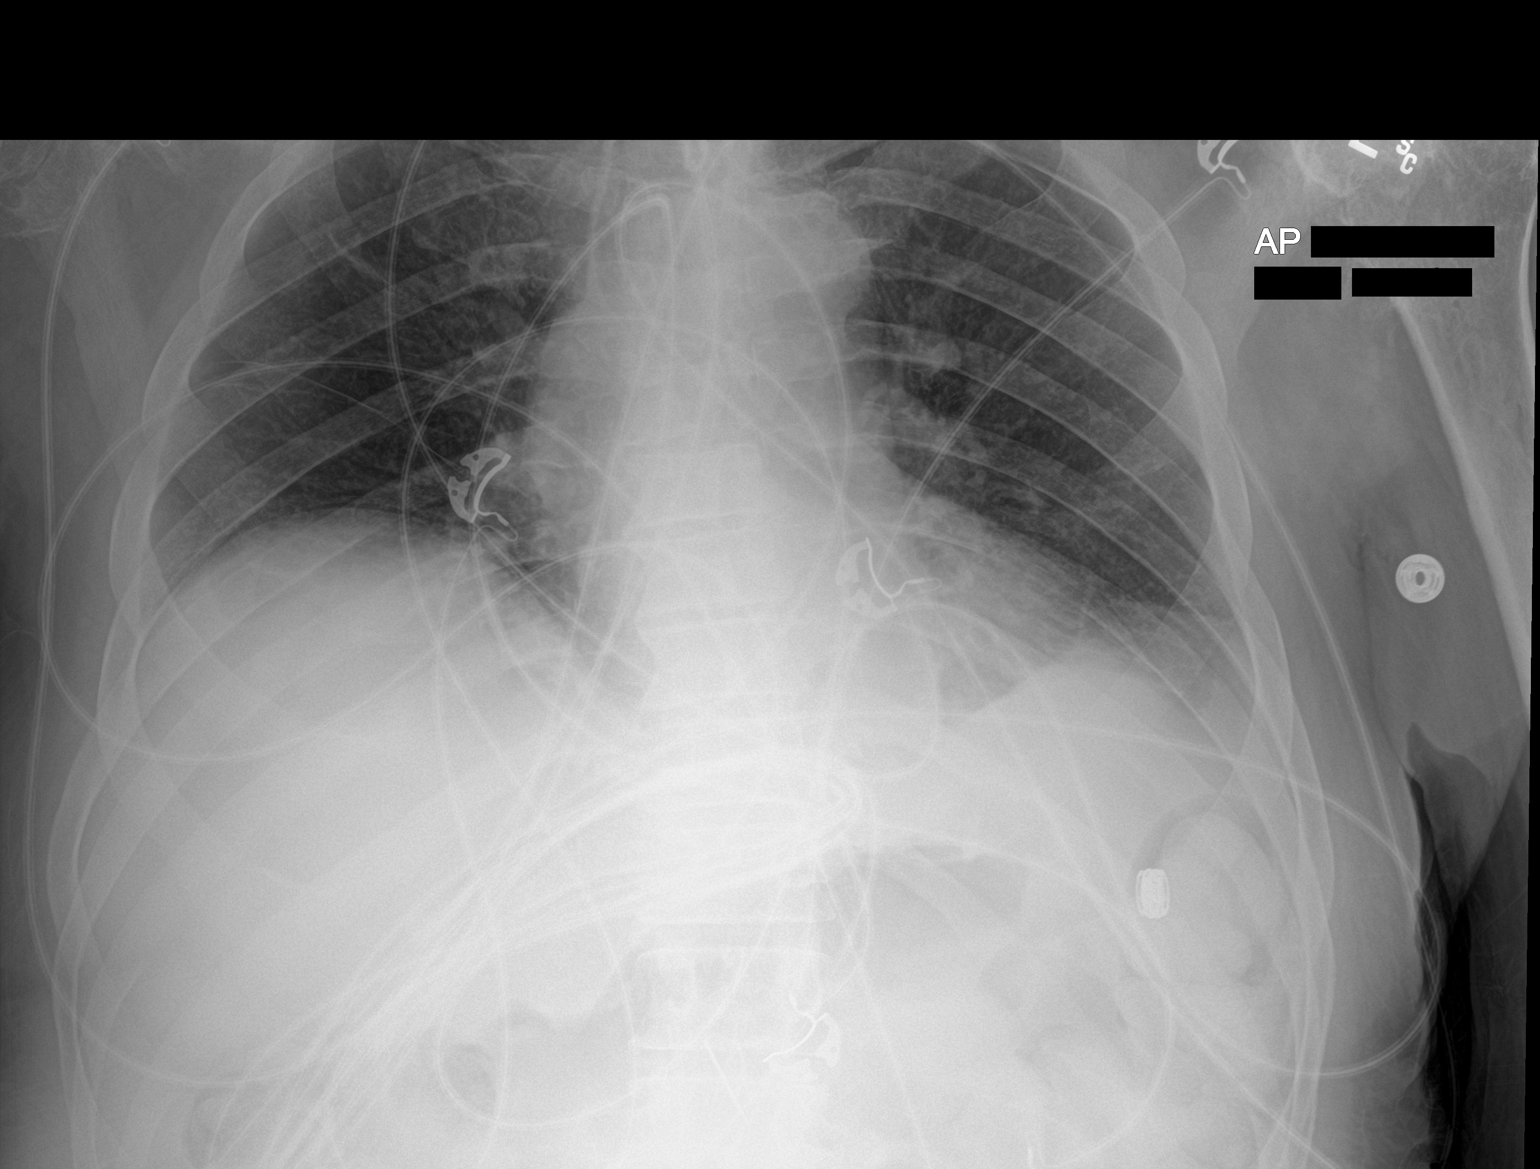

[1 of 1 positions shown; findings below may reference images not displayed]

FINDINGS: Lung volumes are low. No consolidative airspace disease. No pleural
effusions. No pneumothorax. No pulmonary nodule or mass noted.
Pulmonary vasculature and the cardiomediastinal silhouette are
within normal limits.
IMPRESSION: 1. Low lung volumes without radiographic evidence of acute
cardiopulmonary disease.

## 2021-07-03 IMAGING — CR DG ABDOMEN 1V
1 series · 1 of 1 positions shown · non-contrast
Comparison: Intraprocedural fluoroscopic images from gastrostomy
tube placement [DATE]. CT abdomen/pelvis [DATE].

CLINICAL DATA: Provided history: Status post insertion of
percutaneous endoscopic gastrostomy (PEG) tube. Additional history
provided: 30 mL contrast injected via gastric tube.

EXAM:
ABDOMEN - 1 VIEW

[x abdomen supine]
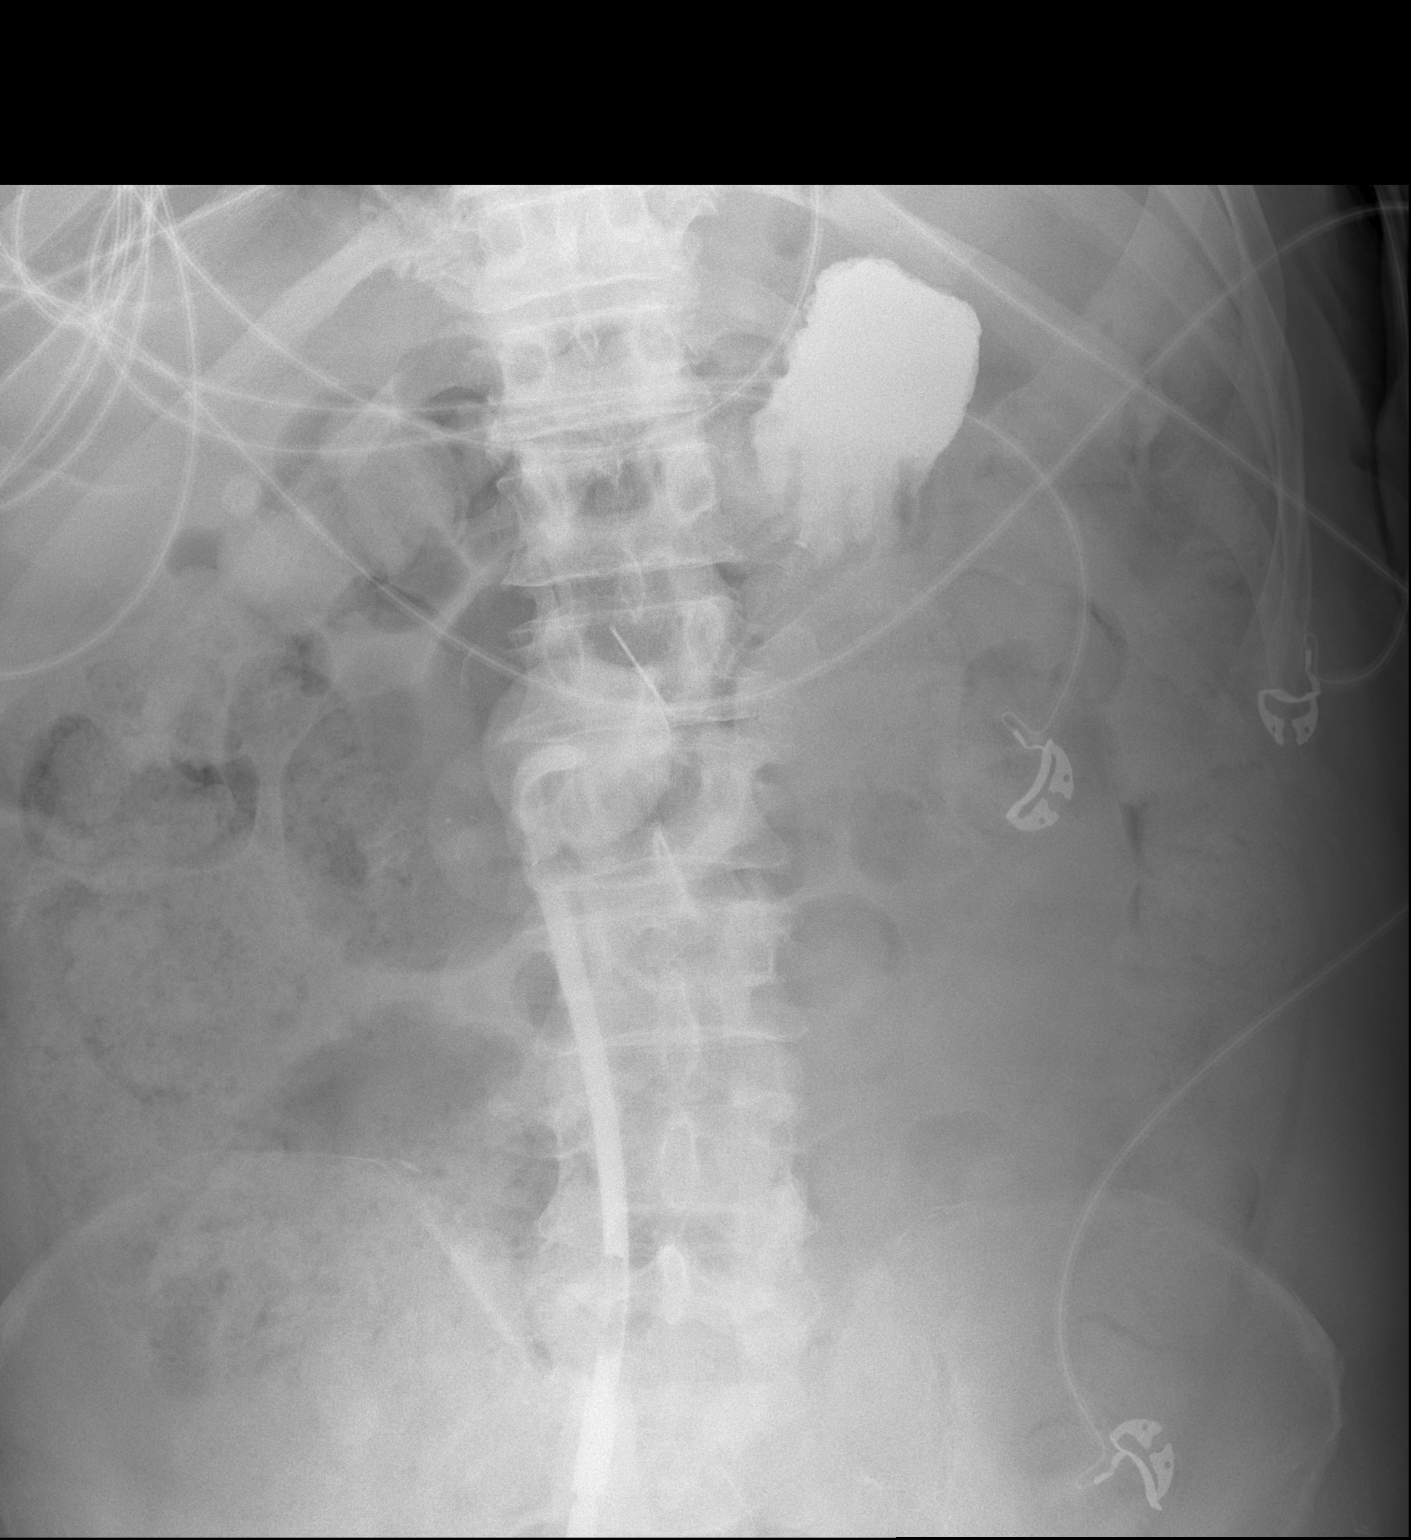

[1 of 1 positions shown; findings below may reference images not displayed]

FINDINGS: A gastrostomy tube projects over the stomach. There is contrast
opacification of the gastric fundus. No extraluminal contrast is
identified. No dilated loops of small bowel are identified within
the visualized abdomen. Moderate to large colonic stool burden
within the visible abdomen suggesting constipation.
IMPRESSION: A gastrostomy tube projects over the stomach.

Contrast opacification of the gastric fundus. No extraluminal
contrast is identified.

Moderate-to-large colonic stool burden within the visible abdomen,
suggesting constipation.

## 2021-07-03 MED ORDER — SACCHAROMYCES BOULARDII 250 MG PO CAPS
250.0000 mg | ORAL_CAPSULE | Freq: Every day | ORAL | Status: DC
  Administered 2021-07-04 – 2021-07-05 (×2): 250 mg
  Filled 2021-07-03 (×2): qty 1

## 2021-07-03 MED ORDER — SODIUM CHLORIDE 0.9 % IV SOLN
1.0000 g | Freq: Once | INTRAVENOUS | Status: AC
Start: 2021-07-03 — End: 2021-07-03
  Administered 2021-07-03: 1 g via INTRAVENOUS
  Filled 2021-07-03: qty 10

## 2021-07-03 MED ORDER — ACETAMINOPHEN 650 MG RE SUPP: 650.0000 mg | Freq: Four times a day (QID) | RECTAL | Status: DC | PRN

## 2021-07-03 MED ORDER — ONDANSETRON HCL 4 MG/2ML IJ SOLN: 4.0000 mg | Freq: Four times a day (QID) | INTRAMUSCULAR | Status: DC | PRN

## 2021-07-03 MED ORDER — FINASTERIDE 5 MG PO TABS
5.0000 mg | ORAL_TABLET | Freq: Every day | ORAL | Status: DC
  Administered 2021-07-04 – 2021-07-05 (×2): 5 mg via ORAL
  Filled 2021-07-03 (×2): qty 1

## 2021-07-03 MED ORDER — ACETAMINOPHEN 325 MG PO TABS: 650.0000 mg | ORAL_TABLET | Freq: Four times a day (QID) | ORAL | Status: DC | PRN

## 2021-07-03 MED ORDER — PROSOURCE TF PO LIQD
45.0000 mL | Freq: Every day | ORAL | Status: DC
  Administered 2021-07-03 – 2021-07-04 (×2): 45 mL
  Filled 2021-07-03 (×2): qty 45

## 2021-07-03 MED ORDER — TAMSULOSIN HCL 0.4 MG PO CAPS
0.4000 mg | ORAL_CAPSULE | Freq: Every day | ORAL | Status: DC
  Administered 2021-07-04 – 2021-07-05 (×2): 0.4 mg via ORAL
  Filled 2021-07-03 (×2): qty 1

## 2021-07-03 MED ORDER — DULOXETINE HCL 20 MG PO CPEP
20.0000 mg | ORAL_CAPSULE | Freq: Every day | ORAL | Status: DC
  Administered 2021-07-03 – 2021-07-04 (×2): 20 mg via ORAL
  Filled 2021-07-03 (×2): qty 1

## 2021-07-03 MED ORDER — FERROUS SULFATE 300 (60 FE) MG/5ML PO SYRP
220.0000 mg | ORAL_SOLUTION | Freq: Two times a day (BID) | ORAL | Status: DC
  Administered 2021-07-03 – 2021-07-05 (×4): 220 mg
  Filled 2021-07-03 (×4): qty 5

## 2021-07-03 MED ORDER — IOHEXOL 300 MG/ML  SOLN
100.0000 mL | Freq: Once | INTRAMUSCULAR | Status: AC | PRN
  Administered 2021-07-03: 30 mL

## 2021-07-03 MED ORDER — ACETAMINOPHEN 160 MG/5ML PO SOLN: 650.0000 mg | Freq: Four times a day (QID) | ORAL | Status: DC | PRN

## 2021-07-03 MED ORDER — DOCUSATE SODIUM 50 MG/5ML PO LIQD
100.0000 mg | Freq: Two times a day (BID) | ORAL | Status: DC
  Administered 2021-07-03 – 2021-07-05 (×4): 100 mg
  Filled 2021-07-03 (×4): qty 10

## 2021-07-03 MED ORDER — FREE WATER
100.0000 mL | Status: DC
  Administered 2021-07-03 – 2021-07-05 (×12): 100 mL

## 2021-07-03 MED ORDER — ONDANSETRON HCL 4 MG PO TABS: 4.0000 mg | ORAL_TABLET | Freq: Four times a day (QID) | ORAL | Status: DC | PRN

## 2021-07-03 MED ORDER — POLYETHYLENE GLYCOL 3350 17 G PO PACK
17.0000 g | PACK | Freq: Every day | ORAL | Status: DC
  Administered 2021-07-03 – 2021-07-05 (×3): 17 g
  Filled 2021-07-03 (×3): qty 1

## 2021-07-03 MED ORDER — NYSTATIN 100000 UNIT/GM EX POWD
1.0000 "application " | Freq: Every day | CUTANEOUS | Status: DC
  Administered 2021-07-03 – 2021-07-04 (×2): 1 via TOPICAL
  Filled 2021-07-03: qty 15

## 2021-07-03 MED ORDER — SODIUM CHLORIDE 0.9 % IV BOLUS
1000.0000 mL | Freq: Once | INTRAVENOUS | Status: AC
  Administered 2021-07-03: 1000 mL via INTRAVENOUS

## 2021-07-03 MED ORDER — SCOPOLAMINE 1 MG/3DAYS TD PT72
1.0000 | MEDICATED_PATCH | TRANSDERMAL | Status: DC
  Administered 2021-07-03: 1.5 mg via TRANSDERMAL
  Filled 2021-07-03: qty 1

## 2021-07-03 MED ORDER — ALBUTEROL SULFATE (2.5 MG/3ML) 0.083% IN NEBU
2.5000 mg | INHALATION_SOLUTION | Freq: Two times a day (BID) | RESPIRATORY_TRACT | Status: DC
  Administered 2021-07-03 – 2021-07-04 (×3): 2.5 mg via RESPIRATORY_TRACT
  Filled 2021-07-03 (×3): qty 3

## 2021-07-03 MED ORDER — ASPIRIN 81 MG PO CHEW
81.0000 mg | CHEWABLE_TABLET | Freq: Every morning | ORAL | Status: DC
  Administered 2021-07-04 – 2021-07-05 (×2): 81 mg
  Filled 2021-07-03 (×2): qty 1

## 2021-07-03 MED ORDER — SODIUM CHLORIDE 0.9 % IV SOLN
1.0000 g | INTRAVENOUS | Status: DC
  Administered 2021-07-04: 1 g via INTRAVENOUS
  Filled 2021-07-03 (×2): qty 10

## 2021-07-03 MED ORDER — MIDODRINE HCL 5 MG PO TABS
10.0000 mg | ORAL_TABLET | Freq: Three times a day (TID) | ORAL | Status: DC
  Administered 2021-07-03 – 2021-07-05 (×6): 10 mg
  Filled 2021-07-03 (×7): qty 2

## 2021-07-03 MED ORDER — JUVEN PO PACK
1.0000 | PACK | Freq: Two times a day (BID) | ORAL | Status: DC
  Administered 2021-07-03 – 2021-07-04 (×2): 1
  Filled 2021-07-03 (×3): qty 1

## 2021-07-03 MED ORDER — GUAIFENESIN 100 MG/5ML PO LIQD
100.0000 mg | Freq: Two times a day (BID) | ORAL | Status: DC
  Administered 2021-07-03 – 2021-07-05 (×4): 100 mg
  Filled 2021-07-03 (×4): qty 10

## 2021-07-03 MED ORDER — OXYCODONE HCL 5 MG PO TABS
5.0000 mg | ORAL_TABLET | Freq: Four times a day (QID) | ORAL | Status: DC | PRN
  Administered 2021-07-05: 5 mg
  Filled 2021-07-03: qty 1

## 2021-07-03 MED ORDER — ALBUTEROL SULFATE HFA 108 (90 BASE) MCG/ACT IN AERS
2.0000 | INHALATION_SPRAY | Freq: Two times a day (BID) | RESPIRATORY_TRACT | Status: DC
  Filled 2021-07-03: qty 6.7

## 2021-07-03 NOTE — ED Provider Notes (Signed)
[facility] DEPT Provider Note   CSN: [id number] Arrival date & time: 07/03/21  1133     History  Chief Complaint  Patient presents with   Altered Mental Status    Shannon [name] is a 76 y.o. male.  Pt is a 76 yo black male with a hx of quadriplegia, Chiari malformation type I, hypertension, osteoarthritis, stage IV sacral decubitus ulcer, chronic indwelling catheter, and asthma.  Pt is supposed to be on hospice and DNR, but pt's daughter wants everything done.  Pt has a feeding tube which has fallen out.  It is unclear when it fell out.  She wants it put back in although his MOST form said no feeding tube.  She also said he is more altered than normal and wants him evaluated for that.  He is unable to give any hx.      Home Medications Prior to Admission medications   Medication Sig Start Date End Date Taking? Authorizing Provider  Acidophilus Lactobacillus CAPS Place 1 capsule into feeding tube in the morning.   Yes [provider]  albuterol (VENTOLIN HFA) 108 (90 Base) MCG/ACT inhaler Inhale 2 puffs into the lungs 2 (two) times daily.   Yes [provider]  Amino Acids-Protein Hydrolys (PRO-STAT AWC) LIQD Give 30 mLs by tube daily.   Yes [provider]  ascorbic acid (VITAMIN C) 500 MG tablet Place 1,000 mg into feeding tube daily.   Yes [provider]  Cranberry 400 MG CAPS Place 400 mg into feeding tube in the morning.   Yes [provider]  docusate (COLACE) 50 MG/5ML liquid Place 100 mg into feeding tube 2 (two) times daily.   Yes [provider]  DULoxetine HCl 20 MG CSDR 20 mg at bedtime. per G-Tube,   Yes [provider]  Ferrous Sulfate 220 (44 Fe) MG/5ML LIQD Give 220 mg by tube 2 (two) times daily. Via G tube   Yes [provider]  finasteride (PROSCAR) 5 MG tablet Take 1 tablet (5 mg total) by mouth daily. Per tube 03/11/21  Yes Eugenie Filler, MD  gabapentin  (NEURONTIN) 300 MG capsule Take 300 mg by mouth 2 (two) times daily. * do not crush* 07/02/21  Yes [provider]  guaiFENesin (ROBITUSSIN) 100 MG/5ML liquid Place 100 mg into feeding tube 2 (two) times daily.   Yes [provider]  midodrine (PROAMATINE) 10 MG tablet Place 1 tablet (10 mg total) into feeding tube 3 (three) times daily. 11/18/20  Yes Eugenie Filler, MD  nystatin (MYCOSTATIN/NYSTOP) powder Apply 1 application topically See admin instructions. Apply topically once a day to sacrum peri-wound and surrounding skin- during wound dressing change   Yes [provider]  omeprazole (PRILOSEC) 20 MG capsule Take 1 capsule (20 mg total) by mouth 2 (two) times daily before a meal. 20 mg, via G-Tube, 2 times daily x 4 weeks, then daily. 03/10/21  Yes Eugenie Filler, MD  oxyCODONE (OXY IR/ROXICODONE) 5 MG immediate release tablet Take 5 mg by mouth every 2 (two) hours as needed for pain. 06/19/21  Yes [provider]  scopolamine (TRANSDERM-SCOP) 1 MG/3DAYS Place 1 patch onto the skin every 3 (three) days.   Yes [provider]  tamsulosin (FLOMAX) 0.4 MG CAPS capsule Take 1 capsule (0.4 mg total) by mouth daily. Patient taking differently: 0.4 mg See admin instructions. 0.4 mg per G-Tube every morning 04/28/20  Yes Newman Pies, MD  XTAMPZA ER 9 MG  C12A Take 1 capsule by mouth 2 (two) times daily. 06/22/21  Yes [provider]  zinc gluconate 50 MG tablet Place 50 mg into feeding tube daily.   Yes [provider]  aspirin 81 MG chewable tablet Place 1 tablet (81 mg total) into feeding tube every morning. 03/17/21   Eugenie Filler, MD  nutrition supplement, JUVEN, Fanny Dance) PACK Place 1 packet into feeding tube 2 (two) times daily between meals. Patient taking differently: Place 1 packet into feeding tube See admin instructions. Mix the contents of 1 packet into 6 ounces of fluid and place into tube 2 times a day between meals  11/18/20   Eugenie Filler, MD  Nutritional Supplements (FEEDING SUPPLEMENT, OSMOLITE 1.5 CAL,) LIQD Place 1,000 mLs into feeding tube continuous. Patient taking differently: Place 75 mL/hr into feeding tube See admin instructions. 75 ml's/hr, via gastric tube, for 22 hours with a break of 2 hours for personal care 11/18/20   Eugenie Filler, MD  polyethylene glycol (MIRALAX / GLYCOLAX) 17 g packet Place 17 g into feeding tube See admin instructions. Mix 17 grams of powder into 8 ounces of water and place into G-Tube once a day    [provider]  Water For Irrigation, Sterile (FREE WATER) SOLN Place 100 mLs into feeding tube every 4 (four) hours. 03/10/21   Eugenie Filler, MD      Allergies    Iodine and Penicillins    Review of Systems   Review of Systems  Unable to perform ROS: Mental status change  All other systems reviewed and are negative.  Physical Exam Updated Vital Signs BP 128/81    Pulse (!) 106    Temp 98.6 F (37 C) (Oral)    Resp 17    Ht 6' (1.829 m)    Wt 83.9 kg    SpO2 96%    BMI 25.09 kg/m  Physical Exam Vitals and nursing note reviewed.  Constitutional:      General: He is in acute distress.     Appearance: He is ill-appearing.  HENT:     Head: Normocephalic.     Comments: Large decub ulcer to posterior head    Right Ear: External ear normal.     Left Ear: External ear normal.     Nose: Nose normal.     Mouth/Throat:     Mouth: Mucous membranes are dry.  Eyes:     Extraocular Movements: Extraocular movements intact.     Conjunctiva/sclera: Conjunctivae normal.     Pupils: Pupils are equal, round, and reactive to light.  Cardiovascular:     Rate and Rhythm: Normal rate and regular rhythm.     Pulses: Normal pulses.     Heart sounds: Normal heart sounds.  Pulmonary:     Effort: Pulmonary effort is normal.     Breath sounds: Normal breath sounds.  Abdominal:     General: Abdomen is flat. Bowel sounds are normal.     Palpations: Abdomen  is soft.     Comments: G tube out  Musculoskeletal:     Cervical back: Normal range of motion and neck supple.     Comments: Multiple contractures  Skin:    Capillary Refill: Capillary refill takes less than 2 seconds.     Comments: Multiple bed sores  Neurological:     Comments: Oriented to 1 quadriplegia  Psychiatric:        Mood and Affect: Mood normal.    ED Results /  Procedures / Treatments   Labs (all labs ordered are listed, but only abnormal results are displayed) Labs Reviewed  CBC WITH DIFFERENTIAL/PLATELET - Abnormal; Notable for the following components:      Result Value   RBC 3.52 (*)    Hemoglobin 8.3 (*)    HCT 29.2 (*)    MCH 23.6 (*)    MCHC 28.4 (*)    RDW 18.5 (*)    Eosinophils Absolute 0.8 (*)    All other components within normal limits  COMPREHENSIVE METABOLIC PANEL - Abnormal; Notable for the following components:   Sodium 129 (*)    Chloride 94 (*)    Glucose, Bld 134 (*)    Creatinine, Ser 0.45 (*)    Calcium 8.3 (*)    Total Protein 10.5 (*)    Albumin <1.5 (*)    All other components within normal limits  URINALYSIS, ROUTINE W REFLEX MICROSCOPIC - Abnormal; Notable for the following components:   APPearance TURBID (*)    Hgb urine dipstick MODERATE (*)    Protein, ur 30 (*)    Leukocytes,Ua LARGE (*)    RBC / HPF >50 (*)    WBC, UA >50 (*)    Bacteria, UA MANY (*)    All other components within normal limits  CBG MONITORING, ED - Abnormal; Notable for the following components:   Glucose-Capillary 146 (*)    All other components within normal limits  RESP PANEL BY RT-PCR (FLU A&B, COVID) ARPGX2  URINE CULTURE  CULTURE, BLOOD (ROUTINE X 2)  CULTURE, BLOOD (ROUTINE X 2)  LACTIC ACID, PLASMA  LACTIC ACID, PLASMA    EKG None  Radiology DG ABDOMEN PEG TUBE LOCATION  Result Date: 07/03/2021 CLINICAL DATA:  Provided history: Status post insertion of percutaneous endoscopic gastrostomy (PEG) tube. Additional history provided: 30 mL  contrast injected via gastric tube. EXAM: ABDOMEN - 1 VIEW COMPARISON:  Intraprocedural fluoroscopic images from gastrostomy tube placement 01/19/2021. CT abdomen/pelvis 12/08/2020. FINDINGS: A gastrostomy tube projects over the stomach. There is contrast opacification of the gastric fundus. No extraluminal contrast is identified. No dilated loops of small bowel are identified within the visualized abdomen. Moderate to large colonic stool burden within the visible abdomen suggesting constipation. IMPRESSION: A gastrostomy tube projects over the stomach. Contrast opacification of the gastric fundus. No extraluminal contrast is identified. Moderate-to-large colonic stool burden within the visible abdomen, suggesting constipation. Electronically Signed   By: Kellie Simmering D.O.   On: 07/03/2021 14:04   DG Chest Portable 1 View  Result Date: 07/03/2021 CLINICAL DATA:  76 year old male with history of altered level of consciousness. EXAM: PORTABLE CHEST 1 VIEW COMPARISON:  Chest x-ray 12/09/2020. FINDINGS: Lung volumes are low. No consolidative airspace disease. No pleural effusions. No pneumothorax. No pulmonary nodule or mass noted. Pulmonary vasculature and the cardiomediastinal silhouette are within normal limits. IMPRESSION: 1. Low lung volumes without radiographic evidence of acute cardiopulmonary disease. Electronically Signed   By: Vinnie Langton M.D.   On: 07/03/2021 12:17    Procedures Gastrostomy tube replacement  Date/Time: 07/03/2021 2:15 PM Performed by: Isla Pence, MD Authorized by: Isla Pence, MD  Consent: Verbal consent obtained. Risks and benefits: risks, benefits and alternatives were discussed Consent given by: power of attorney Time out: Immediately prior to procedure a "time out" was called to verify the correct patient, procedure, equipment, support staff and site/side marked as required. Preparation: Patient was prepped and draped in the usual sterile fashion. Local  anesthesia used: no  Anesthesia:  Local anesthesia used: no  Sedation: Patient sedated: no  Patient tolerance: patient tolerated the procedure well with no immediate complications Comments: 20 F g-tube replaced.      Medications Ordered in ED Medications  sodium chloride 0.9 % bolus 1,000 mL (0 mLs Intravenous Stopped 07/03/21 1402)  iohexol (OMNIPAQUE) 300 MG/ML solution 100 mL (30 mLs Per Tube Contrast Given 07/03/21 1357)  cefTRIAXone (ROCEPHIN) 1 g in sodium chloride 0.9 % 100 mL IVPB (1 g Intravenous New Bag/Given 07/03/21 1424)    ED Course/ Medical Decision Making/ A&P                           Medical Decision Making  Pt's daughter is here.  She wants everything done for her father even though patient has a DNR form and has been on hospice and palliative care at the facility.  She does want pt's g tube to be replaced.  She does want lab work and urine to be done.    Labs and X-rays reviewed.  Pt has hyponatremia and chronic anemia.  Pt's foley changed and urine sent.  Ua +.  Urine sent for culture.  Pt started on Rocephin.  Pt's daughter does want pt to be admitted.  She seems very unrealistic about pt's outcome.    Pt d/w Dr. Marylyn Ishihara ([facility]) for admission.  CRITICAL CARE Performed by: Isla Pence   Total critical care time: 30 minutes  Critical care time was exclusive of separately billable procedures and treating other patients.  Critical care was necessary to treat or prevent imminent or life-threatening deterioration.  Critical care was time spent personally by me on the following activities: development of treatment plan with patient and/or surrogate as well as nursing, discussions with consultants, evaluation of patient's response to treatment, examination of patient, obtaining history from patient or surrogate, ordering and performing treatments and interventions, ordering and review of laboratory studies, ordering and review of radiographic studies, pulse  oximetry and re-evaluation of patient's condition.         Final Clinical Impression(s) / ED Diagnoses Final diagnoses:  Encounter for Foley catheter replacement  Urinary tract infection associated with indwelling urethral catheter, initial encounter ([place])  Hyponatremia  Chronic anemia  Acute metabolic encephalopathy  Dislodged gastrostomy tube  Pressure injury of skin, unspecified injury stage, unspecified location    Rx / DC Orders ED Discharge Orders     None         Isla Pence, MD 07/03/21 1524

## 2021-07-03 NOTE — H&P (Signed)
History and Physical    Shannon [name] [id number] DOB: 1946/06/09 DOA: 07/03/2021  PCP: Tammi Sou, MD  Patient coming from: SNF  Chief Complaint: altered mental status  HPI: Shannon [name] is a 76 y.o. male with medical history significant of HTN, chronic indwelling catheter, asthma, quadriplegia, Chiari malformation type I, HTN. History is from daughter. She reports that the patient's sore on his head and neck have worsened in the facility. She reports that his PEG fell out. She reports that he does not seem like himself and that he is less talkative. She requested transfer to the ED for evaluation.   ED Course: The daughter informed the EDP that although his is supposed to be hospice and DNR, she wanted everything done for him. She wanted the foley and G-tube replaced. EDP noted him to have a UTI. He was started on rocephin. [facility] was called for admission.   Review of Systems:  Unable to obtain d/t mentation.   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 Sherwood Gambler, 1992-->surg at [facility].  Summer 2021->Cervicalgia,arm pain, hand atrophy RUE wkness, hyperreflex (Dr. Rosalita Levan MRI: extensive cord atrophy and spinal and foraminal stenosis->to get surgery 03/2020   Hay fever    Hematuria    Hypertension    Osteoarthritis of both hands     PSHx Past Surgical History:  Procedure Laterality Date   ANTERIOR CERVICAL DECOMP/DISCECTOMY FUSION N/A 04/05/2020   Procedure: ANTERIOR CERVICAL DECOMPRESSION/DISCECTOMY FUSION, INTERBODY PROSTHESIS, PLATE/SCREWS CERVICAL THREE-CERVICAL FOUR, CERVICAL FOUR- CERVICAL FIVE;  Surgeon: Newman Pies, MD;  Location: [place];  Service: Neurosurgery;  Laterality: N/A;   ANTERIOR CERVICAL DECOMP/DISCECTOMY FUSION N/A 04/22/2020   Procedure: Reexploration of anterior cervical wound for epidural hematoma;   Surgeon: Kary Kos, MD;  Location: [place];  Service: Neurosurgery;  Laterality: N/A;   BACK SURGERY     CYSTOSCOPY N/A 11/13/2020   Procedure: CYSTOSCOPY, CLOT EVACUATION, FULGURATION;  Surgeon: Robley Fries, MD;  Location: [facility];  Service: Urology;  Laterality: N/A;   INCISION AND DRAINAGE Left 2012   L hand infection   IR GASTROSTOMY TUBE MOD SED  07/05/2020   IR REPLC GASTRO/COLONIC TUBE PERCUT W/FLUORO  01/19/2021   ROTATOR CUFF REPAIR Right    x 3   SPINE SURGERY     TRANSURETHRAL RESECTION OF PROSTATE N/A 03/06/2021   Procedure: CYSTOSCOPY, FULGURATION OF PROSTATE, CLOT EVACUATION;  Surgeon: Lucas Mallow, MD;  Location: [facility];  Service: Urology;  Laterality: N/A;    SocHx  reports that he has quit smoking. He has never used smokeless tobacco. He reports that he does not currently use alcohol. He reports that he does not use drugs.  Allergies  Allergen Reactions   Iodine Swelling and Other (See Comments)    "NOT CT CONTRAST" "Allergic," per [facility]   Penicillins Itching, Swelling and Other (See Comments)    "** tolerates cephalosporins; facial swelling, itchy throat" "Allergic," per [facility]    FamHx Family History  Problem Relation Age of Onset   Heart attack Mother    Heart disease Mother    High blood pressure Mother    Alcohol abuse Father    Cancer Father    Diabetes Father    High blood pressure Father     Prior to Admission medications   Medication Sig Start Date End Date Taking?  Authorizing Provider  Acidophilus Lactobacillus CAPS Place 1 capsule into feeding tube in the morning.    [provider]  albuterol (VENTOLIN HFA) 108 (90 Base) MCG/ACT inhaler Inhale 2 puffs into the lungs 2 (two) times daily.    [provider]  Amino Acids-Protein Hydrolys (PRO-STAT) LIQD Place 30 mLs into feeding tube daily.    [provider]  ascorbic acid (VITAMIN C) 500 MG tablet Place 1,000 mg into feeding tube daily.    [provider]   aspirin 81 MG chewable tablet Place 1 tablet (81 mg total) into feeding tube every morning. 03/17/21   Eugenie Filler, MD  Cranberry 400 MG CAPS Place 400 mg into feeding tube in the morning.    [provider]  docusate (COLACE) 50 MG/5ML liquid Place 100 mg into feeding tube 2 (two) times daily.    [provider]  DULoxetine (CYMBALTA) 30 MG capsule 30 mg See admin instructions. 30 ,g, per G-Tube, every morning    [provider]  Ferrous Sulfate 220 (44 Fe) MG/5ML LIQD Give 220 mg by tube in the morning, at noon, and at bedtime. Via G tube    [provider]  finasteride (PROSCAR) 5 MG tablet Take 1 tablet (5 mg total) by mouth daily. Per tube 03/11/21   Eugenie Filler, MD  guaiFENesin (ROBITUSSIN) 100 MG/5ML liquid Place 100 mg into feeding tube 2 (two) times daily.    [provider]  midodrine (PROAMATINE) 10 MG tablet Place 1 tablet (10 mg total) into feeding tube 3 (three) times daily. 11/18/20   Eugenie Filler, MD  nutrition supplement, JUVEN, Fanny Dance) PACK Place 1 packet into feeding tube 2 (two) times daily between meals. Patient taking differently: Place 1 packet into feeding tube See admin instructions. Mix the contents of 1 packet into 6 ounces of fluid and place into tube 2 times a day between meals 11/18/20   Eugenie Filler, MD  Nutritional Supplements (FEEDING SUPPLEMENT, OSMOLITE 1.5 CAL,) LIQD Place 1,000 mLs into feeding tube continuous. Patient taking differently: Place 75 mL/hr into feeding tube See admin instructions. 75 ml's/hr, via gastric tube, for 22 hours with a break of 2 hours for personal care 11/18/20   Eugenie Filler, MD  nystatin (MYCOSTATIN/NYSTOP) powder Apply 1 application topically See admin instructions. Apply topically once a day to sacrum peri-wound and surrounding skin- during wound dressing change    [provider]  omeprazole (PRILOSEC) 20 MG capsule Take 1 capsule (20 mg total) by mouth 2  (two) times daily before a meal. 20 mg, via G-Tube, 2 times daily x 4 weeks, then daily. 03/10/21   Eugenie Filler, MD  oxyCODONE-acetaminophen (PERCOCET/ROXICET) 5-325 MG tablet Place 1 tablet into feeding tube every 4 (four) hours as needed for severe pain. 03/10/21   Eugenie Filler, MD  polyethylene glycol (MIRALAX / GLYCOLAX) 17 g packet Place 17 g into feeding tube See admin instructions. Mix 17 grams of powder into 8 ounces of water and place into G-Tube once a day    [provider]  Pregabalin 20 MG/ML SOLN Give 100 mg by tube 2 (two) times daily. 01/24/21   [provider]  scopolamine (TRANSDERM-SCOP) 1 MG/3DAYS Place 1 patch onto the skin every 3 (three) days.    [provider]  tamsulosin (FLOMAX) 0.4 MG CAPS capsule Take 1 capsule (0.4 mg total) by mouth daily. Patient taking differently: 0.4 mg See admin instructions. 0.4 mg per G-Tube every  morning 04/28/20   Newman Pies, MD  Water For Irrigation, Sterile (FREE WATER) SOLN Place 100 mLs into feeding tube every 4 (four) hours. 03/10/21   Eugenie Filler, MD  zinc gluconate 50 MG tablet Place 50 mg into feeding tube daily.    [provider]    Physical Exam: Vitals:   07/03/21 1151 07/03/21 1200 07/03/21 1215 07/03/21 1330  BP: 134/78 121/77 132/88 122/76  Pulse: (!) 107 (!) 109 (!) 110 (!) 103  Resp: 17 19 18 17   Temp: 98.4 F (36.9 C)     TempSrc: Oral     SpO2: 98% 98% 97% 97%  Weight: 83.9 kg     Height: 6' (1.829 m)       General: 76 y.o. chronically ill appearing male resting in bed ENMT: Nares patent w/o discharge, poor dentition,  ears w/o discharge/lesions/ulcers Neck: Supple, trachea midline; sores noted posterior neck, posterior head w/ bandage Cardiovascular: RRR, +S1, S2, no m/g/r, equal pulses throughout Respiratory: decreased at bases, no w/r/r, normal WOB GI: BS+, NDNT, PEG in place MSK: No c/c; BLE edema Neuro: A&O x name only, no participating fully in  exam  Labs on Admission: I have personally reviewed following labs and imaging studies  CBC: Recent Labs  Lab 07/03/21 1225  WBC 7.4  NEUTROABS 4.7  HGB 8.3*  HCT 29.2*  MCV 83.0  PLT [id number]   Basic Metabolic Panel: Recent Labs  Lab 07/03/21 1225  NA 129*  K 3.9  CL 94*  CO2 29  GLUCOSE 134*  BUN 19  CREATININE 0.45*  CALCIUM 8.3*   GFR: Estimated Creatinine Clearance: 87.6 mL/min (A) (by C-G formula based on SCr of 0.45 mg/dL (L)). Liver Function Tests: Recent Labs  Lab 07/03/21 1225  AST 22  ALT 14  ALKPHOS 96  BILITOT 0.5  PROT 10.5*  ALBUMIN <1.5*   No results for input(s): LIPASE, AMYLASE in the last 168 hours. No results for input(s): AMMONIA in the last 168 hours. Coagulation Profile: No results for input(s): INR, PROTIME in the last 168 hours. Cardiac Enzymes: No results for input(s): CKTOTAL, CKMB, CKMBINDEX, TROPONINI in the last 168 hours. BNP (last 3 results) No results for input(s): PROBNP in the last 8760 hours. HbA1C: No results for input(s): HGBA1C in the last 72 hours. CBG: Recent Labs  Lab 07/03/21 1202  GLUCAP 146*   Lipid Profile: No results for input(s): CHOL, HDL, LDLCALC, TRIG, CHOLHDL, LDLDIRECT in the last 72 hours. Thyroid Function Tests: No results for input(s): TSH, T4TOTAL, FREET4, T3FREE, THYROIDAB in the last 72 hours. Anemia Panel: No results for input(s): VITAMINB12, FOLATE, FERRITIN, TIBC, IRON, RETICCTPCT in the last 72 hours. Urine analysis:    Component Value Date/Time   COLORURINE YELLOW 07/03/2021 1225   APPEARANCEUR TURBID (A) 07/03/2021 1225   LABSPEC 1.013 07/03/2021 1225   PHURINE 8.0 07/03/2021 1225   GLUCOSEU NEGATIVE 07/03/2021 1225   HGBUR MODERATE (A) 07/03/2021 1225   BILIRUBINUR NEGATIVE 07/03/2021 1225   KETONESUR NEGATIVE 07/03/2021 1225   PROTEINUR 30 (A) 07/03/2021 1225   NITRITE NEGATIVE 07/03/2021 1225   LEUKOCYTESUR LARGE (A) 07/03/2021 1225    Radiological Exams on Admission: DG  ABDOMEN PEG TUBE LOCATION  Result Date: 07/03/2021 CLINICAL DATA:  Provided history: Status post insertion of percutaneous endoscopic gastrostomy (PEG) tube. Additional history provided: 30 mL contrast injected via gastric tube. EXAM: ABDOMEN - 1 VIEW COMPARISON:  Intraprocedural fluoroscopic images from gastrostomy tube placement 01/19/2021. CT abdomen/pelvis 12/08/2020. FINDINGS: A gastrostomy tube  projects over the stomach. There is contrast opacification of the gastric fundus. No extraluminal contrast is identified. No dilated loops of small bowel are identified within the visualized abdomen. Moderate to large colonic stool burden within the visible abdomen suggesting constipation. IMPRESSION: A gastrostomy tube projects over the stomach. Contrast opacification of the gastric fundus. No extraluminal contrast is identified. Moderate-to-large colonic stool burden within the visible abdomen, suggesting constipation. Electronically Signed   By: Kellie Simmering D.O.   On: 07/03/2021 14:04   DG Chest Portable 1 View  Result Date: 07/03/2021 CLINICAL DATA:  76 year old male with history of altered level of consciousness. EXAM: PORTABLE CHEST 1 VIEW COMPARISON:  Chest x-ray 12/09/2020. FINDINGS: Lung volumes are low. No consolidative airspace disease. No pleural effusions. No pneumothorax. No pulmonary nodule or mass noted. Pulmonary vasculature and the cardiomediastinal silhouette are within normal limits. IMPRESSION: 1. Low lung volumes without radiographic evidence of acute cardiopulmonary disease. Electronically Signed   By: Vinnie Langton M.D.   On: 07/03/2021 12:17    EKG: Independently reviewed. 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     - place in inpt, progressive     - likely secondary to end of life and infection; see below  UTI, setting of chronic foley     - started on rocephin; continue for now  Severe protein-calorie malnutrition Dysphagia  s/p PEG tube placement     - PEG was placed in ED     - dietician for TF  Hyponatremia     - fluids, check UNa+, Uosm   Normocytic anemia     - he is at baseline, no evidence of bleed, follow  Quadraplegia Chronic head, sacral decubitus ulcers     - WOCN  Goals of care     - apparently the patient is supposed to be hospice and DNR     - family has rescinded this; they want everything done for him and want his CODE status changed to intubation only     - Palliative Care consult  DVT prophylaxis: SCDs  Code Status: intubation only, confirmed with daughter  Family Communication: w/ daughter at bedside  Consults called: palliative care   Status is: Inpatient  Remains inpatient appropriate because: severity of illness  Jonnie Finner DO [facility]  If 7PM-7AM, please contact night-coverage [email or web address]  07/03/2021, 2:09 PM

## 2021-07-03 NOTE — ED Triage Notes (Signed)
Pt to er room number 17 via ems, per ems pt is here to have a feeding tube replaced.  Daughter at bedside, MD at bedside.  Pt oriented to self, pt also knows he is in [place], pt doesn't know place or day of the week.

## 2021-07-03 NOTE — ED Notes (Signed)
ED TO INPATIENT HANDOFF REPORT  ED Nurse Name and Phone #: Ned Clines Name/Age/Gender Shannon [name] 76 y.o. male Room/Bed: WA17/WA17  Code Status   Code Status: Prior  Home/SNF/Other Skilled nursing facility Patient oriented to: self and place Is this baseline? Yes   Triage Complete: Triage complete  Chief Complaint UTI (urinary tract infection) [N39.0]  Triage Note Pt to er room number 17 via ems, per ems pt is here to have a feeding tube replaced.  Daughter at bedside, MD at bedside.  Pt oriented to self, pt also knows he is in [place], pt doesn't know place or day of the week.    Allergies Allergies  Allergen Reactions   Iodine Swelling and Other (See Comments)    "NOT CT CONTRAST" "Allergic," per MAR   Penicillins Itching, Swelling and Other (See Comments)    "** tolerates cephalosporins; facial swelling, itchy throat" "Allergic," per MAR    Level of Care/Admitting Diagnosis ED Disposition     ED Disposition  Admit   Condition  --   Comment  Hospital Area: [facility] [[id number]]  Level of Care: Progressive [102]  Admit to Progressive based on following criteria: MULTISYSTEM THREATS such as stable sepsis, metabolic/electrolyte imbalance with or without encephalopathy that is responding to early treatment.  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 Newell Coral, 1992-->surg at [facility].  Summer 2021->Cervicalgia,arm pain, hand atrophy RUE wkness, hyperreflex (Dr. 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->to get surgery 03/2020   Hay fever    Hematuria    Hypertension    Osteoarthritis of both hands    Past Surgical History:  Procedure Laterality Date   ANTERIOR CERVICAL DECOMP/DISCECTOMY FUSION N/A 04/05/2020   Procedure: ANTERIOR CERVICAL DECOMPRESSION/DISCECTOMY FUSION, INTERBODY PROSTHESIS, PLATE/SCREWS CERVICAL THREE-CERVICAL FOUR, CERVICAL FOUR- CERVICAL FIVE;  Surgeon: Tressie Stalker, MD;  Location: [facility] OR;  Service: Neurosurgery;  Laterality: N/A;   ANTERIOR CERVICAL DECOMP/DISCECTOMY FUSION N/A 04/22/2020   Procedure: Reexploration of anterior cervical wound for epidural hematoma;  Surgeon: Donalee Citrin, MD;  Location: [facility] OR;  Service: Neurosurgery;  Laterality: N/A;   BACK SURGERY     CYSTOSCOPY N/A 11/13/2020   Procedure: CYSTOSCOPY, CLOT EVACUATION, FULGURATION;  Surgeon: Noel Christmas, MD;  Location: [facility] ORS;  Service: Urology;  Laterality: N/A;   INCISION AND DRAINAGE Left 2012   L hand infection   IR GASTROSTOMY TUBE MOD SED  07/05/2020   IR REPLC GASTRO/COLONIC TUBE PERCUT W/FLUORO  01/19/2021   ROTATOR CUFF REPAIR Right    x 3   SPINE SURGERY     TRANSURETHRAL RESECTION OF PROSTATE N/A 03/06/2021   Procedure: CYSTOSCOPY, FULGURATION OF PROSTATE, CLOT EVACUATION;  Surgeon: Crista Elliot, MD;  Location: [facility] ORS;  Service: Urology;  Laterality: N/A;     A IV Location/Drains/Wounds Patient Lines/Drains/Airways Status     Active Line/Drains/Airways  Name Placement date Placement time Site Days   Peripheral IV 07/03/21 22 G Anterior;Left Forearm 07/03/21  1239  Forearm  less than 1   Gastrostomy/Enterostomy Gastrostomy 20 Fr. LUQ 01/19/21  1142  LUQ  165   Urethral Catheter Thurnell Garbe, RN Triple-lumen 22 Fr. 02/01/21  2349  Triple-lumen  152   Urethral Catheter Tylynn Braniff Latex 16 Fr.  07/03/21  1217  Latex  less than 1   Pressure Injury 03/02/21 Vertebral column Lower;Medial Stage 4 - Full thickness tissue loss with exposed bone, tendon or muscle. Full thickness wound with exposed tendon and ligaments. Pink with some streaked white slough, with area of beef reddness and  03/02/21  2305  -- 123   Pressure Injury 06/22/20 Sacrum Posterior Stage 4 - Full thickness tissue loss with exposed bone, tendon or muscle. PHYSICAL THERAPY/HYDROTHERAPY 06/22/20  1400  -- 376   Pressure Injury 12/14/20 Head Lower;Posterior Deep Tissue Pressure Injury - Purple or maroon localized area of discolored intact skin or blood-filled blister due to damage of underlying soft tissue from pressure and/or shear. Maroon discoloration, bleedi 12/14/20  0600  -- 201   Pressure Injury 02/02/21 Heel Left Deep Tissue Pressure Injury - Purple or maroon localized area of discolored intact skin or blood-filled blister due to damage of underlying soft tissue from pressure and/or shear. 02/02/21  2244  -- 151   Pressure Injury 03/02/21 Toe (Comment  which one) Anterior;Left Deep Tissue Pressure Injury - Purple or maroon localized area of discolored intact skin or blood-filled blister due to damage of underlying soft tissue from pressure and/or shear. 03/02/21  2308  -- 123            Intake/Output Last 24 hours  Intake/Output Summary (Last 24 hours) at 07/03/2021 1525 Last data filed at 07/03/2021 1524 Gross per 24 hour  Intake 1099.09 ml  Output --  Net 1099.09 ml    Labs/Imaging Results for orders placed or performed during the hospital encounter of 07/03/21 (from the past 48 hour(s))  CBG monitoring, ED     Status: Abnormal   Collection Time: 07/03/21 12:02 PM  Result Value Ref Range   Glucose-Capillary 146 (H) 70 - 99 mg/dL    Comment: Glucose reference range applies only to samples taken after fasting for at least 8 hours.  CBC with Differential     Status: Abnormal   Collection Time: 07/03/21 12:25  PM  Result Value Ref Range   WBC 7.4 4.0 - 10.5 K/uL   RBC 3.52 (L) 4.22 - 5.81 MIL/uL   Hemoglobin 8.3 (L) 13.0 - 17.0 g/dL   HCT 75.0 (L) 51.8 - 33.5 %   MCV 83.0 80.0 - 100.0 fL   MCH 23.6 (L) 26.0 - 34.0 pg   MCHC 28.4 (L) 30.0 - 36.0 g/dL   RDW 82.5 (H) 18.9 - 84.2 %   Platelets 269 150 - 400 K/uL   nRBC 0.0 0.0 - 0.2 %   Neutrophils Relative % 62 %   Neutro Abs 4.7 1.7 - 7.7 K/uL   Lymphocytes Relative 15 %   Lymphs Abs 1.1 0.7 - 4.0 K/uL   Monocytes Relative 10 %   Monocytes Absolute 0.7 0.1 - 1.0 K/uL   Eosinophils Relative 11 %   Eosinophils Absolute 0.8 (H) 0.0 - 0.5 K/uL   Basophils Relative 1 %   Basophils Absolute 0.0 0.0 - 0.1 K/uL   Immature Granulocytes 1 %   Abs Immature Granulocytes 0.05 0.00 - 0.07 K/uL  Comment: Performed at [facility], [place] [address], [place], [place] [address]  Comprehensive metabolic panel     Status: Abnormal   Collection Time: 07/03/21 12:25 PM  Result Value Ref Range   Sodium 129 (L) 135 - 145 mmol/L   Potassium 3.9 3.5 - 5.1 mmol/L   Chloride 94 (L) 98 - 111 mmol/L   CO2 29 22 - 32 mmol/L   Glucose, Bld 134 (H) 70 - 99 mg/dL    Comment: Glucose reference range applies only to samples taken after fasting for at least 8 hours.   BUN 19 8 - 23 mg/dL   Creatinine, Ser 0.45 (L) 0.61 - 1.24 mg/dL   Calcium 8.3 (L) 8.9 - 10.3 mg/dL   Total Protein 10.5 (H) 6.5 - 8.1 g/dL   Albumin <1.5 (L) 3.5 - 5.0 g/dL   AST 22 15 - 41 U/L   ALT 14 0 - 44 U/L   Alkaline Phosphatase 96 38 - 126 U/L   Total Bilirubin 0.5 0.3 - 1.2 mg/dL   GFR, Estimated >60 >60 mL/min    Comment: (NOTE) Calculated using the CKD-EPI Creatinine Equation (2021)    Anion gap 6 5 - 15    Comment: Performed at [facility], [place] [address] [place], [place] [address]  Urinalysis, Routine w reflex microscopic Urine, Catheterized     Status: Abnormal   Collection Time: 07/03/21 12:25 PM  Result Value Ref Range   Color, Urine  YELLOW YELLOW   APPearance TURBID (A) CLEAR   Specific Gravity, Urine 1.013 1.005 - 1.030   pH 8.0 5.0 - 8.0   Glucose, UA NEGATIVE NEGATIVE mg/dL   Hgb urine dipstick MODERATE (A) NEGATIVE   Bilirubin Urine NEGATIVE NEGATIVE   Ketones, ur NEGATIVE NEGATIVE mg/dL   Protein, ur 30 (A) NEGATIVE mg/dL   Nitrite NEGATIVE NEGATIVE   Leukocytes,Ua LARGE (A) NEGATIVE   RBC / HPF >50 (H) 0 - 5 RBC/hpf   WBC, UA >50 (H) 0 - 5 WBC/hpf   Bacteria, UA MANY (A) NONE SEEN   WBC Clumps PRESENT     Comment: Performed at [facility], [place] [address], [place], [place] [address]  Lactic acid, plasma     Status: None   Collection Time: 07/03/21 12:25 PM  Result Value Ref Range   Lactic Acid, Venous 1.1 0.5 - 1.9 mmol/L    Comment: Performed at [facility], [place] [address] [place], [place] [address]  Resp Panel by RT-PCR (Flu A&B, Covid) Urine, Catheterized     Status: None   Collection Time: 07/03/21 12:25 PM   Specimen: Urine, Catheterized; Nasopharyngeal(NP) swabs in vial transport medium  Result Value Ref Range   SARS Coronavirus 2 by RT PCR NEGATIVE NEGATIVE    Comment: (NOTE) SARS-CoV-2 target nucleic acids are NOT DETECTED.  The SARS-CoV-2 RNA is generally detectable in upper respiratory specimens during the acute phase of infection. The lowest concentration of SARS-CoV-2 viral copies this assay can detect is 138 copies/mL. A negative result does not preclude SARS-Cov-2 infection and should not be used as the sole basis for treatment or other patient management decisions. A negative result may occur with  improper specimen collection/handling, submission of specimen other than nasopharyngeal swab, presence of viral mutation(s) within the areas targeted by this assay, and inadequate number of viral copies(<138 copies/mL). A negative result must be combined with clinical observations, patient history, and epidemiological information. The expected result  is Negative.  Fact  Sheet for Patients:  EntrepreneurPulse.com.au  Fact Sheet for Healthcare Providers:  IncredibleEmployment.be  This test is no t yet approved or cleared by the [place] FDA and  has been authorized for detection and/or diagnosis of SARS-CoV-2 by FDA under an Emergency Use Authorization (EUA). This EUA will remain  in effect (meaning this test can be used) for the duration of the COVID-19 declaration under Section 564(b)(1) of the Act, 21 U.S.C.section 360bbb-3(b)(1), unless the authorization is terminated  or revoked sooner.       Influenza A by PCR NEGATIVE NEGATIVE   Influenza B by PCR NEGATIVE NEGATIVE    Comment: (NOTE) The Xpert Xpress SARS-CoV-2/FLU/RSV plus assay is intended as an aid in the diagnosis of influenza from Nasopharyngeal swab specimens and should not be used as a sole basis for treatment. Nasal washings and aspirates are unacceptable for Xpert Xpress SARS-CoV-2/FLU/RSV testing.  Fact Sheet for Patients: EntrepreneurPulse.com.au  Fact Sheet for Healthcare Providers: IncredibleEmployment.be  This test is not yet approved or cleared by the [place] FDA and has been authorized for detection and/or diagnosis of SARS-CoV-2 by FDA under an Emergency Use Authorization (EUA). This EUA will remain in effect (meaning this test can be used) for the duration of the COVID-19 declaration under Section 564(b)(1) of the Act, 21 U.S.C. section 360bbb-3(b)(1), unless the authorization is terminated or revoked.  Performed at [facility], [place] [address], [place], [place] [address]   Lactic acid, plasma     Status: None   Collection Time: 07/03/21  2:25 PM  Result Value Ref Range   Lactic Acid, Venous 1.2 0.5 - 1.9 mmol/L    Comment: Performed at [facility], [place] [address] [place], [place] [address]   DG ABDOMEN PEG TUBE  LOCATION  Result Date: 07/03/2021 CLINICAL DATA:  Provided history: Status post insertion of percutaneous endoscopic gastrostomy (PEG) tube. Additional history provided: 30 mL contrast injected via gastric tube. EXAM: ABDOMEN - 1 VIEW COMPARISON:  Intraprocedural fluoroscopic images from gastrostomy tube placement 01/19/2021. CT abdomen/pelvis 12/08/2020. FINDINGS: A gastrostomy tube projects over the stomach. There is contrast opacification of the gastric fundus. No extraluminal contrast is identified. No dilated loops of small bowel are identified within the visualized abdomen. Moderate to large colonic stool burden within the visible abdomen suggesting constipation. IMPRESSION: A gastrostomy tube projects over the stomach. Contrast opacification of the gastric fundus. No extraluminal contrast is identified. Moderate-to-large colonic stool burden within the visible abdomen, suggesting constipation. Electronically Signed   By: Kellie Simmering D.O.   On: 07/03/2021 14:04   DG Chest Portable 1 View  Result Date: 07/03/2021 CLINICAL DATA:  76 year old male with history of altered level of consciousness. EXAM: PORTABLE CHEST 1 VIEW COMPARISON:  Chest x-ray 12/09/2020. FINDINGS: Lung volumes are low. No consolidative airspace disease. No pleural effusions. No pneumothorax. No pulmonary nodule or mass noted. Pulmonary vasculature and the cardiomediastinal silhouette are within normal limits. IMPRESSION: 1. Low lung volumes without radiographic evidence of acute cardiopulmonary disease. Electronically Signed   By: Vinnie Langton M.D.   On: 07/03/2021 12:17    Pending Labs Unresulted Labs (From admission, onward)     Start     Ordered   07/03/21 1152  Urine Culture  Once,   STAT       Question:  Indication  Answer:  Altered mental status (if no other cause identified)   07/03/21 1151   07/03/21 1152  Culture, blood (routine x 2)  BLOOD CULTURE X 2,  STAT      07/03/21 1151             Vitals/Pain Today's Vitals   07/03/21 1400 07/03/21 1430 07/03/21 1433 07/03/21 1524  BP: 134/71 128/81  114/71  Pulse: (!) 107 (!) 106  (!) 110  Resp: 17 17  16   Temp: 98.6 F (37 C)   99.1 F (37.3 C)  TempSrc: Oral   Oral  SpO2: 97% 96%  97%  Weight:      Height:      PainSc: 0-No pain  0-No pain 0-No pain    Isolation Precautions No active isolations  Medications Medications  sodium chloride 0.9 % bolus 1,000 mL (0 mLs Intravenous Stopped 07/03/21 1402)  iohexol (OMNIPAQUE) 300 MG/ML solution 100 mL (30 mLs Per Tube Contrast Given 07/03/21 1357)  cefTRIAXone (ROCEPHIN) 1 g in sodium chloride 0.9 % 100 mL IVPB (0 g Intravenous Stopped 07/03/21 1524)    Mobility non-ambulatory High fall risk   Focused Assessments    R Recommendations: See Admitting Provider Note  Report given to:   Additional Notes:

## 2021-07-03 NOTE — ED Notes (Signed)
21fr g tube placed by MD, pt tolerated well.
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
## 2021-07-04 DIAGNOSIS — L8990 Pressure ulcer of unspecified site, unspecified stage: Secondary | ICD-10-CM | POA: Insufficient documentation

## 2021-07-04 DIAGNOSIS — N390 Urinary tract infection, site not specified: Secondary | ICD-10-CM | POA: Diagnosis not present

## 2021-07-04 DIAGNOSIS — T83511A Infection and inflammatory reaction due to indwelling urethral catheter, initial encounter: Secondary | ICD-10-CM | POA: Diagnosis not present

## 2021-07-04 LAB — CBC
HCT: 25.2 % — ABNORMAL LOW (ref 39.0–52.0)
Hemoglobin: 7.2 g/dL — ABNORMAL LOW (ref 13.0–17.0)
MCH: 23.7 pg — ABNORMAL LOW (ref 26.0–34.0)
MCHC: 28.6 g/dL — ABNORMAL LOW (ref 30.0–36.0)
MCV: 82.9 fL (ref 80.0–100.0)
Platelets: 271 10*3/uL (ref 150–400)
RBC: 3.04 MIL/uL — ABNORMAL LOW (ref 4.22–5.81)
RDW: 18.4 % — ABNORMAL HIGH (ref 11.5–15.5)
WBC: 6.3 10*3/uL (ref 4.0–10.5)
nRBC: 0 % (ref 0.0–0.2)

## 2021-07-04 LAB — COMPREHENSIVE METABOLIC PANEL
ALT: 14 U/L (ref 0–44)
AST: 23 U/L (ref 15–41)
Albumin: <1.5 g/dL — ABNORMAL LOW (ref 3.5–5.0)
Alkaline Phosphatase: 86 U/L (ref 38–126)
Anion gap: 7 (ref 5–15)
BUN: 22 mg/dL (ref 8–23)
CO2: 25 mmol/L (ref 22–32)
Calcium: 8.3 mg/dL — ABNORMAL LOW (ref 8.9–10.3)
Chloride: 98 mmol/L (ref 98–111)
Creatinine, Ser: 0.45 mg/dL — ABNORMAL LOW (ref 0.61–1.24)
GFR, Estimated: >60 mL/min (ref >60)
Glucose, Bld: 93 mg/dL (ref 70–99)
Potassium: 4 mmol/L (ref 3.5–5.1)
Sodium: 130 mmol/L — ABNORMAL LOW (ref 135–145)
Total Bilirubin: 0.7 mg/dL (ref 0.3–1.2)
Total Protein: 9.5 g/dL — ABNORMAL HIGH (ref 6.5–8.1)

## 2021-07-04 LAB — GLUCOSE, CAPILLARY
Glucose-Capillary: 115 mg/dL — ABNORMAL HIGH (ref 70–99)
Glucose-Capillary: 115 mg/dL — ABNORMAL HIGH (ref 70–99)

## 2021-07-04 MED ORDER — JUVEN PO PACK
1.0000 | PACK | Freq: Two times a day (BID) | ORAL | Status: DC
  Administered 2021-07-04 – 2021-07-05 (×2): 1
  Filled 2021-07-04 (×3): qty 1

## 2021-07-04 MED ORDER — ZINC SULFATE 220 (50 ZN) MG PO CAPS
220.0000 mg | ORAL_CAPSULE | Freq: Every day | ORAL | Status: DC
  Administered 2021-07-05: 220 mg
  Filled 2021-07-04: qty 1

## 2021-07-04 MED ORDER — OSMOLITE 1.5 CAL PO LIQD
1000.0000 mL | ORAL | Status: DC
  Administered 2021-07-04: 1000 mL
  Filled 2021-07-04 (×2): qty 1000

## 2021-07-04 MED ORDER — ASCORBIC ACID 500 MG PO TABS
500.0000 mg | ORAL_TABLET | Freq: Two times a day (BID) | ORAL | Status: DC
  Administered 2021-07-04 – 2021-07-05 (×2): 500 mg
  Filled 2021-07-04 (×2): qty 1

## 2021-07-04 MED ORDER — PROSOURCE TF PO LIQD
45.0000 mL | Freq: Two times a day (BID) | ORAL | Status: DC
  Administered 2021-07-04 – 2021-07-05 (×2): 45 mL
  Filled 2021-07-04 (×3): qty 45

## 2021-07-04 MED ORDER — OSMOLITE 1.2 CAL PO LIQD: 1000.0000 mL | ORAL | Status: DC

## 2021-07-04 MED ORDER — CHLORHEXIDINE GLUCONATE CLOTH 2 % EX PADS
6.0000 | MEDICATED_PAD | Freq: Every day | CUTANEOUS | Status: DC
  Administered 2021-07-04 – 2021-07-05 (×3): 6 via TOPICAL

## 2021-07-04 MED ORDER — ZINC OXIDE 40 % EX OINT: TOPICAL_OINTMENT | Freq: Two times a day (BID) | CUTANEOUS | Status: DC | PRN

## 2021-07-04 NOTE — Progress Notes (Signed)
[id number]   [facility] ([facility])       This patient is a current hospice patient with [facility], admitted --with a terminal diagnosis of--.     [facility] will continue to follow for any discharge planning needs and to coordinate continuation of hospice care.   Please don't hesitate to call with any Hospice related questions or concerns.    Thank you for the opportunity to participate in this patient's care.   Lynder Parents [facility] Liaison [phone]

## 2021-07-04 NOTE — Consult Note (Addendum)
[place] Nurse Consult Note: Patient receiving care in [facility] Reason for Consult:Sacrum, right lower leg, buttocks, MASD groin Wound type: Posterior head is a stage 3 measuring 5.8 x 5.6 x 0.3 20% black 80% red with brown tan drainage and painful Stage 4 sacrum 100% pink granulation tissue measureing 8 x 8 x 1 with undermining from 3 o'clock to 10 o'clock measuring 2 cm, large amount of sanguinous drainage. RLE is a full thickness wound that is 50% black 50% red measuring 21 x 3 x 0.3 with serosanguinous drainage LLE is a full thickness wound that 20% black and 80% red and has serosanguinous drainage.  Pressure Injury POA: Yes Dressing procedure/placement/frequency: Clean the sacral area with no rinse cleanser. Place a piece of Aquacel Advantage Kellie Simmering # [id number]) in the wound, cover with 2 ABD pads for drainage and secure with Medipore tape. Change daily or PRN maximal drainage.  Clean the BLE with soap and water, rinse and pat dry. Apply a piece of Xeroform gauze over both wounds and cover with ABD pad and secure with Kerlix. Change daily.  Clean the head wound with NS, dry and place a piece of Aquacel Advantage over the wound and secure with a foam dressing. Change daily.  May use Desitin in the groin area for MASD  Monitor the wound area(s) for worsening of condition such as: Signs/symptoms of infection, increase in size, development of or worsening of odor, development of pain, or increased pain at the affected locations.   Notify the medical team if any of these develop.  Thank you for the consult. [place] nurse will not follow at this time.   Please re-consult the [place] team if needed.  Cathlean Marseilles Tamala Julian, MSN, RN, [place], Lysle Pearl, [facility] Wound Treatment Associate Pager [phone]

## 2021-07-04 NOTE — Progress Notes (Signed)
PROGRESS NOTE    Shannon [name]  [id number] DOB: Aug 11, 1945 DOA: 07/03/2021 PCP: Tammi Sou, MD   Brief Narrative:  Shannon [name] is a 76 y.o. male with medical history significant of HTN, chronic indwelling catheter, asthma, quadriplegia, Chiari malformation type I, HTN. History is from daughter. She reports that the patient's sore on his head and neck have worsened in the facility. She reports that his PEG fell out. She reports that he does not seem like himself and that he is less talkative. She requested transfer to the ED for evaluation.   Assessment & Plan:  Acute on chronic metabolic encephalopathy -In the setting of infection versus progressive decline -Continue to discuss goals of care with family and palliative care team   UTI, setting of chronic foley -Continue ceftriaxone   Severe protein-calorie malnutrition Chronic dysphagia s/p PEG tube placement - PEG was replaced in ED - dietician for TF   Hypovolemic hyponatremia -Continue to advance fluids, free water flushes   Normocytic anemia -Chronic, at baseline   Quadraplegia Chronic head, sacral decubitus ulcers     - WOCN   Goals of care -Following with hospice, DNR per report, family rescinded this, DNI only for now -Palliative care to continue to follow  DVT prophylaxis: SCDs  Code Status: intubation only, confirmed with daughter  Family Communication: None  Status is: Inpatient  Dispo: The patient is from: Facility              Anticipated d/c is to: Same              Anticipated d/c date is: 24 to 48 hours              Patient currently not medically stable for discharge  Consultants:  Palliative care  Procedures:  None  Antimicrobials:  Ceftriaxone  Subjective: Known no acute issues or events overnight  Objective: Vitals:   07/03/21 2116 07/04/21 0129 07/04/21 0523 07/04/21 0633  BP:  109/73  109/72  Pulse:  (!) 109  (!) 107  Resp:  17  18  Temp:  98.3 F (36.8 C)   98.1 F (36.7 C)  TempSrc:  Oral Oral Axillary  SpO2: 97% 97%  98%  Weight:      Height:        Intake/Output Summary (Last 24 hours) at 07/04/2021 0724 Last data filed at 07/04/2021 0647 Gross per 24 hour  Intake 1099.09 ml  Output 1600 ml  Net -500.91 ml   Filed Weights   07/03/21 1151  Weight: 83.9 kg    Examination:  General: Resting comfortably, no acute distress but minimally responsive HEENT:  Normocephalic atraumatic.  Sclerae nonicteric, noninjected.  Extraocular movements intact bilaterally. Neck:  Without mass or deformity.  Trachea is midline. Lungs:  Clear to auscultate bilaterally without rhonchi, wheeze, or rales. Heart:  Regular rate and rhythm.  Without murmurs, rubs, or gallops. Abdomen:  Soft, nontender, nondistended.  Without guarding or rebound.  PEG tube clean dry intact Extremities: Without cyanosis, clubbing, edema, or obvious deformity. Vascular:  Dorsalis pedis and posterior tibial pulses palpable bilaterally. Skin:  Warm and dry, no erythema  Data Reviewed: I have personally reviewed following labs and imaging studies  CBC: Recent Labs  Lab 07/03/21 1225 07/04/21 0419  WBC 7.4 6.3  NEUTROABS 4.7  --   HGB 8.3* 7.2*  HCT 29.2* 25.2*  MCV 83.0 82.9  PLT 269 99991111   Basic Metabolic Panel: Recent Labs  Lab 07/03/21 1225 07/04/21 0419  NA 129* 130*  K 3.9 4.0  CL 94* 98  CO2 29 25  GLUCOSE 134* 93  BUN 19 22  CREATININE 0.45* 0.45*  CALCIUM 8.3* 8.3*   GFR: Estimated Creatinine Clearance: 87.6 mL/min (A) (by C-G formula based on SCr of 0.45 mg/dL (L)). Liver Function Tests: Recent Labs  Lab 07/03/21 1225 07/04/21 0419  AST 22 23  ALT 14 14  ALKPHOS 96 86  BILITOT 0.5 0.7  PROT 10.5* 9.5*  ALBUMIN <1.5* <1.5*   No results for input(s): LIPASE, AMYLASE in the last 168 hours. No results for input(s): AMMONIA in the last 168 hours. Coagulation Profile: No results for input(s): INR, PROTIME in the last 168 hours. Cardiac  Enzymes: No results for input(s): CKTOTAL, CKMB, CKMBINDEX, TROPONINI in the last 168 hours. BNP (last 3 results) No results for input(s): PROBNP in the last 8760 hours. HbA1C: No results for input(s): HGBA1C in the last 72 hours. CBG: Recent Labs  Lab 07/03/21 1202  GLUCAP 146*   Lipid Profile: No results for input(s): CHOL, HDL, LDLCALC, TRIG, CHOLHDL, LDLDIRECT in the last 72 hours. Thyroid Function Tests: No results for input(s): TSH, T4TOTAL, FREET4, T3FREE, THYROIDAB in the last 72 hours. Anemia Panel: No results for input(s): VITAMINB12, FOLATE, FERRITIN, TIBC, IRON, RETICCTPCT in the last 72 hours. Sepsis Labs: Recent Labs  Lab 07/03/21 1225 07/03/21 1425  LATICACIDVEN 1.1 1.2    Recent Results (from the past 240 hour(s))  Resp Panel by RT-PCR (Flu A&B, Covid) Urine, Catheterized     Status: None   Collection Time: 07/03/21 12:25 PM   Specimen: Urine, Catheterized; Nasopharyngeal(NP) swabs in vial transport medium  Result Value Ref Range Status   SARS Coronavirus 2 by RT PCR NEGATIVE NEGATIVE Final    Comment: (NOTE) SARS-CoV-2 target nucleic acids are NOT DETECTED.  The SARS-CoV-2 RNA is generally detectable in upper respiratory specimens during the acute phase of infection. The lowest concentration of SARS-CoV-2 viral copies this assay can detect is 138 copies/mL. A negative result does not preclude SARS-Cov-2 infection and should not be used as the sole basis for treatment or other patient management decisions. A negative result may occur with  improper specimen collection/handling, submission of specimen other than nasopharyngeal swab, presence of viral mutation(s) within the areas targeted by this assay, and inadequate number of viral copies(<138 copies/mL). A negative result must be combined with clinical observations, patient history, and epidemiological information. The expected result is Negative.  Fact Sheet for Patients:   EntrepreneurPulse.com.au  Fact Sheet for Healthcare Providers:  IncredibleEmployment.be  This test is no t yet approved or cleared by the [place] FDA and  has been authorized for detection and/or diagnosis of SARS-CoV-2 by FDA under an Emergency Use Authorization (EUA). This EUA will remain  in effect (meaning this test can be used) for the duration of the COVID-19 declaration under Section 564(b)(1) of the Act, 21 U.S.C.section 360bbb-3(b)(1), unless the authorization is terminated  or revoked sooner.       Influenza A by PCR NEGATIVE NEGATIVE Final   Influenza B by PCR NEGATIVE NEGATIVE Final    Comment: (NOTE) The Xpert Xpress SARS-CoV-2/FLU/RSV plus assay is intended as an aid in the diagnosis of influenza from Nasopharyngeal swab specimens and should not be used as a sole basis for treatment. Nasal washings and aspirates are unacceptable for Xpert Xpress SARS-CoV-2/FLU/RSV testing.  Fact Sheet for Patients: EntrepreneurPulse.com.au  Fact Sheet for Healthcare Providers: IncredibleEmployment.be  This test is not yet approved or cleared  by the [place] and has been authorized for detection and/or diagnosis of SARS-CoV-2 by FDA under an Emergency Use Authorization (EUA). This EUA will remain in effect (meaning this test can be used) for the duration of the COVID-19 declaration under Section 564(b)(1) of the Act, 21 U.S.C. section 360bbb-3(b)(1), unless the authorization is terminated or revoked.  Performed at [facility], [place] [address], [place], [place] [address]      Radiology Studies: DG ABDOMEN PEG TUBE LOCATION  Result Date: 07/03/2021 CLINICAL DATA:  Provided history: Status post insertion of percutaneous endoscopic gastrostomy (PEG) tube. Additional history provided: 30 mL contrast injected via gastric tube. EXAM: ABDOMEN - 1 VIEW COMPARISON:   Intraprocedural fluoroscopic images from gastrostomy tube placement 01/19/2021. CT abdomen/pelvis 12/08/2020. FINDINGS: A gastrostomy tube projects over the stomach. There is contrast opacification of the gastric fundus. No extraluminal contrast is identified. No dilated loops of small bowel are identified within the visualized abdomen. Moderate to large colonic stool burden within the visible abdomen suggesting constipation. IMPRESSION: A gastrostomy tube projects over the stomach. Contrast opacification of the gastric fundus. No extraluminal contrast is identified. Moderate-to-large colonic stool burden within the visible abdomen, suggesting constipation. Electronically Signed   By: Kellie Simmering D.O.   On: 07/03/2021 14:04   DG Chest Portable 1 View  Result Date: 07/03/2021 CLINICAL DATA:  76 year old male with history of altered level of consciousness. EXAM: PORTABLE CHEST 1 VIEW COMPARISON:  Chest x-ray 12/09/2020. FINDINGS: Lung volumes are low. No consolidative airspace disease. No pleural effusions. No pneumothorax. No pulmonary nodule or mass noted. Pulmonary vasculature and the cardiomediastinal silhouette are within normal limits. IMPRESSION: 1. Low lung volumes without radiographic evidence of acute cardiopulmonary disease. Electronically Signed   By: Vinnie Langton M.D.   On: 07/03/2021 12:17        Scheduled Meds:  albuterol  2.5 mg Nebulization BID   aspirin  81 mg Per Tube q morning   Chlorhexidine Gluconate Cloth  6 each Topical Daily   docusate  100 mg Per Tube BID   DULoxetine  20 mg Oral QHS   feeding supplement (PROSource TF)  45 mL Per Tube Daily   ferrous sulfate  220 mg Per Tube BID   finasteride  5 mg Oral Daily   free water  100 mL Per Tube Q4H   guaiFENesin  100 mg Per Tube BID   midodrine  10 mg Per Tube TID with meals   nutrition supplement (JUVEN)  1 packet Per Tube BID BM   nystatin  1 application Topical Daily   polyethylene glycol  17 g Per Tube Daily    saccharomyces boulardii  250 mg Per Tube Daily   scopolamine  1 patch Transdermal Q72H   tamsulosin  0.4 mg Oral Daily   Continuous Infusions:  cefTRIAXone (ROCEPHIN)  IV       LOS: 1 day   Time spent: 40min  Tallin Hart C Vimal Derego, DO [facility]  If 7PM-7AM, please contact night-coverage [email or web address]  07/04/2021, 7:24 AM

## 2021-07-04 NOTE — TOC Initial Note (Signed)
Transition of Care [facility]) - Initial/Assessment Note    Patient Details  Name: Shannon [name] MRN: [id number] Date of Birth: May 19, 1946  [facility] CM/SW Contact:    Dessa Phi, RN Phone Number: 07/04/2021, 3:17 PM  Clinical Narrative:   d/cplan return back to [facility] w/hospice([facility]                Expected Discharge Plan: Skilled Nursing Facility Barriers to Discharge: Continued Medical Work up   Patient Goals and CMS Choice        Expected Discharge Plan and Services Expected Discharge Plan: [place]   Discharge Planning Services: CM Consult   Living arrangements for the past 2 months: [place]                                      Prior Living Arrangements/Services Living arrangements for the past 2 months: [place] Lives with:: Adult Children                   Activities of Daily Living   ADL Screening (condition at time of admission) Patient's cognitive ability adequate to safely complete daily activities?: No Is the patient deaf or have difficulty hearing?: No Does the patient have difficulty seeing, even when wearing glasses/contacts?: Yes Does the patient have difficulty concentrating, remembering, or making decisions?: Yes Patient able to express need for assistance with ADLs?: No Does the patient have difficulty dressing or bathing?: Yes Independently performs ADLs?: No Communication: Dependent Is this a change from baseline?: Pre-admission baseline Dressing (OT): Dependent Is this a change from baseline?: Pre-admission baseline Grooming: Dependent Is this a change from baseline?: Pre-admission baseline Feeding: Dependent Is this a change from baseline?: Pre-admission baseline Bathing: Dependent Is this a change from baseline?: Pre-admission baseline Toileting: Dependent Is this a change from baseline?: Pre-admission baseline In/Out Bed: Dependent Is this  a change from baseline?: Pre-admission baseline Walks in Home: Dependent Is this a change from baseline?: Pre-admission baseline Does the patient have difficulty walking or climbing stairs?: Yes Weakness of Legs: Both Weakness of Arms/Hands: Both  Permission Sought/Granted                  Emotional Assessment              Admission diagnosis:  Hyponatremia [E87.1] UTI (urinary tract infection) [N39.0] Chronic anemia [D64.9] Dislodged gastrostomy tube [id number] Encounter for Foley catheter replacement [Z46.6] Status post insertion of percutaneous endoscopic gastrostomy (PEG) tube ([place]) [id number] Acute metabolic encephalopathy [id number] Urinary tract infection associated with indwelling urethral catheter, initial encounter ([place]) [id number], N39.0] Pressure injury of skin, unspecified injury stage, unspecified location [L89.90] Patient Active Problem List   Diagnosis Date Noted   Pressure injury of skin 07/04/2021   UTI (urinary tract infection) 07/03/2021   Hypokalemia    Melena    Sepsis secondary to UTI ([place]) 03/03/2021   Sepsis due to gram-negative UTI ([place]) 03/02/2021   Severe sepsis with septic shock ([place]) 12/08/2020   Scrotal edema 11/18/2020   Stage IV pressure ulcer of sacral region ([place]) 11/13/2020   Anemia 11/13/2020   Hemorrhagic shock ([place]) 11/13/2020   Gross hematuria 11/12/2020   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 [place] [address] Phone: [phone] Fax: [phone]     Social Determinants of Health (SDOH) Interventions    Readmission Risk Interventions No flowsheet data found.

## 2021-07-04 NOTE — Progress Notes (Addendum)
[place] [address] - Manufacturing engineer [facility]) Hospitalize hospice patient visit.  This is a current [facility] hospice patient with a terminal diagnosis of protein calorie malnutrition. Patients feeding tube became dislodged daughter made decision to have patient transported to ED for evaluation. Patient was admitted to the hospital on 07/03/2021 with a diagnosis of metabolic encephalopathy. Per Dr. Eulas Post with [facility] this is a related hospital admission.   Visited at bedside and received report from RN. Patient did not respond to voice, did not appear in distress. No visitors at time of visit. In addition to being treated for UTI, he has a stage 4 wound to sacrum, and wounds on right lower leg, buttocks and MASD in groin area. Wound care was consulted to assess.   Patient is appropriate for inpatient due to need for IV antibiotics.   VS: 98.1, 108/65, 104, 14, 97% RA I/O: 1099.06/1598  Abnormal labs:  07/04/21 04:19 Sodium: 130 (L) Creatinine: 0.45 (L) Calcium: 8.3 (L) Albumin: <1.5 (L) Total Protein: 9.5 (H) RBC: 3.04 (L) Hemoglobin: 7.2 (L) HCT: 25.2 (L) MCH: 23.7 (L) MCHC: 28.6 (L) RDW: 18.4 (H)  Diagnostics: CXR IMPRESSION: 1. Low lung volumes without radiographic evidence of acute cardiopulmonary disease.   IV/PRN Medications: Rocephin 1g IVPB QD, Osmolite 1.5cal, 6ml/hr  Problem list: Acute on chronic metabolic encephalopathy -In the setting of infection versus progressive decline -Continue to discuss goals of care with family and palliative care team   UTI, setting of chronic foley -Continue ceftriaxone   Severe protein-calorie malnutrition Chronic dysphagia s/p PEG tube placement - PEG was replaced in ED - dietician for TF   Hypovolemic hyponatremia -Continue to advance fluids, free water flushes   Normocytic anemia -Chronic, at baseline   Quadraplegia Chronic head, sacral decubitus ulcers     - WOCN  Discharge planning: Return to SNF with hospice when  appropriate for discharge. Family Contact:Spoke with daughter by phone IDG: Team updated GOC: ongoing. Currently a partial code  Please used [facility] for ambulance transport at discharge.   Please do not hesitate to call with questions.   Thank you,   Farrel Gordon, RN, [facility] Liaison   [phone]

## 2021-07-04 NOTE — Progress Notes (Addendum)
Initial Nutrition Assessment  DOCUMENTATION CODES:   Not applicable  INTERVENTION:  - will order Osmolite 1.5 @ 60 ml/hr with 45 ml Prosource TF BID and 1 packet Juven BID. - this regimen provides 2430 kcal, 117 grams protein, and 1100 ml free water.  - free water flush to continue to be per MD due to hyponatremia.  - will order 500 mg ascorbic acid BID and 220 mg zinc sulfate/day via PEG to aid in wound healing (cleared by MD).   NUTRITION DIAGNOSIS:   Increased nutrient needs related to wound healing as evidenced by estimated needs.  GOAL:   Patient will meet greater than or equal to 90% of their needs  MONITOR:   TF tolerance, Labs, Weight trends, Skin  REASON FOR ASSESSMENT:   Consult Enteral/tube feeding initiation and management  ASSESSMENT:   76 y.o. male with medical history of HTN, chronic indwelling catheter, asthma, quadriplegia, s/p PEG placement, Chiari malformation type I, and HTN. He presented to the ED from facility due to worsening sore on head and neck and PEG dislodgement.  Patient has been NPO since admission. PEG replaced in the ED. Orders currently in place for 45 ml Prosource/day, 1 packet Juven/day, and 100 ml free water every 4 hours via PEG. This regimen provides 230 kcal, 16 grams protein, and 600 ml free water.  Patient laying in bed with his niece, Malachi BondsGloria, at bedside. She provides all information. [facility] TF via PEG for nutrition. He was allowed to have ice chips but more recently he was not as interested in taking ice chips. He is unable to have anything else PO.   Weight yesterday was documented as 185 lb and appears to be a stated weight. Weight on 02/03/21 and 03/02/21 was 176 lb. Weight on 01/19/21 was 190 lb. This would indicate 5 lb weight loss (2.6% body weight) in the past 5.5 months; not significant for time frame.   Per review of chart, patient [facility] He was  also receiving this formula during admission in 02/2021.   Labs reviewed; Na: 130 mmol/l, creatinine: 0.45 mg/dl, Ca: 8.3 mg/dl.  Medications reviewed; 100 mg colace BID, 220 mg ferrous sulfate per PEG BID, 17 g miralax/day, 250 mg florastor/day.    NUTRITION - FOCUSED PHYSICAL EXAM:  Flowsheet Row Most Recent Value  Orbital Region No depletion  Upper Arm Region No depletion  Thoracic and Lumbar Region Unable to assess  Buccal Region No depletion  Temple Region Mild depletion  Clavicle Bone Region No depletion  Clavicle and Acromion Bone Region Mild depletion  Scapular Bone Region No depletion  Dorsal Hand No depletion  Patellar Region No depletion  Anterior Thigh Region No depletion  Posterior Calf Region Unable to assess  [bilterally wrapped]  Edema (RD Assessment) Moderate  [BLE]  Hair Reviewed  Eyes Reviewed  Mouth Reviewed  Skin Reviewed  Nails Reviewed       Diet Order:   Diet Order             Diet NPO time specified  Diet effective now                   EDUCATION NEEDS:   Education needs have been addressed  Skin:  Skin Assessment: Skin Integrity Issues: Skin Integrity Issues:: DTI, Stage II, Stage IV DTI: lower head Stage II: bilateral tibia Stage IV: vertebral column  Last BM:  PTA/unknown  Height:  Ht Readings from Last 1 Encounters:  07/03/21 6' (1.829 m)    Weight:   Wt Readings from Last 1 Encounters:  07/03/21 83.9 kg     Estimated Nutritional Needs:  Kcal:  2300-2500 kcal Protein:  115-125 grams Fluid:  >/= 2.5 L/day     Jarome Matin, MS, RD, LDN Inpatient Clinical Dietitian RD pager # available in [place]  After hours/weekend pager # available in [facility]

## 2021-07-05 DIAGNOSIS — L8990 Pressure ulcer of unspecified site, unspecified stage: Secondary | ICD-10-CM | POA: Diagnosis not present

## 2021-07-05 DIAGNOSIS — N390 Urinary tract infection, site not specified: Secondary | ICD-10-CM | POA: Diagnosis not present

## 2021-07-05 DIAGNOSIS — T83511A Infection and inflammatory reaction due to indwelling urethral catheter, initial encounter: Secondary | ICD-10-CM | POA: Diagnosis not present

## 2021-07-05 LAB — BLOOD CULTURE ID PANEL (REFLEXED) - BCID2

## 2021-07-05 LAB — GLUCOSE, CAPILLARY
Glucose-Capillary: 170 mg/dL — ABNORMAL HIGH (ref 70–99)
Glucose-Capillary: 176 mg/dL — ABNORMAL HIGH (ref 70–99)
Glucose-Capillary: 155 mg/dL — ABNORMAL HIGH (ref 70–99)

## 2021-07-05 LAB — URINE CULTURE: Culture: 40000 — AB

## 2021-07-05 MED ORDER — SODIUM CHLORIDE 0.9 % IV SOLN
1.0000 g | INTRAVENOUS | Status: AC
  Administered 2021-07-05: 1 g via INTRAVENOUS
  Filled 2021-07-05: qty 10

## 2021-07-05 MED ORDER — OXYCODONE HCL 5 MG PO TABS
5.0000 mg | ORAL_TABLET | Freq: Four times a day (QID) | ORAL | 0 refills | Status: AC | PRN
Start: 2021-07-05 — End: 2021-07-08

## 2021-07-05 MED ORDER — SODIUM CHLORIDE 0.9 % IV SOLN: INTRAVENOUS | Status: DC | PRN

## 2021-07-05 MED ORDER — ALBUTEROL SULFATE (2.5 MG/3ML) 0.083% IN NEBU: 2.5000 mg | INHALATION_SOLUTION | Freq: Four times a day (QID) | RESPIRATORY_TRACT | Status: DC | PRN

## 2021-07-05 MED ORDER — GABAPENTIN 300 MG PO CAPS
300.0000 mg | ORAL_CAPSULE | Freq: Every day | ORAL | 0 refills | Status: AC
Start: 2021-07-05 — End: ?

## 2021-07-05 NOTE — Plan of Care (Signed)

## 2021-07-05 NOTE — TOC Transition Note (Addendum)
Transition of Care [facility]) - CM/SW Discharge Note   Patient Details  Name: Rito Peng MRN: [id number] Date of Birth: 09-11-1945  Transition of Care [facility]) CM/SW Contact:  Dessa Phi, RN Phone Number: 07/05/2021, 11:39 AM   Clinical Narrative:  d/c back to [facility] w/hospice-[facility](active)-await d/c summary;going to [phone],tel# for [phone] [phone]. [facility] once all info completed.   -[facility] called. No further CM needs.  Final next level of care: Skilled Nursing Facility Barriers to Discharge: No Barriers Identified   Patient Goals and CMS Choice        Discharge Placement                       Discharge Plan and Services   Discharge Planning Services: CM Consult                                 Social Determinants of Health (SDOH) Interventions     Readmission Risk Interventions Readmission Risk Prevention Plan 07/05/2021  Medication Review ([place]) Complete  PCP or Specialist appointment within 3-5 days of discharge Complete  HRI or Home Care Consult Complete  SW Recovery Care/Counseling Consult Complete  Palliative Care Screening Complete  Skilled Nursing Facility Complete  Some recent data might be hidden

## 2021-07-05 NOTE — Progress Notes (Signed)
PHARMACY - PHYSICIAN COMMUNICATION CRITICAL VALUE ALERT - BLOOD CULTURE IDENTIFICATION (BCID)  Shannon [name] is an 76 y.o. male who presented to [facility] on 07/03[facility]2021 with a chief complaint of UTI  Assessment:  GPC, aerobic, 1/4, staph species  Name of physician (or Provider) Contacted: Johann Capers  Current antibiotics: CTX  Changes to prescribed antibiotics recommended:  None, probable contaminant  Results for orders placed or performed during the hospital encounter of 07/03/21  Blood Culture ID Panel ([place]) (Collected: 07/03/2021 12:40 PM)  Result Value Ref Range   Enterococcus faecalis NOT DETECTED NOT DETECTED   Enterococcus Faecium NOT DETECTED NOT DETECTED   Listeria monocytogenes NOT DETECTED NOT DETECTED   Staphylococcus species DETECTED (A) NOT DETECTED   Staphylococcus aureus (BCID) NOT DETECTED NOT DETECTED   Staphylococcus epidermidis NOT DETECTED NOT DETECTED   Staphylococcus lugdunensis NOT DETECTED NOT DETECTED   Streptococcus species NOT DETECTED NOT DETECTED   Streptococcus agalactiae NOT DETECTED NOT DETECTED   Streptococcus pneumoniae NOT DETECTED NOT DETECTED   Streptococcus pyogenes NOT DETECTED NOT DETECTED   A.calcoaceticus-baumannii NOT DETECTED NOT DETECTED   Bacteroides fragilis NOT DETECTED NOT DETECTED   Enterobacterales NOT DETECTED NOT DETECTED   Enterobacter cloacae complex NOT DETECTED NOT DETECTED   Escherichia coli NOT DETECTED NOT DETECTED   Klebsiella aerogenes NOT DETECTED NOT DETECTED   Klebsiella oxytoca NOT DETECTED NOT DETECTED   Klebsiella pneumoniae NOT DETECTED NOT DETECTED   Proteus species NOT DETECTED NOT DETECTED   Salmonella species NOT DETECTED NOT DETECTED   Serratia marcescens NOT DETECTED NOT DETECTED   Haemophilus influenzae NOT DETECTED NOT DETECTED   Neisseria meningitidis NOT DETECTED NOT DETECTED   Pseudomonas aeruginosa NOT DETECTED NOT DETECTED   Stenotrophomonas maltophilia NOT DETECTED NOT DETECTED    Candida albicans NOT DETECTED NOT DETECTED   Candida auris NOT DETECTED NOT DETECTED   Candida glabrata NOT DETECTED NOT DETECTED   Candida krusei NOT DETECTED NOT DETECTED   Candida parapsilosis NOT DETECTED NOT DETECTED   Candida tropicalis NOT DETECTED NOT DETECTED   Cryptococcus neoformans/gattii NOT DETECTED NOT DETECTED    Arley Phenix RPh 07/05/2021, 4:56 AM

## 2021-07-05 NOTE — Discharge Summary (Signed)
Physician Discharge Summary  Shannon OtaRonald Ray [name] VHQ:[id number] DOB: 1945-12-17 DOA: 07/03/2021  PCP: Jeoffrey MassedMcGowen, Philip H, MD  Admit date: 07/03/2021 Discharge date: 07/05/2021  Admitted From: Facility Disposition: Same  Recommendations for Outpatient Follow-up:  Follow up with PCP in 1-2 weeks Please obtain BMP/CBC in one week  Discharge Condition: Stable CODE STATUS: DNR Diet recommendation: As tolerated  Brief/Interim Summary: Shannon OtaRonald Ray [name] is a 76 y.o. male with medical history significant of HTN, chronic indwelling catheter, asthma, quadriplegia, Chiari malformation type I, HTN. History is from daughter. She reports that the patient's sore on his head and neck have worsened in the facility. She reports that his PEG fell out. She reports that he does not seem like himself and that he is less talkative. She requested transfer to the ED for evaluation.    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 -In the setting of infection versus progressive decline -Continue to discuss goals of care with family and palliative care team   UTI, setting of chronic foley, resolving -Completed 3-day course of IV antibiotics appropriate for UTI -Culture and speciation pending -Blood culture 1 out of 4 shows gram-positive cocci, likely contaminant, follow along, no current indication for treatment   Severe protein-calorie malnutrition Chronic dysphagia s/p PEG tube placement - PEG was replaced in ED - dietician for TF   Hypovolemic hyponatremia -Continue to advance fluids, free water flushes   Normocytic anemia -Chronic, at baseline   Quadraplegia Chronic head, sacral decubitus ulcers     - WOCN   Goals of care -Following with hospice, DNR per report  Discharge Instructions   Allergies as of 07/05/2021       Reactions   Iodine Swelling, Other (See Comments)   "NOT CT CONTRAST" "Allergic," per [facility]   Penicillins Itching, Swelling, Other (See Comments)   "**  tolerates cephalosporins; facial swelling, itchy throat" [facility]        Medication List     TAKE these medications    Acidophilus Lactobacillus Caps Place 1 capsule into feeding tube in the morning.   albuterol 108 (90 Base) MCG/ACT inhaler Commonly known as: VENTOLIN HFA Inhale 2 puffs into the lungs 2 (two) times daily.   ascorbic acid 500 MG tablet Commonly known as: VITAMIN C Place 1,000 mg into feeding tube daily.   aspirin 81 MG chewable tablet Place 1 tablet (81 mg total) into feeding tube every morning.   Cranberry 400 MG Caps Place 400 mg into feeding tube in the morning.   docusate 50 MG/5ML liquid Commonly known as: COLACE Place 100 mg into feeding tube 2 (two) times daily.   DULoxetine HCl 20 MG Csdr 20 mg at bedtime. per G-Tube,   feeding supplement (OSMOLITE 1.5 CAL) Liqd Place 1,000 mLs into feeding tube continuous. What changed:  how much to take when to take this additional instructions   nutrition supplement (JUVEN) Pack Place 1 packet into feeding tube 2 (two) times daily between meals. What changed:  when to take this additional instructions   Ferrous Sulfate 220 (44 Fe) MG/5ML Liqd Give 220 mg by tube 2 (two) times daily. Via G tube   finasteride 5 MG tablet Commonly known as: PROSCAR Take 1 tablet (5 mg total) by mouth daily. Per tube   free water Soln Place 100 mLs into feeding tube every 4 (four) hours.   gabapentin 300 MG capsule Commonly known as: NEURONTIN Take 1 capsule (300 mg total) by mouth at bedtime. * do not  crush* What changed: when to take this   guaiFENesin 100 MG/5ML liquid Commonly known as: ROBITUSSIN Place 100 mg into feeding tube 2 (two) times daily.   midodrine 10 MG tablet Commonly known as: PROAMATINE Place 1 tablet (10 mg total) into feeding tube 3 (three) times daily.   nystatin powder Commonly known as: MYCOSTATIN/NYSTOP Apply 1 application topically See admin instructions. Apply  topically once a day to sacrum peri-wound and surrounding skin- during wound dressing change   omeprazole 20 MG capsule Commonly known as: PRILOSEC Take 1 capsule (20 mg total) by mouth 2 (two) times daily before a meal. 20 mg, via G-Tube, 2 times daily x 4 weeks, then daily.   oxyCODONE 5 MG immediate release tablet Commonly known as: Oxy IR/ROXICODONE Take 1 tablet (5 mg total) by mouth every 6 (six) hours as needed for up to 3 days for severe pain or breakthrough pain. What changed:  when to take this reasons to take this   polyethylene glycol 17 g packet Commonly known as: MIRALAX / GLYCOLAX Place 17 g into feeding tube See admin instructions. Mix 17 grams of powder into 8 ounces of water and place into G-Tube once a day   Pro-Stat AWC Liqd Give 30 mLs by tube daily.   scopolamine 1 MG/3DAYS Commonly known as: TRANSDERM-SCOP Place 1 patch onto the skin every 3 (three) days.   tamsulosin 0.4 MG Caps capsule Commonly known as: FLOMAX Take 1 capsule (0.4 mg total) by mouth daily. What changed:  how to take this when to take this additional instructions   Xtampza ER 9 MG C12a Generic drug: oxyCODONE ER Take 1 capsule by mouth 2 (two) times daily.   zinc gluconate 50 MG tablet Place 50 mg into feeding tube daily.        Allergies  Allergen Reactions   Iodine Swelling and Other (See Comments)    "NOT CT CONTRAST" "Allergic," per [facility]   Penicillins Itching, Swelling and Other (See Comments)    "** tolerates cephalosporins; facial swelling, itchy throat" "Allergic," per [facility]    Consultations: None  Procedures/Studies: DG ABDOMEN PEG TUBE LOCATION  Result Date: 07/03/2021 CLINICAL DATA:  Provided history: Status post insertion of percutaneous endoscopic gastrostomy (PEG) tube. Additional history provided: 30 mL contrast injected via gastric tube. EXAM: ABDOMEN - 1 VIEW COMPARISON:  Intraprocedural fluoroscopic images from gastrostomy tube placement 01/19/2021. CT  abdomen/pelvis 12/08/2020. FINDINGS: A gastrostomy tube projects over the stomach. There is contrast opacification of the gastric fundus. No extraluminal contrast is identified. No dilated loops of small bowel are identified within the visualized abdomen. Moderate to large colonic stool burden within the visible abdomen suggesting constipation. IMPRESSION: A gastrostomy tube projects over the stomach. Contrast opacification of the gastric fundus. No extraluminal contrast is identified. Moderate-to-large colonic stool burden within the visible abdomen, suggesting constipation. Electronically Signed   By: Jackey Loge D.O.   On: 07/03/2021 14:04   DG Chest Portable 1 View  Result Date: 07/03/2021 CLINICAL DATA:  76 year old male with history of altered level of consciousness. EXAM: PORTABLE CHEST 1 VIEW COMPARISON:  Chest x-ray 12/09/2020. FINDINGS: Lung volumes are low. No consolidative airspace disease. No pleural effusions. No pneumothorax. No pulmonary nodule or mass noted. Pulmonary vasculature and the cardiomediastinal silhouette are within normal limits. IMPRESSION: 1. Low lung volumes without radiographic evidence of acute cardiopulmonary disease. Electronically Signed   By: Trudie Reed M.D.   On: 07/03/2021 12:17     Subjective: No acute issues or events overnight  Discharge Exam: Vitals:   07/04/21 2304 07/05/21 0324  BP: 103/61 119/78  Pulse: (!) 113 (!) 113  Resp: 18 20  Temp: 97.8 F (36.6 C) 99.7 F (37.6 C)  SpO2: 100% 100%   Vitals:   07/04/21 2120 07/04/21 2304 07/05/21 0324 07/05/21 0340  BP: 100/64 103/61 119/78   Pulse:  (!) 113 (!) 113   Resp: 17 18 20    Temp: 97.9 F (36.6 C) 97.8 F (36.6 C) 99.7 F (37.6 C)   TempSrc: Axillary Oral Oral   SpO2: 100% 100% 100%   Weight:    78.8 kg  Height:        General: Pt is alert, awake, not in acute distress Cardiovascular: RRR, S1/S2 +, no rubs, no gallops Respiratory: CTA bilaterally, no wheezing, no  rhonchi Abdominal: Soft, NT, ND, bowel sounds + Extremities: no edema, no cyanosis    The results of significant diagnostics from this hospitalization (including imaging, microbiology, ancillary and laboratory) are listed below for reference.     Microbiology: Recent Results (from the past 240 hour(s))  Urine Culture     Status: Abnormal (Preliminary result)   Collection Time: 07/03/21 12:25 PM   Specimen: Urine, Catheterized  Result Value Ref Range Status   Specimen Description   Final    URINE, CATHETERIZED Performed at [facility], [address], [place], [place] [address]    Special Requests   Final    NONE Performed at [facility], [address] [place], [place] [address]    Culture (A)  Final    40,000 COLONIES/mL GRAM NEGATIVE RODS IDENTIFICATION AND SUSCEPTIBILITIES TO FOLLOW Performed at [facility], [address], [place], [place] [address]    Report Status PENDING  Incomplete  Culture, blood (routine x 2)     Status: None (Preliminary result)   Collection Time: 07/03/21 12:25 PM   Specimen: BLOOD  Result Value Ref Range Status   Specimen Description   Final    BLOOD LEFT FOREARM Performed at [facility], [address], [place], [place] [address]    Special Requests   Final    BOTTLES DRAWN AEROBIC AND ANAEROBIC Blood Culture adequate volume Performed at [facility], [address] [place], [place] [address]    Culture   Final    NO GROWTH < 24 HOURS Performed at [facility], [address], [place], [place] [address]    Report Status PENDING  Incomplete  Resp Panel by RT-PCR (Flu A&B, Covid) Urine, Catheterized     Status: None   Collection Time: 07/03/21 12:25 PM   Specimen: Urine, Catheterized; Nasopharyngeal(NP) swabs in vial transport medium  Result Value Ref Range Status   SARS Coronavirus 2 by RT PCR NEGATIVE NEGATIVE Final    Comment:  (NOTE) SARS-CoV-2 target nucleic acids are NOT DETECTED.  The SARS-CoV-2 RNA is generally detectable in upper respiratory specimens during the acute phase of infection. The lowest concentration of SARS-CoV-2 viral copies this assay can detect is 138 copies/mL. A negative result does not preclude SARS-Cov-2 infection and should not be used as the sole basis for treatment or other patient management decisions. A negative result may occur with  improper specimen collection/handling, submission of specimen other than nasopharyngeal swab, presence of viral mutation(s) within the areas targeted by this assay, and inadequate number of viral copies(<138 copies/mL). A negative result must be combined with clinical observations, patient history, and epidemiological information. The expected  result is Negative.  Fact Sheet for Patients:  BloggerCourse.com  Fact Sheet for Healthcare Providers:  SeriousBroker.it  This test is no t yet approved or cleared by the [place] FDA and  has been authorized for detection and/or diagnosis of SARS-CoV-2 by FDA under an Emergency Use Authorization (EUA). This EUA will remain  in effect (meaning this test can be used) for the duration of the COVID-19 declaration under Section 564(b)(1) of the Act, 21 U.S.C.section 360bbb-3(b)(1), unless the authorization is terminated  or revoked sooner.       Influenza A by PCR NEGATIVE NEGATIVE Final   Influenza B by PCR NEGATIVE NEGATIVE Final    Comment: (NOTE) The Xpert Xpress SARS-CoV-2/FLU/RSV plus assay is intended as an aid in the diagnosis of influenza from Nasopharyngeal swab specimens and should not be used as a sole basis for treatment. Nasal washings and aspirates are unacceptable for Xpert Xpress SARS-CoV-2/FLU/RSV testing.  Fact Sheet for Patients: BloggerCourse.com  Fact Sheet for Healthcare  Providers: SeriousBroker.it  This test is not yet approved or cleared by the [place] FDA and has been authorized for detection and/or diagnosis of SARS-CoV-2 by FDA under an Emergency Use Authorization (EUA). This EUA will remain in effect (meaning this test can be used) for the duration of the COVID-19 declaration under Section 564(b)(1) of the Act, 21 U.S.C. section 360bbb-3(b)(1), unless the authorization is terminated or revoked.  Performed at [facility], [address], [place], [place] [address]   Culture, blood (routine x 2)     Status: None (Preliminary result)   Collection Time: 07/03/21 12:40 PM   Specimen: BLOOD  Result Value Ref Range Status   Specimen Description   Final    BLOOD RIGHT HAND Performed at [facility], [address], [place], [place] [address]    Special Requests   Final    BOTTLES DRAWN AEROBIC AND ANAEROBIC Blood Culture adequate volume Performed at [facility], [address] [place], [place] [address]    Culture  Setup Time   Final    GRAM POSITIVE COCCI AEROBIC BOTTLE ONLY CRITICAL RESULT CALLED TO, READ BACK BY AND VERIFIED WITH: PHARMD ELLEN JACKSON 07/05/21@4 :50 BY TW    Culture   Final    NO GROWTH < 24 HOURS Performed at [facility], [address], [place], [place] [address]    Report Status PENDING  Incomplete  Blood Culture ID Panel (Reflexed)     Status: Abnormal   Collection Time: 07/03/21 12:40 PM  Result Value Ref Range Status   Enterococcus faecalis NOT DETECTED NOT DETECTED Final   Enterococcus Faecium NOT DETECTED NOT DETECTED Final   Listeria monocytogenes NOT DETECTED NOT DETECTED Final   Staphylococcus species DETECTED (A) NOT DETECTED Final    Comment: CRITICAL RESULT CALLED TO, READ BACK BY AND VERIFIED WITH: PHARMD ELLEN JACKSON 07/05/21@4 :50 BY TW    Staphylococcus aureus (BCID) NOT DETECTED NOT DETECTED Final    Staphylococcus epidermidis NOT DETECTED NOT DETECTED Final   Staphylococcus lugdunensis NOT DETECTED NOT DETECTED Final   Streptococcus species NOT DETECTED NOT DETECTED Final   Streptococcus agalactiae NOT DETECTED NOT DETECTED Final   Streptococcus pneumoniae NOT DETECTED NOT DETECTED Final   Streptococcus pyogenes NOT DETECTED NOT DETECTED Final   A.calcoaceticus-baumannii NOT DETECTED NOT DETECTED Final   Bacteroides fragilis NOT DETECTED NOT DETECTED Final   Enterobacterales NOT DETECTED NOT DETECTED Final   Enterobacter cloacae complex NOT DETECTED NOT DETECTED Final  Escherichia coli NOT DETECTED NOT DETECTED Final   Klebsiella aerogenes NOT DETECTED NOT DETECTED Final   Klebsiella oxytoca NOT DETECTED NOT DETECTED Final   Klebsiella pneumoniae NOT DETECTED NOT DETECTED Final   Proteus species NOT DETECTED NOT DETECTED Final   Salmonella species NOT DETECTED NOT DETECTED Final   Serratia marcescens NOT DETECTED NOT DETECTED Final   Haemophilus influenzae NOT DETECTED NOT DETECTED Final   Neisseria meningitidis NOT DETECTED NOT DETECTED Final   Pseudomonas aeruginosa NOT DETECTED NOT DETECTED Final   Stenotrophomonas maltophilia NOT DETECTED NOT DETECTED Final   Candida albicans NOT DETECTED NOT DETECTED Final   Candida auris NOT DETECTED NOT DETECTED Final   Candida glabrata NOT DETECTED NOT DETECTED Final   Candida krusei NOT DETECTED NOT DETECTED Final   Candida parapsilosis NOT DETECTED NOT DETECTED Final   Candida tropicalis NOT DETECTED NOT DETECTED Final   Cryptococcus neoformans/gattii NOT DETECTED NOT DETECTED Final    Comment: Performed at [facility], [address] [place], [place] [address]     Labs: BNP (last 3 results) No results for input(s): BNP in the last 8760 hours. Basic Metabolic Panel: Recent Labs  Lab 07/03/21 1225 07/04/21 0419  NA 129* 130*  K 3.9 4.0  CL 94* 98  CO2 29 25  GLUCOSE 134* 93  BUN 19 22  CREATININE 0.45* 0.45*   CALCIUM 8.3* 8.3*   Liver Function Tests: Recent Labs  Lab 07/03/21 1225 07/04/21 0419  AST 22 23  ALT 14 14  ALKPHOS 96 86  BILITOT 0.5 0.7  PROT 10.5* 9.5*  ALBUMIN <1.5* <1.5*   No results for input(s): LIPASE, AMYLASE in the last 168 hours. No results for input(s): AMMONIA in the last 168 hours. CBC: Recent Labs  Lab 07/03/21 1225 07/04/21 0419  WBC 7.4 6.3  NEUTROABS 4.7  --   HGB 8.3* 7.2*  HCT 29.2* 25.2*  MCV 83.0 82.9  PLT 269 271   Cardiac Enzymes: No results for input(s): CKTOTAL, CKMB, CKMBINDEX, TROPONINI in the last 168 hours. BNP: Invalid input(s): POCBNP CBG: Recent Labs  Lab 07/03/21 1202 07/04/21 1609 07/04/21 1936 07/05/21 0608  GLUCAP 146* 115* 115* 170*   D-Dimer No results for input(s): DDIMER in the last 72 hours. Hgb A1c No results for input(s): HGBA1C in the last 72 hours. Lipid Profile No results for input(s): CHOL, HDL, LDLCALC, TRIG, CHOLHDL, LDLDIRECT in the last 72 hours. Thyroid function studies No results for input(s): TSH, T4TOTAL, T3FREE, THYROIDAB in the last 72 hours.  Invalid input(s): FREET3 Anemia work up No results for input(s): VITAMINB12, FOLATE, FERRITIN, TIBC, IRON, RETICCTPCT in the last 72 hours. Urinalysis    Component Value Date/Time   COLORURINE YELLOW 07/03/2021 1225   APPEARANCEUR TURBID (A) 07/03/2021 1225   LABSPEC 1.013 07/03/2021 1225   PHURINE 8.0 07/03/2021 1225   GLUCOSEU NEGATIVE 07/03/2021 1225   HGBUR MODERATE (A) 07/03/2021 1225   BILIRUBINUR NEGATIVE 07/03/2021 1225   KETONESUR NEGATIVE 07/03/2021 1225   PROTEINUR 30 (A) 07/03/2021 1225   NITRITE NEGATIVE 07/03/2021 1225   LEUKOCYTESUR LARGE (A) 07/03/2021 1225   Sepsis Labs Invalid input(s): PROCALCITONIN,  WBC,  LACTICIDVEN Microbiology Recent Results (from the past 240 hour(s))  Urine Culture     Status: Abnormal (Preliminary result)   Collection Time: 07/03/21 12:25 PM   Specimen: Urine, Catheterized  Result Value Ref Range  Status   Specimen Description   Final    URINE, CATHETERIZED Performed at [facility], [address] [place], [place] [address]    Special Requests   Final    NONE Performed at [facility], [address] [place], [place] [address]    Culture (A)  Final    40,000 COLONIES/mL GRAM NEGATIVE RODS IDENTIFICATION AND SUSCEPTIBILITIES TO FOLLOW Performed at [facility], [address] [place], [place] [address]    Report Status PENDING  Incomplete  Culture, blood (routine x 2)     Status: None (Preliminary result)   Collection Time: 07/03/21 12:25 PM   Specimen: BLOOD  Result Value Ref Range Status   Specimen Description   Final    BLOOD LEFT FOREARM Performed at [facility], [address] [place], [place] [address]    Special Requests   Final    BOTTLES DRAWN AEROBIC AND ANAEROBIC Blood Culture adequate volume Performed at [facility], [address] [place], [place] [address]    Culture   Final    NO GROWTH < 24 HOURS Performed at [facility], [address] [place], [place] [address]    Report Status PENDING  Incomplete  Resp Panel by RT-PCR (Flu A&B, Covid) Urine, Catheterized     Status: None   Collection Time: 07/03/21 12:25 PM   Specimen: Urine, Catheterized; Nasopharyngeal(NP) swabs in vial transport medium  Result Value Ref Range Status   SARS Coronavirus 2 by RT PCR NEGATIVE NEGATIVE Final    Comment: (NOTE) SARS-CoV-2 target nucleic acids are NOT DETECTED.  The SARS-CoV-2 RNA is generally detectable in upper respiratory specimens during the acute phase of infection. The lowest concentration of SARS-CoV-2 viral copies this assay can detect is 138 copies/mL. A negative result does not preclude SARS-Cov-2 infection and should not be used as the sole basis for treatment or other patient management decisions. A negative result may occur with  improper specimen  collection/handling, submission of specimen other than nasopharyngeal swab, presence of viral mutation(s) within the areas targeted by this assay, and inadequate number of viral copies(<138 copies/mL). A negative result must be combined with clinical observations, patient history, and epidemiological information. The expected result is Negative.  Fact Sheet for Patients:  BloggerCourse.comhttps://www.fda.gov/media/152166/download  Fact Sheet for Healthcare Providers:  SeriousBroker.ithttps://www.fda.gov/media/152162/download  This test is no t yet approved or cleared by the [place] FDA and  has been authorized for detection and/or diagnosis of SARS-CoV-2 by FDA under an Emergency Use Authorization (EUA). This EUA will remain  in effect (meaning this test can be used) for the duration of the COVID-19 declaration under Section 564(b)(1) of the Act, 21 U.S.C.section 360bbb-3(b)(1), unless the authorization is terminated  or revoked sooner.       Influenza A by PCR NEGATIVE NEGATIVE Final   Influenza B by PCR NEGATIVE NEGATIVE Final    Comment: (NOTE) The Xpert Xpress SARS-CoV-2/FLU/RSV plus assay is intended as an aid in the diagnosis of influenza from Nasopharyngeal swab specimens and should not be used as a sole basis for treatment. Nasal washings and aspirates are unacceptable for Xpert Xpress SARS-CoV-2/FLU/RSV testing.  Fact Sheet for Patients: BloggerCourse.comhttps://www.fda.gov/media/152166/download  Fact Sheet for Healthcare Providers: SeriousBroker.ithttps://www.fda.gov/media/152162/download  This test is not yet approved or cleared by the [place] FDA and has been authorized for detection and/or diagnosis of SARS-CoV-2 by FDA under an Emergency Use Authorization (EUA). This EUA will remain in effect (meaning this test can be used) for the duration of the COVID-19 declaration under Section 564(b)(1) of the Act, 21 U.S.C. section  360bbb-3(b)(1), unless the authorization is terminated or revoked.  Performed at [facility], [address], [place], [place] [address]   Culture, blood (routine x 2)     Status: None (Preliminary result)   Collection Time: 07/03/21 12:40 PM   Specimen: BLOOD  Result Value Ref Range Status   Specimen Description   Final    BLOOD RIGHT HAND Performed at [facility], [address] [place], [place] [address]    Special Requests   Final    BOTTLES DRAWN AEROBIC AND ANAEROBIC Blood Culture adequate volume Performed at [facility], [address], [place], [place] [address]    Culture  Setup Time   Final    GRAM POSITIVE COCCI AEROBIC BOTTLE ONLY CRITICAL RESULT CALLED TO, READ BACK BY AND VERIFIED WITH: PHARMD ELLEN JACKSON 07/05/21@4 :50 BY TW    Culture   Final    NO GROWTH < 24 HOURS Performed at [facility], [address], [place], [place] [address]    Report Status PENDING  Incomplete  Blood Culture ID Panel (Reflexed)     Status: Abnormal   Collection Time: 07/03/21 12:40 PM  Result Value Ref Range Status   Enterococcus faecalis NOT DETECTED NOT DETECTED Final   Enterococcus Faecium NOT DETECTED NOT DETECTED Final   Listeria monocytogenes NOT DETECTED NOT DETECTED Final   Staphylococcus species DETECTED (A) NOT DETECTED Final    Comment: CRITICAL RESULT CALLED TO, READ BACK BY AND VERIFIED WITH: PHARMD ELLEN JACKSON 07/05/21@4 :50 BY TW    Staphylococcus aureus (BCID) NOT DETECTED NOT DETECTED Final   Staphylococcus epidermidis NOT DETECTED NOT DETECTED Final   Staphylococcus lugdunensis NOT DETECTED NOT DETECTED Final   Streptococcus species NOT DETECTED NOT DETECTED Final   Streptococcus agalactiae NOT DETECTED NOT DETECTED Final   Streptococcus pneumoniae NOT DETECTED NOT DETECTED Final   Streptococcus pyogenes NOT DETECTED NOT DETECTED Final   A.calcoaceticus-baumannii NOT DETECTED NOT DETECTED Final   Bacteroides fragilis NOT DETECTED NOT DETECTED Final   Enterobacterales NOT DETECTED  NOT DETECTED Final   Enterobacter cloacae complex NOT DETECTED NOT DETECTED Final   Escherichia coli NOT DETECTED NOT DETECTED Final   Klebsiella aerogenes NOT DETECTED NOT DETECTED Final   Klebsiella oxytoca NOT DETECTED NOT DETECTED Final   Klebsiella pneumoniae NOT DETECTED NOT DETECTED Final   Proteus species NOT DETECTED NOT DETECTED Final   Salmonella species NOT DETECTED NOT DETECTED Final   Serratia marcescens NOT DETECTED NOT DETECTED Final   Haemophilus influenzae NOT DETECTED NOT DETECTED Final   Neisseria meningitidis NOT DETECTED NOT DETECTED Final   Pseudomonas aeruginosa NOT DETECTED NOT DETECTED Final   Stenotrophomonas maltophilia NOT DETECTED NOT DETECTED Final   Candida albicans NOT DETECTED NOT DETECTED Final   Candida auris NOT DETECTED NOT DETECTED Final   Candida glabrata NOT DETECTED NOT DETECTED Final   Candida krusei NOT DETECTED NOT DETECTED Final   Candida parapsilosis NOT DETECTED NOT DETECTED Final   Candida tropicalis NOT DETECTED NOT DETECTED Final   Cryptococcus neoformans/gattii NOT DETECTED NOT DETECTED Final    Comment: Performed at [facility], [address] [place], [place] [address]     Time coordinating discharge: Over 30 minutes  SIGNED:   Azucena Fallen, DO [facility] 07/05/2021, 7:35 AM Pager   If 7PM-7AM, please contact night-coverage [email or web address]

## 2021-07-05 NOTE — Progress Notes (Signed)
Pt discharged to [facility] per Dr. Natale Milch. Pt's IV site D/C'd and WDL. Pt's VSS. [facility] provided with home medication list, discharge instructions and prescriptions. Verbalized understanding. Report called to receiving RN. Verbalized understanding. Pt left floor via stretcher in stable condition accompanied by NT.

## 2021-07-05 NOTE — Plan of Care (Signed)
Discussed with plan of care for the evening, pain management and repositioning with some teach back displayed   Problem: Education: Goal: Knowledge of General Education information will improve Description: Including pain rating scale, medication(s)/side effects and non-pharmacologic comfort measures Outcome: Progressing   Problem: Health Behavior/Discharge Planning: Goal: Ability to manage health-related needs will improve Outcome: Progressing

## 2021-07-08 LAB — CULTURE, BLOOD (ROUTINE X 2)
Culture: NO GROWTH
Special Requests: ADEQUATE

## 2021-07-09 LAB — CULTURE, BLOOD (ROUTINE X 2): Special Requests: ADEQUATE

## 2021-09-11 ENCOUNTER — Emergency Department (HOSPITAL_COMMUNITY)

## 2021-09-11 ENCOUNTER — Inpatient Hospital Stay (HOSPITAL_COMMUNITY)
Admission: EM | Admit: 2021-09-11 | Discharge: 2021-09-22 | DRG: 871 | Disposition: E | Source: Skilled Nursing Facility | Attending: Family Medicine | Admitting: Family Medicine

## 2021-09-11 ENCOUNTER — Encounter (HOSPITAL_COMMUNITY): Payer: Self-pay

## 2021-09-11 DIAGNOSIS — R6520 Severe sepsis without septic shock: Secondary | ICD-10-CM | POA: Diagnosis present

## 2021-09-11 DIAGNOSIS — Z7401 Bed confinement status: Secondary | ICD-10-CM | POA: Diagnosis not present

## 2021-09-11 DIAGNOSIS — L89899 Pressure ulcer of other site, unspecified stage: Secondary | ICD-10-CM | POA: Diagnosis present

## 2021-09-11 DIAGNOSIS — Z87891 Personal history of nicotine dependence: Secondary | ICD-10-CM

## 2021-09-11 DIAGNOSIS — L89154 Pressure ulcer of sacral region, stage 4: Secondary | ICD-10-CM | POA: Diagnosis present

## 2021-09-11 DIAGNOSIS — J45909 Unspecified asthma, uncomplicated: Secondary | ICD-10-CM | POA: Diagnosis present

## 2021-09-11 DIAGNOSIS — G8250 Quadriplegia, unspecified: Secondary | ICD-10-CM | POA: Diagnosis present

## 2021-09-11 DIAGNOSIS — Z515 Encounter for palliative care: Secondary | ICD-10-CM | POA: Diagnosis not present

## 2021-09-11 DIAGNOSIS — A411 Sepsis due to other specified staphylococcus: Principal | ICD-10-CM | POA: Diagnosis present

## 2021-09-11 DIAGNOSIS — Z8249 Family history of ischemic heart disease and other diseases of the circulatory system: Secondary | ICD-10-CM | POA: Diagnosis not present

## 2021-09-11 DIAGNOSIS — N179 Acute kidney failure, unspecified: Secondary | ICD-10-CM | POA: Diagnosis not present

## 2021-09-11 DIAGNOSIS — M4628 Osteomyelitis of vertebra, sacral and sacrococcygeal region: Secondary | ICD-10-CM | POA: Diagnosis present

## 2021-09-11 DIAGNOSIS — I10 Essential (primary) hypertension: Secondary | ICD-10-CM | POA: Diagnosis present

## 2021-09-11 DIAGNOSIS — R6521 Severe sepsis with septic shock: Secondary | ICD-10-CM | POA: Diagnosis not present

## 2021-09-11 DIAGNOSIS — Z66 Do not resuscitate: Secondary | ICD-10-CM | POA: Diagnosis present

## 2021-09-11 DIAGNOSIS — Z880 Allergy status to penicillin: Secondary | ICD-10-CM | POA: Diagnosis not present

## 2021-09-11 DIAGNOSIS — R4182 Altered mental status, unspecified: Secondary | ICD-10-CM | POA: Diagnosis not present

## 2021-09-11 DIAGNOSIS — G935 Compression of brain: Secondary | ICD-10-CM | POA: Diagnosis present

## 2021-09-11 DIAGNOSIS — N319 Neuromuscular dysfunction of bladder, unspecified: Secondary | ICD-10-CM | POA: Diagnosis present

## 2021-09-11 DIAGNOSIS — J9601 Acute respiratory failure with hypoxia: Secondary | ICD-10-CM | POA: Diagnosis present

## 2021-09-11 DIAGNOSIS — Z79899 Other long term (current) drug therapy: Secondary | ICD-10-CM

## 2021-09-11 DIAGNOSIS — Z7982 Long term (current) use of aspirin: Secondary | ICD-10-CM

## 2021-09-11 DIAGNOSIS — L89810 Pressure ulcer of head, unstageable: Secondary | ICD-10-CM | POA: Diagnosis present

## 2021-09-11 DIAGNOSIS — Z79891 Long term (current) use of opiate analgesic: Secondary | ICD-10-CM | POA: Diagnosis not present

## 2021-09-11 DIAGNOSIS — Z20822 Contact with and (suspected) exposure to covid-19: Secondary | ICD-10-CM | POA: Diagnosis present

## 2021-09-11 DIAGNOSIS — A419 Sepsis, unspecified organism: Secondary | ICD-10-CM | POA: Diagnosis not present

## 2021-09-11 DIAGNOSIS — Z981 Arthrodesis status: Secondary | ICD-10-CM

## 2021-09-11 DIAGNOSIS — Z91048 Other nonmedicinal substance allergy status: Secondary | ICD-10-CM | POA: Diagnosis not present

## 2021-09-11 LAB — URINALYSIS, ROUTINE W REFLEX MICROSCOPIC
Bilirubin Urine: NEGATIVE
Glucose, UA: NEGATIVE mg/dL
Ketones, ur: NEGATIVE mg/dL
Nitrite: POSITIVE — AB
Protein, ur: 30 mg/dL — AB
Specific Gravity, Urine: 1.01 (ref 1.005–1.030)
pH: 6 (ref 5.0–8.0)

## 2021-09-11 LAB — URINALYSIS, MICROSCOPIC (REFLEX)
Squamous Epithelial / HPF: NONE SEEN (ref 0–5)
WBC, UA: >50 WBC/hpf (ref 0–5)

## 2021-09-11 LAB — CBC WITH DIFFERENTIAL/PLATELET
Abs Immature Granulocytes: 0.15 10*3/uL — ABNORMAL HIGH (ref 0.00–0.07)
Basophils Absolute: 0 10*3/uL (ref 0.0–0.1)
Basophils Relative: 0 %
Eosinophils Absolute: 0.2 10*3/uL (ref 0.0–0.5)
Eosinophils Relative: 2 %
HCT: 27.8 % — ABNORMAL LOW (ref 39.0–52.0)
Hemoglobin: 7.6 g/dL — ABNORMAL LOW (ref 13.0–17.0)
Immature Granulocytes: 1 %
Lymphocytes Relative: 11 %
Lymphs Abs: 1.3 10*3/uL (ref 0.7–4.0)
MCH: 22.8 pg — ABNORMAL LOW (ref 26.0–34.0)
MCHC: 27.3 g/dL — ABNORMAL LOW (ref 30.0–36.0)
MCV: 83.5 fL (ref 80.0–100.0)
Monocytes Absolute: 1.5 10*3/uL — ABNORMAL HIGH (ref 0.1–1.0)
Monocytes Relative: 12 %
Neutro Abs: 9.1 10*3/uL — ABNORMAL HIGH (ref 1.7–7.7)
Neutrophils Relative %: 74 %
Platelets: 233 10*3/uL (ref 150–400)
RBC: 3.33 MIL/uL — ABNORMAL LOW (ref 4.22–5.81)
RDW: 18.4 % — ABNORMAL HIGH (ref 11.5–15.5)
WBC: 12.2 10*3/uL — ABNORMAL HIGH (ref 4.0–10.5)
nRBC: 0 % (ref 0.0–0.2)

## 2021-09-11 LAB — CREATININE, SERUM
Creatinine, Ser: 0.77 mg/dL (ref 0.61–1.24)
GFR, Estimated: >60 mL/min (ref >60)

## 2021-09-11 LAB — CBC
HCT: 30.1 % — ABNORMAL LOW (ref 39.0–52.0)
Hemoglobin: 8 g/dL — ABNORMAL LOW (ref 13.0–17.0)
MCH: 22.6 pg — ABNORMAL LOW (ref 26.0–34.0)
MCHC: 26.6 g/dL — ABNORMAL LOW (ref 30.0–36.0)
MCV: 85 fL (ref 80.0–100.0)
Platelets: 189 10*3/uL (ref 150–400)
RBC: 3.54 MIL/uL — ABNORMAL LOW (ref 4.22–5.81)
RDW: 18.3 % — ABNORMAL HIGH (ref 11.5–15.5)
WBC: 21 10*3/uL — ABNORMAL HIGH (ref 4.0–10.5)
nRBC: 0.2 % (ref 0.0–0.2)

## 2021-09-11 LAB — COMPREHENSIVE METABOLIC PANEL
ALT: 20 U/L (ref 0–44)
AST: 39 U/L (ref 15–41)
Albumin: <1.5 g/dL — ABNORMAL LOW (ref 3.5–5.0)
Alkaline Phosphatase: 100 U/L (ref 38–126)
Anion gap: 3 — ABNORMAL LOW (ref 5–15)
BUN: 50 mg/dL — ABNORMAL HIGH (ref 8–23)
CO2: 29 mmol/L (ref 22–32)
Calcium: 8.6 mg/dL — ABNORMAL LOW (ref 8.9–10.3)
Chloride: 106 mmol/L (ref 98–111)
Creatinine, Ser: 0.82 mg/dL (ref 0.61–1.24)
GFR, Estimated: >60 mL/min (ref >60)
Glucose, Bld: 185 mg/dL — ABNORMAL HIGH (ref 70–99)
Potassium: 4.3 mmol/L (ref 3.5–5.1)
Sodium: 138 mmol/L (ref 135–145)
Total Bilirubin: 0.4 mg/dL (ref 0.3–1.2)
Total Protein: 11.1 g/dL — ABNORMAL HIGH (ref 6.5–8.1)

## 2021-09-11 LAB — RESP PANEL BY RT-PCR (FLU A&B, COVID) ARPGX2
Influenza A by PCR: NEGATIVE
Influenza B by PCR: NEGATIVE
SARS Coronavirus 2 by RT PCR: NEGATIVE

## 2021-09-11 LAB — LACTIC ACID, PLASMA
Lactic Acid, Venous: 1.6 mmol/L (ref 0.5–1.9)
Lactic Acid, Venous: 1.3 mmol/L (ref 0.5–1.9)

## 2021-09-11 LAB — PROTIME-INR
INR: 1.4 — ABNORMAL HIGH (ref 0.8–1.2)
Prothrombin Time: 17.2 seconds — ABNORMAL HIGH (ref 11.4–15.2)

## 2021-09-11 LAB — APTT: aPTT: 35 seconds (ref 24–36)

## 2021-09-11 LAB — CBG MONITORING, ED: Glucose-Capillary: 213 mg/dL — ABNORMAL HIGH (ref 70–99)

## 2021-09-11 IMAGING — DX DG CHEST 1V PORT
1 series · 1 of 1 positions shown · non-contrast
Comparison: [DATE]

CLINICAL DATA: Altered mental status, labored breathing

EXAM:
PORTABLE CHEST 1 VIEW

[chest ap]
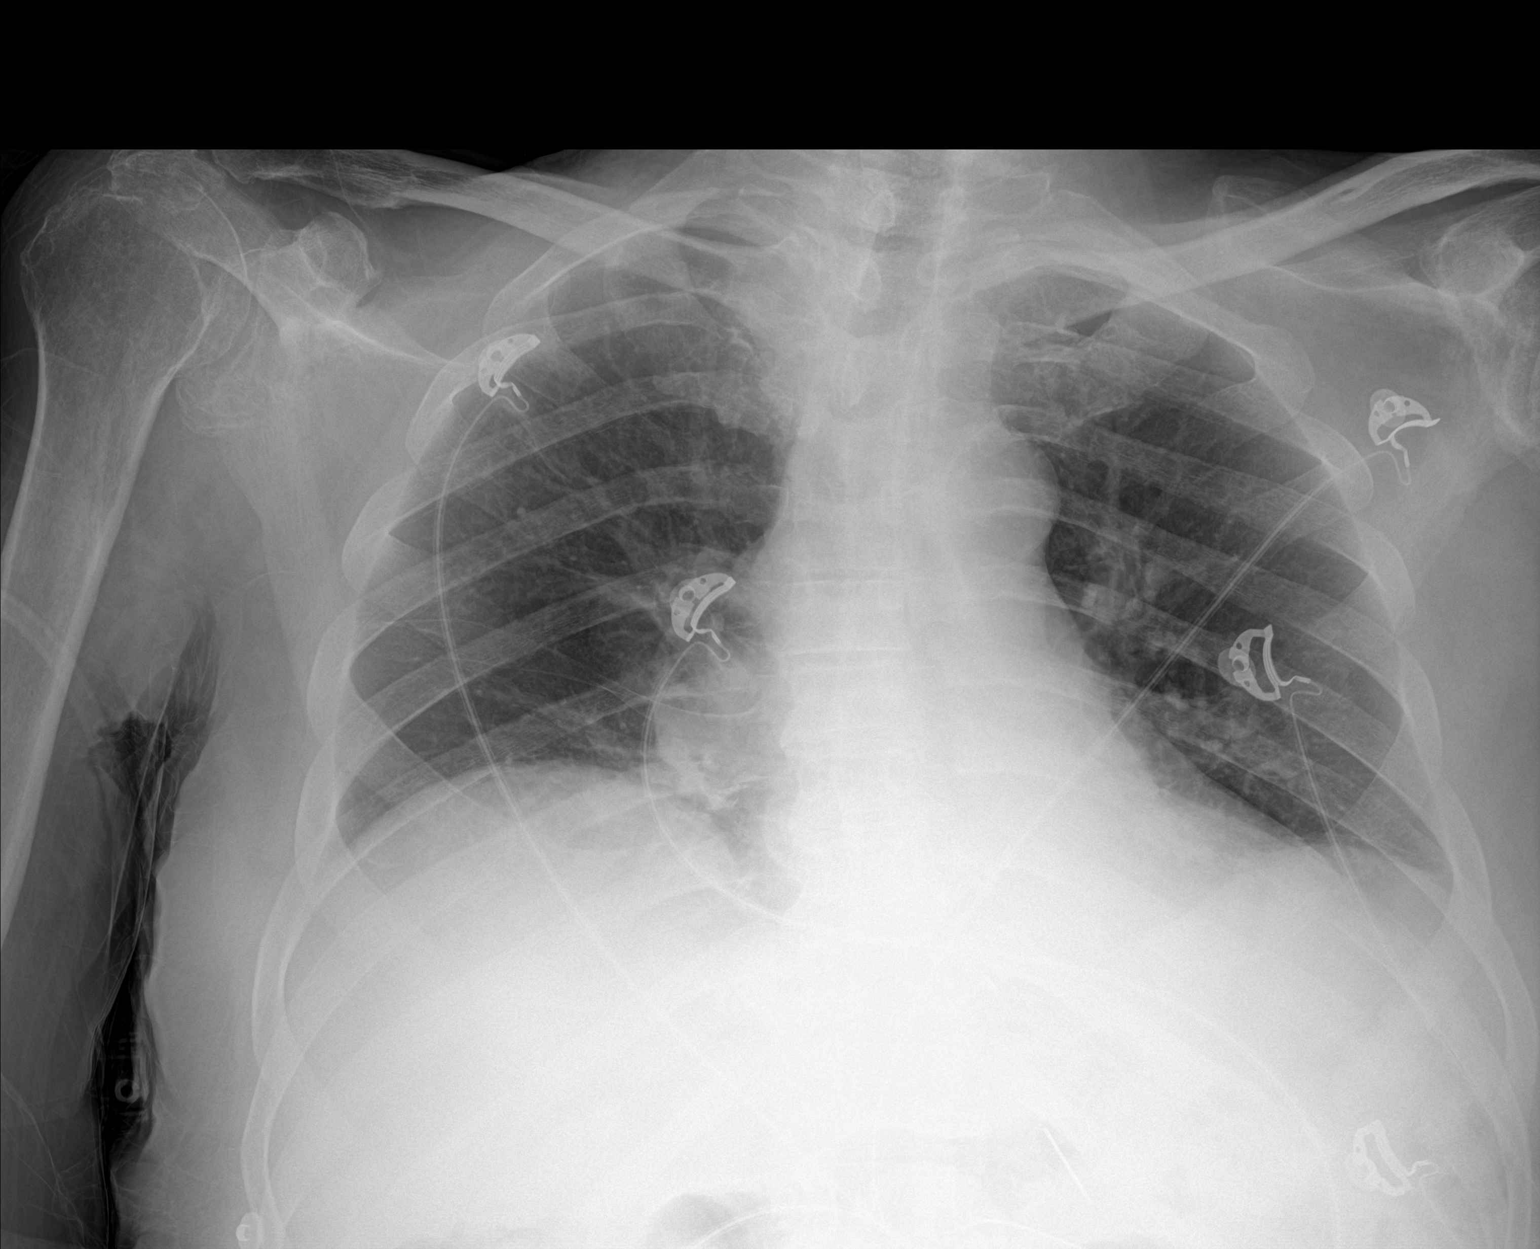

[1 of 1 positions shown; findings below may reference images not displayed]

FINDINGS: Stable heart size. Low lung volumes. No focal airspace
consolidation, pleural effusion, or pneumothorax. Degenerative
changes of both shoulders.
IMPRESSION: Low lung volumes without acute cardiopulmonary findings.

## 2021-09-11 MED ORDER — LACTATED RINGERS IV BOLUS (SEPSIS)
1000.0000 mL | Freq: Once | INTRAVENOUS | Status: AC
  Administered 2021-09-11: 1000 mL via INTRAVENOUS

## 2021-09-11 MED ORDER — LACTATED RINGERS IV SOLN: INTRAVENOUS | Status: DC

## 2021-09-11 MED ORDER — VANCOMYCIN HCL 750 MG/150ML IV SOLN
750.0000 mg | Freq: Two times a day (BID) | INTRAVENOUS | Status: DC
  Administered 2021-09-12: 750 mg via INTRAVENOUS
  Filled 2021-09-11 (×2): qty 150

## 2021-09-11 MED ORDER — ENOXAPARIN SODIUM 40 MG/0.4ML IJ SOSY
40.0000 mg | PREFILLED_SYRINGE | INTRAMUSCULAR | Status: DC
  Administered 2021-09-12: 40 mg via SUBCUTANEOUS
  Filled 2021-09-11: qty 0.4

## 2021-09-11 MED ORDER — VANCOMYCIN HCL IN DEXTROSE 1-5 GM/200ML-% IV SOLN
1000.0000 mg | Freq: Once | INTRAVENOUS | Status: AC
  Administered 2021-09-11: 1000 mg via INTRAVENOUS
  Filled 2021-09-11: qty 200

## 2021-09-11 MED ORDER — ACETAMINOPHEN 325 MG PO TABS: 650.0000 mg | ORAL_TABLET | Freq: Four times a day (QID) | ORAL | Status: DC | PRN

## 2021-09-11 MED ORDER — SODIUM CHLORIDE 0.9 % IV SOLN
2.0000 g | Freq: Three times a day (TID) | INTRAVENOUS | Status: DC
  Administered 2021-09-11 – 2021-09-12 (×2): 2 g via INTRAVENOUS
  Filled 2021-09-11 (×3): qty 2

## 2021-09-11 MED ORDER — SCOPOLAMINE 1 MG/3DAYS TD PT72
1.0000 | MEDICATED_PATCH | TRANSDERMAL | Status: DC
  Administered 2021-09-12: 1.5 mg via TRANSDERMAL
  Filled 2021-09-11 (×2): qty 1

## 2021-09-11 MED ORDER — LACTATED RINGERS IV SOLN: INTRAVENOUS | Status: AC

## 2021-09-11 MED ORDER — ACETAMINOPHEN 650 MG RE SUPP: 650.0000 mg | Freq: Four times a day (QID) | RECTAL | Status: DC | PRN

## 2021-09-11 MED ORDER — SODIUM CHLORIDE 0.9 % IV SOLN
2.0000 g | Freq: Once | INTRAVENOUS | Status: AC
  Administered 2021-09-11: 2 g via INTRAVENOUS
  Filled 2021-09-11: qty 2

## 2021-09-11 MED ORDER — LACTATED RINGERS IV BOLUS (SEPSIS)
500.0000 mL | Freq: Once | INTRAVENOUS | Status: AC
  Administered 2021-09-11: 500 mL via INTRAVENOUS

## 2021-09-11 MED ORDER — ONDANSETRON HCL 4 MG/2ML IJ SOLN: 4.0000 mg | Freq: Four times a day (QID) | INTRAMUSCULAR | Status: DC | PRN

## 2021-09-11 MED ORDER — ONDANSETRON HCL 4 MG PO TABS: 4.0000 mg | ORAL_TABLET | Freq: Four times a day (QID) | ORAL | Status: DC | PRN

## 2021-09-11 NOTE — ED Triage Notes (Signed)
Per EMS, Pt, from [facility], presents after increasing AMS and labored breathing x 3 days.  Per facilty, Pt can typically follow commands. Pt noted to have multiple pressure wounds and indwelling catheter.  Pt is quadriplegic and on Hospice.   ? ?MOST form at bedside.   ? ?Pt recently placed on 3L [place].   ?

## 2021-09-11 NOTE — ED Provider Notes (Signed)
?[facility] DEPT ?Provider Note ? ? ?CSN: [id number] ?Arrival date & time: 09/20/2021  1435 ? ?  ? ?History ? ?Chief Complaint  ?Patient presents with  ? Altered Mental Status  ? Code Sepsis  ? ? ?Shannon [name] is a 76 y.o. male. Patient brought to emergency department via EMS due to worsening mental status. The patient is currently a hospice patient and resident of [facility]. The patient is quadriplegic. The patient's daughter at bedside states that she was called by the facility and informed that his vital signs were abnormal. He is reported to obey commands at baseline. Currently he responds only to painful stimuli. This has apparently been worsening over the past three days.  He was admitted for septic shock for two weeks at the end of June 2022. PMH significant for quadriplegia, neurogenic bladder, sacral osteomyelitis, stage IV sacral decubitus ulcer, bed bound, severe protein calorie malnutrition.  ? ?HPI ? ?  ? ?Home Medications ?Prior to Admission medications   ?Medication Sig Start Date End Date Taking? Authorizing Provider  ?Acidophilus Lactobacillus CAPS Place 1 capsule into feeding tube in the morning.    [provider]  ?albuterol (VENTOLIN HFA) 108 (90 Base) MCG/ACT inhaler Inhale 2 puffs into the lungs 2 (two) times daily.    [provider]  ?Amino Acids-Protein Hydrolys (PRO-STAT AWC) LIQD Give 30 mLs by tube daily.    [provider]  ?ascorbic acid (VITAMIN C) 500 MG tablet Place 1,000 mg into feeding tube daily.    [provider]  ?aspirin 81 MG chewable tablet Place 1 tablet (81 mg total) into feeding tube every morning. 03/17/21   Eugenie Filler, MD  ?Cranberry 400 MG CAPS Place 400 mg into feeding tube in the morning.    [provider]  ?docusate (COLACE) 50 MG/5ML liquid Place 100 mg into feeding tube 2 (two) times daily.    [provider]  ?DULoxetine HCl 20 MG CSDR 20 mg at bedtime. per G-Tube,     [provider]  ?Ferrous Sulfate 220 (44 Fe) MG/5ML LIQD Give 220 mg by tube 2 (two) times daily. Via G tube    [provider]  ?finasteride (PROSCAR) 5 MG tablet Take 1 tablet (5 mg total) by mouth daily. Per tube 03/11/21   Eugenie Filler, MD  ?gabapentin (NEURONTIN) 300 MG capsule Take 1 capsule (300 mg total) by mouth at bedtime. * do not crush* 07/05/21   Little Ishikawa, MD  ?guaiFENesin (ROBITUSSIN) 100 MG/5ML liquid Place 100 mg into feeding tube 2 (two) times daily.    [provider]  ?midodrine (PROAMATINE) 10 MG tablet Place 1 tablet (10 mg total) into feeding tube 3 (three) times daily. 11/18/20   Eugenie Filler, MD  ?nutrition supplement, JUVEN, Fanny Dance) PACK Place 1 packet into feeding tube 2 (two) times daily between meals. ?Patient taking differently: Place 1 packet into feeding tube See admin instructions. Mix the contents of 1 packet into 6 ounces of fluid and place into tube 2 times a day between meals 11/18/20   Eugenie Filler, MD  ?Nutritional Supplements (FEEDING SUPPLEMENT, OSMOLITE 1.5 CAL,) LIQD Place 1,000 mLs into feeding tube continuous. ?Patient taking differently: Place 75 mL/hr into feeding tube See admin instructions. 75 ml's/hr, via gastric tube, for 22 hours with a break of 2 hours for personal care 11/18/20   Eugenie Filler, MD  ?nystatin (MYCOSTATIN/NYSTOP) powder Apply 1 application topically See admin instructions. Apply topically  once a day to sacrum peri-wound and surrounding skin- during wound dressing change    [provider]  ?omeprazole (PRILOSEC) 20 MG capsule Take 1 capsule (20 mg total) by mouth 2 (two) times daily before a meal. 20 mg, via G-Tube, 2 times daily x 4 weeks, then daily. 03/10/21   Eugenie Filler, MD  ?polyethylene glycol (MIRALAX / GLYCOLAX) 17 g packet Place 17 g into feeding tube See admin instructions. Mix 17 grams of powder into 8 ounces of water and place into G-Tube once a day    [provider]  ?scopolamine (TRANSDERM-SCOP) 1 MG/3DAYS Place 1 patch onto the skin every 3 (three) days.    [provider]  ?tamsulosin (FLOMAX) 0.4 MG CAPS capsule Take 1 capsule (0.4 mg total) by mouth daily. ?Patient taking differently: 0.4 mg See admin instructions. 0.4 mg per G-Tube every morning 04/28/20   Newman Pies, MD  ?Water For Irrigation, Sterile (FREE WATER) SOLN Place 100 mLs into feeding tube every 4 (four) hours. 03/10/21   Eugenie Filler, MD  ?XTAMPZA ER 9 MG C12A Take 1 capsule by mouth 2 (two) times daily. 06/22/21   [provider]  ?zinc gluconate 50 MG tablet Place 50 mg into feeding tube daily.    [provider]  ?   ? ?Allergies    ?Iodine and Penicillins   ? ?Review of Systems   ?Review of Systems  ?Reason unable to perform ROS: Patient's altered mental status.  ? ?Physical Exam ?Updated Vital Signs ?BP 121/63   Pulse (!) 113   Temp (!) 101.4 ?F (38.6 ?C) (Rectal)   Resp (!) 22   Ht 6' (1.829 m)   Wt 79.4 kg   SpO2 99%   BMI 23.73 kg/m?  ?Physical Exam ?Constitutional:   ?   Appearance: He is ill-appearing.  ?Eyes:  ?   Comments: PERRL  ?Cardiovascular:  ?   Rate and Rhythm: Regular rhythm. Tachycardia present.  ?Pulmonary:  ?   Breath sounds: Rhonchi present.  ?Abdominal:  ?   Palpations: Abdomen is soft.  ?Skin: ?   General: Skin is warm.  ?   Coloration: Skin is pale.  ?Neurological:  ?   GCS: GCS eye subscore is 1. GCS verbal subscore is 2. GCS motor subscore is 4.  ? ? ?ED Results / Procedures / Treatments   ?Labs ?(all labs ordered are listed, but only abnormal results are displayed) ?Labs Reviewed  ?COMPREHENSIVE METABOLIC PANEL - Abnormal; Notable for the following components:  ?    Result Value  ? Glucose, Bld 185 (*)   ? BUN 50 (*)   ? Calcium 8.6 (*)   ? Total Protein 11.1 (*)   ? Albumin <1.5 (*)   ? Anion gap 3 (*)   ? All other components within normal limits  ?CBC WITH DIFFERENTIAL/PLATELET - Abnormal; Notable for the following  components:  ? WBC 12.2 (*)   ? RBC 3.33 (*)   ? Hemoglobin 7.6 (*)   ? HCT 27.8 (*)   ? MCH 22.8 (*)   ? MCHC 27.3 (*)   ? RDW 18.4 (*)   ? Neutro Abs 9.1 (*)   ? Monocytes Absolute 1.5 (*)   ? Abs Immature Granulocytes 0.15 (*)   ? All other components within normal limits  ?PROTIME-INR - Abnormal; Notable for the following components:  ? Prothrombin Time 17.2 (*)   ? INR 1.4 (*)   ? All other components within normal  limits  ?CBG MONITORING, ED - Abnormal; Notable for the following components:  ? Glucose-Capillary 213 (*)   ? All other components within normal limits  ?RESP PANEL BY RT-PCR (FLU A&B, COVID) [id number]  ?CULTURE, BLOOD (ROUTINE X 2)  ?CULTURE, BLOOD (ROUTINE X 2)  ?URINE CULTURE  ?LACTIC ACID, PLASMA  ?APTT  ?LACTIC ACID, PLASMA  ?URINALYSIS, ROUTINE W REFLEX MICROSCOPIC  ? ? ?EKG ?EKG Interpretation ? ?Date/Time:  Tuesday September 11 2021 14:44:28 EDT ?Ventricular Rate:  113 ?PR Interval:  161 ?QRS Duration: 94 ?QT Interval:  313 ?QTC Calculation: 430 ?R Axis:   63 ?Text Interpretation: Sinus tachycardia Consider right atrial enlargement Low voltage with right axis deviation Confirmed by Dene Gentry [phone]) on 09/04/2021 4:21:10 PM ? ?Radiology ?DG Chest Port 1 View ? ?Result Date: 09/07/2021 ?CLINICAL DATA:  Altered mental status, labored breathing EXAM: PORTABLE CHEST 1 VIEW COMPARISON:  07/03/2021 FINDINGS: Stable heart size. Low lung volumes. No focal airspace consolidation, pleural effusion, or pneumothorax. Degenerative changes of both shoulders. IMPRESSION: Low lung volumes without acute cardiopulmonary findings. Electronically Signed   By: Davina Poke D.O.   On: 08/23/2021 15:58   ? ?Procedures ?Marland KitchenCritical Care ?Performed by: Dorothyann Peng, PA-C ?Authorized by: Dorothyann Peng, PA-C  ? ?Critical care provider statement:  ?  Critical care time (minutes):  30 ?  Critical care time was exclusive of:  Separately billable procedures and treating other patients ?  Critical care was necessary  to treat or prevent imminent or life-threatening deterioration of the following conditions:  Sepsis ?  Critical care was time spent personally by me on the following activities:  Development of treatment plan w

## 2021-09-11 NOTE — H&P (Addendum)
? ?                                                                           TRH H&P ? ? ? Patient Demographics:  ? ? Shannon [name], is a 76 y.o. male  MRN: [id number]  DOB - [birth year] ? ?Admit Date - 09/05/2021 ? ? ? ?Outpatient Primary MD for the patient is McGowen, Adrian Blackwater, MD ? ?Patient coming from: Skilled nursing facility ? ?Chief complaint-altered mental status ? ? HPI:  ? ? Shannon [name]  is a 76 y.o. male, with medical history of hypertension, quadriplegia, chronic indwelling Foley catheter, asthma, chiari malformation type I, stage IV decubitus ulcer, sacral osteomyelitis who is currently enrolled under hospice care at [facility] was brought to hospital for altered mental status.  At baseline patient is quadriplegic however reportedly obeys commands at baseline.  Apparently he has been worsening over past 3 days.  He was having labored breathing with altered mental status.  Currently not been responding to painful stimuli. ? ?In the ED patient was found to be in sepsis, was found to be febrile, tachycardic and tachypneic.  WBC 1212.2, hemoglobin 7.6.  Lactic acid 1.6, repeat lactic acid 1.3 patient started on vancomycin and cefepime per pharmacy consultation.  Also found to have large decubitus ulcer at the posterior scalp. ? ? ? Review of systems:  ?  ?As in HPI, rest of review of systems unobtainable as patient unable to provide any history. ? ? ? Past History of the following :  ? ? ?Past Medical History:  ?Diagnosis Date  ? Asthma   ? 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 Sherwood Gambler, 1992-->surg at [facility].  Summer 2021->Cervicalgia,arm pain, hand atrophy RUE wkness, hyperreflex (Dr. Rosalita Levan MRI: extensive cord atrophy and spinal and foraminal stenosis->to get surgery 03/2020  ? Hay fever   ? Hematuria   ? Hypertension   ? Osteoarthritis of both hands   ?   ? ?Past Surgical History:  ?Procedure Laterality Date  ?  ANTERIOR CERVICAL DECOMP/DISCECTOMY FUSION N/A 04/05/2020  ? Procedure: ANTERIOR CERVICAL DECOMPRESSION/DISCECTOMY FUSION, INTERBODY PROSTHESIS, PLATE/SCREWS CERVICAL THREE-CERVICAL FOUR, CERVICAL FOUR- CERVICAL FIVE;  Surgeon: Newman Pies, MD;  Location: [facility];  Service: Neurosurgery;  Laterality: N/A;  ? ANTERIOR CERVICAL DECOMP/DISCECTOMY FUSION N/A 04/22/2020  ? Procedure: Reexploration of anterior cervical wound for epidural hematoma;  Surgeon: Kary Kos, MD;  Location: [facility];  Service: Neurosurgery;  Laterality: N/A;  ? BACK SURGERY    ? CYSTOSCOPY N/A 11/13/2020  ? Procedure: CYSTOSCOPY, CLOT EVACUATION, FULGURATION;  Surgeon: Robley Fries, MD;  Location: [facility];  Service: Urology;  Laterality: N/A;  ? INCISION AND DRAINAGE Left 2012  ? L hand infection  ? IR GASTROSTOMY TUBE MOD SED  07/05/2020  ? IR REPLC GASTRO/COLONIC TUBE PERCUT W/FLUORO  01/19/2021  ? ROTATOR CUFF REPAIR Right   ? x 3  ? SPINE SURGERY    ? TRANSURETHRAL RESECTION OF PROSTATE N/A 03/06/2021  ? Procedure: CYSTOSCOPY, FULGURATION OF PROSTATE, CLOT EVACUATION;  Surgeon: Lucas Mallow, MD;  Location: [facility];  Service: Urology;  Laterality: N/A;  ? ? ? ?  Social History:  ? ? ?  ?Social History  ? ?Tobacco Use  ? Smoking status: Former  ? Smokeless tobacco: Never  ?Substance Use Topics  ? Alcohol use: Not Currently  ?  ? ? ? Family History :  ? ?  ?Family History  ?Problem Relation Age of Onset  ? Heart attack Mother   ? Heart disease Mother   ? High blood pressure Mother   ? Alcohol abuse Father   ? Cancer Father   ? Diabetes Father   ? High blood pressure Father   ? ? ? ? Home Medications:  ? ?Prior to Admission medications   ?Medication Sig Start Date End Date Taking? Authorizing Provider  ?ascorbic acid (VITAMIN C) 500 MG tablet Place 1,000 mg into feeding tube daily.   Yes [provider]  ?aspirin 81 MG chewable tablet Place 1 tablet (81 mg total) into feeding tube every morning. 03/17/21  Yes Eugenie Filler, MD   ?atenolol (TENORMIN) 25 MG tablet Place 12.5 mg into feeding tube 2 (two) times daily.   Yes [provider]  ?atropine 1 % ophthalmic solution Place 4 drops under the tongue every 4 (four) hours as needed (for increased secretions).   Yes [provider]  ?Baclofen 5 MG TABS Place 5 mg into feeding tube 2 (two) times daily.   Yes [provider]  ?cefdinir (OMNICEF) 300 MG capsule Place 300 mg into feeding tube 2 (two) times daily. 08/21/21 09/18/21 Yes [provider]  ?Acidophilus Lactobacillus CAPS Place 1 capsule into feeding tube in the morning.    [provider]  ?albuterol (VENTOLIN HFA) 108 (90 Base) MCG/ACT inhaler Inhale 2 puffs into the lungs 2 (two) times daily.    [provider]  ?Amino Acids-Protein Hydrolys (PRO-STAT AWC) LIQD Give 30 mLs by tube daily.    [provider]  ?docusate (COLACE) 50 MG/5ML liquid Place 100 mg into feeding tube 2 (two) times daily.    [provider]  ?DULoxetine HCl 20 MG CSDR 20 mg at bedtime. per G-Tube,    [provider]  ?Ferrous Sulfate 220 (44 Fe) MG/5ML LIQD Give 220 mg by tube 2 (two) times daily. Via G tube    [provider]  ?finasteride (PROSCAR) 5 MG tablet Take 1 tablet (5 mg total) by mouth daily. Per tube 03/11/21   Eugenie Filler, MD  ?gabapentin (NEURONTIN) 300 MG capsule Take 1 capsule (300 mg total) by mouth at bedtime. * do not crush* 07/05/21   Little Ishikawa, MD  ?guaiFENesin (ROBITUSSIN) 100 MG/5ML liquid Place 100 mg into feeding tube 2 (two) times daily.    [provider]  ?midodrine (PROAMATINE) 10 MG tablet Place 1 tablet (10 mg total) into feeding tube 3 (three) times daily. 11/18/20   Eugenie Filler, MD  ?nutrition supplement, JUVEN, Fanny Dance) PACK Place 1 packet into feeding tube 2 (two) times daily between meals. ?Patient taking differently: Place 1 packet into feeding tube See admin instructions. Mix the contents of 1 packet into  6 ounces of fluid and place into tube 2 times a day between meals 11/18/20   Eugenie Filler, MD  ?Nutritional Supplements (FEEDING SUPPLEMENT, OSMOLITE 1.5 CAL,) LIQD Place 1,000 mLs into feeding tube continuous. ?Patient taking differently: Place 75 mL/hr into feeding tube See admin instructions. 75 ml's/hr, via gastric tube, for 22 hours with a break of 2 hours for personal care 11/18/20   Eugenie Filler, MD  ?nystatin (MYCOSTATIN/NYSTOP)  powder Apply 1 application topically See admin instructions. Apply topically once a day to sacrum peri-wound and surrounding skin- during wound dressing change    [provider]  ?omeprazole (PRILOSEC) 20 MG capsule Take 1 capsule (20 mg total) by mouth 2 (two) times daily before a meal. 20 mg, via G-Tube, 2 times daily x 4 weeks, then daily. 03/10/21   Eugenie Filler, MD  ?polyethylene glycol (MIRALAX / GLYCOLAX) 17 g packet Place 17 g into feeding tube See admin instructions. Mix 17 grams of powder into 8 ounces of water and place into G-Tube once a day    [provider]  ?scopolamine (TRANSDERM-SCOP) 1 MG/3DAYS Place 1 patch onto the skin every 3 (three) days.    [provider]  ?tamsulosin (FLOMAX) 0.4 MG CAPS capsule Take 1 capsule (0.4 mg total) by mouth daily. ?Patient taking differently: 0.4 mg See admin instructions. 0.4 mg per G-Tube every morning 04/28/20   Newman Pies, MD  ?Water For Irrigation, Sterile (FREE WATER) SOLN Place 100 mLs into feeding tube every 4 (four) hours. 03/10/21   Eugenie Filler, MD  ?XTAMPZA ER 9 MG C12A Take 1 capsule by mouth 2 (two) times daily. 06/22/21   [provider]  ?zinc gluconate 50 MG tablet Place 50 mg into feeding tube daily.    [provider]  ? ? ? Allergies:  ? ?  ?Allergies  ?Allergen Reactions  ? Iodine Swelling and Other (See Comments)  ?  "NOT CT CONTRAST" and "Allergic," per MAR  ? Penicillins Itching, Swelling and Other (See Comments)  ?  "** tolerates  cephalosporins; facial swelling, itchy throat" ?"Allergic," per MAR  ? ? ? Physical Exam:  ? ?Vitals ? ?Blood pressure 123/66, pulse (!) 117, temperature (!) 101.4 ?F (38.6 ?C), temperature source Rectal, res

## 2021-09-11 NOTE — ED Notes (Signed)
Pt's sats 88% on 8LHFNC, breathing through mouth. Bumped up to 15LHFLNC. No apparent distress at this time. RT notified of increased oxygenation. Sats now >94% ?

## 2021-09-11 NOTE — Progress Notes (Signed)
Pharmacy Antibiotic Note ? ?Shannon [name] is a 76 y.o. male with his quadriplegia, sacral osteomyelitis, and stage IV sacral decubitus ulcer presented to the ED from [facility] on 09/19/2021 with AMS. Code sepsis activated in the ED. Pharmacy has been consulted to dose vancomycin and cefepime for sepsis. ? ?Plan: ?- vancomycin 1000 mg IV x1 given in the ED, then 750 mg q12h for est AUC 421 ?- cefepime 2 gm q8h ? ?_______________________________________ ? ?Height: 6' (182.9 cm) ?Weight: 79.4 kg (175 lb) ?IBW/kg (Calculated) : 77.6 ? ?Temp (24hrs), Avg:101.4 ?F (38.6 ?C), Min:101.4 ?F (38.6 ?C), Max:101.4 ?F (38.6 ?C) ? ?Recent Labs  ?Lab 08/25/2021 ?1532 09/02/2021 ?1734  ?WBC 12.2*  --   ?CREATININE 0.82  --   ?LATICACIDVEN 1.6 1.3  ?  ?Estimated Creatinine Clearance: 85.4 mL/min (by C-G formula based on SCr of 0.82 mg/dL).   ? ?Allergies  ?Allergen Reactions  ? Iodine Swelling and Other (See Comments)  ?  "NOT CT CONTRAST" ?"Allergic," per MAR  ? Penicillins Itching, Swelling and Other (See Comments)  ?  "** tolerates cephalosporins; facial swelling, itchy throat" ?"Allergic," per MAR  ? ? ?Thank you for allowing pharmacy to be a part of this patient?s care. ? Lynelle Doctor ?09/15/2021 6:23 PM ? ?

## 2021-09-11 NOTE — ED Notes (Addendum)
Pt's O2 sat noted to have dropped into mid-70s.  Pt seems to be sleeping and mouth breathing.  Non-rebreather at 6L [place] placed.  O2 Sat increased to high 90s.   ?

## 2021-09-11 NOTE — Progress Notes (Signed)
RT came in the room. Pts pulse ox was not properly working. Changed it from the finger to the ear and pt is a mouth breather. Pt placed on NRB and O2 saturation is 99%, RR 35, P 100. Pt is resting at this time.  ?

## 2021-09-11 NOTE — ED Notes (Signed)
Urine sample taken from previously established foley catheter.  Drainage bag previously emptied and new urine sent.  ?

## 2021-09-11 NOTE — Progress Notes (Signed)
RT called to evaluate PT for low Sp02. Placed PT on 8 LPM high flow nasal cannula system- current Sp02 9%. RN aware. ?

## 2021-09-11 NOTE — Progress Notes (Signed)
[employer] engineer, [employer] [facility]) Hospital Liaison Note ? ?This patient is a current hospice patient with [facility], admitted with a terminal diagnosis of abnormal weight loss.    ?  ?[facility] will continue to follow for any discharge planning needs and to coordinate continuation of hospice care.  ? ?Please don't hesitate to call with any Hospice related questions or concerns.  ?  ?Thank you for the opportunity to participate in this patient's care. ? ?Dionicio Stall, LCSW ?[facility] Liaison ?[phone] ?

## 2021-09-11 NOTE — Sepsis Progress Note (Signed)
eLink is following this Code Sepsis. °

## 2021-09-11 NOTE — Progress Notes (Signed)
A consult was received from an ED physician for vancomycin and cefepime per pharmacy dosing.  The patient's profile has been reviewed for ht/wt/allergies/indication/available labs.   ? ?Of note, pt has PCN listed as an allergy but tolerated cefepime in the past. ? ?A one time order has been placed for cefepime 2 gm and vancomycin 1gm x1.  Further antibiotics/pharmacy consults should be ordered by admitting physician if indicated.       ?                ?Thank you, ?Dorna Leitz P ?08/31/2021  3:47 PM ? ?

## 2021-09-11 NOTE — ED Notes (Signed)
Respiratory consulted for better O2 option.  Directed to increased NRB to 10L and RT will come evaluate in person.  ?

## 2021-09-12 ENCOUNTER — Telehealth: Payer: Self-pay

## 2021-09-12 DIAGNOSIS — R6521 Severe sepsis with septic shock: Secondary | ICD-10-CM

## 2021-09-12 DIAGNOSIS — N179 Acute kidney failure, unspecified: Secondary | ICD-10-CM

## 2021-09-12 DIAGNOSIS — A419 Sepsis, unspecified organism: Secondary | ICD-10-CM | POA: Diagnosis not present

## 2021-09-12 LAB — BLOOD CULTURE ID PANEL (REFLEXED) - BCID2

## 2021-09-12 LAB — CBC
HCT: 28.6 % — ABNORMAL LOW (ref 39.0–52.0)
Hemoglobin: 7 g/dL — ABNORMAL LOW (ref 13.0–17.0)
MCH: 22.9 pg — ABNORMAL LOW (ref 26.0–34.0)
MCHC: 24.5 g/dL — ABNORMAL LOW (ref 30.0–36.0)
MCV: 93.5 fL (ref 80.0–100.0)
Platelets: 280 10*3/uL (ref 150–400)
RBC: 3.06 MIL/uL — ABNORMAL LOW (ref 4.22–5.81)
RDW: 18.4 % — ABNORMAL HIGH (ref 11.5–15.5)
WBC: 26.5 10*3/uL — ABNORMAL HIGH (ref 4.0–10.5)
nRBC: 0.1 % (ref 0.0–0.2)

## 2021-09-12 LAB — COMPREHENSIVE METABOLIC PANEL
ALT: 19 U/L (ref 0–44)
AST: 42 U/L — ABNORMAL HIGH (ref 15–41)
Albumin: <1.5 g/dL — ABNORMAL LOW (ref 3.5–5.0)
Alkaline Phosphatase: 105 U/L (ref 38–126)
Anion gap: 1 — ABNORMAL LOW (ref 5–15)
BUN: 56 mg/dL — ABNORMAL HIGH (ref 8–23)
CO2: 33 mmol/L — ABNORMAL HIGH (ref 22–32)
Calcium: 9.1 mg/dL (ref 8.9–10.3)
Chloride: 106 mmol/L (ref 98–111)
Creatinine, Ser: 1.06 mg/dL (ref 0.61–1.24)
GFR, Estimated: >60 mL/min (ref >60)
Glucose, Bld: 126 mg/dL — ABNORMAL HIGH (ref 70–99)
Potassium: 5.9 mmol/L — ABNORMAL HIGH (ref 3.5–5.1)
Sodium: 140 mmol/L (ref 135–145)
Total Bilirubin: 0.1 mg/dL — ABNORMAL LOW (ref 0.3–1.2)
Total Protein: 10.5 g/dL — ABNORMAL HIGH (ref 6.5–8.1)

## 2021-09-12 LAB — MRSA NEXT GEN BY PCR, NASAL: MRSA by PCR Next Gen: DETECTED — AB

## 2021-09-12 MED ORDER — MORPHINE SULFATE (PF) 2 MG/ML IV SOLN
2.0000 mg | INTRAVENOUS | Status: DC | PRN
Start: 2021-09-12 — End: 2021-09-13

## 2021-09-12 MED ORDER — IPRATROPIUM-ALBUTEROL 0.5-2.5 (3) MG/3ML IN SOLN
3.0000 mL | Freq: Four times a day (QID) | RESPIRATORY_TRACT | Status: DC
  Administered 2021-09-12 (×2): 3 mL via RESPIRATORY_TRACT
  Filled 2021-09-12: qty 3

## 2021-09-12 MED ORDER — BIOTENE DRY MOUTH MT LIQD: 15.0000 mL | OROMUCOSAL | Status: DC | PRN

## 2021-09-12 MED ORDER — HALOPERIDOL LACTATE 2 MG/ML PO CONC
0.5000 mg | ORAL | Status: DC | PRN
  Filled 2021-09-12: qty 0.3

## 2021-09-12 MED ORDER — MORPHINE 100MG IN NS 100ML (1MG/ML) PREMIX INFUSION
5.0000 mg/h | INTRAVENOUS | Status: DC
  Administered 2021-09-12: 5 mg/h via INTRAVENOUS
  Filled 2021-09-12: qty 100

## 2021-09-12 MED ORDER — MORPHINE SULFATE (PF) 2 MG/ML IV SOLN
INTRAVENOUS | Status: AC
  Administered 2021-09-12: 2 mg via INTRAVENOUS
  Filled 2021-09-12: qty 1

## 2021-09-12 MED ORDER — HALOPERIDOL LACTATE 5 MG/ML IJ SOLN: 0.5000 mg | INTRAMUSCULAR | Status: DC | PRN

## 2021-09-12 MED ORDER — HALOPERIDOL 0.5 MG PO TABS
0.5000 mg | ORAL_TABLET | ORAL | Status: DC | PRN
  Filled 2021-09-12: qty 1

## 2021-09-14 LAB — URINE CULTURE: Culture: >=100000 — AB

## 2021-09-14 LAB — CULTURE, BLOOD (ROUTINE X 2): Special Requests: ADEQUATE

## 2021-09-16 LAB — CULTURE, BLOOD (ROUTINE X 2)
Culture: NO GROWTH
Special Requests: ADEQUATE

## 2021-09-22 NOTE — Progress Notes (Signed)
TOD 1645. Absent heart beat on ausculation. No pulse, no respirations. 2nd RN Garwin Brothers. MD notified ?

## 2021-09-22 NOTE — Progress Notes (Signed)
Pt  family was called, vital signs unstable, agonal breathing, MD at bedside. Comfort care initiated. ?

## 2021-09-22 NOTE — Progress Notes (Signed)
Attempted to call daughter Shannon [name] with an update on patient and to clarify code status. Voice message left to call for update. ?

## 2021-09-22 NOTE — Death Summary Note (Signed)
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 started  Patient ceased to breathe 1645, no chest rise no pupillary reactivity and no heartbeat-time of death 5 Family notified and aware-copy of death summary forwarded to PCP       The results of significant diagnostics from this hospitalization (including imaging, microbiology, ancillary and laboratory) are listed below for reference.   Significant Diagnostic Studies: DG Chest Port 1 View  Result Date: 09-23-21 CLINICAL DATA:  Altered mental status, labored breathing EXAM: PORTABLE CHEST 1 VIEW COMPARISON:  07/03/2021 FINDINGS: Stable heart size. Low lung volumes. No focal airspace consolidation, pleural effusion, or pneumothorax. Degenerative changes of both shoulders. IMPRESSION: Low lung volumes without acute  cardiopulmonary findings. Electronically Signed   By: Duanne Guess D.O.   On: Sep 23, 2021 15:58    Microbiology: Recent Results (from the past 240 hour(s))  Blood Culture (routine x 2)     Status: None (Preliminary result)   Collection Time: 23-Sep-2021  3:29 PM   Specimen: BLOOD  Result Value Ref Range Status   Specimen Description   Final    BLOOD BLOOD LEFT HAND Performed at [facility], [address] [place], [place] [address]    Special Requests   Final    BOTTLES DRAWN AEROBIC AND ANAEROBIC Blood Culture adequate volume Performed at [facility], [address], [place], [place] [address]    Culture   Final    NO GROWTH < 12 HOURS Performed at [facility], [address], [place], [place] [address]    Report Status PENDING  Incomplete  Blood Culture (routine x 2)     Status: None (Preliminary result)   Collection Time: 23-Sep-2021  3:30 PM   Specimen: BLOOD  Result Value Ref Range Status   Specimen Description   Final    BLOOD BLOOD RIGHT HAND Performed at [facility], [address] [place], [place] [address]    Special Requests   Final    BOTTLES DRAWN AEROBIC AND ANAEROBIC Blood Culture adequate volume Performed at [facility], [address], [place], [place] [address]    Culture  Setup Time   Final    GRAM POSITIVE COCCI ANAEROBIC BOTTLE ONLY CRITICAL RESULT CALLED TO, READ BACK BY AND VERIFIED WITH: PHARMD A. PHOM 08/26/2021@1428  FH Performed at [facility], [address], [place], [place] [address]  Culture PENDING  Incomplete   Report Status PENDING  Incomplete  Blood Culture ID Panel (Reflexed)     Status: Abnormal   Collection Time: 09/11/21  3:30 PM  Result Value Ref Range Status   Enterococcus faecalis NOT DETECTED NOT DETECTED Final   Enterococcus Faecium NOT DETECTED NOT DETECTED Final   Listeria monocytogenes NOT DETECTED NOT DETECTED Final   Staphylococcus  species DETECTED (A) NOT DETECTED Final    Comment: CRITICAL RESULT CALLED TO, READ BACK BY AND VERIFIED WITH: PHARMD A. PHOM 06-06-22@1428  FH    Staphylococcus aureus (BCID) NOT DETECTED NOT DETECTED Final   Staphylococcus epidermidis DETECTED (A) NOT DETECTED Final    Comment: Methicillin (oxacillin) resistant coagulase negative staphylococcus. Possible blood culture contaminant (unless isolated from more than one blood culture draw or clinical case suggests pathogenicity). No antibiotic treatment is indicated for blood  culture contaminants. CRITICAL RESULT CALLED TO, READ BACK BY AND VERIFIED WITH: PHARMD A. PHOM 06-06-22@1428  FH    Staphylococcus lugdunensis NOT DETECTED NOT DETECTED Final   Streptococcus species NOT DETECTED NOT DETECTED Final   Streptococcus agalactiae NOT DETECTED NOT DETECTED Final   Streptococcus pneumoniae NOT DETECTED NOT DETECTED Final   Streptococcus pyogenes NOT DETECTED NOT DETECTED Final   A.calcoaceticus-baumannii NOT DETECTED NOT DETECTED Final   Bacteroides fragilis NOT DETECTED NOT DETECTED Final   Enterobacterales NOT DETECTED NOT DETECTED Final   Enterobacter cloacae complex NOT DETECTED NOT DETECTED Final   Escherichia coli NOT DETECTED NOT DETECTED Final   Klebsiella aerogenes NOT DETECTED NOT DETECTED Final   Klebsiella oxytoca NOT DETECTED NOT DETECTED Final   Klebsiella pneumoniae NOT DETECTED NOT DETECTED Final   Proteus species NOT DETECTED NOT DETECTED Final   Salmonella species NOT DETECTED NOT DETECTED Final   Serratia marcescens NOT DETECTED NOT DETECTED Final   Haemophilus influenzae NOT DETECTED NOT DETECTED Final   Neisseria meningitidis NOT DETECTED NOT DETECTED Final   Pseudomonas aeruginosa NOT DETECTED NOT DETECTED Final   Stenotrophomonas maltophilia NOT DETECTED NOT DETECTED Final   Candida albicans NOT DETECTED NOT DETECTED Final   Candida auris NOT DETECTED NOT DETECTED Final   Candida glabrata NOT DETECTED NOT DETECTED Final    Candida krusei NOT DETECTED NOT DETECTED Final   Candida parapsilosis NOT DETECTED NOT DETECTED Final   Candida tropicalis NOT DETECTED NOT DETECTED Final   Cryptococcus neoformans/gattii NOT DETECTED NOT DETECTED Final   Methicillin resistance mecA/C DETECTED (A) NOT DETECTED Final    Comment: CRITICAL RESULT CALLED TO, READ BACK BY AND VERIFIED WITH: PHARMD A. PHOM 06-06-22@1428  FH Performed at [facility], [address] [place], [place] [address]   Resp Panel by RT-PCR (Flu A&B, Covid) Nasopharyngeal Swab     Status: None   Collection Time: 09/11/21  3:32 PM   Specimen: Nasopharyngeal Swab; Nasopharyngeal(NP) swabs in vial transport medium  Result Value Ref Range Status   SARS Coronavirus 2 by RT PCR NEGATIVE NEGATIVE Final    Comment: (NOTE) SARS-CoV-2 target nucleic acids are NOT DETECTED.  The SARS-CoV-2 RNA is generally detectable in upper respiratory specimens during the acute phase of infection. The lowest concentration of SARS-CoV-2 viral copies this assay can detect is 138 copies/mL. A negative result does not preclude SARS-Cov-2 infection and should not be used as the sole basis for treatment or other patient management decisions. A negative result may occur with  improper specimen collection/handling, submission of specimen other than nasopharyngeal swab, presence of viral mutation(s) within the areas targeted by this assay,  and inadequate number of viral copies(<138 copies/mL). A negative result must be combined with clinical observations, patient history, and epidemiological information. The expected result is Negative.  Fact Sheet for Patients:  BloggerCourse.com  Fact Sheet for Healthcare Providers:  SeriousBroker.it  This test is no t yet approved or cleared by the [place] FDA and  has been authorized for detection and/or diagnosis of SARS-CoV-2 by FDA under an Emergency Use Authorization (EUA).  This EUA will remain  in effect (meaning this test can be used) for the duration of the COVID-19 declaration under Section 564(b)(1) of the Act, 21 U.S.C.section 360bbb-3(b)(1), unless the authorization is terminated  or revoked sooner.       Influenza A by PCR NEGATIVE NEGATIVE Final   Influenza B by PCR NEGATIVE NEGATIVE Final    Comment: (NOTE) The Xpert Xpress SARS-CoV-2/FLU/RSV plus assay is intended as an aid in the diagnosis of influenza from Nasopharyngeal swab specimens and should not be used as a sole basis for treatment. Nasal washings and aspirates are unacceptable for Xpert Xpress SARS-CoV-2/FLU/RSV testing.  Fact Sheet for Patients: BloggerCourse.com  Fact Sheet for Healthcare Providers: SeriousBroker.it  This test is not yet approved or cleared by the [place] FDA and has been authorized for detection and/or diagnosis of SARS-CoV-2 by FDA under an Emergency Use Authorization (EUA). This EUA will remain in effect (meaning this test can be used) for the duration of the COVID-19 declaration under Section 564(b)(1) of the Act, 21 U.S.C. section 360bbb-3(b)(1), unless the authorization is terminated or revoked.  Performed at [facility], [address] [place], [place] [address]   MRSA Next Gen by PCR, Nasal     Status: Abnormal   Collection Time: 09/17/2021  3:51 AM   Specimen: Nasal Mucosa; Nasal Swab  Result Value Ref Range Status   MRSA by PCR Next Gen DETECTED (A) NOT DETECTED Final    Comment: (NOTE) The GeneXpert MRSA Assay (FDA approved for NASAL specimens only), is one component of a comprehensive MRSA colonization surveillance program. It is not intended to diagnose MRSA infection nor to guide or monitor treatment for MRSA infections. Test performance is not FDA approved in patients less than 20 years old. Performed at [facility], [address], [place], [place] [address]     Time spent: 23 minutes  Signed: Rhetta Mura, MD 09/15/2021

## 2021-09-22 NOTE — Progress Notes (Signed)
Morphine gtt d/c and wasted with Metallurgist. 7ml morphine wasted with 2 RN's ?Lanayah Gartley A ? ?

## 2021-09-22 NOTE — Progress Notes (Signed)
PHARMACY - PHYSICIAN COMMUNICATION ?CRITICAL VALUE ALERT - BLOOD CULTURE IDENTIFICATION (BCID) ? ?Shannon [name] is an 76 y.o. male with quadriplegia, sacral osteomyelitis, and stage IV sacral decubitus ulcer presented to the ED from [facility] on 08/31/2021 with AMS. Code sepsis activated in the ED and he was started on vancomycin and cefepime for sepsis.  Patient's now comfort care with antibiotics d/ced this morning. Micro lab reported that 1 of 4 blood culture bottles collected on 08/26/2021 has GPC (BCID= staph epi, meAC+). ? ?Name of physician (or Provider) Contacted: Dr. Verlon Au ? ?Current antibiotics: none ? ?Changes to prescribed antibiotics recommended:  ?- Plan is comfort care for pt, off abx. Continue current course. ? ?Results for orders placed or performed during the hospital encounter of 09/13/2021  ?Blood Culture ID Panel (Reflexed) (Collected: 09/06/2021  3:30 PM)  ?Result Value Ref Range  ? Enterococcus faecalis NOT DETECTED NOT DETECTED  ? Enterococcus Faecium NOT DETECTED NOT DETECTED  ? Listeria monocytogenes NOT DETECTED NOT DETECTED  ? Staphylococcus species DETECTED (A) NOT DETECTED  ? Staphylococcus aureus (BCID) NOT DETECTED NOT DETECTED  ? Staphylococcus epidermidis DETECTED (A) NOT DETECTED  ? Staphylococcus lugdunensis NOT DETECTED NOT DETECTED  ? Streptococcus species NOT DETECTED NOT DETECTED  ? Streptococcus agalactiae NOT DETECTED NOT DETECTED  ? Streptococcus pneumoniae NOT DETECTED NOT DETECTED  ? Streptococcus pyogenes NOT DETECTED NOT DETECTED  ? A.calcoaceticus-baumannii NOT DETECTED NOT DETECTED  ? Bacteroides fragilis NOT DETECTED NOT DETECTED  ? Enterobacterales NOT DETECTED NOT DETECTED  ? Enterobacter cloacae complex NOT DETECTED NOT DETECTED  ? Escherichia coli NOT DETECTED NOT DETECTED  ? Klebsiella aerogenes NOT DETECTED NOT DETECTED  ? Klebsiella oxytoca NOT DETECTED NOT DETECTED  ? Klebsiella pneumoniae NOT DETECTED NOT DETECTED  ? Proteus species NOT DETECTED NOT  DETECTED  ? Salmonella species NOT DETECTED NOT DETECTED  ? Serratia marcescens NOT DETECTED NOT DETECTED  ? Haemophilus influenzae NOT DETECTED NOT DETECTED  ? Neisseria meningitidis NOT DETECTED NOT DETECTED  ? Pseudomonas aeruginosa NOT DETECTED NOT DETECTED  ? Stenotrophomonas maltophilia NOT DETECTED NOT DETECTED  ? Candida albicans NOT DETECTED NOT DETECTED  ? Candida auris NOT DETECTED NOT DETECTED  ? Candida glabrata NOT DETECTED NOT DETECTED  ? Candida krusei NOT DETECTED NOT DETECTED  ? Candida parapsilosis NOT DETECTED NOT DETECTED  ? Candida tropicalis NOT DETECTED NOT DETECTED  ? Cryptococcus neoformans/gattii NOT DETECTED NOT DETECTED  ? Methicillin resistance mecA/C DETECTED (A) NOT DETECTED  ? ? ?Lynelle Doctor ?28-Sep-2021  4:33 PM ? ?

## 2021-09-22 NOTE — Progress Notes (Signed)
Patient actively dying-RR~ 50, hypotensive with low map, GCS 4 ?D/w family to come and visit as likely will pass in next several hours ?Comfort care orders/focused order set. ? ?Pleas Koch, MD ?[facility] ?10:15 AM ? ? ? ? ? ?

## 2021-09-22 NOTE — Telephone Encounter (Signed)
FYI  Please see below

## 2021-09-22 NOTE — Progress Notes (Signed)
?   08/23/2021 2200  ?Pressure Injury 07/03/21 Tibial Left;Posterior;Anterior Unstageable - Full thickness tissue loss in which the base of the injury is covered by slough (yellow, tan, gray, green or brown) and/or eschar (tan, brown or black) in the wound bed. full thickness  ?Date First Assessed/Time First Assessed: 07/03/21 2350   Location: Tibial  Location Orientation: Left;Posterior;Anterior  Staging: Unstageable - Full thickness tissue loss in which the base of the injury is covered by slough (yellow, tan, gray, green ...  ?Dressing Type Gauze (Comment) ?(kerlex over 4x4)  ?Dressing Old drainage (marked)  ?Site / Wound Assessment Dry;Red ?(scattered)  ?Wound Length (cm) 20 cm  ?Wound Width (cm) 3 cm  ?Wound Surface Area (cm^2) 60 cm^2  ?Drainage Amount None  ?Treatment Cleansed;Packing (Impregnated strip) ?(wrapped with kerlex)  ? ?Present on admission ?

## 2021-09-22 NOTE — Telephone Encounter (Signed)
FYI ? ?[facility] hospice liaison called to let Dr. Milinda Cave a hospice patient has been admitted to [facility] today and he is room number 1431.  ? ?An [facility] representative can be reached for further questions at [phone]. ?

## 2021-09-22 NOTE — Consult Note (Signed)
WOC consult has been removed per staff, patient has been transitioned to comfort care. ?Crosby Oriordan [facility], CNS, CWON-AP ?[phone]  ?

## 2021-09-22 NOTE — Telephone Encounter (Signed)
Noted  

## 2021-09-22 NOTE — Progress Notes (Signed)
Chaplain responded to request from RN to be here for family support.  Daughter had been notified of the death and said she was coming.  RN attempted to call back and finally got the daughter who changed her mind and is not coming.  She provided funeral home information for the patient.  Chaplain offered staff support. ?Chaplain Agustin Cree, [place] ? ? ? 09/04/2021 1840  ?Clinical Encounter Type  ?Visited With Health care provider  ?Visit Type Death  ?Referral From Nurse  ?Consult/Referral To Chaplain  ? ? ?

## 2021-09-22 NOTE — Progress Notes (Signed)
Chaplain offered prayer over Shannon [name].  Family had already left.  ? ? ? October 12, 2021 1300  ?Clinical Encounter Type  ?Visited With Patient;Family  ?Visit Type Initial;Spiritual support  ? ? ?

## 2021-09-22 NOTE — Plan of Care (Signed)
  Problem: Fluid Volume: Goal: Hemodynamic stability will improve Outcome: Progressing   Problem: Respiratory: Goal: Ability to maintain adequate ventilation will improve Outcome: Progressing   

## 2021-09-22 NOTE — Progress Notes (Addendum)
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  ?Mr. Arb is a current hospice patient with a terminal diagnosis of Protein Calorie Malnutrition, who went to the hospital with AMS and increased labored breaths. Patient resides at [facility] where EMS was activated by facility to have patient evaluated at the hospital. [facility] was notified after patient transport to hospital. Patient is admitted with sepsis.This is a related admission per Dr. Eulas Post of [facility]. ? ?Patient is inpatient appropriate due to the level of skilled care needed to ongoing assessment and management of sepsis, increased oxygen needs  and IV medications. ? ?Bedside RN was in staff huddle during visit so unable to receive update on patient current status. Family arrived during patient visit - [name] was extremely distraught in tears receiving support from family member Robin. Left Family at bedside with patient - encouraged them to call [facility] for support or hospice/EOL issues as needed. Did not engage with patient as family requested private time with patient who was nonresponsive in bed on NRB mask with labored breaths.  ?  ?V/S:  98.7, 100, 35,106/53,9(5% on 15 L O2 via NRM ?I&O: 248/400 (-152) ?Abnormal lab work: WBC 26.5, RBC 3.06, Hemo 7.0, HCT 28.6, K 5.9, CO2 33, BUN 56, Glucose 126,  ?Diagnostics:  Chest x-ray (3/21) Low lung volumes without acute Cardiopulmonary findings- Pleural effusion ?IVs/PRNs: Duoneb 59ml adm at 3/22 at 0959, Scop patch applied 1.5 mg at 3/22 at 0313, LR IVF at 10 ml/hr , Morphine 2 mg IV at 0939 on 3/22 ?  ?Problem list:   ?Per MD notes - patient is actively dying ?Sepsis-patient presented with sepsis, likely source due to urine versus multiple decubitus ulcers.  Patient has been started on vancomycin and cefepime.  Due to aggressive IV fluid hydration, patient has developed secretions bilaterally.  Blood pressure is stable, will change IV fluids to KVO to prevent worsening secretions as patient  is unable to clear airway due to altered mental status.  Will order scopolamine patch.  Follow urine and blood culture results. ?Multiple decubitus ulcers-patient has foul-smelling ulcer in the posterior scalp, and also stage IV decubitus ulcer on sacrum.  Will obtain wound care consultation in a.m. ?Rest of home medications are currently on hold as patient is n.p.o. due to altered mental status. ?Goals of care-discussed with patient's daughter, patient is currently enrolled in hospice and is DNR/DNI.  Patient presented with sepsis and altered mental status going on for past 3 days.  At this time patient mental status has significantly deteriorated and not even responding to painful stimuli.  He is developing rattling sound lungs due to retained secretions.  Discussed with patient's daughter that patient is deteriorating and may not survive current hospitalization.  She understands patient's poor prognosis but wants to give him a fighting chance.  We will continue with antibiotics, I will keep IV fluids only KVO to prevent worsening secretions.  Scopolamine patch will be ordered.  I will also obtain palliative care consultation for possible end-of-life care.  Patient has extremely poor prognosis and life expectancy is hours to days. ?  ?D/C planning- ongoing - [facility] will continue to support patient and family during hospitalization - patient with poor prognosis may be a hospital death at this time.  ? ?Goals of Care: Clear - DNR with comfort care at hospital with support of hospice as needed. ?Communication with IDT- Updated Media team. PCP notified of hospitalization. Transfer summary/Medication list on chart ?Communication with PCG- Family member Shirlean Mylar supported  at hospital - [name] at patient bedside at time of visit ?  ?Please call with any hospice related questions/concerns, ?  ?Gar Ponto, RN ?[facility] (in [place]) ?[phone] ?

## 2021-09-22 DEATH — deceased

## 2022-02-07 ENCOUNTER — Ambulatory Visit: Payer: Medicare Other | Attending: Otolaryngology | Admitting: Otolaryngology

## 2022-02-07 ENCOUNTER — Ambulatory Visit: Payer: Medicare Other | Attending: Speech-Language Pathologist | Admitting: Speech-Language Pathologist

## 2022-02-07 VITALS — BP 138/84 | HR 68 | Temp 97.5°F | Ht 74.0 in | Wt 240.0 lb

## 2022-02-07 DIAGNOSIS — R490 Dysphonia: Secondary | ICD-10-CM | POA: Insufficient documentation

## 2022-02-07 DIAGNOSIS — J3801 Paralysis of vocal cords and larynx, unilateral: Secondary | ICD-10-CM | POA: Insufficient documentation

## 2022-02-07 DIAGNOSIS — I69991 Dysphagia following unspecified cerebrovascular disease: Secondary | ICD-10-CM

## 2022-02-07 DIAGNOSIS — R1312 Dysphagia, oropharyngeal phase: Secondary | ICD-10-CM | POA: Insufficient documentation

## 2022-02-07 NOTE — Progress Notes (Signed)
SPEECH LANGUAGE PATHOLOGY/LARYNGOLOGY EVALUATION   AND INITIAL PLAN OF CARE     The following patient identifiers were confirmed today: name and date of birth.    Visits from start of care: 1  Referring Provider: Dr. 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 He is here for annual follow-up with no evidence of new lesions and fairly stable examination characterized by bilateral true vocal fold bowing, spindle glottic closure, and compensatory mediolateral phonatory hyperfunction. Glottic closure improved with cues for breath support and increased volume. He does note liquid dysphagia when he is not paying attention however patient responded well to use of chin tuck on clinical swallow today. Overall, patient reported he is doing well and is meeting his voice and swallowing needs without weight loss or recent pneumonia. As such, MD recommended that patient RTC on a PRN basis however he was encouraged to follow-up should he notice his voice or swallowing worsen. He will use chin tuck with liquids and was educated in importance of oral hygiene and s/sx of aspiration pneumonia to good effect.       PERTINENT MEDICAL/SURGICAL HISTORY:   No past medical history on file.    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 He does carry a  h/o NSCLC for which he has undergone primary LLL lobectomy. He continues to follow up with thoracic surgery.    Prior Level of Function: Before the onset of the current condition, the patient was able to talk and swallow without difficulty     SUBJECTIVE:   Pt. was alert and ready to participate     Patient Statement: Patient reported that his voice remains stable, however his wife noted that he is hard to understand sometimes because he "speaks too fast and mumbles." He reported he still has some liquid dysphagia. He will cough or choke on liquids ~once per week "if I am not paying attention." He has not had recent pneumonia. He reported he has gained weight recently. He reported his main goal for today's visit is annual follow-up/clearance.    Barriers to learning: none  Preferred Learning style: demonstration and verbal  Cultural Practices that Influence Care: none    Pain Rating/Action Taken/Fall report:  See Patient Self Assessment of Pain and falls in medical record in OTO section from today.    Voice Usage: Routine      OBJECTIVE:    Observations:   Resonance: WNL  Focus: posterior tone focus       Respiration: Poor coordination of phonation with respiration    Articulation: WNL    Other: none     Maximum phonation time -  Perceptual Rating Of Vocal Quality:  CAPE-V  The following parameters of voice quality will be rated upon completion of the following tasks:  1. Sustained vowels 2. Sentence production 3. Spontaneous speech    Clinician's Perceptual Assessment using Consensus Auditory Perceptual Evaluation of Voice (CAPE-V)    Overall Severity  38/100  Constant  Roughness   38/100  Constant   Breathiness   30/100  Intermittent  Strain    35/100  Constant  Pitch    24/100  Constant  Nature of abnormality: decreased variation   Loudness   30/100  Constant  Nature of abnormality:      Clinical Swallow Evaluation:  Oral Mechanism Examination: Not evaluated    Clinical Observations:  Cough with liquids  resolved with use of chin tuck    Modified Functional Oral Intake Scale:  7 - Total oral intake with no restrictions     Videostroboscopic Findings:    Patient was then seen with: Dr. Freddi Starr, laryngologist, who performed flexible videostroboscopy which revealed the following:    MUCOSA: No lesions but bilateral true vocal fold bowing    VOCAL FOLD MOVEMENT: bilateral ab/ad movement appreciated but mild asymmetry    GLOTTIC CLOSURE: Incomplete: mild, moderate spindle    VIBRATION:   Periodicity: periodic     Phase: symmetrical  Mucosal Wave:  Right: present  Left: present   Amplitude:  Right: normal   Left: normal     MUSCLE TENSION PATTERN: Lateral (Type II): bilateral; moderate    Supraglottis: WNL  Subglottis: Not visualized   Tremor: No  Breaks: No  Observations with Pitch Change: Vocal folds lengthen appropriately with increased pitch.  Observations with Changes in Loudness: -    Observations with Treatment Probes: No probes administered     Velopharyngeal Competency: Complete/Within Functional Limits (WFL)  Pharyngeal Squeeze: Complete/Within Functional Limits (WFL)  Murray Secretion Scale: Normal -0  Anatomic comments: -    TREATMENT AND EDUCATION:    Education:  Plan/recommendations were reviewed with patient/caregiver.  The following were: Taught to patient/caregiver; voice    Contact information was provided for further questions.     Response: patient/caregiver verbalized understanding    Skilled Services Provided: Beh. & qualitative analysis of voice & resonance    ASSESSMENT:  See clinical impression above.     Rehabilitation potential is: Good  Potential Barriers to achieve rehab goals: none  These goals were discussed and the patient agrees with them.    DISCHARGE GOALS:  1. Maintain best quality voice for functional communication and participation in community activities:   1a. Evaluate status of vocal folds     Goals to be met by today.     Patient/Family Input to Goals: Pt. wishes to: know status of  vocal folds    PLAN OF CARE:   Frequency/Duration of Treatment: Evaluation only     This patient will recheck with the referring provider per MD recs PRN    The plan outlined above was formulated, reviewed, and agreed upon with the patient and/or family/caregiver.  Patient/caregiver verbalized understanding and restated information in plan.    Sharyl Nimrod, [place], [place]  Speech-Language Pathologist  Department of Otolaryngology  [facility]  Voicemail: [phone]  Pager: [phone]    Referring Physician/Practitioner Certification Statement:     I Certify the need for these services furnished under this plan of treatment and while under my care.

## 2022-02-07 NOTE — Progress Notes (Signed)
Subjective:     Joel Parker is a 76 year old male here for ongoing care of dysphonia. 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 He was last seen a year ago and denies any changes in his health. Recent fluctuations in voice but overall stable. He denies cough, dysphagia, dyspnea, episodes of pneumonia, unintentional weight loss. He notes very rare episodes of coughing with water but this is not increased from prior.      Objective:      General: A well developed, overweight  male, in no acute distress.   Voice:Mildly rough voice.  Nose:  Nares patent, external nose without deformity.   Oral Cavity: Tongue mobile and midline, palate raises symmetrically, tonsils symmetric.  Neck:  Neck without LAD, thyroid non-palpable, laryngotracheal structures midline.   Chest:  Normal chest rise bilaterally,Non labored breathing, no stridor        Procedures(s):       PROCEDURE: Flexible Laryngoscopy with Stroboscopy   Indication(s):   Dysphonia   Attending:  Corliss Parish, MD     Findings:   Nasopharynx: Clear   Velopharyngeal closure:  Complete  Pharynx: Small amount of secretions pooling in base of tongue  Subglottis: clear  Compression/ hyperfunction: Mild left greater than right false fold compression  Motion: True vocal folds were bilaterally mobile no supraglottic lesions  On Stroboscopy  Mucosa: Bilateral thinning   Mucosal Wave: Asymmetric  Glottic Closure: Incomplete with small anterior gap   Description of Procedure:    The procedure was explained to the patient, who gave verbal consent. Viscous Lidocaine were used topically for anaesthesia.  The endoscope was passed atraumatically with the findings as described above. The scope was gently withdrawn.  The patient tolerated the procedure well, without complications.  The findings were reviewed with the patient, whose questions were answered.     Assessment and Plan:      Joel Parker is a 76 year old  with history of left vocal cord paresis secondary to stroke and laryngeal surface of epiglottis papilloma who has been doing quite well overall. He is stable with respect to his voice symptoms. We spoke about the option of an annual check up or RTC prn, each of which has merit. He elected prn and will certainly call if things change.

## 2022-07-16 ENCOUNTER — Telehealth (HOSPITAL_BASED_OUTPATIENT_CLINIC_OR_DEPARTMENT_OTHER): Payer: Self-pay | Admitting: Otolaryngology

## 2022-07-16 NOTE — Telephone Encounter (Signed)
LVM and advised Dr Arbutus Ped template has been updated started January 13 2023 and we are needing to reschedule their appointment 02/06/23. Rescheduled appointment to 02/03/23 at 11:40am. Letter also sent via mail.

## 2022-08-29 ENCOUNTER — Emergency Department: Payer: Self-pay

## 2022-08-29 ENCOUNTER — Inpatient Hospital Stay: Payer: Self-pay

## 2022-10-24 NOTE — Telephone Encounter (Addendum)
 The refill request falls outside of the [facility] medication refill policy parameters.  The refill request is being sent to the provider to address the medication refill further.      Date of last appt with provider being sent refill request:      Last encounter this department:   Visit date not found  Last encounter this department video:  Visit date not found  Last encounter this department audio:  Visit date not found  Last encounter with this provider:  02/12/2022 Ricardo MARLA Sergeant, MD  Last initial consult with this provider:  Visit date not found      Next scheduled appt with Provider:  02/13/2023    Future Appointments   Date Time Provider Department Center   10/30/2022  9:30 AM [facility] MRI ROOM 01 [facility]   02/13/2023 11:00 AM Ricardo MARLA Sergeant, MD [id number]       Last refill of medication: 08/29/2022      Kelley Ceo, MA  10/24/2022  4:09 PM      Requested Prescriptions   Pending Prescriptions Disp Refills   . ezetimibe (ZETIA) 10 mg tablet 90 tablet 3     Sig: Take 1 tablet (10 mg total) by mouth daily every night.       Intestinal Cholesterol Absorption Inhibitors Protocol Passed - 10/24/2022  9:28 AM        Passed - Normal total bilirubin in the past 12 months     Lab Results   Component Value Date    BILITOT 0.6 09/09/2022    BILITOT 0.6 08/30/2022             Passed - Lipid panel in past year     Lab Results   Component Value Date    CHOL 139 04/09/2012    CHOL 163 04/08/2012    CHLPL 148 09/09/2022     Lab Results   Component Value Date    HDL 84 09/09/2022    HDLC 58 04/09/2012    HDLC 59 04/08/2012     Lab Results   Component Value Date    LDL 70 04/09/2012    LDL 87 04/08/2012    LDLCHLNIH 52 09/09/2022     Lab Results   Component Value Date    TRIG 56 09/09/2022    TRIG 57 04/09/2012                   Passed - Normal AST in past 12 months     Lab Results   Component Value Date    AST 23 09/09/2022    AST 19 08/30/2022    AST 22 08/13/2022             Passed - Normal ALT in past 12  months     Lab Results   Component Value Date    ALT 15 09/09/2022    ALT 16 08/30/2022    ALT 14 08/13/2022             Passed - Medication not refilled in past 45 days (1.5 months)     No matching medication orders between 09/09/2022  4:09 PM and 10/24/2022  4:09 PM          Passed - Recent or future visit with authorizing provider     Future Appointments   Date Time Provider Department Center   10/30/2022  9:30 AM [facility] MRI ROOM 01 [facility]   02/13/2023 11:00 AM Ricardo MARLA Sergeant,  MD [id number] [facility]     Last encounter this department:   Visit date not found  Last encounter this department video:  Visit date not found  Last encounter this department audio:  Visit date not found  Last encounter with this provider:  02/12/2022 Ricardo MARLA Sergeant, MD  Last initial consult with this provider:  Visit date not found                                                     The following medication request was received from a telephone call from the patient, and has been entered into EMR and routed to the pool to run through the medication refill protocol.    Eulice Ernst, RN  10/24/2022  9:27 AM

## 2023-02-03 ENCOUNTER — Ambulatory Visit: Payer: Medicare Other | Attending: Otolaryngology | Admitting: Speech-Language Pathologist

## 2023-02-03 ENCOUNTER — Ambulatory Visit: Payer: Medicare Other | Attending: Unknown Physician Specialty | Admitting: Otolaryngology

## 2023-02-03 VITALS — BP 148/94 | HR 77 | Temp 97.7°F

## 2023-02-03 DIAGNOSIS — J3801 Paralysis of vocal cords and larynx, unilateral: Secondary | ICD-10-CM | POA: Insufficient documentation

## 2023-02-03 DIAGNOSIS — D369 Benign neoplasm, unspecified site: Secondary | ICD-10-CM | POA: Insufficient documentation

## 2023-02-03 DIAGNOSIS — R490 Dysphonia: Secondary | ICD-10-CM | POA: Insufficient documentation

## 2023-02-03 DIAGNOSIS — R1312 Dysphagia, oropharyngeal phase: Secondary | ICD-10-CM | POA: Insufficient documentation

## 2023-02-03 NOTE — Progress Notes (Signed)
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 Today, the Joel Parker presents with mild to moderately strained/rough/breathy hypophonia with posterior tone focus, poor respiratory-phonatory coordination, and mildly increased volume. Today's laryngeal videostroboscopy revealed moderate bilateral TVF bowing, bilaterally mobile TVFs with decreased L TVF mobility, incomplete glottic closure with mild to moderate spindle shaped gap, increased amplitude bilaterally, and mild to moderate mediolateral phonatory hyperfunction. Overall, the Joel Parker reports no functional voice or swallow difficulties at this time in the setting of relatively stable exam compared to most recent videostroboscopy on 02/07/2022. At this time, the medical team encouraged the Joel Parker to reach out should he notice bothersome voice, breathing, or swallowing symptoms in the future. Partner communication strategies, pulmonary hygiene recommendations, and previously discussed safer swallow strategies were reviewed today. The Joel Parker verbally expressed understanding and agreement with the results of today's evaluation and plan of care.       PERTINENT MEDICAL/SURGICAL HISTORY:   No past medical history on file.    Past  Surgical History:   Procedure Laterality Date    TYMPANOSTOMY GENERAL ANESTHESIA         HISTORY:  The Joel Parker is a 77 year old male known to this clinic for management of L TVF paresis 2/2 stroke as well as epiglottic papilloma, for which he has undergone Restylane injection and CO2 laser excision in 12/2012. He does carry a h/o NSCLC for which he has undergone primary LLL lobectomy. He continues to follow up with thoracic surgery.     Prior Level of Function: Before the onset of the current condition, the Joel Parker was able to talk and swallow without difficulty     SUBJECTIVE:     Joel Parker Statement: Joel Parker reported that his voice is functional for communication at this time. He reports that his wife has noticed that he talks "too loud." In addition to his voice, the Joel Parker notes that sometimes, others have difficulty understanding him due to his speech. The Joel Parker reports when he speaks slower, it's easier for others to understand him. The Joel Parker denies difficulty swallowing since his last appointment. He reports that it's rare for liquids to go down the wrong way now. Overall, the Joel Parker's goal for today's visit is to know the status of his vocal cords and throat.       OBJECTIVE:    Observations:   Resonance: WNL  Focus: posterior tone focus       Respiration: Poor coordination of phonation with respiration    Articulation: WNL    Other: none     Perceptual Rating Of Vocal Quality:  CAPE-V  The following parameters of voice quality will  be rated upon completion of the following tasks:  1. Sustained vowels 2. Sentence production 3. Spontaneous speech    Clinician's Perceptual Assessment using Consensus Auditory Perceptual Evaluation of Voice (CAPE-V)    Overall Severity  34/100  Constant  Roughness   28/100  Constant   Breathiness   25/100  Constant  Strain    34/100  Constant  Pitch    15/100  Constant  Nature of abnormality: decreased variation  Loudness   11/100  Constant  Nature of abnormality:  increased        Videostroboscopic Findings:    Joel Parker was then seen with: Dr. Uvaldo Bristle, laryngologist, who performed flexible videostroboscopy which revealed the following:    MUCOSA: Bowing: Moderate and Bilateral    VOCAL FOLD MOVEMENT:  Right: normal  Left: decreased     GLOTTIC CLOSURE: Incomplete: mild, moderate spindle    VIBRATION:   Periodicity: periodic     Phase: asymmetrical  Mucosal Wave:  Right: present  Left: present   Amplitude:  Right: increased  Left: increased    MUSCLE TENSION PATTERN: Lateral (Type II): bilateral; mild and moderate    Supraglottis: WNL  Subglottis: Not visualized   Tremor: No  Breaks: No  Observations with Pitch Change: Vocal folds lengthen appropriately with increased pitch.  Observations with Changes in Loudness: -    Observations with Treatment Probes: No probes administered     Velopharyngeal Competency: Complete/Within Functional Limits (WFL)  Pharyngeal Squeeze:  -  Murray Secretion Scale: Mild -1 diffuse secretions in the bilateral pharynx and posterior surface of the epiglottis  Anatomic comments: -    TREATMENT AND EDUCATION:    Anatomy and physiology of the voice and upper airway system: discussed with a focus on normal vs disordered structure/function as related to the Joel Parker's diagnosis and primary concerns. This discussion on anatomy/physiology was related back to the rationale for speech therapy intervention at this time and the Joel Parker's current plan of care. The Joel Parker was provided with the opportunity to ask questions regarding their diagnosis and plan of care, which the clinician answered.    Swallow Strategies and Pulmonary Hygiene Recommendations: discussed to target improved swallow safety and maintain pulmonary hygiene.  Eat and drink in a distraction free environment  Take one sip of liquid at a time  Use a chin tuck as needed when drinking liquid  If you feel something go down the wrong way, cough and swallow    Pulmonary Hygiene  Keep your mouth clean.  Brush your teeth and tongue at least 2-3 times per day  Stay physically active to the extent possible    Partner Communication Strategies: discussed strategies to improve one-on-one interpersonal communication. Strategies discussed include:  While speaking, use the following strategies to improve communication:   Face your communication partner so that you can both see each other's faces clearly  Speak from no further than 1-2 arms lengths apart  Eliminate or reduce background noise (ie pause music and videos on the TV, phone, tablet)   Speak slowly. Make sure you aren't rushing through your words  Pause to breath while speaking. Make sure you aren't rushing to get the words out  Speak clearly. Enunciate each sound in each word  Use an adequate or slightly increased voice volume    Education:  Plan/recommendations were reviewed with Joel Parker/caregiver.  The following were: Taught to Joel Parker/caregiver; swallow  Given in a written handout; voice and swallow    Contact information was provided for further questions.  Response: Joel Parker/caregiver verbalized understanding    Skilled Services Provided: Beh. & qualitative analysis of voice & resonance    ASSESSMENT:  See clinical impression above.     Rehabilitation potential is: Good  Potential Barriers to achieve rehab goals: none  These goals were discussed and the Joel Parker agrees with them.    DISCHARGE GOALS:  1. Maintain best quality voice for functional communication and participation in community activities:   1a. Evaluate status of vocal folds  1b. Joel Parker will participate in education of basic anatomy and physiology of vocal fold vibration     2. Joel Parker will tolerate least restrictive diet with no signs/symptoms of aspiration:    2b. Joel Parker will utilize swallow management techniques with minimal cues  2d. Joel Parker will verbalize pulmonary hygiene recommendations for maintaining pulmonary health     Goals to be met by today.    Joel Parker/Family Input to Goals: Pt.  wishes to: know status of vocal folds    PLAN OF CARE:   Frequency/Duration of Treatment: Evaluation only     This Joel Parker will recheck with the referring provider per MD recs.    The plan outlined above was formulated, reviewed, and agreed upon with the Joel Parker and/or family/caregiver.  Joel Parker/caregiver verbalized understanding and restated information in plan.    Domenica Fail, MS, SLP  Speech-Language Pathologist  Department of Otolaryngology  [facility]  Clinic: [phone]  Fax: [phone]     Referring Physician/Practitioner Certification Statement:     I Certify the need for these services furnished under this plan of treatment and while under my care.

## 2023-02-03 NOTE — Progress Notes (Signed)
Return Laryngology Visit    Subjective:     Herber Ricardez is a 77 year old male here for ongoing care of dysphonia. 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 Uvaldo Bristle in 2018. He also had a left lower lung cancer that was removed in about 2019. He was last seen a year ago.  Since then he is been talking more loudly according to what his wife has told him. He is not having any trouble with his swallow (he feels like it is safe) or his breathing. No unintentional weight loss. No lumps or bumps in his neck that he has noticed.    Objective:      Vitals:    02/03/23 1148   BP: (!) 148/94   Pulse: 77   Temp: 36.5 C   SpO2: 92%   General: A well developed male, in no acute distress.   Voice: Moderately rough voice.  Nose: Nares patent, external nose without deformity.   Oral Cavity: Tongue mobile and midline, palate raises symmetrically, dental appliance in place, tonsils symmetric.  Neck: Neck without LAD, thyroid non-palpable, laryngotracheal structures midline.   Chest: Normal chest rise bilaterally, nonlabored breathing, no stridor      Procedures(s):     PROCEDURE :    Flex videostroboscopy   INDICATION:   Hoarseness/dysphonia/hisotry of papilloma   OPERATORS:      Dr. Eustace Moore, MD  Dr. Lavena Stanford, MD  SLP: none   FINDINGS:     Nasopharynx: Right nose with thick mucous in a trail leading down to his pharynx  Palate elevation and closure:  Complete  Base of tongue, vallecula, epiglottis, and piriform sinuses clear with no evidence of recurrent papilloma, but with significant thick mucous along the laryngeal surface of the epiglottis and into the left pyriform  Subglottis: clear  Compression/ hyperfunction: Right greater than left false cord hyperfunction  Motion: Left true vocal fold mildly hypomobile, right true vocal fold fully mobile  On Stroboscopy:   Mucosa: Bilateral vocal cord thinning   Mucosal Wave:  Chaotic with a spindle shaped gap and increased amplitude L>R   Glottic Closure: Small anterior gap   DESCRIPTION OF PROCEDURE:   The procedure was explained to the patient who verbally agreed to proceed.  Afrin and 4% lidocaine was used as topical mucosal anesthetic. The flexible endoscope was passed through the right nostril with the findings as described above. The scope was gently withdrawn. The patient tolerated the procedure well without complications.  The video was reviewed with the patient and their questions were answered.     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 He is doing well with moderate dysphonia, mostly characterized by hyperfunction and bowing resulting in roughness. He is able to function well although his wife has told him that she has noticed some changes. We are reassured that there is no recurrence of papilloma. He has thick nasal secretions that are pooling on his epiglottis and in his left pyriform.     - Return to clinic in 1 year  - Nasal saline irrigation instructions given, BID with saline         [facility] ATTENDING NOTE  I, Lavena Stanford, saw and  evaluated this patient the day of their clinic visit. I have read and reviewed Dr. Adolph Pollack' note and agree.  I was present for the entire endoscopic procedure.    I have personally reviewed the history with the patient as well as going over the working diagnoses and management options.  I have answered the patient's questions to the best of my ability.

## 2023-02-03 NOTE — Patient Instructions (Addendum)
If you need further assistance, please contact us via MyChart or at our clinic number  [facility] Number: [phone]    VISIT SUMMARY    During today's visit, we looked at your vocal cords using a video exam called laryngoscopy or videostroboscopy    Here are your next steps for care:  Reach out if your voice or swallowing becomes bothersome again in the future    SWALLOW STRATEGIES  Eat and drink in a distraction free environment  Take one sip of liquid at a time  Use a chin tuck as needed when drinking liquid  If you feel something go down the wrong way, cough and swallow    PULMONARY HYGIENE  Keep your mouth clean. Brush your teeth and tongue at least 2-3 times per day  Stay physically active to the extent possible    PARTNER COMMUNICATION STRATEGIES   Face your communication partner so that you can both see each other's faces clearly  Speak from no further than 1-2 arms lengths apart  Eliminate or reduce background noise (ie pause music and videos on the TV, phone, tablet)   Speak slowly. Make sure you aren't rushing through your words  Pause to breath while speaking. Make sure you aren't rushing to get the words out  Speak clearly. Enunciate each sound in each word  Use an adequate or slightly increased voice volume

## 2023-02-06 ENCOUNTER — Ambulatory Visit (HOSPITAL_BASED_OUTPATIENT_CLINIC_OR_DEPARTMENT_OTHER): Payer: Medicare Other | Admitting: Otolaryngology

## 2024-02-02 ENCOUNTER — Encounter

## 2024-02-02 ENCOUNTER — Ambulatory Visit: Payer: Medicare Other | Attending: Otolaryngology | Admitting: Otolaryngology

## 2024-02-02 VITALS — BP 120/77 | HR 60 | Temp 97.4°F | Ht 74.0 in | Wt 230.0 lb

## 2024-02-02 DIAGNOSIS — R490 Dysphonia: Secondary | ICD-10-CM | POA: Insufficient documentation

## 2024-02-02 DIAGNOSIS — J3800 Paralysis of vocal cords and larynx, unspecified: Secondary | ICD-10-CM | POA: Insufficient documentation

## 2024-02-06 DIAGNOSIS — J3800 Paralysis of vocal cords and larynx, unspecified: Secondary | ICD-10-CM | POA: Insufficient documentation

## 2024-02-06 NOTE — Progress Notes (Signed)
 Return Laryngology Visit    Mubashir Mallek is a 78 year old male here for ongoing care of dysphonia. 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 Joetta in 2018 as well as  a left lower lung cancer that was removed in about 2019. He was last seen a year ago.   Doing well stable from a larynngology standpoint.  He is not having any trouble with his swallow or his breathing. No unintentional weight loss.     General: A well developed male, in no acute distress.   Voice: Moderately rough voice.  Nose: Nares patent, external nose without deformity.   Oral Cavity: Tongue mobile and midline, palate raises symmetrically .  Neck: Neck without LAD, thyroid non-palpable, laryngotracheal structures midline.   Chest: Normal chest rise bilaterally, nonlabored breathing, no stridor      Procedures(s):     PROCEDURE :    Flex videostroboscopy   INDICATION:   Hoarseness/dysphonia/hisotry of papilloma   OPERATORS:        Clem Joetta, MD     FINDINGS:     Nasopharynx:  Negative   Palate elevation and closure:  Complete  Base of tongue, vallecula, epiglottis, and piriform sinuses clear with no evidence of recurrent papilloma, Subglottis: clear  Compression/ hyperfunction: Right greater than left false cord hyperfunction  Motion: Left true vocal fold mildly hypomobile, right true vocal fold fully mobile  On Stroboscopy:   Mucosa: Bilateral vocal cord thinning   Mucosal Wave: Chaotic with a spindle shaped gap and increased amplitude L>R   Glottic Closure: Small anterior gap   DESCRIPTION OF PROCEDURE:   The procedure was explained to the patient who verbally agreed to proceed.  Afrin and 4% lidocaine was used as topical mucosal anesthetic. The flexible endoscope was passed through the right nostril with the findings as described above. The scope was gently withdrawn. The patient tolerated the procedure well without complications.  The video  was reviewed with the patient and their questions were answered.     Assessment and Plan:      Jiraiya Mcewan is a 78 yo male - NED.  He is able to function well although his wife has told him that she has noticed some changes. We are reassured that there is no recurrence of papilloma.    - Return to clinic in 1 year option for prn given, prefers annual check given his history.

## 2025-02-07 ENCOUNTER — Ambulatory Visit: Admitting: Otolaryngology

## 7363-09-23 DEATH — deceased
